# Patient Record
Sex: Male | Born: 1945 | Race: White | Hispanic: No | Smoking: Former smoker
Health system: Southern US, Community
[De-identification: ages and names within clinical notes are randomized; demographics above are authoritative.]

## PROBLEM LIST (undated history)

## (undated) DIAGNOSIS — I350 Nonrheumatic aortic (valve) stenosis: Secondary | ICD-10-CM

## (undated) DIAGNOSIS — J189 Pneumonia, unspecified organism: Secondary | ICD-10-CM

## (undated) DIAGNOSIS — M109 Gout, unspecified: Secondary | ICD-10-CM

## (undated) DIAGNOSIS — I2510 Atherosclerotic heart disease of native coronary artery without angina pectoris: Secondary | ICD-10-CM

## (undated) DIAGNOSIS — E785 Hyperlipidemia, unspecified: Secondary | ICD-10-CM

## (undated) DIAGNOSIS — N189 Chronic kidney disease, unspecified: Secondary | ICD-10-CM

## (undated) DIAGNOSIS — E119 Type 2 diabetes mellitus without complications: Secondary | ICD-10-CM

## (undated) DIAGNOSIS — R0602 Shortness of breath: Secondary | ICD-10-CM

## (undated) DIAGNOSIS — D759 Disease of blood and blood-forming organs, unspecified: Secondary | ICD-10-CM

## (undated) DIAGNOSIS — M1990 Unspecified osteoarthritis, unspecified site: Secondary | ICD-10-CM

## (undated) DIAGNOSIS — I714 Abdominal aortic aneurysm, without rupture, unspecified: Secondary | ICD-10-CM

## (undated) DIAGNOSIS — I8290 Acute embolism and thrombosis of unspecified vein: Secondary | ICD-10-CM

## (undated) DIAGNOSIS — R0600 Dyspnea, unspecified: Secondary | ICD-10-CM

## (undated) DIAGNOSIS — I10 Essential (primary) hypertension: Secondary | ICD-10-CM

## (undated) DIAGNOSIS — I739 Peripheral vascular disease, unspecified: Secondary | ICD-10-CM

## (undated) DIAGNOSIS — J449 Chronic obstructive pulmonary disease, unspecified: Secondary | ICD-10-CM

## (undated) HISTORY — DX: Chronic obstructive pulmonary disease, unspecified: J44.9

## (undated) HISTORY — DX: Nonrheumatic aortic (valve) stenosis: I35.0

## (undated) HISTORY — DX: Gout, unspecified: M10.9

## (undated) HISTORY — DX: Hyperlipidemia, unspecified: E78.5

## (undated) HISTORY — DX: Peripheral vascular disease, unspecified: I73.9

## (undated) HISTORY — PX: SHOULDER SURGERY: SHX246

## (undated) HISTORY — DX: Abdominal aortic aneurysm, without rupture: I71.4

## (undated) HISTORY — PX: BACK SURGERY: SHX140

## (undated) HISTORY — PX: APPENDECTOMY: SHX54

## (undated) HISTORY — DX: Essential (primary) hypertension: I10

## (undated) HISTORY — DX: Unspecified osteoarthritis, unspecified site: M19.90

## (undated) HISTORY — PX: EYE SURGERY: SHX253

## (undated) HISTORY — DX: Acute embolism and thrombosis of unspecified vein: I82.90

## (undated) HISTORY — DX: Abdominal aortic aneurysm, without rupture, unspecified: I71.40

## (undated) HISTORY — PX: HERNIA REPAIR: SHX51

## (undated) HISTORY — DX: Atherosclerotic heart disease of native coronary artery without angina pectoris: I25.10

---

## 1998-08-20 ENCOUNTER — Encounter: Payer: Self-pay | Admitting: Internal Medicine

## 1998-08-20 ENCOUNTER — Ambulatory Visit (HOSPITAL_COMMUNITY): Admission: RE | Admit: 1998-08-20 | Discharge: 1998-08-20 | Payer: Self-pay | Admitting: Internal Medicine

## 1999-10-05 ENCOUNTER — Ambulatory Visit (HOSPITAL_COMMUNITY): Admission: RE | Admit: 1999-10-05 | Discharge: 1999-10-05 | Payer: Self-pay | Admitting: Internal Medicine

## 1999-10-05 ENCOUNTER — Encounter: Payer: Self-pay | Admitting: Internal Medicine

## 1999-10-15 ENCOUNTER — Encounter: Payer: Self-pay | Admitting: Internal Medicine

## 1999-10-15 ENCOUNTER — Ambulatory Visit (HOSPITAL_COMMUNITY): Admission: RE | Admit: 1999-10-15 | Discharge: 1999-10-15 | Payer: Self-pay | Admitting: Internal Medicine

## 2000-04-12 ENCOUNTER — Encounter: Payer: Self-pay | Admitting: General Surgery

## 2000-04-13 ENCOUNTER — Ambulatory Visit (HOSPITAL_COMMUNITY): Admission: RE | Admit: 2000-04-13 | Discharge: 2000-04-13 | Payer: Self-pay | Admitting: General Surgery

## 2001-01-25 ENCOUNTER — Ambulatory Visit (HOSPITAL_COMMUNITY): Admission: RE | Admit: 2001-01-25 | Discharge: 2001-01-25 | Payer: Self-pay | Admitting: Oncology

## 2002-09-26 ENCOUNTER — Encounter: Payer: Self-pay | Admitting: Oncology

## 2002-09-26 ENCOUNTER — Inpatient Hospital Stay (HOSPITAL_COMMUNITY): Admission: EM | Admit: 2002-09-26 | Discharge: 2002-09-27 | Payer: Self-pay | Admitting: Oncology

## 2003-05-15 ENCOUNTER — Encounter: Payer: Self-pay | Admitting: Oncology

## 2003-05-15 ENCOUNTER — Ambulatory Visit (HOSPITAL_COMMUNITY): Admission: RE | Admit: 2003-05-15 | Discharge: 2003-05-15 | Payer: Self-pay | Admitting: Oncology

## 2004-06-08 ENCOUNTER — Ambulatory Visit (HOSPITAL_COMMUNITY): Admission: RE | Admit: 2004-06-08 | Discharge: 2004-06-08 | Payer: Self-pay | Admitting: Internal Medicine

## 2004-07-08 ENCOUNTER — Ambulatory Visit: Payer: Self-pay | Admitting: *Deleted

## 2004-08-11 ENCOUNTER — Encounter: Admission: RE | Admit: 2004-08-11 | Discharge: 2004-08-11 | Payer: Self-pay | Admitting: *Deleted

## 2004-08-12 ENCOUNTER — Ambulatory Visit (HOSPITAL_COMMUNITY): Admission: RE | Admit: 2004-08-12 | Discharge: 2004-08-12 | Payer: Self-pay | Admitting: *Deleted

## 2004-08-12 ENCOUNTER — Ambulatory Visit (HOSPITAL_BASED_OUTPATIENT_CLINIC_OR_DEPARTMENT_OTHER): Admission: RE | Admit: 2004-08-12 | Discharge: 2004-08-12 | Payer: Self-pay | Admitting: *Deleted

## 2004-08-19 ENCOUNTER — Ambulatory Visit: Payer: Self-pay | Admitting: Oncology

## 2004-10-29 ENCOUNTER — Ambulatory Visit: Payer: Self-pay | Admitting: Oncology

## 2004-12-23 ENCOUNTER — Ambulatory Visit: Payer: Self-pay | Admitting: Oncology

## 2004-12-30 ENCOUNTER — Ambulatory Visit: Payer: Self-pay | Admitting: Internal Medicine

## 2005-03-03 ENCOUNTER — Ambulatory Visit: Payer: Self-pay | Admitting: Oncology

## 2005-04-23 ENCOUNTER — Ambulatory Visit: Payer: Self-pay | Admitting: Oncology

## 2005-06-30 ENCOUNTER — Ambulatory Visit: Payer: Self-pay | Admitting: Oncology

## 2005-08-13 ENCOUNTER — Ambulatory Visit: Payer: Self-pay | Admitting: Internal Medicine

## 2005-08-18 ENCOUNTER — Ambulatory Visit: Payer: Self-pay | Admitting: Oncology

## 2005-10-27 ENCOUNTER — Ambulatory Visit: Payer: Self-pay | Admitting: Oncology

## 2005-12-22 ENCOUNTER — Ambulatory Visit: Payer: Self-pay | Admitting: Oncology

## 2006-01-20 LAB — PROTIME-INR

## 2006-02-15 ENCOUNTER — Ambulatory Visit: Payer: Self-pay | Admitting: Oncology

## 2006-02-17 LAB — PROTIME-INR
INR: 1.8 — ABNORMAL LOW (ref 2.00–3.50)
Protime: 16.5 Seconds — ABNORMAL HIGH (ref 10.6–13.4)

## 2006-03-02 LAB — PROTHROMBIN TIME
INR: 1.2 (ref 0.0–1.5)
Prothrombin Time: 15.8 seconds — ABNORMAL HIGH (ref 11.6–15.2)

## 2006-03-07 LAB — PROTIME-INR
INR: 2.2 (ref 2.00–3.50)
Protime: 18.2 Seconds — ABNORMAL HIGH (ref 10.6–13.4)

## 2006-03-11 LAB — PROTIME-INR
INR: 1.9 — ABNORMAL LOW (ref 2.00–3.50)
Protime: 17.1 Seconds — ABNORMAL HIGH (ref 10.6–13.4)

## 2006-04-21 ENCOUNTER — Ambulatory Visit: Payer: Self-pay | Admitting: Oncology

## 2006-04-21 LAB — PROTIME-INR: Protime: 22.8 Seconds — ABNORMAL HIGH (ref 10.6–13.4)

## 2006-04-28 LAB — PROTHROMBIN TIME
INR: 3.7 — ABNORMAL HIGH (ref 0.0–1.5)
Prothrombin Time: 36.3 seconds — ABNORMAL HIGH (ref 11.6–15.2)

## 2006-04-28 LAB — PROTIME-INR

## 2006-05-11 LAB — PROTIME-INR
INR: 3 (ref 2.00–3.50)
Protime: 20.9 Seconds — ABNORMAL HIGH (ref 10.6–13.4)

## 2006-05-26 LAB — PROTIME-INR
INR: 2.9 (ref 2.00–3.50)
Protime: 20.5 Seconds — ABNORMAL HIGH (ref 10.6–13.4)

## 2006-06-08 ENCOUNTER — Ambulatory Visit: Payer: Self-pay | Admitting: Oncology

## 2006-06-09 LAB — PROTIME-INR

## 2006-07-14 LAB — PROTIME-INR

## 2006-08-16 ENCOUNTER — Ambulatory Visit: Payer: Self-pay | Admitting: Oncology

## 2006-08-18 LAB — PROTIME-INR: INR: 2.2 (ref 2.00–3.50)

## 2006-09-13 ENCOUNTER — Ambulatory Visit: Payer: Self-pay | Admitting: Oncology

## 2006-10-13 LAB — PROTIME-INR: INR: 2.6 (ref 2.00–3.50)

## 2006-11-07 ENCOUNTER — Ambulatory Visit: Payer: Self-pay | Admitting: Oncology

## 2006-11-10 LAB — PROTIME-INR: INR: 2.6 (ref 2.00–3.50)

## 2007-01-03 ENCOUNTER — Ambulatory Visit: Payer: Self-pay | Admitting: Oncology

## 2007-01-05 LAB — PROTIME-INR

## 2007-02-02 LAB — PROTIME-INR
INR: 2.3 (ref 2.00–3.50)
Protime: 27.6 Seconds — ABNORMAL HIGH (ref 10.6–13.4)

## 2007-02-28 ENCOUNTER — Ambulatory Visit: Payer: Self-pay | Admitting: Oncology

## 2007-03-02 LAB — PROTIME-INR: Protime: 21.6 Seconds — ABNORMAL HIGH (ref 10.6–13.4)

## 2007-03-30 LAB — PROTIME-INR: INR: 1.5 — ABNORMAL LOW (ref 2.00–3.50)

## 2007-04-25 ENCOUNTER — Ambulatory Visit: Payer: Self-pay | Admitting: Oncology

## 2007-04-26 LAB — PROTIME-INR: INR: 2.1 (ref 2.00–3.50)

## 2007-05-22 LAB — PROTIME-INR: Protime: 16.8 Seconds — ABNORMAL HIGH (ref 10.6–13.4)

## 2007-06-16 ENCOUNTER — Ambulatory Visit: Payer: Self-pay | Admitting: Internal Medicine

## 2007-07-19 LAB — PROTIME-INR
INR: 2.1 (ref 2.00–3.50)
Protime: 25.2 Seconds — ABNORMAL HIGH (ref 10.6–13.4)

## 2007-08-14 ENCOUNTER — Ambulatory Visit: Payer: Self-pay | Admitting: Internal Medicine

## 2007-08-16 LAB — PROTIME-INR
INR: 1.8 — ABNORMAL LOW (ref 2.00–3.50)
Protime: 21.6 Seconds — ABNORMAL HIGH (ref 10.6–13.4)

## 2007-09-13 LAB — PROTIME-INR

## 2007-10-09 ENCOUNTER — Ambulatory Visit: Payer: Self-pay | Admitting: Internal Medicine

## 2007-10-11 LAB — PROTIME-INR

## 2007-11-08 LAB — PROTIME-INR

## 2007-11-15 ENCOUNTER — Ambulatory Visit (HOSPITAL_COMMUNITY): Admission: RE | Admit: 2007-11-15 | Discharge: 2007-11-15 | Payer: Self-pay | Admitting: Cardiology

## 2007-12-04 ENCOUNTER — Ambulatory Visit: Payer: Self-pay | Admitting: Internal Medicine

## 2007-12-06 LAB — PROTIME-INR: Protime: 42 Seconds — ABNORMAL HIGH (ref 10.6–13.4)

## 2008-01-03 LAB — PROTIME-INR
INR: 3.3 (ref 2.00–3.50)
Protime: 39.6 Seconds — ABNORMAL HIGH (ref 10.6–13.4)

## 2008-01-18 ENCOUNTER — Ambulatory Visit: Payer: Self-pay | Admitting: Internal Medicine

## 2008-01-22 LAB — PROTIME-INR

## 2008-02-21 LAB — PROTIME-INR
INR: 2.1 (ref 2.00–3.50)
Protime: 25.2 Seconds — ABNORMAL HIGH (ref 10.6–13.4)

## 2008-03-18 ENCOUNTER — Ambulatory Visit: Payer: Self-pay | Admitting: Internal Medicine

## 2008-03-20 LAB — PROTIME-INR
INR: 2.3 (ref 2.00–3.50)
Protime: 27.6 Seconds — ABNORMAL HIGH (ref 10.6–13.4)

## 2008-05-10 ENCOUNTER — Ambulatory Visit: Payer: Self-pay | Admitting: Internal Medicine

## 2008-06-12 LAB — PROTIME-INR: Protime: 42 Seconds — ABNORMAL HIGH (ref 10.6–13.4)

## 2008-06-19 LAB — PROTIME-INR
INR: 2.7 (ref 2.00–3.50)
Protime: 32.4 Seconds — ABNORMAL HIGH (ref 10.6–13.4)

## 2008-07-08 ENCOUNTER — Ambulatory Visit: Payer: Self-pay | Admitting: Internal Medicine

## 2008-07-10 LAB — PROTIME-INR: INR: 2.5 (ref 2.00–3.50)

## 2008-08-19 LAB — PROTIME-INR
INR: 2 (ref 2.00–3.50)
Protime: 24 s — ABNORMAL HIGH (ref 10.6–13.4)

## 2008-09-10 ENCOUNTER — Ambulatory Visit: Payer: Self-pay | Admitting: Internal Medicine

## 2008-09-16 LAB — PROTIME-INR
INR: 2.8 (ref 2.00–3.50)
Protime: 33.6 Seconds — ABNORMAL HIGH (ref 10.6–13.4)

## 2008-10-28 ENCOUNTER — Ambulatory Visit: Payer: Self-pay | Admitting: Internal Medicine

## 2008-10-28 LAB — PROTIME-INR: INR: 3.3 (ref 2.00–3.50)

## 2008-11-13 LAB — PROTIME-INR: INR: 1.9 — ABNORMAL LOW (ref 2.00–3.50)

## 2008-12-11 LAB — PROTIME-INR: INR: 2.4 (ref 2.00–3.50)

## 2009-01-06 ENCOUNTER — Ambulatory Visit: Payer: Self-pay | Admitting: Internal Medicine

## 2009-01-08 LAB — PROTIME-INR: INR: 2.6 (ref 2.00–3.50)

## 2009-02-05 LAB — PROTIME-INR: Protime: 31.2 Seconds — ABNORMAL HIGH (ref 10.6–13.4)

## 2009-03-03 ENCOUNTER — Ambulatory Visit: Payer: Self-pay | Admitting: Internal Medicine

## 2009-03-05 LAB — PROTIME-INR
INR: 1.9 — ABNORMAL LOW (ref 2.00–3.50)
Protime: 22.8 Seconds — ABNORMAL HIGH (ref 10.6–13.4)

## 2009-04-02 LAB — PROTIME-INR

## 2009-04-28 ENCOUNTER — Ambulatory Visit: Payer: Self-pay | Admitting: Internal Medicine

## 2009-04-30 LAB — PROTIME-INR

## 2009-05-26 ENCOUNTER — Ambulatory Visit: Payer: Self-pay | Admitting: Internal Medicine

## 2009-05-26 LAB — PROTIME-INR: INR: 2.8 (ref 2.00–3.50)

## 2009-06-25 ENCOUNTER — Ambulatory Visit: Payer: Self-pay | Admitting: Internal Medicine

## 2009-06-25 LAB — CBC WITH DIFFERENTIAL/PLATELET
Basophils Absolute: 0 10*3/uL (ref 0.0–0.1)
EOS%: 3.2 % (ref 0.0–7.0)
Eosinophils Absolute: 0.3 10*3/uL (ref 0.0–0.5)
HCT: 38.9 % (ref 38.4–49.9)
HGB: 13.6 g/dL (ref 13.0–17.1)
LYMPH%: 16.9 % (ref 14.0–49.0)
MCH: 32.4 pg (ref 27.2–33.4)
MCV: 92.3 fL (ref 79.3–98.0)
MONO%: 9 % (ref 0.0–14.0)
NEUT#: 5.7 10*3/uL (ref 1.5–6.5)
NEUT%: 70.6 % (ref 39.0–75.0)
Platelets: 279 10*3/uL (ref 140–400)
RDW: 17.7 % — ABNORMAL HIGH (ref 11.0–14.6)

## 2009-06-25 LAB — COMPREHENSIVE METABOLIC PANEL
AST: 10 U/L (ref 0–37)
Albumin: 4.2 g/dL (ref 3.5–5.2)
BUN: 12 mg/dL (ref 6–23)
CO2: 15 mEq/L — ABNORMAL LOW (ref 19–32)
Calcium: 8.6 mg/dL (ref 8.4–10.5)
Chloride: 109 mEq/L (ref 96–112)
Creatinine, Ser: 1.28 mg/dL (ref 0.40–1.50)
Glucose, Bld: 78 mg/dL (ref 70–99)
Potassium: 4.7 mEq/L (ref 3.5–5.3)

## 2009-06-25 LAB — TSH: TSH: 2.226 u[IU]/mL (ref 0.350–4.500)

## 2009-06-25 LAB — PROTIME-INR: INR: 3.1 (ref 2.00–3.50)

## 2009-07-23 LAB — BASIC METABOLIC PANEL
CO2: 20 mEq/L (ref 19–32)
Chloride: 108 mEq/L (ref 96–112)
Creatinine, Ser: 1.06 mg/dL (ref 0.40–1.50)
Potassium: 4.2 mEq/L (ref 3.5–5.3)

## 2009-07-23 LAB — PROTIME-INR: INR: 1.8 — ABNORMAL LOW (ref 2.00–3.50)

## 2009-08-18 ENCOUNTER — Ambulatory Visit: Payer: Self-pay | Admitting: Internal Medicine

## 2009-08-20 LAB — PROTIME-INR: INR: 2.8 (ref 2.00–3.50)

## 2009-09-17 ENCOUNTER — Ambulatory Visit: Payer: Self-pay | Admitting: Internal Medicine

## 2009-09-17 LAB — PROTIME-INR
INR: 2.9 (ref 2.00–3.50)
Protime: 34.8 Seconds — ABNORMAL HIGH (ref 10.6–13.4)

## 2009-10-20 ENCOUNTER — Ambulatory Visit: Payer: Self-pay | Admitting: Internal Medicine

## 2009-10-22 LAB — PROTIME-INR: Protime: 19.2 Seconds — ABNORMAL HIGH (ref 10.6–13.4)

## 2009-11-18 LAB — CBC WITH DIFFERENTIAL/PLATELET
Basophils Absolute: 0 10*3/uL (ref 0.0–0.1)
EOS%: 3.3 % (ref 0.0–7.0)
LYMPH%: 8.8 % — ABNORMAL LOW (ref 14.0–49.0)
MCH: 31.3 pg (ref 27.2–33.4)
MCV: 90.9 fL (ref 79.3–98.0)
MONO%: 10.3 % (ref 0.0–14.0)
Platelets: 222 10*3/uL (ref 140–400)
RBC: 4.64 10*6/uL (ref 4.20–5.82)
RDW: 15.7 % — ABNORMAL HIGH (ref 11.0–14.6)
nRBC: 0 % (ref 0–0)

## 2009-11-18 LAB — PROTIME-INR: Protime: 20.4 Seconds — ABNORMAL HIGH (ref 10.6–13.4)

## 2009-11-23 ENCOUNTER — Inpatient Hospital Stay (HOSPITAL_COMMUNITY): Admission: EM | Admit: 2009-11-23 | Discharge: 2009-11-26 | Payer: Self-pay | Admitting: Emergency Medicine

## 2009-12-01 ENCOUNTER — Ambulatory Visit: Payer: Self-pay | Admitting: Internal Medicine

## 2009-12-01 LAB — PROTIME-INR
INR: 2.9 (ref 2.00–3.50)
Protime: 34.8 Seconds — ABNORMAL HIGH (ref 10.6–13.4)

## 2009-12-08 LAB — PROTIME-INR
INR: 1.3 — ABNORMAL LOW (ref 2.00–3.50)
Protime: 15.6 Seconds — ABNORMAL HIGH (ref 10.6–13.4)

## 2009-12-17 LAB — PROTIME-INR
INR: 2.3 (ref 2.00–3.50)
Protime: 27.6 Seconds — ABNORMAL HIGH (ref 10.6–13.4)

## 2010-01-19 ENCOUNTER — Ambulatory Visit: Payer: Self-pay | Admitting: Internal Medicine

## 2010-01-21 LAB — PROTIME-INR
INR: 2.6 (ref 2.00–3.50)
Protime: 31.2 Seconds — ABNORMAL HIGH (ref 10.6–13.4)

## 2010-02-20 ENCOUNTER — Ambulatory Visit: Payer: Self-pay | Admitting: Internal Medicine

## 2010-02-20 ENCOUNTER — Inpatient Hospital Stay (HOSPITAL_COMMUNITY): Admission: EM | Admit: 2010-02-20 | Discharge: 2010-02-21 | Payer: Self-pay | Admitting: Emergency Medicine

## 2010-02-20 ENCOUNTER — Encounter (INDEPENDENT_AMBULATORY_CARE_PROVIDER_SITE_OTHER): Payer: Self-pay | Admitting: Internal Medicine

## 2010-02-25 ENCOUNTER — Ambulatory Visit: Payer: Self-pay | Admitting: Internal Medicine

## 2010-03-10 LAB — PROTIME-INR

## 2010-03-18 LAB — PROTIME-INR
INR: 2.5 (ref 2.00–3.50)
Protime: 30 Seconds — ABNORMAL HIGH (ref 10.6–13.4)

## 2010-04-17 ENCOUNTER — Ambulatory Visit: Payer: Self-pay | Admitting: Internal Medicine

## 2010-05-06 LAB — PROTIME-INR: INR: 1.3 — ABNORMAL LOW (ref 2.00–3.50)

## 2010-05-19 ENCOUNTER — Ambulatory Visit: Payer: Self-pay | Admitting: Internal Medicine

## 2010-06-10 LAB — PROTIME-INR: Protime: 38.4 Seconds — ABNORMAL HIGH (ref 10.6–13.4)

## 2010-06-24 ENCOUNTER — Ambulatory Visit: Payer: Self-pay | Admitting: Internal Medicine

## 2010-06-24 LAB — PROTIME-INR

## 2010-07-15 LAB — PROTIME-INR
INR: 3.3 (ref 2.00–3.50)
Protime: 39.6 Seconds — ABNORMAL HIGH (ref 10.6–13.4)

## 2010-08-10 ENCOUNTER — Ambulatory Visit: Payer: Self-pay | Admitting: Internal Medicine

## 2010-08-12 LAB — PROTIME-INR
INR: 3.2 (ref 2.00–3.50)
Protime: 38.4 Seconds — ABNORMAL HIGH (ref 10.6–13.4)

## 2010-09-09 ENCOUNTER — Ambulatory Visit: Payer: Self-pay | Admitting: Internal Medicine

## 2010-09-09 LAB — PROTHROMBIN TIME
INR: 3.68 — ABNORMAL HIGH (ref <1.50)
Prothrombin Time: 36.5 seconds — ABNORMAL HIGH (ref 11.6–15.2)

## 2010-09-09 LAB — PROTIME-INR

## 2010-09-16 LAB — PROTIME-INR

## 2010-10-07 LAB — PROTIME-INR

## 2010-10-08 ENCOUNTER — Ambulatory Visit: Payer: Self-pay | Admitting: Internal Medicine

## 2010-10-09 LAB — PROTIME-INR

## 2010-10-23 LAB — PROTIME-INR
INR: 2.7 (ref 2.00–3.50)
Protime: 32.4 Seconds — ABNORMAL HIGH (ref 10.6–13.4)

## 2010-11-08 ENCOUNTER — Encounter: Payer: Self-pay | Admitting: Cardiology

## 2010-11-13 ENCOUNTER — Ambulatory Visit: Payer: Self-pay | Admitting: Oncology

## 2010-11-17 LAB — PROTIME-INR: INR: 2.3 (ref 2.00–3.50)
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
## 2010-12-16 ENCOUNTER — Encounter (HOSPITAL_BASED_OUTPATIENT_CLINIC_OR_DEPARTMENT_OTHER): Payer: Self-pay | Admitting: Oncology

## 2010-12-16 ENCOUNTER — Other Ambulatory Visit: Payer: Self-pay | Admitting: Oncology

## 2010-12-16 DIAGNOSIS — Z7901 Long term (current) use of anticoagulants: Secondary | ICD-10-CM

## 2010-12-16 DIAGNOSIS — I10 Essential (primary) hypertension: Secondary | ICD-10-CM

## 2010-12-16 DIAGNOSIS — R5381 Other malaise: Secondary | ICD-10-CM

## 2010-12-16 DIAGNOSIS — Z86718 Personal history of other venous thrombosis and embolism: Secondary | ICD-10-CM

## 2010-12-16 LAB — PROTIME-INR

## 2011-01-05 LAB — COMPREHENSIVE METABOLIC PANEL
ALT: 12 U/L (ref 0–53)
ALT: 9 U/L (ref 0–53)
AST: 12 U/L (ref 0–37)
AST: 13 U/L (ref 0–37)
Albumin: 3.5 g/dL (ref 3.5–5.2)
Albumin: 4.3 g/dL (ref 3.5–5.2)
Alkaline Phosphatase: 84 U/L (ref 39–117)
CO2: 18 mEq/L — ABNORMAL LOW (ref 19–32)
Chloride: 103 mEq/L (ref 96–112)
Creatinine, Ser: 1.67 mg/dL — ABNORMAL HIGH (ref 0.4–1.5)
GFR calc Af Amer: 57 mL/min — ABNORMAL LOW (ref >60)
GFR calc non Af Amer: 42 mL/min — ABNORMAL LOW (ref >60)
Glucose, Bld: 110 mg/dL — ABNORMAL HIGH (ref 70–99)
Potassium: 4.4 mEq/L (ref 3.5–5.1)
Potassium: 4.6 mEq/L (ref 3.5–5.1)
Sodium: 132 mEq/L — ABNORMAL LOW (ref 135–145)
Sodium: 136 mEq/L (ref 135–145)
Total Bilirubin: 1.2 mg/dL (ref 0.3–1.2)
Total Protein: 6.7 g/dL (ref 6.0–8.3)

## 2011-01-05 LAB — URINALYSIS, ROUTINE W REFLEX MICROSCOPIC
Bilirubin Urine: NEGATIVE
Glucose, UA: NEGATIVE mg/dL
Nitrite: NEGATIVE
Specific Gravity, Urine: 1.013 (ref 1.005–1.030)
pH: 6 (ref 5.0–8.0)

## 2011-01-05 LAB — TROPONIN I

## 2011-01-05 LAB — CBC
HCT: 43.4 % (ref 39.0–52.0)
Hemoglobin: 15.3 g/dL (ref 13.0–17.0)
MCHC: 35 g/dL (ref 30.0–36.0)
MCV: 98 fL (ref 78.0–100.0)
RBC: 3.46 MIL/uL — ABNORMAL LOW (ref 4.22–5.81)
RBC: 4.46 MIL/uL (ref 4.22–5.81)
RDW: 15.5 % (ref 11.5–15.5)
WBC: 12.7 10*3/uL — ABNORMAL HIGH (ref 4.0–10.5)

## 2011-01-05 LAB — CARDIAC PANEL(CRET KIN+CKTOT+MB+TROPI)
Relative Index: INVALID (ref 0.0–2.5)
Total CK: 36 U/L (ref 7–232)
Troponin I: <0.01 ng/mL (ref 0.00–0.06)

## 2011-01-05 LAB — DIFFERENTIAL
Basophils Absolute: 0 10*3/uL (ref 0.0–0.1)
Basophils Relative: 0 % (ref 0–1)
Eosinophils Relative: 2 % (ref 0–5)
Monocytes Absolute: 0.9 10*3/uL (ref 0.1–1.0)
Monocytes Relative: 7 % (ref 3–12)
Neutro Abs: 10 10*3/uL — ABNORMAL HIGH (ref 1.7–7.7)

## 2011-01-05 LAB — ETHANOL

## 2011-01-05 LAB — RAPID URINE DRUG SCREEN, HOSP PERFORMED
Amphetamines: NOT DETECTED
Benzodiazepines: NOT DETECTED
Cocaine: NOT DETECTED

## 2011-01-05 LAB — CULTURE, BLOOD (ROUTINE X 2): Culture: NO GROWTH

## 2011-01-05 LAB — CK TOTAL AND CKMB (NOT AT ARMC): CK, MB: 1.7 ng/mL (ref 0.3–4.0)

## 2011-01-05 LAB — PROTIME-INR: Prothrombin Time: 20 seconds — ABNORMAL HIGH (ref 11.6–15.2)

## 2011-01-08 LAB — COMPREHENSIVE METABOLIC PANEL
Albumin: 3.7 g/dL (ref 3.5–5.2)
BUN: 29 mg/dL — ABNORMAL HIGH (ref 6–23)
Calcium: 8.9 mg/dL (ref 8.4–10.5)
Creatinine, Ser: 1.61 mg/dL — ABNORMAL HIGH (ref 0.4–1.5)
Glucose, Bld: 93 mg/dL (ref 70–99)
Total Protein: 8 g/dL (ref 6.0–8.3)

## 2011-01-08 LAB — CBC
HCT: 38 % — ABNORMAL LOW (ref 39.0–52.0)
HCT: 45 % (ref 39.0–52.0)
MCHC: 34.6 g/dL (ref 30.0–36.0)
MCHC: 35.1 g/dL (ref 30.0–36.0)
MCV: 91.9 fL (ref 78.0–100.0)
MCV: 92.2 fL (ref 78.0–100.0)
MCV: 93.1 fL (ref 78.0–100.0)
Platelets: 181 10*3/uL (ref 150–400)
Platelets: 197 10*3/uL (ref 150–400)
Platelets: 205 10*3/uL (ref 150–400)
RBC: 4.13 MIL/uL — ABNORMAL LOW (ref 4.22–5.81)
RDW: 16.9 % — ABNORMAL HIGH (ref 11.5–15.5)
RDW: 17 % — ABNORMAL HIGH (ref 11.5–15.5)
WBC: 4.3 10*3/uL (ref 4.0–10.5)

## 2011-01-08 LAB — BASIC METABOLIC PANEL
BUN: 18 mg/dL (ref 6–23)
BUN: 8 mg/dL (ref 6–23)
CO2: 23 mEq/L (ref 19–32)
Calcium: 8.3 mg/dL — ABNORMAL LOW (ref 8.4–10.5)
Calcium: 9 mg/dL (ref 8.4–10.5)
Chloride: 104 mEq/L (ref 96–112)
Chloride: 105 mEq/L (ref 96–112)
Creatinine, Ser: 0.8 mg/dL (ref 0.4–1.5)
Creatinine, Ser: 1.18 mg/dL (ref 0.4–1.5)
GFR calc Af Amer: >60 mL/min (ref >60)
GFR calc Af Amer: >60 mL/min (ref >60)
GFR calc non Af Amer: >60 mL/min (ref >60)
GFR calc non Af Amer: >60 mL/min (ref >60)
GFR calc non Af Amer: >60 mL/min (ref >60)
Glucose, Bld: 72 mg/dL (ref 70–99)
Potassium: 4.2 mEq/L (ref 3.5–5.1)
Sodium: 131 mEq/L — ABNORMAL LOW (ref 135–145)
Sodium: 135 mEq/L (ref 135–145)

## 2011-01-08 LAB — RAPID STREP SCREEN (MED CTR MEBANE ONLY): Streptococcus, Group A Screen (Direct): NEGATIVE

## 2011-01-08 LAB — GLUCOSE, CAPILLARY

## 2011-01-08 LAB — PROTIME-INR
INR: 1.89 — ABNORMAL HIGH (ref 0.00–1.49)
INR: 2.78 — ABNORMAL HIGH (ref 0.00–1.49)
INR: 3.42 — ABNORMAL HIGH (ref 0.00–1.49)
Prothrombin Time: 21.5 seconds — ABNORMAL HIGH (ref 11.6–15.2)
Prothrombin Time: 29.1 seconds — ABNORMAL HIGH (ref 11.6–15.2)
Prothrombin Time: 34.2 seconds — ABNORMAL HIGH (ref 11.6–15.2)

## 2011-01-08 LAB — URINALYSIS, ROUTINE W REFLEX MICROSCOPIC
Glucose, UA: NEGATIVE mg/dL
Hgb urine dipstick: NEGATIVE
Ketones, ur: NEGATIVE mg/dL
Leukocytes, UA: NEGATIVE
Protein, ur: 30 mg/dL — AB
Urobilinogen, UA: 1 mg/dL (ref 0.0–1.0)

## 2011-01-08 LAB — URINE MICROSCOPIC-ADD ON

## 2011-01-08 LAB — APTT: aPTT: 59 seconds — ABNORMAL HIGH (ref 24–37)

## 2011-01-08 LAB — OSMOLALITY, URINE: Osmolality, Ur: 285 mOsm/kg — ABNORMAL LOW (ref 390–1090)

## 2011-01-08 LAB — URINE CULTURE
Colony Count: NO GROWTH
Culture: NO GROWTH

## 2011-01-08 LAB — DIFFERENTIAL
Lymphocytes Relative: 10 % — ABNORMAL LOW (ref 12–46)
Monocytes Absolute: 1 10*3/uL (ref 0.1–1.0)
Monocytes Relative: 12 % (ref 3–12)
Neutro Abs: 6.7 10*3/uL (ref 1.7–7.7)
Neutrophils Relative %: 79 % — ABNORMAL HIGH (ref 43–77)

## 2011-01-08 LAB — POCT CARDIAC MARKERS
CKMB, poc: 2.2 ng/mL (ref 1.0–8.0)
Myoglobin, poc: 395 ng/mL (ref 12–200)

## 2011-01-13 ENCOUNTER — Other Ambulatory Visit: Payer: Self-pay | Admitting: Oncology

## 2011-01-13 ENCOUNTER — Encounter (HOSPITAL_BASED_OUTPATIENT_CLINIC_OR_DEPARTMENT_OTHER): Payer: Medicare Other | Admitting: Oncology

## 2011-01-13 DIAGNOSIS — Z7901 Long term (current) use of anticoagulants: Secondary | ICD-10-CM

## 2011-01-13 DIAGNOSIS — Z86718 Personal history of other venous thrombosis and embolism: Secondary | ICD-10-CM

## 2011-01-13 DIAGNOSIS — I10 Essential (primary) hypertension: Secondary | ICD-10-CM

## 2011-01-13 DIAGNOSIS — R5381 Other malaise: Secondary | ICD-10-CM

## 2011-01-13 LAB — PROTIME-INR: Protime: 24 Seconds — ABNORMAL HIGH (ref 10.6–13.4)

## 2011-02-10 ENCOUNTER — Other Ambulatory Visit: Payer: Self-pay | Admitting: Oncology

## 2011-02-10 ENCOUNTER — Encounter (HOSPITAL_BASED_OUTPATIENT_CLINIC_OR_DEPARTMENT_OTHER): Payer: Medicare Other | Admitting: Oncology

## 2011-02-10 DIAGNOSIS — I10 Essential (primary) hypertension: Secondary | ICD-10-CM

## 2011-02-10 DIAGNOSIS — Z86718 Personal history of other venous thrombosis and embolism: Secondary | ICD-10-CM

## 2011-02-10 DIAGNOSIS — Z7901 Long term (current) use of anticoagulants: Secondary | ICD-10-CM

## 2011-02-10 DIAGNOSIS — R5381 Other malaise: Secondary | ICD-10-CM

## 2011-02-10 LAB — PROTIME-INR: INR: 2.1 (ref 2.00–3.50)

## 2011-03-05 NOTE — Op Note (Signed)
Tony Zhang, Tony Zhang                 ACCOUNT NO.:  [id number]   MEDICAL RECORD NO.:  [id number]          PATIENT TYPE:  AMB   LOCATION:  DSC                          FACILITY:  [facility]   PHYSICIAN:  Vikki Ports, MDDATE OF BIRTH:  1946/02/14   DATE OF PROCEDURE:  08/12/2004  DATE OF DISCHARGE:                                 OPERATIVE REPORT   PREOPERATIVE DIAGNOSIS:  Fistula in ano.  Bilateral epidermoid buttock  subcutaneous masses.   POSTOPERATIVE DIAGNOSIS:  Epidermoid cyst of the left and right buttock and  no evidence of fistula in ano.   PROCEDURE:  Excision of right and left buttock epidermoid cyst and  examination of the rectum and anus under anesthesia.   SURGEON:  Vikki Ports, M.D.   ANESTHESIA:  General.   DESCRIPTION OF PROCEDURE:  The patient was taken to the operating room and  placed in the supine position.  After adequate general anesthesia was  induced using the endotracheal tube, the patient was placed in a prone  jackknife position.  Buttocks, rectal, and perianal prep were undertaken and  it was then draped in the usual sterile fashion.  Elliptical incision was  made around the previously inflamed epidermoid cyst of the right superior  buttock adjacent to the gluteal cleft.  The tissue was very fibrotic and  there was a lot of sebum, but I think that the cyst was removed in its  entirety.  Adequate hemostasis was ensured and the skin was closed with a  subcuticular 4-0 Monocryl.  I turned my attention to the left buttock where  I saw a larger 4 cm mass subcutaneously.  I made a longitudinally oriented  incision and dissected down onto what was obviously an epidermoid cyst.  This was excised in its entirety down to the capsule.  Adequate hemostasis  was assured and the skin was closed using a subcuticular 4-0 Monocryl.   I then turned my attention to the perianal region after probing particularly  in the posterior midline and the left lateral  portion, I saw no fistulous  openings.  I did anoscopy and did not see fistulous openings and did digital  rectal examination, but could not show any evidence of fistula, therefore I  stopped the procedure.      Gaylyn Rong   KRH/MEDQ  D:  08/12/2004  T:  08/13/2004  Job:  [id number]

## 2011-03-10 ENCOUNTER — Other Ambulatory Visit: Payer: Self-pay | Admitting: Oncology

## 2011-03-10 ENCOUNTER — Encounter (HOSPITAL_BASED_OUTPATIENT_CLINIC_OR_DEPARTMENT_OTHER): Payer: Medicare Other | Admitting: Oncology

## 2011-03-10 DIAGNOSIS — I10 Essential (primary) hypertension: Secondary | ICD-10-CM

## 2011-03-10 DIAGNOSIS — Z86718 Personal history of other venous thrombosis and embolism: Secondary | ICD-10-CM

## 2011-03-10 DIAGNOSIS — Z7901 Long term (current) use of anticoagulants: Secondary | ICD-10-CM

## 2011-03-10 DIAGNOSIS — R5381 Other malaise: Secondary | ICD-10-CM

## 2011-03-10 LAB — PROTIME-INR
INR: 1.9 — ABNORMAL LOW (ref 2.00–3.50)
Protime: 22.8 Seconds — ABNORMAL HIGH (ref 10.6–13.4)

## 2011-04-07 ENCOUNTER — Other Ambulatory Visit: Payer: Self-pay | Admitting: Oncology

## 2011-04-07 ENCOUNTER — Encounter (HOSPITAL_BASED_OUTPATIENT_CLINIC_OR_DEPARTMENT_OTHER): Payer: Medicare Other | Admitting: Oncology

## 2011-04-07 DIAGNOSIS — I10 Essential (primary) hypertension: Secondary | ICD-10-CM

## 2011-04-07 DIAGNOSIS — Z86718 Personal history of other venous thrombosis and embolism: Secondary | ICD-10-CM

## 2011-04-07 DIAGNOSIS — Z7901 Long term (current) use of anticoagulants: Secondary | ICD-10-CM

## 2011-04-07 DIAGNOSIS — R5383 Other fatigue: Secondary | ICD-10-CM

## 2011-04-07 LAB — PROTIME-INR: INR: 2.1 (ref 2.00–3.50)

## 2011-05-05 ENCOUNTER — Other Ambulatory Visit: Payer: Self-pay | Admitting: Oncology

## 2011-05-05 ENCOUNTER — Encounter (HOSPITAL_BASED_OUTPATIENT_CLINIC_OR_DEPARTMENT_OTHER): Payer: Medicare Other | Admitting: Oncology

## 2011-05-05 DIAGNOSIS — Z86718 Personal history of other venous thrombosis and embolism: Secondary | ICD-10-CM

## 2011-05-05 DIAGNOSIS — Z7901 Long term (current) use of anticoagulants: Secondary | ICD-10-CM

## 2011-05-05 DIAGNOSIS — R5381 Other malaise: Secondary | ICD-10-CM

## 2011-05-05 DIAGNOSIS — I10 Essential (primary) hypertension: Secondary | ICD-10-CM

## 2011-06-02 ENCOUNTER — Other Ambulatory Visit: Payer: Self-pay | Admitting: Oncology

## 2011-06-02 ENCOUNTER — Encounter (HOSPITAL_BASED_OUTPATIENT_CLINIC_OR_DEPARTMENT_OTHER): Payer: Medicare Other | Admitting: Oncology

## 2011-06-02 DIAGNOSIS — Z7901 Long term (current) use of anticoagulants: Secondary | ICD-10-CM

## 2011-06-02 DIAGNOSIS — I10 Essential (primary) hypertension: Secondary | ICD-10-CM

## 2011-06-02 DIAGNOSIS — R5381 Other malaise: Secondary | ICD-10-CM

## 2011-06-02 DIAGNOSIS — Z5181 Encounter for therapeutic drug level monitoring: Secondary | ICD-10-CM

## 2011-06-02 LAB — PROTIME-INR: Protime: 25.2 Seconds — ABNORMAL HIGH (ref 10.6–13.4)

## 2011-06-30 ENCOUNTER — Other Ambulatory Visit: Payer: Self-pay | Admitting: Oncology

## 2011-06-30 ENCOUNTER — Encounter (HOSPITAL_BASED_OUTPATIENT_CLINIC_OR_DEPARTMENT_OTHER): Payer: Medicare Other | Admitting: Oncology

## 2011-06-30 DIAGNOSIS — I10 Essential (primary) hypertension: Secondary | ICD-10-CM

## 2011-06-30 DIAGNOSIS — Z86718 Personal history of other venous thrombosis and embolism: Secondary | ICD-10-CM

## 2011-06-30 DIAGNOSIS — R5383 Other fatigue: Secondary | ICD-10-CM

## 2011-06-30 DIAGNOSIS — Z7901 Long term (current) use of anticoagulants: Secondary | ICD-10-CM

## 2011-06-30 LAB — PROTIME-INR
INR: 2 (ref 2.00–3.50)
Protime: 24 Seconds — ABNORMAL HIGH (ref 10.6–13.4)

## 2011-07-28 ENCOUNTER — Encounter (HOSPITAL_BASED_OUTPATIENT_CLINIC_OR_DEPARTMENT_OTHER): Payer: Medicare Other | Admitting: Oncology

## 2011-07-28 ENCOUNTER — Other Ambulatory Visit: Payer: Self-pay | Admitting: Oncology

## 2011-07-28 DIAGNOSIS — Z7901 Long term (current) use of anticoagulants: Secondary | ICD-10-CM

## 2011-07-28 DIAGNOSIS — Z86718 Personal history of other venous thrombosis and embolism: Secondary | ICD-10-CM

## 2011-07-28 DIAGNOSIS — I10 Essential (primary) hypertension: Secondary | ICD-10-CM

## 2011-07-28 DIAGNOSIS — R5381 Other malaise: Secondary | ICD-10-CM

## 2011-07-28 LAB — PROTIME-INR: Protime: 21.6 Seconds — ABNORMAL HIGH (ref 10.6–13.4)

## 2011-08-10 ENCOUNTER — Other Ambulatory Visit: Payer: Self-pay | Admitting: Oncology

## 2011-08-10 ENCOUNTER — Encounter (HOSPITAL_BASED_OUTPATIENT_CLINIC_OR_DEPARTMENT_OTHER): Payer: Medicare Other | Admitting: Oncology

## 2011-08-10 DIAGNOSIS — I10 Essential (primary) hypertension: Secondary | ICD-10-CM

## 2011-08-10 DIAGNOSIS — Z7901 Long term (current) use of anticoagulants: Secondary | ICD-10-CM

## 2011-08-10 DIAGNOSIS — R5383 Other fatigue: Secondary | ICD-10-CM

## 2011-08-10 DIAGNOSIS — Z86718 Personal history of other venous thrombosis and embolism: Secondary | ICD-10-CM

## 2011-08-10 LAB — PROTIME-INR: INR: 1.8 — ABNORMAL LOW (ref 2.00–3.50)

## 2011-09-01 ENCOUNTER — Telehealth: Payer: Self-pay | Admitting: *Deleted

## 2011-09-01 ENCOUNTER — Other Ambulatory Visit: Payer: Self-pay | Admitting: Oncology

## 2011-09-01 ENCOUNTER — Other Ambulatory Visit (HOSPITAL_BASED_OUTPATIENT_CLINIC_OR_DEPARTMENT_OTHER): Payer: Medicare Other

## 2011-09-01 DIAGNOSIS — Z7901 Long term (current) use of anticoagulants: Secondary | ICD-10-CM

## 2011-09-01 DIAGNOSIS — R5381 Other malaise: Secondary | ICD-10-CM

## 2011-09-01 DIAGNOSIS — I10 Essential (primary) hypertension: Secondary | ICD-10-CM

## 2011-09-01 DIAGNOSIS — Z86718 Personal history of other venous thrombosis and embolism: Secondary | ICD-10-CM

## 2011-09-01 LAB — PROTIME-INR: INR: 1.9 — ABNORMAL LOW (ref 2.00–3.50)

## 2011-09-01 NOTE — Telephone Encounter (Signed)
Called pt, continue same dose of Coumadin, per Dr. Truett Perna. Will check PT INR in one month. Pt verbalized understanding.

## 2011-09-08 ENCOUNTER — Telehealth: Payer: Self-pay | Admitting: *Deleted

## 2011-09-08 NOTE — Telephone Encounter (Signed)
Open in error

## 2011-09-28 ENCOUNTER — Other Ambulatory Visit: Payer: Self-pay

## 2011-09-28 DIAGNOSIS — I749 Embolism and thrombosis of unspecified artery: Secondary | ICD-10-CM

## 2011-09-28 MED ORDER — IBUPROFEN 800 MG PO TABS: ORAL_TABLET | ORAL | Status: DC

## 2011-09-29 ENCOUNTER — Encounter: Payer: Self-pay | Admitting: *Deleted

## 2011-09-29 ENCOUNTER — Telehealth: Payer: Self-pay | Admitting: *Deleted

## 2011-09-29 ENCOUNTER — Other Ambulatory Visit (HOSPITAL_BASED_OUTPATIENT_CLINIC_OR_DEPARTMENT_OTHER): Payer: Medicare Other | Admitting: Lab

## 2011-09-29 ENCOUNTER — Other Ambulatory Visit: Payer: Self-pay | Admitting: Oncology

## 2011-09-29 DIAGNOSIS — Z7901 Long term (current) use of anticoagulants: Secondary | ICD-10-CM

## 2011-09-29 DIAGNOSIS — R5381 Other malaise: Secondary | ICD-10-CM

## 2011-09-29 DIAGNOSIS — Z86718 Personal history of other venous thrombosis and embolism: Secondary | ICD-10-CM

## 2011-09-29 DIAGNOSIS — I10 Essential (primary) hypertension: Secondary | ICD-10-CM

## 2011-09-29 DIAGNOSIS — R5383 Other fatigue: Secondary | ICD-10-CM

## 2011-09-29 LAB — PROTIME-INR

## 2011-09-29 NOTE — Telephone Encounter (Signed)
Per Dr. Truett Perna: Coumadin 4 mg daily, except 3 mg on Mon &  Fri. Recheck 10/27/11 as scheduled. Patient understands and agrees.

## 2011-10-27 ENCOUNTER — Other Ambulatory Visit: Payer: Self-pay | Admitting: Oncology

## 2011-10-27 ENCOUNTER — Other Ambulatory Visit (HOSPITAL_BASED_OUTPATIENT_CLINIC_OR_DEPARTMENT_OTHER): Payer: Medicare Other | Admitting: Lab

## 2011-10-27 DIAGNOSIS — Z86718 Personal history of other venous thrombosis and embolism: Secondary | ICD-10-CM

## 2011-10-27 DIAGNOSIS — Z7901 Long term (current) use of anticoagulants: Secondary | ICD-10-CM

## 2011-10-27 DIAGNOSIS — I10 Essential (primary) hypertension: Secondary | ICD-10-CM

## 2011-10-27 DIAGNOSIS — R5383 Other fatigue: Secondary | ICD-10-CM

## 2011-10-27 LAB — PROTIME-INR

## 2011-11-04 ENCOUNTER — Telehealth: Payer: Self-pay | Admitting: *Deleted

## 2011-11-04 NOTE — Telephone Encounter (Signed)
Message copied by Wandalee Ferdinand on Thu Nov 04, 2011  7:09 PM ------      Message from: Ladene Artist      Created: Sun Oct 31, 2011  7:31 PM       Please call patient, same coumadin, check PT 1 month

## 2011-11-04 NOTE — Telephone Encounter (Signed)
Continue 4 mg daily, except 3 mg on M & F. Recheck in [place] as scheduled.

## 2011-11-15 ENCOUNTER — Other Ambulatory Visit: Payer: Self-pay | Admitting: *Deleted

## 2011-11-15 DIAGNOSIS — I749 Embolism and thrombosis of unspecified artery: Secondary | ICD-10-CM

## 2011-11-15 MED ORDER — WARFARIN SODIUM 4 MG PO TABS: 4.0000 mg | ORAL_TABLET | Freq: Every day | ORAL | Status: DC

## 2011-11-24 ENCOUNTER — Other Ambulatory Visit: Payer: Self-pay | Admitting: *Deleted

## 2011-11-24 ENCOUNTER — Other Ambulatory Visit (HOSPITAL_BASED_OUTPATIENT_CLINIC_OR_DEPARTMENT_OTHER): Payer: Medicare Other | Admitting: Lab

## 2011-11-24 DIAGNOSIS — I82509 Chronic embolism and thrombosis of unspecified deep veins of unspecified lower extremity: Secondary | ICD-10-CM

## 2011-11-24 DIAGNOSIS — Z7901 Long term (current) use of anticoagulants: Secondary | ICD-10-CM

## 2011-11-24 LAB — PROTIME-INR
INR: 2.1 (ref 2.00–3.50)
Protime: 25.2 Seconds — ABNORMAL HIGH (ref 10.6–13.4)

## 2011-11-26 ENCOUNTER — Telehealth: Payer: Self-pay | Admitting: *Deleted

## 2011-11-26 NOTE — Telephone Encounter (Signed)
Instructions given, dose confirmed. Pt verbalized understanding.

## 2011-11-26 NOTE — Telephone Encounter (Signed)
Message copied by Caleb Popp on Fri Nov 26, 2011  5:54 PM ------      Message from: [name] B      Created: Thu Nov 25, 2011  8:33 PM       Please call patient, same coumadin, check PT in 1 month

## 2011-12-22 ENCOUNTER — Telehealth: Payer: Self-pay | Admitting: *Deleted

## 2011-12-22 ENCOUNTER — Other Ambulatory Visit (HOSPITAL_BASED_OUTPATIENT_CLINIC_OR_DEPARTMENT_OTHER): Payer: Medicare Other | Admitting: Lab

## 2011-12-22 DIAGNOSIS — I82509 Chronic embolism and thrombosis of unspecified deep veins of unspecified lower extremity: Secondary | ICD-10-CM

## 2011-12-22 LAB — PROTIME-INR
INR: 2.8 (ref 2.00–3.50)
Protime: 33.6 Seconds — ABNORMAL HIGH (ref 10.6–13.4)

## 2011-12-22 NOTE — Telephone Encounter (Signed)
Per Lonna Cobb, NP: Same Coumadin and recheck in 1 month.

## 2012-01-12 ENCOUNTER — Other Ambulatory Visit: Payer: Self-pay | Admitting: Oncology

## 2012-01-12 DIAGNOSIS — Z7901 Long term (current) use of anticoagulants: Secondary | ICD-10-CM

## 2012-01-12 DIAGNOSIS — I749 Embolism and thrombosis of unspecified artery: Secondary | ICD-10-CM

## 2012-01-19 ENCOUNTER — Other Ambulatory Visit (HOSPITAL_BASED_OUTPATIENT_CLINIC_OR_DEPARTMENT_OTHER): Payer: Medicare Other | Admitting: Lab

## 2012-01-19 DIAGNOSIS — I82509 Chronic embolism and thrombosis of unspecified deep veins of unspecified lower extremity: Secondary | ICD-10-CM

## 2012-01-20 ENCOUNTER — Telehealth: Payer: Self-pay | Admitting: *Deleted

## 2012-01-20 NOTE — Telephone Encounter (Signed)
Per Dr. Truett Perna: Instructed patient to hold coumadin 4/3 & 4/4. Recheck PT on 4/5. Confirmed he has made no changes in meds or diet.  Was on 4 mg daily, except 3 mg M & F

## 2012-01-21 ENCOUNTER — Telehealth: Payer: Self-pay | Admitting: Oncology

## 2012-01-21 ENCOUNTER — Telehealth: Payer: Self-pay | Admitting: *Deleted

## 2012-01-21 ENCOUNTER — Other Ambulatory Visit (HOSPITAL_BASED_OUTPATIENT_CLINIC_OR_DEPARTMENT_OTHER): Payer: Medicare Other | Admitting: Lab

## 2012-01-21 DIAGNOSIS — I749 Embolism and thrombosis of unspecified artery: Secondary | ICD-10-CM

## 2012-01-21 DIAGNOSIS — I82509 Chronic embolism and thrombosis of unspecified deep veins of unspecified lower extremity: Secondary | ICD-10-CM

## 2012-01-21 LAB — PROTIME-INR

## 2012-01-21 NOTE — Telephone Encounter (Signed)
called pt to scheduled lab on 4-12 and pt wanted a weds appt fot 4-10

## 2012-01-21 NOTE — Telephone Encounter (Signed)
Notified patient that per NP resume previous coumadin and recheck in 1 week. POF to scheduler.

## 2012-01-26 ENCOUNTER — Other Ambulatory Visit (HOSPITAL_BASED_OUTPATIENT_CLINIC_OR_DEPARTMENT_OTHER): Payer: Medicare Other | Admitting: Lab

## 2012-01-26 DIAGNOSIS — I82509 Chronic embolism and thrombosis of unspecified deep veins of unspecified lower extremity: Secondary | ICD-10-CM

## 2012-01-26 LAB — PROTIME-INR
INR: 2.9 (ref 2.00–3.50)
Protime: 34.8 Seconds — ABNORMAL HIGH (ref 10.6–13.4)

## 2012-01-28 ENCOUNTER — Telehealth: Payer: Self-pay | Admitting: Oncology

## 2012-01-28 ENCOUNTER — Telehealth: Payer: Self-pay | Admitting: *Deleted

## 2012-01-28 NOTE — Telephone Encounter (Signed)
Message copied by Caleb Popp on Fri Jan 28, 2012  4:09 PM ------      Message from: [name] B      Created: Thu Jan 27, 2012 10:29 PM       Please call patient, same coumadin, check PT in 2 weeks

## 2012-01-28 NOTE — Telephone Encounter (Signed)
Continue Coumadin 4 mg daily except 3 mg every Mon+Fri. Recheck in 2 weeks, pt verbalized understanding. Schedulers will call with lab appt.

## 2012-01-28 NOTE — Telephone Encounter (Signed)
called pt scheduled appt for labs on 04/24

## 2012-02-09 ENCOUNTER — Other Ambulatory Visit (HOSPITAL_BASED_OUTPATIENT_CLINIC_OR_DEPARTMENT_OTHER): Payer: Medicare Other | Admitting: Lab

## 2012-02-09 DIAGNOSIS — I82509 Chronic embolism and thrombosis of unspecified deep veins of unspecified lower extremity: Secondary | ICD-10-CM

## 2012-02-09 LAB — PROTIME-INR: Protime: 43.2 Seconds — ABNORMAL HIGH (ref 10.6–13.4)

## 2012-02-16 ENCOUNTER — Other Ambulatory Visit (HOSPITAL_BASED_OUTPATIENT_CLINIC_OR_DEPARTMENT_OTHER): Payer: Medicare Other | Admitting: Lab

## 2012-02-16 ENCOUNTER — Telehealth: Payer: Self-pay | Admitting: *Deleted

## 2012-02-16 DIAGNOSIS — I749 Embolism and thrombosis of unspecified artery: Secondary | ICD-10-CM

## 2012-02-16 DIAGNOSIS — Z7901 Long term (current) use of anticoagulants: Secondary | ICD-10-CM

## 2012-02-16 DIAGNOSIS — I82509 Chronic embolism and thrombosis of unspecified deep veins of unspecified lower extremity: Secondary | ICD-10-CM

## 2012-02-16 DIAGNOSIS — Z5181 Encounter for therapeutic drug level monitoring: Secondary | ICD-10-CM

## 2012-02-16 LAB — PROTIME-INR
INR: 4.2 — ABNORMAL HIGH (ref 2.00–3.50)
Protime: 50.4 Seconds — ABNORMAL HIGH (ref 10.6–13.4)

## 2012-02-16 NOTE — Telephone Encounter (Signed)
INR reviewed by Misty Stanley, NP. HOLD Coumadin tonight. Recheck 02/17/12. Pt verbalized understanding.

## 2012-02-17 ENCOUNTER — Other Ambulatory Visit: Payer: Self-pay | Admitting: *Deleted

## 2012-02-17 ENCOUNTER — Telehealth: Payer: Self-pay | Admitting: *Deleted

## 2012-02-17 ENCOUNTER — Ambulatory Visit (HOSPITAL_BASED_OUTPATIENT_CLINIC_OR_DEPARTMENT_OTHER): Payer: Medicare Other | Admitting: Lab

## 2012-02-17 DIAGNOSIS — I82509 Chronic embolism and thrombosis of unspecified deep veins of unspecified lower extremity: Secondary | ICD-10-CM

## 2012-02-17 DIAGNOSIS — I749 Embolism and thrombosis of unspecified artery: Secondary | ICD-10-CM

## 2012-02-17 DIAGNOSIS — Z7901 Long term (current) use of anticoagulants: Secondary | ICD-10-CM

## 2012-02-17 DIAGNOSIS — Z5181 Encounter for therapeutic drug level monitoring: Secondary | ICD-10-CM

## 2012-02-17 LAB — PROTIME-INR
INR: 3.1 (ref 2.00–3.50)
Protime: 37.2 Seconds — ABNORMAL HIGH (ref 10.6–13.4)

## 2012-02-17 NOTE — Telephone Encounter (Signed)
Pt notified to change dose of coumadin to 3 mg daily & to recheck PT/INR in 1 wk.  He expressed understanding.  Order to schedulers.

## 2012-02-18 ENCOUNTER — Telehealth: Payer: Self-pay | Admitting: Oncology

## 2012-02-18 NOTE — Telephone Encounter (Signed)
Talked to pt, he wants to come on Wednesday 02/23/12, appt now changed to 5/8 and pt aware of appt

## 2012-02-23 ENCOUNTER — Other Ambulatory Visit (HOSPITAL_BASED_OUTPATIENT_CLINIC_OR_DEPARTMENT_OTHER): Payer: Medicare Other | Admitting: Lab

## 2012-02-23 ENCOUNTER — Telehealth: Payer: Self-pay | Admitting: *Deleted

## 2012-02-23 ENCOUNTER — Other Ambulatory Visit: Payer: Medicare Other | Admitting: Lab

## 2012-02-23 DIAGNOSIS — I749 Embolism and thrombosis of unspecified artery: Secondary | ICD-10-CM

## 2012-02-23 DIAGNOSIS — Z5181 Encounter for therapeutic drug level monitoring: Secondary | ICD-10-CM

## 2012-02-23 DIAGNOSIS — Z7901 Long term (current) use of anticoagulants: Secondary | ICD-10-CM

## 2012-02-23 LAB — PROTIME-INR

## 2012-02-23 NOTE — Telephone Encounter (Signed)
Called pt with instructions to continue Coumadin 3 mg daily, per Dr. Truett Perna. He verbalized understanding.

## 2012-02-24 ENCOUNTER — Other Ambulatory Visit: Payer: Self-pay | Admitting: *Deleted

## 2012-02-24 ENCOUNTER — Other Ambulatory Visit: Payer: Medicare Other | Admitting: Lab

## 2012-03-15 ENCOUNTER — Other Ambulatory Visit (HOSPITAL_BASED_OUTPATIENT_CLINIC_OR_DEPARTMENT_OTHER): Payer: Medicare Other | Admitting: Lab

## 2012-03-15 DIAGNOSIS — I82509 Chronic embolism and thrombosis of unspecified deep veins of unspecified lower extremity: Secondary | ICD-10-CM

## 2012-03-15 LAB — PROTIME-INR: INR: 2.3 (ref 2.00–3.50)

## 2012-03-16 ENCOUNTER — Encounter: Payer: Self-pay | Admitting: *Deleted

## 2012-03-20 ENCOUNTER — Telehealth: Payer: Self-pay | Admitting: *Deleted

## 2012-03-20 NOTE — Telephone Encounter (Signed)
Message copied by Caleb Popp on Mon Mar 20, 2012 10:20 AM ------      Message from: Ladene Artist      Created: Sat Mar 18, 2012  9:33 AM       Please call patient, same coumadin, check PT 1 month

## 2012-04-12 ENCOUNTER — Other Ambulatory Visit (HOSPITAL_BASED_OUTPATIENT_CLINIC_OR_DEPARTMENT_OTHER): Payer: Medicare Other | Admitting: Lab

## 2012-04-12 DIAGNOSIS — I82509 Chronic embolism and thrombosis of unspecified deep veins of unspecified lower extremity: Secondary | ICD-10-CM

## 2012-04-12 LAB — PROTIME-INR: Protime: 26.4 Seconds — ABNORMAL HIGH (ref 10.6–13.4)

## 2012-04-19 ENCOUNTER — Telehealth: Payer: Self-pay | Admitting: *Deleted

## 2012-04-19 NOTE — Telephone Encounter (Signed)
Message copied by Caleb Popp on Wed Apr 19, 2012  4:37 PM ------      Message from: Ladene Artist      Created: Fri Apr 14, 2012  7:10 AM       Please call patient, same coumadin, check PT 1 month

## 2012-04-19 NOTE — Telephone Encounter (Signed)
Called pt, he understands to continue same dose of Coumadin. Confirms he is taking 3 mg daily. Next appt confirmed.

## 2012-05-10 ENCOUNTER — Other Ambulatory Visit (HOSPITAL_BASED_OUTPATIENT_CLINIC_OR_DEPARTMENT_OTHER): Payer: Medicare Other | Admitting: Lab

## 2012-05-10 ENCOUNTER — Other Ambulatory Visit: Payer: Self-pay | Admitting: *Deleted

## 2012-05-10 ENCOUNTER — Telehealth: Payer: Self-pay | Admitting: *Deleted

## 2012-05-10 DIAGNOSIS — I82509 Chronic embolism and thrombosis of unspecified deep veins of unspecified lower extremity: Secondary | ICD-10-CM

## 2012-05-10 DIAGNOSIS — I82409 Acute embolism and thrombosis of unspecified deep veins of unspecified lower extremity: Secondary | ICD-10-CM

## 2012-05-10 LAB — PROTIME-INR: Protime: 14.4 Seconds — ABNORMAL HIGH (ref 10.6–13.4)

## 2012-05-10 NOTE — Telephone Encounter (Signed)
Called pt: He denies any missed Coumadin doses. Reports he had changed his diet to lose weight. Continues Coumadin 3 mg daily. Pt also reports he is seeing Dr. Clarene Duke for neck pain.

## 2012-05-12 ENCOUNTER — Telehealth: Payer: Self-pay | Admitting: Oncology

## 2012-05-12 ENCOUNTER — Other Ambulatory Visit: Payer: Self-pay | Admitting: *Deleted

## 2012-05-12 DIAGNOSIS — I8290 Acute embolism and thrombosis of unspecified vein: Secondary | ICD-10-CM

## 2012-05-12 NOTE — Telephone Encounter (Signed)
S/w pt re appts for 8/7 and 8/15. appts scheduled per 7/24 and 7/26.

## 2012-05-17 ENCOUNTER — Telehealth: Payer: Self-pay | Admitting: Oncology

## 2012-05-17 NOTE — Telephone Encounter (Signed)
Talked to pt, he is aware of all August 2013 lab and MD

## 2012-05-22 ENCOUNTER — Other Ambulatory Visit: Payer: Self-pay | Admitting: *Deleted

## 2012-05-24 ENCOUNTER — Other Ambulatory Visit (HOSPITAL_BASED_OUTPATIENT_CLINIC_OR_DEPARTMENT_OTHER): Payer: Medicare Other | Admitting: Lab

## 2012-05-24 ENCOUNTER — Other Ambulatory Visit: Payer: Self-pay | Admitting: Nurse Practitioner

## 2012-05-24 ENCOUNTER — Other Ambulatory Visit: Payer: Self-pay | Admitting: *Deleted

## 2012-05-24 ENCOUNTER — Telehealth: Payer: Self-pay | Admitting: *Deleted

## 2012-05-24 DIAGNOSIS — I8290 Acute embolism and thrombosis of unspecified vein: Secondary | ICD-10-CM

## 2012-05-24 DIAGNOSIS — I749 Embolism and thrombosis of unspecified artery: Secondary | ICD-10-CM

## 2012-05-24 LAB — PROTIME-INR: INR: 1.7 — ABNORMAL LOW (ref 2.00–3.50)

## 2012-05-24 MED ORDER — WARFARIN SODIUM 4 MG PO TABS: ORAL_TABLET | ORAL | Status: DC

## 2012-05-24 NOTE — Telephone Encounter (Signed)
Called pt, he denies any missed Coumadin doses. Reports he just finished up an antibiotic. PT INR reviewed by Misty Stanley, NP. Order received to change dose to 4 mg every MWF. 3 mg all other days. Pt voiced understanding. Will call in 4 mg tablet to his pharmacy.

## 2012-05-25 ENCOUNTER — Telehealth: Payer: Self-pay | Admitting: Oncology

## 2012-05-25 NOTE — Telephone Encounter (Signed)
Per note from nurse. Previous pof was disregarded and pt was scheduled for lb only 8/22 and lb/fu in one month. appts scheduled accordingly and pt contacted by [place].

## 2012-05-25 NOTE — Telephone Encounter (Signed)
S/w the pt and he is aware of the lab appt in aug and the r/s appts from aug to sept. Pt requested [place] to mail him the new schedule

## 2012-06-01 ENCOUNTER — Other Ambulatory Visit: Payer: Medicare Other | Admitting: Lab

## 2012-06-01 ENCOUNTER — Ambulatory Visit: Payer: Medicare Other | Admitting: Oncology

## 2012-06-07 ENCOUNTER — Other Ambulatory Visit: Payer: Medicare Other | Admitting: Lab

## 2012-06-08 ENCOUNTER — Telehealth: Payer: Self-pay | Admitting: *Deleted

## 2012-06-08 ENCOUNTER — Other Ambulatory Visit (HOSPITAL_BASED_OUTPATIENT_CLINIC_OR_DEPARTMENT_OTHER): Payer: Medicare Other | Admitting: Lab

## 2012-06-08 DIAGNOSIS — I749 Embolism and thrombosis of unspecified artery: Secondary | ICD-10-CM

## 2012-06-08 DIAGNOSIS — I8290 Acute embolism and thrombosis of unspecified vein: Secondary | ICD-10-CM

## 2012-06-08 LAB — PROTIME-INR: INR: 3.1 (ref 2.00–3.50)

## 2012-06-08 NOTE — Telephone Encounter (Signed)
Called patient and verified dose of coumadin to be 4mg  M-W-F and then 3mg  other days.  Per Dr. Truett Perna:  Stay on same dose and recheck in 2 weeks.  Pt. Prefers to come on 9/4 at 12:15pm

## 2012-06-13 ENCOUNTER — Telehealth: Payer: Self-pay | Admitting: Oncology

## 2012-06-13 NOTE — Telephone Encounter (Signed)
S/w pt re appt for 9/4 @ 12 pm.

## 2012-06-21 ENCOUNTER — Other Ambulatory Visit (HOSPITAL_BASED_OUTPATIENT_CLINIC_OR_DEPARTMENT_OTHER): Payer: Medicare Other | Admitting: Lab

## 2012-06-21 DIAGNOSIS — I82509 Chronic embolism and thrombosis of unspecified deep veins of unspecified lower extremity: Secondary | ICD-10-CM

## 2012-06-21 LAB — PROTIME-INR
INR: 3.9 — ABNORMAL HIGH (ref 2.00–3.50)
Protime: 46.8 Seconds — ABNORMAL HIGH (ref 10.6–13.4)

## 2012-06-22 ENCOUNTER — Telehealth: Payer: Self-pay | Admitting: *Deleted

## 2012-06-22 DIAGNOSIS — Z86718 Personal history of other venous thrombosis and embolism: Secondary | ICD-10-CM

## 2012-06-22 NOTE — Telephone Encounter (Signed)
LAB ONLY 06/23/12 at [facility] pt aware of time

## 2012-06-22 NOTE — Telephone Encounter (Signed)
Called pt, he reports he did not take Coumadin on 06/21/12. Instructed pt to HOLD Coumadin on 06/22/12 as well. Will need to recheck INR on 06/23/12. He voiced understanding. Pt requests 2PM lab appt.

## 2012-06-23 ENCOUNTER — Ambulatory Visit (HOSPITAL_BASED_OUTPATIENT_CLINIC_OR_DEPARTMENT_OTHER): Payer: Medicare Other | Admitting: Lab

## 2012-06-23 ENCOUNTER — Telehealth: Payer: Self-pay | Admitting: Oncology

## 2012-06-23 ENCOUNTER — Ambulatory Visit: Payer: Self-pay | Admitting: Certified Registered Nurse Anesthetist

## 2012-06-23 ENCOUNTER — Encounter: Payer: Self-pay | Admitting: Certified Registered Nurse Anesthetist

## 2012-06-23 ENCOUNTER — Telehealth: Payer: Self-pay | Admitting: *Deleted

## 2012-06-23 DIAGNOSIS — Z86718 Personal history of other venous thrombosis and embolism: Secondary | ICD-10-CM

## 2012-06-23 LAB — PROTIME-INR: Protime: 27.6 Seconds — ABNORMAL HIGH (ref 10.6–13.4)

## 2012-06-23 NOTE — Telephone Encounter (Signed)
Gave pt appt for lab and MD September 2013 , pt sent to lab today

## 2012-06-23 NOTE — Telephone Encounter (Signed)
Called and spoke with pt regarding PT/INR results.  Per Dr. Truett Perna, take Coumadin 3mg  daily and we will re-check labs in 2 weeks.  Pt verbalized understanding of instructions.

## 2012-06-28 ENCOUNTER — Other Ambulatory Visit: Payer: Self-pay | Admitting: Pharmacist

## 2012-06-28 DIAGNOSIS — I82409 Acute embolism and thrombosis of unspecified deep veins of unspecified lower extremity: Secondary | ICD-10-CM | POA: Insufficient documentation

## 2012-07-04 ENCOUNTER — Ambulatory Visit: Payer: Medicare Other | Admitting: Pharmacist

## 2012-07-04 ENCOUNTER — Other Ambulatory Visit (HOSPITAL_BASED_OUTPATIENT_CLINIC_OR_DEPARTMENT_OTHER): Payer: Medicare Other | Admitting: Lab

## 2012-07-04 ENCOUNTER — Ambulatory Visit (HOSPITAL_BASED_OUTPATIENT_CLINIC_OR_DEPARTMENT_OTHER): Payer: Medicare Other | Admitting: Oncology

## 2012-07-04 VITALS — BP 151/80 | HR 66 | Temp 97.1°F | Resp 20 | Ht 71.0 in | Wt 219.0 lb

## 2012-07-04 DIAGNOSIS — I82409 Acute embolism and thrombosis of unspecified deep veins of unspecified lower extremity: Secondary | ICD-10-CM

## 2012-07-04 DIAGNOSIS — I82509 Chronic embolism and thrombosis of unspecified deep veins of unspecified lower extremity: Secondary | ICD-10-CM

## 2012-07-04 LAB — PROTIME-INR
INR: 3.8 — ABNORMAL HIGH (ref 2.00–3.50)
Protime: 45.6 Seconds — ABNORMAL HIGH (ref 10.6–13.4)

## 2012-07-04 NOTE — Progress Notes (Signed)
INR supratherapeutic today (3.8) Pt is a long-term anticoagulation patient of Dr. Kalman Drape that has been referred to the [facility] since he has been having trouble maintaining stable therapeutic INRs in this patient.   He has been on Coumadin for several years.  His most recent dose (over the last 7+ days) has been 3 mg daily. No problems with bleeding or bruising recently.  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 No recent changes in medications or diet.   Will have patient hold dose x1 day.  Then decrease dose to 2.5mg  daily.  Pt prefers to only use whole tablets since he struggles to cut them in half.  Will call in a new Rx for 2.5mg  tablets to his [facility]. Recheck INR in 1 week.

## 2012-07-05 NOTE — Progress Notes (Signed)
   [facility]    OFFICE PROGRESS NOTE   INTERVAL HISTORY:   He returns as scheduled to he continues Coumadin anticoagulation. No symptom of recurrent venous thrombosis. He recently had a urinary tract infection that was treated with "antibiotics ".  The the Coumadin dosing is now managed by the [facility] Coumadin clinic.  Objective:  Vital signs in last 24 hours:  Blood pressure 151/80, pulse 66, temperature 97.1 F (36.2 C), temperature source Oral, resp. rate 20, height 5\' 11"  (1.803 m), weight 219 lb (99.338 kg).    Resp: Slight decrease in breath sounds in the right compared to the left chest. No respiratory distress Cardio: Regular rate and rhythm GI: No hepatosplenomegaly, mild tenderness in the left abdomen (chronic per patient), no mass Vascular: No leg edema   Lab Results:  PT-INR 3.8    Medications: I have reviewed the patient's current medications.  Assessment/Plan: 1. History of recurrent venous thrombosis.  He is maintained on indefinite Coumadin anticoagulation. 2. Hypertension.  Followed at [facility]. 3. Peripheral vascular disease.  Followed at [facility].    Disposition:  He will continue Coumadin anticoagulation. The Coumadin dosing is now being managed by the [facility] anticoagulation [facility]. Tony Zhang will be scheduled for a one-year office visit.   Tony Papas, MD  07/05/2012  8:16 PM

## 2012-07-07 ENCOUNTER — Telehealth: Payer: Self-pay | Admitting: Oncology

## 2012-07-07 NOTE — Telephone Encounter (Signed)
Phone # stated that services was no longer in service....mailed sept 2014 schedule to pt....sed

## 2012-07-12 ENCOUNTER — Ambulatory Visit (HOSPITAL_BASED_OUTPATIENT_CLINIC_OR_DEPARTMENT_OTHER): Payer: Medicare Other | Admitting: Pharmacist

## 2012-07-12 ENCOUNTER — Other Ambulatory Visit (HOSPITAL_BASED_OUTPATIENT_CLINIC_OR_DEPARTMENT_OTHER): Payer: Medicare Other | Admitting: Lab

## 2012-07-12 DIAGNOSIS — I82409 Acute embolism and thrombosis of unspecified deep veins of unspecified lower extremity: Secondary | ICD-10-CM

## 2012-07-12 DIAGNOSIS — I82509 Chronic embolism and thrombosis of unspecified deep veins of unspecified lower extremity: Secondary | ICD-10-CM

## 2012-07-12 NOTE — Patient Instructions (Addendum)
Continue 2.5mg  daily.   Recheck INR in 2 weeks on 07/26/12 at 11:00am for lab and 11:15am for coumadin clinic.

## 2012-07-12 NOTE — Progress Notes (Signed)
Recent INR (on 07/05/12) possibly elevated secondary to drug interaction between DHEA and Coumadin. Pt takes DHEA 25mg  daily as prescribed by his physician (Dr. Clarene Duke) to help with his energy levels. INR within goal today. Will continue 2.5mg  daily.   Recheck INR in 2 weeks on 07/26/12 at 11:00am for lab and 11:15am for coumadin clinic. Pt is eager to return to monthly INR checks.

## 2012-07-26 ENCOUNTER — Other Ambulatory Visit (HOSPITAL_BASED_OUTPATIENT_CLINIC_OR_DEPARTMENT_OTHER): Payer: Medicare Other | Admitting: Lab

## 2012-07-26 ENCOUNTER — Ambulatory Visit (HOSPITAL_BASED_OUTPATIENT_CLINIC_OR_DEPARTMENT_OTHER): Payer: Medicare Other | Admitting: Pharmacist

## 2012-07-26 DIAGNOSIS — I82509 Chronic embolism and thrombosis of unspecified deep veins of unspecified lower extremity: Secondary | ICD-10-CM

## 2012-07-26 DIAGNOSIS — Z7901 Long term (current) use of anticoagulants: Secondary | ICD-10-CM

## 2012-07-26 DIAGNOSIS — I82409 Acute embolism and thrombosis of unspecified deep veins of unspecified lower extremity: Secondary | ICD-10-CM

## 2012-07-26 LAB — PROTIME-INR

## 2012-07-26 MED ORDER — WARFARIN SODIUM 2.5 MG PO TABS: 2.5000 mg | ORAL_TABLET | Freq: Every day | ORAL | Status: DC

## 2012-07-26 NOTE — Progress Notes (Signed)
INR therapeutic today (2.6) on 2.5mg  daily No changes in meds or diet.  No problems with bleeding or bruising. Will continue current dose and recheck INR in 3-4 weeks.  Refills on Coumadin 2.5mg  sent to his [facility].

## 2012-08-23 ENCOUNTER — Ambulatory Visit (HOSPITAL_BASED_OUTPATIENT_CLINIC_OR_DEPARTMENT_OTHER): Payer: Medicare Other | Admitting: Lab

## 2012-08-23 ENCOUNTER — Ambulatory Visit (HOSPITAL_BASED_OUTPATIENT_CLINIC_OR_DEPARTMENT_OTHER): Payer: Medicare Other | Admitting: Pharmacist

## 2012-08-23 DIAGNOSIS — I82409 Acute embolism and thrombosis of unspecified deep veins of unspecified lower extremity: Secondary | ICD-10-CM

## 2012-08-23 LAB — PROTIME-INR: INR: 2 (ref 2.00–3.50)

## 2012-08-23 MED ORDER — WARFARIN SODIUM 2.5 MG PO TABS: 2.5000 mg | ORAL_TABLET | Freq: Every day | ORAL | Status: DC

## 2012-08-23 NOTE — Patient Instructions (Addendum)
Continue 2.5mg  daily.   Recheck INR on 09/27/12 at 11:45am for lab and 1200pm for coumadin clinic.

## 2012-08-23 NOTE — Progress Notes (Signed)
Continue 2.5mg daily.   Recheck INR on 09/27/12 at 11:45am for lab and 1200pm for coumadin clinic. 

## 2012-09-27 ENCOUNTER — Other Ambulatory Visit (HOSPITAL_BASED_OUTPATIENT_CLINIC_OR_DEPARTMENT_OTHER): Payer: Medicare Other | Admitting: Lab

## 2012-09-27 ENCOUNTER — Ambulatory Visit (HOSPITAL_BASED_OUTPATIENT_CLINIC_OR_DEPARTMENT_OTHER): Payer: Medicare Other | Admitting: Pharmacist

## 2012-09-27 DIAGNOSIS — I82409 Acute embolism and thrombosis of unspecified deep veins of unspecified lower extremity: Secondary | ICD-10-CM

## 2012-09-27 DIAGNOSIS — I82509 Chronic embolism and thrombosis of unspecified deep veins of unspecified lower extremity: Secondary | ICD-10-CM

## 2012-09-27 LAB — PROTIME-INR
INR: 1.9 — ABNORMAL LOW (ref 2.00–3.50)
Protime: 22.8 Seconds — ABNORMAL HIGH (ref 10.6–13.4)

## 2012-09-27 NOTE — Progress Notes (Signed)
INR slightly subtherapeutic today (1.9) on 2.5mg  daily. No changes.  No missed doses.  No complaints. Will have pt take 5mg  x 1 today, then resume 2.5mg  daily since pt has been stable on this dose for quite some time.  Recheck INR in 6 weeks.

## 2012-10-25 ENCOUNTER — Other Ambulatory Visit (HOSPITAL_BASED_OUTPATIENT_CLINIC_OR_DEPARTMENT_OTHER): Payer: Medicare Other | Admitting: Lab

## 2012-10-25 ENCOUNTER — Ambulatory Visit (HOSPITAL_BASED_OUTPATIENT_CLINIC_OR_DEPARTMENT_OTHER): Payer: Medicare Other | Admitting: Pharmacist

## 2012-10-25 DIAGNOSIS — Z5181 Encounter for therapeutic drug level monitoring: Secondary | ICD-10-CM

## 2012-10-25 DIAGNOSIS — I82409 Acute embolism and thrombosis of unspecified deep veins of unspecified lower extremity: Secondary | ICD-10-CM

## 2012-10-25 DIAGNOSIS — Z7901 Long term (current) use of anticoagulants: Secondary | ICD-10-CM

## 2012-10-25 NOTE — Patient Instructions (Signed)
Continue 2.5mg  daily.  Recheck INR in 4 weeks on 11/22/12 at 1115 for lab, 1130 for Coumadin clinic

## 2012-10-25 NOTE — Progress Notes (Signed)
No complaints or changes since last visit.  Will continue 2.5mg  daily.  Recheck INR in 4 weeks on 11/22/12 at 1115 for lab, 1130 for Coumadin clinic

## 2012-11-03 ENCOUNTER — Other Ambulatory Visit: Payer: Self-pay | Admitting: *Deleted

## 2012-11-03 DIAGNOSIS — I82409 Acute embolism and thrombosis of unspecified deep veins of unspecified lower extremity: Secondary | ICD-10-CM

## 2012-11-03 MED ORDER — WARFARIN SODIUM 2.5 MG PO TABS: 2.5000 mg | ORAL_TABLET | Freq: Every day | ORAL | Status: DC

## 2012-11-22 ENCOUNTER — Other Ambulatory Visit (HOSPITAL_BASED_OUTPATIENT_CLINIC_OR_DEPARTMENT_OTHER): Payer: Medicare Other | Admitting: Lab

## 2012-11-22 ENCOUNTER — Ambulatory Visit (HOSPITAL_BASED_OUTPATIENT_CLINIC_OR_DEPARTMENT_OTHER): Payer: Medicare Other | Admitting: Pharmacist

## 2012-11-22 DIAGNOSIS — I82409 Acute embolism and thrombosis of unspecified deep veins of unspecified lower extremity: Secondary | ICD-10-CM

## 2012-11-22 LAB — PROTIME-INR: INR: 2.4 (ref 2.00–3.50)

## 2012-11-22 NOTE — Progress Notes (Signed)
INR therapeutic today (2.4) on 2.5mg  daily. No changes.  No complaints.  Continue current dose.  Recheck INR in 6 weeks.

## 2013-01-03 ENCOUNTER — Other Ambulatory Visit (HOSPITAL_BASED_OUTPATIENT_CLINIC_OR_DEPARTMENT_OTHER): Payer: Medicare Other | Admitting: Lab

## 2013-01-03 ENCOUNTER — Ambulatory Visit (HOSPITAL_BASED_OUTPATIENT_CLINIC_OR_DEPARTMENT_OTHER): Payer: Medicare Other | Admitting: Pharmacist

## 2013-01-03 DIAGNOSIS — I82409 Acute embolism and thrombosis of unspecified deep veins of unspecified lower extremity: Secondary | ICD-10-CM

## 2013-01-03 LAB — PROTIME-INR: INR: 2.3 (ref 2.00–3.50)

## 2013-01-03 NOTE — Progress Notes (Signed)
INR = 2.3 on Coumadin 2.5 mg daily No new issues or concerns re: anticoag. INR therapeutic.  No change to Coumadin dose necessary. Return in 6 weeks. Ebony Hail, Pharm.D., CPP 01/03/2013@11 :55 AM

## 2013-02-14 ENCOUNTER — Ambulatory Visit (HOSPITAL_BASED_OUTPATIENT_CLINIC_OR_DEPARTMENT_OTHER): Payer: Medicare Other | Admitting: Pharmacist

## 2013-02-14 ENCOUNTER — Other Ambulatory Visit: Payer: Medicare Other

## 2013-02-14 DIAGNOSIS — I82402 Acute embolism and thrombosis of unspecified deep veins of left lower extremity: Secondary | ICD-10-CM

## 2013-02-14 DIAGNOSIS — I82409 Acute embolism and thrombosis of unspecified deep veins of unspecified lower extremity: Secondary | ICD-10-CM

## 2013-02-14 NOTE — Patient Instructions (Signed)
Continue 2.5mg  daily.   Recheck INR in 5 weeks on 03/21/13 at 11:30am for lab and 11:45am for Coumadin Clinic.

## 2013-02-14 NOTE — Progress Notes (Signed)
INR within goal today. No problems to report regarding anticoagulation. Only one slight medication change: his Ibuprofen was decreased from 800mg  BID to 800mg  QD about 1 month ago by Dr. Salena Saner at [facility]. Will continue 2.5mg  daily.   Recheck INR in 5 weeks on 03/21/13 at 11:30am for lab and 11:45am for Coumadin Clinic.

## 2013-03-19 ENCOUNTER — Other Ambulatory Visit: Payer: Self-pay | Admitting: Cardiovascular Disease

## 2013-03-21 ENCOUNTER — Other Ambulatory Visit (HOSPITAL_BASED_OUTPATIENT_CLINIC_OR_DEPARTMENT_OTHER): Payer: Medicare Other | Admitting: Lab

## 2013-03-21 ENCOUNTER — Ambulatory Visit (HOSPITAL_BASED_OUTPATIENT_CLINIC_OR_DEPARTMENT_OTHER): Payer: Medicare Other | Admitting: Pharmacist

## 2013-03-21 ENCOUNTER — Other Ambulatory Visit: Payer: Self-pay | Admitting: Oncology

## 2013-03-21 DIAGNOSIS — I82402 Acute embolism and thrombosis of unspecified deep veins of left lower extremity: Secondary | ICD-10-CM

## 2013-03-21 DIAGNOSIS — I82409 Acute embolism and thrombosis of unspecified deep veins of unspecified lower extremity: Secondary | ICD-10-CM

## 2013-03-21 LAB — PROTIME-INR: Protime: 25.2 Seconds — ABNORMAL HIGH (ref 10.6–13.4)

## 2013-03-21 LAB — POCT INR: INR: 2.1

## 2013-03-21 NOTE — Patient Instructions (Addendum)
Continue 2.5mg  daily.   Recheck INR in 6 weeks on 05/02/13 at 11:30am for lab and 11:45am for Coumadin Clinic. Monitor for extra bruising or bleeding if you begin the Salmon omega - 3 fatty acid supplement.

## 2013-03-21 NOTE — Progress Notes (Signed)
INR within goal today. No problems to report regarding anticoagulation. Ibuprofen increased to BID - TID PRN. Pt also had cortisone injections (2 injections) in his right hip on 03/20/13. Pt may start Salmon Omega 3 fatty acids. He is aware to monitor for increased bruising/bleeding. He will let us know when he starts this supplement. He will take one capsule daily. Will continue 2.5mg  daily.   Recheck INR in 6 weeks on 05/02/13 at 11:30am for lab and 11:45am for Coumadin Clinic.

## 2013-04-02 ENCOUNTER — Other Ambulatory Visit: Payer: Medicare Other | Admitting: Lab

## 2013-05-02 ENCOUNTER — Ambulatory Visit (HOSPITAL_BASED_OUTPATIENT_CLINIC_OR_DEPARTMENT_OTHER): Payer: Medicare Other | Admitting: Pharmacist

## 2013-05-02 ENCOUNTER — Other Ambulatory Visit (HOSPITAL_BASED_OUTPATIENT_CLINIC_OR_DEPARTMENT_OTHER): Payer: Medicare Other | Admitting: Lab

## 2013-05-02 DIAGNOSIS — I82409 Acute embolism and thrombosis of unspecified deep veins of unspecified lower extremity: Secondary | ICD-10-CM

## 2013-05-02 DIAGNOSIS — I82402 Acute embolism and thrombosis of unspecified deep veins of left lower extremity: Secondary | ICD-10-CM

## 2013-05-02 LAB — PROTIME-INR: Protime: 27.6 Seconds — ABNORMAL HIGH (ref 10.6–13.4)

## 2013-05-02 LAB — POCT INR: INR: 2.3

## 2013-05-02 NOTE — Progress Notes (Signed)
INR continues to be at goal on coumadin 2.5mg  daily.  Tony Zhang states only medication change is increasing ibuprofen from bid to tid per PCP.  He has been taking ibuprofen daily for about 15 yrs.  We discussed increased risk of bleeding on NSAIDS and coumadin.  Tony Zhang has been on current coumadin dose since 06/2012.  Will continue 2.5mg  daily and check PT/INR in 2 months with next MD appt.

## 2013-05-24 ENCOUNTER — Other Ambulatory Visit: Payer: Self-pay | Admitting: Oncology

## 2013-05-24 DIAGNOSIS — I82409 Acute embolism and thrombosis of unspecified deep veins of unspecified lower extremity: Secondary | ICD-10-CM

## 2013-06-08 ENCOUNTER — Other Ambulatory Visit: Payer: Self-pay | Admitting: *Deleted

## 2013-06-08 DIAGNOSIS — I10 Essential (primary) hypertension: Secondary | ICD-10-CM

## 2013-06-12 ENCOUNTER — Telehealth (HOSPITAL_COMMUNITY): Payer: Self-pay | Admitting: Cardiovascular Disease

## 2013-06-21 ENCOUNTER — Telehealth (HOSPITAL_COMMUNITY): Payer: Self-pay | Admitting: Cardiovascular Disease

## 2013-06-22 ENCOUNTER — Other Ambulatory Visit: Payer: Self-pay | Admitting: *Deleted

## 2013-06-22 MED ORDER — FENOFIBRATE 160 MG PO TABS: 160.0000 mg | ORAL_TABLET | Freq: Every day | ORAL | Status: DC

## 2013-06-22 NOTE — Telephone Encounter (Signed)
Rx was sent to pharmacy electronically. 

## 2013-06-26 ENCOUNTER — Telehealth (HOSPITAL_COMMUNITY): Payer: Self-pay | Admitting: Cardiovascular Disease

## 2013-07-06 ENCOUNTER — Other Ambulatory Visit: Payer: Self-pay | Admitting: *Deleted

## 2013-07-09 ENCOUNTER — Ambulatory Visit: Payer: Medicare Other | Admitting: Pharmacist

## 2013-07-09 ENCOUNTER — Ambulatory Visit (HOSPITAL_BASED_OUTPATIENT_CLINIC_OR_DEPARTMENT_OTHER): Payer: Medicare Other | Admitting: Oncology

## 2013-07-09 ENCOUNTER — Ambulatory Visit (HOSPITAL_BASED_OUTPATIENT_CLINIC_OR_DEPARTMENT_OTHER): Payer: Medicare Other | Admitting: Lab

## 2013-07-09 VITALS — BP 182/85 | HR 74 | Temp 97.3°F | Resp 20 | Ht 73.0 in | Wt 244.2 lb

## 2013-07-09 DIAGNOSIS — I82409 Acute embolism and thrombosis of unspecified deep veins of unspecified lower extremity: Secondary | ICD-10-CM

## 2013-07-09 DIAGNOSIS — I82402 Acute embolism and thrombosis of unspecified deep veins of left lower extremity: Secondary | ICD-10-CM

## 2013-07-09 LAB — PROTIME-INR: INR: 1.9 — ABNORMAL LOW (ref 2.00–3.50)

## 2013-07-09 NOTE — Patient Instructions (Addendum)
INR right at goal today at 1.9 No changes Continue 2.5mg  daily.   Recheck INR in 2 months on 09/05/13 at 11:30am for lab and 11:45am for Coumadin Clinic

## 2013-07-09 NOTE — Progress Notes (Signed)
[name] is doing well. He has no complaints. His INR is right at goal today at 1.9. He reports no changes in his diet or any missed or extra doses. He reports no bleeding and only some minor bruising that has healed. No new medications to report. [name] is stable on his current dose of 2.5 mg daily. He sees Dr. Truett Zhang today for follow up. Plan to continue same dose of coumadin and see [name] back in 2 months on Wednesday September 05, 2013 at 11:30am for lab and 11:45am for coumadin clinic

## 2013-07-09 NOTE — Progress Notes (Signed)
    [facility]    OFFICE PROGRESS NOTE   INTERVAL HISTORY:   He continues Coumadin anticoagulation. No symptom of recurrent thrombosis. No bleeding. No new complaint.  Objective:  Vital signs in last 24 hours:  Blood pressure 182/85, pulse 74, temperature 97.3 F (36.3 C), temperature source Oral, resp. rate 20, height 6\' 1"  (1.854 m), weight 244 lb 3.2 oz (110.768 kg).   Resp: Scattered end inspiratory coarse rhonchi, no respiratory distress, good air bilaterally Cardio: Regular rate and rhythm GI: Mild diffuse tenderness, no mass, no hepatosplenic Vascular: No edema, the left lower leg is larger than the right side   Lab Results:  PT/INR 1.9   Medications: I have reviewed the patient's current medications.  Assessment/Plan: 1. History of recurrent venous thrombosis. He is maintained on indefinite Coumadin anticoagulation. 2. Hypertension. Followed at [facility]. 3. Peripheral vascular disease. Followed at [facility].   Disposition:  He appears stable. The plan is to continue Coumadin anticoagulation. The Coumadin dosing is managed at the [facility] anticoagulation clinic. He will be scheduled for an office visit in one year.   [name], MD  07/09/2013  9:29 AM

## 2013-07-12 ENCOUNTER — Telehealth: Payer: Self-pay | Admitting: Oncology

## 2013-07-12 NOTE — Telephone Encounter (Signed)
S/w the pt regarding his 1 year f/u. Pt requested [place] to mail him an appt calendar.

## 2013-08-02 ENCOUNTER — Other Ambulatory Visit: Payer: Self-pay | Admitting: Oncology

## 2013-08-02 ENCOUNTER — Encounter: Payer: Self-pay | Admitting: *Deleted

## 2013-08-02 DIAGNOSIS — I82409 Acute embolism and thrombosis of unspecified deep veins of unspecified lower extremity: Secondary | ICD-10-CM

## 2013-08-07 ENCOUNTER — Encounter: Payer: Self-pay | Admitting: Cardiovascular Disease

## 2013-08-07 ENCOUNTER — Ambulatory Visit (INDEPENDENT_AMBULATORY_CARE_PROVIDER_SITE_OTHER): Payer: Medicare Other | Admitting: Cardiovascular Disease

## 2013-08-07 VITALS — BP 128/80 | HR 68 | Resp 16 | Ht 73.0 in | Wt 242.5 lb

## 2013-08-07 DIAGNOSIS — N183 Chronic kidney disease, stage 3 unspecified: Secondary | ICD-10-CM

## 2013-08-07 DIAGNOSIS — J449 Chronic obstructive pulmonary disease, unspecified: Secondary | ICD-10-CM

## 2013-08-07 DIAGNOSIS — M109 Gout, unspecified: Secondary | ICD-10-CM

## 2013-08-07 DIAGNOSIS — G4733 Obstructive sleep apnea (adult) (pediatric): Secondary | ICD-10-CM

## 2013-08-07 DIAGNOSIS — I701 Atherosclerosis of renal artery: Secondary | ICD-10-CM

## 2013-08-07 DIAGNOSIS — I1310 Hypertensive heart and chronic kidney disease without heart failure, with stage 1 through stage 4 chronic kidney disease, or unspecified chronic kidney disease: Secondary | ICD-10-CM

## 2013-08-07 DIAGNOSIS — N189 Chronic kidney disease, unspecified: Secondary | ICD-10-CM

## 2013-08-07 DIAGNOSIS — E785 Hyperlipidemia, unspecified: Secondary | ICD-10-CM

## 2013-08-07 DIAGNOSIS — I714 Abdominal aortic aneurysm, without rupture, unspecified: Secondary | ICD-10-CM

## 2013-08-07 DIAGNOSIS — E782 Mixed hyperlipidemia: Secondary | ICD-10-CM

## 2013-08-07 DIAGNOSIS — Z79899 Other long term (current) drug therapy: Secondary | ICD-10-CM

## 2013-08-07 DIAGNOSIS — I7409 Other arterial embolism and thrombosis of abdominal aorta: Secondary | ICD-10-CM

## 2013-08-07 DIAGNOSIS — R5381 Other malaise: Secondary | ICD-10-CM

## 2013-08-07 DIAGNOSIS — R9439 Abnormal result of other cardiovascular function study: Secondary | ICD-10-CM

## 2013-08-07 DIAGNOSIS — I82409 Acute embolism and thrombosis of unspecified deep veins of unspecified lower extremity: Secondary | ICD-10-CM

## 2013-08-07 DIAGNOSIS — I10 Essential (primary) hypertension: Secondary | ICD-10-CM

## 2013-08-07 MED ORDER — SIMVASTATIN 40 MG PO TABS: 40.0000 mg | ORAL_TABLET | Freq: Every day | ORAL | Status: DC

## 2013-08-07 MED ORDER — AMLODIPINE BESYLATE 2.5 MG PO TABS: 2.5000 mg | ORAL_TABLET | Freq: Every day | ORAL | Status: DC

## 2013-08-07 MED ORDER — LOSARTAN POTASSIUM-HCTZ 100-25 MG PO TABS: 1.0000 | ORAL_TABLET | Freq: Every day | ORAL | Status: DC

## 2013-08-07 MED ORDER — NEBIVOLOL HCL 20 MG PO TABS: 20.0000 mg | ORAL_TABLET | Freq: Every day | ORAL | Status: DC

## 2013-08-07 MED ORDER — FENOFIBRATE 160 MG PO TABS: 160.0000 mg | ORAL_TABLET | Freq: Every day | ORAL | Status: DC

## 2013-08-07 MED ORDER — BENAZEPRIL HCL 40 MG PO TABS: 40.0000 mg | ORAL_TABLET | Freq: Every day | ORAL | Status: DC

## 2013-08-07 MED ORDER — CILOSTAZOL 100 MG PO TABS: 100.0000 mg | ORAL_TABLET | Freq: Two times a day (BID) | ORAL | Status: DC

## 2013-08-07 NOTE — Patient Instructions (Signed)
Have FASTING lab work done one morning before you have anything to eat or drink.  Your physician has requested that you have an abdominal aorta duplex. During this test, an ultrasound is used to evaluate the aorta. Allow 30 minutes for this exam. Do not eat after midnight the day before and avoid carbonated beverages  Your physician recommends that you schedule a follow-up appointment in: ONE YEAR.

## 2013-08-18 DIAGNOSIS — J189 Pneumonia, unspecified organism: Secondary | ICD-10-CM

## 2013-08-18 HISTORY — DX: Pneumonia, unspecified organism: J18.9

## 2013-08-19 ENCOUNTER — Encounter: Payer: Self-pay | Admitting: Cardiovascular Disease

## 2013-08-19 DIAGNOSIS — J441 Chronic obstructive pulmonary disease with (acute) exacerbation: Secondary | ICD-10-CM | POA: Insufficient documentation

## 2013-08-19 DIAGNOSIS — I714 Abdominal aortic aneurysm, without rupture, unspecified: Secondary | ICD-10-CM | POA: Insufficient documentation

## 2013-08-19 DIAGNOSIS — J449 Chronic obstructive pulmonary disease, unspecified: Secondary | ICD-10-CM | POA: Insufficient documentation

## 2013-08-19 DIAGNOSIS — M109 Gout, unspecified: Secondary | ICD-10-CM | POA: Insufficient documentation

## 2013-08-19 DIAGNOSIS — I701 Atherosclerosis of renal artery: Secondary | ICD-10-CM | POA: Insufficient documentation

## 2013-08-19 DIAGNOSIS — E785 Hyperlipidemia, unspecified: Secondary | ICD-10-CM | POA: Insufficient documentation

## 2013-08-19 DIAGNOSIS — I1310 Hypertensive heart and chronic kidney disease without heart failure, with stage 1 through stage 4 chronic kidney disease, or unspecified chronic kidney disease: Secondary | ICD-10-CM | POA: Insufficient documentation

## 2013-08-19 DIAGNOSIS — R9439 Abnormal result of other cardiovascular function study: Secondary | ICD-10-CM | POA: Insufficient documentation

## 2013-08-19 DIAGNOSIS — I82409 Acute embolism and thrombosis of unspecified deep veins of unspecified lower extremity: Secondary | ICD-10-CM | POA: Insufficient documentation

## 2013-08-19 DIAGNOSIS — G4733 Obstructive sleep apnea (adult) (pediatric): Secondary | ICD-10-CM | POA: Insufficient documentation

## 2013-08-19 DIAGNOSIS — I7409 Other arterial embolism and thrombosis of abdominal aorta: Secondary | ICD-10-CM | POA: Insufficient documentation

## 2013-08-19 NOTE — Assessment & Plan Note (Addendum)
He has lifestyle limiting leg pain due to both intermittent claudication and degenerative arthritis. The latter improves with nonsteroidal anti-inflammatory drug therapy. ABI is 0.7 on the right and 0.8 on the left. He has been offered surgical revascularization of the iliac system but is not interested. Cilostazol has also been beneficial.

## 2013-08-19 NOTE — Assessment & Plan Note (Addendum)
Blood pressure control has been a serious challenge since he also developed significant orthostatic hypotension and has chronic kidney disease. It took multiple adjustments to arrive at the current regimen was used to provide satisfactory blood pressure control with fewer side effects. In the past we tried treatment with clonidine and other beta blockers unsuccessfully. He seems to need the alpha blocking component that diastolic offers. While carvedilol could provide similar mechanism of action. It may lead to worsening bronchospasm from his COPD.

## 2013-08-19 NOTE — Progress Notes (Signed)
Patient ID: Tony Zhang, male   DOB: [birth year], 67 y.o.   MRN: [id number]      Reason for office visit Followup peripheral artery disease, malignant hypertension, mixed hyperlipidemia , abdominal aortic aneurysm and terminal aortic occlusion  [name] returns today for routine followup. His usual complaints of moderate shortness of breath on exertion and intermittent claudication are unchanged.   Blood pressure control has been remarkably smooth. As always he has some symptoms of orthostatic dizziness but these are now expected and he takes the necessary precautions. He has not had any chest pain or palpitations or abdominal pain or bleeding problems.  He has severe PAD. He has a small abdominal aortic aneurysm in the infrarenal area and then the aorta is totally occluded above the iliac bifurcation. He has bilateral severely reduced ABIs of 0.7-0.8. There is collateral flow to the lower extremities only.  He is suspected to have coronary disease, possible occlusion of the right coronary artery as per findings of previous nuclear stress testing. Since he is free of angina and has preserved left ventricular systolic function this is being managed medically.  Recently his blood pressure control has been remarkably state. In the past we have had to make numerous adjustments to his antihypertensive regimen since he has occasionally extremely severe hypertension, but also symptomatic orthostatic hypotension. Repeated evaluation for renal artery stenosis has shown that he has non-critical lesions (1-59% stenoses bilaterally).   Allergies  Allergen Reactions  . Amlodipine Other (See Comments)    Muscle weakness  . Codeine     Current Outpatient Prescriptions  Medication Sig Dispense Refill  . Arginine 500 MG CAPS Take by mouth daily.       . benazepril (LOTENSIN) 40 MG tablet Take 1 tablet (40 mg total) by mouth daily.  90 tablet  3  . cilostazol (PLETAL) 100 MG tablet Take 1 tablet (100 mg  total) by mouth 2 (two) times daily.  180 tablet  3  . DHEA 25 MG CAPS Take 25 mg by mouth daily.      . fenofibrate 160 MG tablet Take 1 tablet (160 mg total) by mouth daily.  90 tablet  3  . ibuprofen (ADVIL,MOTRIN) 800 MG tablet 1 po bid prn  60 tablet  3  . LEVITRA 20 MG tablet Take 20 mg by mouth daily as needed.       [name] losartan-hydrochlorothiazide (HYZAAR) 100-25 MG per tablet Take 1 tablet by mouth daily.  90 tablet  3  . Nebivolol HCl (BYSTOLIC) 20 MG TABS Take 1 tablet (20 mg total) by mouth daily.  90 tablet  3  . Omega-3 Fatty Acids (OMEGA 3 PO) Take 1 capsule by mouth daily. Salmon containing fish oil      . warfarin (COUMADIN) 2.5 MG tablet TAKE 1 TABLET (2.5 MG TOTAL) BY MOUTH DAILY. OR AS DIRECTED  30 tablet  PRN  . amLODipine (NORVASC) 2.5 MG tablet Take 1 tablet (2.5 mg total) by mouth daily.  90 tablet  3  . simvastatin (ZOCOR) 40 MG tablet Take 1 tablet (40 mg total) by mouth at bedtime.  90 tablet  3   No current facility-administered medications for this visit.    Past Medical History  Diagnosis Date  . Venous thrombosis     Recurrent  . Peripheral vascular disease   . Dyslipidemia   . AAA (abdominal aortic aneurysm)   . COPD (chronic obstructive pulmonary disease)   . Gout   . DJD (degenerative joint  disease)   . Malignant hypertension     Past Surgical History  Procedure Laterality Date  . Back surgery    . Hernia repair      No family history on file.  History   Social History  . Marital Status: Single    Spouse Name: N/A    Number of Children: N/A  . Years of Education: N/A   Occupational History  . Not on file.   Social History Main Topics  . Smoking status: Former Games developer  . Smokeless tobacco: Not on file  . Alcohol Use: No  . Drug Use: No  . Sexual Activity: Not on file   Other Topics Concern  . Not on file   Social History Narrative  . No narrative on file    Review of systems: 1-2 block intermittent claudication. Leg pain  usually limits his activities more than his breathing. No angina syncope or palpitations. Severe arthralgias in his knees and hips. No new neurological complaints. No bleeding problems. No constipation, diarrhea or gastrointestinal bleeding. No abdominal pain. No hematuria or dysuria. Mild frequency and urgency. No fever, chills, weight changes, change in appetite. No mood swings. No skin rashes or other lesions. Chronic cough and shortness of breath with wheezing.  PHYSICAL EXAM BP 128/80  Pulse 68  Resp 16  Ht 6\' 1"  (1.854 m)  Wt 242 lb 8 oz (109.997 kg)  BMI 32.00 kg/m2  General: Alert, oriented x3, no distress Head: no evidence of trauma, PERRL, EOMI, no exophtalmos or lid lag, no myxedema, no xanthelasma; normal ears, nose and oropharynx Neck: normal jugular venous pulsations and no hepatojugular reflux; brisk carotid pulses without delay and no carotid bruits Chest:  Emphysematous changes,clear to auscultation, no signs of consolidation by percussion or palpation, normal fremitus, symmetrical and full respiratory excursions Cardiovascular: normal position and quality of the apical impulse, regular rhythm, normal first and second heart sounds, no murmurs, rubs or gallops Abdomen: no tenderness or distention, no masses by palpation, no abnormal pulsatility or arterial bruits, normal bowel sounds, no hepatosplenomegaly Extremities: no clubbing, cyanosis or edema; 2+ radial, ulnar and brachial pulses bilaterally; 1+ right femoral, nonpalpable posterior tibial and dorsalis pedis pulses; 1+ left femoral, nonpalpable posterior tibial and dorsalis pedis pulses; no subclavian or femoral bruits Neurological: grossly nonfocal   EKG: Sinus rhythm, pulmonary disease pattern, left axis deviation, left anterior fascicular block  Lipid Panel  #2013 total cholesterol 163, triglycerides 155, HDL 32, LDL 100 Creatinine 1.27, potassium 3.8, normal liver function tests, uric acid 6.3  BMET    Component  Value Date/Time   NA 136 02/21/2010 0430   K 4.6 02/21/2010 0430   CL 108 02/21/2010 0430   CO2 19 02/21/2010 0430   GLUCOSE 110* 02/21/2010 0430   BUN 26* 02/21/2010 0430   CREATININE 1.49 02/21/2010 0430   CALCIUM 8.5 02/21/2010 0430   GFRNONAA 47* 02/21/2010 0430   GFRAA  Value: 57        The eGFR has been calculated using the MDRD equation. This calculation has not been validated in all clinical situations. eGFR's persistently <60 mL/min signify possible Chronic Kidney Disease.* 02/21/2010 0430     ASSESSMENT AND PLAN Chronic distal aortic occlusion He has lifestyle limiting leg pain due to both intermittent claudication and degenerative arthritis. The latter improves with nonsteroidal anti-inflammatory drug therapy. ABI is 0.7 on the right and 0.8 on the left. He has been offered surgical revascularization of the iliac system but is not interested. Cilostazol has  also been beneficial.  AAA (abdominal aortic aneurysm) Above the level of his aortic occlusion he has a 3.5 cm  infrarenal abdominal aortic aneurysm that needs periodic reevaluation  Hyperlipidemia He has not tolerated other statins as well as the simvastatin. He does tolerate the combination of statin and fibrates reasonably well.  Gout No recent attacks. If possible need to avoid higher doses of diuretics  Malignant HTN with heart disease, w/o CHF, with chronic kidney disease  Blood pressure control has been a serious challenge since he also developed significant orthostatic hypotension and has chronic kidney disease. It took multiple adjustments to arrive at the current regimen was used to provide satisfactory blood pressure control with fewer side effects. In the past we tried treatment with clonidine and other beta blockers unsuccessfully. He seems to need the alpha blocking component that diastolic offers. While carvedilol could provide similar mechanism of action. It may lead to worsening bronchospasm from his COPD.  Abnormal nuclear  stress test A nuclear stress test performed in 2009 shows inferior wall perfusion abnormality that is mostly fixed with some peri-infarct ischemia. He does not have angina pectoris. Vascular access is limited by occlusion of his aorta. He has preserved left ventricular systolic function. He has never undergone coronary angiography.  Bilateral renal artery stenosis Bilateral less than 60% lesions, stable over many years.  CKD (chronic kidney disease) stage 3, GFR 30-59 ml/min Baseline creatinine around 1.3   Orders Placed This Encounter  Procedures  . CBC  . Comp Met (CMET)  . Lipid Profile  . EKG 12-Lead  . Abdominal Aortic Aneurysm duplex   Meds ordered this encounter  Medications  . DISCONTD: amLODipine (NORVASC) 2.5 MG tablet    Sig: Take 2.5 mg by mouth daily.  [name] amLODipine (NORVASC) 2.5 MG tablet    Sig: Take 1 tablet (2.5 mg total) by mouth daily.    Dispense:  90 tablet    Refill:  3  . benazepril (LOTENSIN) 40 MG tablet    Sig: Take 1 tablet (40 mg total) by mouth daily.    Dispense:  90 tablet    Refill:  3  . cilostazol (PLETAL) 100 MG tablet    Sig: Take 1 tablet (100 mg total) by mouth 2 (two) times daily.    Dispense:  180 tablet    Refill:  3  . fenofibrate 160 MG tablet    Sig: Take 1 tablet (160 mg total) by mouth daily.    Dispense:  90 tablet    Refill:  3    Patient needs to schedule appointment for future refills.  [name] losartan-hydrochlorothiazide (HYZAAR) 100-25 MG per tablet    Sig: Take 1 tablet by mouth daily.    Dispense:  90 tablet    Refill:  3  . Nebivolol HCl (BYSTOLIC) 20 MG TABS    Sig: Take 1 tablet (20 mg total) by mouth daily.    Dispense:  90 tablet    Refill:  3  . simvastatin (ZOCOR) 40 MG tablet    Sig: Take 1 tablet (40 mg total) by mouth at bedtime.    Dispense:  90 tablet    Refill:  3    Latrise Bowland  Thurmon Fair, MD, [facility] [phone] office [phone] pager

## 2013-08-19 NOTE — Assessment & Plan Note (Signed)
Above the level of his aortic occlusion he has a 3.5 cm  infrarenal abdominal aortic aneurysm that needs periodic reevaluation

## 2013-08-19 NOTE — Assessment & Plan Note (Signed)
Bilateral less than 60% lesions, stable over many years.

## 2013-08-19 NOTE — Assessment & Plan Note (Signed)
A nuclear stress test performed in 2009 shows inferior wall perfusion abnormality that is mostly fixed with some peri-infarct ischemia. He does not have angina pectoris. Vascular access is limited by occlusion of his aorta. He has preserved left ventricular systolic function. He has never undergone coronary angiography.

## 2013-08-19 NOTE — Assessment & Plan Note (Addendum)
He has not tolerated other statins as well as the simvastatin. He does tolerate the combination of statin and fibrates reasonably well.

## 2013-08-19 NOTE — Assessment & Plan Note (Signed)
Baseline creatinine around 1.3

## 2013-08-19 NOTE — Assessment & Plan Note (Signed)
No recent attacks. If possible need to avoid higher doses of diuretics

## 2013-08-21 ENCOUNTER — Telehealth: Payer: Self-pay | Admitting: *Deleted

## 2013-08-21 MED ORDER — CARVEDILOL 12.5 MG PO TABS: 12.5000 mg | ORAL_TABLET | Freq: Two times a day (BID) | ORAL | Status: DC

## 2013-08-21 NOTE — Telephone Encounter (Signed)
Patient notified his insurance will not cover Bystolic in 2015 and per Dr. Salena Saner he will switch it to Carvedilol 12.5mg  bid.  New Rx sent to pharmacy . Has enough Bystolic to last until 2015.  Patient voiced understanding.

## 2013-09-05 ENCOUNTER — Other Ambulatory Visit (HOSPITAL_BASED_OUTPATIENT_CLINIC_OR_DEPARTMENT_OTHER): Payer: Medicare Other | Admitting: Lab

## 2013-09-05 ENCOUNTER — Ambulatory Visit (HOSPITAL_BASED_OUTPATIENT_CLINIC_OR_DEPARTMENT_OTHER): Payer: Self-pay | Admitting: Pharmacist

## 2013-09-05 DIAGNOSIS — I82402 Acute embolism and thrombosis of unspecified deep veins of left lower extremity: Secondary | ICD-10-CM

## 2013-09-05 DIAGNOSIS — I82409 Acute embolism and thrombosis of unspecified deep veins of unspecified lower extremity: Secondary | ICD-10-CM

## 2013-09-05 LAB — PROTIME-INR
INR: 2.8 (ref 2.00–3.50)
Protime: 33.6 Seconds — ABNORMAL HIGH (ref 10.6–13.4)

## 2013-09-05 LAB — POCT INR: INR: 2.8

## 2013-09-05 NOTE — Progress Notes (Signed)
INR continues to be at goal of 2-3.  No changes in medications.  No bleeding or bruising.  No upcoming procedures.  Continue coumadin 2.5mg  daily and check PT/INR in 2 months.

## 2013-09-09 ENCOUNTER — Emergency Department (HOSPITAL_COMMUNITY): Payer: Medicare Other

## 2013-09-09 ENCOUNTER — Inpatient Hospital Stay (HOSPITAL_COMMUNITY)
Admission: EM | Admit: 2013-09-09 | Discharge: 2013-09-12 | DRG: 194 | Disposition: A | Discharge status: Home / Self Care | Payer: Medicare Other | Attending: Internal Medicine | Admitting: Internal Medicine

## 2013-09-09 ENCOUNTER — Encounter (HOSPITAL_COMMUNITY): Payer: Self-pay | Admitting: Emergency Medicine

## 2013-09-09 DIAGNOSIS — Z23 Encounter for immunization: Secondary | ICD-10-CM

## 2013-09-09 DIAGNOSIS — R112 Nausea with vomiting, unspecified: Secondary | ICD-10-CM | POA: Diagnosis present

## 2013-09-09 DIAGNOSIS — J4489 Other specified chronic obstructive pulmonary disease: Secondary | ICD-10-CM | POA: Diagnosis present

## 2013-09-09 DIAGNOSIS — J189 Pneumonia, unspecified organism: Principal | ICD-10-CM

## 2013-09-09 DIAGNOSIS — I739 Peripheral vascular disease, unspecified: Secondary | ICD-10-CM | POA: Diagnosis present

## 2013-09-09 DIAGNOSIS — Z87891 Personal history of nicotine dependence: Secondary | ICD-10-CM

## 2013-09-09 DIAGNOSIS — Z79899 Other long term (current) drug therapy: Secondary | ICD-10-CM

## 2013-09-09 DIAGNOSIS — Z86718 Personal history of other venous thrombosis and embolism: Secondary | ICD-10-CM

## 2013-09-09 DIAGNOSIS — I129 Hypertensive chronic kidney disease with stage 1 through stage 4 chronic kidney disease, or unspecified chronic kidney disease: Secondary | ICD-10-CM

## 2013-09-09 DIAGNOSIS — I1310 Hypertensive heart and chronic kidney disease without heart failure, with stage 1 through stage 4 chronic kidney disease, or unspecified chronic kidney disease: Secondary | ICD-10-CM

## 2013-09-09 DIAGNOSIS — J449 Chronic obstructive pulmonary disease, unspecified: Secondary | ICD-10-CM

## 2013-09-09 DIAGNOSIS — J441 Chronic obstructive pulmonary disease with (acute) exacerbation: Secondary | ICD-10-CM | POA: Diagnosis present

## 2013-09-09 DIAGNOSIS — M109 Gout, unspecified: Secondary | ICD-10-CM | POA: Diagnosis present

## 2013-09-09 DIAGNOSIS — K219 Gastro-esophageal reflux disease without esophagitis: Secondary | ICD-10-CM | POA: Diagnosis present

## 2013-09-09 DIAGNOSIS — Z7901 Long term (current) use of anticoagulants: Secondary | ICD-10-CM

## 2013-09-09 DIAGNOSIS — D649 Anemia, unspecified: Secondary | ICD-10-CM | POA: Diagnosis present

## 2013-09-09 DIAGNOSIS — N183 Chronic kidney disease, stage 3 unspecified: Secondary | ICD-10-CM | POA: Diagnosis present

## 2013-09-09 DIAGNOSIS — E785 Hyperlipidemia, unspecified: Secondary | ICD-10-CM | POA: Diagnosis present

## 2013-09-09 DIAGNOSIS — I714 Abdominal aortic aneurysm, without rupture, unspecified: Secondary | ICD-10-CM | POA: Diagnosis present

## 2013-09-09 DIAGNOSIS — J690 Pneumonitis due to inhalation of food and vomit: Secondary | ICD-10-CM

## 2013-09-09 DIAGNOSIS — N179 Acute kidney failure, unspecified: Secondary | ICD-10-CM | POA: Diagnosis present

## 2013-09-09 HISTORY — DX: Pneumonia, unspecified organism: J18.9

## 2013-09-09 HISTORY — DX: Shortness of breath: R06.02

## 2013-09-09 LAB — COMPREHENSIVE METABOLIC PANEL
ALT: 13 U/L (ref 0–53)
AST: 23 U/L (ref 0–37)
Albumin: 3.6 g/dL (ref 3.5–5.2)
BUN: 25 mg/dL — ABNORMAL HIGH (ref 6–23)
CO2: 20 mEq/L (ref 19–32)
Calcium: 9.1 mg/dL (ref 8.4–10.5)
Chloride: 103 mEq/L (ref 96–112)
Creatinine, Ser: 2.42 mg/dL — ABNORMAL HIGH (ref 0.50–1.35)
GFR calc Af Amer: 30 mL/min — ABNORMAL LOW (ref >90)
GFR calc non Af Amer: 26 mL/min — ABNORMAL LOW (ref >90)
Glucose, Bld: 163 mg/dL — ABNORMAL HIGH (ref 70–99)
Sodium: 138 mEq/L (ref 135–145)
Total Bilirubin: 0.8 mg/dL (ref 0.3–1.2)
Total Protein: 6.9 g/dL (ref 6.0–8.3)

## 2013-09-09 LAB — CBC WITH DIFFERENTIAL/PLATELET
Basophils Absolute: 0 K/uL (ref 0.0–0.1)
Basophils Relative: 0 % (ref 0–1)
Eosinophils Absolute: 0 K/uL (ref 0.0–0.7)
Eosinophils Relative: 0 % (ref 0–5)
HCT: 40.1 % (ref 39.0–52.0)
Hemoglobin: 14.7 g/dL (ref 13.0–17.0)
Lymphocytes Relative: 3 % — ABNORMAL LOW (ref 12–46)
Lymphs Abs: 0.7 K/uL (ref 0.7–4.0)
MCH: 31.7 pg (ref 26.0–34.0)
MCHC: 36.7 g/dL — ABNORMAL HIGH (ref 30.0–36.0)
MCV: 86.6 fL (ref 78.0–100.0)
Monocytes Absolute: 1.1 K/uL — ABNORMAL HIGH (ref 0.1–1.0)
Monocytes Relative: 5 % (ref 3–12)
Neutro Abs: 20.2 K/uL — ABNORMAL HIGH (ref 1.7–7.7)
Neutrophils Relative %: 92 % — ABNORMAL HIGH (ref 43–77)
Platelets: 295 K/uL (ref 150–400)
RBC: 4.63 MIL/uL (ref 4.22–5.81)
RDW: 15 % (ref 11.5–15.5)
WBC Morphology: INCREASED
WBC: 22 K/uL — ABNORMAL HIGH (ref 4.0–10.5)

## 2013-09-09 LAB — PROTIME-INR
INR: 2.39 — ABNORMAL HIGH (ref 0.00–1.49)
Prothrombin Time: 25.3 seconds — ABNORMAL HIGH (ref 11.6–15.2)

## 2013-09-09 LAB — LIPASE, BLOOD: Lipase: 37 U/L (ref 11–59)

## 2013-09-09 LAB — POCT I-STAT TROPONIN I: Troponin i, poc: 0.02 ng/mL (ref 0.00–0.08)

## 2013-09-09 MED ORDER — WARFARIN SODIUM 2.5 MG PO TABS
2.5000 mg | ORAL_TABLET | Freq: Every day | ORAL | Status: DC
  Administered 2013-09-09: 2.5 mg via ORAL
  Filled 2013-09-09 (×2): qty 1

## 2013-09-09 MED ORDER — SODIUM CHLORIDE 0.9 % IV BOLUS (SEPSIS)
1000.0000 mL | Freq: Once | INTRAVENOUS | Status: AC
  Administered 2013-09-09: 1000 mL via INTRAVENOUS

## 2013-09-09 MED ORDER — PIPERACILLIN-TAZOBACTAM 3.375 G IVPB 30 MIN
3.3750 g | Freq: Once | INTRAVENOUS | Status: AC
  Administered 2013-09-09: 3.375 g via INTRAVENOUS
  Filled 2013-09-09: qty 50

## 2013-09-09 MED ORDER — ALBUTEROL SULFATE HFA 108 (90 BASE) MCG/ACT IN AERS: 1.0000 | INHALATION_SPRAY | Freq: Four times a day (QID) | RESPIRATORY_TRACT | Status: DC | PRN

## 2013-09-09 MED ORDER — WARFARIN - PHYSICIAN DOSING INPATIENT: Freq: Every day | Status: DC

## 2013-09-09 MED ORDER — FENOFIBRATE 160 MG PO TABS
160.0000 mg | ORAL_TABLET | Freq: Every day | ORAL | Status: DC
  Administered 2013-09-10 – 2013-09-12 (×3): 160 mg via ORAL
  Filled 2013-09-09 (×3): qty 1

## 2013-09-09 MED ORDER — CILOSTAZOL 100 MG PO TABS
100.0000 mg | ORAL_TABLET | Freq: Two times a day (BID) | ORAL | Status: DC
  Administered 2013-09-09 – 2013-09-12 (×6): 100 mg via ORAL
  Filled 2013-09-09 (×7): qty 1

## 2013-09-09 MED ORDER — SIMVASTATIN 40 MG PO TABS
40.0000 mg | ORAL_TABLET | Freq: Every day | ORAL | Status: DC
  Administered 2013-09-09 – 2013-09-11 (×3): 40 mg via ORAL
  Filled 2013-09-09 (×4): qty 1

## 2013-09-09 MED ORDER — MORPHINE SULFATE 4 MG/ML IJ SOLN
6.0000 mg | Freq: Once | INTRAMUSCULAR | Status: AC
  Administered 2013-09-09: 6 mg via INTRAVENOUS
  Filled 2013-09-09: qty 2

## 2013-09-09 MED ORDER — DEXTROSE 5 % IV SOLN
1.0000 g | INTRAVENOUS | Status: DC
  Administered 2013-09-09 – 2013-09-10 (×2): 1 g via INTRAVENOUS
  Filled 2013-09-09 (×3): qty 10

## 2013-09-09 MED ORDER — ONDANSETRON HCL 4 MG/2ML IJ SOLN
4.0000 mg | Freq: Once | INTRAMUSCULAR | Status: AC
  Administered 2013-09-09: 4 mg via INTRAVENOUS
  Filled 2013-09-09: qty 2

## 2013-09-09 MED ORDER — MORPHINE SULFATE 2 MG/ML IJ SOLN
2.0000 mg | INTRAMUSCULAR | Status: DC | PRN
  Administered 2013-09-09 – 2013-09-12 (×13): 2 mg via INTRAVENOUS
  Filled 2013-09-09 (×13): qty 1

## 2013-09-09 MED ORDER — AZITHROMYCIN 500 MG PO TABS
500.0000 mg | ORAL_TABLET | Freq: Every day | ORAL | Status: DC
  Administered 2013-09-09 – 2013-09-12 (×4): 500 mg via ORAL
  Filled 2013-09-09 (×4): qty 1

## 2013-09-09 NOTE — ED Provider Notes (Signed)
CSN: [id number]     Arrival date & time 09/09/13  1124 History   First MD Initiated Contact with Patient 09/09/13 1124     Chief Complaint  Patient presents with  . Emesis   (Consider location/radiation/quality/duration/timing/severity/associated sxs/prior Treatment) The history is provided by the patient.  ARMON ORVIS is a 67 y.o. male history of AAA, hyperlipidemia, PE on Coumadin here presenting with vomiting and cough. He ate [employer] fish around 3 PM yesterday. Around 11 PM he started vomiting. He felt an acid feeling in his mouth vomited numerous times. He also was coughing up mucus with blood tinged as well. He thought he may have aspirated as well but denies any fevers. Has been on Coumadin for PE and last INR 3 days ago was 2.8.    Past Medical History  Diagnosis Date  . Venous thrombosis     Recurrent  . Peripheral vascular disease   . Dyslipidemia   . AAA (abdominal aortic aneurysm)   . COPD (chronic obstructive pulmonary disease)   . Gout   . DJD (degenerative joint disease)   . Malignant hypertension    Past Surgical History  Procedure Laterality Date  . Back surgery    . Hernia repair     History reviewed. No pertinent family history. History  Substance Use Topics  . Smoking status: Former Games developer  . Smokeless tobacco: Not on file  . Alcohol Use: No    Review of Systems  Gastrointestinal: Positive for vomiting.  All other systems reviewed and are negative.    Allergies  Amlodipine and Codeine  Home Medications   Current Outpatient Rx  Name  Route  Sig  Dispense  Refill  . albuterol (PROVENTIL HFA;VENTOLIN HFA) 108 (90 BASE) MCG/ACT inhaler   Inhalation   Inhale 1 puff into the lungs every 6 (six) hours as needed for wheezing or shortness of breath.         [name] amLODipine (NORVASC) 2.5 MG tablet   Oral   Take 1 tablet (2.5 mg total) by mouth daily.   90 tablet   3   . benazepril (LOTENSIN) 40 MG tablet   Oral   Take 1 tablet (40 mg  total) by mouth daily.   90 tablet   3   . carvedilol (COREG) 12.5 MG tablet   Oral   Take 1 tablet (12.5 mg total) by mouth 2 (two) times daily.   180 tablet   3     This replaces Bystolic which insurance will not co ...   . cilostazol (PLETAL) 100 MG tablet   Oral   Take 1 tablet (100 mg total) by mouth 2 (two) times daily.   180 tablet   3   . fenofibrate 160 MG tablet   Oral   Take 1 tablet (160 mg total) by mouth daily.   90 tablet   3     Patient needs to schedule appointment for future r ...   . ibuprofen (ADVIL,MOTRIN) 800 MG tablet      1 po bid prn   60 tablet   3   . LEVITRA 20 MG tablet   Oral   Take 20 mg by mouth daily as needed.          [name] losartan-hydrochlorothiazide (HYZAAR) 100-25 MG per tablet   Oral   Take 1 tablet by mouth daily.   90 tablet   3   . nebivolol (BYSTOLIC) 10 MG tablet   Oral   Take  20 mg by mouth daily.         . simvastatin (ZOCOR) 40 MG tablet   Oral   Take 1 tablet (40 mg total) by mouth at bedtime.   90 tablet   3   . warfarin (COUMADIN) 2.5 MG tablet   Oral   Take 2.5 mg by mouth daily.          BP 114/65  Pulse 96  Temp(Src) 97.5 F (36.4 C) (Oral)  Resp 18  Ht 6\' 1"  (1.854 m)  Wt 244 lb (110.678 kg)  BMI 32.20 kg/m2  SpO2 93% Physical Exam  Nursing note and vitals reviewed. Constitutional: He is oriented to person, place, and time.  Chronically ill, vomiting   HENT:  Head: Normocephalic.  Mouth/Throat: Oropharynx is clear and moist.  Eyes: Conjunctivae are normal. Pupils are equal, round, and reactive to light.  Neck: Normal range of motion. Neck supple.  Cardiovascular: Regular rhythm and normal heart sounds.   Tachycardic   Pulmonary/Chest:  Slightly tachypneic, + crackles on R lung base   Abdominal: Soft.  Slightly distended, good bowel sounds. Mild diffuse tenderness, worse in epigastrium, no rebound   Musculoskeletal: Normal range of motion.  Neurological: He is alert and oriented  to person, place, and time.  Skin: Skin is warm and dry.  Psychiatric: He has a normal mood and affect. His behavior is normal. Judgment and thought content normal.    ED Course  Procedures (including critical care time) Labs Review Labs Reviewed  CBC WITH DIFFERENTIAL - Abnormal; Notable for the following:    WBC 22.0 (*)    MCHC 36.7 (*)    Neutrophils Relative % 92 (*)    Lymphocytes Relative 3 (*)    Neutro Abs 20.2 (*)    Monocytes Absolute 1.1 (*)    All other components within normal limits  COMPREHENSIVE METABOLIC PANEL - Abnormal; Notable for the following:    Potassium 3.4 (*)    Glucose, Bld 163 (*)    BUN 25 (*)    Creatinine, Ser 2.42 (*)    Alkaline Phosphatase 35 (*)    GFR calc non Af Amer 26 (*)    GFR calc Af Amer 30 (*)    All other components within normal limits  PROTIME-INR - Abnormal; Notable for the following:    Prothrombin Time 25.3 (*)    INR 2.39 (*)    All other components within normal limits  CULTURE, BLOOD (ROUTINE X 2)  CULTURE, BLOOD (ROUTINE X 2)  LIPASE, BLOOD  POCT I-STAT TROPONIN I  CG4 I-STAT (LACTIC ACID)   Imaging Review Dg Chest 2 View  09/09/2013   CLINICAL DATA:  Chest pain, cough  EXAM: CHEST  2 VIEW  COMPARISON:  02/20/2010  FINDINGS: An ill-defined infiltrate projects within the right middle lobe and right lower lobe. This finding partially silhouettes the heart border. Cardiac silhouette is otherwise within normal limits. Discoid atelectasis projects in the periphery of the lingula. No further focal reason consolidation no focal infiltrate appreciated. Visualized osseous structures are unremarkable.  IMPRESSION: Right middle lobe and right lower lobe infiltrate surveillance evaluation recommended status post appropriate therapeutic management. If these findings persist further evaluation chest CT recommended.   Electronically Signed   By: Salome Holmes M.D.   On: 09/09/2013 12:45   Dg Cervical Spine Complete  09/09/2013    CLINICAL DATA:  History of and neck pain, no reported history of trauma  EXAM: CERVICAL SPINE  4+ VIEWS  COMPARISON:  None.  FINDINGS: There is no evidence of cervical spine fracture or prevertebral soft tissue swelling. Alignment is normal. No other significant bone abnormalities are identified.  IMPRESSION: Negative cervical spine radiographs.   Electronically Signed   By: Salome Holmes M.D.   On: 09/09/2013 12:48   Dg Abd 1 View  09/09/2013   CLINICAL DATA:  Abdominal pain  EXAM: ABDOMEN - 1 VIEW  COMPARISON:  Correlated with CT 11/23/2009  FINDINGS: There is a paucity of bowel gas. Air is seen within nondistended loops of large bowel. Moderate amount of stools appreciated. Visualized bony skeleton is unremarkable. Calcified densities project within the left upper quadrant appears to be above the level of the left renal fossa repeat consistent with splenic artery calcifications.  IMPRESSION: Nonobstructive bowel gas pattern with moderate amount of stool.   Electronically Signed   By: Salome Holmes M.D.   On: 09/09/2013 12:47    EKG Interpretation   None      Date: 09/09/2013  Rate: 102  Rhythm: sinus tachycardia  QRS Axis: normal  Intervals: normal  ST/T Wave abnormalities: nonspecific ST changes  Conduction Disutrbances:none  Narrative Interpretation:   Old EKG Reviewed: unchanged    MDM  No diagnosis found. EUSEVIO SCHRIVER is a 67 y.o. male here with vomiting and hemoptysis. Concerned for possible aspiration. Concerned about hemoptysis in the setting of coumadin use. Will get labs, INR, cxr.   1:50 PM cxr showed aspiration pneumonia. Given zosyn and cultures sent. WBC elevated at 22. Will admit for aspiration pneumonia under internal medicine teaching service.      Richardean Canal, MD 09/09/13 1351

## 2013-09-09 NOTE — H&P (Signed)
Date: 09/09/2013               Patient Name:  Tony Zhang MRN: [id number]  DOB: 03/19/1946 Age / Sex: 67 y.o., male   PCP: [name], MD         Medical Service: Internal Medicine Teaching Service         Attending Physician: Dr. Inez Catalina, MD    First Contact: Dr. Evelena Peat Pager: [phone]  Second Contact: Dr. Christen Bame Pager: [phone]       After Hours (After 5p/  First Contact Pager: [phone]  weekends / holidays): Second Contact Pager: [phone]   Chief Complaint: cough, R sided rib pain and SOB  History of Present Illness: Tony Zhang is a 67 year old male with a PMH of COPD, CKD 3, HTN, PVD and DVT (on coumadin and follows with Dr. Truett Perna).  He presents with complaint of cough and right sided pleuritic chest/rib pain that started last night.  He says he ate a fish sandwich yesterday and later that night he experienced acid reflux.  He then vomited twice and began to cough up blood.  He reports some SOB and says his cough continued all night.  He denies recent travel or sick contacts, lower extremity pain or swelling.  He reports compliance with his coumadin and reports his last INR was 2.8, measured four days ago.  In the ED:  97.71F, BP 113/60, HR 105, RR 20, SpO2 98%; he received 2L NSS bolus, 4mg  Zofran, Zosyn, 6mg  morphine.   Meds: Current Facility-Administered Medications  Medication Dose Route Frequency Provider Last Rate Last Dose  . albuterol (PROVENTIL HFA;VENTOLIN HFA) 108 (90 BASE) MCG/ACT inhaler 1 puff  1 puff Inhalation Q6H PRN Christen Bame, MD      . azithromycin (ZITHROMAX) tablet 500 mg  500 mg Oral Q24H Christen Bame, MD      . cefTRIAXone (ROCEPHIN) 1 g in dextrose 5 % 50 mL IVPB  1 g Intravenous Q24H Christen Bame, MD      . cilostazol (PLETAL) tablet 100 mg  100 mg Oral BID Christen Bame, MD      . Melene Muller ON 09/10/2013] fenofibrate tablet 160 mg  160 mg Oral Daily Christen Bame, MD      . simvastatin (ZOCOR) tablet 40 mg  40 mg Oral QHS Christen Bame, MD      .  warfarin (COUMADIN) tablet 2.5 mg  2.5 mg Oral q1800 Christen Bame, MD      . Warfarin - Physician Dosing Inpatient   Does not apply q1800 Severiano Gilbert, [facility]        Allergies: Allergies as of 09/09/2013 - Review Complete 09/09/2013  Allergen Reaction Noted  . Amlodipine Other (See Comments) 07/09/2013  . Codeine  09/29/2011   Past Medical History  Diagnosis Date  . Venous thrombosis     Recurrent  . Peripheral vascular disease   . Dyslipidemia   . AAA (abdominal aortic aneurysm)   . COPD (chronic obstructive pulmonary disease)   . Gout   . DJD (degenerative joint disease)   . Malignant hypertension    Past Surgical History  Procedure Laterality Date  . Back surgery    . Hernia repair     History reviewed. No pertinent family history. History   Social History  . Marital Status: Divorced    Spouse Name: N/A    Number of Children: N/A  . Years of Education: N/A   Occupational History  .  Not on file.   Social History Main Topics  . Smoking status: Former Games developer  . Smokeless tobacco: Not on file  . Alcohol Use: No  . Drug Use: No  . Sexual Activity: Not on file   Other Topics Concern  . Not on file   Social History Narrative  . No narrative on file    Review of Systems: Pertinent items are noted in HPI.  Physical Exam: Blood pressure 97/47, pulse 88, temperature 97.7 F (36.5 C), temperature source Oral, resp. rate 19, height 6\' 1"  (1.854 m), weight 110.678 kg (244 lb), SpO2 95.00%. General: resting in bed in NAD HEENT: PERRL, EOMI, no scleral icterus, +cough Cardiac: RRR, no rubs, murmurs or gallops, +right sided ribs TTP Pulm: clear to auscultation bilaterally, moving normal volumes of air Abd: soft, nontender, nondistended, BS present Ext: warm and well perfused, no pedal edema Neuro: alert and oriented X3, cranial nerves II-XII grossly intact  Lab results: Basic Metabolic Panel:  Recent Labs  16/10/96 1136  NA 138  K 3.4*  CL 103  CO2 20    GLUCOSE 163*  BUN 25*  CREATININE 2.42*  CALCIUM 9.1   Liver Function Tests:  Recent Labs  09/09/13 1136  AST 23  ALT 13  ALKPHOS 35*  BILITOT 0.8  PROT 6.9  ALBUMIN 3.6    Recent Labs  09/09/13 1136  LIPASE 37   CBC:  Recent Labs  09/09/13 1136  WBC 22.0*  NEUTROABS 20.2*  HGB 14.7  HCT 40.1  MCV 86.6  PLT 295   Coagulation:  Recent Labs  09/09/13 1136  LABPROT 25.3*  INR 2.39*   Imaging results:  Dg Chest 2 View  09/09/2013   CLINICAL DATA:  Chest pain, cough  EXAM: CHEST  2 VIEW  COMPARISON:  02/20/2010  FINDINGS: An ill-defined infiltrate projects within the right middle lobe and right lower lobe. This finding partially silhouettes the heart border. Cardiac silhouette is otherwise within normal limits. Discoid atelectasis projects in the periphery of the lingula. No further focal reason consolidation no focal infiltrate appreciated. Visualized osseous structures are unremarkable.  IMPRESSION: Right middle lobe and right lower lobe infiltrate surveillance evaluation recommended status post appropriate therapeutic management. If these findings persist further evaluation chest CT recommended.   Electronically Signed   By: Salome Holmes M.D.   On: 09/09/2013 12:45   Dg Cervical Spine Complete  09/09/2013   CLINICAL DATA:  History of and neck pain, no reported history of trauma  EXAM: CERVICAL SPINE  4+ VIEWS  COMPARISON:  None.  FINDINGS: There is no evidence of cervical spine fracture or prevertebral soft tissue swelling. Alignment is normal. No other significant bone abnormalities are identified.  IMPRESSION: Negative cervical spine radiographs.   Electronically Signed   By: Salome Holmes M.D.   On: 09/09/2013 12:48   Dg Abd 1 View  09/09/2013   CLINICAL DATA:  Abdominal pain  EXAM: ABDOMEN - 1 VIEW  COMPARISON:  Correlated with CT 11/23/2009  FINDINGS: There is a paucity of bowel gas. Air is seen within nondistended loops of large bowel. Moderate amount of  stools appreciated. Visualized bony skeleton is unremarkable. Calcified densities project within the left upper quadrant appears to be above the level of the left renal fossa repeat consistent with splenic artery calcifications.  IMPRESSION: Nonobstructive bowel gas pattern with moderate amount of stool.   Electronically Signed   By: Salome Holmes M.D.   On: 09/09/2013 12:47   Assessment & Plan  by Problem: 67 year old male with a PMH of COPD, CKD 3, HTN, PVD and DVT (on coumadin and follows with Dr. Truett Perna) presenting with cough and pleuritic CP concerning for CAP.  #CAP  Cough, SOB and pleuritic right-sided CP.  Differential includes pneumonia, PE, ACS.  Wells score 2.5 (moderate risk), however PE unlikely given patient on coumadin with an INR of 2.39 in the ED.  ACS less likely given right sided pleuritic chest pain, tenderness to palpation, negative troponin and no ischemic changes on EKG.  Leukocytosis,  CXR findings and clinical picture concerning for pneumonia.     - admit to IMTS on telemetry - azithromycin and ceftriaxone for CAP - urine strep and legionella - sputum culture and gram stain - supplemental oxygen, keep sats > 92%  #COPD - no evidence of acute exacerbation, continue albuterol  #AKI on CKD3 - baseline Cr 1.3-1.4, Cr at admission 2.42.  Likely secondary to vomiting. - hold benazepril  #Hypertension - normotensive at present; will hold antihypertensives   #Hx of DVT - continue coumadin  #PVD - continue cilostazol  #Diet - renal   #DVT ppx - coumadin  Dispo: Disposition is deferred at this time, awaiting improvement of current medical problems. Anticipated discharge in approximately 1-2 day(s).   The patient does have a current PCP [name], MD) and does need an [facility] hospital follow-up appointment after discharge.  The patient does not know have transportation limitations that hinder transportation to clinic appointments.  Signed: Evelena Peat,  DO 09/09/2013, 3:38 PM

## 2013-09-09 NOTE — ED Notes (Signed)
Patient returned from X-ray 

## 2013-09-09 NOTE — ED Notes (Addendum)
Ate fish from [employer] last night; then started vomiting about 2330. At 0530 felt acid reflux come up in his mouth. Then coughing up spots of blood. On coumadin. Reports pain in right rib cage and right side of neck.

## 2013-09-09 NOTE — ED Notes (Signed)
Patient transported to X-ray 

## 2013-09-10 ENCOUNTER — Inpatient Hospital Stay (HOSPITAL_COMMUNITY): Payer: Medicare Other

## 2013-09-10 LAB — URINALYSIS, ROUTINE W REFLEX MICROSCOPIC
Glucose, UA: NEGATIVE mg/dL
Hgb urine dipstick: NEGATIVE
Leukocytes, UA: NEGATIVE
Nitrite: NEGATIVE
Protein, ur: NEGATIVE mg/dL
Specific Gravity, Urine: 1.012 (ref 1.005–1.030)
pH: 5.5 (ref 5.0–8.0)

## 2013-09-10 LAB — BASIC METABOLIC PANEL
CO2: 21 mEq/L (ref 19–32)
Calcium: 7.6 mg/dL — ABNORMAL LOW (ref 8.4–10.5)
Chloride: 106 mEq/L (ref 96–112)
Creatinine, Ser: 2.83 mg/dL — ABNORMAL HIGH (ref 0.50–1.35)
GFR calc non Af Amer: 21 mL/min — ABNORMAL LOW (ref >90)
Glucose, Bld: 77 mg/dL (ref 70–99)
Sodium: 139 mEq/L (ref 135–145)

## 2013-09-10 LAB — PROTIME-INR: Prothrombin Time: 31.7 seconds — ABNORMAL HIGH (ref 11.6–15.2)

## 2013-09-10 LAB — CBC
HCT: 30.6 % — ABNORMAL LOW (ref 39.0–52.0)
MCH: 31.3 pg (ref 26.0–34.0)
MCV: 88.7 fL (ref 78.0–100.0)
Platelets: 250 10*3/uL (ref 150–400)
RBC: 3.45 MIL/uL — ABNORMAL LOW (ref 4.22–5.81)
WBC: 14.3 10*3/uL — ABNORMAL HIGH (ref 4.0–10.5)

## 2013-09-10 LAB — CREATININE, URINE, RANDOM: Creatinine, Urine: 98.86 mg/dL

## 2013-09-10 LAB — SODIUM, URINE, RANDOM: Sodium, Ur: 65 mEq/L

## 2013-09-10 LAB — STREP PNEUMONIAE URINARY ANTIGEN: Strep Pneumo Urinary Antigen: NEGATIVE

## 2013-09-10 MED ORDER — DM-GUAIFENESIN ER 30-600 MG PO TB12
1.0000 | ORAL_TABLET | Freq: Two times a day (BID) | ORAL | Status: DC
  Administered 2013-09-10 – 2013-09-12 (×5): 1 via ORAL
  Filled 2013-09-10 (×6): qty 1

## 2013-09-10 MED ORDER — WARFARIN - PHARMACIST DOSING INPATIENT: Freq: Every day | Status: DC

## 2013-09-10 MED ORDER — SODIUM CHLORIDE 0.9 % IV SOLN
INTRAVENOUS | Status: DC
  Administered 2013-09-10: 15:00:00 via INTRAVENOUS
  Administered 2013-09-11: 1 mL via INTRAVENOUS
  Administered 2013-09-12: 04:00:00 via INTRAVENOUS

## 2013-09-10 MED ORDER — SODIUM CHLORIDE 0.9 % IV BOLUS (SEPSIS)
1000.0000 mL | Freq: Once | INTRAVENOUS | Status: AC
  Administered 2013-09-10: 1000 mL via INTRAVENOUS

## 2013-09-10 MED ORDER — SODIUM CHLORIDE 0.9 % IV BOLUS (SEPSIS): 1000.0000 mL | Freq: Once | INTRAVENOUS | Status: DC

## 2013-09-10 NOTE — Progress Notes (Signed)
ANTICOAGULATION CONSULT NOTE - Initial Consult  Pharmacy Consult for Coumadin Indication: h/o pulmonary embolus and DVT  Allergies  Allergen Reactions  . Amlodipine Other (See Comments)    Muscle weakness  . Codeine     Patient Measurements: Height: 6\' 1"  (185.4 cm) Weight: 244 lb (110.678 kg) IBW/kg (Calculated) : 79.9 Heparin Dosing Weight: n/a  Vital Signs: Temp: 99.7 F (37.6 C) (11/24 1300) Temp src: Oral (11/24 0520) BP: 100/46 mmHg (11/24 1300) Pulse Rate: 93 (11/24 1300)  Labs:  Recent Labs  09/09/13 1136 09/10/13 0244  HGB 14.7 10.8*  HCT 40.1 30.6*  PLT 295 250  LABPROT 25.3* 31.7*  INR 2.39* 3.21*  CREATININE 2.42* 2.83*    Estimated Creatinine Clearance: 32.6 ml/min (by C-G formula based on Cr of 2.83).   Medical History: Past Medical History  Diagnosis Date  . Venous thrombosis     Recurrent  . Peripheral vascular disease   . Dyslipidemia   . AAA (abdominal aortic aneurysm)   . COPD (chronic obstructive pulmonary disease)   . Gout   . DJD (degenerative joint disease)   . Malignant hypertension     Medications:  Prescriptions prior to admission  Medication Sig Dispense Refill  . albuterol (PROVENTIL HFA;VENTOLIN HFA) 108 (90 BASE) MCG/ACT inhaler Inhale 1 puff into the lungs every 6 (six) hours as needed for wheezing or shortness of breath.      [name] amLODipine (NORVASC) 2.5 MG tablet Take 1 tablet (2.5 mg total) by mouth daily.  90 tablet  3  . benazepril (LOTENSIN) 40 MG tablet Take 1 tablet (40 mg total) by mouth daily.  90 tablet  3  . carvedilol (COREG) 12.5 MG tablet Take 1 tablet (12.5 mg total) by mouth 2 (two) times daily.  180 tablet  3  . cilostazol (PLETAL) 100 MG tablet Take 1 tablet (100 mg total) by mouth 2 (two) times daily.  180 tablet  3  . fenofibrate 160 MG tablet Take 1 tablet (160 mg total) by mouth daily.  90 tablet  3  . ibuprofen (ADVIL,MOTRIN) 800 MG tablet 1 po bid prn  60 tablet  3  . LEVITRA 20 MG tablet Take 20  mg by mouth daily as needed.       [name] losartan-hydrochlorothiazide (HYZAAR) 100-25 MG per tablet Take 1 tablet by mouth daily.  90 tablet  3  . nebivolol (BYSTOLIC) 10 MG tablet Take 20 mg by mouth daily.      . simvastatin (ZOCOR) 40 MG tablet Take 1 tablet (40 mg total) by mouth at bedtime.  90 tablet  3  . warfarin (COUMADIN) 2.5 MG tablet Take 2.5 mg by mouth daily.        Assessment: Tony Zhang is a 67 y.o. male history of AAA, hyperlipidemia, PE on Coumadin here presenting with vomiting and cough. Currently being treated with rocephin and azithromycin for CAP. INR at admission was 2.39 but has trended up significantly to 3.21 today. Likely due to interaction with antibiotics. Hgb also dropped from 14.7 to 10.8 today. Patient reports blood in the urine which he says is unusual. He continues to cough up blood tinged sputum as well.   Goal of Therapy:  INR 2-3 Monitor platelets by anticoagulation protocol: Yes   Plan:  1) Hold Coumadin today  2) F/u CBC and daily INR  3) Monitor for s/s of bleeding.   Vinnie Level, PharmD.  Clinical Pharmacist Pager [phone]

## 2013-09-10 NOTE — Care Management Utilization Note (Signed)
Utilization review completed. Paizleigh Wilds, RN BSN 

## 2013-09-10 NOTE — Progress Notes (Addendum)
Subjective: Tony Zhang was seen and examined this morning.  He says he feels better and there is half eaten breakfast tray at bedside.  He still has some right sided rib pain with cough.  Objective: Vital signs in last 24 hours: Filed Vitals:   09/09/13 1714 09/09/13 1950 09/10/13 0520 09/10/13 0622  BP: 102/46 92/43 85/60  90/48  Pulse: 84 77 86   Temp: 98 F (36.7 C) 98.5 F (36.9 C) 98.4 F (36.9 C)   TempSrc: Oral Oral Oral   Resp: 18 18 20    Height:      Weight:      SpO2: 97% 98% 94%    Weight change:   Intake/Output Summary (Last 24 hours) at 09/10/13 1303 Last data filed at 09/10/13 1100  Gross per 24 hour  Intake    240 ml  Output    300 ml  Net    -60 ml   General: resting in bed in NAD Cardiac: RRR, no rubs, murmurs or gallops Pulm: clear to auscultation bilaterally, moving normal volumes of air Abd: soft, nontender, nondistended, BS present Ext: warm and well perfused, no pedal edema Neuro: alert and oriented X3  Lab Results: Basic Metabolic Panel:  Recent Labs Lab 09/09/13 1136 09/10/13 0244  NA 138 139  K 3.4* 3.5  CL 103 106  CO2 20 21  GLUCOSE 163* 77  BUN 25* 35*  CREATININE 2.42* 2.83*  CALCIUM 9.1 7.6*   Liver Function Tests:  Recent Labs Lab 09/09/13 1136  AST 23  ALT 13  ALKPHOS 35*  BILITOT 0.8  PROT 6.9  ALBUMIN 3.6    Recent Labs Lab 09/09/13 1136  LIPASE 37   CBC:  Recent Labs Lab 09/09/13 1136 09/10/13 0244  WBC 22.0* 14.3*  NEUTROABS 20.2*  --   HGB 14.7 10.8*  HCT 40.1 30.6*  MCV 86.6 88.7  PLT 295 250   Coagulation:  Recent Labs Lab 09/05/13 09/05/13 1119 09/09/13 1136 09/10/13 0244  LABPROT  --   --  25.3* 31.7*  INR 2.8 2.80 2.39* 3.21*   Urine Drug Screen: Drugs of Abuse     Component Value Date/Time   LABOPIA POSITIVE* 02/20/2010 1123   COCAINSCRNUR NONE DETECTED 02/20/2010 1123   LABBENZ NONE DETECTED 02/20/2010 1123   AMPHETMU NONE DETECTED 02/20/2010 1123   THCU NONE DETECTED 02/20/2010  1123   LABBARB  Value: NONE DETECTED        DRUG SCREEN FOR MEDICAL PURPOSES ONLY.  IF CONFIRMATION IS NEEDED FOR ANY PURPOSE, NOTIFY LAB WITHIN 5 DAYS.        LOWEST DETECTABLE LIMITS FOR URINE DRUG SCREEN Drug Class       Cutoff (ng/mL) Amphetamine      1000 Barbiturate      200 Benzodiazepine   200 Tricyclics       300 Opiates          300 Cocaine          300 THC              50 02/20/2010 1123    Micro Results: Recent Results (from the past 240 hour(s))  CULTURE, BLOOD (ROUTINE X 2)     Status: None   Collection Time    09/09/13 12:45 PM      Result Value Range Status   Specimen Description BLOOD LEFT ARM   Final   Special Requests BOTTLES DRAWN AEROBIC AND ANAEROBIC 10CC   Final   Culture  Setup Time  Final   Value: 09/09/2013 17:02     Performed at [facility]   Culture     Final   Value:        BLOOD CULTURE RECEIVED NO GROWTH TO DATE CULTURE WILL BE HELD FOR 5 DAYS BEFORE ISSUING A FINAL NEGATIVE REPORT     Performed at [facility]   Report Status PENDING   Incomplete  CULTURE, BLOOD (ROUTINE X 2)     Status: None   Collection Time    09/09/13 12:45 PM      Result Value Range Status   Specimen Description BLOOD LEFT ARM   Final   Special Requests BOTTLES DRAWN AEROBIC AND ANAEROBIC 10CC   Final   Culture  Setup Time     Final   Value: 09/09/2013 17:01     Performed at [facility]   Culture     Final   Value:        BLOOD CULTURE RECEIVED NO GROWTH TO DATE CULTURE WILL BE HELD FOR 5 DAYS BEFORE ISSUING A FINAL NEGATIVE REPORT     Performed at [facility]   Report Status PENDING   Incomplete   Studies/Results: Dg Chest 2 View  09/09/2013   CLINICAL DATA:  Chest pain, cough  EXAM: CHEST  2 VIEW  COMPARISON:  02/20/2010  FINDINGS: An ill-defined infiltrate projects within the right middle lobe and right lower lobe. This finding partially silhouettes the heart border. Cardiac silhouette is otherwise within normal limits. Discoid atelectasis  projects in the periphery of the lingula. No further focal reason consolidation no focal infiltrate appreciated. Visualized osseous structures are unremarkable.  IMPRESSION: Right middle lobe and right lower lobe infiltrate surveillance evaluation recommended status post appropriate therapeutic management. If these findings persist further evaluation chest CT recommended.   Electronically Signed   By: Salome Holmes M.D.   On: 09/09/2013 12:45   Dg Cervical Spine Complete  09/09/2013   CLINICAL DATA:  History of and neck pain, no reported history of trauma  EXAM: CERVICAL SPINE  4+ VIEWS  COMPARISON:  None.  FINDINGS: There is no evidence of cervical spine fracture or prevertebral soft tissue swelling. Alignment is normal. No other significant bone abnormalities are identified.  IMPRESSION: Negative cervical spine radiographs.   Electronically Signed   By: Salome Holmes M.D.   On: 09/09/2013 12:48   Dg Abd 1 View  09/09/2013   CLINICAL DATA:  Abdominal pain  EXAM: ABDOMEN - 1 VIEW  COMPARISON:  Correlated with CT 11/23/2009  FINDINGS: There is a paucity of bowel gas. Air is seen within nondistended loops of large bowel. Moderate amount of stools appreciated. Visualized bony skeleton is unremarkable. Calcified densities project within the left upper quadrant appears to be above the level of the left renal fossa repeat consistent with splenic artery calcifications.  IMPRESSION: Nonobstructive bowel gas pattern with moderate amount of stool.   Electronically Signed   By: Salome Holmes M.D.   On: 09/09/2013 12:47   Medications: I have reviewed the patient's current medications. Scheduled Meds: . azithromycin  500 mg Oral Daily  . cefTRIAXone (ROCEPHIN)  IV  1 g Intravenous Q24H  . cilostazol  100 mg Oral BID  . dextromethorphan-guaiFENesin  1 tablet Oral BID  . fenofibrate  160 mg Oral Daily  . simvastatin  40 mg Oral QHS  . Warfarin - Physician Dosing Inpatient   Does not apply q1800   Continuous  Infusions:  PRN Meds:.albuterol, morphine  injection  Assessment/Plan: 67 year old male with a PMH of COPD, CKD 3, HTN, PVD and DVT (on coumadin and follows with Dr. Truett Perna) presenting with cough and pleuritic CP concerning for CAP.   #CAP  Cough, SOB and pleuritic right-sided CP. Differential includes pneumonia, PE, ACS. Wells score 2.5 (moderate risk), however PE unlikely given patient on coumadin with an INR of 2.39 in the ED. ACS less likely given right sided pleuritic chest pain, tenderness to palpation, negative troponin and no ischemic changes on EKG. Leukocytosis, CXR findings and clinical picture concerning for pneumonia.  Patient symptoms improving on antibiotics. - continue azithromycin and ceftriaxone for CAP  - Mucinex, morphine - urine strep and legionella  - sputum culture and gram stain  - blood cx pending - supplemental oxygen, keep sats > 92%   #COPD - no evidence of acute exacerbation, continue albuterol   #AKI on CKD3 - baseline Cr 1.3-1.4, Cr at admission 2.42 and trending up, 2.83 today.  May be due to vomiting but has not improved with fluid boluses.   - check urine microscopy, FeNA - renal US - hold benazepril  - encourage po - 50mL/hr NSS   #anemia - 14.7 --> 10.8 since admission.  Possibly dilutional.  Besides some blood in sputum, he denies significant blood loss, bloody stools or melena.  Coumadin on hold due to supratherapeutic INR today. - will check UA and FOBT - monitor CBC - evaluate need for colonoscopy  #Hypertension - normotensive at present; will hold antihypertensives   #Hx of DVT - continue coumadin per pharmacy; holding tonight's dose as INR is 3.21 today.  #PVD - continue cilostazol   #Diet - renal   #DVT ppx - coumadin  Dispo: Disposition is deferred at this time, awaiting improvement of current medical problems.  Anticipated discharge in approximately 1-2 day(s).   The patient does have a current PCP [name], MD) and does need  an [facility] hospital follow-up appointment after discharge.  The patient does not know have transportation limitations that hinder transportation to clinic appointments.  .Services Needed at time of discharge: Y = Yes, Blank = No PT:   OT:   RN:   Equipment:   Other:     LOS: 1 day   Evelena Peat, DO 09/10/2013, 1:03 PM

## 2013-09-10 NOTE — H&P (Signed)
  Date: 09/10/2013  Patient name: Tony Zhang  Medical record number: [id number]  Date of birth: 11-20-1945   I have seen and evaluated [name] and discussed their care with the Residency Team. Mr. Huezo is a 67yo man who presented with cough and right sided pleuritic chest pain which started the night prior to admission.  His cough was persistent and he vomited twice and developed some hemoptysis.  Associated symptoms included SOB.  He has been compliant with coumadin.  He was noted to have an elevated WBC and worsening renal function in the setting of CKD on lab review.  CXR showed likely RML, RLL consolidation concerning for pna.  He has a history of COPD, but has not been acutely worse recently.   Assessment and Plan: I have seen and evaluated the patient as outlined above. I agree with the formulated Assessment and Plan as detailed in the residents' admission note, with the following changes:   1. CAP - Azithro and Rocephin are reasonable choices - Urine studies and cultures sent - Supplemental O2 as needed - Cough suppression if needed - Pain control for pleuritic chest pain if not already ordered  2. AKI on CKD - Possibly due to vomiting, decreased intake, however, he has only been symptomatic for 1 day - Would check urine studies - Has received IV boluses of fluids, start maintenance fluids - Encourage PO intake as nausea resolves  - Hold benazepril   3. Acute anemia - H/H have dropped with hydration, possibly dilutional - Will follow and assess for any signs of bleeding.  If stabilizes, may be dilutional - If continues to drop, will need to consider holding warfarin therapy if possible - Evaluate for routine cancer screening (has he had a colonoscopy?)  Other issues per resident team.   Inez Catalina, MD 11/24/201411:03 AM

## 2013-09-11 ENCOUNTER — Encounter (HOSPITAL_COMMUNITY): Payer: Self-pay | Admitting: General Practice

## 2013-09-11 LAB — PROTIME-INR
INR: 2.33 — ABNORMAL HIGH (ref 0.00–1.49)
Prothrombin Time: 24.8 seconds — ABNORMAL HIGH (ref 11.6–15.2)

## 2013-09-11 LAB — CBC WITH DIFFERENTIAL/PLATELET
Basophils Absolute: 0 10*3/uL (ref 0.0–0.1)
Basophils Relative: 0 % (ref 0–1)
Eosinophils Absolute: 0.2 10*3/uL (ref 0.0–0.7)
Eosinophils Relative: 2 % (ref 0–5)
Hemoglobin: 10.4 g/dL — ABNORMAL LOW (ref 13.0–17.0)
MCH: 31.7 pg (ref 26.0–34.0)
MCHC: 35.7 g/dL (ref 30.0–36.0)
Monocytes Relative: 7 % (ref 3–12)
Neutro Abs: 8.3 10*3/uL — ABNORMAL HIGH (ref 1.7–7.7)
Neutrophils Relative %: 84 % — ABNORMAL HIGH (ref 43–77)
Platelets: 227 10*3/uL (ref 150–400)

## 2013-09-11 LAB — LEGIONELLA ANTIGEN, URINE: Legionella Antigen, Urine: NEGATIVE

## 2013-09-11 LAB — BASIC METABOLIC PANEL
BUN: 38 mg/dL — ABNORMAL HIGH (ref 6–23)
Calcium: 7.7 mg/dL — ABNORMAL LOW (ref 8.4–10.5)
Chloride: 106 mEq/L (ref 96–112)
Creatinine, Ser: 2.35 mg/dL — ABNORMAL HIGH (ref 0.50–1.35)
GFR calc Af Amer: 31 mL/min — ABNORMAL LOW (ref >90)
GFR calc non Af Amer: 27 mL/min — ABNORMAL LOW (ref >90)
Potassium: 3.3 mEq/L — ABNORMAL LOW (ref 3.5–5.1)
Sodium: 138 mEq/L (ref 135–145)

## 2013-09-11 MED ORDER — WARFARIN SODIUM 1 MG PO TABS
1.0000 mg | ORAL_TABLET | Freq: Once | ORAL | Status: AC
  Administered 2013-09-11: 1 mg via ORAL
  Filled 2013-09-11: qty 1

## 2013-09-11 MED ORDER — PNEUMOCOCCAL VAC POLYVALENT 25 MCG/0.5ML IJ INJ
0.5000 mL | INJECTION | INTRAMUSCULAR | Status: AC
  Administered 2013-09-12: 0.5 mL via INTRAMUSCULAR
  Filled 2013-09-11: qty 0.5

## 2013-09-11 MED ORDER — AMOXICILLIN 500 MG PO CAPS
500.0000 mg | ORAL_CAPSULE | Freq: Three times a day (TID) | ORAL | Status: DC
  Administered 2013-09-11: 500 mg via ORAL
  Filled 2013-09-11 (×3): qty 1

## 2013-09-11 MED ORDER — INFLUENZA VAC SPLIT QUAD 0.5 ML IM SUSP
0.5000 mL | INTRAMUSCULAR | Status: AC
  Administered 2013-09-12: 0.5 mL via INTRAMUSCULAR
  Filled 2013-09-11: qty 0.5

## 2013-09-11 MED ORDER — POTASSIUM CHLORIDE CRYS ER 20 MEQ PO TBCR
40.0000 meq | EXTENDED_RELEASE_TABLET | Freq: Once | ORAL | Status: AC
  Administered 2013-09-11: 40 meq via ORAL
  Filled 2013-09-11: qty 2

## 2013-09-11 MED ORDER — AMOXICILLIN 500 MG PO CAPS
1000.0000 mg | ORAL_CAPSULE | Freq: Three times a day (TID) | ORAL | Status: DC
Start: 2013-09-11 — End: 2013-09-12
  Administered 2013-09-11 – 2013-09-12 (×3): 1000 mg via ORAL
  Filled 2013-09-11 (×5): qty 2

## 2013-09-11 NOTE — Progress Notes (Signed)
  Date: 09/11/2013  Patient name: Tony Zhang  Medical record number: [id number]  Date of birth: 1946-02-05   This patient has been seen and the plan of care was discussed with the house staff. Please see their note for complete details. I concur with their findings with the following additions/corrections:  Improving today.  Will transition to PO Abx.  H/H have stabilized, possibly dilutional or loss of blood from hemoptysis and supratherapeutic INR which has now resolved.  He has not had appropriate cancer screening and this can be set up outpatient if H/H remain stable.   Inez Catalina, MD 09/11/2013, 12:24 PM

## 2013-09-11 NOTE — Progress Notes (Signed)
Subjective: Tony Zhang was seen and examined this morning.  He feels better, is sitting up on the side of bed and there is an empty breakfast tray at bedside.  His cough has improved, though he still has some mucous production.  He denies SOB on 2L and right sided rib pain have improved.   Objective: Vital signs in last 24 hours: Filed Vitals:   09/10/13 0622 09/10/13 1300 09/10/13 2001 09/11/13 0533  BP: 90/48 100/46 99/44 114/46  Pulse:  93 92 85  Temp:  99.7 F (37.6 C) 99 F (37.2 C) 98.6 F (37 C)  TempSrc:   Oral Oral  Resp:  18 18 18   Height:      Weight:      SpO2:  97% 97% 98%   Weight change:   Intake/Output Summary (Last 24 hours) at 09/11/13 1118 Last data filed at 09/11/13 1000  Gross per 24 hour  Intake   2440 ml  Output   1550 ml  Net    890 ml   General: sitting up in bed in NAD Cardiac: RRR, no rubs, murmurs or gallops Pulm: decreased BS at bases, no w/r/r, decreased cough Abd: soft, nontender, nondistended, BS present Ext: warm and well perfused, no pedal edema Neuro: alert and oriented X3  Lab Results: Basic Metabolic Panel:  Recent Labs Lab 09/10/13 0244 09/11/13 0640  NA 139 138  K 3.5 3.3*  CL 106 106  CO2 21 19  GLUCOSE 77 83  BUN 35* 38*  CREATININE 2.83* 2.35*  CALCIUM 7.6* 7.7*   Liver Function Tests:  Recent Labs Lab 09/09/13 1136  AST 23  ALT 13  ALKPHOS 35*  BILITOT 0.8  PROT 6.9  ALBUMIN 3.6    Recent Labs Lab 09/09/13 1136  LIPASE 37   CBC:  Recent Labs Lab 09/09/13 1136 09/10/13 0244 09/11/13 0640  WBC 22.0* 14.3* 9.9  NEUTROABS 20.2*  --  8.3*  HGB 14.7 10.8* 10.4*  HCT 40.1 30.6* 29.1*  MCV 86.6 88.7 88.7  PLT 295 250 227   Coagulation:  Recent Labs Lab 09/05/13 1119 09/09/13 1136 09/10/13 0244 09/11/13 0640  LABPROT  --  25.3* 31.7* 24.8*  INR 2.80 2.39* 3.21* 2.33*   Urine Drug Screen: Drugs of Abuse     Component Value Date/Time   LABOPIA POSITIVE* 02/20/2010 1123   COCAINSCRNUR  NONE DETECTED 02/20/2010 1123   LABBENZ NONE DETECTED 02/20/2010 1123   AMPHETMU NONE DETECTED 02/20/2010 1123   THCU NONE DETECTED 02/20/2010 1123   LABBARB  Value: NONE DETECTED        DRUG SCREEN FOR MEDICAL PURPOSES ONLY.  IF CONFIRMATION IS NEEDED FOR ANY PURPOSE, NOTIFY LAB WITHIN 5 DAYS.        LOWEST DETECTABLE LIMITS FOR URINE DRUG SCREEN Drug Class       Cutoff (ng/mL) Amphetamine      1000 Barbiturate      200 Benzodiazepine   200 Tricyclics       300 Opiates          300 Cocaine          300 THC              50 02/20/2010 1123    Micro Results: Recent Results (from the past 240 hour(s))  CULTURE, BLOOD (ROUTINE X 2)     Status: None   Collection Time    09/09/13 12:45 PM      Result Value Range Status   Specimen  Description BLOOD LEFT ARM   Final   Special Requests BOTTLES DRAWN AEROBIC AND ANAEROBIC 10CC   Final   Culture  Setup Time     Final   Value: 09/09/2013 17:02     Performed at [facility]   Culture     Final   Value:        BLOOD CULTURE RECEIVED NO GROWTH TO DATE CULTURE WILL BE HELD FOR 5 DAYS BEFORE ISSUING A FINAL NEGATIVE REPORT     Performed at [facility]   Report Status PENDING   Incomplete  CULTURE, BLOOD (ROUTINE X 2)     Status: None   Collection Time    09/09/13 12:45 PM      Result Value Range Status   Specimen Description BLOOD LEFT ARM   Final   Special Requests BOTTLES DRAWN AEROBIC AND ANAEROBIC 10CC   Final   Culture  Setup Time     Final   Value: 09/09/2013 17:01     Performed at [facility]   Culture     Final   Value:        BLOOD CULTURE RECEIVED NO GROWTH TO DATE CULTURE WILL BE HELD FOR 5 DAYS BEFORE ISSUING A FINAL NEGATIVE REPORT     Performed at [facility]   Report Status PENDING   Incomplete   Studies/Results: Dg Chest 2 View  09/09/2013   CLINICAL DATA:  Chest pain, cough  EXAM: CHEST  2 VIEW  COMPARISON:  02/20/2010  FINDINGS: An ill-defined infiltrate projects within the right middle lobe and right  lower lobe. This finding partially silhouettes the heart border. Cardiac silhouette is otherwise within normal limits. Discoid atelectasis projects in the periphery of the lingula. No further focal reason consolidation no focal infiltrate appreciated. Visualized osseous structures are unremarkable.  IMPRESSION: Right middle lobe and right lower lobe infiltrate surveillance evaluation recommended status post appropriate therapeutic management. If these findings persist further evaluation chest CT recommended.   Electronically Signed   By: Salome Holmes M.D.   On: 09/09/2013 12:45   Dg Cervical Spine Complete  09/09/2013   CLINICAL DATA:  History of and neck pain, no reported history of trauma  EXAM: CERVICAL SPINE  4+ VIEWS  COMPARISON:  None.  FINDINGS: There is no evidence of cervical spine fracture or prevertebral soft tissue swelling. Alignment is normal. No other significant bone abnormalities are identified.  IMPRESSION: Negative cervical spine radiographs.   Electronically Signed   By: Salome Holmes M.D.   On: 09/09/2013 12:48   Dg Abd 1 View  09/09/2013   CLINICAL DATA:  Abdominal pain  EXAM: ABDOMEN - 1 VIEW  COMPARISON:  Correlated with CT 11/23/2009  FINDINGS: There is a paucity of bowel gas. Air is seen within nondistended loops of large bowel. Moderate amount of stools appreciated. Visualized bony skeleton is unremarkable. Calcified densities project within the left upper quadrant appears to be above the level of the left renal fossa repeat consistent with splenic artery calcifications.  IMPRESSION: Nonobstructive bowel gas pattern with moderate amount of stool.   Electronically Signed   By: Salome Holmes M.D.   On: 09/09/2013 12:47   US Renal  09/10/2013   CLINICAL DATA:  Acute kidney injury.  EXAM: RENAL/URINARY TRACT ULTRASOUND COMPLETE  COMPARISON:  CT 11/23/2009.  FINDINGS: Right Kidney  Length: 12.7 cm. Normal renal cortical thickness and echogenicity. No hydronephrosis. There is a  hypoechoic lesion within the inferior pole of the right  kidney measuring up to 2.2 cm, difficult to visualize on this examination.  Left Kidney  Length: Measures 11.7 cm. Normal renal cortical thickness and echogenicity. No hydronephrosis. There is a 1.0 cm partially exophytic hypoechoic lesion off lower pole of the left kidney.  Bladder  Mild bladder wall thickening.  Spleen is enlarged measuring up to 15 cm.  IMPRESSION: 1. No hydronephrosis. 2. Probable cyst within the inferior pole of the right kidney which is not well visualized on current examination. Additionally there is a 1.0 cm incompletely characterized hypoechoic lesion within left kidney. Consider further evaluation with cross-sectional imaging, either CT or MRI. 3. Mild bladder wall thickening. Recommend correlation with urinalysis to exclude underlying cystitis. 4. Splenomegaly. These results will be called to the ordering clinician or representative by the Radiologist Assistant, and communication documented in the PACS Dashboard.   Electronically Signed   By: Annia Belt M.D.   On: 09/10/2013 17:07   Medications: I have reviewed the patient's current medications. Scheduled Meds: . azithromycin  500 mg Oral Daily  . cefTRIAXone (ROCEPHIN)  IV  1 g Intravenous Q24H  . cilostazol  100 mg Oral BID  . dextromethorphan-guaiFENesin  1 tablet Oral BID  . fenofibrate  160 mg Oral Daily  . [START ON 09/12/2013] influenza vac split quadrivalent PF  0.5 mL Intramuscular Tomorrow-1000  . [START ON 09/12/2013] pneumococcal 23 valent vaccine  0.5 mL Intramuscular Tomorrow-1000  . simvastatin  40 mg Oral QHS  . Warfarin - Pharmacist Dosing Inpatient   Does not apply q1800   Continuous Infusions: . sodium chloride 1 mL (09/11/13 1011)   PRN Meds:.albuterol, morphine injection  Assessment/Plan: 67 year old male with a PMH of COPD, CKD 3, HTN, PVD and DVT (on coumadin and follows with Dr. Truett Perna) presenting with cough and pleuritic CP concerning  for CAP.   #CAP  Cough, SOB and pleuritic right-sided CP. Differential includes pneumonia, PE, ACS. Wells score 2.5 (moderate risk), however PE unlikely given patient on coumadin with an INR of 2.39 in the ED. ACS less likely given right sided pleuritic chest pain, tenderness to palpation, negative troponin and no ischemic changes on EKG. Leukocytosis, CXR findings and clinical picture concerning for pneumonia.  Patient symptoms improving on antibiotics.  Urine strep pneumonia negative.  Blood cultures pending, no growth to date.  Urine legionella sputum culture and gram stain pending. - transition IV --> po antibiotic (stop ceftriaxone and switch to amoxicillin)  - continue azithromycin - continue Mucinex, morphine - supplemental oxygen, keep sats > 92%  - PT consult  #COPD - no evidence of acute exacerbation, continue albuterol   #AKI on CKD3 - baseline Cr 1.3-1.4, Cr at admission 2.42 and trending up to 2.83 but improved to 2.35 today.  FeNa 1.1% and urine Na 65 which could indicate intrinsic renal pathology however, urinalysis normal and creatinine improving with fluid.  Likely pre-renal 2/2 to vomiting since he is improving with gentle hydration and increasing po.  Patient urinating without problem and no evidence of obstruction. - hold benazepril  - encourage po - 36mL/hr NSS  - urine microscopy if he   #anemia - 14.7 --> 10.8--> 10.4 since admission.  Possibly dilutional given fluid boluses but quite a big drop.  Coumadin was supratherapeutic yesterday, held last night and now therapeutic today.  Besides some blood in sputum, he denies significant blood loss, bloody stools or melena.  UA negative for Hgb.  Awaiting FOBT.  - monitor CBC; now stable and no signs of  significant active bleeding; if continues to drop will need further work-up - will need outpatient colonoscopy   #Hypertension - normotensive at present; will hold antihypertensives   #Hx of DVT - continue coumadin per  pharmacy; INR 2.33  #PVD - continue cilostazol   #Diet - renal   #DVT ppx - coumadin  Dispo: Disposition is deferred at this time, awaiting improvement of current medical problems.  Anticipated discharge in approximately 1-2 day(s).   The patient does have a current PCP [name], MD) and does need an [facility] hospital follow-up appointment after discharge.  The patient does not know have transportation limitations that hinder transportation to clinic appointments.  .Services Needed at time of discharge: Y = Yes, Blank = No PT:   OT:   RN:   Equipment:   Other:     LOS: 2 days   Evelena Peat, DO 09/11/2013, 11:18 AM

## 2013-09-11 NOTE — Progress Notes (Signed)
ANTICOAGULATION CONSULT NOTE - Initial Consult  Pharmacy Consult for Coumadin Indication: h/o pulmonary embolus and DVT  Allergies  Allergen Reactions  . Amlodipine Other (See Comments)    Muscle weakness  . Codeine     Patient Measurements: Height: 6\' 1"  (185.4 cm) Weight: 244 lb (110.678 kg) IBW/kg (Calculated) : 79.9 Heparin Dosing Weight: n/a  Vital Signs: Temp: 98.6 F (37 C) (11/25 0533) Temp src: Oral (11/25 0533) BP: 114/46 mmHg (11/25 0533) Pulse Rate: 85 (11/25 0533)  Labs:  Recent Labs  09/09/13 1136 09/10/13 0244 09/11/13 0640  HGB 14.7 10.8* 10.4*  HCT 40.1 30.6* 29.1*  PLT 295 250 227  LABPROT 25.3* 31.7* 24.8*  INR 2.39* 3.21* 2.33*  CREATININE 2.42* 2.83* 2.35*    Estimated Creatinine Clearance: 39.2 ml/min (by C-G formula based on Cr of 2.35).   Medical History: Past Medical History  Diagnosis Date  . Venous thrombosis     Recurrent  . Peripheral vascular disease   . Dyslipidemia   . AAA (abdominal aortic aneurysm)   . COPD (chronic obstructive pulmonary disease)   . Gout   . DJD (degenerative joint disease)   . Malignant hypertension   . Pneumonia 08/2013  . Shortness of breath     Medications:  Prescriptions prior to admission  Medication Sig Dispense Refill  . albuterol (PROVENTIL HFA;VENTOLIN HFA) 108 (90 BASE) MCG/ACT inhaler Inhale 1 puff into the lungs every 6 (six) hours as needed for wheezing or shortness of breath.      [name] amLODipine (NORVASC) 2.5 MG tablet Take 1 tablet (2.5 mg total) by mouth daily.  90 tablet  3  . benazepril (LOTENSIN) 40 MG tablet Take 1 tablet (40 mg total) by mouth daily.  90 tablet  3  . carvedilol (COREG) 12.5 MG tablet Take 1 tablet (12.5 mg total) by mouth 2 (two) times daily.  180 tablet  3  . cilostazol (PLETAL) 100 MG tablet Take 1 tablet (100 mg total) by mouth 2 (two) times daily.  180 tablet  3  . fenofibrate 160 MG tablet Take 1 tablet (160 mg total) by mouth daily.  90 tablet  3  .  ibuprofen (ADVIL,MOTRIN) 800 MG tablet 1 po bid prn  60 tablet  3  . LEVITRA 20 MG tablet Take 20 mg by mouth daily as needed.       [name] losartan-hydrochlorothiazide (HYZAAR) 100-25 MG per tablet Take 1 tablet by mouth daily.  90 tablet  3  . nebivolol (BYSTOLIC) 10 MG tablet Take 20 mg by mouth daily.      . simvastatin (ZOCOR) 40 MG tablet Take 1 tablet (40 mg total) by mouth at bedtime.  90 tablet  3  . warfarin (COUMADIN) 2.5 MG tablet Take 2.5 mg by mouth daily.        Assessment: Tony Zhang is a 67 y.o. male history of AAA, hyperlipidemia, PE on Coumadin here presenting with vomiting and cough. Currently being treated with amoxicillin and azithromycin for CAP. INR is currently therapeutic at 2.33. Likely due to interaction with antibiotics. H/H low but stable at 10.4/29.1. Blood in urine seems to have resolved. No other unusual bleeding noted.    Goal of Therapy:  INR 2-3 Monitor platelets by anticoagulation protocol: Yes   Plan:  1) Coumadin 1 mg today   2) F/u CBC and daily INR  3) Monitor for s/s of bleeding.   Vinnie Level, PharmD.  Clinical Pharmacist Pager [phone]

## 2013-09-11 NOTE — Evaluation (Signed)
Physical Therapy Evaluation Patient Details Name: CULLIN DISHMAN MRN: [id number] DOB: 1946/09/05 Today's Date: 09/11/2013 Time: 8469-6295 PT Time Calculation (min): 28 min  PT Assessment / Plan / Recommendation History of Present Illness  Mr. Minder is a 67 year old male with a PMH of COPD, CKD 3, HTN, PVD and DVT (on coumadin and follows with Dr. Truett Perna).  He presents with complaint of cough and right sided pleuritic chest/rib pain.  Admitted with CAP.  Clinical Impression  Patient presents with significant weakness and activity intolerance limiting independence and safety with mobility.  He will benefit from skilled PT in the acute setting and would ideally like him to have at least intermittent supervision at home for help with IADL's in addition to HHPT. However, currently pt not interested in [facility] or getting family to assist with grocery trips etc.      PT Assessment  Patient needs continued PT services    Follow Up Recommendations  Home health PT;Other (comment)    Does the patient have the potential to tolerate intense rehabilitation    N/A  Barriers to Discharge Decreased caregiver support      Equipment Recommendations  Other (comment) (TBA; pt not interested in using walker, but feel would help if he agrees)    Recommendations for Other Services   OT  Frequency Min 3X/week    Precautions / Restrictions Precautions Precautions: Fall   Pertinent Vitals/Pain C/o chest pain with coughing (RN aware)      Mobility  Bed Mobility Bed Mobility: Supine to Sit Supine to Sit: 6: Modified independent (Device/Increase time);HOB elevated Transfers Transfers: Sit to Stand;Stand to Sit Sit to Stand: 5: Supervision;From bed Stand to Sit: 5: Supervision;To bed Details for Transfer Assistance: for safety due to c/o feels "wobbly" Ambulation/Gait Ambulation/Gait Assistance: 4: Min guard;5: Supervision Ambulation Distance (Feet): 80 Feet (x 2 with seated rest in hallway) Assistive  device: Other (Comment) (pushing IV pole or holding wall rail) Ambulation/Gait Assistance Details: slow and fatigues quickly with min c/o nausea.  Cough prod of bloody sputum during ambulation (RN aware) Gait Pattern: Step-through pattern;Shuffle;Decreased stride length Stairs: No        PT Diagnosis: Generalized weakness;Difficulty walking  PT Problem List: Decreased strength;Decreased activity tolerance;Decreased balance;Decreased mobility;Decreased safety awareness;Decreased knowledge of use of DME PT Treatment Interventions: DME instruction;Balance training;Gait training;Stair training;Patient/family education;Functional mobility training;Therapeutic activities;Therapeutic exercise     PT Goals(Current goals can be found in the care plan section) Acute Rehab PT Goals Patient Stated Goal: To get back home PT Goal Formulation: With patient Time For Goal Achievement: 09/18/13 Potential to Achieve Goals: Good  Visit Information  Last PT Received On: 09/11/13 Assistance Needed: +1 History of Present Illness: Mr. Lada is a 67 year old male with a PMH of COPD, CKD 3, HTN, PVD and DVT (on coumadin and follows with Dr. Truett Perna).  He presents with complaint of cough and right sided pleuritic chest/rib pain.  Admitted with CAP.       Prior Functioning  Home Living Family/patient expects to be discharged to:: Private residence Living Arrangements: Alone Type of Home: House Home Access: Stairs to enter Secretary/administrator of Steps: 4 Entrance Stairs-Rails: Left;Right Home Layout: One level Home Equipment: None Prior Function Level of Independence: Independent Comments: Just had cataract surgery on Friday and drove home from brother in law's house that day Communication Communication: No difficulties    Cognition  Cognition Arousal/Alertness: Awake/alert Behavior During Therapy: WFL for tasks assessed/performed Overall Cognitive Status: Within Functional Limits  for tasks  assessed    Extremity/Trunk Assessment Lower Extremity Assessment Lower Extremity Assessment: Overall WFL for tasks assessed   Balance Balance Balance Assessed: Yes Static Standing Balance Static Standing - Balance Support: No upper extremity supported Static Standing - Level of Assistance: 5: Stand by assistance Static Standing - Comment/# of Minutes: 1 minute  End of Session PT - End of Session Equipment Utilized During Treatment: Oxygen Activity Tolerance: Patient limited by fatigue Patient left: in bed;with call bell/phone within reach (sitting on edge of the bed) Nurse Communication: Patient requests pain meds  GP     [facility] 09/11/2013, 4:41 PM Sheran Lawless, PT [phone] 09/11/2013

## 2013-09-12 ENCOUNTER — Telehealth: Payer: Self-pay | Admitting: Pharmacist

## 2013-09-12 DIAGNOSIS — D649 Anemia, unspecified: Secondary | ICD-10-CM

## 2013-09-12 DIAGNOSIS — I714 Abdominal aortic aneurysm, without rupture: Secondary | ICD-10-CM

## 2013-09-12 LAB — BASIC METABOLIC PANEL
BUN: 25 mg/dL — ABNORMAL HIGH (ref 6–23)
CO2: 20 mEq/L (ref 19–32)
Calcium: 8.4 mg/dL (ref 8.4–10.5)
Chloride: 109 mEq/L (ref 96–112)
Creatinine, Ser: 1.68 mg/dL — ABNORMAL HIGH (ref 0.50–1.35)
GFR calc Af Amer: 47 mL/min — ABNORMAL LOW (ref >90)
GFR calc non Af Amer: 40 mL/min — ABNORMAL LOW (ref >90)
Glucose, Bld: 85 mg/dL (ref 70–99)
Potassium: 3.6 mEq/L (ref 3.5–5.1)
Sodium: 141 mEq/L (ref 135–145)

## 2013-09-12 LAB — CBC WITH DIFFERENTIAL/PLATELET
Basophils Absolute: 0 10*3/uL (ref 0.0–0.1)
Basophils Relative: 0 % (ref 0–1)
Eosinophils Absolute: 0.3 10*3/uL (ref 0.0–0.7)
Eosinophils Relative: 3 % (ref 0–5)
HCT: 30.1 % — ABNORMAL LOW (ref 39.0–52.0)
Hemoglobin: 10.6 g/dL — ABNORMAL LOW (ref 13.0–17.0)
Lymphocytes Relative: 10 % — ABNORMAL LOW (ref 12–46)
Lymphs Abs: 0.8 10*3/uL (ref 0.7–4.0)
MCH: 31.6 pg (ref 26.0–34.0)
MCHC: 35.2 g/dL (ref 30.0–36.0)
MCV: 89.9 fL (ref 78.0–100.0)
Monocytes Absolute: 0.6 10*3/uL (ref 0.1–1.0)
Monocytes Relative: 7 % (ref 3–12)
Neutro Abs: 6.7 10*3/uL (ref 1.7–7.7)
Neutrophils Relative %: 80 % — ABNORMAL HIGH (ref 43–77)
Platelets: 221 10*3/uL (ref 150–400)
RBC: 3.35 MIL/uL — ABNORMAL LOW (ref 4.22–5.81)
RDW: 15 % (ref 11.5–15.5)
WBC: 8.4 10*3/uL (ref 4.0–10.5)

## 2013-09-12 LAB — PROTIME-INR
INR: 1.84 — ABNORMAL HIGH (ref 0.00–1.49)
Prothrombin Time: 20.7 seconds — ABNORMAL HIGH (ref 11.6–15.2)

## 2013-09-12 MED ORDER — AMOXICILLIN 500 MG PO CAPS: 1000.0000 mg | ORAL_CAPSULE | Freq: Three times a day (TID) | ORAL | Status: DC

## 2013-09-12 NOTE — Progress Notes (Signed)
Patient provided with discharge instructions and follow up information. He was educated on the importance of NOT taking Carvedilol and Bystolic at the same time. Patient is going home with support from friends and family.

## 2013-09-12 NOTE — Progress Notes (Signed)
Subjective: Mr. Wegman was seen and examined this morning.  He feels better and his cough is improved.  He has not had a BM but says this is not abnormal and refuses laxative.     Objective: Vital signs in last 24 hours: Filed Vitals:   09/11/13 1400 09/11/13 1628 09/11/13 1958 09/11/13 2230  BP: 145/68  127/69   Pulse: 88 106 89   Temp: 98.8 F (37.1 C)  99.6 F (37.6 C) 99.1 F (37.3 C)  TempSrc:   Oral Oral  Resp: 18  18   Height:      Weight:      SpO2: 95% 93% 95%    Weight change:   Intake/Output Summary (Last 24 hours) at 09/12/13 1117 Last data filed at 09/12/13 1114  Gross per 24 hour  Intake 1571.67 ml  Output   2650 ml  Net -1078.33 ml   General: sitting up in bed in NAD Cardiac: RRR, no rubs, murmurs or gallops Pulm: CTA B/L, no w/r/r, decreased cough Abd: soft, nontender, nondistended, BS present Ext: warm and well perfused, no pedal edema Neuro: alert and oriented X3  Lab Results: Basic Metabolic Panel:  Recent Labs Lab 09/11/13 0640 09/12/13 0525  NA 138 141  K 3.3* 3.6  CL 106 109  CO2 19 20  GLUCOSE 83 85  BUN 38* 25*  CREATININE 2.35* 1.68*  CALCIUM 7.7* 8.4   Liver Function Tests:  Recent Labs Lab 09/09/13 1136  AST 23  ALT 13  ALKPHOS 35*  BILITOT 0.8  PROT 6.9  ALBUMIN 3.6    Recent Labs Lab 09/09/13 1136  LIPASE 37   CBC:  Recent Labs Lab 09/11/13 0640 09/12/13 0525  WBC 9.9 8.4  NEUTROABS 8.3* 6.7  HGB 10.4* 10.6*  HCT 29.1* 30.1*  MCV 88.7 89.9  PLT 227 221   Coagulation:  Recent Labs Lab 09/09/13 1136 09/10/13 0244 09/11/13 0640 09/12/13 0525  LABPROT 25.3* 31.7* 24.8* 20.7*  INR 2.39* 3.21* 2.33* 1.84*   Urine Drug Screen: Drugs of Abuse     Component Value Date/Time   LABOPIA POSITIVE* 02/20/2010 1123   COCAINSCRNUR NONE DETECTED 02/20/2010 1123   LABBENZ NONE DETECTED 02/20/2010 1123   AMPHETMU NONE DETECTED 02/20/2010 1123   THCU NONE DETECTED 02/20/2010 1123   LABBARB  Value: NONE DETECTED         DRUG SCREEN FOR MEDICAL PURPOSES ONLY.  IF CONFIRMATION IS NEEDED FOR ANY PURPOSE, NOTIFY LAB WITHIN 5 DAYS.        LOWEST DETECTABLE LIMITS FOR URINE DRUG SCREEN Drug Class       Cutoff (ng/mL) Amphetamine      1000 Barbiturate      200 Benzodiazepine   200 Tricyclics       300 Opiates          300 Cocaine          300 THC              50 02/20/2010 1123    Micro Results: Recent Results (from the past 240 hour(s))  CULTURE, BLOOD (ROUTINE X 2)     Status: None   Collection Time    09/09/13 12:45 PM      Result Value Range Status   Specimen Description BLOOD LEFT ARM   Final   Special Requests BOTTLES DRAWN AEROBIC AND ANAEROBIC 10CC   Final   Culture  Setup Time     Final   Value: 09/09/2013 17:02  Performed at [facility]     Final   Value:        BLOOD CULTURE RECEIVED NO GROWTH TO DATE CULTURE WILL BE HELD FOR 5 DAYS BEFORE ISSUING A FINAL NEGATIVE REPORT     Performed at [facility]   Report Status PENDING   Incomplete  CULTURE, BLOOD (ROUTINE X 2)     Status: None   Collection Time    09/09/13 12:45 PM      Result Value Range Status   Specimen Description BLOOD LEFT ARM   Final   Special Requests BOTTLES DRAWN AEROBIC AND ANAEROBIC 10CC   Final   Culture  Setup Time     Final   Value: 09/09/2013 17:01     Performed at [facility]   Culture     Final   Value:        BLOOD CULTURE RECEIVED NO GROWTH TO DATE CULTURE WILL BE HELD FOR 5 DAYS BEFORE ISSUING A FINAL NEGATIVE REPORT     Performed at [facility]   Report Status PENDING   Incomplete   Studies/Results: US Renal  09/10/2013   CLINICAL DATA:  Acute kidney injury.  EXAM: RENAL/URINARY TRACT ULTRASOUND COMPLETE  COMPARISON:  CT 11/23/2009.  FINDINGS: Right Kidney  Length: 12.7 cm. Normal renal cortical thickness and echogenicity. No hydronephrosis. There is a hypoechoic lesion within the inferior pole of the right kidney measuring up to 2.2 cm, difficult to visualize on this  examination.  Left Kidney  Length: Measures 11.7 cm. Normal renal cortical thickness and echogenicity. No hydronephrosis. There is a 1.0 cm partially exophytic hypoechoic lesion off lower pole of the left kidney.  Bladder  Mild bladder wall thickening.  Spleen is enlarged measuring up to 15 cm.  IMPRESSION: 1. No hydronephrosis. 2. Probable cyst within the inferior pole of the right kidney which is not well visualized on current examination. Additionally there is a 1.0 cm incompletely characterized hypoechoic lesion within left kidney. Consider further evaluation with cross-sectional imaging, either CT or MRI. 3. Mild bladder wall thickening. Recommend correlation with urinalysis to exclude underlying cystitis. 4. Splenomegaly. These results will be called to the ordering clinician or representative by the Radiologist Assistant, and communication documented in the PACS Dashboard.   Electronically Signed   By: Annia Belt M.D.   On: 09/10/2013 17:07   Medications: I have reviewed the patient's current medications. Scheduled Meds: . amoxicillin  1,000 mg Oral Q8H  . azithromycin  500 mg Oral Daily  . cilostazol  100 mg Oral BID  . dextromethorphan-guaiFENesin  1 tablet Oral BID  . fenofibrate  160 mg Oral Daily  . simvastatin  40 mg Oral QHS  . Warfarin - Pharmacist Dosing Inpatient   Does not apply q1800   Continuous Infusions: . sodium chloride 50 mL/hr at 09/12/13 0700   PRN Meds:.albuterol, morphine injection  Assessment/Plan: 67 year old male with a PMH of COPD, CKD 3, HTN, PVD and DVT (on coumadin and follows with Dr. Truett Perna) presenting with cough and pleuritic CP concerning for CAP.   #CAP  Cough, SOB and pleuritic right-sided CP. Differential includes pneumonia, PE, ACS. Wells score 2.5 (moderate risk), however PE unlikely given patient on coumadin with an INR of 2.39 in the ED. ACS less likely given right sided pleuritic chest pain, tenderness to palpation, negative troponin and no  ischemic changes on EKG. Leukocytosis, CXR findings and clinical picture concerning for pneumonia.  Patient symptoms  improving on antibiotics.  Urine strep pneumonia and legionella negative.  Blood cultures pending, no growth to date.  Sputum culture and gram stain pending.  Patient has received > 1.5g of Azithromycin and legionella negative so can d/c this antibiotic today.  PT recommends home health PT and walker but patient declines these services. - continue 1g amoxicillin TID x 3 more days   - check SpO2 with and without oxygen while ambulating to determine if home oxygen need - follow-up in clinic Dec 2; will need CXR in 6 weeks to monitor resolution of CAP  #COPD - no evidence of acute exacerbation, continue albuterol   #AKI on CKD3, resolving - baseline Cr 1.3-1.4, Cr at admission 2.42 and trending up to 2.83 but improved to 1.68.  FeNa 1.1% and urine Na 65 which could indicate intrinsic renal pathology however, urinalysis normal and creatinine improving with fluid.  Likely pre-renal 2/2 to vomiting since he is improving with gentle hydration and increasing po.  Patient urinating without problem and no evidence of obstruction. - hold benazepril  - encourage po - 44mL/hr NSS   #anemia, stable - 14.7 --> 10.8--> 10.4 -->10.6 since admission.  Possibly dilutional given fluid boluses but quite a big drop.  Coumadin was supratherapeutic on hospital day 2, subsequently held and now subtherapeutic.  He denies significant blood loss, bloody stools or melena.  UA negative for Hgb.  Awaiting FOBT.  - now stable and no signs of significant active bleeding - will need outpatient colonoscopy   #Hypertension - normotensive at present; will hold antihypertensives   #Hx of DVT - continue coumadin per pharmacy; INR 1.84.  Will d/c on home dose and schedule patient for INR check on Dec 1.  #PVD - continue cilostazol   #Diet - renal   #DVT ppx - coumadin  Dispo:  Mr. Brassfield symptoms have improved and  he is hemodynamically stable so will d/c home today.  INR check on Dec 1.  Hospital follow-up on Dec 2.  The patient does have a current PCP [name], MD) and does need an [facility] hospital follow-up appointment after discharge.  The patient does not know have transportation limitations that hinder transportation to clinic appointments.  .Services Needed at time of discharge: Y = Yes, Blank = No PT:   OT:   RN:   Equipment:   Other:     LOS: 3 days   Evelena Peat, DO 09/12/2013, 11:17 AM

## 2013-09-12 NOTE — Telephone Encounter (Signed)
Spoke with patient over phone to schedule CC appmt per request of d/cing MD at [facility] with his recent hospitalization.   He was placed on Amoxicillin and she request an INR on Monday, 09/17/13. His next scheduled cc appmt was 11/07/13. Please r/s this in dose response after appmt on 09/17/13 if pt OK to return to clinic on this day. The  11/07/13 appmt was NOT cancelled in Epic

## 2013-09-12 NOTE — Discharge Summary (Signed)
Patient Name:  Tony Zhang  MRN: [id number]  PCP: [name], MD  DOB:  July 03, 1946       Date of Admission:  09/09/2013  Date of Discharge:  09/12/2013      Attending Physician: Dr. Inez Catalina, MD         DISCHARGE DIAGNOSES: 1.   CAP (community acquired pneumonia) 2.   AAA (abdominal aortic aneurysm) 3.   Gout 4.   COPD (chronic obstructive pulmonary disease) 5.   CKD (chronic kidney disease) stage 3, GFR 30-59 ml/min   DISPOSITION AND FOLLOW-UP: Tony Zhang is to follow-up with the listed providers as detailed below, at patient's visiting, please address following issues:  1) INR check - patient has been on antibiotic therapy.  [facility]  The pharmacist there said she would review the chart and call the patient with the date and time for his INR check.  I requested Dec 1 for the check.  2) BMP check - patient presented with AKI on CKD; please check his creatinine  3) Patient was anemic in hospital.  Will need repeat CBC and referral for colonoscopy.  4) Patient will need a referral for CXR in 6 weeks to monitor resolution of pneumonia.  5) Patient's PCP is Dr. [name].  I was unable to make a follow-up appointment with his office.  Please be sure patient arranges follow-up with him.  Follow-up Information   Follow up with Otis Brace, MD. (09/18/2013 at 10AM)    Specialty:  Internal Medicine   Contact information:   [address] [place] [address] [phone]       Call Thurmon Fair, MD. (09/19/2013 at 2:30PM)    Specialty:  Cardiology   Contact information:   [address] [place] [address] [phone]      Discharge Orders   Future Appointments Provider Department Dept Phone   09/18/2013 10:00 AM Otis Brace, MD [facility] [phone]   09/19/2013 2:30 PM Brittainy Delmer [facility] [phone]   11/07/2013 11:30 AM Sherrie Mustache [facility] [phone]   11/07/2013 11:45 AM [facility] [phone]   07/15/2014 11:00 AM Ladene Artist, MD [facility] [phone]   Future Orders Complete By Expires   Call MD for:  difficulty breathing, headache or visual disturbances  As directed    Call MD for:  extreme fatigue  As directed    Call MD for:  persistant dizziness or light-headedness  As directed    Call MD for:  persistant nausea and vomiting  As directed    Call MD for:  severe uncontrolled pain  As directed    Call MD for:  temperature >100.4  As directed    Diet - low sodium heart healthy  As directed    Increase activity slowly  As directed        DISCHARGE MEDICATIONS:   Medication List         albuterol 108 (90 BASE) MCG/ACT inhaler  Commonly known as:  PROVENTIL HFA;VENTOLIN HFA  Inhale 1 puff into the lungs every 6 (six) hours as needed for wheezing or shortness of breath.     amLODipine 2.5 MG tablet  Commonly known as:  NORVASC  Take 1 tablet (2.5 mg total) by mouth daily.     amoxicillin 500 MG  capsule  Commonly known as:  AMOXIL  Take 2 capsules (1,000 mg total) by mouth every 8 (eight) hours.     benazepril 40 MG tablet  Commonly known as:  LOTENSIN  Take 1 tablet (40 mg total) by mouth daily.     carvedilol 12.5 MG tablet  Commonly known as:  COREG  Take 1 tablet (12.5 mg total) by mouth 2 (two) times daily.     cilostazol 100 MG tablet  Commonly known as:  PLETAL  Take 1 tablet (100 mg total) by mouth 2 (two) times daily.     fenofibrate 160 MG tablet  Take 1 tablet (160 mg total) by mouth daily.     ibuprofen 800 MG tablet  Commonly known as:  ADVIL,MOTRIN  1 po bid prn     LEVITRA 20 MG tablet  Generic drug:  vardenafil  Take 20 mg by mouth daily as needed.     losartan-hydrochlorothiazide 100-25 MG per tablet  Commonly  known as:  HYZAAR  Take 1 tablet by mouth daily.     nebivolol 10 MG tablet  Commonly known as:  BYSTOLIC  Take 20 mg by mouth daily.     simvastatin 40 MG tablet  Commonly known as:  ZOCOR  Take 1 tablet (40 mg total) by mouth at bedtime.     warfarin 2.5 MG tablet  Commonly known as:  COUMADIN  Take 2.5 mg by mouth daily.         CONSULTS:    None   PROCEDURES PERFORMED:  Dg Chest 2 View  09/09/2013   CLINICAL DATA:  Chest pain, cough  EXAM: CHEST  2 VIEW  COMPARISON:  02/20/2010  FINDINGS: An ill-defined infiltrate projects within the right middle lobe and right lower lobe. This finding partially silhouettes the heart border. Cardiac silhouette is otherwise within normal limits. Discoid atelectasis projects in the periphery of the lingula. No further focal reason consolidation no focal infiltrate appreciated. Visualized osseous structures are unremarkable.  IMPRESSION: Right middle lobe and right lower lobe infiltrate surveillance evaluation recommended status post appropriate therapeutic management. If these findings persist further evaluation chest CT recommended.   Electronically Signed   By: Salome Holmes M.D.   On: 09/09/2013 12:45   Dg Cervical Spine Complete  09/09/2013   CLINICAL DATA:  History of and neck pain, no reported history of trauma  EXAM: CERVICAL SPINE  4+ VIEWS  COMPARISON:  None.  FINDINGS: There is no evidence of cervical spine fracture or prevertebral soft tissue swelling. Alignment is normal. No other significant bone abnormalities are identified.  IMPRESSION: Negative cervical spine radiographs.   Electronically Signed   By: Salome Holmes M.D.   On: 09/09/2013 12:48   Dg Abd 1 View  09/09/2013   CLINICAL DATA:  Abdominal pain  EXAM: ABDOMEN - 1 VIEW  COMPARISON:  Correlated with CT 11/23/2009  FINDINGS: There is a paucity of bowel gas. Air is seen within nondistended loops of large bowel. Moderate amount of stools appreciated. Visualized bony skeleton  is unremarkable. Calcified densities project within the left upper quadrant appears to be above the level of the left renal fossa repeat consistent with splenic artery calcifications.  IMPRESSION: Nonobstructive bowel gas pattern with moderate amount of stool.   Electronically Signed   By: Salome Holmes M.D.   On: 09/09/2013 12:47   US Renal  09/10/2013   CLINICAL DATA:  Acute kidney injury.  EXAM: RENAL/URINARY TRACT ULTRASOUND COMPLETE  COMPARISON:  CT 11/23/2009.  FINDINGS: Right Kidney  Length: 12.7 cm. Normal renal cortical thickness and echogenicity. No hydronephrosis. There is a hypoechoic lesion within the inferior pole of the right kidney measuring up to 2.2 cm, difficult to visualize on this examination.  Left Kidney  Length: Measures 11.7 cm. Normal renal cortical thickness and echogenicity. No hydronephrosis. There is a 1.0 cm partially exophytic hypoechoic lesion off lower pole of the left kidney.  Bladder  Mild bladder wall thickening.  Spleen is enlarged measuring up to 15 cm.  IMPRESSION: 1. No hydronephrosis. 2. Probable cyst within the inferior pole of the right kidney which is not well visualized on current examination. Additionally there is a 1.0 cm incompletely characterized hypoechoic lesion within left kidney. Consider further evaluation with cross-sectional imaging, either CT or MRI. 3. Mild bladder wall thickening. Recommend correlation with urinalysis to exclude underlying cystitis. 4. Splenomegaly. These results will be called to the ordering clinician or representative by the Radiologist Assistant, and communication documented in the PACS Dashboard.   Electronically Signed   By: Annia Belt M.D.   On: 09/10/2013 17:07       ADMISSION DATA: H&P: [name] is a 67 year old male with a PMH of COPD, CKD 3, HTN, PVD and DVT (on coumadin and follows with Dr. Truett Perna). He presents with complaint of cough and right sided pleuritic chest/rib pain that started last night. He says he ate  a fish sandwich yesterday and later that night he experienced acid reflux. He then vomited twice and began to cough up blood. He reports some SOB and says his cough continued all night. He denies recent travel or sick contacts, lower extremity pain or swelling. He reports compliance with his coumadin and reports his last INR was 2.8, measured four days ago.  In the ED: 97.69F, BP 113/60, HR 105, RR 20, SpO2 98%; he received 2L NSS bolus, 4mg  Zofran, Zosyn, 6mg  morphine.  Physical Exam: Blood pressure 97/47, pulse 88, temperature 97.7 F (36.5 C), temperature source Oral, resp. rate 19, height 6\' 1"  (1.854 m), weight 110.678 kg (244 lb), SpO2 95.00%.  General: resting in bed in NAD  HEENT: PERRL, EOMI, no scleral icterus, +cough  Cardiac: RRR, no rubs, murmurs or gallops, +right sided ribs TTP  Pulm: clear to auscultation bilaterally, moving normal volumes of air  Abd: soft, nontender, nondistended, BS present  Ext: warm and well perfused, no pedal edema  Neuro: alert and oriented X3, cranial nerves II-XII grossly intact  Labs: Basic Metabolic Panel:   Recent Labs   09/09/13 1136   NA  138   K  3.4*   CL  103   CO2  20   GLUCOSE  163*   BUN  25*   CREATININE  2.42*   CALCIUM  9.1    Liver Function Tests:   Recent Labs   09/09/13 1136   AST  23   ALT  13   ALKPHOS  35*   BILITOT  0.8   PROT  6.9   ALBUMIN  3.6     Recent Labs   09/09/13 1136   LIPASE  37    CBC:   Recent Labs   09/09/13 1136   WBC  22.0*   NEUTROABS  20.2*   HGB  14.7   HCT  40.1   MCV  86.6   PLT  295    Coagulation:   Recent Labs   09/09/13 1136   LABPROT  25.3*   INR  2.39*  HOSPITAL COURSE: CAP  [name] presented with cough, SOB and pleuritic right-sided CP.  He had a Wells score of 2.5 (moderate risk), however PE was unlikely since he had a therapeutic INR on coumadin at admission.  ACS was less likely given right sided pleuritic chest pain, tenderness to palpation,  negative troponin and no ischemic changes on EKG.  Leukocytosis, CXR findings and clinical picture were suggestive of pneumonia and his symptoms improved with antibiotic treatment (azithromycin and ceftriaxone initially).  Urine strep pneumonia and legionella were negative.  Ceftriaxone was switched to amoxicillin once he improved clinically.  Blood cultures pending, no growth to date on day of discharge.  PT evaluated [name] in hospital and recommended home health PT and a walker but he declined these services on more than one occasion.  He was discharged to home in good condition with instruction to continue amoxicillin 1g TID for three more days.  He has hospital follow-up scheduled for September 18, 2013.  Cardiology follow-up is scheduled for September 19, 2013.  AKI on CKD3, resolving  His baseline creatinine is 1.3-1.4.  His creatinine on admission was 2.42 and trended up to 2.83.  His creatinine improved to 1.68 on admission. His FeNa was 1.1% and urine Na 65 which could indicate intrinsic renal pathology however, urinalysis was normal and creatinine improved with fluid.  He was urinating without problem and had no evidence of obstruction. His AKI was likely pre-renal secondary to vomiting since he improved with gentle hydration and increasing po intake.  His ACE inhibitor was held during admission.   Anemia, stable  His hgb at admission was 14.7.  By hospital day 2 it dropped to 10.8.  He noted hemoptysis at admission but denied any significant blood loss, bloody stools or melena.  His hgb remained stable and was 10.6 at discharge.  This was less likely dilutional given such a large drop.  His UA was negative.  He was did not have a bowel movement during admission and declined laxative so an FOBT was not collected.  His INR was supratherapeutic on hospital day 2, subsequently held and became subtherapeutic, 1.84 at discharge.  Given his anemia he will need referral for outpatient  colonoscopy.  Hypertension  His blood pressures were initially low so antihypertensives were held during admission.  His blood pressure regimen was confirmed with Dr. Royann Shivers.  He is on amlodipine (not allergic, per Dr. Royann Shivers), benazepril, nevibolol, losartan-HCTZ.  He has also been prescribed carvedilol to start in 2015 because as of October 18, 2013 his insurance will no longer cover nevibolol.  He still has nevibolol and is taking it until he runs out.  He has been instructed not to take carvedilol while he is still taking nevibolol.    Hx of DVT Coumadin was dosed by pharmacy during admission.  He is on 2.5mg  daily as an outpatient.  He received 2.5mg  on hospital day 1.  INR was 3.21 the following day and coumadin was held.  He got 1mg  on hospital day 3 and INR was subtherapeutic, 1.84 by hospital day 4. Per hospital pharmacist's recommendation he was discharged on his usual dose of 2.5mg  daily.  The pharmacist at his coumadin clinic has agreed to contact him with an appointment date to check his INR.   DISCHARGE DATA: Vital Signs: BP 173/89  Pulse 87  Temp(Src) 99.1 F (37.3 C) (Oral)  Resp 18  Ht 6\' 1"  (1.854 m)  Wt 110.678 kg (244 lb)  BMI 32.20 kg/m2  SpO2 97%  Labs: Results for orders placed during the hospital encounter of 09/09/13 (from the past 24 hour(s))  PROTIME-INR     Status: Abnormal   Collection Time    09/12/13  5:25 AM      Result Value Range   Prothrombin Time 20.7 (*) 11.6 - 15.2 seconds   INR 1.84 (*) 0.00 - 1.49  CBC WITH DIFFERENTIAL     Status: Abnormal   Collection Time    09/12/13  5:25 AM      Result Value Range   WBC 8.4  4.0 - 10.5 K/uL   RBC 3.35 (*) 4.22 - 5.81 MIL/uL   Hemoglobin 10.6 (*) 13.0 - 17.0 g/dL   HCT 16.1 (*) 09.6 - 04.5 %   MCV 89.9  78.0 - 100.0 fL   MCH 31.6  26.0 - 34.0 pg   MCHC 35.2  30.0 - 36.0 g/dL   RDW 40.9  81.1 - 91.4 %   Platelets 221  150 - 400 K/uL   Neutrophils Relative % 80 (*) 43 - 77 %   Neutro Abs 6.7  1.7  - 7.7 K/uL   Lymphocytes Relative 10 (*) 12 - 46 %   Lymphs Abs 0.8  0.7 - 4.0 K/uL   Monocytes Relative 7  3 - 12 %   Monocytes Absolute 0.6  0.1 - 1.0 K/uL   Eosinophils Relative 3  0 - 5 %   Eosinophils Absolute 0.3  0.0 - 0.7 K/uL   Basophils Relative 0  0 - 1 %   Basophils Absolute 0.0  0.0 - 0.1 K/uL  BASIC METABOLIC PANEL     Status: Abnormal   Collection Time    09/12/13  5:25 AM      Result Value Range   Sodium 141  135 - 145 mEq/L   Potassium 3.6  3.5 - 5.1 mEq/L   Chloride 109  96 - 112 mEq/L   CO2 20  19 - 32 mEq/L   Glucose, Bld 85  70 - 99 mg/dL   BUN 25 (*) 6 - 23 mg/dL   Creatinine, Ser 7.82 (*) 0.50 - 1.35 mg/dL   Calcium 8.4  8.4 - 95.6 mg/dL   GFR calc non Af Amer 40 (*) >90 mL/min   GFR calc Af Amer 47 (*) >90 mL/min     Services Ordered on Discharge: Y = Yes; Blank = No PT:   OT:   RN:   Equipment:   Other:      Time Spent on Discharge: 35 min   Signed: Evelena Peat PGY 1, Internal Medicine Resident 09/12/2013, 1:54 PM

## 2013-09-12 NOTE — Progress Notes (Signed)
Physical Therapy Treatment Patient Details Name: Tony Zhang MRN: [id number] DOB: 03/07/1946 Today's Date: 09/12/2013 Time: 4098-1191 PT Time Calculation (min): 19 min  PT Assessment / Plan / Recommendation  History of Present Illness     PT Comments   Pt agreeable to participate in therapy.  Very eager to d/c home.  Fatigues quickly & with SOB with minimal activity but was able to increase ambulation distance & complete stair training this session.  He required multiple standing rest breaks to complete distance.  Educated pt on pacing/energy conservation technique with activities.  Cont;'d to recommend to pt to have intermittent supervision & HHPT but pt refusing.     Follow Up Recommendations  Home health PT;Other (comment)     Does the patient have the potential to tolerate intense rehabilitation     Barriers to Discharge        Equipment Recommendations       Recommendations for Other Services    Frequency Min 3X/week   Progress towards PT Goals    Plan Current plan remains appropriate    Precautions / Restrictions Precautions Precautions: Fall   Pertinent Vitals/Pain 02 sats:  98% RA at rest at beginning of sesion                87-91% RA with ambulation.  Quickly recovered back to 90% with standing rest break & pursed lip breathing                96% RA at end of session    Mobility  Bed Mobility Bed Mobility: Supine to Sit;Sitting - Scoot to Edge of Bed Supine to Sit: 6: Modified independent (Device/Increase time);HOB flat Sitting - Scoot to Edge of Bed: 6: Modified independent (Device/Increase time) Transfers Transfers: Sit to Stand;Stand to Sit Sit to Stand: With upper extremity assist;From bed;6: Modified independent (Device/Increase time) Stand to Sit: 6: Modified independent (Device/Increase time);With upper extremity assist;With armrests;To chair/3-in-1 Ambulation/Gait Ambulation/Gait Assistance: 4: Min guard Ambulation Distance (Feet): 100  Feet Assistive device: None Ambulation/Gait Assistance Details: slow & fatigues quickly.  Required multiple standing rest breaks.  Cues for breathing technique.  02 sats monitored throughout distance with sats dropping to 87% but quickly recovered back 90-92% with standing rest break & pursed lip breathing.   Gait Pattern: Step-through pattern;Decreased stride length;Wide base of support Gait velocity: decreased General Gait Details: Spoke to pt about use of RW to help with energy conservation but pt refusing.   Stairs: Yes Stairs Assistance: 4: Min guard Stair Management Technique: Two rails;Alternating pattern;Forwards Number of Stairs: 5 Wheelchair Mobility Wheelchair Mobility: No      PT Goals (current goals can now be found in the care plan section) Acute Rehab PT Goals PT Goal Formulation: With patient Time For Goal Achievement: 09/18/13 Potential to Achieve Goals: Good  Visit Information  Last PT Received On: 09/12/13 Assistance Needed: +1    Subjective Data      Cognition  Cognition Arousal/Alertness: Awake/alert Behavior During Therapy: WFL for tasks assessed/performed Overall Cognitive Status: Within Functional Limits for tasks assessed    Balance     End of Session PT - End of Session Activity Tolerance: Patient tolerated treatment well Patient left: in chair;with call bell/phone within reach Nurse Communication: Mobility status   GP     Lara Mulch 09/12/2013, 12:38 PM  Verdell Face, PTA [phone] 09/12/2013

## 2013-09-15 LAB — CULTURE, BLOOD (ROUTINE X 2): Culture: NO GROWTH

## 2013-09-17 ENCOUNTER — Ambulatory Visit (HOSPITAL_BASED_OUTPATIENT_CLINIC_OR_DEPARTMENT_OTHER): Payer: Medicare Other | Admitting: Lab

## 2013-09-17 ENCOUNTER — Ambulatory Visit (HOSPITAL_BASED_OUTPATIENT_CLINIC_OR_DEPARTMENT_OTHER): Payer: Medicare Other | Admitting: Pharmacist

## 2013-09-17 DIAGNOSIS — I82409 Acute embolism and thrombosis of unspecified deep veins of unspecified lower extremity: Secondary | ICD-10-CM

## 2013-09-17 DIAGNOSIS — I82402 Acute embolism and thrombosis of unspecified deep veins of left lower extremity: Secondary | ICD-10-CM

## 2013-09-17 LAB — LIPID PANEL
Cholesterol: 153 mg/dL (ref 0–200)
HDL: 18 mg/dL — ABNORMAL LOW (ref >39)
LDL Cholesterol: 75 mg/dL (ref 0–99)

## 2013-09-17 LAB — CBC
HCT: 35.8 % — ABNORMAL LOW (ref 39.0–52.0)
Hemoglobin: 12.7 g/dL — ABNORMAL LOW (ref 13.0–17.0)
MCHC: 35.5 g/dL (ref 30.0–36.0)
MCV: 86.5 fL (ref 78.0–100.0)
RDW: 14.4 % (ref 11.5–15.5)
WBC: 10.5 10*3/uL (ref 4.0–10.5)

## 2013-09-17 LAB — PROTIME-INR
INR: 2.7 (ref 2.00–3.50)
Protime: 32.4 Seconds — ABNORMAL HIGH (ref 10.6–13.4)

## 2013-09-17 LAB — COMPREHENSIVE METABOLIC PANEL
AST: 16 U/L (ref 0–37)
Albumin: 3.8 g/dL (ref 3.5–5.2)
Alkaline Phosphatase: 46 U/L (ref 39–117)
BUN: 26 mg/dL — ABNORMAL HIGH (ref 6–23)
Calcium: 8.8 mg/dL (ref 8.4–10.5)
Chloride: 102 mEq/L (ref 96–112)
Potassium: 3.5 mEq/L (ref 3.5–5.3)
Sodium: 137 mEq/L (ref 135–145)
Total Protein: 6.5 g/dL (ref 6.0–8.3)

## 2013-09-17 LAB — POCT INR: INR: 2.7

## 2013-09-17 NOTE — Progress Notes (Signed)
INR = 2.7   Goal 2-3 Patient states he was hospitalized last week with pneumonia secondary to aspiration following food poisoning. While in hospital, he states he had blood in his sputum. Discharging MD requested INR check since patient has been receiving amoxicillin. Patient is feeling better, no bleeding noted.  He still complains of shortness of breath. INR stable and within goal range.   Will continue Coumadin 2.5 mg daily and will leave patient's next appt for 11/07/13 with lab at 11:30am and [facility] Clinic at 11:45am. Patient will call us if he has any problems in the meantime.    Cletis Athens, PharmD

## 2013-09-18 ENCOUNTER — Encounter: Payer: Self-pay | Admitting: Internal Medicine

## 2013-09-18 ENCOUNTER — Encounter: Payer: Self-pay | Admitting: *Deleted

## 2013-09-18 ENCOUNTER — Ambulatory Visit (INDEPENDENT_AMBULATORY_CARE_PROVIDER_SITE_OTHER): Payer: Medicare Other | Admitting: Internal Medicine

## 2013-09-18 ENCOUNTER — Encounter: Payer: Self-pay | Admitting: Cardiovascular Disease

## 2013-09-18 VITALS — BP 153/96 | HR 79 | Temp 97.2°F | Ht 73.0 in | Wt 237.6 lb

## 2013-09-18 DIAGNOSIS — J189 Pneumonia, unspecified organism: Secondary | ICD-10-CM

## 2013-09-18 DIAGNOSIS — D649 Anemia, unspecified: Secondary | ICD-10-CM

## 2013-09-18 DIAGNOSIS — D509 Iron deficiency anemia, unspecified: Secondary | ICD-10-CM | POA: Insufficient documentation

## 2013-09-18 DIAGNOSIS — N183 Chronic kidney disease, stage 3 unspecified: Secondary | ICD-10-CM

## 2013-09-18 NOTE — Progress Notes (Signed)
Patient ID: Tony Zhang, male   DOB: 02-Nov-1945, 67 y.o.   MRN: [id number]   Subjective:   Patient ID: Tony Zhang male   DOB: July 13, 1946 67 y.o.   MRN: [id number]  HPI: [name] is a 67 y.o. with past medical history of hypertension, AAA, DVT on coumadin, gout, HL, and OSA who presents for hospital follow-up visit of recent hospitalization for community acquired pneumonia.   Patient was hospitalized from 11/23 - 11/26 for cough with hemoptysis, dyspnea, and pleuritic right-sided CP with chest xray findings suggestive of right middle/lower lobe pneumonia who improved with antibiotic treatment (azithromycin, ceftriaxone, amoxicillin).  Final blood cultures were negative for growth. Patient was instructed to take amoxicillin 1g TID for three more days which he states he has been compliant with, however still with 2 more pills left to take which should have been completed on 11/29.   He reports he is feeling much better since being discharged. He states he still has cough but it is no longer productive (clear phlegm) in nature and no reports of hemoptysis. He is dyspneic with exertion but not at rest. No fever, chills, rhinorrhea, sore throat, tender lymph nodes, or chest pain.   He was also found to have AKI on CKD and acute normocytic anemia during last admission. He reports good appetite and PO intake with no changes in urination or bowel movement.  He denies hematemesis, hematuria,  melena , or hematochezia. Patient reports taking ibuprofen chronically for years for joint pain. No recent acute flare-ups of gout.     Past Medical History  Diagnosis Date  . Venous thrombosis     Recurrent  . Peripheral vascular disease   . Dyslipidemia   . AAA (abdominal aortic aneurysm)   . COPD (chronic obstructive pulmonary disease)   . Gout   . DJD (degenerative joint disease)   . Malignant hypertension   . Pneumonia 08/2013  . Shortness of breath    Current Outpatient Prescriptions    Medication Sig Dispense Refill  . albuterol (PROVENTIL HFA;VENTOLIN HFA) 108 (90 BASE) MCG/ACT inhaler Inhale 1 puff into the lungs every 6 (six) hours as needed for wheezing or shortness of breath.      [name] amLODipine (NORVASC) 2.5 MG tablet Take 1 tablet (2.5 mg total) by mouth daily.  90 tablet  3  . amoxicillin (AMOXIL) 500 MG capsule Take 2 capsules (1,000 mg total) by mouth every 8 (eight) hours.  18 capsule  0  . benazepril (LOTENSIN) 40 MG tablet Take 1 tablet (40 mg total) by mouth daily.  90 tablet  3  . carvedilol (COREG) 12.5 MG tablet Take 1 tablet (12.5 mg total) by mouth 2 (two) times daily.  180 tablet  3  . cilostazol (PLETAL) 100 MG tablet Take 1 tablet (100 mg total) by mouth 2 (two) times daily.  180 tablet  3  . fenofibrate 160 MG tablet Take 1 tablet (160 mg total) by mouth daily.  90 tablet  3  . ibuprofen (ADVIL,MOTRIN) 800 MG tablet 1 po bid prn  60 tablet  3  . LEVITRA 20 MG tablet Take 20 mg by mouth daily as needed.       [name] losartan-hydrochlorothiazide (HYZAAR) 100-25 MG per tablet Take 1 tablet by mouth daily.  90 tablet  3  . nebivolol (BYSTOLIC) 10 MG tablet Take 20 mg by mouth daily.      . simvastatin (ZOCOR) 40 MG tablet Take 1 tablet (40 mg total) by  mouth at bedtime.  90 tablet  3  . warfarin (COUMADIN) 2.5 MG tablet Take 2.5 mg by mouth daily.       No current facility-administered medications for this visit.   No family history on file. History   Social History  . Marital Status: Divorced    Spouse Name: N/A    Number of Children: N/A  . Years of Education: N/A   Social History Main Topics  . Smoking status: Former Smoker    Quit date: 10/18/1992  . Smokeless tobacco: Never Used  . Alcohol Use: No  . Drug Use: No  . Sexual Activity: None   Other Topics Concern  . None   Social History Narrative  . None   Review of Systems: Review of Systems  Constitutional: Positive for diaphoresis (at night). Negative for fever, chills, weight loss and  malaise/fatigue.  HENT: Negative for congestion and sore throat.   Respiratory: Positive for cough (non-productive, improved) and shortness of breath (with exertion). Negative for hemoptysis, sputum production and wheezing.   Gastrointestinal: Negative for nausea, vomiting, abdominal pain, diarrhea, constipation, blood in stool and melena.  Genitourinary: Negative for dysuria, urgency, frequency and hematuria.  Musculoskeletal: Positive for neck pain. Negative for joint pain.  Skin: Negative for rash.  Neurological: Negative for dizziness, weakness and headaches.  Endo/Heme/Allergies: Does not bruise/bleed easily.   Objective:  Physical Exam: Filed Vitals:   09/18/13 1006 09/18/13 1108  BP: 167/87 153/96  Pulse: 77 79  Temp: 97.2 F (36.2 C)   TempSrc: Oral   Height: 6\' 1"  (1.854 m)   Weight: 237 lb 9.6 oz (107.775 kg)   SpO2: 95%    Physical Exam  Constitutional: He is oriented to person, place, and time. He appears well-developed and well-nourished. No distress.  HENT:  Head: Normocephalic and atraumatic.  Right Ear: External ear normal.  Left Ear: External ear normal.  Nose: Nose normal.  Mouth/Throat: Oropharynx is clear and moist. No oropharyngeal exudate.  Eyes: Conjunctivae and EOM are normal. Right eye exhibits no discharge. Left eye exhibits no discharge. No scleral icterus.  Neck: Normal range of motion. Neck supple.  Cardiovascular: Normal rate, regular rhythm and normal heart sounds.   Pulmonary/Chest: Effort normal and breath sounds normal. No respiratory distress. He has no wheezes. He has no rales.  Abdominal: Soft. Bowel sounds are normal. He exhibits no distension. There is no tenderness. There is no guarding.  Musculoskeletal: Normal range of motion. He exhibits no edema and no tenderness.  Neurological: He is alert and oriented to person, place, and time. No cranial nerve deficit.  Skin: Skin is warm and dry. No rash noted. He is not diaphoretic. No erythema.  No pallor.  Psychiatric: He has a normal mood and affect. His behavior is normal. Judgment and thought content normal.    Assessment & Plan:   Community Acquired Pneumonia   Assessment: Patient was hospitalized from 11/23 - 11/26 for uncomplicated right middle/lower lobe community acquired pneumonia who improved with 7 day course of antibiotic treatment  (azithromycin, ceftriaxone, amoxicillin).   Plan: Pt given appointment for follow-up with his PCP Dr. Clarene Duke who will obtain chest xray in 5 weeks    AKI on CKD Stage 3   Assessment: Patient with baseline CR of 1.3 with AKI during hospitalization thought to be due to pre-renal azotemia with improved renal function that is slightly above baseline since discharge.  Plan: Repeat BMP with improved renal function from discharge 1.68 to 1.56. Pt advised to  take tylenol instead of ibuprofen for relief of joint pain as this can worsen his renal function. PCP to address pain control with alternative to NSAIDs and continue monitoring his renal function.    Acute Normocytic Anemia         Assessment: Patient with development of acute normocytic anemia with Hg low  of 10.4 not requiring blood transfusion during hospitalization from 11/23-11/26 that has improved with no recent reports of active bleeding. Pt reports receiving colonoscopy more than 10 years ago.  Plan: Repeat CBC with Hg 12.7 from 10.6 on discharge. Pt to follow-up with PCP for possible colonoscopy and monitoring of his Hg.

## 2013-09-18 NOTE — Patient Instructions (Addendum)
-  You're pneumonia seems to have resolved, please complete your antibiotic course today -Please continue your home medications, STOP taking ibuprofen and other NSAIDS, try tylenol instead -Will schedule an appointment with Dr. Clarene Duke in 2-5 weeks for repeat chest x-ray and referral for colonoscopy

## 2013-09-18 NOTE — Assessment & Plan Note (Signed)
Assessment: Patient with development of acute normocytic anemia with Hg low of 10.4 not requiring blood transfusion during hospitalization from 11/23-11/26 (presented with hemoptysis) that has improved with no recent reports of active bleeding. Pt reports receiving colonoscopy more than 10 years ago.   Plan:  -Repeat CBC with Hg improved from 10.6 to 12.7 -Pt to follow-up with PCP for possible colonoscopy and monitoring of his Hg for normalization

## 2013-09-18 NOTE — Assessment & Plan Note (Addendum)
Assessment: Patient was hospitalized from 11/23 - 11/26 for uncomplicated  right middle/lower lobe community acquired pneumonia who improved with 7 day course of antibiotic treatment  (azithromycin, ceftriaxone, amoxicillin).    Plan: -Pt given appointment for follow-up with his PCP Dr. Clarene Duke who will obtain chest xray in 5 weeks

## 2013-09-18 NOTE — Assessment & Plan Note (Addendum)
Assessment: Patient with baseline CR of 1.3 with AKI during hospitalization thought to be due to pre-renal azotemia with improved renal function that is slightly above baseline since discharge.   Plan: -Repeat BMP with improved renal function from 1.68 to 1.56 -Pt advised to take tylenol instead of ibuprofen for relief of joint pain as this can worsen his renal function -PCP to address pain control with alternative to NSAIDs  -PCP to continue monitoring renal function

## 2013-09-19 ENCOUNTER — Encounter: Payer: Self-pay | Admitting: Cardiology

## 2013-09-19 ENCOUNTER — Ambulatory Visit (INDEPENDENT_AMBULATORY_CARE_PROVIDER_SITE_OTHER): Payer: Medicare Other | Admitting: Cardiology

## 2013-09-19 VITALS — BP 140/90 | HR 80 | Ht 73.0 in | Wt 247.0 lb

## 2013-09-19 DIAGNOSIS — J189 Pneumonia, unspecified organism: Secondary | ICD-10-CM

## 2013-09-19 DIAGNOSIS — N189 Chronic kidney disease, unspecified: Secondary | ICD-10-CM

## 2013-09-19 DIAGNOSIS — I1310 Hypertensive heart and chronic kidney disease without heart failure, with stage 1 through stage 4 chronic kidney disease, or unspecified chronic kidney disease: Secondary | ICD-10-CM

## 2013-09-19 NOTE — Patient Instructions (Signed)
Continue taking medications as prescribed. Contact our office as needed. Continue with your yearly follow-ups with Dr. Royann Shivers.

## 2013-09-19 NOTE — Discharge Summary (Signed)
I saw Tony Zhang on day of discharge, he was much improved.  I assisted with the discharge planning.

## 2013-09-19 NOTE — Assessment & Plan Note (Signed)
Well controlled in office today at 140/90. Denies any chest pain. Reports daily compliance with medications.

## 2013-09-19 NOTE — Assessment & Plan Note (Signed)
Improved. Has completed course of antibiotics. Final blood cultures were negative. Productive cough and pleuritic chest pain resolved. PCP to perform repeat CXR in 2 months.

## 2013-09-19 NOTE — Progress Notes (Signed)
09/19/2013 Tony Zhang   09/16/1946  [id number]  Primary Physicia [name], MD Primary Cardiologist: Dr. Royann Shivers  HPI:  The patient is a 67 y/o male, followed by Dr. Royann Shivers. He has multiple cardiovascular issues.   He has severe PAD. He has a small abdominal aortic aneurysm in the infrarenal area and then the aorta is totally occluded above the iliac bifurcation. He has bilateral severely reduced ABIs of 0.7-0.8. There is collateral flow to the lower extremities only.   He is suspected to have coronary disease, possible occlusion of the right coronary artery as per findings of previous nuclear stress testing. Since he is free of angina and has preserved left ventricular systolic function this is being managed medically.   He also has a history of malignant hypertension. Recently his blood pressure control has been stable. In the past we have had to make numerous adjustments to his antihypertensive regimen since he has occasionally extremely severe hypertension, but also symptomatic orthostatic hypotension. Repeated evaluation for renal artery stenosis has shown that he has non-critical lesions (1-59% stenoses bilaterally).   He presents to clinic today for post-hospital follow-up, following an admission for CAP. He was admitted by Internal Medicine and treated with a course of antibiotics, which he has completed. His symptoms have improved. He was referred back to [place] by Internal Medicine for follow-up for his blood pressure.   Since discharge, he states that he has been doing well. He denies any chest pain. No SOB at rest, but occasionally has DOE. He denies any orthopnea, PND or LEE. He reports compliance with all of his prescribed medications.    Current Outpatient Prescriptions  Medication Sig Dispense Refill  . amLODipine (NORVASC) 2.5 MG tablet Take 1 tablet (2.5 mg total) by mouth daily.  90 tablet  3  . benazepril (LOTENSIN) 40 MG tablet Take 1 tablet (40 mg total) by mouth  daily.  90 tablet  3  . carvedilol (COREG) 12.5 MG tablet Take 1 tablet (12.5 mg total) by mouth 2 (two) times daily.  180 tablet  3  . cilostazol (PLETAL) 100 MG tablet Take 1 tablet (100 mg total) by mouth 2 (two) times daily.  180 tablet  3  . fenofibrate 160 MG tablet Take 1 tablet (160 mg total) by mouth daily.  90 tablet  3  . ibuprofen (ADVIL,MOTRIN) 800 MG tablet 1 po bid prn  60 tablet  3  . LEVITRA 20 MG tablet Take 20 mg by mouth daily as needed.       [name] losartan-hydrochlorothiazide (HYZAAR) 100-25 MG per tablet Take 1 tablet by mouth daily.  90 tablet  3  . nebivolol (BYSTOLIC) 10 MG tablet Take 20 mg by mouth daily.      . simvastatin (ZOCOR) 40 MG tablet Take 1 tablet (40 mg total) by mouth at bedtime.  90 tablet  3  . warfarin (COUMADIN) 2.5 MG tablet Take 2.5 mg by mouth daily.       No current facility-administered medications for this visit.    Allergies  Allergen Reactions  . Amlodipine Other (See Comments)    Muscle weakness  . Codeine     History   Social History  . Marital Status: Divorced    Spouse Name: N/A    Number of Children: N/A  . Years of Education: N/A   Occupational History  . Not on file.   Social History Main Topics  . Smoking status: Former Smoker    Quit date: 10/18/1992  .  Smokeless tobacco: Never Used  . Alcohol Use: No  . Drug Use: No  . Sexual Activity: Not on file   Other Topics Concern  . Not on file   Social History Narrative  . No narrative on file     Review of Systems: General: negative for chills, fever, night sweats or weight changes.  Cardiovascular: negative for chest pain, dyspnea on exertion, edema, orthopnea, palpitations, paroxysmal nocturnal dyspnea or shortness of breath Dermatological: negative for rash Respiratory: negative for cough or wheezing Urologic: negative for hematuria Abdominal: negative for nausea, vomiting, diarrhea, bright red blood per rectum, melena, or hematemesis Neurologic: negative for  visual changes, syncope, or dizziness All other systems reviewed and are otherwise negative except as noted above.    Blood pressure 140/90, pulse 80, height 6\' 1"  (1.854 m), weight 247 lb (112.038 kg).  General appearance: alert, cooperative and no distress Neck: no carotid bruit and no JVD Lungs: clear to auscultation bilaterally Heart: regular rate and rhythm, S1, S2 normal, no murmur, click, rub or gallop Abdomen: soft, non-tender; bowel sounds normal; no masses,  no organomegaly Extremities: no LEE Pulses: 2+ and symmetric Skin: warm and dry Neurologic: Grossly normal   ASSESSMENT AND PLAN:   CAP (community acquired pneumonia) Improved. Has completed course of antibiotics. Final blood cultures were negative. Productive cough and pleuritic chest pain resolved. PCP to perform repeat CXR in 2 months.   Malignant HTN with heart disease, w/o CHF, with chronic kidney disease Well controlled in office today at 140/90. Denies any chest pain. Reports daily compliance with medications.     PLAN  Tony Zhang presents to clinic for post-hospital follow up after a recent admission for CAP, which was managed by Internal Medicine. The primary reason for today's visit was for follow-up of his hypertension. His BP is well controlled in clinic today at 140/90. He reports compliance with all of his prescribed medications. He denies any recent history of chest pain or resting dyspnea. He presented to today's visit without any complaints. I have instructed him to continue taking his mediations as prescribed. He will continue to follow-up with Dr. Royann Shivers on a yearly basis. He was instructed to contact our office if any issues arise in the meantime. He verbalized understanding.   [name] PA-C 09/19/2013 3:07 PM

## 2013-09-20 NOTE — Progress Notes (Signed)
I saw and evaluated the patient.  I personally confirmed the key portions of the history and exam documented by Dr. Rabbani and I reviewed pertinent patient test results.  The assessment, diagnosis, and plan were formulated together and I agree with the documentation in the resident's note.  

## 2013-10-10 ENCOUNTER — Telehealth: Payer: Self-pay | Admitting: *Deleted

## 2013-10-10 NOTE — Telephone Encounter (Signed)
[facility]ed [facility] prior authorization for bystolic 20mg  PO QD

## 2013-11-07 ENCOUNTER — Other Ambulatory Visit (HOSPITAL_BASED_OUTPATIENT_CLINIC_OR_DEPARTMENT_OTHER): Payer: Medicare Other

## 2013-11-07 ENCOUNTER — Ambulatory Visit (HOSPITAL_BASED_OUTPATIENT_CLINIC_OR_DEPARTMENT_OTHER): Payer: Self-pay | Admitting: Pharmacist

## 2013-11-07 DIAGNOSIS — I82402 Acute embolism and thrombosis of unspecified deep veins of left lower extremity: Secondary | ICD-10-CM

## 2013-11-07 DIAGNOSIS — Z7901 Long term (current) use of anticoagulants: Secondary | ICD-10-CM

## 2013-11-07 DIAGNOSIS — I82409 Acute embolism and thrombosis of unspecified deep veins of unspecified lower extremity: Secondary | ICD-10-CM

## 2013-11-07 LAB — PROTIME-INR
INR: 1.6 — ABNORMAL LOW (ref 2.00–3.50)
PROTIME: 19.2 s — AB (ref 10.6–13.4)

## 2013-11-07 LAB — POCT INR: INR: 1.6

## 2013-11-07 NOTE — Progress Notes (Signed)
INR = 1.6   Goal 2-3 Patient came in and had labs drawn. He left before being seen. Appt was for 11:45am, patient paged at 11:50 and he was gone at that time. Called patient at home and spoke with patient. He states that he has told us repeatedly that if he is not seen at the exact time of his appointment he will leave. Patient has had no medication changes. He has no dietary changes. He states that his INR has fallen at his present dose in the past, then "comes right back up." He will take Coumadin 5mg  today, then resume Coumadin 2.5 mg daily. He usually comes to the clinic every 2 months. He will return to the Coumadin Clinic on 01/02/14 for lab at 11:00 and Coumadin Clinic at 11:15.  Cletis AthensLisa Demitria Hay, PharmD  No Charge - telephone encounter only

## 2013-12-10 ENCOUNTER — Telehealth: Payer: Self-pay | Admitting: *Deleted

## 2013-12-10 NOTE — Telephone Encounter (Signed)
[employer] contacted for approval of Bystolic. Patient has been on Bystolic since 11/2007 and stable.  Approved through 10/17/14.  [facility] and patient will be notified.

## 2013-12-17 ENCOUNTER — Telehealth: Payer: Self-pay | Admitting: Cardiovascular Disease

## 2013-12-17 NOTE — Telephone Encounter (Signed)
Please call-having a  problem getting his Benazepril, He needs you to call [facility] Part [phone].

## 2013-12-17 NOTE — Telephone Encounter (Signed)
Returned call and pt verified x 2.  Pt informed message received and RN asked what issue he is having w/ getting benazepril.  Pt stated there is a conflict with one of his other medicines and the insurance company needs to talk to the doctor or the pharmacy to get it cleared up so he doesn't have to do this every time he needs a refill.  Pt informed they will be contacted he will be notified of the outcome.  Pt verbalized understanding and agreed w/ plan.  Pt has a few days of medicine left.  Call to number left ([facility] Help Desk).  Multiple menu options.  Will defer to Britta MccreedyBarbara, CMA to [employer] insurance company as pt may need PA.  Forwarded to [place], [place].

## 2013-12-19 MED ORDER — NEBIVOLOL HCL 10 MG PO TABS: 10.0000 mg | ORAL_TABLET | Freq: Two times a day (BID) | ORAL | Status: DC

## 2013-12-19 NOTE — Telephone Encounter (Signed)
Benazepril 40mg  refilled 12/17/13 per [facility].  There was nothing we needed to do - the problems was with the pharmacy and insurance company and this has been resolved.  Patient requested refills on Bystolic 20mg  qd.  Patient states he gets the 10mg  and takes bid because his insurance will not pay for the 20mg  tablets and was told to take the 10mg  bid.  Refill sent in.

## 2014-01-02 ENCOUNTER — Other Ambulatory Visit (HOSPITAL_BASED_OUTPATIENT_CLINIC_OR_DEPARTMENT_OTHER): Payer: Medicare Other

## 2014-01-02 ENCOUNTER — Ambulatory Visit (HOSPITAL_BASED_OUTPATIENT_CLINIC_OR_DEPARTMENT_OTHER): Payer: Medicare Other | Admitting: Pharmacist

## 2014-01-02 DIAGNOSIS — I82402 Acute embolism and thrombosis of unspecified deep veins of left lower extremity: Secondary | ICD-10-CM

## 2014-01-02 DIAGNOSIS — I82409 Acute embolism and thrombosis of unspecified deep veins of unspecified lower extremity: Secondary | ICD-10-CM

## 2014-01-02 LAB — POCT INR: INR: 3.2

## 2014-01-02 LAB — PROTIME-INR
INR: 3.2 (ref 2.00–3.50)
PROTIME: 38.4 s — AB (ref 10.6–13.4)

## 2014-01-02 NOTE — Progress Notes (Signed)
INR = 3.2 INR slightly supratherapeutic. No bleeding noted. He has a few small (dime to quarter size) bruises on arms from working on cars. No new medications. Patient stable at current dose. He will return in 2 months on 03/06/14 for lab at 11:00 and Coumadin Clinic at 11:15.  He has changed pharmacies.  If he needs refills he will call and let us know where to send future refill authorizations.    Cletis AthensLisa Milagros Middendorf, PharmD

## 2014-01-14 ENCOUNTER — Telehealth: Payer: Self-pay | Admitting: Cardiovascular Disease

## 2014-01-14 NOTE — Telephone Encounter (Signed)
Returned call and informed pt has active script on file at [facility].  Asked if pt transferred there recently.  Informed pt brought in empty pill bottle [facility].  Informed [facility] for [facility] supply w/ 3 refills written in Oct 2014.  Pharmacy will call [facility] to check on transfer.

## 2014-01-14 NOTE — Telephone Encounter (Signed)
Need a new prescription for his Pletal 100 mg #60.

## 2014-02-04 ENCOUNTER — Other Ambulatory Visit: Payer: Self-pay | Admitting: *Deleted

## 2014-02-04 MED ORDER — CILOSTAZOL 100 MG PO TABS: 100.0000 mg | ORAL_TABLET | Freq: Two times a day (BID) | ORAL | Status: DC

## 2014-02-04 NOTE — Telephone Encounter (Signed)
cilostazol 100mg  refilled #180 w/2 refills [facility].

## 2014-02-05 ENCOUNTER — Other Ambulatory Visit: Payer: Self-pay | Admitting: *Deleted

## 2014-03-06 ENCOUNTER — Other Ambulatory Visit: Payer: Self-pay | Admitting: Pharmacist

## 2014-03-06 ENCOUNTER — Other Ambulatory Visit (HOSPITAL_BASED_OUTPATIENT_CLINIC_OR_DEPARTMENT_OTHER): Payer: Medicare Other

## 2014-03-06 ENCOUNTER — Ambulatory Visit (HOSPITAL_BASED_OUTPATIENT_CLINIC_OR_DEPARTMENT_OTHER): Payer: Medicare Other | Admitting: Pharmacist

## 2014-03-06 DIAGNOSIS — I82402 Acute embolism and thrombosis of unspecified deep veins of left lower extremity: Secondary | ICD-10-CM

## 2014-03-06 DIAGNOSIS — I82409 Acute embolism and thrombosis of unspecified deep veins of unspecified lower extremity: Secondary | ICD-10-CM

## 2014-03-06 LAB — PROTIME-INR
INR: 3.2 (ref 2.00–3.50)
PROTIME: 38.4 s — AB (ref 10.6–13.4)

## 2014-03-06 LAB — POCT INR: INR: 3.2

## 2014-03-06 NOTE — Progress Notes (Signed)
INR right above goal of 2-3.  [name] states that he has bruising on arm when he bumps them while working on car.  No med changes.  He is still taking ibuprofen 800mg  TID for arthritis, which he has done since starting coumadin.  We discussed increased bleeding and risk of bleeding.  Will check a CBC with next coumadin clinic visit.  Continue coumadin 2.5mg  daily and check PT/INR in 2 months.

## 2014-05-01 ENCOUNTER — Other Ambulatory Visit (HOSPITAL_BASED_OUTPATIENT_CLINIC_OR_DEPARTMENT_OTHER): Payer: Medicare Other

## 2014-05-01 ENCOUNTER — Ambulatory Visit (HOSPITAL_BASED_OUTPATIENT_CLINIC_OR_DEPARTMENT_OTHER): Payer: Medicare Other | Admitting: Pharmacist

## 2014-05-01 DIAGNOSIS — I82409 Acute embolism and thrombosis of unspecified deep veins of unspecified lower extremity: Secondary | ICD-10-CM

## 2014-05-01 DIAGNOSIS — I82402 Acute embolism and thrombosis of unspecified deep veins of left lower extremity: Secondary | ICD-10-CM

## 2014-05-01 LAB — CBC WITH DIFFERENTIAL/PLATELET
BASO%: 0.4 % (ref 0.0–2.0)
BASOS ABS: 0 10*3/uL (ref 0.0–0.1)
EOS%: 2.8 % (ref 0.0–7.0)
Eosinophils Absolute: 0.3 10*3/uL (ref 0.0–0.5)
HCT: 38.8 % (ref 38.4–49.9)
HGB: 13.7 g/dL (ref 13.0–17.1)
LYMPH%: 13.7 % — ABNORMAL LOW (ref 14.0–49.0)
MCH: 30.1 pg (ref 27.2–33.4)
MCHC: 35.3 g/dL (ref 32.0–36.0)
MCV: 85.3 fL (ref 79.3–98.0)
MONO#: 0.6 10*3/uL (ref 0.1–0.9)
MONO%: 6.5 % (ref 0.0–14.0)
NEUT#: 7 10*3/uL — ABNORMAL HIGH (ref 1.5–6.5)
NEUT%: 76.6 % — ABNORMAL HIGH (ref 39.0–75.0)
Platelets: 280 10*3/uL (ref 140–400)
RBC: 4.55 10*6/uL (ref 4.20–5.82)
RDW: 14.8 % — AB (ref 11.0–14.6)
WBC: 9.1 10*3/uL (ref 4.0–10.3)
lymph#: 1.2 10*3/uL (ref 0.9–3.3)
nRBC: 0 % (ref 0–0)

## 2014-05-01 LAB — POCT INR: INR: 3

## 2014-05-01 LAB — PROTIME-INR
INR: 3 (ref 2.00–3.50)
Protime: 36 Seconds — ABNORMAL HIGH (ref 10.6–13.4)

## 2014-05-01 NOTE — Progress Notes (Signed)
INR within goal today. Pt took coumadin as instructed. No missed or extra coumadin doses. No changes in diet or medications. 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 They heal and resolve. No s/s of clotting noted. Will continue 2.5mg  daily.   Recheck INR in ~ 2 months on 07/15/14 at 10:15am for lab, 10:30am for coumadin clinic and 11am for MD visit.

## 2014-05-01 NOTE — Patient Instructions (Signed)
Continue 2.5mg  daily.   Recheck INR in ~ 2 months on 07/15/14 at 10:15am for lab, 10:30am for coumadin clinic and 11am for MD visit.

## 2014-05-06 ENCOUNTER — Other Ambulatory Visit: Payer: Self-pay | Admitting: Cardiovascular Disease

## 2014-05-06 NOTE — Telephone Encounter (Signed)
Rx refill denied to patient pharmacy, note to pharmacy to please send to patient PCP

## 2014-07-08 ENCOUNTER — Other Ambulatory Visit: Payer: Self-pay | Admitting: Cardiovascular Disease

## 2014-07-08 NOTE — Telephone Encounter (Signed)
Rx was sent to pharmacy electronically. 

## 2014-07-12 ENCOUNTER — Other Ambulatory Visit: Payer: Self-pay | Admitting: *Deleted

## 2014-07-12 DIAGNOSIS — I82402 Acute embolism and thrombosis of unspecified deep veins of left lower extremity: Secondary | ICD-10-CM

## 2014-07-15 ENCOUNTER — Ambulatory Visit (HOSPITAL_BASED_OUTPATIENT_CLINIC_OR_DEPARTMENT_OTHER): Payer: Medicare Other | Admitting: Nurse Practitioner

## 2014-07-15 ENCOUNTER — Ambulatory Visit: Payer: Medicare Other | Admitting: Oncology

## 2014-07-15 ENCOUNTER — Other Ambulatory Visit (HOSPITAL_BASED_OUTPATIENT_CLINIC_OR_DEPARTMENT_OTHER): Payer: Medicare Other

## 2014-07-15 ENCOUNTER — Ambulatory Visit (HOSPITAL_BASED_OUTPATIENT_CLINIC_OR_DEPARTMENT_OTHER): Payer: Medicare Other | Admitting: Pharmacist

## 2014-07-15 VITALS — BP 149/78 | HR 67 | Temp 98.3°F | Resp 20 | Ht 73.0 in | Wt 228.2 lb

## 2014-07-15 DIAGNOSIS — I82402 Acute embolism and thrombosis of unspecified deep veins of left lower extremity: Secondary | ICD-10-CM

## 2014-07-15 DIAGNOSIS — I82409 Acute embolism and thrombosis of unspecified deep veins of unspecified lower extremity: Secondary | ICD-10-CM

## 2014-07-15 DIAGNOSIS — Z7901 Long term (current) use of anticoagulants: Secondary | ICD-10-CM

## 2014-07-15 LAB — PROTIME-INR
INR: 4.2 — ABNORMAL HIGH (ref 2.00–3.50)
PROTIME: 50.4 s — AB (ref 10.6–13.4)

## 2014-07-15 LAB — POCT INR: INR: 4.2

## 2014-07-15 NOTE — Progress Notes (Signed)
INR = 4.2 on Coumadin 2.5 mg daily Has a bruise on R wrist.  No bleeding. He has used more Ibuprofen recently due to "damp weather"; arthritis pain increased. No diet or med changes. No missed or extra doses of Coumadin. INR elevated-- potentially increased Ibuprofen use.  He has been on normal/high side of INR goal now x 3 visits.  He will hold today's Coumadin dose then go back to 2.5 mg/day this week. Next week, he will take Coumadin 2.5 mg daily except 1.25 mg on Wed/Fri if his INR is ok when he returns next Wednesday. Pt understands plan. Ebony Hail, Pharm.D., CPP 07/15/2014@11 :45 AM

## 2014-07-15 NOTE — Progress Notes (Signed)
  [facility] OFFICE PROGRESS NOTE   Diagnosis:  History of recurrent venous thrombosis  INTERVAL HISTORY:   Mr. Kos returns as scheduled. He continues Coumadin. He denies bleeding. No leg swelling or calf pain. He has stable chronic dyspnea on exertion. No chest pain. He reports changing his diet and beginning an exercise program in an attempt to lose weight.  Objective:  Vital signs in last 24 hours:  Blood pressure 149/78, pulse 67, temperature 98.3 F (36.8 C), temperature source Oral, resp. rate 20, height  (1.854 m), weight 228 lb 3.2 oz (103.511 kg), SpO2 100.00%.    HEENT: No thrush or ulcers. Lymphatics: No palpable cervical, supraclavicular or axillary lymph nodes. Resp: Lungs clear bilaterally. Cardio: Regular rate and rhythm. GI: Abdomen is soft. Tender at the right upper abdomen. No organomegaly. Vascular: Very minimal lower leg edema bilaterally.     Lab Results:  Lab Results  Component Value Date   WBC 9.1 05/01/2014   HGB 13.7 05/01/2014   HCT 38.8 05/01/2014   MCV 85.3 05/01/2014   PLT 280 05/01/2014   NEUTROABS 7.0* 05/01/2014    Imaging:  No results found.  Medications: I have reviewed the patient's current medications.  Assessment/Plan: 1. History of recurrent venous thrombosis. He is maintained on indefinite Coumadin anticoagulation. 2. Hypertension. Followed at [facility]. 3. Peripheral vascular disease. Followed at [facility].    Disposition: Mr. Pereira appears stable. He will continue Coumadin anticoagulation [facility]. He will return for a followup visit in one year.  We discussed his weight loss. He reports the weight loss is intentional and has occurred with an exercise program and changing his eating habits.    Lonna Cobb ANP/GNP-BC   07/15/2014  12:08 PM

## 2014-07-16 ENCOUNTER — Telehealth: Payer: Self-pay | Admitting: Oncology

## 2014-07-16 NOTE — Telephone Encounter (Signed)
s.w. pt and advised on Sept 2016 appt....pt ok adn aware.....mailed pt appt sched/avs and letter

## 2014-07-24 ENCOUNTER — Other Ambulatory Visit (HOSPITAL_BASED_OUTPATIENT_CLINIC_OR_DEPARTMENT_OTHER): Payer: Medicare Other

## 2014-07-24 ENCOUNTER — Ambulatory Visit (HOSPITAL_BASED_OUTPATIENT_CLINIC_OR_DEPARTMENT_OTHER): Payer: Medicare Other | Admitting: Pharmacist

## 2014-07-24 DIAGNOSIS — I82409 Acute embolism and thrombosis of unspecified deep veins of unspecified lower extremity: Secondary | ICD-10-CM

## 2014-07-24 LAB — CBC WITH DIFFERENTIAL/PLATELET
BASO%: 0.4 % (ref 0.0–2.0)
Basophils Absolute: 0 10*3/uL (ref 0.0–0.1)
EOS%: 3.1 % (ref 0.0–7.0)
Eosinophils Absolute: 0.3 10*3/uL (ref 0.0–0.5)
HEMATOCRIT: 34.2 % — AB (ref 38.4–49.9)
HGB: 12.3 g/dL — ABNORMAL LOW (ref 13.0–17.1)
LYMPH%: 11.3 % — AB (ref 14.0–49.0)
MCH: 31.1 pg (ref 27.2–33.4)
MCHC: 36 g/dL (ref 32.0–36.0)
MCV: 86.6 fL (ref 79.3–98.0)
MONO#: 0.6 10*3/uL (ref 0.1–0.9)
MONO%: 5.8 % (ref 0.0–14.0)
NEUT#: 8 10*3/uL — ABNORMAL HIGH (ref 1.5–6.5)
NEUT%: 79.4 % — AB (ref 39.0–75.0)
PLATELETS: 287 10*3/uL (ref 140–400)
RBC: 3.95 10*6/uL — ABNORMAL LOW (ref 4.20–5.82)
RDW: 15.1 % — ABNORMAL HIGH (ref 11.0–14.6)
WBC: 10.1 10*3/uL (ref 4.0–10.3)
lymph#: 1.1 10*3/uL (ref 0.9–3.3)
nRBC: 0 % (ref 0–0)

## 2014-07-24 LAB — PROTIME-INR
INR: 3.1 (ref 2.00–3.50)
Protime: 37.2 Seconds — ABNORMAL HIGH (ref 10.6–13.4)

## 2014-07-24 LAB — POCT INR: INR: 3.1

## 2014-07-24 NOTE — Progress Notes (Signed)
INR back close to goal with reduced coumadin dose.  No med changes.  No bleeding/bruising.  Will continue coumadin 1.25mg  W/F and 2.5mg  other days.  Will check PT/INR in 3 weeks to ensure still at goal, then can increase time between monitoring.  Note-Tony Zhang INR had been at goal range on Coumadin 2.5mg  daily for > 6037yrs prior to last week.

## 2014-08-02 ENCOUNTER — Other Ambulatory Visit: Payer: Self-pay | Admitting: Oncology

## 2014-08-02 DIAGNOSIS — I82409 Acute embolism and thrombosis of unspecified deep veins of unspecified lower extremity: Secondary | ICD-10-CM

## 2014-08-14 ENCOUNTER — Ambulatory Visit (HOSPITAL_BASED_OUTPATIENT_CLINIC_OR_DEPARTMENT_OTHER): Payer: Self-pay | Admitting: Pharmacist

## 2014-08-14 ENCOUNTER — Other Ambulatory Visit (HOSPITAL_BASED_OUTPATIENT_CLINIC_OR_DEPARTMENT_OTHER): Payer: Medicare Other

## 2014-08-14 DIAGNOSIS — I82409 Acute embolism and thrombosis of unspecified deep veins of unspecified lower extremity: Secondary | ICD-10-CM

## 2014-08-14 LAB — PROTIME-INR
INR: 2.7 (ref 2.00–3.50)
Protime: 32.4 Seconds — ABNORMAL HIGH (ref 10.6–13.4)

## 2014-08-14 LAB — POCT INR: INR: 2.7

## 2014-08-14 NOTE — Progress Notes (Signed)
INR = 2.7 on Coumadin 2.5 mg daily except 1.25 mg Wed/Fri. No missed doses. No med changes recently. INR back at goal.  Continue this dose above. Recheck in 5 weeks. Ebony HailGinna Alianis Trimmer, Pharm.D., CPP 08/14/2014@11 :11 AM

## 2014-09-02 ENCOUNTER — Other Ambulatory Visit: Payer: Self-pay | Admitting: Cardiovascular Disease

## 2014-09-02 NOTE — Telephone Encounter (Signed)
Rx has been sent to the pharmacy electronically. ° °

## 2014-09-11 ENCOUNTER — Other Ambulatory Visit: Payer: Self-pay | Admitting: Cardiovascular Disease

## 2014-09-18 ENCOUNTER — Other Ambulatory Visit (HOSPITAL_BASED_OUTPATIENT_CLINIC_OR_DEPARTMENT_OTHER): Payer: Medicare Other

## 2014-09-18 ENCOUNTER — Ambulatory Visit (HOSPITAL_BASED_OUTPATIENT_CLINIC_OR_DEPARTMENT_OTHER): Payer: Self-pay | Admitting: Pharmacist

## 2014-09-18 DIAGNOSIS — I82409 Acute embolism and thrombosis of unspecified deep veins of unspecified lower extremity: Secondary | ICD-10-CM

## 2014-09-18 LAB — POCT INR: INR: 2.7

## 2014-09-18 LAB — PROTIME-INR
INR: 2.7 (ref 2.00–3.50)
Protime: 32.4 Seconds — ABNORMAL HIGH (ref 10.6–13.4)

## 2014-09-18 NOTE — Progress Notes (Signed)
INR at goal today.  No med changes.  No bleeding or bruising.  [name] has been on current coumadin dose of 1.25mg  W/F and 2.5mg  other days for about 2.5 months.  Will continue this coumadin dose and check PT/INR in 2 months.  [name] knows to call for an appt sooner if any changes in medications or unusual bleeding/bruising.

## 2014-09-23 ENCOUNTER — Other Ambulatory Visit: Payer: Self-pay | Admitting: Cardiovascular Disease

## 2014-09-23 NOTE — Telephone Encounter (Signed)
Rx has been sent to the pharmacy electronically. ° °

## 2014-09-25 ENCOUNTER — Telehealth: Payer: Self-pay | Admitting: Cardiovascular Disease

## 2014-09-25 NOTE — Telephone Encounter (Signed)
Returned call to RungeElizabeth with [facility] Dr.Croitoru is aware of possible interaction he wants pt to continue both.Lab will be checked at his next office visit.

## 2014-09-25 NOTE — Telephone Encounter (Signed)
Alyssa was calling stating that the pt is taking both Benazepril and Losartan, and she would like to know should he be taking both of these meds. Please call  thanks

## 2014-09-25 NOTE — Telephone Encounter (Signed)
Aware about the possible interaction, would like him to receive it nonetheless. Has been on this combination for a long time. We'll check labs at his next appointment.

## 2014-09-25 NOTE — Telephone Encounter (Signed)
[facility]She stated she wanted to let Dr.Croitoru know when she refilled losartan/hctz 100/25 mg and benazepril 40 mg a interaction came up for patient not to take both could cause kidney problem and high potassium.Message sent to Dr.Croitoru for advice.Follow up appointment scheduled with Dr.Croitoru 11/14/14 at 9:30 am.

## 2014-09-30 ENCOUNTER — Other Ambulatory Visit: Payer: Self-pay | Admitting: Cardiovascular Disease

## 2014-09-30 NOTE — Telephone Encounter (Signed)
Rx was sent to pharmacy electronically. OV 11/14/14

## 2014-10-08 ENCOUNTER — Other Ambulatory Visit: Payer: Self-pay | Admitting: Cardiovascular Disease

## 2014-10-08 NOTE — Telephone Encounter (Signed)
Rx(s) sent to pharmacy electronically.  

## 2014-10-10 ENCOUNTER — Inpatient Hospital Stay (HOSPITAL_COMMUNITY)
Admission: EM | Admit: 2014-10-10 | Discharge: 2014-10-13 | DRG: 195 | Disposition: A | Discharge status: Home / Self Care | Payer: Medicare Other | Attending: Internal Medicine | Admitting: Internal Medicine

## 2014-10-10 ENCOUNTER — Encounter (HOSPITAL_COMMUNITY): Payer: Self-pay | Admitting: *Deleted

## 2014-10-10 ENCOUNTER — Emergency Department (HOSPITAL_COMMUNITY): Payer: Medicare Other

## 2014-10-10 DIAGNOSIS — M1990 Unspecified osteoarthritis, unspecified site: Secondary | ICD-10-CM | POA: Diagnosis present

## 2014-10-10 DIAGNOSIS — I82409 Acute embolism and thrombosis of unspecified deep veins of unspecified lower extremity: Secondary | ICD-10-CM | POA: Diagnosis present

## 2014-10-10 DIAGNOSIS — R791 Abnormal coagulation profile: Secondary | ICD-10-CM | POA: Diagnosis present

## 2014-10-10 DIAGNOSIS — Z86718 Personal history of other venous thrombosis and embolism: Secondary | ICD-10-CM | POA: Diagnosis not present

## 2014-10-10 DIAGNOSIS — M109 Gout, unspecified: Secondary | ICD-10-CM | POA: Diagnosis present

## 2014-10-10 DIAGNOSIS — E785 Hyperlipidemia, unspecified: Secondary | ICD-10-CM | POA: Diagnosis present

## 2014-10-10 DIAGNOSIS — J449 Chronic obstructive pulmonary disease, unspecified: Secondary | ICD-10-CM | POA: Diagnosis present

## 2014-10-10 DIAGNOSIS — Z885 Allergy status to narcotic agent status: Secondary | ICD-10-CM

## 2014-10-10 DIAGNOSIS — E876 Hypokalemia: Secondary | ICD-10-CM | POA: Diagnosis present

## 2014-10-10 DIAGNOSIS — J189 Pneumonia, unspecified organism: Principal | ICD-10-CM | POA: Diagnosis present

## 2014-10-10 DIAGNOSIS — Z87891 Personal history of nicotine dependence: Secondary | ICD-10-CM | POA: Diagnosis not present

## 2014-10-10 DIAGNOSIS — I1310 Hypertensive heart and chronic kidney disease without heart failure, with stage 1 through stage 4 chronic kidney disease, or unspecified chronic kidney disease: Secondary | ICD-10-CM | POA: Diagnosis present

## 2014-10-10 DIAGNOSIS — Z888 Allergy status to other drugs, medicaments and biological substances status: Secondary | ICD-10-CM

## 2014-10-10 DIAGNOSIS — N189 Chronic kidney disease, unspecified: Secondary | ICD-10-CM | POA: Diagnosis present

## 2014-10-10 DIAGNOSIS — Z7901 Long term (current) use of anticoagulants: Secondary | ICD-10-CM | POA: Diagnosis not present

## 2014-10-10 DIAGNOSIS — D649 Anemia, unspecified: Secondary | ICD-10-CM | POA: Diagnosis present

## 2014-10-10 DIAGNOSIS — D509 Iron deficiency anemia, unspecified: Secondary | ICD-10-CM | POA: Diagnosis present

## 2014-10-10 DIAGNOSIS — I739 Peripheral vascular disease, unspecified: Secondary | ICD-10-CM | POA: Diagnosis present

## 2014-10-10 DIAGNOSIS — R0602 Shortness of breath: Secondary | ICD-10-CM | POA: Diagnosis not present

## 2014-10-10 DIAGNOSIS — Z9049 Acquired absence of other specified parts of digestive tract: Secondary | ICD-10-CM | POA: Diagnosis present

## 2014-10-10 DIAGNOSIS — Z5181 Encounter for therapeutic drug level monitoring: Secondary | ICD-10-CM

## 2014-10-10 DIAGNOSIS — J441 Chronic obstructive pulmonary disease with (acute) exacerbation: Secondary | ICD-10-CM | POA: Diagnosis present

## 2014-10-10 LAB — BASIC METABOLIC PANEL
ANION GAP: 13 (ref 5–15)
Anion gap: 11 (ref 5–15)
BUN: 21 mg/dL (ref 6–23)
BUN: 20 mg/dL (ref 6–23)
CALCIUM: 8.5 mg/dL (ref 8.4–10.5)
CHLORIDE: 103 meq/L (ref 96–112)
CO2: 20 mmol/L (ref 19–32)
CO2: 18 mmol/L — ABNORMAL LOW (ref 19–32)
Calcium: 8.2 mg/dL — ABNORMAL LOW (ref 8.4–10.5)
Chloride: 106 mEq/L (ref 96–112)
Creatinine, Ser: 1.36 mg/dL — ABNORMAL HIGH (ref 0.50–1.35)
Creatinine, Ser: 1.37 mg/dL — ABNORMAL HIGH (ref 0.50–1.35)
GFR calc non Af Amer: 51 mL/min — ABNORMAL LOW (ref >90)
GFR, EST AFRICAN AMERICAN: 60 mL/min — AB (ref >90)
GFR, EST AFRICAN AMERICAN: 59 mL/min — AB (ref >90)
GFR, EST NON AFRICAN AMERICAN: 52 mL/min — AB (ref >90)
GLUCOSE: 178 mg/dL — AB (ref 70–99)
Glucose, Bld: 218 mg/dL — ABNORMAL HIGH (ref 70–99)
POTASSIUM: 3.6 mmol/L (ref 3.5–5.1)
POTASSIUM: 3.1 mmol/L — AB (ref 3.5–5.1)
Sodium: 137 mmol/L (ref 135–145)
Sodium: 134 mmol/L — ABNORMAL LOW (ref 135–145)

## 2014-10-10 LAB — CBC
HCT: 36.5 % — ABNORMAL LOW (ref 39.0–52.0)
HCT: 33.9 % — ABNORMAL LOW (ref 39.0–52.0)
HEMOGLOBIN: 11.4 g/dL — AB (ref 13.0–17.0)
Hemoglobin: 12.1 g/dL — ABNORMAL LOW (ref 13.0–17.0)
MCH: 28.2 pg (ref 26.0–34.0)
MCH: 28.5 pg (ref 26.0–34.0)
MCHC: 33.2 g/dL (ref 30.0–36.0)
MCHC: 33.6 g/dL (ref 30.0–36.0)
MCV: 85.1 fL (ref 78.0–100.0)
MCV: 84.8 fL (ref 78.0–100.0)
PLATELETS: 372 10*3/uL (ref 150–400)
PLATELETS: 380 10*3/uL (ref 150–400)
RBC: 4.29 MIL/uL (ref 4.22–5.81)
RBC: 4 MIL/uL — AB (ref 4.22–5.81)
RDW: 15 % (ref 11.5–15.5)
RDW: 15 % (ref 11.5–15.5)
WBC: 15.5 10*3/uL — ABNORMAL HIGH (ref 4.0–10.5)
WBC: 14.5 10*3/uL — AB (ref 4.0–10.5)

## 2014-10-10 LAB — PROTIME-INR
INR: 5.2 (ref 0.00–1.49)
INR: 4.91 — AB (ref 0.00–1.49)
Prothrombin Time: 48.2 seconds — ABNORMAL HIGH (ref 11.6–15.2)
Prothrombin Time: 46.1 seconds — ABNORMAL HIGH (ref 11.6–15.2)

## 2014-10-10 LAB — STREP PNEUMONIAE URINARY ANTIGEN: STREP PNEUMO URINARY ANTIGEN: NEGATIVE

## 2014-10-10 LAB — I-STAT TROPONIN, ED: TROPONIN I, POC: 0 ng/mL (ref 0.00–0.08)

## 2014-10-10 LAB — BRAIN NATRIURETIC PEPTIDE: B Natriuretic Peptide: 426.9 pg/mL — ABNORMAL HIGH (ref 0.0–100.0)

## 2014-10-10 MED ORDER — PNEUMOCOCCAL VAC POLYVALENT 25 MCG/0.5ML IJ INJ
0.5000 mL | INJECTION | INTRAMUSCULAR | Status: AC
  Administered 2014-10-11: 0.5 mL via INTRAMUSCULAR
  Filled 2014-10-10: qty 0.5

## 2014-10-10 MED ORDER — PREDNISONE 50 MG PO TABS
60.0000 mg | ORAL_TABLET | Freq: Every day | ORAL | Status: DC
  Administered 2014-10-11: 60 mg via ORAL
  Filled 2014-10-10 (×3): qty 1

## 2014-10-10 MED ORDER — WARFARIN - PHARMACIST DOSING INPATIENT: Freq: Every day | Status: DC

## 2014-10-10 MED ORDER — HYDROCHLOROTHIAZIDE 25 MG PO TABS
25.0000 mg | ORAL_TABLET | Freq: Every day | ORAL | Status: DC
  Administered 2014-10-10: 25 mg via ORAL
  Filled 2014-10-10 (×2): qty 1

## 2014-10-10 MED ORDER — CILOSTAZOL 100 MG PO TABS
100.0000 mg | ORAL_TABLET | Freq: Two times a day (BID) | ORAL | Status: DC
  Administered 2014-10-10 – 2014-10-13 (×7): 100 mg via ORAL
  Filled 2014-10-10 (×8): qty 1

## 2014-10-10 MED ORDER — FENOFIBRATE 160 MG PO TABS
160.0000 mg | ORAL_TABLET | Freq: Every day | ORAL | Status: DC
  Administered 2014-10-10 – 2014-10-13 (×4): 160 mg via ORAL
  Filled 2014-10-10 (×4): qty 1

## 2014-10-10 MED ORDER — SODIUM CHLORIDE 0.9 % IV SOLN: 250.0000 mL | INTRAVENOUS | Status: DC | PRN

## 2014-10-10 MED ORDER — ALBUTEROL (5 MG/ML) CONTINUOUS INHALATION SOLN
10.0000 mg/h | INHALATION_SOLUTION | RESPIRATORY_TRACT | Status: DC
  Administered 2014-10-10: 10 mg/h via RESPIRATORY_TRACT

## 2014-10-10 MED ORDER — NEBIVOLOL HCL 10 MG PO TABS
10.0000 mg | ORAL_TABLET | Freq: Two times a day (BID) | ORAL | Status: DC
  Administered 2014-10-10 – 2014-10-13 (×7): 10 mg via ORAL
  Filled 2014-10-10 (×8): qty 1

## 2014-10-10 MED ORDER — PIPERACILLIN-TAZOBACTAM 3.375 G IVPB
3.3750 g | Freq: Three times a day (TID) | INTRAVENOUS | Status: DC
Start: 2014-10-10 — End: 2014-10-13
  Administered 2014-10-10 – 2014-10-13 (×9): 3.375 g via INTRAVENOUS
  Filled 2014-10-10 (×11): qty 50

## 2014-10-10 MED ORDER — IPRATROPIUM BROMIDE 0.02 % IN SOLN
0.5000 mg | Freq: Once | RESPIRATORY_TRACT | Status: AC
  Administered 2014-10-10: 0.5 mg via RESPIRATORY_TRACT

## 2014-10-10 MED ORDER — IPRATROPIUM-ALBUTEROL 0.5-2.5 (3) MG/3ML IN SOLN
3.0000 mL | RESPIRATORY_TRACT | Status: DC
  Administered 2014-10-10 (×4): 3 mL via RESPIRATORY_TRACT
  Filled 2014-10-10 (×4): qty 3

## 2014-10-10 MED ORDER — PIPERACILLIN-TAZOBACTAM 3.375 G IVPB 30 MIN
3.3750 g | Freq: Once | INTRAVENOUS | Status: AC
  Administered 2014-10-10: 3.375 g via INTRAVENOUS
  Filled 2014-10-10: qty 50

## 2014-10-10 MED ORDER — MAGNESIUM SULFATE 2 GM/50ML IV SOLN
2.0000 g | Freq: Once | INTRAVENOUS | Status: AC
  Administered 2014-10-10: 2 g via INTRAVENOUS
  Filled 2014-10-10: qty 50

## 2014-10-10 MED ORDER — SODIUM CHLORIDE 0.9 % IV SOLN
1500.0000 mg | Freq: Once | INTRAVENOUS | Status: AC
  Administered 2014-10-10: 1500 mg via INTRAVENOUS
  Filled 2014-10-10: qty 1500

## 2014-10-10 MED ORDER — ACETAMINOPHEN 650 MG RE SUPP: 650.0000 mg | Freq: Four times a day (QID) | RECTAL | Status: DC | PRN

## 2014-10-10 MED ORDER — SODIUM CHLORIDE 0.9 % IV BOLUS (SEPSIS): 1000.0000 mL | Freq: Once | INTRAVENOUS | Status: DC

## 2014-10-10 MED ORDER — OXYCODONE HCL 5 MG PO TABS
5.0000 mg | ORAL_TABLET | ORAL | Status: DC | PRN
  Administered 2014-10-10 – 2014-10-13 (×17): 5 mg via ORAL
  Filled 2014-10-10 (×19): qty 1

## 2014-10-10 MED ORDER — LOSARTAN POTASSIUM 50 MG PO TABS
100.0000 mg | ORAL_TABLET | Freq: Every day | ORAL | Status: DC
  Administered 2014-10-10: 100 mg via ORAL
  Filled 2014-10-10 (×2): qty 2

## 2014-10-10 MED ORDER — LOSARTAN POTASSIUM-HCTZ 100-25 MG PO TABS: 1.0000 | ORAL_TABLET | Freq: Every day | ORAL | Status: DC

## 2014-10-10 MED ORDER — IPRATROPIUM-ALBUTEROL 0.5-2.5 (3) MG/3ML IN SOLN
3.0000 mL | Freq: Four times a day (QID) | RESPIRATORY_TRACT | Status: DC
  Administered 2014-10-11 – 2014-10-13 (×9): 3 mL via RESPIRATORY_TRACT
  Filled 2014-10-10 (×9): qty 3

## 2014-10-10 MED ORDER — ALUM & MAG HYDROXIDE-SIMETH 200-200-20 MG/5ML PO SUSP: 30.0000 mL | Freq: Four times a day (QID) | ORAL | Status: DC | PRN

## 2014-10-10 MED ORDER — ALBUTEROL (5 MG/ML) CONTINUOUS INHALATION SOLN
INHALATION_SOLUTION | RESPIRATORY_TRACT | Status: AC
Start: 2014-10-10 — End: 2014-10-10
  Filled 2014-10-10: qty 20

## 2014-10-10 MED ORDER — SODIUM CHLORIDE 0.9 % IJ SOLN
3.0000 mL | Freq: Two times a day (BID) | INTRAMUSCULAR | Status: DC
  Administered 2014-10-10 – 2014-10-11 (×2): 3 mL via INTRAVENOUS

## 2014-10-10 MED ORDER — PREDNISONE 20 MG PO TABS
60.0000 mg | ORAL_TABLET | Freq: Once | ORAL | Status: AC
  Administered 2014-10-10: 60 mg via ORAL
  Filled 2014-10-10: qty 3

## 2014-10-10 MED ORDER — AMLODIPINE BESYLATE 2.5 MG PO TABS
2.5000 mg | ORAL_TABLET | Freq: Every day | ORAL | Status: DC
  Administered 2014-10-10 – 2014-10-13 (×4): 2.5 mg via ORAL
  Filled 2014-10-10 (×4): qty 1

## 2014-10-10 MED ORDER — LEVOFLOXACIN IN D5W 750 MG/150ML IV SOLN: 750.0000 mg | Freq: Once | INTRAVENOUS | Status: DC

## 2014-10-10 MED ORDER — FUROSEMIDE 10 MG/ML IJ SOLN
40.0000 mg | Freq: Once | INTRAMUSCULAR | Status: AC
  Administered 2014-10-10: 40 mg via INTRAVENOUS
  Filled 2014-10-10: qty 4

## 2014-10-10 MED ORDER — ONDANSETRON HCL 4 MG PO TABS: 4.0000 mg | ORAL_TABLET | Freq: Four times a day (QID) | ORAL | Status: DC | PRN

## 2014-10-10 MED ORDER — SODIUM CHLORIDE 0.9 % IJ SOLN: 3.0000 mL | INTRAMUSCULAR | Status: DC | PRN

## 2014-10-10 MED ORDER — SIMVASTATIN 40 MG PO TABS
40.0000 mg | ORAL_TABLET | Freq: Every day | ORAL | Status: DC
  Administered 2014-10-10 – 2014-10-12 (×3): 40 mg via ORAL
  Filled 2014-10-10 (×4): qty 1

## 2014-10-10 MED ORDER — IPRATROPIUM BROMIDE 0.02 % IN SOLN
RESPIRATORY_TRACT | Status: AC
  Filled 2014-10-10: qty 2.5

## 2014-10-10 MED ORDER — HYDROMORPHONE HCL 1 MG/ML IJ SOLN: 0.5000 mg | INTRAMUSCULAR | Status: DC | PRN

## 2014-10-10 MED ORDER — ONDANSETRON HCL 4 MG/2ML IJ SOLN: 4.0000 mg | Freq: Four times a day (QID) | INTRAMUSCULAR | Status: DC | PRN

## 2014-10-10 MED ORDER — ACETAMINOPHEN 325 MG PO TABS: 650.0000 mg | ORAL_TABLET | Freq: Four times a day (QID) | ORAL | Status: DC | PRN

## 2014-10-10 MED ORDER — SODIUM CHLORIDE 0.9 % IJ SOLN
3.0000 mL | Freq: Two times a day (BID) | INTRAMUSCULAR | Status: DC
  Administered 2014-10-10 – 2014-10-12 (×4): 3 mL via INTRAVENOUS

## 2014-10-10 MED ORDER — VANCOMYCIN HCL IN DEXTROSE 750-5 MG/150ML-% IV SOLN
750.0000 mg | Freq: Two times a day (BID) | INTRAVENOUS | Status: DC
  Administered 2014-10-10 – 2014-10-12 (×4): 750 mg via INTRAVENOUS
  Filled 2014-10-10 (×4): qty 150

## 2014-10-10 NOTE — H&P (Signed)
[facility] Admission History and Physical       Tony MinorsJerry F Almon ZOX:[id number] DOB: Dec 11, 1945 DOA: 10/10/2014  Referring physician: EDP PCP: [name],[name], MD  Specialists:   Chief Complaint: SOB   HPI: Tony Zhang is a 68 y.o. male with a history of COPD, HTN, Hx of DVT on Coumadin Rx who presents to the ED with complaints of worsening SOB and  chest tightness and wheezing for the past 3 days.   He had been placed on antibiotics as an outpatient, but did not improve.  He was found to have decreased O2 sats of 88%.  He was found to have a RLL Pneumonia and was placed on placed on IV Vancomycin and Zosyn and referred for medical admission.      Review of Systems:  Constitutional: No Weight Loss, No Weight Gain, Night Sweats, Fevers, Chills, Dizziness, Fatigue, or Generalized Weakness HEENT: No Headaches, Difficulty Swallowing,Tooth/Dental Problems,Sore Throat,  No Sneezing, Rhinitis, Ear Ache, Nasal Congestion, or Post Nasal Drip,  Cardio-vascular:  No Chest pain, Orthopnea, PND, Edema in Lower Extremities, Anasarca, Dizziness, Palpitations  Resp: +Dyspnea, No DOE, +Productive Cough, No Non-Productive Cough, No Hemoptysis, +Wheezing.    GI: No Heartburn, Indigestion, Abdominal Pain, Nausea, Vomiting, Diarrhea, Hematemesis, Hematochezia, Melena, Change in Bowel Habits,  Loss of Appetite  GU: No Dysuria, Change in Color of Urine, No Urgency or Frequency, No Flank pain.  Musculoskeletal: No Joint Pain or Swelling, No Decreased Range of Motion, No Back Pain.  Neurologic: No Syncope, No Seizures, Muscle Weakness, Paresthesia, Vision Disturbance or Loss, No Diplopia, No Vertigo, No Difficulty Walking,  Skin: No Rash or Lesions. Psych: No Change in Mood or Affect, No Depression or Anxiety, No Memory loss, No Confusion, or Hallucinations   Past Medical History  Diagnosis Date  . Venous thrombosis     Recurrent  . Peripheral vascular disease   . Dyslipidemia   . AAA (abdominal  aortic aneurysm)   . COPD (chronic obstructive pulmonary disease)   . Gout   . DJD (degenerative joint disease)   . Malignant hypertension   . Pneumonia 08/2013  . Shortness of breath       Past Surgical History  Procedure Laterality Date  . Back surgery    . Hernia repair    . Appendectomy    . Shoulder surgery Right        Prior to Admission medications   Medication Sig Start Date End Date Taking? Authorizing Provider  amLODipine (NORVASC) 2.5 MG tablet Take 1 tablet (2.5 mg total) by mouth daily. Needs appointment before anymore refills 09/11/14  Yes Mihai Croitoru, MD  azithromycin (ZITHROMAX) 500 MG tablet Take 500 mg by mouth daily. 10/08/14  Yes Historical Provider, MD  benazepril (LOTENSIN) 40 MG tablet Take 1 tablet (40 mg total) by mouth daily. Needs appointment before anymore refills 09/23/14  Yes Mihai Croitoru, MD  BYSTOLIC 10 MG tablet TAKE ONE TABLET BY MOUTH TWICE DAILY 07/08/14  Yes Mihai Croitoru, MD  cilostazol (PLETAL) 100 MG tablet Take 1 tablet (100 mg total) by mouth 2 (two) times daily. 02/04/14  Yes Mihai Croitoru, MD  fenofibrate 160 MG tablet Take 1 tablet (160 mg total) by mouth daily. 09/30/14  Yes Mihai Croitoru, MD  HYDROcodone-acetaminophen (NORCO) 10-325 MG per tablet Take 1 tablet by mouth every 6 (six) hours as needed for moderate pain.  09/19/14  Yes Historical Provider, MD  ibuprofen (ADVIL,MOTRIN) 800 MG tablet 1 po bid prn Patient taking differently: Take 800 mg by  mouth 2 (two) times daily as needed.  09/28/11  Yes Ladene Artist, MD  losartan-hydrochlorothiazide (HYZAAR) 100-25 MG per tablet Take 1 tablet by mouth daily. 08/07/13  Yes Mihai Croitoru, MD  simvastatin (ZOCOR) 40 MG tablet Take 1 tablet (40 mg total) by mouth daily at 6 PM. MUST KEEP APPOINTMENT FOR FUTURE REFILLS. 10/08/14  Yes Mihai Croitoru, MD  warfarin (COUMADIN) 2.5 MG tablet Take 1.25-2.5 mg by mouth daily at 6 PM. Takes 1.25mg  on Wed and Fri Takes 2.5mg  all other days    Yes Historical Provider, MD  carvedilol (COREG) 12.5 MG tablet Take 1 tablet (12.5 mg total) by mouth 2 (two) times daily. 08/21/13   Thurmon Fair, MD      Allergies  Allergen Reactions  . Codeine Hives    Takes benadryl to stop reaction  . Amlodipine Other (See Comments)    Muscle weakness     Social History:  reports that he quit smoking about 21 years ago. He has never used smokeless tobacco. He reports that he does not drink alcohol or use illicit drugs.     History reviewed. No pertinent family history.     Physical Exam:  GEN:  Pleasant Disheveled  68 y.o. Caucasian male  examined  and in no acute distress; cooperative with exam Filed Vitals:   10/10/14 0156 10/10/14 0200 10/10/14 0221 10/10/14 0345  BP: 184/95 154/82 202/106 195/99  Pulse: 108 106 104 113  Temp: 98.3 F (36.8 C)     TempSrc: Oral     Resp: 22 26 28 29   Height: 6\' 1"  (1.854 m)     Weight: 103.42 kg (228 lb)     SpO2: 94% 94% 93% 89%   Blood pressure 195/99, pulse 113, temperature 98.3 F (36.8 C), temperature source Oral, resp. rate 29, height 6\' 1"  (1.854 m), weight 103.42 kg (228 lb), SpO2 89 %. PSYCH: He is alert and oriented x4; does not appear anxious does not appear depressed; affect is normal HEENT: Normocephalic and Atraumatic, Mucous membranes pink; PERRLA; EOM intact; Fundi:  Benign;  No scleral icterus, Nares: Patent, Oropharynx: Clear, Edentulous or Fair Dentition,    Neck:  FROM, No Cervical Lymphadenopathy nor Thyromegaly or Carotid Bruit; No JVD; Breasts:: Not examined CHEST WALL: No tenderness CHEST: Decreased Breath Sounds Bilaterally, NO Rales Rhonchi or Wheezes,  HEART: Regular rate and rhythm; no murmurs rubs or gallops BACK: No kyphosis or scoliosis; No CVA tenderness ABDOMEN: Positive Bowel Sounds,  Obese, Soft Non-Tender; No Masses, No Organomegaly. Rectal Exam: Not done EXTREMITIES: No Cyanosis, Clubbing, or Edema; No Ulcerations. Genitalia: not examined PULSES: 2+ and  symmetric SKIN: Normal hydration no rash or ulceration CNS:  Alert and Oriented x 4, No Focal Deficits Vascular: pulses palpable throughout    Labs on Admission:  Basic Metabolic Panel:  Recent Labs Lab 10/10/14 0159  NA 137  K 3.6  CL 106  CO2 20  GLUCOSE 178*  BUN 21  CREATININE 1.36*  CALCIUM 8.5   Liver Function Tests: No results for input(s): AST, ALT, ALKPHOS, BILITOT, PROT, ALBUMIN in the last 168 hours. No results for input(s): LIPASE, AMYLASE in the last 168 hours. No results for input(s): AMMONIA in the last 168 hours. CBC:  Recent Labs Lab 10/10/14 0159  WBC 15.5*  HGB 12.1*  HCT 36.5*  MCV 85.1  PLT 372   Cardiac Enzymes: No results for input(s): CKTOTAL, CKMB, CKMBINDEX, TROPONINI in the last 168 hours.  BNP (last 3 results) No results for  input(s): PROBNP in the last 8760 hours. CBG: No results for input(s): GLUCAP in the last 168 hours.  Radiological Exams on Admission: Dg Chest 2 View (if Patient Has Fever And/or Copd)  10/10/2014   CLINICAL DATA:  Acute onset of cough and shortness of breath. Initial encounter.  EXAM: CHEST  2 VIEW  COMPARISON:  Chest radiograph performed 09/09/2013  FINDINGS: The lungs are well-aerated. Prominent bibasilar airspace opacification is noted, right greater than left, with small to moderate right and small left pleural effusions. This may reflect multifocal pneumonia or possibly pulmonary edema. Underlying vascular congestion is noted. No pneumothorax is seen. There is no evidence of focal opacification, pleural effusion or pneumothorax.  The heart is borderline normal in size. No acute osseous abnormalities are seen.  IMPRESSION: Prominent bibasilar airspace opacification, right greater left, with small to moderate right and small left pleural effusions. This may reflect multifocal pneumonia or possibly pulmonary edema. Underlying vascular congestion noted.  Would treat for the acute process, then perform follow-up chest  radiograph to ensure resolution of right basilar airspace opacity.   Electronically Signed   By: Roanna RaiderJeffery  Chang M.D.   On: 10/10/2014 02:59     EKG: Independently reviewed. Sinus Tachycardia at 112   Assessment/Plan:   68 y.o. male with  Principal Problem:   1.    HCAP (healthcare-associated pneumonia)   IV Vanc  And Zosyn   DUONEBS   O2 PRn  ` Monitor O2 Sats   Active Problems:   2.    COPD (chronic obstructive pulmonary disease)   DUONebs   Steroid Taper   O2  PRN     3.    DVT (deep venous thrombosis)   On coumadin and ?Currently Supra-Therapeutic    Hold Coumdin    Monitor PT/INRs and Adjust PRN     4.    Malignant HTN with heart disease, w/o CHF, with chronic kidney disease   Monitor BPs   Continue Carvedilol, Losartan/HCTZ,          5.    Hyperlipidemia   Continue Simvastatin and Fenofibrate    6.    Normocytic anemia   Monitor H/Hs   Send Anemia Panel     7.    Peripheral vascular disease   No Changes     8.    Anticoagulated on Coumadin/Supratherapeutic INR   Hold Coumadin     Monitor PT/INRs daily    Adjust PRN     Code Status:    FULL CODE Family Communication:   No Family at Bedside  Disposition Plan:  Inpatient Stepdown       Time spent: 60 MInutes  Ron ParkerJENKINS,Latressa Harries C [facility] Pager [phone]  If 7AM -7PM Please Contact the Day Rounding Team MD for [facility]  If 7PM-7AM, Please Contact Night-Floor Coverage  [email or web address] Password [email or web address] 10/10/2014, 4:42 AM

## 2014-10-10 NOTE — Progress Notes (Addendum)
ANTICOAGULATION CONSULT NOTE - Initial Consult  Pharmacy Consult for warfarin Indication: DVT  Allergies  Allergen Reactions  . Codeine Hives    Takes benadryl to stop reaction  . Amlodipine Other (See Comments)    Muscle weakness    Patient Measurements: Height: 6\' 1"  (185.4 cm) Weight: 227 lb 1.6 oz (103.012 kg) IBW/kg (Calculated) : 79.9 Heparin Dosing Weight:   Vital Signs: Temp: 98.4 F (36.9 C) (12/24 0942) Temp Source: Oral (12/24 0942) BP: 135/66 mmHg (12/24 0942) Pulse Rate: 97 (12/24 0942)  Labs:  Recent Labs  10/10/14 0159 10/10/14 0530  HGB 12.1* 11.4*  HCT 36.5* 33.9*  PLT 372 380  LABPROT 48.2* 46.1*  INR 5.20* 4.91*  CREATININE 1.36* 1.37*    Estimated Creatinine Clearance: 64.1 mL/min (by C-G formula based on Cr of 1.37).   Medical History: Past Medical History  Diagnosis Date  . Venous thrombosis     Recurrent  . Peripheral vascular disease   . Dyslipidemia   . AAA (abdominal aortic aneurysm)   . COPD (chronic obstructive pulmonary disease)   . Gout   . DJD (degenerative joint disease)   . Malignant hypertension   . Pneumonia 08/2013  . Shortness of breath     Medications:  See EMR  Assessment: On warfarin prior to admission for hx of DVT. Admitted with supratherapeutic INR, currently at 4.9. No bleeding. CBC stable.  PTA dose: 1.25 mg po on Wed/Fri and 2.5 mg po on all other days  Goal of Therapy:  INR 2-3 Monitor platelets by anticoagulation protocol: Yes   Plan:  Hold warfarin Daily INR   Agapito GamesAlison Aarvi Stotts, PharmD, BCPS Clinical Pharmacist Pager: [phone] 10/10/2014 1:57 PM      ANTIBIOTIC CONSULT  -Concern for HCAP, was on Azithromycin for CAP as outpatient without improvement in symptoms -Legionella/strep pneumo ag pending -Rec'd vanc/zosyn in ED -Continue zosyn 3.375 g IV q8h -Continue vancomycin 750 mg IV q12h -Blood cx pending  Monitor renal fx, cultures, duration of therapy, will obtain VT as  indicated  Baldemar FridayMasters, Kingstin Heims M 10/10/2014 2:14 PM

## 2014-10-10 NOTE — ED Notes (Signed)
Per EMS: pt coming from home with c/o shortness of breath. Upon [facility] arrival pt was 88% on RA. EMS transport pt received 5 mg albuterol, and 0.5 Atrovent. Pt has wheezing in lower lobes. Pt's O2 sats 95% with neb tx.

## 2014-10-10 NOTE — Progress Notes (Signed)
Patient ID: Tony Zhang  male  ZOX:[id number]    DOB: July 26, 1946    DOA: 10/10/2014  PCP: [name],[name], MD  Brief history of present illness  Patient is a 68 year old male with COPD, hypertension, DVT on Coumadin, presented to ED with worsening shortness of breath, chest tightness and wheezing for the past 3 days. Patient was placed on antibiotics and an outpatient, did not improve. He was found to be hypoxic with decreased O2 sats of 88%. Chest x-ray showed right lower lung pneumonia and patient was placed on IV vancomycin and  Zosyn.  Assessment/Plan: Principal Problem:   HCAP (healthcare-associated pneumonia) - Continue IV vancomycin and Zosyn, duo nebs, O2, - Follow blood cultures, sputum culture, urine legionella antigen, urine strep dictation  Active Problems: DVT (deep venous thrombosis) on Coumadin, supratherapeutic INR - Continue Coumadin per pharmacy, INR is still supratherapeutic 4.9    Malignant HTN with heart disease, w/o CHF, with chronic kidney disease - BP stable, continue Coreg, losartan/HCTZ    Hyperlipidemia - Continue fenofibrate, simvastatin    COPD (chronic obstructive pulmonary disease) - Currently stable, duo nebs    Normocytic anemia - Monitor CBC   DVT Prophylaxis: INR supratherapeutic  Code Status:  Family Communication:  Disposition:  Consultants:  None  Procedures:  None  Antibiotics:  IV vancomycin  IV Zosyn  Subjective: Patient seen and examined, feeling significantly better from the admission  Objective: Weight change:   Intake/Output Summary (Last 24 hours) at 10/10/14 1303 Last data filed at 10/10/14 0900  Gross per 24 hour  Intake    820 ml  Output   2025 ml  Net  -1205 ml   Blood pressure 135/66, pulse 97, temperature 98.4 F (36.9 C), temperature source Oral, resp. rate 24, height 6\' 1"  (1.854 m), weight 103.012 kg (227 lb 1.6 oz), SpO2 91 %.  Physical Exam: General: Alert and awake, oriented x3, not in any  acute distress. CVS: S1-S2 clear, no murmur rubs or gallops Chest: Decreased breath sounds at the bases Abdomen: soft nontender, nondistended, normal bowel sounds  Extremities: no cyanosis, clubbing or edema noted bilaterally Neuro: Cranial nerves II-XII intact, no focal neurological deficits  Lab Results: Basic Metabolic Panel:  Recent Labs Lab 10/10/14 0159 10/10/14 0530  NA 137 134*  K 3.6 3.1*  CL 106 103  CO2 20 18*  GLUCOSE 178* 218*  BUN 21 20  CREATININE 1.36* 1.37*  CALCIUM 8.5 8.2*   Liver Function Tests: No results for input(s): AST, ALT, ALKPHOS, BILITOT, PROT, ALBUMIN in the last 168 hours. No results for input(s): LIPASE, AMYLASE in the last 168 hours. No results for input(s): AMMONIA in the last 168 hours. CBC:  Recent Labs Lab 10/10/14 0159 10/10/14 0530  WBC 15.5* 14.5*  HGB 12.1* 11.4*  HCT 36.5* 33.9*  MCV 85.1 84.8  PLT 372 380   Cardiac Enzymes: No results for input(s): CKTOTAL, CKMB, CKMBINDEX, TROPONINI in the last 168 hours. BNP: Invalid input(s): POCBNP CBG: No results for input(s): GLUCAP in the last 168 hours.   Micro Results: No results found for this or any previous visit (from the past 240 hour(s)).  Studies/Results: Dg Chest 2 View (if Patient Has Fever And/or Copd)  10/10/2014   CLINICAL DATA:  Acute onset of cough and shortness of breath. Initial encounter.  EXAM: CHEST  2 VIEW  COMPARISON:  Chest radiograph performed 09/09/2013  FINDINGS: The lungs are well-aerated. Prominent bibasilar airspace opacification is noted, right greater than left, with small to moderate  right and small left pleural effusions. This may reflect multifocal pneumonia or possibly pulmonary edema. Underlying vascular congestion is noted. No pneumothorax is seen. There is no evidence of focal opacification, pleural effusion or pneumothorax.  The heart is borderline normal in size. No acute osseous abnormalities are seen.  IMPRESSION: Prominent bibasilar  airspace opacification, right greater left, with small to moderate right and small left pleural effusions. This may reflect multifocal pneumonia or possibly pulmonary edema. Underlying vascular congestion noted.  Would treat for the acute process, then perform follow-up chest radiograph to ensure resolution of right basilar airspace opacity.   Electronically Signed   By: Roanna RaiderJeffery  Chang M.D.   On: 10/10/2014 02:59    Medications: Scheduled Meds: . amLODipine  2.5 mg Oral Daily  . cilostazol  100 mg Oral BID  . fenofibrate  160 mg Oral Daily  . losartan  100 mg Oral Daily   And  . hydrochlorothiazide  25 mg Oral Daily  . ipratropium-albuterol  3 mL Nebulization Q4H  . nebivolol  10 mg Oral BID  . [START ON 10/11/2014] pneumococcal 23 valent vaccine  0.5 mL Intramuscular Tomorrow-1000  . [START ON 10/11/2014] predniSONE  60 mg Oral Q breakfast  . simvastatin  40 mg Oral q1800  . sodium chloride  3 mL Intravenous Q12H  . sodium chloride  3 mL Intravenous Q12H      LOS: 0 days   Ephraim Reichel M.D. [facility] 10/10/2014, 1:03 PM Pager: [phone]  If 7PM-7AM, please contact night-coverage [email or web address] Password [email or web address]

## 2014-10-10 NOTE — ED Provider Notes (Signed)
CSN: [id number]     Arrival date & time 10/10/14  0146 History  This chart was scribed for Tomasita CrumbleAdeleke Jaydis Duchene, MD by Evon Slackerrance Branch, ED Scribe. This patient was seen in room [facility] and the patient's care was started at 2:04 AM.    Chief Complaint  Patient presents with  . Shortness of Breath    Patient is a 68 y.o. male presenting with shortness of breath. The history is provided by the patient and the EMS personnel. No language interpreter was used.  Shortness of Breath Associated symptoms: cough and fever   Associated symptoms: no chest pain    HPI Comments: Tony MinorsJerry F Zhang is a 68 y.o. malewith PMHx DVT's, COPD, pneumonia and shortness of breath brought in by ambulance, who presents to the Emergency Department complaining of SOB onset 4 days prior. Pt states that his SOB recently worsened today PTA. Pt states he has associated fever and non-productive cough. Pt states that he has rib pain with movement. Ems states his O2 sats was at 88% on arrival that has improved after receiving a breathing treatment. Pt states that he feels as if the breathing treatment didn't provide any relief. Pt states that he believes he may have pneumonia. Pt states that he took one antibiotic that was prescribed by his PCP. Denies chest pain.   Past Medical History  Diagnosis Date  . Venous thrombosis     Recurrent  . Peripheral vascular disease   . Dyslipidemia   . AAA (abdominal aortic aneurysm)   . COPD (chronic obstructive pulmonary disease)   . Gout   . DJD (degenerative joint disease)   . Malignant hypertension   . Pneumonia 08/2013  . Shortness of breath    Past Surgical History  Procedure Laterality Date  . Back surgery    . Hernia repair    . Appendectomy    . Shoulder surgery Right    History reviewed. No pertinent family history. History  Substance Use Topics  . Smoking status: Former Smoker    Quit date: 10/18/1992  . Smokeless tobacco: Never Used  . Alcohol Use: No    Review of  Systems  Constitutional: Positive for fever.  Respiratory: Positive for cough and shortness of breath.   Cardiovascular: Negative for chest pain.  All other systems reviewed and are negative.    Allergies  Amlodipine and Codeine  Home Medications   Prior to Admission medications   Medication Sig Start Date End Date Taking? Authorizing Provider  amLODipine (NORVASC) 2.5 MG tablet Take 1 tablet (2.5 mg total) by mouth daily. Needs appointment before anymore refills 09/11/14   Mihai Croitoru, MD  benazepril (LOTENSIN) 40 MG tablet Take 1 tablet (40 mg total) by mouth daily. Needs appointment before anymore refills 09/23/14   Mihai Croitoru, MD  BYSTOLIC 10 MG tablet TAKE ONE TABLET BY MOUTH TWICE DAILY 07/08/14   Mihai Croitoru, MD  carvedilol (COREG) 12.5 MG tablet Take 1 tablet (12.5 mg total) by mouth 2 (two) times daily. 08/21/13   Mihai Croitoru, MD  cilostazol (PLETAL) 100 MG tablet Take 1 tablet (100 mg total) by mouth 2 (two) times daily. 02/04/14   Mihai Croitoru, MD  fenofibrate 160 MG tablet Take 1 tablet (160 mg total) by mouth daily. 09/30/14   Mihai Croitoru, MD  ibuprofen (ADVIL,MOTRIN) 800 MG tablet 1 po bid prn 09/28/11   Ladene ArtistGary B Sherrill, MD  losartan-hydrochlorothiazide (HYZAAR) 100-25 MG per tablet Take 1 tablet by mouth daily. 08/07/13   Thurmon FairMihai Croitoru, MD  simvastatin (ZOCOR) 40 MG tablet Take 1 tablet (40 mg total) by mouth daily at 6 PM. MUST KEEP APPOINTMENT FOR FUTURE REFILLS. 10/08/14   Mihai Croitoru, MD  warfarin (COUMADIN) 2.5 MG tablet TAKE 1 TABLET BY MOUTH DAILY OR AS DIRECTED 08/02/14   Ladene Artist, MD   Triage Vitals: BP 184/95 mmHg  Pulse 108  Temp(Src) 98.3 F (36.8 C) (Oral)  Resp 22  Ht 6\' 1"  (1.854 m)  Wt 228 lb (103.42 kg)  BMI 30.09 kg/m2  SpO2 94%   Physical Exam  Constitutional: He is oriented to person, place, and time. He appears well-developed and well-nourished. No distress.  HENT:  Head: Normocephalic and atraumatic.  Eyes:  Conjunctivae and EOM are normal.  Neck: Neck supple. No tracheal deviation present.  Cardiovascular: Tachycardia present.   Pulmonary/Chest: Tachypnea noted. No respiratory distress. He has decreased breath sounds. He has wheezes.  Decreased breath sounds through out , expiatory wheezing in both fields.  Musculoskeletal: Normal range of motion.  Neurological: He is alert and oriented to person, place, and time.  Skin: Skin is warm and dry. Rash noted.  Bilateral upper extremities 2 cm circumferential rash in multiple areas.   Psychiatric: He has a normal mood and affect. His behavior is normal.  Nursing note and vitals reviewed.   ED Course  Procedures (including critical care time) DIAGNOSTIC STUDIES: Oxygen Saturation is 94% on [place], adequate by my interpretation.    COORDINATION OF CARE: 2:15 AM-Discussed treatment plan  with pt at bedside and pt agreed to plan.     Labs Review Labs Reviewed  BASIC METABOLIC PANEL - Abnormal; Notable for the following:    Glucose, Bld 178 (*)    Creatinine, Ser 1.36 (*)    GFR calc non Af Amer 52 (*)    GFR calc Af Amer 60 (*)    All other components within normal limits  CBC - Abnormal; Notable for the following:    WBC 15.5 (*)    Hemoglobin 12.1 (*)    HCT 36.5 (*)    All other components within normal limits  PROTIME-INR - Abnormal; Notable for the following:    Prothrombin Time 48.2 (*)    INR 5.20 (*)    All other components within normal limits  BRAIN NATRIURETIC PEPTIDE - Abnormal; Notable for the following:    B Natriuretic Peptide 426.9 (*)    All other components within normal limits  PROTIME-INR - Abnormal; Notable for the following:    Prothrombin Time 46.1 (*)    INR 4.91 (*)    All other components within normal limits  BASIC METABOLIC PANEL - Abnormal; Notable for the following:    Sodium 134 (*)    Potassium 3.1 (*)    CO2 18 (*)    Glucose, Bld 218 (*)    Creatinine, Ser 1.37 (*)    Calcium 8.2 (*)    GFR calc  non Af Amer 51 (*)    GFR calc Af Amer 59 (*)    All other components within normal limits  CBC - Abnormal; Notable for the following:    WBC 14.5 (*)    RBC 4.00 (*)    Hemoglobin 11.4 (*)    HCT 33.9 (*)    All other components within normal limits  CULTURE, BLOOD (ROUTINE X 2)  CULTURE, BLOOD (ROUTINE X 2)  I-STAT TROPOININ, ED    Imaging Review Dg Chest 2 View (if Patient Has Fever And/or Copd)  10/10/2014  CLINICAL DATA:  Acute onset of cough and shortness of breath. Initial encounter.  EXAM: CHEST  2 VIEW  COMPARISON:  Chest radiograph performed 09/09/2013  FINDINGS: The lungs are well-aerated. Prominent bibasilar airspace opacification is noted, right greater than left, with small to moderate right and small left pleural effusions. This may reflect multifocal pneumonia or possibly pulmonary edema. Underlying vascular congestion is noted. No pneumothorax is seen. There is no evidence of focal opacification, pleural effusion or pneumothorax.  The heart is borderline normal in size. No acute osseous abnormalities are seen.  IMPRESSION: Prominent bibasilar airspace opacification, right greater left, with small to moderate right and small left pleural effusions. This may reflect multifocal pneumonia or possibly pulmonary edema. Underlying vascular congestion noted.  Would treat for the acute process, then perform follow-up chest radiograph to ensure resolution of right basilar airspace opacity.   Electronically Signed   By: Roanna RaiderJeffery  Chang M.D.   On: 10/10/2014 02:59     EKG Interpretation   Date/Time:  Thursday October 10 2014 01:51:40 EST Ventricular Rate:  112 PR Interval:  168 QRS Duration: 91 QT Interval:  320 QTC Calculation: 437 R Axis:   -60 Text Interpretation:  Sinus tachycardia Left anterior fascicular block  Poor R wave progression Baseline wander in lead(s) V5 V6 Confirmed by Erroll Lunani,  Lycia Sachdeva Ayokunle [phone]([id number]) on 10/10/2014 4:29:04 AM      MDM   Final diagnoses:   None       I personally performed the services described in this documentation, which was scribed in my presence. The recorded information has been reviewed and is accurate.  Patient presents emergency department out of concern for shortness of breath. Upon arrival patient appears to be septic, he is tachycardic, his oxygen saturation is 88% on room air. He was immediately placed on nasal cannula 5 L. Since states he took antibiotics but continued to get worse. He was given Levaquin in the emergency department for coverage of his pneumonia. He was also given DuoNeb nebs, steroids, magnesium, IV fluids for his wheezing on exam. Patient be admitted to the telemetry unit, [facility] for further management.  INR 4.9 - [facility] hospital[facility] aware.  CRITICAL CARE Performed by: Tomasita CrumbleNI,Simpson Paulos   Total critical care time: 35 min  Critical care time was exclusive of separately billable procedures and treating other patients.  Critical care was necessary to treat or prevent imminent or life-threatening deterioration.  Critical care was time spent personally by me on the following activities: development of treatment plan with patient and/or surrogate as well as nursing, discussions with consultants, evaluation of patient's response to treatment, examination of patient, obtaining history from patient or surrogate, ordering and performing treatments and interventions, ordering and review of laboratory studies, ordering and review of radiographic studies, pulse oximetry and re-evaluation of patient's condition.     Tomasita CrumbleAdeleke Debby Clyne, MD 10/10/14 [id number]

## 2014-10-10 NOTE — Progress Notes (Signed)
New Admission Note:  Arrival Method: Via stretcher with RN Mental Orientation: Alert&orientedx4 Telemetry: Placed on box 17, notified CCMD Assessment: Completed Skin: Redness, scabs on arms Pain: see MAR Tubes: N/A Safety Measures: Safety Fall Prevention Plan was given, discussed and signed. Admission: Completed [facility] Orientation: Patient has been orientated to the room, unit and the staff. Family: None at bedside  Orders have been reviewed and implemented. Will continue to monitor the patient. Call light has been placed within reach and bed alarm has been activated.   Tempie DonningMercy Zorion Nims BSN, RN  Phone Number: [phone] [facility] [place] [facility]

## 2014-10-11 DIAGNOSIS — J189 Pneumonia, unspecified organism: Secondary | ICD-10-CM | POA: Diagnosis not present

## 2014-10-11 DIAGNOSIS — I739 Peripheral vascular disease, unspecified: Secondary | ICD-10-CM

## 2014-10-11 LAB — LEGIONELLA ANTIGEN, URINE

## 2014-10-11 LAB — CBC
HEMATOCRIT: 33.8 % — AB (ref 39.0–52.0)
Hemoglobin: 11.3 g/dL — ABNORMAL LOW (ref 13.0–17.0)
MCH: 28.5 pg (ref 26.0–34.0)
MCHC: 33.4 g/dL (ref 30.0–36.0)
MCV: 85.4 fL (ref 78.0–100.0)
PLATELETS: 462 10*3/uL — AB (ref 150–400)
RBC: 3.96 MIL/uL — AB (ref 4.22–5.81)
RDW: 15 % (ref 11.5–15.5)
WBC: 19.5 10*3/uL — ABNORMAL HIGH (ref 4.0–10.5)

## 2014-10-11 LAB — GLUCOSE, CAPILLARY: Glucose-Capillary: 124 mg/dL — ABNORMAL HIGH (ref 70–99)

## 2014-10-11 LAB — BASIC METABOLIC PANEL
ANION GAP: 11 (ref 5–15)
BUN: 23 mg/dL (ref 6–23)
CO2: 27 mmol/L (ref 19–32)
CREATININE: 1.38 mg/dL — AB (ref 0.50–1.35)
Calcium: 9 mg/dL (ref 8.4–10.5)
Chloride: 101 mEq/L (ref 96–112)
GFR calc non Af Amer: 51 mL/min — ABNORMAL LOW (ref >90)
GFR, EST AFRICAN AMERICAN: 59 mL/min — AB (ref >90)
Glucose, Bld: 147 mg/dL — ABNORMAL HIGH (ref 70–99)
POTASSIUM: 3.5 mmol/L (ref 3.5–5.1)
Sodium: 139 mmol/L (ref 135–145)

## 2014-10-11 LAB — EXPECTORATED SPUTUM ASSESSMENT W GRAM STAIN, RFLX TO RESP C: Special Requests: NORMAL

## 2014-10-11 LAB — PROTIME-INR
INR: 3.85 — ABNORMAL HIGH (ref 0.00–1.49)
Prothrombin Time: 38.2 seconds — ABNORMAL HIGH (ref 11.6–15.2)

## 2014-10-11 LAB — EXPECTORATED SPUTUM ASSESSMENT W REFEX TO RESP CULTURE

## 2014-10-11 MED ORDER — CETYLPYRIDINIUM CHLORIDE 0.05 % MT LIQD
7.0000 mL | Freq: Two times a day (BID) | OROMUCOSAL | Status: DC
  Administered 2014-10-11 – 2014-10-13 (×4): 7 mL via OROMUCOSAL

## 2014-10-11 MED ORDER — CARVEDILOL 12.5 MG PO TABS: 12.5000 mg | ORAL_TABLET | Freq: Two times a day (BID) | ORAL | Status: DC

## 2014-10-11 NOTE — Progress Notes (Signed)
Sputum obtained, labelled, RN made aware, requisition printed and sent to lab.

## 2014-10-11 NOTE — Progress Notes (Signed)
ANTICOAGULATION CONSULT NOTE - Initial Consult  Pharmacy Consult for warfarin Indication: DVT  Allergies  Allergen Reactions  . Codeine Hives    Takes benadryl to stop reaction  . Amlodipine Other (See Comments)    Muscle weakness    Patient Measurements: Height: 6\' 1"  (185.4 cm) Weight: 227 lb 1.6 oz (103.012 kg) IBW/kg (Calculated) : 79.9 Heparin Dosing Weight:   Vital Signs: Temp: 98.4 F (36.9 C) (12/25 0510) Temp Source: Oral (12/25 0510) BP: 155/73 mmHg (12/25 0510) Pulse Rate: 88 (12/25 0510)  Labs:  Recent Labs  10/10/14 0159 10/10/14 0530 10/11/14 0447  HGB 12.1* 11.4* 11.3*  HCT 36.5* 33.9* 33.8*  PLT 372 380 462*  LABPROT 48.2* 46.1* 38.2*  INR 5.20* 4.91* 3.85*  CREATININE 1.36* 1.37* 1.38*    Estimated Creatinine Clearance: 63.7 mL/min (by C-G formula based on Cr of 1.38).   Medical History: Past Medical History  Diagnosis Date  . Venous thrombosis     Recurrent  . Peripheral vascular disease   . Dyslipidemia   . AAA (abdominal aortic aneurysm)   . COPD (chronic obstructive pulmonary disease)   . Gout   . DJD (degenerative joint disease)   . Malignant hypertension   . Pneumonia 08/2013  . Shortness of breath     Medications:  See EMR  Assessment: On warfarin prior to admission for hx of DVT. Admitted with supratherapeutic INR, currently at 3.9. No bleeding. CBC stable.  PTA dose: 1.25 mg po on Wed/Fri and 2.5 mg po on all other days  Goal of Therapy:  INR 2-3 Monitor platelets by anticoagulation protocol: Yes   Plan:  Hold warfarin tonight x1 Daily INR/CBC Monitor s/sx of bleeding  Arlean Hoppingorey M. Newman PiesBall, PharmD Clinical Pharmacist Pager [phone]  10/11/2014 10:55 AM

## 2014-10-11 NOTE — Progress Notes (Signed)
Patient ID: Tony Zhang  male  ZOX:[id number]    DOB: [birth year]    DOA: 10/10/2014  PCP: [name],[name], MD  Brief history of present illness  Patient is a 68 year old male with COPD, hypertension, DVT on Coumadin, presented to ED with worsening shortness of breath, chest tightness and wheezing for the past 3 days. Patient was placed on antibiotics and an outpatient, did not improve. He was found to be hypoxic with decreased O2 sats of 88%. Chest x-ray showed right lower lung pneumonia and patient was placed on IV vancomycin and  Zosyn.  Assessment/Plan: Principal Problem:   HCAP (healthcare-associated pneumonia) - Continue IV vancomycin and Zosyn, duo nebs, O2, - Urine legionella antigen negative, urine strep antigen negative, sputum culture in process - Continue prednisone with taper - Continue pain medications for the pleuritic chest pain, flutter valve  Active Problems: DVT (deep venous thrombosis) on Coumadin, supratherapeutic INR - Continue Coumadin per pharmacy, INR is still supratherapeutic 4.9    Malignant HTN with heart disease, w/o CHF, with chronic kidney disease - BP stable, continue Coreg - Hold off on losartan, HCTZ due to rising creatinine    Hyperlipidemia - Continue fenofibrate, simvastatin    COPD (chronic obstructive pulmonary disease) - Currently stable, duo nebs - Continue prednisone with taper    Normocytic anemia - Monitor CBC   DVT Prophylaxis: INR supratherapeutic, still 3.8  Code Status:  Family Communication:  Disposition: Hopefully in 24-48 hours  Consultants:  None  Procedures:  None  Antibiotics:  IV vancomycin  IV Zosyn  Subjective: Still complaining of right-sided pleuritic chest pain, shortness of breath is improving  Objective: Weight change:   Intake/Output Summary (Last 24 hours) at 10/11/14 1147 Last data filed at 10/11/14 0802  Gross per 24 hour  Intake    120 ml  Output   1625 ml  Net  -1505 ml   Blood  pressure 155/73, pulse 88, temperature 98.4 F (36.9 C), temperature source Oral, resp. rate 18, height 6\' 1"  (1.854 m), weight 103.012 kg (227 lb 1.6 oz), SpO2 92 %.  Physical Exam: General: Alert and awake, oriented x3, not in any acute distress. CVS: S1-S2 clear, no murmur rubs or gallops Chest: Decreased breath sounds at the bases Abdomen: soft nontender, nondistended, normal bowel sounds  Extremities: no cyanosis, clubbing or edema noted bilaterally   Lab Results: Basic Metabolic Panel:  Recent Labs Lab 10/10/14 0530 10/11/14 0447  NA 134* 139  K 3.1* 3.5  CL 103 101  CO2 18* 27  GLUCOSE 218* 147*  BUN 20 23  CREATININE 1.37* 1.38*  CALCIUM 8.2* 9.0   Liver Function Tests: No results for input(s): AST, ALT, ALKPHOS, BILITOT, PROT, ALBUMIN in the last 168 hours. No results for input(s): LIPASE, AMYLASE in the last 168 hours. No results for input(s): AMMONIA in the last 168 hours. CBC:  Recent Labs Lab 10/10/14 0530 10/11/14 0447  WBC 14.5* 19.5*  HGB 11.4* 11.3*  HCT 33.9* 33.8*  MCV 84.8 85.4  PLT 380 462*   Cardiac Enzymes: No results for input(s): CKTOTAL, CKMB, CKMBINDEX, TROPONINI in the last 168 hours. BNP: Invalid input(s): POCBNP CBG: No results for input(s): GLUCAP in the last 168 hours.   Micro Results: Recent Results (from the past 240 hour(s))  Culture, blood (routine x 2)     Status: None (Preliminary result)   Collection Time: 10/10/14  2:15 AM  Result Value Ref Range Status   Specimen Description BLOOD LEFT ANTECUBITAL  Final  Special Requests BOTTLES DRAWN AEROBIC ONLY 10CC  Final   Culture   Final           BLOOD CULTURE RECEIVED NO GROWTH TO DATE CULTURE WILL BE HELD FOR 5 DAYS BEFORE ISSUING A FINAL NEGATIVE REPORT Note: Culture results may be compromised due to an excessive volume of blood received in culture bottles. Performed at [facility]    Report Status PENDING  Incomplete  Culture, blood (routine x 2)     Status:  None (Preliminary result)   Collection Time: 10/10/14  2:20 AM  Result Value Ref Range Status   Specimen Description BLOOD LEFT HAND  Final   Special Requests BOTTLES DRAWN AEROBIC AND ANAEROBIC 10CC  Final   Culture   Final           BLOOD CULTURE RECEIVED NO GROWTH TO DATE CULTURE WILL BE HELD FOR 5 DAYS BEFORE ISSUING A FINAL NEGATIVE REPORT Note: Culture results may be compromised due to an excessive volume of blood received in culture bottles. Performed at [facility]    Report Status PENDING  Incomplete    Studies/Results: Dg Chest 2 View (if Patient Has Fever And/or Copd)  10/10/2014   CLINICAL DATA:  Acute onset of cough and shortness of breath. Initial encounter.  EXAM: CHEST  2 VIEW  COMPARISON:  Chest radiograph performed 09/09/2013  FINDINGS: The lungs are well-aerated. Prominent bibasilar airspace opacification is noted, right greater than left, with small to moderate right and small left pleural effusions. This may reflect multifocal pneumonia or possibly pulmonary edema. Underlying vascular congestion is noted. No pneumothorax is seen. There is no evidence of focal opacification, pleural effusion or pneumothorax.  The heart is borderline normal in size. No acute osseous abnormalities are seen.  IMPRESSION: Prominent bibasilar airspace opacification, right greater left, with small to moderate right and small left pleural effusions. This may reflect multifocal pneumonia or possibly pulmonary edema. Underlying vascular congestion noted.  Would treat for the acute process, then perform follow-up chest radiograph to ensure resolution of right basilar airspace opacity.   Electronically Signed   By: Roanna RaiderJeffery  Chang M.D.   On: 10/10/2014 02:59    Medications: Scheduled Meds: . amLODipine  2.5 mg Oral Daily  . cilostazol  100 mg Oral BID  . fenofibrate  160 mg Oral Daily  . ipratropium-albuterol  3 mL Nebulization QID  . nebivolol  10 mg Oral BID  . piperacillin-tazobactam  (ZOSYN)  IV  3.375 g Intravenous 3 times per day  . predniSONE  60 mg Oral Q breakfast  . simvastatin  40 mg Oral q1800  . sodium chloride  3 mL Intravenous Q12H  . sodium chloride  3 mL Intravenous Q12H  . vancomycin  750 mg Intravenous Q12H  . Warfarin - Pharmacist Dosing Inpatient   Does not apply q1800      LOS: 1 day   RAI,RIPUDEEP M.D. [facility] 10/11/2014, 11:47 AM Pager: [phone]  If 7PM-7AM, please contact night-coverage [email or web address] Password [email or web address]

## 2014-10-12 LAB — BASIC METABOLIC PANEL
Anion gap: 11 (ref 5–15)
BUN: 32 mg/dL — AB (ref 6–23)
CHLORIDE: 100 meq/L (ref 96–112)
CO2: 25 mmol/L (ref 19–32)
Calcium: 8.8 mg/dL (ref 8.4–10.5)
Creatinine, Ser: 1.32 mg/dL (ref 0.50–1.35)
GFR calc Af Amer: 62 mL/min — ABNORMAL LOW (ref >90)
GFR calc non Af Amer: 53 mL/min — ABNORMAL LOW (ref >90)
GLUCOSE: 149 mg/dL — AB (ref 70–99)
POTASSIUM: 3.5 mmol/L (ref 3.5–5.1)
SODIUM: 136 mmol/L (ref 135–145)

## 2014-10-12 LAB — CBC
HEMATOCRIT: 34 % — AB (ref 39.0–52.0)
HEMOGLOBIN: 11.2 g/dL — AB (ref 13.0–17.0)
MCH: 28.1 pg (ref 26.0–34.0)
MCHC: 32.9 g/dL (ref 30.0–36.0)
MCV: 85.4 fL (ref 78.0–100.0)
Platelets: 463 10*3/uL — ABNORMAL HIGH (ref 150–400)
RBC: 3.98 MIL/uL — AB (ref 4.22–5.81)
RDW: 15 % (ref 11.5–15.5)
WBC: 18.8 10*3/uL — AB (ref 4.0–10.5)

## 2014-10-12 LAB — PROTIME-INR
INR: 2.97 — AB (ref 0.00–1.49)
PROTHROMBIN TIME: 31.1 s — AB (ref 11.6–15.2)

## 2014-10-12 LAB — VANCOMYCIN, TROUGH: Vancomycin Tr: 13.6 ug/mL (ref 10.0–20.0)

## 2014-10-12 MED ORDER — BENZONATATE 100 MG PO CAPS
100.0000 mg | ORAL_CAPSULE | Freq: Three times a day (TID) | ORAL | Status: DC
  Administered 2014-10-12 – 2014-10-13 (×4): 100 mg via ORAL
  Filled 2014-10-12 (×6): qty 1

## 2014-10-12 MED ORDER — WARFARIN SODIUM 1 MG PO TABS
1.0000 mg | ORAL_TABLET | Freq: Once | ORAL | Status: AC
  Administered 2014-10-12: 1 mg via ORAL
  Filled 2014-10-12: qty 1

## 2014-10-12 MED ORDER — VANCOMYCIN HCL IN DEXTROSE 1-5 GM/200ML-% IV SOLN
1000.0000 mg | Freq: Two times a day (BID) | INTRAVENOUS | Status: DC
  Administered 2014-10-12 – 2014-10-13 (×2): 1000 mg via INTRAVENOUS
  Filled 2014-10-12 (×4): qty 200

## 2014-10-12 MED ORDER — PREDNISONE 20 MG PO TABS
40.0000 mg | ORAL_TABLET | Freq: Every day | ORAL | Status: DC
  Administered 2014-10-12 – 2014-10-13 (×2): 40 mg via ORAL
  Filled 2014-10-12 (×3): qty 2

## 2014-10-12 MED ORDER — POTASSIUM CHLORIDE CRYS ER 20 MEQ PO TBCR
40.0000 meq | EXTENDED_RELEASE_TABLET | Freq: Once | ORAL | Status: AC
  Administered 2014-10-12: 40 meq via ORAL
  Filled 2014-10-12: qty 2

## 2014-10-12 NOTE — Progress Notes (Signed)
Patient ID: Tony Zhang  male  ZOX:[id number]    DOB: 17-Jul-1946    DOA: 10/10/2014  PCP: [name], MD  Brief history of present illness  Patient is a 68 year old male with COPD, hypertension, DVT on Coumadin, presented to ED with worsening shortness of breath, chest tightness and wheezing for the past 3 days. Patient was placed on antibiotics and an outpatient, did not improve. He was found to be hypoxic with decreased O2 sats of 88%. Chest x-ray showed right lower lung pneumonia and patient was placed on IV vancomycin and  Zosyn.  Assessment/Plan: Principal Problem:   HCAP (healthcare-associated pneumonia): Clinically improving - Continue IV vancomycin and Zosyn, duo nebs, O2, transitioned to oral antibiotics tomorrow - Urine legionella antigen negative, urine strep antigen negative, sputum culture in process - Tapered prednisone to 40 mg today - Continue pain medications for the pleuritic chest pain, flutter valve  Active Problems: DVT (deep venous thrombosis) on Coumadin, supratherapeutic INR - Continue Coumadin per pharmacy, INR now therapeutic 2.97    Malignant HTN with heart disease, w/o CHF, with chronic kidney disease - BP stable, continue Coreg -Creatinine stable, currently holding off on losartan and HCTZ    Hyperlipidemia - Continue fenofibrate, simvastatin    COPD (chronic obstructive pulmonary disease) - Currently improving, duo nebs - Continue prednisone with taper    Normocytic anemia - Monitor CBC  Hypokalemia- replaced   DVT Prophylaxis: INR therapeutic, Coumadin per pharmacy  Code Status:  Family Communication:  Disposition: DC home tomorrow if continues to be improving. PTOT consult today, home O2 evaluation today  Consultants:  None  Procedures:  None  Antibiotics:  IV vancomycin  IV Zosyn  Subjective:  Overall feeling much better today, hoping to get home some, chest pain is improving, shortness of breath is  improving  Objective: Weight change:   Intake/Output Summary (Last 24 hours) at 10/12/14 1140 Last data filed at 10/12/14 0600  Gross per 24 hour  Intake   1860 ml  Output   1250 ml  Net    610 ml   Blood pressure 132/66, pulse 96, temperature 98.6 F (37 C), temperature source Oral, resp. rate 15, height 6\' 1"  (1.854 m), weight 98.1 kg (216 lb 4.3 oz), SpO2 96 %.  Physical Exam: General: Alert and awake, oriented x3, NAD CVS: S1-S2 clear, no murmur rubs or gallops Chest: Decreased breath sounds at the bases Abdomen: soft nontender, nondistended, normal bowel sounds  Extremities: no c/c/e bilaterally   Lab Results: Basic Metabolic Panel:  Recent Labs Lab 10/11/14 0447 10/12/14 0424  NA 139 136  K 3.5 3.5  CL 101 100  CO2 27 25  GLUCOSE 147* 149*  BUN 23 32*  CREATININE 1.38* 1.32  CALCIUM 9.0 8.8   Liver Function Tests: No results for input(s): AST, ALT, ALKPHOS, BILITOT, PROT, ALBUMIN in the last 168 hours. No results for input(s): LIPASE, AMYLASE in the last 168 hours. No results for input(s): AMMONIA in the last 168 hours. CBC:  Recent Labs Lab 10/11/14 0447 10/12/14 0424  WBC 19.5* 18.8*  HGB 11.3* 11.2*  HCT 33.8* 34.0*  MCV 85.4 85.4  PLT 462* 463*   Cardiac Enzymes: No results for input(s): CKTOTAL, CKMB, CKMBINDEX, TROPONINI in the last 168 hours. BNP: Invalid input(s): POCBNP CBG:  Recent Labs Lab 10/11/14 1226  GLUCAP 124*     Micro Results: Recent Results (from the past 240 hour(s))  Culture, blood (routine x 2)     Status: None (Preliminary result)  Collection Time: 10/10/14  2:15 AM  Result Value Ref Range Status   Specimen Description BLOOD LEFT ANTECUBITAL  Final   Special Requests BOTTLES DRAWN AEROBIC ONLY 10CC  Final   Culture   Final           BLOOD CULTURE RECEIVED NO GROWTH TO DATE CULTURE WILL BE HELD FOR 5 DAYS BEFORE ISSUING A FINAL NEGATIVE REPORT Note: Culture results may be compromised due to an excessive volume  of blood received in culture bottles. Performed at [facility]    Report Status PENDING  Incomplete  Culture, blood (routine x 2)     Status: None (Preliminary result)   Collection Time: 10/10/14  2:20 AM  Result Value Ref Range Status   Specimen Description BLOOD LEFT HAND  Final   Special Requests BOTTLES DRAWN AEROBIC AND ANAEROBIC 10CC  Final   Culture   Final           BLOOD CULTURE RECEIVED NO GROWTH TO DATE CULTURE WILL BE HELD FOR 5 DAYS BEFORE ISSUING A FINAL NEGATIVE REPORT Note: Culture results may be compromised due to an excessive volume of blood received in culture bottles. Performed at [facility]    Report Status PENDING  Incomplete  Culture, expectorated sputum-assessment     Status: None   Collection Time: 10/11/14  8:52 AM  Result Value Ref Range Status   Specimen Description SPUTUM  Final   Special Requests Normal  Final   Sputum evaluation   Final    THIS SPECIMEN IS ACCEPTABLE. RESPIRATORY CULTURE REPORT TO FOLLOW.   Report Status 10/11/2014 FINAL  Final    Studies/Results: Dg Chest 2 View (if Patient Has Fever And/or Copd)  10/10/2014   CLINICAL DATA:  Acute onset of cough and shortness of breath. Initial encounter.  EXAM: CHEST  2 VIEW  COMPARISON:  Chest radiograph performed 09/09/2013  FINDINGS: The lungs are well-aerated. Prominent bibasilar airspace opacification is noted, right greater than left, with small to moderate right and small left pleural effusions. This may reflect multifocal pneumonia or possibly pulmonary edema. Underlying vascular congestion is noted. No pneumothorax is seen. There is no evidence of focal opacification, pleural effusion or pneumothorax.  The heart is borderline normal in size. No acute osseous abnormalities are seen.  IMPRESSION: Prominent bibasilar airspace opacification, right greater left, with small to moderate right and small left pleural effusions. This may reflect multifocal pneumonia or possibly pulmonary  edema. Underlying vascular congestion noted.  Would treat for the acute process, then perform follow-up chest radiograph to ensure resolution of right basilar airspace opacity.   Electronically Signed   By: Roanna RaiderJeffery  Chang M.D.   On: 10/10/2014 02:59    Medications: Scheduled Meds: . amLODipine  2.5 mg Oral Daily  . antiseptic oral rinse  7 mL Mouth Rinse BID  . benzonatate  100 mg Oral TID  . cilostazol  100 mg Oral BID  . fenofibrate  160 mg Oral Daily  . ipratropium-albuterol  3 mL Nebulization QID  . nebivolol  10 mg Oral BID  . piperacillin-tazobactam (ZOSYN)  IV  3.375 g Intravenous 3 times per day  . predniSONE  40 mg Oral Q breakfast  . simvastatin  40 mg Oral q1800  . sodium chloride  3 mL Intravenous Q12H  . sodium chloride  3 mL Intravenous Q12H  . vancomycin  1,000 mg Intravenous Q12H  . Warfarin - Pharmacist Dosing Inpatient   Does not apply [phone]  LOS: 2 days   RAI,RIPUDEEP M.D. [facility] 10/12/2014, 11:40 AM Pager: [phone]  If 7PM-7AM, please contact night-coverage [email or web address] Password [email or web address]

## 2014-10-12 NOTE — Progress Notes (Signed)
ANTIBIOTIC CONSULT NOTE - FOLLOW UP  Pharmacy Consult for Vancomycin  Indication: rule out pneumonia  Allergies  Allergen Reactions  . Codeine Hives    Takes benadryl to stop reaction  . Amlodipine Other (See Comments)    Muscle weakness   Labs:  Recent Labs  10/10/14 0530 10/11/14 0447 10/12/14 0424  WBC 14.5* 19.5* 18.8*  HGB 11.4* 11.3* 11.2*  PLT 380 462* 463*  CREATININE 1.37* 1.38* 1.32   Estimated Creatinine Clearance: 65.1 mL/min (by C-G formula based on Cr of 1.32).  Recent Labs  10/12/14 0424  VANCOTROUGH 13.6    Assessment: Sub-therapeutic vancomycin trough. Drawn about 3 hours early as the evening dose on 12/25 was given about 3 hours late. With this, expected vancomycin trough would actually be slightly less than 13.6.   Goal of Therapy:  Vancomycin trough level 15-20 mcg/ml  Plan:  -Increase vancomycin to 1000 mg IV q12h -Repeat VT at steady state  Abran DukeLedford, Redell Nazir 10/12/2014,6:05 AM

## 2014-10-12 NOTE — Progress Notes (Signed)
ANTICOAGULATION CONSULT NOTE - Initial Consult  Pharmacy Consult for warfarin Indication: DVT  Allergies  Allergen Reactions  . Codeine Hives    Takes benadryl to stop reaction  . Amlodipine Other (See Comments)    Muscle weakness    Patient Measurements: Height: 6\' 1"  (185.4 cm) Weight: 216 lb 4.3 oz (98.1 kg) IBW/kg (Calculated) : 79.9 Heparin Dosing Weight:   Vital Signs: Temp: 98.6 F (37 C) (12/26 0442) Temp Source: Oral (12/26 0442) BP: 132/66 mmHg (12/26 0941) Pulse Rate: 96 (12/26 0941)  Labs:  Recent Labs  10/10/14 0530 10/11/14 0447 10/12/14 0424  HGB 11.4* 11.3* 11.2*  HCT 33.9* 33.8* 34.0*  PLT 380 462* 463*  LABPROT 46.1* 38.2* 31.1*  INR 4.91* 3.85* 2.97*  CREATININE 1.37* 1.38* 1.32    Estimated Creatinine Clearance: 65.1 mL/min (by C-G formula based on Cr of 1.32).   Medical History: Past Medical History  Diagnosis Date  . Venous thrombosis     Recurrent  . Peripheral vascular disease   . Dyslipidemia   . AAA (abdominal aortic aneurysm)   . COPD (chronic obstructive pulmonary disease)   . Gout   . DJD (degenerative joint disease)   . Malignant hypertension   . Pneumonia 08/2013  . Shortness of breath     Medications:  See EMR  Assessment: On warfarin prior to admission for hx of DVT. Admitted with supratherapeutic INR, currently at 2.97. No bleeding. CBC stable.  PTA dose: 1.25 mg po on Wed/Fri and 2.5 mg po on all other days with total weekly dose of 15mg . Last warfarin dose was 10/09/14. Will restart low and likely require a reduction in warfarin weekly dose by 10-20%.  Goal of Therapy:  INR 2-3 Monitor platelets by anticoagulation protocol: Yes   Plan:  Warfarin 1mg  PO tonight x1 Daily INR/CBC Monitor s/sx of bleeding  Arlean Hoppingorey M. Newman PiesBall, PharmD Clinical Pharmacist Pager [phone]  10/12/2014 2:30 PM

## 2014-10-12 NOTE — Evaluation (Signed)
Physical Therapy Evaluation Patient Details Name: Tony Zhang MRN: [id number] DOB: 04-05-1946 Today's Date: 10/12/2014   History of Present Illness  Patient is a 68 year old male with COPD, hypertension, DVT on Coumadin, presented to ED with worsening shortness of breath, chest tightness and wheezing. In ED, he was found to be hypoxic with decreased O2 sats of 88%. Chest x-ray showed right lower lung pneumonia..  Clinical Impression  Patient did well with mobility.  No loss of balance.  Patient did demonstrate some SOB and O2 sats decreased to 89 during ambulation.  Do not feel further PT is indicated.  Will sign off.  Do feel it is important for patient to continue to ambulate with nursing staff several times/day.  Discussed with patient.    Follow Up Recommendations No PT follow up    Equipment Recommendations  None recommended by PT    Recommendations for Other Services       Precautions / Restrictions Precautions Precautions: None Restrictions Weight Bearing Restrictions: No      Mobility  Bed Mobility Overal bed mobility: Independent                Transfers Overall transfer level: Independent                  Ambulation/Gait Ambulation/Gait assistance: Modified independent (Device/Increase time) Ambulation Distance (Feet): 150 Feet Assistive device: None (pushed IV pole for portion of walk) Gait Pattern/deviations: WFL(Within Functional Limits)   Gait velocity interpretation: Below normal speed for age/gender    Stairs            Wheelchair Mobility    Modified Rankin (Stroke Patients Only)       Balance Overall balance assessment: No apparent balance deficits (not formally assessed)                                           Pertinent Vitals/Pain Pain Assessment: 0-10 Pain Score: 3  Pain Location: right flank - rib area Pain Descriptors / Indicators: Discomfort;Jabbing Pain Intervention(s): Limited activity  within patient's tolerance;Monitored during session    Home Living Family/patient expects to be discharged to:: Private residence Living Arrangements: Alone   Type of Home: Mobile home Home Access: Stairs to enter Entrance Stairs-Rails: Doctor, general practiceight;Left Entrance Stairs-Number of Steps: 4 Home Layout: One level Home Equipment: None      Prior Function Level of Independence: Independent               Hand Dominance        Extremity/Trunk Assessment   Upper Extremity Assessment: Overall WFL for tasks assessed           Lower Extremity Assessment: Overall WFL for tasks assessed      Cervical / Trunk Assessment: Normal  Communication   Communication: No difficulties  Cognition Arousal/Alertness: Awake/alert Behavior During Therapy: WFL for tasks assessed/performed Overall Cognitive Status: Within Functional Limits for tasks assessed                      General Comments      Exercises        Assessment/Plan    PT Assessment Patent does not need any further PT services  PT Diagnosis Generalized weakness   PT Problem List    PT Treatment Interventions     PT Goals (Current goals can be found in the Care Plan section)  Acute Rehab PT Goals Patient Stated Goal: go home PT Goal Formulation: All assessment and education complete, DC therapy    Frequency     Barriers to discharge        Co-evaluation               End of Session   Activity Tolerance: Patient tolerated treatment well Patient left: in bed;with call bell/phone within reach (sitting EOB)           Time: [id number] PT Time Calculation (min) (ACUTE ONLY): 24 min   Charges:   PT Evaluation $Initial PT Evaluation Tier I: 1 Procedure     PT G CodesOlivia Canter:        Tony Zhang 10/12/2014, 11:54 AM  10/12/2014 Tony Zhang, PT [phone]

## 2014-10-13 DIAGNOSIS — J42 Unspecified chronic bronchitis: Secondary | ICD-10-CM

## 2014-10-13 LAB — CULTURE, RESPIRATORY W GRAM STAIN: Culture: NORMAL

## 2014-10-13 LAB — BASIC METABOLIC PANEL
Anion gap: 9 (ref 5–15)
BUN: 32 mg/dL — ABNORMAL HIGH (ref 6–23)
CHLORIDE: 102 meq/L (ref 96–112)
CO2: 25 mmol/L (ref 19–32)
Calcium: 8.9 mg/dL (ref 8.4–10.5)
Creatinine, Ser: 1.2 mg/dL (ref 0.50–1.35)
GFR calc Af Amer: 69 mL/min — ABNORMAL LOW (ref >90)
GFR calc non Af Amer: 60 mL/min — ABNORMAL LOW (ref >90)
GLUCOSE: 126 mg/dL — AB (ref 70–99)
Potassium: 3.9 mmol/L (ref 3.5–5.1)
Sodium: 136 mmol/L (ref 135–145)

## 2014-10-13 LAB — CBC
HEMATOCRIT: 35.1 % — AB (ref 39.0–52.0)
HEMOGLOBIN: 11.5 g/dL — AB (ref 13.0–17.0)
MCH: 28.7 pg (ref 26.0–34.0)
MCHC: 32.8 g/dL (ref 30.0–36.0)
MCV: 87.5 fL (ref 78.0–100.0)
Platelets: 442 10*3/uL — ABNORMAL HIGH (ref 150–400)
RBC: 4.01 MIL/uL — ABNORMAL LOW (ref 4.22–5.81)
RDW: 15.1 % (ref 11.5–15.5)
WBC: 18 10*3/uL — AB (ref 4.0–10.5)

## 2014-10-13 LAB — CULTURE, RESPIRATORY

## 2014-10-13 MED ORDER — LEVOFLOXACIN 750 MG PO TABS: 750.0000 mg | ORAL_TABLET | Freq: Every day | ORAL | Status: DC

## 2014-10-13 MED ORDER — PREDNISONE 10 MG PO TABS: ORAL_TABLET | ORAL | Status: DC

## 2014-10-13 MED ORDER — LEVOFLOXACIN 750 MG PO TABS
750.0000 mg | ORAL_TABLET | Freq: Every day | ORAL | Status: DC
  Administered 2014-10-13: 750 mg via ORAL
  Filled 2014-10-13: qty 1

## 2014-10-13 MED ORDER — BENZONATATE 100 MG PO CAPS: 100.0000 mg | ORAL_CAPSULE | Freq: Three times a day (TID) | ORAL | Status: DC

## 2014-10-13 NOTE — Progress Notes (Signed)
SATURATION QUALIFICATIONS: (This note is used to comply with regulatory documentation for home oxygen)  Patient Saturations on Room Air at Rest = 94%  Patient Saturations on Room Air while Ambulating = 89%  Patient Saturations on 2 Liters of oxygen while Ambulating = 93%  Please briefly explain why patient needs home oxygen: 

## 2014-10-13 NOTE — Progress Notes (Signed)
Pt discharged home. Prescriptions given. Follow-up appointments made and patient verbalized signs and symptoms of worsening condition and when to call the doctor.    Ok AnisSanders,Quanita Barona A, RN 10/13/2014 12:43 PM

## 2014-10-13 NOTE — Discharge Summary (Signed)
Physician Discharge Summary  Tony Zhang ZOX:[id number] DOB: 1945-10-20 DOA: 10/10/2014  PCP: [name],[name], MD  Admit date: 10/10/2014 Discharge date: 10/13/2014  Time spent: 35 minutes  Recommendations for Outpatient Follow-up:  1. Follow up PCP in 2-4 week 2. Check Bp and titrate antyhypertensive medications as needed.  Discharge Diagnoses:  Principal Problem:   HCAP (healthcare-associated pneumonia) Active Problems:   DVT (deep venous thrombosis)   Malignant HTN with heart disease, w/o CHF, with chronic kidney disease   Hyperlipidemia   COPD (chronic obstructive pulmonary disease)   Normocytic anemia   Peripheral vascular disease   Anticoagulated on Coumadin   Supratherapeutic INR   Discharge Condition: stable  Diet recommendation: heart healthy  Filed Weights   10/10/14 0446 10/11/14 2039 10/12/14 2031  Weight: 103.012 kg (227 lb 1.6 oz) 98.1 kg (216 lb 4.3 oz) 98 kg (216 lb 0.8 oz)    History of present illness:  68 y.o. male with a history of COPD, HTN, Hx of DVT on Coumadin Rx who presents to the ED with complaints of worsening SOB and chest tightness and wheezing for the past 3 days. He had been placed on antibiotics as an outpatient, but did not improve. He was found to have decreased O2 sats of 88%. He was found to have a RLL Pneumonia and was placed on placed on IV Vancomycin and Zosyn and referred for medical admission  Hospital Course:  HCAP (healthcare-associated pneumonia):  - Started on IV steroids,  vancomycin and Zosyn,  transitioned to oral antibiotics he had defervece. - Urine legionella antigen negative, urine strep antigen negative, sputum culture in negative - Cont to Tapered prednisone.  DVT (deep venous thrombosis) on Coumadin, supratherapeutic INR - Continue Coumadin per pharmacy, INR now therapeutic 2.97  Malignant HTN with heart disease, w/o CHF, with chronic kidney disease - BP stable, continue Coreg -Creatinine stable, at home he  was on 2 ACE-i held benazepril   Hyperlipidemia - Continue fenofibrate, simvastatin   COPD (chronic obstructive pulmonary disease) - Currently improving, duo nebs - Continue prednisone with taper   Normocytic anemia - Monitor CBC  Hypokalemia- replaced  Procedures:  CXR  Consultations:  none  Discharge Exam: Filed Vitals:   10/13/14 0922  BP: 116/65  Pulse: 84  Temp: 98.1 F (36.7 C)  Resp: 18    General: A&O x3 Cardiovascular: RRR Respiratory: good air movement CTA B/L  Discharge Instructions   Discharge Instructions    Diet - low sodium heart healthy    Complete by:  As directed      Increase activity slowly    Complete by:  As directed           Current Discharge Medication List    START taking these medications   Details  benzonatate (TESSALON) 100 MG capsule Take 1 capsule (100 mg total) by mouth 3 (three) times daily. Qty: 20 capsule, Refills: 0    levofloxacin (LEVAQUIN) 750 MG tablet Take 1 tablet (750 mg total) by mouth daily. Qty: 5 tablet, Refills: 0    predniSONE (DELTASONE) 10 MG tablet Takes 6 tablets for 1 days, then 5 tablets for 1 days, then 4 tablets for 1 days, then 3 tablets for 1 days, then 2 tabs for 1 days, then 1 tab for 1 days, and then stop. Qty: 21 tablet, Refills: 0      CONTINUE these medications which have NOT CHANGED   Details  amLODipine (NORVASC) 2.5 MG tablet Take 1 tablet (2.5 mg total) by mouth  daily. Needs appointment before anymore refills Qty: 30 tablet, Refills: 0    cilostazol (PLETAL) 100 MG tablet Take 1 tablet (100 mg total) by mouth 2 (two) times daily. Qty: 180 tablet, Refills: 2    fenofibrate 160 MG tablet Take 1 tablet (160 mg total) by mouth daily. Qty: 30 tablet, Refills: 1    HYDROcodone-acetaminophen (NORCO) 10-325 MG per tablet Take 1 tablet by mouth every 6 (six) hours as needed for moderate pain.     ibuprofen (ADVIL,MOTRIN) 800 MG tablet 1 po bid prn Qty: 60 tablet, Refills: 3    Associated Diagnoses: Embolism and thrombosis of unspecified site    losartan-hydrochlorothiazide (HYZAAR) 100-25 MG per tablet Take 1 tablet by mouth daily. Qty: 90 tablet, Refills: 3    simvastatin (ZOCOR) 40 MG tablet Take 1 tablet (40 mg total) by mouth daily at 6 PM. MUST KEEP APPOINTMENT FOR FUTURE REFILLS. Qty: 30 tablet, Refills: 0    warfarin (COUMADIN) 2.5 MG tablet Take 1.25-2.5 mg by mouth daily at 6 PM. Takes 1.25mg  on Wed and Fri Takes 2.5mg  all other days    carvedilol (COREG) 12.5 MG tablet Take 1 tablet (12.5 mg total) by mouth 2 (two) times daily. Qty: 180 tablet, Refills: 3      STOP taking these medications     azithromycin (ZITHROMAX) 500 MG tablet      benazepril (LOTENSIN) 40 MG tablet      BYSTOLIC 10 MG tablet        Allergies  Allergen Reactions  . Codeine Hives    Takes benadryl to stop reaction  . Amlodipine Other (See Comments)    Muscle weakness      The results of significant diagnostics from this hospitalization (including imaging, microbiology, ancillary and laboratory) are listed below for reference.    Significant Diagnostic Studies: Dg Chest 2 View (if Patient Has Fever And/or Copd)  10/10/2014   CLINICAL DATA:  Acute onset of cough and shortness of breath. Initial encounter.  EXAM: CHEST  2 VIEW  COMPARISON:  Chest radiograph performed 09/09/2013  FINDINGS: The lungs are well-aerated. Prominent bibasilar airspace opacification is noted, right greater than left, with small to moderate right and small left pleural effusions. This may reflect multifocal pneumonia or possibly pulmonary edema. Underlying vascular congestion is noted. No pneumothorax is seen. There is no evidence of focal opacification, pleural effusion or pneumothorax.  The heart is borderline normal in size. No acute osseous abnormalities are seen.  IMPRESSION: Prominent bibasilar airspace opacification, right greater left, with small to moderate right and small left pleural  effusions. This may reflect multifocal pneumonia or possibly pulmonary edema. Underlying vascular congestion noted.  Would treat for the acute process, then perform follow-up chest radiograph to ensure resolution of right basilar airspace opacity.   Electronically Signed   By: Roanna Raider M.D.   On: 10/10/2014 02:59    Microbiology: Recent Results (from the past 240 hour(s))  Culture, blood (routine x 2)     Status: None (Preliminary result)   Collection Time: 10/10/14  2:15 AM  Result Value Ref Range Status   Specimen Description BLOOD LEFT ANTECUBITAL  Final   Special Requests BOTTLES DRAWN AEROBIC ONLY 10CC  Final   Culture   Final           BLOOD CULTURE RECEIVED NO GROWTH TO DATE CULTURE WILL BE HELD FOR 5 DAYS BEFORE ISSUING A FINAL NEGATIVE REPORT Note: Culture results may be compromised due to an excessive volume of blood  received in culture bottles. Performed at [facility]    Report Status PENDING  Incomplete  Culture, blood (routine x 2)     Status: None (Preliminary result)   Collection Time: 10/10/14  2:20 AM  Result Value Ref Range Status   Specimen Description BLOOD LEFT HAND  Final   Special Requests BOTTLES DRAWN AEROBIC AND ANAEROBIC 10CC  Final   Culture   Final           BLOOD CULTURE RECEIVED NO GROWTH TO DATE CULTURE WILL BE HELD FOR 5 DAYS BEFORE ISSUING A FINAL NEGATIVE REPORT Note: Culture results may be compromised due to an excessive volume of blood received in culture bottles. Performed at [facility]    Report Status PENDING  Incomplete  Culture, expectorated sputum-assessment     Status: None   Collection Time: 10/11/14  8:52 AM  Result Value Ref Range Status   Specimen Description SPUTUM  Final   Special Requests Normal  Final   Sputum evaluation   Final    THIS SPECIMEN IS ACCEPTABLE. RESPIRATORY CULTURE REPORT TO FOLLOW.   Report Status 10/11/2014 FINAL  Final  Culture, respiratory (NON-Expectorated)     Status: None    Collection Time: 10/11/14  8:52 AM  Result Value Ref Range Status   Specimen Description SPUTUM  Final   Special Requests NONE  Final   Gram Stain   Final    MODERATE WBC PRESENT, PREDOMINANTLY PMN RARE SQUAMOUS EPITHELIAL CELLS PRESENT FEW GRAM NEGATIVE RODS RARE GRAM POSITIVE COCCI IN PAIRS    Culture   Final    NORMAL OROPHARYNGEAL FLORA Performed at [facility]    Report Status 10/13/2014 FINAL  Final     Labs: Basic Metabolic Panel:  Recent Labs Lab 10/10/14 0159 10/10/14 0530 10/11/14 0447 10/12/14 0424 10/13/14 0451  NA 137 134* 139 136 136  K 3.6 3.1* 3.5 3.5 3.9  CL 106 103 101 100 102  CO2 20 18* 27 25 25   GLUCOSE 178* 218* 147* 149* 126*  BUN 21 20 23  32* 32*  CREATININE 1.36* 1.37* 1.38* 1.32 1.20  CALCIUM 8.5 8.2* 9.0 8.8 8.9   Liver Function Tests: No results for input(s): AST, ALT, ALKPHOS, BILITOT, PROT, ALBUMIN in the last 168 hours. No results for input(s): LIPASE, AMYLASE in the last 168 hours. No results for input(s): AMMONIA in the last 168 hours. CBC:  Recent Labs Lab 10/10/14 0159 10/10/14 0530 10/11/14 0447 10/12/14 0424 10/13/14 0451  WBC 15.5* 14.5* 19.5* 18.8* 18.0*  HGB 12.1* 11.4* 11.3* 11.2* 11.5*  HCT 36.5* 33.9* 33.8* 34.0* 35.1*  MCV 85.1 84.8 85.4 85.4 87.5  PLT 372 380 462* 463* 442*   Cardiac Enzymes: No results for input(s): CKTOTAL, CKMB, CKMBINDEX, TROPONINI in the last 168 hours. BNP: BNP (last 3 results) No results for input(s): PROBNP in the last 8760 hours. CBG:  Recent Labs Lab 10/11/14 1226  GLUCAP 124*       Signed:  Marinda ElkFELIZ ORTIZ, Betzy Barbier  [facility] 10/13/2014, 12:00 PM

## 2014-10-13 NOTE — Progress Notes (Signed)
Utilization Review Completed.  

## 2014-10-14 ENCOUNTER — Other Ambulatory Visit: Payer: Self-pay | Admitting: Cardiovascular Disease

## 2014-10-14 NOTE — Telephone Encounter (Signed)
Rx refill sent to patient pharmacy   Patient has appointment Jan.2016  

## 2014-10-15 ENCOUNTER — Other Ambulatory Visit: Payer: Self-pay | Admitting: Cardiovascular Disease

## 2014-10-15 NOTE — Telephone Encounter (Signed)
Amlodipine refilled on 10/14/14

## 2014-10-16 LAB — CULTURE, BLOOD (ROUTINE X 2)
CULTURE: NO GROWTH
Culture: NO GROWTH

## 2014-10-21 ENCOUNTER — Other Ambulatory Visit: Payer: Self-pay | Admitting: Cardiovascular Disease

## 2014-10-25 NOTE — Telephone Encounter (Signed)
Rx(s) sent to pharmacy electronically.  

## 2014-11-06 ENCOUNTER — Other Ambulatory Visit: Payer: Self-pay | Admitting: Cardiovascular Disease

## 2014-11-11 ENCOUNTER — Other Ambulatory Visit: Payer: Self-pay | Admitting: Cardiovascular Disease

## 2014-11-11 NOTE — Telephone Encounter (Signed)
Rx(s) sent to pharmacy electronically. OV 11/14/14

## 2014-11-13 ENCOUNTER — Other Ambulatory Visit: Payer: Self-pay | Admitting: Cardiovascular Disease

## 2014-11-13 NOTE — Telephone Encounter (Signed)
Amlodipine refilled 11/11/14 - OV 1/28 with Dr. Salena Saner

## 2014-11-14 ENCOUNTER — Ambulatory Visit (INDEPENDENT_AMBULATORY_CARE_PROVIDER_SITE_OTHER): Payer: Medicare Other | Admitting: Cardiovascular Disease

## 2014-11-14 ENCOUNTER — Ambulatory Visit (INDEPENDENT_AMBULATORY_CARE_PROVIDER_SITE_OTHER): Payer: Medicare Other | Admitting: Pharmacist Clinician (PhC)/ Clinical Pharmacy Specialist

## 2014-11-14 ENCOUNTER — Other Ambulatory Visit: Payer: Self-pay | Admitting: Cardiovascular Disease

## 2014-11-14 ENCOUNTER — Ambulatory Visit
Admission: RE | Admit: 2014-11-14 | Discharge: 2014-11-14 | Disposition: A | Discharge status: Home / Self Care | Payer: Medicare Other | Source: Ambulatory Visit | Attending: Cardiovascular Disease | Admitting: Cardiovascular Disease

## 2014-11-14 ENCOUNTER — Encounter: Payer: Self-pay | Admitting: Cardiovascular Disease

## 2014-11-14 VITALS — BP 150/91 | HR 80 | Resp 16 | Ht 73.0 in | Wt 220.3 lb

## 2014-11-14 DIAGNOSIS — N183 Chronic kidney disease, stage 3 unspecified: Secondary | ICD-10-CM

## 2014-11-14 DIAGNOSIS — J189 Pneumonia, unspecified organism: Secondary | ICD-10-CM

## 2014-11-14 DIAGNOSIS — I82409 Acute embolism and thrombosis of unspecified deep veins of unspecified lower extremity: Secondary | ICD-10-CM

## 2014-11-14 DIAGNOSIS — J181 Lobar pneumonia, unspecified organism: Principal | ICD-10-CM

## 2014-11-14 DIAGNOSIS — I739 Peripheral vascular disease, unspecified: Secondary | ICD-10-CM

## 2014-11-14 DIAGNOSIS — J948 Other specified pleural conditions: Secondary | ICD-10-CM

## 2014-11-14 DIAGNOSIS — J90 Pleural effusion, not elsewhere classified: Secondary | ICD-10-CM | POA: Insufficient documentation

## 2014-11-14 DIAGNOSIS — I1310 Hypertensive heart and chronic kidney disease without heart failure, with stage 1 through stage 4 chronic kidney disease, or unspecified chronic kidney disease: Secondary | ICD-10-CM

## 2014-11-14 DIAGNOSIS — I701 Atherosclerosis of renal artery: Secondary | ICD-10-CM

## 2014-11-14 DIAGNOSIS — I7409 Other arterial embolism and thrombosis of abdominal aorta: Secondary | ICD-10-CM

## 2014-11-14 LAB — POCT INR: INR: 2.3

## 2014-11-14 MED ORDER — AMLODIPINE BESYLATE 2.5 MG PO TABS: 2.5000 mg | ORAL_TABLET | Freq: Every day | ORAL | Status: DC

## 2014-11-14 MED ORDER — NEBIVOLOL HCL 10 MG PO TABS: 10.0000 mg | ORAL_TABLET | Freq: Two times a day (BID) | ORAL | Status: DC

## 2014-11-14 MED ORDER — LOSARTAN POTASSIUM-HCTZ 100-25 MG PO TABS: 1.0000 | ORAL_TABLET | Freq: Every day | ORAL | Status: DC

## 2014-11-14 NOTE — Telephone Encounter (Signed)
Rx has been sent to the pharmacy electronically. ° °

## 2014-11-14 NOTE — Progress Notes (Signed)
Patient ID: Tony Zhang, male   DOB: August 18, 1946, 69 y.o.   MRN: [id number]      Reason for office visit Persistent pleuritic right chest pain and shortness of breath following episode of pneumonia Followup peripheral artery disease, malignant hypertension, mixed hyperlipidemia , abdominal aortic aneurysm and terminal aortic occlusion  [name] returns today for routine followup, but he was recently hospitalized for community-acquired right lung pneumonia that did not respond to outpatient therapy. Has been almost a month since his discharge from the hospital but he continues to have right-sided pleuritic chest pain, fatigue, low energy and worse than usual exertional dyspnea.  His usual complaints of intermittent claudication are unchanged. Blood pressure control has been fair. As always he has some symptoms of orthostatic dizziness but these are now expected and he takes the necessary precautions. He has not had any anginal chest pain or palpitations or abdominal pain or bleeding problems. He has severe PAD. He has a small abdominal aortic aneurysm in the infrarenal area and then the aorta is totally occluded above the iliac bifurcation. He has bilateral severely reduced ABIs of 0.7-0.8. There is collateral flow to the lower extremities only. He is suspected to have coronary disease, possible occlusion of the right coronary artery as per findings of previous nuclear stress testing. Since he is free of angina and has preserved left ventricular systolic function this is being managed medically.  Allergies  Allergen Reactions  . Codeine Hives    Takes benadryl to stop reaction  . Amlodipine Other (See Comments)    Muscle weakness    Current Outpatient Prescriptions  Medication Sig Dispense Refill  . allopurinol (ZYLOPRIM) 300 MG tablet Take 300 mg by mouth as needed.     [name] amLODipine (NORVASC) 2.5 MG tablet Take 1 tablet (2.5 mg total) by mouth daily. 30 tablet 6  . benazepril (LOTENSIN) 40  MG tablet Take 1 tablet (40 mg total) by mouth daily. MUST KEEP APPOINTMENT 11/14/2014 WITH DR Tony Zhang FOR FUTURE REFILLS. 15 tablet 0  . cilostazol (PLETAL) 100 MG tablet Take 1 tablet (100 mg total) by mouth 2 (two) times daily. 180 tablet 2  . fenofibrate 160 MG tablet Take 1 tablet (160 mg total) by mouth daily. 30 tablet 1  . HYDROcodone-acetaminophen (NORCO) 10-325 MG per tablet Take 1 tablet by mouth every 6 (six) hours as needed for moderate pain.     [name] ibuprofen (ADVIL,MOTRIN) 800 MG tablet 1 po bid prn (Patient taking differently: Take 800 mg by mouth 2 (two) times daily as needed. ) 60 tablet 3  . levofloxacin (LEVAQUIN) 750 MG tablet Take 1 tablet (750 mg total) by mouth daily. 5 tablet 0  . losartan-hydrochlorothiazide (HYZAAR) 100-25 MG per tablet Take 1 tablet by mouth daily. 90 tablet 0  . nebivolol (BYSTOLIC) 10 MG tablet Take 1 tablet (10 mg total) by mouth 2 (two) times daily. 60 tablet 6  . PROAIR HFA 108 (90 BASE) MCG/ACT inhaler Inhale 1 puff into the lungs as needed.     . simvastatin (ZOCOR) 40 MG tablet Take 1 tablet (40 mg total) by mouth daily at 6 PM. MUST KEEP APPOINTMENT FOR FUTURE REFILLS. 30 tablet 0  . warfarin (COUMADIN) 2.5 MG tablet Take 1.25-2.5 mg by mouth daily at 6 PM. Takes 1.25mg  on Wed and Fri Takes 2.5mg  all other days     No current facility-administered medications for this visit.    Past Medical History  Diagnosis Date  . Venous thrombosis  Recurrent  . Peripheral vascular disease   . Dyslipidemia   . AAA (abdominal aortic aneurysm)   . COPD (chronic obstructive pulmonary disease)   . Gout   . DJD (degenerative joint disease)   . Malignant hypertension   . Pneumonia 08/2013  . Shortness of breath     Past Surgical History  Procedure Laterality Date  . Back surgery    . Hernia repair    . Appendectomy    . Shoulder surgery Right     No family history on file.  History   Social History  . Marital Status: Divorced    Spouse  Name: N/A    Number of Children: N/A  . Years of Education: N/A   Occupational History  . Not on file.   Social History Main Topics  . Smoking status: Former Smoker    Quit date: 10/18/1992  . Smokeless tobacco: Never Used  . Alcohol Use: No  . Drug Use: No  . Sexual Activity: Not on file   Other Topics Concern  . Not on file   Social History Narrative    Review of systems: Mild cough, persistent right pleuritic chest pain, no fever or chills, greater than chronic level of shortness of breath with exertion, fatigue and weakness, no syncope, palpitations, lower extremity edema or abdominal complaints  PHYSICAL EXAM BP 150/91 mmHg  Pulse 80  Resp 16  Ht  (1.854 m)  Wt 220 lb 4.8 oz (99.927 kg)  BMI 29.07 kg/m2 General: Alert, oriented x3, no distress Head: no evidence of trauma, PERRL, EOMI, no exophtalmos or lid lag, no myxedema, no xanthelasma; normal ears, nose and oropharynx Neck: normal jugular venous pulsations and no hepatojugular reflux; brisk carotid pulses without delay and no carotid bruits Chest: Emphysematous changes, reduce breath sounds in the right lung base, dullness to percussion and egophony in the same area, splinting during deep breathing  Cardiovascular: normal position and quality of the apical impulse, regular rhythm, normal first and second heart sounds, no murmurs, rubs or gallops Abdomen: no tenderness or distention, no masses by palpation, no abnormal pulsatility or arterial bruits, normal bowel sounds, no hepatosplenomegaly Extremities: no clubbing, cyanosis or edema; 2+ radial, ulnar and brachial pulses bilaterally; 1+ right femoral, nonpalpable posterior tibial and dorsalis pedis pulses; 1+ left femoral, nonpalpable posterior tibial and dorsalis pedis pulses; no subclavian or femoral bruits Neurological: grossly nonfocal  EKG: 10/10/2014 sinus tachycardia with left anterior fascicular block  Lipid Panel     Component Value Date/Time    CHOL 153 09/17/2013 1135   TRIG 298* 09/17/2013 1135   HDL 18* 09/17/2013 1135   CHOLHDL 8.5 09/17/2013 1135   VLDL 60* 09/17/2013 1135   LDLCALC 75 09/17/2013 1135    BMET    Component Value Date/Time   NA 136 10/13/2014 0451   K 3.9 10/13/2014 0451   CL 102 10/13/2014 0451   CO2 25 10/13/2014 0451   GLUCOSE 126* 10/13/2014 0451   BUN 32* 10/13/2014 0451   CREATININE 1.20 10/13/2014 0451   CREATININE 1.56* 09/17/2013 1135   CALCIUM 8.9 10/13/2014 0451   GFRNONAA 60* 10/13/2014 0451   GFRAA 69* 10/13/2014 0451     ASSESSMENT AND PLAN  Despite one month following completion of treatment for pneumonia, Arville continues to have pleuritic chest pain and signs of consultation (probably combination of infiltrate and pleural effusion) in his right lung base. I recommended that he have a chest x-ray. This does show improvement compared with December but  there is still a fairly dense infiltrate and probably a small to moderate right pleural effusion. I recommended that he have a CT of the chest to make sure that this is not a postobstructive process or empyema.  His blood pressure control is not as good as in the past, but he is not feeling well. No changes are made to his antihypertensive medications. As far as I can tell there has been no change in the status of his peripheral arterial disease and does not have signs or symptoms of acute coronary insufficiency. His lipid profile performed last August and Dr. [name]'s office showed a total cholesterol 158, LDL 77 HDL of 24 and triglycerides of 284. I would not repeat this now since he is still acutely ill. Note that he has a creatinine that is harbored between 1.2 and 1.5 over the last 12 months and he should be carefully hydrated before CT angiography.  Since he has been on antibiotics and is not feeling well we checked his prothrombin time in the clinic today. His INR was in the therapeutic range. This is usually followed in Dr. Kalman DrapeSherrill's  hematology practice.  Orders Placed This Encounter  Procedures  . DG Chest 2 View   Meds ordered this encounter  Medications  . PROAIR HFA 108 (90 BASE) MCG/ACT inhaler    Sig: Inhale 1 puff into the lungs as needed.   [name]. allopurinol (ZYLOPRIM) 300 MG tablet    Sig: Take 300 mg by mouth as needed.   [name]. losartan-hydrochlorothiazide (HYZAAR) 100-25 MG per tablet    Sig: Take 1 tablet by mouth daily.    Dispense:  90 tablet    Refill:  0  . nebivolol (BYSTOLIC) 10 MG tablet    Sig: Take 1 tablet (10 mg total) by mouth 2 (two) times daily.    Dispense:  60 tablet    Refill:  6  . amLODipine (NORVASC) 2.5 MG tablet    Sig: Take 1 tablet (2.5 mg total) by mouth daily.    Dispense:  30 tablet    Refill:  6    Bexleigh Theriault  Thurmon FairMihai Rupert Azzara, MD, [facility] [phone] office [phone] pager

## 2014-11-14 NOTE — Telephone Encounter (Signed)
Pt still need his Benazepril 40 mg #30. Please call to [facility] [phone].

## 2014-11-14 NOTE — Patient Instructions (Signed)
A chest x-ray takes a picture of the organs and structures inside the chest, including the heart, lungs, and blood vessels. This test can show several things, including, whether the heart is enlarges; whether fluid is building up in the lungs; and whether pacemaker / defibrillator leads are still in place.   [facility]  [address]  - [facility]  1st floor.  Dr. Royann Shiversroitoru recommends that you schedule a follow-up appointment in:  6 months

## 2014-11-15 ENCOUNTER — Telehealth: Payer: Self-pay | Admitting: *Deleted

## 2014-11-15 DIAGNOSIS — R9389 Abnormal findings on diagnostic imaging of other specified body structures: Secondary | ICD-10-CM

## 2014-11-15 MED ORDER — BENAZEPRIL HCL 40 MG PO TABS: ORAL_TABLET | ORAL | Status: DC

## 2014-11-15 NOTE — Telephone Encounter (Signed)
Patient notified of CXR report and need for a chest CT with contrast.  Patient voiced understanding.  Order placed and appt scheduled for Monday 11/18/14 5pm.  Patient notified.

## 2014-11-15 NOTE — Telephone Encounter (Signed)
-----   Message from Thurmon FairMihai Croitoru, MD sent at 11/14/2014 12:59 PM EST ----- The chest X ray has improved since December, but should have improved more by now. Radiologist recommends a chest CT and I agree. Britta MccreedyBarbara, please order CT chest with contrast for persistent RLL infiltrate/ pleural effusion.

## 2014-11-18 ENCOUNTER — Ambulatory Visit
Admission: RE | Admit: 2014-11-18 | Discharge: 2014-11-18 | Disposition: A | Discharge status: Home / Self Care | Payer: Medicare Other | Source: Ambulatory Visit | Attending: Cardiovascular Disease | Admitting: Cardiovascular Disease

## 2014-11-18 MED ORDER — IOHEXOL 300 MG/ML  SOLN
75.0000 mL | Freq: Once | INTRAMUSCULAR | Status: AC | PRN
  Administered 2014-11-18: 75 mL via INTRAVENOUS

## 2014-11-19 ENCOUNTER — Telehealth: Payer: Self-pay | Admitting: *Deleted

## 2014-11-19 ENCOUNTER — Telehealth: Payer: Self-pay | Admitting: Internal Medicine

## 2014-11-19 DIAGNOSIS — J90 Pleural effusion, not elsewhere classified: Secondary | ICD-10-CM

## 2014-11-19 NOTE — Telephone Encounter (Signed)
Results of Chest CT called to patient.  Informed a referral has been placed to a pulmonary specialist and he will be receiving a call with an appt.  Patient voiced understanding.

## 2014-11-19 NOTE — Telephone Encounter (Signed)
Patient notified Dr. Salena Saner doesn't need him to see Dr. Sherene SiresWert.  Patient needs to be evaluated by a CV surgeon for removal of fluid.  Patient voiced understanding and will await a call from Dr. Dennie MaizesGerhardt's office.

## 2014-11-19 NOTE — Telephone Encounter (Signed)
-----   Message from Thurmon FairMihai Croitoru, MD sent at 11/18/2014  7:22 PM EST ----- He has a loculated pleural effusion and will probably need this drained, maybe even a small surgery to clear it up. Please give them a referral to pulmonary ASAP. Please forward this report to Dr. Aida PufferJames Little as well

## 2014-11-19 NOTE — Telephone Encounter (Signed)
Referral placed for an appt with Dr. Dennie MaizesGerhardt's office ASAP.

## 2014-11-20 ENCOUNTER — Other Ambulatory Visit: Payer: Medicare Other

## 2014-11-20 ENCOUNTER — Other Ambulatory Visit: Payer: Self-pay | Admitting: *Deleted

## 2014-11-20 ENCOUNTER — Encounter: Payer: Self-pay | Admitting: Cardiothoracic Surgery

## 2014-11-20 ENCOUNTER — Encounter: Payer: Self-pay | Admitting: Cardiovascular Disease

## 2014-11-20 ENCOUNTER — Ambulatory Visit (HOSPITAL_BASED_OUTPATIENT_CLINIC_OR_DEPARTMENT_OTHER): Payer: Self-pay | Admitting: Pharmacist

## 2014-11-20 ENCOUNTER — Telehealth: Payer: Self-pay | Admitting: *Deleted

## 2014-11-20 ENCOUNTER — Institutional Professional Consult (permissible substitution) (INDEPENDENT_AMBULATORY_CARE_PROVIDER_SITE_OTHER): Payer: Medicare Other | Admitting: Cardiothoracic Surgery

## 2014-11-20 VITALS — BP 157/90 | HR 80 | Resp 20 | Ht 73.0 in | Wt 220.0 lb

## 2014-11-20 DIAGNOSIS — I82409 Acute embolism and thrombosis of unspecified deep veins of unspecified lower extremity: Secondary | ICD-10-CM

## 2014-11-20 DIAGNOSIS — I82402 Acute embolism and thrombosis of unspecified deep veins of left lower extremity: Secondary | ICD-10-CM

## 2014-11-20 DIAGNOSIS — J90 Pleural effusion, not elsewhere classified: Secondary | ICD-10-CM

## 2014-11-20 DIAGNOSIS — I701 Atherosclerosis of renal artery: Secondary | ICD-10-CM

## 2014-11-20 DIAGNOSIS — R911 Solitary pulmonary nodule: Secondary | ICD-10-CM

## 2014-11-20 LAB — PROTIME-INR
INR: 2.8 (ref 2.00–3.50)
PROTIME: 33.6 s — AB (ref 10.6–13.4)

## 2014-11-20 LAB — POCT INR: INR: 2.8

## 2014-11-20 NOTE — Telephone Encounter (Signed)
Alycia RossettiRyan, RN  [phone]) from Dr. Dennie MaizesGerhardt's office asking "Okay with Dr. [name] for pt to hold Coumadin for bronchoscopy on Monday 11/25/14?"  Note to Dr. [name]

## 2014-11-20 NOTE — Progress Notes (Signed)
INR within goal today. Pt took coumadin as instructed. No missed or extra coumadin doses. No problems or concerns regarding anticoagulation. No unusual bruising. No bleeding noted. No s/s of clotting noted. No changes in medications or diet. Pt to be seen by Dr. Tyrone SageGerhardt today to evaluate removing fluid on his right lung (from PNA). He knows to let all providers know that he is on coumadin. He will let us know of any procedures. Continue 2.5 mg daily except 1.25 mg (1/2 tablet) on Wednesdays & Fridays. Recheck INR on 12/18/14 at  10:30am for lab and 10:45am for coumadin clinic.

## 2014-11-20 NOTE — Patient Instructions (Signed)
Continue 2.5 mg daily except 1.25 mg (1/2 tablet) on Wednesdays & Fridays. Recheck INR on 12/18/14 at  10:30am for lab and 10:45am for coumadin clinic.

## 2014-11-21 ENCOUNTER — Encounter (HOSPITAL_COMMUNITY): Payer: Self-pay | Admitting: Pharmacy Technician

## 2014-11-21 ENCOUNTER — Telehealth: Payer: Self-pay | Admitting: *Deleted

## 2014-11-21 NOTE — Telephone Encounter (Signed)
Returned call to [place] with Dr. Tyrone SageGerhardt: OK to hold Coumadin for bronchoscopy, per Dr. [name].

## 2014-11-22 ENCOUNTER — Encounter (HOSPITAL_COMMUNITY)
Admission: RE | Admit: 2014-11-22 | Discharge: 2014-11-22 | Disposition: A | Discharge status: Home / Self Care | Payer: Medicare Other | Source: Ambulatory Visit | Attending: Cardiothoracic Surgery | Admitting: Cardiothoracic Surgery

## 2014-11-22 ENCOUNTER — Other Ambulatory Visit: Payer: Self-pay

## 2014-11-22 ENCOUNTER — Encounter (HOSPITAL_COMMUNITY): Payer: Self-pay

## 2014-11-22 VITALS — BP 141/74 | HR 90 | Temp 99.1°F | Resp 20 | Ht 73.0 in | Wt 216.4 lb

## 2014-11-22 DIAGNOSIS — Z01818 Encounter for other preprocedural examination: Secondary | ICD-10-CM

## 2014-11-22 DIAGNOSIS — Z0183 Encounter for blood typing: Secondary | ICD-10-CM | POA: Insufficient documentation

## 2014-11-22 DIAGNOSIS — Z01812 Encounter for preprocedural laboratory examination: Secondary | ICD-10-CM | POA: Insufficient documentation

## 2014-11-22 DIAGNOSIS — Z7901 Long term (current) use of anticoagulants: Secondary | ICD-10-CM

## 2014-11-22 DIAGNOSIS — Z87891 Personal history of nicotine dependence: Secondary | ICD-10-CM

## 2014-11-22 DIAGNOSIS — J449 Chronic obstructive pulmonary disease, unspecified: Secondary | ICD-10-CM | POA: Insufficient documentation

## 2014-11-22 DIAGNOSIS — Z86718 Personal history of other venous thrombosis and embolism: Secondary | ICD-10-CM | POA: Insufficient documentation

## 2014-11-22 DIAGNOSIS — J90 Pleural effusion, not elsewhere classified: Secondary | ICD-10-CM

## 2014-11-22 DIAGNOSIS — I10 Essential (primary) hypertension: Secondary | ICD-10-CM

## 2014-11-22 DIAGNOSIS — R918 Other nonspecific abnormal finding of lung field: Secondary | ICD-10-CM

## 2014-11-22 HISTORY — DX: Disease of blood and blood-forming organs, unspecified: D75.9

## 2014-11-22 LAB — BLOOD GAS, ARTERIAL
Acid-base deficit: 2 mmol/L (ref 0.0–2.0)
Bicarbonate: 21.3 mEq/L (ref 20.0–24.0)
Drawn by: 42180
FIO2: 0.21 %
O2 Saturation: 95.7 %
Patient temperature: 98.6
TCO2: 22.2 mmol/L (ref 0–100)
pCO2 arterial: 30.2 mmHg — ABNORMAL LOW (ref 35.0–45.0)
pH, Arterial: 7.462 — ABNORMAL HIGH (ref 7.350–7.450)
pO2, Arterial: 75.8 mmHg — ABNORMAL LOW (ref 80.0–100.0)

## 2014-11-22 LAB — TYPE AND SCREEN
ABO/RH(D): B POS
Antibody Screen: NEGATIVE

## 2014-11-22 LAB — COMPREHENSIVE METABOLIC PANEL
ALT: 11 U/L (ref 0–53)
AST: 19 U/L (ref 0–37)
Albumin: 3.4 g/dL — ABNORMAL LOW (ref 3.5–5.2)
Alkaline Phosphatase: 71 U/L (ref 39–117)
Anion gap: 7 (ref 5–15)
BUN: 14 mg/dL (ref 6–23)
CO2: 23 mmol/L (ref 19–32)
Calcium: 9.4 mg/dL (ref 8.4–10.5)
Chloride: 105 mmol/L (ref 96–112)
Creatinine, Ser: 1.08 mg/dL (ref 0.50–1.35)
GFR calc Af Amer: 79 mL/min — ABNORMAL LOW (ref >90)
GFR calc non Af Amer: 68 mL/min — ABNORMAL LOW (ref >90)
Glucose, Bld: 118 mg/dL — ABNORMAL HIGH (ref 70–99)
Potassium: 3.4 mmol/L — ABNORMAL LOW (ref 3.5–5.1)
Sodium: 135 mmol/L (ref 135–145)
Total Bilirubin: 0.8 mg/dL (ref 0.3–1.2)
Total Protein: 7.6 g/dL (ref 6.0–8.3)

## 2014-11-22 LAB — CBC
HCT: 35.8 % — ABNORMAL LOW (ref 39.0–52.0)
Hemoglobin: 11.9 g/dL — ABNORMAL LOW (ref 13.0–17.0)
MCH: 27.3 pg (ref 26.0–34.0)
MCHC: 33.2 g/dL (ref 30.0–36.0)
MCV: 82.1 fL (ref 78.0–100.0)
Platelets: 367 10*3/uL (ref 150–400)
RBC: 4.36 MIL/uL (ref 4.22–5.81)
RDW: 15.9 % — ABNORMAL HIGH (ref 11.5–15.5)
WBC: 15.3 10*3/uL — ABNORMAL HIGH (ref 4.0–10.5)

## 2014-11-22 LAB — URINALYSIS, ROUTINE W REFLEX MICROSCOPIC
Glucose, UA: NEGATIVE mg/dL
Hgb urine dipstick: NEGATIVE
Ketones, ur: NEGATIVE mg/dL
Leukocytes, UA: NEGATIVE
Nitrite: NEGATIVE
Protein, ur: NEGATIVE mg/dL
Specific Gravity, Urine: 1.02 (ref 1.005–1.030)
Urobilinogen, UA: 2 mg/dL — ABNORMAL HIGH (ref 0.0–1.0)
pH: 6 (ref 5.0–8.0)

## 2014-11-22 LAB — PROTIME-INR
INR: 2.09 — ABNORMAL HIGH (ref 0.00–1.49)
Prothrombin Time: 23.7 seconds — ABNORMAL HIGH (ref 11.6–15.2)

## 2014-11-22 LAB — ABO/RH: ABO/RH(D): B POS

## 2014-11-22 LAB — SURGICAL PCR SCREEN
MRSA, PCR: NEGATIVE
Staphylococcus aureus: NEGATIVE

## 2014-11-22 LAB — APTT: aPTT: 48 seconds — ABNORMAL HIGH (ref 24–37)

## 2014-11-22 NOTE — Progress Notes (Signed)
Pt. Reports that he had a sleep study > 10 yrs. Ago, told that it was normal.

## 2014-11-22 NOTE — Progress Notes (Signed)
Call to A. Zelenak,PA-C, brief review of med. History.  She will further his record. Pt. Denies changes/ concerns in his chest, breathing is stable.

## 2014-11-22 NOTE — Pre-Procedure Instructions (Signed)
Tony Zhang  11/22/2014   Your procedure is scheduled on:  11/25/2014  [facility]    EN[facility] A    at 11:30 AM.  Call this number if you have problems the morning of surgery: [phone]   Remember:   Do not eat food or drink liquids after midnight. On Sunday   Take these medicines the morning of surgery with A SIP OF WATER:      TAKE BYSTOLIC------------ you can take pain medicine & use inhaler if needed    Do not wear jewelry   Do not wear lotions, powders, or perfumes. You may wear deodorant.    Men may shave face and neck.   Do not bring valuables to the hospital.  [facility] is not responsible                  for any belongings or valuables.               Contacts, dentures or bridgework may not be worn into surgery.   Leave suitcase in the car. After surgery it may be brought to your room.   For patients admitted to the hospital, discharge time is determined by your                treatment team.               Patients discharged the day of surgery will not be allowed to drive  home.  Name and phone number of your driver: on chart- Tony FriendlyDavid Zhang  Special Instructions: Special Instructions: [facility] - Pre[facility]  Before surgery, you can play an important role.  Because skin is not sterile, your skin needs to be as free of germs as possible.  You can reduce the number of germs on you skin by washing with CHG (chlorahexidine gluconate) soap before surgery.  CHG is an antiseptic cleaner which kills germs and bonds with the skin to continue killing germs even after washing.  Please DO NOT use if you have an allergy to CHG or antibacterial soaps.  If your skin becomes reddened/irritated stop using the CHG and inform your nurse when you arrive at Short Stay.  Do not shave (including legs and underarms) for at least 48 hours prior to the first CHG shower.  You may shave your face.  Please follow these instructions carefully:   1.   Shower with CHG Soap the night before surgery and the  morning of Surgery.  2.  If you choose to wash your hair, wash your hair first as usual with your  normal shampoo.  3.  After you shampoo, rinse your hair and body thoroughly to remove the  Shampoo.  4.  Use CHG as you would any other liquid soap.  You can apply chg directly to the skin and wash gently with scrungie or a clean washcloth.  5.  Apply the CHG Soap to your body ONLY FROM THE NECK DOWN.    Do not use on open wounds or open sores.  Avoid contact with your eyes, ears, mouth and genitals (private parts).  Wash genitals (private parts)   with your normal soap.  6.  Wash thoroughly, paying special attention to the area where your surgery will be performed.  7.  Thoroughly rinse your body with warm water from the neck down.  8.  DO NOT shower/wash with your normal soap after using and rinsing off   the  CHG Soap.  9.  Pat yourself dry with a clean towel.            10.  Wear clean pajamas.            11.  Place clean sheets on your bed the night of your first shower and do not sleep with pets.  Day of Surgery  Do not apply any lotions/deodorants the morning of surgery.  Please wear clean clothes to the hospital/surgery center.   Please read over the following fact sheets that you were given: Pain Booklet, Coughing and Deep Breathing, Blood Transfusion Information, MRSA Information and Surgical Site Infection Prevention

## 2014-11-22 NOTE — Progress Notes (Signed)
Anesthesia Chart Review: Patient is a 69 year old male scheduled for video bronchoscopy, right VATS, empyema drainage on 11/25/14 by Dr. Tyrone SageGerhardt.  Dr. Tyrone SageGerhardt saw patient on 11/20/14, but his note is not yet available in Epic.  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 [name]), dyslipidemia, gout, back surgery.  He is also followed by cardiologist Dr. Vickii Chaferotioru for malignant HTN, PAD, and suspected CAD (according to Dr. Erin Hearingroitoru's 11/14/14 note, "He is suspected to have coronary disease, possible occlusion of the right coronary artery as per findings of previous nuclear stress testing. Since he is free of angina and has preserved left ventricular systolic function this is being managed."  Dr. Vickii Chaferotioru is the one who referred patient to Dr. Tyrone SageGerhardt for surgical drainage empyema. PCP is listed as Dr. Aida PufferJames Little.   EKG 11/22/14: NSR, LAD, possible anterior infarct (age undetermined). He has had multiple EKGs.  Overall tracings appear [place]   No recent stress or echo noted in Epic.    11/22/14 CXR:  IMPRESSION: 1. Stable loculated right pleural effusion. 2. New right upper lobe airspace disease concerning for pneumonia. 3. 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  Preoperative labs noted.  WBC 15.3. PT/INR 23.7/2.09, PTT 48. Cr 1.08.  He is on Coumadin, on hold since 11/20/14. Dr. [name] gave permission to hold for surgery. He will need repeat PT/PTT on the day of surgery.   I routed CXR results with WBC results to Dr. Tyrone SageGerhardt and called them to Alycia Rossettiyan at his office.  May be expected findings with his underlying diagnosis.  I did not see  patient during her PAT visit, but the PAT RN did not appear acutely ill at his visit.  I also asked Alycia RossettiRyan to let patient know if Dr. Tyrone SageGerhardt would like him to hold Pletal prior to surgery.    Patient with stable EKG and recent cardiology visit.  Cardiologist is the one who referred patient for procedure.  He will get repeat labs on arrival and further evaluation by his surgeon and anesthesiologist. If no acute changes and labs are acceptable then I would anticipate that he could proceed as planned.  Velna Ochsllison Zelenak, PA-C [facility]/Anesthesiology Phone [phone] 11/22/2014 2:29 PM

## 2014-11-22 NOTE — Progress Notes (Signed)
PT. DENIES H/O HIM OR KNOWN FAMILY MEMBER EVER HAVING MALIGNANT HYPERTHERMIA.   PT. REPORTS THAT HE TOOK THE LAST DOSE OF COUMADIN ON 11/20/2014

## 2014-11-22 NOTE — Progress Notes (Signed)
   11/22/14 0940  OBSTRUCTIVE SLEEP APNEA  Have you ever been diagnosed with sleep apnea through a sleep study? No  Do you snore loudly (loud enough to be heard through closed doors)?  0  Do you often feel tired, fatigued, or sleepy during the daytime? 0  Has anyone observed you stop breathing during your sleep? 0  Do you have, or are you being treated for high blood pressure? 1  BMI more than 35 kg/m2? 0  Age over 70 years old? 1  Neck circumference greater than 40 cm/16 inches? 1  Gender: 1  Obstructive Sleep Apnea Score 4  Score 4 or greater  Results sent to PCP

## 2014-11-24 MED ORDER — DEXTROSE 5 % IV SOLN
1.5000 g | INTRAVENOUS | Status: AC
  Administered 2014-11-25: 1.5 g via INTRAVENOUS
  Filled 2014-11-24 (×2): qty 1.5

## 2014-11-25 ENCOUNTER — Inpatient Hospital Stay (HOSPITAL_COMMUNITY): Payer: Medicare Other

## 2014-11-25 ENCOUNTER — Inpatient Hospital Stay (HOSPITAL_COMMUNITY): Payer: Medicare Other | Admitting: Anesthesiology

## 2014-11-25 ENCOUNTER — Inpatient Hospital Stay (HOSPITAL_COMMUNITY)
Admission: RE | Admit: 2014-11-25 | Discharge: 2014-11-30 | DRG: 163 | Disposition: A | Discharge status: Home / Self Care | Payer: Medicare Other | Source: Ambulatory Visit | Attending: Cardiothoracic Surgery | Admitting: Cardiothoracic Surgery

## 2014-11-25 ENCOUNTER — Encounter (HOSPITAL_COMMUNITY)
Admission: RE | Disposition: A | Discharge status: Home / Self Care | Payer: Self-pay | Source: Ambulatory Visit | Attending: Cardiothoracic Surgery

## 2014-11-25 ENCOUNTER — Inpatient Hospital Stay (HOSPITAL_COMMUNITY): Payer: Medicare Other | Admitting: Vascular Surgery

## 2014-11-25 ENCOUNTER — Encounter (HOSPITAL_COMMUNITY): Payer: Self-pay | Admitting: *Deleted

## 2014-11-25 DIAGNOSIS — Z87891 Personal history of nicotine dependence: Secondary | ICD-10-CM | POA: Diagnosis not present

## 2014-11-25 DIAGNOSIS — Z9689 Presence of other specified functional implants: Secondary | ICD-10-CM

## 2014-11-25 DIAGNOSIS — J869 Pyothorax without fistula: Principal | ICD-10-CM | POA: Diagnosis present

## 2014-11-25 DIAGNOSIS — N182 Chronic kidney disease, stage 2 (mild): Secondary | ICD-10-CM | POA: Diagnosis present

## 2014-11-25 DIAGNOSIS — I129 Hypertensive chronic kidney disease with stage 1 through stage 4 chronic kidney disease, or unspecified chronic kidney disease: Secondary | ICD-10-CM | POA: Diagnosis present

## 2014-11-25 DIAGNOSIS — I739 Peripheral vascular disease, unspecified: Secondary | ICD-10-CM | POA: Diagnosis present

## 2014-11-25 DIAGNOSIS — Z86718 Personal history of other venous thrombosis and embolism: Secondary | ICD-10-CM

## 2014-11-25 DIAGNOSIS — I714 Abdominal aortic aneurysm, without rupture: Secondary | ICD-10-CM | POA: Diagnosis present

## 2014-11-25 DIAGNOSIS — M109 Gout, unspecified: Secondary | ICD-10-CM | POA: Diagnosis present

## 2014-11-25 DIAGNOSIS — E782 Mixed hyperlipidemia: Secondary | ICD-10-CM | POA: Diagnosis present

## 2014-11-25 DIAGNOSIS — Z8701 Personal history of pneumonia (recurrent): Secondary | ICD-10-CM

## 2014-11-25 DIAGNOSIS — J90 Pleural effusion, not elsewhere classified: Secondary | ICD-10-CM

## 2014-11-25 DIAGNOSIS — J939 Pneumothorax, unspecified: Secondary | ICD-10-CM | POA: Insufficient documentation

## 2014-11-25 DIAGNOSIS — Z79899 Other long term (current) drug therapy: Secondary | ICD-10-CM | POA: Diagnosis not present

## 2014-11-25 DIAGNOSIS — J449 Chronic obstructive pulmonary disease, unspecified: Secondary | ICD-10-CM | POA: Diagnosis present

## 2014-11-25 DIAGNOSIS — Z7901 Long term (current) use of anticoagulants: Secondary | ICD-10-CM | POA: Diagnosis not present

## 2014-11-25 DIAGNOSIS — J189 Pneumonia, unspecified organism: Secondary | ICD-10-CM | POA: Diagnosis present

## 2014-11-25 HISTORY — PX: VIDEO BRONCHOSCOPY: SHX5072

## 2014-11-25 HISTORY — PX: EMPYEMA DRAINAGE: SHX5097

## 2014-11-25 HISTORY — PX: VIDEO ASSISTED THORACOSCOPY: SHX5073

## 2014-11-25 LAB — BASIC METABOLIC PANEL
Anion gap: 7 (ref 5–15)
BUN: 12 mg/dL (ref 6–23)
CO2: 25 mmol/L (ref 19–32)
Calcium: 8 mg/dL — ABNORMAL LOW (ref 8.4–10.5)
Chloride: 102 mmol/L (ref 96–112)
Creatinine, Ser: 0.96 mg/dL (ref 0.50–1.35)
GFR calc Af Amer: >90 mL/min (ref >90)
GFR calc non Af Amer: 83 mL/min — ABNORMAL LOW (ref >90)
Glucose, Bld: 122 mg/dL — ABNORMAL HIGH (ref 70–99)
Potassium: 3.2 mmol/L — ABNORMAL LOW (ref 3.5–5.1)
Sodium: 134 mmol/L — ABNORMAL LOW (ref 135–145)

## 2014-11-25 LAB — CBC
HCT: 32.3 % — ABNORMAL LOW (ref 39.0–52.0)
Hemoglobin: 10.6 g/dL — ABNORMAL LOW (ref 13.0–17.0)
MCH: 27.2 pg (ref 26.0–34.0)
MCHC: 32.8 g/dL (ref 30.0–36.0)
MCV: 83 fL (ref 78.0–100.0)
Platelets: 317 10*3/uL (ref 150–400)
RBC: 3.89 MIL/uL — ABNORMAL LOW (ref 4.22–5.81)
RDW: 15.9 % — ABNORMAL HIGH (ref 11.5–15.5)
WBC: 11.4 10*3/uL — ABNORMAL HIGH (ref 4.0–10.5)

## 2014-11-25 LAB — PROTIME-INR
INR: 1.36 (ref 0.00–1.49)
Prothrombin Time: 16.9 seconds — ABNORMAL HIGH (ref 11.6–15.2)

## 2014-11-25 LAB — APTT: APTT: 37 s (ref 24–37)

## 2014-11-25 SURGERY — BRONCHOSCOPY, VIDEO-ASSISTED
Anesthesia: General | Site: Chest | Laterality: Right

## 2014-11-25 MED ORDER — FENTANYL CITRATE 0.05 MG/ML IJ SOLN
INTRAMUSCULAR | Status: AC
  Filled 2014-11-25: qty 5

## 2014-11-25 MED ORDER — ONDANSETRON HCL 4 MG/2ML IJ SOLN
4.0000 mg | Freq: Four times a day (QID) | INTRAMUSCULAR | Status: DC | PRN
  Administered 2014-11-25 – 2014-11-26 (×3): 4 mg via INTRAVENOUS
  Filled 2014-11-25 (×3): qty 2

## 2014-11-25 MED ORDER — PROPOFOL 10 MG/ML IV BOLUS
INTRAVENOUS | Status: AC
  Filled 2014-11-25: qty 20

## 2014-11-25 MED ORDER — FENOFIBRATE 160 MG PO TABS
160.0000 mg | ORAL_TABLET | Freq: Every day | ORAL | Status: DC
  Administered 2014-11-26 – 2014-11-30 (×5): 160 mg via ORAL
  Filled 2014-11-25 (×5): qty 1

## 2014-11-25 MED ORDER — DIPHENHYDRAMINE HCL 12.5 MG/5ML PO ELIX
12.5000 mg | ORAL_SOLUTION | Freq: Four times a day (QID) | ORAL | Status: DC | PRN
  Filled 2014-11-25: qty 5

## 2014-11-25 MED ORDER — NEOSTIGMINE METHYLSULFATE 10 MG/10ML IV SOLN
INTRAVENOUS | Status: DC | PRN
  Administered 2014-11-25: 4 mg via INTRAVENOUS

## 2014-11-25 MED ORDER — SODIUM CHLORIDE 0.9 % IJ SOLN: 9.0000 mL | INTRAMUSCULAR | Status: DC | PRN

## 2014-11-25 MED ORDER — GLYCOPYRROLATE 0.2 MG/ML IJ SOLN
INTRAMUSCULAR | Status: DC | PRN
  Administered 2014-11-25: 0.6 mg via INTRAVENOUS

## 2014-11-25 MED ORDER — FENTANYL 10 MCG/ML IV SOLN
INTRAVENOUS | Status: DC
  Administered 2014-11-25: 70 ug via INTRAVENOUS
  Administered 2014-11-25: 10 ug via INTRAVENOUS
  Administered 2014-11-25 – 2014-11-26 (×2): via INTRAVENOUS
  Administered 2014-11-26: 120 ug via INTRAVENOUS
  Administered 2014-11-26: 180 ug via INTRAVENOUS
  Administered 2014-11-26: 240 ug via INTRAVENOUS
  Administered 2014-11-26: 210 ug via INTRAVENOUS
  Administered 2014-11-27: 110 ug via INTRAVENOUS
  Administered 2014-11-27: 130 ug via INTRAVENOUS
  Administered 2014-11-27: 20 ug via INTRAVENOUS
  Administered 2014-11-27: 120 ug via INTRAVENOUS
  Administered 2014-11-27: 280 ug via INTRAVENOUS
  Administered 2014-11-27 – 2014-11-28 (×2): 80 ug via INTRAVENOUS
  Administered 2014-11-28: 07:00:00 via INTRAVENOUS
  Administered 2014-11-28: 123.5 ug via INTRAVENOUS
  Filled 2014-11-25 (×5): qty 50

## 2014-11-25 MED ORDER — HYDROMORPHONE HCL 1 MG/ML IJ SOLN
INTRAMUSCULAR | Status: AC
  Filled 2014-11-25: qty 1

## 2014-11-25 MED ORDER — NEOSTIGMINE METHYLSULFATE 10 MG/10ML IV SOLN
INTRAVENOUS | Status: AC
  Filled 2014-11-25: qty 2

## 2014-11-25 MED ORDER — SENNOSIDES-DOCUSATE SODIUM 8.6-50 MG PO TABS
1.0000 | ORAL_TABLET | Freq: Every day | ORAL | Status: DC
  Filled 2014-11-25 (×6): qty 1

## 2014-11-25 MED ORDER — MIDAZOLAM HCL 2 MG/2ML IJ SOLN: 0.5000 mg | Freq: Once | INTRAMUSCULAR | Status: DC | PRN

## 2014-11-25 MED ORDER — MIDAZOLAM HCL 2 MG/2ML IJ SOLN
INTRAMUSCULAR | Status: AC
  Filled 2014-11-25: qty 2

## 2014-11-25 MED ORDER — BISACODYL 5 MG PO TBEC
10.0000 mg | DELAYED_RELEASE_TABLET | Freq: Every day | ORAL | Status: DC
Start: 2014-11-25 — End: 2014-11-30
  Administered 2014-11-26 – 2014-11-28 (×2): 10 mg via ORAL
  Filled 2014-11-25 (×2): qty 2

## 2014-11-25 MED ORDER — ACETAMINOPHEN 160 MG/5ML PO SOLN
1000.0000 mg | Freq: Four times a day (QID) | ORAL | Status: DC
  Filled 2014-11-25: qty 40

## 2014-11-25 MED ORDER — VANCOMYCIN HCL 10 G IV SOLR
1250.0000 mg | Freq: Two times a day (BID) | INTRAVENOUS | Status: DC
  Administered 2014-11-25 – 2014-11-27 (×4): 1250 mg via INTRAVENOUS
  Filled 2014-11-25 (×5): qty 1250

## 2014-11-25 MED ORDER — PHENYLEPHRINE 40 MCG/ML (10ML) SYRINGE FOR IV PUSH (FOR BLOOD PRESSURE SUPPORT)
PREFILLED_SYRINGE | INTRAVENOUS | Status: AC
  Filled 2014-11-25: qty 10

## 2014-11-25 MED ORDER — PANTOPRAZOLE SODIUM 40 MG PO TBEC
40.0000 mg | DELAYED_RELEASE_TABLET | Freq: Every day | ORAL | Status: DC
  Administered 2014-11-26 – 2014-11-28 (×3): 40 mg via ORAL
  Filled 2014-11-25 (×3): qty 1

## 2014-11-25 MED ORDER — MIDAZOLAM HCL 5 MG/5ML IJ SOLN
INTRAMUSCULAR | Status: DC | PRN
  Administered 2014-11-25: 2 mg via INTRAVENOUS

## 2014-11-25 MED ORDER — NEBIVOLOL HCL 5 MG PO TABS
5.0000 mg | ORAL_TABLET | Freq: Two times a day (BID) | ORAL | Status: DC
  Filled 2014-11-25 (×2): qty 1

## 2014-11-25 MED ORDER — ROCURONIUM BROMIDE 100 MG/10ML IV SOLN
INTRAVENOUS | Status: DC | PRN
  Administered 2014-11-25: 10 mg via INTRAVENOUS
  Administered 2014-11-25: 50 mg via INTRAVENOUS

## 2014-11-25 MED ORDER — LABETALOL HCL 5 MG/ML IV SOLN: 10.0000 mg | INTRAVENOUS | Status: DC | PRN

## 2014-11-25 MED ORDER — 0.9 % SODIUM CHLORIDE (POUR BTL) OPTIME
TOPICAL | Status: DC | PRN
  Administered 2014-11-25: 2000 mL

## 2014-11-25 MED ORDER — HYDRALAZINE HCL 20 MG/ML IJ SOLN: 10.0000 mg | INTRAMUSCULAR | Status: DC | PRN

## 2014-11-25 MED ORDER — MEPERIDINE HCL 25 MG/ML IJ SOLN: 6.2500 mg | INTRAMUSCULAR | Status: DC | PRN

## 2014-11-25 MED ORDER — LIDOCAINE HCL (CARDIAC) 20 MG/ML IV SOLN
INTRAVENOUS | Status: DC | PRN
  Administered 2014-11-25: 20 mg via INTRAVENOUS

## 2014-11-25 MED ORDER — HYDROMORPHONE HCL 1 MG/ML IJ SOLN
0.2500 mg | INTRAMUSCULAR | Status: DC | PRN
  Administered 2014-11-25 (×3): 0.5 mg via INTRAVENOUS

## 2014-11-25 MED ORDER — ONDANSETRON HCL 4 MG/2ML IJ SOLN
INTRAMUSCULAR | Status: DC | PRN
  Administered 2014-11-25: 4 mg via INTRAVENOUS

## 2014-11-25 MED ORDER — PROMETHAZINE HCL 25 MG/ML IJ SOLN: 6.2500 mg | INTRAMUSCULAR | Status: DC | PRN

## 2014-11-25 MED ORDER — POTASSIUM CHLORIDE 10 MEQ/50ML IV SOLN
10.0000 meq | Freq: Every day | INTRAVENOUS | Status: DC | PRN
  Administered 2014-11-27: 10 meq via INTRAVENOUS
  Filled 2014-11-25 (×6): qty 50

## 2014-11-25 MED ORDER — KCL IN DEXTROSE-NACL 20-5-0.9 MEQ/L-%-% IV SOLN
INTRAVENOUS | Status: DC
  Administered 2014-11-25 – 2014-11-26 (×2): via INTRAVENOUS
  Filled 2014-11-25 (×4): qty 1000

## 2014-11-25 MED ORDER — ACETAMINOPHEN 500 MG PO TABS
1000.0000 mg | ORAL_TABLET | Freq: Four times a day (QID) | ORAL | Status: DC
  Administered 2014-11-25 – 2014-11-29 (×11): 1000 mg via ORAL
  Administered 2014-11-29: 500 mg via ORAL
  Filled 2014-11-25 (×20): qty 2

## 2014-11-25 MED ORDER — HYDROMORPHONE HCL 1 MG/ML IJ SOLN
INTRAMUSCULAR | Status: AC
  Administered 2014-11-25: 0.5 mg
  Filled 2014-11-25: qty 1

## 2014-11-25 MED ORDER — TRAMADOL HCL 50 MG PO TABS
50.0000 mg | ORAL_TABLET | Freq: Four times a day (QID) | ORAL | Status: DC | PRN
  Administered 2014-11-29: 100 mg via ORAL
  Filled 2014-11-25 (×2): qty 2

## 2014-11-25 MED ORDER — LACTATED RINGERS IV SOLN
INTRAVENOUS | Status: DC | PRN
  Administered 2014-11-25: 13:00:00 via INTRAVENOUS

## 2014-11-25 MED ORDER — CETYLPYRIDINIUM CHLORIDE 0.05 % MT LIQD
7.0000 mL | Freq: Two times a day (BID) | OROMUCOSAL | Status: DC
  Administered 2014-11-25 – 2014-11-28 (×7): 7 mL via OROMUCOSAL

## 2014-11-25 MED ORDER — BENAZEPRIL HCL 20 MG PO TABS: 20.0000 mg | ORAL_TABLET | Freq: Every day | ORAL | Status: DC

## 2014-11-25 MED ORDER — INSULIN ASPART 100 UNIT/ML ~~LOC~~ SOLN
0.0000 [IU] | Freq: Four times a day (QID) | SUBCUTANEOUS | Status: DC
  Administered 2014-11-25: 2 [IU] via SUBCUTANEOUS

## 2014-11-25 MED ORDER — IPRATROPIUM-ALBUTEROL 0.5-2.5 (3) MG/3ML IN SOLN: 3.0000 mL | RESPIRATORY_TRACT | Status: DC | PRN

## 2014-11-25 MED ORDER — SODIUM CHLORIDE 0.9 % IJ SOLN
10.0000 mL | Freq: Two times a day (BID) | INTRAMUSCULAR | Status: DC
  Administered 2014-11-25 – 2014-11-30 (×8): 10 mL

## 2014-11-25 MED ORDER — NALOXONE HCL 0.4 MG/ML IJ SOLN
0.4000 mg | INTRAMUSCULAR | Status: DC | PRN
  Filled 2014-11-25: qty 1

## 2014-11-25 MED ORDER — SIMVASTATIN 40 MG PO TABS
40.0000 mg | ORAL_TABLET | Freq: Every day | ORAL | Status: DC
  Administered 2014-11-26 – 2014-11-29 (×4): 40 mg via ORAL
  Filled 2014-11-25 (×5): qty 1

## 2014-11-25 MED ORDER — MIDAZOLAM HCL 2 MG/2ML IJ SOLN
INTRAMUSCULAR | Status: AC
  Administered 2014-11-25: 2 mg
  Filled 2014-11-25: qty 2

## 2014-11-25 MED ORDER — SODIUM CHLORIDE 0.9 % IJ SOLN: 10.0000 mL | INTRAMUSCULAR | Status: DC | PRN

## 2014-11-25 MED ORDER — PIPERACILLIN-TAZOBACTAM 3.375 G IVPB
3.3750 g | Freq: Three times a day (TID) | INTRAVENOUS | Status: DC
  Administered 2014-11-25 – 2014-11-28 (×9): 3.375 g via INTRAVENOUS
  Filled 2014-11-25 (×11): qty 50

## 2014-11-25 MED ORDER — DIPHENHYDRAMINE HCL 50 MG/ML IJ SOLN
12.5000 mg | Freq: Four times a day (QID) | INTRAMUSCULAR | Status: DC | PRN
  Filled 2014-11-25: qty 0.25

## 2014-11-25 MED ORDER — FENTANYL CITRATE 0.05 MG/ML IJ SOLN
INTRAMUSCULAR | Status: DC | PRN
  Administered 2014-11-25: 250 ug via INTRAVENOUS
  Administered 2014-11-25: 100 ug via INTRAVENOUS

## 2014-11-25 MED ORDER — PHENYLEPHRINE HCL 10 MG/ML IJ SOLN
INTRAMUSCULAR | Status: DC | PRN
  Administered 2014-11-25 (×4): 80 ug via INTRAVENOUS

## 2014-11-25 MED ORDER — POTASSIUM CHLORIDE 10 MEQ/50ML IV SOLN
10.0000 meq | INTRAVENOUS | Status: AC | PRN
  Administered 2014-11-25 – 2014-11-26 (×3): 10 meq via INTRAVENOUS

## 2014-11-25 MED ORDER — DEXTROSE 5 % IV SOLN
1.5000 g | Freq: Two times a day (BID) | INTRAVENOUS | Status: AC
  Administered 2014-11-25 – 2014-11-26 (×2): 1.5 g via INTRAVENOUS
  Filled 2014-11-25 (×2): qty 1.5

## 2014-11-25 MED ORDER — LACTATED RINGERS IV SOLN
INTRAVENOUS | Status: DC | PRN
  Administered 2014-11-25: 12:00:00 via INTRAVENOUS

## 2014-11-25 MED ORDER — PROPOFOL 10 MG/ML IV BOLUS
INTRAVENOUS | Status: DC | PRN
  Administered 2014-11-25: 130 mg via INTRAVENOUS

## 2014-11-25 MED ORDER — DEXAMETHASONE SODIUM PHOSPHATE 4 MG/ML IJ SOLN
INTRAMUSCULAR | Status: DC | PRN
  Administered 2014-11-25: 4 mg via INTRAVENOUS

## 2014-11-25 MED ORDER — FENTANYL CITRATE 0.05 MG/ML IJ SOLN
INTRAMUSCULAR | Status: AC
  Administered 2014-11-25: 100 ug
  Filled 2014-11-25: qty 2

## 2014-11-25 MED ORDER — ONDANSETRON HCL 4 MG/2ML IJ SOLN
4.0000 mg | Freq: Four times a day (QID) | INTRAMUSCULAR | Status: DC | PRN
  Filled 2014-11-25: qty 2

## 2014-11-25 SURGICAL SUPPLY — 80 items
ADH SKN CLS APL DERMABOND .7 (GAUZE/BANDAGES/DRESSINGS)
APL SRG 22X2 LUM MLBL SLNT (VASCULAR PRODUCTS)
APL SRG 7X2 LUM MLBL SLNT (VASCULAR PRODUCTS)
APPLICATOR TIP COSEAL (VASCULAR PRODUCTS) IMPLANT
APPLICATOR TIP EXT COSEAL (VASCULAR PRODUCTS) IMPLANT
BLADE SURG 11 STRL SS (BLADE) IMPLANT
BRUSH CYTOL CELLEBRITY 1.5X140 (MISCELLANEOUS) IMPLANT
CANISTER SUCTION 2500CC (MISCELLANEOUS) ×4 IMPLANT
CATH KIT ON Q 5IN SLV (PAIN MANAGEMENT) IMPLANT
CATH THORACIC 28FR (CATHETERS) IMPLANT
CATH THORACIC 36FR (CATHETERS) IMPLANT
CATH THORACIC 36FR RT ANG (CATHETERS) IMPLANT
CLEANER TIP ELECTROSURG 2X2 (MISCELLANEOUS) ×3 IMPLANT
CLIP TI MEDIUM 6 (CLIP) IMPLANT
CONT SPEC 4OZ CLIKSEAL STRL BL (MISCELLANEOUS) ×6 IMPLANT
COVER TABLE BACK 60X90 (DRAPES) ×2 IMPLANT
DERMABOND ADVANCED (GAUZE/BANDAGES/DRESSINGS)
DERMABOND ADVANCED .7 DNX12 (GAUZE/BANDAGES/DRESSINGS) IMPLANT
DRAIN CHANNEL 28F RND 3/8 FF (WOUND CARE) ×2 IMPLANT
DRAPE LAPAROSCOPIC ABDOMINAL (DRAPES) ×3 IMPLANT
DRAPE WARM FLUID 44X44 (DRAPE) ×2 IMPLANT
DRILL BIT 7/64X5 (BIT) IMPLANT
ELECT BLADE 4.0 EZ CLEAN MEGAD (MISCELLANEOUS) ×3
ELECT REM PT RETURN 9FT ADLT (ELECTROSURGICAL) ×3
ELECTRODE BLDE 4.0 EZ CLN MEGD (MISCELLANEOUS) ×2 IMPLANT
ELECTRODE REM PT RTRN 9FT ADLT (ELECTROSURGICAL) ×2 IMPLANT
FORCEPS BIOP RJ4 1.8 (CUTTING FORCEPS) IMPLANT
GAUZE SPONGE 4X4 12PLY STRL (GAUZE/BANDAGES/DRESSINGS) ×3 IMPLANT
GLOVE BIO SURGEON STRL SZ 6.5 (GLOVE) ×6 IMPLANT
GOWN STRL REUS W/ TWL LRG LVL3 (GOWN DISPOSABLE) ×6 IMPLANT
GOWN STRL REUS W/TWL LRG LVL3 (GOWN DISPOSABLE) ×9
HANDPIECE INTERPULSE COAX TIP (DISPOSABLE) ×3
KIT BASIN OR (CUSTOM PROCEDURE TRAY) ×3 IMPLANT
KIT CLEAN ENDO COMPLIANCE (KITS) ×3 IMPLANT
KIT ROOM TURNOVER OR (KITS) ×3 IMPLANT
KIT SUCTION CATH 14FR (SUCTIONS) ×3 IMPLANT
LIQUID BAND (GAUZE/BANDAGES/DRESSINGS) ×2 IMPLANT
MARKER SKIN DUAL TIP RULER LAB (MISCELLANEOUS) ×3 IMPLANT
NDL BIOPSY TRANSBRONCH 21G (NEEDLE) IMPLANT
NEEDLE BIOPSY TRANSBRONCH 21G (NEEDLE) IMPLANT
NS IRRIG 1000ML POUR BTL (IV SOLUTION) ×6 IMPLANT
OIL SILICONE PENTAX (PARTS (SERVICE/REPAIRS)) ×3 IMPLANT
PACK CHEST (CUSTOM PROCEDURE TRAY) ×3 IMPLANT
PAD ARMBOARD 7.5X6 YLW CONV (MISCELLANEOUS) ×6 IMPLANT
PASSER SUT SWANSON 36MM LOOP (INSTRUMENTS) IMPLANT
SEALANT PROGEL (MISCELLANEOUS) IMPLANT
SEALANT SURG COSEAL 4ML (VASCULAR PRODUCTS) IMPLANT
SEALANT SURG COSEAL 8ML (VASCULAR PRODUCTS) IMPLANT
SET HNDPC FAN SPRY TIP SCT (DISPOSABLE) IMPLANT
SOLUTION ANTI FOG 6CC (MISCELLANEOUS) ×3 IMPLANT
SPONGE GAUZE 4X4 12PLY STER LF (GAUZE/BANDAGES/DRESSINGS) ×1 IMPLANT
SUT PROLENE 3 0 SH DA (SUTURE) IMPLANT
SUT PROLENE 4 0 RB 1 (SUTURE)
SUT PROLENE 4-0 RB1 .5 CRCL 36 (SUTURE) IMPLANT
SUT SILK  1 MH (SUTURE) ×2
SUT SILK 0 TIES 10X30 (SUTURE) ×1 IMPLANT
SUT SILK 1 MH (SUTURE) ×4 IMPLANT
SUT SILK 2 0 TIES 10X30 (SUTURE) ×1 IMPLANT
SUT SILK 2 0SH CR/8 30 (SUTURE) IMPLANT
SUT SILK 3 0SH CR/8 30 (SUTURE) IMPLANT
SUT VIC AB 1 CTX 18 (SUTURE) ×1 IMPLANT
SUT VIC AB 1 CTX 36 (SUTURE) ×3
SUT VIC AB 1 CTX36XBRD ANBCTR (SUTURE) IMPLANT
SUT VIC AB 2-0 CTX 36 (SUTURE) ×1 IMPLANT
SUT VIC AB 3-0 X1 27 (SUTURE) ×1 IMPLANT
SUT VICRYL 2 TP 1 (SUTURE) IMPLANT
SWAB COLLECTION DEVICE MRSA (MISCELLANEOUS) IMPLANT
SYR 20ML ECCENTRIC (SYRINGE) ×3 IMPLANT
SYSTEM SAHARA CHEST DRAIN ATS (WOUND CARE) ×3 IMPLANT
TAPE CLOTH 4X10 WHT NS (GAUZE/BANDAGES/DRESSINGS) ×3 IMPLANT
TIP APPLICATOR SPRAY EXTEND 16 (VASCULAR PRODUCTS) IMPLANT
TOWEL OR 17X24 6PK STRL BLUE (TOWEL DISPOSABLE) ×3 IMPLANT
TOWEL OR 17X26 10 PK STRL BLUE (TOWEL DISPOSABLE) ×2 IMPLANT
TRAP SPECIMEN MUCOUS 40CC (MISCELLANEOUS) ×3 IMPLANT
TRAY FOLEY BAG SILVER LF 16FR (CATHETERS) ×2 IMPLANT
TRAY FOLEY CATH 14FRSI W/METER (CATHETERS) ×1 IMPLANT
TROCAR XCEL BLUNT TIP 100MML (ENDOMECHANICALS) IMPLANT
TUBE ANAEROBIC SPECIMEN COL (MISCELLANEOUS) IMPLANT
TUBE CONNECTING 20X1/4 (TUBING) ×5 IMPLANT
WATER STERILE IRR 1000ML POUR (IV SOLUTION) ×4 IMPLANT

## 2014-11-25 NOTE — Transfer of Care (Signed)
Immediate Anesthesia Transfer of Care Note  Patient: Guerry MinorsJerry F Brock  Procedure(s) Performed: Procedure(s): VIDEO BRONCHOSCOPY (N/A) VIDEO ASSISTED THORACOSCOPY (Right) EMPYEMA DRAINAGE (N/A)  Patient Location: PACU  Anesthesia Type:General  Level of Consciousness: awake, alert , oriented and patient cooperative  Airway & Oxygen Therapy: Patient Spontanous Breathing and Patient connected to nasal cannula oxygen  Post-op Assessment: Report given to RN and Post -op Vital signs reviewed and stable  Post vital signs: Reviewed and stable  Last Vitals:  Filed Vitals:   11/25/14 1513  BP:   Pulse:   Temp: 36.9 C  Resp:     Complications: No apparent anesthesia complications

## 2014-11-25 NOTE — Consult Note (Signed)
PULMONARY / CRITICAL CARE MEDICINE   Name: Tony Zhang MRN: [id number] DOB: 1946-03-19    ADMISSION DATE:  11/25/2014 CONSULTATION DATE:  11/25/2014  REFERRING MD :  Ofilia Neas  CHIEF COMPLAINT:  Empyema  INITIAL PRESENTATION:  69 yo male former smoker tx for PNA in December 2015 developed loculated Rt pleural effusion with pleurisy >> admitted for Rt VATS.  STUDIES:  2/01 CT chest >> moderate centrilobular/paraseptal emphysema, loculated Rt pleural effusion, 7 mm nodule RML  SIGNIFICANT EVENTS: 2/08 Rt VATS  HISTORY OF PRESENT ILLNESS:   69 yo male was treated for pneumonia in December 2015.  He went to see his cardiologist in January.  He continue to have Rt pleuritic chest pain.  He had CT chest which showed Rt loculated pleural effusion.  He was referred to thoracic surgery, and had VATS decortication on Rt earlier today.  He was seen in PACU after surgery.  He was successfully extubated.  He has soreness in Rt chest and feels thirsty.  He denies abdominal pain.  PAST MEDICAL HISTORY :   has a past medical history of Venous thrombosis; Peripheral vascular disease; Dyslipidemia; AAA (abdominal aortic aneurysm); COPD (chronic obstructive pulmonary disease); Gout; DJD (degenerative joint disease); Malignant hypertension; Shortness of breath; Pneumonia (08/2013); and Blood dyscrasia.  has past surgical history that includes Back surgery; Hernia repair; Appendectomy; Shoulder surgery (Right); and Eye surgery. Prior to Admission medications   Medication Sig Start Date End Date Taking? Authorizing Provider  benazepril (LOTENSIN) 40 MG tablet Take one tablet daily Patient taking differently: Take 40 mg by mouth daily.  11/15/14  Yes Mihai Croitoru, MD  cilostazol (PLETAL) 100 MG tablet Take 1 tablet (100 mg total) by mouth 2 (two) times daily. 02/04/14  Yes Mihai Croitoru, MD  fenofibrate 160 MG tablet Take 1 tablet (160 mg total) by mouth daily. Patient taking differently: Take 160 mg by  mouth at bedtime.  09/30/14  Yes Mihai Croitoru, MD  HYDROcodone-acetaminophen (NORCO) 10-325 MG per tablet Take 1 tablet by mouth every 4 (four) hours as needed for moderate pain.  09/19/14  Yes Historical Provider, MD  ibuprofen (ADVIL,MOTRIN) 800 MG tablet Take 800 mg by mouth every 8 (eight) hours as needed for moderate pain.   Yes Historical Provider, MD  losartan-hydrochlorothiazide (HYZAAR) 100-25 MG per tablet Take 1 tablet by mouth daily. 11/14/14  Yes Mihai Croitoru, MD  nebivolol (BYSTOLIC) 10 MG tablet Take 1 tablet (10 mg total) by mouth 2 (two) times daily. 11/14/14  Yes Mihai Croitoru, MD  nebivolol (BYSTOLIC) 2.5 MG tablet Take 2.5 mg by mouth daily. EVERY AFTERNOON, 3 PM   Yes Historical Provider, MD  PROAIR HFA 108 (90 BASE) MCG/ACT inhaler Inhale 3-4 puffs into the lungs every 4 (four) hours as needed for wheezing or shortness of breath.  10/31/14  Yes Historical Provider, MD  simvastatin (ZOCOR) 40 MG tablet Take 1 tablet (40 mg total) by mouth daily at 6 PM. MUST KEEP APPOINTMENT FOR FUTURE REFILLS. 10/08/14  Yes Mihai Croitoru, MD  warfarin (COUMADIN) 2.5 MG tablet Take 1.25-2.5 mg by mouth daily at 6 PM. Takes 1.25mg  on Wed and Fri Takes 2.5mg  all other days   Yes Historical Provider, MD   Allergies  Allergen Reactions  . Codeine Hives and Other (See Comments)    Takes benadryl to stop reaction  . Amlodipine Other (See Comments)    Muscle weakness    FAMILY HISTORY:  has no family status information on file.  SOCIAL HISTORY:  reports that he quit smoking about 22 years ago. He has never used smokeless tobacco. He reports that he does not drink alcohol or use illicit drugs.  REVIEW OF SYSTEMS:   Negative except above.  SUBJECTIVE:   VITAL SIGNS: Temp:  [98.4 F (36.9 C)-98.5 F (36.9 C)] 98.4 F (36.9 C) (02/08 1513) Pulse Rate:  [75-90] 75 (02/08 1615) Resp:  [15-22] 18 (02/08 1615) BP: (163-193)/(75-91) 163/75 mmHg (02/08 1515) SpO2:  [91 %-99 %] 93 % (02/08  1615) Arterial Line BP: (112-180)/(45-65) 112/45 mmHg (02/08 1615) Weight:  [216 lb 6.4 oz (98.158 kg)] 216 lb 6.4 oz (98.158 kg) (02/08 1201) INTAKE / OUTPUT:  Intake/Output Summary (Last 24 hours) at 11/25/14 1622 Last data filed at 11/25/14 1519  Gross per 24 hour  Intake   1900 ml  Output    400 ml  Net   1500 ml    PHYSICAL EXAMINATION: General: no distress Neuro: sleepy, wakes up easily, follows commands, normal strength HEENT: no sinus tenderness, Rt IJ site clean Cardiovascular:  Regular, no murmur Lungs: Rt chest tube in place, scattered rhonchi with decreased BS Rt > Lt base Abdomen:  Soft, non tender Musculoskeletal:  No edema Skin:  No rashes  LABS:  CBC  Recent Labs Lab 11/22/14 1026  WBC 15.3*  HGB 11.9*  HCT 35.8*  PLT 367   Coag's  Recent Labs Lab 11/20/14 1036 11/22/14 1026 11/25/14 1134  APTT  --  48* 37  INR 2.80 2.09* 1.36   BMET  Recent Labs Lab 11/22/14 1026  NA 135  K 3.4*  CL 105  CO2 23  BUN 14  CREATININE 1.08  GLUCOSE 118*   Electrolytes  Recent Labs Lab 11/22/14 1026  CALCIUM 9.4   ABG  Recent Labs Lab 11/22/14 1026  PHART 7.462*  PCO2ART 30.2*  PO2ART 75.8*   Liver Enzymes  Recent Labs Lab 11/22/14 1026  AST 19  ALT 11  ALKPHOS 71  BILITOT 0.8  ALBUMIN 3.4*   Glucose No results for input(s): GLUCAP in the last 168 hours.  Imaging Dg Chest Port 1 View  11/25/2014   CLINICAL DATA:  Followup pneumothorax.  EXAM: PORTABLE CHEST - 1 VIEW  COMPARISON:  11/22/2014 Chest x-ray and 11/18/2014 chest CT  FINDINGS: The right IJ catheter is in good position in the mid SVC. The heart is mildly enlarged. There is tortuosity and calcification of the thoracic aorta. Stable underlying emphysematous changes and pulmonary scarring. Persistent right-sided pleural effusion with PleurX drainage catheter in place.  IMPRESSION: Persistent small right pleural effusion.  Chronic emphysematous changes and pulmonary scarring.    Electronically Signed   By: Loralie ChampagneMark  Gallerani M.D.   On: 11/25/2014 16:11      ASSESSMENT / PLAN:  PULMONARY Rt chest tube 2/08 >> A: COPD/Emphysema with hx of tobacco abuse. S/p VATS for empyema 2/08. 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 Hx of refractory HTN, PAD, HLD, AAA. P:  Monitor hemodynamics Resume lotensin, pletal, fenofibrinate, losartan, HCTZ, bystolic, zocor when able to take oral medications PRN hydralazine, labetalol for SBP > 170 Defer when okay to resume anti-coagulation to TCTS  RENAL A:   Stage 2 CKD. P:   Monitor renal fx, urine outpt, electrolytes  GASTROINTESTINAL A:   Nutrition.  P:   Advance diet per primary team  HEMATOLOGIC A:   No acute issues. P:  F/u CBC SCD's  INFECTIOUS A:   Rt empyema after PNA in December 2015. P:   Day 1 vancomycin, zosyn pending cx results  Rt pleural fluid 2/08 >> Rt pleural fluid AFB 2/08 >> Rt pleural fluid fungal cx 2/08 >>  ENDOCRINE A:   Hx of gout. P:   Monitor clinically  NEUROLOGIC A:  Post-op pain control. P:   Dilaudid PCA  Coralyn Helling, MD [facility] Pulmonary/Critical Care 11/25/2014, 4:22 PM Pager:  [phone] After 3pm call: [phone]

## 2014-11-25 NOTE — Anesthesia Procedure Notes (Signed)
Anesthesia Procedure Note CVP: Timeout, sterile prep, drape, FBP R neck.  Trendelenburg position.  1% lido local, finder and trocar RIJ 1st pass with US guidance.  2 lumen placed over J wire. Biopatch and sterile dressing on.  Patient tolerated well.  VSS.  Jenita Seashore, MD  [phone]

## 2014-11-25 NOTE — Anesthesia Postprocedure Evaluation (Signed)
  Anesthesia Post-op Note  Patient: Guerry MinorsJerry F Dearman  Procedure(s) Performed: Procedure(s): VIDEO BRONCHOSCOPY (N/A) VIDEO ASSISTED THORACOSCOPY (Right) EMPYEMA DRAINAGE (N/A)  Patient Location: PACU  Anesthesia Type:General  Level of Consciousness: awake, alert , oriented and patient cooperative  Airway and Oxygen Therapy: Patient Spontanous Breathing and Patient connected to nasal cannula oxygen  Post-op Pain: none  Post-op Assessment: Post-op Vital signs reviewed, Patient's Cardiovascular Status Stable, Respiratory Function Stable, Patent Airway, No signs of Nausea or vomiting and Pain level controlled  Post-op Vital Signs: Reviewed and stable  Last Vitals:  Filed Vitals:   11/25/14 1645  BP:   Pulse: 72  Temp:   Resp: 15    Complications: No apparent anesthesia complications

## 2014-11-25 NOTE — Progress Notes (Signed)
Patient ID: Tony Zhang, male   DOB: 1946/07/19, 69 y.o.   MRN: [id number] EVENING ROUNDS NOTE :     [address]       [place] [id number]             [phone]                 Day of Surgery Procedure(s) (LRB): VIDEO BRONCHOSCOPY (N/A) VIDEO ASSISTED THORACOSCOPY (Right) EMPYEMA DRAINAGE (N/A)  Total Length of Stay:  LOS: 0 days  BP 163/75 mmHg  Pulse 72  Temp(Src) 98.1 F (36.7 C) (Oral)  Resp 13  Wt 216 lb 6.4 oz (98.158 kg)  SpO2 96%  .Intake/Output      02/07 0701 - 02/08 0700 02/08 0701 - 02/09 0700   I.V. (mL/kg)  1900 (19.4)   Total Intake(mL/kg)  1900 (19.4)   Urine (mL/kg/hr)  695   Blood  80   Chest Tube  65   Total Output   840   Net   +1060          . dextrose 5 % and 0.9 % NaCl with KCl 20 mEq/L 100 mL/hr at 11/25/14 1813     Lab Results  Component Value Date   WBC 15.3* 11/22/2014   HGB 11.9* 11/22/2014   HCT 35.8* 11/22/2014   PLT 367 11/22/2014   GLUCOSE 118* 11/22/2014   CHOL 153 09/17/2013   TRIG 298* 09/17/2013   HDL 18* 09/17/2013   LDLCALC 75 09/17/2013   ALT 11 11/22/2014   AST 19 11/22/2014   NA 135 11/22/2014   K 3.4* 11/22/2014   CL 105 11/22/2014   CREATININE 1.08 11/22/2014   BUN 14 11/22/2014   CO2 23 11/22/2014   TSH 2.226 06/25/2009   INR 1.36 11/25/2014   Stable in ICU, good resp effort Dg Chest Port 1 View  11/25/2014   CLINICAL DATA:  Followup pneumothorax.  EXAM: PORTABLE CHEST - 1 VIEW  COMPARISON:  11/22/2014 Chest x-ray and 11/18/2014 chest CT  FINDINGS: The right IJ catheter is in good position in the mid SVC. The heart is mildly enlarged. There is tortuosity and calcification of the thoracic aorta. Stable underlying emphysematous changes and pulmonary scarring. Persistent right-sided pleural effusion with PleurX drainage catheter in place.  IMPRESSION: Persistent small right pleural effusion.  Chronic emphysematous changes and pulmonary scarring.   Electronically Signed   By: Loralie ChampagneMark  Gallerani M.D.    On: 11/25/2014 16:11   Delight OvensEdward B Ilyaas Musto MD  Beeper [phone] Office [phone] 11/25/2014 6:29 PM

## 2014-11-25 NOTE — Progress Notes (Signed)
     Rt VATS

## 2014-11-25 NOTE — Anesthesia Preprocedure Evaluation (Addendum)
Anesthesia Evaluation  Patient identified by MRN, date of birth, ID band Patient awake    Reviewed: Allergy & Precautions, NPO status , Patient's Chart, lab work & pertinent test results, reviewed documented beta blocker date and time   History of Anesthesia Complications Negative for: history of anesthetic complications  Airway Mallampati: II  TM Distance: >3 FB Neck ROM: Full    Dental  (+) Edentulous Upper, Edentulous Lower   Pulmonary COPD COPD inhaler, former smoker (quit 1994),  Loculated pleural effusion/empyema breath sounds clear to auscultation        Cardiovascular hypertension, Pt. on medications and Pt. on home beta blockers - angina+ Peripheral Vascular Disease (AAA, renal artery stenosis) and DVT Rhythm:Regular Rate:Normal     Neuro/Psych negative neurological ROS     GI/Hepatic Neg liver ROS, GERD-  Poorly Controlled,  Endo/Other  negative endocrine ROS  Renal/GU Renal InsufficiencyRenal disease     Musculoskeletal   Abdominal   Peds  Hematology  (+) Blood dyscrasia (Hb 11.9, coumadin 1.36), ,   Anesthesia Other Findings   Reproductive/Obstetrics                          Anesthesia Physical Anesthesia Plan  ASA: III  Anesthesia Plan: General   Post-op Pain Management:    Induction: Intravenous  Airway Management Planned: Oral ETT and Double Lumen EBT  Additional Equipment: Arterial line, CVP and Ultrasound Guidance Line Placement  Intra-op Plan:   Post-operative Plan: Extubation in OR  Informed Consent: I have reviewed the patients History and Physical, chart, labs and discussed the procedure including the risks, benefits and alternatives for the proposed anesthesia with the patient or authorized representative who has indicated his/her understanding and acceptance.     Plan Discussed with: CRNA and Surgeon  Anesthesia Plan Comments: (Plan routine monitors, A  line, CVP, GETA with double lumen ETT)        Anesthesia Quick Evaluation

## 2014-11-25 NOTE — Progress Notes (Signed)
ANTIBIOTIC CONSULT NOTE - INITIAL  Pharmacy Consult for vancomycin and zosyn Indication: empyema  Allergies  Allergen Reactions  . Codeine Hives and Other (See Comments)    Takes benadryl to stop reaction  . Amlodipine Other (See Comments)    Muscle weakness    Patient Measurements: Weight: 216 lb 6.4 oz (98.158 kg) Adjusted Body Weight:   Vital Signs: Temp: 98.4 F (36.9 C) (02/08 1513) Temp Source: Oral (02/08 1201) BP: 163/75 mmHg (02/08 1515) Pulse Rate: 72 (02/08 1645) Intake/Output from previous day:   Intake/Output from this shift: Total I/O In: 1900 [I.V.:1900] Out: 600 [Urine:470; Blood:80; Chest Tube:50]  Labs: No results for input(s): WBC, HGB, PLT, LABCREA, CREATININE in the last 72 hours. Estimated Creatinine Clearance: 79.6 mL/min (by C-G formula based on Cr of 1.08). No results for input(s): VANCOTROUGH, VANCOPEAK, VANCORANDOM, GENTTROUGH, GENTPEAK, GENTRANDOM, TOBRATROUGH, TOBRAPEAK, TOBRARND, AMIKACINPEAK, AMIKACINTROU, AMIKACIN in the last 72 hours.   Microbiology: Recent Results (from the past 720 hour(s))  Surgical pcr screen     Status: None   Collection Time: 11/22/14 10:26 AM  Result Value Ref Range Status   MRSA, PCR NEGATIVE NEGATIVE Final   Staphylococcus aureus NEGATIVE NEGATIVE Final    Comment:        The Xpert SA Assay (FDA approved for NASAL specimens in patients over 70 years of age), is one component of a comprehensive surveillance program.  Test performance has been validated by [facility] for patients greater than or equal to 35103 year old. It is not intended to diagnose infection nor to guide or monitor treatment.     Medical History: Past Medical History  Diagnosis Date  . Venous thrombosis     Recurrent  . Peripheral vascular disease   . Dyslipidemia   . AAA (abdominal aortic aneurysm)   . COPD (chronic obstructive pulmonary disease)   . Gout   . DJD (degenerative joint disease)   . Malignant hypertension   .  Shortness of breath   . Pneumonia 08/2013    pt. reports that he is having this surgery 11/25/2014- for the lung problem that began with pneumonia in 09/2014  . Blood dyscrasia     followed by Dr. [name], for clotting issue, has been on Coumadin for about 20 yrs.      Medications:  Scheduled:  . fentaNYL   Intravenous 6 times per day  . HYDROmorphone       Infusions:   Assessment: 69 yo male with empyema will be started on vancomycin and zosyn.  CrCl is ~79.  Goal of Therapy:  Vancomycin trough level 15-20 mcg/ml  Plan:  - vancomycin 1250 mg iv q12h - zosyn 3.375g iv q8h (4hr infusion) - monitor renal function and check vancomycin trough when it's appropriate  Trecia Maring, Tsz-Yin 11/25/2014,5:17 PM

## 2014-11-25 NOTE — H&P (Signed)
[address]       [place] [address]             [phone]                    Tony Zhang [facility] Medical Record #[id number] Date of Birth: May 24, 1946  Referring: Dr Royann Shivers  Primary Care: [name], MD  Chief Complaint:    Fluid on right lung   History of Present Illness:    Tony Zhang 69 y.o. male is seen in the office   for Persistent pleuritic right chest pain and shortness of breath following episode of pneumonia. Follow up ct scan done by cardiology He has history of  peripheral artery disease, malignant hypertension, mixed hyperlipidemia , abdominal aortic aneurysm and terminal aortic occlusion.   He  was recently hospitalized for community-acquired right lung pneumonia that did not respond to outpatient therapy. Has been almost a month since his discharge from the hospital but he continues to have right-sided pleuritic chest pain, fatigue, low energy and worse than usual exertional dyspnea.  He has chronic  complaints of intermittent claudication. Blood pressure control has been fair. As always he has some symptoms of orthostatic dizziness but these are now expected and he takes the necessary precautions. He has not had any anginal chest pain or palpitations or abdominal pain or bleeding problems. He has severe PAD. He has a small abdominal aortic aneurysm in the infrarenal area and then the aorta is totally occluded above the iliac bifurcation. He has bilateral severely reduced ABIs of 0.7-0.8. There is collateral flow to the lower extremities only. He is suspected to have coronary disease, possible occlusion of the right coronary artery as per findings of previous nuclear stress testing. Since he is free of angina and has preserved left ventricular systolic function this is being managed medically.       Current Activity/ Functional Status:  Patient is independent with mobility/ambulation, transfers, ADL's, IADL's.   Zubrod  Score: At the time of surgery this patient's most appropriate activity status/level should be described as:     0    Normal activity, no symptoms     1    Restricted in physical strenuous activity but ambulatory, able to do out light work     2    Ambulatory and capable of self care, unable to do work activities, up and about               >50 % of waking hours                                  3    Only limited self care, in bed greater than 50% of waking hours     4    Completely disabled, no self care, confined to bed or chair     5    Moribund   Past Medical History  Diagnosis Date  . Venous thrombosis     Recurrent  . Peripheral vascular disease   . Dyslipidemia   . AAA (abdominal aortic aneurysm)   . COPD (chronic obstructive pulmonary disease)   . Gout   . DJD (degenerative joint disease)   . Malignant hypertension   . Shortness of breath   . Pneumonia 08/2013    pt. reports that he is having this surgery 11/25/2014- for the lung problem that began  with pneumonia in 09/2014  . Blood dyscrasia     followed by Dr. [name], for clotting issue, has been on Coumadin for about 20 yrs.      Past Surgical History  Procedure Laterality Date  . Back surgery    . Hernia repair    . Appendectomy    . Shoulder surgery Right   . Eye surgery      both eyes, cataracts removed, denies lens implants    History reviewed. No pertinent family history.  History   Social History  . Marital Status: Divorced    Spouse Name: N/A    Number of Children: N/A  . Years of Education: N/A   Occupational History  . 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 Smoking status: Former Smoker    Quit date: 10/18/1992  . Smokeless tobacco: Never Used  . Alcohol Use: No  . Drug Use: No  . Sexual Activity: Not on file   Other Topics Concern  . Not on file   Social History Narrative    History  Smoking status  . Former Smoker  .  Quit date: 10/18/1992  Smokeless tobacco  . Never Used    History  Alcohol Use No     Allergies  Allergen Reactions  . Codeine Hives and Other (See Comments)    Takes benadryl to stop reaction  . Amlodipine Other (See Comments)    Muscle weakness    Current Facility-Administered Medications  Medication Dose Route Frequency Provider Last Rate Last Dose  . cefUROXime (ZINACEF) 1.5 g in dextrose 5 % 50 mL IVPB  1.5 g Intravenous 60 min Pre-Op Delight OvensEdward B Opaline Reyburn, MD      . fentaNYL (SUBLIMAZE) 0.05 MG/ML injection           . midazolam (VERSED) 2 MG/2ML injection               Review of Systems:     Cardiac Review of Systems: Y or N  Chest Pain [ y   ]  Resting SOB [   ] Exertional SOB  [  y]  Orthopnea [ n ]   Pedal Edema [  n ]    Palpitations [ n ] Syncope  [n  ]   Presyncope [ y  ]  General Review of Systems: [Y] = yes [  ]=no Constitional: recent weight change [  ];  Wt loss over the last 3 months [   ] anorexia [  ]; fatigue [  ]; nausea [  ]; night sweats [  ]; fever [  ]; or chills [  ];          Dental: poor dentition[  ]; Last Dentist visit:   Eye : blurred vision [  ]; diplopia [   ]; vision changes [  ];  Amaurosis fugax[  ]; Resp: cough [  ];  wheezing[  ];  hemoptysis[  ]; shortness of breath[  ]; paroxysmal nocturnal dyspnea[  ]; dyspnea on exertion[  ]; or orthopnea[  ];  GI:  gallstones[  ], vomiting[  ];  dysphagia[  ]; melena[  ];  hematochezia [  ]; heartburn[  ];   Hx of  Colonoscopy[  ]; GU: kidney stones [  ]; hematuria[  ];   dysuria [  ];  nocturia[  ];  history of     obstruction [  ]; urinary frequency [  ]  Skin: rash, swelling[  ];, hair loss[  ];  peripheral edema[  ];  or itching[  ]; Musculosketetal: myalgias[  ];  joint swelling[  ];  joint erythema[  ];  joint pain[  ];  back pain[  ];  Heme/Lymph: bruising[  ];  bleeding[  ];  anemia[  ];  Neuro: TIA[  ];  headaches[  ];  stroke[  ];  vertigo[  ];  seizures[  ];   paresthesias[  ];   difficulty walking[n  ];  Psych:depression[  ]; anxiety[  ];  Endocrine: diabetes[  ];  thyroid dysfunction[  ];  Immunizations: Flu up to date Cove.Etienne  ]; Pneumococcal up to date [  y];  Other:  Physical Exam: BP 193/91 mmHg  Pulse 82  Temp(Src) 98.5 F (36.9 C) (Oral)  Resp 18  Wt 216 lb 6.4 oz (98.158 kg)  SpO2 94%  PHYSICAL EXAMINATION: General appearance: alert and cooperative Head: Normocephalic, without obvious abnormality, atraumatic Neck: no adenopathy, no carotid bruit, no JVD, supple, symmetrical, trachea midline and thyroid not enlarged, symmetric, no tenderness/mass/nodules Lymph nodes: Cervical, supraclavicular, and axillary nodes normal. Resp: diminished breath sounds RLL and RML Back: symmetric, no curvature. ROM normal. No CVA tenderness. Cardio: regular rate and rhythm, S1, S2 normal, no murmur, click, rub or gallop GI: soft, non-tender; bowel sounds normal; no masses,  no organomegaly Extremities: extremities normal, atraumatic, no cyanosis or edema, Homans sign is negative, no sign of DVT and decreased pedal pulses  Neurologic: Alert and oriented X 3, normal strength and tone. Normal symmetric reflexes. Normal coordination and gait  Diagnostic Studies & Laboratory data:     Recent Radiology Findings:   Dg Chest 2 View  11/22/2014   CLINICAL DATA:  Loculated pleural effusion.  EXAM: CHEST  2 VIEW  COMPARISON:  11/18/2014 CT chest, 11/14/2014  FINDINGS: There is a stable loculated right pleural effusion. There is a new right upper lobe airspace disease. There is stable left lower lobe airspace disease. There is no pneumothorax. The heart and mediastinum are stable. There is no acute osseous abnormality.  IMPRESSION: 1. Stable loculated right pleural effusion. 2. New right upper lobe airspace disease concerning for pneumonia. 3. 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   Electronically Signed   By: Elige Ko   On: 11/22/2014 11:06   Dg Chest 2  View  11/14/2014   CLINICAL DATA:  Diagnosis of right lower lobe pneumonia in December 2015. , persistent shortness of breath and right-sided chest pain; history of COPD.  EXAM: CHEST  2 VIEW  COMPARISON:  PA and lateral chest x-rays of October 10, 2014 and September 09, 2013.  FINDINGS: There has been some improvement in the appearance of the right lung base. However, there is a persistent small right pleural effusion and there is patchy interstitial density within the lower lobe. On the left there is a small amount of pleural fluid versus pleural thickening blunting the lateral costophrenic angle. This is stable. The heart and pulmonary vascularity appear normal. The mediastinum is normal in width. There is no pleural effusion.  IMPRESSION: There has been mild interval improvement in the appearance of the right lung base but significant abnormalities remain. Further evaluation with chest CT scanning is recommended.   Electronically Signed   By: David  [place]   On: 11/14/2014 11:29   Ct Chest W Contrast  11/18/2014   CLINICAL DATA:  Shortness of breath. Recent hospitalization for pneumonia.  EXAM: CT CHEST WITH CONTRAST  TECHNIQUE: Multidetector CT imaging of the chest was performed during intravenous contrast administration.  CONTRAST:  75mL OMNIPAQUE IOHEXOL 300 MG/ML  SOLN  COMPARISON:  11/15/2007  FINDINGS: Mediastinum: The heart size appears normal. There is no pericardial effusion. Calcified atherosclerotic disease involves the thoracic aorta. The transverse aortic arch measures 4.0 cm in maximum AP dimension, image 99 of series 401. The ascending aorta measures 3.7 cm. The trachea appears patent and is midline. Normal appearance of the esophagus. Right paratracheal lymph node is borderline enlarged measuring 1 cm, image 22/series 2. There is a right hilar lymph node which measures 1.5 cm, image 37/series 2.  Lungs/Pleura: Moderate changes of centrilobular and paraseptal emphysema. There is a loculated  right pleural effusion at the right base. This measures 12.2 by 5.8 cm, image 45/series 2. Overlying platelike atelectasis is identified. Scar noted in the left lower lobe. In the right middle lobe there is a nodule measuring 7 mm, image 34/series 3. New from previous exam. Also in the right middle lobe is a subpleural nodule measuring 7 mm, image 35/series 3. This is unchanged from 2009.  Upper Abdomen: Visualized portions of the adrenal glands and spleen are normal. Low-attenuation structure within segment 5 of the liver measures 1.3 cm, image 54/series 2. This is unchanged from 2009 and is compatible with a benign abnormality.  Musculoskeletal: There are no aggressive lytic or sclerotic bone lesions identified.  IMPRESSION: 1. Loculated right pleural effusion may represent and empyema. Consider further evaluation with diagnostic thoracentesis. 2. Enlarged right hilar and right paratracheal lymph nodes are nonspecific and may be reactive in the setting of infection. 3. Pulmonary nodule in the right middle lobe measures 7 mm and is new from previous exam. If the patient is at high risk for bronchogenic carcinoma, follow-up chest CT at 3-5months is recommended. If the patient is at low risk for bronchogenic carcinoma, follow-up chest CT at 6-12 months is recommended. This recommendation follows the consensus statement: Guidelines for Management of Small Pulmonary Nodules Detected on CT Scans: A Statement from the Fleischner Society as published in Radiology 2005; 237:395-400. 4. Diffuse bronchial wall thickening with emphysema, as above; imaging findings suggestive of underlying COPD. 5. Atherosclerotic disease. 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 Recommend semi-annual imaging followup by CTA or MRA and referral to cardiothoracic surgery if not already obtained. This recommendation follows 2010 ACCF/AHA/AATS/ACR/ASA/SCA/SCAI/SIR/STS/SVM Guidelines for the Diagnosis and Management of Patients With  Thoracic Aortic Disease. Circulation. 2010; 121: [phone]   Electronically Signed   By: Signa Kell M.D.   On: 11/18/2014 17:56     I have independently reviewed the above radiologic studies.  Recent Lab Findings: Lab Results  Component Value Date   WBC 15.3* 11/22/2014   HGB 11.9* 11/22/2014   HCT 35.8* 11/22/2014   PLT 367 11/22/2014   GLUCOSE 118* 11/22/2014   CHOL 153 09/17/2013   TRIG 298* 09/17/2013   HDL 18* 09/17/2013   LDLCALC 75 09/17/2013   ALT 11 11/22/2014   AST 19 11/22/2014   NA 135 11/22/2014   K 3.4* 11/22/2014   CL 105 11/22/2014   CREATININE 1.08 11/22/2014   BUN 14 11/22/2014   CO2 23 11/22/2014   TSH 2.226 06/25/2009   INR 1.36 11/25/2014      Assessment / Plan:   I have recommended to patient proceeding with right VATS for drainage of complex loculated effusion following pneumonia Risks and options discussed coumadin & Plental on hold  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  Pulmonary nodule in the right middle lobe measures 7 mm   The goals risks and alternatives of the planned surgical procedure  Bronchoscopy and right VATS drainage of empyema   have been discussed with the patient in detail. The risks of the procedure including death, infection, stroke, myocardial infarction, bleeding, blood transfusion have all been discussed specifically.  I have quoted Tony Zhang a 4 % of perioperative mortality and a complication rate as high as 25 %. The patient's questions have been answered.Tony Zhang is willing  to proceed with the planned procedure.  Delight Ovens MD      [address] [place].[address] [place] [address] Office [phone]   Beeper [phone]  11/25/2014 12:39 PM

## 2014-11-25 NOTE — Progress Notes (Signed)
[address]       [place] [address]             [phone]                    KARIM AIELLO [facility] Medical Record #[id number] Date of Birth: 05/23/1946  Referring: [name], MD Primary Care: [name], MD  Chief Complaint:    Chief Complaint  Patient presents with  . Pleural Effusion    Surgical evel on Right loculated pleural effusion, Chest CT 11/18/2014    History of Present Illness:    Tony Zhang 69 y.o. male is seen in the office   for Persistent pleuritic right chest pain and shortness of breath following episode of pneumonia. Follow up ct scan done by cardiology He has history of  peripheral artery disease, malignant hypertension, mixed hyperlipidemia , abdominal aortic aneurysm and terminal aortic occlusion.   He  was recently hospitalized for community-acquired right lung pneumonia that did not respond to outpatient therapy. Has been almost a month since his discharge from the hospital but he continues to have right-sided pleuritic chest pain, fatigue, low energy and worse than usual exertional dyspnea.  He has chronic  complaints of intermittent claudication. Blood pressure control has been fair. As always he has some symptoms of orthostatic dizziness but these are now expected and he takes the necessary precautions. He has not had any anginal chest pain or palpitations or abdominal pain or bleeding problems. He has severe PAD. He has a small abdominal aortic aneurysm in the infrarenal area and then the aorta is totally occluded above the iliac bifurcation. He has bilateral severely reduced ABIs of 0.7-0.8. There is collateral flow to the lower extremities only. He is suspected to have coronary disease, possible occlusion of the right coronary artery as per findings of previous nuclear stress testing. Since he is free of angina and has preserved left ventricular systolic function this is being managed medically.       Current  Activity/ Functional Status:  Patient is independent with mobility/ambulation, transfers, ADL's, IADL's.   Zubrod Score: At the time of surgery this patient's most appropriate activity status/level should be described as: []     0    Normal activity, no symptoms [x]     1    Restricted in physical strenuous activity but ambulatory, able to do out light work []     2    Ambulatory and capable of self care, unable to do work activities, up and about               >50 % of waking hours                              []     3    Only limited self care, in bed greater than 50% of waking hours []     4    Completely disabled, no self care, confined to bed or chair []     5    Moribund   Past Medical History  Diagnosis Date  . Venous thrombosis     Recurrent  . Peripheral vascular disease   . Dyslipidemia   . AAA (abdominal aortic aneurysm)   . COPD (chronic obstructive pulmonary disease)   . Gout   . DJD (degenerative joint disease)   . Malignant hypertension   . Shortness of breath   .  Pneumonia 08/2013    pt. reports that he is having this surgery 11/25/2014- for the lung problem that began with pneumonia in 09/2014  . Blood dyscrasia     followed by Dr. [name], for clotting issue, has been on Coumadin for about 20 yrs.      Past Surgical History  Procedure Laterality Date  . Back surgery    . Hernia repair    . Appendectomy    . Shoulder surgery Right   . Eye surgery      both eyes, cataracts removed, denies lens implants    No family history on file.  History   Social History  . Marital Status: Divorced    Spouse Name: N/A    Number of Children: N/A  . Years of Education: N/A   Occupational History  . Not on file.   Social History Main Topics  . Smoking status: Former Smoker    Quit date: 10/18/1992  . Smokeless tobacco: Never Used  . Alcohol Use: No  . Drug Use: No  . Sexual Activity: Not on file   Other Topics Concern  . Not on file   Social History  Narrative    History  Smoking status  . Former Smoker  . Quit date: 10/18/1992  Smokeless tobacco  . Never Used    History  Alcohol Use No     Allergies  Allergen Reactions  . Codeine Hives and Other (See Comments)    Takes benadryl to stop reaction  . Amlodipine Other (See Comments)    Muscle weakness    Current Outpatient Prescriptions  Medication Sig Dispense Refill  . benazepril (LOTENSIN) 40 MG tablet Take one tablet daily (Patient taking differently: Take 40 mg by mouth daily. ) 90 tablet 3  . cilostazol (PLETAL) 100 MG tablet Take 1 tablet (100 mg total) by mouth 2 (two) times daily. 180 tablet 2  . fenofibrate 160 MG tablet Take 1 tablet (160 mg total) by mouth daily. (Patient taking differently: Take 160 mg by mouth at bedtime. ) 30 tablet 1  . HYDROcodone-acetaminophen (NORCO) 10-325 MG per tablet Take 1 tablet by mouth every 4 (four) hours as needed for moderate pain.     [name]. losartan-hydrochlorothiazide (HYZAAR) 100-25 MG per tablet Take 1 tablet by mouth daily. 90 tablet 0  . nebivolol (BYSTOLIC) 10 MG tablet Take 1 tablet (10 mg total) by mouth 2 (two) times daily. 60 tablet 6  . PROAIR HFA 108 (90 BASE) MCG/ACT inhaler Inhale 3-4 puffs into the lungs every 4 (four) hours as needed for wheezing or shortness of breath.     . simvastatin (ZOCOR) 40 MG tablet Take 1 tablet (40 mg total) by mouth daily at 6 PM. MUST KEEP APPOINTMENT FOR FUTURE REFILLS. 30 tablet 0  . warfarin (COUMADIN) 2.5 MG tablet Take 1.25-2.5 mg by mouth daily at 6 PM. Takes 1.25mg  on Wed and Fri Takes 2.5mg  all other days    . ibuprofen (ADVIL,MOTRIN) 800 MG tablet Take 800 mg by mouth every 8 (eight) hours as needed for moderate pain.    . nebivolol (BYSTOLIC) 2.5 MG tablet Take 2.5 mg by mouth daily. EVERY AFTERNOON, 3 PM     No current facility-administered medications for this visit.   Facility-Administered Medications Ordered in Other Visits  Medication Dose Route Frequency Provider Last  Rate Last Dose  . cefUROXime (ZINACEF) 1.5 g in dextrose 5 % 50 mL IVPB  1.5 g Intravenous 60 min Pre-Op Delight OvensEdward B Deanne Bedgood, MD  Review of Systems:     Cardiac Review of Systems: Y or N  Chest Pain [ y   ]  Resting SOB [   ] Exertional SOB  [  y]  Orthopnea [ n ]   Pedal Edema [  n ]    Palpitations [ n ] Syncope  [n  ]   Presyncope [ y  ]  General Review of Systems: [Y] = yes [  ]=no Constitional: recent weight change [  ];  Wt loss over the last 3 months [   ] anorexia [  ]; fatigue [  ]; nausea [  ]; night sweats [  ]; fever [  ]; or chills [  ];          Dental: poor dentition[  ]; Last Dentist visit:   Eye : blurred vision [  ]; diplopia [   ]; vision changes [  ];  Amaurosis fugax[  ]; Resp: cough [  ];  wheezing[  ];  hemoptysis[  ]; shortness of breath[  ]; paroxysmal nocturnal dyspnea[  ]; dyspnea on exertion[  ]; or orthopnea[  ];  GI:  gallstones[  ], vomiting[  ];  dysphagia[  ]; melena[  ];  hematochezia [  ]; heartburn[  ];   Hx of  Colonoscopy[  ]; GU: kidney stones [  ]; hematuria[  ];   dysuria [  ];  nocturia[  ];  history of     obstruction [  ]; urinary frequency [  ]             Skin: rash, swelling[  ];, hair loss[  ];  peripheral edema[  ];  or itching[  ]; Musculosketetal: myalgias[  ];  joint swelling[  ];  joint erythema[  ];  joint pain[  ];  back pain[  ];  Heme/Lymph: bruising[  ];  bleeding[  ];  anemia[  ];  Neuro: TIA[  ];  headaches[  ];  stroke[  ];  vertigo[  ];  seizures[  ];   paresthesias[  ];  difficulty walking[  ];  Psych:depression[  ]; anxiety[  ];  Endocrine: diabetes[  ];  thyroid dysfunction[  ];  Immunizations: Flu up to date [  ]; Pneumococcal up to date [  ];  Other:  Physical Exam: BP 157/90 mmHg  Pulse 80  Resp 20  Ht  (1.854 m)  Wt 220 lb (99.791 kg)  BMI 29.03 kg/m2  SpO2 96%  PHYSICAL EXAMINATION: General appearance: alert and cooperative Head: Normocephalic, without obvious abnormality, atraumatic Neck: no  adenopathy, no carotid bruit, no JVD, supple, symmetrical, trachea midline and thyroid not enlarged, symmetric, no tenderness/mass/nodules Lymph nodes: Cervical, supraclavicular, and axillary nodes normal. Resp: diminished breath sounds RLL and RML Back: symmetric, no curvature. ROM normal. No CVA tenderness. Cardio: regular rate and rhythm, S1, S2 normal, no murmur, click, rub or gallop GI: soft, non-tender; bowel sounds normal; no masses,  no organomegaly Extremities: extremities normal, atraumatic, no cyanosis or edema, Homans sign is negative, no sign of DVT and decreased pedal pulses  Neurologic: Alert and oriented X 3, normal strength and tone. Normal symmetric reflexes. Normal coordination and gait  Diagnostic Studies & Laboratory data:     Recent Radiology Findings:   Dg Chest 2 View  11/22/2014   CLINICAL DATA:  Loculated pleural effusion.  EXAM: CHEST  2 VIEW  COMPARISON:  11/18/2014 CT chest, 11/14/2014  FINDINGS: There is a stable loculated right pleural  effusion. There is a new right upper lobe airspace disease. There is stable left lower lobe airspace disease. There is no pneumothorax. The heart and mediastinum are stable. There is no acute osseous abnormality.  IMPRESSION: 1. Stable loculated right pleural effusion. 2. New right upper lobe airspace disease concerning for pneumonia. 3. 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   Electronically Signed   By: Elige Ko   On: 11/22/2014 11:06   Dg Chest 2 View  11/14/2014   CLINICAL DATA:  Diagnosis of right lower lobe pneumonia in December 2015. , persistent shortness of breath and right-sided chest pain; history of COPD.  EXAM: CHEST  2 VIEW  COMPARISON:  PA and lateral chest x-rays of October 10, 2014 and September 09, 2013.  FINDINGS: There has been some improvement in the appearance of the right lung base. However, there is a persistent small right pleural effusion and there is patchy interstitial density within the lower  lobe. On the left there is a small amount of pleural fluid versus pleural thickening blunting the lateral costophrenic angle. This is stable. The heart and pulmonary vascularity appear normal. The mediastinum is normal in width. There is no pleural effusion.  IMPRESSION: There has been mild interval improvement in the appearance of the right lung base but significant abnormalities remain. Further evaluation with chest CT scanning is recommended.   Electronically Signed   By: David  [place]   On: 11/14/2014 11:29   Ct Chest W Contrast  11/18/2014   CLINICAL DATA:  Shortness of breath. Recent hospitalization for pneumonia.  EXAM: CT CHEST WITH CONTRAST  TECHNIQUE: Multidetector CT imaging of the chest was performed during intravenous contrast administration.  CONTRAST:  75mL OMNIPAQUE IOHEXOL 300 MG/ML  SOLN  COMPARISON:  11/15/2007  FINDINGS: Mediastinum: The heart size appears normal. There is no pericardial effusion. Calcified atherosclerotic disease involves the thoracic aorta. The transverse aortic arch measures 4.0 cm in maximum AP dimension, image 99 of series 401. The ascending aorta measures 3.7 cm. The trachea appears patent and is midline. Normal appearance of the esophagus. Right paratracheal lymph node is borderline enlarged measuring 1 cm, image 22/series 2. There is a right hilar lymph node which measures 1.5 cm, image 37/series 2.  Lungs/Pleura: Moderate changes of centrilobular and paraseptal emphysema. There is a loculated right pleural effusion at the right base. This measures 12.2 by 5.8 cm, image 45/series 2. Overlying platelike atelectasis is identified. Scar noted in the left lower lobe. In the right middle lobe there is a nodule measuring 7 mm, image 34/series 3. New from previous exam. Also in the right middle lobe is a subpleural nodule measuring 7 mm, image 35/series 3. This is unchanged from 2009.  Upper Abdomen: Visualized portions of the adrenal glands and spleen are normal.  Low-attenuation structure within segment 5 of the liver measures 1.3 cm, image 54/series 2. This is unchanged from 2009 and is compatible with a benign abnormality.  Musculoskeletal: There are no aggressive lytic or sclerotic bone lesions identified.  IMPRESSION: 1. Loculated right pleural effusion may represent and empyema. Consider further evaluation with diagnostic thoracentesis. 2. Enlarged right hilar and right paratracheal lymph nodes are nonspecific and may be reactive in the setting of infection. 3. Pulmonary nodule in the right middle lobe measures 7 mm and is new from previous exam. If the patient is at high risk for bronchogenic carcinoma, follow-up chest CT at 3-26months is recommended. If the patient is at low risk for bronchogenic carcinoma, follow-up chest  CT at 6-12 months is recommended. This recommendation follows the consensus statement: Guidelines for Management of Small Pulmonary Nodules Detected on CT Scans: A Statement from the Fleischner Society as published in Radiology 2005; 237:395-400. 4. Diffuse bronchial wall thickening with emphysema, as above; imaging findings suggestive of underlying COPD. 5. Atherosclerotic disease. 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 Recommend semi-annual imaging followup by CTA or MRA and referral to cardiothoracic surgery if not already obtained. This recommendation follows 2010 ACCF/AHA/AATS/ACR/ASA/SCA/SCAI/SIR/STS/SVM Guidelines for the Diagnosis and Management of Patients With Thoracic Aortic Disease. Circulation. 2010; 121: [phone]   Electronically Signed   By: Signa Kell M.D.   On: 11/18/2014 17:56     I have independently reviewed the above radiologic studies.  Recent Lab Findings: Lab Results  Component Value Date   WBC 15.3* 11/22/2014   HGB 11.9* 11/22/2014   HCT 35.8* 11/22/2014   PLT 367 11/22/2014   GLUCOSE 118* 11/22/2014   CHOL 153 09/17/2013   TRIG 298* 09/17/2013   HDL 18* 09/17/2013   LDLCALC 75 09/17/2013    ALT 11 11/22/2014   AST 19 11/22/2014   NA 135 11/22/2014   K 3.4* 11/22/2014   CL 105 11/22/2014   CREATININE 1.08 11/22/2014   BUN 14 11/22/2014   CO2 23 11/22/2014   TSH 2.226 06/25/2009   INR 2.09* 11/22/2014      Assessment / Plan:   I have recommended to patient proceeding with right VATS for drainage of complex loculated effusion following pneumonia Risks and options discussed Will coordinate with cardiology holding coumadin    I  spent 40 minutes counseling the patient face to face and 50% or more the  time was spent in counseling and coordination of care. The total time spent in the appointment was 60 minutes.  Delight Ovens MD      [address] [place] [place] [address] Office [phone]   Beeper [phone]  11/25/2014 7:48 AM

## 2014-11-25 NOTE — Brief Op Note (Addendum)
      [address]       [place] [id number]             [phone]      11/25/2014  2:51 PM  PATIENT:  Tony Zhang  69 y.o. male  PRE-OPERATIVE DIAGNOSIS:   1. History of CAP 2. Loculated right pleural effusion 3. Right Empyema  POST-OPERATIVE DIAGNOSIS:  1. History of CAP 2. Loculated right pleural effusion 3. Right Empyema  PROCEDURE: VIDEO BRONCHOSCOPY, Right VIDEO ASSISTED THORACOSCOPY, Right mini thoracotomy for drainage of right empyema and loculated effusion, and partial decortication   SURGEON:  Surgeon(s) and Role:    * Delight OvensEdward B Careem Yasui, MD - Primary  PHYSICIAN ASSISTANT: Doree Fudgeonielle Zimmerman PA-C  ANESTHESIA:   general  EBL:  Total I/O In: -  Out: 400 [Urine:320; Blood:80]  BLOOD ADMINISTERED:none  DRAINS: Two 28 Blake drains  SPECIMEN:  Source of Specimen:  Right pleural fluid and empyema  DISPOSITION OF SPECIMEN:  Pathology and culture  COUNTS CORRECT:  YES  DICTATION: .Dragon Dictation  PLAN OF CARE: Admit to inpatient   PATIENT DISPOSITION:  PACU - hemodynamically stable.   Delay start of Pharmacological VTE agent (>24hrs) due to surgical blood loss or risk of bleeding: yes      Rt VATS

## 2014-11-26 ENCOUNTER — Encounter (HOSPITAL_COMMUNITY): Payer: Self-pay | Admitting: Cardiothoracic Surgery

## 2014-11-26 ENCOUNTER — Inpatient Hospital Stay (HOSPITAL_COMMUNITY): Payer: Medicare Other

## 2014-11-26 DIAGNOSIS — T8182XD Emphysema (subcutaneous) resulting from a procedure, subsequent encounter: Secondary | ICD-10-CM

## 2014-11-26 LAB — PROTIME-INR
INR: 1.37 (ref 0.00–1.49)
PROTHROMBIN TIME: 17 s — AB (ref 11.6–15.2)

## 2014-11-26 LAB — GLUCOSE, CAPILLARY
Glucose-Capillary: 121 mg/dL — ABNORMAL HIGH (ref 70–99)
Glucose-Capillary: 107 mg/dL — ABNORMAL HIGH (ref 70–99)
Glucose-Capillary: 109 mg/dL — ABNORMAL HIGH (ref 70–99)
Glucose-Capillary: 117 mg/dL — ABNORMAL HIGH (ref 70–99)
Glucose-Capillary: 83 mg/dL (ref 70–99)
Glucose-Capillary: 92 mg/dL (ref 70–99)

## 2014-11-26 LAB — BASIC METABOLIC PANEL
Anion gap: 6 (ref 5–15)
BUN: 14 mg/dL (ref 6–23)
CO2: 23 mmol/L (ref 19–32)
Calcium: 7.8 mg/dL — ABNORMAL LOW (ref 8.4–10.5)
Chloride: 104 mmol/L (ref 96–112)
Creatinine, Ser: 0.95 mg/dL (ref 0.50–1.35)
GFR calc Af Amer: >90 mL/min (ref >90)
GFR calc non Af Amer: 83 mL/min — ABNORMAL LOW (ref >90)
Glucose, Bld: 127 mg/dL — ABNORMAL HIGH (ref 70–99)
Potassium: 3.8 mmol/L (ref 3.5–5.1)
Sodium: 133 mmol/L — ABNORMAL LOW (ref 135–145)

## 2014-11-26 LAB — CBC
HEMATOCRIT: 30.9 % — AB (ref 39.0–52.0)
Hemoglobin: 10.2 g/dL — ABNORMAL LOW (ref 13.0–17.0)
MCH: 27.3 pg (ref 26.0–34.0)
MCHC: 33 g/dL (ref 30.0–36.0)
MCV: 82.6 fL (ref 78.0–100.0)
PLATELETS: 356 10*3/uL (ref 150–400)
RBC: 3.74 MIL/uL — ABNORMAL LOW (ref 4.22–5.81)
RDW: 15.7 % — ABNORMAL HIGH (ref 11.5–15.5)
WBC: 13 10*3/uL — ABNORMAL HIGH (ref 4.0–10.5)

## 2014-11-26 MED ORDER — IPRATROPIUM-ALBUTEROL 0.5-2.5 (3) MG/3ML IN SOLN
3.0000 mL | Freq: Four times a day (QID) | RESPIRATORY_TRACT | Status: DC
  Administered 2014-11-26 – 2014-11-29 (×12): 3 mL via RESPIRATORY_TRACT
  Filled 2014-11-26 (×12): qty 3

## 2014-11-26 MED ORDER — ENOXAPARIN SODIUM 30 MG/0.3ML ~~LOC~~ SOLN
30.0000 mg | SUBCUTANEOUS | Status: DC
  Administered 2014-11-26 – 2014-11-29 (×4): 30 mg via SUBCUTANEOUS
  Filled 2014-11-26 (×5): qty 0.3

## 2014-11-26 MED ORDER — POTASSIUM CHLORIDE ER 10 MEQ PO TBCR
20.0000 meq | EXTENDED_RELEASE_TABLET | Freq: Once | ORAL | Status: AC
  Administered 2014-11-26: 20 meq via ORAL
  Filled 2014-11-26: qty 2

## 2014-11-26 MED ORDER — POTASSIUM CHLORIDE 10 MEQ/50ML IV SOLN
10.0000 meq | INTRAVENOUS | Status: DC
  Filled 2014-11-26: qty 50

## 2014-11-26 MED ORDER — INSULIN ASPART 100 UNIT/ML ~~LOC~~ SOLN
0.0000 [IU] | Freq: Every day | SUBCUTANEOUS | Status: DC
Start: 2014-11-26 — End: 2014-11-27

## 2014-11-26 MED ORDER — INSULIN ASPART 100 UNIT/ML ~~LOC~~ SOLN
0.0000 [IU] | Freq: Three times a day (TID) | SUBCUTANEOUS | Status: DC
Start: 2014-11-26 — End: 2014-11-27

## 2014-11-26 MED ORDER — ALBUTEROL SULFATE (2.5 MG/3ML) 0.083% IN NEBU: 2.5000 mg | INHALATION_SOLUTION | RESPIRATORY_TRACT | Status: DC | PRN

## 2014-11-26 NOTE — Progress Notes (Signed)
PULMONARY / CRITICAL CARE MEDICINE   Name: Tony Zhang MRN: [id number] DOB: Oct 15, 1946    ADMISSION DATE:  11/25/2014 CONSULTATION DATE:  11/25/2014  REFERRING MD :  Ofilia NeasEd Gerhardt  CHIEF COMPLAINT:  Empyema  INITIAL PRESENTATION:  69 yo male former smoker tx for PNA in December 2015 developed loculated Rt pleural effusion with pleurisy >> admitted for Rt VATS.  STUDIES:  2/01 CT chest >> moderate centrilobular/paraseptal emphysema, loculated Rt pleural effusion, 7 mm nodule RML  SIGNIFICANT EVENTS: 2/08 Rt VATS   SUBJECTIVE:  No new complaints  VITAL SIGNS: Temp:  [97.7 F (36.5 C)-98.3 F (36.8 C)] 97.8 F (36.6 C) (02/09 1525) Pulse Rate:  [62-89] 76 (02/09 1500) Resp:  [12-22] 21 (02/09 1500) BP: (101-154)/(57-83) 126/72 mmHg (02/09 1400) SpO2:  [92 %-100 %] 93 % (02/09 1500) Arterial Line BP: (44-183)/(24-75) 130/52 mmHg (02/09 0800) FiO2 (%):  [98 %] 98 % (02/09 0800) INTAKE / OUTPUT:  Intake/Output Summary (Last 24 hours) at 11/26/14 1552 Last data filed at 11/26/14 1500  Gross per 24 hour  Intake 2954.66 ml  Output   2099 ml  Net 855.66 ml    PHYSICAL EXAMINATION: General: no distress Neuro: no focal deficits HEENT: WNL Cardiovascular:  Regular, no murmur Lungs: Clear anteriorly, no wheezes Abdomen:  Soft, non tender Musculoskeletal:  No edema  LABS: I have reviewed all of today's lab results. Relevant abnormalities are discussed in the A/P section  CXR: Post op changes  ASSESSMENT / PLAN:  PULMONARY Rt chest tube 2/08 >> A: COPD/Emphysema  S/p VATS for empyema 2/08. 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 Hx of refractory HTN, PAD, HLD, AAA. P:  Monitor hemodynamics Cont current Rx Resume full anticoagulation when OK with TCTS  RENAL A:   Stage 2 CKD. P:   Monitor BMET  intermittently Monitor I/Os Correct electrolytes as indicated  GASTROINTESTINAL A:   Nutrition. P:   Advance diet as tolerated  HEMATOLOGIC A:   No acute issues. P:  F/u CBC SCD's  INFECTIOUS A:   Rt empyema after PNA in December 2015. P:  Cont Vanc/Zosyn F/U micro  ENDOCRINE A:   Hx of gout. P:   Monitor clinically  NEUROLOGIC A:  Post-op pain control. P:   Dilaudid PCA  Billy Fischeravid Linnet Bottari, MD ; [facility] service Mobile [phone].  After 5:30 PM or weekends, call [phone]

## 2014-11-26 NOTE — Progress Notes (Addendum)
TCTS DAILY ICU PROGRESS NOTE                   [address]            [place] [id number]          [phone]   1 Day Post-Op Procedure(s) (LRB): VIDEO BRONCHOSCOPY (N/A) VIDEO ASSISTED THORACOSCOPY (Right) EMPYEMA DRAINAGE (N/A)  Total Length of Stay:  LOS: 1 day   Subjective: Patient with a "little nausea" this am.  Objective: Vital signs in last 24 hours: Temp:  [97.7 F (36.5 C)-98.5 F (36.9 C)] 97.7 F (36.5 C) (02/09 0730) Pulse Rate:  [62-90] 64 (02/09 0700) Cardiac Rhythm:  [-] Normal sinus rhythm (02/09 0600) Resp:  [12-22] 17 (02/09 0700) BP: (101-193)/(57-91) 110/68 mmHg (02/09 0700) SpO2:  [91 %-99 %] 97 % (02/09 0700) Arterial Line BP: (44-183)/(24-75) 118/52 mmHg (02/09 0700) Weight:  [216 lb 6.4 oz (98.158 kg)] 216 lb 6.4 oz (98.158 kg) (02/08 1201)  Filed Weights   11/25/14 1201  Weight: 216 lb 6.4 oz (98.158 kg)    Intake/Output from previous day: 02/08 0701 - 02/09 0700 In: 4000.5 [P.O.:60; I.V.:3178; IV Piggyback:762.5] Out: 1859 [Urine:1560; Blood:80; Chest Tube:219]  Intake/Output this shift:    Current Meds: Scheduled Meds: . acetaminophen  1,000 mg Oral 4 times per day   Or  . acetaminophen (TYLENOL) oral liquid 160 mg/5 mL  1,000 mg Oral 4 times per day  . antiseptic oral rinse  7 mL Mouth Rinse BID  . [START ON 11/27/2014] benazepril  20 mg Oral Daily  . bisacodyl  10 mg Oral Daily  . cefUROXime (ZINACEF)  IV  1.5 g Intravenous Q12H  . fenofibrate  160 mg Oral Daily  . fentaNYL   Intravenous 6 times per day  . insulin aspart  0-24 Units Subcutaneous 4 times per day  . nebivolol  5 mg Oral BID  . pantoprazole  40 mg Oral Daily  . piperacillin-tazobactam (ZOSYN)  IV  3.375 g Intravenous Q8H  . senna-docusate  1 tablet Oral QHS  . simvastatin  40 mg Oral q1800  . sodium chloride  10-40 mL Intracatheter Q12H  . vancomycin  1,250 mg Intravenous Q12H   Continuous Infusions: . dextrose 5 % and 0.9 % NaCl with KCl 20  mEq/L 100 mL/hr at 11/25/14 2000   PRN Meds:.diphenhydrAMINE **OR** diphenhydrAMINE, hydrALAZINE, ipratropium-albuterol, naloxone **AND** sodium chloride, ondansetron (ZOFRAN) IV, potassium chloride, sodium chloride, traMADol  General appearance: alert, cooperative and no distress Neurologic: intact Heart: RRR Lungs: Diminished at bases R>L Abdomen: soft, non-tender; bowel sounds normal; no masses,  no organomegaly Extremities: Trace LE edema Wound: Dressing is clean and dry  Lab Results: CBC: Recent Labs  11/25/14 1750 11/26/14 0400  WBC 11.4* 13.0*  HGB 10.6* 10.2*  HCT 32.3* 30.9*  PLT 317 356   BMET:  Recent Labs  11/25/14 1750 11/26/14 0400  NA 134* 133*  K 3.2* 3.8  CL 102 104  CO2 25 23  GLUCOSE 122* 127*  BUN 12 14  CREATININE 0.96 0.95  CALCIUM 8.0* 7.8*    PT/INR:  Recent Labs  11/26/14 0400  LABPROT 17.0*  INR 1.37   Radiology: Dg Chest Port 1 View  11/25/2014   CLINICAL DATA:  Followup pneumothorax.  EXAM: PORTABLE CHEST - 1 VIEW  COMPARISON:  11/22/2014 Chest x-ray and 11/18/2014 chest CT  FINDINGS: The right IJ catheter is in good position in the mid SVC. The heart is mildly  enlarged. There is tortuosity and calcification of the thoracic aorta. Stable underlying emphysematous changes and pulmonary scarring. Persistent right-sided pleural effusion with PleurX drainage catheter in place.  IMPRESSION: Persistent small right pleural effusion.  Chronic emphysematous changes and pulmonary scarring.   Electronically Signed   By: Loralie ChampagneMark  Gallerani M.D.   On: 11/25/2014 16:11     Assessment/Plan: S/P Procedure(s) (LRB): VIDEO BRONCHOSCOPY (N/A) VIDEO ASSISTED THORACOSCOPY (Right) EMPYEMA DRAINAGE (N/A)  1.CV-SR in the 60's. Was on Bystolic and Benazepril prior to surgery. Will not restart yet. On Coumadin prir to surgery for DVT/blood dyscrasia. Will discuss if start today or in am. INR today 1.37. 2. Pulmonary-On 3 liters of oxygen via [place]. History of COPD  but not oxygen dependent prior to surgery. Will wean as tolerates. Chest tubes with 219 cc since surgery. There is tidling with cough. Possibly place to water seal. CXR appears to show no pneumothorax, emphysematous changes, right pleural effusion. Encourage incentive spirometer and flutter valve. 3. Remove a line 4. Anemia-H and H stable at 10.2 and 30.9 5. Supplement potassium 6.ID- On Vanco and Zosyn for PNA. Right pleural fluid cultures show moderate WBC,no organisms seen. 7. Will stop accu checks (not diabetic) once tolerating po better. 8. Appreciate pulmonary assistance  ZIMMERMAN,DONIELLE M PA-C 11/26/2014 7:38 AM  Expected Acute  Blood - loss Anemia Start Lovenox, transition back to coumadin over next several days Cultures pending I have seen and examined Guerry MinorsJerry F Poster and agree with the above assessment  and plan.  Delight OvensEdward B Aranza Geddes MD Beeper [phone] Office [phone] 11/26/2014 9:34 AM

## 2014-11-26 NOTE — Progress Notes (Signed)
Patient ID: Tony Zhang, male   DOB: 06/15/1946, 69 y.o.   MRN: [id number]  SICU Evening Rounds:  Hemodynamically stable  Urine output good  CT ouput low.  sats 100%

## 2014-11-26 NOTE — Progress Notes (Signed)
Utilization Review Completed.Tony Zhang T2/06/2015  

## 2014-11-26 NOTE — Op Note (Signed)
NAMLaurie Zhang:  Cerrato, Leroy                 ACCOUNT NO.:  [id number]  MEDICAL RECORD NO.:  [id number]  LOCATION:  2S01C                        FACILITY:  [facility]  PHYSICIAN:  Sheliah PlaneEdward Kahliya Fraleigh, MD    DATE OF BIRTH:  [birth year]  DATE OF PROCEDURE:  11/25/2014 DATE OF DISCHARGE:                              OPERATIVE REPORT   PREOPERATIVE DIAGNOSIS:  Recent hospitalization for pneumonia now with complex right chest effusion.  POSTOPERATIVE DIAGNOSIS:  Recent hospitalization for pneumonia now with complex right chest effusion, empyema.  PROCEDURE PERFORMED:  Bronchoscopy, right video-assisted thoracoscopy, mini thoracotomy, drainage of empyema, and decortication of right lower chest.  SURGEON:  Sheliah PlaneEdward Bobak Oguinn, MD  FIRST ASSISTANT:  Doree Fudgeonielle Zimmerman, PA  BRIEF HISTORY:  The patient is a 69 year old male who a month prior had been admitted [facility] for pneumonia and was treated on followup evaluation on routine visit with his cardiologist.  He was still having right pleuritic pain.  Chest x-ray suggested effusion.  CT scan was performed which demonstrated a complex right posterolateral chest fluid collection suggestive of empyema.  The patient was referred to Thoracic Surgery.  He had previously been on Coumadin for DVT and pulmonary embolus many years ago.  This was held and we recommended proceeding with drainage of his effusion and decortication.  The patient agreed and signed informed consent.  DESCRIPTION OF PROCEDURE:  The patient was brought to the operating room and underwent general endotracheal anesthesia without incident. Appropriate time-out was performed.  A bronchoscopy was performed through the single-lumen endotracheal tube to the subsegmental level. There were no endobronchial lesions appreciated and very little in the way of secretions. Bronchoalveolar lavage was performed on the right upper lobe which on his preop chest x-ray suggested a persistent effusion.   Appropriate cultures were sent.  The patient was then turned in lateral decubitus position with the right side up.  Again, a second time-out was performed.  Chest was prepped with Betadine and draped in sterile manner.  A small port incision was made over the seventh intercostal space along the posterior axillary line which approximated the area of the loculation,  on entering the space was significant amount of creamy purulent material.  This was drained and cultured.  Anaerobic cultures were also obtained.  We then extended the incision slightly and proceeded with complete drainage and decortication of the early stages of fibrotic peel.  The entire cavity was copiously irrigated. Two Blake drains were left in place through separate sites.  The incision was closed with interrupted 0 Vicryl, running 2-0 Vicryl, and 3-0 subcuticular stitch.  Dermabond was applied.  The patient was awakened in the operating room prior to extubation, and as we were closing the lung, it did appear to inflate nicely and fill the space.  Estimated blood loss approximately 200 mL.  No blood products were required to be administered.  Sponge and needle count was reported as correct.  The patient tolerated the procedure without obvious complication and was extubated in the operating room and transferred to the recovery room for further postoperative care.     Sheliah PlaneEdward Faithlyn Recktenwald, MD     EG/MEDQ  D:  11/26/2014  T:  11/26/2014  Job:  [id number]

## 2014-11-26 NOTE — Progress Notes (Signed)
Nurses noted difficulty pulling out foley, balloon full deflated and foley was removed. Some blood from meatus. Will monitor for uop and check bladder scan, if unable to void or increased blood per meatus will  Contact Urology  Delight OvensEdward B Reece Fehnel MD      [address] [place] [id number] Office [phone]   Beeper [phone]  11/26/2014 10:38 AM

## 2014-11-27 ENCOUNTER — Inpatient Hospital Stay (HOSPITAL_COMMUNITY): Payer: Medicare Other

## 2014-11-27 DIAGNOSIS — J90 Pleural effusion, not elsewhere classified: Secondary | ICD-10-CM | POA: Insufficient documentation

## 2014-11-27 DIAGNOSIS — J869 Pyothorax without fistula: Secondary | ICD-10-CM | POA: Insufficient documentation

## 2014-11-27 DIAGNOSIS — J939 Pneumothorax, unspecified: Secondary | ICD-10-CM | POA: Insufficient documentation

## 2014-11-27 LAB — CBC
HCT: 31.2 % — ABNORMAL LOW (ref 39.0–52.0)
Hemoglobin: 10 g/dL — ABNORMAL LOW (ref 13.0–17.0)
MCH: 27.4 pg (ref 26.0–34.0)
MCHC: 32.1 g/dL (ref 30.0–36.0)
MCV: 85.5 fL (ref 78.0–100.0)
PLATELETS: 278 10*3/uL (ref 150–400)
RBC: 3.65 MIL/uL — AB (ref 4.22–5.81)
RDW: 16.2 % — ABNORMAL HIGH (ref 11.5–15.5)
WBC: 9.4 10*3/uL (ref 4.0–10.5)

## 2014-11-27 LAB — COMPREHENSIVE METABOLIC PANEL
ALT: 9 U/L (ref 0–53)
ANION GAP: 6 (ref 5–15)
AST: 17 U/L (ref 0–37)
Albumin: 2.5 g/dL — ABNORMAL LOW (ref 3.5–5.2)
Alkaline Phosphatase: 51 U/L (ref 39–117)
BUN: 12 mg/dL (ref 6–23)
CALCIUM: 8.1 mg/dL — AB (ref 8.4–10.5)
CO2: 24 mmol/L (ref 19–32)
Chloride: 104 mmol/L (ref 96–112)
Creatinine, Ser: 1.01 mg/dL (ref 0.50–1.35)
GFR calc non Af Amer: 74 mL/min — ABNORMAL LOW (ref >90)
GFR, EST AFRICAN AMERICAN: 86 mL/min — AB (ref >90)
Glucose, Bld: 80 mg/dL (ref 70–99)
Potassium: 3.5 mmol/L (ref 3.5–5.1)
Sodium: 134 mmol/L — ABNORMAL LOW (ref 135–145)
Total Bilirubin: 0.9 mg/dL (ref 0.3–1.2)
Total Protein: 5.9 g/dL — ABNORMAL LOW (ref 6.0–8.3)

## 2014-11-27 LAB — PROTIME-INR
INR: 1.44 (ref 0.00–1.49)
PROTHROMBIN TIME: 17.7 s — AB (ref 11.6–15.2)

## 2014-11-27 LAB — GLUCOSE, CAPILLARY
Glucose-Capillary: 137 mg/dL — ABNORMAL HIGH (ref 70–99)
Glucose-Capillary: 78 mg/dL (ref 70–99)

## 2014-11-27 MED ORDER — HYDROCODONE-ACETAMINOPHEN 5-325 MG PO TABS
1.0000 | ORAL_TABLET | ORAL | Status: DC | PRN
  Administered 2014-11-27 – 2014-11-30 (×12): 1 via ORAL
  Filled 2014-11-27 (×12): qty 1

## 2014-11-27 MED ORDER — POTASSIUM CHLORIDE CRYS ER 10 MEQ PO TBCR
30.0000 meq | EXTENDED_RELEASE_TABLET | Freq: Once | ORAL | Status: AC
  Administered 2014-11-27: 30 meq via ORAL
  Filled 2014-11-27 (×2): qty 1

## 2014-11-27 NOTE — Progress Notes (Addendum)
TCTS DAILY ICU PROGRESS NOTE                   [address]            [place] [id number]          [phone]   2 Days Post-Op Procedure(s) (LRB): VIDEO BRONCHOSCOPY (N/A) VIDEO ASSISTED THORACOSCOPY (Right) EMPYEMA DRAINAGE (N/A)  Total Length of Stay:  LOS: 2 days   Subjective: Patient had bowel movement last night. Has already walked this am.  Objective: Vital signs in last 24 hours: Temp:  [97.7 F (36.5 C)-98.7 F (37.1 C)] 98.1 F (36.7 C) (02/10 0716) Pulse Rate:  [64-87] 87 (02/10 0700) Cardiac Rhythm:  [-] Normal sinus rhythm (02/10 0600) Resp:  [13-26] 23 (02/10 0700) BP: (98-154)/(59-83) 98/61 mmHg (02/10 0700) SpO2:  [93 %-100 %] 98 % (02/10 0802) Weight:  [216 lb 14.9 oz (98.4 kg)] 216 lb 14.9 oz (98.4 kg) (02/10 0600)  Filed Weights   11/25/14 1201 11/27/14 0600  Weight: 216 lb 6.4 oz (98.158 kg) 216 lb 14.9 oz (98.4 kg)    Intake/Output from previous day: 02/09 0701 - 02/10 0700 In: 2254.2 [P.O.:360; I.V.:1244.2; IV Piggyback:650] Out: 3060 [Urine:2870; Chest Tube:190]  Intake/Output this shift:    Current Meds: Scheduled Meds: . acetaminophen  1,000 mg Oral 4 times per day   Or  . acetaminophen (TYLENOL) oral liquid 160 mg/5 mL  1,000 mg Oral 4 times per day  . antiseptic oral rinse  7 mL Mouth Rinse BID  . bisacodyl  10 mg Oral Daily  . enoxaparin (LOVENOX) injection  30 mg Subcutaneous Q24H  . fenofibrate  160 mg Oral Daily  . fentaNYL   Intravenous 6 times per day  . insulin aspart  0-5 Units Subcutaneous QHS  . insulin aspart  0-9 Units Subcutaneous TID WC  . ipratropium-albuterol  3 mL Nebulization QID  . pantoprazole  40 mg Oral Daily  . piperacillin-tazobactam (ZOSYN)  IV  3.375 g Intravenous Q8H  . senna-docusate  1 tablet Oral QHS  . simvastatin  40 mg Oral q1800  . sodium chloride  10-40 mL Intracatheter Q12H  . vancomycin  1,250 mg Intravenous Q12H   Continuous Infusions: . dextrose 5 % and 0.9 % NaCl with  KCl 20 mEq/L 50 mL/hr at 11/27/14 0400   PRN Meds:.albuterol, diphenhydrAMINE **OR** diphenhydrAMINE, hydrALAZINE, naloxone **AND** sodium chloride, ondansetron (ZOFRAN) IV, potassium chloride, sodium chloride, traMADol  General appearance: alert, cooperative and no distress Heart: RRR Lungs: Diminished at bases R>L, coarse on right Abdomen: soft, non-tender; bowel sounds normal; no masses,  no organomegaly Extremities: Trace LE edema Wound: Dressing is clean and dry  Lab Results: CBC:  Recent Labs  11/26/14 0400 11/27/14 0445  WBC 13.0* 9.4  HGB 10.2* 10.0*  HCT 30.9* 31.2*  PLT 356 278   BMET:   Recent Labs  11/26/14 0400 11/27/14 0445  NA 133* 134*  K 3.8 3.5  CL 104 104  CO2 23 24  GLUCOSE 127* 80  BUN 14 12  CREATININE 0.95 1.01  CALCIUM 7.8* 8.1*    PT/INR:   Recent Labs  11/27/14 0445  LABPROT 17.7*  INR 1.44   Radiology: Dg Chest Port 1 View  11/26/2014   CLINICAL DATA:  S/p thoracotomy for right empyema drainage.  EXAM: PORTABLE CHEST - 1 VIEW  COMPARISON:  Portable chest x-ray of November 25, 2014  FINDINGS: The lungs are adequately inflated. There remains pleural fluid on  the right. Two chest tubes are visible at right lung base and unchanged in position. There is no pneumothorax. The interstitial markings of the right mid and lower lung and at the left lung base are mildly increased but not greatly changed from the earlier study.  The cardiac silhouette is top-normal in size. The pulmonary vascularity is not engorged. The right internal jugular venous catheter tip projects over the junction of the middle and distal thirds of the SVC.  IMPRESSION: Slight interval improvement in aeration of both lungs. There is subsegmental atelectasis at both lung bases, and a sign rib small right-sided pleural effusion persists. The right-sided chest tubes are unchanged in position.   Electronically Signed   By: David  [place]   On: 11/26/2014 07:42      Assessment/Plan: S/P Procedure(s) (LRB): VIDEO BRONCHOSCOPY (N/A) VIDEO ASSISTED THORACOSCOPY (Right) EMPYEMA DRAINAGE (N/A)  1.CV-SR in the 60's. Was on Bystolic and Benazepril prior to surgery. Will not restart yet as BP labile. On Coumadin prir to surgery for DVT/blood dyscrasia. Will likely start in a few days. INR today 1.44. On Lovenox.  2. Pulmonary-On 2 liters of oxygen via [place]. History of COPD but not oxygen dependent prior to surgery. Will wean as tolerates. Chest tubes with 190 cc last 24 hours  There is no air leak. Remove one chest tube (anterior) and then place remaining to water seal. CXR shows no pneumothorax, cardiomegaly, bibasilar atelectasis, right pleural effusion. Encourage incentive spirometer and flutter valve. Pathology: Pleura, peel - BENIGN INFLAMMATORY EXUDATE WITH ABUNDANT FIBRIN AND HISTIOCYTES. - NEGATIVE FOR ATYPIA OR MALIGNANCY. 4. Anemia-H and H stable at 10.0 and 31.2 6.ID- On Vanco and Zosyn for PNA. Right pleural fluid cultures show moderate WBC,no growth to date.Negative for yeast or fungus. 7. Will stop accu checks (not diabetic)  8. Supplement potassium 9. May be able to transfer to 3300 if bed available  ZIMMERMAN,DONIELLE M PA-C 11/27/2014 8:16 AM    I have seen and examined the patient and agree with the assessment and plan as outlined.  Waiting for bed on step down unit.  Purcell Nails 11/27/2014 6:46 PM

## 2014-11-27 NOTE — Progress Notes (Signed)
PULMONARY / CRITICAL CARE MEDICINE   Name: Tony Zhang MRN: [id number] DOB: 1946-01-13    ADMISSION DATE:  11/25/2014 CONSULTATION DATE:  11/25/2014  REFERRING MD :  Tony Zhang  CHIEF COMPLAINT:  Empyema  INITIAL PRESENTATION:  69 yo male former smoker tx for PNA in December 2015 developed loculated Rt pleural effusion with pleurisy >> admitted for Rt VATS.  STUDIES:  2/01 CT chest >> moderate centrilobular/paraseptal emphysema, loculated Rt pleural effusion, 7 mm nodule RML  SIGNIFICANT EVENTS: 2/08 Rt VATS 2/9 extubated  SUBJECTIVE:  No distress  VITAL SIGNS: Temp:  [97.7 F (36.5 C)-98.7 F (37.1 C)] 98.1 F (36.7 C) (02/10 0716) Pulse Rate:  [65-87] 73 (02/10 0800) Resp:  [16-26] 16 (02/10 0826) BP: (98-154)/(59-79) 101/59 mmHg (02/10 0800) SpO2:  [93 %-100 %] 96 % (02/10 0826) Weight:  [98.4 kg (216 lb 14.9 oz)] 98.4 kg (216 lb 14.9 oz) (02/10 0600) INTAKE / OUTPUT:  Intake/Output Summary (Last 24 hours) at 11/27/14 1149 Last data filed at 11/27/14 0700  Gross per 24 hour  Intake   1600 ml  Output   2990 ml  Net  -1390 ml    PHYSICAL EXAMINATION: General: no distress, flat in bed Neuro: no focal deficits, awakens HEENT: WNL Cardiovascular:  Regular, no murmur s1 s2 RR Lungs: Clear anteriorly, no wheezes, reduced bases Abdomen:  Soft, non tender Musculoskeletal:  No edema  LABS: I have reviewed all of today's lab results. Relevant abnormalities are discussed in the A/P section  CXR: improved rt base infiltrate  ASSESSMENT / PLAN:  PULMONARY Rt chest tube 2/08 >> A: COPD/Emphysema  S/p VATS for empyema 2/08. 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 Hx of refractory HTN, PAD, HLD, AAA. P:  Monitor hemodynamics- tele Cont current Rx Resume full  anticoagulation when OK with TCTS  RENAL A:   Stage 2 CKD. P:   Monitor BMET intermittently Monitor I/Os Correct electrolytes as indicated Would not diurese at this stage  GASTROINTESTINAL A:   Nutrition. P:   Advance diet as tolerated  HEMATOLOGIC A:   No acute issues. P:  F/u CBC SCD's  INFECTIOUS A:   Rt empyema after PNA in December 2015. P:  Cont Vanc/Zosyn, remains culture neg thus far, dc vanc Continue zosyn for now, will narrow further if finals neg F/U micro to final  ENDOCRINE A:   Hx of gout. P:   Monitor clinically  NEUROLOGIC A:  Post-op pain control. P:   Dilaudid PCA Mobilize   Consider transfer to floor  Tony Rossettianiel J. Tyson AliasFeinstein, MD, FACP Pgr: [phone] [facility]

## 2014-11-28 ENCOUNTER — Inpatient Hospital Stay (HOSPITAL_COMMUNITY): Payer: Medicare Other

## 2014-11-28 LAB — PROTIME-INR
INR: 1.45 (ref 0.00–1.49)
Prothrombin Time: 17.8 seconds — ABNORMAL HIGH (ref 11.6–15.2)

## 2014-11-28 LAB — CULTURE, RESPIRATORY W GRAM STAIN: Culture: NO GROWTH

## 2014-11-28 MED ORDER — WARFARIN - PHYSICIAN DOSING INPATIENT: Freq: Every day | Status: DC

## 2014-11-28 MED ORDER — WARFARIN SODIUM 2.5 MG PO TABS
2.5000 mg | ORAL_TABLET | Freq: Once | ORAL | Status: AC
  Administered 2014-11-28: 2.5 mg via ORAL
  Filled 2014-11-28: qty 1

## 2014-11-28 MED ORDER — AMOXICILLIN-POT CLAVULANATE 875-125 MG PO TABS
1.0000 | ORAL_TABLET | Freq: Two times a day (BID) | ORAL | Status: DC
  Administered 2014-11-28 – 2014-11-30 (×5): 1 via ORAL
  Filled 2014-11-28 (×6): qty 1

## 2014-11-28 MED ORDER — GUAIFENESIN ER 600 MG PO TB12
600.0000 mg | ORAL_TABLET | Freq: Two times a day (BID) | ORAL | Status: DC
  Administered 2014-11-28 – 2014-11-30 (×5): 600 mg via ORAL
  Filled 2014-11-28 (×6): qty 1

## 2014-11-28 NOTE — Progress Notes (Signed)
D/c'd fentannyl pca, wasted 28cc in sink, witnessed by Cottrell Gentles rn and tracie davis rn.

## 2014-11-28 NOTE — Progress Notes (Signed)
Utilization review completed.  

## 2014-11-28 NOTE — Care Management Note (Unsigned)
    Page 1 of 1   11/28/2014     11:08:37 AM CARE MANAGEMENT NOTE 11/28/2014  Patient:  Guerry MinorsLEWIS,Kasper F   Account Number:  [id number]  Date Initiated:  11/28/2014  Documentation initiated by:  Gae GallopOLE,ANGELA  Subjective/Objective Assessment:   Pt admitted s/p VATS     Action/Plan:   Prior to admission from home alone.   Anticipated DC Date:  11/29/2014   Anticipated DC Plan:  HOME/SELF CARE         Choice offered to / List presented to:             Status of service:  In process, will continue to follow Medicare Important Message given?  YES (If response is "NO", the following Medicare IM given date fields will be blank) Date Medicare IM given:  11/28/2014 Medicare IM given by:  Gae GallopOLE,ANGELA Date Additional Medicare IM given:   Additional Medicare IM given by:    Discharge Disposition:    Per UR Regulation:  Reviewed for med. necessity/level of care/duration of stay  If discussed at Long Length of Stay Meetings, dates discussed:    Comments:

## 2014-11-28 NOTE — Progress Notes (Addendum)
[address]       [place] [id number]             [phone]          3 Days Post-Op Procedure(s) (LRB): VIDEO BRONCHOSCOPY (N/A) VIDEO ASSISTED THORACOSCOPY (Right) EMPYEMA DRAINAGE (N/A)  Subjective: Getting a breathing treatment.  Still having some trouble coughing up any phlegm. Pain controlled.  Ambulating in halls, appetite ok, bowels working.   Objective: Vital signs in last 24 hours: Patient Vitals for the past 24 hrs:  BP Temp Temp src Pulse Resp SpO2 Height Weight  11/28/14 0811 - - - - - 100 % - -  11/28/14 0758 106/64 mmHg 97.5 F (36.4 C) Oral 88 (!) 24 94 % 6\' 1"  (1.854 m) 219 lb 5.7 oz (99.5 kg)  11/28/14 0306 116/71 mmHg 98.7 F (37.1 C) Oral 88 19 95 % - -  11/27/14 2311 (!) 154/82 mmHg 98.7 F (37.1 C) Oral 95 18 94 % - -  11/27/14 2016 - - - 83 (!) 26 95 % - -  11/27/14 2000 115/67 mmHg - - 80 17 91 % - -  11/27/14 1931 - 99.2 F (37.3 C) Oral - - - - -  11/27/14 1900 - - - 86 17 94 % - -  11/27/14 1800 - - - 88 20 96 % - -  11/27/14 1700 - - - 76 17 95 % - -  11/27/14 1639 121/67 mmHg 98.2 F (36.8 C) Oral - - 100 % - -  11/27/14 1600 - - - 72 16 97 % - -  11/27/14 1500 - - - - 19 - - -  11/27/14 1400 127/70 mmHg - - 93 17 96 % - -  11/27/14 1300 99/61 mmHg - - 76 17 95 % - -  11/27/14 1208 - 98.1 F (36.7 C) Oral - 18 97 % - -  11/27/14 1200 116/81 mmHg - - 80 20 96 % - -  11/27/14 1100 99/60 mmHg - - 73 16 97 % - -  11/27/14 1000 112/73 mmHg - - 80 19 97 % - -  11/27/14 0900 (!) 121/104 mmHg - - 91 (!) 21 97 % - -  11/27/14 0826 - - - - 16 96 % - -   Current Weight  11/28/14 219 lb 5.7 oz (99.5 kg)     Intake/Output from previous day: 02/10 0701 - 02/11 0700 In: 660 [P.O.:170; I.V.:290; IV Piggyback:200] Out: 2125 [Urine:2075; Chest Tube:50]    PHYSICAL EXAM:  Heart: RRR Lungs: Rhonchi R base Wound: Clean and dry Chest tube: No air leak    Lab Results: CBC: Recent Labs  11/26/14 0400  11/27/14 0445  WBC 13.0* 9.4  HGB 10.2* 10.0*  HCT 30.9* 31.2*  PLT 356 278   BMET:  Recent Labs  11/26/14 0400 11/27/14 0445  NA 133* 134*  K 3.8 3.5  CL 104 104  CO2 23 24  GLUCOSE 127* 80  BUN 14 12  CREATININE 0.95 1.01  CALCIUM 7.8* 8.1*    PT/INR:  Recent Labs  11/28/14 0310  LABPROT 17.8*  INR 1.45   CXR: FINDINGS: Right IJ line and right chest tube in stable position. Mediastinal structures are normal. Bibasilar atelectasis and/or infiltrates remain. No interim change. Stable small right pleural effusion. No pneumothorax.  IMPRESSION: 1. Right IJ line right chest tube in stable position. No pneumothorax. Stable small residual right pleural effusion. 2. Stable bibasilar  atelectasis and/or infiltrates. 3. Stable cardiomegaly.   Assessment/Plan: S/P Procedure(s) (LRB): VIDEO BRONCHOSCOPY (N/A) VIDEO ASSISTED THORACOSCOPY (Right) EMPYEMA DRAINAGE (N/A) CT with no air leak and minimal output.  CXR stable. Discussed with MD, will  d/c remaining CT today. ID- Cx 's negative so far.  Continue Vanc/Zosyn. WBC trending down. Pulm- continue pulm toilet, nebs, will add Mucinex for sputum. H/o recurrent DVT- restart Coumadin, continue Lovenox. CKD- Cr stable. HTN- BPs stable, resume home meds as able. Ambulate in halls.   LOS: 3 days    Oktober Glazer H 11/28/2014

## 2014-11-29 ENCOUNTER — Other Ambulatory Visit: Payer: Self-pay | Admitting: *Deleted

## 2014-11-29 ENCOUNTER — Inpatient Hospital Stay (HOSPITAL_COMMUNITY): Payer: Medicare Other

## 2014-11-29 LAB — PROTIME-INR
INR: 1.24 (ref 0.00–1.49)
Prothrombin Time: 15.8 seconds — ABNORMAL HIGH (ref 11.6–15.2)

## 2014-11-29 LAB — TISSUE CULTURE: Culture: NO GROWTH

## 2014-11-29 LAB — BODY FLUID CULTURE: Culture: NO GROWTH

## 2014-11-29 MED ORDER — NEBIVOLOL HCL 5 MG PO TABS
5.0000 mg | ORAL_TABLET | Freq: Every day | ORAL | Status: DC
  Administered 2014-11-29 – 2014-11-30 (×2): 5 mg via ORAL
  Filled 2014-11-29 (×2): qty 1

## 2014-11-29 MED ORDER — IPRATROPIUM-ALBUTEROL 0.5-2.5 (3) MG/3ML IN SOLN: 3.0000 mL | RESPIRATORY_TRACT | Status: DC | PRN

## 2014-11-29 MED ORDER — LOSARTAN POTASSIUM 25 MG PO TABS: 25.0000 mg | ORAL_TABLET | Freq: Every day | ORAL | Status: DC

## 2014-11-29 NOTE — Progress Notes (Signed)
       [address]       [place] [id number]             [phone]          4 Days Post-Op Procedure(s) (LRB): VIDEO BRONCHOSCOPY (N/A) VIDEO ASSISTED THORACOSCOPY (Right) EMPYEMA DRAINAGE (N/A)  Subjective: Feels well, no complaints.  Less cough today.    Objective: Vital signs in last 24 hours: Patient Vitals for the past 24 hrs:  BP Temp Temp src Pulse Resp SpO2  11/29/14 0330 136/79 mmHg 97.7 F (36.5 C) Oral 82 14 99 %  11/28/14 2316 110/65 mmHg 98.6 F (37 C) Oral 87 17 93 %  11/28/14 1939 125/66 mmHg 98.6 F (37 C) Oral 90 - 93 %  11/28/14 1620 - - - - - 97 %  11/28/14 1550 - 98.5 F (36.9 C) Oral - - -  11/28/14 1531 110/62 mmHg - - 81 18 95 %  11/28/14 1214 102/66 mmHg 98.2 F (36.8 C) Oral 79 (!) 22 96 %  11/28/14 0811 - - - - - 100 %   Current Weight  11/28/14 219 lb 5.7 oz (99.5 kg)     Intake/Output from previous day: 02/11 0701 - 02/12 0700 In: 1380 [P.O.:1320; I.V.:60] Out: 870 [Urine:850; Chest Tube:20]    PHYSICAL EXAM:  Heart: RRR Lungs: Clear, slightly decreased in bases Wound: Clean and dry   Lab Results: CBC: Recent Labs  11/27/14 0445  WBC 9.4  HGB 10.0*  HCT 31.2*  PLT 278   BMET:  Recent Labs  11/27/14 0445  NA 134*  K 3.5  CL 104  CO2 24  GLUCOSE 80  BUN 12  CREATININE 1.01  CALCIUM 8.1*    PT/INR:  Recent Labs  11/29/14 0330  LABPROT 15.8*  INR 1.24   CXR: stable   Assessment/Plan: S/P Procedure(s) (LRB): VIDEO BRONCHOSCOPY (N/A) VIDEO ASSISTED THORACOSCOPY (Right) EMPYEMA DRAINAGE (N/A) ID- Cx 's negative. D/c  Vanc/Zosyn and switch to po Augmentin.  Pulm- Off oxygen this am, sats 98-99%. H/o recurrent DVT- Continue Coumadin load, continue Lovenox. CKD- Cr stable. HTN- BPs trending up, resume low dose beta blocker. Home possibly in am if he remains stable.    LOS: 4 days    COLLINS,GINA H 11/29/2014

## 2014-11-29 NOTE — Progress Notes (Signed)
Utilization review completed.  

## 2014-11-29 NOTE — Discharge Summary (Signed)
[address]       [place] [address]             [phone]              Discharge Summary  Name: Tony Zhang DOB: May 27, 1946 69 y.o. MRN: [id number]   Admission Date: 11/25/2014 Discharge Date: 11/30/2014    Admitting Diagnosis: Complex right pleural effusion   Discharge Diagnosis:  Complex right pleural effusion Empyema Expected postop blood loss anemia  Past Medical History  Diagnosis Date  . Venous thrombosis     Recurrent  . Peripheral vascular disease   . Dyslipidemia   . AAA (abdominal aortic aneurysm)   . COPD (chronic obstructive pulmonary disease)   . Gout   . DJD (degenerative joint disease)   . Malignant hypertension   . Shortness of breath   . Pneumonia 08/2013    pt. reports that he is having this surgery 11/25/2014- for the lung problem that began with pneumonia in 09/2014  . 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       Procedures: VIDEO BRONCHOSCOPY  -  11/25/2014 RIGHT VIDEO ASSISTED THORACOSCOPY/MINI-THORACOTOMY  Drainage of empyema  Decortication  Pathology:Pleura, peel - BENIGN INFLAMMATORY EXUDATE WITH ABUNDANT FIBRIN AND HISTIOCYTES. - NEGATIVE FOR ATYPIA OR MALIGNANCY.   HPI:  The patient is a 69 y.o. male who in December 2015 was admitted to [facility] for right lower lobe pneumonia. He was treated with antibiotics and steroid with improvement. On follow up evaluation on routine visit with his cardiologist, he was noted to have persistent right sided pleuritic chest pain, fatigue and dyspnea. Chest x-ray showed a persistent small to moderate effusion. CT scan of the chest was performed which demonstrated a complex right posterolateral chest fluid collection suggestive of empyema. The patient was referred to Thoracic Surgery and was seen by Dr. Tyrone Sage.He recommended proceeding with drainage of his effusion and decortication. All risks,  benefits and alternatives of surgery were explained in detail, and the patient agreed to proceed. He was asked to hold his Coumadin, which he takes chronically for history of DVT and PE, prior to surgery.   Hospital Course:  The patient was admitted to [facility] on 11/25/2014. The patient was taken to the operating room and underwent the above procedure.    The postoperative course has generally been uneventful.  Chest tubes have been managed conservatively.  There has been minimal drainage and no air leak.  Chest x-rays have been stable and both chest tubes have been removed. The patient was started on IV Vancomycin and Zosyn.  Intraoperative cultures have remained negative, and the patient has been converted to po Augmentin.  Incisions are healing well.  Coumadin has been restarted and the patient has been bridged with Lovenox in the interim.  He is ambulating in the halls without difficulty and tolerating a regular diet.  He has been treated with aggressive pulmonary toilet measures and has been weaned from supplemental oxygen.  The patient has been evaluated on today's date and is medically stable for discharge home.    Recent vital signs:  Filed Vitals:   11/30/14 0700  BP: 108/67  Pulse: 84  Temp: 97.9 F (36.6 C)  Resp: 19    Recent laboratory studies:  CBC:No results for input(s): WBC, HGB, HCT, PLT in the last 72 hours. BMET: No results for input(s): NA,  K, CL, CO2, GLUCOSE, BUN, CREATININE, CALCIUM in the last 72 hours.  PT/INR:   Recent Labs  11/30/14 0500  LABPROT 15.2  INR 1.19     Discharge Medications:     Medication List    STOP taking these medications        benazepril 40 MG tablet  Commonly known as:  LOTENSIN     losartan-hydrochlorothiazide 100-25 MG per tablet  Commonly known as:  HYZAAR      TAKE these medications        amoxicillin-clavulanate 875-125 MG per tablet  Commonly known as:  AUGMENTIN  Take 1 tablet by mouth 2 (two) times daily.      cilostazol 100 MG tablet  Commonly known as:  PLETAL  Take 1 tablet (100 mg total) by mouth 2 (two) times daily.     fenofibrate 160 MG tablet  Take 1 tablet (160 mg total) by mouth daily.     HYDROcodone-acetaminophen 10-325 MG per tablet  Commonly known as:  NORCO  Take 1 tablet by mouth every 4 (four) hours as needed for severe pain.     ibuprofen 800 MG tablet  Commonly known as:  ADVIL,MOTRIN  Take 800 mg by mouth every 8 (eight) hours as needed for moderate pain.     nebivolol 5 MG tablet  Commonly known as:  BYSTOLIC  Take 1 tablet (5 mg total) by mouth daily.     PROAIR HFA 108 (90 BASE) MCG/ACT inhaler  Generic drug:  albuterol  Inhale 3-4 puffs into the lungs every 4 (four) hours as needed for wheezing or shortness of breath.     simvastatin 40 MG tablet  Commonly known as:  ZOCOR  Take 1 tablet (40 mg total) by mouth daily at 6 PM. MUST KEEP APPOINTMENT FOR FUTURE REFILLS.     warfarin 2.5 MG tablet  Commonly known as:  COUMADIN  - Take 1.25-2.5 mg by mouth daily at 6 PM. Takes 1.25mg  on Wed and Fri  - Takes 2.5mg  all other days         Discharge Instructions:  The patient is to refrain from driving, heavy lifting or strenuous activity.  May shower daily and clean incisions with soap and water.  May resume regular diet.   Follow Up: Follow-up Information    Follow up with Delight OvensGERHARDT,EDWARD B, MD.   Specialty:  Cardiothoracic Surgery   Why:  PA[facility]LAT CX[facility] taken ([facility] which is in the same building as Dr. Dennie MaizesGerhardt's office) on 12/23/2014 at 1:30pm;Appointment with Dr. Tyrone SageGerhardt is at 2:30 pm   Contact information:   [address] [place] [address] [phone]       Follow up with Nurrse On 12/05/2014.   Why:  Appointment is with nurse only for suture removal. Appointment time is at 11:30 am   Contact information:   [address] [place] [address] [phone]      Follow up with Thornton PapasSHERRILL, GARY, MD.    Specialty:  Oncology   Why:  Call to have PT and INR (as is on Coumadin) drawn on Monday 12/02/2014   Contact information:   [address] [place] [address] [phone]       Elenore RotaZIMMERMAN,DONIELLE M PA-C 11/30/2014, 10:11 AM

## 2014-11-30 LAB — PROTIME-INR
INR: 1.19 (ref 0.00–1.49)
PROTHROMBIN TIME: 15.2 s (ref 11.6–15.2)

## 2014-11-30 MED ORDER — AMOXICILLIN-POT CLAVULANATE 875-125 MG PO TABS: 1.0000 | ORAL_TABLET | Freq: Two times a day (BID) | ORAL | Status: AC

## 2014-11-30 MED ORDER — NEBIVOLOL HCL 5 MG PO TABS: 5.0000 mg | ORAL_TABLET | Freq: Every day | ORAL | Status: DC

## 2014-11-30 MED ORDER — HYDROCODONE-ACETAMINOPHEN 10-325 MG PO TABS: 1.0000 | ORAL_TABLET | ORAL | Status: DC | PRN

## 2014-11-30 MED ORDER — NEBIVOLOL HCL 5 MG PO TABS: 10.0000 mg | ORAL_TABLET | Freq: Every day | ORAL | Status: DC

## 2014-11-30 NOTE — Progress Notes (Signed)
R. IJ removed per MD order. Patient tolerated procedure well. Will continue to monitor.

## 2014-11-30 NOTE — Progress Notes (Addendum)
      [address]       [place] [id number]             [phone]       5 Days Post-Op Procedure(s) (LRB): VIDEO BRONCHOSCOPY (N/A) VIDEO ASSISTED THORACOSCOPY (Right) EMPYEMA DRAINAGE (N/A)  Subjective: Patient without complaints  Objective: Vital signs in last 24 hours: Temp:  [97.9 F (36.6 C)-100 F (37.8 C)] 97.9 F (36.6 C) (02/13 0700) Pulse Rate:  [84-101] 84 (02/13 0341) Cardiac Rhythm:  [-] Normal sinus rhythm (02/13 0800) Resp:  [17-30] 19 (02/13 0341) BP: (96-138)/(64-82) 108/67 mmHg (02/13 0341) SpO2:  [90 %-94 %] 90 % (02/13 0341)     Intake/Output from previous day: 02/12 0701 - 02/13 0700 In: 1000 [P.O.:1000] Out: 1450 [Urine:1450]   Physical Exam:  Cardiovascular: RRR Pulmonary: Clear to auscultation on left and slightly diminished on right; no rales, wheezes, or rhonchi. Abdomen: Soft, non tender, bowel sounds present. Extremities: Trace  bilateral lower extremity edema. Wounds: Clean and dry.  No erythema or signs of infection.   Lab Results: CBC:No results for input(s): WBC, HGB, HCT, PLT in the last 72 hours. BMET: No results for input(s): NA, K, CL, CO2, GLUCOSE, BUN, CREATININE, CALCIUM in the last 72 hours.  PT/INR:  Recent Labs  11/30/14 0500  LABPROT 15.2  INR 1.19   ABG:  INR: Will add last result for INR, ABG once components are confirmed Will add last 4 CBG results once components are confirmed  Assessment/Plan:  1. CV - SR in the 80's. On Bystolic 5 daily and Coumadin (history of blood dyscrasia, DVT). INR decreased from 1.24 to 1.19. Will resume home dose Coumadin. He needs to call Monday to have PT and INR checked at Dr. Kalman DrapeSherrill's office. 2.  Pulmonary - On room air. CXR shows no pneumothorax, small pleural effusions (R>L),a nd emphysematous changes. Encourage incentive spriometer 3. ID-On Augmentin. Will continue for total of 10 days 4. Remove central line 5. As discussed with Dr. Laneta SimmersBartle, ok to  discharge  ZIMMERMAN,DONIELLE MPA-C 11/30/2014,9:24 AM

## 2014-11-30 NOTE — Progress Notes (Signed)
Discharge paperwork reviewed with patient. Education given. Pt verbally repeated and understood prescribed medication schedule as well as future appointments. Pt states he has had no further questions.

## 2014-11-30 NOTE — Discharge Instructions (Signed)
Thoracotomy, Care After °Refer to this sheet in the next few weeks. These instructions provide you with information on caring for yourself after your procedure. Your health care provider may also give you more specific instructions. Your treatment has been planned according to current medical practices, but problems sometimes occur. Call your health care provider if you have any problems or questions after your procedure. °WHAT TO EXPECT AFTER YOUR PROCEDURE °After your procedure, it is typical to have the following sensations: °· You may feel pain at the incision site. °· You may be constipated from the pain medicine given and the change in your level of activity. °· You may feel extremely tired. °HOME CARE INSTRUCTIONS °· Take over-the-counter or prescription medicines for pain, discomfort, or fever only as directed by your health care provider. It is very important to take pain relieving medicine before your pain becomes severe. You will be able to breathe and cough more comfortably if your pain is well controlled. °· Take deep breaths. Deep breathing helps to keep your lungs inflated and protects against a lung infection (pneumonia). °· Cough frequently. Even though coughing may cause discomfort, coughing is important to clear mucus (phlegm) and expand your lungs. Coughing helps prevent pneumonia. If it hurts to cough, hold a pillow against your chest when you cough. This may help with the discomfort. °· Continue to use an incentive spirometer as directed. The use of an incentive spirometer helps to keep your lungs inflated and protects against pneumonia. °· Change the bandages over your incision as needed or as directed by your health care provider. °· Remove the bandages over your chest tube site as directed by your health care provider. °· Resume your normal diet as directed. It is important to have adequate protein, calories, vitamins, and minerals to promote healing. °· Prevent constipation. °¨ Eat  high-fiber foods such as whole grain cereals and breads, brown rice, beans, and fresh fruits and vegetables. °¨ Drink enough water and fluids to keep your urine clear or pale yellow. Avoid drinking beverages containing caffeine. Beverages containing caffeine can cause dehydration and harden your stool. °¨ Talk to your health care provider about taking a stool softener or laxative. °· Avoid lifting until you are instructed otherwise. °· Do not drive until directed by your health care provider.  Do not drive while taking pain medicines (narcotics). °· Do not bathe, swim, or use a hot tub until directed by your health care provider. You may shower instead. Gently wash the area of your incision with water and soap as directed. Do not use anything else to clean your incision except as directed by your health care provider. °· Do not use any tobacco products including cigarettes, chewing tobacco, or electronic cigarettes. °· Avoid secondhand smoke. °· Schedule an appointment for stitch (suture) or staple removal as directed. °· Schedule and attend all follow-up visits as directed by your health care provider. It is important to keep all your appointments. °· Participate in pulmonary rehabilitation as directed by your health care provider. °· Do not travel by airplane for 2 weeks after your chest tube is removed. °SEEK MEDICAL CARE IF: °· You are bleeding from your wounds. °· Your heartbeat seems irregular. °· You have redness, swelling, or increasing pain in the wounds. °· There is pus coming from your wounds. °· There is a bad smell coming from the wound or dressing. °· You have a fever or chills. °· You have nausea or are vomiting. °· You have muscle aches. °SEEK   IMMEDIATE MEDICAL CARE IF: °· You have a rash. °· You have difficulty breathing. °· You have a reaction or side effect to medicines given. °· You have persistent nausea. °· You have lightheadedness or feel faint. °· You have shortness of breath or chest  pain. °· You have persistent pain. °Document Released: 03/19/2011 Document Revised: 10/09/2013 Document Reviewed: 05/23/2013 °ExitCare® Patient Information ©2015 [employer]. This information is not intended to replace advice given to you by your health care provider. Make sure you discuss any questions you have with your health care provider. ° °

## 2014-12-01 LAB — ANAEROBIC CULTURE

## 2014-12-02 ENCOUNTER — Telehealth: Payer: Self-pay | Admitting: Pharmacist

## 2014-12-02 ENCOUNTER — Institutional Professional Consult (permissible substitution): Payer: Medicare Other | Admitting: Internal Medicine

## 2014-12-02 NOTE — Telephone Encounter (Signed)
[name] called to schedule coumadin/INR check. He was recently discharged from hospital after procedure. He was off of coumadin and recently resumed at his home dose ([facility] He was also started on 10 day course of antibiotics on 2/11 (Augmentin). [name] would like to come in on Tuesday 12/03/14 at 11:30am. Message sent to scheduling  Thank you.  Christell Faithhris Rubi Tooley, PharmD, BCOP

## 2014-12-03 ENCOUNTER — Other Ambulatory Visit (HOSPITAL_BASED_OUTPATIENT_CLINIC_OR_DEPARTMENT_OTHER): Payer: Medicare Other

## 2014-12-03 ENCOUNTER — Ambulatory Visit (HOSPITAL_BASED_OUTPATIENT_CLINIC_OR_DEPARTMENT_OTHER): Payer: Medicare Other | Admitting: Pharmacist

## 2014-12-03 DIAGNOSIS — Z7901 Long term (current) use of anticoagulants: Secondary | ICD-10-CM

## 2014-12-03 DIAGNOSIS — Z86718 Personal history of other venous thrombosis and embolism: Secondary | ICD-10-CM

## 2014-12-03 DIAGNOSIS — I82409 Acute embolism and thrombosis of unspecified deep veins of unspecified lower extremity: Secondary | ICD-10-CM

## 2014-12-03 LAB — PROTIME-INR
INR: 1.5 — ABNORMAL LOW (ref 2.00–3.50)
PROTIME: 18 s — AB (ref 10.6–13.4)

## 2014-12-03 LAB — POCT INR: INR: 1.5

## 2014-12-03 NOTE — Progress Notes (Signed)
Pt seen in clinic today INR=1.5 Pt is a week into restarting his coumadin after an inpt surgery stay He had 2 doses of lovenox to bridge him inpt when they restarted coumadin. He has not had lovenox for almost a week Dr. [name] aware of todays INR and wishes to keep him on is usual dose and not give lovenox He has 4 days left of his Augmentin course He has been taking motrin 800 mg tid for quite sometime No other changes to report  Continue 2.5 mg daily except 1.25 mg (1/2 tablet) on Wednesdays & Fridays Recheck INR on 12/18/14 at  10:30am for lab and 10:45am for coumadin clinic.

## 2014-12-03 NOTE — Patient Instructions (Signed)
Continue 2.5 mg daily except 1.25 mg (1/2 tablet) on Wednesdays & Fridays Recheck INR on 12/18/14 at  10:30am for lab and 10:45am for coumadin clinic.

## 2014-12-05 ENCOUNTER — Ambulatory Visit: Payer: Self-pay | Admitting: *Deleted

## 2014-12-05 DIAGNOSIS — J869 Pyothorax without fistula: Secondary | ICD-10-CM

## 2014-12-05 DIAGNOSIS — Z4802 Encounter for removal of sutures: Secondary | ICD-10-CM

## 2014-12-05 NOTE — Progress Notes (Signed)
[name] returns for suture removal of two previous chest tube sites.  These as well as the right thoracotomy incision are very well healed.  He continues to take the narcotic med that is chronically presrcibed by another provider alternating with ibuprofen. He is doing well.  He will return as scheduled with a chest xray.

## 2014-12-06 ENCOUNTER — Other Ambulatory Visit: Payer: Self-pay | Admitting: Cardiovascular Disease

## 2014-12-06 NOTE — Telephone Encounter (Signed)
Rx(s) sent to pharmacy electronically.  

## 2014-12-11 ENCOUNTER — Other Ambulatory Visit: Payer: Self-pay | Admitting: Cardiovascular Disease

## 2014-12-12 ENCOUNTER — Telehealth: Payer: Self-pay | Admitting: Cardiovascular Disease

## 2014-12-12 NOTE — Telephone Encounter (Signed)
Jonny RuizJohn is calling in to speak with the nurse about the pt's Fenofibrate script. He noticed that the pt just picked up a script for simvastatin and wanted to know if she should be taking both meds or discontinuing one of them.  Thanks

## 2014-12-12 NOTE — Telephone Encounter (Signed)
Rx(s) sent to pharmacy electronically.  

## 2014-12-12 NOTE — Telephone Encounter (Signed)
Advised that as of last visit, pt currently prescribed both meds. Encounter closed.

## 2014-12-18 ENCOUNTER — Ambulatory Visit (HOSPITAL_BASED_OUTPATIENT_CLINIC_OR_DEPARTMENT_OTHER): Payer: Medicare Other | Admitting: Pharmacist

## 2014-12-18 ENCOUNTER — Other Ambulatory Visit: Payer: Self-pay | Admitting: Cardiothoracic Surgery

## 2014-12-18 ENCOUNTER — Other Ambulatory Visit (HOSPITAL_BASED_OUTPATIENT_CLINIC_OR_DEPARTMENT_OTHER): Payer: Medicare Other

## 2014-12-18 DIAGNOSIS — Z86718 Personal history of other venous thrombosis and embolism: Secondary | ICD-10-CM

## 2014-12-18 DIAGNOSIS — Z7901 Long term (current) use of anticoagulants: Secondary | ICD-10-CM

## 2014-12-18 DIAGNOSIS — J90 Pleural effusion, not elsewhere classified: Secondary | ICD-10-CM

## 2014-12-18 DIAGNOSIS — I82409 Acute embolism and thrombosis of unspecified deep veins of unspecified lower extremity: Secondary | ICD-10-CM

## 2014-12-18 LAB — PROTIME-INR
INR: 1.8 — ABNORMAL LOW (ref 2.00–3.50)
PROTIME: 21.6 s — AB (ref 10.6–13.4)

## 2014-12-18 LAB — POCT INR: INR: 1.8

## 2014-12-18 NOTE — Progress Notes (Signed)
INR = 1.8 on Coumadin 2.5 mg daily except 1.25 mg on Wed/Fri. Pt c/o gout flare (started over w/e), bilat axillary LN swelling, sweats, chills, weakness, palor, ribs hurt when he takes deep breath. Pt seeing PCP tomorrow & he plans to ask for abx at that visit. He is seeing surgeon on Monday. No recent med changes (possible abx starting tomorrow or Monday). INR low again today.  We've discussed increasing Coumadin to 2.5 mg daily & pt agrees to call us if he starts abx. Return in 3 weeks (pt request). Ebony HailGinna Amaree Leeper, Pharm.D., CPP 12/18/2014@10 :41 AM

## 2014-12-23 ENCOUNTER — Encounter: Payer: Self-pay | Admitting: Cardiothoracic Surgery

## 2014-12-23 ENCOUNTER — Ambulatory Visit
Admission: RE | Admit: 2014-12-23 | Discharge: 2014-12-23 | Disposition: A | Discharge status: Home / Self Care | Payer: Medicare Other | Source: Ambulatory Visit | Attending: Cardiothoracic Surgery | Admitting: Cardiothoracic Surgery

## 2014-12-23 ENCOUNTER — Ambulatory Visit (INDEPENDENT_AMBULATORY_CARE_PROVIDER_SITE_OTHER): Payer: Self-pay | Admitting: Cardiothoracic Surgery

## 2014-12-23 VITALS — BP 130/64 | HR 96 | Resp 20 | Ht 73.0 in | Wt 219.0 lb

## 2014-12-23 DIAGNOSIS — J90 Pleural effusion, not elsewhere classified: Secondary | ICD-10-CM

## 2014-12-23 DIAGNOSIS — J869 Pyothorax without fistula: Secondary | ICD-10-CM

## 2014-12-23 LAB — FUNGUS CULTURE W SMEAR
Fungal Smear: NONE SEEN
Fungal Smear: NONE SEEN
Fungal Smear: NONE SEEN

## 2014-12-23 NOTE — Progress Notes (Signed)
[address]       [place] [address]             [phone]      KUPONO MARLING [facility] Medical Record #[id number] Date of Birth: [birth year]  Referring: Thurmon Fair, MD Primary Care: [name], MD  Chief Complaint:   POST OP FOLLOW UP 11/25/2014 OPERATIVE REPORT PREOPERATIVE DIAGNOSIS: Recent hospitalization for pneumonia now with complex right chest effusion. POSTOPERATIVE DIAGNOSIS: Recent hospitalization for pneumonia now with complex right chest effusion, empyema. PROCEDURE PERFORMED: Bronchoscopy, right video-assisted thoracoscopy, mini thoracotomy, drainage of empyema, and decortication of right lower chest. SURGEON: Sheliah Plane, MD  Diagnosis Pleura, peel - BENIGN INFLAMMATORY EXUDATE WITH ABUNDANT FIBRIN AND HISTIOCYTES. - NEGATIVE FOR ATYPIA OR MALIGNANCY. [place] RUND DO Pathologist, Electronic Signature (Case signed 11/26/2014)  History of Present Illness:     Patient making good progress after recent VATS drainage of a complex right pleural effusion. A few days after discharge home the patient return to work. He recently saw his primary care doctor because of some increased cough and was started on 7 days of by mouth antibiotics, he has 2 additional days left. He denies any fever chills or cough.     Past Medical History  Diagnosis Date  . Venous thrombosis     Recurrent  . Peripheral vascular disease   . Dyslipidemia   . AAA (abdominal aortic aneurysm)   . COPD (chronic obstructive pulmonary disease)   . Gout   . DJD (degenerative joint disease)   . Malignant hypertension   . Shortness of breath   . Pneumonia 08/2013    pt. reports that he is having this surgery 11/25/2014- for the lung problem that began with pneumonia in 09/2014  . 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       History  Smoking status  . Former Smoker  . Quit date: 10/18/1992   Smokeless tobacco  . Never Used    History  Alcohol Use No     Allergies  Allergen Reactions  . Codeine Hives and Other (See Comments)    Takes benadryl to stop reaction  . Amlodipine Other (See Comments)    Muscle weakness    Current Outpatient Prescriptions  Medication Sig Dispense Refill  . cilostazol (PLETAL) 100 MG tablet Take 1 tablet (100 mg total) by mouth 2 (two) times daily. 180 tablet 2  . fenofibrate 160 MG tablet TAKE 1 TABLET BY MOUTH DAILY 30 tablet 10  . HYDROcodone-acetaminophen (NORCO) 10-325 MG per tablet Take 1 tablet by mouth every 4 (four) hours as needed for severe pain. 30 tablet 0  . ibuprofen (ADVIL,MOTRIN) 800 MG tablet Take 800 mg by mouth every 8 (eight) hours as needed for moderate pain.    . nebivolol (BYSTOLIC) 5 MG tablet Take 1 tablet (5 mg total) by mouth daily. 30 tablet 1  . PROAIR HFA 108 (90 BASE) MCG/ACT inhaler Inhale 3-4 puffs into the lungs every 4 (four) hours as needed for wheezing or shortness of breath.     . simvastatin (ZOCOR) 40 MG tablet Take 1 tablet (40 mg total) by mouth daily at 6 PM. 30 tablet 11  . warfarin (COUMADIN) 2.5 MG tablet Take 1.25-2.5 mg by mouth daily at 6 PM. Takes 1.25mg  on Wed and Fri Takes 2.5mg  all other days     No current facility-administered medications for this  visit.       Physical Exam: BP 130/64 mmHg  Pulse 96  Resp 20  Ht 6\' 1"  (1.854 m)  Wt 219 lb (99.338 kg)  BMI 28.90 kg/m2  SpO2 95%  General appearance: alert and cooperative Neurologic: intact Heart: regular rate and rhythm, S1, S2 normal, no murmur, click, rub or gallop Lungs: diminished breath sounds RLL Abdomen: soft, non-tender; bowel sounds normal; no masses,  no organomegaly Extremities: extremities normal, atraumatic, no cyanosis or edema and Homans sign is negative, no sign of DVT Wound: Right VATS incisions are well-healed   Diagnostic Studies & Laboratory data:     Recent Radiology Findings:   Dg Chest 2  View  12/23/2014   CLINICAL DATA:  69 year old male status post VATS in February. Shortness of breath, right chest soreness. Plural effusion. Subsequent encounter.  EXAM: CHEST  2 VIEW  COMPARISON:  11/29/2014 and earlier.  FINDINGS: Right IJ approach central line removed. Stable lung volumes. Patchy right lung base opacity in part related to a small residual pleural effusion has not significantly changed. No areas of worsening ventilation. Stable cardiac size and mediastinal contours. No pneumothorax. No acute osseous abnormality identified.  IMPRESSION: Stable with persistent right lung base and pleural-based densities.   Electronically Signed   By: Odessa FlemingH  Hall M.D.   On: 12/23/2014 16:04      Recent Lab Findings: Lab Results  Component Value Date   WBC 9.4 11/27/2014   HGB 10.0* 11/27/2014   HCT 31.2* 11/27/2014   PLT 278 11/27/2014   GLUCOSE 80 11/27/2014   CHOL 153 09/17/2013   TRIG 298* 09/17/2013   HDL 18* 09/17/2013   LDLCALC 75 09/17/2013   ALT 9 11/27/2014   AST 17 11/27/2014   NA 134* 11/27/2014   K 3.5 11/27/2014   CL 104 11/27/2014   CREATININE 1.01 11/27/2014   BUN 12 11/27/2014   CO2 24 11/27/2014   TSH 2.226 06/25/2009   INR 1.80* 12/18/2014      Assessment / Plan:    Patient stable after recent drainage of complex right pleural effusion with video-assisted thoracoscopy. We'll plan see the patient back in 3 months with a follow-up CT T scan, he has multiple less than 7 mm pulmonary nodules that will need to be followed.    Delight OvensEdward B Darvell Monteforte MD      [address] [place] [id number] Office [phone]   Beeper [phone]  12/23/2014 5:05 PM

## 2015-01-07 LAB — AFB CULTURE WITH SMEAR (NOT AT ARMC)
Acid Fast Smear: NONE SEEN
Acid Fast Smear: NONE SEEN
Acid Fast Smear: NONE SEEN

## 2015-01-08 ENCOUNTER — Other Ambulatory Visit (HOSPITAL_BASED_OUTPATIENT_CLINIC_OR_DEPARTMENT_OTHER): Payer: Medicare Other

## 2015-01-08 ENCOUNTER — Ambulatory Visit (HOSPITAL_BASED_OUTPATIENT_CLINIC_OR_DEPARTMENT_OTHER): Payer: Self-pay | Admitting: Pharmacist

## 2015-01-08 DIAGNOSIS — I82409 Acute embolism and thrombosis of unspecified deep veins of unspecified lower extremity: Secondary | ICD-10-CM

## 2015-01-08 DIAGNOSIS — Z86718 Personal history of other venous thrombosis and embolism: Secondary | ICD-10-CM

## 2015-01-08 LAB — PROTIME-INR
INR: 2.7 (ref 2.00–3.50)
Protime: 32.4 Seconds — ABNORMAL HIGH (ref 10.6–13.4)

## 2015-01-08 LAB — POCT INR: INR: 2.7

## 2015-01-08 NOTE — Patient Instructions (Signed)
Continue Coumadin 2.5 mg daily  Recheck INR on 02/05/15 at 11 am for lab and 11:15 am for coumadin clinic.

## 2015-01-08 NOTE — Progress Notes (Signed)
Pt seen in clinic today INR=2.7 Pt has no changes to report Diet consistent No refills needed No s/s bleeding Continue Coumadin 2.5 mg daily  Recheck INR on 02/05/15 at 11 am for lab and 11:15 am for coumadin clinic.

## 2015-01-30 ENCOUNTER — Other Ambulatory Visit: Payer: Self-pay | Admitting: Oncology

## 2015-01-31 ENCOUNTER — Other Ambulatory Visit: Payer: Self-pay | Admitting: Oncology

## 2015-02-04 ENCOUNTER — Other Ambulatory Visit: Payer: Self-pay | Admitting: Oncology

## 2015-02-05 ENCOUNTER — Ambulatory Visit (HOSPITAL_BASED_OUTPATIENT_CLINIC_OR_DEPARTMENT_OTHER): Payer: Medicare Other | Admitting: Pharmacist

## 2015-02-05 ENCOUNTER — Other Ambulatory Visit (HOSPITAL_BASED_OUTPATIENT_CLINIC_OR_DEPARTMENT_OTHER): Payer: Medicare Other

## 2015-02-05 DIAGNOSIS — I82402 Acute embolism and thrombosis of unspecified deep veins of left lower extremity: Secondary | ICD-10-CM | POA: Diagnosis present

## 2015-02-05 DIAGNOSIS — Z7901 Long term (current) use of anticoagulants: Secondary | ICD-10-CM | POA: Diagnosis not present

## 2015-02-05 DIAGNOSIS — I82409 Acute embolism and thrombosis of unspecified deep veins of unspecified lower extremity: Secondary | ICD-10-CM

## 2015-02-05 LAB — PROTIME-INR
INR: 3.2 (ref 2.00–3.50)
PROTIME: 38.4 s — AB (ref 10.6–13.4)

## 2015-02-05 LAB — POCT INR: INR: 3.2

## 2015-02-05 NOTE — Progress Notes (Signed)
INR = 3.2 on Coumadin 2.5 mg daily He only c/o bilateral rib pain.  He takes 1/2 tab of Norco nightly for this to help him sleep. No recent med changes.  Not currently on abx. No bleeding/bruising. INR just slightly above goal today but was therapeutic last visit.  I have instructed him to take 1/2 tablet today only then resume 2.5 mg daily. Return in 1 month.  If INR high again then, consider adding back in 1 or 2 days of 1.25 mg. Ebony HailGinna Arnisha Laffoon, Pharm.D., CPP 02/05/2015@11 :12 AM

## 2015-02-18 ENCOUNTER — Other Ambulatory Visit: Payer: Self-pay | Admitting: Cardiovascular Disease

## 2015-03-05 ENCOUNTER — Other Ambulatory Visit: Payer: Medicare Other

## 2015-03-05 ENCOUNTER — Ambulatory Visit (HOSPITAL_BASED_OUTPATIENT_CLINIC_OR_DEPARTMENT_OTHER): Payer: Self-pay | Admitting: Pharmacist

## 2015-03-05 DIAGNOSIS — Z7901 Long term (current) use of anticoagulants: Secondary | ICD-10-CM

## 2015-03-05 DIAGNOSIS — I82409 Acute embolism and thrombosis of unspecified deep veins of unspecified lower extremity: Secondary | ICD-10-CM

## 2015-03-05 DIAGNOSIS — Z86718 Personal history of other venous thrombosis and embolism: Secondary | ICD-10-CM

## 2015-03-05 DIAGNOSIS — I82402 Acute embolism and thrombosis of unspecified deep veins of left lower extremity: Secondary | ICD-10-CM

## 2015-03-05 LAB — PROTIME-INR
INR: 2.7 (ref 2.00–3.50)
PROTIME: 32.4 s — AB (ref 10.6–13.4)

## 2015-03-05 LAB — POCT INR: INR: 2.7

## 2015-03-05 MED ORDER — WARFARIN SODIUM 2.5 MG PO TABS: ORAL_TABLET | ORAL | Status: DC

## 2015-03-05 NOTE — Progress Notes (Signed)
INR within goal today. Pt took coumadin as instructed at last visit. No missed or extra coumadin doses. No significant changes in his diet. He is eating more meat. No unusual bruising. Some scratches from cutting down a tree. Usual, nothing concerning. No s/s of clotting noted. Continue Coumadin 2.5 mg daily. Recheck INR on 04/09/15 at 11 am for lab and 11:15 am for coumadin clinic.

## 2015-03-05 NOTE — Patient Instructions (Signed)
Continue Coumadin 2.5 mg daily. Recheck INR on 04/09/15 at 11 am for lab and 11:15 am for coumadin clinic.

## 2015-03-21 ENCOUNTER — Other Ambulatory Visit: Payer: Self-pay | Admitting: Family Medicine

## 2015-03-21 ENCOUNTER — Other Ambulatory Visit: Payer: Self-pay | Admitting: Cardiothoracic Surgery

## 2015-03-21 DIAGNOSIS — R911 Solitary pulmonary nodule: Secondary | ICD-10-CM

## 2015-03-25 ENCOUNTER — Ambulatory Visit
Admission: RE | Admit: 2015-03-25 | Discharge: 2015-03-25 | Disposition: A | Discharge status: Home / Self Care | Payer: Medicare Other | Source: Ambulatory Visit | Attending: Family Medicine | Admitting: Family Medicine

## 2015-03-25 DIAGNOSIS — R911 Solitary pulmonary nodule: Secondary | ICD-10-CM

## 2015-03-25 MED ORDER — IOHEXOL 300 MG/ML  SOLN
50.0000 mL | Freq: Once | INTRAMUSCULAR | Status: AC | PRN
  Administered 2015-03-25: 50 mL via INTRAVENOUS

## 2015-04-08 ENCOUNTER — Emergency Department (HOSPITAL_COMMUNITY): Payer: Medicare Other

## 2015-04-08 ENCOUNTER — Encounter (HOSPITAL_COMMUNITY): Payer: Self-pay

## 2015-04-08 ENCOUNTER — Inpatient Hospital Stay (HOSPITAL_COMMUNITY)
Admission: EM | Admit: 2015-04-08 | Discharge: 2015-04-14 | DRG: 813 | Disposition: A | Discharge status: Home / Self Care | Payer: Medicare Other | Attending: Internal Medicine | Admitting: Internal Medicine

## 2015-04-08 DIAGNOSIS — I739 Peripheral vascular disease, unspecified: Secondary | ICD-10-CM | POA: Diagnosis present

## 2015-04-08 DIAGNOSIS — I714 Abdominal aortic aneurysm, without rupture, unspecified: Secondary | ICD-10-CM | POA: Diagnosis present

## 2015-04-08 DIAGNOSIS — Z791 Long term (current) use of non-steroidal anti-inflammatories (NSAID): Secondary | ICD-10-CM | POA: Diagnosis not present

## 2015-04-08 DIAGNOSIS — E780 Pure hypercholesterolemia: Secondary | ICD-10-CM | POA: Diagnosis present

## 2015-04-08 DIAGNOSIS — M109 Gout, unspecified: Secondary | ICD-10-CM | POA: Diagnosis present

## 2015-04-08 DIAGNOSIS — M1000 Idiopathic gout, unspecified site: Secondary | ICD-10-CM | POA: Diagnosis present

## 2015-04-08 DIAGNOSIS — K922 Gastrointestinal hemorrhage, unspecified: Secondary | ICD-10-CM | POA: Diagnosis not present

## 2015-04-08 DIAGNOSIS — G4733 Obstructive sleep apnea (adult) (pediatric): Secondary | ICD-10-CM | POA: Diagnosis present

## 2015-04-08 DIAGNOSIS — R791 Abnormal coagulation profile: Secondary | ICD-10-CM | POA: Diagnosis not present

## 2015-04-08 DIAGNOSIS — T45515A Adverse effect of anticoagulants, initial encounter: Secondary | ICD-10-CM | POA: Diagnosis present

## 2015-04-08 DIAGNOSIS — Z7902 Long term (current) use of antithrombotics/antiplatelets: Secondary | ICD-10-CM | POA: Diagnosis not present

## 2015-04-08 DIAGNOSIS — E876 Hypokalemia: Secondary | ICD-10-CM | POA: Diagnosis not present

## 2015-04-08 DIAGNOSIS — D509 Iron deficiency anemia, unspecified: Secondary | ICD-10-CM | POA: Diagnosis present

## 2015-04-08 DIAGNOSIS — I959 Hypotension, unspecified: Secondary | ICD-10-CM | POA: Diagnosis present

## 2015-04-08 DIAGNOSIS — I2510 Atherosclerotic heart disease of native coronary artery without angina pectoris: Secondary | ICD-10-CM | POA: Diagnosis present

## 2015-04-08 DIAGNOSIS — E781 Pure hyperglyceridemia: Secondary | ICD-10-CM | POA: Diagnosis present

## 2015-04-08 DIAGNOSIS — I701 Atherosclerosis of renal artery: Secondary | ICD-10-CM | POA: Diagnosis present

## 2015-04-08 DIAGNOSIS — K219 Gastro-esophageal reflux disease without esophagitis: Secondary | ICD-10-CM | POA: Diagnosis present

## 2015-04-08 DIAGNOSIS — I129 Hypertensive chronic kidney disease with stage 1 through stage 4 chronic kidney disease, or unspecified chronic kidney disease: Secondary | ICD-10-CM | POA: Diagnosis present

## 2015-04-08 DIAGNOSIS — M1990 Unspecified osteoarthritis, unspecified site: Secondary | ICD-10-CM | POA: Diagnosis present

## 2015-04-08 DIAGNOSIS — T39395A Adverse effect of other nonsteroidal anti-inflammatory drugs [NSAID], initial encounter: Secondary | ICD-10-CM | POA: Diagnosis present

## 2015-04-08 DIAGNOSIS — E538 Deficiency of other specified B group vitamins: Secondary | ICD-10-CM | POA: Diagnosis present

## 2015-04-08 DIAGNOSIS — Z86718 Personal history of other venous thrombosis and embolism: Secondary | ICD-10-CM | POA: Diagnosis not present

## 2015-04-08 DIAGNOSIS — Z87891 Personal history of nicotine dependence: Secondary | ICD-10-CM

## 2015-04-08 DIAGNOSIS — J42 Unspecified chronic bronchitis: Secondary | ICD-10-CM

## 2015-04-08 DIAGNOSIS — I82409 Acute embolism and thrombosis of unspecified deep veins of unspecified lower extremity: Secondary | ICD-10-CM | POA: Diagnosis present

## 2015-04-08 DIAGNOSIS — R0602 Shortness of breath: Secondary | ICD-10-CM | POA: Diagnosis present

## 2015-04-08 DIAGNOSIS — K640 First degree hemorrhoids: Secondary | ICD-10-CM | POA: Diagnosis present

## 2015-04-08 DIAGNOSIS — J9811 Atelectasis: Secondary | ICD-10-CM | POA: Diagnosis present

## 2015-04-08 DIAGNOSIS — K921 Melena: Secondary | ICD-10-CM | POA: Diagnosis present

## 2015-04-08 DIAGNOSIS — D6832 Hemorrhagic disorder due to extrinsic circulating anticoagulants: Principal | ICD-10-CM | POA: Diagnosis present

## 2015-04-08 DIAGNOSIS — N182 Chronic kidney disease, stage 2 (mild): Secondary | ICD-10-CM | POA: Diagnosis present

## 2015-04-08 DIAGNOSIS — Z885 Allergy status to narcotic agent status: Secondary | ICD-10-CM

## 2015-04-08 DIAGNOSIS — K259 Gastric ulcer, unspecified as acute or chronic, without hemorrhage or perforation: Secondary | ICD-10-CM | POA: Diagnosis present

## 2015-04-08 DIAGNOSIS — J449 Chronic obstructive pulmonary disease, unspecified: Secondary | ICD-10-CM | POA: Diagnosis present

## 2015-04-08 DIAGNOSIS — D62 Acute posthemorrhagic anemia: Secondary | ICD-10-CM | POA: Insufficient documentation

## 2015-04-08 DIAGNOSIS — I82402 Acute embolism and thrombosis of unspecified deep veins of left lower extremity: Secondary | ICD-10-CM | POA: Diagnosis not present

## 2015-04-08 DIAGNOSIS — Z7901 Long term (current) use of anticoagulants: Secondary | ICD-10-CM

## 2015-04-08 DIAGNOSIS — N189 Chronic kidney disease, unspecified: Secondary | ICD-10-CM | POA: Diagnosis present

## 2015-04-08 DIAGNOSIS — E785 Hyperlipidemia, unspecified: Secondary | ICD-10-CM | POA: Diagnosis present

## 2015-04-08 DIAGNOSIS — Z86711 Personal history of pulmonary embolism: Secondary | ICD-10-CM

## 2015-04-08 DIAGNOSIS — N179 Acute kidney failure, unspecified: Secondary | ICD-10-CM | POA: Diagnosis present

## 2015-04-08 DIAGNOSIS — J441 Chronic obstructive pulmonary disease with (acute) exacerbation: Secondary | ICD-10-CM | POA: Diagnosis present

## 2015-04-08 LAB — BASIC METABOLIC PANEL
Anion gap: 16 — ABNORMAL HIGH (ref 5–15)
BUN: 114 mg/dL — ABNORMAL HIGH (ref 6–20)
CO2: 13 mmol/L — AB (ref 22–32)
CREATININE: 3.42 mg/dL — AB (ref 0.61–1.24)
Calcium: 8.7 mg/dL — ABNORMAL LOW (ref 8.9–10.3)
Chloride: 107 mmol/L (ref 101–111)
GFR calc Af Amer: 20 mL/min — ABNORMAL LOW (ref >60)
GFR, EST NON AFRICAN AMERICAN: 17 mL/min — AB (ref >60)
Glucose, Bld: 148 mg/dL — ABNORMAL HIGH (ref 65–99)
Potassium: 3.8 mmol/L (ref 3.5–5.1)
Sodium: 136 mmol/L (ref 135–145)

## 2015-04-08 LAB — CBC
HCT: 26 % — ABNORMAL LOW (ref 39.0–52.0)
Hemoglobin: 8.5 g/dL — ABNORMAL LOW (ref 13.0–17.0)
MCH: 25.4 pg — AB (ref 26.0–34.0)
MCHC: 32.7 g/dL (ref 30.0–36.0)
MCV: 77.6 fL — ABNORMAL LOW (ref 78.0–100.0)
Platelets: 353 10*3/uL (ref 150–400)
RBC: 3.35 MIL/uL — ABNORMAL LOW (ref 4.22–5.81)
RDW: 19 % — ABNORMAL HIGH (ref 11.5–15.5)
WBC: 14.7 10*3/uL — ABNORMAL HIGH (ref 4.0–10.5)

## 2015-04-08 LAB — PREPARE RBC (CROSSMATCH)

## 2015-04-08 LAB — PROTIME-INR
INR: 4.29 — AB (ref 0.00–1.49)
Prothrombin Time: 40.1 seconds — ABNORMAL HIGH (ref 11.6–15.2)

## 2015-04-08 LAB — URINALYSIS, ROUTINE W REFLEX MICROSCOPIC
BILIRUBIN URINE: NEGATIVE
Glucose, UA: NEGATIVE mg/dL
Hgb urine dipstick: NEGATIVE
Ketones, ur: NEGATIVE mg/dL
Leukocytes, UA: NEGATIVE
NITRITE: NEGATIVE
PROTEIN: NEGATIVE mg/dL
Specific Gravity, Urine: 1.018 (ref 1.005–1.030)
UROBILINOGEN UA: 0.2 mg/dL (ref 0.0–1.0)
pH: 5.5 (ref 5.0–8.0)

## 2015-04-08 LAB — BRAIN NATRIURETIC PEPTIDE: B NATRIURETIC PEPTIDE 5: 187.8 pg/mL — AB (ref 0.0–100.0)

## 2015-04-08 LAB — POC OCCULT BLOOD, ED: Fecal Occult Bld: POSITIVE — AB

## 2015-04-08 LAB — I-STAT TROPONIN, ED: Troponin i, poc: 0 ng/mL (ref 0.00–0.08)

## 2015-04-08 LAB — I-STAT CG4 LACTIC ACID, ED: LACTIC ACID, VENOUS: 0.58 mmol/L (ref 0.5–2.0)

## 2015-04-08 MED ORDER — SODIUM CHLORIDE 0.9 % IV BOLUS (SEPSIS)
1000.0000 mL | Freq: Once | INTRAVENOUS | Status: AC
  Administered 2015-04-08: 1000 mL via INTRAVENOUS

## 2015-04-08 MED ORDER — PANTOPRAZOLE SODIUM 40 MG IV SOLR
40.0000 mg | Freq: Two times a day (BID) | INTRAVENOUS | Status: DC
  Administered 2015-04-12 – 2015-04-13 (×3): 40 mg via INTRAVENOUS
  Filled 2015-04-08 (×4): qty 40

## 2015-04-08 MED ORDER — SODIUM CHLORIDE 0.9 % IV SOLN
8.0000 mg/h | INTRAVENOUS | Status: AC
  Administered 2015-04-09 – 2015-04-11 (×6): 8 mg/h via INTRAVENOUS
  Filled 2015-04-08 (×14): qty 80

## 2015-04-08 MED ORDER — VITAMIN K1 10 MG/ML IJ SOLN
10.0000 mg | Freq: Once | INTRAVENOUS | Status: AC
  Administered 2015-04-08: 10 mg via INTRAVENOUS
  Filled 2015-04-08: qty 1

## 2015-04-08 MED ORDER — SODIUM CHLORIDE 0.9 % IV SOLN
10.0000 mL/h | Freq: Once | INTRAVENOUS | Status: AC
  Administered 2015-04-08: 10 mL/h via INTRAVENOUS

## 2015-04-08 MED ORDER — SODIUM CHLORIDE 0.9 % IV SOLN
80.0000 mg | Freq: Once | INTRAVENOUS | Status: AC
  Administered 2015-04-08: 80 mg via INTRAVENOUS
  Filled 2015-04-08: qty 80

## 2015-04-08 NOTE — ED Provider Notes (Signed)
CSN: [id number]     Arrival date & time 04/08/15  1832 History   First MD Initiated Contact with Patient 04/08/15 1936     Chief Complaint  Patient presents with  . Shortness of Breath    Patient is a 69 y.o. male presenting with shortness of breath. The history is provided by the patient.  Shortness of Breath Severity:  Moderate Onset quality:  Gradual Duration: "awhle ago" Timing:  Intermittent Progression:  Worsening Chronicity:  Recurrent Relieved by:  Nothing Worsened by:  Nothing tried Associated symptoms: cough   Associated symptoms: no chest pain   Patient reports h/o recurrent pneumonia and reports cough and SOB "ever since my lung surgery" He reports feeling increased SOB over past several days He also reports dark stool and he is on coumadin He stopped coumadin today He also reports dysuria   Past Medical History  Diagnosis Date  . Venous thrombosis     Recurrent  . Peripheral vascular disease   . Dyslipidemia   . AAA (abdominal aortic aneurysm)   . COPD (chronic obstructive pulmonary disease)   . Gout   . DJD (degenerative joint disease)   . Malignant hypertension   . Shortness of breath   . Pneumonia 08/2013    pt. reports that he is having this surgery 11/25/2014- for the lung problem that began with pneumonia in 09/2014  . 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     Past Surgical History  Procedure Laterality Date  . Back surgery    . Hernia repair    . Appendectomy    . Shoulder surgery Right   . Eye surgery      both eyes, cataracts removed, denies lens implants  . Video bronchoscopy N/A 11/25/2014    Procedure: VIDEO BRONCHOSCOPY;  Surgeon: Delight Ovens, MD;  Location: [facility] OR;  Service: Thoracic;  Laterality: N/A;  . Video assisted thoracoscopy Right 11/25/2014    Procedure: VIDEO ASSISTED THORACOSCOPY;  Surgeon: Delight Ovens, MD;  Location: [facility] OR;  Service: Thoracic;   Laterality: Right;  . Empyema drainage N/A 11/25/2014    Procedure: EMPYEMA DRAINAGE;  Surgeon: Delight Ovens, MD;  Location: [facility] OR;  Service: Thoracic;  Laterality: N/A;   No family history on file. History  Substance Use Topics  . Smoking status: Former Smoker    Quit date: 10/18/1992  . Smokeless tobacco: Never Used  . Alcohol Use: No    Review of Systems  Constitutional: Positive for fatigue.  Respiratory: Positive for cough and shortness of breath.   Cardiovascular: Negative for chest pain.  Gastrointestinal: Positive for blood in stool.  Neurological: Positive for weakness.  All other systems reviewed and are negative.     Allergies  Codeine and Amlodipine  Home Medications   Prior to Admission medications   Medication Sig Start Date End Date Taking? Authorizing Provider  cilostazol (PLETAL) 100 MG tablet TAKE ONE TABLET BY MOUTH TWICE DAILY 02/18/15   Mihai Croitoru, MD  fenofibrate 160 MG tablet TAKE 1 TABLET BY MOUTH DAILY 12/12/14   Mihai Croitoru, MD  HYDROcodone-acetaminophen (NORCO) 10-325 MG per tablet Take 1 tablet by mouth every 4 (four) hours as needed for severe pain. 11/30/14   Donielle Margaretann Loveless, PA-C  ibuprofen (ADVIL,MOTRIN) 800 MG tablet Take 800 mg by mouth every 8 (eight) hours as needed for moderate pain.    Historical Provider, MD  nebivolol (BYSTOLIC)  5 MG tablet Take 1 tablet (5 mg total) by mouth daily. 11/30/14   Donielle Margaretann Loveless, PA-C  PROAIR HFA 108 (90 BASE) MCG/ACT inhaler Inhale 3-4 puffs into the lungs every 4 (four) hours as needed for wheezing or shortness of breath.  10/31/14   Historical Provider, MD  simvastatin (ZOCOR) 40 MG tablet Take 1 tablet (40 mg total) by mouth daily at 6 PM. 12/06/14   Mihai Croitoru, MD  warfarin (COUMADIN) 2.5 MG tablet TAKE 1 TABLET BY MOUTH DAILY OR AS DIRECTED 03/05/15   Ladene Artist, MD   BP 106/44 mmHg  Pulse 93  Temp(Src) 98.2 F (36.8 C) (Oral)  Resp 21  SpO2 100% Physical  Exam CONSTITUTIONAL: ill appearing HEAD: Normocephalic/atraumatic EYES: EOMI ENMT: Mucous membranes moist NECK: supple no meningeal signs SPINE/BACK:entire spine nontender CV: S1/S2 noted, no murmurs/rubs/gallops noted LUNGS:coarse BS noted bilaterally ABDOMEN: soft, nontender, no rebound or guarding, bowel sounds noted throughout abdomen Rectal - stool color black, hemoccult positive chaperone present for exam NEURO: Pt is awake/alert/appropriate, moves all extremitiesx4.  No facial droop.   EXTREMITIES: pulses normal/equal, full ROM, no significant LE edema SKIN: warm, color normal PSYCH: no abnormalities of mood noted, alert and oriented to situation  ED Course  Procedures   CRITICAL CARE Performed by: Joya Gaskins Total critical care time: 40 Critical care time was exclusive of separately billable procedures and treating other patients. Critical care was necessary to treat or prevent imminent or life-threatening deterioration. Critical care was time spent personally by me on the following activities: development of treatment plan with patient and/or surrogate as well as nursing, discussions with consultants, evaluation of patient's response to treatment, examination of patient, obtaining history from patient or surrogate, ordering and performing treatments and interventions, ordering and review of laboratory studies, ordering and review of radiographic studies, pulse oximetry and re-evaluation of patient's condition. PATIENT WITH GI BLEED, HE IS ANEMIC, HE IS RECEIVING BLOOD PRODUCTS Labs Review Labs Reviewed  CBC - Abnormal; Notable for the following:    WBC 14.7 (*)    RBC 3.35 (*)    Hemoglobin 8.5 (*)    HCT 26.0 (*)    MCV 77.6 (*)    MCH 25.4 (*)    RDW 19.0 (*)    All other components within normal limits  BASIC METABOLIC PANEL - Abnormal; Notable for the following:    CO2 13 (*)    Glucose, Bld 148 (*)    BUN 114 (*)    Creatinine, Ser 3.42 (*)    Calcium 8.7  (*)    GFR calc non Af Amer 17 (*)    GFR calc Af Amer 20 (*)    Anion gap 16 (*)    All other components within normal limits  PROTIME-INR - Abnormal; Notable for the following:    Prothrombin Time 40.1 (*)    INR 4.29 (*)    All other components within normal limits  BRAIN NATRIURETIC PEPTIDE - Abnormal; Notable for the following:    B Natriuretic Peptide 187.8 (*)    All other components within normal limits  POC OCCULT BLOOD, ED - Abnormal; Notable for the following:    Fecal Occult Bld POSITIVE (*)    All other components within normal limits  URINALYSIS, ROUTINE W REFLEX MICROSCOPIC (NOT AT [facility])  I-STAT TROPOININ, ED  I-STAT CG4 LACTIC ACID, ED  TYPE AND SCREEN  PREPARE RBC (CROSSMATCH)    Imaging Review Dg Chest Port 1 View  04/08/2015  CLINICAL DATA:  Cough, weakness, shortness of breath, diagnosed with pneumonia 2 weeks ago  EXAM: PORTABLE CHEST - 1 VIEW  COMPARISON:  CT chest dated 03/25/2015  FINDINGS: Mild patchy opacity at the left lung base is favored to reflect subpleural scarring/ atelectasis, less likely pneumonia. Suspected chronic pleural thickening without definite pleural effusion (when correlating with CT). No pneumothorax.  The heart is normal in size.  IMPRESSION: Mild patchy opacity at the left lung base, favored to reflect subpleural scarring/atelectasis, less likely pneumonia.   Electronically Signed   By: Charline Bills M.D.   On: 04/08/2015 19:56     EKG Interpretation   Date/Time:  Tuesday April 08 2015 18:49:05 EDT Ventricular Rate:  93 PR Interval:  171 QRS Duration: 101 QT Interval:  356 QTC Calculation: 443 R Axis:   -73 Text Interpretation:  Sinus rhythm Left anterior fascicular block Abnormal  R-wave progression, late transition No significant change since last  tracing Confirmed by Bebe Shaggy  MD, Abuk Selleck ([id number]) on 04/08/2015 6:51:34 PM       9:29 PM Pt with right sided VATS procedure previously and he reports he has coughed ever  since He assumed he had pneumonia, but no acute change in CXR His main issue is that he anemic with GI bleed and on coumadin Vitamin K ordered PRBC ordered Will need admission He had mild hypotension, responded to IV fluids GI consulted 9:50 PM D/w dr Madilyn Fireman He is aware of patient Will admit to [facility] 10:49 PM Pt is now receiving blood products SBP has ranged from 90-100 Will admit to stepdown D/w dr Clyde Lundborg will admit to stepdown HE WILL START PROTONIX BP 102/51 mmHg  Pulse 84  Temp(Src) 98.1 F (36.7 C) (Oral)  Resp 16  SpO2 100%   MDM   Final diagnoses:  AKI (acute kidney injury)  Acute GI bleeding  Anemia due to blood loss, acute    Nursing notes including past medical history and social history reviewed and considered in documentation Labs/vital reviewed myself and considered during evaluation xrays/imaging reviewed by myself and considered during evaluation     Zadie Rhine, MD 04/08/15 2250

## 2015-04-08 NOTE — H&P (Signed)
[facility] History and Physical  Tony Zhang:[id number] DOB: 01-24-1946 DOA: 04/08/2015  Referring physician: ED physician PCP: [name], MD  Specialists:   Chief Complaint: Dark stool, shortness breath and weakness  HPI: Tony Zhang is a 69 y.o. male with PMH of hypertension, hyperlipidemia, gout, recurrent DVT, remote history of PE 1980, PVD, AAA, COPD, DJD, who presents with a dark stool, shortness of breath and weakness.  Patient reports that he is taking Coumadin because of remote history of PE 1980, and recurrent left leg DVT. Since this morning, he has had 3-4 times of bowel movement with dark stool. He feels lightheaded and dizzy. He felt that he has almost passed out, but did not. He has mild abdominal soreness. He said he stopped taking Coumadin after noticed dark stool today. Of note, Patient reports that he was hospitalized from 2/8-2/13/16 because of empyema secondary to previous pneumonia on 09/2014. He had video bronchoscopy, right video-assisted thoracoscopy/mini-thoracotomy, drainage of empyema and decortication in that admission. He states that his health condition has never come back to his baseline. He still has generalized weakness, mild SOB and chronic cough with white-yellow colored sputum production, but no chest pain. He also reports having burning on urination and increased urinary frequency, but no dysuria. Patient does not have fever, chills, rashes, unilateral weakness. He reports that he had DVT twice in left leg. Last one was 15 years ago. Currently no tenderness over calf areas. He reports that he had a negative colonoscopy 20 years ago, never had EGD before.  In ED, patient was found to have soft blood pressure, INR=4.29, WBC 14.7, hemoglobin dropped from 10.0 on 11/27/14 to 8.5, negative troponin, BNP 187.8, negative urinalysis,  temperature normal, AKI and lactate 0.58. Chest x-ray showed mild patchy opacity at the left lung base. Patient is admitted  to inpatient for further evaluation and treatment. GI was consulted by ED.  Where does patient live?   At home    Can patient participate in ADLs?   Some   Review of Systems:   General: no fevers, chills, has poor appetite, has fatigue HEENT: no blurry vision, hearing changes or sore throat Pulm: has dyspnea, coughing, no wheezing CV: no chest pain, palpitations Abd: no nausea, vomiting, has abdominal pain, no diarrhea, constipation.  GU: no dysuria, has burning on urination and increased urinary frequency, no hematuria  Ext: no leg edema Neuro: no unilateral weakness, numbness, or tingling, no vision change or hearing loss Skin: no rash MSK: No muscle spasm, no deformity, no limitation of range of movement in spin Heme: No easy bruising.  Travel history: No recent long distant travel.  Allergy:  Allergies  Allergen Reactions  . Codeine Hives and Other (See Comments)    Takes benadryl to stop reaction    Past Medical History  Diagnosis Date  . Venous thrombosis     Recurrent  . Peripheral vascular disease   . Dyslipidemia   . AAA (abdominal aortic aneurysm)   . COPD (chronic obstructive pulmonary disease)   . Gout   . DJD (degenerative joint disease)   . Malignant hypertension   . Shortness of breath   . Pneumonia 08/2013    pt. reports that he is having this surgery 11/25/2014- for the lung problem that began with pneumonia in 09/2014  . 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      Past Surgical History  Procedure  Laterality Date  . Back surgery    . Hernia repair    . Appendectomy    . Shoulder surgery Right   . Eye surgery      both eyes, cataracts removed, denies lens implants  . Video bronchoscopy N/A 11/25/2014    Procedure: VIDEO BRONCHOSCOPY;  Surgeon: Delight Ovens, MD;  Location: [facility] OR;  Service: Thoracic;  Laterality: N/A;  . Video assisted thoracoscopy Right 11/25/2014    Procedure:  VIDEO ASSISTED THORACOSCOPY;  Surgeon: Delight Ovens, MD;  Location: [facility] OR;  Service: Thoracic;  Laterality: Right;  . Empyema drainage N/A 11/25/2014    Procedure: EMPYEMA DRAINAGE;  Surgeon: Delight Ovens, MD;  Location: [facility] OR;  Service: Thoracic;  Laterality: N/A;    Social History:  reports that he quit smoking about 22 years ago. He has never used smokeless tobacco. He reports that he does not drink alcohol or use illicit drugs.  Family History: No family history on file.   Prior to Admission medications   Medication Sig Start Date End Date Taking? Authorizing Provider  cilostazol (PLETAL) 100 MG tablet TAKE ONE TABLET BY MOUTH TWICE DAILY 02/18/15  Yes Mihai Croitoru, MD  fenofibrate 160 MG tablet TAKE 1 TABLET BY MOUTH DAILY 12/12/14  Yes Mihai Croitoru, MD  HYDROcodone-acetaminophen (NORCO) 10-325 MG per tablet Take 1 tablet by mouth every 4 (four) hours as needed for severe pain. 11/30/14  Yes Donielle Margaretann Loveless, PA-C  ibuprofen (ADVIL,MOTRIN) 800 MG tablet Take 800 mg by mouth every 8 (eight) hours as needed for moderate pain.   Yes Historical Provider, MD  nebivolol (BYSTOLIC) 5 MG tablet Take 1 tablet (5 mg total) by mouth daily. 11/30/14  Yes Donielle Margaretann Loveless, PA-C  PROAIR HFA 108 (90 BASE) MCG/ACT inhaler Inhale 3-4 puffs into the lungs every 4 (four) hours as needed for wheezing or shortness of breath.  10/31/14  Yes Historical Provider, MD  simvastatin (ZOCOR) 40 MG tablet Take 1 tablet (40 mg total) by mouth daily at 6 PM. 12/06/14  Yes Mihai Croitoru, MD  warfarin (COUMADIN) 2.5 MG tablet TAKE 1 TABLET BY MOUTH DAILY OR AS DIRECTED Patient taking differently: Take 2.5 mg by mouth daily.  03/05/15  Yes Ladene Artist, MD  allopurinol (ZYLOPRIM) 300 MG tablet Take 300 mg by mouth daily. 04/03/15   Historical Provider, MD  amLODipine (NORVASC) 2.5 MG tablet Take 2.5 mg by mouth daily. 04/03/15   Historical Provider, MD  benazepril (LOTENSIN) 40 MG tablet Take 40 mg by mouth  daily. 04/03/15   Historical Provider, MD    Physical Exam: Filed Vitals:   04/09/15 0018 04/09/15 0109 04/09/15 0110 04/09/15 0125  BP: 109/45   96/35  Pulse: 90     Temp:  98.3 F (36.8 C) 98.7 F (37.1 C) 98.1 F (36.7 C)  TempSrc:  Oral  Oral  Resp: 22     Height:      Weight:      SpO2: 100%      General: Not in acute distress. Looks tired and pale. HEENT:       Eyes: PERRL, EOMI, no scleral icterus.       ENT: No discharge from the ears and nose, no pharynx injection, no tonsillar enlargement.        Neck: No JVD, no bruit, no mass felt. Heme: No neck lymph node enlargement. Cardiac: S1/S2, RRR, No murmurs, No gallops or rubs. Pulm:  No rales, wheezing, rhonchi or rubs. Abd: Soft,  nondistended, mild diffuse tenderness, no rebound pain, no organomegaly, BS present. Ext: No pitting leg edema bilaterally. 2+DP/PT pulse bilaterally. Musculoskeletal: No joint deformities, No joint redness or warmth, no limitation of ROM in spin. Skin: No rashes.  Neuro: Alert, oriented X3, cranial nerves II-XII grossly intact, muscle strength 5/5 in all extremities, sensation to light touch intact. Psych: Patient is not psychotic, no suicidal or hemocidal ideation.  Labs on Admission:  Basic Metabolic Panel:  Recent Labs Lab 04/08/15 1858  NA 136  K 3.8  CL 107  CO2 13*  GLUCOSE 148*  BUN 114*  CREATININE 3.42*  CALCIUM 8.7*   Liver Function Tests: No results for input(s): AST, ALT, ALKPHOS, BILITOT, PROT, ALBUMIN in the last 168 hours. No results for input(s): LIPASE, AMYLASE in the last 168 hours. No results for input(s): AMMONIA in the last 168 hours. CBC:  Recent Labs Lab 04/08/15 1858  WBC 14.7*  HGB 8.5*  HCT 26.0*  MCV 77.6*  PLT 353   Cardiac Enzymes: No results for input(s): CKTOTAL, CKMB, CKMBINDEX, TROPONINI in the last 168 hours.  BNP (last 3 results)  Recent Labs  10/10/14 0330 04/08/15 2042  BNP 426.9* 187.8*    ProBNP (last 3 results) No  results for input(s): PROBNP in the last 8760 hours.  CBG: No results for input(s): GLUCAP in the last 168 hours.  Radiological Exams on Admission: Dg Chest Port 1 View  04/08/2015   CLINICAL DATA:  Cough, weakness, shortness of breath, diagnosed with pneumonia 2 weeks ago  EXAM: PORTABLE CHEST - 1 VIEW  COMPARISON:  CT chest dated 03/25/2015  FINDINGS: Mild patchy opacity at the left lung base is favored to reflect subpleural scarring/ atelectasis, less likely pneumonia. Suspected chronic pleural thickening without definite pleural effusion (when correlating with CT). No pneumothorax.  The heart is normal in size.  IMPRESSION: Mild patchy opacity at the left lung base, favored to reflect subpleural scarring/atelectasis, less likely pneumonia.   Electronically Signed   By: Charline Bills M.D.   On: 04/08/2015 19:56    EKG: Independently reviewed.  Abnormal findings: Low voltage, poor R-wave progression   Assessment/Plan Principal Problem:   GI bleed Active Problems:   AAA (abdominal aortic aneurysm)   DVT, lower extremity and pulmonary embolism, recurrent   Hyperlipidemia   Gout   COPD (chronic obstructive pulmonary disease)   OSA (obstructive sleep apnea)   Bilateral renal artery stenosis   Microcytic anemia   Peripheral vascular disease   Supratherapeutic INR   GIB (gastrointestinal bleeding)   AKI (acute kidney injury)  GI bleed: This is most likely secondary to supratherapeutic INR secondary to Coumadin use. Patient is taking ibuprofen, which may have also contributed. Not sure whether patient has peptic ulcer disease. Hgb 10.0-->8.5. Bp is soft. Mental status is normal. Lactic acid 0.58. - will admit to SDU - hod coumadine - 2 U of blood were ordered by ED - IV 10 mg of Vk was given by ED - GI consulted by Ed, will follow up recommendations - NPO  - NS at 100 mL/hr - Start IV pantoprazole gtt - hydroxyzine when necessary for nausea - Avoid NSAIDs and SQ heparin -  Maintain IV access (2 large bore IVs if possible). - Monitor closely and follow q6h cbc, transfuse as necessary. - LaB: INR, PTT  DVT, lower extremity and pulmonary embolism, recurrent: Last episode of left leg DVT was 15 years ago. Currently no signs of DVT. Patient is taking Coumadin, with INR 4.29,  presents with GI bleeding. -hold coumadin now  Gout: stable. -hold oral med including allopurinol  COPD (chronic obstructive pulmonary disease): Patient reports that he still has mild breath and productive cough ever since he was treated for empyema on 11/2014. His lungs are clear on auscultation, no signs of COPD exacerbation. Productive cough is unclear in etiology. CXR showed mild pachy opacity over LL base, but clinically patient does not seem to have pneumonia (no fever, chest pain or worsening shortness of breath).  -will get sputum culture -may need Chest CT, but since he has AKI, will hydrate patient first before get CT-chest with contrast. -Albuterol nebulizer when necessary -Mucinex for cough  Microcytic anemia: Complicated by GI bleeding. -check anemia panel -transfuse as above.  AKI and bilateral renal artery stenosis: Creatinine was 1.01 on 11/27/14, up to 3.42, BUN 114 day. Patient has bilateral renal artery stenosis, is fragile for low renal perfusion. His AKI is most likely secondary to acute blood loss and low blood pressure -transfuse as above -IVF: NS 1L and 100 cc/h of NS -check FeNA and us-renal  Peripheral vascular disease: -hold oral cilostazole due to GIB  HTN: now bp is soft. -hold home pressure medications: Amlodipine, Lotensin, bystolic  Hypercholesterolemia and Hypertriglyceridemia: TG was 298 and LDL 75 on 09/17/13. -Hold oral Zocor and fenofibrate due to GI bleeding.   DVT ppx: INR= 4.29, No heparin due to GIB. Since patient had hx of DVT, will not start SCD now. Will need to restart DVT PPx as soon as OK per GI  Code Status:  Partial code (No CPR, but OK  with intubation) Family Communication: None at bed side.       Disposition Plan: Admit to inpatient   Date of Service 04/09/2015    Lorretta Harp [facility] Pager [phone]  If 7PM-7AM, please contact night-coverage [email or web address] Password [email or web address] 04/09/2015, 2:09 AM

## 2015-04-08 NOTE — ED Notes (Signed)
Pt. Presents with complaint of SOB. Pt. Had R sided lung surgery 3 months ago for pna. Seen at PCP last week and found to have L sided PNA. Pt. Endorses dark stool and burning/frequency of urination x 2 weeks.

## 2015-04-08 NOTE — ED Notes (Signed)
RN called blood bank for follow up on type and screen; lab tech reports est 15-20 more mins for results

## 2015-04-08 NOTE — ED Notes (Signed)
Tech to blood bank for blood products

## 2015-04-09 ENCOUNTER — Other Ambulatory Visit: Payer: Medicare Other

## 2015-04-09 ENCOUNTER — Inpatient Hospital Stay (HOSPITAL_COMMUNITY): Payer: Medicare Other

## 2015-04-09 ENCOUNTER — Ambulatory Visit: Payer: Medicare Other

## 2015-04-09 DIAGNOSIS — I701 Atherosclerosis of renal artery: Secondary | ICD-10-CM

## 2015-04-09 DIAGNOSIS — N179 Acute kidney failure, unspecified: Secondary | ICD-10-CM

## 2015-04-09 DIAGNOSIS — R791 Abnormal coagulation profile: Secondary | ICD-10-CM

## 2015-04-09 DIAGNOSIS — D62 Acute posthemorrhagic anemia: Secondary | ICD-10-CM | POA: Insufficient documentation

## 2015-04-09 LAB — CBC
HCT: 27.1 % — ABNORMAL LOW (ref 39.0–52.0)
HEMATOCRIT: 27.5 % — AB (ref 39.0–52.0)
HEMATOCRIT: 26.7 % — AB (ref 39.0–52.0)
Hemoglobin: 9.2 g/dL — ABNORMAL LOW (ref 13.0–17.0)
Hemoglobin: 9 g/dL — ABNORMAL LOW (ref 13.0–17.0)
Hemoglobin: 9.1 g/dL — ABNORMAL LOW (ref 13.0–17.0)
MCH: 26.9 pg (ref 26.0–34.0)
MCH: 26.9 pg (ref 26.0–34.0)
MCH: 26.9 pg (ref 26.0–34.0)
MCHC: 33.5 g/dL (ref 30.0–36.0)
MCHC: 33.7 g/dL (ref 30.0–36.0)
MCHC: 33.6 g/dL (ref 30.0–36.0)
MCV: 80.4 fL (ref 78.0–100.0)
MCV: 79.7 fL (ref 78.0–100.0)
MCV: 80.2 fL (ref 78.0–100.0)
PLATELETS: 294 10*3/uL (ref 150–400)
PLATELETS: 284 10*3/uL (ref 150–400)
Platelets: 318 10*3/uL (ref 150–400)
RBC: 3.42 MIL/uL — ABNORMAL LOW (ref 4.22–5.81)
RBC: 3.35 MIL/uL — ABNORMAL LOW (ref 4.22–5.81)
RBC: 3.38 MIL/uL — ABNORMAL LOW (ref 4.22–5.81)
RDW: 18.8 % — AB (ref 11.5–15.5)
RDW: 18.8 % — ABNORMAL HIGH (ref 11.5–15.5)
RDW: 18.7 % — AB (ref 11.5–15.5)
WBC: 10.5 10*3/uL (ref 4.0–10.5)
WBC: 9.8 10*3/uL (ref 4.0–10.5)
WBC: 9.6 10*3/uL (ref 4.0–10.5)

## 2015-04-09 LAB — COMPREHENSIVE METABOLIC PANEL
ALBUMIN: 2.9 g/dL — AB (ref 3.5–5.0)
ALT: 9 U/L — ABNORMAL LOW (ref 17–63)
AST: 12 U/L — ABNORMAL LOW (ref 15–41)
Alkaline Phosphatase: 44 U/L (ref 38–126)
Anion gap: 10 (ref 5–15)
BUN: 95 mg/dL — ABNORMAL HIGH (ref 6–20)
CO2: 14 mmol/L — ABNORMAL LOW (ref 22–32)
CREATININE: 2.92 mg/dL — AB (ref 0.61–1.24)
Calcium: 8.6 mg/dL — ABNORMAL LOW (ref 8.9–10.3)
Chloride: 114 mmol/L — ABNORMAL HIGH (ref 101–111)
GFR calc Af Amer: 24 mL/min — ABNORMAL LOW (ref >60)
GFR calc non Af Amer: 20 mL/min — ABNORMAL LOW (ref >60)
Glucose, Bld: 97 mg/dL (ref 65–99)
Potassium: 3.9 mmol/L (ref 3.5–5.1)
SODIUM: 138 mmol/L (ref 135–145)
Total Bilirubin: 1.1 mg/dL (ref 0.3–1.2)
Total Protein: 6.4 g/dL — ABNORMAL LOW (ref 6.5–8.1)

## 2015-04-09 LAB — SODIUM, URINE, RANDOM: SODIUM UR: 92 mmol/L

## 2015-04-09 LAB — PROTIME-INR
INR: 1.9 — ABNORMAL HIGH (ref 0.00–1.49)
PROTHROMBIN TIME: 21.7 s — AB (ref 11.6–15.2)

## 2015-04-09 LAB — MRSA PCR SCREENING: MRSA by PCR: NEGATIVE

## 2015-04-09 LAB — APTT: aPTT: 50 seconds — ABNORMAL HIGH (ref 24–37)

## 2015-04-09 LAB — CREATININE, URINE, RANDOM: Creatinine, Urine: 46.79 mg/dL

## 2015-04-09 MED ORDER — ALBUTEROL SULFATE (2.5 MG/3ML) 0.083% IN NEBU: 2.5000 mg | INHALATION_SOLUTION | RESPIRATORY_TRACT | Status: DC | PRN

## 2015-04-09 MED ORDER — SODIUM CHLORIDE 0.9 % IV SOLN: INTRAVENOUS | Status: DC

## 2015-04-09 MED ORDER — DM-GUAIFENESIN ER 30-600 MG PO TB12
1.0000 | ORAL_TABLET | Freq: Two times a day (BID) | ORAL | Status: DC
  Administered 2015-04-09 – 2015-04-14 (×12): 1 via ORAL
  Filled 2015-04-09 (×14): qty 1

## 2015-04-09 MED ORDER — MORPHINE SULFATE 2 MG/ML IJ SOLN
2.0000 mg | INTRAMUSCULAR | Status: DC | PRN
  Administered 2015-04-09 – 2015-04-14 (×25): 2 mg via INTRAVENOUS
  Filled 2015-04-09 (×25): qty 1

## 2015-04-09 MED ORDER — SODIUM CHLORIDE 0.9 % IV SOLN
INTRAVENOUS | Status: DC
  Administered 2015-04-09 – 2015-04-12 (×6): via INTRAVENOUS

## 2015-04-09 MED ORDER — SODIUM CHLORIDE 0.9 % IJ SOLN
3.0000 mL | Freq: Two times a day (BID) | INTRAMUSCULAR | Status: DC
  Administered 2015-04-09 – 2015-04-14 (×8): 3 mL via INTRAVENOUS

## 2015-04-09 MED ORDER — HYDROXYZINE HCL 50 MG/ML IM SOLN
25.0000 mg | Freq: Four times a day (QID) | INTRAMUSCULAR | Status: DC | PRN
  Administered 2015-04-12: 25 mg via INTRAMUSCULAR
  Filled 2015-04-09 (×3): qty 0.5

## 2015-04-09 MED ORDER — HYDRALAZINE HCL 20 MG/ML IJ SOLN
5.0000 mg | INTRAMUSCULAR | Status: DC | PRN
  Administered 2015-04-12: 5 mg via INTRAVENOUS
  Filled 2015-04-09 (×2): qty 1

## 2015-04-09 NOTE — Progress Notes (Signed)
UR COMPLETED  

## 2015-04-09 NOTE — Progress Notes (Addendum)
PROGRESS NOTE  Tony Zhang UEA:[id number] DOB: January 16, 1946 DOA: 04/08/2015 PCP: [name], MD  Assessment/Plan: Acute blood loss anemia -04/08/2015-transfuse 2 units PRBC -Drop in hemoglobin partly secondary to dilution -monitor serial H/H Melena -pt had supratherapeutic INR in setting of chronic ibuprofen use -Patient was taking ibuprofen 800 mg 3 times a day for the past 20 years  -Continue Protonix drip  -Appreciate GI consult  -Plans to defer EGD on 04/10/2015  -Hold Coumadin until cleared by GI to restart -Patient received vitamin K in the emergency department -hold Pletal -continue protonix gtt 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) -multifactorial in the setting of NSAID and ACEi use and renal artery stenosis and hemodynamic changes due to ABLA -Renal ultrasound negative for hydronephrosis or bladder distention  -Discontinue ACE inhibitor and NSAIDs  -Continue IV fluids  -am BMP Recurrent DVT  -Patient's history is a little sketchy, but states that his last DVT was 15 years ago  -Patient states that he has had  3 DVTs, but not completely clear if he was on or off anticoagulation when he had his third DVT -hold coumadin until cleared by GI to restart Coagulopathy  -Patient had supratherapeutic INR at the time of admission--4.29 -Received vitamin K 10 g IV 1  COPD  -Stable on room air  -Patient had empyema with decortication February 2016 -Chest x-ray shows left basilar atelectasis/scarring HTN -BPs soft -hold bystolic and amlodipine Gouty arthritis  -Stable  -Continue allopurinol  Hyperlipidemia  -Hold the Zocor and fenofibrate until the patient is stable and able to take po    Family Communication:   Pt at beside Disposition Plan:   Home when medically stable       Procedures/Studies: Ct Chest W Contrast  03/25/2015   CLINICAL DATA:  History of pneumonia with loculated right pleural effusion, followup, also history of enlarged right hilar  and paratracheal nodes, former smoking history, productive cough  EXAM: CT CHEST WITH CONTRAST  TECHNIQUE: Multidetector CT imaging of the chest was performed during intravenous contrast administration.  CONTRAST:  1 OMNIPAQUE IOHEXOL 300 MG/ML  SOLN  BUN and creatinine were obtained on site at [facility] at [address]Results: BUN 23 mg/dL, Creatinine 1.7 mg/dL.  COMPARISON:  CT chest of 11/18/2014  FINDINGS: As noted previously there is diffuse primarily centrilobular emphysema. The 7 mm nodule described previously in the right middle lobe has diminished in size and is located adjacent to the fissure, most likely a small perifissural noted which was inflammatory in nature. The 7 mm nodule just lateral to that prior nodule is no longer seen. No new or enlarging lung nodule is noted.  The previously noted loculated right pleural effusion has almost resolved with only a small opacity remaining most consistent with atelectasis. There is slightly more opacity however within the lateral aspect of the left lower lobe which may represent left pleural effusion and mild parenchymal opacity possibly representing pneumonia. Followup by clinical exam and chest x-ray is recommended. The central airway is patent.  On soft tissue window images, the thyroid gland is unremarkable. Moderate atheromatous change throughout the aortic arch and descending thoracic aorta is again noted. The pretracheal node has diminished in size now measuring 7 mm in diameter. No enlarging adenopathy is noted. Coronary artery calcifications again are present. The upper abdomen is unremarkable.  IMPRESSION: 1. Decrease in size of previously loculated right pleural effusion with residual small amount of fluid and atelectasis. 2.  Slightly more opacity within the left lower may represent a patchy area of pneumonia with possibly a small adjacent effusion. 3. The previously noted lung nodules in the right middle lobe have either resolved or  diminished in size. 4. Diffuse centrilobular emphysema.   Electronically Signed   By: Dwyane Dee M.D.   On: 03/25/2015 12:26   US Renal  04/09/2015   CLINICAL DATA:  Acute renal insufficiency.  EXAM: RENAL / URINARY TRACT ULTRASOUND COMPLETE  COMPARISON:  09/10/2013.  FINDINGS: Right Kidney:  Length: 13.2 cm. Echogenicity within normal limits. No assault mass or hydronephrosis visualized. 1.5 cm lower pole simple cyst.  Left Kidney:  Length: 11.9 cm. Echogenicity within normal limits. No solid mass or hydronephrosis visualized. 2.4 cm parapelvic cyst.  Bladder:  Appears normal for degree of bladder distention.  IMPRESSION: 1.  No acute abnormality.  No hydronephrosis or bladder distention.  2.  Simple cyst right lower pole.  Parapelvic cyst left kidney.   Electronically Signed   By: Maisie Fus  Register   On: 04/09/2015 07:41   Dg Chest Port 1 View  04/08/2015   CLINICAL DATA:  Cough, weakness, shortness of breath, diagnosed with pneumonia 2 weeks ago  EXAM: PORTABLE CHEST - 1 VIEW  COMPARISON:  CT chest dated 03/25/2015  FINDINGS: Mild patchy opacity at the left lung base is favored to reflect subpleural scarring/ atelectasis, less likely pneumonia. Suspected chronic pleural thickening without definite pleural effusion (when correlating with CT). No pneumothorax.  The heart is normal in size.  IMPRESSION: Mild patchy opacity at the left lung base, favored to reflect subpleural scarring/atelectasis, less likely pneumonia.   Electronically Signed   By: Charline Bills M.D.   On: 04/08/2015 19:56         Subjective:  patient complains of epigastric pain. Denies any fevers, chills, chest pain, shortness breath, nausea, vomiting, diarrhea, dysuria, hematuria, rashes, headache. Patient denies any hematemesis. He states that his abdominal pain is mostly isolated in the epigastric area.  Objective: Filed Vitals:   04/09/15 0341 04/09/15 0700 04/09/15 0747 04/09/15 1023  BP: 91/37  118/47   Pulse: 83  83  80  Temp: 97.8 F (36.6 C) 98 F (36.7 C)    TempSrc: Oral Oral    Resp: Height:      Weight:      SpO2: 98%  98% 99%    Intake/Output Summary (Last 24 hours) at 04/09/15 1157 Last data filed at 04/09/15 0900  Gross per 24 hour  Intake   1620 ml  Output    875 ml  Net    745 ml   Weight change:  Exam:   General:  Pt is alert, follows commands appropriately, not in acute distress  HEENT: No icterus, No thrush, No neck mass, [place]/AT  Cardiovascular: RRR, S1/S2, no rubs, no gallops  Respiratory: left basilar crackles ; R-CTA  Abdomen: Soft/+BS, non tender, non distended, no guarding  Extremities: No edema, No lymphangitis, No petechiae, No rashes, no synovitis; no cyanosis or clubbing   Data Reviewed: Basic Metabolic Panel:  Recent Labs Lab 04/08/15 1858 04/09/15 0738  NA 136 138  K 3.8 3.9  CL 107 114*  CO2 13* 14*  GLUCOSE 148* 97  BUN 114* 95*  CREATININE 3.42* 2.92*  CALCIUM 8.7* 8.6*   Liver Function Tests:  Recent Labs Lab 04/09/15 0738  AST 12*  ALT 9*  ALKPHOS 44  BILITOT 1.1  PROT 6.4*  ALBUMIN 2.9*  No results for input(s): LIPASE, AMYLASE in the last 168 hours. No results for input(s): AMMONIA in the last 168 hours. CBC:  Recent Labs Lab 04/08/15 1858 04/09/15 0738  WBC 14.7* 10.5  HGB 8.5* 9.2*  HCT 26.0* 27.5*  MCV 77.6* 80.4  PLT 353 294   Cardiac Enzymes: No results for input(s): CKTOTAL, CKMB, CKMBINDEX, TROPONINI in the last 168 hours. BNP: Invalid input(s): POCBNP CBG: No results for input(s): GLUCAP in the last 168 hours.  Recent Results (from the past 240 hour(s))  MRSA PCR Screening     Status: None   Collection Time: 04/09/15 12:15 AM  Result Value Ref Range Status   MRSA by PCR NEGATIVE NEGATIVE Final    Comment:        The GeneXpert MRSA Assay (FDA approved for NASAL specimens only), is one component of a comprehensive MRSA colonization surveillance program. It is not intended to diagnose  MRSA infection nor to guide or monitor treatment for MRSA infections.      Scheduled Meds: . dextromethorphan-guaiFENesin  1 tablet Oral BID  . [START ON 04/12/2015] pantoprazole (PROTONIX) IV  40 mg Intravenous Q12H  . sodium chloride  3 mL Intravenous Q12H   Continuous Infusions: . sodium chloride 100 mL/hr at 04/09/15 0343  . pantoprozole (PROTONIX) infusion 8 mg/hr (04/09/15 1019)     Brycen Bean, DO  [facility] Pager [phone]  If 7PM-7AM, please contact night-coverage [email or web address] Password [facility] 04/09/2015, 11:57 AM   LOS: 1 day

## 2015-04-09 NOTE — Care Management Note (Signed)
Case Management Note  Patient Details  Name: Tony Zhang MRN: [id number] Date of Birth: January 27, 1946  Subjective/Objective:      Pt from home lives alone admitted with c/o  dark stool, shortness breath and weakness.         Action/Plan:    Return to home when medically stable. CM to f/u with d/c needs. Return to home when medically stable. CM to f/u with d/c needs.  Expected Discharge Date:                  Expected Discharge Plan:  Home/Self Care  In-House Referral:     Discharge planning Services  CM Consult  Post Acute Care Choice:    Choice offered to:     DME Arranged:    DME Agency:     HH Arranged:    HH Agency:     Status of Service:  In process, will continue to follow  Medicare Important Message Given:    Date Medicare IM Given:    Medicare IM give by:    Date Additional Medicare IM Given:    Additional Medicare Important Message give by:     If discussed at Long Length of Stay Meetings, dates discussed:    Additional Comments: [name] (brother n law, [phone]  Epifanio Lesches, RN 04/09/2015, 3:29 PM

## 2015-04-09 NOTE — Evaluation (Signed)
Physical Therapy Evaluation Patient Details Name: Tony Zhang MRN: [id number] DOB: 19-Feb-1946 Today's Date: 04/09/2015   History of Present Illness  Tony Zhang is a 69 y.o. male with PMH of hypertension, hyperlipidemia, gout, recurrent DVT, remote history of PE 1980, PVD, AAA, COPD, DJD, who presents with a dark stool, shortness of breath and weakness.  Clinical Impression  Pt admitted with above diagnosis. Pt currently with functional limitations due to the deficits listed below (see PT Problem List). Pt ambulated 150' with min-guard A to begin, and min A as he began to fatigue. Pt needed 4 standing rest breaks during 150' ambulation due to fatigue.  Pt will benefit from skilled PT to increase their independence and safety with mobility to allow discharge to the venue listed below.       Follow Up Recommendations Home health PT    Equipment Recommendations  Other (comment) (TBD)    Recommendations for Other Services OT consult     Precautions / Restrictions Precautions Precautions: Fall Precaution Comments: pt reports that he has had several "near falls" at home since last sx but has gotten somewhere to lie down and rest each time Restrictions Weight Bearing Restrictions: No      Mobility  Bed Mobility Overal bed mobility: Modified Independent                Transfers Overall transfer level: Needs assistance Equipment used: None Transfers: Sit to/from Stand Sit to Stand: Supervision         General transfer comment: supervision as pt took a few seconds to gain balance before ambulating, has not been up much in past 5 days, c/o stiffness with initial activity  Ambulation/Gait Ambulation/Gait assistance: Min guard;Min assist Ambulation Distance (Feet): 150 Feet Assistive device: None Gait Pattern/deviations: Step-through pattern Gait velocity: decreased with distance Gait velocity interpretation:  (between 1.8 and 2.62 ft/sec) General Gait Details: began  with min-guard A, gave min A after first 1/2 of ambulation as pt fatigued and began to be more unsteady. Pt reaches for stable surfaces during ambulation, encouraged him in use of AD though he is adamant that he wants to be able to ambulate without AD. Discussed option of SPC and how to use safely.   Stairs            Wheelchair Mobility    Modified Rankin (Stroke Patients Only)       Balance Overall balance assessment: Needs assistance Sitting-balance support: No upper extremity supported Sitting balance-Leahy Scale: Normal     Standing balance support: No upper extremity supported Standing balance-Leahy Scale: Good Standing balance comment: can accept challenges in standing when not fatigued, however, balance fair when pt fatigued                             Pertinent Vitals/Pain Pain Assessment: Faces Faces Pain Scale: Hurts little more Pain Location: back Pain Intervention(s): Monitored during session    Home Living Family/patient expects to be discharged to:: Private residence Living Arrangements: Alone   Type of Home: Mobile home Home Access: Stairs to enter Entrance Stairs-Rails: Doctor, general practice of Steps: 4 Home Layout: One level Home Equipment: None Additional Comments: pt goes up stairs sideways due to LLE weakness since back sx. Reports that he drives himself and does everything else for himself    Prior Function Level of Independence: Independent         Comments: pt has returned to independence but  has not reached functional level that he was before surgery 3 mos ago for empyema. Continues to have fatigue, pain, and weakness     Hand Dominance   Dominant Hand: Right    Extremity/Trunk Assessment   Upper Extremity Assessment: Defer to OT evaluation           Lower Extremity Assessment: RLE deficits/detail;LLE deficits/detail;Generalized weakness RLE Deficits / Details: generalized weakness noted, knee ext  4-/5, knee flex 4/5 LLE Deficits / Details: knee ext 3+/5, knee flex 3+/5, resisted activity of left LE causes back pain  Cervical / Trunk Assessment: Kyphotic  Communication   Communication: No difficulties  Cognition Arousal/Alertness: Awake/alert Behavior During Therapy: WFL for tasks assessed/performed Overall Cognitive Status: Within Functional Limits for tasks assessed                      General Comments General comments (skin integrity, edema, etc.): VSS thoughout ambulation. Pt frustrated because he feels he has not recovered his strength from surgery 3 mos ago and now has to be back    Exercises        Assessment/Plan    PT Assessment Patient needs continued PT services  PT Diagnosis Generalized weakness;Difficulty walking   PT Problem List Decreased strength;Decreased activity tolerance;Decreased balance;Decreased mobility;Decreased knowledge of use of DME;Decreased knowledge of precautions;Pain;Cardiopulmonary status limiting activity  PT Treatment Interventions DME instruction;Gait training;Stair training;Functional mobility training;Therapeutic activities;Therapeutic exercise;Balance training;Patient/family education   PT Goals (Current goals can be found in the Care Plan section) Acute Rehab PT Goals Patient Stated Goal: return home, be independent PT Goal Formulation: With patient Time For Goal Achievement: 04/23/15 Potential to Achieve Goals: Good    Frequency Min 3X/week   Barriers to discharge Decreased caregiver support he reports that no one helps him with anything    Co-evaluation               End of Session Equipment Utilized During Treatment: Gait belt Activity Tolerance: Patient tolerated treatment well Patient left: in bed;with call bell/phone within reach;with nursing/sitter in room Nurse Communication: Mobility status         Time: 1533-1600 PT Time Calculation (min) (ACUTE ONLY): 27 min   Charges:   PT  Evaluation $Initial PT Evaluation Tier I: 1 Procedure PT Treatments $Gait Training: 8-22 mins   PT G Codes:      Lyanne Co, PT  Acute Rehab Services  [phone]   [place], [place] 04/09/2015, 4:14 PM

## 2015-04-09 NOTE — Consult Note (Signed)
Referring Provider: Dr. Tat Primary Care Physician:  LITTLE,[name], MD Primary Gastroenterologist:  Unassigned  Reason for Consultation:  Melena  HPI: Tony Zhang is a 69 y.o. male who has had 3 episodes of black stools in the past 4 days without associated abdominal pain. +N without vomiting. Felt lightheaded last night. Denies hematemesis, chest pain, or shortness of breath. On Coumadin for hx of PE/DVT and INR 4.29 on admit. Hgb 8.5. Has been using Ibuprofen TID prn and used it yesterday and earlier this week. No history of peptic ulcer disease and has never had an endoscopy. Reports having a colonoscopy 20 years ago but results unknown.  Past Medical History  Diagnosis Date  . Venous thrombosis     Recurrent  . Peripheral vascular disease   . Dyslipidemia   . AAA (abdominal aortic aneurysm)   . COPD (chronic obstructive pulmonary disease)   . Gout   . DJD (degenerative joint disease)   . Malignant hypertension   . Shortness of breath   . Pneumonia 08/2013    pt. reports that he is having this surgery 11/25/2014- for the lung problem that began with pneumonia in 09/2014  . Blood dyscrasia     followed by Dr. Sherrill, for clotting issue, has been on Coumadin for about 20 yrs.      Past Surgical History  Procedure Laterality Date  . Back surgery    . Hernia repair    . Appendectomy    . Shoulder surgery Right   . Eye surgery      both eyes, cataracts removed, denies lens implants  . Video bronchoscopy N/A 11/25/2014    Procedure: VIDEO BRONCHOSCOPY;  Surgeon: Edward B Gerhardt, MD;  Location: [facility] OR;  Service: Thoracic;  Laterality: N/A;  . Video assisted thoracoscopy Right 11/25/2014    Procedure: VIDEO ASSISTED THORACOSCOPY;  Surgeon: Edward B Gerhardt, MD;  Location: [facility] OR;  Service: Thoracic;  Laterality: Right;  . Empyema drainage N/A 11/25/2014    Procedure: EMPYEMA DRAINAGE;  Surgeon: Edward B Gerhardt, MD;  Location: [facility] OR;  Service: Thoracic;  Laterality: N/A;    Prior  to Admission medications   Medication Sig Start Date End Date Taking? Authorizing Provider  allopurinol (ZYLOPRIM) 300 MG tablet Take 300 mg by mouth daily. 04/03/15  Yes Historical Provider, MD  amLODipine (NORVASC) 2.5 MG tablet Take 2.5 mg by mouth daily. 04/03/15  Yes Historical Provider, MD  benazepril (LOTENSIN) 40 MG tablet Take 40 mg by mouth daily. 04/03/15  Yes Historical Provider, MD  cilostazol (PLETAL) 100 MG tablet TAKE ONE TABLET BY MOUTH TWICE DAILY 02/18/15  Yes Mihai Croitoru, MD  fenofibrate 160 MG tablet TAKE 1 TABLET BY MOUTH DAILY 12/12/14  Yes Mihai Croitoru, MD  HYDROcodone-acetaminophen (NORCO) 10-325 MG per tablet Take 1 tablet by mouth every 4 (four) hours as needed for severe pain. 11/30/14  Yes Donielle M Zimmerman, PA-C  ibuprofen (ADVIL,MOTRIN) 800 MG tablet Take 800 mg by mouth every 8 (eight) hours as needed for moderate pain.   Yes Historical Provider, MD  nebivolol (BYSTOLIC) 5 MG tablet Take 1 tablet (5 mg total) by mouth daily. 11/30/14  Yes Donielle M Zimmerman, PA-C  PROAIR HFA 108 (90 BASE) MCG/ACT inhaler Inhale 3-4 puffs into the lungs every 4 (four) hours as needed for wheezing or shortness of breath.  10/31/14  Yes Historical Provider, MD  simvastatin (ZOCOR) 40 MG tablet Take 1 tablet (40 mg total) by mouth daily at 6 PM. 12/06/14  Yes Mihai   Croitoru, MD  warfarin (COUMADIN) 2.5 MG tablet TAKE 1 TABLET BY MOUTH DAILY OR AS DIRECTED Patient taking differently: Take 2.5 mg by mouth daily.  03/05/15  Yes Gary B Sherrill, MD    Scheduled Meds: . dextromethorphan-guaiFENesin  1 tablet Oral BID  . [START ON 04/12/2015] pantoprazole (PROTONIX) IV  40 mg Intravenous Q12H  . sodium chloride  3 mL Intravenous Q12H   Continuous Infusions: . sodium chloride 100 mL/hr at 04/09/15 0343  . pantoprozole (PROTONIX) infusion 8 mg/hr (04/09/15 0600)   PRN Meds:.albuterol, hydrALAZINE, hydrOXYzine, morphine injection  Allergies as of 04/08/2015 - Review Complete 04/08/2015   Allergen Reaction Noted  . Codeine Hives and Other (See Comments) 09/29/2011    No family history on file.  History   Social History  . Marital Status: Divorced    Spouse Name: N/A  . Number of Children: N/A  . Years of Education: N/A   Occupational History  . Not on file.   Social History Main Topics  . Smoking status: Former Smoker    Quit date: 10/18/1992  . Smokeless tobacco: Never Used  . Alcohol Use: No  . Drug Use: No  . Sexual Activity: Not on file   Other Topics Concern  . Not on file   Social History Narrative    Review of Systems: All negative except as stated above in HPI.  Physical Exam: Vital signs: Filed Vitals:   04/09/15 0341  BP: 91/37  Pulse: 83  Temp: 97.8 F (36.6 C)  Resp: 17   Last BM Date: 04/08/15 General:   Disheveled, lethargic, no acute distress Head: atraumatic Eyes: pupils equal and reactive, anicteric sclera ENT: oropharynx clear Neck: supple, nontender Lungs:  Clear throughout to auscultation.   No wheezes, crackles, or rhonchi. No acute distress. Heart:  Regular rate and rhythm; no murmurs, clicks, rubs,  or gallops. Abdomen: diffusely tender with guarding, soft, nondistended, +BS  Rectal:  Deferred Ext: pulses intact, no edema Skin: no rash  GI:  Lab Results:  Recent Labs  04/08/15 1858 04/09/15 0738  WBC 14.7* 10.5  HGB 8.5* 9.2*  HCT 26.0* 27.5*  PLT 353 294   BMET  Recent Labs  04/08/15 1858  NA 136  K 3.8  CL 107  CO2 13*  GLUCOSE 148*  BUN 114*  CREATININE 3.42*  CALCIUM 8.7*   LFT No results for input(s): PROT, ALBUMIN, AST, ALT, ALKPHOS, BILITOT, BILIDIR, IBILI in the last 72 hours. PT/INR  Recent Labs  04/08/15 1858 04/09/15 0738  LABPROT 40.1* 21.7*  INR 4.29* 1.90*     Studies/Results: Us Renal  04/09/2015   CLINICAL DATA:  Acute renal insufficiency.  EXAM: RENAL / URINARY TRACT ULTRASOUND COMPLETE  COMPARISON:  09/10/2013.  FINDINGS: Right Kidney:  Length: 13.2 cm.  Echogenicity within normal limits. No assault mass or hydronephrosis visualized. 1.5 cm lower pole simple cyst.  Left Kidney:  Length: 11.9 cm. Echogenicity within normal limits. No solid mass or hydronephrosis visualized. 2.4 cm parapelvic cyst.  Bladder:  Appears normal for degree of bladder distention.  IMPRESSION: 1.  No acute abnormality.  No hydronephrosis or bladder distention.  2.  Simple cyst right lower pole.  Parapelvic cyst left kidney.   Electronically Signed   By: Thomas  Register   On: 04/09/2015 07:41   Dg Chest Port 1 View  04/08/2015   CLINICAL DATA:  Cough, weakness, shortness of breath, diagnosed with pneumonia 2 weeks ago  EXAM: PORTABLE CHEST - 1 VIEW  COMPARISON:    CT chest dated 03/25/2015  FINDINGS: Mild patchy opacity at the left lung base is favored to reflect subpleural scarring/ atelectasis, less likely pneumonia. Suspected chronic pleural thickening without definite pleural effusion (when correlating with CT). No pneumothorax.  The heart is normal in size.  IMPRESSION: Mild patchy opacity at the left lung base, favored to reflect subpleural scarring/atelectasis, less likely pneumonia.   Electronically Signed   By: Sriyesh  Krishnan M.D.   On: 04/08/2015 19:56    Impression/Plan: 68 yo with recent onset of melena in the setting of anticoagulation with Coumadin in need of an upper endoscopy to look for an ulcer. INR corrected. Agree with Protonix drip. NPO p MN. EGD tomorrow morning to further evaluate GI bleed.    LOS: 1 day   Hughie Melroy C.  04/09/2015, 8:38 AM  Pager [phone]  If no answer or after 5 PM call [phone] 

## 2015-04-10 ENCOUNTER — Inpatient Hospital Stay (HOSPITAL_COMMUNITY): Payer: Medicare Other | Admitting: Certified Registered Nurse Anesthetist

## 2015-04-10 ENCOUNTER — Encounter (HOSPITAL_COMMUNITY)
Admission: EM | Disposition: A | Discharge status: Home / Self Care | Payer: Self-pay | Source: Home / Self Care | Attending: Internal Medicine

## 2015-04-10 ENCOUNTER — Encounter (HOSPITAL_COMMUNITY): Payer: Self-pay | Admitting: Certified Registered Nurse Anesthetist

## 2015-04-10 DIAGNOSIS — K922 Gastrointestinal hemorrhage, unspecified: Secondary | ICD-10-CM | POA: Insufficient documentation

## 2015-04-10 HISTORY — PX: ESOPHAGOGASTRODUODENOSCOPY: SHX5428

## 2015-04-10 LAB — CBC
HCT: 25.6 % — ABNORMAL LOW (ref 39.0–52.0)
HCT: 26.1 % — ABNORMAL LOW (ref 39.0–52.0)
HCT: 27 % — ABNORMAL LOW (ref 39.0–52.0)
HCT: 27 % — ABNORMAL LOW (ref 39.0–52.0)
HEMOGLOBIN: 8.9 g/dL — AB (ref 13.0–17.0)
Hemoglobin: 8.6 g/dL — ABNORMAL LOW (ref 13.0–17.0)
Hemoglobin: 8.9 g/dL — ABNORMAL LOW (ref 13.0–17.0)
Hemoglobin: 8.7 g/dL — ABNORMAL LOW (ref 13.0–17.0)
MCH: 26.6 pg (ref 26.0–34.0)
MCH: 27.1 pg (ref 26.0–34.0)
MCH: 26.6 pg (ref 26.0–34.0)
MCH: 27.6 pg (ref 26.0–34.0)
MCHC: 33 g/dL (ref 30.0–36.0)
MCHC: 33 g/dL (ref 30.0–36.0)
MCHC: 33 g/dL (ref 30.0–36.0)
MCHC: 34 g/dL (ref 30.0–36.0)
MCV: 80.6 fL (ref 78.0–100.0)
MCV: 80.8 fL (ref 78.0–100.0)
MCV: 81.3 fL (ref 78.0–100.0)
MCV: 82.1 fL (ref 78.0–100.0)
PLATELETS: 270 10*3/uL (ref 150–400)
Platelets: 279 10*3/uL (ref 150–400)
Platelets: 285 10*3/uL (ref 150–400)
Platelets: 312 10*3/uL (ref 150–400)
RBC: 3.15 MIL/uL — AB (ref 4.22–5.81)
RBC: 3.23 MIL/uL — AB (ref 4.22–5.81)
RBC: 3.29 MIL/uL — ABNORMAL LOW (ref 4.22–5.81)
RBC: 3.35 MIL/uL — AB (ref 4.22–5.81)
RDW: 18.8 % — ABNORMAL HIGH (ref 11.5–15.5)
RDW: 18.9 % — ABNORMAL HIGH (ref 11.5–15.5)
RDW: 19 % — AB (ref 11.5–15.5)
RDW: 19.1 % — AB (ref 11.5–15.5)
WBC: 6.7 10*3/uL (ref 4.0–10.5)
WBC: 7.3 10*3/uL (ref 4.0–10.5)
WBC: 7.6 10*3/uL (ref 4.0–10.5)
WBC: 8.1 10*3/uL (ref 4.0–10.5)

## 2015-04-10 LAB — TYPE AND SCREEN
ABO/RH(D): B POS
Antibody Screen: NEGATIVE
UNIT DIVISION: 0
Unit division: 0

## 2015-04-10 LAB — FOLATE: Folate: 20.1 ng/mL (ref >5.9)

## 2015-04-10 LAB — BASIC METABOLIC PANEL
ANION GAP: 8 (ref 5–15)
BUN: 64 mg/dL — ABNORMAL HIGH (ref 6–20)
CHLORIDE: 117 mmol/L — AB (ref 101–111)
CO2: 15 mmol/L — ABNORMAL LOW (ref 22–32)
CREATININE: 1.95 mg/dL — AB (ref 0.61–1.24)
Calcium: 8.6 mg/dL — ABNORMAL LOW (ref 8.9–10.3)
GFR calc Af Amer: 39 mL/min — ABNORMAL LOW (ref >60)
GFR calc non Af Amer: 33 mL/min — ABNORMAL LOW (ref >60)
Glucose, Bld: 63 mg/dL — ABNORMAL LOW (ref 65–99)
POTASSIUM: 3.8 mmol/L (ref 3.5–5.1)
Sodium: 140 mmol/L (ref 135–145)

## 2015-04-10 LAB — URINE CULTURE: Culture: NO GROWTH

## 2015-04-10 LAB — IRON AND TIBC
Iron: 30 ug/dL — ABNORMAL LOW (ref 45–182)
Saturation Ratios: 11 % — ABNORMAL LOW (ref 17.9–39.5)
TIBC: 274 ug/dL (ref 250–450)
UIBC: 244 ug/dL

## 2015-04-10 LAB — RETICULOCYTES
RBC.: 3.29 MIL/uL — ABNORMAL LOW (ref 4.22–5.81)
Retic Count, Absolute: 65.8 10*3/uL (ref 19.0–186.0)
Retic Ct Pct: 2 % (ref 0.4–3.1)

## 2015-04-10 LAB — GLUCOSE, CAPILLARY
GLUCOSE-CAPILLARY: 81 mg/dL (ref 65–99)
Glucose-Capillary: 59 mg/dL — ABNORMAL LOW (ref 65–99)

## 2015-04-10 LAB — VITAMIN B12: Vitamin B-12: 142 pg/mL — ABNORMAL LOW (ref 180–914)

## 2015-04-10 LAB — PROTIME-INR
INR: 1.48 (ref 0.00–1.49)
Prothrombin Time: 18 seconds — ABNORMAL HIGH (ref 11.6–15.2)

## 2015-04-10 LAB — FERRITIN: Ferritin: 181 ng/mL (ref 24–336)

## 2015-04-10 SURGERY — EGD (ESOPHAGOGASTRODUODENOSCOPY)
Anesthesia: Monitor Anesthesia Care

## 2015-04-10 MED ORDER — PROPOFOL 10 MG/ML IV BOLUS
INTRAVENOUS | Status: DC | PRN
  Administered 2015-04-10: 30 mg via INTRAVENOUS

## 2015-04-10 MED ORDER — PROPOFOL INFUSION 10 MG/ML OPTIME
INTRAVENOUS | Status: DC | PRN
  Administered 2015-04-10: 100 ug/kg/min via INTRAVENOUS

## 2015-04-10 MED ORDER — BUTAMBEN-TETRACAINE-BENZOCAINE 2-2-14 % EX AERO
INHALATION_SPRAY | CUTANEOUS | Status: DC | PRN
  Administered 2015-04-10: 2 via TOPICAL

## 2015-04-10 MED ORDER — DEXTROSE 50 % IV SOLN
INTRAVENOUS | Status: DC | PRN
  Administered 2015-04-10: 12.5 g via INTRAVENOUS

## 2015-04-10 MED ORDER — LACTATED RINGERS IV SOLN
INTRAVENOUS | Status: DC | PRN
  Administered 2015-04-10: 09:00:00 via INTRAVENOUS

## 2015-04-10 MED ORDER — DEXTROSE 50 % IV SOLN
INTRAVENOUS | Status: AC
  Filled 2015-04-10: qty 50

## 2015-04-10 MED ORDER — LACTATED RINGERS IV SOLN
Freq: Once | INTRAVENOUS | Status: AC
  Administered 2015-04-10: 1000 mL via INTRAVENOUS

## 2015-04-10 MED ORDER — CYANOCOBALAMIN 500 MCG PO TABS
500.0000 ug | ORAL_TABLET | Freq: Every day | ORAL | Status: DC
  Administered 2015-04-10 – 2015-04-13 (×4): 500 ug via ORAL
  Filled 2015-04-10 (×6): qty 1

## 2015-04-10 MED ORDER — EPINEPHRINE HCL 0.1 MG/ML IJ SOSY
PREFILLED_SYRINGE | INTRAMUSCULAR | Status: AC
  Filled 2015-04-10: qty 10

## 2015-04-10 MED ORDER — SODIUM CHLORIDE 0.9 % IJ SOLN
PREFILLED_SYRINGE | INTRAMUSCULAR | Status: DC | PRN
  Administered 2015-04-10: 2 mL

## 2015-04-10 NOTE — Transfer of Care (Signed)
Immediate Anesthesia Transfer of Care Note  Patient: Tony Zhang  Procedure(s) Performed: Procedure(s): ESOPHAGOGASTRODUODENOSCOPY (EGD) (N/A)  Patient Location: PACU Endoscopy  Anesthesia Type:MAC  Level of Consciousness: awake, alert , oriented and patient cooperative  Airway & Oxygen Therapy: Patient Spontanous Breathing and Patient connected to nasal cannula oxygen  Post-op Assessment: Report given to RN and Post -op Vital signs reviewed and stable  Post vital signs: Reviewed and stable  Last Vitals:  Filed Vitals:   04/10/15 0816  BP: 138/68  Pulse:   Temp: 36.9 C  Resp: 18    Complications: No apparent anesthesia complications

## 2015-04-10 NOTE — Anesthesia Procedure Notes (Signed)
Procedure Name: MAC Date/Time: 04/10/2015 9:16 AM Performed by: Demetrio Lapping Pre-anesthesia Checklist: Patient identified, Emergency Drugs available, Suction available, Patient being monitored and Timeout performed Patient Re-evaluated:Patient Re-evaluated prior to inductionOxygen Delivery Method: Nasal cannula Intubation Type: IV induction Dental Injury: Teeth and Oropharynx as per pre-operative assessment

## 2015-04-10 NOTE — Brief Op Note (Signed)
EGD with small clean-based ulcer without active bleeding. S/P diluted epi mixture due to bleeding at biopsy site and having abdominal pain from that now. Use pain meds prn that he already has ordered. Will need colonoscopy prior to resuming anticoagulation and will prep tomorrow for procedure to be done on 04/12/15.

## 2015-04-10 NOTE — Anesthesia Preprocedure Evaluation (Addendum)
Anesthesia Evaluation  Patient identified by MRN, date of birth, ID band Patient awake    Reviewed: Allergy & Precautions, NPO status , Patient's Chart, lab work & pertinent test results, reviewed documented beta blocker date and time   History of Anesthesia Complications Negative for: history of anesthetic complications  Airway Mallampati: II  TM Distance: >3 FB Neck ROM: Full    Dental  (+) Edentulous Upper, Edentulous Lower   Pulmonary shortness of breath and with exertion, sleep apnea , COPD COPD inhaler, former smoker,  Loculated pleural effusion/empyema breath sounds clear to auscultation        Cardiovascular hypertension, Pt. on medications and Pt. on home beta blockers - angina+ Peripheral Vascular Disease (AAA, renal artery stenosis) and DVT Rhythm:Regular Rate:Normal     Neuro/Psych negative neurological ROS     GI/Hepatic Neg liver ROS, GERD-  Poorly Controlled,  Endo/Other  negative endocrine ROS  Renal/GU Renal InsufficiencyRenal disease     Musculoskeletal   Abdominal   Peds  Hematology  (+) Blood dyscrasia (Hb 11.9, coumadin 1.36), anemia ,   Anesthesia Other Findings   Reproductive/Obstetrics                             Anesthesia Physical  Anesthesia Plan  ASA: III  Anesthesia Plan: MAC   Post-op Pain Management:    Induction: Intravenous  Airway Management Planned:   Additional Equipment:   Intra-op Plan:   Post-operative Plan:   Informed Consent: I have reviewed the patients History and Physical, chart, labs and discussed the procedure including the risks, benefits and alternatives for the proposed anesthesia with the patient or authorized representative who has indicated his/her understanding and acceptance.   Dental advisory given  Plan Discussed with: CRNA  Anesthesia Plan Comments:         Anesthesia Quick Evaluation

## 2015-04-10 NOTE — Anesthesia Postprocedure Evaluation (Signed)
Anesthesia Post Note  Patient: Tony Zhang  Procedure(s) Performed: Procedure(s) (LRB): ESOPHAGOGASTRODUODENOSCOPY (EGD) (N/A)  Anesthesia type: MAC  Patient location: PACU  Post pain: Pain level controlled  Post assessment: Post-op Vital signs reviewed  Last Vitals: BP 103/73 mmHg  Pulse 79  Temp(Src) 36.5 C (Oral)  Resp 18  Ht 6\' 1"  (1.854 m)  Wt 219 lb 5.7 oz (99.5 kg)  BMI 28.95 kg/m2  SpO2 99%  Post vital signs: Reviewed  Level of consciousness: awake  Complications: No apparent anesthesia complications

## 2015-04-10 NOTE — Interval H&P Note (Signed)
History and Physical Interval Note:  04/10/2015 9:10 AM  Tony Zhang  has presented today for surgery, with the diagnosis of melana  The various methods of treatment have been discussed with the patient and family. After consideration of risks, benefits and other options for treatment, the patient has consented to  Procedure(s): ESOPHAGOGASTRODUODENOSCOPY (EGD) (N/A) as a surgical intervention .  The patient's history has been reviewed, patient examined, no change in status, stable for surgery.  I have reviewed the patient's chart and labs.  Questions were answered to the patient's satisfaction.     Tammee Thielke C.

## 2015-04-10 NOTE — Op Note (Signed)
[facility] [address] [place], [address]   ENDOSCOPY PROCEDURE REPORT  PATIENT: Tony Zhang, Tony Zhang  MR#: [id number] BIRTHDATE: 25-Jul-1946 , 68  yrs. old GENDER: male ENDOSCOPIST: Charlott Rakes, MD REFERRED BY: PROCEDURE DATE:  05/04/15 PROCEDURE:  EGD w/ biopsy ASA CLASS:     Class III INDICATIONS:  melena. MEDICATIONS: Monitored anesthesia care and Per Anesthesia TOPICAL ANESTHETIC:  DESCRIPTION OF PROCEDURE: After the risks benefits and alternatives of the procedure were thoroughly explained, informed consent was obtained.  The Pentax Gastroscope [id number] endoscope was introduced through the mouth and advanced to the second portion of the duodenum , Without limitations.  The instrument was slowly withdrawn as the mucosa was fully examined. Estimated blood loss is zero unless otherwise noted in this procedure report.    Mild desquamation noted in distal esophagus. Nodule in pyloric channel with small ulcer at tip without active bleeding. Stomach otherwise normal. Duodenal bulb and 2nd portion of the duodenum normal. Biopsy of pyloric channel nodule taken and due to continued oozing of blood 2 cc of diluted epinephrine mixture injected. Retroflexed views revealed no abnormalities.     The scope was then withdrawn from the patient and the procedure completed.  COMPLICATIONS: There were no immediate complications.  ENDOSCOPIC IMPRESSION:     Pyloric channel ulcer (no bleeding stigmata or active bleeding) and nodule - s/p biopsy Suspect mucosal bleeding from anticoagulation source of GI bleed   RECOMMENDATIONS:     F/U on path; Colonoscopy prior to resuming anticoagulation; Supportive care   eSigned:  Charlott Rakes, MD 05/04/15 9:53 AM    CC:  CPT CODES: ICD CODES:  The ICD and CPT codes recommended by this software are interpretations from the data that the clinical staff has captured with the software.  The verification of  the translation of this report to the ICD and CPT codes and modifiers is the sole responsibility of the health care institution and practicing physician where this report was generated.  [employer] will not be held responsible for the validity of the ICD and CPT codes included on this report.  AMA assumes no liability for data contained or not contained herein. CPT is a Publishing rights manager of the [employer].  PATIENT NAME:  Tony Zhang, Tony Zhang MR#: [id number]

## 2015-04-10 NOTE — Progress Notes (Signed)
PROGRESS NOTE  Tony HEGLUND CEY:[id number] DOB: Mar 20, 1946 DOA: 04/08/2015 PCP: [name], MD  Assessment/Plan: Acute blood loss anemia -04/08/2015-transfuse 2 units PRBC -Drop in hemoglobin partly secondary to dilution -monitor serial H/H-->remains largely stable Melena -pt had supratherapeutic INR in setting of chronic ibuprofen use -Patient was taking ibuprofen 800 mg 3 times a day for the past 20 years  -Continue Protonix drip--->defer to GI when to stop  -Appreciate GI consult  -EGD on 04/10/2015--clean based ulcer without active bleed -colonoscopy planned 6/24 -Hold Coumadin until cleared by GI to restart -Patient received vitamin K in the emergency department -hold Pletal 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) -multifactorial in the setting of NSAID and ACEi use and renal artery stenosis and hemodynamic changes due to ABLA -Renal ultrasound negative for hydronephrosis or bladder distention  -Discontinue ACE inhibitor and NSAIDs  -Continue IV fluids-->improving  -am BMP Recurrent DVT  -Patient's history is a little sketchy, but states that his last DVT was 15 years ago  -Patient states that he has had 3 DVTs, but not completely clear if he was on or off anticoagulation when he had his third DVT -hold coumadin until cleared by GI to restart Coagulopathy  -Patient had supratherapeutic INR at the time of admission--4.29 -Received vitamin K 10 g IV 1-->INR 1.48 COPD  -Stable on room air  -Patient had empyema with decortication February 2016 -Chest x-ray shows left basilar atelectasis/scarring HTN -BPs soft but improving -continue to hold bystolic and amlodipine Gouty arthritis  -Stable  -Continue allopurinol  Hyperlipidemia  -Hold the Zocor and fenofibrate until the patient is stable and able to take po Microcytic anemia -The patient's anemia is multifactorial including low serum B12 and probably iron deficiency with iron saturation 11% and  ferritin 181 B12 deficiency -Given the patient's coagulopathy and risk of bleeding, we'll supplement with po   Family Communication: Pt at beside Disposition Plan:transfer to telemetry          Procedures/Studies: Ct Chest W Contrast  03/25/2015   CLINICAL DATA:  History of pneumonia with loculated right pleural effusion, followup, also history of enlarged right hilar and paratracheal nodes, former smoking history, productive cough  EXAM: CT CHEST WITH CONTRAST  TECHNIQUE: Multidetector CT imaging of the chest was performed during intravenous contrast administration.  CONTRAST:  1 OMNIPAQUE IOHEXOL 300 MG/ML  SOLN  BUN and creatinine were obtained on site at [facility] at [address]Results: BUN 23 mg/dL, Creatinine 1.7 mg/dL.  COMPARISON:  CT chest of 11/18/2014  FINDINGS: As noted previously there is diffuse primarily centrilobular emphysema. The 7 mm nodule described previously in the right middle lobe has diminished in size and is located adjacent to the fissure, most likely a small perifissural noted which was inflammatory in nature. The 7 mm nodule just lateral to that prior nodule is no longer seen. No new or enlarging lung nodule is noted.  The previously noted loculated right pleural effusion has almost resolved with only a small opacity remaining most consistent with atelectasis. There is slightly more opacity however within the lateral aspect of the left lower lobe which may represent left pleural effusion and mild parenchymal opacity possibly representing pneumonia. Followup by clinical exam and chest x-ray is recommended. The central airway is patent.  On soft tissue window images, the thyroid gland is unremarkable. Moderate atheromatous change throughout the aortic arch and descending thoracic aorta is again noted. The pretracheal node has diminished in size  now measuring 7 mm in diameter. No enlarging adenopathy is noted. Coronary artery calcifications again are  present. The upper abdomen is unremarkable.  IMPRESSION: 1. Decrease in size of previously loculated right pleural effusion with residual small amount of fluid and atelectasis. 2. Slightly more opacity within the left lower may represent a patchy area of pneumonia with possibly a small adjacent effusion. 3. The previously noted lung nodules in the right middle lobe have either resolved or diminished in size. 4. Diffuse centrilobular emphysema.   Electronically Signed   By: Dwyane Dee M.D.   On: 03/25/2015 12:26   US Renal  04/09/2015   CLINICAL DATA:  Acute renal insufficiency.  EXAM: RENAL / URINARY TRACT ULTRASOUND COMPLETE  COMPARISON:  09/10/2013.  FINDINGS: Right Kidney:  Length: 13.2 cm. Echogenicity within normal limits. No assault mass or hydronephrosis visualized. 1.5 cm lower pole simple cyst.  Left Kidney:  Length: 11.9 cm. Echogenicity within normal limits. No solid mass or hydronephrosis visualized. 2.4 cm parapelvic cyst.  Bladder:  Appears normal for degree of bladder distention.  IMPRESSION: 1.  No acute abnormality.  No hydronephrosis or bladder distention.  2.  Simple cyst right lower pole.  Parapelvic cyst left kidney.   Electronically Signed   By: Maisie Fus  Register   On: 04/09/2015 07:41   Dg Chest Port 1 View  04/08/2015   CLINICAL DATA:  Cough, weakness, shortness of breath, diagnosed with pneumonia 2 weeks ago  EXAM: PORTABLE CHEST - 1 VIEW  COMPARISON:  CT chest dated 03/25/2015  FINDINGS: Mild patchy opacity at the left lung base is favored to reflect subpleural scarring/ atelectasis, less likely pneumonia. Suspected chronic pleural thickening without definite pleural effusion (when correlating with CT). No pneumothorax.  The heart is normal in size.  IMPRESSION: Mild patchy opacity at the left lung base, favored to reflect subpleural scarring/atelectasis, less likely pneumonia.   Electronically Signed   By: Charline Bills M.D.   On: 04/08/2015 19:56          Subjective: Patient complains of intermittent abdominal discomfort without any nausea, vomiting, diarrhea. Denies any fevers, chills, chest pain, dysuria, hematuria.  Objective: Filed Vitals:   04/10/15 0816 04/10/15 0954 04/10/15 1008 04/10/15 1033  BP: 138/68 125/50 130/54 135/66  Pulse:   74 72  Temp: 98.4 F (36.9 C) 97.6 F (36.4 C)  97.7 F (36.5 C)  TempSrc: Oral Oral  Oral  Resp: Height:      Weight:      SpO2: 99% 96% 98% 99%    Intake/Output Summary (Last 24 hours) at 04/10/15 1209 Last data filed at 04/10/15 1200  Gross per 24 hour  Intake   2675 ml  Output   1905 ml  Net    770 ml   Weight change:  Exam:   General:  Pt is alert, follows commands appropriately, not in acute distress  HEENT: No icterus, No thrush, No neck mass, [place]/AT  Cardiovascular: RRR, S1/S2, no rubs, no gallops  Respiratory: Left basilar crackles, R-CTA  Abdomen: Soft/+BS,spigastric pain without rebound; non distended, no guarding; no hepatosplenomegaly  Extremities: No edema, No lymphangitis, No petechiae, No rashes, no synovitis; no cyanosis or clubbing  Data Reviewed: Basic Metabolic Panel:  Recent Labs Lab 04/08/15 1858 04/09/15 0738 04/10/15 0634  NA 136 138 140  K 3.8 3.9 3.8  CL 107 114* 117*  CO2 13* 14* 15*  GLUCOSE 148* 97 63*  BUN 114* 95* 64*  CREATININE 3.42*  2.92* 1.95*  CALCIUM 8.7* 8.6* 8.6*   Liver Function Tests:  Recent Labs Lab 04/09/15 0738  AST 12*  ALT 9*  ALKPHOS 44  BILITOT 1.1  PROT 6.4*  ALBUMIN 2.9*   No results for input(s): LIPASE, AMYLASE in the last 168 hours. No results for input(s): AMMONIA in the last 168 hours. CBC:  Recent Labs Lab 04/09/15 0738 04/09/15 1220 04/09/15 1800 04/10/15 0007 04/10/15 0634  WBC 10.5 9.8 9.6 8.1 7.3  HGB 9.2* 9.0* 9.1* 8.9* 8.6*  HCT 27.5* 26.7* 27.1* 27.0* 26.1*  MCV 80.4 79.7 80.2 82.1 80.8  PLT 294 318 284 279 285   Cardiac Enzymes: No results for  input(s): CKTOTAL, CKMB, CKMBINDEX, TROPONINI in the last 168 hours. BNP: Invalid input(s): POCBNP CBG:  Recent Labs Lab 04/10/15 0909 04/10/15 1006  GLUCAP 59* 81    Recent Results (from the past 240 hour(s))  MRSA PCR Screening     Status: Zhang   Collection Time: 04/09/15 12:15 AM  Result Value Ref Range Status   MRSA by PCR NEGATIVE NEGATIVE Final    Comment:        The GeneXpert MRSA Assay (FDA approved for NASAL specimens only), is one component of a comprehensive MRSA colonization surveillance program. It is not intended to diagnose MRSA infection nor to guide or monitor treatment for MRSA infections.   Urine culture     Status: Zhang   Collection Time: 04/09/15 12:48 AM  Result Value Ref Range Status   Specimen Description URINE, RANDOM  Final   Special Requests Zhang  Final   Culture NO GROWTH 1 DAY  Final   Report Status 04/10/2015 FINAL  Final     Scheduled Meds: . dextromethorphan-guaiFENesin  1 tablet Oral BID  . [START ON 04/12/2015] pantoprazole (PROTONIX) IV  40 mg Intravenous Q12H  . sodium chloride  3 mL Intravenous Q12H   Continuous Infusions: . sodium chloride 100 mL/hr at 04/09/15 2313  . pantoprozole (PROTONIX) infusion 8 mg/hr (04/10/15 1200)     Emmeline Winebarger, DO  [facility] Pager [phone]  If 7PM-7AM, please contact night-coverage [email or web address] Password [email or web address] 04/10/2015, 12:09 PM   LOS: 2 days

## 2015-04-10 NOTE — Progress Notes (Signed)
Called report to [place], [place] on [address]. Pt is alert and oriented, VSS, personal belongings with pt. CCMD and Elink notified, pt transported on monitor.

## 2015-04-10 NOTE — Progress Notes (Signed)
Transfer report received from [facility] at 1325 and pt arrived to the unit at 1400 via wheelchair with belongings to the side. Pt A&O x4; VSS, telemetry applied and verified; no pressure ulcer noted on skin; pt stable during shift; pt in bed comfortably with call light within reach. Will closely monitor and report off to incoming RN. Arabella Merles Kylinn Shropshire RN.

## 2015-04-10 NOTE — Evaluation (Signed)
Occupational Therapy Evaluation Patient Details Name: Tony Zhang MRN: [id number] DOB: 18-Feb-1946 Today's Date: 04/10/2015    History of Present Illness Tony Zhang is a 69 y.o. male with PMH of hypertension, hyperlipidemia, gout, recurrent DVT, remote history of PE 1980, PVD, AAA, COPD, DJD, who presents with a dark stool, shortness of breath and weakness.   Clinical Impression   Pt currently with slight decreased dynamic balance as well as endurance for selfcare tasks.  Min guard assist for balance with pt currently refusing use of assistive device as he thinks he has enough items at home to hold onto as he walks around.  In addition, feel he needs tub shower seat but he denies wanting this as well.  Will benefit from acute care OT to help increase endurance, balance, and safety for selfcare tasks.  No follow-up post acute OT recommended at discharge.     Follow Up Recommendations  No OT follow up    Equipment Recommendations  None recommended by OT (Pt would benefit from tub seat but does not want)       Precautions / Restrictions Precautions Precautions: Fall Precaution Comments: pt reports that he has had several "near falls" at home since last sx but has gotten somewhere to lie down and rest each time Restrictions Weight Bearing Restrictions: No      Mobility Bed Mobility Overal bed mobility: Modified Independent                Transfers Overall transfer level: Needs assistance   Transfers: Sit to/from Stand;Stand Pivot Transfers Sit to Stand: Min guard Stand pivot transfers: Min guard       General transfer comment: Pt using wall and counter for balance with UEs when attempting to ambulate to the toilet.      Balance Overall balance assessment: Needs assistance   Sitting balance-Leahy Scale: Normal       Standing balance-Leahy Scale: Good                              ADL Overall ADL's : Needs assistance/impaired Eating/Feeding:  Independent   Grooming: Wash/dry hands;Wash/dry face;Supervision/safety   Upper Body Bathing: Supervision/ safety;Sitting   Lower Body Bathing: Min guard;Sit to/from stand   Upper Body Dressing : Supervision/safety;Sitting   Lower Body Dressing: Supervision/safety;Sit to/from stand   Toilet Transfer: Min guard   Toileting- Architect and Hygiene: Min guard;Sit to/from stand       Functional mobility during ADLs: Min guard General ADL Comments: Pt with slight decreased dynamic balance with mobility and transfers.  Pt reports he has walls all around his mobile home so he can't fall.  Therapist attempted to convince him that using a cane or assistive device would increase his safety, however he was not convinced.  Also discussed the need for a shower seat as well.  Pt not open to use of DME at this time.  Pt with increased fatigue with ambulation in the hallway.  Oxygen sats however maintained greater than 94%.       Vision Vision Assessment?: No apparent visual deficits   Perception Perception Perception Tested?: No   Praxis Praxis Praxis tested?: Not tested    Pertinent Vitals/Pain Pain Assessment: 0-10 Faces Pain Scale: Hurts even more Pain Location: right side Pain Intervention(s): Limited activity within patient's tolerance     Hand Dominance Right   Extremity/Trunk Assessment Upper Extremity Assessment Upper Extremity Assessment: Generalized weakness   Lower  Extremity Assessment Lower Extremity Assessment: Defer to PT evaluation   Cervical / Trunk Assessment Cervical / Trunk Assessment: Kyphotic   Communication Communication Communication: No difficulties   Cognition Arousal/Alertness: Awake/alert Behavior During Therapy: WFL for tasks assessed/performed Overall Cognitive Status: Within Functional Limits for tasks assessed                                Home Living Family/patient expects to be discharged to:: Private  residence Living Arrangements: Alone   Type of Home: Mobile home Home Access: Stairs to enter [employer] of Steps: 4 Entrance Stairs-Rails: Right;Left Home Layout: One level     Bathroom Shower/Tub: Tub/shower unit Shower/tub characteristics: Engineer, building services: Standard     Home Equipment: None               OT Diagnosis: Generalized weakness   OT Problem List: Decreased strength;Decreased activity tolerance;Impaired balance (sitting and/or standing);Decreased knowledge of use of DME or AE;Cardiopulmonary status limiting activity   OT Treatment/Interventions: Self-care/ADL training;Patient/family education;Therapeutic exercise;Energy conservation;DME and/or AE instruction;Balance training    OT Goals(Current goals can be found in the care plan section) Acute Rehab OT Goals Patient Stated Goal: Pt did not state but wants to stop getting PNA. OT Goal Formulation: With patient Time For Goal Achievement: 04/17/15 Potential to Achieve Goals: Good  OT Frequency: Min 2X/week   Barriers to D/C: Decreased caregiver support  pt lives alone          End of Session Nurse Communication: Mobility status  Activity Tolerance: Patient limited by fatigue Patient left: in bed;with call bell/phone within reach;with bed alarm set   Time: 1540-1607 OT Time Calculation (min): 27 min Charges:  OT General Charges $OT Visit: 1 Procedure OT Evaluation $Initial OT Evaluation Tier I: 1 Procedure OT Treatments $Self Care/Home Management : 8-22 mins  Labrandon Knoch OTR/L 04/10/2015, 4:36 PM

## 2015-04-10 NOTE — H&P (View-Only) (Signed)
Referring Provider: Dr. Arbutus Leas Primary Care Physician:  [name], MD Primary Gastroenterologist:  Gentry Fitz  Reason for Consultation:  Melena  HPI: Tony Zhang is a 69 y.o. male who has had 3 episodes of black stools in the past 4 days without associated abdominal pain. +N without vomiting. Felt lightheaded last night. Denies hematemesis, chest pain, or shortness of breath. On Coumadin for hx of PE/DVT and INR 4.29 on admit. Hgb 8.5. Has been using Ibuprofen TID prn and used it yesterday and earlier this week. No history of peptic ulcer disease and has never had an endoscopy. Reports having a colonoscopy 20 years ago but results unknown.  Past Medical History  Diagnosis Date  . Venous thrombosis     Recurrent  . Peripheral vascular disease   . Dyslipidemia   . AAA (abdominal aortic aneurysm)   . COPD (chronic obstructive pulmonary disease)   . Gout   . DJD (degenerative joint disease)   . Malignant hypertension   . Shortness of breath   . Pneumonia 08/2013    pt. reports that he is having this surgery 11/25/2014- for the lung problem that began with pneumonia in 09/2014  . 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      Past Surgical History  Procedure Laterality Date  . Back surgery    . Hernia repair    . Appendectomy    . Shoulder surgery Right   . Eye surgery      both eyes, cataracts removed, denies lens implants  . Video bronchoscopy N/A 11/25/2014    Procedure: VIDEO BRONCHOSCOPY;  Surgeon: Delight Ovens, MD;  Location: [facility] OR;  Service: Thoracic;  Laterality: N/A;  . Video assisted thoracoscopy Right 11/25/2014    Procedure: VIDEO ASSISTED THORACOSCOPY;  Surgeon: Delight Ovens, MD;  Location: [facility] OR;  Service: Thoracic;  Laterality: Right;  . Empyema drainage N/A 11/25/2014    Procedure: EMPYEMA DRAINAGE;  Surgeon: Delight Ovens, MD;  Location: [facility] OR;  Service: Thoracic;  Laterality: N/A;    Prior  to Admission medications   Medication Sig Start Date End Date Taking? Authorizing Provider  allopurinol (ZYLOPRIM) 300 MG tablet Take 300 mg by mouth daily. 04/03/15  Yes Historical Provider, MD  amLODipine (NORVASC) 2.5 MG tablet Take 2.5 mg by mouth daily. 04/03/15  Yes Historical Provider, MD  benazepril (LOTENSIN) 40 MG tablet Take 40 mg by mouth daily. 04/03/15  Yes Historical Provider, MD  cilostazol (PLETAL) 100 MG tablet TAKE ONE TABLET BY MOUTH TWICE DAILY 02/18/15  Yes Mihai Croitoru, MD  fenofibrate 160 MG tablet TAKE 1 TABLET BY MOUTH DAILY 12/12/14  Yes Mihai Croitoru, MD  HYDROcodone-acetaminophen (NORCO) 10-325 MG per tablet Take 1 tablet by mouth every 4 (four) hours as needed for severe pain. 11/30/14  Yes Donielle Margaretann Loveless, PA-C  ibuprofen (ADVIL,MOTRIN) 800 MG tablet Take 800 mg by mouth every 8 (eight) hours as needed for moderate pain.   Yes Historical Provider, MD  nebivolol (BYSTOLIC) 5 MG tablet Take 1 tablet (5 mg total) by mouth daily. 11/30/14  Yes Donielle Margaretann Loveless, PA-C  PROAIR HFA 108 (90 BASE) MCG/ACT inhaler Inhale 3-4 puffs into the lungs every 4 (four) hours as needed for wheezing or shortness of breath.  10/31/14  Yes Historical Provider, MD  simvastatin (ZOCOR) 40 MG tablet Take 1 tablet (40 mg total) by mouth daily at 6 PM. 12/06/14  Yes Mihai  Croitoru, MD  warfarin (COUMADIN) 2.5 MG tablet TAKE 1 TABLET BY MOUTH DAILY OR AS DIRECTED Patient taking differently: Take 2.5 mg by mouth daily.  03/05/15  Yes Ladene Artist, MD    Scheduled Meds: . dextromethorphan-guaiFENesin  1 tablet Oral BID  . [START ON 04/12/2015] pantoprazole (PROTONIX) IV  40 mg Intravenous Q12H  . sodium chloride  3 mL Intravenous Q12H   Continuous Infusions: . sodium chloride 100 mL/hr at 04/09/15 0343  . pantoprozole (PROTONIX) infusion 8 mg/hr (04/09/15 0600)   PRN Meds:.albuterol, hydrALAZINE, hydrOXYzine, morphine injection  Allergies as of 04/08/2015 - Review Complete 04/08/2015   Allergen Reaction Noted  . Codeine Hives and Other (See Comments) 09/29/2011    No family history on file.  History   Social History  . Marital Status: Divorced    Spouse Name: N/A  . Number of Children: N/A  . Years of Education: N/A   Occupational History  . Not on file.   Social History Main Topics  . Smoking status: Former Smoker    Quit date: 10/18/1992  . Smokeless tobacco: Never Used  . Alcohol Use: No  . Drug Use: No  . Sexual Activity: Not on file   Other Topics Concern  . Not on file   Social History Narrative    Review of Systems: All negative except as stated above in HPI.  Physical Exam: Vital signs: Filed Vitals:   04/09/15 0341  BP: 91/37  Pulse: 83  Temp: 97.8 F (36.6 C)  Resp: 17   Last BM Date: 04/08/15 General:   Disheveled, lethargic, no acute distress Head: atraumatic Eyes: pupils equal and reactive, anicteric sclera ENT: oropharynx clear Neck: supple, nontender Lungs:  Clear throughout to auscultation.   No wheezes, crackles, or rhonchi. No acute distress. Heart:  Regular rate and rhythm; no murmurs, clicks, rubs,  or gallops. Abdomen: diffusely tender with guarding, soft, nondistended, +BS  Rectal:  Deferred Ext: pulses intact, no edema Skin: no rash  GI:  Lab Results:  Recent Labs  04/08/15 1858 04/09/15 0738  WBC 14.7* 10.5  HGB 8.5* 9.2*  HCT 26.0* 27.5*  PLT 353 294   BMET  Recent Labs  04/08/15 1858  NA 136  K 3.8  CL 107  CO2 13*  GLUCOSE 148*  BUN 114*  CREATININE 3.42*  CALCIUM 8.7*   LFT No results for input(s): PROT, ALBUMIN, AST, ALT, ALKPHOS, BILITOT, BILIDIR, IBILI in the last 72 hours. PT/INR  Recent Labs  04/08/15 1858 04/09/15 0738  LABPROT 40.1* 21.7*  INR 4.29* 1.90*     Studies/Results: US Renal  04/09/2015   CLINICAL DATA:  Acute renal insufficiency.  EXAM: RENAL / URINARY TRACT ULTRASOUND COMPLETE  COMPARISON:  09/10/2013.  FINDINGS: Right Kidney:  Length: 13.2 cm.  Echogenicity within normal limits. No assault mass or hydronephrosis visualized. 1.5 cm lower pole simple cyst.  Left Kidney:  Length: 11.9 cm. Echogenicity within normal limits. No solid mass or hydronephrosis visualized. 2.4 cm parapelvic cyst.  Bladder:  Appears normal for degree of bladder distention.  IMPRESSION: 1.  No acute abnormality.  No hydronephrosis or bladder distention.  2.  Simple cyst right lower pole.  Parapelvic cyst left kidney.   Electronically Signed   By: Maisie Fus  Register   On: 04/09/2015 07:41   Dg Chest Port 1 View  04/08/2015   CLINICAL DATA:  Cough, weakness, shortness of breath, diagnosed with pneumonia 2 weeks ago  EXAM: PORTABLE CHEST - 1 VIEW  COMPARISON:  CT chest dated 03/25/2015  FINDINGS: Mild patchy opacity at the left lung base is favored to reflect subpleural scarring/ atelectasis, less likely pneumonia. Suspected chronic pleural thickening without definite pleural effusion (when correlating with CT). No pneumothorax.  The heart is normal in size.  IMPRESSION: Mild patchy opacity at the left lung base, favored to reflect subpleural scarring/atelectasis, less likely pneumonia.   Electronically Signed   By: Charline Bills M.D.   On: 04/08/2015 19:56    Impression/Plan: 69 yo with recent onset of melena in the setting of anticoagulation with Coumadin in need of an upper endoscopy to look for an ulcer. INR corrected. Agree with Protonix drip. NPO p MN. EGD tomorrow morning to further evaluate GI bleed.    LOS: 1 day   Develle Sievers C.  04/09/2015, 8:38 AM  Pager [phone]  If no answer or after 5 PM call [phone]

## 2015-04-11 ENCOUNTER — Encounter (HOSPITAL_COMMUNITY): Payer: Self-pay | Admitting: Gastroenterology

## 2015-04-11 DIAGNOSIS — I82402 Acute embolism and thrombosis of unspecified deep veins of left lower extremity: Secondary | ICD-10-CM

## 2015-04-11 LAB — CBC
HEMATOCRIT: 27.9 % — AB (ref 39.0–52.0)
Hemoglobin: 9 g/dL — ABNORMAL LOW (ref 13.0–17.0)
MCH: 26.3 pg (ref 26.0–34.0)
MCHC: 32.3 g/dL (ref 30.0–36.0)
MCV: 81.6 fL (ref 78.0–100.0)
Platelets: 297 10*3/uL (ref 150–400)
RBC: 3.42 MIL/uL — ABNORMAL LOW (ref 4.22–5.81)
RDW: 19.1 % — ABNORMAL HIGH (ref 11.5–15.5)
WBC: 6.9 10*3/uL (ref 4.0–10.5)

## 2015-04-11 LAB — BASIC METABOLIC PANEL
Anion gap: 8 (ref 5–15)
BUN: 33 mg/dL — AB (ref 6–20)
CHLORIDE: 115 mmol/L — AB (ref 101–111)
CO2: 16 mmol/L — ABNORMAL LOW (ref 22–32)
Calcium: 8.8 mg/dL — ABNORMAL LOW (ref 8.9–10.3)
Creatinine, Ser: 1.32 mg/dL — ABNORMAL HIGH (ref 0.61–1.24)
GFR calc non Af Amer: 53 mL/min — ABNORMAL LOW (ref >60)
Glucose, Bld: 83 mg/dL (ref 65–99)
Potassium: 4 mmol/L (ref 3.5–5.1)
Sodium: 139 mmol/L (ref 135–145)

## 2015-04-11 LAB — PROTIME-INR
INR: 1.43 (ref 0.00–1.49)
PROTHROMBIN TIME: 17.5 s — AB (ref 11.6–15.2)

## 2015-04-11 MED ORDER — SODIUM CHLORIDE 0.9 % IV SOLN
INTRAVENOUS | Status: DC
Start: 2015-04-11 — End: 2015-04-14
  Administered 2015-04-11: 10 mL/h via INTRAVENOUS
  Administered 2015-04-13: 500 mL via INTRAVENOUS

## 2015-04-11 MED ORDER — PEG 3350-KCL-NA BICARB-NACL 420 G PO SOLR
4000.0000 mL | Freq: Once | ORAL | Status: AC
  Administered 2015-04-11: 4000 mL via ORAL
  Filled 2015-04-11: qty 4000

## 2015-04-11 NOTE — Progress Notes (Signed)
Patient ID: Tony Zhang, male   DOB: 01/21/1946, 69 y.o.   MRN: [id number] [facility] Progress Note  Tony Zhang 69 y.o. 08-03-1946   Subjective: Feels ok. No melena.  Objective: Vital signs in last 24 hours: Filed Vitals:   04/11/15 0430  BP: 126/60  Pulse: 71  Temp: 98.6 F (37 C)  Resp: 18    Physical Exam: Gen: alert, no acute distress CV: RRR Chest: CTA anteriorly Abd: epigastric and periumbilical tenderness with guarding, soft, nondistended, +BS Ext: no edema  Lab Results:  Recent Labs  04/10/15 0634 04/11/15 0354  NA 140 139  K 3.8 4.0  CL 117* 115*  CO2 15* 16*  GLUCOSE 63* 83  BUN 64* 33*  CREATININE 1.95* 1.32*  CALCIUM 8.6* 8.8*    Recent Labs  04/09/15 0738  AST 12*  ALT 9*  ALKPHOS 44  BILITOT 1.1  PROT 6.4*  ALBUMIN 2.9*    Recent Labs  04/10/15 1825 04/11/15 0354  WBC 6.7 6.9  HGB 8.7* 9.0*  HCT 25.6* 27.9*  MCV 81.3 81.6  PLT 270 297    Recent Labs  04/10/15 0634 04/11/15 0354  LABPROT 18.0* 17.5*  INR 1.48 1.43      Assessment/Plan: 69 yo s/p GI bleed manifested as melena in the setting of anticoagulation with an INR 4.29 on admit. EGD with small clean-based ulcer without active bleeding. Needs colonoscopy as next step. Colon prep today. Colonoscopy tomorrow by Dr. Randa Evens. Clear liquids today. NPO p MN.   [name] C. 04/11/2015, 9:17 AM  Pager [phone]  If no answer or after 5 PM call [phone]

## 2015-04-11 NOTE — Progress Notes (Signed)
PROGRESS NOTE  Tony Zhang ZOX:[id number] DOB: 1946/06/28 DOA: 04/08/2015 PCP: [name], MD  Assessment/Plan: Acute blood loss anemia -04/08/2015-transfused 2 units PRBC -Drop in hemoglobin partly secondary to dilution -monitor serial H/H-->remains largely stable -Baseline Hemoglobin10-11 Melena -pt had supratherapeutic INR in setting of chronic ibuprofen use -Patient was taking ibuprofen 800 mg 3 times a day for the past 20 years  -Continue Protonix drip--->defer to GI when to stop  -Appreciate GI consult  -EGD on 04/10/2015--clean based ulcer without active bleed -colonoscopy planned 6/24 -Hold Coumadin until cleared by GI to restart -Patient received vitamin K in the emergency department -hold Pletal 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) -multifactorial in the setting of NSAID and ACEi use and renal artery stenosis and hemodynamic changes due to ABLA -Renal ultrasound negative for hydronephrosis or bladder distention  -Discontinue ACE inhibitor and NSAIDs  -Continue IV fluids-->improving  -am BMP Recurrent DVT  -Patient's history is a little sketchy, but states that his last DVT was 15 years ago  -Patient states that he has had 3 DVTs, but not completely clear if he was on or off anticoagulation when he had his third DVT -hold coumadin until cleared by GI to restart Coagulopathy  -Patient had supratherapeutic INR at the time of admission--4.29 -Received vitamin K 10 g IV 1-->INR 1.48 COPD  -Stable on room air  -Patient had empyema with decortication February 2016 -Chest x-ray shows left basilar atelectasis/scarring HTN -BPs soft but improving -continue to hold bystolic and amlodipine Gouty arthritis  -Stable  -Continue allopurinol  Hyperlipidemia  -Hold the Zocor and fenofibrate until the patient is stable and able to take po Microcytic anemia -The patient's anemia is multifactorial including low serum B12 and probably iron deficiency  with iron saturation 11% and ferritin 181 B12 deficiency -Given the patient's coagulopathy and risk of bleeding, we'll supplement with po   Family Communication: Pt at beside Disposition Plan:home in 24-48 hours if stable         Procedures/Studies: Ct Chest W Contrast  03/25/2015   CLINICAL DATA:  History of pneumonia with loculated right pleural effusion, followup, also history of enlarged right hilar and paratracheal nodes, former smoking history, productive cough  EXAM: CT CHEST WITH CONTRAST  TECHNIQUE: Multidetector CT imaging of the chest was performed during intravenous contrast administration.  CONTRAST:  1 OMNIPAQUE IOHEXOL 300 MG/ML  SOLN  BUN and creatinine were obtained on site at [facility] at [address]Results: BUN 23 mg/dL, Creatinine 1.7 mg/dL.  COMPARISON:  CT chest of 11/18/2014  FINDINGS: As noted previously there is diffuse primarily centrilobular emphysema. The 7 mm nodule described previously in the right middle lobe has diminished in size and is located adjacent to the fissure, most likely a small perifissural noted which was inflammatory in nature. The 7 mm nodule just lateral to that prior nodule is no longer seen. No new or enlarging lung nodule is noted.  The previously noted loculated right pleural effusion has almost resolved with only a small opacity remaining most consistent with atelectasis. There is slightly more opacity however within the lateral aspect of the left lower lobe which may represent left pleural effusion and mild parenchymal opacity possibly representing pneumonia. Followup by clinical exam and chest x-ray is recommended. The central airway is patent.  On soft tissue window images, the thyroid gland is unremarkable. Moderate atheromatous change throughout the aortic arch and descending thoracic aorta is again noted. The pretracheal node  has diminished in size now measuring 7 mm in diameter. No enlarging adenopathy is noted.  Coronary artery calcifications again are present. The upper abdomen is unremarkable.  IMPRESSION: 1. Decrease in size of previously loculated right pleural effusion with residual small amount of fluid and atelectasis. 2. Slightly more opacity within the left lower may represent a patchy area of pneumonia with possibly a small adjacent effusion. 3. The previously noted lung nodules in the right middle lobe have either resolved or diminished in size. 4. Diffuse centrilobular emphysema.   Electronically Signed   By: Dwyane Dee M.D.   On: 03/25/2015 12:26   US Renal  04/09/2015   CLINICAL DATA:  Acute renal insufficiency.  EXAM: RENAL / URINARY TRACT ULTRASOUND COMPLETE  COMPARISON:  09/10/2013.  FINDINGS: Right Kidney:  Length: 13.2 cm. Echogenicity within normal limits. No assault mass or hydronephrosis visualized. 1.5 cm lower pole simple cyst.  Left Kidney:  Length: 11.9 cm. Echogenicity within normal limits. No solid mass or hydronephrosis visualized. 2.4 cm parapelvic cyst.  Bladder:  Appears normal for degree of bladder distention.  IMPRESSION: 1.  No acute abnormality.  No hydronephrosis or bladder distention.  2.  Simple cyst right lower pole.  Parapelvic cyst left kidney.   Electronically Signed   By: Maisie Fus  Register   On: 04/09/2015 07:41   Dg Chest Port 1 View  04/08/2015   CLINICAL DATA:  Cough, weakness, shortness of breath, diagnosed with pneumonia 2 weeks ago  EXAM: PORTABLE CHEST - 1 VIEW  COMPARISON:  CT chest dated 03/25/2015  FINDINGS: Mild patchy opacity at the left lung base is favored to reflect subpleural scarring/ atelectasis, less likely pneumonia. Suspected chronic pleural thickening without definite pleural effusion (when correlating with CT). No pneumothorax.  The heart is normal in size.  IMPRESSION: Mild patchy opacity at the left lung base, favored to reflect subpleural scarring/atelectasis, less likely pneumonia.   Electronically Signed   By: Charline Bills M.D.   On:  04/08/2015 19:56         Subjective: Patient denies fevers, chills, headache, chest pain, dyspnea, nausea, vomiting, diarrhea, abdominal pain, dysuria, hematuria   Objective: Filed Vitals:   04/10/15 1618 04/10/15 2027 04/11/15 0430 04/11/15 1728  BP:  123/60 126/60 171/83  Pulse:  75 71 77  Temp:  98.7 F (37.1 C) 98.6 F (37 C)   TempSrc:  Oral Oral   Resp:  18 18 18   Height:      Weight:      SpO2: 94% 97% 95% 100%    Intake/Output Summary (Last 24 hours) at 04/11/15 1824 Last data filed at 04/11/15 8280  Gross per 24 hour  Intake 548.33 ml  Output   2050 ml  Net -1501.67 ml   Weight change:  Exam:   General:  Pt is alert, follows commands appropriately, not in acute distress  HEENT: No icterus, No thrush, No neck mass, [place]/AT  Cardiovascular: RRR, S1/S2, no rubs, no gallops  Respiratory: CTA bilaterally, no wheezing, no crackles, no rhonchi  Abdomen: Soft/+BS, non tender, non distended, no guarding  Extremities: 1+LE edema, No lymphangitis, No petechiae, No rashes, no synovitis  Data Reviewed: Basic Metabolic Panel:  Recent Labs Lab 04/08/15 1858 04/09/15 0738 04/10/15 0634 04/11/15 0354  NA 136 138 140 139  K 3.8 3.9 3.8 4.0  CL 107 114* 117* 115*  CO2 13* 14* 15* 16*  GLUCOSE 148* 97 63* 83  BUN 114* 95* 64* 33*  CREATININE 3.42* 2.92* 1.95*  1.32*  CALCIUM 8.7* 8.6* 8.6* 8.8*   Liver Function Tests:  Recent Labs Lab 04/09/15 0738  AST 12*  ALT 9*  ALKPHOS 44  BILITOT 1.1  PROT 6.4*  ALBUMIN 2.9*   No results for input(s): LIPASE, AMYLASE in the last 168 hours. No results for input(s): AMMONIA in the last 168 hours. CBC:  Recent Labs Lab 04/10/15 0007 04/10/15 0634 04/10/15 1205 04/10/15 1825 04/11/15 0354  WBC 8.1 7.3 7.6 6.7 6.9  HGB 8.9* 8.6* 8.9* 8.7* 9.0*  HCT 27.0* 26.1* 27.0* 25.6* 27.9*  MCV 82.1 80.8 80.6 81.3 81.6  PLT 279 285 312 270 297   Cardiac Enzymes: No results for input(s): CKTOTAL, CKMB,  CKMBINDEX, TROPONINI in the last 168 hours. BNP: Invalid input(s): POCBNP CBG:  Recent Labs Lab 04/10/15 0909 04/10/15 1006  GLUCAP 59* 81    Recent Results (from the past 240 hour(s))  MRSA PCR Screening     Status: None   Collection Time: 04/09/15 12:15 AM  Result Value Ref Range Status   MRSA by PCR NEGATIVE NEGATIVE Final    Comment:        The GeneXpert MRSA Assay (FDA approved for NASAL specimens only), is one component of a comprehensive MRSA colonization surveillance program. It is not intended to diagnose MRSA infection nor to guide or monitor treatment for MRSA infections.   Urine culture     Status: None   Collection Time: 04/09/15 12:48 AM  Result Value Ref Range Status   Specimen Description URINE, RANDOM  Final   Special Requests NONE  Final   Culture NO GROWTH 1 DAY  Final   Report Status 04/10/2015 FINAL  Final     Scheduled Meds: . vitamin B-12  500 mcg Oral Daily  . dextromethorphan-guaiFENesin  1 tablet Oral BID  . [START ON 04/12/2015] pantoprazole (PROTONIX) IV  40 mg Intravenous Q12H  . polyethylene glycol-electrolytes  4,000 mL Oral Once  . sodium chloride  3 mL Intravenous Q12H   Continuous Infusions: . sodium chloride 100 mL/hr at 04/11/15 0505  . sodium chloride 10 mL/hr (04/11/15 0938)  . pantoprozole (PROTONIX) infusion 8 mg/hr (04/11/15 0607)     Tony Chisenhall, DO  [facility] Pager [phone]  If 7PM-7AM, please contact night-coverage [email or web address] Password [facility] 04/11/2015, 6:24 PM   LOS: 3 days

## 2015-04-11 NOTE — Progress Notes (Signed)
Physical Therapy Treatment Patient Details Name: Tony Zhang MRN: [id number] DOB: [birth year] Today's Date: 04/11/2015    History of Present Illness Tony Zhang is a 69 y.o. male with PMH of hypertension, hyperlipidemia, gout, recurrent DVT, remote history of PE 1980, PVD, AAA, COPD, DJD, who presents with a dark stool, shortness of breath and weakness.    PT Comments    Pt progressing towards physical therapy goals. Was able to perform transfers and ambulation with no physical assistance. During dynamic balance activity, pt appeared unsteady and close guard was provided, however pt was able to recover independently without assistance. Pt continues to refuse [facility] however it would be beneficial for balance and energy conservation. Will continue to follow.     Follow Up Recommendations  Home health PT     Equipment Recommendations  None recommended by PT (Pt refusing AD)    Recommendations for Other Services       Precautions / Restrictions Precautions Precautions: Fall Precaution Comments: pt reports that he has had several "near falls" at home since last sx but has gotten somewhere to lie down and rest each time Restrictions Weight Bearing Restrictions: No    Mobility  Bed Mobility Overal bed mobility: Modified Independent                Transfers Overall transfer level: Needs assistance Equipment used: None Transfers: Sit to/from Stand Sit to Stand: Supervision         General transfer comment: Pt appears slightly unsteady initially however recovered independently.   Ambulation/Gait Ambulation/Gait assistance: Min guard;Supervision Ambulation Distance (Feet): 200 Feet Assistive device: None Gait Pattern/deviations: Step-through pattern;Decreased stride length;Trunk flexed Gait velocity: Decreased Gait velocity interpretation: Below normal speed for age/gender General Gait Details: Min guard intially as pt was reaching for UE support when first began  walking. As pt became more confident, did not require UE support and was at more of a supervision level. Pt declined use of SPC.    Stairs            Wheelchair Mobility    Modified Rankin (Stroke Patients Only)       Balance Overall balance assessment: Needs assistance Sitting-balance support: Feet supported;No upper extremity supported Sitting balance-Leahy Scale: Normal     Standing balance support: No upper extremity supported Standing balance-Leahy Scale: Fair                      Cognition Arousal/Alertness: Awake/alert Behavior During Therapy: WFL for tasks assessed/performed Overall Cognitive Status: Within Functional Limits for tasks assessed                      Exercises General Exercises - Lower Extremity Long Arc Quad: 10 reps    General Comments        Pertinent Vitals/Pain Pain Assessment: No/denies pain    Home Living                      Prior Function            PT Goals (current goals can now be found in the care plan section) Acute Rehab PT Goals Patient Stated Goal: Return to PLOF PT Goal Formulation: With patient Time For Goal Achievement: 04/23/15 Potential to Achieve Goals: Good Progress towards PT goals: Progressing toward goals    Frequency  Min 3X/week    PT Plan Current plan remains appropriate    Co-evaluation  End of Session Equipment Utilized During Treatment: Gait belt Activity Tolerance: Patient tolerated treatment well Patient left: with call bell/phone within reach (Sitting EOB)     Time: 4782-9562 PT Time Calculation (min) (ACUTE ONLY): 20 min  Charges:  $Gait Training: 8-22 mins                    G Codes:      Conni Slipper 21-Apr-2015, 1:36 PM   Conni Slipper, PT, DPT Acute Rehabilitation Services Pager: [phone]

## 2015-04-12 ENCOUNTER — Encounter (HOSPITAL_COMMUNITY): Payer: Self-pay

## 2015-04-12 ENCOUNTER — Encounter (HOSPITAL_COMMUNITY)
Admission: EM | Disposition: A | Discharge status: Home / Self Care | Payer: Self-pay | Source: Home / Self Care | Attending: Internal Medicine

## 2015-04-12 HISTORY — PX: FLEXIBLE SIGMOIDOSCOPY: SHX5431

## 2015-04-12 LAB — CBC
HEMATOCRIT: 28.1 % — AB (ref 39.0–52.0)
Hemoglobin: 9.3 g/dL — ABNORMAL LOW (ref 13.0–17.0)
MCH: 26.5 pg (ref 26.0–34.0)
MCHC: 33.1 g/dL (ref 30.0–36.0)
MCV: 80.1 fL (ref 78.0–100.0)
Platelets: 323 10*3/uL (ref 150–400)
RBC: 3.51 MIL/uL — AB (ref 4.22–5.81)
RDW: 18.8 % — AB (ref 11.5–15.5)
WBC: 8.1 10*3/uL (ref 4.0–10.5)

## 2015-04-12 LAB — GLUCOSE, CAPILLARY
GLUCOSE-CAPILLARY: 90 mg/dL (ref 65–99)
Glucose-Capillary: 91 mg/dL (ref 65–99)

## 2015-04-12 LAB — BASIC METABOLIC PANEL
ANION GAP: 10 (ref 5–15)
BUN: 16 mg/dL (ref 6–20)
CO2: 19 mmol/L — ABNORMAL LOW (ref 22–32)
CREATININE: 0.92 mg/dL (ref 0.61–1.24)
Calcium: 8.9 mg/dL (ref 8.9–10.3)
Chloride: 109 mmol/L (ref 101–111)
GFR calc Af Amer: >60 mL/min (ref >60)
GFR calc non Af Amer: >60 mL/min (ref >60)
Glucose, Bld: 93 mg/dL (ref 65–99)
Potassium: 3.4 mmol/L — ABNORMAL LOW (ref 3.5–5.1)
Sodium: 138 mmol/L (ref 135–145)

## 2015-04-12 LAB — PROTIME-INR
INR: 1.47 (ref 0.00–1.49)
Prothrombin Time: 17.9 seconds — ABNORMAL HIGH (ref 11.6–15.2)

## 2015-04-12 SURGERY — SIGMOIDOSCOPY, FLEXIBLE
Anesthesia: Moderate Sedation

## 2015-04-12 MED ORDER — SODIUM CHLORIDE 0.9 % IV SOLN
INTRAVENOUS | Status: DC
  Administered 2015-04-12: 11:00:00 via INTRAVENOUS
  Filled 2015-04-12 (×3): qty 1000

## 2015-04-12 MED ORDER — FENTANYL CITRATE (PF) 100 MCG/2ML IJ SOLN
INTRAMUSCULAR | Status: AC
  Filled 2015-04-12: qty 4

## 2015-04-12 MED ORDER — NEBIVOLOL HCL 5 MG PO TABS
5.0000 mg | ORAL_TABLET | Freq: Every day | ORAL | Status: DC
  Administered 2015-04-12 – 2015-04-14 (×3): 5 mg via ORAL
  Filled 2015-04-12 (×3): qty 1

## 2015-04-12 MED ORDER — POTASSIUM CHLORIDE 10 MEQ/100ML IV SOLN
10.0000 meq | INTRAVENOUS | Status: AC
  Filled 2015-04-12 (×2): qty 100

## 2015-04-12 MED ORDER — MIDAZOLAM HCL 5 MG/5ML IJ SOLN
INTRAMUSCULAR | Status: DC | PRN
  Administered 2015-04-12 (×2): 2 mg via INTRAVENOUS

## 2015-04-12 MED ORDER — DIPHENHYDRAMINE HCL 50 MG/ML IJ SOLN
INTRAMUSCULAR | Status: AC
  Filled 2015-04-12: qty 1

## 2015-04-12 MED ORDER — MIDAZOLAM HCL 5 MG/ML IJ SOLN
INTRAMUSCULAR | Status: AC
  Filled 2015-04-12: qty 2

## 2015-04-12 MED ORDER — FENTANYL CITRATE (PF) 100 MCG/2ML IJ SOLN
INTRAMUSCULAR | Status: DC | PRN
  Administered 2015-04-12 (×2): 25 ug via INTRAVENOUS

## 2015-04-12 MED ORDER — PEG 3350-KCL-NA BICARB-NACL 420 G PO SOLR
4000.0000 mL | Freq: Once | ORAL | Status: AC
  Administered 2015-04-12: 4000 mL via ORAL
  Filled 2015-04-12: qty 4000

## 2015-04-12 MED ORDER — AMLODIPINE BESYLATE 2.5 MG PO TABS
2.5000 mg | ORAL_TABLET | Freq: Every day | ORAL | Status: DC
  Administered 2015-04-12 – 2015-04-13 (×2): 2.5 mg via ORAL
  Filled 2015-04-12 (×3): qty 1

## 2015-04-12 NOTE — Progress Notes (Signed)
PROGRESS NOTE  CHAMBERS Tony Zhang:[id number] DOB: 1945/12/14 DOA: 04/08/2015 PCP: [name], MD  Assessment/Plan: Acute blood loss anemia -04/08/2015-transfused 2 units PRBC -Drop in hemoglobin partly secondary to dilution -monitor serial H/H-->remains stable last 72 hours -Baseline Hemoglobin10-11 Melena -pt had supratherapeutic INR in setting of chronic ibuprofen use -Patient was taking ibuprofen 800 mg 3 times a day for the past 20 years  -Continue Protonix drip--->defer to GI when to stop  -Appreciate GI consult  -EGD on 04/10/2015--clean based ulcer without active bleed -colonoscopy planned 6/25 but pt had to be re-prepped as he still had significant stool-->plan colonoscopy 6/26 -Hold Coumadin until cleared by GI to restart -Patient received vitamin K in the emergency department -hold Pletal 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) -multifactorial in the setting of NSAID and ACEi use and renal artery stenosis and hemodynamic changes due to ABLA -Renal ultrasound negative for hydronephrosis or bladder distention  -Discontinue ACE inhibitor and NSAIDs  -Continue IV fluids-->resolved -saline lock IVF -am BMP Recurrent DVT  -Patient's history is a little sketchy, but states that his last DVT was 15 years ago  -Patient states that he has had 3 DVTs, but not completely clear if he was on or off anticoagulation when he had his third DVT -hold coumadin until cleared by GI to restart Coagulopathy  -Patient had supratherapeutic INR at the time of admission--4.29 -Received vitamin K 10 g IV 1-->INR 1.48 COPD  -Stable on room air  -Patient had empyema with decortication February 2016 -Chest x-ray shows left basilar atelectasis/scarring HTN -BPs soft but improving -continue to hold bystolic and amlodipine Gouty arthritis  -Stable  -Continue allopurinol  Hyperlipidemia  -Hold the Zocor and fenofibrate until the patient is stable and able to take  po Microcytic anemia -The patient's anemia is multifactorial including low serum B12 and probably iron deficiency with iron saturation 11% and ferritin 181 B12 deficiency -Given the patient's coagulopathy and risk of bleeding, we'll supplement with po   Family Communication: Pt at beside Disposition Plan:home in 24-48 hours if stable       Procedures/Studies: Ct Chest W Contrast  03/25/2015   CLINICAL DATA:  History of pneumonia with loculated right pleural effusion, followup, also history of enlarged right hilar and paratracheal nodes, former smoking history, productive cough  EXAM: CT CHEST WITH CONTRAST  TECHNIQUE: Multidetector CT imaging of the chest was performed during intravenous contrast administration.  CONTRAST:  1 OMNIPAQUE IOHEXOL 300 MG/ML  SOLN  BUN and creatinine were obtained on site at [facility] at [address]Results: BUN 23 mg/dL, Creatinine 1.7 mg/dL.  COMPARISON:  CT chest of 11/18/2014  FINDINGS: As noted previously there is diffuse primarily centrilobular emphysema. The 7 mm nodule described previously in the right middle lobe has diminished in size and is located adjacent to the fissure, most likely a small perifissural noted which was inflammatory in nature. The 7 mm nodule just lateral to that prior nodule is no longer seen. No new or enlarging lung nodule is noted.  The previously noted loculated right pleural effusion has almost resolved with only a small opacity remaining most consistent with atelectasis. There is slightly more opacity however within the lateral aspect of the left lower lobe which may represent left pleural effusion and mild parenchymal opacity possibly representing pneumonia. Followup by clinical exam and chest x-ray is recommended. The central airway is patent.  On soft tissue window images, the thyroid gland is unremarkable. Moderate  atheromatous change throughout the aortic arch and descending thoracic aorta is again noted. The  pretracheal node has diminished in size now measuring 7 mm in diameter. No enlarging adenopathy is noted. Coronary artery calcifications again are present. The upper abdomen is unremarkable.  IMPRESSION: 1. Decrease in size of previously loculated right pleural effusion with residual small amount of fluid and atelectasis. 2. Slightly more opacity within the left lower may represent a patchy area of pneumonia with possibly a small adjacent effusion. 3. The previously noted lung nodules in the right middle lobe have either resolved or diminished in size. 4. Diffuse centrilobular emphysema.   Electronically Signed   By: Dwyane Dee M.D.   On: 03/25/2015 12:26   US Renal  04/09/2015   CLINICAL DATA:  Acute renal insufficiency.  EXAM: RENAL / URINARY TRACT ULTRASOUND COMPLETE  COMPARISON:  09/10/2013.  FINDINGS: Right Kidney:  Length: 13.2 cm. Echogenicity within normal limits. No assault mass or hydronephrosis visualized. 1.5 cm lower pole simple cyst.  Left Kidney:  Length: 11.9 cm. Echogenicity within normal limits. No solid mass or hydronephrosis visualized. 2.4 cm parapelvic cyst.  Bladder:  Appears normal for degree of bladder distention.  IMPRESSION: 1.  No acute abnormality.  No hydronephrosis or bladder distention.  2.  Simple cyst right lower pole.  Parapelvic cyst left kidney.   Electronically Signed   By: Maisie Fus  Register   On: 04/09/2015 07:41   Dg Chest Port 1 View  04/08/2015   CLINICAL DATA:  Cough, weakness, shortness of breath, diagnosed with pneumonia 2 weeks ago  EXAM: PORTABLE CHEST - 1 VIEW  COMPARISON:  CT chest dated 03/25/2015  FINDINGS: Mild patchy opacity at the left lung base is favored to reflect subpleural scarring/ atelectasis, less likely pneumonia. Suspected chronic pleural thickening without definite pleural effusion (when correlating with CT). No pneumothorax.  The heart is normal in size.  IMPRESSION: Mild patchy opacity at the left lung base, favored to reflect subpleural  scarring/atelectasis, less likely pneumonia.   Electronically Signed   By: Charline Bills M.D.   On: 04/08/2015 19:56         Subjective:  patient having some difficulty with prepping for colonoscopy. He had an episode of nausea and vomiting with the prep. Has  Intermittent abdominal cramping with the prep. Denies any hematochezia, hematemesis, chest pain, shortness breath, fevers, chills, headache.  Objective: Filed Vitals:   04/12/15 0955 04/12/15 1000 04/12/15 1005 04/12/15 1025  BP: 139/62   187/81  Pulse: 78 79 82 78  Temp:      TempSrc:      Resp: 22 24 21 20   Height:      Weight:      SpO2: 96% 92% 89%    No intake or output data in the 24 hours ending 04/12/15 1956 Weight change:  Exam:   General:  Pt is alert, follows commands appropriately, not in acute distress  HEENT: No icterus, No thrush, No neck mass, [place]/AT  Cardiovascular: RRR, S1/S2, no rubs, no gallops  Respiratory:  At this crackles without wheezing.  Abdomen: Soft/+BS,  Epigastric tenderness without guarding, non distended, no guarding  Extremities: 1+LE edema, No lymphangitis, No petechiae, No rashes, no synovitis  Data Reviewed: Basic Metabolic Panel:  Recent Labs Lab 04/08/15 1858 04/09/15 0738 04/10/15 0634 04/11/15 0354 04/12/15 0438  NA 136 138 140 139 138  K 3.8 3.9 3.8 4.0 3.4*  CL 107 114* 117* 115* 109  CO2 13* 14* 15* 16* 19*  GLUCOSE 148* 97 63* 83 93  BUN 114* 95* 64* 33* 16  CREATININE 3.42* 2.92* 1.95* 1.32* 0.92  CALCIUM 8.7* 8.6* 8.6* 8.8* 8.9   Liver Function Tests:  Recent Labs Lab 04/09/15 0738  AST 12*  ALT 9*  ALKPHOS 44  BILITOT 1.1  PROT 6.4*  ALBUMIN 2.9*   No results for input(s): LIPASE, AMYLASE in the last 168 hours. No results for input(s): AMMONIA in the last 168 hours. CBC:  Recent Labs Lab 04/10/15 0634 04/10/15 1205 04/10/15 1825 04/11/15 0354 04/12/15 0438  WBC 7.3 7.6 6.7 6.9 8.1  HGB 8.6* 8.9* 8.7* 9.0* 9.3*  HCT 26.1* 27.0*  25.6* 27.9* 28.1*  MCV 80.8 80.6 81.3 81.6 80.1  PLT 285 312 270 297 323   Cardiac Enzymes: No results for input(s): CKTOTAL, CKMB, CKMBINDEX, TROPONINI in the last 168 hours. BNP: Invalid input(s): POCBNP CBG:  Recent Labs Lab 04/10/15 0909 04/10/15 1006 04/12/15 0610  GLUCAP 59* 81 90    Recent Results (from the past 240 hour(s))  MRSA PCR Screening     Status: None   Collection Time: 04/09/15 12:15 AM  Result Value Ref Range Status   MRSA by PCR NEGATIVE NEGATIVE Final    Comment:        The GeneXpert MRSA Assay (FDA approved for NASAL specimens only), is one component of a comprehensive MRSA colonization surveillance program. It is not intended to diagnose MRSA infection nor to guide or monitor treatment for MRSA infections.   Urine culture     Status: None   Collection Time: 04/09/15 12:48 AM  Result Value Ref Range Status   Specimen Description URINE, RANDOM  Final   Special Requests NONE  Final   Culture NO GROWTH 1 DAY  Final   Report Status 04/10/2015 FINAL  Final     Scheduled Meds: . amLODipine  2.5 mg Oral Daily  . vitamin B-12  500 mcg Oral Daily  . dextromethorphan-guaiFENesin  1 tablet Oral BID  . nebivolol  5 mg Oral Daily  . pantoprazole (PROTONIX) IV  40 mg Intravenous Q12H  . sodium chloride  3 mL Intravenous Q12H   Continuous Infusions: . sodium chloride 10 mL/hr (04/11/15 0938)     Rhylee Pucillo, DO  [facility] Pager [phone]  If 7PM-7AM, please contact night-coverage [email or web address] Password [facility] 04/12/2015, 7:56 PM   LOS: 4 days

## 2015-04-12 NOTE — Interval H&P Note (Signed)
History and Physical Interval Note:  04/12/2015 9:37 AM  Tony Zhang  has presented today for surgery, with the diagnosis of GI bleed  The various methods of treatment have been discussed with the patient and family. After consideration of risks, benefits and other options for treatment, the patient has consented to  Procedure(s): COLONOSCOPY (N/A) as a surgical intervention .  The patient's history has been reviewed, patient examined, no change in status, stable for surgery.  I have reviewed the patient's chart and labs.  Questions were answered to the patient's satisfaction.     Jahmel Flannagan JR,Kallyn Demarcus L

## 2015-04-12 NOTE — Op Note (Signed)
[facility] [address] [place], [address]   COLONOSCOPY PROCEDURE REPORT  PATIENT: Sarah, Purk  MR#: [id number] BIRTHDATE: Jan 06, 1946 , 68  yrs. old GENDER: male ENDOSCOPIST: Carman Ching, MD REFERRED BY:  [name], M.D.  Mardelle Matte, M.D. PROCEDURE DATE:  2015/04/15 PROCEDURE:   Colonoscopy, diagnostic planned but incomplete. Was only able to advance to 40 cm due to very poor prep ASA CLASS:   Class III INDICATIONS:G.I.  bleeding. MEDICATIONS: Fentanyl 50 mcg IV and Versed 4 mg IV  DESCRIPTION OF PROCEDURE:   After the risks and benefits and of the procedure were explained, informed consent was obtained.  revealed no abnormalities of the rectum.    The Pentax Ped Colon [id number] endoscope was introduced through the anus and advanced to the sigmoid colon .  The quality of the prep was inadequate . Therewas so much stool at about 40 cm in the sigmoid colon that we were unable to pass. There was no active bleeding up until this point. I felt that it would be in the patient's best interest to repeat the procedure with a better prep. The scope was withdrawn no gross bleeding was seen no lesions were seen in the rectum and sigmoid colon. The instrument was then slowly withdrawn as the colon was fully examined. Estimated blood loss is zero unless otherwise noted in this procedure report.   The scope was then withdrawn from the patient and the procedure completed.  WITHDRAWAL TIME:  COMPLICATIONS: There were no immediate complications. ENDOSCOPIprepC IMPRESSION: 1. Procedure Aborted due to inadequate prep. We were able to see to the sigmoid colon no gross lesions are gross bleeding.  RECOMMENDATIONS:   REPEAT EXAM:  cc:  _______________________________ eSignedCarman Ching, MD 04-15-2015 9:56 AM   CPT CODES: ICD CODES:  The ICD and CPT codes recommended by this software are interpretations from the data that the clinical  staff has captured with the software.  The verification of the translation of this report to the ICD and CPT codes and modifiers is the sole responsibility of the health care institution and practicing physician where this report was generated.  [employer] will not be held responsible for the validity of the ICD and CPT codes included on this report.  AMA assumes no liability for data contained or not contained herein. CPT is a Publishing rights manager of the [employer].

## 2015-04-13 ENCOUNTER — Encounter (HOSPITAL_COMMUNITY): Payer: Self-pay

## 2015-04-13 ENCOUNTER — Encounter (HOSPITAL_COMMUNITY)
Admission: EM | Disposition: A | Discharge status: Home / Self Care | Payer: Self-pay | Source: Home / Self Care | Attending: Internal Medicine

## 2015-04-13 HISTORY — PX: COLONOSCOPY: SHX5424

## 2015-04-13 LAB — CBC
HCT: 31.8 % — ABNORMAL LOW (ref 39.0–52.0)
Hemoglobin: 10.6 g/dL — ABNORMAL LOW (ref 13.0–17.0)
MCH: 26.6 pg (ref 26.0–34.0)
MCHC: 33.3 g/dL (ref 30.0–36.0)
MCV: 79.7 fL (ref 78.0–100.0)
PLATELETS: 351 10*3/uL (ref 150–400)
RBC: 3.99 MIL/uL — ABNORMAL LOW (ref 4.22–5.81)
RDW: 18.5 % — ABNORMAL HIGH (ref 11.5–15.5)
WBC: 8.2 10*3/uL (ref 4.0–10.5)

## 2015-04-13 LAB — BASIC METABOLIC PANEL
Anion gap: 9 (ref 5–15)
BUN: 11 mg/dL (ref 6–20)
CALCIUM: 9.1 mg/dL (ref 8.9–10.3)
CHLORIDE: 105 mmol/L (ref 101–111)
CO2: 21 mmol/L — ABNORMAL LOW (ref 22–32)
Creatinine, Ser: 0.93 mg/dL (ref 0.61–1.24)
GFR calc Af Amer: >60 mL/min (ref >60)
GFR calc non Af Amer: >60 mL/min (ref >60)
Glucose, Bld: 87 mg/dL (ref 65–99)
Potassium: 3 mmol/L — ABNORMAL LOW (ref 3.5–5.1)
SODIUM: 135 mmol/L (ref 135–145)

## 2015-04-13 LAB — PROTIME-INR
INR: 1.55 — ABNORMAL HIGH (ref 0.00–1.49)
PROTHROMBIN TIME: 18.6 s — AB (ref 11.6–15.2)

## 2015-04-13 LAB — GLUCOSE, CAPILLARY: Glucose-Capillary: 78 mg/dL (ref 65–99)

## 2015-04-13 SURGERY — COLONOSCOPY
Anesthesia: Moderate Sedation

## 2015-04-13 MED ORDER — CYANOCOBALAMIN 500 MCG PO TABS: 500.0000 ug | ORAL_TABLET | Freq: Every day | ORAL | Status: DC

## 2015-04-13 MED ORDER — DIPHENHYDRAMINE HCL 50 MG/ML IJ SOLN
INTRAMUSCULAR | Status: AC
  Filled 2015-04-13: qty 1

## 2015-04-13 MED ORDER — WARFARIN SODIUM 2.5 MG PO TABS
2.5000 mg | ORAL_TABLET | Freq: Once | ORAL | Status: AC
  Administered 2015-04-13: 2.5 mg via ORAL
  Filled 2015-04-13: qty 1

## 2015-04-13 MED ORDER — MIDAZOLAM HCL 5 MG/5ML IJ SOLN
INTRAMUSCULAR | Status: DC | PRN
  Administered 2015-04-13: 1 mg via INTRAVENOUS
  Administered 2015-04-13 (×2): 2 mg via INTRAVENOUS
  Administered 2015-04-13: 1 mg via INTRAVENOUS
  Administered 2015-04-13 (×2): 2 mg via INTRAVENOUS

## 2015-04-13 MED ORDER — ALLOPURINOL 300 MG PO TABS: 300.0000 mg | ORAL_TABLET | Freq: Every day | ORAL | Status: DC

## 2015-04-13 MED ORDER — ENOXAPARIN SODIUM 100 MG/ML ~~LOC~~ SOLN
1.0000 mg/kg | Freq: Two times a day (BID) | SUBCUTANEOUS | Status: DC
Start: 2015-04-13 — End: 2015-04-14
  Administered 2015-04-13 – 2015-04-14 (×2): 100 mg via SUBCUTANEOUS
  Filled 2015-04-13 (×4): qty 1

## 2015-04-13 MED ORDER — PANTOPRAZOLE SODIUM 40 MG PO TBEC
40.0000 mg | DELAYED_RELEASE_TABLET | Freq: Two times a day (BID) | ORAL | Status: DC
  Administered 2015-04-13 – 2015-04-14 (×2): 40 mg via ORAL
  Filled 2015-04-13: qty 1

## 2015-04-13 MED ORDER — FENTANYL CITRATE (PF) 100 MCG/2ML IJ SOLN
INTRAMUSCULAR | Status: DC | PRN
  Administered 2015-04-13 (×4): 25 ug via INTRAVENOUS

## 2015-04-13 MED ORDER — WARFARIN - PHARMACIST DOSING INPATIENT
Freq: Every day | Status: DC
  Administered 2015-04-13: 2.5

## 2015-04-13 MED ORDER — PANTOPRAZOLE SODIUM 40 MG PO TBEC: 40.0000 mg | DELAYED_RELEASE_TABLET | Freq: Two times a day (BID) | ORAL | Status: DC

## 2015-04-13 MED ORDER — PREDNISONE 50 MG PO TABS
50.0000 mg | ORAL_TABLET | Freq: Every day | ORAL | Status: DC
  Administered 2015-04-13 – 2015-04-14 (×2): 50 mg via ORAL
  Filled 2015-04-13 (×3): qty 1

## 2015-04-13 MED ORDER — FENTANYL CITRATE (PF) 100 MCG/2ML IJ SOLN
INTRAMUSCULAR | Status: AC
  Filled 2015-04-13: qty 2

## 2015-04-13 MED ORDER — MIDAZOLAM HCL 5 MG/ML IJ SOLN
INTRAMUSCULAR | Status: AC
Start: 2015-04-13 — End: 2015-04-13
  Filled 2015-04-13: qty 2

## 2015-04-13 MED ORDER — POTASSIUM CHLORIDE CRYS ER 20 MEQ PO TBCR
40.0000 meq | EXTENDED_RELEASE_TABLET | Freq: Four times a day (QID) | ORAL | Status: AC
  Administered 2015-04-13 (×2): 40 meq via ORAL
  Filled 2015-04-13 (×3): qty 2

## 2015-04-13 NOTE — Interval H&P Note (Signed)
History and Physical Interval Note:  04/13/2015 9:09 AM  Tony Zhang  has presented today for surgery, with the diagnosis of gi bleed  The various methods of treatment have been discussed with the patient and family. After consideration of risks, benefits and other options for treatment, the patient has consented to  Procedure(s): COLONOSCOPY (N/A) as a surgical intervention .  The patient's history has been reviewed, patient examined, no change in status, stable for surgery.  I have reviewed the patient's chart and labs.  Questions were answered to the patient's satisfaction.     Yashira Offenberger JR,Dawaun Brancato L

## 2015-04-13 NOTE — Progress Notes (Signed)
colon Negative to a sending: without bleeding. EGD had shown small duodenal ulcer. Should be okay to resume Coumadin at the regular dose and discharge with follow-up and adjustment by Dr Truett Perna,

## 2015-04-13 NOTE — Progress Notes (Signed)
PROGRESS NOTE  Tony Zhang:[id number] DOB: 23-Apr-1946 DOA: 04/08/2015 PCP: [name], MD  Assessment/Plan: Acute blood loss anemia -04/08/2015-transfused 2 units PRBC -Drop in hemoglobin partly secondary to dilution -monitor serial H/H-->remains stable last 72 hours -Baseline Hemoglobin10-11 Melena -pt had supratherapeutic INR in setting of chronic ibuprofen use -Patient was taking ibuprofen 800 mg 3 times a day for the past 20 years  -Appreciate GI consult  -EGD on 04/10/2015--clean based ulcer without active bleed  -colonoscopy 6/26--internal hemorrhoids -04/13/2015--cleared by GI to restart warfarin -Patient received vitamin K in the emergency department -hold Pletal 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) -multifactorial in the setting of NSAID and ACEi use and renal artery stenosis and hemodynamic changes due to ABLA -Renal ultrasound negative for hydronephrosis or bladder distention  -Discontinue ACE inhibitor and NSAIDs  -Continue IV fluids-->resolved -saline lock IVF -am BMP Recurrent DVT  -Patient's history is a little sketchy, but states that his last DVT was 15 years ago  -Patient states that he has had 3 DVTs, but not completely clear if he was on or off anticoagulation when he had his third DVT -04/13/2015 Restart warfarin with Lovenox bridge Coagulopathy  -Patient had supratherapeutic INR at the time of admission--4.29 -Received vitamin K 10 g IV 1-->INR 1.48 Hypokalemia  -Replete  -Check magnesium  COPD  -Stable on room air  -Patient had empyema with decortication February 2016 -Chest x-ray shows left basilar atelectasis/scarring HTN -BPs soft but improving -continue to hold bystolic and amlodipine Gouty arthritis  -Stable  -Continue allopurinol  Hyperlipidemia  -Hold the Zocor and fenofibrate until the patient is stable and able to take po Microcytic anemia -The patient's anemia is multifactorial including low serum B12  and probably iron deficiency with iron saturation 11% and ferritin 181 B12 deficiency -Given the patient's coagulopathy and risk of bleeding, we'll supplement with po Gouty arthritis flare -Hold allopurinol -Start prednisone 50 mg daily  Family Communication: Pt at beside Disposition Plan:home 04/14/15 if stable         Procedures/Studies: Ct Chest W Contrast  03/25/2015   CLINICAL DATA:  History of pneumonia with loculated right pleural effusion, followup, also history of enlarged right hilar and paratracheal nodes, former smoking history, productive cough  EXAM: CT CHEST WITH CONTRAST  TECHNIQUE: Multidetector CT imaging of the chest was performed during intravenous contrast administration.  CONTRAST:  1 OMNIPAQUE IOHEXOL 300 MG/ML  SOLN  BUN and creatinine were obtained on site at [facility] at [address]Results: BUN 23 mg/dL, Creatinine 1.7 mg/dL.  COMPARISON:  CT chest of 11/18/2014  FINDINGS: As noted previously there is diffuse primarily centrilobular emphysema. The 7 mm nodule described previously in the right middle lobe has diminished in size and is located adjacent to the fissure, most likely a small perifissural noted which was inflammatory in nature. The 7 mm nodule just lateral to that prior nodule is no longer seen. No new or enlarging lung nodule is noted.  The previously noted loculated right pleural effusion has almost resolved with only a small opacity remaining most consistent with atelectasis. There is slightly more opacity however within the lateral aspect of the left lower lobe which may represent left pleural effusion and mild parenchymal opacity possibly representing pneumonia. Followup by clinical exam and chest x-ray is recommended. The central airway is patent.  On soft tissue window images, the thyroid gland is unremarkable. Moderate atheromatous change throughout the aortic arch and descending thoracic aorta  is again noted. The pretracheal node has  diminished in size now measuring 7 mm in diameter. No enlarging adenopathy is noted. Coronary artery calcifications again are present. The upper abdomen is unremarkable.  IMPRESSION: 1. Decrease in size of previously loculated right pleural effusion with residual small amount of fluid and atelectasis. 2. Slightly more opacity within the left lower may represent a patchy area of pneumonia with possibly a small adjacent effusion. 3. The previously noted lung nodules in the right middle lobe have either resolved or diminished in size. 4. Diffuse centrilobular emphysema.   Electronically Signed   By: Dwyane Dee M.D.   On: 03/25/2015 12:26   US Renal  04/09/2015   CLINICAL DATA:  Acute renal insufficiency.  EXAM: RENAL / URINARY TRACT ULTRASOUND COMPLETE  COMPARISON:  09/10/2013.  FINDINGS: Right Kidney:  Length: 13.2 cm. Echogenicity within normal limits. No assault mass or hydronephrosis visualized. 1.5 cm lower pole simple cyst.  Left Kidney:  Length: 11.9 cm. Echogenicity within normal limits. No solid mass or hydronephrosis visualized. 2.4 cm parapelvic cyst.  Bladder:  Appears normal for degree of bladder distention.  IMPRESSION: 1.  No acute abnormality.  No hydronephrosis or bladder distention.  2.  Simple cyst right lower pole.  Parapelvic cyst left kidney.   Electronically Signed   By: Maisie Fus  Register   On: 04/09/2015 07:41   Dg Chest Port 1 View  04/08/2015   CLINICAL DATA:  Cough, weakness, shortness of breath, diagnosed with pneumonia 2 weeks ago  EXAM: PORTABLE CHEST - 1 VIEW  COMPARISON:  CT chest dated 03/25/2015  FINDINGS: Mild patchy opacity at the left lung base is favored to reflect subpleural scarring/ atelectasis, less likely pneumonia. Suspected chronic pleural thickening without definite pleural effusion (when correlating with CT). No pneumothorax.  The heart is normal in size.  IMPRESSION: Mild patchy opacity at the left lung base, favored to reflect subpleural scarring/atelectasis, less  likely pneumonia.   Electronically Signed   By: Charline Bills M.D.   On: 04/08/2015 19:56         Subjective:   Objective: Filed Vitals:   04/13/15 1145 04/13/15 1200 04/13/15 1232 04/13/15 1431  BP: 140/72 141/71 138/66 141/82  Pulse: 90 82 82 90  Temp:    98 F (36.7 C)  TempSrc:    Oral  Resp:    20  Height:      Weight:      SpO2:    98%    Intake/Output Summary (Last 24 hours) at 04/13/15 1750 Last data filed at 04/13/15 1600  Gross per 24 hour  Intake      3 ml  Output    800 ml  Net   -797 ml   Weight change:  Exam:   General:  Pt is alert, follows commands appropriately, not in acute distress  HEENT: No icterus, No thrush, No neck mass, [employer]/AT  Cardiovascular: RRR, S1/S2, no rubs, no gallops  Respiratory: CTA bilaterally, no wheezing, no crackles, no rhonchi  Abdomen: Soft/+BS, non tender, non distended, no guarding  Extremities: No edema, No lymphangitis, No petechiae, No rashes, no synovitis  Data Reviewed: Basic Metabolic Panel:  Recent Labs Lab 04/09/15 0738 04/10/15 0634 04/11/15 0354 04/12/15 0438 04/13/15 0255  NA 138 140 139 138 135  K 3.9 3.8 4.0 3.4* 3.0*  CL 114* 117* 115* 109 105  CO2 14* 15* 16* 19* 21*  GLUCOSE 97 63* 83 93 87  BUN 95* 64* 33* 16 11  CREATININE 2.92* 1.95* 1.32* 0.92 0.93  CALCIUM 8.6* 8.6* 8.8* 8.9 9.1   Liver Function Tests:  Recent Labs Lab 04/09/15 0738  AST 12*  ALT 9*  ALKPHOS 44  BILITOT 1.1  PROT 6.4*  ALBUMIN 2.9*   No results for input(s): LIPASE, AMYLASE in the last 168 hours. No results for input(s): AMMONIA in the last 168 hours. CBC:  Recent Labs Lab 04/10/15 1205 04/10/15 1825 04/11/15 0354 04/12/15 0438 04/13/15 0255  WBC 7.6 6.7 6.9 8.1 8.2  HGB 8.9* 8.7* 9.0* 9.3* 10.6*  HCT 27.0* 25.6* 27.9* 28.1* 31.8*  MCV 80.6 81.3 81.6 80.1 79.7  PLT 312 270 297 323 351   Cardiac Enzymes: No results for input(s): CKTOTAL, CKMB, CKMBINDEX, TROPONINI in the last 168  hours. BNP: Invalid input(s): POCBNP CBG:  Recent Labs Lab 04/10/15 0909 04/10/15 1006 04/12/15 0610 04/12/15 2104 04/13/15 0622  GLUCAP 59* 81 90 91 78    Recent Results (from the past 240 hour(s))  MRSA PCR Screening     Status: None   Collection Time: 04/09/15 12:15 AM  Result Value Ref Range Status   MRSA by PCR NEGATIVE NEGATIVE Final    Comment:        The GeneXpert MRSA Assay (FDA approved for NASAL specimens only), is one component of a comprehensive MRSA colonization surveillance program. It is not intended to diagnose MRSA infection nor to guide or monitor treatment for MRSA infections.   Urine culture     Status: None   Collection Time: 04/09/15 12:48 AM  Result Value Ref Range Status   Specimen Description URINE, RANDOM  Final   Special Requests NONE  Final   Culture NO GROWTH 1 DAY  Final   Report Status 04/10/2015 FINAL  Final     Scheduled Meds: . amLODipine  2.5 mg Oral Daily  . vitamin B-12  500 mcg Oral Daily  . dextromethorphan-guaiFENesin  1 tablet Oral BID  . enoxaparin (LOVENOX) injection  1 mg/kg Subcutaneous Q12H  . nebivolol  5 mg Oral Daily  . pantoprazole (PROTONIX) IV  40 mg Intravenous Q12H  . potassium chloride  40 mEq Oral Q6H  . predniSONE  50 mg Oral Q breakfast  . sodium chloride  3 mL Intravenous Q12H  . Warfarin - Pharmacist Dosing Inpatient   Does not apply q1800   Continuous Infusions: . sodium chloride 500 mL (04/13/15 0912)     Eian Vandervelden, DO  [facility] Pager [phone]  If 7PM-7AM, please contact night-coverage [email or web address] Password [email or web address] 04/13/2015, 5:50 PM   LOS: 5 days

## 2015-04-13 NOTE — Progress Notes (Signed)
ANTICOAGULATION CONSULT NOTE - Initial Consult  Pharmacy Consult for warfarin Indication: hx PE, recurrent DVT  Allergies  Allergen Reactions  . Codeine Hives and Other (See Comments)    Takes benadryl to stop reaction    Patient Measurements: Height: 6\' 1"  (185.4 cm) Weight: 219 lb 5.7 oz (99.5 kg) IBW/kg (Calculated) : 79.9 Heparin Dosing Weight:   Vital Signs: Temp: 98.1 F (36.7 C) (06/26 0630) Temp Source: Oral (06/26 0630) BP: 154/70 mmHg (06/26 1057) Pulse Rate: 80 (06/26 1020)  Labs:  Recent Labs  04/11/15 0354 04/12/15 0438 04/13/15 0255  HGB 9.0* 9.3* 10.6*  HCT 27.9* 28.1* 31.8*  PLT 297 323 351  LABPROT 17.5* 17.9* 18.6*  INR 1.43 1.47 1.55*  CREATININE 1.32* 0.92 0.93    Estimated Creatinine Clearance: 93 mL/min (by C-G formula based on Cr of 0.93).   Medical History: Past Medical History  Diagnosis Date  . Venous thrombosis     Recurrent  . Peripheral vascular disease   . Dyslipidemia   . AAA (abdominal aortic aneurysm)   . COPD (chronic obstructive pulmonary disease)   . Gout   . DJD (degenerative joint disease)   . Malignant hypertension   . Shortness of breath   . Pneumonia 08/2013    pt. reports that he is having this surgery 11/25/2014- for the lung problem that began with pneumonia in 09/2014  . 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     Assessment: 68 yom on chronic coumadin for hx PE and recurrent DVT, is to resume coumadin + therapeutic lovenox after anticoagulation had been reversed and held for possible GIB. H/H 10.6/31.8 and plts WNL and stable. No new bleeding noted. GI ok with restarting anticoagulation.   Goal of Therapy:  INR 2-3 Monitor platelets by anticoagulation protocol: Yes   Plan:  - Warfarin 2.5mg  PO x 1 tonight - Daily INR - Continue lovenox 1mg /kg Q12H per MD - F/u S&S of bleeding  Ilyas Lipsitz, Drake Leach 04/13/2015,1:46 PM

## 2015-04-13 NOTE — H&P (View-Only) (Signed)
Patient ID: Tony Zhang, male   DOB: 05/01/1946, 68 y.o.   MRN: [id number] [facility] Progress Note  Tony Zhang 68 y.o. 10/15/1946   Subjective: Feels ok. No melena.  Objective: Vital signs in last 24 hours: Filed Vitals:   04/11/15 0430  BP: 126/60  Pulse: 71  Temp: 98.6 F (37 C)  Resp: 18    Physical Exam: Gen: alert, no acute distress CV: RRR Chest: CTA anteriorly Abd: epigastric and periumbilical tenderness with guarding, soft, nondistended, +BS Ext: no edema  Lab Results:  Recent Labs  04/10/15 0634 04/11/15 0354  NA 140 139  K 3.8 4.0  CL 117* 115*  CO2 15* 16*  GLUCOSE 63* 83  BUN 64* 33*  CREATININE 1.95* 1.32*  CALCIUM 8.6* 8.8*    Recent Labs  04/09/15 0738  AST 12*  ALT 9*  ALKPHOS 44  BILITOT 1.1  PROT 6.4*  ALBUMIN 2.9*    Recent Labs  04/10/15 1825 04/11/15 0354  WBC 6.7 6.9  HGB 8.7* 9.0*  HCT 25.6* 27.9*  MCV 81.3 81.6  PLT 270 297    Recent Labs  04/10/15 0634 04/11/15 0354  LABPROT 18.0* 17.5*  INR 1.48 1.43      Assessment/Plan: 68 yo s/p GI bleed manifested as melena in the setting of anticoagulation with an INR 4.29 on admit. EGD with small clean-based ulcer without active bleeding. Needs colonoscopy as next step. Colon prep today. Colonoscopy tomorrow by Dr. Edwards. Clear liquids today. NPO p MN.   Shianne Zeiser C. 04/11/2015, 9:17 AM  Pager [phone]  If no answer or after 5 PM call [phone]  

## 2015-04-13 NOTE — Discharge Summary (Addendum)
Physician Discharge Summary  Tony Zhang:[id number] DOB: [birth year] DOA: 04/08/2015  PCP: [name], MD  Admit date: 04/08/2015 Discharge date: 04/14/15 Recommendations for Outpatient Follow-up:  1. Pt will need to follow up with PCP in 1-2 weeks post discharge 2. Please obtain BMP and CBC in one week 3. Please check INR on 04/15/2015 and adjust Coumadin dosing accordingly.  Patient will continue Lovenox bridge (100mg  bid) until INR is 2-3 4. Please check serum B12 level in 1 month Discharge Diagnoses:  Acute blood loss anemia -04/08/2015-transfused 2 units PRBC -Drop in hemoglobin partly secondary to dilution -monitor serial H/H-->remains stable last 72 hours -Baseline Hemoglobin10-11 Melena -pt had supratherapeutic INR in setting of chronic ibuprofen use -Patient was taking ibuprofen 800 mg 3 times a day for the past 20 years  -Appreciate GI consult  -EGD on 04/10/2015--clean based ulcer without active bleed  -colonoscopy 6/26--internal hemorrhoids -04/13/2015--cleared by GI to restart warfarin -Patient received vitamin K in the emergency department -hold Pletal 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) -multifactorial in the setting of NSAID and ACEi use and renal artery stenosis and hemodynamic changes due to ABLA -Renal ultrasound negative for hydronephrosis or bladder distention  -Discontinue ACE inhibitor and NSAIDs  -Continue IV fluids-->resolved -saline lock IVF -will not restart Lotensin at time of d/c Recurrent DVT  -Patient's history is a little sketchy, but states that his last DVT was 15 years ago  -Patient states that he has had 3 DVTs, but not completely clear if he was on or off anticoagulation when he had his third DVT -04/13/2015 Restart warfarin with Lovenox bridge -home with Lovenox bridge until INR 2-3 -pt will follow up at coumadin clinic at [facility] for INR check on 6/28 Coagulopathy  -Patient had supratherapeutic INR at the time of  admission--4.29 -Received vitamin K 10 g IV 1-->INR 1.48 Hypokalemia  -Repleted  -Check magnesium --1.7 COPD  -Stable on room air  -Patient had empyema with decortication February 2016 -Chest x-ray shows left basilar atelectasis/scarring HTN -BPs soft but improving -continue to hold bystolic and amlodipine -increased amlodipine to 5 mg daily -will not restart benazepril Hyperlipidemia  -Hold the Zocor and fenofibrate until the patient is stable and able to take po Microcytic anemia -The patient's anemia is multifactorial including low serum B12 and probably iron deficiency with iron saturation 11% and ferritin 181 B12 deficiency -Given the patient's coagulopathy and risk of bleeding, we'll supplement with po Gouty arthritis flare -Hold allopurinol till 04/21/15 -Start prednisone 50 mg daily x 3 more days to complete 5 days of tx  Discharge Condition: stable  Disposition:  Follow-up Information    Follow up with Zhang,Tony C., MD In 1 month.   Specialty:  Gastroenterology   Contact information:   [address] [place] [address] [phone]       Follow up with Tony Papas, MD In 1 month.   Specialty:  Oncology   Contact information:   [address] [place] [address] [phone]       Follow up with [name], MD In 1 week.   Specialty:  Family Medicine   Contact information:   [address] [name] [address] [phone]     home  Diet:heart healthy Wt Readings from Last 3 Encounters:  04/09/15 99.5 kg (219 lb 5.7 oz)  12/23/14 99.338 kg (219 lb)  11/28/14 99.5 kg (219 lb 5.7 oz)    History of present illness:  69 y.o. male with PMH  of hypertension, hyperlipidemia, gout, recurrent DVT, remote history of PE 1980, PVD, AAA, COPD, DJD, who presents with a dark stool, shortness of breath and weakness.  Patient reports that he is taking Coumadin because of remote history of PE 1980, and recurrent left leg DVT.  Since the morning of admission,, he has had 3-4 times of bowel movement with dark stool. He feels lightheaded and dizzy. He felt presyncopal. He has mild abdominal soreness. He said he stopped taking Coumadin after noticed dark stool today. Of note, Patient reports that he was hospitalized from 2/8-2/13/16 because of empyema secondary to previous pneumonia on 09/2014. He had video bronchoscopy, right video-assisted thoracoscopy/mini-thoracotomy, drainage of empyema and decortication in that admission. The patient also endorsed taking 2-3 ibuprofen daily for 20 years. The patient was transfuse 2 units PRBC. His hemoglobin remained stable after transfusion. There are no other active signs of bleeding. The patient remained hemodynamically stable. The patient's INR was 4.29. He was given vitamin K IV. GI was consulted. EGD and colonoscopy were performed. Results are noted as above. GI cleared the patient to restart Coumadin. The patient will be sent home with Coumadin with a Lovenox bridge.   Consultants: [facility]   Discharge Exam: Filed Vitals:   04/14/15 0403  BP: 154/84  Pulse: 65  Temp: 98.2 F (36.8 C)  Resp: 20   Filed Vitals:   04/13/15 1232 04/13/15 1431 04/13/15 1950 04/14/15 0403  BP: 138/66 141/82 152/75 154/84  Pulse: 82 90 93 65  Temp:  98 F (36.7 C) 98.4 F (36.9 C) 98.2 F (36.8 C)  TempSrc:  Oral Axillary Oral  Resp:  20 22 20   Height:      Weight:      SpO2:  98% 94% 94%   General: A&O x 3, NAD, pleasant, cooperative Cardiovascular: RRR, no rub, no gallop, no S3 Respiratory: CTAB, no wheeze, no rhonchi Abdomen:soft, nontender, nondistended, positive bowel sounds Extremities: No edema, No lymphangitis, no petechiae  Discharge Instructions      Discharge Instructions    Diet - low sodium heart healthy    Complete by:  As directed      Discharge instructions    Complete by:  As directed   Stop benazepril 40 mg daily Stop amlodipine 2.5mg  Start amlodipine 5 mg  daily Prednisone 50 mg once daily start 04/15/15 Lovenox 100 mg--inject two times a day until your coumadin clinic tells you to stop No more ibuprofen Restart allopurinol on 04/21/15     Increase activity slowly    Complete by:  As directed             Medication List    STOP taking these medications        benazepril 40 MG tablet  Commonly known as:  LOTENSIN     ibuprofen 800 MG tablet  Commonly known as:  ADVIL,MOTRIN      TAKE these medications        allopurinol 300 MG tablet  Commonly known as:  ZYLOPRIM  Take 1 tablet (300 mg total) by mouth daily. Start 04/21/15  Start taking on:  04/21/2015     amLODipine 5 MG tablet  Commonly known as:  NORVASC  Take 1 tablet (5 mg total) by mouth daily.     cilostazol 100 MG tablet  Commonly known as:  PLETAL  TAKE ONE TABLET BY MOUTH TWICE DAILY     cyanocobalamin 500 MCG tablet  Take 1 tablet (500 mcg total) by mouth daily.  enoxaparin 100 MG/ML injection  Commonly known as:  LOVENOX  Inject 1 mL (100 mg total) into the skin every 12 (twelve) hours.     fenofibrate 160 MG tablet  TAKE 1 TABLET BY MOUTH DAILY     HYDROcodone-acetaminophen 10-325 MG per tablet  Commonly known as:  NORCO  Take 1 tablet by mouth every 4 (four) hours as needed for severe pain.     nebivolol 5 MG tablet  Commonly known as:  BYSTOLIC  Take 1 tablet (5 mg total) by mouth daily.     pantoprazole 40 MG tablet  Commonly known as:  PROTONIX  Take 1 tablet (40 mg total) by mouth every 12 (twelve) hours.     predniSONE 50 MG tablet  Commonly known as:  DELTASONE  Take 1 tablet (50 mg total) by mouth daily with breakfast. Start 04/15/15  Start taking on:  04/15/2015     PROAIR HFA 108 (90 BASE) MCG/ACT inhaler  Generic drug:  albuterol  Inhale 3-4 puffs into the lungs every 4 (four) hours as needed for wheezing or shortness of breath.     simvastatin 40 MG tablet  Commonly known as:  ZOCOR  Take 1 tablet (40 mg total) by mouth daily at 6  PM.     warfarin 2.5 MG tablet  Commonly known as:  COUMADIN  TAKE 1 TABLET BY MOUTH DAILY OR AS DIRECTED         The results of significant diagnostics from this hospitalization (including imaging, microbiology, ancillary and laboratory) are listed below for reference.    Significant Diagnostic Studies: Ct Chest W Contrast  03/25/2015   CLINICAL DATA:  History of pneumonia with loculated right pleural effusion, followup, also history of enlarged right hilar and paratracheal nodes, former smoking history, productive cough  EXAM: CT CHEST WITH CONTRAST  TECHNIQUE: Multidetector CT imaging of the chest was performed during intravenous contrast administration.  CONTRAST:  1 OMNIPAQUE IOHEXOL 300 MG/ML  SOLN  BUN and creatinine were obtained on site at [facility] at [address]Results: BUN 23 mg/dL, Creatinine 1.7 mg/dL.  COMPARISON:  CT chest of 11/18/2014  FINDINGS: As noted previously there is diffuse primarily centrilobular emphysema. The 7 mm nodule described previously in the right middle lobe has diminished in size and is located adjacent to the fissure, most likely a small perifissural noted which was inflammatory in nature. The 7 mm nodule just lateral to that prior nodule is no longer seen. No new or enlarging lung nodule is noted.  The previously noted loculated right pleural effusion has almost resolved with only a small opacity remaining most consistent with atelectasis. There is slightly more opacity however within the lateral aspect of the left lower lobe which may represent left pleural effusion and mild parenchymal opacity possibly representing pneumonia. Followup by clinical exam and chest x-ray is recommended. The central airway is patent.  On soft tissue window images, the thyroid gland is unremarkable. Moderate atheromatous change throughout the aortic arch and descending thoracic aorta is again noted. The pretracheal node has diminished in size now measuring 7 mm in  diameter. No enlarging adenopathy is noted. Coronary artery calcifications again are present. The upper abdomen is unremarkable.  IMPRESSION: 1. Decrease in size of previously loculated right pleural effusion with residual small amount of fluid and atelectasis. 2. Slightly more opacity within the left lower may represent a patchy area of pneumonia with possibly a small adjacent effusion. 3. The previously noted lung nodules in the  right middle lobe have either resolved or diminished in size. 4. Diffuse centrilobular emphysema.   Electronically Signed   By: Dwyane Dee M.D.   On: 03/25/2015 12:26   US Renal  04/09/2015   CLINICAL DATA:  Acute renal insufficiency.  EXAM: RENAL / URINARY TRACT ULTRASOUND COMPLETE  COMPARISON:  09/10/2013.  FINDINGS: Right Kidney:  Length: 13.2 cm. Echogenicity within normal limits. No assault mass or hydronephrosis visualized. 1.5 cm lower pole simple cyst.  Left Kidney:  Length: 11.9 cm. Echogenicity within normal limits. No solid mass or hydronephrosis visualized. 2.4 cm parapelvic cyst.  Bladder:  Appears normal for degree of bladder distention.  IMPRESSION: 1.  No acute abnormality.  No hydronephrosis or bladder distention.  2.  Simple cyst right lower pole.  Parapelvic cyst left kidney.   Electronically Signed   By: Maisie Fus  Register   On: 04/09/2015 07:41   Dg Chest Port 1 View  04/08/2015   CLINICAL DATA:  Cough, weakness, shortness of breath, diagnosed with pneumonia 2 weeks ago  EXAM: PORTABLE CHEST - 1 VIEW  COMPARISON:  CT chest dated 03/25/2015  FINDINGS: Mild patchy opacity at the left lung base is favored to reflect subpleural scarring/ atelectasis, less likely pneumonia. Suspected chronic pleural thickening without definite pleural effusion (when correlating with CT). No pneumothorax.  The heart is normal in size.  IMPRESSION: Mild patchy opacity at the left lung base, favored to reflect subpleural scarring/atelectasis, less likely pneumonia.   Electronically Signed    By: Charline Bills M.D.   On: 04/08/2015 19:56     Microbiology: Recent Results (from the past 240 hour(s))  MRSA PCR Screening     Status: None   Collection Time: 04/09/15 12:15 AM  Result Value Ref Range Status   MRSA by PCR NEGATIVE NEGATIVE Final    Comment:        The GeneXpert MRSA Assay (FDA approved for NASAL specimens only), is one component of a comprehensive MRSA colonization surveillance program. It is not intended to diagnose MRSA infection nor to guide or monitor treatment for MRSA infections.   Urine culture     Status: None   Collection Time: 04/09/15 12:48 AM  Result Value Ref Range Status   Specimen Description URINE, RANDOM  Final   Special Requests NONE  Final   Culture NO GROWTH 1 DAY  Final   Report Status 04/10/2015 FINAL  Final     Labs: Basic Metabolic Panel:  Recent Labs Lab 04/10/15 0634 04/11/15 0354 04/12/15 0438 04/13/15 0255 04/14/15 0350  NA 140 139 138 135 136  K 3.8 4.0 3.4* 3.0* 4.2  CL 117* 115* 109 105 108  CO2 15* 16* 19* 21* 22  GLUCOSE 63* 83 93 87 188*  BUN 64* 33* 16 11 15   CREATININE 1.95* 1.32* 0.92 0.93 0.99  CALCIUM 8.6* 8.8* 8.9 9.1 9.1  MG  --   --   --   --  1.7   Liver Function Tests:  Recent Labs Lab 04/09/15 0738  AST 12*  ALT 9*  ALKPHOS 44  BILITOT 1.1  PROT 6.4*  ALBUMIN 2.9*   No results for input(s): LIPASE, AMYLASE in the last 168 hours. No results for input(s): AMMONIA in the last 168 hours. CBC:  Recent Labs Lab 04/10/15 1205 04/10/15 1825 04/11/15 0354 04/12/15 0438 04/13/15 0255  WBC 7.6 6.7 6.9 8.1 8.2  HGB 8.9* 8.7* 9.0* 9.3* 10.6*  HCT 27.0* 25.6* 27.9* 28.1* 31.8*  MCV 80.6 81.3 81.6 80.1  79.7  PLT 312 270 297 323 351   Cardiac Enzymes: No results for input(s): CKTOTAL, CKMB, CKMBINDEX, TROPONINI in the last 168 hours. BNP: Invalid input(s): POCBNP CBG:  Recent Labs Lab 04/10/15 1006 04/12/15 0610 04/12/15 2104 04/13/15 0622 04/14/15 0559  GLUCAP 81 90 91  78 151*    Time coordinating discharge:  Greater than 30 minutes  Signed:  Jadesola Poynter, DO [facility] Pager: [phone] 04/14/2015, 8:26 AM

## 2015-04-13 NOTE — Op Note (Signed)
[facility] [address] [place], [address]   COLONOSCOPY PROCEDURE REPORT  PATIENT: Tony Zhang, Tony Zhang  MR#: [id number] BIRTHDATE: 02-13-1946 , 68  yrs. old GENDER: male ENDOSCOPIST: Carman Ching, MD REFERRED BY:  [name], M.D.  Mardelle Matte, M.D. PROCEDURE DATE:  2015/04/21 PROCEDURE:   Colonoscopy, diagnostic ASA CLASS:   Class III INDICATIONS:G.I.  bleeding. MEDICATIONS: Fentanyl 100 mcg IV and Versed 10 mg IV  DESCRIPTION OF PROCEDURE:   After the risks and benefits and of the procedure were explained, informed consent was obtained.  revealed no abnormalities of the rectum.    The Pentax Adult Colon [id number] endoscope was introduced through the anus and advanced to the ascending colon .  The quality of the prep was fair. .  The instrument was then slowly withdrawn as the colon was fully examined. Estimated blood loss is zero unless otherwise noted in this procedure report.     COLON FINDINGS: Grossly normal colon.  No active bleeding.  No polyps masses or diverticulosis.     Retroflexed views revealed internal Grade I hemorrhoids.     The scope was then withdrawn from the patient and the procedure completed.  WITHDRAWAL TIME:  COMPLICATIONS: There were no immediate complications. ENDOSCOPIC IMPRESSION: 1.   Grossly normal colon.  No active bleeding.  No polyps masses or diverticulosis 2.   Internal hemorrhoids RECOMMENDATIONS: Routine follow-up with screening colonoscopy in 10 years. Should be okay to dischargewould go ahead and resume anticoagulation.  REPEAT EXAM:  cc:    Dr. [name], Dr. Rolm Baptise  _______________________________ eSigned:  Carman Ching, MD April 21, 2015 10:03 AM   CPT CODES: ICD CODES:  The ICD and CPT codes recommended by this software are interpretations from the data that the clinical staff has captured with the software.  The verification of the translation of this report to the ICD and  CPT codes and modifiers is the sole responsibility of the health care institution and practicing physician where this report was generated.  [employer] will not be held responsible for the validity of the ICD and CPT codes included on this report.  AMA assumes no liability for data contained or not contained herein. CPT is a Publishing rights manager of the [employer].

## 2015-04-14 ENCOUNTER — Encounter (HOSPITAL_COMMUNITY): Payer: Self-pay | Admitting: Gastroenterology

## 2015-04-14 LAB — GLUCOSE, CAPILLARY: Glucose-Capillary: 151 mg/dL — ABNORMAL HIGH (ref 65–99)

## 2015-04-14 LAB — BASIC METABOLIC PANEL
ANION GAP: 6 (ref 5–15)
BUN: 15 mg/dL (ref 6–20)
CO2: 22 mmol/L (ref 22–32)
Calcium: 9.1 mg/dL (ref 8.9–10.3)
Chloride: 108 mmol/L (ref 101–111)
Creatinine, Ser: 0.99 mg/dL (ref 0.61–1.24)
GFR calc Af Amer: >60 mL/min (ref >60)
Glucose, Bld: 188 mg/dL — ABNORMAL HIGH (ref 65–99)
Potassium: 4.2 mmol/L (ref 3.5–5.1)
SODIUM: 136 mmol/L (ref 135–145)

## 2015-04-14 LAB — MAGNESIUM: Magnesium: 1.7 mg/dL (ref 1.7–2.4)

## 2015-04-14 LAB — PROTIME-INR
INR: 1.51 — AB (ref 0.00–1.49)
PROTHROMBIN TIME: 18.3 s — AB (ref 11.6–15.2)

## 2015-04-14 MED ORDER — AMLODIPINE BESYLATE 5 MG PO TABS: 5.0000 mg | ORAL_TABLET | Freq: Every day | ORAL | Status: DC

## 2015-04-14 MED ORDER — ENOXAPARIN SODIUM 100 MG/ML ~~LOC~~ SOLN: 1.0000 mg/kg | Freq: Two times a day (BID) | SUBCUTANEOUS | Status: DC

## 2015-04-14 MED ORDER — PREDNISONE 50 MG PO TABS: 50.0000 mg | ORAL_TABLET | Freq: Every day | ORAL | Status: DC

## 2015-04-14 MED ORDER — AMLODIPINE BESYLATE 5 MG PO TABS
5.0000 mg | ORAL_TABLET | Freq: Every day | ORAL | Status: DC
  Administered 2015-04-14: 5 mg via ORAL
  Filled 2015-04-14: qty 1

## 2015-04-14 NOTE — Care Management Note (Signed)
Case Management Note Initial note started by Gae GallopAngela Cole RNCM  Patient Details  Name: Tony MinorsJerry F Camargo MRN: [id number] Date of Birth: August 27, 1946  Subjective/Objective:      Pt from home lives alone admitted with c/o  dark stool, shortness breath and weakness.      Action/Plan:    Return to home when medically stable. CM to f/u with d/c needs. Return to home when medically stable. CM to f/u with d/c needs.  Expected Discharge Date:      04/14/15            Expected Discharge Plan:  Home/Self Care  In-House Referral:     Discharge planning Services  CM Consult  Post Acute Care Choice:  Home Health Choice offered to:  Patient  DME Arranged:  Bedside commode DME Agency:  [facility]  HH Arranged:  PT, [facility]:     Status of Service:  Completed, signed off  Medicare Important Message Given:  Yes Date Medicare IM Given:  04/11/15 Medicare IM give by:  Donn PieriniKristi Ivar Domangue RN, BSN  Date Additional Medicare IM Given:    Additional Medicare Important Message give by:     If discussed at Long Length of Stay Meetings, dates discussed:    Additional Comments: Alma FriendlyDavid Taylor (brother n law, [phone]   04/14/15- pt for d/c home today- order for HH-PT placed per MD- based on PT recommendations- spoke with pt at bedside per conversation pt states that he plans to drive home and will not be homebound- politely declines home health - does request a BSC for home- order has been placed- [facility] to discharge today- no HH referral made - pt has refused at this time.   Donn PieriniWebster, Kariem Wolfson WiconsicoHall, RN- [phone] 04/14/2015, 10:18 AM

## 2015-04-14 NOTE — Care Management (Signed)
Important Message  Patient Details  Name: Tony Zhang MRN: [id number] Date of Birth: 07/03/1946   Medicare Important Message Given:  Yes-third notification given    Darrold SpanWebster, Marcos Ruelas Hall, RN 04/14/2015, 11:32 AM

## 2015-04-14 NOTE — Progress Notes (Addendum)
Occupational Therapy Treatment Patient Details Name: Tony Zhang MRN: [id number] DOB: 1946-03-19 Today's Date: 04/14/2015    History of present illness 69 y.o. male with PMH of hypertension, hyperlipidemia, gout, recurrent DVT, remote history of PE 1980, PVD, AAA, COPD, DJD, who presents with a dark stool, shortness of breath and weakness.   OT comments  Education provided in session. Spoke with pt about getting a 3 in 1 to use as a shower and notified nurse of DME recommendation.  Follow Up Recommendations  No OT follow up    Equipment Recommendations  3 in 1 bedside comode    Recommendations for Other Services      Precautions / Restrictions Precautions Precautions: Fall Precaution Comments: pt reports that he has had several "near falls" at home since last sx but has gotten somewhere to lie down and rest each time Restrictions Weight Bearing Restrictions: No       Mobility Bed Mobility               General bed mobility comments: not assessed-pt sitting EOB.  Transfers Overall transfer level: Modified independent (Independent/Modified independent)                    Balance  Supervision for ambulation-a little unsteady but no physical assist needed for balance.                                 ADL Overall ADL's : Needs assistance/impaired     Grooming: Oral care;Applying deodorant;Set up;Supervision/safety;Standing;Wash/dry face   Upper Body Bathing: Set up;Supervision/ safety;Standing Upper Body Bathing Details (indicate cue type and reason): washed armpits         Lower Body Dressing: Set up;Sit to/from stand;Supervision/safety Lower Body Dressing Details (indicate cue type and reason): donned socks and pants Toilet Transfer: Supervision/safety;Ambulation (bed; Independent/Modified independent with sit to stand from bed)       Tub/ Shower Transfer: Supervision/safety;Ambulation;Tub transfer   Functional mobility during ADLs:  Supervision/safety General ADL Comments: Educated on energy conservation techniques. Educated on safety such as sitting for most of LB ADLs and rugs/items on floor. Discussed options for shower chair. Pt agreeable towards end of session to get 3 in 1 to use. Cues for deep breathing technique.  Suggested possibly using a cane.      Vision                     Perception     Praxis      Cognition  Awake/Alert  Behavior During Therapy: WFL for tasks assessed/performed Overall Cognitive Status: No family/caregiver present to determine baseline cognitive functioning (decreased safety awareness)                       Extremity/Trunk Assessment               Exercises     Shoulder Instructions       General Comments      Pertinent Vitals/ Pain       Pain Assessment: 0-10 Pain Score: 5  Pain Location: bilateral feet  Pain Descriptors / Indicators: Sore Pain Intervention(s): Monitored during session  HR stable.  Home Living  Prior Functioning/Environment              Frequency Min 2X/week     Progress Toward Goals  OT Goals(current goals can now be found in the care plan section)  Progress towards OT goals: Progressing toward goals  Acute Rehab OT Goals Patient Stated Goal: go home OT Goal Formulation: With patient Time For Goal Achievement: 04/17/15 Potential to Achieve Goals: Good ADL Goals Pt Will Perform Grooming: with modified independence;standing Pt Will Perform Lower Body Bathing: with modified independence;sit to/from stand Pt Will Perform Lower Body Dressing: with modified independence;sit to/from stand Pt Will Transfer to Toilet: with modified independence;regular height toilet;ambulating Pt Will Perform Toileting - Clothing Manipulation and hygiene: with modified independence;sit to/from stand Pt Will Perform Tub/Shower Transfer: Tub transfer;with modified  independence;ambulating;shower seat Additional ADL Goal #1: Pt will verbalize 3 energy conservation strategies for selfcare tasks.   Plan Discharge plan remains appropriate    Co-evaluation                 End of Session Equipment Utilized During Treatment: Gait belt   Activity Tolerance Patient tolerated treatment well   Patient Left in bed;with call bell/phone within reach;with bed alarm set   Nurse Communication Other (comment) (recommending 3 in 1)        Time: 1610-9604 OT Time Calculation (min): 18 min  Charges: OT General Charges $OT Visit: 1 Procedure OT Treatments $Self Care/Home Management : 8-22 mins  Earlie Raveling OTR/L [phone] 04/14/2015, 9:25 AM

## 2015-04-14 NOTE — Care Management (Signed)
Important Message  Patient Details  Name: Tony Zhang MRN: [id number] Date of Birth: 23-Sep-1946   Medicare Important Message Given:  Yes-second notification given    Kyla Balzarine 04/14/2015, 1:01 PM

## 2015-04-14 NOTE — Progress Notes (Signed)
Physical Therapy Treatment Patient Details Name: Tony Zhang MRN: [id number] DOB: [birth year] Today's Date: 04/14/2015    History of Present Illness Tony Zhang is a 69 y.o. male with PMH of hypertension, hyperlipidemia, gout, recurrent DVT, remote history of PE 1980, PVD, AAA, COPD, DJD, who presents with a dark stool, shortness of breath and weakness.    PT Comments    Pt progressing towards physical therapy goals. Pt was able to perform transfers and ambulation with no physical assistance. Continues to decline trying [facility] for ambulation, and provides numerous reasons why he could not practice HEP during session as well as at home but agrees to try. Feel HHPT will be beneficial to maximize strength and independence at home.   Follow Up Recommendations  Home health PT     Equipment Recommendations  None recommended by PT (Pt refusing AD)    Recommendations for Other Services       Precautions / Restrictions Precautions Precautions: Fall Precaution Comments: pt reports that he has had several "near falls" at home since last sx but has gotten somewhere to lie down and rest each time Restrictions Weight Bearing Restrictions: No    Mobility  Bed Mobility Overal bed mobility: Modified Independent             General bed mobility comments: not assessed-pt sitting EOB.  Transfers Overall transfer level: Modified independent Equipment used: None Transfers: Sit to/from Stand           General transfer comment: No physical assist required.  Ambulation/Gait Ambulation/Gait assistance: Supervision Ambulation Distance (Feet): 225 Feet Assistive device: None Gait Pattern/deviations: Step-through pattern;Decreased stride length;Trunk flexed Gait velocity: Decreased Gait velocity interpretation: Below normal speed for age/gender General Gait Details: Pt appears unsteady at times however can recover independently. Does not want to try [facility], states he has walking sticks at  home if he needs to use them.    Stairs            Wheelchair Mobility    Modified Rankin (Stroke Patients Only)       Balance Overall balance assessment: Needs assistance Sitting-balance support: Feet supported;No upper extremity supported Sitting balance-Leahy Scale: Normal     Standing balance support: No upper extremity supported Standing balance-Leahy Scale: Fair                      Cognition Arousal/Alertness: Awake/alert Behavior During Therapy: WFL for tasks assessed/performed Overall Cognitive Status: Within Functional Limits for tasks assessed                      Exercises General Exercises - Lower Extremity Long Arc Quad: 10 reps    General Comments General comments (skin integrity, edema, etc.): Pt was educated on HEP progression. Pt declined practicing exercises past the first one instructed.       Pertinent Vitals/Pain Pain Assessment: Faces Pain Score: 5  Faces Pain Scale: Hurts little more Pain Location: Bilateral feet. Reports gout pain in R big toe as well. Pain Descriptors / Indicators: Grimacing;Guarding Pain Intervention(s): Limited activity within patient's tolerance;Monitored during session;Repositioned    Home Living                      Prior Function            PT Goals (current goals can now be found in the care plan section) Acute Rehab PT Goals Patient Stated Goal: Return to PLOF PT Goal Formulation: With  patient Time For Goal Achievement: 04/23/15 Potential to Achieve Goals: Good Progress towards PT goals: Progressing toward goals    Frequency  Min 3X/week    PT Plan Current plan remains appropriate    Co-evaluation             End of Session Equipment Utilized During Treatment: Gait belt Activity Tolerance: Patient tolerated treatment well Patient left: with call bell/phone within reach (Sitting EOB)     Time: [id number] PT Time Calculation (min) (ACUTE ONLY): 15  min  Charges:  $Gait Training: 8-22 mins                    G Codes:      Conni SlipperKirkman, Tregan Read 04/14/2015, 1:13 PM   Conni SlipperLaura Moo Gravley, PT, DPT Acute Rehabilitation Services Pager: [phone]

## 2015-04-14 NOTE — Progress Notes (Signed)
Utilization review completed.  

## 2015-04-14 NOTE — Discharge Instructions (Signed)
Information on my medicine - Coumadin   (Warfarin)  This medication education was reviewed with me or my healthcare representative as part of my discharge preparation.  The pharmacist that spoke with me during my hospital stay was:  Talynn Lebon C, RPH  Why was Coumadin prescribed for you? Coumadin was prescribed for you because you have a blood clot or a medical condition that can cause an increased risk of forming blood clots. Blood clots can cause serious health problems by blocking the flow of blood to the heart, lung, or brain. Coumadin can prevent harmful blood clots from forming. As a reminder your indication for Coumadin is:   Pulmonary Embolism Treatment  What test will check on my response to Coumadin? While on Coumadin (warfarin) you will need to have an INR test regularly to ensure that your dose is keeping you in the desired range. The INR (international normalized ratio) number is calculated from the result of the laboratory test called prothrombin time (PT).  If an INR APPOINTMENT HAS NOT ALREADY BEEN MADE FOR YOU please schedule an appointment to have this lab work done by your health care provider within 7 days. Your INR goal is usually a number between:  2 to 3 or your provider may give you a more narrow range like 2-2.5.  Ask your health care provider during an office visit what your goal INR is.  What  do you need to  know  About  COUMADIN? Take Coumadin (warfarin) exactly as prescribed by your healthcare provider about the same time each day.  DO NOT stop taking without talking to the doctor who prescribed the medication.  Stopping without other blood clot prevention medication to take the place of Coumadin may increase your risk of developing a new clot or stroke.  Get refills before you run out.  What do you do if you miss a dose? If you miss a dose, take it as soon as you remember on the same day then continue your regularly scheduled regimen the next day.  Do not take  two doses of Coumadin at the same time.  Important Safety Information A possible side effect of Coumadin (Warfarin) is an increased risk of bleeding. You should call your healthcare provider right away if you experience any of the following: ? Bleeding from an injury or your nose that does not stop. ? Unusual colored urine (red or dark brown) or unusual colored stools (red or black). ? Unusual bruising for unknown reasons. ? A serious fall or if you hit your head (even if there is no bleeding).  Some foods or medicines interact with Coumadin (warfarin) and might alter your response to warfarin. To help avoid this: ? Eat a balanced diet, maintaining a consistent amount of Vitamin K. ? Notify your provider about major diet changes you plan to make. ? Avoid alcohol or limit your intake to 1 drink for women and 2 drinks for men per day. (1 drink is 5 oz. wine, 12 oz. beer, or 1.5 oz. liquor.)  Make sure that ANY health care provider who prescribes medication for you knows that you are taking Coumadin (warfarin).  Also make sure the healthcare provider who is monitoring your Coumadin knows when you have started a new medication including herbals and non-prescription products.  Coumadin (Warfarin)  Major Drug Interactions  Increased Warfarin Effect Decreased Warfarin Effect  Alcohol (large quantities) Antibiotics (esp. Septra/Bactrim, Flagyl, Cipro) Amiodarone (Cordarone) Aspirin (ASA) Cimetidine (Tagamet) Megestrol (Megace) NSAIDs (ibuprofen, naproxen, etc.) Piroxicam (  Feldene) °Propafenone (Rythmol SR) °Propranolol (Inderal) °Isoniazid (INH) °Posaconazole (Noxafil) Barbiturates (Phenobarbital) °Carbamazepine (Tegretol) °Chlordiazepoxide (Librium) °Cholestyramine (Questran) °Griseofulvin °Oral Contraceptives °Rifampin °Sucralfate (Carafate) °Vitamin K  ° °Coumadin® (Warfarin) Major Herbal Interactions  °Increased Warfarin Effect Decreased Warfarin Effect  °Garlic °Ginseng °Ginkgo biloba  Coenzyme Q10 °Green tea °St. John’s wort   ° °Coumadin® (Warfarin) FOOD Interactions  °Eat a consistent number of servings per week of foods HIGH in Vitamin K °(1 serving = ½ cup)  °Collards (cooked, or boiled & drained) °Kale (cooked, or boiled & drained) °Mustard greens (cooked, or boiled & drained) °Parsley *serving size only = ¼ cup °Spinach (cooked, or boiled & drained) °Swiss chard (cooked, or boiled & drained) °Turnip greens (cooked, or boiled & drained)  °Eat a consistent number of servings per week of foods MEDIUM-HIGH in Vitamin K °(1 serving = 1 cup)  °Asparagus (cooked, or boiled & drained) °Broccoli (cooked, boiled & drained, or raw & chopped) °Brussel sprouts (cooked, or boiled & drained) *serving size only = ½ cup °Lettuce, raw (green leaf, endive, romaine) °Spinach, raw °Turnip greens, raw & chopped  ° °These websites have more information on Coumadin (warfarin):  www.coumadin.com; °www.ahrq.gov/consumer/coumadin.htm; ° ° ° °

## 2015-04-15 ENCOUNTER — Ambulatory Visit (HOSPITAL_BASED_OUTPATIENT_CLINIC_OR_DEPARTMENT_OTHER): Payer: Medicare Other | Admitting: Pharmacist

## 2015-04-15 ENCOUNTER — Other Ambulatory Visit (HOSPITAL_BASED_OUTPATIENT_CLINIC_OR_DEPARTMENT_OTHER): Payer: Medicare Other

## 2015-04-15 DIAGNOSIS — I82402 Acute embolism and thrombosis of unspecified deep veins of left lower extremity: Secondary | ICD-10-CM | POA: Diagnosis not present

## 2015-04-15 DIAGNOSIS — I82409 Acute embolism and thrombosis of unspecified deep veins of unspecified lower extremity: Secondary | ICD-10-CM

## 2015-04-15 LAB — PROTIME-INR
INR: 1.2 — ABNORMAL LOW (ref 2.00–3.50)
Protime: 14.4 Seconds — ABNORMAL HIGH (ref 10.6–13.4)

## 2015-04-15 LAB — POCT INR: INR: 1.2

## 2015-04-15 NOTE — Patient Instructions (Signed)
Continue Coumadin 2.5 mg daily and Lovenox 100 mg BID. Recheck INR on 04/18/15 at 11 am for lab and 11:15 am for coumadin clinic

## 2015-04-15 NOTE — Progress Notes (Signed)
Pt seen in clinic today DC from hospital yesterday after 6 day stay with GI bleed Ulcer confirmed and thought to be NSAID induced with increased motrin intake for pain INR was increased on admission as well 2.5 mg Coumadin given on Sunday but pt did not take on Monday He had his first dose of lovenox on Monday at DC Instructed patient to take 2.5 mg daily AND lovenox shots twice daily He states he had 14 shots (he actually called back to let us know he had 2, but we confirmed he needed to get the rest from the pharmacy as they were short on first fill)  We will see him back on Fri to confirm compliance and check INR prior to holiday weekend  Continue Coumadin 2.5 mg daily and Lovenox 100 mg BID. Recheck INR on 04/18/15 at 11 am for lab and 11:15 am for coumadin clinic

## 2015-04-18 ENCOUNTER — Ambulatory Visit (HOSPITAL_BASED_OUTPATIENT_CLINIC_OR_DEPARTMENT_OTHER): Payer: Medicare Other

## 2015-04-18 ENCOUNTER — Other Ambulatory Visit (HOSPITAL_BASED_OUTPATIENT_CLINIC_OR_DEPARTMENT_OTHER): Payer: Medicare Other

## 2015-04-18 DIAGNOSIS — I82402 Acute embolism and thrombosis of unspecified deep veins of left lower extremity: Secondary | ICD-10-CM | POA: Diagnosis not present

## 2015-04-18 DIAGNOSIS — I82409 Acute embolism and thrombosis of unspecified deep veins of unspecified lower extremity: Secondary | ICD-10-CM

## 2015-04-18 LAB — PROTIME-INR
INR: 1.2 — ABNORMAL LOW (ref 2.00–3.50)
Protime: 14.4 Seconds — ABNORMAL HIGH (ref 10.6–13.4)

## 2015-04-18 LAB — POCT INR: INR: 1.2

## 2015-04-18 NOTE — Progress Notes (Signed)
[name]' INR is 1.2 today, which is below his goal range of 2-3. He just got out of the hospital this past Tuesday (admitted with GI bleed), and he had missed last Monday's dose. He was instructed to take 2.5 mg daily, and he reports he did not miss a dose. He has also been doing his Lovenox shots twice daily per his report. He reports no new medications and/or dietary changes. No bleeding and/or unusual bruising. He states he's feeling better since getting home from the hospital.   He does report his warfarin tablets are 2.5 mg in strength, though states they are "long yellowish pills".  Most 2.5 mg tablets are round green pills, so I instructed him to bring in his warfarin tablets to his next appointment so we can confirm they are the correct tablets/strength.  Will give him a slightly increased dose today in hopes of being able to get him off of Lovenox shots. He states he has 7 shots left, one for tonight, and then enough to get him through Monday. If he still needs Lovenox bridging at his next appointment (Tuesday), he will need more Lovenox shots at that time.  Plan: Take 2 tablets (5 mg) of Coumadin tonight, then return to 2.5 mg daily. Continue Lovenox 100 mg BID injections. Return to Coumadin Clinic 04/22/15: 10:45 am for lab, and 11:00 am for Coumadin Clinic

## 2015-04-22 ENCOUNTER — Other Ambulatory Visit (HOSPITAL_BASED_OUTPATIENT_CLINIC_OR_DEPARTMENT_OTHER): Payer: Medicare Other

## 2015-04-22 ENCOUNTER — Ambulatory Visit (HOSPITAL_BASED_OUTPATIENT_CLINIC_OR_DEPARTMENT_OTHER): Payer: Medicare Other | Admitting: Pharmacist

## 2015-04-22 DIAGNOSIS — I82402 Acute embolism and thrombosis of unspecified deep veins of left lower extremity: Secondary | ICD-10-CM

## 2015-04-22 DIAGNOSIS — I82409 Acute embolism and thrombosis of unspecified deep veins of unspecified lower extremity: Secondary | ICD-10-CM

## 2015-04-22 LAB — PROTIME-INR
INR: 1.7 — AB (ref 2.00–3.50)
PROTIME: 20.4 s — AB (ref 10.6–13.4)

## 2015-04-22 LAB — POCT INR: INR: 1.7

## 2015-04-22 NOTE — Progress Notes (Signed)
Pt seen in clinic today INR=1.7 on lovenox 100mg  BID and coumadin 2.5 mg daily No changes to report since Friday Instructed patient to take 5mg  today then resume 2.5 mg daily. He will continue lovenox 100mg  BID for two more doses. RTC on 04/25/15 at 11:15am for lab and 11:30am for coumadin clinic Gave patient 4 lovenox shots.

## 2015-04-22 NOTE — Patient Instructions (Signed)
Take Coumadin 5 mg today, then return to 2.5 mg daily. Continue Lovenox 100 mg BID for two more days. Recheck INR on 04/25/15 at 11:15 am for lab and 11:30 am for coumadin clinic.

## 2015-04-25 ENCOUNTER — Other Ambulatory Visit (HOSPITAL_BASED_OUTPATIENT_CLINIC_OR_DEPARTMENT_OTHER): Payer: Medicare Other

## 2015-04-25 ENCOUNTER — Ambulatory Visit (HOSPITAL_BASED_OUTPATIENT_CLINIC_OR_DEPARTMENT_OTHER): Payer: Medicare Other | Admitting: Pharmacist

## 2015-04-25 DIAGNOSIS — I82402 Acute embolism and thrombosis of unspecified deep veins of left lower extremity: Secondary | ICD-10-CM | POA: Diagnosis present

## 2015-04-25 DIAGNOSIS — I82409 Acute embolism and thrombosis of unspecified deep veins of unspecified lower extremity: Secondary | ICD-10-CM

## 2015-04-25 LAB — PROTIME-INR
INR: 2.1 (ref 2.00–3.50)
Protime: 25.2 Seconds — ABNORMAL HIGH (ref 10.6–13.4)

## 2015-04-25 LAB — POCT INR: INR: 2.1

## 2015-04-25 NOTE — Progress Notes (Signed)
INR = 2.1   Goal 2-3 INR within goal range. Patient was recently hospitalized for GIB. MD diagnosed bleeding ulcer secondary to long-term ibuprofen use. Patient is now taking hydrocodone for chronic pain and will no longer be taking any NSAIDS. We will stop Lovenox. He will resume Coumadin 2.5 mg daily; he had been stable at that dose prior to his recent admission. We will recheck INR on 05/08/15 at 10:45 am for lab and 11:30 am for Coumadin clinic.  Cletis AthensLisa Nancy Arvin, PharmD

## 2015-05-01 ENCOUNTER — Ambulatory Visit: Payer: Medicare Other | Admitting: Cardiothoracic Surgery

## 2015-05-08 ENCOUNTER — Other Ambulatory Visit (HOSPITAL_BASED_OUTPATIENT_CLINIC_OR_DEPARTMENT_OTHER): Payer: Medicare Other

## 2015-05-08 ENCOUNTER — Ambulatory Visit (HOSPITAL_BASED_OUTPATIENT_CLINIC_OR_DEPARTMENT_OTHER): Payer: Medicare Other | Admitting: Pharmacist

## 2015-05-08 DIAGNOSIS — I82409 Acute embolism and thrombosis of unspecified deep veins of unspecified lower extremity: Secondary | ICD-10-CM

## 2015-05-08 LAB — PROTIME-INR
INR: 2.2 (ref 2.00–3.50)
Protime: 26.4 Seconds — ABNORMAL HIGH (ref 10.6–13.4)

## 2015-05-08 LAB — POCT INR: INR: 2.2

## 2015-05-08 NOTE — Patient Instructions (Signed)
Continue Coumadin 2.5 mg daily. Recheck INR on 05/28/15 at 11:15 am for lab and 11:30 am for Coumadin clinic.

## 2015-05-08 NOTE — Progress Notes (Signed)
Pt seen in clinic today. INR=2.2 on 2.5 mg daily No changes to report  Continue Coumadin 2.5 mg daily. Recheck INR on 05/28/15 at 11:15 am for lab and 11:30 am for Coumadin clinic.

## 2015-05-12 ENCOUNTER — Other Ambulatory Visit: Payer: Self-pay | Admitting: Cardiovascular Disease

## 2015-05-13 ENCOUNTER — Telehealth: Payer: Self-pay | Admitting: Cardiovascular Disease

## 2015-05-13 NOTE — Telephone Encounter (Signed)
Closed encounter °

## 2015-05-28 ENCOUNTER — Other Ambulatory Visit: Payer: Medicare Other

## 2015-05-28 ENCOUNTER — Ambulatory Visit (HOSPITAL_BASED_OUTPATIENT_CLINIC_OR_DEPARTMENT_OTHER): Payer: Medicare Other | Admitting: Pharmacist

## 2015-05-28 DIAGNOSIS — I82402 Acute embolism and thrombosis of unspecified deep veins of left lower extremity: Secondary | ICD-10-CM

## 2015-05-28 DIAGNOSIS — I82409 Acute embolism and thrombosis of unspecified deep veins of unspecified lower extremity: Secondary | ICD-10-CM

## 2015-05-28 LAB — PROTIME-INR
INR: 2.4 (ref 2.00–3.50)
Protime: 28.8 Seconds — ABNORMAL HIGH (ref 10.6–13.4)

## 2015-05-28 LAB — POCT INR: INR: 2.4

## 2015-05-28 NOTE — Patient Instructions (Signed)
Continue Coumadin 2.5mg  daily.  Recheck INR on 06/24/15;  lab at 11:00am and 11:15am.

## 2015-05-28 NOTE — Progress Notes (Addendum)
INR within goal today. No missed or extra coumadin doses. No changes in diet. Pt started taking Mucinex about 1&1/2 weeks ago. He will take for another 2 days. No interaction noted with coumadin. He did receive a "shot" for arthritis pain in his middle finger (left hand) from Dr. Fredirick Maudlin office. Pain much better. Pt is doing well without concerns or problems regarding anticoagulation. No unusual bruising. No bleeding noted. No s/s of clotting noted. Continue Coumadin 2.5mg  daily.  Recheck INR on 06/24/15;  Lab at 11:00am and 11:15am.

## 2015-06-20 ENCOUNTER — Telehealth: Payer: Self-pay | Admitting: Oncology

## 2015-06-20 NOTE — Telephone Encounter (Signed)
Per BS moved 9/19 f/u out to one month later but keep 9/19 lab. Called patient re change for new f/u 10/17 and lab only 9/19 but was not able to reach him - phone not taking incoming calls. Other appointments remain the same. Schedule mailed. Also added note to 9/6 appointments to send patient for new schedule.

## 2015-06-24 ENCOUNTER — Other Ambulatory Visit (HOSPITAL_BASED_OUTPATIENT_CLINIC_OR_DEPARTMENT_OTHER): Payer: Medicare Other

## 2015-06-24 ENCOUNTER — Ambulatory Visit (HOSPITAL_BASED_OUTPATIENT_CLINIC_OR_DEPARTMENT_OTHER): Payer: Medicare Other | Admitting: Pharmacist

## 2015-06-24 DIAGNOSIS — I82402 Acute embolism and thrombosis of unspecified deep veins of left lower extremity: Secondary | ICD-10-CM

## 2015-06-24 DIAGNOSIS — I82409 Acute embolism and thrombosis of unspecified deep veins of unspecified lower extremity: Secondary | ICD-10-CM

## 2015-06-24 LAB — PROTIME-INR
INR: 1.7 — AB (ref 2.00–3.50)
PROTIME: 20.4 s — AB (ref 10.6–13.4)

## 2015-06-24 LAB — POCT INR: INR: 1.7

## 2015-06-24 NOTE — Patient Instructions (Signed)
INR just below goal Take 5 mg tonight only (2 tabs) Continue Coumadin 2.5mg  daily.  Recheck INR with Dr. Truett Perna appointment on 10/17 at 11:15am for lab and 11:30am for Dr. Truett Perna  Dr. Truett Perna will be managing your coumadin unless you decide to go with your primary care physician for coumadin management.

## 2015-06-24 NOTE — Progress Notes (Signed)
INR just below goal today at 1.7 (goal 2-3) Pt is doing well with no complaints No missed or extra doses No diet or medication changes No unusual bleeding or bruising Will have pt take extra dose tonight Pt is aware of coumadin clinic closure. He will see Dr. Truett Perna on 10/17 and discuss plans for anticoagulation management. Pt voiced that he may have PCP manage his coumadin Plan for now:  Take 5 mg tonight only (2 tabs) Continue Coumadin 2.5mg  daily.  Recheck INR with Dr. Truett Perna appointment on 10/17 at 11:15am for lab and 11:30am for Dr. Truett Perna  Dr. Truett Perna will be managing your coumadin unless you decide to go with your primary care physician for coumadin management.  *This is last visit with coumadin clinic - pt discharged from coumadin clinic*

## 2015-06-26 ENCOUNTER — Other Ambulatory Visit: Payer: Self-pay | Admitting: Cardiovascular Disease

## 2015-06-26 NOTE — Telephone Encounter (Signed)
Rx(s) sent to pharmacy electronically. OV 10/20 

## 2015-07-01 ENCOUNTER — Telehealth: Payer: Self-pay | Admitting: Oncology

## 2015-07-01 NOTE — Telephone Encounter (Signed)
Per staff msg added lab to MD visit for 08/04/15, mailed out schedule... KJ

## 2015-07-07 ENCOUNTER — Ambulatory Visit: Payer: Medicare Other | Admitting: Oncology

## 2015-07-07 ENCOUNTER — Telehealth: Payer: Self-pay | Admitting: *Deleted

## 2015-07-07 ENCOUNTER — Other Ambulatory Visit (HOSPITAL_BASED_OUTPATIENT_CLINIC_OR_DEPARTMENT_OTHER): Payer: Medicare Other

## 2015-07-07 DIAGNOSIS — I82402 Acute embolism and thrombosis of unspecified deep veins of left lower extremity: Secondary | ICD-10-CM

## 2015-07-07 DIAGNOSIS — I82409 Acute embolism and thrombosis of unspecified deep veins of unspecified lower extremity: Secondary | ICD-10-CM

## 2015-07-07 LAB — PROTIME-INR
INR: 3.2 (ref 2.00–3.50)
Protime: 38.4 Seconds — ABNORMAL HIGH (ref 10.6–13.4)

## 2015-07-07 NOTE — Telephone Encounter (Signed)
Spoke with pt, he reports he is taking Coumadin 2.5 mg daily. Plans to switch anticoagulation management to PCP at next visit. INR reviewed by Dr. Truett Perna: Continue same dose, Check PT INR with PCP on 9/26.

## 2015-07-10 ENCOUNTER — Encounter: Payer: Self-pay | Admitting: *Deleted

## 2015-08-04 ENCOUNTER — Ambulatory Visit (HOSPITAL_BASED_OUTPATIENT_CLINIC_OR_DEPARTMENT_OTHER): Payer: Medicare Other | Admitting: Oncology

## 2015-08-04 ENCOUNTER — Other Ambulatory Visit (HOSPITAL_BASED_OUTPATIENT_CLINIC_OR_DEPARTMENT_OTHER): Payer: Medicare Other

## 2015-08-04 VITALS — BP 125/64 | HR 87 | Temp 97.5°F | Resp 19 | Ht 73.0 in | Wt 226.8 lb

## 2015-08-04 DIAGNOSIS — I701 Atherosclerosis of renal artery: Secondary | ICD-10-CM

## 2015-08-04 DIAGNOSIS — I82402 Acute embolism and thrombosis of unspecified deep veins of left lower extremity: Secondary | ICD-10-CM | POA: Diagnosis present

## 2015-08-04 DIAGNOSIS — I824Z9 Acute embolism and thrombosis of unspecified deep veins of unspecified distal lower extremity: Secondary | ICD-10-CM

## 2015-08-04 DIAGNOSIS — I82409 Acute embolism and thrombosis of unspecified deep veins of unspecified lower extremity: Secondary | ICD-10-CM

## 2015-08-04 LAB — PROTIME-INR
INR: 3.5 (ref 2.00–3.50)
Protime: 42 s — ABNORMAL HIGH (ref 10.6–13.4)

## 2015-08-04 NOTE — Progress Notes (Signed)
  [facility] OFFICE PROGRESS NOTE   Diagnosis: Hypercoagulation syndrome  INTERVAL HISTORY:   Mr. Melvyn NethLewis returns as scheduled. He continues Coumadin anticoagulation. He was admitted with GI bleeding in June. He underwent a GI evaluation and was found to have an ulcer without active bleeding. He was admitted in February with an empyema and underwent drainage and partial decortication 11/25/2014. He reports continued discomfort at the right chest wall.  No symptom of recurrent venous thrombosis.  Objective:  Vital signs in last 24 hours:  Blood pressure 125/64, pulse 87, temperature 97.5 F (36.4 C), temperature source Oral, resp. rate 19, height 6\' 1"  (1.854 m), weight 226 lb 12.8 oz (102.876 kg), SpO2 97 %.    Resp: Lungs clear bilaterally Cardio: Regular rate and rhythm GI: No hepatosplenomegaly Vascular: The left lower leg is slightly larger than the right side   Lab Results:  PT-INR 3.5  Medications: I have reviewed the patient's current medications.  Assessment/Plan: 1. History of recurrent venous thrombosis. He is maintained on indefinite Coumadin anticoagulation. 2. Hypertension.  3. Peripheral vascular disease.  4. Admission with GI bleeding June 2016-supratherapeutic INR, small duodenal ulcer without bleeding 5. Surgical drainage of empyema February 2016   Disposition:  Mr. Melvyn NethLewis appears stable. He will continue Coumadin anticoagulation. [facility]. He plans to follow-up with Dr. Clarene DukeLittle for management of Coumadin anticoagulation beginning next month. We did not change the Coumadin dose today. He will return for a PT/INR in 2 weeks. He is not scheduled for a follow-up appointment in the Hematology clinic. I am available to see him in the future as needed.  Thornton PapasSHERRILL, Marguriete Wootan, MD  08/04/2015  2:00 PM

## 2015-08-05 ENCOUNTER — Telehealth: Payer: Self-pay | Admitting: Oncology

## 2015-08-05 NOTE — Telephone Encounter (Signed)
s.w. pt and advised on OCT appt....pt ok and aware °

## 2015-08-07 ENCOUNTER — Encounter: Payer: Self-pay | Admitting: Cardiovascular Disease

## 2015-08-07 ENCOUNTER — Ambulatory Visit (INDEPENDENT_AMBULATORY_CARE_PROVIDER_SITE_OTHER): Payer: Medicare Other | Admitting: Cardiovascular Disease

## 2015-08-07 VITALS — BP 110/70 | HR 88 | Ht 73.0 in | Wt 229.0 lb

## 2015-08-07 DIAGNOSIS — Z5181 Encounter for therapeutic drug level monitoring: Secondary | ICD-10-CM

## 2015-08-07 DIAGNOSIS — I82402 Acute embolism and thrombosis of unspecified deep veins of left lower extremity: Secondary | ICD-10-CM | POA: Diagnosis not present

## 2015-08-07 DIAGNOSIS — I739 Peripheral vascular disease, unspecified: Secondary | ICD-10-CM | POA: Diagnosis not present

## 2015-08-07 DIAGNOSIS — Z7901 Long term (current) use of anticoagulants: Secondary | ICD-10-CM

## 2015-08-07 DIAGNOSIS — E785 Hyperlipidemia, unspecified: Secondary | ICD-10-CM

## 2015-08-07 DIAGNOSIS — G4733 Obstructive sleep apnea (adult) (pediatric): Secondary | ICD-10-CM

## 2015-08-07 DIAGNOSIS — I714 Abdominal aortic aneurysm, without rupture, unspecified: Secondary | ICD-10-CM

## 2015-08-07 DIAGNOSIS — I701 Atherosclerosis of renal artery: Secondary | ICD-10-CM | POA: Diagnosis not present

## 2015-08-07 MED ORDER — NEBIVOLOL HCL 10 MG PO TABS: 10.0000 mg | ORAL_TABLET | Freq: Every day | ORAL | Status: DC

## 2015-08-07 NOTE — Patient Instructions (Signed)
Your physician has recommended you make the following change in your medication: DECREASE BYSTOLIC TO 10MG  DAILY  Dr. Royann Shiversroitoru recommends that you schedule a follow-up appointment in: 6 MONTHS

## 2015-08-07 NOTE — Progress Notes (Signed)
Patient ID: Tony Zhang, male   DOB: Nov 15, 1945, 69 y.o.   MRN: [id number]     Cardiology Office Note   Date:  08/09/2015   ID:  Gerson, Fauth [birth year], MRN [id number]  PCP:  [name], MD  Cardiologist:   Thurmon Fair, MD   Chief Complaint  Patient presents with  . 9 MONTHS      History of Present Illness: Tony Zhang is a 69 y.o. male who presents for PAD, HTN, AAA and terminal aortic occlusion, hyperlipidemia  He is feeling better after surgical evacuation of a empyema of the right chest by Dr. Tyrone Sage in the spring. In June he was hospitalized with  GI bleeding in the setting of excessive anticoagulation and NSAID daily use, and required 2 units of PRBC transfusion. He had transient acute kidney injury.  Tony Zhang has recently had some problems with low blood pressure and dizziness.  He is inactive, but denies intermittent claudication. He has not had problems with abdominal pain or intestinal angina. He denies exertional chest pain or dyspnea.  He has severe PAD. He has a small abdominal aortic aneurysm in the infrarenal area and then the aorta is totally occluded above the iliac bifurcation. He has bilateral severely reduced ABIs of 0.7-0.8. There is collateral flow to the lower extremities only. He is suspected to have coronary disease, possible occlusion of the right coronary artery as per findings of previous nuclear stress testing. Since he is free of angina and has preserved left ventricular systolic function this is being managed medically.  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 Venous thrombosis     Recurrent  . Peripheral vascular disease (HCC)   . Dyslipidemia   . AAA (abdominal aortic aneurysm) (HCC)   . COPD (chronic obstructive pulmonary disease) (HCC)   . Gout   . DJD (degenerative joint disease)   . Malignant hypertension   . Shortness of breath    . Pneumonia 08/2013    pt. reports that he is having this surgery 11/25/2014- for the lung problem that began with pneumonia in 09/2014  . 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      Past Surgical History  Procedure Laterality Date  . Back surgery    . Hernia repair    . Appendectomy    . Shoulder surgery Right   . Eye surgery      both eyes, cataracts removed, denies lens implants  . Video bronchoscopy N/A 11/25/2014    Procedure: VIDEO BRONCHOSCOPY;  Surgeon: Delight Ovens, MD;  Location: [facility] OR;  Service: Thoracic;  Laterality: N/A;  . Video assisted thoracoscopy Right 11/25/2014    Procedure: VIDEO ASSISTED THORACOSCOPY;  Surgeon: Delight Ovens, MD;  Location: [facility] OR;  Service: Thoracic;  Laterality: Right;  . Empyema drainage N/A 11/25/2014    Procedure: EMPYEMA DRAINAGE;  Surgeon: Delight Ovens, MD;  Location: [facility] OR;  Service: Thoracic;  Laterality: N/A;  . Esophagogastroduodenoscopy N/A 04/10/2015    Procedure: ESOPHAGOGASTRODUODENOSCOPY (EGD);  Surgeon: Charlott Rakes, MD;  Location: [facility] ENDOSCOPY;  Service: Endoscopy;  Laterality: N/A;  . Flexible sigmoidoscopy N/A 04/12/2015    Procedure: FLEXIBLE SIGMOIDOSCOPY;  Surgeon: Carman Ching, MD;  Location: [facility] ENDOSCOPY;  Service: Endoscopy;  Laterality: N/A;  . Colonoscopy N/A 04/13/2015    Procedure:  COLONOSCOPY;  Surgeon: Carman Ching, MD;  Location: [facility] ENDOSCOPY;  Service: Endoscopy;  Laterality: N/A;     Current Outpatient Prescriptions  Medication Sig Dispense Refill  . allopurinol (ZYLOPRIM) 300 MG tablet Take 1 tablet (300 mg total) by mouth daily. Start 04/21/15 30 tablet 0  . cilostazol (PLETAL) 100 MG tablet TAKE ONE TABLET BY MOUTH TWICE DAILY 180 tablet 2  . fenofibrate 160 MG tablet TAKE 1 TABLET BY MOUTH DAILY 30 tablet 10  . HYDROcodone-acetaminophen (NORCO) 10-325 MG per tablet Take 1 tablet by mouth every 4 (four) hours as needed for severe  pain. 30 tablet 0  . losartan-hydrochlorothiazide (HYZAAR) 100-25 MG per tablet TAKE 1 TABLET BY MOUTH DAILY 90 tablet 0  . nebivolol (BYSTOLIC) 10 MG tablet Take 1 tablet (10 mg total) by mouth daily. 30 tablet 11  . pantoprazole (PROTONIX) 40 MG tablet Take 1 tablet (40 mg total) by mouth every 12 (twelve) hours. 60 tablet 0  . PROAIR HFA 108 (90 BASE) MCG/ACT inhaler Inhale 3-4 puffs into the lungs every 4 (four) hours as needed for wheezing or shortness of breath.     . simvastatin (ZOCOR) 40 MG tablet Take 1 tablet (40 mg total) by mouth daily at 6 PM. 30 tablet 11  . warfarin (COUMADIN) 2.5 MG tablet TAKE 1 TABLET BY MOUTH DAILY OR AS DIRECTED (Patient taking differently: Take 2.5 mg by mouth daily. ) 30 tablet 4   No current facility-administered medications for this visit.    Allergies:   Codeine    Social History:  The patient  reports that he quit smoking about 22 years ago. He has never used smokeless tobacco. He reports that he does not drink alcohol or use illicit drugs.    ROS:  Please see the history of present illness.    Otherwise, review of systems positive for none.   All other systems are reviewed and negative.    PHYSICAL EXAM: VS:  BP 110/70 mmHg  Pulse 88  Ht  (1.854 m)  Wt 229 lb (103.874 kg)  BMI 30.22 kg/m2 , BMI Body mass index is 30.22 kg/(m^2).  General: Alert, oriented x3, no distress Head: no evidence of trauma, PERRL, EOMI, no exophtalmos or lid lag, no myxedema, no xanthelasma; normal ears, nose and oropharynx Neck: normal jugular venous pulsations and no hepatojugular reflux; brisk carotid pulses without delay and no carotid bruits Chest: Emphysematous changes, reduce breath sounds in the right lung base, dullness to percussion and egophony in the same area, splinting during deep breathing  Cardiovascular: normal position and quality of the apical impulse, regular rhythm, normal first and second heart sounds, no murmurs, rubs or  gallops Abdomen: no tenderness or distention, no masses by palpation, no abnormal pulsatility or arterial bruits, normal bowel sounds, no hepatosplenomegaly Extremities: no clubbing, cyanosis or edema; 2+ radial, ulnar and brachial pulses bilaterally; 1+ right femoral, nonpalpable posterior tibial and dorsalis pedis pulses; 1+ left femoral, nonpalpable posterior tibial and dorsalis pedis pulses; no subclavian or femoral bruits Neurological: grossly nonfocal Psych: euthymic mood, full affect   EKG:  EKG is ordered today. The ekg ordered today demonstrates  Normal sinus rhythm with left axis deviation due to left anterior fascicular block, QTC 447   Recent Labs: 04/08/2015: B Natriuretic Peptide 187.8* 04/09/2015: ALT 9* 04/13/2015: Hemoglobin 10.6*; Platelets 351 04/14/2015: BUN 15; Creatinine, Ser 0.99; Magnesium 1.7; Potassium 4.2; Sodium 136    Lipid Panel    Component Value Date/Time   CHOL 153 09/17/2013  1135   TRIG 298* 09/17/2013 1135   HDL 18* 09/17/2013 1135   CHOLHDL 8.5 09/17/2013 1135   VLDL 60* 09/17/2013 1135   LDLCALC 75 09/17/2013 1135      Wt Readings from Last 3 Encounters:  08/07/15 229 lb (103.874 kg)  08/04/15 226 lb 12.8 oz (102.876 kg)  04/09/15 219 lb 5.7 oz (99.5 kg)      Other studies Reviewed:  most recent labs from Dr. Aida PufferJames Little from August 4  Hemoglobin A1c 5.5%  Total cholesterol 158, triglycerides 284, HDL 24, LDL 77  hemoglobin 13.7, creatinine 1.4   ASSESSMENT AND PLAN:  1.  Malignant hypertension with improved control, reduce bystolic to 10 mg once daily only 2.  PAD with occlusion of the terminal aorta but without lifestyle limiting claudication 3.  Hyperlipidemia with a good LDL cholesterol but with persistently low HDL and elevated triglycerides, despite the absence of hyperglycemia and treatment with fenofibrate combination with the statin 4.  Chronic warfarin anticoagulation , followed in the hematology clinic  5.  Status post  surgical evacuation of empyema of the right hemithorax 6.  Presumed CAD with evidence of inferior wall scar by nuclear perfusion testing , managed medically 7.  Chronic kidney disease stage III, creatinine at baseline 8.  Chronic bilateral renal artery stenosis, moderate     Current medicines are reviewed at length with the patient today.  The patient does not have concerns regarding medicines.  The following changes have been made:  Reduce bystolic to 10 mg once daily  Labs/ tests ordered today include:  Orders Placed This Encounter  Procedures  . EKG 12-Lead    Patient Instructions  Your physician has recommended you make the following change in your medication: DECREASE BYSTOLIC TO 10MG  DAILY  Dr. Royann Shiversroitoru recommends that you schedule a follow-up appointment in: 6 MONTHS       Signed, Cyera Balboni, MD  08/09/2015 9:11 PM    Thurmon FairMihai Shatavia Santor, MD, [facility] [phone] office [phone] pager

## 2015-08-14 ENCOUNTER — Other Ambulatory Visit: Payer: Medicare Other

## 2015-08-14 ENCOUNTER — Telehealth: Payer: Self-pay | Admitting: *Deleted

## 2015-08-14 ENCOUNTER — Other Ambulatory Visit: Payer: Self-pay | Admitting: *Deleted

## 2015-08-14 ENCOUNTER — Telehealth: Payer: Self-pay | Admitting: Oncology

## 2015-08-14 ENCOUNTER — Other Ambulatory Visit (HOSPITAL_BASED_OUTPATIENT_CLINIC_OR_DEPARTMENT_OTHER): Payer: Medicare Other

## 2015-08-14 DIAGNOSIS — I82402 Acute embolism and thrombosis of unspecified deep veins of left lower extremity: Secondary | ICD-10-CM

## 2015-08-14 DIAGNOSIS — Z7901 Long term (current) use of anticoagulants: Secondary | ICD-10-CM

## 2015-08-14 DIAGNOSIS — I824Z9 Acute embolism and thrombosis of unspecified deep veins of unspecified distal lower extremity: Secondary | ICD-10-CM

## 2015-08-14 LAB — PROTIME-INR
INR: 3.8 — AB (ref 2.00–3.50)
PROTIME: 45.6 s — AB (ref 10.6–13.4)

## 2015-08-14 MED ORDER — WARFARIN SODIUM 2 MG PO TABS: 2.0000 mg | ORAL_TABLET | Freq: Every day | ORAL | Status: DC

## 2015-08-14 NOTE — Telephone Encounter (Signed)
Per Dr. [name]; confirmed with pt that he take 2.5mg  Coumadin daily and has NOT transferred his Coumadin monitoring to his PCP yet.  Pt has appt 09/09/15 with PCP to discuss.  Per Dr. [name]; instructed pt to hold Coumadin today and start 2 mg dose daily; [facility] will re-check in 2 weeks. Pt verbalized understanding of instructions and expressed appreciation for call.

## 2015-08-14 NOTE — Telephone Encounter (Signed)
Gave and printed appt sched and avs fo rpt; for NOV  °

## 2015-08-18 ENCOUNTER — Other Ambulatory Visit: Payer: Self-pay | Admitting: Cardiovascular Disease

## 2015-08-26 ENCOUNTER — Other Ambulatory Visit (HOSPITAL_BASED_OUTPATIENT_CLINIC_OR_DEPARTMENT_OTHER): Payer: Medicare Other

## 2015-08-26 DIAGNOSIS — I82402 Acute embolism and thrombosis of unspecified deep veins of left lower extremity: Secondary | ICD-10-CM

## 2015-08-26 DIAGNOSIS — Z7901 Long term (current) use of anticoagulants: Secondary | ICD-10-CM

## 2015-08-26 LAB — PROTIME-INR
INR: 1.7 — AB (ref 2.00–3.50)
Protime: 20.4 Seconds — ABNORMAL HIGH (ref 10.6–13.4)

## 2015-08-28 ENCOUNTER — Telehealth: Payer: Self-pay | Admitting: *Deleted

## 2015-08-28 NOTE — Telephone Encounter (Signed)
-----   Message from Ladene ArtistGary B Sherrill, MD sent at 08/27/2015  5:54 PM EST ----- Please call patient, continue coumadin 2mg  daily , 2.5 mg m,w,f  Repeat here 2 weeks or with primary Md Copy plan to primary md

## 2015-08-28 NOTE — Telephone Encounter (Signed)
Per Dr. [name]; notified pt to continue coumadin 2mg  daily, 2.5mg  m,w,f.  Asked pt if he wanted [facility] or did he want to f/u with PCP.  Pt states he did NOT want to set up lab appt here "I will call my PCP for them to follow from now on"  Notified him MD recommends repeating lab in 2 weeks.  Pt verbalized understanding of information and expressed appreciation for call.

## 2015-10-13 ENCOUNTER — Other Ambulatory Visit: Payer: Self-pay | Admitting: Cardiovascular Disease

## 2015-10-14 NOTE — Telephone Encounter (Signed)
Rx request sent to pharmacy.  

## 2015-11-05 IMAGING — US US RENAL
1 series · 14 of 25 positions shown · non-contrast
Comparison: 09/10/2013.

CLINICAL DATA: Acute renal insufficiency.

EXAM:
RENAL / URINARY TRACT ULTRASOUND COMPLETE

[Series 1: us renal · 0.22mm/px · 14 of 32 slices shown]
[im 1/32]
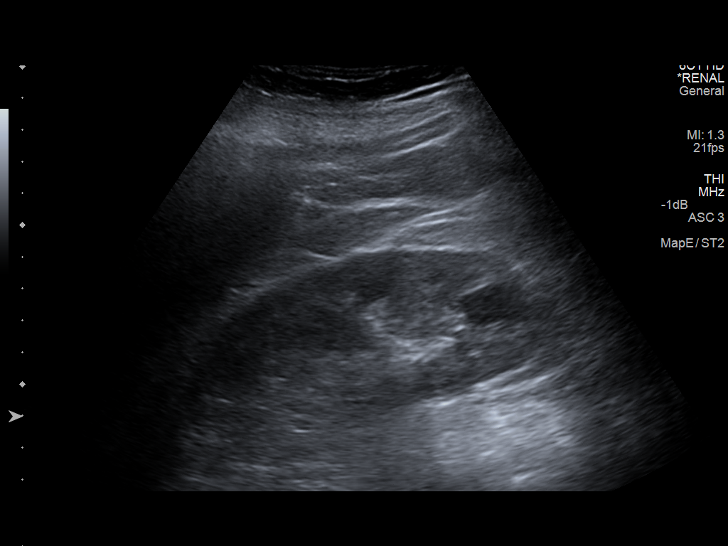
[im 3/32]
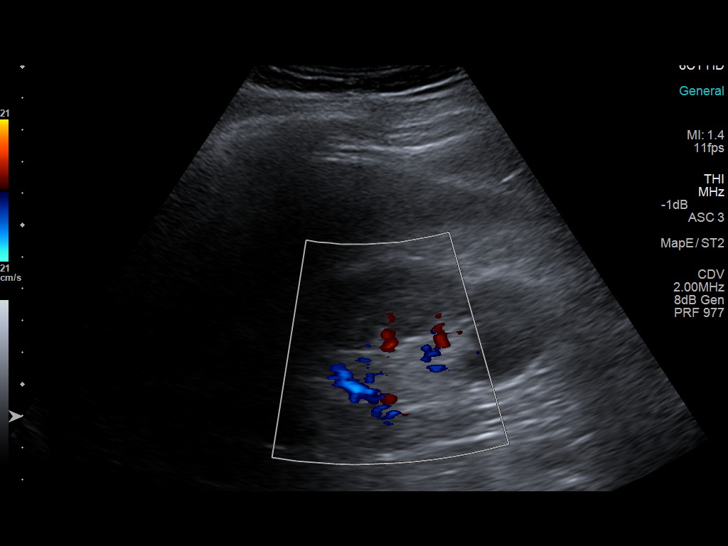
[im 6/32]
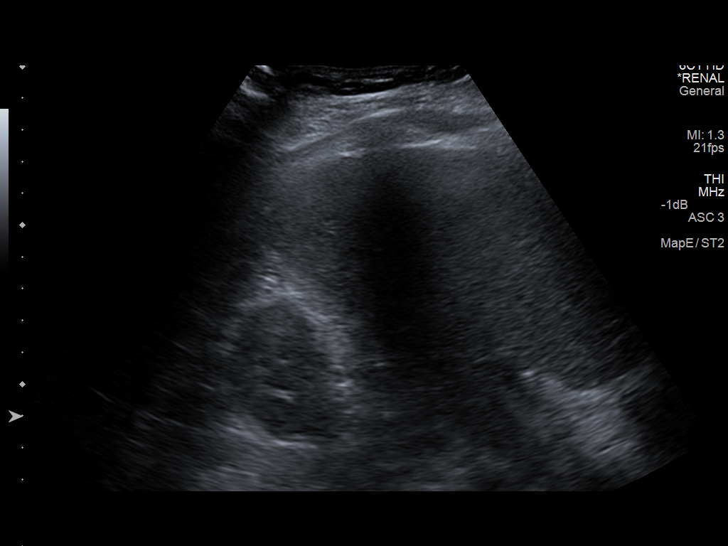
[im 8/32]
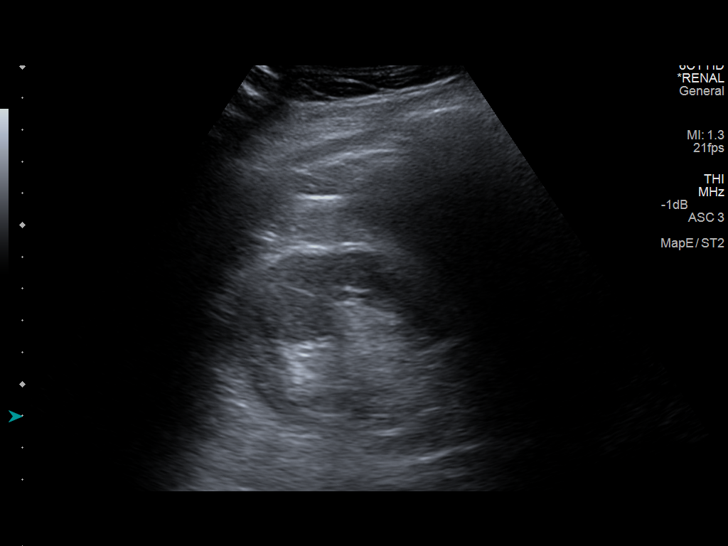
[im 11/32]
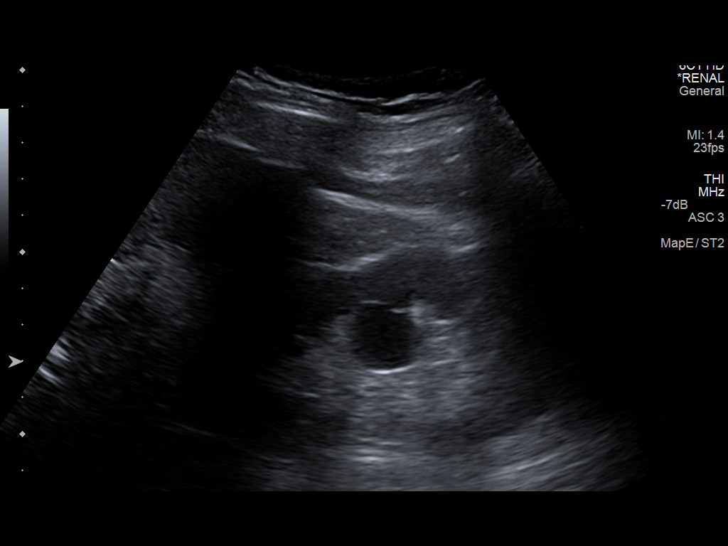
[im 12/32]
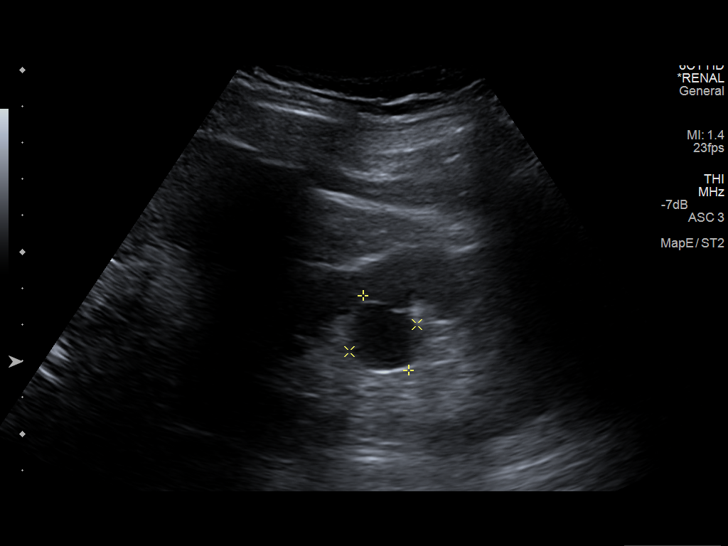
[im 15/32]
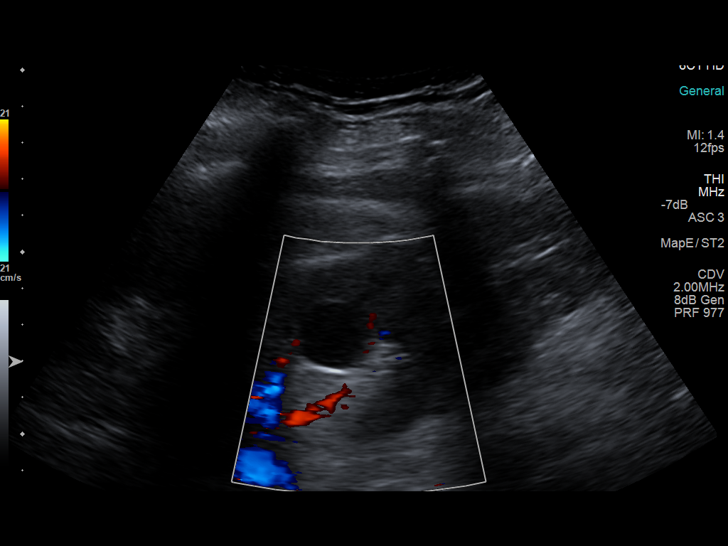
[im 17/32]
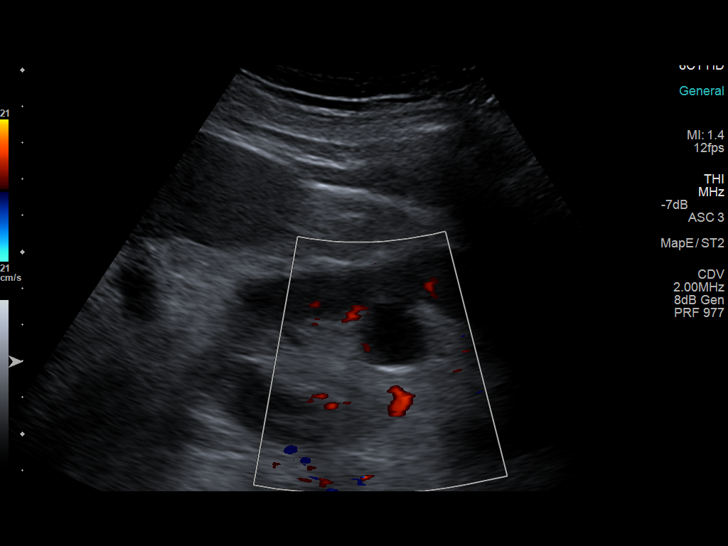
[im 20/32]
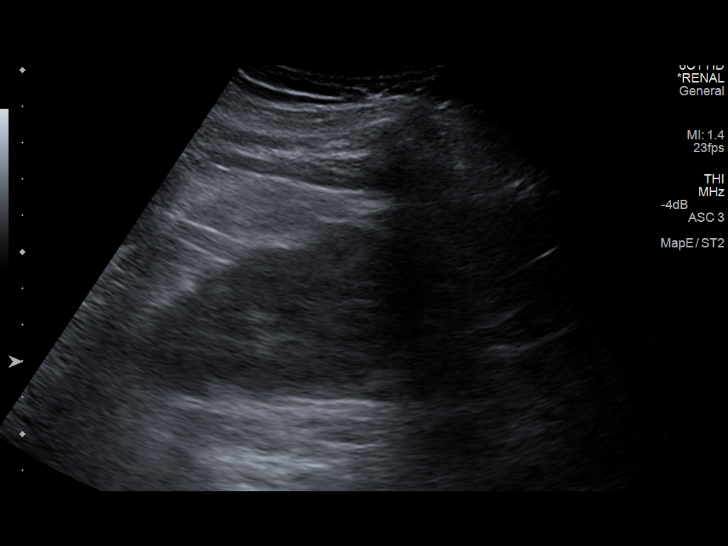
[im 21/32]
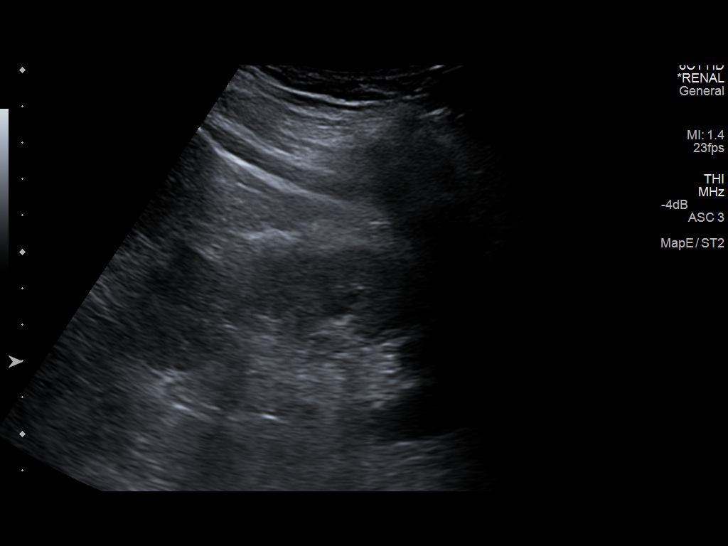
[im 24/32]
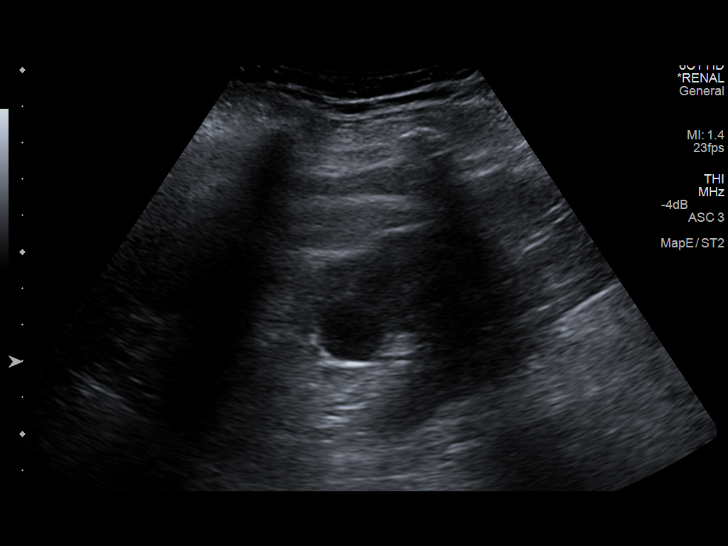
[im 26/32]
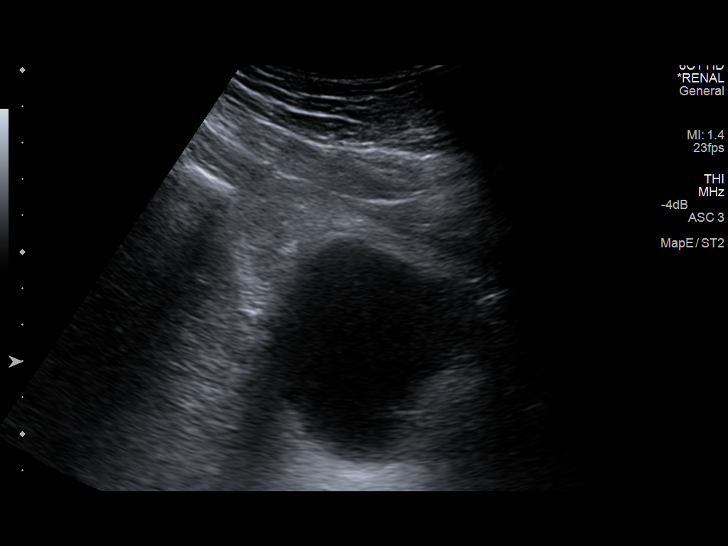
[im 29/32]
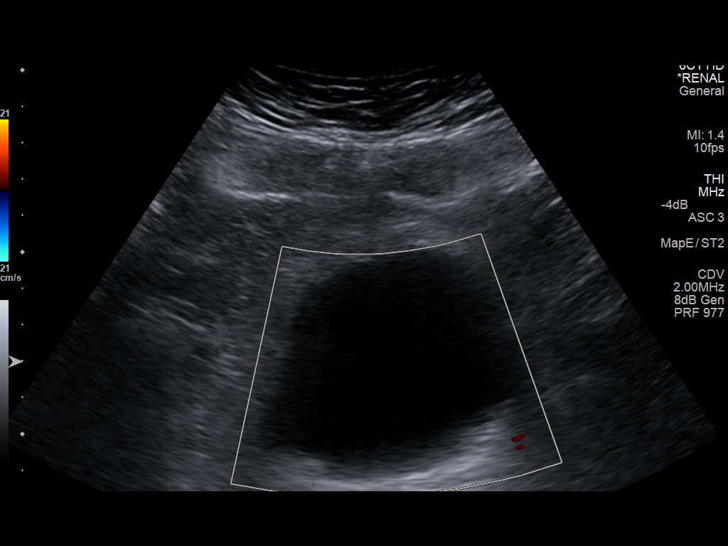
[im 32/32]
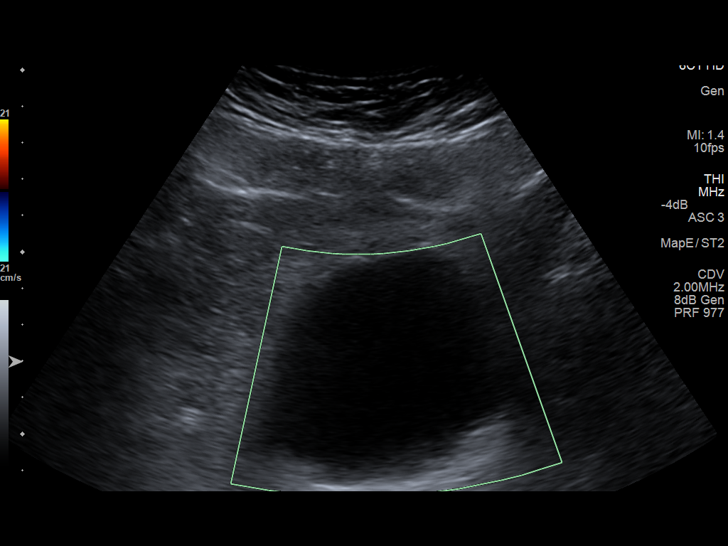

[14 of 25 positions shown; findings below may reference images not displayed]

FINDINGS: Right Kidney:

Length: 13.2 cm. Echogenicity within normal limits. No assault mass
or hydronephrosis visualized. 1.5 cm lower pole simple cyst.

Left Kidney:

Length: 11.9 cm. Echogenicity within normal limits. No solid mass or
hydronephrosis visualized. 2.4 cm parapelvic cyst.

Bladder:

Appears normal for degree of bladder distention.
IMPRESSION: 1.  No acute abnormality.  No hydronephrosis or bladder distention.

2.  Simple cyst right lower pole.  Parapelvic cyst left kidney.

## 2015-11-11 ENCOUNTER — Other Ambulatory Visit: Payer: Self-pay | Admitting: Cardiovascular Disease

## 2015-11-11 NOTE — Telephone Encounter (Signed)
Rx(s) sent to pharmacy electronically.  

## 2016-02-09 ENCOUNTER — Ambulatory Visit (INDEPENDENT_AMBULATORY_CARE_PROVIDER_SITE_OTHER): Payer: Medicare Other | Admitting: Cardiovascular Disease

## 2016-02-09 ENCOUNTER — Encounter: Payer: Self-pay | Admitting: Cardiovascular Disease

## 2016-02-09 VITALS — BP 130/70 | HR 79 | Ht 73.0 in | Wt 234.0 lb

## 2016-02-09 DIAGNOSIS — D649 Anemia, unspecified: Secondary | ICD-10-CM

## 2016-02-09 DIAGNOSIS — I1310 Hypertensive heart and chronic kidney disease without heart failure, with stage 1 through stage 4 chronic kidney disease, or unspecified chronic kidney disease: Secondary | ICD-10-CM | POA: Diagnosis not present

## 2016-02-09 DIAGNOSIS — Z7901 Long term (current) use of anticoagulants: Secondary | ICD-10-CM

## 2016-02-09 DIAGNOSIS — Z79899 Other long term (current) drug therapy: Secondary | ICD-10-CM | POA: Diagnosis not present

## 2016-02-09 DIAGNOSIS — I739 Peripheral vascular disease, unspecified: Secondary | ICD-10-CM

## 2016-02-09 DIAGNOSIS — Z5181 Encounter for therapeutic drug level monitoring: Secondary | ICD-10-CM

## 2016-02-09 DIAGNOSIS — I7409 Other arterial embolism and thrombosis of abdominal aorta: Secondary | ICD-10-CM

## 2016-02-09 DIAGNOSIS — Z86711 Personal history of pulmonary embolism: Secondary | ICD-10-CM | POA: Insufficient documentation

## 2016-02-09 DIAGNOSIS — E785 Hyperlipidemia, unspecified: Secondary | ICD-10-CM | POA: Diagnosis not present

## 2016-02-09 DIAGNOSIS — I701 Atherosclerosis of renal artery: Secondary | ICD-10-CM

## 2016-02-09 DIAGNOSIS — I714 Abdominal aortic aneurysm, without rupture, unspecified: Secondary | ICD-10-CM

## 2016-02-09 NOTE — Progress Notes (Addendum)
Patient ID: Tony Zhang, male   DOB: 1946-02-01, 70 y.o.   MRN: [id number]    Cardiology Office Note    Date:  02/09/2016   ID:  Tony Zhang, DOB 02/01/1946, MRN [id number]  PCP:  [name], MD  Cardiologist:   Thurmon Fair, MD   Chief Complaint  Patient presents with  . 6 MONTHS  . Edema    LEFT LEG    History of Present Illness:  Tony Zhang is a 70 y.o. male with PAD, HTN, AAA and terminal aortic occlusion, hyperlipidemia. Feels much better than he did last year, but still has occasional twinges in his right chest where he had empyema that required surgical evacuation in 2016. Has not had any new episodes of gastrointestinal bleeding.  He denies angina or dyspnea on exertion. He is limited primarily by back pain. Despite severe lower extremity arterial insufficiency, he does not have intermittent claudication. He has not had syncope, palpitations or leg edema. He denies abdominal pain, hematochezia or hematemesis.[name]  He has severe PAD. He has a small abdominal aortic aneurysm in the infrarenal area and then the aorta is totally occluded above the iliac bifurcation. He has bilateral severely reduced ABIs of 0.7-0.8. There is collateral flow to the lower extremities only. He is suspected to have coronary disease, possible occlusion of the right coronary artery as per findings of previous nuclear stress testing. Since he is free of angina and has preserved left ventricular systolic function this is being managed medically. 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 Fredirick Maudlin office   Past Medical History  Diagnosis Date  . Venous thrombosis     Recurrent  . Peripheral vascular disease (HCC)   . Dyslipidemia   . AAA (abdominal aortic aneurysm) (HCC)   . COPD (chronic obstructive pulmonary disease) (HCC)   . Gout   . DJD (degenerative joint disease)   . Malignant hypertension    . Shortness of breath   . Pneumonia 08/2013    pt. reports that he is having this surgery 11/25/2014- for the lung problem that began with pneumonia in 09/2014  . 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      Past Surgical History  Procedure Laterality Date  . Back surgery    . Hernia repair    . Appendectomy    . Shoulder surgery Right   . Eye surgery      both eyes, cataracts removed, denies lens implants  . Video bronchoscopy N/A 11/25/2014    Procedure: VIDEO BRONCHOSCOPY;  Surgeon: Delight Ovens, MD;  [facility] OR;  Service: Thoracic;  Laterality: N/A;  . Video assisted thoracoscopy Right 11/25/2014    Procedure: VIDEO ASSISTED THORACOSCOPY;  Surgeon: Delight Ovens, MD;  Location: [facility] OR;  Service: Thoracic;  Laterality: Right;  . Empyema drainage N/A 11/25/2014    Procedure: EMPYEMA DRAINAGE;  Surgeon: Delight Ovens, MD;  Location: [facility] OR;  Service: Thoracic;  Laterality: N/A;  . Esophagogastroduodenoscopy N/A 04/10/2015    Procedure: ESOPHAGOGASTRODUODENOSCOPY (EGD);  Surgeon: Charlott Rakes, MD;  Location: [facility] ENDOSCOPY;  Service: Endoscopy;  Laterality: N/A;  . Flexible sigmoidoscopy N/A 04/12/2015    Procedure: FLEXIBLE SIGMOIDOSCOPY;  Surgeon: Carman Ching, MD;  Location: [facility] ENDOSCOPY;  Service: Endoscopy;  Laterality: N/A;  . Colonoscopy N/A 04/13/2015    Procedure: COLONOSCOPY;  Surgeon: Carman Ching, MD;  Location: [facility] ENDOSCOPY;  Service: Endoscopy;  Laterality: N/A;    Current Medications: Outpatient Prescriptions Prior to Visit  Medication Sig Dispense Refill  . allopurinol (ZYLOPRIM) 300 MG tablet Take 1 tablet (300 mg total) by mouth daily. Start 04/21/15 30 tablet 0  . cilostazol (PLETAL) 100 MG tablet TAKE ONE TABLET BY MOUTH TWICE DAILY 180 tablet 2  . fenofibrate 160 MG tablet TAKE 1 TABLET BY MOUTH ONCE DAILY 30 tablet 5  . HYDROcodone-acetaminophen (NORCO) 10-325 MG per tablet Take 1 tablet  by mouth every 4 (four) hours as needed for severe pain. 30 tablet 0  . losartan-hydrochlorothiazide (HYZAAR) 100-25 MG tablet TAKE 1 TABLET BY MOUTH DAILY 90 tablet 3  . nebivolol (BYSTOLIC) 10 MG tablet Take 1 tablet (10 mg total) by mouth daily. 30 tablet 11  . pantoprazole (PROTONIX) 40 MG tablet Take 1 tablet (40 mg total) by mouth every 12 (twelve) hours. 60 tablet 0  . PROAIR HFA 108 (90 BASE) MCG/ACT inhaler Inhale 3-4 puffs into the lungs every 4 (four) hours as needed for wheezing or shortness of breath.     . simvastatin (ZOCOR) 40 MG tablet TAKE 1 TABLET BY MOUTH ONCE DAILY AT 6PM 30 tablet 5  . warfarin (COUMADIN) 2 MG tablet Take 1 tablet (2 mg total) by mouth daily. 30 tablet 0  . warfarin (COUMADIN) 2.5 MG tablet TAKE 1 TABLET BY MOUTH DAILY OR AS DIRECTED (Patient taking differently: Take 2.5 mg by mouth daily. ) 30 tablet 4   No facility-administered medications prior to visit.     Allergies:   Codeine   Social History   Social History  . Marital Status: Divorced    Spouse Name: N/A  . Number of Children: N/A  . Years of Education: N/A   Social History Main Topics  . Smoking status: Former Smoker    Quit date: 10/18/1992  . Smokeless tobacco: Never Used  . Alcohol Use: No  . Drug Use: No  . Sexual Activity: Not Asked   Other Topics Concern  . None   Social History Narrative    ROS:   Please see the history of present illness.    ROS All other systems reviewed and are negative.   PHYSICAL EXAM:   VS:  BP 130/70 mmHg  Pulse 79  Ht  (1.854 m)  Wt 106.142 kg (234 lb)  BMI 30.88 kg/m2   GEN: Well nourished, well developed, in no acute distress HEENT: normal Neck: no JVD, carotid bruits, or masses Cardiac: RRR; no murmurs, rubs, or gallops,no edema , 1+ right femoral, nonpalpable posterior tibial and dorsalis pedis pulses; 1+ left femoral, nonpalpable posterior tibial and dorsalis pedis pulses; no subclavian or femoral bruits Respiratory:   Emphysematous changes, clear to auscultation bilaterally, normal work of breathing GI: soft, nontender, nondistended, + BS MS: no deformity or atrophy Skin: warm and dry, no rash Neuro:  Alert and Oriented x 3, Strength and sensation are intact Psych: euthymic mood, full affect  Wt Readings from Last 3 Encounters:  02/09/16 106.142 kg (234 lb)  08/07/15 103.874 kg (229 lb)  08/04/15 102.876 kg (226 lb 12.8 oz)      Studies/Labs Reviewed:   EKG:  EKG is ordered today.  The ekg ordered today demonstrates Sinus rhythm, left axis deviation and incomplete right bundle branch block/left anterior fascicular block. No significant change from before  Recent Labs: 04/08/2015: B Natriuretic Peptide 187.8* 04/09/2015: ALT 9* 04/13/2015: Hemoglobin 10.6*; Platelets  351 04/14/2015: BUN 15; Creatinine, Ser 0.99; Magnesium 1.7; Potassium 4.2; Sodium 136   Lipid Panel    Component Value Date/Time   CHOL 153 09/17/2013 1135   TRIG 298* 09/17/2013 1135   HDL 18* 09/17/2013 1135   CHOLHDL 8.5 09/17/2013 1135   VLDL 60* 09/17/2013 1135   LDLCALC 75 09/17/2013 1135     ASSESSMENT:    1. Malignant HTN with heart disease, w/o CHF, with chronic kidney disease   2. Chronic distal aortic occlusion (HCC)   3. Peripheral vascular disease (HCC)   4. Bilateral renal artery stenosis (HCC)   5. Abdominal aortic aneurysm (AAA) without rupture (HCC)   6. Dyslipidemia   7. History of pulmonary embolism   8. Anticoagulated on Coumadin   9. Anemia, unspecified anemia type   10. Medication management      PLAN:  In order of problems listed above:  1. HTN is well-controlled and renal function is within normal limits.  History of acute kidney injury related to GI bleeding in 2016, fully recovered. Interestingly, over the years his blood pressure has become much easier to manage. 2. Abd aortic occlusion is asymptomatic 3. PAD without significant intermittent claudication 4. Bilateral renal artery stenosis  -tolerating angiotensin receptor blocker well. 5. AAA stable in size for a long time 6. HLP time to repeat a lipid profile 7. DVT/PE with recurrence of left lower extremity DVT when anticoagulation was stopped in the past. Currently no signs of postphlebitic syndrome or symptoms to suggest active venous thrombosis. 8. Warfarin with prothrombin time followed in Dr. [name]'s office 9. Anemia we'll recheck today since we are drawing other labs.   Medication Adjustments/Labs and Tests Ordered: Current medicines are reviewed at length with the patient today.  Concerns regarding medicines are outlined above.  Medication changes, Labs and Tests ordered today are listed in the Patient Instructions below. Patient Instructions  Dr Royann Shiversroitoru recommends that you continue on your current medications as directed. Please refer to the Current Medication list given to you today.  Your physician recommends that you return for lab work at your earliest convenience - FASTING.  Dr Royann Shiversroitoru recommends that you schedule a follow-up appointment in 6 months. You will receive a reminder letter in the mail two months in advance. If you don't receive a letter, please call our office to schedule the follow-up appointment.  If you need a refill on your cardiac medications before your next appointment, please call your pharmacy.    Joie BimlerSigned, Tee Richeson, MD  02/09/2016 5:32 PM    [facility] [address], [place], [place]  [address] Phone: [phone]; Fax: [phone]

## 2016-02-09 NOTE — Patient Instructions (Signed)
Dr Croitoru recommends that you continue on your current medications as directed. Please refer to the Current Medication list given to you today.  Your physician recommends that you return for lab work at your earliest convenience - FASTING.  Dr Croitoru recommends that you schedule a follow-up appointment in 6 months. You will receive a reminder letter in the mail two months in advance. If you don't receive a letter, please call our office to schedule the follow-up appointment.  If you need a refill on your cardiac medications before your next appointment, please call your pharmacy. 

## 2016-03-16 ENCOUNTER — Other Ambulatory Visit: Payer: Self-pay | Admitting: Cardiovascular Disease

## 2016-03-16 NOTE — Telephone Encounter (Signed)
Rx(s) sent to pharmacy electronically.  

## 2016-03-26 ENCOUNTER — Other Ambulatory Visit: Payer: Self-pay | Admitting: Cardiovascular Disease

## 2016-03-26 NOTE — Telephone Encounter (Signed)
Rx request sent to pharmacy.  

## 2016-04-29 ENCOUNTER — Other Ambulatory Visit: Payer: Self-pay | Admitting: Cardiovascular Disease

## 2016-06-06 IMAGING — CT CT CHEST W/ CM
2 of 3 series · 15 of 36 positions shown, 18 images · IV contrast (75CC OMNI 300)
Comparison: 11/15/2007

CLINICAL DATA: Shortness of breath. Recent hospitalization for
pneumonia.

EXAM:
CT CHEST WITH CONTRAST
TECHNIQUE: Multidetector CT imaging of the chest was performed during
intravenous contrast administration.
CONTRAST:  75mL OMNIPAQUE IOHEXOL 300 MG/ML  SOLN

[Series 2: chest with · axial · 0.75mm/px · z∈[-341,-71]mm · 12 of 64 slices shown, 15 images]
[im 5/64  mediastinal]
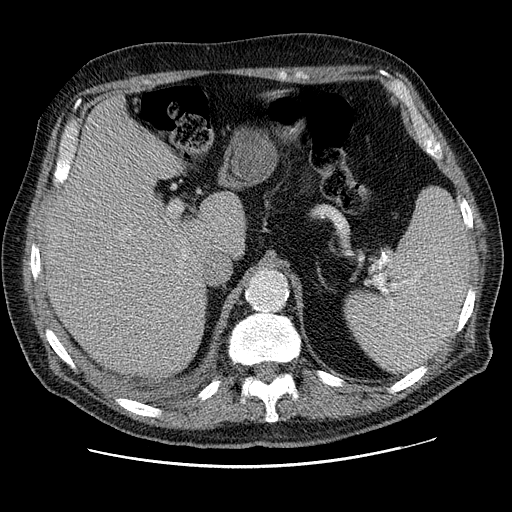
[im 5/64  lung]
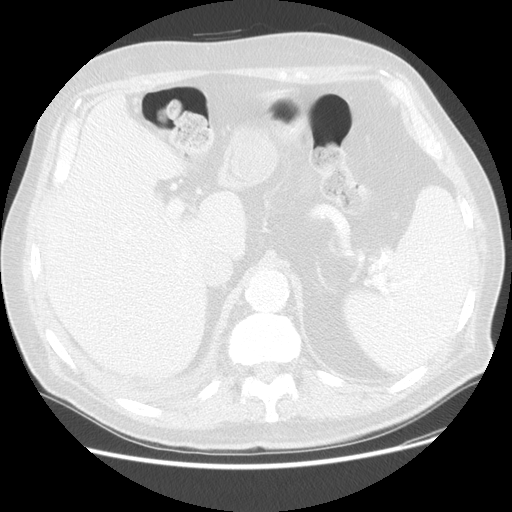
[im 10/64  lung]
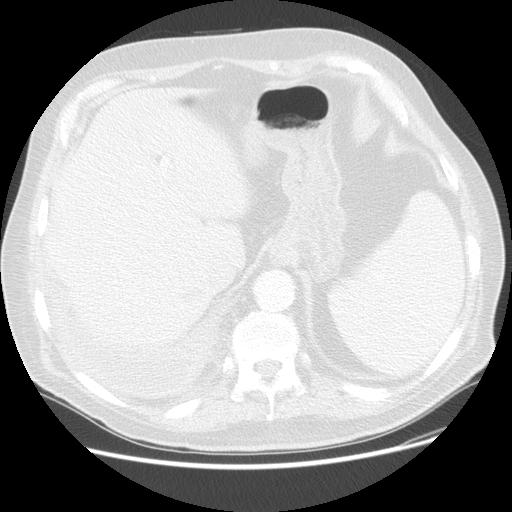
[im 15/64  lung]
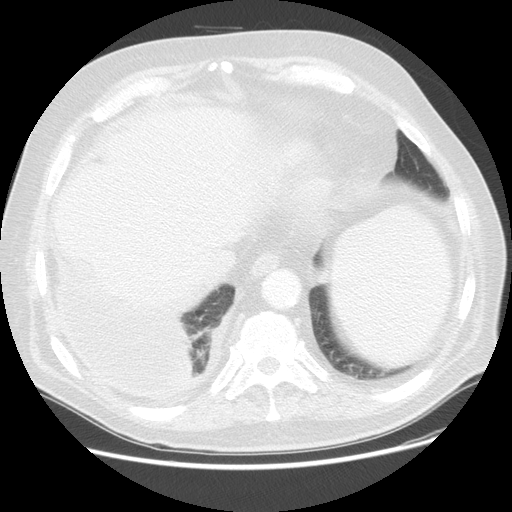
[im 19/64  lung]
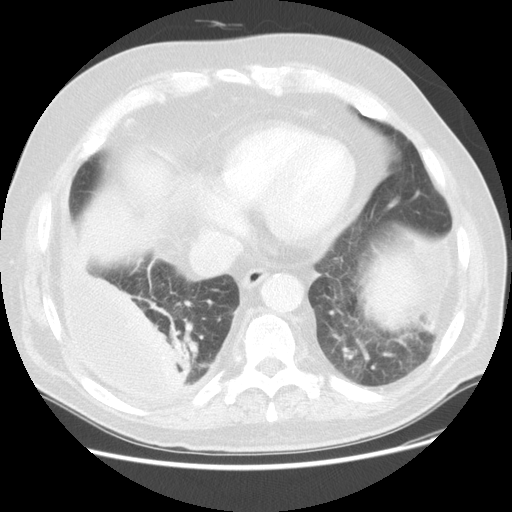
[im 24/64  mediastinal]
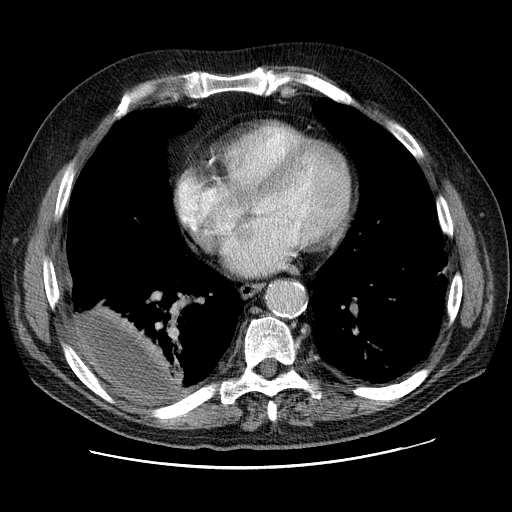
[im 24/64  lung]
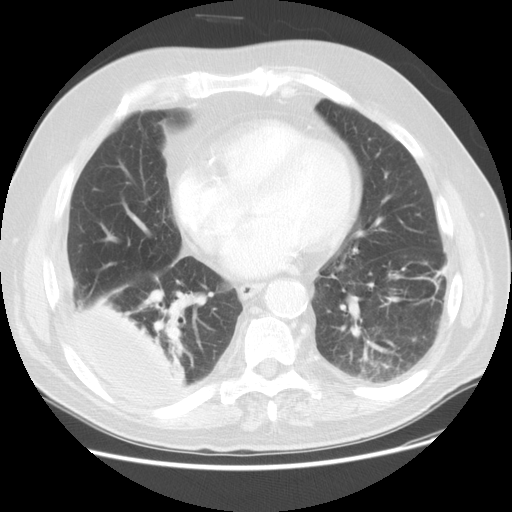
[im 29/64  lung]
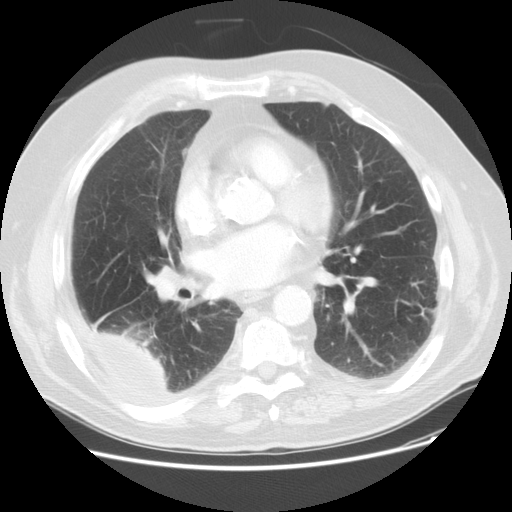
[im 36/64  lung]
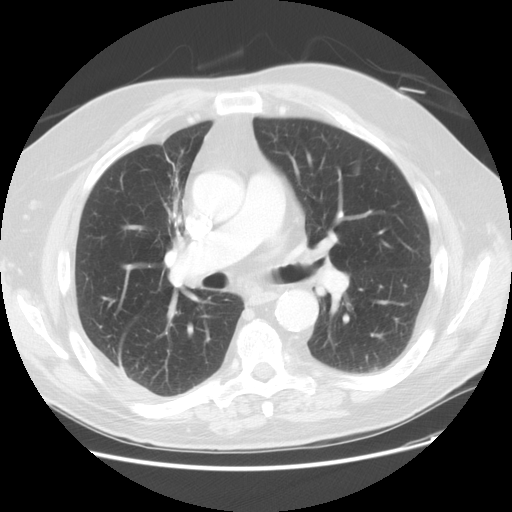
[im 40/64  lung]
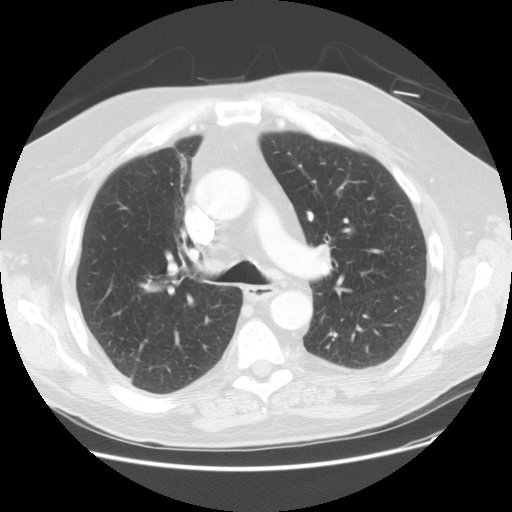
[im 45/64  mediastinal]
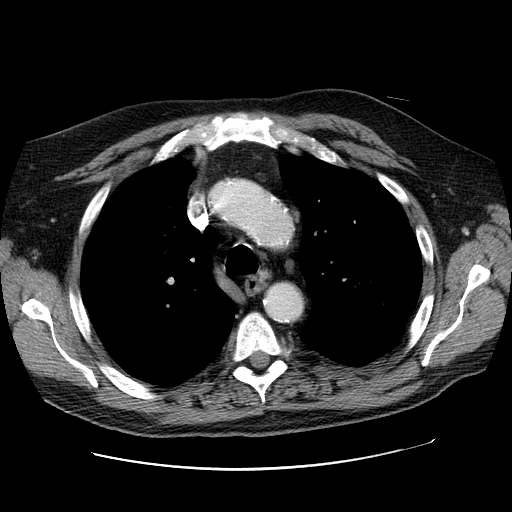
[im 45/64  lung]
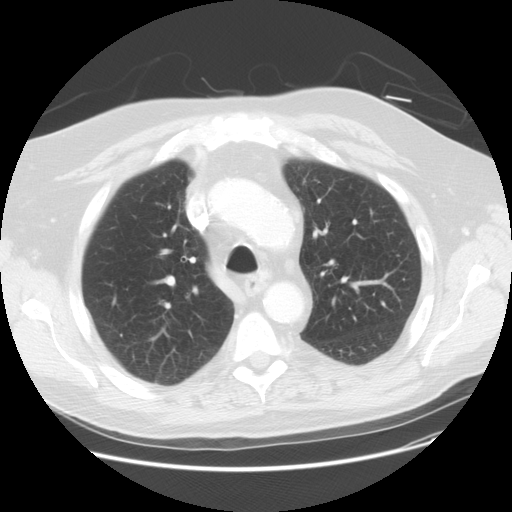
[im 50/64  lung]
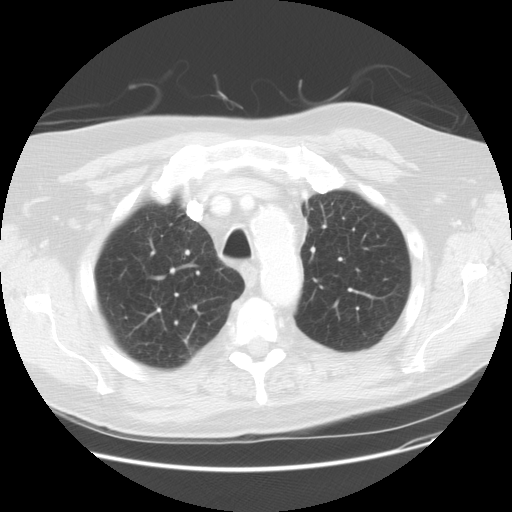
[im 54/64  lung]
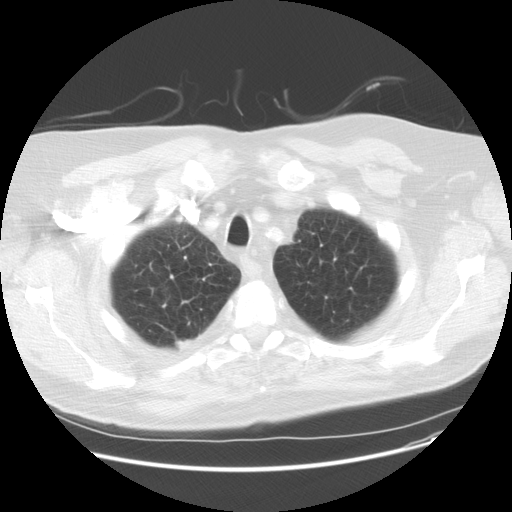
[im 59/64  lung]
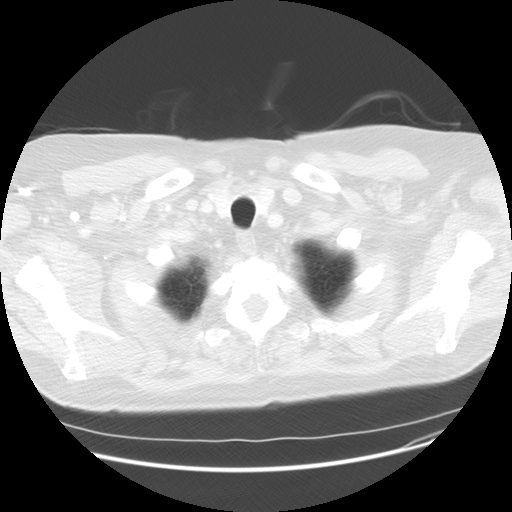

[Series 400: cor · coronal · 0.75mm/px · 3 of 159 slices shown]
[im 32/159  lung]
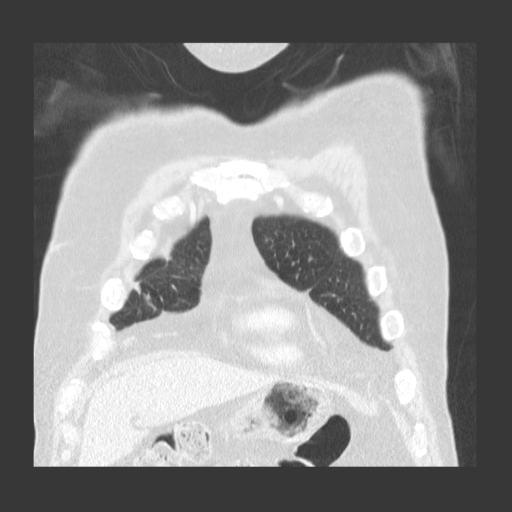
[im 64/159  lung]
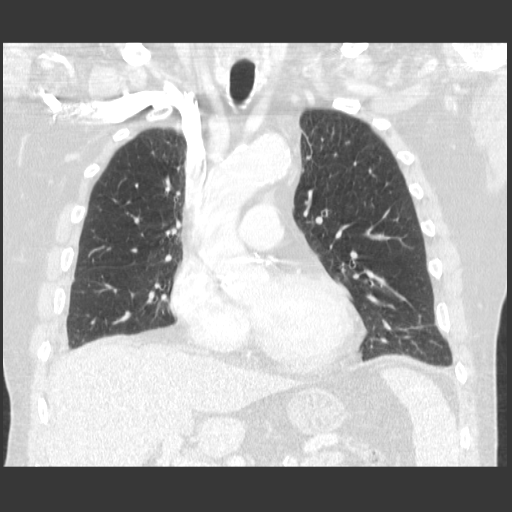
[im 95/159  lung]
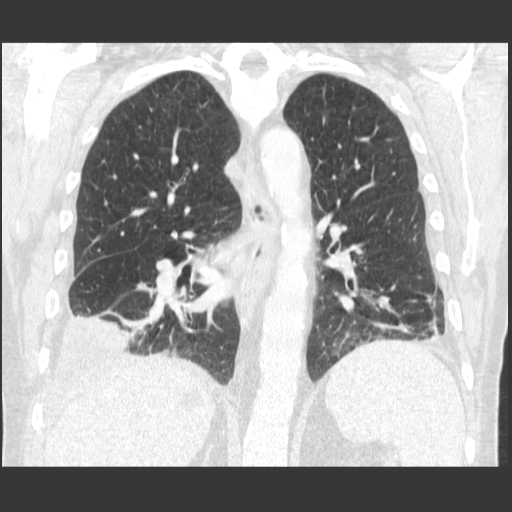

[15 of 36 positions shown; findings below may reference images not displayed]

FINDINGS: Mediastinum: The heart size appears normal. There is no pericardial
effusion. Calcified atherosclerotic disease involves the thoracic
aorta. The transverse aortic arch measures 4.0 cm in maximum AP
dimension, image 99 of series 401. The ascending aorta measures
cm. The trachea appears patent and is midline. Normal appearance of
the esophagus. Right paratracheal lymph node is borderline enlarged
measuring 1 cm, image 22/series 2. There is a right hilar lymph node
which measures 1.5 cm, image 37/series 2.

Lungs/Pleura: Moderate changes of centrilobular and paraseptal
emphysema. There is a loculated right pleural effusion at the right
base. This measures 12.2 by 5.8 cm, image 45/series 2. Overlying
platelike atelectasis is identified. Scar noted in the left lower
lobe. In the right middle lobe there is a nodule measuring 7 mm,
image 34/series 3. New from previous exam. Also in the right middle
lobe is a subpleural nodule measuring 7 mm, image 35/series 3. This
is unchanged from 8114.

Upper Abdomen: Visualized portions of the adrenal glands and spleen
are normal. Low-attenuation structure within segment 5 of the liver
measures 1.3 cm, image 54/series 2. This is unchanged from 8114 and
is compatible with a benign abnormality.

Musculoskeletal: There are no aggressive lytic or sclerotic bone
lesions identified.
IMPRESSION: 1. Loculated right pleural effusion may represent and empyema.
Consider further evaluation with diagnostic thoracentesis.
2. Enlarged right hilar and right paratracheal lymph nodes are
nonspecific and may be reactive in the setting of infection.
3. Pulmonary nodule in the right middle lobe measures 7 mm and is
new from previous exam. If the patient is at high risk for
bronchogenic carcinoma, follow-up chest CT at 3-9months is
recommended. If the patient is at low risk for bronchogenic
carcinoma, follow-up chest CT at 6-12 months is recommended. This
recommendation follows the consensus statement: Guidelines for
Management of Small Pulmonary Nodules Detected on CT Scans: A
Statement from the [HOSPITAL] as published in Radiology
3554; [DATE].
4. Diffuse bronchial wall thickening with emphysema, as above;
imaging findings suggestive of underlying COPD.
5. Atherosclerotic disease. The aortic arch is aneurysmally dilated
measuring 4 cm. Recommend semi-annual imaging followup by CTA or MRA
and referral to cardiothoracic surgery if not already obtained. This
recommendation follows 8565
ACCF/AHA/AATS/ACR/ASA/SCA/VANDERLI/HTX/ANNE-KJERSTI/SUHARTONO RFP Guidelines for the
Diagnosis and Management of Patients With Thoracic Aortic Disease.
Circulation. 8565; 121: [id number]

## 2016-06-16 IMAGING — CR DG CHEST 1V PORT
1 series · 1 of 1 positions shown · non-contrast
Comparison: 11/27/2014.

CLINICAL DATA: Chest tube evaluation.

EXAM:
PORTABLE CHEST - 1 VIEW

[AP]
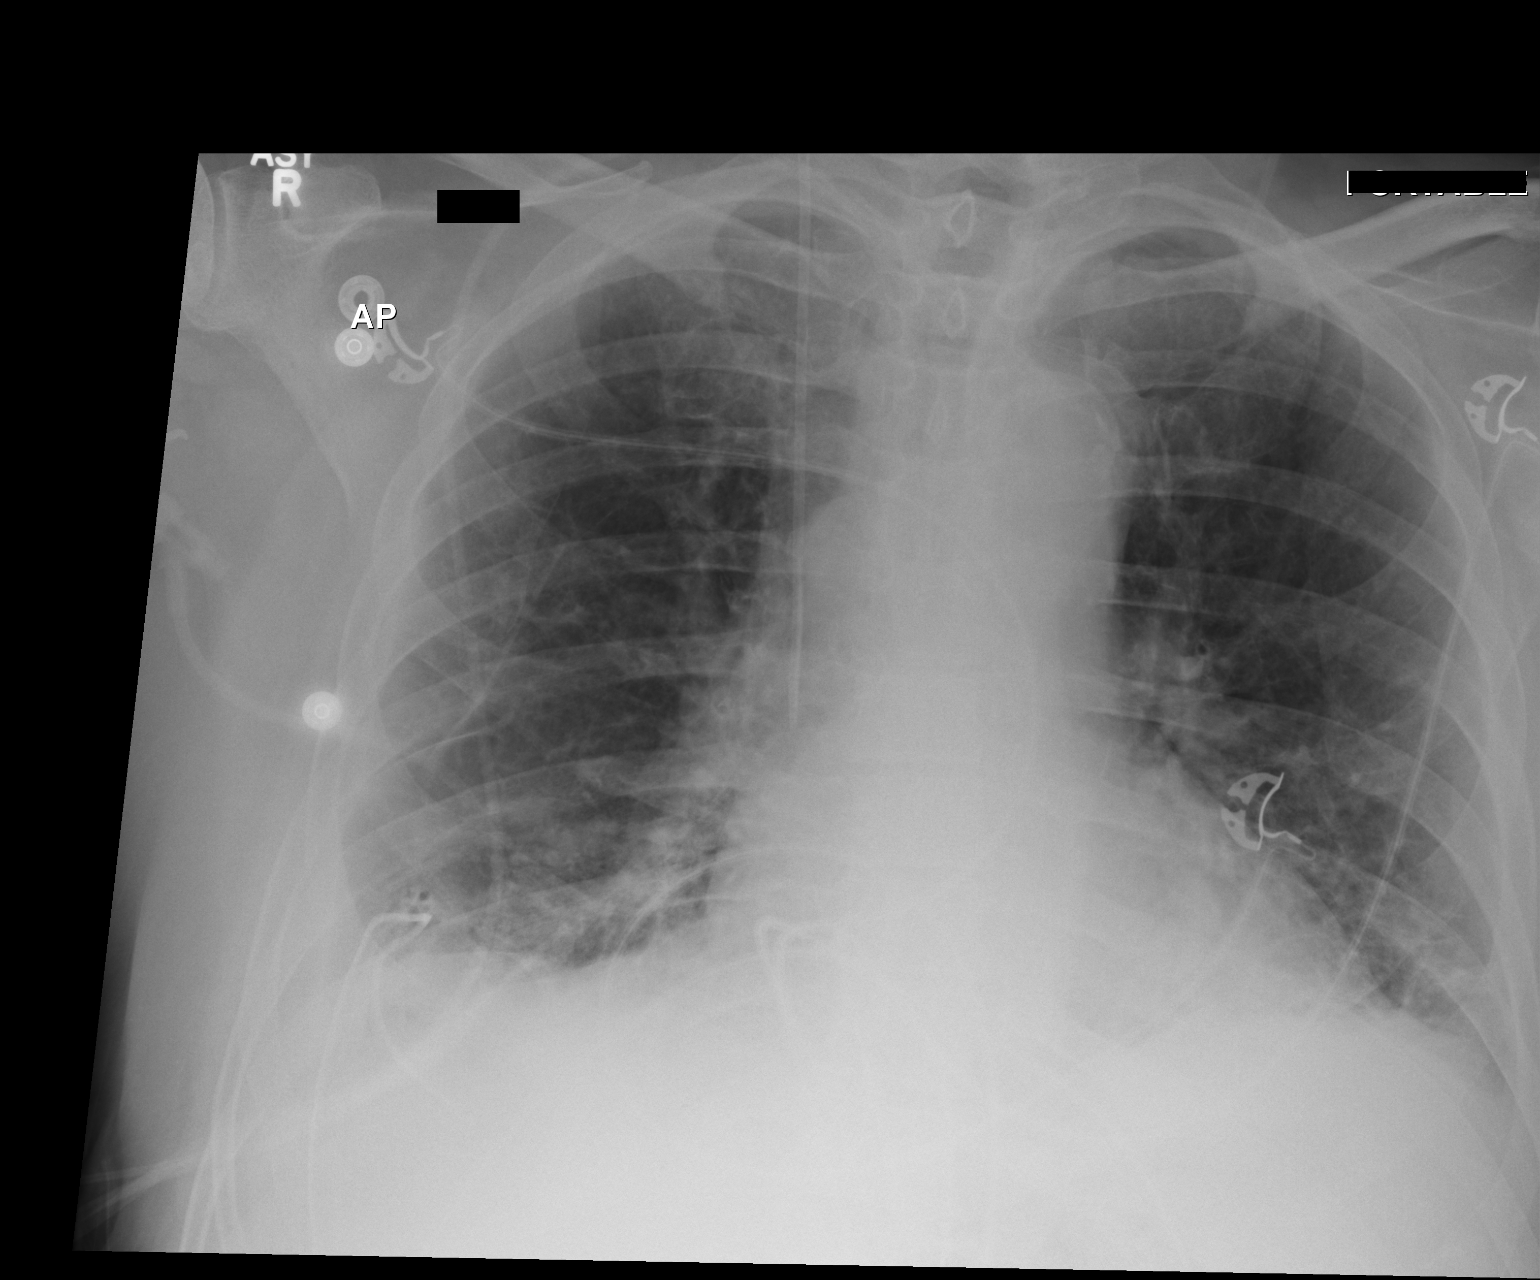

[1 of 1 positions shown; findings below may reference images not displayed]

FINDINGS: Right IJ line and right chest tube in stable position. Mediastinal
structures are normal. Bibasilar atelectasis and/or infiltrates
remain. No interim change. Stable small right pleural effusion. No
pneumothorax.
IMPRESSION: 1. Right IJ line right chest tube in stable position. No
pneumothorax. Stable small residual right pleural effusion.
2. Stable bibasilar atelectasis and/or infiltrates.
3. Stable cardiomegaly.

## 2016-07-12 ENCOUNTER — Other Ambulatory Visit: Payer: Self-pay | Admitting: Cardiovascular Disease

## 2016-07-27 ENCOUNTER — Other Ambulatory Visit: Payer: Self-pay | Admitting: Cardiovascular Disease

## 2016-08-16 ENCOUNTER — Other Ambulatory Visit: Payer: Self-pay | Admitting: Cardiovascular Disease

## 2016-08-16 NOTE — Telephone Encounter (Signed)
Rx(s) sent to pharmacy electronically.  

## 2016-08-24 ENCOUNTER — Other Ambulatory Visit: Payer: Self-pay | Admitting: Cardiovascular Disease

## 2016-10-25 IMAGING — CR DG CHEST 1V PORT
1 series · 1 of 1 positions shown · non-contrast
Comparison: CT chest dated 03/25/2015

CLINICAL DATA: Cough, weakness, shortness of breath, diagnosed with
pneumonia 2 weeks ago

EXAM:
PORTABLE CHEST - 1 VIEW

[AP]
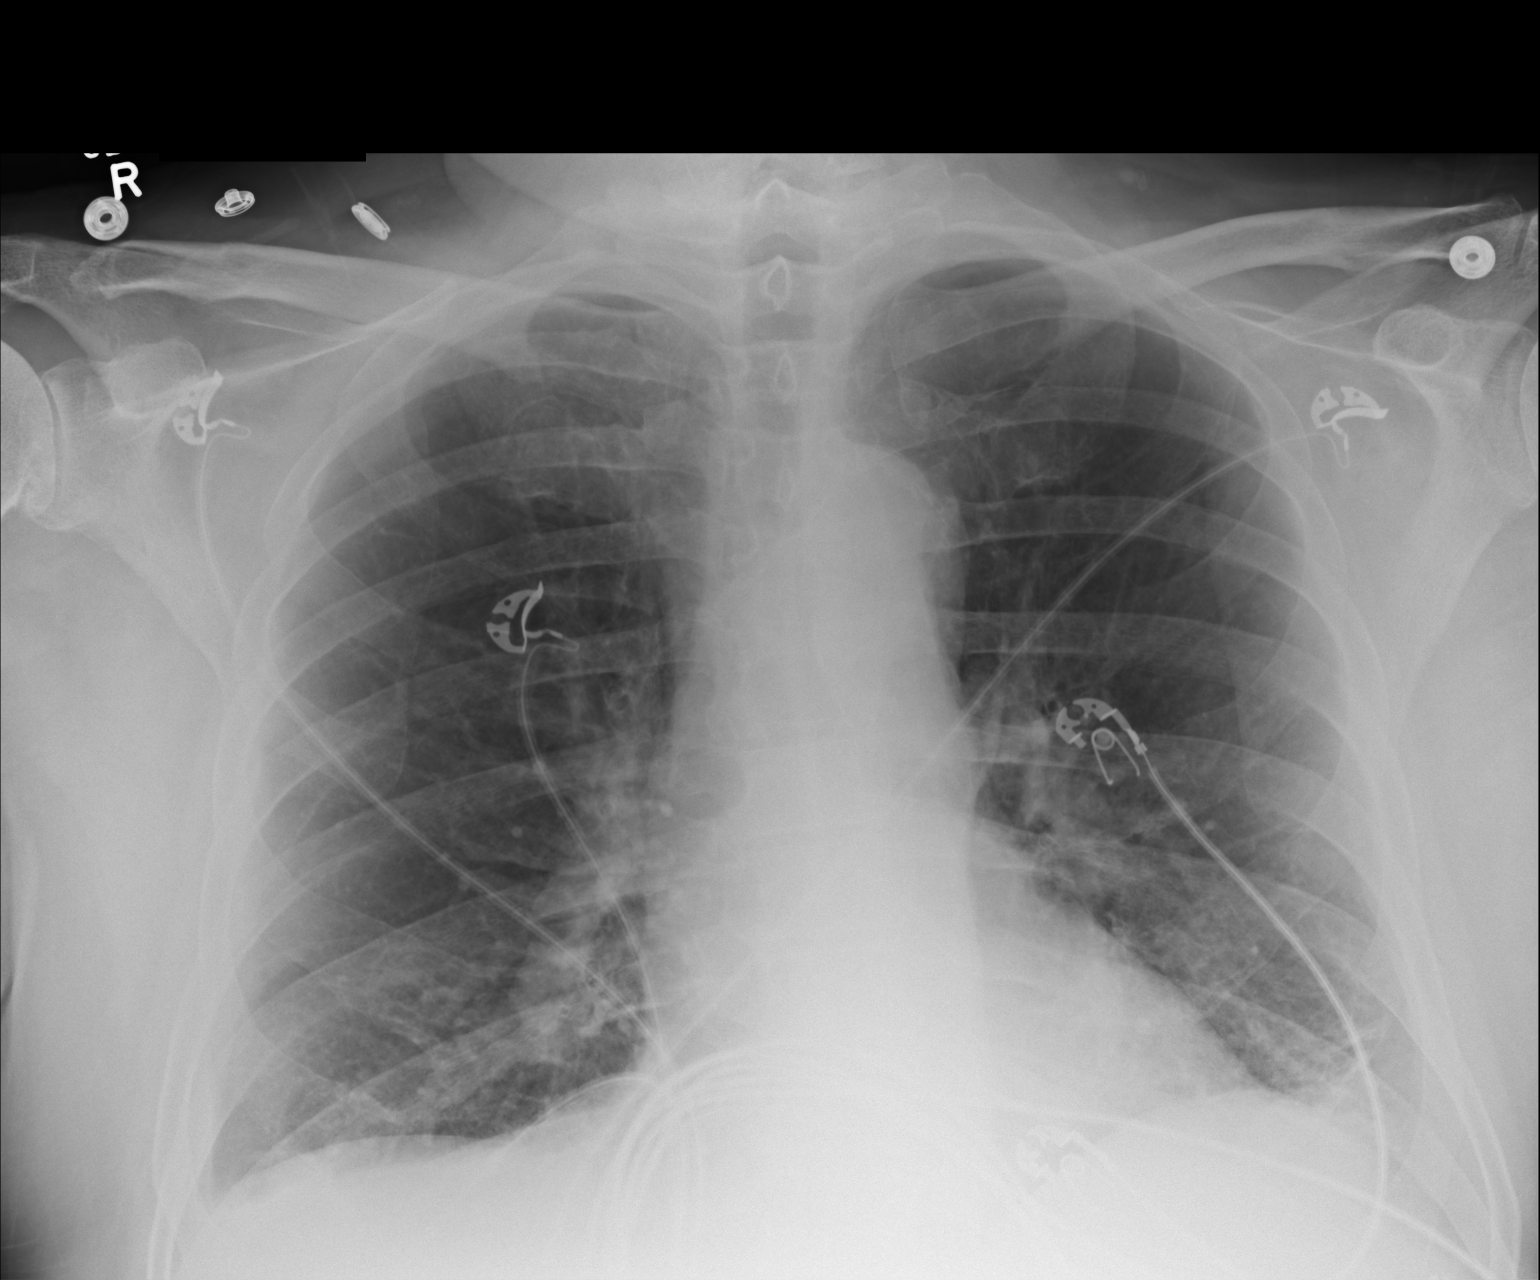

[1 of 1 positions shown; findings below may reference images not displayed]

FINDINGS: Mild patchy opacity at the left lung base is favored to reflect
subpleural scarring/ atelectasis, less likely pneumonia. Suspected
chronic pleural thickening without definite pleural effusion (when
correlating with CT). No pneumothorax.

The heart is normal in size.
IMPRESSION: Mild patchy opacity at the left lung base, favored to reflect
subpleural scarring/atelectasis, less likely pneumonia.

## 2017-02-17 ENCOUNTER — Other Ambulatory Visit: Payer: Self-pay | Admitting: Cardiovascular Disease

## 2017-03-07 ENCOUNTER — Other Ambulatory Visit: Payer: Self-pay | Admitting: Cardiovascular Disease

## 2017-03-07 NOTE — Telephone Encounter (Signed)
Rx request sent to pharmacy.  

## 2017-03-14 ENCOUNTER — Other Ambulatory Visit: Payer: Self-pay | Admitting: Cardiovascular Disease

## 2017-03-25 ENCOUNTER — Other Ambulatory Visit: Payer: Self-pay

## 2017-03-25 MED ORDER — FENOFIBRATE 160 MG PO TABS: 160.0000 mg | ORAL_TABLET | Freq: Every day | ORAL | 3 refills | Status: DC

## 2017-04-04 ENCOUNTER — Other Ambulatory Visit: Payer: Self-pay | Admitting: Cardiovascular Disease

## 2017-05-02 ENCOUNTER — Other Ambulatory Visit: Payer: Self-pay | Admitting: Cardiovascular Disease

## 2017-05-03 NOTE — Telephone Encounter (Signed)
REFILL 

## 2017-05-04 ENCOUNTER — Other Ambulatory Visit: Payer: Self-pay | Admitting: Cardiovascular Disease

## 2017-05-10 ENCOUNTER — Other Ambulatory Visit: Payer: Self-pay | Admitting: Cardiovascular Disease

## 2017-05-14 ENCOUNTER — Other Ambulatory Visit: Payer: Self-pay | Admitting: Cardiovascular Disease

## 2017-05-28 ENCOUNTER — Other Ambulatory Visit: Payer: Self-pay | Admitting: Cardiovascular Disease

## 2017-05-30 ENCOUNTER — Other Ambulatory Visit: Payer: Self-pay | Admitting: Cardiovascular Disease

## 2017-10-31 ENCOUNTER — Other Ambulatory Visit: Payer: Self-pay | Admitting: Cardiovascular Disease

## 2017-10-31 NOTE — Telephone Encounter (Signed)
Rx(s) sent to pharmacy electronically.  

## 2017-11-12 ENCOUNTER — Other Ambulatory Visit: Payer: Self-pay | Admitting: Cardiovascular Disease

## 2017-11-14 NOTE — Telephone Encounter (Signed)
Rx(s) sent to pharmacy electronically.  

## 2018-03-05 ENCOUNTER — Other Ambulatory Visit: Payer: Self-pay

## 2018-03-05 ENCOUNTER — Emergency Department (HOSPITAL_COMMUNITY)
Admission: EM | Admit: 2018-03-05 | Discharge: 2018-03-05 | Disposition: A | Discharge status: Home / Self Care | Payer: No Typology Code available for payment source | Attending: Emergency Medicine | Admitting: Emergency Medicine

## 2018-03-05 ENCOUNTER — Emergency Department (HOSPITAL_COMMUNITY): Payer: No Typology Code available for payment source

## 2018-03-05 ENCOUNTER — Encounter (HOSPITAL_COMMUNITY): Payer: Self-pay

## 2018-03-05 DIAGNOSIS — E785 Hyperlipidemia, unspecified: Secondary | ICD-10-CM | POA: Diagnosis not present

## 2018-03-05 DIAGNOSIS — Z86718 Personal history of other venous thrombosis and embolism: Secondary | ICD-10-CM | POA: Diagnosis not present

## 2018-03-05 DIAGNOSIS — R51 Headache: Secondary | ICD-10-CM | POA: Diagnosis not present

## 2018-03-05 DIAGNOSIS — Y998 Other external cause status: Secondary | ICD-10-CM | POA: Insufficient documentation

## 2018-03-05 DIAGNOSIS — Y92410 Unspecified street and highway as the place of occurrence of the external cause: Secondary | ICD-10-CM | POA: Diagnosis not present

## 2018-03-05 DIAGNOSIS — S199XXA Unspecified injury of neck, initial encounter: Secondary | ICD-10-CM | POA: Diagnosis present

## 2018-03-05 DIAGNOSIS — Z87891 Personal history of nicotine dependence: Secondary | ICD-10-CM | POA: Insufficient documentation

## 2018-03-05 DIAGNOSIS — S134XXA Sprain of ligaments of cervical spine, initial encounter: Secondary | ICD-10-CM | POA: Insufficient documentation

## 2018-03-05 DIAGNOSIS — Y939 Activity, unspecified: Secondary | ICD-10-CM | POA: Insufficient documentation

## 2018-03-05 DIAGNOSIS — Z79899 Other long term (current) drug therapy: Secondary | ICD-10-CM | POA: Insufficient documentation

## 2018-03-05 DIAGNOSIS — R109 Unspecified abdominal pain: Secondary | ICD-10-CM | POA: Insufficient documentation

## 2018-03-05 DIAGNOSIS — J449 Chronic obstructive pulmonary disease, unspecified: Secondary | ICD-10-CM | POA: Diagnosis not present

## 2018-03-05 DIAGNOSIS — Z7901 Long term (current) use of anticoagulants: Secondary | ICD-10-CM | POA: Insufficient documentation

## 2018-03-05 DIAGNOSIS — I10 Essential (primary) hypertension: Secondary | ICD-10-CM | POA: Insufficient documentation

## 2018-03-05 DIAGNOSIS — S139XXA Sprain of joints and ligaments of unspecified parts of neck, initial encounter: Secondary | ICD-10-CM

## 2018-03-05 LAB — CBC WITH DIFFERENTIAL/PLATELET
ABS IMMATURE GRANULOCYTES: 0.2 10*3/uL — AB (ref 0.0–0.1)
BASOS PCT: 1 %
Basophils Absolute: 0.1 10*3/uL (ref 0.0–0.1)
EOS ABS: 0.1 10*3/uL (ref 0.0–0.7)
Eosinophils Relative: 1 %
HEMATOCRIT: 47.5 % (ref 39.0–52.0)
Hemoglobin: 16.5 g/dL (ref 13.0–17.0)
IMMATURE GRANULOCYTES: 2 %
LYMPHS ABS: 0.6 10*3/uL — AB (ref 0.7–4.0)
Lymphocytes Relative: 6 %
MCH: 31.3 pg (ref 26.0–34.0)
MCHC: 34.7 g/dL (ref 30.0–36.0)
MCV: 90 fL (ref 78.0–100.0)
MONO ABS: 0.8 10*3/uL (ref 0.1–1.0)
MONOS PCT: 8 %
NEUTROS ABS: 8.3 10*3/uL — AB (ref 1.7–7.7)
NEUTROS PCT: 82 %
Platelets: 237 10*3/uL (ref 150–400)
RBC: 5.28 MIL/uL (ref 4.22–5.81)
RDW: 14.5 % (ref 11.5–15.5)
WBC: 10.1 10*3/uL (ref 4.0–10.5)

## 2018-03-05 LAB — TYPE AND SCREEN
ABO/RH(D): B POS
Antibody Screen: NEGATIVE

## 2018-03-05 LAB — COMPREHENSIVE METABOLIC PANEL
ALK PHOS: 46 U/L (ref 38–126)
ALT: 16 U/L — AB (ref 17–63)
AST: 25 U/L (ref 15–41)
Albumin: 4.1 g/dL (ref 3.5–5.0)
Anion gap: 11 (ref 5–15)
BILIRUBIN TOTAL: 1.1 mg/dL (ref 0.3–1.2)
BUN: 21 mg/dL — AB (ref 6–20)
CALCIUM: 9.4 mg/dL (ref 8.9–10.3)
CO2: 20 mmol/L — ABNORMAL LOW (ref 22–32)
CREATININE: 1.57 mg/dL — AB (ref 0.61–1.24)
Chloride: 102 mmol/L (ref 101–111)
GFR, EST AFRICAN AMERICAN: 49 mL/min — AB (ref >60)
GFR, EST NON AFRICAN AMERICAN: 42 mL/min — AB (ref >60)
Glucose, Bld: 111 mg/dL — ABNORMAL HIGH (ref 65–99)
Potassium: 4.4 mmol/L (ref 3.5–5.1)
Sodium: 133 mmol/L — ABNORMAL LOW (ref 135–145)
Total Protein: 7.1 g/dL (ref 6.5–8.1)

## 2018-03-05 LAB — PROTIME-INR
INR: 1.74
PROTHROMBIN TIME: 20.2 s — AB (ref 11.4–15.2)

## 2018-03-05 MED ORDER — IOHEXOL 300 MG/ML  SOLN
100.0000 mL | Freq: Once | INTRAMUSCULAR | Status: AC | PRN
  Administered 2018-03-05: 100 mL via INTRAVENOUS

## 2018-03-05 MED ORDER — FENTANYL CITRATE (PF) 100 MCG/2ML IJ SOLN
50.0000 ug | Freq: Once | INTRAMUSCULAR | Status: AC
  Administered 2018-03-05: 50 ug via INTRAVENOUS
  Filled 2018-03-05: qty 2

## 2018-03-05 MED ORDER — OXYCODONE-ACETAMINOPHEN 5-325 MG PO TABS: 1.0000 | ORAL_TABLET | Freq: Four times a day (QID) | ORAL | 0 refills | Status: DC | PRN

## 2018-03-05 MED ORDER — FENTANYL CITRATE (PF) 100 MCG/2ML IJ SOLN
50.0000 ug | Freq: Once | INTRAMUSCULAR | Status: AC
  Administered 2018-03-05: 50 ug via INTRAVENOUS

## 2018-03-05 MED ORDER — ONDANSETRON HCL 4 MG/2ML IJ SOLN
4.0000 mg | Freq: Once | INTRAMUSCULAR | Status: AC
  Administered 2018-03-05: 4 mg via INTRAVENOUS
  Filled 2018-03-05: qty 2

## 2018-03-05 MED ORDER — METHOCARBAMOL 500 MG PO TABS: 500.0000 mg | ORAL_TABLET | Freq: Three times a day (TID) | ORAL | 0 refills | Status: DC | PRN

## 2018-03-05 MED ORDER — SODIUM CHLORIDE 0.9 % IV SOLN: Freq: Once | INTRAVENOUS | Status: DC

## 2018-03-05 MED ORDER — OXYCODONE-ACETAMINOPHEN 5-325 MG PO TABS
1.0000 | ORAL_TABLET | Freq: Once | ORAL | Status: AC
Start: 2018-03-05 — End: 2018-03-05
  Administered 2018-03-05: 1 via ORAL
  Filled 2018-03-05: qty 1

## 2018-03-05 NOTE — ED Notes (Signed)
[place] collar applied while maintaining cervical spine control; good movement and sensation to all 4 extremities prior to and after application - pt tolerating collar well

## 2018-03-05 NOTE — ED Notes (Signed)
Laying on stretcher in rgt lateral position - states pain is "better" - tolerating c-collar well

## 2018-03-05 NOTE — ED Notes (Signed)
Fentanyl of wasted with Ranee Gosselin, ED RN after patient's discharge

## 2018-03-05 NOTE — ED Notes (Signed)
Transported to radiology for CT scans
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
## 2018-03-05 NOTE — ED Notes (Signed)
PCXR done - pt medicated prior as his pain remains 10/10

## 2018-03-05 NOTE — ED Notes (Signed)
Transported from radiology per rad tech  

## 2018-03-05 NOTE — ED Notes (Signed)
Writer ambulated pt, pt took small steps, and wasn't able to stand up straight.  Throughout the ambulation pt complains of pain.

## 2018-03-05 NOTE — ED Notes (Signed)
Blood for labs drawn from existing IV to LFA - pt tolerated well

## 2018-04-25 ENCOUNTER — Ambulatory Visit
Admission: RE | Admit: 2018-04-25 | Discharge: 2018-04-25 | Disposition: A | Discharge status: Home / Self Care | Payer: Medicare Other | Source: Ambulatory Visit | Attending: Family Medicine | Admitting: Family Medicine

## 2018-04-25 ENCOUNTER — Other Ambulatory Visit: Payer: Self-pay | Admitting: Family Medicine

## 2018-04-25 DIAGNOSIS — M542 Cervicalgia: Secondary | ICD-10-CM

## 2018-07-03 ENCOUNTER — Other Ambulatory Visit: Payer: Self-pay | Admitting: Cardiovascular Disease

## 2018-07-26 ENCOUNTER — Other Ambulatory Visit: Payer: Self-pay | Admitting: Cardiovascular Disease

## 2018-07-27 ENCOUNTER — Other Ambulatory Visit: Payer: Self-pay | Admitting: Cardiovascular Disease

## 2018-08-21 ENCOUNTER — Other Ambulatory Visit: Payer: Self-pay | Admitting: Cardiovascular Disease

## 2018-08-24 ENCOUNTER — Other Ambulatory Visit: Payer: Self-pay | Admitting: Cardiovascular Disease

## 2018-08-25 ENCOUNTER — Other Ambulatory Visit: Payer: Self-pay | Admitting: Orthopaedic Surgery

## 2018-08-25 DIAGNOSIS — M542 Cervicalgia: Secondary | ICD-10-CM

## 2018-08-28 ENCOUNTER — Other Ambulatory Visit: Payer: Self-pay | Admitting: Cardiovascular Disease

## 2018-09-06 ENCOUNTER — Ambulatory Visit
Admission: RE | Admit: 2018-09-06 | Discharge: 2018-09-06 | Disposition: A | Discharge status: Home / Self Care | Payer: Medicare Other | Source: Ambulatory Visit | Attending: Orthopaedic Surgery | Admitting: Orthopaedic Surgery

## 2018-09-06 DIAGNOSIS — M542 Cervicalgia: Secondary | ICD-10-CM

## 2018-11-03 ENCOUNTER — Other Ambulatory Visit: Payer: Self-pay

## 2018-11-03 ENCOUNTER — Emergency Department (HOSPITAL_COMMUNITY): Payer: Medicare Other

## 2018-11-03 ENCOUNTER — Inpatient Hospital Stay (HOSPITAL_COMMUNITY)
Admission: EM | Admit: 2018-11-03 | Discharge: 2018-11-14 | DRG: 872 | Disposition: A | Discharge status: Home / Self Care | Payer: Medicare Other | Attending: Internal Medicine | Admitting: Internal Medicine

## 2018-11-03 ENCOUNTER — Inpatient Hospital Stay (HOSPITAL_COMMUNITY): Payer: Medicare Other

## 2018-11-03 ENCOUNTER — Encounter (HOSPITAL_COMMUNITY): Payer: Self-pay | Admitting: Internal Medicine

## 2018-11-03 DIAGNOSIS — R140 Abdominal distension (gaseous): Secondary | ICD-10-CM

## 2018-11-03 DIAGNOSIS — N390 Urinary tract infection, site not specified: Secondary | ICD-10-CM | POA: Diagnosis present

## 2018-11-03 DIAGNOSIS — G894 Chronic pain syndrome: Secondary | ICD-10-CM | POA: Diagnosis present

## 2018-11-03 DIAGNOSIS — E785 Hyperlipidemia, unspecified: Secondary | ICD-10-CM | POA: Diagnosis present

## 2018-11-03 DIAGNOSIS — N179 Acute kidney failure, unspecified: Secondary | ICD-10-CM | POA: Diagnosis present

## 2018-11-03 DIAGNOSIS — N182 Chronic kidney disease, stage 2 (mild): Secondary | ICD-10-CM | POA: Diagnosis present

## 2018-11-03 DIAGNOSIS — Z8701 Personal history of pneumonia (recurrent): Secondary | ICD-10-CM

## 2018-11-03 DIAGNOSIS — I7409 Other arterial embolism and thrombosis of abdominal aorta: Secondary | ICD-10-CM | POA: Diagnosis present

## 2018-11-03 DIAGNOSIS — A411 Sepsis due to other specified staphylococcus: Principal | ICD-10-CM | POA: Diagnosis present

## 2018-11-03 DIAGNOSIS — Z6831 Body mass index (BMI) 31.0-31.9, adult: Secondary | ICD-10-CM

## 2018-11-03 DIAGNOSIS — M109 Gout, unspecified: Secondary | ICD-10-CM | POA: Diagnosis present

## 2018-11-03 DIAGNOSIS — I714 Abdominal aortic aneurysm, without rupture, unspecified: Secondary | ICD-10-CM | POA: Diagnosis present

## 2018-11-03 DIAGNOSIS — I701 Atherosclerosis of renal artery: Secondary | ICD-10-CM | POA: Diagnosis present

## 2018-11-03 DIAGNOSIS — K56600 Partial intestinal obstruction, unspecified as to cause: Secondary | ICD-10-CM | POA: Diagnosis present

## 2018-11-03 DIAGNOSIS — I129 Hypertensive chronic kidney disease with stage 1 through stage 4 chronic kidney disease, or unspecified chronic kidney disease: Secondary | ICD-10-CM | POA: Diagnosis present

## 2018-11-03 DIAGNOSIS — Z8719 Personal history of other diseases of the digestive system: Secondary | ICD-10-CM

## 2018-11-03 DIAGNOSIS — R1084 Generalized abdominal pain: Secondary | ICD-10-CM

## 2018-11-03 DIAGNOSIS — I472 Ventricular tachycardia: Secondary | ICD-10-CM | POA: Diagnosis not present

## 2018-11-03 DIAGNOSIS — Z8249 Family history of ischemic heart disease and other diseases of the circulatory system: Secondary | ICD-10-CM

## 2018-11-03 DIAGNOSIS — K56609 Unspecified intestinal obstruction, unspecified as to partial versus complete obstruction: Secondary | ICD-10-CM | POA: Diagnosis not present

## 2018-11-03 DIAGNOSIS — E876 Hypokalemia: Secondary | ICD-10-CM | POA: Diagnosis not present

## 2018-11-03 DIAGNOSIS — F419 Anxiety disorder, unspecified: Secondary | ICD-10-CM | POA: Diagnosis present

## 2018-11-03 DIAGNOSIS — G4733 Obstructive sleep apnea (adult) (pediatric): Secondary | ICD-10-CM | POA: Diagnosis present

## 2018-11-03 DIAGNOSIS — Z79891 Long term (current) use of opiate analgesic: Secondary | ICD-10-CM

## 2018-11-03 DIAGNOSIS — Z9049 Acquired absence of other specified parts of digestive tract: Secondary | ICD-10-CM

## 2018-11-03 DIAGNOSIS — Z86718 Personal history of other venous thrombosis and embolism: Secondary | ICD-10-CM

## 2018-11-03 DIAGNOSIS — R109 Unspecified abdominal pain: Secondary | ICD-10-CM

## 2018-11-03 DIAGNOSIS — E669 Obesity, unspecified: Secondary | ICD-10-CM | POA: Diagnosis present

## 2018-11-03 DIAGNOSIS — Z79899 Other long term (current) drug therapy: Secondary | ICD-10-CM

## 2018-11-03 DIAGNOSIS — J449 Chronic obstructive pulmonary disease, unspecified: Secondary | ICD-10-CM | POA: Diagnosis present

## 2018-11-03 DIAGNOSIS — J441 Chronic obstructive pulmonary disease with (acute) exacerbation: Secondary | ICD-10-CM | POA: Diagnosis present

## 2018-11-03 DIAGNOSIS — Z0189 Encounter for other specified special examinations: Secondary | ICD-10-CM

## 2018-11-03 DIAGNOSIS — Z7901 Long term (current) use of anticoagulants: Secondary | ICD-10-CM

## 2018-11-03 DIAGNOSIS — E872 Acidosis: Secondary | ICD-10-CM | POA: Diagnosis present

## 2018-11-03 DIAGNOSIS — Z86711 Personal history of pulmonary embolism: Secondary | ICD-10-CM

## 2018-11-03 DIAGNOSIS — Z87891 Personal history of nicotine dependence: Secondary | ICD-10-CM

## 2018-11-03 DIAGNOSIS — I82409 Acute embolism and thrombosis of unspecified deep veins of unspecified lower extremity: Secondary | ICD-10-CM | POA: Diagnosis present

## 2018-11-03 DIAGNOSIS — I739 Peripheral vascular disease, unspecified: Secondary | ICD-10-CM | POA: Diagnosis present

## 2018-11-03 DIAGNOSIS — Z800 Family history of malignant neoplasm of digestive organs: Secondary | ICD-10-CM

## 2018-11-03 DIAGNOSIS — Z885 Allergy status to narcotic agent status: Secondary | ICD-10-CM

## 2018-11-03 LAB — I-STAT CHEM 8, ED
BUN: 31 mg/dL — AB (ref 8–23)
Calcium, Ion: 1.22 mmol/L (ref 1.15–1.40)
Chloride: 104 mmol/L (ref 98–111)
Creatinine, Ser: 1.9 mg/dL — ABNORMAL HIGH (ref 0.61–1.24)
Glucose, Bld: 153 mg/dL — ABNORMAL HIGH (ref 70–99)
HCT: 56 % — ABNORMAL HIGH (ref 39.0–52.0)
Hemoglobin: 19 g/dL — ABNORMAL HIGH (ref 13.0–17.0)
Potassium: 4.8 mmol/L (ref 3.5–5.1)
Sodium: 136 mmol/L (ref 135–145)
TCO2: 21 mmol/L — ABNORMAL LOW (ref 22–32)

## 2018-11-03 LAB — I-STAT CG4 LACTIC ACID, ED: Lactic Acid, Venous: 1.48 mmol/L (ref 0.5–1.9)

## 2018-11-03 LAB — CBC WITH DIFFERENTIAL/PLATELET
Abs Immature Granulocytes: 0.53 10*3/uL — ABNORMAL HIGH (ref 0.00–0.07)
Basophils Absolute: 0.1 10*3/uL (ref 0.0–0.1)
Basophils Relative: 1 %
Eosinophils Absolute: 0.1 10*3/uL (ref 0.0–0.5)
Eosinophils Relative: 0 %
HCT: 53.7 % — ABNORMAL HIGH (ref 39.0–52.0)
Hemoglobin: 18.4 g/dL — ABNORMAL HIGH (ref 13.0–17.0)
IMMATURE GRANULOCYTES: 3 %
Lymphocytes Relative: 4 %
Lymphs Abs: 0.9 10*3/uL (ref 0.7–4.0)
MCH: 32.9 pg (ref 26.0–34.0)
MCHC: 34.3 g/dL (ref 30.0–36.0)
MCV: 95.9 fL (ref 80.0–100.0)
Monocytes Absolute: 1.3 10*3/uL — ABNORMAL HIGH (ref 0.1–1.0)
Monocytes Relative: 6 %
NEUTROS PCT: 86 %
Neutro Abs: 18.4 10*3/uL — ABNORMAL HIGH (ref 1.7–7.7)
Platelets: 434 10*3/uL — ABNORMAL HIGH (ref 150–400)
RBC: 5.6 MIL/uL (ref 4.22–5.81)
RDW: 14.7 % (ref 11.5–15.5)
WBC: 21.2 10*3/uL — ABNORMAL HIGH (ref 4.0–10.5)
nRBC: 0 % (ref 0.0–0.2)

## 2018-11-03 LAB — LIPASE, BLOOD: Lipase: 36 U/L (ref 11–51)

## 2018-11-03 LAB — COMPREHENSIVE METABOLIC PANEL
ALBUMIN: 4.5 g/dL (ref 3.5–5.0)
ALT: 14 U/L (ref 0–44)
AST: 25 U/L (ref 15–41)
Alkaline Phosphatase: 51 U/L (ref 38–126)
Anion gap: 14 (ref 5–15)
BUN: 28 mg/dL — AB (ref 8–23)
CO2: 19 mmol/L — ABNORMAL LOW (ref 22–32)
Calcium: 10 mg/dL (ref 8.9–10.3)
Chloride: 102 mmol/L (ref 98–111)
Creatinine, Ser: 2.07 mg/dL — ABNORMAL HIGH (ref 0.61–1.24)
GFR calc Af Amer: 36 mL/min — ABNORMAL LOW (ref >60)
GFR calc non Af Amer: 31 mL/min — ABNORMAL LOW (ref >60)
Glucose, Bld: 157 mg/dL — ABNORMAL HIGH (ref 70–99)
Potassium: 4.7 mmol/L (ref 3.5–5.1)
Sodium: 135 mmol/L (ref 135–145)
Total Bilirubin: 1.1 mg/dL (ref 0.3–1.2)
Total Protein: 8.1 g/dL (ref 6.5–8.1)

## 2018-11-03 LAB — PROTIME-INR
INR: 3.23
Prothrombin Time: 32.5 seconds — ABNORMAL HIGH (ref 11.4–15.2)

## 2018-11-03 MED ORDER — SODIUM CHLORIDE 0.9% FLUSH
3.0000 mL | Freq: Two times a day (BID) | INTRAVENOUS | Status: DC
  Administered 2018-11-03 – 2018-11-11 (×9): 3 mL via INTRAVENOUS

## 2018-11-03 MED ORDER — SODIUM CHLORIDE 0.9 % IV BOLUS
1000.0000 mL | Freq: Once | INTRAVENOUS | Status: AC
  Administered 2018-11-03: 1000 mL via INTRAVENOUS

## 2018-11-03 MED ORDER — LACTATED RINGERS IV SOLN
INTRAVENOUS | Status: DC
  Administered 2018-11-03 – 2018-11-04 (×3): via INTRAVENOUS

## 2018-11-03 MED ORDER — ONDANSETRON HCL 4 MG/2ML IJ SOLN
4.0000 mg | Freq: Four times a day (QID) | INTRAMUSCULAR | Status: DC | PRN
  Administered 2018-11-03 – 2018-11-12 (×24): 4 mg via INTRAVENOUS
  Filled 2018-11-03 (×25): qty 2

## 2018-11-03 MED ORDER — DIATRIZOATE MEGLUMINE & SODIUM 66-10 % PO SOLN
90.0000 mL | Freq: Once | ORAL | Status: AC
  Administered 2018-11-04: 90 mL via NASOGASTRIC
  Filled 2018-11-03 (×2): qty 90

## 2018-11-03 MED ORDER — ONDANSETRON HCL 4 MG/2ML IJ SOLN
4.0000 mg | Freq: Once | INTRAMUSCULAR | Status: AC
  Administered 2018-11-03: 4 mg via INTRAVENOUS
  Filled 2018-11-03: qty 2

## 2018-11-03 MED ORDER — LORAZEPAM 2 MG/ML IJ SOLN
0.5000 mg | Freq: Once | INTRAMUSCULAR | Status: AC | PRN
  Administered 2018-11-03: 0.5 mg via INTRAVENOUS
  Filled 2018-11-03: qty 1

## 2018-11-03 MED ORDER — MORPHINE SULFATE (PF) 4 MG/ML IV SOLN
4.0000 mg | Freq: Once | INTRAVENOUS | Status: AC
  Administered 2018-11-03: 4 mg via INTRAVENOUS
  Filled 2018-11-03: qty 1

## 2018-11-03 MED ORDER — PHENOL 1.4 % MT LIQD
1.0000 | OROMUCOSAL | Status: DC | PRN
  Filled 2018-11-03: qty 177

## 2018-11-03 MED ORDER — SODIUM CHLORIDE 0.9 % IV BOLUS: 1000.0000 mL | Freq: Once | INTRAVENOUS | Status: DC

## 2018-11-03 MED ORDER — SODIUM CHLORIDE 0.9 % IV SOLN: INTRAVENOUS | Status: DC

## 2018-11-03 MED ORDER — ONDANSETRON HCL 4 MG PO TABS: 4.0000 mg | ORAL_TABLET | Freq: Four times a day (QID) | ORAL | Status: DC | PRN

## 2018-11-03 MED ORDER — DIPHENHYDRAMINE HCL 50 MG/ML IJ SOLN
25.0000 mg | Freq: Three times a day (TID) | INTRAMUSCULAR | Status: DC | PRN
  Administered 2018-11-04: 25 mg via INTRAVENOUS
  Filled 2018-11-03: qty 1

## 2018-11-03 MED ORDER — IOHEXOL 300 MG/ML  SOLN
100.0000 mL | Freq: Once | INTRAMUSCULAR | Status: AC | PRN
  Administered 2018-11-03: 80 mL via INTRAVENOUS

## 2018-11-03 MED ORDER — FENTANYL CITRATE (PF) 100 MCG/2ML IJ SOLN
12.5000 ug | INTRAMUSCULAR | Status: DC | PRN
  Administered 2018-11-03: 12.5 ug via INTRAVENOUS
  Administered 2018-11-04 – 2018-11-12 (×51): 25 ug via INTRAVENOUS
  Filled 2018-11-03 (×53): qty 2

## 2018-11-03 MED ORDER — ACETAMINOPHEN 325 MG PO TABS
650.0000 mg | ORAL_TABLET | Freq: Four times a day (QID) | ORAL | Status: DC | PRN
  Administered 2018-11-12 – 2018-11-14 (×7): 650 mg via ORAL
  Filled 2018-11-03 (×7): qty 2

## 2018-11-03 MED ORDER — METOPROLOL TARTRATE 5 MG/5ML IV SOLN
5.0000 mg | Freq: Four times a day (QID) | INTRAVENOUS | Status: DC | PRN
  Administered 2018-11-07 – 2018-11-11 (×3): 5 mg via INTRAVENOUS
  Filled 2018-11-03 (×3): qty 5

## 2018-11-03 MED ORDER — ACETAMINOPHEN 650 MG RE SUPP: 650.0000 mg | Freq: Four times a day (QID) | RECTAL | Status: DC | PRN

## 2018-11-03 NOTE — H&P (Signed)
Reason for Consult: vomiting  Tony MinorsJerry F Whidden is an 73 y.o. male.  HPI: 73 yo male with 2 days of vomiting. This started after eating hot dogs. He has been nauseated with abdominal pain since. Pain is diffuse and constant. His last BM was 2 days ago. This last flatus was today. He has not had a bowel obstruction before. Previous surgery includes appendectomy via paramedian incision and hernia repair via transverse middle incision.  Past Medical History:  Diagnosis Date  . AAA (abdominal aortic aneurysm) (HCC)   . Blood dyscrasia    followed by Dr. [name], for clotting issue, has been on Coumadin for about 20 yrs.    [name]. COPD (chronic obstructive pulmonary disease) (HCC)   . DJD (degenerative joint disease)   . Dyslipidemia   . Gout   . Malignant hypertension   . Peripheral vascular disease (HCC)   . Pneumonia 08/2013   pt. reports that he is having this surgery 11/25/2014- for the lung problem that began with pneumonia in 09/2014  . Shortness of breath   . Venous thrombosis    Recurrent    Past Surgical History:  Procedure Laterality Date  . APPENDECTOMY    . BACK SURGERY    . COLONOSCOPY N/A 04/13/2015   Procedure: COLONOSCOPY;  Surgeon: Carman ChingJames Edwards, MD;  [facility];  Service: Endoscopy;  Laterality: N/A;  . EMPYEMA DRAINAGE N/A 11/25/2014   Procedure: EMPYEMA DRAINAGE;  Surgeon: Delight OvensEdward B Gerhardt, MD;  [facility];  Service: Thoracic;  Laterality: N/A;  . ESOPHAGOGASTRODUODENOSCOPY N/A 04/10/2015   Procedure: ESOPHAGOGASTRODUODENOSCOPY (EGD);  Surgeon: Charlott RakesVincent Schooler, MD;  [facility];  Service: Endoscopy;  Laterality: N/A;  . EYE SURGERY     both eyes, cataracts removed, denies lens implants  . FLEXIBLE SIGMOIDOSCOPY N/A 04/12/2015   Procedure: FLEXIBLE SIGMOIDOSCOPY;  Surgeon: Carman ChingJames Edwards, MD;  [facility];  Service: Endoscopy;  Laterality: N/A;  . HERNIA REPAIR    . SHOULDER SURGERY Right   . VIDEO ASSISTED THORACOSCOPY Right 11/25/2014    Procedure: VIDEO ASSISTED THORACOSCOPY;  Surgeon: Delight OvensEdward B Gerhardt, MD;  [facility];  Service: Thoracic;  Laterality: Right;  [name]. VIDEO BRONCHOSCOPY N/A 11/25/2014   Procedure: VIDEO BRONCHOSCOPY;  Surgeon: Delight OvensEdward B Gerhardt, MD;  [facility];  Service: Thoracic;  Laterality: N/A;    Family History  Problem Relation Age of Onset  . CAD Father 2350  . Liver cancer Brother   . CAD Sister     Social History:  reports that he quit smoking about 26 years ago. He has never used smokeless tobacco. He reports that he does not drink alcohol or use drugs.  Allergies:  Allergies  Allergen Reactions  . Codeine Hives and Other (See Comments)    Takes benadryl to stop reaction    Medications: I have reviewed the patient's current medications.  Results for orders placed or performed during the hospital encounter of 11/03/18 (from the past 48 hour(s))  CBC with Differential     Status: Abnormal   Collection Time: 11/03/18  2:20 PM  Result Value Ref Range   WBC 21.2 (H) 4.0 - 10.5 K/uL   RBC 5.60 4.22 - 5.81 MIL/uL   Hemoglobin 18.4 (H) 13.0 - 17.0 g/dL   HCT 16.153.7 (H) 09.639.0 - 04.552.0 %   MCV 95.9 80.0 - 100.0 fL   MCH 32.9 26.0 - 34.0 pg   MCHC 34.3 30.0 - 36.0 g/dL   RDW 40.914.7 81.111.5 - 91.415.5 %  Platelets 434 (H) 150 - 400 K/uL   nRBC 0.0 0.0 - 0.2 %   Neutrophils Relative % 86 %   Neutro Abs 18.4 (H) 1.7 - 7.7 K/uL   Lymphocytes Relative 4 %   Lymphs Abs 0.9 0.7 - 4.0 K/uL   Monocytes Relative 6 %   Monocytes Absolute 1.3 (H) 0.1 - 1.0 K/uL   Eosinophils Relative 0 %   Eosinophils Absolute 0.1 0.0 - 0.5 K/uL   Basophils Relative 1 %   Basophils Absolute 0.1 0.0 - 0.1 K/uL   Immature Granulocytes 3 %   Abs Immature Granulocytes 0.53 (H) 0.00 - 0.07 K/uL    Comment: Performed at [facility], [address], [place], [place] [address]  Comprehensive metabolic panel     Status: Abnormal   Collection Time: 11/03/18  2:20 PM  Result Value Ref Range   Sodium 135 135 - 145 mmol/L     Potassium 4.7 3.5 - 5.1 mmol/L   Chloride 102 98 - 111 mmol/L   CO2 19 (L) 22 - 32 mmol/L   Glucose, Bld 157 (H) 70 - 99 mg/dL   BUN 28 (H) 8 - 23 mg/dL   Creatinine, Ser 5.88 (H) 0.61 - 1.24 mg/dL   Calcium 50.2 8.9 - 77.4 mg/dL   Total Protein 8.1 6.5 - 8.1 g/dL   Albumin 4.5 3.5 - 5.0 g/dL   AST 25 15 - 41 U/L   ALT 14 0 - 44 U/L   Alkaline Phosphatase 51 38 - 126 U/L   Total Bilirubin 1.1 0.3 - 1.2 mg/dL   GFR calc non Af Amer 31 (L) >60 mL/min   GFR calc Af Amer 36 (L) >60 mL/min   Anion gap 14 5 - 15    Comment: Performed at [facility], [address], [place], [place] [address]  Lipase, blood     Status: None   Collection Time: 11/03/18  2:20 PM  Result Value Ref Range   Lipase 36 11 - 51 U/L    Comment: Performed at [facility], [address], [place], [place] [address]  I-Stat Chem 8, ED     Status: Abnormal   Collection Time: 11/03/18  2:41 PM  Result Value Ref Range   Sodium 136 135 - 145 mmol/L   Potassium 4.8 3.5 - 5.1 mmol/L   Chloride 104 98 - 111 mmol/L   BUN 31 (H) 8 - 23 mg/dL   Creatinine, Ser 0.94 (H) 0.61 - 1.24 mg/dL   Glucose, Bld 709 (H) 70 - 99 mg/dL   Calcium, Ion 6.28 3.66 - 1.40 mmol/L   TCO2 21 (L) 22 - 32 mmol/L   Hemoglobin 19.0 (H) 13.0 - 17.0 g/dL   HCT 29.4 (H) 76.5 - 46.5 %  Protime-INR     Status: Abnormal   Collection Time: 11/03/18  4:59 PM  Result Value Ref Range   Prothrombin Time 32.5 (H) 11.4 - 15.2 seconds   INR 3.23     Comment: Performed at [facility], [address] [place], [place] [address]  I-Stat CG4 Lactic Acid, ED     Status: None   Collection Time: 11/03/18  5:41 PM  Result Value Ref Range   Lactic Acid, Venous 1.48 0.5 - 1.9 mmol/L    Ct Abdomen Pelvis W Contrast  Result Date: 11/03/2018 CLINICAL DATA:  Abdominal pain and vomiting.  Abdominal distention. EXAM: CT ABDOMEN AND PELVIS WITH CONTRAST TECHNIQUE: Multidetector CT imaging  of the abdomen and pelvis was performed using the  standard protocol following bolus administration of intravenous contrast. CONTRAST:  80mL OMNIPAQUE IOHEXOL 300 MG/ML  SOLN COMPARISON:  03/05/2018 FINDINGS: Lower chest: No acute abnormalities. Stable scarring at both lung bases. Aortic atherosclerosis. Heart size is normal. Hepatobiliary: Stable 19 mm cyst in the posterior aspect of the dome of the right lobe of the liver. Liver and biliary tree are otherwise normal. Pancreas: Unremarkable. No pancreatic ductal dilatation or surrounding inflammatory changes. Spleen: Normal in size without focal abnormality. Adrenals/Urinary Tract: Adrenal glands are normal. Stable bilateral renal cysts. No hydronephrosis. Bladder is normal. Stomach/Bowel: The stomach and proximal small bowel are markedly distended. Distal small bowel is not distended. Colon is redundant but otherwise normal. Appendix has been removed. 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 Extensive aortic atherosclerosis. 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 Reproductive: Slight enlargement of the prostate gland. Other: No ascites.  No abdominal wall hernias. Musculoskeletal: No acute or significant osseous findings. IMPRESSION: Findings consistent with early complete or partial small bowel obstruction with marked distention of the stomach with dilatation of the proximal jejunum. No discrete transition point. 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 Electronically Signed   By: Francene BoyersJames  Maxwell M.D.   On: 11/03/2018 16:14    Review of Systems  Constitutional: Negative for chills and fever.  HENT: Negative for hearing loss.   Eyes: Negative for blurred vision and double vision.  Respiratory: Negative for cough and hemoptysis.   Cardiovascular: Negative for chest pain and palpitations.  Gastrointestinal: Positive for abdominal pain, nausea and vomiting.  Genitourinary: Negative for dysuria and urgency.   Musculoskeletal: Negative for myalgias and neck pain.  Skin: Negative for itching and rash.  Neurological: Negative for dizziness, tingling and headaches.  Endo/Heme/Allergies: Bruises/bleeds easily.  Psychiatric/Behavioral: Negative for depression and suicidal ideas.   Blood pressure 117/71, pulse (!) 110, temperature 98.4 F (36.9 C), temperature source Oral, resp. rate 20, height 6\' 1"  (1.854 m), weight 106.6 kg, SpO2 99 %. Physical Exam  Vitals reviewed. Constitutional: He is oriented to person, place, and time. He appears well-developed and well-nourished.  HENT:  Head: Normocephalic and atraumatic.  Eyes: Pupils are equal, round, and reactive to light. Conjunctivae and EOM are normal.  Neck: Normal range of motion. Neck supple.  Cardiovascular: Normal rate and regular rhythm.  Respiratory: Effort normal and breath sounds normal.  GI: Soft. Bowel sounds are normal. He exhibits distension. There is abdominal tenderness in the right upper quadrant, right lower quadrant, left upper quadrant and left lower quadrant. No hernia.  Musculoskeletal: Normal range of motion.  Neurological: He is alert and oriented to person, place, and time.  Skin: Skin is warm and dry.  Psychiatric: He has a normal mood and affect. His behavior is normal.      Assessment/Plan: 73 yo male with multiple medical problems on blood thinners presents with distension, abdominal pain and nausea consistent with bowel obstruction. CT shows no mesenteric twisting or hernia and no definitive transition point. WBC 21, Cr elevated from baseline and Hgb 19 consistent with dehydration -NG tube decompression -IV fluid resuscitation -pain control -SBO protocol  De BlanchLuke Aaron Lauree Yurick 11/03/2018, 6:33 PM

## 2018-11-03 NOTE — ED Provider Notes (Signed)
Care was taken over from Dr. Adela Lank.  Patient presents with generalized abdominal pain associated with nausea and vomiting.  CT scan shows evidence of small bowel obstruction.  I spoke with surgery who will see the patient.  They request hospitalist admission.  I spoke with Dr. Allena Katz who will admit the patient for further treatment.  NG tube was ordered.   Rolan Bucco, MD 11/03/18 1730

## 2018-11-03 NOTE — ED Triage Notes (Signed)
Patient  C/O abdominal pain and vomiting that began at 6 pm yesterday. Last BM was 3 days ago. States that the abdominal distention went down after he vomited but it came back again.

## 2018-11-03 NOTE — ED Provider Notes (Signed)
[facility] EMERGENCY DEPARTMENT Provider Note   CSN: [id number] Arrival date & time: 11/03/18  1340     History   Chief Complaint Chief Complaint  Patient presents with  . Abdominal Pain    HPI MALEAK Zhang is a 73 y.o. male.  73 yo M with a chief complaint of diffuse abdominal pain.  Going on for the past couple days.  Associated with some nausea vomiting and constipation.  This started after he ate a hotdog a couple days ago.  He has tried laxatives without improvement.  Denies fevers or chills.  Has had a prior appendectomy previously.  The history is provided by the patient.  Abdominal Pain  Pain location:  Generalized Pain quality: aching and cramping   Pain radiates to:  Does not radiate Pain severity:  Moderate Onset quality:  Gradual Duration:  2 days Timing:  Constant Progression:  Worsening Chronicity:  New Relieved by:  Nothing Worsened by:  Nothing Ineffective treatments:  Antacids and belching Associated symptoms: no chest pain, no chills, no diarrhea, no fever, no shortness of breath and no vomiting     Past Medical History:  Diagnosis Date  . AAA (abdominal aortic aneurysm) (HCC)   . 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    [name] COPD (chronic obstructive pulmonary disease) (HCC)   . DJD (degenerative joint disease)   . Dyslipidemia   . Gout   . Malignant hypertension   . Peripheral vascular disease (HCC)   . Pneumonia 08/2013   pt. reports that he is having this surgery 11/25/2014- for the lung problem that began with pneumonia in 09/2014  . Shortness of breath   . Venous thrombosis    Recurrent    Patient Active Problem List   Diagnosis Date Noted  . History of pulmonary embolism 02/09/2016  . Acute GI bleeding   . Anemia due to blood loss, acute   . GI bleed 04/08/2015  . GIB (gastrointestinal bleeding) 04/08/2015  . AKI (acute kidney injury) (HCC)  04/08/2015  . Empyema lung (HCC)   . Loculated pleural effusion   . Pneumothorax   . Empyema, right (HCC) 11/25/2014  . Persistent right pleural effusion and right lower lobe infiltrate 11/14/2014  . HCAP (healthcare-associated pneumonia) 10/10/2014  . Peripheral vascular disease (HCC) 10/10/2014  . Anticoagulated on Coumadin 10/10/2014  . Supratherapeutic INR 10/10/2014  . Microcytic anemia 09/18/2013  . CAP (community acquired pneumonia) 09/09/2013  . AAA (abdominal aortic aneurysm) (HCC) 08/19/2013  . 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 Malignant HTN with heart disease, w/o CHF, with chronic kidney disease 08/19/2013  . DVT, lower extremity and pulmonary embolism, recurrent 08/19/2013  . Hyperlipidemia 08/19/2013  . Gout 08/19/2013  . COPD (chronic obstructive pulmonary disease) (HCC) 08/19/2013  . OSA (obstructive sleep apnea) 08/19/2013  . Abnormal nuclear stress test 08/19/2013  . Bilateral renal artery stenosis (HCC) 08/19/2013  . DVT (deep venous thrombosis) (HCC) 06/28/2012    Past Surgical History:  Procedure Laterality Date  . APPENDECTOMY    . BACK SURGERY    . COLONOSCOPY N/A 04/13/2015   Procedure: COLONOSCOPY;  Surgeon: Carman Ching, MD;  Location: [facility] ENDOSCOPY;  Service: Endoscopy;  Laterality: N/A;  . EMPYEMA DRAINAGE N/A 11/25/2014   Procedure: EMPYEMA DRAINAGE;  Surgeon: Delight Ovens, MD;  Location: [facility] OR;  Service: Thoracic;  Laterality: N/A;  . ESOPHAGOGASTRODUODENOSCOPY N/A 04/10/2015   Procedure:  ESOPHAGOGASTRODUODENOSCOPY (EGD);  Surgeon: Charlott RakesVincent Schooler, MD;  [facility];  Service: Endoscopy;  Laterality: N/A;  . EYE SURGERY     both eyes, cataracts removed, denies lens implants  . FLEXIBLE SIGMOIDOSCOPY N/A 04/12/2015   Procedure: FLEXIBLE SIGMOIDOSCOPY;  Surgeon: Carman ChingJames Edwards, MD;  [facility];  Service: Endoscopy;  Laterality: N/A;  . HERNIA REPAIR    . SHOULDER SURGERY Right   . VIDEO ASSISTED THORACOSCOPY  Right 11/25/2014   Procedure: VIDEO ASSISTED THORACOSCOPY;  Surgeon: Delight OvensEdward B Gerhardt, MD;  [facility];  Service: Thoracic;  Laterality: Right;  [name]. VIDEO BRONCHOSCOPY N/A 11/25/2014   Procedure: VIDEO BRONCHOSCOPY;  Surgeon: Delight OvensEdward B Gerhardt, MD;  [facility];  Service: Thoracic;  Laterality: N/A;        Home Medications    Prior to Admission medications   Medication Sig Start Date End Date Taking? Authorizing Provider  acetaminophen (TYLENOL 8 HOUR ARTHRITIS PAIN) 650 MG CR tablet Take 650 mg by mouth See admin instructions. Take 350 mg  at 12 pm and 5 pm 03/05/18    [provider]  allopurinol (ZYLOPRIM) 300 MG tablet Take 1 tablet (300 mg total) by mouth daily. Start 04/21/15 04/21/15   Catarina Hartshornat, David, MD  BYSTOLIC 10 MG tablet TAKE 1 TABLET BY MOUTH DAILY 05/30/17   Croitoru, Rachelle HoraMihai, MD  cilostazol (PLETAL) 100 MG tablet Take 1 tablet (100 mg total) by mouth 2 (two) times daily. Please schedule appointment for refills. 03/07/17   Croitoru, Mihai, MD  fenofibrate 160 MG tablet Take 1 tablet (160 mg total) by mouth daily. Please schedule appointment for further refills 07/28/18   Croitoru, Mihai, MD  HYDROcodone-acetaminophen (NORCO) 10-325 MG per tablet Take 1 tablet by mouth every 4 (four) hours as needed for severe pain. Patient taking differently: Take 1 tablet by mouth every 4 (four) hours.  11/30/14   Ardelle BallsZimmerman, Donielle M, PA-C  losartan-hydrochlorothiazide (HYZAAR) 100-25 MG tablet Take 1 tablet by mouth daily. NEEDS APPOINTMENT FOR FUTURE REFILLS 10/31/17   Croitoru, Rachelle HoraMihai, MD  methocarbamol (ROBAXIN) 500 MG tablet Take 1 tablet (500 mg total) by mouth every 8 (eight) hours as needed for muscle spasms. 03/05/18   Loren RacerYelverton, David, MD  oxyCODONE-acetaminophen (PERCOCET) 5-325 MG tablet Take 1 tablet by mouth every 6 (six) hours as needed for severe pain. 03/05/18   Loren RacerYelverton, David, MD  pantoprazole (PROTONIX) 40 MG tablet Take 1 tablet (40 mg total) by mouth every 12 (twelve)  hours. 04/13/15   Catarina Hartshornat, David, MD  PROAIR HFA 108 (90 BASE) MCG/ACT inhaler Inhale 3-4 puffs into the lungs every 4 (four) hours as needed for wheezing or shortness of breath.  10/31/14   [provider]  simvastatin (ZOCOR) 40 MG tablet Take 1 tablet (40 mg total) by mouth daily at 6 PM. Need appointment for refills 05/10/17   Croitoru, Mihai, MD  warfarin (COUMADIN) 2 MG tablet Take 1 tablet (2 mg total) by mouth daily. Patient taking differently: Take 2 mg by mouth daily at 6 PM. Take 7 days a week 08/14/15   Ladene ArtistSherrill, Gary B, MD  warfarin (COUMADIN) 2.5 MG tablet TAKE 1 TABLET BY MOUTH DAILY OR AS DIRECTED Patient not taking: Reported on 03/05/2018 03/05/15   Ladene ArtistSherrill, Gary B, MD    Family History No family history on file.  Social History Social History   Tobacco Use  . Smoking status: Former Smoker    Last attempt to quit: 10/18/1992    Years since quitting: 26.0  . Smokeless  tobacco: Never Used  Substance Use Topics  . Alcohol use: No  . Drug use: No     Allergies   Codeine   Review of Systems Review of Systems  Constitutional: Negative for chills and fever.  HENT: Negative for congestion and facial swelling.   Eyes: Negative for discharge and visual disturbance.  Respiratory: Negative for shortness of breath.   Cardiovascular: Negative for chest pain and palpitations.  Gastrointestinal: Positive for abdominal pain. Negative for diarrhea and vomiting.  Musculoskeletal: Negative for arthralgias and myalgias.  Skin: Negative for color change and rash.  Neurological: Negative for tremors, syncope and headaches.  Psychiatric/Behavioral: Negative for confusion and dysphoric mood.     Physical Exam Updated Vital Signs BP 117/71 (BP Location: Right Arm)   Pulse (!) 110   Temp 98.4 F (36.9 C) (Oral)   Resp 20   Ht 6\' 1"  (1.854 m)   Wt 106.6 kg   SpO2 99%   BMI 31.00 kg/m   Physical Exam Vitals signs and nursing note reviewed.  Constitutional:       Appearance: He is well-developed.  HENT:     Head: Normocephalic and atraumatic.  Eyes:     Pupils: Pupils are equal, round, and reactive to light.  Neck:     Musculoskeletal: Normal range of motion and neck supple.     Vascular: No JVD.  Cardiovascular:     Rate and Rhythm: Normal rate and regular rhythm.     Heart sounds: No murmur. No friction rub. No gallop.   Pulmonary:     Effort: No respiratory distress.     Breath sounds: No wheezing.  Abdominal:     General: There is distension.     Tenderness: There is no guarding or rebound.     Comments: Diffusely distended abdomen, tympanitic to percussion  Musculoskeletal: Normal range of motion.  Skin:    Coloration: Skin is not pale.     Findings: No rash.  Neurological:     Mental Status: He is alert and oriented to person, place, and time.  Psychiatric:        Behavior: Behavior normal.      ED Treatments / Results  Labs (all labs ordered are listed, but only abnormal results are displayed) Labs Reviewed  CBC WITH DIFFERENTIAL/PLATELET - Abnormal; Notable for the following components:      Result Value   WBC 21.2 (*)    Hemoglobin 18.4 (*)    HCT 53.7 (*)    Platelets 434 (*)    Neutro Abs 18.4 (*)    Monocytes Absolute 1.3 (*)    Abs Immature Granulocytes 0.53 (*)    All other components within normal limits  COMPREHENSIVE METABOLIC PANEL - Abnormal; Notable for the following components:   CO2 19 (*)    Glucose, Bld 157 (*)    BUN 28 (*)    Creatinine, Ser 2.07 (*)    GFR calc non Af Amer 31 (*)    GFR calc Af Amer 36 (*)    All other components within normal limits  I-STAT CHEM 8, ED - Abnormal; Notable for the following components:   BUN 31 (*)    Creatinine, Ser 1.90 (*)    Glucose, Bld 153 (*)    TCO2 21 (*)    Hemoglobin 19.0 (*)    HCT 56.0 (*)    All other components within normal limits  LIPASE, BLOOD    EKG EKG Interpretation  Date/Time:  Friday November 03 2018 13:55:25 EST Ventricular  Rate:  111 PR Interval:    QRS Duration: 97 QT Interval:  332 QTC Calculation: 452 R Axis:   -84 Text Interpretation:  Sinus tachycardia RVH with secondary repolarization abnrm Inferior infarct, old Lateral leads are also involved Since last tracing rate faster st changes likely rate related Confirmed by Melene PlanFloyd, Patrick Salemi [phone]([id number]) on 11/03/2018 2:09:21 PM   Radiology No results found.  Procedures Procedures (including critical care time)  Medications Ordered in ED Medications  morphine 4 MG/ML injection 4 mg (has no administration in time range)  ondansetron (ZOFRAN) injection 4 mg (has no administration in time range)  sodium chloride 0.9 % bolus 1,000 mL (1,000 mLs Intravenous New Bag/Given 11/03/18 1434)  iohexol (OMNIPAQUE) 300 MG/ML solution 100 mL (80 mLs Intravenous Contrast Given 11/03/18 1600)     Initial Impression / Assessment and Plan / ED Course  I have reviewed the triage vital signs and the nursing notes.  Pertinent labs & imaging results that were available during my care of the patient were reviewed by me and considered in my medical decision making (see chart for details).     73 yo M with a chief complaint of diffuse abdominal pain nausea vomiting and constipation.  Concern for possible small bowel obstruction obtain lab work and CT scan.  Lab work with significant leukocytosis of 21,000.  Patient has a mild rise in his creatinine up to 2.  Given a bolus of fluids nausea and pain medicine.  I discussed the case and turned the patient over to Dr. Fredderick PhenixBelfi, please see her note for further details of care in the ED.  The patients results and plan were reviewed and discussed.   Any x-rays performed were independently reviewed by myself.   Differential diagnosis were considered with the presenting HPI.  Medications  morphine 4 MG/ML injection 4 mg (has no administration in time range)  ondansetron (ZOFRAN) injection 4 mg (has no administration in time range)  sodium  chloride 0.9 % bolus 1,000 mL (1,000 mLs Intravenous New Bag/Given 11/03/18 1434)  iohexol (OMNIPAQUE) 300 MG/ML solution 100 mL (80 mLs Intravenous Contrast Given 11/03/18 1600)    Vitals:   11/03/18 1350 11/03/18 1406  BP:  117/71  Pulse:  (!) 110  Resp:  20  Temp:  98.4 F (36.9 C)  TempSrc:  Oral  SpO2:  99%  Weight: 106.6 kg   Height: 6\' 1"  (1.854 m)     Final diagnoses:  Generalized abdominal pain    Admission/ observation were discussed with the admitting physician, patient and/or family and they are comfortable with the plan.    Final Clinical Impressions(s) / ED Diagnoses   Final diagnoses:  Generalized abdominal pain    ED Discharge Orders    None       Melene PlanFloyd, Joyanne Eddinger, DO 11/03/18 1601

## 2018-11-03 NOTE — H&P (Signed)
[facility] History and Physical   Patient: Tony Zhang ZOX:[id number]   PCP: [name], MD DOB: 1946-05-19   DOA: 11/03/2018   DOS: 11/03/2018   DOS: the patient was seen and examined on 11/03/2018  Patient coming from: The patient is coming from home.  Chief Complaint: abdominal pain  HPI: Tony Zhang is a 73 y.o. 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 Patient presents with complaints of 3-day history of nausea and vomiting.Initially started having nausea and vomiting followed by abdominal distention as well as abdominal pain.  He tried laxatives, over-the-counter acid suppressant without any benefit.  He was unable to eat anything by mouth. He continues to have dry heaving throughout the day today and the abdominal pain was worsening and therefore he decided to come to the hospital. Reports he does not have any appetite.  Still has nausea but no vomiting.  Abdominal pain is diffuse constant and sharp.  Passing gas this morning.  Breathing okay.  No dizziness lightheadedness no focal deficit. Denies any alcohol abuse, drug abuse or active smoking. Remains compliant with his home medication.  ED Course: Presents with abdominal pain, CT scan of the abdomen is positive for small bowel obstruction.  General surgery was consulted and the patient was referred for admission to hospital service.  At his baseline ambulates without support And is independent for most of his ADL; manages his medication on his own.  Review of Systems: as mentioned in the history of present illness.  All other systems reviewed and are negative.  Past Medical History:  Diagnosis Date  . AAA (abdominal aortic aneurysm) (HCC)   . 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    [name] COPD (chronic obstructive pulmonary disease) (HCC)   . DJD (degenerative  joint disease)   . Dyslipidemia   . Gout   . Malignant hypertension   . Peripheral vascular disease (HCC)   . Pneumonia 08/2013   pt. reports that he is having this surgery 11/25/2014- for the lung problem that began with pneumonia in 09/2014  . Shortness of breath   . Venous thrombosis    Recurrent   Past Surgical History:  Procedure Laterality Date  . APPENDECTOMY    . BACK SURGERY    . COLONOSCOPY N/A 04/13/2015   Procedure: COLONOSCOPY;  Surgeon: Carman Ching, MD;  Location: [facility] ENDOSCOPY;  Service: Endoscopy;  Laterality: N/A;  . EMPYEMA DRAINAGE N/A 11/25/2014   Procedure: EMPYEMA DRAINAGE;  Surgeon: Delight Ovens, MD;  Location: [facility] OR;  Service: Thoracic;  Laterality: N/A;  . ESOPHAGOGASTRODUODENOSCOPY N/A 04/10/2015   Procedure: ESOPHAGOGASTRODUODENOSCOPY (EGD);  Surgeon: Charlott Rakes, MD;  Location: [facility] ENDOSCOPY;  Service: Endoscopy;  Laterality: N/A;  . EYE SURGERY     both eyes, cataracts removed, denies lens implants  . FLEXIBLE SIGMOIDOSCOPY N/A 04/12/2015   Procedure: FLEXIBLE SIGMOIDOSCOPY;  Surgeon: Carman Ching, MD;  Location: [facility] ENDOSCOPY;  Service: Endoscopy;  Laterality: N/A;  . HERNIA REPAIR    . SHOULDER SURGERY Right   . VIDEO ASSISTED THORACOSCOPY Right 11/25/2014   Procedure: VIDEO ASSISTED THORACOSCOPY;  Surgeon: Delight Ovens, MD;  Location: [facility] OR;  Service: Thoracic;  Laterality: Right;  [name] VIDEO BRONCHOSCOPY N/A 11/25/2014   Procedure: VIDEO BRONCHOSCOPY;  Surgeon: Delight Ovens, MD;  Location: [facility] OR;  Service: Thoracic;  Laterality: N/A;   Social History:  reports that he quit smoking about 26 years ago. He has never used smokeless tobacco. He reports that he does not drink alcohol or use drugs.  Allergies  Allergen Reactions  . Codeine Hives and Other (See Comments)    Takes benadryl to stop reaction    Family History  Problem Relation Age of Onset  . CAD Father 34  . Liver cancer Brother   . CAD Sister      Prior to Admission  medications   Medication Sig Start Date End Date Taking? Authorizing Provider  acetaminophen (TYLENOL 8 HOUR ARTHRITIS PAIN) 650 MG CR tablet Take 650 mg by mouth See admin instructions. Take 350 mg  at 12 pm and 5 pm 03/05/18   Yes [provider]  allopurinol (ZYLOPRIM) 300 MG tablet Take 1 tablet (300 mg total) by mouth daily. Start 04/21/15 04/21/15  Yes Tat, Onalee Hua, MD  BYSTOLIC 10 MG tablet TAKE 1 TABLET BY MOUTH DAILY Patient taking differently: Take 10 mg by mouth daily.  05/30/17  Yes Croitoru, Mihai, MD  cilostazol (PLETAL) 100 MG tablet Take 1 tablet (100 mg total) by mouth 2 (two) times daily. Please schedule appointment for refills. 03/07/17  Yes Croitoru, Mihai, MD  fenofibrate 160 MG tablet Take 1 tablet (160 mg total) by mouth daily. Please schedule appointment for further refills 07/28/18  Yes Croitoru, Mihai, MD  HYDROcodone-acetaminophen (NORCO) 10-325 MG per tablet Take 1 tablet by mouth every 4 (four) hours as needed for severe pain. Patient taking differently: Take 1 tablet by mouth every 4 (four) hours as needed.  11/30/14  Yes Doree Fudge M, PA-C  losartan-hydrochlorothiazide (HYZAAR) 100-25 MG tablet Take 1 tablet by mouth daily. NEEDS APPOINTMENT FOR FUTURE REFILLS 10/31/17  Yes Croitoru, Mihai, MD  methocarbamol (ROBAXIN) 500 MG tablet Take 1 tablet (500 mg total) by mouth every 8 (eight) hours as needed for muscle spasms. 03/05/18  Yes Loren Racer, MD  pantoprazole (PROTONIX) 40 MG tablet Take 1 tablet (40 mg total) by mouth every 12 (twelve) hours. Patient taking differently: Take 40 mg by mouth 2 (two) times daily.  04/13/15  Yes Tat, Onalee Hua, MD  PROAIR HFA 108 (90 BASE) MCG/ACT inhaler Inhale 3-4 puffs into the lungs every 4 (four) hours as needed for wheezing or shortness of breath.  10/31/14  Yes [provider]  simvastatin (ZOCOR) 40 MG tablet Take 1 tablet (40 mg total) by mouth daily at 6 PM. Need appointment for refills 05/10/17  Yes Croitoru,  Mihai, MD  warfarin (COUMADIN) 2 MG tablet Take 1 tablet (2 mg total) by mouth daily. Patient taking differently: Take 2 mg by mouth daily. Take 7 days a week 08/14/15  Yes Ladene Artist, MD    Physical Exam: Vitals:   11/03/18 1350 11/03/18 1406  BP:  117/71  Pulse:  (!) 110  Resp:  20  Temp:  98.4 F (36.9 C)  TempSrc:  Oral  SpO2:  99%  Weight: 106.6 kg   Height: 6\' 1"  (1.854 m)     General: Alert, Awake and Oriented to Time, Place and Person. Appear in marked distress, affect appropriate Eyes: PERRL, Conjunctiva normal ENT: Oral Mucosa clear moist. Neck: difficult to assess  JVD, no Abnormal Mass Or lumps Cardiovascular: S1 and S2 Present, no Murmur, Peripheral Pulses Present Respiratory: normal respiratory effort, Bilateral Air entry equal and Decreased, no use of accessory muscle, Clear to Auscultation, no Crackles, no wheezes Abdomen: Bowel Sound present, Soft and distended, diffuse tenderness even to mild  palpation, no hernia Skin: no redness, no Rash, no induration Extremities: trace Pedal edema, no calf tenderness Neurologic: Grossly no focal neuro deficit. Bilaterally Equal motor strength  Labs on Admission:  CBC: Recent Labs  Lab 11/03/18 1420 11/03/18 1441  WBC 21.2*  --   NEUTROABS 18.4*  --   HGB 18.4* 19.0*  HCT 53.7* 56.0*  MCV 95.9  --   PLT 434*  --    Basic Metabolic Panel: Recent Labs  Lab 11/03/18 1420 11/03/18 1441  NA 135 136  K 4.7 4.8  CL 102 104  CO2 19*  --   GLUCOSE 157* 153*  BUN 28* 31*  CREATININE 2.07* 1.90*  CALCIUM 10.0  --    GFR: Estimated Creatinine Clearance: 44.4 mL/min (A) (by C-G formula based on SCr of 1.9 mg/dL (H)). Liver Function Tests: Recent Labs  Lab 11/03/18 1420  AST 25  ALT 14  ALKPHOS 51  BILITOT 1.1  PROT 8.1  ALBUMIN 4.5   Recent Labs  Lab 11/03/18 1420  LIPASE 36   No results for input(s): AMMONIA in the last 168 hours. Coagulation Profile: No results for input(s): INR, PROTIME in  the last 168 hours. Cardiac Enzymes: No results for input(s): CKTOTAL, CKMB, CKMBINDEX, TROPONINI in the last 168 hours. BNP (last 3 results) No results for input(s): PROBNP in the last 8760 hours. HbA1C: No results for input(s): HGBA1C in the last 72 hours. CBG: No results for input(s): GLUCAP in the last 168 hours. Lipid Profile: No results for input(s): CHOL, HDL, LDLCALC, TRIG, CHOLHDL, LDLDIRECT in the last 72 hours. Thyroid Function Tests: No results for input(s): TSH, T4TOTAL, FREET4, T3FREE, THYROIDAB in the last 72 hours. Anemia Panel: No results for input(s): VITAMINB12, FOLATE, FERRITIN, TIBC, IRON, RETICCTPCT in the last 72 hours. Urine analysis:    Component Value Date/Time   COLORURINE YELLOW 04/08/2015 2033   APPEARANCEUR CLEAR 04/08/2015 2033   LABSPEC 1.018 04/08/2015 2033   PHURINE 5.5 04/08/2015 2033   GLUCOSEU NEGATIVE 04/08/2015 2033   HGBUR NEGATIVE 04/08/2015 2033   BILIRUBINUR NEGATIVE 04/08/2015 2033   KETONESUR NEGATIVE 04/08/2015 2033   PROTEINUR NEGATIVE 04/08/2015 2033   UROBILINOGEN 0.2 04/08/2015 2033   NITRITE NEGATIVE 04/08/2015 2033   LEUKOCYTESUR NEGATIVE 04/08/2015 2033    Radiological Exams on Admission: Ct Abdomen Pelvis W Contrast  Result Date: 11/03/2018 CLINICAL DATA:  Abdominal pain and vomiting.  Abdominal distention. EXAM: CT ABDOMEN AND PELVIS WITH CONTRAST TECHNIQUE: Multidetector CT imaging of the abdomen and pelvis was performed using the standard protocol following bolus administration of intravenous contrast. CONTRAST:  80mL OMNIPAQUE IOHEXOL 300 MG/ML  SOLN COMPARISON:  03/05/2018 FINDINGS: Lower chest: No acute abnormalities. Stable scarring at both lung bases. Aortic atherosclerosis. Heart size is normal. Hepatobiliary: Stable 19 mm cyst in the posterior aspect of the dome of the right lobe of the liver. Liver and biliary tree are otherwise normal. Pancreas: Unremarkable. No pancreatic ductal dilatation or surrounding  inflammatory changes. Spleen: Normal in size without focal abnormality. Adrenals/Urinary Tract: Adrenal glands are normal. Stable bilateral renal cysts. No hydronephrosis. Bladder is normal. Stomach/Bowel: The stomach and proximal small bowel are markedly distended. Distal small bowel is not distended. Colon is redundant but otherwise normal. Appendix has been removed. 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 Extensive aortic atherosclerosis. 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 Reproductive: Slight enlargement of the prostate gland. Other: No ascites.  No abdominal wall hernias.  Musculoskeletal: No acute or significant osseous findings. IMPRESSION: Findings consistent with early complete or partial small bowel obstruction with marked distention of the stomach with dilatation of the proximal jejunum. No discrete transition point. 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 Electronically Signed   By: Francene Boyers M.D.   On: 11/03/2018 16:14   EKG: Independently reviewed. nonspecific ST and T waves changes, sinus tachycardia.  Assessment/Plan 1. Small bowel obstruction (HCC) Presents with abdominal pain and distention. CT scan of the abdomen shows small bowel obstruction with market distention of stomach and jejunum. General surgery is consulted. Maintain n.p.o. Supportive measures with IV fluid, IV Zofran, IV fentanyl. Significant leukocytosis as well as acute kidney injury.  Management per surgery. No transition point on CT scan. NG tube ordered.  Patient reports significant anxiety with consent of NG insertion and therefore will provide 1 dose of IV Ativan.  2.  Acute kidney injury. Non-anion gap metabolic acidosis. Presents with nausea vomiting as well as small bowel obstruction. Has acute kidney injury with creatinine of 2. Low bicarb suggesting metabolic acidosis. Lactic acid normal. Continue  to monitor. Prefer LR as a choice of fluid here secondary to above.  3.  Recurrent DVT and PE. Patient is on chronic anticoagulation with warfarin. INR currently pending. We will currently hold off on warfarin. Further management with transition to heparin depend on surgical plan.  4.  Peripheral vascular disease. Patient is on Pletal at home. Currently on hold as patient is n.p.o. Monitor.  5.  History of COPD. Continue inhalers. Does not appear to be having any exacerbation right now.  6.Body mass index is 31 kg/m.  Obesity. OSA. Will discussed the patient regarding use of CPAP outpatient I do not see any documentation.  7. Pain control  - [facility] Controlled Substance Reporting System database was reviewed. - Last prescription for controlled substance was on December 2019 for Norco. -Continue here in the hospital once patient is able to take p.o.  Nutrition: NPO DVT Prophylaxis: on therapeutic anticoagulation.  Advance goals of care discussion: Full code Patient want his sister and brother-in-law [name] to be healthcare proxy. In the past the patient had partial code with no CPR or shocks, currently he wants to try resuscitation once before becoming a full DNR.   Consults: General surgery   Family Communication: no family was present at bedside, at the time of interview.  Disposition: Admitted as inpatient, telemetry unit. Likely to be discharged home, in 4 days.  Severity of Illness: The appropriate patient status for this patient is INPATIENT. Inpatient status is judged to be reasonable and necessary in order to provide the required intensity of service to ensure the patient's safety. The patient's presenting symptoms, physical exam findings, and initial radiographic and laboratory data in the context of their chronic comorbidities is felt to place them at high risk for further clinical deterioration. Furthermore, it is not anticipated that the patient will  be medically stable for discharge from the hospital within 2 midnights of admission. The following factors support the patient status of inpatient.   " The patient's presenting symptoms include abdominal pain nausea and vomiting. " The worrisome physical exam findings include abdominal distention, hypotension. " The initial radiographic and laboratory data are worrisome because of leukocytosis, acute kidney injury, CT scan showing small bowel obstruction. " The chronic co-morbidities include recurrent PE on anticoagulation, recurrent pneumonia, morbid obesity, OSA Body mass index is 31 kg/m. .   * I certify that at the  point of admission it is my clinical judgment that the patient will require inpatient hospital care spanning beyond 2 midnights from the point of admission due to high intensity of service, high risk for further deterioration and high frequency of surveillance required.*    Author: Lynden OxfordPranav Patel, MD [facility] 11/03/2018  If 7PM-7AM, please contact night-coverage [email or web address]

## 2018-11-04 ENCOUNTER — Inpatient Hospital Stay (HOSPITAL_COMMUNITY): Payer: Medicare Other

## 2018-11-04 LAB — COMPREHENSIVE METABOLIC PANEL
ALT: 13 U/L (ref 0–44)
AST: 16 U/L (ref 15–41)
Albumin: 3.4 g/dL — ABNORMAL LOW (ref 3.5–5.0)
Alkaline Phosphatase: 49 U/L (ref 38–126)
Anion gap: 12 (ref 5–15)
BUN: 32 mg/dL — ABNORMAL HIGH (ref 8–23)
CO2: 17 mmol/L — ABNORMAL LOW (ref 22–32)
Calcium: 8.3 mg/dL — ABNORMAL LOW (ref 8.9–10.3)
Chloride: 108 mmol/L (ref 98–111)
Creatinine, Ser: 1.75 mg/dL — ABNORMAL HIGH (ref 0.61–1.24)
GFR calc Af Amer: 44 mL/min — ABNORMAL LOW (ref >60)
GFR calc non Af Amer: 38 mL/min — ABNORMAL LOW (ref >60)
Glucose, Bld: 135 mg/dL — ABNORMAL HIGH (ref 70–99)
Potassium: 3.5 mmol/L (ref 3.5–5.1)
Sodium: 137 mmol/L (ref 135–145)
Total Bilirubin: 1 mg/dL (ref 0.3–1.2)
Total Protein: 6.5 g/dL (ref 6.5–8.1)

## 2018-11-04 LAB — CBC WITH DIFFERENTIAL/PLATELET
Abs Immature Granulocytes: 0.2 10*3/uL — ABNORMAL HIGH (ref 0.00–0.07)
Basophils Absolute: 0.1 10*3/uL (ref 0.0–0.1)
Basophils Relative: 0 %
EOS ABS: 0.1 10*3/uL (ref 0.0–0.5)
Eosinophils Relative: 1 %
HCT: 42.7 % (ref 39.0–52.0)
Hemoglobin: 14.2 g/dL (ref 13.0–17.0)
Immature Granulocytes: 1 %
Lymphocytes Relative: 6 %
Lymphs Abs: 0.8 10*3/uL (ref 0.7–4.0)
MCH: 31.3 pg (ref 26.0–34.0)
MCHC: 33.3 g/dL (ref 30.0–36.0)
MCV: 94.3 fL (ref 80.0–100.0)
Monocytes Absolute: 1 10*3/uL (ref 0.1–1.0)
Monocytes Relative: 7 %
Neutro Abs: 11.7 10*3/uL — ABNORMAL HIGH (ref 1.7–7.7)
Neutrophils Relative %: 85 %
PLATELETS: 246 10*3/uL (ref 150–400)
RBC: 4.53 MIL/uL (ref 4.22–5.81)
RDW: 14.6 % (ref 11.5–15.5)
WBC: 13.9 10*3/uL — AB (ref 4.0–10.5)
nRBC: 0 % (ref 0.0–0.2)

## 2018-11-04 LAB — CBC
HCT: 41.3 % (ref 39.0–52.0)
Hemoglobin: 13.9 g/dL (ref 13.0–17.0)
MCH: 32.3 pg (ref 26.0–34.0)
MCHC: 33.7 g/dL (ref 30.0–36.0)
MCV: 95.8 fL (ref 80.0–100.0)
Platelets: 256 10*3/uL (ref 150–400)
RBC: 4.31 MIL/uL (ref 4.22–5.81)
RDW: 14.7 % (ref 11.5–15.5)
WBC: 12.4 10*3/uL — ABNORMAL HIGH (ref 4.0–10.5)
nRBC: 0 % (ref 0.0–0.2)

## 2018-11-04 LAB — MAGNESIUM
MAGNESIUM: 1.3 mg/dL — AB (ref 1.7–2.4)
MAGNESIUM: 2.4 mg/dL (ref 1.7–2.4)

## 2018-11-04 LAB — BASIC METABOLIC PANEL
Anion gap: 10 (ref 5–15)
BUN: 28 mg/dL — ABNORMAL HIGH (ref 8–23)
CO2: 19 mmol/L — ABNORMAL LOW (ref 22–32)
Calcium: 8 mg/dL — ABNORMAL LOW (ref 8.9–10.3)
Chloride: 105 mmol/L (ref 98–111)
Creatinine, Ser: 1.48 mg/dL — ABNORMAL HIGH (ref 0.61–1.24)
GFR calc non Af Amer: 46 mL/min — ABNORMAL LOW (ref >60)
GFR, EST AFRICAN AMERICAN: 54 mL/min — AB (ref >60)
Glucose, Bld: 112 mg/dL — ABNORMAL HIGH (ref 70–99)
Potassium: 3.4 mmol/L — ABNORMAL LOW (ref 3.5–5.1)
Sodium: 134 mmol/L — ABNORMAL LOW (ref 135–145)

## 2018-11-04 LAB — PROTIME-INR
INR: 2.95
Prothrombin Time: 30.3 seconds — ABNORMAL HIGH (ref 11.4–15.2)

## 2018-11-04 LAB — LACTIC ACID, PLASMA: Lactic Acid, Venous: 0.9 mmol/L (ref 0.5–1.9)

## 2018-11-04 MED ORDER — POTASSIUM CHLORIDE 10 MEQ/100ML IV SOLN: 10.0000 meq | INTRAVENOUS | Status: DC

## 2018-11-04 MED ORDER — STERILE WATER FOR INJECTION IV SOLN
INTRAVENOUS | Status: DC
  Administered 2018-11-04: 14:00:00 via INTRAVENOUS
  Filled 2018-11-04 (×2): qty 850

## 2018-11-04 MED ORDER — POTASSIUM CHLORIDE 10 MEQ/100ML IV SOLN
10.0000 meq | INTRAVENOUS | Status: AC
  Administered 2018-11-04 (×2): 10 meq via INTRAVENOUS
  Filled 2018-11-04 (×2): qty 100

## 2018-11-04 MED ORDER — MAGNESIUM SULFATE 4 GM/100ML IV SOLN
4.0000 g | Freq: Once | INTRAVENOUS | Status: AC
  Administered 2018-11-04: 4 g via INTRAVENOUS
  Filled 2018-11-04: qty 100

## 2018-11-04 NOTE — Progress Notes (Signed)
ANTICOAGULATION CONSULT NOTE - Initial Consult  Pharmacy Consult for Heparin Indication: recurrent PE/DVTs, when INR <2  Allergies  Allergen Reactions  . Codeine Hives and Other (See Comments)    Takes benadryl to stop reaction    Patient Measurements: Height: 6\' 1"  (185.4 cm) Weight: 234 lb 2.1 oz (106.2 kg) IBW/kg (Calculated) : 79.9 Heparin Dosing Weight: 102 kg  Vital Signs: BP: 102/71 (01/18 0555) Pulse Rate: 99 (01/18 0555)  Labs: Recent Labs    11/03/18 1420 11/03/18 1441 11/03/18 1659 11/04/18 0315  HGB 18.4* 19.0*  --  14.2  HCT 53.7* 56.0*  --  42.7  PLT 434*  --   --  246  LABPROT  --   --  32.5* 30.3*  INR  --   --  3.23 2.95  CREATININE 2.07* 1.90*  --  1.75*    Estimated Creatinine Clearance: 48.1 mL/min (A) (by C-G formula based on SCr of 1.75 mg/dL (H)).   Medical History: Past Medical History:  Diagnosis Date  . AAA (abdominal aortic aneurysm) (HCC)   . 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    [name] COPD (chronic obstructive pulmonary disease) (HCC)   . DJD (degenerative joint disease)   . Dyslipidemia   . Gout   . Malignant hypertension   . Peripheral vascular disease (HCC)   . Pneumonia 08/2013   pt. reports that he is having this surgery 11/25/2014- for the lung problem that began with pneumonia in 09/2014  . Shortness of breath   . Venous thrombosis    Recurrent     Assessment: 73 year old male on chronic Coumadin for recurrent DVT and PE now NPO due to SBO. Pharmacy consulted to start IV Heparin as bridge while NPO.   Currently INR elevated in therapeutic range at 2.95. Will hold Heparin start until INR <2. Last dose Coumadin was 11/02/18. Home regimen was 7mg  daily.   Goal of Therapy:  INR 2-3 Heparin level 0.3-0.7 units/ml Monitor platelets by anticoagulation protocol: Yes   Plan:  Hold IV Heparin for now - start when INR <2.  Follow-up po status and ability to  resume Coumadin. Daily PT/INR, CBC  Fayne Norrie 11/04/2018,1:34 PM

## 2018-11-04 NOTE — Progress Notes (Signed)
Pt had multiple liquidy, mushy and brownish bowel movement overnight. Pain and nausea decreased per patient. Will encourage pt to ambulate today.

## 2018-11-04 NOTE — Progress Notes (Signed)
Patient ID: Tony Zhang, male   DOB: [birth year], 73 y.o.   MRN: [id number] [facility] Surgery Progress Note:   * No surgery found *  Subjective: Mental status is alert;  Passing some stool but still distended and uncomfortable Objective: Vital signs in last 24 hours: Temp:  [97.5 F (36.4 C)-98.4 F (36.9 C)] 97.5 F (36.4 C) (01/17 2043) Pulse Rate:  [96-110] 99 (01/18 0555) Resp:  [17-20] 18 (01/18 0555) BP: (102-161)/(55-74) 102/71 (01/18 0555) SpO2:  [94 %-100 %] 95 % (01/18 0555) Weight:  [106.2 kg-106.6 kg] 106.2 kg (01/17 2043)  Intake/Output from previous day: 01/17 0701 - 01/18 0700 In: 1120 [I.V.:1000; NG/GT:120] Out: 301 [Emesis/NG output:300; Stool:1] Intake/Output this shift: No intake/output data recorded.  Physical Exam: Work of breathing is slightly increased;  Abdomen appears distended but according to him is decreased from admission.    Lab Results:  Results for orders placed or performed during the hospital encounter of 11/03/18 (from the past 48 hour(s))  CBC with Differential     Status: Abnormal   Collection Time: 11/03/18  2:20 PM  Result Value Ref Range   WBC 21.2 (H) 4.0 - 10.5 K/uL   RBC 5.60 4.22 - 5.81 MIL/uL   Hemoglobin 18.4 (H) 13.0 - 17.0 g/dL   HCT 95.6 (H) 38.7 - 56.4 %   MCV 95.9 80.0 - 100.0 fL   MCH 32.9 26.0 - 34.0 pg   MCHC 34.3 30.0 - 36.0 g/dL   RDW 33.2 95.1 - 88.4 %   Platelets 434 (H) 150 - 400 K/uL   nRBC 0.0 0.0 - 0.2 %   Neutrophils Relative % 86 %   Neutro Abs 18.4 (H) 1.7 - 7.7 K/uL   Lymphocytes Relative 4 %   Lymphs Abs 0.9 0.7 - 4.0 K/uL   Monocytes Relative 6 %   Monocytes Absolute 1.3 (H) 0.1 - 1.0 K/uL   Eosinophils Relative 0 %   Eosinophils Absolute 0.1 0.0 - 0.5 K/uL   Basophils Relative 1 %   Basophils Absolute 0.1 0.0 - 0.1 K/uL   Immature Granulocytes 3 %   Abs Immature Granulocytes 0.53 (H) 0.00 - 0.07 K/uL    Comment: Performed at [facility], [address], [place], [place] [address]   Comprehensive metabolic panel     Status: Abnormal   Collection Time: 11/03/18  2:20 PM  Result Value Ref Range   Sodium 135 135 - 145 mmol/L   Potassium 4.7 3.5 - 5.1 mmol/L   Chloride 102 98 - 111 mmol/L   CO2 19 (L) 22 - 32 mmol/L   Glucose, Bld 157 (H) 70 - 99 mg/dL   BUN 28 (H) 8 - 23 mg/dL   Creatinine, Ser 3.01 (H) 0.61 - 1.24 mg/dL   Calcium 60.1 8.9 - 09.3 mg/dL   Total Protein 8.1 6.5 - 8.1 g/dL   Albumin 4.5 3.5 - 5.0 g/dL   AST 25 15 - 41 U/L   ALT 14 0 - 44 U/L   Alkaline Phosphatase 51 38 - 126 U/L   Total Bilirubin 1.1 0.3 - 1.2 mg/dL   GFR calc non Af Amer 31 (L) >60 mL/min   GFR calc Af Amer 36 (L) >60 mL/min   Anion gap 14 5 - 15    Comment: Performed at [facility], [address], [place], [place] [address]  Lipase, blood     Status: None   Collection Time: 11/03/18  2:20 PM  Result  Value Ref Range   Lipase 36 11 - 51 U/L    Comment: Performed at [facility], [address] [place], [place] [address]  I-Stat Chem 8, ED     Status: Abnormal   Collection Time: 11/03/18  2:41 PM  Result Value Ref Range   Sodium 136 135 - 145 mmol/L   Potassium 4.8 3.5 - 5.1 mmol/L   Chloride 104 98 - 111 mmol/L   BUN 31 (H) 8 - 23 mg/dL   Creatinine, Ser 6.96 (H) 0.61 - 1.24 mg/dL   Glucose, Bld 295 (H) 70 - 99 mg/dL   Calcium, Ion 2.84 1.32 - 1.40 mmol/L   TCO2 21 (L) 22 - 32 mmol/L   Hemoglobin 19.0 (H) 13.0 - 17.0 g/dL   HCT 44.0 (H) 10.2 - 72.5 %  Protime-INR     Status: Abnormal   Collection Time: 11/03/18  4:59 PM  Result Value Ref Range   Prothrombin Time 32.5 (H) 11.4 - 15.2 seconds   INR 3.23     Comment: Performed at [facility], [address] [place], [place] [address]  I-Stat CG4 Lactic Acid, ED     Status: None   Collection Time: 11/03/18  5:41 PM  Result Value Ref Range   Lactic Acid, Venous 1.48 0.5 - 1.9 mmol/L  Comprehensive metabolic panel     Status: Abnormal   Collection Time: 11/04/18  3:15 AM  Result Value Ref Range    Sodium 137 135 - 145 mmol/L   Potassium 3.5 3.5 - 5.1 mmol/L   Chloride 108 98 - 111 mmol/L   CO2 17 (L) 22 - 32 mmol/L   Glucose, Bld 135 (H) 70 - 99 mg/dL   BUN 32 (H) 8 - 23 mg/dL   Creatinine, Ser 0.34 (H) 0.61 - 1.24 mg/dL   Calcium 8.3 (L) 8.9 - 10.3 mg/dL   Total Protein 6.5 6.5 - 8.1 g/dL   Albumin 3.4 (L) 3.5 - 5.0 g/dL   AST 16 15 - 41 U/L   ALT 13 0 - 44 U/L   Alkaline Phosphatase 49 38 - 126 U/L   Total Bilirubin 1.0 0.3 - 1.2 mg/dL   GFR calc non Af Amer 38 (L) >60 mL/min   GFR calc Af Amer 44 (L) >60 mL/min   Anion gap 12 5 - 15    Comment: Performed at [facility], [address] [place], [place] [address]  Protime-INR     Status: Abnormal   Collection Time: 11/04/18  3:15 AM  Result Value Ref Range   Prothrombin Time 30.3 (H) 11.4 - 15.2 seconds   INR 2.95     Comment: Performed at [facility], [address] [place], [place] [address]  CBC WITH DIFFERENTIAL     Status: Abnormal   Collection Time: 11/04/18  3:15 AM  Result Value Ref Range   WBC 13.9 (H) 4.0 - 10.5 K/uL   RBC 4.53 4.22 - 5.81 MIL/uL   Hemoglobin 14.2 13.0 - 17.0 g/dL    Comment: DELTA CHECK NOTED REPEATED TO VERIFY    HCT 42.7 39.0 - 52.0 %   MCV 94.3 80.0 - 100.0 fL   MCH 31.3 26.0 - 34.0 pg   MCHC 33.3 30.0 - 36.0 g/dL   RDW 56.4 33.2 - 95.1 %   Platelets 246 150 - 400 K/uL   nRBC 0.0 0.0 - 0.2 %   Neutrophils Relative % 85 %   Neutro  Abs 11.7 (H) 1.7 - 7.7 K/uL   Lymphocytes Relative 6 %   Lymphs Abs 0.8 0.7 - 4.0 K/uL   Monocytes Relative 7 %   Monocytes Absolute 1.0 0.1 - 1.0 K/uL   Eosinophils Relative 1 %   Eosinophils Absolute 0.1 0.0 - 0.5 K/uL   Basophils Relative 0 %   Basophils Absolute 0.1 0.0 - 0.1 K/uL   Immature Granulocytes 1 %   Abs Immature Granulocytes 0.20 (H) 0.00 - 0.07 K/uL    Comment: Performed at [facility], [address] [place], [place] [address]  Magnesium     Status: Abnormal   Collection Time: 11/04/18  3:15 AM  Result Value  Ref Range   Magnesium 1.3 (L) 1.7 - 2.4 mg/dL    Comment: Performed at [facility], [address] [place], [place] [address]    Radiology/Results: Ct Abdomen Pelvis W Contrast  Result Date: 11/03/2018 CLINICAL DATA:  Abdominal pain and vomiting.  Abdominal distention. EXAM: CT ABDOMEN AND PELVIS WITH CONTRAST TECHNIQUE: Multidetector CT imaging of the abdomen and pelvis was performed using the standard protocol following bolus administration of intravenous contrast. CONTRAST:  80mL OMNIPAQUE IOHEXOL 300 MG/ML  SOLN COMPARISON:  03/05/2018 FINDINGS: Lower chest: No acute abnormalities. Stable scarring at both lung bases. Aortic atherosclerosis. Heart size is normal. Hepatobiliary: Stable 19 mm cyst in the posterior aspect of the dome of the right lobe of the liver. Liver and biliary tree are otherwise normal. Pancreas: Unremarkable. No pancreatic ductal dilatation or surrounding inflammatory changes. Spleen: Normal in size without focal abnormality. Adrenals/Urinary Tract: Adrenal glands are normal. Stable bilateral renal cysts. No hydronephrosis. Bladder is normal. Stomach/Bowel: The stomach and proximal small bowel are markedly distended. Distal small bowel is not distended. Colon is redundant but otherwise normal. Appendix has been removed. 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 Extensive aortic atherosclerosis. 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 Reproductive: Slight enlargement of the prostate gland. Other: No ascites.  No abdominal wall hernias. Musculoskeletal: No acute or significant osseous findings. IMPRESSION: Findings consistent with early complete or partial small bowel obstruction with marked distention of the stomach with dilatation of the proximal jejunum. No discrete transition point. 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 Electronically Signed   By: Francene BoyersJames  Maxwell M.D.   On:  11/03/2018 16:14   Dg Abd Portable 1v-small Bowel Protocol-position Verification  Result Date: 11/03/2018 CLINICAL DATA:  73 year old male with suspected small bowel obstruction on CT Abdomen and Pelvis earlier today. Initial image 4 enteric contrast radiographic series. EXAM: PORTABLE ABDOMEN - 1 VIEW COMPARISON:  CT Abdomen and Pelvis 1547 hours today and earlier. FINDINGS: Portable AP supine view at 2306 hours. The tip of an enteric tube projects over the proximal stomach (arrow). Stable bowel gas pattern. Excreted IV contrast in the urinary bladder. No acute osseous abnormality identified. IMPRESSION: Enteric tube projects over the proximal stomach. Stable bowel gas pattern. Electronically Signed   By: Odessa FlemingH  Hall M.D.   On: 11/03/2018 23:40    Anti-infectives: Anti-infectives (From admission, onward)   None      Assessment/Plan: Problem List: Patient Active Problem List   Diagnosis Date Noted  . Small bowel obstruction (HCC) 11/03/2018  . History of pulmonary embolism 02/09/2016  . Acute GI bleeding   . Anemia due to blood loss, acute   . GI bleed 04/08/2015  . GIB (gastrointestinal bleeding) 04/08/2015  .  AKI (acute kidney injury) (HCC) 04/08/2015  . Empyema lung (HCC)   . Loculated pleural effusion   . Pneumothorax   . Empyema, right (HCC) 11/25/2014  . Persistent right pleural effusion and right lower lobe infiltrate 11/14/2014  . HCAP (healthcare-associated pneumonia) 10/10/2014  . Peripheral vascular disease (HCC) 10/10/2014  . Anticoagulated on Coumadin 10/10/2014  . Supratherapeutic INR 10/10/2014  . Microcytic anemia 09/18/2013  . CAP (community acquired pneumonia) 09/09/2013  . AAA (abdominal aortic aneurysm) (HCC) 08/19/2013  . 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 Malignant HTN with heart disease, w/o CHF, with chronic kidney disease 08/19/2013  . DVT, lower extremity and pulmonary embolism, recurrent 08/19/2013  . Hyperlipidemia 08/19/2013  . Gout  08/19/2013  . COPD (chronic obstructive pulmonary disease) (HCC) 08/19/2013  . OSA (obstructive sleep apnea) 08/19/2013  . Abnormal nuclear stress test 08/19/2013  . Bilateral renal artery stenosis (HCC) 08/19/2013  . DVT (deep venous thrombosis) (HCC) 06/28/2012    Presumed SBO after eating 4 eggs with 4 weiners.  Less distended and with bowel movements but still with some pain on abdominal exam.  Continue to observe with NG suction. * No surgery found *    LOS: 1 day   Matt B. Daphine DeutscherMartin, MD, [facility]  [facility] [phone] beeper [phone]  11/04/2018 8:52 AM

## 2018-11-04 NOTE — Progress Notes (Addendum)
PROGRESS NOTE  Tony MinorsJerry F Zhang WGN:[id number] DOB: 1945/10/28 DOA: 11/03/2018 PCP: Tony Zhang  HPI/Recap of past 24 hours: Tony MinorsJerry F Zhang is a 73 y.o. 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 Patient presents with complaints of 3-day history of nausea and vomiting.  Associated with significant abdominal pain and distention.    ED Course: Presents with abdominal pain, CT scan of the abdomen is positive for small bowel obstruction.  General surgery was consulted and the patient was referred for admission to hospital service.  11/04/2018: Patient seen and examined at his bedside.  He continues to have significant abdominal pain refractory to IV pain medications.  NG suction in place with persistent output.  States he had 2 watery bowel movements today.   Assessment/Plan: Principal Problem:   Small bowel obstruction (HCC) Active Problems:   AAA (abdominal aortic aneurysm) (HCC)   Chronic distal aortic occlusion (HCC)   DVT, lower extremity and pulmonary embolism, recurrent   Hyperlipidemia   COPD (chronic obstructive pulmonary disease) (HCC)   OSA (obstructive sleep apnea)   Peripheral vascular disease (HCC)   Anticoagulated on Coumadin  Small bowel obstruction (HCC) Presented with abdominal pain, distention, leukocytosis with WBC 21,000, heart rate of 111 and tachypnea with respiratory rate of 29. Self reports prior abdominal surgeries from cholecystectomy and hernia repair CT abdomen and pelvis with contrast done on 11/03/2018 revealed findings consistent with early complete or partial small bowel obstruction with marked distention of the stomach and jejunum. General surgery consulted and following Continue n.p.o. Continues to have significant NG suction output  Continue IV fluids, IV Zofran and pain management with IV fentanyl    Leukocytosis is trending down from 21K to 13 K  AKI on CKD 3 complicated by non-anion gap metabolic acidosis Suspect prerenal Baseline creatinine appears to be 1.5 with GFR 46 Presented with creatinine of 2.07 Continue to avoid nephrotoxic agents/dehydration/hypotension Creatinine is improving from 1.90-1.75 Continue IV fluid Continue to monitor urine output Repeat BMP in the morning   Non anion gap metabolic acidosis Possibly secondary to AKI Lactic acid 1.48 Chemistry bicarb 17 from 19 Start isotonic bicarb  Repeat BMP in the morning  Severe hypomagnesemia Magnesium 1.3 Replete with IV magnesium 4 g x 2 Repeat labs in the afternoon  Recurrent DVT and PE. Patient is on chronic anticoagulation with warfarin. INR is therapeutic at 2.9  Coumadin on hold due to n.p.o. Will transition to heparin drip if okay with surgery  Chronic distal aortic occlusion Diagnosed 5 years ago Seen again on CT abdomen and pelvis with contrast on admission  History of upper GI bleed Self-reported had an endoscopy in the past and treated for upper GI bleed Hemoglobin drop from 19 to 14.2 No sign of overt bleeding  Peripheral vascular disease. Patient is on Pletal at home. Currently on hold as patient is n.p.o. Monitor.  Bilateral renal artery stenosis Diagnosed 5 years ago  History of COPD. Continue inhalers. Does not appear to be having any exacerbation right now.  Body mass index is 31 kg/m.  Obesity. OSA. Will discussed the patient regarding use of CPAP outpatient I do not see any documentation.  Chronic pain syndrome - [facility] Cont[facility] Substance [facility] database was reviewed. - Last prescription for controlled substance was on December 2019 for Norco. -Continue here in the hospital once patient is able to take p.o.  Risk: High risk  for decompensation due to ongoing small bowel obstruction, severe dyselectrolytemia, multiple comorbidities, and advanced  age.  Nutrition: NPO DVT Prophylaxis: on therapeutic Coumadin.  Transition to heparin drip  Advance goals of care discussion: Full code Patient want his sister and brother-in-law Tony Zhang to be healthcare proxy. In the past the patient had partial code with no CPR or shocks, currently he wants to try resuscitation once before becoming a full DNR.   Consults: General surgery   Family Communication: no family was present at bedside, at the time of interview.  Disposition: DC home in 2 to 3 days when general surgery signs of.   Objective: Vitals:   11/03/18 1406 11/03/18 2017 11/03/18 2043 11/04/18 0555  BP: 117/71 (!) 128/55 (!) 161/74 102/71  Pulse: (!) 110 96 (!) 104 99  Resp: 20 17 20 18   Temp: 98.4 F (36.9 C)  (!) 97.5 F (36.4 C)   TempSrc: Oral  Oral   SpO2: 99% 94% 100% 95%  Weight:   106.2 kg   Height:   6\' 1"  (1.854 m)     Intake/Output Summary (Last 24 hours) at 11/04/2018 1245 Last data filed at 11/04/2018 1004 Gross per 24 hour  Intake 1123 ml  Output 301 ml  Net 822 ml   Filed Weights   11/03/18 1350 11/03/18 2043  Weight: 106.6 kg 106.2 kg    Exam:  . General: 73 y.o. year-old male well developed well nourished in no acute distress.  Alert and oriented x3.  NG tube in place. . Cardiovascular: Regular rate and rhythm with no rubs or gallops.  No thyromegaly or JVD noted.   [name] Respiratory: Clear to auscultation with no wheezes or rales. Good inspiratory effort. . Abdomen: Significantly distended with no notable bowel sounds.  . Musculoskeletal: Trace lower extremity edema. 2/4 pulses in all 4 extremities. [name] Psychiatry: Mood is appropriate for condition and setting   Data Reviewed: CBC: Recent Labs  Lab 11/03/18 1420 11/03/18 1441 11/04/18 0315  WBC 21.2*  --  13.9*  NEUTROABS 18.4*  --  11.7*  HGB 18.4* 19.0* 14.2  HCT 53.7* 56.0* 42.7  MCV 95.9  --  94.3  PLT 434*  --  246   Basic Metabolic Panel: Recent Labs  Lab 11/03/18 1420  11/03/18 1441 11/04/18 0315  NA 135 136 137  K 4.7 4.8 3.5  CL 102 104 108  CO2 19*  --  17*  GLUCOSE 157* 153* 135*  BUN 28* 31* 32*  CREATININE 2.07* 1.90* 1.75*  CALCIUM 10.0  --  8.3*  MG  --   --  1.3*   GFR: Estimated Creatinine Clearance: 48.1 mL/min (A) (by C-G formula based on SCr of 1.75 mg/dL (H)). Liver Function Tests: Recent Labs  Lab 11/03/18 1420 11/04/18 0315  AST 25 16  ALT 14 13  ALKPHOS 51 49  BILITOT 1.1 1.0  PROT 8.1 6.5  ALBUMIN 4.5 3.4*   Recent Labs  Lab 11/03/18 1420  LIPASE 36   No results for input(s): AMMONIA in the last 168 hours. Coagulation Profile: Recent Labs  Lab 11/03/18 1659 11/04/18 0315  INR 3.23 2.95   Cardiac Enzymes: No results for input(s): CKTOTAL, CKMB, CKMBINDEX, TROPONINI in the last 168 hours. BNP (last 3 results) No results for input(s): PROBNP in the last 8760 hours. HbA1C: No results for input(s): HGBA1C in the last 72 hours. CBG: No results for input(s): GLUCAP in the last 168 hours. Lipid Profile: No results for input(s): CHOL, HDL,  LDLCALC, TRIG, CHOLHDL, LDLDIRECT in the last 72 hours. Thyroid Function Tests: No results for input(s): TSH, T4TOTAL, FREET4, T3FREE, THYROIDAB in the last 72 hours. Anemia Panel: No results for input(s): VITAMINB12, FOLATE, FERRITIN, TIBC, IRON, RETICCTPCT in the last 72 hours. Urine analysis:    Component Value Date/Time   COLORURINE YELLOW 04/08/2015 2033   APPEARANCEUR CLEAR 04/08/2015 2033   LABSPEC 1.018 04/08/2015 2033   PHURINE 5.5 04/08/2015 2033   GLUCOSEU NEGATIVE 04/08/2015 2033   HGBUR NEGATIVE 04/08/2015 2033   BILIRUBINUR NEGATIVE 04/08/2015 2033   KETONESUR NEGATIVE 04/08/2015 2033   PROTEINUR NEGATIVE 04/08/2015 2033   UROBILINOGEN 0.2 04/08/2015 2033   NITRITE NEGATIVE 04/08/2015 2033   LEUKOCYTESUR NEGATIVE 04/08/2015 2033   Sepsis Labs: @LABRCNTIP (procalcitonin:4,lacticidven:4)  )No results found for this or any previous visit (from the past  240 hour(s)).    Studies: Ct Abdomen Pelvis W Contrast  Result Date: 11/03/2018 CLINICAL DATA:  Abdominal pain and vomiting.  Abdominal distention. EXAM: CT ABDOMEN AND PELVIS WITH CONTRAST TECHNIQUE: Multidetector CT imaging of the abdomen and pelvis was performed using the standard protocol following bolus administration of intravenous contrast. CONTRAST:  80mL OMNIPAQUE IOHEXOL 300 MG/ML  SOLN COMPARISON:  03/05/2018 FINDINGS: Lower chest: No acute abnormalities. Stable scarring at both lung bases. Aortic atherosclerosis. Heart size is normal. Hepatobiliary: Stable 19 mm cyst in the posterior aspect of the dome of the right lobe of the liver. Liver and biliary tree are otherwise normal. Pancreas: Unremarkable. No pancreatic ductal dilatation or surrounding inflammatory changes. Spleen: Normal in size without focal abnormality. Adrenals/Urinary Tract: Adrenal glands are normal. Stable bilateral renal cysts. No hydronephrosis. Bladder is normal. Stomach/Bowel: The stomach and proximal small bowel are markedly distended. Distal small bowel is not distended. Colon is redundant but otherwise normal. Appendix has been removed. 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 Extensive aortic atherosclerosis. 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 Reproductive: Slight enlargement of the prostate gland. Other: No ascites.  No abdominal wall hernias. Musculoskeletal: No acute or significant osseous findings. IMPRESSION: Findings consistent with early complete or partial small bowel obstruction with marked distention of the stomach with dilatation of the proximal jejunum. No discrete transition point. 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 Electronically Signed   By: Francene Boyers M.D.   On: 11/03/2018 16:14   Dg Abd Portable 1v-small Bowel Obstruction Protocol-initial, 8 Hr Delay  Result Date: 11/04/2018 CLINICAL DATA:   Small-bowel obstruction.  Follow-up exam. EXAM: PORTABLE ABDOMEN - 1 VIEW COMPARISON:  11/03/2018 FINDINGS: There is contrast within the stomach. There may be some diluted contrast within proximal small bowel, but this is not definitive. No small bowel dilation is visualized. Is air mixed with stool in the colon. Nasogastric tube tip lies within the proximal stomach. No acute findings noted in the lungs. IMPRESSION: 1. Contrast is seen within the stomach, with little of any noted in small bowel. 2. No radiographic bowel dilation to indicate significant bowel obstruction. Electronically Signed   By: Amie Portland M.D.   On: 11/04/2018 09:41   Dg Abd Portable 1v-small Bowel Protocol-position Verification  Result Date: 11/03/2018 CLINICAL DATA:  73 year old male with suspected small bowel obstruction on CT Abdomen and Pelvis earlier today. Initial image 4 enteric contrast radiographic series. EXAM: PORTABLE ABDOMEN - 1 VIEW COMPARISON:  CT Abdomen and Pelvis 1547 hours today and earlier. FINDINGS: Portable AP supine view at 2306 hours. The tip of an enteric  tube projects over the proximal stomach (arrow). Stable bowel gas pattern. Excreted IV contrast in the urinary bladder. No acute osseous abnormality identified. IMPRESSION: Enteric tube projects over the proximal stomach. Stable bowel gas pattern. Electronically Signed   By: Odessa Fleming M.D.   On: 11/03/2018 23:40    Scheduled Meds: . sodium chloride flush  3 mL Intravenous Q12H    Continuous Infusions: . lactated ringers 100 mL/hr at 11/04/18 0558  . magnesium sulfate 1 - 4 g bolus IVPB    . potassium chloride    . sodium chloride       LOS: 1 day     Darlin Drop, [facility] Pager [phone]  If 7PM-7AM, please contact night-coverage [email or web address] Password [facility] 11/04/2018, 12:45 PM

## 2018-11-05 ENCOUNTER — Inpatient Hospital Stay (HOSPITAL_COMMUNITY): Payer: Medicare Other

## 2018-11-05 LAB — BASIC METABOLIC PANEL
Anion gap: 11 (ref 5–15)
BUN: 23 mg/dL (ref 8–23)
CHLORIDE: 106 mmol/L (ref 98–111)
CO2: 22 mmol/L (ref 22–32)
Calcium: 8.2 mg/dL — ABNORMAL LOW (ref 8.9–10.3)
Creatinine, Ser: 1.28 mg/dL — ABNORMAL HIGH (ref 0.61–1.24)
GFR calc Af Amer: >60 mL/min (ref >60)
GFR calc non Af Amer: 55 mL/min — ABNORMAL LOW (ref >60)
Glucose, Bld: 89 mg/dL (ref 70–99)
POTASSIUM: 3.4 mmol/L — AB (ref 3.5–5.1)
Sodium: 139 mmol/L (ref 135–145)

## 2018-11-05 LAB — CBC
HCT: 42 % (ref 39.0–52.0)
HEMOGLOBIN: 14.3 g/dL (ref 13.0–17.0)
MCH: 33 pg (ref 26.0–34.0)
MCHC: 34 g/dL (ref 30.0–36.0)
MCV: 97 fL (ref 80.0–100.0)
Platelets: 230 10*3/uL (ref 150–400)
RBC: 4.33 MIL/uL (ref 4.22–5.81)
RDW: 14.7 % (ref 11.5–15.5)
WBC: 9.7 10*3/uL (ref 4.0–10.5)
nRBC: 0 % (ref 0.0–0.2)

## 2018-11-05 LAB — URINALYSIS, ROUTINE W REFLEX MICROSCOPIC
BILIRUBIN URINE: NEGATIVE
Glucose, UA: NEGATIVE mg/dL
Ketones, ur: NEGATIVE mg/dL
NITRITE: POSITIVE — AB
Protein, ur: NEGATIVE mg/dL
Specific Gravity, Urine: 1.017 (ref 1.005–1.030)
WBC, UA: >50 WBC/hpf — ABNORMAL HIGH (ref 0–5)
pH: 6 (ref 5.0–8.0)

## 2018-11-05 LAB — PROTIME-INR
INR: 2.3
INR: 1.94
PROTHROMBIN TIME: 21.9 s — AB (ref 11.4–15.2)
Prothrombin Time: 25 seconds — ABNORMAL HIGH (ref 11.4–15.2)

## 2018-11-05 MED ORDER — HEPARIN (PORCINE) 25000 UT/250ML-% IV SOLN
2350.0000 [IU]/h | INTRAVENOUS | Status: DC
  Administered 2018-11-05: 1400 [IU]/h via INTRAVENOUS
  Administered 2018-11-06: 1700 [IU]/h via INTRAVENOUS
  Administered 2018-11-07 (×2): 2100 [IU]/h via INTRAVENOUS
  Administered 2018-11-08: 2400 [IU]/h via INTRAVENOUS
  Administered 2018-11-08: 2150 [IU]/h via INTRAVENOUS
  Administered 2018-11-09 – 2018-11-11 (×7): 2400 [IU]/h via INTRAVENOUS
  Administered 2018-11-12: 2350 [IU]/h via INTRAVENOUS
  Administered 2018-11-12: 2400 [IU]/h via INTRAVENOUS
  Administered 2018-11-13 – 2018-11-14 (×5): 2350 [IU]/h via INTRAVENOUS
  Filled 2018-11-05 (×23): qty 250

## 2018-11-05 MED ORDER — POTASSIUM CHLORIDE 10 MEQ/100ML IV SOLN
10.0000 meq | INTRAVENOUS | Status: DC
  Administered 2018-11-05 (×3): 10 meq via INTRAVENOUS
  Filled 2018-11-05 (×3): qty 100

## 2018-11-05 MED ORDER — SODIUM CHLORIDE 0.9 % IV SOLN
1.0000 g | INTRAVENOUS | Status: DC
  Administered 2018-11-05 – 2018-11-07 (×3): 1 g via INTRAVENOUS
  Filled 2018-11-05 (×3): qty 10

## 2018-11-05 MED ORDER — KCL-LACTATED RINGERS-D5W 20 MEQ/L IV SOLN
INTRAVENOUS | Status: DC
  Administered 2018-11-05 – 2018-11-13 (×11): via INTRAVENOUS
  Filled 2018-11-05 (×14): qty 1000

## 2018-11-05 NOTE — Plan of Care (Signed)

## 2018-11-05 NOTE — Progress Notes (Signed)
ANTICOAGULATION CONSULT NOTE - Initial Consult  Pharmacy Consult for Heparin Indication: recurrent PE/DVTs, when INR <2  Allergies  Allergen Reactions  . Codeine Hives and Other (See Comments)    Takes benadryl to stop reaction    Patient Measurements: Height: 6\' 1"  (185.4 cm) Weight: 234 lb 2.1 oz (106.2 kg) IBW/kg (Calculated) : 79.9 Heparin Dosing Weight: 102 kg  Vital Signs: Temp: 98.4 F (36.9 C) (01/19 0524) Temp Source: Oral (01/19 0524) BP: 122/69 (01/19 0524) Pulse Rate: 108 (01/19 0524)  Labs: Recent Labs    11/03/18 1659 11/04/18 0315 11/04/18 1754 11/05/18 0342  HGB  --  14.2 13.9 14.3  HCT  --  42.7 41.3 42.0  PLT  --  246 256 230  LABPROT 32.5* 30.3*  --  25.0*  INR 3.23 2.95  --  2.30  CREATININE  --  1.75* 1.48* 1.28*    Estimated Creatinine Clearance: 65.7 mL/min (A) (by C-G formula based on SCr of 1.28 mg/dL (H)).   Medical History: Past Medical History:  Diagnosis Date  . AAA (abdominal aortic aneurysm) (HCC)   . 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    [name] COPD (chronic obstructive pulmonary disease) (HCC)   . DJD (degenerative joint disease)   . Dyslipidemia   . Gout   . Malignant hypertension   . Peripheral vascular disease (HCC)   . Pneumonia 08/2013   pt. reports that he is having this surgery 11/25/2014- for the lung problem that began with pneumonia in 09/2014  . Shortness of breath   . Venous thrombosis    Recurrent     Assessment: 73 year old male on chronic Coumadin for recurrent DVT and PE now NPO due to SBO. Pharmacy consulted to start IV Heparin as bridge while NPO.   Currently INR in therapeutic range at 2.30 but trending down. Continuing hold Heparin start until INR <2 - will recheck INR this PM to re-evaluate ability to start IV Heparin. Last dose Coumadin was 11/02/18. Home regimen was 7mg  daily.   Goal of Therapy:  INR 2-3 Heparin level 0.3-0.7  units/ml Monitor platelets by anticoagulation protocol: Yes   Plan:  Recheck 1700 PT/INR Hold IV Heparin for now - start when INR <2.  Follow-up po status and ability to resume Coumadin. Daily PT/INR, CBC  Fayne Norrie 11/05/2018,7:19 AM

## 2018-11-05 NOTE — Progress Notes (Signed)
ANTICOAGULATION CONSULT NOTE - Follow-Up Consult  Pharmacy Consult for Heparin Indication: recurrent PE/DVTs, when INR <2  Allergies  Allergen Reactions  . Codeine Hives and Other (See Comments)    Takes benadryl to stop reaction    Patient Measurements: Height: 6\' 1"  (185.4 cm) Weight: 234 lb 2.1 oz (106.2 kg) IBW/kg (Calculated) : 79.9 Heparin Dosing Weight: 102 kg  Vital Signs: Temp: 98.7 F (37.1 C) (01/19 1422) Temp Source: Oral (01/19 1422) BP: 122/58 (01/19 1422) Pulse Rate: 94 (01/19 1422)  Labs: Recent Labs    11/04/18 0315 11/04/18 1754 11/05/18 0342 11/05/18 1616  HGB 14.2 13.9 14.3  --   HCT 42.7 41.3 42.0  --   PLT 246 256 230  --   LABPROT 30.3*  --  25.0* 21.9*  INR 2.95  --  2.30 1.94  CREATININE 1.75* 1.48* 1.28*  --     Estimated Creatinine Clearance: 65.7 mL/min (A) (by C-G formula based on SCr of 1.28 mg/dL (H)).   Medical History: Past Medical History:  Diagnosis Date  . AAA (abdominal aortic aneurysm) (HCC)   . 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    [name] COPD (chronic obstructive pulmonary disease) (HCC)   . DJD (degenerative joint disease)   . Dyslipidemia   . Gout   . Malignant hypertension   . Peripheral vascular disease (HCC)   . Pneumonia 08/2013   pt. reports that he is having this surgery 11/25/2014- for the lung problem that began with pneumonia in 09/2014  . Shortness of breath   . Venous thrombosis    Recurrent     Assessment: 73 year old male on chronic Coumadin for recurrent DVT and PE now NPO due to SBO. Pharmacy consulted to start IV Heparin as bridge while NPO.   Currently INR in therapeutic range at 2.30 but trending down. Continuing hold Heparin start until INR <2 - will recheck INR this PM to re-evaluate ability to start IV Heparin. Last dose Coumadin was 11/02/18. Home regimen was 7mg  daily.   PM Update: INR now trended below therapeutic range to  1.94 so will begin heparin infusion.  Goal of Therapy:  INR 2-3 Heparin level 0.3-0.7 units/ml Monitor platelets by anticoagulation protocol: Yes   Plan:  -Heparin 1400 units/hr -Check heparin level in 8hr  Fredonia Highland, PharmD, BCPS Clinical Pharmacist Please check AMION for all [facility] numbers 11/05/2018

## 2018-11-05 NOTE — Progress Notes (Signed)
PROGRESS NOTE  Tony Zhang [id number] DOB: 07-19-1946 DOA: 11/03/2018 PCP: [name], [name], MD  HPI/Recap of past 24 hours: Tony Zhang is a 73 y.o. 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 Patient presents with complaints of 3-day history of nausea and vomiting.  Associated with significant abdominal pain and distention.    ED Course: Presents with abdominal pain, CT scan of the abdomen is positive for small bowel obstruction.  General surgery was consulted and the patient was referred for admission to hospital service.  11/04/2018: Patient seen and examined at his bedside.  He continues to have significant abdominal pain refractory to IV pain medications.  NG suction in place with persistent output.  States he had 2 watery bowel movements today.  11/05/2018: Patient seen and examined with Dr. Molli HazardMatthew at bedside.  Reports dysuria and increasing urinary frequency which patient states was present prior to admission.  Urine analysis positive for bacteriuria.  Will treat for UTI, present on admission with Rocephin empirically until urine culture results.   Assessment/Plan: Principal Problem:   Small bowel obstruction (HCC) Active Problems:   AAA (abdominal aortic aneurysm) (HCC)   Chronic distal aortic occlusion (HCC)   DVT, lower extremity and pulmonary embolism, recurrent   Hyperlipidemia   COPD (chronic obstructive pulmonary disease) (HCC)   OSA (obstructive sleep apnea)   Peripheral vascular disease (HCC)   Anticoagulated on Coumadin  Sepsis secondary to small bowel obstruction versus UTI, both present on admission Presented with abdominal pain, distention, leukocytosis with WBC 21,000, heart rate of 111 and tachypnea with respiratory rate of 29. Self reports prior abdominal surgeries from cholecystectomy and hernia repair  Now  positive UA with dysuria and polyuria Start Rocephin daily, day #1 Urine culture is pending CT abdomen and pelvis with contrast done on 11/03/2018 revealed findings consistent with early complete or partial small bowel obstruction with marked distention of the stomach and jejunum. General surgery following.  Highly appreciated. Continue n.p.o. Continue IV fluids, IV Zofran, pain management with IV fentanyl as needed Leukocytosis continues to improve Continue to monitor output from NG tube suction  UTI, present on admission Urinalysis positive for pyuria Urine culture pending Rocephin started today 11/05/2018  AKI on CKD 3, improving Anion gap metabolic acidosis has resolved, normal bicarb today Suspect AKI is prerenal Appears to be back to his baseline with creatinine of 1.2 and GFR 55 Continue to avoid nephrotoxic agents/hypotension Continue IV fluid Repeat BMP in the morning Continue to monitor urine output  Resolved non anion gap metabolic acidosis  Resolved severe hypomagnesemia post repletion Presented with magnesium of 1.3 which was repleted  Recurrent DVT and PE. Patient is on chronic anticoagulation with warfarin. INR is therapeutic at 2.3 Continue to hold Coumadin Transition to heparin drip once INR is less than 2 Pharmacy is managing heparin and Coumadin  Chronic distal aortic occlusion Diagnosed 5 years ago Seen again on CT abdomen and pelvis with contrast on admission  History of upper GI bleed Self-reported had an endoscopy in the past and treated for upper GI bleed Hemoglobin is stable No sign of overt bleeding  Peripheral vascular disease. Patient is on Pletal at home. Currently on hold as patient is n.p.o. Monitor.  Bilateral renal artery stenosis Diagnosed 5 years ago  History of COPD. Continue inhalers. Does not appear to be having any exacerbation right now.  Body mass index is 31 kg/m.  Obesity. OSA. Will discussed the patient  regarding use of CPAP outpatient  Chronic pain syndrome - [place] Controlled Substance Reporting System database was reviewed. - Last prescription for controlled substance was on December 2019 for Norco. -Continue here in the hospital once patient is able to take p.o.  Risk: High risk for decompensation due to ongoing small bowel obstruction, severe dyselectrolytemia, multiple comorbidities, and advanced age.  Nutrition: NPO DVT Prophylaxis: on therapeutic Coumadin.  Transition to heparin drip when INR is less than 2.0  Advance goals of care discussion: Full code Patient want his sister and brother-in-law [name] to be healthcare proxy. In the past the patient had partial code with no CPR or shocks, currently he wants to try resuscitation once before becoming a full DNR.   Consults: General surgery   Family Communication: no family was present at bedside, at the time of interview.  Disposition: DC home when general surgery signs off.  Objective: Vitals:   11/04/18 0555 11/04/18 1427 11/04/18 2151 11/05/18 0524  BP: 102/71 126/67 (!) 111/58 122/69  Pulse: 99 (!) 104 (!) 108 (!) 108  Resp: 18  18 20   Temp:  98.2 F (36.8 C) 98.7 F (37.1 C) 98.4 F (36.9 C)  TempSrc:  Oral Oral Oral  SpO2: 95% 94% 93% 93%  Weight:      Height:        Intake/Output Summary (Last 24 hours) at 11/05/2018 1202 Last data filed at 11/05/2018 0900 Gross per 24 hour  Intake 2776.78 ml  Output 1650 ml  Net 1126.78 ml   Filed Weights   11/03/18 1350 11/03/18 2043  Weight: 106.6 kg 106.2 kg    Exam:  . General: 73 y.o. year-old male well-developed well-nourished in no acute distress.  Alert and oriented x3.  NG tube to suction in place. . Cardiovascular: Regular rate and rhythm with no rubs or gallops.  No JVD or thyromegaly noted. [name] Respiratory: Clear to auscultation with no wheezes or rales.  Good inspiratory effort. . Abdomen: Abdomen is distended with no notable bowel  sounds.  Less tenderness on palpation. . Musculoskeletal: Trace lower extremity edema. 2/4 pulses in all 4 extremities. [name] Psychiatry: Mood is appropriate for condition and setting   Data Reviewed: CBC: Recent Labs  Lab 11/03/18 1420 11/03/18 1441 11/04/18 0315 11/04/18 1754 11/05/18 0342  WBC 21.2*  --  13.9* 12.4* 9.7  NEUTROABS 18.4*  --  11.7*  --   --   HGB 18.4* 19.0* 14.2 13.9 14.3  HCT 53.7* 56.0* 42.7 41.3 42.0  MCV 95.9  --  94.3 95.8 97.0  PLT 434*  --  246 256 230   Basic Metabolic Panel: Recent Labs  Lab 11/03/18 1420 11/03/18 1441 11/04/18 0315 11/04/18 1754 11/05/18 0342  NA 135 136 137 134* 139  K 4.7 4.8 3.5 3.4* 3.4*  CL 102 104 108 105 106  CO2 19*  --  17* 19* 22  GLUCOSE 157* 153* 135* 112* 89  BUN 28* 31* 32* 28* 23  CREATININE 2.07* 1.90* 1.75* 1.48* 1.28*  CALCIUM 10.0  --  8.3* 8.0* 8.2*  MG  --   --  1.3* 2.4  --    GFR: Estimated Creatinine Clearance: 65.7 mL/min (A) (by C-G formula based on SCr of 1.28 mg/dL (H)). Liver Function Tests: Recent Labs  Lab 11/03/18 1420 11/04/18 0315  AST 25 16  ALT 14 13  ALKPHOS 51 49  BILITOT 1.1 1.0  PROT 8.1 6.5  ALBUMIN 4.5  3.4*   Recent Labs  Lab 11/03/18 1420  LIPASE 36   No results for input(s): AMMONIA in the last 168 hours. Coagulation Profile: Recent Labs  Lab 11/03/18 1659 11/04/18 0315 11/05/18 0342  INR 3.23 2.95 2.30   Cardiac Enzymes: No results for input(s): CKTOTAL, CKMB, CKMBINDEX, TROPONINI in the last 168 hours. BNP (last 3 results) No results for input(s): PROBNP in the last 8760 hours. HbA1C: No results for input(s): HGBA1C in the last 72 hours. CBG: No results for input(s): GLUCAP in the last 168 hours. Lipid Profile: No results for input(s): CHOL, HDL, LDLCALC, TRIG, CHOLHDL, LDLDIRECT in the last 72 hours. Thyroid Function Tests: No results for input(s): TSH, T4TOTAL, FREET4, T3FREE, THYROIDAB in the last 72 hours. Anemia Panel: No results for input(s):  VITAMINB12, FOLATE, FERRITIN, TIBC, IRON, RETICCTPCT in the last 72 hours. Urine analysis:    Component Value Date/Time   COLORURINE YELLOW 11/05/2018 0927   APPEARANCEUR HAZY (A) 11/05/2018 0927   LABSPEC 1.017 11/05/2018 0927   PHURINE 6.0 11/05/2018 0927   GLUCOSEU NEGATIVE 11/05/2018 0927   HGBUR SMALL (A) 11/05/2018 0927   BILIRUBINUR NEGATIVE 11/05/2018 0927   KETONESUR NEGATIVE 11/05/2018 0927   PROTEINUR NEGATIVE 11/05/2018 0927   UROBILINOGEN 0.2 04/08/2015 2033   NITRITE POSITIVE (A) 11/05/2018 0927   LEUKOCYTESUR LARGE (A) 11/05/2018 0927   Sepsis Labs: @LABRCNTIP (procalcitonin:4,lacticidven:4)  )No results found for this or any previous visit (from the past 240 hour(s)).    Studies: Dg Abd Portable 1v  Result Date: 11/05/2018 CLINICAL DATA:  Small bowel obstruction. EXAM: PORTABLE ABDOMEN - 1 VIEW COMPARISON:  November 04, 2018 FINDINGS: The distal tip of the NG tube terminates just below the GE junction. No change in the lung bases. No free air, portal venous gas, or pneumatosis identified. Contrast is now seen within the colon. A mildly prominent air-filled loop of bowel in the lower abdomen is probably colonic. No definitive small bowel dilatation noted. IMPRESSION: 1. The distal tip of the NG tube appears to be just below the GE junction. Consider advancement. 2. Contrast is now seen in the colon. An air-filled loop of bowel in the lower mid abdomen is likely colonic. No definitive small bowel dilatation seen on this study. Electronically Signed   By: Gerome Sam III M.D   On: 11/05/2018 10:24    Scheduled Meds: . sodium chloride flush  3 mL Intravenous Q12H    Continuous Infusions: . cefTRIAXone (ROCEPHIN)  IV    . dextrose 5% lactated ringers with KCl 20 mEq/L 50 mL/hr at 11/05/18 1150  . sodium chloride       LOS: 2 days     Darlin Drop, MD [facility] Pager [phone]  If 7PM-7AM, please contact night-coverage [email or web address] Password  [facility] 11/05/2018, 12:02 PM

## 2018-11-05 NOTE — Progress Notes (Signed)
Patient ID: Tony Zhang, male   DOB: 06/27/1946, 73 y.o.   MRN: [id number] [facility] Surgery Progress Note:   * No surgery found *  Subjective: Mental status is clear.  Complaining of burning with urination.  Seen with Dr. Margo Aye Objective: Vital signs in last 24 hours: Temp:  [98.2 F (36.8 C)-98.7 F (37.1 C)] 98.4 F (36.9 C) (01/19 0524) Pulse Rate:  [104-108] 108 (01/19 0524) Resp:  [18-20] 20 (01/19 0524) BP: (111-126)/(58-69) 122/69 (01/19 0524) SpO2:  [93 %-94 %] 93 % (01/19 0524)  Intake/Output from previous day: 01/18 0701 - 01/19 0700 In: 2779.8 [I.V.:2457; NG/GT:30; IV Piggyback:292.8] Out: 1400 [Urine:700; Emesis/NG output:700] Intake/Output this shift: Total I/O In: -  Out: 250 [Urine:250]  Physical Exam: Work of breathing is unchanged from yesterday.  Normal at rest.  NG in place with tan drainage.  Abdomen is about as distended as yesterday with some tenderness in the umbilical region.  He says that his abdomen is feeling better.    Lab Results:  Results for orders placed or performed during the hospital encounter of 11/03/18 (from the past 48 hour(s))  CBC with Differential     Status: Abnormal   Collection Time: 11/03/18  2:20 PM  Result Value Ref Range   WBC 21.2 (H) 4.0 - 10.5 K/uL   RBC 5.60 4.22 - 5.81 MIL/uL   Hemoglobin 18.4 (H) 13.0 - 17.0 g/dL   HCT 40.9 (H) 81.1 - 91.4 %   MCV 95.9 80.0 - 100.0 fL   MCH 32.9 26.0 - 34.0 pg   MCHC 34.3 30.0 - 36.0 g/dL   RDW 78.2 95.6 - 21.3 %   Platelets 434 (H) 150 - 400 K/uL   nRBC 0.0 0.0 - 0.2 %   Neutrophils Relative % 86 %   Neutro Abs 18.4 (H) 1.7 - 7.7 K/uL   Lymphocytes Relative 4 %   Lymphs Abs 0.9 0.7 - 4.0 K/uL   Monocytes Relative 6 %   Monocytes Absolute 1.3 (H) 0.1 - 1.0 K/uL   Eosinophils Relative 0 %   Eosinophils Absolute 0.1 0.0 - 0.5 K/uL   Basophils Relative 1 %   Basophils Absolute 0.1 0.0 - 0.1 K/uL   Immature Granulocytes 3 %   Abs Immature Granulocytes 0.53 (H) 0.00 - 0.07  K/uL    Comment: Performed at [facility], [address], [place], [place] [address]  Comprehensive metabolic panel     Status: Abnormal   Collection Time: 11/03/18  2:20 PM  Result Value Ref Range   Sodium 135 135 - 145 mmol/L   Potassium 4.7 3.5 - 5.1 mmol/L   Chloride 102 98 - 111 mmol/L   CO2 19 (L) 22 - 32 mmol/L   Glucose, Bld 157 (H) 70 - 99 mg/dL   BUN 28 (H) 8 - 23 mg/dL   Creatinine, Ser 8.46 (H) 0.61 - 1.24 mg/dL   Calcium 96.2 8.9 - 95.2 mg/dL   Total Protein 8.1 6.5 - 8.1 g/dL   Albumin 4.5 3.5 - 5.0 g/dL   AST 25 15 - 41 U/L   ALT 14 0 - 44 U/L   Alkaline Phosphatase 51 38 - 126 U/L   Total Bilirubin 1.1 0.3 - 1.2 mg/dL   GFR calc non Af Amer 31 (L) >60 mL/min   GFR calc Af Amer 36 (L) >60 mL/min   Anion gap 14 5 - 15    Comment: Performed at [facility], [address], [place], [place] [address]  Lipase, blood     Status: None   Collection Time: 11/03/18  2:20 PM  Result Value Ref Range   Lipase 36 11 - 51 U/L    Comment: Performed at [facility], [address], [place], [place] [address]  I-Stat Chem 8, ED     Status: Abnormal   Collection Time: 11/03/18  2:41 PM  Result Value Ref Range   Sodium 136 135 - 145 mmol/L   Potassium 4.8 3.5 - 5.1 mmol/L   Chloride 104 98 - 111 mmol/L   BUN 31 (H) 8 - 23 mg/dL   Creatinine, Ser 0.98 (H) 0.61 - 1.24 mg/dL   Glucose, Bld 119 (H) 70 - 99 mg/dL   Calcium, Ion 1.47 8.29 - 1.40 mmol/L   TCO2 21 (L) 22 - 32 mmol/L   Hemoglobin 19.0 (H) 13.0 - 17.0 g/dL   HCT 56.2 (H) 13.0 - 86.5 %  Protime-INR     Status: Abnormal   Collection Time: 11/03/18  4:59 PM  Result Value Ref Range   Prothrombin Time 32.5 (H) 11.4 - 15.2 seconds   INR 3.23     Comment: Performed at [facility], [address] [place], [place] [address]  I-Stat CG4 Lactic Acid, ED     Status: None   Collection Time: 11/03/18  5:41 PM  Result Value Ref Range   Lactic Acid, Venous 1.48 0.5 - 1.9 mmol/L  Comprehensive  metabolic panel     Status: Abnormal   Collection Time: 11/04/18  3:15 AM  Result Value Ref Range   Sodium 137 135 - 145 mmol/L   Potassium 3.5 3.5 - 5.1 mmol/L   Chloride 108 98 - 111 mmol/L   CO2 17 (L) 22 - 32 mmol/L   Glucose, Bld 135 (H) 70 - 99 mg/dL   BUN 32 (H) 8 - 23 mg/dL   Creatinine, Ser 6.29 (H) 0.61 - 1.24 mg/dL   Calcium 8.3 (L) 8.9 - 10.3 mg/dL   Total Protein 6.5 6.5 - 8.1 g/dL   Albumin 3.4 (L) 3.5 - 5.0 g/dL   AST 16 15 - 41 U/L   ALT 13 0 - 44 U/L   Alkaline Phosphatase 49 38 - 126 U/L   Total Bilirubin 1.0 0.3 - 1.2 mg/dL   GFR calc non Af Amer 38 (L) >60 mL/min   GFR calc Af Amer 44 (L) >60 mL/min   Anion gap 12 5 - 15    Comment: Performed at [facility], [address], [place], [place] [address]  Protime-INR     Status: Abnormal   Collection Time: 11/04/18  3:15 AM  Result Value Ref Range   Prothrombin Time 30.3 (H) 11.4 - 15.2 seconds   INR 2.95     Comment: Performed at [facility], [address] [place], [place] [address]  CBC WITH DIFFERENTIAL     Status: Abnormal   Collection Time: 11/04/18  3:15 AM  Result Value Ref Range   WBC 13.9 (H) 4.0 - 10.5 K/uL   RBC 4.53 4.22 - 5.81 MIL/uL   Hemoglobin 14.2 13.0 - 17.0 g/dL    Comment: DELTA CHECK NOTED REPEATED TO VERIFY    HCT 42.7 39.0 - 52.0 %   MCV 94.3 80.0 - 100.0 fL   MCH 31.3 26.0 - 34.0 pg   MCHC 33.3 30.0 - 36.0 g/dL   RDW 10.2 72.5 - 36.6 %   Platelets  246 150 - 400 K/uL   nRBC 0.0 0.0 - 0.2 %   Neutrophils Relative % 85 %   Neutro Abs 11.7 (H) 1.7 - 7.7 K/uL   Lymphocytes Relative 6 %   Lymphs Abs 0.8 0.7 - 4.0 K/uL   Monocytes Relative 7 %   Monocytes Absolute 1.0 0.1 - 1.0 K/uL   Eosinophils Relative 1 %   Eosinophils Absolute 0.1 0.0 - 0.5 K/uL   Basophils Relative 0 %   Basophils Absolute 0.1 0.0 - 0.1 K/uL   Immature Granulocytes 1 %   Abs Immature Granulocytes 0.20 (H) 0.00 - 0.07 K/uL    Comment: Performed at [facility], [address],  [place], [place] [address]  Magnesium     Status: Abnormal   Collection Time: 11/04/18  3:15 AM  Result Value Ref Range   Magnesium 1.3 (L) 1.7 - 2.4 mg/dL    Comment: Performed at [facility], [address], [place], [place] [address]  Basic metabolic panel     Status: Abnormal   Collection Time: 11/04/18  5:54 PM  Result Value Ref Range   Sodium 134 (L) 135 - 145 mmol/L   Potassium 3.4 (L) 3.5 - 5.1 mmol/L   Chloride 105 98 - 111 mmol/L   CO2 19 (L) 22 - 32 mmol/L   Glucose, Bld 112 (H) 70 - 99 mg/dL   BUN 28 (H) 8 - 23 mg/dL   Creatinine, Ser 8.87 (H) 0.61 - 1.24 mg/dL   Calcium 8.0 (L) 8.9 - 10.3 mg/dL   GFR calc non Af Amer 46 (L) >60 mL/min   GFR calc Af Amer 54 (L) >60 mL/min   Anion gap 10 5 - 15    Comment: Performed at [facility], [address], [place], [place] [address]  Magnesium     Status: None   Collection Time: 11/04/18  5:54 PM  Result Value Ref Range   Magnesium 2.4 1.7 - 2.4 mg/dL    Comment: Performed at [facility], [address], [place], [place] [address]  Lactic acid, plasma     Status: None   Collection Time: 11/04/18  5:54 PM  Result Value Ref Range   Lactic Acid, Venous 0.9 0.5 - 1.9 mmol/L    Comment: Performed at [facility], [address] [place], [place] [address]  CBC     Status: Abnormal   Collection Time: 11/04/18  5:54 PM  Result Value Ref Range   WBC 12.4 (H) 4.0 - 10.5 K/uL   RBC 4.31 4.22 - 5.81 MIL/uL   Hemoglobin 13.9 13.0 - 17.0 g/dL   HCT 68.2 57.4 - 93.5 %   MCV 95.8 80.0 - 100.0 fL   MCH 32.3 26.0 - 34.0 pg   MCHC 33.7 30.0 - 36.0 g/dL   RDW 52.1 74.7 - 15.9 %   Platelets 256 150 - 400 K/uL   nRBC 0.0 0.0 - 0.2 %    Comment: Performed at [facility], [address], [place], [place] [address]  Protime-INR     Status: Abnormal   Collection Time: 11/05/18  3:42 AM  Result Value Ref Range   Prothrombin Time 25.0 (H) 11.4 - 15.2 seconds   INR 2.30     Comment: Performed at [facility]  Lab, [address] [place], [place] [address]  CBC     Status: None   Collection Time: 11/05/18  3:42 AM  Result Value Ref Range   WBC 9.7 4.0 - 10.5 K/uL   RBC 4.33 4.22 - 5.81 MIL/uL   Hemoglobin 14.3 13.0 - 17.0 g/dL   HCT 82.942.0 56.239.0 - 13.052.0 %   MCV 97.0 80.0 - 100.0 fL   MCH 33.0 26.0 - 34.0 pg   MCHC 34.0 30.0 - 36.0 g/dL   RDW 86.514.7 78.411.5 - 69.615.5 %   Platelets 230 150 - 400 K/uL   nRBC 0.0 0.0 - 0.2 %    Comment: Performed at [facility], [address] [place], [place] [address]  Basic metabolic panel     Status: Abnormal   Collection Time: 11/05/18  3:42 AM  Result Value Ref Range   Sodium 139 135 - 145 mmol/L   Potassium 3.4 (L) 3.5 - 5.1 mmol/L   Chloride 106 98 - 111 mmol/L   CO2 22 22 - 32 mmol/L   Glucose, Bld 89 70 - 99 mg/dL   BUN 23 8 - 23 mg/dL   Creatinine, Ser 4.131.28 (H) 0.61 - 1.24 mg/dL   Calcium 8.2 (L) 8.9 - 10.3 mg/dL   GFR calc non Af Amer 55 (L) >60 mL/min   GFR calc Af Amer >60 >60 mL/min   Anion gap 11 5 - 15    Comment: Performed at [facility], [address] [place], [place] [address]    Radiology/Results: Ct Abdomen Pelvis W Contrast  Result Date: 11/03/2018 CLINICAL DATA:  Abdominal pain and vomiting.  Abdominal distention. EXAM: CT ABDOMEN AND PELVIS WITH CONTRAST TECHNIQUE: Multidetector CT imaging of the abdomen and pelvis was performed using the standard protocol following bolus administration of intravenous contrast. CONTRAST:  80mL OMNIPAQUE IOHEXOL 300 MG/ML  SOLN COMPARISON:  03/05/2018 FINDINGS: Lower chest: No acute abnormalities. Stable scarring at both lung bases. Aortic atherosclerosis. Heart size is normal. Hepatobiliary: Stable 19 mm cyst in the posterior aspect of the dome of the right lobe of the liver. Liver and biliary tree are otherwise normal. Pancreas: Unremarkable. No pancreatic ductal dilatation or surrounding inflammatory changes. Spleen: Normal in size without focal abnormality. Adrenals/Urinary Tract: Adrenal glands  are normal. Stable bilateral renal cysts. No hydronephrosis. Bladder is normal. Stomach/Bowel: The stomach and proximal small bowel are markedly distended. Distal small bowel is not distended. Colon is redundant but otherwise normal. Appendix has been removed. 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 Extensive aortic atherosclerosis. 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 Reproductive: Slight enlargement of the prostate gland. Other: No ascites.  No abdominal wall hernias. Musculoskeletal: No acute or significant osseous findings. IMPRESSION: Findings consistent with early complete or partial small bowel obstruction with marked distention of the stomach with dilatation of the proximal jejunum. No discrete transition point. 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 Electronically Signed   By: Francene BoyersJames  Maxwell M.D.   On: 11/03/2018 16:14   Dg Abd Portable 1v-small Bowel Obstruction Protocol-initial, 8 Hr Delay  Result Date: 11/04/2018 CLINICAL DATA:  Small-bowel obstruction.  Follow-up exam. EXAM: PORTABLE ABDOMEN - 1 VIEW COMPARISON:  11/03/2018 FINDINGS: There is contrast within the stomach. There may be some diluted contrast within proximal small bowel, but this is not definitive. No small bowel dilation is visualized. Is air mixed with stool in the colon. Nasogastric tube tip lies within the proximal stomach. No acute findings noted in the lungs. IMPRESSION: 1. Contrast is seen within the stomach, with little of any noted  in small bowel. 2. No radiographic bowel dilation to indicate significant bowel obstruction. Electronically Signed   By: Amie Portland M.D.   On: 11/04/2018 09:41   Dg Abd Portable 1v-small Bowel Protocol-position Verification  Result Date: 11/03/2018 CLINICAL DATA:  73 year old male with suspected small bowel obstruction on CT Abdomen and Pelvis earlier today. Initial image 4 enteric contrast  radiographic series. EXAM: PORTABLE ABDOMEN - 1 VIEW COMPARISON:  CT Abdomen and Pelvis 1547 hours today and earlier. FINDINGS: Portable AP supine view at 2306 hours. The tip of an enteric tube projects over the proximal stomach (arrow). Stable bowel gas pattern. Excreted IV contrast in the urinary bladder. No acute osseous abnormality identified. IMPRESSION: Enteric tube projects over the proximal stomach. Stable bowel gas pattern. Electronically Signed   By: Odessa Fleming M.D.   On: 11/03/2018 23:40    Anti-infectives: Anti-infectives (From admission, onward)   None      Assessment/Plan: Problem List: Patient Active Problem List   Diagnosis Date Noted  . Small bowel obstruction (HCC) 11/03/2018  . History of pulmonary embolism 02/09/2016  . Acute GI bleeding   . Anemia due to blood loss, acute   . GI bleed 04/08/2015  . GIB (gastrointestinal bleeding) 04/08/2015  . AKI (acute kidney injury) (HCC) 04/08/2015  . Empyema lung (HCC)   . Loculated pleural effusion   . Pneumothorax   . Empyema, right (HCC) 11/25/2014  . Persistent right pleural effusion and right lower lobe infiltrate 11/14/2014  . HCAP (healthcare-associated pneumonia) 10/10/2014  . Peripheral vascular disease (HCC) 10/10/2014  . Anticoagulated on Coumadin 10/10/2014  . Supratherapeutic INR 10/10/2014  . Microcytic anemia 09/18/2013  . CAP (community acquired pneumonia) 09/09/2013  . AAA (abdominal aortic aneurysm) (HCC) 08/19/2013  . 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 Malignant HTN with heart disease, w/o CHF, with chronic kidney disease 08/19/2013  . DVT, lower extremity and pulmonary embolism, recurrent 08/19/2013  . Hyperlipidemia 08/19/2013  . Gout 08/19/2013  . COPD (chronic obstructive pulmonary disease) (HCC) 08/19/2013  . OSA (obstructive sleep apnea) 08/19/2013  . Abnormal nuclear stress test 08/19/2013  . Bilateral renal artery stenosis (HCC) 08/19/2013  . DVT (deep venous thrombosis) (HCC)  06/28/2012    Labs look better today.  Exam still suggesting pSBO.  Continue observation and Dr. Margo Aye to evaluate the urinary symptoms.   * No surgery found *    LOS: 2 days   Matt B. Daphine Deutscher, MD, [facility] [phone] beeper [phone]  11/05/2018 8:57 AM

## 2018-11-06 ENCOUNTER — Inpatient Hospital Stay (HOSPITAL_COMMUNITY): Payer: Medicare Other

## 2018-11-06 LAB — BASIC METABOLIC PANEL
Anion gap: 13 (ref 5–15)
BUN: 16 mg/dL (ref 8–23)
CO2: 22 mmol/L (ref 22–32)
Calcium: 8.3 mg/dL — ABNORMAL LOW (ref 8.9–10.3)
Chloride: 104 mmol/L (ref 98–111)
Creatinine, Ser: 1.26 mg/dL — ABNORMAL HIGH (ref 0.61–1.24)
GFR calc Af Amer: >60 mL/min (ref >60)
GFR calc non Af Amer: 56 mL/min — ABNORMAL LOW (ref >60)
Glucose, Bld: 94 mg/dL (ref 70–99)
Potassium: 3.3 mmol/L — ABNORMAL LOW (ref 3.5–5.1)
Sodium: 139 mmol/L (ref 135–145)

## 2018-11-06 LAB — CBC
HEMATOCRIT: 39.5 % (ref 39.0–52.0)
Hemoglobin: 13.2 g/dL (ref 13.0–17.0)
MCH: 31.7 pg (ref 26.0–34.0)
MCHC: 33.4 g/dL (ref 30.0–36.0)
MCV: 95 fL (ref 80.0–100.0)
Platelets: 204 10*3/uL (ref 150–400)
RBC: 4.16 MIL/uL — ABNORMAL LOW (ref 4.22–5.81)
RDW: 14.4 % (ref 11.5–15.5)
WBC: 8 10*3/uL (ref 4.0–10.5)
nRBC: 0 % (ref 0.0–0.2)

## 2018-11-06 LAB — HEPARIN LEVEL (UNFRACTIONATED)
Heparin Unfractionated: <0.1 IU/mL — ABNORMAL LOW (ref 0.30–0.70)
Heparin Unfractionated: 0.25 IU/mL — ABNORMAL LOW (ref 0.30–0.70)
Heparin Unfractionated: 0.29 IU/mL — ABNORMAL LOW (ref 0.30–0.70)

## 2018-11-06 LAB — PROTIME-INR
INR: 1.86
Prothrombin Time: 21.2 seconds — ABNORMAL HIGH (ref 11.4–15.2)

## 2018-11-06 MED ORDER — POTASSIUM CHLORIDE 10 MEQ/100ML IV SOLN
10.0000 meq | INTRAVENOUS | Status: AC
  Administered 2018-11-06: 10 meq via INTRAVENOUS
  Filled 2018-11-06: qty 100

## 2018-11-06 MED ORDER — SORBITOL 70 % SOLN
960.0000 mL | TOPICAL_OIL | Freq: Once | ORAL | Status: AC
  Administered 2018-11-06: 960 mL via RECTAL
  Filled 2018-11-06 (×3): qty 473

## 2018-11-06 NOTE — Progress Notes (Signed)
ANTICOAGULATION CONSULT NOTE   Pharmacy Consult for Heparin Indication: recurrent PE/DVTs, when INR <2  Allergies  Allergen Reactions  . Codeine Hives and Other (See Comments)    Takes benadryl to stop reaction    Patient Measurements: Height: 6\' 1"  (185.4 cm) Weight: 234 lb 2.1 oz (106.2 kg) IBW/kg (Calculated) : 79.9 Heparin Dosing Weight: 102 kg  Vital Signs:    Labs: Recent Labs    11/04/18 1754 11/05/18 0342 11/05/18 1616 11/06/18 0147  HGB 13.9 14.3  --  13.2  HCT 41.3 42.0  --  39.5  PLT 256 230  --  204  LABPROT  --  25.0* 21.9* 21.2*  INR  --  2.30 1.94 1.86  HEPARINUNFRC  --   --   --  <0.10*  CREATININE 1.48* 1.28*  --  1.26*    Estimated Creatinine Clearance: 66.8 mL/min (A) (by C-G formula based on SCr of 1.26 mg/dL (H)).   Medical History: Past Medical History:  Diagnosis Date  . AAA (abdominal aortic aneurysm) (HCC)   . 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    [name] COPD (chronic obstructive pulmonary disease) (HCC)   . DJD (degenerative joint disease)   . Dyslipidemia   . Gout   . Malignant hypertension   . Peripheral vascular disease (HCC)   . Pneumonia 08/2013   pt. reports that he is having this surgery 11/25/2014- for the lung problem that began with pneumonia in 09/2014  . Shortness of breath   . Venous thrombosis    Recurrent     Assessment: 73 year old male on chronic Coumadin for recurrent DVT and PE now NPO due to SBO. Pharmacy consulted to start IV Heparin as bridge while NPO.   Currently INR in therapeutic range at 2.30 but trending down. Continuing hold Heparin start until INR <2 - will recheck INR this PM to re-evaluate ability to start IV Heparin. Last dose Coumadin was 11/02/18. Home regimen was 7mg  daily.   1/20 AM update: initial heparin level low, no issues per RN, CBC ok, INR 1.86  Goal of Therapy:  INR 2-3 Heparin level 0.3-0.7 units/ml Monitor platelets  by anticoagulation protocol: Yes   Plan:  -Inc heparin to 1700 units/hr -Re-check heparin level in 8 hours  Abran Duke, PharmD, BCPS Clinical Pharmacist Phone: [phone]

## 2018-11-06 NOTE — Progress Notes (Signed)
ANTICOAGULATION CONSULT NOTE   Pharmacy Consult:  Heparin Indication:  Recurrent VTEs  Allergies  Allergen Reactions  . Codeine Hives and Other (See Comments)    Takes benadryl to stop reaction    Patient Measurements: Height: 6\' 1"  (185.4 cm) Weight: 234 lb 2.1 oz (106.2 kg) IBW/kg (Calculated) : 79.9 Heparin Dosing Weight: 102 kg  Vital Signs: Temp: 98.4 F (36.9 C) (01/20 0458) Temp Source: Oral (01/20 0458) BP: 131/76 (01/20 0458) Pulse Rate: 97 (01/20 0458)  Labs: Recent Labs    11/04/18 1754 11/05/18 0342 11/05/18 1616 11/06/18 0147 11/06/18 1232  HGB 13.9 14.3  --  13.2  --   HCT 41.3 42.0  --  39.5  --   PLT 256 230  --  204  --   LABPROT  --  25.0* 21.9* 21.2*  --   INR  --  2.30 1.94 1.86  --   HEPARINUNFRC  --   --   --  <0.10* 0.25*  CREATININE 1.48* 1.28*  --  1.26*  --     Estimated Creatinine Clearance: 66.8 mL/min (A) (by C-G formula based on SCr of 1.26 mg/dL (H)).   Assessment: 72 YOM on chronic Coumadin for recurrent DVT and PE.  Pharmacy consulted to dose IV heparin as bridge while NPO.  Heparin level is sub-therapeutic but trending up from previous rate increase.  No issue with infusion per RN.  No bleeding reported.   Goal of Therapy:  INR 2-3 Heparin level 0.3-0.7 units/ml Monitor platelets by anticoagulation protocol: Yes   Plan:  Increase heparin gtt to 1900 units/hr   Check 8 hr heparin level Daily heparin level and CBC   Lilie Vezina D. Laney Potash, PharmD, BCPS, BCCCP 11/06/2018, 1:28 PM

## 2018-11-06 NOTE — Progress Notes (Signed)
PROGRESS NOTE  Tony Zhang JWJ:[id number] DOB: 09-29-1946 DOA: 11/03/2018 PCP: [name], [name], MD  HPI/Recap of past 24 hours: Tony Zhang is a 73 y.o. 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 Presents with complaints of 3-day history of nausea and vomiting.  Associated with significant abdominal pain and distention.  CT scan of the abdomen pelvis w contrast positive for small bowel obstruction.  General surgery consulted and following.   Hospital course complicated by dysuria which was present prior to admission.  Started on Rocephin empirically.  Urine culture is pending.   11/06/2018: Patient seen and examined at bedside.  No acute events overnight.  Reported abdominal pain is improving.  Has intermittent nausea with no vomiting.  NG tube in place with persistent output.  General surgery following.  Assessment/Plan: Principal Problem:   Small bowel obstruction (HCC) Active Problems:   AAA (abdominal aortic aneurysm) (HCC)   Chronic distal aortic occlusion (HCC)   DVT, lower extremity and pulmonary embolism, recurrent   Hyperlipidemia   COPD (chronic obstructive pulmonary disease) (HCC)   OSA (obstructive sleep apnea)   Peripheral vascular disease (HCC)   Anticoagulated on Coumadin  Sepsis secondary to small bowel obstruction versus UTI, both present on admission Presented with abdominal pain, distention, leukocytosis with WBC 21,000, heart rate of 111 and tachypnea with respiratory rate of 29. CT abdomen and pelvis with contrast done on 11/03/2018 revealed findings consistent with early complete or partial small bowel obstruction with marked distention of the stomach and jejunum. Self reports prior abdominal surgeries from cholecystectomy and hernia repair  Now positive UA with dysuria and polyuria Continue Rocephin daily, day  #2 Sepsis criteria is resolving  Urine culture positive greater than 100,000 colonies of staph epidermidis Continue n.p.o. Continue to monitor output from NG tube suction Continue IV fluids, IV Zofran, pain management with IV fentanyl as needed  UTI, present on admission Management as stated above  AKI on CKD 3, improving Anion gap metabolic acidosis has resolved, normal chemistry bicarb today Suspect prerenal Appears to be back to his baseline with creatinine of 1.2 and GFR 55 Continue to avoid nephrotoxic agents/hypotension Continue IV fluid Repeat BMP in the morning Continue to monitor urine output   Resolved non anion gap metabolic acidosis  Resolved severe hypomagnesemia post repletion Presented with magnesium of 1.3 which was repleted  Hypokalemia Potassium 3.3 Repleted with IV potassium Repeat BMP in the morning  Recurrent DVT and PE. Patient is on chronic anticoagulation with warfarin. Coumadin is on hold INR 1.86 on 11/06/2018 Heparin drip until cleared from general surgery Pharmacy is managing heparin and Coumadin  Chronic distal aortic occlusion Diagnosed 5 years ago Seen again on CT abdomen and pelvis with contrast on admission  History of upper GI bleed Self-reported had an endoscopy in the past and treated for upper GI bleed Hemoglobin is stable No sign of overt bleeding  Peripheral vascular disease. Patient is on Pletal at home. Currently on hold as patient is n.p.o. Monitor.  Bilateral renal artery stenosis Diagnosed 5 years ago  History of COPD. Continue inhalers. Does not appear to be having any exacerbation right now.  Body mass index is 31 kg/m.  Obesity. OSA. Will discussed the patient regarding use of CPAP outpatient  Chronic pain syndrome - [place] Controlled Substance Reporting System database was reviewed. - Last prescription for controlled substance was on December 2019 for Norco. -  Continue here in the hospital  once patient is able to take p.o.  Risk: High risk for decompensation due to ongoing small bowel obstruction, severe dyselectrolytemia, multiple comorbidities, and advanced age.  Nutrition: NPO DVT Prophylaxis: Heparin drip  Advance goals of care discussion: Full code   Consults: General surgery   Family Communication: no family was present at bedside, at the time of interview.  Disposition: DC home when general surgery signs off.  Objective: Vitals:   11/05/18 1422 11/05/18 2130 11/06/18 0458 11/06/18 1441  BP: (!) 122/58 (!) 141/69 131/76 (!) 164/94  Pulse: 94 99 97 (!) 106  Resp:  20 20 16   Temp: 98.7 F (37.1 C) 98.7 F (37.1 C) 98.4 F (36.9 C) 98.6 F (37 C)  TempSrc: Oral Oral Oral Oral  SpO2: 94% 94% 94%   Weight:      Height:        Intake/Output Summary (Last 24 hours) at 11/06/2018 1450 Last data filed at 11/06/2018 1201 Gross per 24 hour  Intake 1073.84 ml  Output 1000 ml  Net 73.84 ml   Filed Weights   11/03/18 1350 11/03/18 2043  Weight: 106.6 kg 106.2 kg    Exam:  . General: 73 y.o. year-old male well-developed well-nourished no acute distress.  Alert and oriented x3.  NG tube to suction in place.   . Cardiovascular: Regular rate and rhythm with no rubs or gallops.  No JVD or thyromegaly noted. [name] Respiratory: Clear to Auscultation with No Wheezes or Rales.  Good Inspiratory Effort. . Abdomen: Abdomen is distended with no notable bowel sounds.  Less tenderness on palpation.. . Musculoskeletal: Trace lower extremity edema. 2/4 pulses in all 4 extremities. [name] Psychiatry: Mood is appropriate for condition and setting   Data Reviewed: CBC: Recent Labs  Lab 11/03/18 1420 11/03/18 1441 11/04/18 0315 11/04/18 1754 11/05/18 0342 11/06/18 0147  WBC 21.2*  --  13.9* 12.4* 9.7 8.0  NEUTROABS 18.4*  --  11.7*  --   --   --   HGB 18.4* 19.0* 14.2 13.9 14.3 13.2  HCT 53.7* 56.0* 42.7 41.3 42.0 39.5  MCV 95.9  --  94.3 95.8 97.0 95.0  PLT 434*  --   246 256 230 204   Basic Metabolic Panel: Recent Labs  Lab 11/03/18 1420 11/03/18 1441 11/04/18 0315 11/04/18 1754 11/05/18 0342 11/06/18 0147  NA 135 136 137 134* 139 139  K 4.7 4.8 3.5 3.4* 3.4* 3.3*  CL 102 104 108 105 106 104  CO2 19*  --  17* 19* 22 22  GLUCOSE 157* 153* 135* 112* 89 94  BUN 28* 31* 32* 28* 23 16  CREATININE 2.07* 1.90* 1.75* 1.48* 1.28* 1.26*  CALCIUM 10.0  --  8.3* 8.0* 8.2* 8.3*  MG  --   --  1.3* 2.4  --   --    GFR: Estimated Creatinine Clearance: 66.8 mL/min (A) (by C-G formula based on SCr of 1.26 mg/dL (H)). Liver Function Tests: Recent Labs  Lab 11/03/18 1420 11/04/18 0315  AST 25 16  ALT 14 13  ALKPHOS 51 49  BILITOT 1.1 1.0  PROT 8.1 6.5  ALBUMIN 4.5 3.4*   Recent Labs  Lab 11/03/18 1420  LIPASE 36   No results for input(s): AMMONIA in the last 168 hours. Coagulation Profile: Recent Labs  Lab 11/03/18 1659 11/04/18 0315 11/05/18 0342 11/05/18 1616 11/06/18 0147  INR 3.23 2.95 2.30 1.94 1.86   Cardiac Enzymes: No results for input(s): CKTOTAL, CKMB, CKMBINDEX, TROPONINI  in the last 168 hours. BNP (last 3 results) No results for input(s): PROBNP in the last 8760 hours. HbA1C: No results for input(s): HGBA1C in the last 72 hours. CBG: No results for input(s): GLUCAP in the last 168 hours. Lipid Profile: No results for input(s): CHOL, HDL, LDLCALC, TRIG, CHOLHDL, LDLDIRECT in the last 72 hours. Thyroid Function Tests: No results for input(s): TSH, T4TOTAL, FREET4, T3FREE, THYROIDAB in the last 72 hours. Anemia Panel: No results for input(s): VITAMINB12, FOLATE, FERRITIN, TIBC, IRON, RETICCTPCT in the last 72 hours. Urine analysis:    Component Value Date/Time   COLORURINE YELLOW 11/05/2018 0927   APPEARANCEUR HAZY (A) 11/05/2018 0927   LABSPEC 1.017 11/05/2018 0927   PHURINE 6.0 11/05/2018 0927   GLUCOSEU NEGATIVE 11/05/2018 0927   HGBUR SMALL (A) 11/05/2018 0927   BILIRUBINUR NEGATIVE 11/05/2018 0927   KETONESUR  NEGATIVE 11/05/2018 0927   PROTEINUR NEGATIVE 11/05/2018 0927   UROBILINOGEN 0.2 04/08/2015 2033   NITRITE POSITIVE (A) 11/05/2018 0927   LEUKOCYTESUR LARGE (A) 11/05/2018 0927   Sepsis Labs: @LABRCNTIP (procalcitonin:4,lacticidven:4)  ) Recent Results (from the past 240 hour(s))  Culture, Urine     Status: Abnormal (Preliminary result)   Collection Time: 11/05/18  9:22 AM  Result Value Ref Range Status   Specimen Description URINE, CLEAN CATCH  Final   Special Requests   Final    Normal Performed at [facility], [address] [place], [place] [address]    Culture >=100,000 COLONIES/mL STAPHYLOCOCCUS EPIDERMIDIS (A)  Final   Report Status PENDING  Incomplete      Studies: Dg Abd 2 Views  Result Date: 11/06/2018 CLINICAL DATA:  Small-bowel obstruction.  Abdominal distension. EXAM: ABDOMEN - 2 VIEW COMPARISON:  11/05/2018 FINDINGS: Persistent moderate stool containing barium/contrast throughout the colon and down into the rectum. There are few air-filled loops of small bowel but no distension or air-fluid levels. No free air on the decubitus film. The soft tissue shadows of the abdomen are grossly maintained. Bony structures are intact. IMPRESSION: Contrast throughout the colon persists. No findings for persistent small bowel obstruction or free air. Electronically Signed   By: Rudie MeyerP.  Gallerani M.D.   On: 11/06/2018 12:25    Scheduled Meds: . sodium chloride flush  3 mL Intravenous Q12H  . sorbitol, milk of mag, mineral oil, glycerin (SMOG) enema  960 mL Rectal Once    Continuous Infusions: . cefTRIAXone (ROCEPHIN)  IV 1 g (11/06/18 1128)  . dextrose 5% lactated ringers with KCl 20 mEq/L 50 mL/hr at 11/06/18 0418  . heparin 1,900 Units/hr (11/06/18 1358)  . sodium chloride       LOS: 3 days     Darlin Droparole N Verdis Koval, MD [facility] Pager [phone]  If 7PM-7AM, please contact night-coverage [email or web address] Password [email or web address] 11/06/2018, 2:50 PM

## 2018-11-06 NOTE — Progress Notes (Signed)
ANTICOAGULATION CONSULT NOTE   Pharmacy Consult for Heparin Indication: recurrent PE/DVTs, when INR <2  Allergies  Allergen Reactions  . Codeine Hives and Other (See Comments)    Takes benadryl to stop reaction    Patient Measurements: Height: 6\' 1"  (185.4 cm) Weight: 234 lb 2.1 oz (106.2 kg) IBW/kg (Calculated) : 79.9 Heparin Dosing Weight: 102 kg  Vital Signs: Temp: 98.5 F (36.9 C) (01/20 2131) Temp Source: Oral (01/20 2131) BP: 151/75 (01/20 2131) Pulse Rate: 94 (01/20 2131)  Labs: Recent Labs    11/04/18 1754 11/05/18 0342 11/05/18 1616 11/06/18 0147 11/06/18 1232 11/06/18 2210  HGB 13.9 14.3  --  13.2  --   --   HCT 41.3 42.0  --  39.5  --   --   PLT 256 230  --  204  --   --   LABPROT  --  25.0* 21.9* 21.2*  --   --   INR  --  2.30 1.94 1.86  --   --   HEPARINUNFRC  --   --   --  <0.10* 0.25* 0.29*  CREATININE 1.48* 1.28*  --  1.26*  --   --     Estimated Creatinine Clearance: 66.8 mL/min (A) (by C-G formula based on SCr of 1.26 mg/dL (H)).   Medical History: Past Medical History:  Diagnosis Date  . AAA (abdominal aortic aneurysm) (HCC)   . Blood dyscrasia    followed by Dr. [name], for clotting issue, has been on Coumadin for about 20 yrs.    [name]. COPD (chronic obstructive pulmonary disease) (HCC)   . DJD (degenerative joint disease)   . Dyslipidemia   . Gout   . Malignant hypertension   . Peripheral vascular disease (HCC)   . Pneumonia 08/2013   pt. reports that he is having this surgery 11/25/2014- for the lung problem that began with pneumonia in 09/2014  . Shortness of breath   . Venous thrombosis    Recurrent     Assessment: 73 year old male on chronic Coumadin for recurrent DVT and PE now NPO due to SBO. Pharmacy consulted to start IV Heparin as bridge while NPO.   Currently INR in therapeutic range at 2.30 but trending down. Continuing hold Heparin start until INR <2 - will recheck INR this PM to re-evaluate ability to start IV Heparin.  Last dose Coumadin was 11/02/18. Home regimen was 7mg  daily.   1/20 PM update: heparin level just below goal  Goal of Therapy:  INR 2-3 Heparin level 0.3-0.7 units/ml Monitor platelets by anticoagulation protocol: Yes   Plan:  -Inc heparin to 2100 units/hr -Re-check heparin level with AM labs  Abran DukeJames Refugio Vandevoorde, PharmD, BCPS Clinical Pharmacist Phone: [phone]

## 2018-11-06 NOTE — Progress Notes (Signed)
Patient ID: Tony Zhang, male   DOB: June 25, 1946, 73 y.o.   MRN: [id number]       Subjective: Patient states he feels better, but he denies flatus or a BM.  still with over 500cc of NGT output since yesterday.  Objective: Vital signs in last 24 hours: Temp:  [98.4 F (36.9 C)-98.7 F (37.1 C)] 98.4 F (36.9 C) (01/20 0458) Pulse Rate:  [94-99] 97 (01/20 0458) Resp:  [20] 20 (01/20 0458) BP: (122-141)/(58-76) 131/76 (01/20 0458) SpO2:  [94 %] 94 % (01/20 0458) Last BM Date: 11/04/18  Intake/Output from previous day: 01/19 0701 - 01/20 0700 In: 1173.8 [I.V.:943.8; NG/GT:30; IV Piggyback:200] Out: 1150 [Urine:650; Emesis/NG output:500] Intake/Output this shift: No intake/output data recorded.  PE: Abd: soft, but still fairly tender diffusely to palpation, hypoactive BS, NGT with some feculent chunky appearing output in NGT cannister.  I changed the cannister to a new on to really see if it is still as feculent or if something of this is just old.  Lab Results:  Recent Labs    11/05/18 0342 11/06/18 0147  WBC 9.7 8.0  HGB 14.3 13.2  HCT 42.0 39.5  PLT 230 204   BMET Recent Labs    11/05/18 0342 11/06/18 0147  NA 139 139  K 3.4* 3.3*  CL 106 104  CO2 22 22  GLUCOSE 89 94  BUN 23 16  CREATININE 1.28* 1.26*  CALCIUM 8.2* 8.3*   PT/INR Recent Labs    11/05/18 1616 11/06/18 0147  LABPROT 21.9* 21.2*  INR 1.94 1.86   CMP     Component Value Date/Time   NA 139 11/06/2018 0147   K 3.3 (L) 11/06/2018 0147   CL 104 11/06/2018 0147   CO2 22 11/06/2018 0147   GLUCOSE 94 11/06/2018 0147   BUN 16 11/06/2018 0147   CREATININE 1.26 (H) 11/06/2018 0147   CREATININE 1.56 (H) 09/17/2013 1135   CALCIUM 8.3 (L) 11/06/2018 0147   PROT 6.5 11/04/2018 0315   ALBUMIN 3.4 (L) 11/04/2018 0315   AST 16 11/04/2018 0315   ALT 13 11/04/2018 0315   ALKPHOS 49 11/04/2018 0315   BILITOT 1.0 11/04/2018 0315   GFRNONAA 56 (L) 11/06/2018 0147   GFRAA >60 11/06/2018 0147    Lipase     Component Value Date/Time   LIPASE 36 11/03/2018 1420       Studies/Results: Dg Abd Portable 1v  Result Date: 11/05/2018 CLINICAL DATA:  Small bowel obstruction. EXAM: PORTABLE ABDOMEN - 1 VIEW COMPARISON:  November 04, 2018 FINDINGS: The distal tip of the NG tube terminates just below the GE junction. No change in the lung bases. No free air, portal venous gas, or pneumatosis identified. Contrast is now seen within the colon. A mildly prominent air-filled loop of bowel in the lower abdomen is probably colonic. No definitive small bowel dilatation noted. IMPRESSION: 1. The distal tip of the NG tube appears to be just below the GE junction. Consider advancement. 2. Contrast is now seen in the colon. An air-filled loop of bowel in the lower mid abdomen is likely colonic. No definitive small bowel dilatation seen on this study. Electronically Signed   By: Gerome Samavid  Williams III M.D   On: 11/05/2018 10:24    Anti-infectives: Anti-infectives (From admission, onward)   Start     Dose/Rate Route Frequency Ordered Stop   11/05/18 1230  cefTRIAXone (ROCEPHIN) 1 g in sodium chloride 0.9 % 100 mL IVPB     1 g 200 mL/hr  over 30 Minutes Intravenous Every 24 hours 11/05/18 1202         Assessment/Plan MMP  SBO -SBO protocol completed and contrast noted in his colon on delayed films; however, he is still tender on abdominal exam and his NGT output appears still feculent and foul smelling -check 2 view films today as he has not passed flatus or had a BM yet either  FEN - NPO/NGT VTE - heparin gtt ID - rocephin   LOS: 3 days    Letha CapeKelly E Karol Skarzynski , [facility] 11/06/2018, 10:54 AM Pager: [phone]

## 2018-11-07 ENCOUNTER — Encounter (HOSPITAL_COMMUNITY): Payer: Self-pay

## 2018-11-07 ENCOUNTER — Inpatient Hospital Stay (HOSPITAL_COMMUNITY): Payer: Medicare Other

## 2018-11-07 LAB — BASIC METABOLIC PANEL
Anion gap: 13 (ref 5–15)
BUN: 12 mg/dL (ref 8–23)
CO2: 19 mmol/L — AB (ref 22–32)
Calcium: 9 mg/dL (ref 8.9–10.3)
Chloride: 106 mmol/L (ref 98–111)
Creatinine, Ser: 1.14 mg/dL (ref 0.61–1.24)
GFR calc Af Amer: >60 mL/min (ref >60)
GLUCOSE: 103 mg/dL — AB (ref 70–99)
Potassium: 3.9 mmol/L (ref 3.5–5.1)
Sodium: 138 mmol/L (ref 135–145)

## 2018-11-07 LAB — HEPARIN LEVEL (UNFRACTIONATED)
Heparin Unfractionated: 0.33 IU/mL (ref 0.30–0.70)
Heparin Unfractionated: 0.32 IU/mL (ref 0.30–0.70)

## 2018-11-07 LAB — CBC
HEMATOCRIT: 44.6 % (ref 39.0–52.0)
Hemoglobin: 15 g/dL (ref 13.0–17.0)
MCH: 32.3 pg (ref 26.0–34.0)
MCHC: 33.6 g/dL (ref 30.0–36.0)
MCV: 96.1 fL (ref 80.0–100.0)
Platelets: 190 10*3/uL (ref 150–400)
RBC: 4.64 MIL/uL (ref 4.22–5.81)
RDW: 14.5 % (ref 11.5–15.5)
WBC: 7.8 10*3/uL (ref 4.0–10.5)
nRBC: 0 % (ref 0.0–0.2)

## 2018-11-07 LAB — URINE CULTURE
Culture: >=100000 — AB
Special Requests: NORMAL

## 2018-11-07 LAB — PROTIME-INR
INR: 1.56
PROTHROMBIN TIME: 18.5 s — AB (ref 11.4–15.2)

## 2018-11-07 MED ORDER — SORBITOL 70 % SOLN
960.0000 mL | TOPICAL_OIL | Freq: Once | ORAL | Status: AC
  Administered 2018-11-07: 960 mL via RECTAL
  Filled 2018-11-07: qty 473

## 2018-11-07 MED ORDER — PROMETHAZINE HCL 25 MG/ML IJ SOLN
12.5000 mg | Freq: Four times a day (QID) | INTRAMUSCULAR | Status: DC | PRN
  Administered 2018-11-07 – 2018-11-11 (×5): 12.5 mg via INTRAVENOUS
  Filled 2018-11-07 (×5): qty 1

## 2018-11-07 NOTE — Plan of Care (Signed)
  Problem: Education: Goal: Knowledge of General Education information will improve Description Including pain rating scale, medication(s)/side effects and non-pharmacologic comfort measures Outcome: Progressing   Problem: Clinical Measurements: Goal: Ability to maintain clinical measurements within normal limits will improve Outcome: Progressing Goal: Will remain free from infection 11/07/2018 0350 by Hurshel Party, RN Outcome: Progressing 11/07/2018 0022 by Hurshel Party, RN Outcome: Progressing Goal: Cardiovascular complication will be avoided Outcome: Progressing   Problem: Activity: Goal: Risk for activity intolerance will decrease Outcome: Progressing   Problem: Nutrition: Goal: Adequate nutrition will be maintained 11/07/2018 0350 by Hurshel Party, RN Outcome: Progressing 11/07/2018 0022 by Hurshel Party, RN Outcome: Progressing   Problem: Coping: Goal: Level of anxiety will decrease Outcome: Progressing   Problem: Elimination: Goal: Will not experience complications related to bowel motility Outcome: Progressing Goal: Will not experience complications related to urinary retention Outcome: Progressing   Problem: Pain Managment: Goal: General experience of comfort will improve 11/07/2018 0350 by Hurshel Party, RN Outcome: Progressing 11/07/2018 0022 by Hurshel Party, RN Outcome: Progressing   Problem: Safety: Goal: Ability to remain free from injury will improve 11/07/2018 0350 by Hurshel Party, RN Outcome: Progressing 11/07/2018 0022 by Hurshel Party, RN Outcome: Progressing   Problem: Skin Integrity: Goal: Risk for impaired skin integrity will decrease Outcome: Progressing

## 2018-11-07 NOTE — Progress Notes (Signed)
PROGRESS NOTE  Tony Zhang ZOX:[id number] DOB: Dec 09, 1945 DOA: 11/03/2018 PCP: [name], [name], MD  HPI/Recap of past 24 hours: Tony MinorsJerry F Jun is a 73 y.o. 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 Presents with complaints of 3-day history of nausea and vomiting.  Associated with significant abdominal pain and distention.  CT scan of the abdomen pelvis w contrast positive for small bowel obstruction.  General surgery consulted and following.   Hospital course complicated by dysuria which was present prior to admission.  Started on Rocephin empirically.  Urine culture is pending.   11/06/2018: Patient seen and examined at bedside.  No acute events overnight.  Reported abdominal pain is improving.  Has intermittent nausea with no vomiting.  NG tube in place with persistent output.  General surgery following.  11/07/2018: Patient seen.  NG to low suction in situ.  Surgical input is highly appreciated.  Assessment/Plan: Principal Problem:   Small bowel obstruction (HCC) Active Problems:   AAA (abdominal aortic aneurysm) (HCC)   Chronic distal aortic occlusion (HCC)   DVT, lower extremity and pulmonary embolism, recurrent   Hyperlipidemia   COPD (chronic obstructive pulmonary disease) (HCC)   OSA (obstructive sleep apnea)   Peripheral vascular disease (HCC)   Anticoagulated on Coumadin  Sepsis secondary to small bowel obstruction versus UTI, both present on admission Presented with abdominal pain, distention, leukocytosis with WBC 21,000, heart rate of 111 and tachypnea with respiratory rate of 29. CT abdomen and pelvis with contrast done on 11/03/2018 revealed findings consistent with early complete or partial small bowel obstruction with marked distention of the stomach and jejunum. Self reports prior abdominal surgeries from cholecystectomy and  hernia repair  Now positive UA with dysuria and polyuria Continue Rocephin daily, day #2 Sepsis criteria is resolving  Urine culture positive greater than 100,000 colonies of staph epidermidis Continue n.p.o. Continue to monitor output from NG tube suction Continue IV fluids, IV Zofran, pain management with IV fentanyl as needed 11/07/2018: No sepsis syndrome/physiology elicited.  Continue management for small bowel obstruction.  UTI, present on admission Management as stated above 11/07/2018: Urine culture grew staph epidermidis.  Will repeat urinalysis and urine culture.  Will obtain blood culture.  Will discontinue Rocephin.  Possible CKD 2:  11/07/2018: Noted prior documentation of AKI on CKD 3  Information visualized so far will suggest antibiotics possible chronic kidney disease stage II.   Continue to monitor.    Hypomagnesemia: Magnesium was 1.3 on presentation. Repleted. Continue to monitor.  Hypokalemia Potassium today is 3.9.  Recurrent DVT and PE. Patient is on chronic anticoagulation with warfarin. Coumadin is on hold INR 1.86 on 11/06/2018 Heparin drip until cleared from general surgery Pharmacy is managing heparin and Coumadin   Chronic distal aortic occlusion Diagnosed 5 years ago Seen again on CT abdomen and pelvis with contrast on admission  History of upper GI bleed Self-reported had an endoscopy in the past and treated for upper GI bleed Hemoglobin is stable No sign of overt bleeding  Peripheral vascular disease. Patient is on Pletal at home. Currently on hold as patient is n.p.o. Monitor.  Bilateral renal artery stenosis Diagnosed 5 years ago  History of COPD. Continue inhalers. Does not appear to be having any exacerbation right now.  Body mass index is 31 kg/m.  Obesity.  Chronic pain syndrome [facility] [place] [facility] [place] was reviewed. - Last prescription  for controlled substance was on December  2019 for Norco. -Continue here in the hospital once patient is able to take p.o.  Risk: High risk for decompensation due to ongoing small bowel obstruction, severe dyselectrolytemia, multiple comorbidities, and advanced age.  Nutrition: NPO DVT Prophylaxis: Heparin drip  Advance goals of care discussion: Full code   Consults: General surgery   Family Communication: no family was present at bedside, at the time of interview.  Disposition: To depend on hospital course.  Objective: Vitals:   11/06/18 0458 11/06/18 1441 11/06/18 2131 11/07/18 0500  BP: 131/76 (!) 164/94 (!) 151/75 (!) 162/73  Pulse: 97 (!) 106 94 99  Resp: 20 16 20    Temp: 98.4 F (36.9 C) 98.6 F (37 C) 98.5 F (36.9 C) 98.1 F (36.7 C)  TempSrc: Oral Oral Oral Oral  SpO2: 94%  94% 95%  Weight:      Height:        Intake/Output Summary (Last 24 hours) at 11/07/2018 1401 Last data filed at 11/07/2018 1350 Gross per 24 hour  Intake 911.35 ml  Output 1077 ml  Net -165.65 ml   Filed Weights   11/03/18 1350 11/03/18 2043  Weight: 106.6 kg 106.2 kg    Exam:  . General: Obese.  Not in any distress.  NG tube to low suction in situ.   . Cardiovascular: Regular rate and rhythm. [name]. Respiratory: Clear to Auscultation.   . Abdomen: Abdomen is obese, soft and nontender.  Hypoactive bowel sounds noted.    Data Reviewed: CBC: Recent Labs  Lab 11/03/18 1420  11/04/18 0315 11/04/18 1754 11/05/18 0342 11/06/18 0147 11/07/18 0503  WBC 21.2*  --  13.9* 12.4* 9.7 8.0 7.8  NEUTROABS 18.4*  --  11.7*  --   --   --   --   HGB 18.4*   < > 14.2 13.9 14.3 13.2 15.0  HCT 53.7*   < > 42.7 41.3 42.0 39.5 44.6  MCV 95.9  --  94.3 95.8 97.0 95.0 96.1  PLT 434*  --  246 256 230 204 190   < > = values in this interval not displayed.   Basic Metabolic Panel: Recent Labs  Lab 11/04/18 0315 11/04/18 1754 11/05/18 0342 11/06/18 0147 11/07/18 0503  NA 137 134* 139 139 138  K 3.5 3.4* 3.4* 3.3* 3.9  CL 108 105  106 104 106  CO2 17* 19* 22 22 19*  GLUCOSE 135* 112* 89 94 103*  BUN 32* 28* 23 16 12   CREATININE 1.75* 1.48* 1.28* 1.26* 1.14  CALCIUM 8.3* 8.0* 8.2* 8.3* 9.0  MG 1.3* 2.4  --   --   --    GFR: Estimated Creatinine Clearance: 73.8 mL/min (by C-G formula based on SCr of 1.14 mg/dL). Liver Function Tests: Recent Labs  Lab 11/03/18 1420 11/04/18 0315  AST 25 16  ALT 14 13  ALKPHOS 51 49  BILITOT 1.1 1.0  PROT 8.1 6.5  ALBUMIN 4.5 3.4*   Recent Labs  Lab 11/03/18 1420  LIPASE 36   No results for input(s): AMMONIA in the last 168 hours. Coagulation Profile: Recent Labs  Lab 11/04/18 0315 11/05/18 0342 11/05/18 1616 11/06/18 0147 11/07/18 0503  INR 2.95 2.30 1.94 1.86 1.56   Cardiac Enzymes: No results for input(s): CKTOTAL, CKMB, CKMBINDEX, TROPONINI in the last 168 hours. BNP (last 3 results) No results for input(s): PROBNP in the last 8760 hours. HbA1C: No results for input(s): HGBA1C in the last 72 hours. CBG: No results  for input(s): GLUCAP in the last 168 hours. Lipid Profile: No results for input(s): CHOL, HDL, LDLCALC, TRIG, CHOLHDL, LDLDIRECT in the last 72 hours. Thyroid Function Tests: No results for input(s): TSH, T4TOTAL, FREET4, T3FREE, THYROIDAB in the last 72 hours. Anemia Panel: No results for input(s): VITAMINB12, FOLATE, FERRITIN, TIBC, IRON, RETICCTPCT in the last 72 hours. Urine analysis:    Component Value Date/Time   COLORURINE YELLOW 11/05/2018 0927   APPEARANCEUR HAZY (A) 11/05/2018 0927   LABSPEC 1.017 11/05/2018 0927   PHURINE 6.0 11/05/2018 0927   GLUCOSEU NEGATIVE 11/05/2018 0927   HGBUR SMALL (A) 11/05/2018 0927   BILIRUBINUR NEGATIVE 11/05/2018 0927   KETONESUR NEGATIVE 11/05/2018 0927   PROTEINUR NEGATIVE 11/05/2018 0927   UROBILINOGEN 0.2 04/08/2015 2033   NITRITE POSITIVE (A) 11/05/2018 0927   LEUKOCYTESUR LARGE (A) 11/05/2018 0927   Sepsis Labs: @LABRCNTIP (procalcitonin:4,lacticidven:4)  ) Recent Results (from the  past 240 hour(s))  Culture, Urine     Status: Abnormal   Collection Time: 11/05/18  9:22 AM  Result Value Ref Range Status   Specimen Description URINE, CLEAN CATCH  Final   Special Requests   Final    Normal Performed at [facility], [address], [place], [place] [address]    Culture >=100,000 COLONIES/mL STAPHYLOCOCCUS EPIDERMIDIS (A)  Final   Report Status 11/07/2018 FINAL  Final   Organism ID, Bacteria STAPHYLOCOCCUS EPIDERMIDIS (A)  Final      Susceptibility   Staphylococcus epidermidis - MIC*    CIPROFLOXACIN >=8 RESISTANT Resistant     GENTAMICIN <=0.5 SENSITIVE Sensitive     NITROFURANTOIN <=16 SENSITIVE Sensitive     OXACILLIN <=0.25 SENSITIVE Sensitive     TETRACYCLINE 4 SENSITIVE Sensitive     VANCOMYCIN 1 SENSITIVE Sensitive     TRIMETH/SULFA <=10 SENSITIVE Sensitive     CLINDAMYCIN <=0.25 SENSITIVE Sensitive     RIFAMPIN <=0.5 SENSITIVE Sensitive     Inducible Clindamycin NEGATIVE Sensitive     * >=100,000 COLONIES/mL STAPHYLOCOCCUS EPIDERMIDIS      Studies: Dg Abd 2 Views  Result Date: 11/07/2018 CLINICAL DATA:  Abdominal pain and vomiting EXAM: ABDOMEN - 2 VIEW COMPARISON:  11/06/2018 FINDINGS: Scattered large and small bowel gas is noted. Contrast material is again noted within the colon. No free air is seen. No obstructive changes are noted. Degenerative changes of lumbar spine are seen. Nasogastric catheter is noted with the tip in the stomach although the proximal side port lies within the distal esophagus. This should be advanced several cm. IMPRESSION: Gastric catheter as described which should be advanced several cm into the stomach. No definitive obstructive changes are noted. Electronically Signed   By: Alcide Clever M.D.   On: 11/07/2018 12:35    Scheduled Meds: . sodium chloride flush  3 mL Intravenous Q12H  . sorbitol, milk of mag, mineral oil, glycerin (SMOG) enema  960 mL Rectal Once    Continuous Infusions: . cefTRIAXone (ROCEPHIN)  IV 1  g (11/07/18 1133)  . dextrose 5% lactated ringers with KCl 20 mEq/L 50 mL/hr at 11/07/18 0406  . heparin 2,100 Units/hr (11/07/18 1349)  . sodium chloride       LOS: 4 days     Barnetta Chapel, MD [facility] Pager [phone] If 7PM-7AM, please contact night-coverage [email or web address] Password [email or web address] 11/07/2018, 2:01 PM

## 2018-11-07 NOTE — Progress Notes (Signed)
Pt had a smog enema this afternoon with a small amount of mushy stools noted . Abdomen remains round and distended,noflatus reported

## 2018-11-07 NOTE — Progress Notes (Signed)
ANTICOAGULATION CONSULT NOTE   Pharmacy Consult for Heparin Indication: recurrent PE/DVTs, when INR <2  Allergies  Allergen Reactions  . Codeine Hives and Other (See Comments)    Takes benadryl to stop reaction    Patient Measurements: Height: 6\' 1"  (185.4 cm) Weight: 234 lb 2.1 oz (106.2 kg) IBW/kg (Calculated) : 79.9 Heparin Dosing Weight: 102 kg  Vital Signs: Temp: 97.5 F (36.4 C) (01/21 1533) Temp Source: Oral (01/21 1533) BP: 153/80 (01/21 1533) Pulse Rate: 103 (01/21 1533)  Labs: Recent Labs    11/05/18 0342 11/05/18 1616 11/06/18 0147  11/06/18 2210 11/07/18 0503 11/07/18 1702  HGB 14.3  --  13.2  --   --  15.0  --   HCT 42.0  --  39.5  --   --  44.6  --   PLT 230  --  204  --   --  190  --   LABPROT 25.0* 21.9* 21.2*  --   --  18.5*  --   INR 2.30 1.94 1.86  --   --  1.56  --   HEPARINUNFRC  --   --  <0.10*   < > 0.29* 0.33 0.32  CREATININE 1.28*  --  1.26*  --   --  1.14  --    < > = values in this interval not displayed.    Estimated Creatinine Clearance: 73.8 mL/min (by C-G formula based on SCr of 1.14 mg/dL).   Medical History: Past Medical History:  Diagnosis Date  . AAA (abdominal aortic aneurysm) (HCC)   . 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    [name] COPD (chronic obstructive pulmonary disease) (HCC)   . DJD (degenerative joint disease)   . Dyslipidemia   . Gout   . Malignant hypertension   . Peripheral vascular disease (HCC)   . Pneumonia 08/2013   pt. reports that he is having this surgery 11/25/2014- for the lung problem that began with pneumonia in 09/2014  . Shortness of breath   . Venous thrombosis    Recurrent     Assessment: 73 year old male on chronic Coumadin for recurrent DVT and PE now NPO due to SBO. Pharmacy consulted to start IV Heparin as bridge while NPO and for possible surgery.   1/21 PM update: heparin level low-end therapeutic 0.32 on 2100  units/hr  Goal of Therapy:  INR 2-3 Heparin level 0.3-0.7 units/ml Monitor platelets by anticoagulation protocol: Yes   Plan:  -Increase heparin slightly to 2150 units/hr -f/u AM labs  Bayard Hugger, PharmD, BCPS, BCPPS Clinical Pharmacist  Pager: [phone]

## 2018-11-07 NOTE — Care Management Important Message (Signed)
Important Message  Patient Details  Name: Tony Zhang MRN: [id number] Date of Birth: 05-07-1946   Medicare Important Message Given:  Yes    Dorena Bodo 11/07/2018, 3:38 PM

## 2018-11-07 NOTE — Progress Notes (Signed)
ANTICOAGULATION CONSULT NOTE   Pharmacy Consult for Heparin Indication: recurrent PE/DVTs, when INR <2  Allergies  Allergen Reactions  . Codeine Hives and Other (See Comments)    Takes benadryl to stop reaction    Patient Measurements: Height: 6\' 1"  (185.4 cm) Weight: 234 lb 2.1 oz (106.2 kg) IBW/kg (Calculated) : 79.9 Heparin Dosing Weight: 102 kg  Vital Signs: Temp: 98.1 F (36.7 C) (01/21 0500) Temp Source: Oral (01/21 0500) BP: 162/73 (01/21 0500) Pulse Rate: 99 (01/21 0500)  Labs: Recent Labs    11/04/18 1754  11/05/18 0342 11/05/18 1616  11/06/18 0147 11/06/18 1232 11/06/18 2210 11/07/18 0503  HGB 13.9  --  14.3  --   --  13.2  --   --   --   HCT 41.3  --  42.0  --   --  39.5  --   --   --   PLT 256  --  230  --   --  204  --   --   --   LABPROT  --    < > 25.0* 21.9*  --  21.2*  --   --  18.5*  INR  --    < > 2.30 1.94  --  1.86  --   --  1.56  HEPARINUNFRC  --   --   --   --    < > <0.10* 0.25* 0.29* 0.33  CREATININE 1.48*  --  1.28*  --   --  1.26*  --   --   --    < > = values in this interval not displayed.    Estimated Creatinine Clearance: 66.8 mL/min (A) (by C-G formula based on SCr of 1.26 mg/dL (H)).   Medical History: Past Medical History:  Diagnosis Date  . AAA (abdominal aortic aneurysm) (HCC)   . Blood dyscrasia    followed by Dr. [name], for clotting issue, has been on Coumadin for about 20 yrs.    [name]. COPD (chronic obstructive pulmonary disease) (HCC)   . DJD (degenerative joint disease)   . Dyslipidemia   . Gout   . Malignant hypertension   . Peripheral vascular disease (HCC)   . Pneumonia 08/2013   pt. reports that he is having this surgery 11/25/2014- for the lung problem that began with pneumonia in 09/2014  . Shortness of breath   . Venous thrombosis    Recurrent     Assessment: 73 year old male on chronic Coumadin for recurrent DVT and PE now NPO due to SBO. Pharmacy consulted to start IV Heparin as bridge while NPO.    Currently INR in therapeutic range at 2.30 but trending down. Continuing hold Heparin start until INR <2 - will recheck INR this PM to re-evaluate ability to start IV Heparin. Last dose Coumadin was 11/02/18. Home regimen was 7mg  daily.   1/21 AM update: heparin level therapeutic this AM  Goal of Therapy:  INR 2-3 Heparin level 0.3-0.7 units/ml Monitor platelets by anticoagulation protocol: Yes   Plan:  -Cont heparin at 2100 units/hr -Re-check heparin level at 1200   Abran DukeJames Keyaria Lawson, PharmD, BCPS Clinical Pharmacist Phone: [phone]

## 2018-11-07 NOTE — Progress Notes (Addendum)
[facility] Progress Note     Subjective: CC: abdominal pain Patient reports more pain and distention. Had some BMs yesterday, reports they were diarrhea and almost white. Not passing flatus. No appetite.   Objective: Vital signs in last 24 hours: Temp:  [98.1 F (36.7 C)-98.6 F (37 C)] 98.1 F (36.7 C) (01/21 0500) Pulse Rate:  [94-106] 99 (01/21 0500) Resp:  [16-20] 20 (01/20 2131) BP: (151-164)/(73-94) 162/73 (01/21 0500) SpO2:  [94 %-95 %] 95 % (01/21 0500) Last BM Date: 11/06/18  Intake/Output from previous day: 01/20 0701 - 01/21 0700 In: 377.4 [I.V.:277.4; IV Piggyback:100] Out: 902 [Urine:902] Intake/Output this shift: No intake/output data recorded.  PE: Gen:  Alert, NAD, pleasant Card:  Regular rate and rhythm Pulm:  Normal effort, diminished bilaterally Abd: soft, distended, TTP more so in lower abdomen, BS hypoactive  Lab Results:  Recent Labs    11/06/18 0147 11/07/18 0503  WBC 8.0 7.8  HGB 13.2 15.0  HCT 39.5 44.6  PLT 204 190   BMET Recent Labs    11/06/18 0147 11/07/18 0503  NA 139 138  K 3.3* 3.9  CL 104 106  CO2 22 19*  GLUCOSE 94 103*  BUN 16 12  CREATININE 1.26* 1.14  CALCIUM 8.3* 9.0   PT/INR Recent Labs    11/06/18 0147 11/07/18 0503  LABPROT 21.2* 18.5*  INR 1.86 1.56   CMP     Component Value Date/Time   NA 138 11/07/2018 0503   K 3.9 11/07/2018 0503   CL 106 11/07/2018 0503   CO2 19 (L) 11/07/2018 0503   GLUCOSE 103 (H) 11/07/2018 0503   BUN 12 11/07/2018 0503   CREATININE 1.14 11/07/2018 0503   CREATININE 1.56 (H) 09/17/2013 1135   CALCIUM 9.0 11/07/2018 0503   PROT 6.5 11/04/2018 0315   ALBUMIN 3.4 (L) 11/04/2018 0315   AST 16 11/04/2018 0315   ALT 13 11/04/2018 0315   ALKPHOS 49 11/04/2018 0315   BILITOT 1.0 11/04/2018 0315   GFRNONAA >60 11/07/2018 0503   GFRAA >60 11/07/2018 0503   Lipase     Component Value Date/Time   LIPASE 36 11/03/2018 1420       Studies/Results: Dg Abd 2  Views  Result Date: 11/06/2018 CLINICAL DATA:  Small-bowel obstruction.  Abdominal distension. EXAM: ABDOMEN - 2 VIEW COMPARISON:  11/05/2018 FINDINGS: Persistent moderate stool containing barium/contrast throughout the colon and down into the rectum. There are few air-filled loops of small bowel but no distension or air-fluid levels. No free air on the decubitus film. The soft tissue shadows of the abdomen are grossly maintained. Bony structures are intact. IMPRESSION: Contrast throughout the colon persists. No findings for persistent small bowel obstruction or free air. Electronically Signed   By: Rudie Meyer M.D.   On: 11/06/2018 12:25    Anti-infectives: Anti-infectives (From admission, onward)   Start     Dose/Rate Route Frequency Ordered Stop   11/05/18 1230  cefTRIAXone (ROCEPHIN) 1 g in sodium chloride 0.9 % 100 mL IVPB     1 g 200 mL/hr over 30 Minutes Intravenous Every 24 hours 11/05/18 1202         Assessment/Plan UTI - per medicine AKI on CKD stage III Recurrent DVT and PE - on heparin gtt, INR 1.5 Chronic distal aortic occlusion  Hx of UGI bleed PVD Bilateral renal artery stenosis COPD Chronic pain syndrome Obesity   SBO -SBO protocol completed and contrast noted in his colon on delayed films; however, he is  still tender on abdominal exam, NGT output appears more bilious  -check 2 view films again, Had some BMs but still very distended and nauseated - I am concerned that patient may end up requiring some sort of operative intervention but we will seen what films show today  FEN - NPO/NGT VTE - heparin gtt ID - rocephin  LOS: 4 days    Wells Guiles , [facility] 11/07/2018, 10:21 AM Pager: [phone] Consults: [phone] Mon-Fri 7:00 am-4:30 pm Sat-Sun 7:00 am-11:30 am

## 2018-11-08 ENCOUNTER — Inpatient Hospital Stay (HOSPITAL_COMMUNITY): Payer: Medicare Other

## 2018-11-08 LAB — URINALYSIS, ROUTINE W REFLEX MICROSCOPIC
Bacteria, UA: NONE SEEN
Bilirubin Urine: NEGATIVE
Glucose, UA: NEGATIVE mg/dL
Ketones, ur: NEGATIVE mg/dL
Leukocytes, UA: NEGATIVE
Nitrite: NEGATIVE
Protein, ur: 30 mg/dL — AB
Specific Gravity, Urine: 1.015 (ref 1.005–1.030)
pH: 7 (ref 5.0–8.0)

## 2018-11-08 LAB — HEPARIN LEVEL (UNFRACTIONATED)
HEPARIN UNFRACTIONATED: 0.18 [IU]/mL — AB (ref 0.30–0.70)
Heparin Unfractionated: 0.43 IU/mL (ref 0.30–0.70)

## 2018-11-08 LAB — PROTIME-INR
INR: 1.47
Prothrombin Time: 17.7 seconds — ABNORMAL HIGH (ref 11.4–15.2)

## 2018-11-08 MED ORDER — BISACODYL 10 MG RE SUPP
10.0000 mg | Freq: Once | RECTAL | Status: AC
  Administered 2018-11-08: 10 mg via RECTAL
  Filled 2018-11-08: qty 1

## 2018-11-08 NOTE — Progress Notes (Signed)
[facility] Progress Note     Subjective: CC-  Patient reports no change from yesterday. Continues to complain of mild diffuse abdominal pain and distension. He had a large BM with enemas yesterday, and 2 smaller subsequent BMs. Not passing flatus. States that he feels a little nauseated at rest, but less nausea during ambulation.  Objective: Vital signs in last 24 hours: Temp:  [97.5 F (36.4 C)-98.7 F (37.1 C)] 98.7 F (37.1 C) (01/22 0410) Pulse Rate:  [93-103] 93 (01/22 0410) Resp:  [16-18] 16 (01/22 0410) BP: (153-189)/(80-93) 153/92 (01/22 0410) SpO2:  [94 %-95 %] 95 % (01/22 0410) Last BM Date: 11/07/18  Intake/Output from previous day: 01/21 0701 - 01/22 0700 In: 534 [I.V.:434; IV Piggyback:100] Out: 950 [Urine:725; Emesis/NG output:225] Intake/Output this shift: No intake/output data recorded.  PE: Gen:  Alert, NAD, pleasant HEENT: EOM's intact, pupils equal and round Pulm:  effort normal Abd: Soft, protuberant, mild diffuse TTP without rebound or guarding, few BS heard Skin: warm and dry  Lab Results:  Recent Labs    11/06/18 0147 11/07/18 0503  WBC 8.0 7.8  HGB 13.2 15.0  HCT 39.5 44.6  PLT 204 190   BMET Recent Labs    11/06/18 0147 11/07/18 0503  NA 139 138  K 3.3* 3.9  CL 104 106  CO2 22 19*  GLUCOSE 94 103*  BUN 16 12  CREATININE 1.26* 1.14  CALCIUM 8.3* 9.0   PT/INR Recent Labs    11/07/18 0503 11/08/18 0259  LABPROT 18.5* 17.7*  INR 1.56 1.47   CMP     Component Value Date/Time   NA 138 11/07/2018 0503   K 3.9 11/07/2018 0503   CL 106 11/07/2018 0503   CO2 19 (L) 11/07/2018 0503   GLUCOSE 103 (H) 11/07/2018 0503   BUN 12 11/07/2018 0503   CREATININE 1.14 11/07/2018 0503   CREATININE 1.56 (H) 09/17/2013 1135   CALCIUM 9.0 11/07/2018 0503   PROT 6.5 11/04/2018 0315   ALBUMIN 3.4 (L) 11/04/2018 0315   AST 16 11/04/2018 0315   ALT 13 11/04/2018 0315   ALKPHOS 49 11/04/2018 0315   BILITOT 1.0 11/04/2018  0315   GFRNONAA >60 11/07/2018 0503   GFRAA >60 11/07/2018 0503   Lipase     Component Value Date/Time   LIPASE 36 11/03/2018 1420       Studies/Results: Dg Abd 2 Views  Result Date: 11/08/2018 CLINICAL DATA:  Abdominal pain EXAM: ABDOMEN - 2 VIEW COMPARISON:  11/07/2018 FINDINGS: Scattered contrast is again noted throughout the colon. No definitive obstructive changes are seen. No free air is noted. Gastric catheter again shows the proximal side port within the distal esophagus. This should be advanced several cm. No acute bony abnormality is noted. IMPRESSION: Gastric catheter which should be advanced several cm. No significant obstructive changes are noted. Electronically Signed   By: Alcide Clever M.D.   On: 11/08/2018 08:13   Dg Abd 2 Views  Result Date: 11/07/2018 CLINICAL DATA:  Abdominal pain and vomiting EXAM: ABDOMEN - 2 VIEW COMPARISON:  11/06/2018 FINDINGS: Scattered large and small bowel gas is noted. Contrast material is again noted within the colon. No free air is seen. No obstructive changes are noted. Degenerative changes of lumbar spine are seen. Nasogastric catheter is noted with the tip in the stomach although the proximal side port lies within the distal esophagus. This should be advanced several cm. IMPRESSION: Gastric catheter as described which should be advanced several cm into the stomach.  No definitive obstructive changes are noted. Electronically Signed   By: Alcide CleverMark  Lukens M.D.   On: 11/07/2018 12:35   Dg Abd 2 Views  Result Date: 11/06/2018 CLINICAL DATA:  Small-bowel obstruction.  Abdominal distension. EXAM: ABDOMEN - 2 VIEW COMPARISON:  11/05/2018 FINDINGS: Persistent moderate stool containing barium/contrast throughout the colon and down into the rectum. There are few air-filled loops of small bowel but no distension or air-fluid levels. No free air on the decubitus film. The soft tissue shadows of the abdomen are grossly maintained. Bony structures are intact.  IMPRESSION: Contrast throughout the colon persists. No findings for persistent small bowel obstruction or free air. Electronically Signed   By: Rudie MeyerP.  Gallerani M.D.   On: 11/06/2018 12:25    Anti-infectives: Anti-infectives (From admission, onward)   Start     Dose/Rate Route Frequency Ordered Stop   11/05/18 1230  cefTRIAXone (ROCEPHIN) 1 g in sodium chloride 0.9 % 100 mL IVPB  Status:  Discontinued     1 g 200 mL/hr over 30 Minutes Intravenous Every 24 hours 11/05/18 1202 11/07/18 1805       Assessment/Plan UTI - per medicine AKI on CKD stage III Recurrent DVT and PE - on heparin gtt, INR 1.47 Chronic distal aortic occlusion  Hx of UGI bleed PVD Bilateral renal artery stenosis COPD Chronic pain syndrome Obesity   SBO -SBO protocol completed and contrast noted in his colon on delayed films - Patient continues to complain of abdominal pain. He is having bowel movements, 1 with enema and 2 without, but he is not passing any flatus. Xray today does not show an obstructive pattern, less stool burden. Will discuss plan with MD; due to persistent pain and minimal xray findings could consider repeat CT scan?  FEN -NPO/NGT VTE -heparin gtt ID -rocephin 1/19>>1/21   LOS: 5 days    Franne FortsBrooke A Aahan Marques , [facility] 11/08/2018, 9:40 AM Pager: [phone] Mon-Thurs 7:00 am-4:30 pm Fri 7:00 am -11:30 AM Sat-Sun 7:00 am-11:30 am

## 2018-11-08 NOTE — Progress Notes (Signed)
ANTICOAGULATION CONSULT NOTE   Pharmacy Consult:  Heparin Indication:  Recurrent VTEs  Allergies  Allergen Reactions  . Codeine Hives and Other (See Comments)    Takes benadryl to stop reaction    Patient Measurements: Height: 6\' 1"  (185.4 cm) Weight: 234 lb 2.1 oz (106.2 kg) IBW/kg (Calculated) : 79.9 Heparin Dosing Weight: 102 kg  Vital Signs: Temp: 98.7 F (37.1 C) (01/22 0410) Temp Source: Oral (01/22 0410) BP: 153/92 (01/22 0410) Pulse Rate: 93 (01/22 0410)  Labs: Recent Labs    11/06/18 0147  11/07/18 0503 11/07/18 1702 11/08/18 0259 11/08/18 1136  HGB 13.2  --  15.0  --   --   --   HCT 39.5  --  44.6  --   --   --   PLT 204  --  190  --   --   --   LABPROT 21.2*  --  18.5*  --  17.7*  --   INR 1.86  --  1.56  --  1.47  --   HEPARINUNFRC <0.10*   < > 0.33 0.32 0.18* 0.43  CREATININE 1.26*  --  1.14  --   --   --    < > = values in this interval not displayed.    Estimated Creatinine Clearance: 73.8 mL/min (by C-G formula based on SCr of 1.14 mg/dL).   Assessment: 72 YOM on chronic Coumadin for recurrent DVT and PE.  Pharmacy consulted to dose IV heparin as bridge while NPO.  Heparin level is therapeutic; no bleeding reported.  Goal of Therapy:  INR 2-3 Heparin level 0.3-0.7 units/ml Monitor platelets by anticoagulation protocol: Yes   Plan:  Continue heparin gtt at 2400 units/hr  Daily heparin level and CBC  Joeanna Howdyshell D. Laney Potash, PharmD, BCPS, BCCCP 11/08/2018, 1:22 PM

## 2018-11-08 NOTE — Plan of Care (Signed)
  Problem: Clinical Measurements: Goal: Will remain free from infection Outcome: Progressing   Problem: Activity: Goal: Risk for activity intolerance will decrease Outcome: Progressing   Problem: Nutrition: Goal: Adequate nutrition will be maintained Outcome: Progressing   Problem: Coping: Goal: Level of anxiety will decrease Outcome: Progressing   Problem: Elimination: Goal: Will not experience complications related to bowel motility Outcome: Progressing Goal: Will not experience complications related to urinary retention Outcome: Progressing   Problem: Pain Managment: Goal: General experience of comfort will improve Outcome: Progressing   Problem: Safety: Goal: Ability to remain free from injury will improve Outcome: Progressing   Problem: Skin Integrity: Goal: Risk for impaired skin integrity will decrease Outcome: Progressing   

## 2018-11-08 NOTE — Progress Notes (Signed)
ANTICOAGULATION CONSULT NOTE   Pharmacy Consult for Heparin Indication: recurrent PE/DVTs, when INR <2  Allergies  Allergen Reactions  . Codeine Hives and Other (See Comments)    Takes benadryl to stop reaction    Patient Measurements: Height: 6\' 1"  (185.4 cm) Weight: 234 lb 2.1 oz (106.2 kg) IBW/kg (Calculated) : 79.9 Heparin Dosing Weight: 102 kg  Vital Signs: Temp: 98.4 F (36.9 C) (01/21 2054) Temp Source: Oral (01/21 2054) BP: 189/93 (01/21 2054) Pulse Rate: 101 (01/21 2054)  Labs: Recent Labs    11/06/18 0147  11/07/18 0503 11/07/18 1702 11/08/18 0259  HGB 13.2  --  15.0  --   --   HCT 39.5  --  44.6  --   --   PLT 204  --  190  --   --   LABPROT 21.2*  --  18.5*  --  17.7*  INR 1.86  --  1.56  --  1.47  HEPARINUNFRC <0.10*   < > 0.33 0.32 0.18*  CREATININE 1.26*  --  1.14  --   --    < > = values in this interval not displayed.    Estimated Creatinine Clearance: 73.8 mL/min (by C-G formula based on SCr of 1.14 mg/dL).   Assessment: 73 year old male on chronic Coumadin for recurrent DVT and PE now NPO due to SBO. Pharmacy consulted to start IV Heparin as bridge while NPO and for possible surgery. INR down to 1.47.  Heparin level subtherapeutic (0.18) on gtt at 2150 units/hr. No issues with line or bleeding reported per RN.  Goal of Therapy:  INR 2-3 Heparin level 0.3-0.7 units/ml Monitor platelets by anticoagulation protocol: Yes   Plan:  -Increase heparin to 2400 units/hr -F/u 8 hr heparin level   Christoper Fabian, PharmD, BCPS Clinical pharmacist  **Pharmacist phone directory can now be found on [email or web address] ([email or web address]).  Listed under [facility]. 11/08/2018 4:07 AM

## 2018-11-08 NOTE — Progress Notes (Signed)
PROGRESS NOTE  Tony Zhang RJJ:[id number] DOB: 1945-12-02 DOA: 11/03/2018 PCP: [name], [name], MD  HPI/Recap of past 24 hours: Tony Zhang is a 73 y.o. 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 Presents with complaints of 3-day history of nausea and vomiting.  Associated with significant abdominal pain and distention.  CT scan of the abdomen pelvis w contrast positive for small bowel obstruction.  General surgery consulted and following.   Hospital course complicated by dysuria which was present prior to admission.  Started on Rocephin empirically.  Urine culture is pending.   11/06/2018: Patient seen and examined at bedside.  No acute events overnight.  Reported abdominal pain is improving.  Has intermittent nausea with no vomiting.  NG tube in place with persistent output.  General surgery following.  11/07/2018: Patient seen.  NG to low suction in situ.  Surgical input is highly appreciated.  11/08/2018: Patient seen.  No new changes.  Patient tells me that he has some bowel movements after enema yesterday.  Still having significant drainage at the NG tube.  Surgical input is appreciated.  Assessment/Plan: Principal Problem:   Small bowel obstruction (HCC) Active Problems:   AAA (abdominal aortic aneurysm) (HCC)   Chronic distal aortic occlusion (HCC)   DVT, lower extremity and pulmonary embolism, recurrent   Hyperlipidemia   COPD (chronic obstructive pulmonary disease) (HCC)   OSA (obstructive sleep apnea)   Peripheral vascular disease (HCC)   Anticoagulated on Coumadin  Sepsis secondary to small bowel obstruction versus UTI, both present on admission Presented with abdominal pain, distention, leukocytosis with WBC 21,000, heart rate of 111 and tachypnea with respiratory rate of 29. CT abdomen and pelvis with contrast done on 11/03/2018  revealed findings consistent with early complete or partial small bowel obstruction with marked distention of the stomach and jejunum. Self reports prior abdominal surgeries from cholecystectomy and hernia repair  Now positive UA with dysuria and polyuria Continue Rocephin daily, day #2 Sepsis criteria is resolving  Urine culture positive greater than 100,000 colonies of staph epidermidis Continue n.p.o. Continue to monitor output from NG tube suction Continue IV fluids, IV Zofran, pain management with IV fentanyl as needed 11/07/2018: No sepsis syndrome/physiology elicited.  Continue management for small bowel obstruction.  UTI, present on admission Management as stated above 11/07/2018: Urine culture grew staph epidermidis.  Will repeat urinalysis and urine culture.  Will obtain blood culture.  Will discontinue Rocephin.  Possible CKD 2:  11/07/2018: Noted prior documentation of AKI on CKD 3  Information visualized so far will suggest antibiotics possible chronic kidney disease stage II.   Continue to monitor.    Hypomagnesemia: Magnesium was 1.3 on presentation. Repleted. Continue to monitor.  Hypokalemia Continue to monitor potassium. Renal panel in a.m.  Recurrent DVT and PE. Patient is on chronic anticoagulation with warfarin. Coumadin is on hold INR 1.86 on 11/06/2018 Heparin drip until cleared from general surgery Pharmacy is managing heparin and Coumadin   Chronic distal aortic occlusion Diagnosed 5 years ago Seen again on CT abdomen and pelvis with contrast on admission  History of upper GI bleed Self-reported had an endoscopy in the past and treated for upper GI bleed Hemoglobin is stable No sign of overt bleeding  Peripheral vascular disease. Patient is on Pletal at home. Currently on hold as patient is n.p.o. Monitor.  Bilateral renal artery stenosis Diagnosed 5 years ago  History of  COPD. Continue inhalers. Does not appear to be having any  exacerbation right now.  Body mass index is 31 kg/m.  Obesity.  Chronic pain syndrome - [facility] Controlled Substance Reporting System database was reviewed. - Last prescription for controlled substance was on December 2019 for Norco. -Continue here in the hospital once patient is able to take p.o.  Risk: High risk for decompensation due to ongoing small bowel obstruction, severe dyselectrolytemia, multiple comorbidities, and advanced age.  Nutrition: NPO DVT Prophylaxis: Heparin drip  Advance goals of care discussion: Full code   Consults: General surgery   Family Communication: no family was present at bedside, at the time of interview.  Disposition: To depend on hospital course.  Objective: Vitals:   11/08/18 0409 11/08/18 0410 11/08/18 1418 11/08/18 1420  BP:  (!) 153/92  (!) 167/80  Pulse: 98 93 (!) 102 99  Resp: 17 16  20   Temp:  98.7 F (37.1 C)  98.5 F (36.9 C)  TempSrc:  Oral  Oral  SpO2: 94% 95% 96% 95%  Weight:      Height:        Intake/Output Summary (Last 24 hours) at 11/08/2018 1540 Last data filed at 11/08/2018 1500 Gross per 24 hour  Intake 0 ml  Output 600 ml  Net -600 ml   Filed Weights   11/03/18 1350 11/03/18 2043  Weight: 106.6 kg 106.2 kg    Exam:  . General: Obese.  Not in any distress.  NG tube to low suction in situ.   . Cardiovascular: Regular rate and rhythm. [name] Respiratory: Clear to Auscultation.   . Abdomen: Abdomen is obese, soft and nontender.  Episodic bowel sounds right abdominal area.    Data Reviewed: CBC: Recent Labs  Lab 11/03/18 1420  11/04/18 0315 11/04/18 1754 11/05/18 0342 11/06/18 0147 11/07/18 0503  WBC 21.2*  --  13.9* 12.4* 9.7 8.0 7.8  NEUTROABS 18.4*  --  11.7*  --   --   --   --   HGB 18.4*   < > 14.2 13.9 14.3 13.2 15.0  HCT 53.7*   < > 42.7 41.3 42.0 39.5 44.6  MCV 95.9  --  94.3 95.8 97.0 95.0 96.1  PLT 434*  --  246 256 230 204 190   < > = values in this interval not displayed.    Basic Metabolic Panel: Recent Labs  Lab 11/04/18 0315 11/04/18 1754 11/05/18 0342 11/06/18 0147 11/07/18 0503  NA 137 134* 139 139 138  K 3.5 3.4* 3.4* 3.3* 3.9  CL 108 105 106 104 106  CO2 17* 19* 22 22 19*  GLUCOSE 135* 112* 89 94 103*  BUN 32* 28* 23 16 12   CREATININE 1.75* 1.48* 1.28* 1.26* 1.14  CALCIUM 8.3* 8.0* 8.2* 8.3* 9.0  MG 1.3* 2.4  --   --   --    GFR: Estimated Creatinine Clearance: 73.8 mL/min (by C-G formula based on SCr of 1.14 mg/dL). Liver Function Tests: Recent Labs  Lab 11/03/18 1420 11/04/18 0315  AST 25 16  ALT 14 13  ALKPHOS 51 49  BILITOT 1.1 1.0  PROT 8.1 6.5  ALBUMIN 4.5 3.4*   Recent Labs  Lab 11/03/18 1420  LIPASE 36   No results for input(s): AMMONIA in the last 168 hours. Coagulation Profile: Recent Labs  Lab 11/05/18 0342 11/05/18 1616 11/06/18 0147 11/07/18 0503 11/08/18 0259  INR 2.30 1.94 1.86 1.56 1.47   Cardiac Enzymes: No results for input(s): CKTOTAL, CKMB, CKMBINDEX,  TROPONINI in the last 168 hours. BNP (last 3 results) No results for input(s): PROBNP in the last 8760 hours. HbA1C: No results for input(s): HGBA1C in the last 72 hours. CBG: No results for input(s): GLUCAP in the last 168 hours. Lipid Profile: No results for input(s): CHOL, HDL, LDLCALC, TRIG, CHOLHDL, LDLDIRECT in the last 72 hours. Thyroid Function Tests: No results for input(s): TSH, T4TOTAL, FREET4, T3FREE, THYROIDAB in the last 72 hours. Anemia Panel: No results for input(s): VITAMINB12, FOLATE, FERRITIN, TIBC, IRON, RETICCTPCT in the last 72 hours. Urine analysis:    Component Value Date/Time   COLORURINE YELLOW 11/08/2018 0752   APPEARANCEUR CLEAR 11/08/2018 0752   LABSPEC 1.015 11/08/2018 0752   PHURINE 7.0 11/08/2018 0752   GLUCOSEU NEGATIVE 11/08/2018 0752   HGBUR SMALL (A) 11/08/2018 0752   BILIRUBINUR NEGATIVE 11/08/2018 0752   KETONESUR NEGATIVE 11/08/2018 0752   PROTEINUR 30 (A) 11/08/2018 0752   UROBILINOGEN 0.2  04/08/2015 2033   NITRITE NEGATIVE 11/08/2018 0752   LEUKOCYTESUR NEGATIVE 11/08/2018 0752   Sepsis Labs: @LABRCNTIP (procalcitonin:4,lacticidven:4)  ) Recent Results (from the past 240 hour(s))  Culture, Urine     Status: Abnormal   Collection Time: 11/05/18  9:22 AM  Result Value Ref Range Status   Specimen Description URINE, CLEAN CATCH  Final   Special Requests   Final    Normal Performed at [facility], [address] [place], [place] [address]    Culture >=100,000 COLONIES/mL STAPHYLOCOCCUS EPIDERMIDIS (A)  Final   Report Status 11/07/2018 FINAL  Final   Organism ID, Bacteria STAPHYLOCOCCUS EPIDERMIDIS (A)  Final      Susceptibility   Staphylococcus epidermidis - MIC*    CIPROFLOXACIN >=8 RESISTANT Resistant     GENTAMICIN <=0.5 SENSITIVE Sensitive     NITROFURANTOIN <=16 SENSITIVE Sensitive     OXACILLIN <=0.25 SENSITIVE Sensitive     TETRACYCLINE 4 SENSITIVE Sensitive     VANCOMYCIN 1 SENSITIVE Sensitive     TRIMETH/SULFA <=10 SENSITIVE Sensitive     CLINDAMYCIN <=0.25 SENSITIVE Sensitive     RIFAMPIN <=0.5 SENSITIVE Sensitive     Inducible Clindamycin NEGATIVE Sensitive     * >=100,000 COLONIES/mL STAPHYLOCOCCUS EPIDERMIDIS  Culture, blood (routine x 2)     Status: None (Preliminary result)   Collection Time: 11/07/18  6:36 PM  Result Value Ref Range Status   Specimen Description BLOOD RIGHT ANTECUBITAL  Final   Special Requests   Final    BOTTLES DRAWN AEROBIC AND ANAEROBIC Blood Culture results may not be optimal due to an excessive volume of blood received in culture bottles   Culture   Final    NO GROWTH < 24 HOURS Performed at [facility], [address] [place], [place] [address]    Report Status PENDING  Incomplete  Culture, blood (routine x 2)     Status: None (Preliminary result)   Collection Time: 11/07/18  6:36 PM  Result Value Ref Range Status   Specimen Description BLOOD LEFT HAND  Final   Special Requests   Final    BOTTLES DRAWN  AEROBIC AND ANAEROBIC Blood Culture results may not be optimal due to an excessive volume of blood received in culture bottles   Culture   Final    NO GROWTH < 24 HOURS Performed at [facility], [address] [place], [place] [address]    Report Status PENDING  Incomplete      Studies: Dg Abd 2 Views  Result Date: 11/08/2018  CLINICAL DATA:  Abdominal pain EXAM: ABDOMEN - 2 VIEW COMPARISON:  11/07/2018 FINDINGS: Scattered contrast is again noted throughout the colon. No definitive obstructive changes are seen. No free air is noted. Gastric catheter again shows the proximal side port within the distal esophagus. This should be advanced several cm. No acute bony abnormality is noted. IMPRESSION: Gastric catheter which should be advanced several cm. No significant obstructive changes are noted. Electronically Signed   By: Alcide Clever M.D.   On: 11/08/2018 08:13    Scheduled Meds: . sodium chloride flush  3 mL Intravenous Q12H    Continuous Infusions: . dextrose 5% lactated ringers with KCl 20 mEq/L 50 mL/hr at 11/08/18 0022  . heparin 2,400 Units/hr (11/08/18 1315)  . sodium chloride       LOS: 5 days     Barnetta Chapel, MD [facility] Pager [phone] If 7PM-7AM, please contact night-coverage [email or web address] Password [email or web address] 11/08/2018, 3:40 PM

## 2018-11-09 ENCOUNTER — Inpatient Hospital Stay (HOSPITAL_COMMUNITY): Payer: Medicare Other

## 2018-11-09 LAB — BASIC METABOLIC PANEL
Anion gap: 9 (ref 5–15)
BUN: 12 mg/dL (ref 8–23)
CO2: 22 mmol/L (ref 22–32)
Calcium: 9 mg/dL (ref 8.9–10.3)
Chloride: 109 mmol/L (ref 98–111)
Creatinine, Ser: 1.21 mg/dL (ref 0.61–1.24)
GFR calc Af Amer: >60 mL/min (ref >60)
GFR calc non Af Amer: 59 mL/min — ABNORMAL LOW (ref >60)
Glucose, Bld: 105 mg/dL — ABNORMAL HIGH (ref 70–99)
Potassium: 3.4 mmol/L — ABNORMAL LOW (ref 3.5–5.1)
Sodium: 140 mmol/L (ref 135–145)

## 2018-11-09 LAB — CBC
HCT: 42.1 % (ref 39.0–52.0)
Hemoglobin: 14 g/dL (ref 13.0–17.0)
MCH: 31.7 pg (ref 26.0–34.0)
MCHC: 33.3 g/dL (ref 30.0–36.0)
MCV: 95.5 fL (ref 80.0–100.0)
Platelets: 211 10*3/uL (ref 150–400)
RBC: 4.41 MIL/uL (ref 4.22–5.81)
RDW: 14.2 % (ref 11.5–15.5)
WBC: 6.4 10*3/uL (ref 4.0–10.5)
nRBC: 0 % (ref 0.0–0.2)

## 2018-11-09 LAB — URINALYSIS, ROUTINE W REFLEX MICROSCOPIC
Bacteria, UA: NONE SEEN
Bilirubin Urine: NEGATIVE
GLUCOSE, UA: NEGATIVE mg/dL
Ketones, ur: NEGATIVE mg/dL
Leukocytes, UA: NEGATIVE
Nitrite: NEGATIVE
Protein, ur: NEGATIVE mg/dL
Specific Gravity, Urine: 1.015 (ref 1.005–1.030)
pH: 6 (ref 5.0–8.0)

## 2018-11-09 LAB — PROTIME-INR
INR: 1.4
Prothrombin Time: 17 seconds — ABNORMAL HIGH (ref 11.4–15.2)

## 2018-11-09 LAB — URINE CULTURE: Culture: <10000 — AB

## 2018-11-09 LAB — HEPARIN LEVEL (UNFRACTIONATED): HEPARIN UNFRACTIONATED: 0.42 [IU]/mL (ref 0.30–0.70)

## 2018-11-09 MED ORDER — POTASSIUM CHLORIDE 10 MEQ/100ML IV SOLN
10.0000 meq | INTRAVENOUS | Status: AC
  Administered 2018-11-09 (×4): 10 meq via INTRAVENOUS
  Filled 2018-11-09 (×4): qty 100

## 2018-11-09 MED ORDER — ORAL CARE MOUTH RINSE
15.0000 mL | Freq: Two times a day (BID) | OROMUCOSAL | Status: DC
  Administered 2018-11-09 – 2018-11-14 (×7): 15 mL via OROMUCOSAL

## 2018-11-09 MED ORDER — BOOST / RESOURCE BREEZE PO LIQD CUSTOM
1.0000 | Freq: Three times a day (TID) | ORAL | Status: DC
  Administered 2018-11-10 (×2): 1 via ORAL

## 2018-11-09 MED ORDER — IOHEXOL 300 MG/ML  SOLN
100.0000 mL | Freq: Once | INTRAMUSCULAR | Status: AC | PRN
  Administered 2018-11-09: 100 mL via INTRAVENOUS

## 2018-11-09 NOTE — Progress Notes (Signed)
ANTICOAGULATION CONSULT NOTE   Pharmacy Consult:  Heparin Indication:  Recurrent VTEs  Allergies  Allergen Reactions  . Codeine Hives and Other (See Comments)    Takes benadryl to stop reaction    Patient Measurements: Height: 6\' 1"  (185.4 cm) Weight: 234 lb 2.1 oz (106.2 kg) IBW/kg (Calculated) : 79.9 Heparin Dosing Weight: 102 kg  Vital Signs: Temp: 97.5 F (36.4 C) (01/23 0527) Temp Source: Oral (01/23 0527) BP: 157/80 (01/23 0527) Pulse Rate: 94 (01/23 0527)  Labs: Recent Labs    11/07/18 0503  11/08/18 0259 11/08/18 1136 11/09/18 0229  HGB 15.0  --   --   --  14.0  HCT 44.6  --   --   --  42.1  PLT 190  --   --   --  211  LABPROT 18.5*  --  17.7*  --  17.0*  INR 1.56  --  1.47  --  1.40  HEPARINUNFRC 0.33   < > 0.18* 0.43 0.42  CREATININE 1.14  --   --   --  1.21   < > = values in this interval not displayed.    Estimated Creatinine Clearance: 69.5 mL/min (by C-G formula based on SCr of 1.21 mg/dL).   Assessment: Tony Zhang on chronic Coumadin for recurrent DVT and PE.  Pharmacy consulted to dose IV heparin as bridge while NPO.  Heparin level is therapeutic; no bleeding reported.  Goal of Therapy:  INR 2-3 Heparin level 0.3-0.7 units/ml Monitor platelets by anticoagulation protocol: Yes   Plan:  Continue heparin gtt at 2400 units/hr  Daily heparin level and CBC  Raphael Fitzpatrick D. Laney Potash, PharmD, BCPS, BCCCP 11/09/2018, 11:13 AM

## 2018-11-09 NOTE — Progress Notes (Signed)
Initial Nutrition Assessment  DOCUMENTATION CODES:   Obesity unspecified  INTERVENTION:   -Once diet is advanced, add Boost Breeze po TID, each supplement provides 250 kcal and 9 grams of protein  NUTRITION DIAGNOSIS:   Inadequate oral intake related to altered GI function as evidenced by NPO status.  GOAL:   Patient will meet greater than or equal to 90% of their needs  MONITOR:   PO intake, Supplement acceptance, Diet advancement, Labs, Weight trends, Skin, I & O's  REASON FOR ASSESSMENT:   NPO/Clear Liquid Diet    ASSESSMENT:   73 yo male with 2 days of vomiting. This started after eating hot dogs. He has been nauseated with abdominal pain since. Pain is diffuse and constant. His last BM was 2 days ago. This last flatus was today. He has not had a bowel obstruction before. Previous surgery includes appendectomy via paramedian incision and hernia repair via transverse middle incision.  Pt admitted with SBO.   1/17- NGT inserted  Spoke with pt at bedside, who reports good appetite PTA. Pt usually consumed 3 meals per day (Breakfast: eggs and sausage or pancakes; Lunch and dinner: hot dogs, hamburgers, Snacks: ice cream). Pt reports he consumed water, soda, and V8 juice as beverages. He was following his typical diet up until 1 day PTA, when he consumed a hot dog and scrambled eggs, which caused him to vomit (pt believes eating hot dogs caused this hospitalization and swore he will never eat them again).   Pt denies any weight loss PTA, however, suspects he has lost some weight during hospitalization secondary to NPO status. Pt understanding of NPO status. RD discussed potential for diet advancement or initiation of nutrition support pending medical course. Pt reports he has no desire to eat at this time.   Case discussed with RN, who reports plan for CT scan later on today.   ADDNEDUM: NGT has been removed and pt was placed on a clear liquid diet.   Labs reviewed: K: 3.4.    NUTRITION - FOCUSED PHYSICAL EXAM:    Most Recent Value  Orbital Region  No depletion  Upper Arm Region  No depletion  Thoracic and Lumbar Region  No depletion  Buccal Region  No depletion  Temple Region  No depletion  Clavicle Bone Region  No depletion  Clavicle and Acromion Bone Region  No depletion  Scapular Bone Region  No depletion  Dorsal Hand  No depletion  Patellar Region  No depletion  Anterior Thigh Region  No depletion  Posterior Calf Region  No depletion  Edema (RD Assessment)  None  Hair  Reviewed  Eyes  Reviewed  Mouth  Reviewed  Skin  Reviewed  Nails  Reviewed       Diet Order:   Diet Order            Diet clear liquid Room service appropriate? Yes; Fluid consistency: Thin  Diet effective now              EDUCATION NEEDS:   Education needs have been addressed  Skin:  Skin Assessment: Reviewed RN Assessment  Last BM:  11/08/18  Height:   Ht Readings from Last 1 Encounters:  11/03/18 6\' 1"  (1.854 m)    Weight:   Wt Readings from Last 1 Encounters:  11/03/18 106.2 kg    Ideal Body Weight:  83.6 kg  BMI:  Body mass index is 30.89 kg/m.  Estimated Nutritional Needs:   Kcal:  2000-2200  Protein:  110-125  grams  Fluid:  > 2.0 L    Coila Wardell A. Mayford KnifeWilliams, RD, LDN, CDE Pager: [phone] After hours Pager: [phone]

## 2018-11-09 NOTE — Progress Notes (Signed)
PROGRESS NOTE  Tony MinorsJerry F Hamre ZOX:[id number] DOB: 26-Nov-1945 DOA: 11/03/2018 PCP: [name], [name], MD  HPI/Recap of past 24 hours: Tony Zhang is a 73 y.o. 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 Presents with complaints of 3-day history of nausea and vomiting.  Associated with significant abdominal pain and distention.  CT scan of the abdomen pelvis w contrast positive for small bowel obstruction.  General surgery consulted and following.   Hospital course complicated by dysuria which was present prior to admission.  Started on Rocephin empirically.  Urine culture is pending.   11/06/2018: Patient seen and examined at bedside.  No acute events overnight.  Reported abdominal pain is improving.  Has intermittent nausea with no vomiting.  NG tube in place with persistent output.  General surgery following.  11/07/2018: Patient seen.  NG to low suction in situ.  Surgical input is highly appreciated.  11/08/2018: Patient seen.  No new changes.  Patient tells me that he has some bowel movements after enema yesterday.  Still having significant drainage at the NG tube.  Surgical input is appreciated.  11/09/2018: Still having significant output from the NG tube.  Significant for what sounds is appreciated.  Surgical input is highly appreciated.  CT scan result is also noted.  Assessment/Plan: Principal Problem:   Small bowel obstruction (HCC) Active Problems:   AAA (abdominal aortic aneurysm) (HCC)   Chronic distal aortic occlusion (HCC)   DVT, lower extremity and pulmonary embolism, recurrent   Hyperlipidemia   COPD (chronic obstructive pulmonary disease) (HCC)   OSA (obstructive sleep apnea)   Peripheral vascular disease (HCC)   Anticoagulated on Coumadin  Sepsis secondary to small bowel obstruction versus UTI, both present on admission Presented  with abdominal pain, distention, leukocytosis with WBC 21,000, heart rate of 111 and tachypnea with respiratory rate of 29. CT abdomen and pelvis with contrast done on 11/03/2018 revealed findings consistent with early complete or partial small bowel obstruction with marked distention of the stomach and jejunum. Self reports prior abdominal surgeries from cholecystectomy and hernia repair  Now positive UA with dysuria and polyuria Continue Rocephin daily, day #2 Sepsis criteria is resolving  Urine culture positive greater than 100,000 colonies of staph epidermidis Continue n.p.o. Continue to monitor output from NG tube suction Continue IV fluids, IV Zofran, pain management with IV fentanyl as needed 11/07/2018: No sepsis syndrome/physiology elicited.  Continue management for small bowel obstruction.  UTI, present on admission Management as stated above 11/07/2018: Urine culture grew staph epidermidis.  Will repeat urinalysis and urine culture.  Will obtain blood culture.  Will discontinue Rocephin.  Possible CKD 2:  11/07/2018: Noted prior documentation of AKI on CKD 3  Information visualized so far will suggest antibiotics possible chronic kidney disease stage II.   Continue to monitor.    Hypomagnesemia: Magnesium was 1.3 on presentation. Repleted. Continue to monitor. 11/09/2018: Magnesium as of 11/04/2018 was 2.4.  Continue to monitor.  Hypokalemia Continue to monitor potassium. Renal panel in a.m. 11/09/2018: Potassium today is 3.4.  Continue to monitor and replete.  Recurrent DVT and PE. Patient is on chronic anticoagulation with warfarin. Coumadin is on hold INR 1.86 on 11/06/2018 Heparin drip until cleared from general surgery Pharmacy is managing heparin and Coumadin   Chronic distal aortic occlusion Diagnosed 5 years ago Seen again on CT abdomen and pelvis with contrast on admission  History of upper GI  bleed Self-reported had an endoscopy in the past and treated for  upper GI bleed Hemoglobin is stable No sign of overt bleeding  Peripheral vascular disease. Patient is on Pletal at home. Currently on hold as patient is n.p.o. Monitor.  Bilateral renal artery stenosis Diagnosed 5 years ago  History of COPD. Continue inhalers. Does not appear to be having any exacerbation right now.  Body mass index is 31 kg/m.  Obesity.  Chronic pain syndrome - [facility] Controlled Substance Reporting System database was reviewed. - Last prescription for controlled substance was on December 2019 for Norco. -Continue here in the hospital once patient is able to take p.o.  Risk: High risk for decompensation due to ongoing small bowel obstruction, severe dyselectrolytemia, multiple comorbidities, and advanced age.  Nutrition: NPO DVT Prophylaxis: Heparin drip  Advance goals of care discussion: Full code   Consults: General surgery   Family Communication: no family was present at bedside, at the time of interview.  Disposition: To depend on hospital course.  Objective: Vitals:   11/09/18 0127 11/09/18 0527 11/09/18 1300 11/09/18 1535  BP: (!) 148/85 (!) 157/80 (!) 218/96 (!) 159/80  Pulse: 98 94 (!) 101   Resp:  18 18   Temp:  (!) 97.5 F (36.4 C) 98.4 F (36.9 C)   TempSrc:  Oral Oral   SpO2:  94% 97%   Weight:      Height:        Intake/Output Summary (Last 24 hours) at 11/09/2018 1641 Last data filed at 11/09/2018 1359 Gross per 24 hour  Intake 2675.21 ml  Output 1350 ml  Net 1325.21 ml   Filed Weights   11/03/18 1350 11/03/18 2043  Weight: 106.6 kg 106.2 kg    Exam:  . General: Obese.  Not in any distress.  NG tube to low suction in situ.   . Cardiovascular: Regular rate and rhythm. [name] Respiratory: Clear to Auscultation.   . Abdomen: Abdomen is obese, soft and nontender.  Episodic bowel sounds right abdominal area.    Data Reviewed: CBC: Recent Labs  Lab 11/03/18 1420  11/04/18 0315 11/04/18 1754 11/05/18 0342  11/06/18 0147 11/07/18 0503 11/09/18 0229  WBC 21.2*  --  13.9* 12.4* 9.7 8.0 7.8 6.4  NEUTROABS 18.4*  --  11.7*  --   --   --   --   --   HGB 18.4*   < > 14.2 13.9 14.3 13.2 15.0 14.0  HCT 53.7*   < > 42.7 41.3 42.0 39.5 44.6 42.1  MCV 95.9  --  94.3 95.8 97.0 95.0 96.1 95.5  PLT 434*  --  246 256 230 204 190 211   < > = values in this interval not displayed.   Basic Metabolic Panel: Recent Labs  Lab 11/04/18 0315 11/04/18 1754 11/05/18 0342 11/06/18 0147 11/07/18 0503 11/09/18 0229  NA 137 134* 139 139 138 140  K 3.5 3.4* 3.4* 3.3* 3.9 3.4*  CL 108 105 106 104 106 109  CO2 17* 19* 22 22 19* 22  GLUCOSE 135* 112* 89 94 103* 105*  BUN 32* 28* 23 16 12 12   CREATININE 1.75* 1.48* 1.28* 1.26* 1.14 1.21  CALCIUM 8.3* 8.0* 8.2* 8.3* 9.0 9.0  MG 1.3* 2.4  --   --   --   --    GFR: Estimated Creatinine Clearance: 69.5 mL/min (by C-G formula based on SCr of 1.21 mg/dL). Liver Function Tests: Recent Labs  Lab 11/03/18 1420 11/04/18 0315  AST 25  16  ALT 14 13  ALKPHOS 51 49  BILITOT 1.1 1.0  PROT 8.1 6.5  ALBUMIN 4.5 3.4*   Recent Labs  Lab 11/03/18 1420  LIPASE 36   No results for input(s): AMMONIA in the last 168 hours. Coagulation Profile: Recent Labs  Lab 11/05/18 1616 11/06/18 0147 11/07/18 0503 11/08/18 0259 11/09/18 0229  INR 1.94 1.86 1.56 1.47 1.40   Cardiac Enzymes: No results for input(s): CKTOTAL, CKMB, CKMBINDEX, TROPONINI in the last 168 hours. BNP (last 3 results) No results for input(s): PROBNP in the last 8760 hours. HbA1C: No results for input(s): HGBA1C in the last 72 hours. CBG: No results for input(s): GLUCAP in the last 168 hours. Lipid Profile: No results for input(s): CHOL, HDL, LDLCALC, TRIG, CHOLHDL, LDLDIRECT in the last 72 hours. Thyroid Function Tests: No results for input(s): TSH, T4TOTAL, FREET4, T3FREE, THYROIDAB in the last 72 hours. Anemia Panel: No results for input(s): VITAMINB12, FOLATE, FERRITIN, TIBC, IRON,  RETICCTPCT in the last 72 hours. Urine analysis:    Component Value Date/Time   COLORURINE AMBER (A) 11/09/2018 1250   APPEARANCEUR CLEAR 11/09/2018 1250   LABSPEC 1.015 11/09/2018 1250   PHURINE 6.0 11/09/2018 1250   GLUCOSEU NEGATIVE 11/09/2018 1250   HGBUR SMALL (A) 11/09/2018 1250   BILIRUBINUR NEGATIVE 11/09/2018 1250   KETONESUR NEGATIVE 11/09/2018 1250   PROTEINUR NEGATIVE 11/09/2018 1250   UROBILINOGEN 0.2 04/08/2015 2033   NITRITE NEGATIVE 11/09/2018 1250   LEUKOCYTESUR NEGATIVE 11/09/2018 1250   Sepsis Labs: @LABRCNTIP (procalcitonin:4,lacticidven:4)  ) Recent Results (from the past 240 hour(s))  Culture, Urine     Status: Abnormal   Collection Time: 11/05/18  9:22 AM  Result Value Ref Range Status   Specimen Description URINE, CLEAN CATCH  Final   Special Requests   Final    Normal Performed at [facility], [address] [place], [place] [address]    Culture >=100,000 COLONIES/mL STAPHYLOCOCCUS EPIDERMIDIS (A)  Final   Report Status 11/07/2018 FINAL  Final   Organism ID, Bacteria STAPHYLOCOCCUS EPIDERMIDIS (A)  Final      Susceptibility   Staphylococcus epidermidis - MIC*    CIPROFLOXACIN >=8 RESISTANT Resistant     GENTAMICIN <=0.5 SENSITIVE Sensitive     NITROFURANTOIN <=16 SENSITIVE Sensitive     OXACILLIN <=0.25 SENSITIVE Sensitive     TETRACYCLINE 4 SENSITIVE Sensitive     VANCOMYCIN 1 SENSITIVE Sensitive     TRIMETH/SULFA <=10 SENSITIVE Sensitive     CLINDAMYCIN <=0.25 SENSITIVE Sensitive     RIFAMPIN <=0.5 SENSITIVE Sensitive     Inducible Clindamycin NEGATIVE Sensitive     * >=100,000 COLONIES/mL STAPHYLOCOCCUS EPIDERMIDIS  Culture, Urine     Status: Abnormal   Collection Time: 11/07/18  6:05 PM  Result Value Ref Range Status   Specimen Description URINE, CLEAN CATCH  Final   Special Requests NONE  Final   Culture (A)  Final    <10,000 COLONIES/mL INSIGNIFICANT GROWTH Performed at [facility], [address], [place], [place]  [address]    Report Status 11/09/2018 FINAL  Final  Culture, blood (routine x 2)     Status: None (Preliminary result)   Collection Time: 11/07/18  6:36 PM  Result Value Ref Range Status   Specimen Description BLOOD RIGHT ANTECUBITAL  Final   Special Requests   Final    BOTTLES DRAWN AEROBIC AND ANAEROBIC Blood Culture results may not be optimal due to an excessive volume of blood received in culture bottles  Culture   Final    NO GROWTH 2 DAYS Performed at [facility], [address], [place], [place] [address]    Report Status PENDING  Incomplete  Culture, blood (routine x 2)     Status: None (Preliminary result)   Collection Time: 11/07/18  6:36 PM  Result Value Ref Range Status   Specimen Description BLOOD LEFT HAND  Final   Special Requests   Final    BOTTLES DRAWN AEROBIC AND ANAEROBIC Blood Culture results may not be optimal due to an excessive volume of blood received in culture bottles   Culture   Final    NO GROWTH 2 DAYS Performed at [facility], [address] [place], [place] [address]    Report Status PENDING  Incomplete      Studies: Ct Abdomen Pelvis W Contrast  Result Date: 11/09/2018 CLINICAL DATA:  Abdominal pain and distention. EXAM: CT ABDOMEN AND PELVIS WITH CONTRAST TECHNIQUE: Multidetector CT imaging of the abdomen and pelvis was performed using the standard protocol following bolus administration of intravenous contrast. CONTRAST:  OMNIPAQUE IOHEXOL 300 MG/ML  SOLN COMPARISON:  11/03/2018. 03/05/2018. FINDINGS: Lower chest: The heart size is normal. No substantial pericardial effusion. Coronary artery calcification is evident. 14 mm lymph node identified inferior right hilum but incompletely visualized. This is similar in appearance to the chest CT of 03/05/2018. Hepatobiliary: 1.8 cm low-density lesion medial right liver stable since prior study and also comparing back to 03/05/2018 when I remeasure it on that exam. There is no evidence for  gallstones, gallbladder wall thickening, or pericholecystic fluid. No intrahepatic or extrahepatic biliary dilation. Pancreas: No focal mass lesion. No dilatation of the main duct. No intraparenchymal cyst. No peripancreatic edema. Spleen: No splenomegaly. No focal mass lesion. Adrenals/Urinary Tract: No adrenal nodule or mass. Multiple cortical and central sinus cysts are identified in the kidneys bilaterally. Several low-density lesions in each kidney are too small to characterize. 2.2 cm lesion lower pole right kidney (21/8) has attenuation higher than would be expected for a simple cyst and is new in the interval since 03/05/2018. No evidence for hydroureter. The urinary bladder appears normal for the degree of distention. Stomach/Bowel: Stomach is nondistended. No gastric wall thickening. No evidence of outlet obstruction. Duodenum is normally positioned as is the ligament of Treitz. As on prior studies, multiple small fat density polypoid lesions in the transverse duodenum are noted compatible with lipomas. No small bowel wall thickening. No small bowel dilatation. The terminal ileum is normal. The appendix is not visualized, but there is no edema or inflammation in the region of the cecum. No gross colonic mass. No colonic wall thickening. 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 There is abdominal aortic atherosclerosis without aneurysm. Persistent left-sided IVC noted. There is no gastrohepatic or hepatoduodenal ligament lymphadenopathy. No intraperitoneal or retroperitoneal lymphadenopathy. No pelvic sidewall lymphadenopathy. Reproductive: The prostate gland and seminal vesicles are unremarkable. Other: No intraperitoneal free fluid. Musculoskeletal: Tiny right and small left groin hernias contain only fat. No worrisome lytic or sclerotic osseous abnormality. IMPRESSION: 1. No acute findings in the abdomen or pelvis. Specifically, no findings to explain the patient's history of abdominal  pain and distension. Small bowel loops are nondilated. Oral contrast material has migrated through to the level of the transverse colon. 2. 2.2 cm lesion lower pole right kidney is new since 03/05/2018 and has attenuation higher than would be expected for a simple cyst. This may be a cyst complicated by proteinaceous debris  or hemorrhage, but neoplasm could have this appearance. Nonemergent outpatient follow-up MRI of the abdomen without and with contrast recommended to further evaluate. 3. 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 Several low-density lesions in each kidney are too small to characterize. These could also be further assessed at the time of MRI. 4. 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 5. Persistent left-sided IVC. Electronically Signed   By: Kennith CenterEric  Mansell M.D.   On: 11/09/2018 15:53    Scheduled Meds: . mouth rinse  15 mL Mouth Rinse BID  . sodium chloride flush  3 mL Intravenous Q12H    Continuous Infusions: . dextrose 5% lactated ringers with KCl 20 mEq/L 50 mL/hr at 11/08/18 1827  . heparin 2,400 Units/hr (11/09/18 1129)  . sodium chloride       LOS: 6 days     Barnetta ChapelSylvester I Ellayna Hilligoss, MD [facility] Pager [phone] If 7PM-7AM, please contact night-coverage [email or web address] Password [email or web address] 11/09/2018, 4:41 PM

## 2018-11-09 NOTE — Plan of Care (Signed)
  Problem: Clinical Measurements: Goal: Will remain free from infection Outcome: Progressing Goal: Cardiovascular complication will be avoided Outcome: Progressing   Problem: Activity: Goal: Risk for activity intolerance will decrease Outcome: Progressing   Problem: Nutrition: Goal: Adequate nutrition will be maintained Outcome: Progressing   Problem: Coping: Goal: Level of anxiety will decrease Outcome: Progressing   Problem: Elimination: Goal: Will not experience complications related to bowel motility Outcome: Progressing Goal: Will not experience complications related to urinary retention Outcome: Progressing   Problem: Pain Managment: Goal: General experience of comfort will improve Outcome: Progressing   Problem: Safety: Goal: Ability to remain free from injury will improve Outcome: Progressing   Problem: Skin Integrity: Goal: Risk for impaired skin integrity will decrease Outcome: Progressing   

## 2018-11-09 NOTE — Progress Notes (Signed)
[facility] Progress Note     Subjective: CC: dysuria, abdominal distention Patient reports when he walks he feels bloated and tight. Did have another small BM last night, loose. Abdomen feels a little improved but not much. Still reporting dysuria and dark urine.   Objective: Vital signs in last 24 hours: Temp:  [97.5 F (36.4 C)-98.6 F (37 C)] 97.5 F (36.4 C) (01/23 0527) Pulse Rate:  [94-102] 94 (01/23 0527) Resp:  [18-20] 18 (01/23 0527) BP: (148-172)/(80-88) 157/80 (01/23 0527) SpO2:  [94 %-96 %] 94 % (01/23 0527) Last BM Date: 11/08/18  Intake/Output from previous day: 01/22 0701 - 01/23 0700 In: 2781.4 [P.O.:720; I.V.:2061.4] Out: 1425 [Urine:775; Emesis/NG output:650] Intake/Output this shift: Total I/O In: 173.8 [I.V.:173.8] Out: 200 [Urine:200]  PE: Gen:  Alert, NAD, pleasant Card:  Regular rate and rhythm Pulm:  Normal effort, clear to auscultation bilaterally Abd: Soft, mildly TTP in lower abdomen, mildly distended, bowel sounds hypoactive Skin: warm and dry, no rashes  Psych: A&Ox3   Lab Results:  Recent Labs    11/07/18 0503 11/09/18 0229  WBC 7.8 6.4  HGB 15.0 14.0  HCT 44.6 42.1  PLT 190 211   BMET Recent Labs    11/07/18 0503 11/09/18 0229  NA 138 140  K 3.9 3.4*  CL 106 109  CO2 19* 22  GLUCOSE 103* 105*  BUN 12 12  CREATININE 1.14 1.21  CALCIUM 9.0 9.0   PT/INR Recent Labs    11/08/18 0259 11/09/18 0229  LABPROT 17.7* 17.0*  INR 1.47 1.40   CMP     Component Value Date/Time   NA 140 11/09/2018 0229   K 3.4 (L) 11/09/2018 0229   CL 109 11/09/2018 0229   CO2 22 11/09/2018 0229   GLUCOSE 105 (H) 11/09/2018 0229   BUN 12 11/09/2018 0229   CREATININE 1.21 11/09/2018 0229   CREATININE 1.56 (H) 09/17/2013 1135   CALCIUM 9.0 11/09/2018 0229   PROT 6.5 11/04/2018 0315   ALBUMIN 3.4 (L) 11/04/2018 0315   AST 16 11/04/2018 0315   ALT 13 11/04/2018 0315   ALKPHOS 49 11/04/2018 0315   BILITOT 1.0 11/04/2018  0315   GFRNONAA 59 (L) 11/09/2018 0229   GFRAA >60 11/09/2018 0229   Lipase     Component Value Date/Time   LIPASE 36 11/03/2018 1420       Studies/Results: Dg Abd 2 Views  Result Date: 11/08/2018 CLINICAL DATA:  Abdominal pain EXAM: ABDOMEN - 2 VIEW COMPARISON:  11/07/2018 FINDINGS: Scattered contrast is again noted throughout the colon. No definitive obstructive changes are seen. No free air is noted. Gastric catheter again shows the proximal side port within the distal esophagus. This should be advanced several cm. No acute bony abnormality is noted. IMPRESSION: Gastric catheter which should be advanced several cm. No significant obstructive changes are noted. Electronically Signed   By: Alcide Clever M.D.   On: 11/08/2018 08:13   Dg Abd 2 Views  Result Date: 11/07/2018 CLINICAL DATA:  Abdominal pain and vomiting EXAM: ABDOMEN - 2 VIEW COMPARISON:  11/06/2018 FINDINGS: Scattered large and small bowel gas is noted. Contrast material is again noted within the colon. No free air is seen. No obstructive changes are noted. Degenerative changes of lumbar spine are seen. Nasogastric catheter is noted with the tip in the stomach although the proximal side port lies within the distal esophagus. This should be advanced several cm. IMPRESSION: Gastric catheter as described which should be advanced several cm into the  stomach. No definitive obstructive changes are noted. Electronically Signed   By: Alcide Clever M.D.   On: 11/07/2018 12:35    Anti-infectives: Anti-infectives (From admission, onward)   Start     Dose/Rate Route Frequency Ordered Stop   11/05/18 1230  cefTRIAXone (ROCEPHIN) 1 g in sodium chloride 0.9 % 100 mL IVPB  Status:  Discontinued     1 g 200 mL/hr over 30 Minutes Intravenous Every 24 hours 11/05/18 1202 11/07/18 1805       Assessment/Plan UTI - per medicine, still complaining of dysuria, resend urine culture AKI on CKD stage III - Cr 1.21 Recurrent DVT and PE - on  heparin gtt, INR1.40 Chronic distal aortic occlusion  Hx of UGI bleed PVD Bilateral renal artery stenosis COPD Chronic pain syndrome Obesity  SBO -SBO protocol completed and contrast noted in his colon on delayed films - Patient continues to complain of abdominal pain. X-ray continues to show non-obstructive pattern - NGT with 650 bilious output in 24h - repeat CT abd/pelvis today   FEN -NPO/NGT VTE -heparin gtt ID -rocephin 1/19>>1/21  LOS: 6 days    Wells Guiles , [facility] 11/09/2018, 9:12 AM Pager: [phone] Consults: [phone] Mon-Fri 7:00 am-4:30 pm Sat-Sun 7:00 am-11:30 am

## 2018-11-09 NOTE — Plan of Care (Signed)
  Problem: Clinical Measurements: Goal: Will remain free from infection Outcome: Progressing Note:  Pt has remained free from infection during my care.    Problem: Activity: Goal: Risk for activity intolerance will decrease Outcome: Progressing Note:  Pt has been able to ambulate in the hall during my care.    Problem: Pain Managment: Goal: General experience of comfort will improve Outcome: Progressing Note:  Pt has received two doses of PRN pain medication during my care.

## 2018-11-10 ENCOUNTER — Inpatient Hospital Stay (HOSPITAL_COMMUNITY): Payer: Medicare Other

## 2018-11-10 LAB — CBC
HCT: 39.7 % (ref 39.0–52.0)
Hemoglobin: 13 g/dL (ref 13.0–17.0)
MCH: 31.3 pg (ref 26.0–34.0)
MCHC: 32.7 g/dL (ref 30.0–36.0)
MCV: 95.7 fL (ref 80.0–100.0)
Platelets: 189 10*3/uL (ref 150–400)
RBC: 4.15 MIL/uL — ABNORMAL LOW (ref 4.22–5.81)
RDW: 14.2 % (ref 11.5–15.5)
WBC: 6.6 10*3/uL (ref 4.0–10.5)
nRBC: 0 % (ref 0.0–0.2)

## 2018-11-10 LAB — MAGNESIUM: Magnesium: 1.3 mg/dL — ABNORMAL LOW (ref 1.7–2.4)

## 2018-11-10 LAB — PROTIME-INR
INR: 1.28
Prothrombin Time: 15.9 seconds — ABNORMAL HIGH (ref 11.4–15.2)

## 2018-11-10 LAB — RENAL FUNCTION PANEL
Albumin: 3 g/dL — ABNORMAL LOW (ref 3.5–5.0)
Anion gap: 8 (ref 5–15)
BUN: 10 mg/dL (ref 8–23)
CO2: 22 mmol/L (ref 22–32)
Calcium: 8.8 mg/dL — ABNORMAL LOW (ref 8.9–10.3)
Chloride: 107 mmol/L (ref 98–111)
Creatinine, Ser: 1.09 mg/dL (ref 0.61–1.24)
GFR calc Af Amer: >60 mL/min (ref >60)
GFR calc non Af Amer: >60 mL/min (ref >60)
Glucose, Bld: 97 mg/dL (ref 70–99)
Phosphorus: 3.5 mg/dL (ref 2.5–4.6)
Potassium: 3.6 mmol/L (ref 3.5–5.1)
Sodium: 137 mmol/L (ref 135–145)

## 2018-11-10 LAB — HEPARIN LEVEL (UNFRACTIONATED): HEPARIN UNFRACTIONATED: 0.38 [IU]/mL (ref 0.30–0.70)

## 2018-11-10 MED ORDER — ADULT MULTIVITAMIN W/MINERALS CH
1.0000 | ORAL_TABLET | Freq: Every day | ORAL | Status: DC
  Administered 2018-11-10 – 2018-11-14 (×5): 1 via ORAL
  Filled 2018-11-10 (×5): qty 1

## 2018-11-10 MED ORDER — MAGNESIUM SULFATE 50 % IJ SOLN
3.0000 g | Freq: Once | INTRAVENOUS | Status: AC
  Administered 2018-11-10: 3 g via INTRAVENOUS
  Filled 2018-11-10: qty 6

## 2018-11-10 MED ORDER — POTASSIUM CHLORIDE 10 MEQ/100ML IV SOLN: 10.0000 meq | INTRAVENOUS | Status: DC

## 2018-11-10 MED ORDER — POTASSIUM CHLORIDE CRYS ER 20 MEQ PO TBCR
40.0000 meq | EXTENDED_RELEASE_TABLET | Freq: Once | ORAL | Status: AC
  Administered 2018-11-10: 40 meq via ORAL
  Filled 2018-11-10: qty 2

## 2018-11-10 MED ORDER — POLYETHYLENE GLYCOL 3350 17 G PO PACK
17.0000 g | PACK | Freq: Every day | ORAL | Status: DC
  Administered 2018-11-10 – 2018-11-14 (×5): 17 g via ORAL
  Filled 2018-11-10 (×5): qty 1

## 2018-11-10 MED ORDER — WARFARIN - PHARMACIST DOSING INPATIENT
Freq: Every day | Status: DC
  Administered 2018-11-10: 19:00:00

## 2018-11-10 MED ORDER — DOCUSATE SODIUM 100 MG PO CAPS
100.0000 mg | ORAL_CAPSULE | Freq: Two times a day (BID) | ORAL | Status: DC
  Administered 2018-11-10 – 2018-11-14 (×9): 100 mg via ORAL
  Filled 2018-11-10 (×9): qty 1

## 2018-11-10 MED ORDER — WARFARIN SODIUM 3 MG PO TABS
3.0000 mg | ORAL_TABLET | Freq: Once | ORAL | Status: AC
  Administered 2018-11-10: 3 mg via ORAL
  Filled 2018-11-10: qty 1

## 2018-11-10 NOTE — Progress Notes (Addendum)
ANTICOAGULATION CONSULT NOTE   Pharmacy Consult:  Heparin Indication:  Recurrent VTEs  Patient Measurements: Height: 6\' 1"  (185.4 cm) Weight: 234 lb 2.1 oz (106.2 kg) IBW/kg (Calculated) : 79.9 Heparin Dosing Weight: 102 kg  Vital Signs: Temp: 98.3 F (36.8 C) (01/24 0538) Temp Source: Oral (01/24 0538) BP: 105/51 (01/24 0538) Pulse Rate: 93 (01/24 0538)  Labs: Recent Labs    11/08/18 0259 11/08/18 1136 11/09/18 0229 11/10/18 0407  HGB  --   --  14.0 13.0  HCT  --   --  42.1 39.7  PLT  --   --  211 189  LABPROT 17.7*  --  17.0* 15.9*  INR 1.47  --  1.40 1.28  HEPARINUNFRC 0.18* 0.43 0.42 0.38  CREATININE  --   --  1.21 1.09     Assessment: 73 year old male on warfarin prior to admission for recurrent DVT and PE.  Pharmacy consulted to dose IV heparin as bridge while patient is NPO.  Heparin level is therapeutic; no bleeding reported.  Goal of Therapy:  INR 2-3 Heparin level 0.3-0.7 units/ml Monitor platelets by anticoagulation protocol: Yes   Plan:  Continue heparin gtt at 2400 units/hr  Daily heparin level and CBC F/u ability to resume warfarin   Baldemar Friday 11/10/2018 8:37 AM   Addendum -Consulted to transition back to warfarin -Warfarin 3 mg po x1 -Will continue heparin until INR > 2 -PTA warfarin dose: 2 mg po daily  Baldemar Friday 11/10/2018 12:19 PM

## 2018-11-10 NOTE — Progress Notes (Signed)
Nutrition Follow-up  DOCUMENTATION CODES:   Obesity unspecified  INTERVENTION:   -Continue Boost Breeze po TID, each supplement provides 250 kcal and 9 grams of protein -MVI with minerals daily -RD will continue to follow for diet advancement and adjust supplement regimen as appropriate  NUTRITION DIAGNOSIS:   Inadequate oral intake related to altered GI function as evidenced by per patient/family report.  Ongoing  GOAL:   Patient will meet greater than or equal to 90% of their needs  Progressing  MONITOR:   PO intake, Supplement acceptance, Diet advancement, Labs, Weight trends, Skin, I & O's  REASON FOR ASSESSMENT:   NPO/Clear Liquid Diet    ASSESSMENT:   73 yo male with 2 days of vomiting. This started after eating hot dogs. He has been nauseated with abdominal pain since. Pain is diffuse and constant. His last BM was 2 days ago. This last flatus was today. He has not had a bowel obstruction before. Previous surgery includes appendectomy via paramedian incision and hernia repair via transverse middle incision.  1/17- NGT inserted 1/23- NGT d/c, advanced to clear liquid diet 1/24- advanced to full liquids  Spoke with pt at bedside, who was sitting in recliner chair at time of visit. Pt in good spirits, laughing and joking with this RD. He reports good tolerance of liquids- he consumed 100% of orange juice, milk, and ice cream this morning (he did not consume the grits because he did not like them). Pt shares that he still does not really have the desire to eat "unless it is a steak". Pt reports he continues to feel distended and gassy, but has no BM. Case discussed with RN, who confirmed that he was given miralax earlier this shift. He continues to walk the hallways.   Discussed with pt importance of good meal and supplement intake to promote healing. Boost Breeze given to pt and RD poured it into a cup with ice for pt to try. Offered other supplements on formulary, but  pt would like to continue with Boost Breeze for now. Discussed items available on full liquids and potential for diet progression.   Labs reviewed: Mg: 1.3 (on IV supplementation). K and Phos WDL.   Diet Order:   Diet Order            Diet full liquid Room service appropriate? Yes; Fluid consistency: Thin  Diet effective now              EDUCATION NEEDS:   Education needs have been addressed  Skin:  Skin Assessment: Reviewed RN Assessment  Last BM:  11/08/18  Height:   Ht Readings from Last 1 Encounters:  11/03/18 6\' 1"  (1.854 m)    Weight:   Wt Readings from Last 1 Encounters:  11/03/18 106.2 kg    Ideal Body Weight:  83.6 kg  BMI:  Body mass index is 30.89 kg/m.  Estimated Nutritional Needs:   Kcal:  2000-2200  Protein:  110-125 grams  Fluid:  > 2.0 L    Tony Zhang A. Tony Zhang, RD, LDN, CDE Pager: [phone] After hours Pager: [phone]

## 2018-11-10 NOTE — Care Management Important Message (Signed)
Important Message  Patient Details  Name: Tony Zhang MRN: [id number] Date of Birth: [birth year]   Medicare Important Message Given:  Yes    [place] Brick P Maui Ahart 11/10/2018, 11:36 AM

## 2018-11-10 NOTE — Progress Notes (Signed)
[facility] Progress Note     Subjective: CC-  CT scan with no signs of SBO, contrast in colon. NG tube removed yesterday. No change in symptoms. Continues to complain of abdominal pain and bloating. No flatus or BM. Some nausea, no emesis. Ate ice cream and jello for dinner last night. Also continues to complain of dysuria, urine culture pending.  Objective: Vital signs in last 24 hours: Temp:  [98.3 F (36.8 C)-98.7 F (37.1 C)] 98.3 F (36.8 C) (01/24 0538) Pulse Rate:  [93-101] 93 (01/24 0538) Resp:  [18-22] 22 (01/24 0538) BP: (105-218)/(51-96) 105/51 (01/24 0538) SpO2:  [96 %-97 %] 96 % (01/24 0538) Last BM Date: 11/08/18  Intake/Output from previous day: 01/23 0701 - 01/24 0700 In: 961 [P.O.:100; I.V.:661; IV Piggyback:200] Out: 1240 [Urine:1240] Intake/Output this shift: No intake/output data recorded.  PE: Gen:  Alert, NAD, pleasant HEENT: EOM's intact, pupils equal and round Pulm:  effort normal Abd: Soft, protuberant, mild diffuse TTP without rebound or guarding, few BS heard Skin: warm and dry   Lab Results:  Recent Labs    11/09/18 0229 11/10/18 0407  WBC 6.4 6.6  HGB 14.0 13.0  HCT 42.1 39.7  PLT 211 189   BMET Recent Labs    11/09/18 0229 11/10/18 0407  NA 140 137  K 3.4* 3.6  CL 109 107  CO2 22 22  GLUCOSE 105* 97  BUN 12 10  CREATININE 1.21 1.09  CALCIUM 9.0 8.8*   PT/INR Recent Labs    11/09/18 0229 11/10/18 0407  LABPROT 17.0* 15.9*  INR 1.40 1.28   CMP     Component Value Date/Time   NA 137 11/10/2018 0407   K 3.6 11/10/2018 0407   CL 107 11/10/2018 0407   CO2 22 11/10/2018 0407   GLUCOSE 97 11/10/2018 0407   BUN 10 11/10/2018 0407   CREATININE 1.09 11/10/2018 0407   CREATININE 1.56 (H) 09/17/2013 1135   CALCIUM 8.8 (L) 11/10/2018 0407   PROT 6.5 11/04/2018 0315   ALBUMIN 3.0 (L) 11/10/2018 0407   AST 16 11/04/2018 0315   ALT 13 11/04/2018 0315   ALKPHOS 49 11/04/2018 0315   BILITOT 1.0 11/04/2018  0315   GFRNONAA >60 11/10/2018 0407   GFRAA >60 11/10/2018 0407   Lipase     Component Value Date/Time   LIPASE 36 11/03/2018 1420       Studies/Results: Ct Abdomen Pelvis W Contrast  Result Date: 11/09/2018 CLINICAL DATA:  Abdominal pain and distention. EXAM: CT ABDOMEN AND PELVIS WITH CONTRAST TECHNIQUE: Multidetector CT imaging of the abdomen and pelvis was performed using the standard protocol following bolus administration of intravenous contrast. CONTRAST:  100mL OMNIPAQUE IOHEXOL 300 MG/ML  SOLN COMPARISON:  11/03/2018. 03/05/2018. FINDINGS: Lower chest: The heart size is normal. No substantial pericardial effusion. Coronary artery calcification is evident. 14 mm lymph node identified inferior right hilum but incompletely visualized. This is similar in appearance to the chest CT of 03/05/2018. Hepatobiliary: 1.8 cm low-density lesion medial right liver stable since prior study and also comparing back to 03/05/2018 when I remeasure it on that exam. There is no evidence for gallstones, gallbladder wall thickening, or pericholecystic fluid. No intrahepatic or extrahepatic biliary dilation. Pancreas: No focal mass lesion. No dilatation of the main duct. No intraparenchymal cyst. No peripancreatic edema. Spleen: No splenomegaly. No focal mass lesion. Adrenals/Urinary Tract: No adrenal nodule or mass. Multiple cortical and central sinus cysts are identified in the kidneys bilaterally. Several low-density lesions in each kidney  are too small to characterize. 2.2 cm lesion lower pole right kidney (21/8) has attenuation higher than would be expected for a simple cyst and is new in the interval since 03/05/2018. No evidence for hydroureter. The urinary bladder appears normal for the degree of distention. Stomach/Bowel: Stomach is nondistended. No gastric wall thickening. No evidence of outlet obstruction. Duodenum is normally positioned as is the ligament of Treitz. 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 No small bowel wall thickening. No small bowel dilatation. The terminal ileum is normal. The appendix is not visualized, but there is no edema or inflammation in the region of the cecum. No gross colonic mass. No colonic wall thickening. 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 There is abdominal aortic atherosclerosis without aneurysm. Persistent left-sided IVC noted. There is no gastrohepatic or hepatoduodenal ligament lymphadenopathy. No intraperitoneal or retroperitoneal lymphadenopathy. No pelvic sidewall lymphadenopathy. Reproductive: The prostate gland and seminal vesicles are unremarkable. Other: No intraperitoneal free fluid. Musculoskeletal: Tiny right and small left groin hernias contain only fat. No worrisome lytic or sclerotic osseous abnormality. IMPRESSION: 1. No acute findings in the abdomen or pelvis. Specifically, no findings to explain the patient's history of abdominal pain and distension. Small bowel loops are nondilated. Oral contrast material has migrated through to the level of the transverse colon. 2. 2.2 cm lesion lower pole right kidney is new since 03/05/2018 and has attenuation higher than would be expected for a simple cyst. This may be a cyst complicated by proteinaceous debris or hemorrhage, but neoplasm could have this appearance. Nonemergent outpatient follow-up MRI of the abdomen without and with contrast recommended to further evaluate. 3. 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 Several low-density lesions in each kidney are too small to characterize. These could also be further assessed at the time of MRI. 4. 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 5. Persistent left-sided IVC. Electronically Signed   By: Kennith CenterEric  Mansell M.D.   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 Will add oral bowel regimen with Miralax/colace. Monitor electrolytes and recommend keeping K>4 and Mag>2. Continue ambulating. No role for surgical intervention. Ok to restart coumadin.  FEN - FLD, Boost VTE -heparin gtt ID -[id number]/19>>1/21   LOS: 7 days    Franne FortsBrooke A Jaret Coppedge , [facility] 11/10/2018, 8:54 AM Pager: [phone] Mon-Thurs 7:00 am-4:30 pm Fri 7:00 am -11:30 AM Sat-Sun 7:00 am-11:30 am

## 2018-11-10 NOTE — Progress Notes (Signed)
PROGRESS NOTE  Tony MinorsJerry F Klipfel JXB:[id number] DOB: [birth year] DOA: 11/03/2018 PCP: [name], [name], MD  HPI/Recap of past 24 hours: Tony Zhang is a 73 y.o. 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 Presents with complaints of 3-day history of nausea and vomiting.  Associated with significant abdominal pain and distention.  CT scan of the abdomen pelvis w contrast positive for small bowel obstruction.  General surgery consulted and following.   Hospital course complicated by dysuria which was present prior to admission.  Started on Rocephin empirically.  Urine culture is pending.   11/06/2018: Patient seen and examined at bedside.  No acute events overnight.  Reported abdominal pain is improving.  Has intermittent nausea with no vomiting.  NG tube in place with persistent output.  General surgery following.  11/07/2018: Patient seen.  NG to low suction in situ.  Surgical input is highly appreciated.  11/08/2018: Patient seen.  No new changes.  Patient tells me that he has some bowel movements after enema yesterday.  Still having significant drainage at the NG tube.  Surgical input is appreciated.  11/09/2018: Still having significant output from the NG tube.  Significant for what sounds is appreciated.  Surgical input is highly appreciated.  CT scan result is also noted.  11/10/2018: Patient seen.  NG tube was discontinued yesterday.  Low potassium and magnesium noted (potassium is 3.6 and magnesium is 1.3 today).  Will replete.  Patient continues to report abdominal bloating.  Has not broken wind in the last 24 hours or so.  We will get an abdominal x-ray.  Assessment/Plan: Principal Problem:   Small bowel obstruction (HCC) Active Problems:   AAA (abdominal aortic aneurysm) (HCC)   Chronic distal aortic occlusion (HCC)   DVT, lower extremity and  pulmonary embolism, recurrent   Hyperlipidemia   COPD (chronic obstructive pulmonary disease) (HCC)   OSA (obstructive sleep apnea)   Peripheral vascular disease (HCC)   Anticoagulated on Coumadin  Sepsis secondary to small bowel obstruction versus UTI, both present on admission Presented with abdominal pain, distention, leukocytosis with WBC 21,000, heart rate of 111 and tachypnea with respiratory rate of 29. CT abdomen and pelvis with contrast done on 11/03/2018 revealed findings consistent with early complete or partial small bowel obstruction with marked distention of the stomach and jejunum. Self reports prior abdominal surgeries from cholecystectomy and hernia repair  Now positive UA with dysuria and polyuria Continue Rocephin daily, day #2 Sepsis criteria is resolving  Urine culture positive greater than 100,000 colonies of staph epidermidis Continue n.p.o. Continue to monitor output from NG tube suction Continue IV fluids, IV Zofran, pain management with IV fentanyl as needed 11/07/2018: No sepsis syndrome/physiology elicited.  Continue management for small bowel obstruction. 11/10/2018: We will repeat abdominal x-ray.  Further management will depend on above.  Patient is also encouraged to ambulate.  UTI, present on admission Management as stated above 11/07/2018: Urine culture grew staph epidermidis.  Will repeat urinalysis and urine culture.  Will obtain blood culture.  Will discontinue Rocephin.  Possible CKD 2 versus AKI:  11/07/2018: Noted prior documentation of AKI on CKD 3  Information visualized so far will suggest antibiotics possible chronic kidney disease stage II.   Continue to monitor. 11/10/2018: Care has resolved.  Hypomagnesemia: Magnesium was 1.3 on presentation. Repleted. Continue to monitor. 11/09/2018: Magnesium as of 11/04/2018 was 2.4.  Continue to monitor. 11/10/2018: Magnesium is  1.3 today.  We will give supplemental magnesium.  Hypokalemia Continue to  monitor potassium. Renal panel in a.m. 11/09/2018: Potassium today is 3.4.  Continue to monitor and replete. 11/10/2018: Potassium is 3.6 today.  Will replete.  Recurrent DVT and PE. Patient is on chronic anticoagulation with warfarin. Coumadin is on hold INR 1.86 on 11/06/2018 Heparin drip until cleared from general surgery Pharmacy is managing heparin and Coumadin  11/10/2018: Pharmacy consulted to transition from heparin drip to Coumadin as no surgery is planned.  Chronic distal aortic occlusion Diagnosed 5 years ago Seen again on CT abdomen and pelvis with contrast on admission  History of upper GI bleed Self-reported had an endoscopy in the past and treated for upper GI bleed Hemoglobin is stable No sign of overt bleeding  Peripheral vascular disease. Patient is on Pletal at home. Currently on hold as patient is n.p.o. Monitor.  Bilateral renal artery stenosis Diagnosed 5 years ago  History of COPD. Continue inhalers. Does not appear to be having any exacerbation right now.  Body mass index is 31 kg/m.  Obesity.  Chronic pain syndrome - [facility] Controlled Substance Reporting System database was reviewed. - Last prescription for controlled substance was on December 2019 for Norco. -Continue here in the hospital once patient is able to take p.o.  Risk: High risk for decompensation due to ongoing small bowel obstruction, severe dyselectrolytemia, multiple comorbidities, and advanced age.  Nutrition: NPO DVT Prophylaxis: Heparin drip  Advance goals of care discussion: Full code   Consults: General surgery   Family Communication: no family was present at bedside, at the time of interview.  Disposition: To depend on hospital course.  Objective: Vitals:   11/09/18 1300 11/09/18 1535 11/09/18 2144 11/10/18 0538  BP: (!) 218/96 (!) 159/80 (!) 152/91 (!) 105/51  Pulse: (!) 101  99 93  Resp: 18  20 (!) 22  Temp: 98.4 F (36.9 C)  98.7 F (37.1 C)  98.3 F (36.8 C)  TempSrc: Oral  Oral Oral  SpO2: 97%  97% 96%  Weight:      Height:        Intake/Output Summary (Last 24 hours) at 11/10/2018 1155 Last data filed at 11/10/2018 0600 Gross per 24 hour  Intake 687.15 ml  Output 1040 ml  Net -352.85 ml   Filed Weights   11/03/18 1350 11/03/18 2043  Weight: 106.6 kg 106.2 kg    Exam:  . General: Obese.  Not in any distress.  NG tube to low suction in situ.   . Cardiovascular: Regular rate and rhythm. [name] Respiratory: Clear to Auscultation.   . Abdomen: Abdomen is obese, soft and nontender.  Episodic bowel sounds right abdominal area.    Data Reviewed: CBC: Recent Labs  Lab 11/03/18 1420  11/04/18 0315  11/05/18 0342 11/06/18 0147 11/07/18 0503 11/09/18 0229 11/10/18 0407  WBC 21.2*  --  13.9*   < > 9.7 8.0 7.8 6.4 6.6  NEUTROABS 18.4*  --  11.7*  --   --   --   --   --   --   HGB 18.4*   < > 14.2   < > 14.3 13.2 15.0 14.0 13.0  HCT 53.7*   < > 42.7   < > 42.0 39.5 44.6 42.1 39.7  MCV 95.9  --  94.3   < > 97.0 95.0 96.1 95.5 95.7  PLT 434*  --  246   < > 230 204 190 211 189   < > =  values in this interval not displayed.   Basic Metabolic Panel: Recent Labs  Lab 11/04/18 0315 11/04/18 1754 11/05/18 0342 11/06/18 0147 11/07/18 0503 11/09/18 0229 11/10/18 0407  NA 137 134* 139 139 138 140 137  K 3.5 3.4* 3.4* 3.3* 3.9 3.4* 3.6  CL 108 105 106 104 106 109 107  CO2 17* 19* 22 22 19* 22 22  GLUCOSE 135* 112* 89 94 103* 105* 97  BUN 32* 28* 23 16 12 12 10   CREATININE 1.75* 1.48* 1.28* 1.26* 1.14 1.21 1.09  CALCIUM 8.3* 8.0* 8.2* 8.3* 9.0 9.0 8.8*  MG 1.3* 2.4  --   --   --   --  1.3*  PHOS  --   --   --   --   --   --  3.5   GFR: Estimated Creatinine Clearance: 77.2 mL/min (by C-G formula based on SCr of 1.09 mg/dL). Liver Function Tests: Recent Labs  Lab 11/03/18 1420 11/04/18 0315 11/10/18 0407  AST 25 16  --   ALT 14 13  --   ALKPHOS 51 49  --   BILITOT 1.1 1.0  --   PROT 8.1 6.5  --   ALBUMIN 4.5  3.4* 3.0*   Recent Labs  Lab 11/03/18 1420  LIPASE 36   No results for input(s): AMMONIA in the last 168 hours. Coagulation Profile: Recent Labs  Lab 11/06/18 0147 11/07/18 0503 11/08/18 0259 11/09/18 0229 11/10/18 0407  INR 1.86 1.56 1.47 1.40 1.28   Cardiac Enzymes: No results for input(s): CKTOTAL, CKMB, CKMBINDEX, TROPONINI in the last 168 hours. BNP (last 3 results) No results for input(s): PROBNP in the last 8760 hours. HbA1C: No results for input(s): HGBA1C in the last 72 hours. CBG: No results for input(s): GLUCAP in the last 168 hours. Lipid Profile: No results for input(s): CHOL, HDL, LDLCALC, TRIG, CHOLHDL, LDLDIRECT in the last 72 hours. Thyroid Function Tests: No results for input(s): TSH, T4TOTAL, FREET4, T3FREE, THYROIDAB in the last 72 hours. Anemia Panel: No results for input(s): VITAMINB12, FOLATE, FERRITIN, TIBC, IRON, RETICCTPCT in the last 72 hours. Urine analysis:    Component Value Date/Time   COLORURINE AMBER (A) 11/09/2018 1250   APPEARANCEUR CLEAR 11/09/2018 1250   LABSPEC 1.015 11/09/2018 1250   PHURINE 6.0 11/09/2018 1250   GLUCOSEU NEGATIVE 11/09/2018 1250   HGBUR SMALL (A) 11/09/2018 1250   BILIRUBINUR NEGATIVE 11/09/2018 1250   KETONESUR NEGATIVE 11/09/2018 1250   PROTEINUR NEGATIVE 11/09/2018 1250   UROBILINOGEN 0.2 04/08/2015 2033   NITRITE NEGATIVE 11/09/2018 1250   LEUKOCYTESUR NEGATIVE 11/09/2018 1250   Sepsis Labs: @LABRCNTIP (procalcitonin:4,lacticidven:4)  ) Recent Results (from the past 240 hour(s))  Culture, Urine     Status: Abnormal   Collection Time: 11/05/18  9:22 AM  Result Value Ref Range Status   Specimen Description URINE, CLEAN CATCH  Final   Special Requests   Final    Normal Performed at [facility], [address], [place], [place] [address]    Culture >=100,000 COLONIES/mL STAPHYLOCOCCUS EPIDERMIDIS (A)  Final   Report Status 11/07/2018 FINAL  Final   Organism ID, Bacteria STAPHYLOCOCCUS  EPIDERMIDIS (A)  Final      Susceptibility   Staphylococcus epidermidis - MIC*    CIPROFLOXACIN >=8 RESISTANT Resistant     GENTAMICIN <=0.5 SENSITIVE Sensitive     NITROFURANTOIN <=16 SENSITIVE Sensitive     OXACILLIN <=0.25 SENSITIVE Sensitive     TETRACYCLINE 4 SENSITIVE Sensitive     VANCOMYCIN 1 SENSITIVE  Sensitive     TRIMETH/SULFA <=10 SENSITIVE Sensitive     CLINDAMYCIN <=0.25 SENSITIVE Sensitive     RIFAMPIN <=0.5 SENSITIVE Sensitive     Inducible Clindamycin NEGATIVE Sensitive     * >=100,000 COLONIES/mL STAPHYLOCOCCUS EPIDERMIDIS  Culture, Urine     Status: Abnormal   Collection Time: 11/07/18  6:05 PM  Result Value Ref Range Status   Specimen Description URINE, CLEAN CATCH  Final   Special Requests NONE  Final   Culture (A)  Final    <10,000 COLONIES/mL INSIGNIFICANT GROWTH Performed at [facility], [address] [place], [place] [address]    Report Status 11/09/2018 FINAL  Final  Culture, blood (routine x 2)     Status: None (Preliminary result)   Collection Time: 11/07/18  6:36 PM  Result Value Ref Range Status   Specimen Description BLOOD RIGHT ANTECUBITAL  Final   Special Requests   Final    BOTTLES DRAWN AEROBIC AND ANAEROBIC Blood Culture results may not be optimal due to an excessive volume of blood received in culture bottles   Culture   Final    NO GROWTH 2 DAYS Performed at [facility], [address] [place], [place] [address]    Report Status PENDING  Incomplete  Culture, blood (routine x 2)     Status: None (Preliminary result)   Collection Time: 11/07/18  6:36 PM  Result Value Ref Range Status   Specimen Description BLOOD LEFT HAND  Final   Special Requests   Final    BOTTLES DRAWN AEROBIC AND ANAEROBIC Blood Culture results may not be optimal due to an excessive volume of blood received in culture bottles   Culture   Final    NO GROWTH 2 DAYS Performed at [facility], [address] [place], [place] [address]    Report  Status PENDING  Incomplete      Studies: Ct Abdomen Pelvis W Contrast  Result Date: 11/09/2018 CLINICAL DATA:  Abdominal pain and distention. EXAM: CT ABDOMEN AND PELVIS WITH CONTRAST TECHNIQUE: Multidetector CT imaging of the abdomen and pelvis was performed using the standard protocol following bolus administration of intravenous contrast. CONTRAST:  100mL OMNIPAQUE IOHEXOL 300 MG/ML  SOLN COMPARISON:  11/03/2018. 03/05/2018. FINDINGS: Lower chest: The heart size is normal. No substantial pericardial effusion. Coronary artery calcification is evident. 14 mm lymph node identified inferior right hilum but incompletely visualized. This is similar in appearance to the chest CT of 03/05/2018. Hepatobiliary: 1.8 cm low-density lesion medial right liver stable since prior study and also comparing back to 03/05/2018 when I remeasure it on that exam. There is no evidence for gallstones, gallbladder wall thickening, or pericholecystic fluid. No intrahepatic or extrahepatic biliary dilation. Pancreas: No focal mass lesion. No dilatation of the main duct. No intraparenchymal cyst. No peripancreatic edema. Spleen: No splenomegaly. No focal mass lesion. Adrenals/Urinary Tract: No adrenal nodule or mass. Multiple cortical and central sinus cysts are identified in the kidneys bilaterally. Several low-density lesions in each kidney are too small to characterize. 2.2 cm lesion lower pole right kidney (21/8) has attenuation higher than would be expected for a simple cyst and is new in the interval since 03/05/2018. No evidence for hydroureter. The urinary bladder appears normal for the degree of distention. Stomach/Bowel: Stomach is nondistended. No gastric wall thickening. No evidence of outlet obstruction. Duodenum is normally positioned as is the ligament of Treitz. As on prior studies, multiple small fat density polypoid lesions in the  transverse duodenum are noted compatible with lipomas. No small bowel wall thickening.  No small bowel dilatation. The terminal ileum is normal. The appendix is not visualized, but there is no edema or inflammation in the region of the cecum. No gross colonic mass. No colonic wall thickening. 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 There is abdominal aortic atherosclerosis without aneurysm. Persistent left-sided IVC noted. There is no gastrohepatic or hepatoduodenal ligament lymphadenopathy. No intraperitoneal or retroperitoneal lymphadenopathy. No pelvic sidewall lymphadenopathy. Reproductive: The prostate gland and seminal vesicles are unremarkable. Other: No intraperitoneal free fluid. Musculoskeletal: Tiny right and small left groin hernias contain only fat. No worrisome lytic or sclerotic osseous abnormality. IMPRESSION: 1. No acute findings in the abdomen or pelvis. Specifically, no findings to explain the patient's history of abdominal pain and distension. Small bowel loops are nondilated. Oral contrast material has migrated through to the level of the transverse colon. 2. 2.2 cm lesion lower pole right kidney is new since 03/05/2018 and has attenuation higher than would be expected for a simple cyst. This may be a cyst complicated by proteinaceous debris or hemorrhage, but neoplasm could have this appearance. Nonemergent outpatient follow-up MRI of the abdomen without and with contrast recommended to further evaluate. 3. 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 Several low-density lesions in each kidney are too small to characterize. These could also be further assessed at the time of MRI. 4. 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 5. Persistent left-sided IVC. Electronically Signed   By: Kennith Center M.D.   On: 11/09/2018 15:53    Scheduled Meds: . docusate sodium  100 mg Oral BID  . feeding supplement  1 Container Oral TID BM  . mouth rinse  15 mL Mouth Rinse BID  .  polyethylene glycol  17 g Oral Daily  . sodium chloride flush  3 mL Intravenous Q12H    Continuous Infusions: . dextrose 5% lactated ringers with KCl 20 mEq/L 50 mL/hr at 11/09/18 1757  . heparin 2,400 Units/hr (11/10/18 0809)  . magnesium sulfate 1 - 4 g bolus IVPB    . sodium chloride       LOS: 7 days     Barnetta Chapel, MD [facility] Pager [phone] If 7PM-7AM, please contact night-coverage [email or web address] Password [facility] 11/10/2018, 11:55 AM

## 2018-11-11 LAB — HEPARIN LEVEL (UNFRACTIONATED): Heparin Unfractionated: 0.42 IU/mL (ref 0.30–0.70)

## 2018-11-11 LAB — CBC WITH DIFFERENTIAL/PLATELET
Abs Immature Granulocytes: 0.22 10*3/uL — ABNORMAL HIGH (ref 0.00–0.07)
Basophils Absolute: 0 10*3/uL (ref 0.0–0.1)
Basophils Relative: 1 %
Eosinophils Absolute: 0.2 10*3/uL (ref 0.0–0.5)
Eosinophils Relative: 3 %
HCT: 37.7 % — ABNORMAL LOW (ref 39.0–52.0)
Hemoglobin: 12.8 g/dL — ABNORMAL LOW (ref 13.0–17.0)
Immature Granulocytes: 4 %
Lymphocytes Relative: 9 %
Lymphs Abs: 0.5 10*3/uL — ABNORMAL LOW (ref 0.7–4.0)
MCH: 32.3 pg (ref 26.0–34.0)
MCHC: 34 g/dL (ref 30.0–36.0)
MCV: 95.2 fL (ref 80.0–100.0)
Monocytes Absolute: 0.5 10*3/uL (ref 0.1–1.0)
Monocytes Relative: 9 %
Neutro Abs: 4.5 10*3/uL (ref 1.7–7.7)
Neutrophils Relative %: 74 %
Platelets: 180 10*3/uL (ref 150–400)
RBC: 3.96 MIL/uL — ABNORMAL LOW (ref 4.22–5.81)
RDW: 14.2 % (ref 11.5–15.5)
WBC: 6.1 10*3/uL (ref 4.0–10.5)
nRBC: 0 % (ref 0.0–0.2)

## 2018-11-11 LAB — RENAL FUNCTION PANEL
Albumin: 2.9 g/dL — ABNORMAL LOW (ref 3.5–5.0)
Albumin: 3.4 g/dL — ABNORMAL LOW (ref 3.5–5.0)
Anion gap: 10 (ref 5–15)
Anion gap: 13 (ref 5–15)
BUN: 9 mg/dL (ref 8–23)
BUN: 9 mg/dL (ref 8–23)
CO2: 21 mmol/L — ABNORMAL LOW (ref 22–32)
CO2: 20 mmol/L — ABNORMAL LOW (ref 22–32)
Calcium: 8.7 mg/dL — ABNORMAL LOW (ref 8.9–10.3)
Calcium: 9.1 mg/dL (ref 8.9–10.3)
Chloride: 106 mmol/L (ref 98–111)
Chloride: 103 mmol/L (ref 98–111)
Creatinine, Ser: 1.03 mg/dL (ref 0.61–1.24)
Creatinine, Ser: 1.19 mg/dL (ref 0.61–1.24)
GFR calc Af Amer: >60 mL/min (ref >60)
GFR calc Af Amer: >60 mL/min (ref >60)
GFR calc non Af Amer: >60 mL/min (ref >60)
GFR calc non Af Amer: >60 mL/min (ref >60)
Glucose, Bld: 127 mg/dL — ABNORMAL HIGH (ref 70–99)
Glucose, Bld: 113 mg/dL — ABNORMAL HIGH (ref 70–99)
Phosphorus: 3.3 mg/dL (ref 2.5–4.6)
Phosphorus: 3.4 mg/dL (ref 2.5–4.6)
Potassium: 3.4 mmol/L — ABNORMAL LOW (ref 3.5–5.1)
Potassium: 3.9 mmol/L (ref 3.5–5.1)
Sodium: 137 mmol/L (ref 135–145)
Sodium: 136 mmol/L (ref 135–145)

## 2018-11-11 LAB — URINE CULTURE: Culture: NO GROWTH

## 2018-11-11 LAB — MAGNESIUM
Magnesium: 1.6 mg/dL — ABNORMAL LOW (ref 1.7–2.4)
Magnesium: 1.5 mg/dL — ABNORMAL LOW (ref 1.7–2.4)

## 2018-11-11 LAB — PROTIME-INR
INR: 1.25
Prothrombin Time: 15.6 seconds — ABNORMAL HIGH (ref 11.4–15.2)

## 2018-11-11 MED ORDER — HYDRALAZINE HCL 25 MG PO TABS: 25.0000 mg | ORAL_TABLET | ORAL | Status: DC | PRN

## 2018-11-11 MED ORDER — WARFARIN SODIUM 2 MG PO TABS
2.0000 mg | ORAL_TABLET | Freq: Once | ORAL | Status: AC
  Administered 2018-11-11: 2 mg via ORAL
  Filled 2018-11-11: qty 1

## 2018-11-11 MED ORDER — MAGNESIUM SULFATE 2 GM/50ML IV SOLN
2.0000 g | Freq: Once | INTRAVENOUS | Status: AC
  Administered 2018-11-11: 2 g via INTRAVENOUS
  Filled 2018-11-11: qty 50

## 2018-11-11 MED ORDER — POTASSIUM CHLORIDE CRYS ER 20 MEQ PO TBCR
40.0000 meq | EXTENDED_RELEASE_TABLET | ORAL | Status: AC
  Administered 2018-11-11 (×2): 40 meq via ORAL
  Filled 2018-11-11 (×2): qty 2

## 2018-11-11 NOTE — Discharge Instructions (Signed)

## 2018-11-11 NOTE — Progress Notes (Signed)
PROGRESS NOTE  Tony MinorsJerry F Stallings ZOX:[id number] DOB: [birth year] DOA: 11/03/2018 PCP: [name], [name], MD  HPI/Recap of past 24 hours: Tony Zhang is a 73 y.o. 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 Presents with complaints of 3-day history of nausea and vomiting.  Associated with significant abdominal pain and distention.  CT scan of the abdomen pelvis w contrast positive for small bowel obstruction.  General surgery consulted and following.   Hospital course complicated by dysuria which was present prior to admission.  Started on Rocephin empirically.  Urine culture is pending.   11/06/2018: Patient seen and examined at bedside.  No acute events overnight.  Reported abdominal pain is improving.  Has intermittent nausea with no vomiting.  NG tube in place with persistent output.  General surgery following.  11/07/2018: Patient seen.  NG to low suction in situ.  Surgical input is highly appreciated.  11/08/2018: Patient seen.  No new changes.  Patient tells me that he has some bowel movements after enema yesterday.  Still having significant drainage at the NG tube.  Surgical input is appreciated.  11/09/2018: Still having significant output from the NG tube.  Significant for what sounds is appreciated.  Surgical input is highly appreciated.  CT scan result is also noted.  11/10/2018: Patient seen.  NG tube was discontinued yesterday.  Low potassium and magnesium noted (potassium is 3.6 and magnesium is 1.3 today).  Will replete.  Patient continues to report abdominal bloating.  Has not broken wind in the last 24 hours or so.  We will get an abdominal x-ray.    11/11/2018: Patient seen.  Patient actually looks better today.  Patient has been breaking wind.  Surgical input is appreciated.  Potassium is 3.4 and magnesium is 1.6.  Nursing staff reports  nonsustained V. tach.  Will optimize a electrolytes.  Potassium and magnesium are reviewed replete.  We will repeat renal panel as well as magnesium.  We will pursue an echocardiogram (no prior echocardiogram noted).  Patient may have undiagnosed OSA.  Will encourage formal sleep study on discharge, if not already done.    Assessment/Plan: Principal Problem:   Small bowel obstruction (HCC) Active Problems:   AAA (abdominal aortic aneurysm) (HCC)   Chronic distal aortic occlusion (HCC)   DVT, lower extremity and pulmonary embolism, recurrent   Hyperlipidemia   COPD (chronic obstructive pulmonary disease) (HCC)   OSA (obstructive sleep apnea)   Peripheral vascular disease (HCC)   Anticoagulated on Coumadin  Sepsis secondary to small bowel obstruction versus UTI, both present on admission Presented with abdominal pain, distention, leukocytosis with WBC 21,000, heart rate of 111 and tachypnea with respiratory rate of 29. CT abdomen and pelvis with contrast done on 11/03/2018 revealed findings consistent with early complete or partial small bowel obstruction with marked distention of the stomach and jejunum. Self reports prior abdominal surgeries from cholecystectomy and hernia repair  Now positive UA with dysuria and polyuria Continue Rocephin daily, day #2 Sepsis criteria is resolving  Urine culture positive greater than 100,000 colonies of staph epidermidis Continue n.p.o. Continue to monitor output from NG tube suction Continue IV fluids, IV Zofran, pain management with IV fentanyl as needed 11/07/2018: No sepsis syndrome/physiology elicited.  Continue management for small bowel obstruction. 11/10/2018: We will repeat abdominal x-ray.  Further management will depend on above.  Patient is also encouraged to ambulate. 11/11/2018: GI symptoms seem to  be improving.  Surgical input appreciated.  Likely follow with GI also during discharge, as well as a primary care provider.  UTI, present on  admission Management as stated above 11/07/2018: Urine culture grew staph epidermidis.  Will repeat urinalysis and urine culture.  Will obtain blood culture.  Will discontinue Rocephin.  Possible CKD 2 versus AKI:  11/07/2018: Noted prior documentation of AKI on CKD 3  Information visualized so far will suggest antibiotics possible chronic kidney disease stage II.   Continue to monitor. 11/10/2018: Care has resolved.  Hypomagnesemia: Magnesium was 1.3 on presentation. Repleted. Continue to monitor. 11/09/2018: Magnesium as of 11/04/2018 was 2.4.  Continue to monitor. 11/10/2018: Magnesium is 1.3 today.  We will give supplemental magnesium.  Hypokalemia Continue to monitor potassium. Renal panel in a.m. 11/09/2018: Potassium today is 3.4.  Continue to monitor and replete. 11/10/2018: Potassium is 3.6 today.  Will replete.  Recurrent DVT and PE. Patient is on chronic anticoagulation with warfarin. Coumadin is on hold INR 1.86 on 11/06/2018 Heparin drip until cleared from general surgery Pharmacy is managing heparin and Coumadin  11/10/2018: Pharmacy consulted to transition from heparin drip to Coumadin as no surgery is planned.  Chronic distal aortic occlusion Diagnosed 5 years ago Seen again on CT abdomen and pelvis with contrast on admission  History of upper GI bleed Self-reported had an endoscopy in the past and treated for upper GI bleed Hemoglobin is stable No sign of overt bleeding  Peripheral vascular disease. Patient is on Pletal at home. Currently on hold as patient is n.p.o. Monitor.  Bilateral renal artery stenosis Diagnosed 5 years ago  History of COPD. Continue inhalers. Does not appear to be having any exacerbation right now.  Body mass index is 31 kg/m.  Obesity.  Chronic pain syndrome - [facility] Controlled Substance Reporting System database was reviewed. - Last prescription for controlled substance was on December 2019 for Norco. -Continue  here in the hospital once patient is able to take p.o.  Risk: High risk for decompensation due to ongoing small bowel obstruction, severe dyselectrolytemia, multiple comorbidities, and advanced age.  Nutrition: NPO DVT Prophylaxis: Heparin drip  Advance goals of care discussion: Full code   Consults: General surgery   Family Communication: no family was present at bedside, at the time of interview.  Disposition: To depend on hospital course.  Objective: Vitals:   11/10/18 0538 11/10/18 1530 11/10/18 2115 11/11/18 0358  BP: (!) 105/51 (!) 154/84 (!) 169/94 (!) 154/83  Pulse: 93 88 89 89  Resp: (!) 22 17 20 18   Temp: 98.3 F (36.8 C) 97.6 F (36.4 C) 98 F (36.7 C) 98.2 F (36.8 C)  TempSrc: Oral Oral Oral Oral  SpO2: 96% 97% 96% 96%  Weight:      Height:        Intake/Output Summary (Last 24 hours) at 11/11/2018 1051 Last data filed at 11/11/2018 0730 Gross per 24 hour  Intake 1026 ml  Output 825 ml  Net 201 ml   Filed Weights   11/03/18 1350 11/03/18 2043  Weight: 106.6 kg 106.2 kg    Exam:  . General: Obese.  Not in any distress.  NG tube to low suction in situ.   . Cardiovascular: Regular rate and rhythm. [name] Respiratory: Clear to Auscultation.   Abdomen: Abdomen is obese, soft and nontender.  Bowel sounds are normoactive.    Data Reviewed: CBC: Recent Labs  Lab 11/06/18 0147 11/07/18 0503 11/09/18 0229 11/10/18 0407 11/11/18 0347  WBC 8.0  7.8 6.4 6.6 6.1  NEUTROABS  --   --   --   --  4.5  HGB 13.2 15.0 14.0 13.0 12.8*  HCT 39.5 44.6 42.1 39.7 37.7*  MCV 95.0 96.1 95.5 95.7 95.2  PLT 204 190 211 189 180   Basic Metabolic Panel: Recent Labs  Lab 11/04/18 1754  11/06/18 0147 11/07/18 0503 11/09/18 0229 11/10/18 0407 11/11/18 0347  NA 134*   < > 139 138 140 137 137  K 3.4*   < > 3.3* 3.9 3.4* 3.6 3.4*  CL 105   < > 104 106 109 107 106  CO2 19*   < > 22 19* 22 22 21*  GLUCOSE 112*   < > 94 103* 105* 97 127*  BUN 28*   < > 16 12 12 10 9     CREATININE 1.48*   < > 1.26* 1.14 1.21 1.09 1.03  CALCIUM 8.0*   < > 8.3* 9.0 9.0 8.8* 8.7*  MG 2.4  --   --   --   --  1.3* 1.6*  PHOS  --   --   --   --   --  3.5 3.3   < > = values in this interval not displayed.   GFR: Estimated Creatinine Clearance: 81.7 mL/min (by C-G formula based on SCr of 1.03 mg/dL). Liver Function Tests: Recent Labs  Lab 11/10/18 0407 11/11/18 0347  ALBUMIN 3.0* 2.9*   No results for input(s): LIPASE, AMYLASE in the last 168 hours. No results for input(s): AMMONIA in the last 168 hours. Coagulation Profile: Recent Labs  Lab 11/07/18 0503 11/08/18 0259 11/09/18 0229 11/10/18 0407 11/11/18 0347  INR 1.56 1.47 1.40 1.28 1.25   Cardiac Enzymes: No results for input(s): CKTOTAL, CKMB, CKMBINDEX, TROPONINI in the last 168 hours. BNP (last 3 results) No results for input(s): PROBNP in the last 8760 hours. HbA1C: No results for input(s): HGBA1C in the last 72 hours. CBG: No results for input(s): GLUCAP in the last 168 hours. Lipid Profile: No results for input(s): CHOL, HDL, LDLCALC, TRIG, CHOLHDL, LDLDIRECT in the last 72 hours. Thyroid Function Tests: No results for input(s): TSH, T4TOTAL, FREET4, T3FREE, THYROIDAB in the last 72 hours. Anemia Panel: No results for input(s): VITAMINB12, FOLATE, FERRITIN, TIBC, IRON, RETICCTPCT in the last 72 hours. Urine analysis:    Component Value Date/Time   COLORURINE AMBER (A) 11/09/2018 1250   APPEARANCEUR CLEAR 11/09/2018 1250   LABSPEC 1.015 11/09/2018 1250   PHURINE 6.0 11/09/2018 1250   GLUCOSEU NEGATIVE 11/09/2018 1250   HGBUR SMALL (A) 11/09/2018 1250   BILIRUBINUR NEGATIVE 11/09/2018 1250   KETONESUR NEGATIVE 11/09/2018 1250   PROTEINUR NEGATIVE 11/09/2018 1250   UROBILINOGEN 0.2 04/08/2015 2033   NITRITE NEGATIVE 11/09/2018 1250   LEUKOCYTESUR NEGATIVE 11/09/2018 1250   Sepsis Labs: @LABRCNTIP (procalcitonin:4,lacticidven:4)  ) Recent Results (from the past 240 hour(s))  Culture, Urine      Status: Abnormal   Collection Time: 11/05/18  9:22 AM  Result Value Ref Range Status   Specimen Description URINE, CLEAN CATCH  Final   Special Requests   Final    Normal Performed at [facility], [address] [place], [place] [address]    Culture >=100,000 COLONIES/mL STAPHYLOCOCCUS EPIDERMIDIS (A)  Final   Report Status 11/07/2018 FINAL  Final   Organism ID, Bacteria STAPHYLOCOCCUS EPIDERMIDIS (A)  Final      Susceptibility   Staphylococcus epidermidis - MIC*    CIPROFLOXACIN >=8 RESISTANT Resistant  GENTAMICIN <=0.5 SENSITIVE Sensitive     NITROFURANTOIN <=16 SENSITIVE Sensitive     OXACILLIN <=0.25 SENSITIVE Sensitive     TETRACYCLINE 4 SENSITIVE Sensitive     VANCOMYCIN 1 SENSITIVE Sensitive     TRIMETH/SULFA <=10 SENSITIVE Sensitive     CLINDAMYCIN <=0.25 SENSITIVE Sensitive     RIFAMPIN <=0.5 SENSITIVE Sensitive     Inducible Clindamycin NEGATIVE Sensitive     * >=100,000 COLONIES/mL STAPHYLOCOCCUS EPIDERMIDIS  Culture, Urine     Status: Abnormal   Collection Time: 11/07/18  6:05 PM  Result Value Ref Range Status   Specimen Description URINE, CLEAN CATCH  Final   Special Requests NONE  Final   Culture (A)  Final    <10,000 COLONIES/mL INSIGNIFICANT GROWTH Performed at [facility], [address], [place], [place] [address]    Report Status 11/09/2018 FINAL  Final  Culture, blood (routine x 2)     Status: None (Preliminary result)   Collection Time: 11/07/18  6:36 PM  Result Value Ref Range Status   Specimen Description BLOOD RIGHT ANTECUBITAL  Final   Special Requests   Final    BOTTLES DRAWN AEROBIC AND ANAEROBIC Blood Culture results may not be optimal due to an excessive volume of blood received in culture bottles   Culture   Final    NO GROWTH 4 DAYS Performed at [facility], [address] [place], [place] [address]    Report Status PENDING  Incomplete  Culture, blood (routine x 2)     Status: None (Preliminary result)    Collection Time: 11/07/18  6:36 PM  Result Value Ref Range Status   Specimen Description BLOOD LEFT HAND  Final   Special Requests   Final    BOTTLES DRAWN AEROBIC AND ANAEROBIC Blood Culture results may not be optimal due to an excessive volume of blood received in culture bottles   Culture   Final    NO GROWTH 4 DAYS Performed at [facility], [address], [place], [place] [address]    Report Status PENDING  Incomplete  Culture, Urine     Status: None   Collection Time: 11/09/18  6:00 PM  Result Value Ref Range Status   Specimen Description URINE, CLEAN CATCH  Final   Special Requests   Final    NONE Performed at [facility], [address] [place], [place] [address]    Culture NO GROWTH  Final   Report Status 11/11/2018 FINAL  Final      Studies: Dg Abd 1 View  Result Date: 11/10/2018 CLINICAL DATA:  73 year old male with midline abdominal pain, distention and nausea for 1 week. EXAM: ABDOMEN - 1 VIEW COMPARISON:  CT Abdomen and Pelvis 11/09/2018 and earlier. FINDINGS: CT related oral contrast in the large bowel to the sigmoid and excreted IV contrast in the urinary bladder. Non obstructed bowel gas pattern. Stable lung bases. No pneumoperitoneum identified although each of these views might be supine. Iliofemoral calcified atherosclerosis. No acute osseous abnormality identified. IMPRESSION: Negative bowel gas pattern with oral contrast now throughout the colon. Electronically Signed   By: Odessa Fleming M.D.   On: 11/10/2018 12:59    Scheduled Meds: . docusate sodium  100 mg Oral BID  . feeding supplement  1 Container Oral TID BM  . mouth rinse  15 mL Mouth Rinse BID  . multivitamin with minerals  1 tablet Oral Daily  . polyethylene glycol  17 g Oral Daily  .  potassium chloride  40 mEq Oral Q4H  . sodium chloride flush  3 mL Intravenous Q12H  . Warfarin - Pharmacist Dosing Inpatient   Does not apply q1800    Continuous Infusions: . dextrose 5% lactated ringers  with KCl 20 mEq/L 50 mL/hr at 11/11/18 0858  . heparin 2,400 Units/hr (11/11/18 0545)  . magnesium sulfate 1 - 4 g bolus IVPB    . sodium chloride       LOS: 8 days     Barnetta ChapelSylvester I Sagar Tengan, MD [facility] Pager [phone] If 7PM-7AM, please contact night-coverage [email or web address] Password [email or web address] 11/11/2018, 10:51 AM

## 2018-11-11 NOTE — Progress Notes (Signed)
Patient ID: Tony Zhang, male   DOB: 1946/03/07, 73 y.o.   MRN: [id number]   Acute Care Surgery Service Progress Note:    Chief Complaint/Subjective: Still c/o abd bloating/discomfort Reports flatus but no BM No real hungry  Objective: Vital signs in last 24 hours: Temp:  [97.6 F (36.4 C)-98.2 F (36.8 C)] 98.2 F (36.8 C) (01/25 0358) Pulse Rate:  [88-89] 89 (01/25 0358) Resp:  [17-20] 18 (01/25 0358) BP: (154-169)/(83-94) 154/83 (01/25 0358) SpO2:  [96 %-97 %] 96 % (01/25 0358) Last BM Date: 11/08/18  Intake/Output from previous day: 01/24 0701 - 01/25 0700 In: 1326 [P.O.:720; I.V.:500; IV Piggyback:106] Out: 550 [Urine:550] Intake/Output this shift: Total I/O In: -  Out: 275 [Urine:275]  Lungs: cta, nonlabored  Cardiovascular: reg  Abd: soft, obese, mild TTP  Extremities: no edema, +SCDs  Neuro: alert, nonfocal  Lab Results: CBC  Recent Labs    11/10/18 0407 11/11/18 0347  WBC 6.6 6.1  HGB 13.0 12.8*  HCT 39.7 37.7*  PLT 189 180   BMET Recent Labs    11/10/18 0407 11/11/18 0347  NA 137 137  K 3.6 3.4*  CL 107 106  CO2 22 21*  GLUCOSE 97 127*  BUN 10 9  CREATININE 1.09 1.03  CALCIUM 8.8* 8.7*   LFT Hepatic Function Latest Ref Rng & Units 11/11/2018 11/10/2018 11/04/2018  Total Protein 6.5 - 8.1 g/dL - - 6.5  Albumin 3.5 - 5.0 g/dL 2.9(L) 3.0(L) 3.4(L)  AST 15 - 41 U/L - - 16  ALT 0 - 44 U/L - - 13  Alk Phosphatase 38 - 126 U/L - - 49  Total Bilirubin 0.3 - 1.2 mg/dL - - 1.0   PT/INR Recent Labs    11/10/18 0407 11/11/18 0347  LABPROT 15.9* 15.6*  INR 1.28 1.25   ABG No results for input(s): PHART, HCO3 in the last 72 hours.  Invalid input(s): PCO2, PO2  Studies/Results:  Anti-infectives: Anti-infectives (From admission, onward)   Start     Dose/Rate Route Frequency Ordered Stop   11/05/18 1230  cefTRIAXone (ROCEPHIN) 1 g in sodium chloride 0.9 % 100 mL IVPB  Status:  Discontinued     1 g 200 mL/hr over 30 Minutes  Intravenous Every 24 hours 11/05/18 1202 11/07/18 1805      Medications: Scheduled Meds: . docusate sodium  100 mg Oral BID  . feeding supplement  1 Container Oral TID BM  . mouth rinse  15 mL Mouth Rinse BID  . multivitamin with minerals  1 tablet Oral Daily  . polyethylene glycol  17 g Oral Daily  . sodium chloride flush  3 mL Intravenous Q12H  . Warfarin - Pharmacist Dosing Inpatient   Does not apply q1800   Continuous Infusions: . dextrose 5% lactated ringers with KCl 20 mEq/L 50 mL/hr at 11/11/18 0858  . heparin 2,400 Units/hr (11/11/18 0545)  . sodium chloride     PRN Meds:.acetaminophen **OR** acetaminophen, diphenhydrAMINE, fentaNYL (SUBLIMAZE) injection, metoprolol tartrate, ondansetron **OR** ondansetron (ZOFRAN) IV, phenol, promethazine  Assessment/Plan: Patient Active Problem List   Diagnosis Date Noted  . Small bowel obstruction ([place]) 11/03/2018  . History of pulmonary embolism 02/09/2016  . Acute GI bleeding   . Anemia due to blood loss, acute   . GI bleed 04/08/2015  . GIB (gastrointestinal bleeding) 04/08/2015  . AKI (acute kidney injury) ([place]) 04/08/2015  . Empyema lung ([place])   . Loculated pleural effusion   . Pneumothorax   . Empyema, right ([place]) 11/25/2014  .  Persistent right pleural effusion and right lower lobe infiltrate 11/14/2014  . HCAP (healthcare-associated pneumonia) 10/10/2014  . Peripheral vascular disease ([place]) 10/10/2014  . Anticoagulated on Coumadin 10/10/2014  . Supratherapeutic INR 10/10/2014  . Microcytic anemia 09/18/2013  . CAP (community acquired pneumonia) 09/09/2013  . AAA (abdominal aortic aneurysm) ([place]) 08/19/2013  . 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 Malignant HTN with heart disease, w/o CHF, with chronic kidney disease 08/19/2013  . DVT, lower extremity and pulmonary embolism, recurrent 08/19/2013  . Hyperlipidemia 08/19/2013  . Gout 08/19/2013  . COPD (chronic obstructive pulmonary disease) ([place])  08/19/2013  . OSA (obstructive sleep apnea) 08/19/2013  . Abnormal nuclear stress test 08/19/2013  . Bilateral renal artery stenosis ([place]) 08/19/2013  . DVT (deep venous thrombosis) ([place]) 06/28/2012   UTI - per medicine, still complaining of dysuria, resend urine culture AKI on CKD stage III- Cr 1.09 Recurrent DVT and PE - on heparin gtt, INR1.40 Chronic distal aortic occlusion  Hx of UGI bleed PVD Bilateral renal artery stenosis COPD Chronic pain syndrome Obesity Dysuria - Ucx pending  SBO - SBO protocol completed and contrast noted in his colon on delayed films - repeat CT scan 1/23 with no signs of SBO, contrast in colon - NG tube removed 1/23 - Continue full liquid diet, advance as tolerated to soft diet. cont bowel regimen with Miralax/colace. Monitor electrolytes and recommend keeping K>4 and Mag>2. Continue ambulating. No role for surgical intervention. Ok to restart coumadin.  FEN - soft diet as tolerated, Boost VTE -heparin gtt ID -[id number]>>1/21  Disposition: his initial films were not terribly impressive for SBO. Most recent films show no bowel dilation. Discussed with pt it may take time for his intestines to 'normalize' and his appetite will be off. Encourage protein shakes if not ready for solids.   Pt would probably benefit from outpt GI evaluation to consider other pathology causing his bloating/discomfort - IBS?   LOS: 8 days    Leighton Ruff. Redmond Pulling, MD, FACS General, Bariatric, & Minimally Invasive Surgery [phone] [facility]

## 2018-11-11 NOTE — Progress Notes (Signed)
   11/11/18 1834  Vitals  Temp 97.7 F (36.5 C)  Temp Source Oral  BP (!) 182/88  BP Location Left Arm  BP Method Manual  Patient Position (if appropriate) Lying  Pulse Rate (!) 102  Resp 16  MEWS Score  MEWS RR 0  MEWS Pulse 1  MEWS Systolic 0  MEWS LOC 0  MEWS Temp 0  MEWS Score 1  MEWS Score Color Green  5mg  iv lopressor administered fro elevated blood pressure

## 2018-11-11 NOTE — Progress Notes (Signed)
   11/11/18 1747  Vitals  Temp 97.9 F (36.6 C)  Temp Source Oral  BP (!) 195/82  BP Location Right Arm  BP Method Automatic  Patient Position (if appropriate) Sitting  Pulse Rate 96  Resp 18  Oxygen Therapy  SpO2 97 %  O2 Device Room Air  MD text paged as got report that pt had 13 beat run of svt. Waiting for return call

## 2018-11-11 NOTE — Progress Notes (Signed)
ANTICOAGULATION CONSULT NOTE   Pharmacy Consult:  Heparin / Coumadin Indication:  Recurrent VTEs  Allergies  Allergen Reactions  . Codeine Hives and Other (See Comments)    Takes benadryl to stop reaction    Patient Measurements: Height: 6\' 1"  (185.4 cm) Weight: 234 lb 2.1 oz (106.2 kg) IBW/kg (Calculated) : 79.9 Heparin Dosing Weight: 102 kg  Vital Signs: Temp: 98.2 F (36.8 C) (01/25 0358) Temp Source: Oral (01/25 0358) BP: 154/83 (01/25 0358) Pulse Rate: 89 (01/25 0358)  Labs: Recent Labs    11/09/18 0229 11/10/18 0407 11/11/18 0347  HGB 14.0 13.0 12.8*  HCT 42.1 39.7 37.7*  PLT 211 189 180  LABPROT 17.0* 15.9* 15.6*  INR 1.40 1.28 1.25  HEPARINUNFRC 0.42 0.38 0.42  CREATININE 1.21 1.09 1.03    Estimated Creatinine Clearance: 81.7 mL/min (by C-G formula based on SCr of 1.03 mg/dL).   Assessment: 72 YOM on chronic Coumadin for recurrent DVT and PE.  Pharmacy consulted to dose IV heparin as bridge while NPO.  Diet resumed and Coumadin restarted on 11/10/18.  Heparin level is therapeutic and INR is below goal.  No bleeding reported.  Home Coumadin dose: 2mg  PO daily, INR 3.23 on admit  Goal of Therapy:  INR 2-3 Heparin level 0.3-0.7 units/ml Monitor platelets by anticoagulation protocol: Yes   Plan:  Continue heparin gtt at 2400 units/hr  Coumadin 2mg  PO today Daily PT / INR, heparin level and CBC  Guadalupe Nickless D. Laney Potash, PharmD, BCPS, BCCCP 11/11/2018, 1:22 PM

## 2018-11-12 ENCOUNTER — Inpatient Hospital Stay (HOSPITAL_COMMUNITY): Payer: Medicare Other

## 2018-11-12 LAB — RENAL FUNCTION PANEL
Albumin: 3.1 g/dL — ABNORMAL LOW (ref 3.5–5.0)
Anion gap: 10 (ref 5–15)
BUN: 6 mg/dL — ABNORMAL LOW (ref 8–23)
CO2: 22 mmol/L (ref 22–32)
Calcium: 9.1 mg/dL (ref 8.9–10.3)
Chloride: 106 mmol/L (ref 98–111)
Creatinine, Ser: 1.16 mg/dL (ref 0.61–1.24)
GFR calc Af Amer: >60 mL/min (ref >60)
GFR calc non Af Amer: >60 mL/min (ref >60)
Glucose, Bld: 113 mg/dL — ABNORMAL HIGH (ref 70–99)
Phosphorus: 3.3 mg/dL (ref 2.5–4.6)
Potassium: 4 mmol/L (ref 3.5–5.1)
Sodium: 138 mmol/L (ref 135–145)

## 2018-11-12 LAB — CULTURE, BLOOD (ROUTINE X 2)
Culture: NO GROWTH
Culture: NO GROWTH

## 2018-11-12 LAB — CBC
HCT: 39.1 % (ref 39.0–52.0)
Hemoglobin: 12.7 g/dL — ABNORMAL LOW (ref 13.0–17.0)
MCH: 31.4 pg (ref 26.0–34.0)
MCHC: 32.5 g/dL (ref 30.0–36.0)
MCV: 96.8 fL (ref 80.0–100.0)
Platelets: 192 10*3/uL (ref 150–400)
RBC: 4.04 MIL/uL — ABNORMAL LOW (ref 4.22–5.81)
RDW: 14.2 % (ref 11.5–15.5)
WBC: 5.4 10*3/uL (ref 4.0–10.5)
nRBC: 0 % (ref 0.0–0.2)

## 2018-11-12 LAB — PROTIME-INR
INR: 1.27
Prothrombin Time: 15.8 seconds — ABNORMAL HIGH (ref 11.4–15.2)

## 2018-11-12 LAB — HEPARIN LEVEL (UNFRACTIONATED): Heparin Unfractionated: 0.61 IU/mL (ref 0.30–0.70)

## 2018-11-12 LAB — MAGNESIUM: Magnesium: 1.8 mg/dL (ref 1.7–2.4)

## 2018-11-12 MED ORDER — WARFARIN SODIUM 2 MG PO TABS
2.0000 mg | ORAL_TABLET | Freq: Once | ORAL | Status: AC
  Administered 2018-11-12: 2 mg via ORAL
  Filled 2018-11-12: qty 1

## 2018-11-12 MED ORDER — METOCLOPRAMIDE HCL 5 MG/ML IJ SOLN
5.0000 mg | Freq: Four times a day (QID) | INTRAMUSCULAR | Status: DC
  Administered 2018-11-12 – 2018-11-14 (×6): 5 mg via INTRAVENOUS
  Filled 2018-11-12 (×7): qty 2

## 2018-11-12 MED ORDER — FLEET ENEMA 7-19 GM/118ML RE ENEM
1.0000 | ENEMA | Freq: Once | RECTAL | Status: AC
  Administered 2018-11-12: 1 via RECTAL
  Filled 2018-11-12: qty 1

## 2018-11-12 MED ORDER — MAGNESIUM SULFATE IN D5W 1-5 GM/100ML-% IV SOLN
1.0000 g | Freq: Once | INTRAVENOUS | Status: AC
  Administered 2018-11-12: 1 g via INTRAVENOUS
  Filled 2018-11-12: qty 100

## 2018-11-12 NOTE — Progress Notes (Signed)
ANTICOAGULATION CONSULT NOTE   Pharmacy Consult:  Heparin / Coumadin Indication:  Recurrent VTEs  Allergies  Allergen Reactions  . Codeine Hives and Other (See Comments)    Takes benadryl to stop reaction    Patient Measurements: Height: 6\' 1"  (185.4 cm) Weight: 234 lb 2.1 oz (106.2 kg) IBW/kg (Calculated) : 79.9 Heparin Dosing Weight: 102 kg  Vital Signs: Temp: 98.1 F (36.7 C) (01/26 0452) Temp Source: Oral (01/26 0452) BP: 152/83 (01/26 0452) Pulse Rate: 91 (01/26 0452)  Labs: Recent Labs    11/10/18 0407 11/11/18 0347 11/11/18 1818 11/12/18 0446  HGB 13.0 12.8*  --  12.7*  HCT 39.7 37.7*  --  39.1  PLT 189 180  --  192  LABPROT 15.9* 15.6*  --  15.8*  INR 1.28 1.25  --  1.27  HEPARINUNFRC 0.38 0.42  --  0.61  CREATININE 1.09 1.03 1.19 1.16    Estimated Creatinine Clearance: 72.5 mL/min (by C-G formula based on SCr of 1.16 mg/dL).   Assessment: 72 YOM on chronic Coumadin for recurrent DVT and PE.  Pharmacy consulted to dose IV heparin as bridge while NPO.  Diet resumed and Coumadin restarted on 11/10/18.  Heparin level is therapeutic and trending up, INR is below goal; no bleeding reported.  Home Coumadin dose: 2mg  PO daily, INR 3.23 on admit  Goal of Therapy:  INR 2-3 Heparin level 0.3-0.7 units/ml Monitor platelets by anticoagulation protocol: Yes   Plan:  Reduce heparin gtt to 2350 units/hr  Repeat Coumadin 2mg  PO today Daily PT / INR, heparin level and CBC  Rohn Fritsch D. Laney Potash, PharmD, BCPS, BCCCP 11/12/2018, 12:27 PM

## 2018-11-12 NOTE — Evaluation (Signed)
Physical Therapy Evaluation Patient Details Name: Tony Zhang MRN: [id number] DOB: 07-Feb-1946 Today's Date: 11/12/2018   History of Present Illness  Pt is a 73 y.o. male admitted for SBO. PMH consists on HTN, gout, recurrent DVT, remote h/o PE 1980, PVD, AAA, COPD and DJD.    Clinical Impression  PT eval complete. Pt is independent to modified independent with all functional mobility. See below for further details. Pt encouraged to continue hallway ambulation. Pt acknowledged and motivated to comply. Pt reports he is at his baseline for mobility and declines further PT intervention. PT signing off.    Follow Up Recommendations No PT follow up    Equipment Recommendations  None recommended by PT    Recommendations for Other Services       Precautions / Restrictions Precautions Precautions: None      Mobility  Bed Mobility Overal bed mobility: Independent                Transfers Overall transfer level: Independent Equipment used: None                Ambulation/Gait Ambulation/Gait assistance: Modified independent (Device/Increase time) Gait Distance (Feet): 250 Feet Assistive device: IV Pole Gait Pattern/deviations: WFL(Within Functional Limits) Gait velocity: mildly decreased Gait velocity interpretation: 1.31 - 2.62 ft/sec, indicative of limited community ambulator General Gait Details: occassional use of handrail  Stairs            Wheelchair Mobility    Modified Rankin (Stroke Patients Only)       Balance Overall balance assessment: Mild deficits observed, not formally tested                                           Pertinent Vitals/Pain Pain Assessment: No/denies pain    Home Living Family/patient expects to be discharged to:: Private residence Living Arrangements: Alone   Type of Home: Mobile home Home Access: Stairs to enter Entrance Stairs-Rails: Doctor, general practice of Steps: 4 Home Layout:  One level Home Equipment: None Additional Comments: Pt does up stairs sideways due to LLE weakness since back sx.    Prior Function Level of Independence: Independent         Comments: Drives. Grocery shops.     Hand Dominance   Dominant Hand: Right    Extremity/Trunk Assessment   Upper Extremity Assessment Upper Extremity Assessment: Overall WFL for tasks assessed    Lower Extremity Assessment Lower Extremity Assessment: Overall WFL for tasks assessed    Cervical / Trunk Assessment Cervical / Trunk Assessment: Normal  Communication   Communication: No difficulties  Cognition   Behavior During Therapy: WFL for tasks assessed/performed Overall Cognitive Status: Within Functional Limits for tasks assessed                                        General Comments      Exercises     Assessment/Plan    PT Assessment Patent does not need any further PT services  PT Problem List         PT Treatment Interventions      PT Goals (Current goals can be found in the Care Plan section)  Acute Rehab PT Goals Patient Stated Goal: home PT Goal Formulation: All assessment and education complete, DC therapy  Frequency     Barriers to discharge        Co-evaluation               AM-PAC PT "6 Clicks" Mobility  Outcome Measure Help needed turning from your back to your side while in a flat bed without using bedrails?: None Help needed moving from lying on your back to sitting on the side of a flat bed without using bedrails?: None Help needed moving to and from a bed to a chair (including a wheelchair)?: None Help needed standing up from a chair using your arms (e.g., wheelchair or bedside chair)?: None Help needed to walk in hospital room?: None Help needed climbing 3-5 steps with a railing? : A Little 6 Click Score: 23    End of Session   Activity Tolerance: Patient tolerated treatment well Patient left: in chair;with call bell/phone  within reach Nurse Communication: Mobility status PT Visit Diagnosis: Difficulty in walking, not elsewhere classified (R26.2)    Time: [id number] PT Time Calculation (min) (ACUTE ONLY): 16 min   Charges:   PT Evaluation $PT Eval Low Complexity: 1 Low          Aida RaiderWendy Ladesha Pacini, PT  Office # [phone] Pager [phone]   Ilda FoilGarrow, Ravenne Wayment Rene 11/12/2018, 11:56 AM

## 2018-11-12 NOTE — Progress Notes (Signed)
PROGRESS NOTE  Tony MinorsJerry F Blackham YQM:[id number] DOB: 03/05/1946 DOA: 11/03/2018 PCP: [name], [name], MD  HPI/Recap of past 24 hours: Tony Zhang is a 73 y.o. 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 Presents with complaints of 3-day history of nausea and vomiting.  Associated with significant abdominal pain and distention.  CT scan of the abdomen pelvis w contrast positive for small bowel obstruction.  General surgery consulted and following.   Hospital course complicated by dysuria which was present prior to admission.  Started on Rocephin empirically.  Urine culture is pending.   11/06/2018: Patient seen and examined at bedside.  No acute events overnight.  Reported abdominal pain is improving.  Has intermittent nausea with no vomiting.  NG tube in place with persistent output.  General surgery following.  11/07/2018: Patient seen.  NG to low suction in situ.  Surgical input is highly appreciated.  11/08/2018: Patient seen.  No new changes.  Patient tells me that he has some bowel movements after enema yesterday.  Still having significant drainage at the NG tube.  Surgical input is appreciated.  11/09/2018: Still having significant output from the NG tube.  Significant for what sounds is appreciated.  Surgical input is highly appreciated.  CT scan result is also noted.  11/10/2018: Patient seen.  NG tube was discontinued yesterday.  Low potassium and magnesium noted (potassium is 3.6 and magnesium is 1.3 today).  Will replete.  Patient continues to report abdominal bloating.  Has not broken wind in the last 24 hours or so.  We will get an abdominal x-ray.    11/11/2018: Patient seen.  Patient actually looks better today.  Patient has been breaking wind.  Surgical input is appreciated.  Potassium is 3.4 and magnesium is 1.6.  Nursing staff reports  nonsustained V. tach.  Will optimize a electrolytes.  Potassium and magnesium are reviewed replete.  We will repeat renal panel as well as magnesium.  We will pursue an echocardiogram (no prior echocardiogram noted).  Patient may have undiagnosed OSA.  Will encourage formal sleep study on discharge, if not already done.  11/12/2018: Patient continues to report abdominal distention.:  Surgical input is appreciated.  Assessment/Plan: Principal Problem:   Small bowel obstruction (HCC) Active Problems:   AAA (abdominal aortic aneurysm) (HCC)   Chronic distal aortic occlusion (HCC)   DVT, lower extremity and pulmonary embolism, recurrent   Hyperlipidemia   COPD (chronic obstructive pulmonary disease) (HCC)   OSA (obstructive sleep apnea)   Peripheral vascular disease (HCC)   Anticoagulated on Coumadin  Sepsis secondary to small bowel obstruction versus UTI, both present on admission Presented with abdominal pain, distention, leukocytosis with WBC 21,000, heart rate of 111 and tachypnea with respiratory rate of 29. CT abdomen and pelvis with contrast done on 11/03/2018 revealed findings consistent with early complete or partial small bowel obstruction with marked distention of the stomach and jejunum. Self reports prior abdominal surgeries from cholecystectomy and hernia repair  Now positive UA with dysuria and polyuria Continue Rocephin daily, day #2 Sepsis criteria is resolving  Urine culture positive greater than 100,000 colonies of staph epidermidis Continue n.p.o. Continue to monitor output from NG tube suction Continue IV fluids, IV Zofran, pain management with IV fentanyl as needed 11/07/2018: No sepsis syndrome/physiology elicited.  Continue management for small bowel obstruction. 11/10/2018: We will repeat abdominal x-ray.  Further management will depend on above.  Patient is also encouraged to ambulate. 11/11/2018: GI symptoms seem to be improving.  Surgical input appreciated.  Likely  follow with GI also during discharge, as well as a primary care provider. 26 2020: Repeat abdominal KUB.  UTI, present on admission Management as stated above 11/07/2018: Urine culture grew staph epidermidis.  Will repeat urinalysis and urine culture.  Will obtain blood culture.  Will discontinue Rocephin.  Possible CKD 2 versus AKI:  11/07/2018: Noted prior documentation of AKI on CKD 3  Information visualized so far will suggest antibiotics possible chronic kidney disease stage II.   Continue to monitor. 11/10/2018: Care has resolved.  Hypomagnesemia: Magnesium was 1.3 on presentation. Repleted. Continue to monitor. 11/09/2018: Magnesium as of 11/04/2018 was 2.4.  Continue to monitor. 11/10/2018: Magnesium is 1.3 today.  We will give supplemental magnesium. 11/12/2018: Continue to monitor and replete.  Hypokalemia Continue to monitor potassium. Renal panel in a.m. 11/09/2018: Potassium today is 3.4.  Continue to monitor and replete. 11/10/2018: Potassium is 3.6 today.  Will replete. 11/12/2018: Continue to monitor and replete.  Recurrent DVT and PE. Patient is on chronic anticoagulation with warfarin. Coumadin is on hold INR 1.86 on 11/06/2018 Heparin drip until cleared from general surgery Pharmacy is managing heparin and Coumadin  11/10/2018: Pharmacy consulted to transition from heparin drip to Coumadin as no surgery is planned.  Chronic distal aortic occlusion Diagnosed 5 years ago Seen again on CT abdomen and pelvis with contrast on admission  History of upper GI bleed Self-reported had an endoscopy in the past and treated for upper GI bleed Hemoglobin is stable No sign of overt bleeding  Peripheral vascular disease. Patient is on Pletal at home. Currently on hold as patient is n.p.o. Monitor.  Bilateral renal artery stenosis Diagnosed 5 years ago  History of COPD. Continue inhalers. Does not appear to be having any exacerbation right now.  Body mass index is  31 kg/m.  Obesity.  Chronic pain syndrome - [facility] database was reviewed. - Last prescription for controlled substance was on December 2019 for Norco. -Continue here in the hospital once patient is able to take p.o.  Risk: High risk for decompensation due to ongoing small bowel obstruction, severe dyselectrolytemia, multiple comorbidities, and advanced age.  Nutrition: NPO DVT Prophylaxis: Heparin drip  Advance goals of care discussion: Full code   Consults: General surgery   Family Communication: no family was present at bedside, at the time of interview.  Disposition: To depend on hospital course.  Objective: Vitals:   11/11/18 1834 11/11/18 1937 11/11/18 2135 11/12/18 0452  BP: (!) 182/88 (!) 149/81 (!) 148/81 (!) 152/83  Pulse: (!) 102 83 82 91  Resp: 16 18 18 19   Temp: 97.7 F (36.5 C) 97.9 F (36.6 C) 98.5 F (36.9 C) 98.1 F (36.7 C)  TempSrc: Oral Oral Oral Oral  SpO2:  96% 96% 98%  Weight:      Height:        Intake/Output Summary (Last 24 hours) at 11/12/2018 1107 Last data filed at 11/12/2018 0926 Gross per 24 hour  Intake 1633 ml  Output 1650 ml  Net -17 ml   Filed Weights   11/03/18 1350 11/03/18 2043  Weight: 106.6 kg 106.2 kg    Exam:  . General: Obese.  Not in any distress.  NG tube to low suction in situ.   . Cardiovascular: Regular rate and rhythm. [name]. Respiratory: Clear to Auscultation.   Abdomen: Abdomen is obese, soft and nontender.  Bowel sounds are normoactive.    Data Reviewed: CBC: Recent Labs  Lab 11/07/18 0503 11/09/18 0229 11/10/18 0407 11/11/18 0347 11/12/18 0446  WBC 7.8 6.4 6.6 6.1 5.4  NEUTROABS  --   --   --  4.5  --   HGB 15.0 14.0 13.0 12.8* 12.7*  HCT 44.6 42.1 39.7 37.7* 39.1  MCV 96.1 95.5 95.7 95.2 96.8  PLT 190 211 189 180 192   Basic Metabolic Panel: Recent Labs  Lab 11/09/18 0229 11/10/18 0407 11/11/18 0347 11/11/18 1818 11/12/18 0446  NA 140 137  137 136 138  K 3.4* 3.6 3.4* 3.9 4.0  CL 109 107 106 103 106  CO2 22 22 21* 20* 22  GLUCOSE 105* 97 127* 113* 113*  BUN 12 10 9 9  6*  CREATININE 1.21 1.09 1.03 1.19 1.16  CALCIUM 9.0 8.8* 8.7* 9.1 9.1  MG  --  1.3* 1.6* 1.5* 1.8  PHOS  --  3.5 3.3 3.4 3.3   GFR: Estimated Creatinine Clearance: 72.5 mL/min (by C-G formula based on SCr of 1.16 mg/dL). Liver Function Tests: Recent Labs  Lab 11/10/18 0407 11/11/18 0347 11/11/18 1818 11/12/18 0446  ALBUMIN 3.0* 2.9* 3.4* 3.1*   No results for input(s): LIPASE, AMYLASE in the last 168 hours. No results for input(s): AMMONIA in the last 168 hours. Coagulation Profile: Recent Labs  Lab 11/08/18 0259 11/09/18 0229 11/10/18 0407 11/11/18 0347 11/12/18 0446  INR 1.47 1.40 1.28 1.25 1.27   Cardiac Enzymes: No results for input(s): CKTOTAL, CKMB, CKMBINDEX, TROPONINI in the last 168 hours. BNP (last 3 results) No results for input(s): PROBNP in the last 8760 hours. HbA1C: No results for input(s): HGBA1C in the last 72 hours. CBG: No results for input(s): GLUCAP in the last 168 hours. Lipid Profile: No results for input(s): CHOL, HDL, LDLCALC, TRIG, CHOLHDL, LDLDIRECT in the last 72 hours. Thyroid Function Tests: No results for input(s): TSH, T4TOTAL, FREET4, T3FREE, THYROIDAB in the last 72 hours. Anemia Panel: No results for input(s): VITAMINB12, FOLATE, FERRITIN, TIBC, IRON, RETICCTPCT in the last 72 hours. Urine analysis:    Component Value Date/Time   COLORURINE AMBER (A) 11/09/2018 1250   APPEARANCEUR CLEAR 11/09/2018 1250   LABSPEC 1.015 11/09/2018 1250   PHURINE 6.0 11/09/2018 1250   GLUCOSEU NEGATIVE 11/09/2018 1250   HGBUR SMALL (A) 11/09/2018 1250   BILIRUBINUR NEGATIVE 11/09/2018 1250   KETONESUR NEGATIVE 11/09/2018 1250   PROTEINUR NEGATIVE 11/09/2018 1250   UROBILINOGEN 0.2 04/08/2015 2033   NITRITE NEGATIVE 11/09/2018 1250   LEUKOCYTESUR NEGATIVE 11/09/2018 1250   Sepsis  Labs: @LABRCNTIP (procalcitonin:4,lacticidven:4)  ) Recent Results (from the past 240 hour(s))  Culture, Urine     Status: Abnormal   Collection Time: 11/05/18  9:22 AM  Result Value Ref Range Status   Specimen Description URINE, CLEAN CATCH  Final   Special Requests   Final    Normal Performed at [facility], [address] [place], [place] [address]    Culture >=100,000 COLONIES/mL STAPHYLOCOCCUS EPIDERMIDIS (A)  Final   Report Status 11/07/2018 FINAL  Final   Organism ID, Bacteria STAPHYLOCOCCUS EPIDERMIDIS (A)  Final      Susceptibility   Staphylococcus epidermidis - MIC*    CIPROFLOXACIN >=8 RESISTANT Resistant     GENTAMICIN <=0.5 SENSITIVE Sensitive     NITROFURANTOIN <=16 SENSITIVE Sensitive     OXACILLIN <=0.25 SENSITIVE Sensitive     TETRACYCLINE 4 SENSITIVE Sensitive     VANCOMYCIN 1 SENSITIVE Sensitive  TRIMETH/SULFA <=10 SENSITIVE Sensitive     CLINDAMYCIN <=0.25 SENSITIVE Sensitive     RIFAMPIN <=0.5 SENSITIVE Sensitive     Inducible Clindamycin NEGATIVE Sensitive     * >=100,000 COLONIES/mL STAPHYLOCOCCUS EPIDERMIDIS  Culture, Urine     Status: Abnormal   Collection Time: 11/07/18  6:05 PM  Result Value Ref Range Status   Specimen Description URINE, CLEAN CATCH  Final   Special Requests NONE  Final   Culture (A)  Final    <10,000 COLONIES/mL INSIGNIFICANT GROWTH Performed at [facility], [address] [place], [place] [address]    Report Status 11/09/2018 FINAL  Final  Culture, blood (routine x 2)     Status: None   Collection Time: 11/07/18  6:36 PM  Result Value Ref Range Status   Specimen Description BLOOD RIGHT ANTECUBITAL  Final   Special Requests   Final    BOTTLES DRAWN AEROBIC AND ANAEROBIC Blood Culture results may not be optimal due to an excessive volume of blood received in culture bottles   Culture   Final    NO GROWTH 5 DAYS Performed at [facility], [address], [place], [place] [address]    Report Status  11/12/2018 FINAL  Final  Culture, blood (routine x 2)     Status: None   Collection Time: 11/07/18  6:36 PM  Result Value Ref Range Status   Specimen Description BLOOD LEFT HAND  Final   Special Requests   Final    BOTTLES DRAWN AEROBIC AND ANAEROBIC Blood Culture results may not be optimal due to an excessive volume of blood received in culture bottles   Culture   Final    NO GROWTH 5 DAYS Performed at [facility], [address], [place], [place] [address]    Report Status 11/12/2018 FINAL  Final  Culture, Urine     Status: None   Collection Time: 11/09/18  6:00 PM  Result Value Ref Range Status   Specimen Description URINE, CLEAN CATCH  Final   Special Requests   Final    NONE Performed at [facility], [address] [place], [place] [address]    Culture NO GROWTH  Final   Report Status 11/11/2018 FINAL  Final      Studies: No results found.  Scheduled Meds: . docusate sodium  100 mg Oral BID  . feeding supplement  1 Container Oral TID BM  . mouth rinse  15 mL Mouth Rinse BID  . multivitamin with minerals  1 tablet Oral Daily  . polyethylene glycol  17 g Oral Daily  . sodium chloride flush  3 mL Intravenous Q12H  . Warfarin - Pharmacist Dosing Inpatient   Does not apply q1800    Continuous Infusions: . dextrose 5% lactated ringers with KCl 20 mEq/L 50 mL/hr at 11/12/18 0254  . heparin 2,400 Units/hr (11/12/18 0442)  . magnesium sulfate 1 - 4 g bolus IVPB    . sodium chloride       LOS: 9 days     Barnetta Chapel, MD [facility] Pager [phone] If 7PM-7AM, please contact night-coverage [email or web address] Password [facility] 11/12/2018, 11:07 AM

## 2018-11-12 NOTE — Progress Notes (Signed)
Patient ID: Tony Zhang, male   DOB: 21-Sep-1946, 73 y.o.   MRN: [id number]   Acute Care Surgery Service Progress Note:    Chief Complaint/Subjective: No n/v +Flatus No bm C/o burning with urination Ate most of dinner on tray last night Still with some abd discomfort at times  Objective: Vital signs in last 24 hours: Temp:  [97.5 F (36.4 C)-98.5 F (36.9 C)] 98.1 F (36.7 C) (01/26 0452) Pulse Rate:  [82-102] 91 (01/26 0452) Resp:  [16-19] 19 (01/26 0452) BP: (148-195)/(78-88) 152/83 (01/26 0452) SpO2:  [96 %-98 %] 98 % (01/26 0452) Last BM Date: 11/08/18  Intake/Output from previous day: 01/25 0701 - 01/26 0700 In: 1117 [P.O.:300; I.V.:817] Out: 1925 [Urine:1925] Intake/Output this shift: No intake/output data recorded.  Lungs: cta, nonlabored  Cardiovascular: reg  Abd: obese, soft, min TTP  Extremities: no edema, +SCDs  Neuro: alert, nonfocal  Lab Results: CBC  Recent Labs    11/11/18 0347 11/12/18 0446  WBC 6.1 5.4  HGB 12.8* 12.7*  HCT 37.7* 39.1  PLT 180 192   BMET Recent Labs    11/11/18 1818 11/12/18 0446  NA 136 138  K 3.9 4.0  CL 103 106  CO2 20* 22  GLUCOSE 113* 113*  BUN 9 6*  CREATININE 1.19 1.16  CALCIUM 9.1 9.1   LFT Hepatic Function Latest Ref Rng & Units 11/12/2018 11/11/2018 11/11/2018  Total Protein 6.5 - 8.1 g/dL - - -  Albumin 3.5 - 5.0 g/dL 3.1(L) 3.4(L) 2.9(L)  AST 15 - 41 U/L - - -  ALT 0 - 44 U/L - - -  Alk Phosphatase 38 - 126 U/L - - -  Total Bilirubin 0.3 - 1.2 mg/dL - - -   PT/INR Recent Labs    11/11/18 0347 11/12/18 0446  LABPROT 15.6* 15.8*  INR 1.25 1.27   ABG No results for input(s): PHART, HCO3 in the last 72 hours.  Invalid input(s): PCO2, PO2  Studies/Results:  Anti-infectives: Anti-infectives (From admission, onward)   Start     Dose/Rate Route Frequency Ordered Stop   11/05/18 1230  cefTRIAXone (ROCEPHIN) 1 g in sodium chloride 0.9 % 100 mL IVPB  Status:  Discontinued     1 g 200 mL/hr  over 30 Minutes Intravenous Every 24 hours 11/05/18 1202 11/07/18 1805      Medications: Scheduled Meds: . docusate sodium  100 mg Oral BID  . feeding supplement  1 Container Oral TID BM  . mouth rinse  15 mL Mouth Rinse BID  . multivitamin with minerals  1 tablet Oral Daily  . polyethylene glycol  17 g Oral Daily  . sodium chloride flush  3 mL Intravenous Q12H  . sodium phosphate  1 enema Rectal Once  . Warfarin - Pharmacist Dosing Inpatient   Does not apply q1800   Continuous Infusions: . dextrose 5% lactated ringers with KCl 20 mEq/L 50 mL/hr at 11/12/18 0254  . heparin 2,400 Units/hr (11/12/18 0442)  . sodium chloride     PRN Meds:.acetaminophen **OR** acetaminophen, diphenhydrAMINE, fentaNYL (SUBLIMAZE) injection, hydrALAZINE, metoprolol tartrate, ondansetron **OR** ondansetron (ZOFRAN) IV, phenol, promethazine  Assessment/Plan: Patient Active Problem List   Diagnosis Date Noted  . Small bowel obstruction ([place]) 11/03/2018  . History of pulmonary embolism 02/09/2016  . Acute GI bleeding   . Anemia due to blood loss, acute   . GI bleed 04/08/2015  . GIB (gastrointestinal bleeding) 04/08/2015  . AKI (acute kidney injury) ([place]) 04/08/2015  . Empyema lung ([place])   .  Loculated pleural effusion   . Pneumothorax   . Empyema, right ([place]) 11/25/2014  . Persistent right pleural effusion and right lower lobe infiltrate 11/14/2014  . HCAP (healthcare-associated pneumonia) 10/10/2014  . Peripheral vascular disease ([place]) 10/10/2014  . Anticoagulated on Coumadin 10/10/2014  . Supratherapeutic INR 10/10/2014  . Microcytic anemia 09/18/2013  . CAP (community acquired pneumonia) 09/09/2013  . AAA (abdominal aortic aneurysm) ([place]) 08/19/2013  . 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 Malignant HTN with heart disease, w/o CHF, with chronic kidney disease 08/19/2013  . DVT, lower extremity and pulmonary embolism, recurrent 08/19/2013  . Hyperlipidemia 08/19/2013  . Gout  08/19/2013  . COPD (chronic obstructive pulmonary disease) ([place]) 08/19/2013  . OSA (obstructive sleep apnea) 08/19/2013  . Abnormal nuclear stress test 08/19/2013  . Bilateral renal artery stenosis ([place]) 08/19/2013  . DVT (deep venous thrombosis) ([place]) 06/28/2012   UTI - per medicine, still complaining of dysuria, resend urine culture AKI on CKD stage III- Cr 1.09 Recurrent DVT and PE - on heparin gtt, INR1.40 Chronic distal aortic occlusion  Hx of UGI bleed PVD Bilateral renal artery stenosis COPD Chronic pain syndrome Obesity Dysuria - Ucx pending  SBO - SBO protocol completed and contrast noted in his colon on delayed films -repeat CT scan 1/23 with no signs of SBO, contrast in colon - NG tube removed 1/23 - Continue  soft diet. cont bowel regimen with Miralax/colace. Monitor electrolytes and recommend keeping K>4 and Mag>2. Continue ambulating. No role for surgical intervention. Ok to restart coumadin.  FEN -soft diet as tolerated, Boost VTE -heparin gtt ID -rocephin1/19>>1/21  Disposition: his initial films were not terribly impressive for SBO. Most recent films show no bowel dilation. Discussed with pt it may take time for his intestines to 'normalize' and his appetite will be off. Encourage protein shakes if not ready for solids.   Pt would probably benefit from outpt GI evaluation to consider other pathology causing his bloating/discomfort - IBS?  Will give enema today  LOS: 9 days    Leighton Ruff. Redmond Pulling, MD, FACS General, Bariatric, & Minimally Invasive Surgery [phone] [facility]

## 2018-11-12 NOTE — Plan of Care (Signed)
  Problem: Education: Goal: Knowledge of General Education information will improve Description Including pain rating scale, medication(s)/side effects and non-pharmacologic comfort measures Outcome: Progressing   Problem: Clinical Measurements: Goal: Ability to maintain clinical measurements within normal limits will improve Outcome: Progressing   Problem: Clinical Measurements: Goal: Diagnostic test results will improve Outcome: Progressing   Problem: Nutrition: Goal: Adequate nutrition will be maintained Outcome: Progressing   Problem: Pain Managment: Goal: General experience of comfort will improve Outcome: Progressing

## 2018-11-13 ENCOUNTER — Inpatient Hospital Stay (HOSPITAL_COMMUNITY): Payer: Medicare Other

## 2018-11-13 DIAGNOSIS — I472 Ventricular tachycardia: Secondary | ICD-10-CM

## 2018-11-13 LAB — RENAL FUNCTION PANEL
Albumin: 3.2 g/dL — ABNORMAL LOW (ref 3.5–5.0)
Anion gap: 9 (ref 5–15)
BUN: 9 mg/dL (ref 8–23)
CO2: 19 mmol/L — ABNORMAL LOW (ref 22–32)
Calcium: 9.3 mg/dL (ref 8.9–10.3)
Chloride: 108 mmol/L (ref 98–111)
Creatinine, Ser: 1.22 mg/dL (ref 0.61–1.24)
GFR calc Af Amer: >60 mL/min (ref >60)
GFR calc non Af Amer: 58 mL/min — ABNORMAL LOW (ref >60)
Glucose, Bld: 111 mg/dL — ABNORMAL HIGH (ref 70–99)
Phosphorus: 3.7 mg/dL (ref 2.5–4.6)
Potassium: 4 mmol/L (ref 3.5–5.1)
Sodium: 136 mmol/L (ref 135–145)

## 2018-11-13 LAB — PROTIME-INR
INR: 1.13
Prothrombin Time: 14.4 seconds (ref 11.4–15.2)

## 2018-11-13 LAB — URINALYSIS, ROUTINE W REFLEX MICROSCOPIC
Bilirubin Urine: NEGATIVE
Glucose, UA: NEGATIVE mg/dL
Hgb urine dipstick: NEGATIVE
Ketones, ur: NEGATIVE mg/dL
Leukocytes, UA: NEGATIVE
Nitrite: NEGATIVE
Protein, ur: NEGATIVE mg/dL
Specific Gravity, Urine: 1.01 (ref 1.005–1.030)
pH: 7 (ref 5.0–8.0)

## 2018-11-13 LAB — CBC
HCT: 40 % (ref 39.0–52.0)
Hemoglobin: 13.2 g/dL (ref 13.0–17.0)
MCH: 32 pg (ref 26.0–34.0)
MCHC: 33 g/dL (ref 30.0–36.0)
MCV: 97.1 fL (ref 80.0–100.0)
Platelets: 198 10*3/uL (ref 150–400)
RBC: 4.12 MIL/uL — ABNORMAL LOW (ref 4.22–5.81)
RDW: 14.3 % (ref 11.5–15.5)
WBC: 5.8 10*3/uL (ref 4.0–10.5)
nRBC: 0 % (ref 0.0–0.2)

## 2018-11-13 LAB — HEPARIN LEVEL (UNFRACTIONATED): Heparin Unfractionated: 0.33 IU/mL (ref 0.30–0.70)

## 2018-11-13 LAB — ECHOCARDIOGRAM COMPLETE
Height: 73 in
Weight: 3746.06 oz

## 2018-11-13 LAB — MAGNESIUM: Magnesium: 1.6 mg/dL — ABNORMAL LOW (ref 1.7–2.4)

## 2018-11-13 MED ORDER — WARFARIN SODIUM 4 MG PO TABS
4.0000 mg | ORAL_TABLET | Freq: Once | ORAL | Status: AC
  Administered 2018-11-13: 4 mg via ORAL
  Filled 2018-11-13: qty 1

## 2018-11-13 MED ORDER — TAMSULOSIN HCL 0.4 MG PO CAPS
0.4000 mg | ORAL_CAPSULE | Freq: Every day | ORAL | Status: DC
  Administered 2018-11-13 – 2018-11-14 (×2): 0.4 mg via ORAL
  Filled 2018-11-13 (×2): qty 1

## 2018-11-13 NOTE — Progress Notes (Signed)
  Echocardiogram 2D Echocardiogram has been performed.  Tony Zhang G Tatsuo Musial 11/13/2018, 11:57 AM

## 2018-11-13 NOTE — Progress Notes (Signed)
ANTICOAGULATION CONSULT NOTE   Pharmacy Consult:  Heparin / Coumadin Indication:  Recurrent VTEs  Allergies  Allergen Reactions  . Codeine Hives and Other (See Comments)    Takes benadryl to stop reaction    Patient Measurements: Height: 6\' 1"  (185.4 cm) Weight: 234 lb 2.1 oz (106.2 kg) IBW/kg (Calculated) : 79.9 Heparin Dosing Weight: 102 kg  Vital Signs: Temp: 98.1 F (36.7 C) (01/27 0450) Temp Source: Oral (01/27 0450) BP: 158/86 (01/27 0450) Pulse Rate: 89 (01/27 0450)  Labs: Recent Labs    11/11/18 0347 11/11/18 1818 11/12/18 0446 11/13/18 0208  HGB 12.8*  --  12.7* 13.2  HCT 37.7*  --  39.1 40.0  PLT 180  --  192 198  LABPROT 15.6*  --  15.8* 14.4  INR 1.25  --  1.27 1.13  HEPARINUNFRC 0.42  --  0.61 0.33  CREATININE 1.03 1.19 1.16 1.22    Estimated Creatinine Clearance: 69 mL/min (by C-G formula based on SCr of 1.22 mg/dL).   Assessment: 72 YOM on chronic Coumadin for recurrent DVT and PE.  Pharmacy consulted to dose IV heparin as bridge while NPO.  Diet resumed and Coumadin restarted on 11/10/18.  Heparin level is therapeutic, INR remains below goal at 1.13; CBC stable, no bleeding documented.  Home Coumadin dose: 2mg  PO daily, INR 3.23 on admit  Goal of Therapy:  INR 2-3 Heparin level 0.3-0.7 units/ml Monitor platelets by anticoagulation protocol: Yes   Plan:  Continue heparin gtt at 2350 units/hr while subtherapeutic Coumadin 4mg  PO x 1 dose tonight Monitor daily heparin level/INR/CBC, s/sx bleeding  Babs Bertin, PharmD, BCPS Please check AMION for all [facility] contact numbers Clinical Pharmacist 11/13/2018 1:34 PM

## 2018-11-13 NOTE — Progress Notes (Signed)
PROGRESS NOTE  Tony Zhang ZOX:[id number] DOB: [birth year] DOA: 11/03/2018 PCP: [name], MD  HPI/Recap of past 24 hours: Tony Zhang is a 73 y.o. 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 Presents with complaints of 3-day history of nausea and vomiting.  Associated with significant abdominal pain and distention.  CT scan of the abdomen pelvis w contrast positive for small bowel obstruction.  General surgery consulted and following.   Hospital course complicated by dysuria which was present prior to admission.  Started on Rocephin empirically.  Urine culture is pending.   11/06/2018: Patient seen and examined at bedside.  No acute events overnight.  Reported abdominal pain is improving.  Has intermittent nausea with no vomiting.  NG tube in place with persistent output.  General surgery following.  11/07/2018: Patient seen.  NG to low suction in situ.  Surgical input is highly appreciated.  11/08/2018: Patient seen.  No new changes.  Patient tells me that he has some bowel movements after enema yesterday.  Still having significant drainage at the NG tube.  Surgical input is appreciated.  11/09/2018: Still having significant output from the NG tube.  Significant for what sounds is appreciated.  Surgical input is highly appreciated.  CT scan result is also noted.  11/10/2018: Patient seen.  NG tube was discontinued yesterday.  Low potassium and magnesium noted (potassium is 3.6 and magnesium is 1.3 today).  Will replete.  Patient continues to report abdominal bloating.  Has not broken wind in the last 24 hours or so.  We will get an abdominal x-ray.    11/11/2018: Patient seen.  Patient actually looks better today.  Patient has been breaking wind.  Surgical input is appreciated.  Potassium is 3.4 and magnesium is 1.6.  Nursing staff reports  nonsustained V. tach.  Will optimize a electrolytes.  Potassium and magnesium are reviewed replete.  We will repeat renal panel as well as magnesium.  We will pursue an echocardiogram (no prior echocardiogram noted).  Patient may have undiagnosed OSA.  Will encourage formal sleep study on discharge, if not already done.  11/12/2018: Patient continues to report abdominal distention.:  Surgical input is appreciated. 11/13/2018: Gaseous distention of the abdomen improved significantly with IV Reglan.  INR is 1.13 today.  Patient will be discharged back home once INR is optimized.  Assessment/Plan: Principal Problem:   Small bowel obstruction (HCC) Active Problems:   AAA (abdominal aortic aneurysm) (HCC)   Chronic distal aortic occlusion (HCC)   DVT, lower extremity and pulmonary embolism, recurrent   Hyperlipidemia   COPD (chronic obstructive pulmonary disease) (HCC)   OSA (obstructive sleep apnea)   Peripheral vascular disease (HCC)   Anticoagulated on Coumadin  Sepsis secondary to small bowel obstruction versus UTI, both present on admission Presented with abdominal pain, distention, leukocytosis with WBC 21,000, heart rate of 111 and tachypnea with respiratory rate of 29. CT abdomen and pelvis with contrast done on 11/03/2018 revealed findings consistent with early complete or partial small bowel obstruction with marked distention of the stomach and jejunum. Self reports prior abdominal surgeries from cholecystectomy and hernia repair  Now positive UA with dysuria and polyuria Continue Rocephin daily, day #2 Sepsis criteria is resolving  Urine culture positive greater than 100,000 colonies of staph epidermidis Continue n.p.o. Continue to monitor output from NG tube suction Continue IV fluids, IV Zofran, pain management with IV fentanyl as  needed 11/07/2018: No sepsis syndrome/physiology elicited.  Continue management for small bowel obstruction. 11/10/2018: We will repeat abdominal x-ray.   Further management will depend on above.  Patient is also encouraged to ambulate. 11/11/2018: GI symptoms seem to be improving.  Surgical input appreciated.  Likely follow with GI also during discharge, as well as a primary care provider. 11/12/2018: Repeat abdominal KUB. 11/13/2018: 11/13/2018: Gaseous distention of the abdomen improved significantly with IV Reglan.  INR is 1.13 today.  Patient will be discharged back home once INR is optimized.   UTI, present on admission Management as stated above 11/07/2018: Urine culture grew staph epidermidis.  Will repeat urinalysis and urine culture.  Will obtain blood culture.  Will discontinue Rocephin.  Possible CKD 2 versus AKI:  11/07/2018: Noted prior documentation of AKI on CKD 3  Information visualized so far will suggest antibiotics possible chronic kidney disease stage II.   Continue to monitor. 11/10/2018: Care has resolved.  Hypomagnesemia: Magnesium was 1.3 on presentation. Repleted. Continue to monitor. 11/09/2018: Magnesium as of 11/04/2018 was 2.4.  Continue to monitor. 11/10/2018: Magnesium is 1.3 today.  We will give supplemental magnesium. 11/12/2018: Continue to monitor and replete.  Hypokalemia Continue to monitor potassium. Renal panel in a.m. 11/09/2018: Potassium today is 3.4.  Continue to monitor and replete. 11/10/2018: Potassium is 3.6 today.  Will replete. 11/12/2018: Continue to monitor and replete.  Recurrent DVT and PE. Patient is on chronic anticoagulation with warfarin. Coumadin is on hold INR 1.86 on 11/06/2018 Heparin drip until cleared from general surgery Pharmacy is managing heparin and Coumadin  11/10/2018: Pharmacy consulted to transition from heparin drip to Coumadin as no surgery is planned.  Chronic distal aortic occlusion Diagnosed 5 years ago Seen again on CT abdomen and pelvis with contrast on admission  History of upper GI bleed Self-reported had an endoscopy in the past and treated for upper GI  bleed Hemoglobin is stable No sign of overt bleeding  Peripheral vascular disease. Patient is on Pletal at home. Currently on hold as patient is n.p.o. Monitor.  Bilateral renal artery stenosis Diagnosed 5 years ago  History of COPD. Continue inhalers. Does not appear to be having any exacerbation right now.  Body mass index is 31 kg/m.  Obesity.  Chronic pain syndrome - [facility] Controlled Substance Reporting System database was reviewed. - Last prescription for controlled substance was on December 2019 for Norco. -Continue here in the hospital once patient is able to take p.o.  Risk: High risk for decompensation due to ongoing small bowel obstruction, severe dyselectrolytemia, multiple comorbidities, and advanced age.  Nutrition: NPO DVT Prophylaxis: Heparin drip  Advance goals of care discussion: Full code   Consults: General surgery   Family Communication: no family was present at bedside, at the time of interview.  Disposition: To depend on hospital course.  Objective: Vitals:   11/12/18 2238 11/13/18 0450 11/13/18 1808 11/13/18 2026  BP: 137/83 (!) 158/86 (!) 150/77 (!) 160/83  Pulse: (!) 102 89 (!) 103 (!) 104  Resp: 19 18 18 18   Temp: 98.4 F (36.9 C) 98.1 F (36.7 C) 98.7 F (37.1 C) 99.1 F (37.3 C)  TempSrc: Oral Oral Oral Oral  SpO2: 96% 97% 98% 96%  Weight:      Height:        Intake/Output Summary (Last 24 hours) at 11/13/2018 2131 Last data filed at 11/13/2018 1942 Gross per 24 hour  Intake 2676 ml  Output 675 ml  Net 2001 ml   Filed  Weights   11/03/18 1350 11/03/18 2043  Weight: 106.6 kg 106.2 kg    Exam:  . General: Obese.  Not in any distress.  NG tube to low suction in situ.   . Cardiovascular: Regular rate and rhythm. [name]. Respiratory: Clear to Auscultation.   Abdomen: Abdomen is obese, soft and nontender.  Bowel sounds are normoactive.    Data Reviewed: CBC: Recent Labs  Lab 11/09/18 0229 11/10/18 0407  11/11/18 0347 11/12/18 0446 11/13/18 0208  WBC 6.4 6.6 6.1 5.4 5.8  NEUTROABS  --   --  4.5  --   --   HGB 14.0 13.0 12.8* 12.7* 13.2  HCT 42.1 39.7 37.7* 39.1 40.0  MCV 95.5 95.7 95.2 96.8 97.1  PLT 211 189 180 192 198   Basic Metabolic Panel: Recent Labs  Lab 11/10/18 0407 11/11/18 0347 11/11/18 1818 11/12/18 0446 11/13/18 0208  NA 137 137 136 138 136  K 3.6 3.4* 3.9 4.0 4.0  CL 107 106 103 106 108  CO2 22 21* 20* 22 19*  GLUCOSE 97 127* 113* 113* 111*  BUN 10 9 9  6* 9  CREATININE 1.09 1.03 1.19 1.16 1.22  CALCIUM 8.8* 8.7* 9.1 9.1 9.3  MG 1.3* 1.6* 1.5* 1.8 1.6*  PHOS 3.5 3.3 3.4 3.3 3.7   GFR: Estimated Creatinine Clearance: 69 mL/min (by C-G formula based on SCr of 1.22 mg/dL). Liver Function Tests: Recent Labs  Lab 11/10/18 0407 11/11/18 0347 11/11/18 1818 11/12/18 0446 11/13/18 0208  ALBUMIN 3.0* 2.9* 3.4* 3.1* 3.2*   No results for input(s): LIPASE, AMYLASE in the last 168 hours. No results for input(s): AMMONIA in the last 168 hours. Coagulation Profile: Recent Labs  Lab 11/09/18 0229 11/10/18 0407 11/11/18 0347 11/12/18 0446 11/13/18 0208  INR 1.40 1.28 1.25 1.27 1.13   Cardiac Enzymes: No results for input(s): CKTOTAL, CKMB, CKMBINDEX, TROPONINI in the last 168 hours. BNP (last 3 results) No results for input(s): PROBNP in the last 8760 hours. HbA1C: No results for input(s): HGBA1C in the last 72 hours. CBG: No results for input(s): GLUCAP in the last 168 hours. Lipid Profile: No results for input(s): CHOL, HDL, LDLCALC, TRIG, CHOLHDL, LDLDIRECT in the last 72 hours. Thyroid Function Tests: No results for input(s): TSH, T4TOTAL, FREET4, T3FREE, THYROIDAB in the last 72 hours. Anemia Panel: No results for input(s): VITAMINB12, FOLATE, FERRITIN, TIBC, IRON, RETICCTPCT in the last 72 hours. Urine analysis:    Component Value Date/Time   COLORURINE YELLOW 11/13/2018 1824   APPEARANCEUR CLEAR 11/13/2018 1824   LABSPEC 1.010 11/13/2018  1824   PHURINE 7.0 11/13/2018 1824   GLUCOSEU NEGATIVE 11/13/2018 1824   HGBUR NEGATIVE 11/13/2018 1824   BILIRUBINUR NEGATIVE 11/13/2018 1824   KETONESUR NEGATIVE 11/13/2018 1824   PROTEINUR NEGATIVE 11/13/2018 1824   UROBILINOGEN 0.2 04/08/2015 2033   NITRITE NEGATIVE 11/13/2018 1824   LEUKOCYTESUR NEGATIVE 11/13/2018 1824   Sepsis Labs: @LABRCNTIP (procalcitonin:4,lacticidven:4)  ) Recent Results (from the past 240 hour(s))  Culture, Urine     Status: Abnormal   Collection Time: 11/05/18  9:22 AM  Result Value Ref Range Status   Specimen Description URINE, CLEAN CATCH  Final   Special Requests   Final    Normal Performed at [facility], [address] [place], [place] [address]    Culture >=100,000 COLONIES/mL STAPHYLOCOCCUS EPIDERMIDIS (A)  Final   Report Status 11/07/2018 FINAL  Final   Organism ID, Bacteria STAPHYLOCOCCUS EPIDERMIDIS (A)  Final      Susceptibility  Staphylococcus epidermidis - MIC*    CIPROFLOXACIN >=8 RESISTANT Resistant     GENTAMICIN <=0.5 SENSITIVE Sensitive     NITROFURANTOIN <=16 SENSITIVE Sensitive     OXACILLIN <=0.25 SENSITIVE Sensitive     TETRACYCLINE 4 SENSITIVE Sensitive     VANCOMYCIN 1 SENSITIVE Sensitive     TRIMETH/SULFA <=10 SENSITIVE Sensitive     CLINDAMYCIN <=0.25 SENSITIVE Sensitive     RIFAMPIN <=0.5 SENSITIVE Sensitive     Inducible Clindamycin NEGATIVE Sensitive     * >=100,000 COLONIES/mL STAPHYLOCOCCUS EPIDERMIDIS  Culture, Urine     Status: Abnormal   Collection Time: 11/07/18  6:05 PM  Result Value Ref Range Status   Specimen Description URINE, CLEAN CATCH  Final   Special Requests NONE  Final   Culture (A)  Final    <10,000 COLONIES/mL INSIGNIFICANT GROWTH Performed at [facility], [address], [place], [place] [address]    Report Status 11/09/2018 FINAL  Final  Culture, blood (routine x 2)     Status: None   Collection Time: 11/07/18  6:36 PM  Result Value Ref Range Status   Specimen  Description BLOOD RIGHT ANTECUBITAL  Final   Special Requests   Final    BOTTLES DRAWN AEROBIC AND ANAEROBIC Blood Culture results may not be optimal due to an excessive volume of blood received in culture bottles   Culture   Final    NO GROWTH 5 DAYS Performed at [facility], [address] [place], [place] [address]    Report Status 11/12/2018 FINAL  Final  Culture, blood (routine x 2)     Status: None   Collection Time: 11/07/18  6:36 PM  Result Value Ref Range Status   Specimen Description BLOOD LEFT HAND  Final   Special Requests   Final    BOTTLES DRAWN AEROBIC AND ANAEROBIC Blood Culture results may not be optimal due to an excessive volume of blood received in culture bottles   Culture   Final    NO GROWTH 5 DAYS Performed at [facility], [address], [place], [place] [address]    Report Status 11/12/2018 FINAL  Final  Culture, Urine     Status: None   Collection Time: 11/09/18  6:00 PM  Result Value Ref Range Status   Specimen Description URINE, CLEAN CATCH  Final   Special Requests   Final    NONE Performed at [facility], [address] [place], [place] [address]    Culture NO GROWTH  Final   Report Status 11/11/2018 FINAL  Final      Studies: No results found.  Scheduled Meds: . docusate sodium  100 mg Oral BID  . feeding supplement  1 Container Oral TID BM  . mouth rinse  15 mL Mouth Rinse BID  . metoCLOPramide (REGLAN) injection  5 mg Intravenous Q6H  . multivitamin with minerals  1 tablet Oral Daily  . polyethylene glycol  17 g Oral Daily  . sodium chloride flush  3 mL Intravenous Q12H  . tamsulosin  0.4 mg Oral Daily  . Warfarin - Pharmacist Dosing Inpatient   Does not apply q1800    Continuous Infusions: . dextrose 5% lactated ringers with KCl 20 mEq/L 50 mL/hr at 11/13/18 1948  . heparin 2,350 Units/hr (11/13/18 1945)  . sodium chloride       LOS: 10 days     Barnetta Chapel, MD [facility] Pager [phone] If 7PM-7AM,  please contact night-coverage [email or web address] Password [facility] 11/13/2018, 9:31 PM

## 2018-11-13 NOTE — Final Consult Note (Signed)
Consultant Final Sign-Off Note    Assessment/Final recommendations  Tony Zhang is a 73 y.o. male followed by me for small bowel obstruction.  SBO - SBO protocol completed and contrast noted in his colon on delayed films -repeat CT scan 1/23 with no signs of SBO, contrast in colon - NG tube removed 1/23 - Patient tolerating diet. BM this morning. Passing flatus. No N/V  - Surgery will sign off  FEN -soft diet, Boost VTE -heparin gtt, okay to restart Coumadin from surgical standpoint ID -rocephin1/19>>1/21   Wound care (if applicable): N/A   Diet at discharge: Soft diet for 1 week, then regular diet   Activity at discharge: per primary team   Follow-up appointment: None needed   Pending results:  Unresulted Labs (From admission, onward)    Start     Ordered   11/09/18 0500  CBC  Daily,   R    Question:  Specimen collection method  Answer:  Lab=Lab collect   11/08/18 1323   11/06/18 0500  Heparin level (unfractionated)  Daily,   R    Question:  Specimen collection method  Answer:  Lab=Lab collect   11/05/18 2315   11/04/18 0500  Protime-INR  Daily,   R     11/03/18 1749           Medication recommendations: Bowel regimen per primary team to keep stools soft.    Other recommendations:     Thank you for allowing [place] to participate in the care of your patient!  Please consult [place] again if you have further needs for your patient.  Elmer Sow [facility] 11/13/2018 9:19 AM    Subjective  Pleasant this morning. Reports he tolerated diet overnight. No N/V. Some lower abdominal discomfort at times. None currently. +BM after enema last night. Loose BM this AM. Continues to pass flatus. Mobilizing halls.    Objective  Vital signs in last 24 hours: Temp:  [98.1 F (36.7 C)-98.4 F (36.9 C)] 98.1 F (36.7 C) (01/27 0450) Pulse Rate:  [89-102] 89 (01/27 0450) Resp:  [18-19] 18 (01/27 0450) BP: (137-158)/(83-86) 158/86 (01/27 0450) SpO2:  [96 %-97 %] 97 % (01/27  0450)  PE: Gen: Alert, NAD, pleasant Pulm: CTA b/l, effort normal Abd: Soft,protuberant, minimally TTP without rebound or guarding, +BS.  Skin: warm and dry    Pertinent labs and Studies: Recent Labs    11/11/18 0347 11/12/18 0446 11/13/18 0208  WBC 6.1 5.4 5.8  HGB 12.8* 12.7* 13.2  HCT 37.7* 39.1 40.0   BMET Recent Labs    11/12/18 0446 11/13/18 0208  NA 138 136  K 4.0 4.0  CL 106 108  CO2 22 19*  GLUCOSE 113* 111*  BUN 6* 9  CREATININE 1.16 1.22  CALCIUM 9.1 9.3   No results for input(s): LABURIN in the last 72 hours. Results for orders placed or performed during the hospital encounter of 11/03/18  Culture, Urine     Status: Abnormal   Collection Time: 11/05/18  9:22 AM  Result Value Ref Range Status   Specimen Description URINE, CLEAN CATCH  Final   Special Requests   Final    Normal Performed at [facility], [address] [place], [place] [address]    Culture >=100,000 COLONIES/mL STAPHYLOCOCCUS EPIDERMIDIS (A)  Final   Report Status 11/07/2018 FINAL  Final   Organism ID, Bacteria STAPHYLOCOCCUS EPIDERMIDIS (A)  Final      Susceptibility   Staphylococcus epidermidis - MIC*  CIPROFLOXACIN >=8 RESISTANT Resistant     GENTAMICIN <=0.5 SENSITIVE Sensitive     NITROFURANTOIN <=16 SENSITIVE Sensitive     OXACILLIN <=0.25 SENSITIVE Sensitive     TETRACYCLINE 4 SENSITIVE Sensitive     VANCOMYCIN 1 SENSITIVE Sensitive     TRIMETH/SULFA <=10 SENSITIVE Sensitive     CLINDAMYCIN <=0.25 SENSITIVE Sensitive     RIFAMPIN <=0.5 SENSITIVE Sensitive     Inducible Clindamycin NEGATIVE Sensitive     * >=100,000 COLONIES/mL STAPHYLOCOCCUS EPIDERMIDIS  Culture, Urine     Status: Abnormal   Collection Time: 11/07/18  6:05 PM  Result Value Ref Range Status   Specimen Description URINE, CLEAN CATCH  Final   Special Requests NONE  Final   Culture (A)  Final    <10,000 COLONIES/mL INSIGNIFICANT GROWTH Performed at [facility], [address]  [place], [place] [address]    Report Status 11/09/2018 FINAL  Final  Culture, blood (routine x 2)     Status: None   Collection Time: 11/07/18  6:36 PM  Result Value Ref Range Status   Specimen Description BLOOD RIGHT ANTECUBITAL  Final   Special Requests   Final    BOTTLES DRAWN AEROBIC AND ANAEROBIC Blood Culture results may not be optimal due to an excessive volume of blood received in culture bottles   Culture   Final    NO GROWTH 5 DAYS Performed at [facility], [address] [place], [place] [address]    Report Status 11/12/2018 FINAL  Final  Culture, blood (routine x 2)     Status: None   Collection Time: 11/07/18  6:36 PM  Result Value Ref Range Status   Specimen Description BLOOD LEFT HAND  Final   Special Requests   Final    BOTTLES DRAWN AEROBIC AND ANAEROBIC Blood Culture results may not be optimal due to an excessive volume of blood received in culture bottles   Culture   Final    NO GROWTH 5 DAYS Performed at [facility], [address], [place], [place] [address]    Report Status 11/12/2018 FINAL  Final  Culture, Urine     Status: None   Collection Time: 11/09/18  6:00 PM  Result Value Ref Range Status   Specimen Description URINE, CLEAN CATCH  Final   Special Requests   Final    NONE Performed at [facility], [address] [place], [place] [address]    Culture NO GROWTH  Final   Report Status 11/11/2018 FINAL  Final    Imaging: Dg Abd 1 View  Result Date: 11/12/2018 CLINICAL DATA:  Abdominal distension for several days EXAM: ABDOMEN - 1 VIEW COMPARISON:  11/10/2018 FINDINGS: Contrast is again noted throughout the colon similar to that seen on the prior exam likely related to a degree of constipation. No obstructive changes are seen. Vascular calcifications are noted. No bony abnormality is noted. IMPRESSION: No acute abnormality noted. Electronically Signed   By: Alcide Clever M.D.   On: 11/12/2018 18:21

## 2018-11-13 NOTE — Care Management Note (Signed)
Case Management Note  Patient Details  Name: Tony Zhang MRN: [id number] Date of Birth: 1946-01-03  Subjective/Objective:                    Action/Plan:  Continuing to follow for discharge needs Expected Discharge Date:  11/07/18               Expected Discharge Plan:  Home/Self Care  In-House Referral:  NA  Discharge planning Services  CM Consult  Post Acute Care Choice:  NA Choice offered to:     DME Arranged:  N/A DME Agency:  NA  HH Arranged:  NA HH Agency:  NA  Status of Service:  In process, will continue to follow  If discussed at Long Length of Stay Meetings, dates discussed:    Additional Comments:  Kingsley Plan, RN 11/13/2018, 4:03 PM

## 2018-11-14 LAB — PROTIME-INR
INR: 1.15
Prothrombin Time: 14.6 seconds (ref 11.4–15.2)

## 2018-11-14 LAB — CBC
HCT: 38.5 % — ABNORMAL LOW (ref 39.0–52.0)
Hemoglobin: 12.8 g/dL — ABNORMAL LOW (ref 13.0–17.0)
MCH: 32 pg (ref 26.0–34.0)
MCHC: 33.2 g/dL (ref 30.0–36.0)
MCV: 96.3 fL (ref 80.0–100.0)
Platelets: 178 10*3/uL (ref 150–400)
RBC: 4 MIL/uL — ABNORMAL LOW (ref 4.22–5.81)
RDW: 14.2 % (ref 11.5–15.5)
WBC: 5.9 10*3/uL (ref 4.0–10.5)
nRBC: 0.3 % — ABNORMAL HIGH (ref 0.0–0.2)

## 2018-11-14 LAB — HEPARIN LEVEL (UNFRACTIONATED): Heparin Unfractionated: 0.5 IU/mL (ref 0.30–0.70)

## 2018-11-14 MED ORDER — NEBIVOLOL HCL 10 MG PO TABS: 10.0000 mg | ORAL_TABLET | Freq: Every day | ORAL | 0 refills | Status: DC

## 2018-11-14 MED ORDER — LISINOPRIL 10 MG PO TABS: 10.0000 mg | ORAL_TABLET | Freq: Every day | ORAL | 11 refills | Status: DC

## 2018-11-14 MED ORDER — DOCUSATE SODIUM 100 MG PO CAPS: 100.0000 mg | ORAL_CAPSULE | Freq: Two times a day (BID) | ORAL | 0 refills | Status: DC

## 2018-11-14 MED ORDER — ENOXAPARIN SODIUM 40 MG/0.4ML ~~LOC~~ SOLN: 40.0000 mg | SUBCUTANEOUS | Status: DC

## 2018-11-14 MED ORDER — WARFARIN SODIUM 4 MG PO TABS
4.0000 mg | ORAL_TABLET | Freq: Once | ORAL | Status: DC
  Filled 2018-11-14: qty 1

## 2018-11-14 MED ORDER — ADULT MULTIVITAMIN W/MINERALS CH: 1.0000 | ORAL_TABLET | Freq: Every day | ORAL | 0 refills | Status: DC

## 2018-11-14 MED ORDER — TAMSULOSIN HCL 0.4 MG PO CAPS: 0.4000 mg | ORAL_CAPSULE | Freq: Every day | ORAL | 0 refills | Status: DC

## 2018-11-14 MED ORDER — ACETAMINOPHEN 325 MG PO TABS: 650.0000 mg | ORAL_TABLET | Freq: Four times a day (QID) | ORAL | 0 refills | Status: DC | PRN

## 2018-11-14 MED ORDER — POLYETHYLENE GLYCOL 3350 17 G PO PACK: 17.0000 g | PACK | Freq: Every day | ORAL | 0 refills | Status: DC

## 2018-11-14 MED ORDER — METOCLOPRAMIDE HCL 5 MG PO TABS: 5.0000 mg | ORAL_TABLET | Freq: Three times a day (TID) | ORAL | 1 refills | Status: DC

## 2018-11-14 MED ORDER — ENOXAPARIN SODIUM 100 MG/ML ~~LOC~~ SOLN: 100.0000 mg | Freq: Two times a day (BID) | SUBCUTANEOUS | 0 refills | Status: DC

## 2018-11-14 NOTE — Care Management Important Message (Signed)
Important Message  Patient Details  Name: Tony MinorsJerry F Delany MRN: [id number] Date of Birth: 10/15/1946   Medicare Important Message Given:  Yes    Dorena BodoIris Yacoub Diltz 11/14/2018, 7:37 AM

## 2018-11-14 NOTE — Discharge Summary (Signed)
Physician Discharge Summary  Patient ID: Tony MinorsJerry F Zhang MRN: [id number] DOB/AGE: 74-28-1947 74 y.o.  Admit date: 11/03/2018 Discharge date: 11/14/2018  Admission Diagnoses:  Discharge Diagnoses:  Principal Problem:   Small bowel obstruction (HCC) Active Problems:   AAA (abdominal aortic aneurysm) (HCC)   Chronic distal aortic occlusion (HCC)   DVT, lower extremity and pulmonary embolism, recurrent   Hyperlipidemia   COPD (chronic obstructive pulmonary disease) (HCC)   OSA (obstructive sleep apnea)   Peripheral vascular disease (HCC)   Anticoagulated on Coumadin   Discharged Condition: stable  Hospital Course:  Tony EndJerry F Zhang a 73 year old male, with past medical history significant for COPD, hypertension, peripheral vascular disease, morbid obesity, recurrent pneumonia, dyslipidemia, recurrent DVT on anticoagulation, self-reported prior cholecystectomy and hernia repair.  Patient presented with 3-day history of significant abdominal pain and distention, nausea and vomiting.  CT scan of the abdomen pelvis with contrast revealed small bowel obstruction.  Patient was admitted for further assessment and management.  Patient was managed conservatively.  NG tube was inserted to low suction.  Abnormal electrolytes (potassium and magnesium) were monitored and repleted during the hospital stay.  Small bowel obstruction has slowly resolved.  Patient's anticoagulation was changed to heparin drip during the hospital stay.  Coumadin was later restarted.  INR has been monitored.  Patient prefers to be discharged back home with Coumadin and subcutaneous Lovenox bridge.  Apparently, patient has administered Lovenox to self in the past.  INR will be monitored by the primary care provider on discharge, and subcutaneous Lovenox will be discontinued when INR is at goal.  Patient was also noted to have dysuria on presentation.  Urine was sent for culture, and IV Rocephin was started empirically.    Sepsis  secondary to small bowel obstruction versus UTI, both present on admission: -Presented with abdominal pain, distention, leukocytosis with WBC 21,000, heart rate of 111 and tachypnea with respiratory rate of 29. -CT abdomen and pelvis with contrast done on 11/03/2018 revealed findings consistent with early complete or partial small bowel obstruction with marked distention of the stomach and jejunum. -Patient has had prior abdominal surgeries, including cholecystectomy and hernia repair. -Patient reported dysuria.  UA suggestive of likely UTI.   -Urine culture was sent.   -IV Rocephin started empirically.   -Sepsis physiology resolved during the hospital stay.  UTI, present on admission Management as stated above 11/07/2018: Urine culture grew staph epidermidis.   -Repeated urinalysis and urine culture.   -Blood sample taken for culture as well.   -Discontinued IV Rocephin.    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   Continue to monitor.  Hypomagnesemia: Magnesium was 1.3 on presentation. Repleted.  Hypokalemia Continue to monitor potassium.  Recurrent DVT and PE. Patient was on chronic anticoagulation with warfarin prior to presentation. Coumadin was on hold Heparin drip until cleared from general surgery Coumadin restarted (Kindly see above)  Chronic distal aortic occlusion Diagnosed 5 years ago Seen again on CT abdomen and pelvis with contrast on admission  History of upper GI bleed Self-reported had an endoscopy in the past and treated for upper GI bleed Hemoglobin is stable No sign of overt bleeding  Peripheral vascular disease. Patient was on Pletal at home. Stable.  Bilateral renal artery stenosis Diagnosed 5 years ago  History of COPD. Continued inhalers. Stable.  Obesity: Body mass index was 31 kg/m.  Chronic  pain syndrome -  [facility] [place] [facility] [place]. - Last prescription for controlled substance was onDecember [id number] Norco.  Consults: general surgery  Significant Diagnostic Studies:   Discharge Exam: Blood pressure (!) 151/79, pulse (!) 102, temperature 98.2 F (36.8 C), temperature source Oral, resp. rate 18, height 6\' 1"  (1.854 m), weight 106.2 kg, SpO2 97 %.  Disposition: Discharge disposition: 01-Home or Self Care  Discharge Instructions    Diet - low sodium heart healthy   Complete by:  As directed    Discharge instructions   Complete by:  As directed    Check PT/INR at PCP's office in 2 days (11/16/2018).  Discontinue subcutaneous Lovenox when INR gets to 2 (patient's PCP to direct management)   Increase activity slowly   Complete by:  As directed      Allergies as of 11/14/2018      Reactions   Codeine Hives, Other (See Comments)   Takes benadryl to stop reaction      Medication List    STOP taking these medications   allopurinol 300 MG tablet Commonly known as:  ZYLOPRIM   HYDROcodone-acetaminophen 10-325 MG tablet Commonly known as:  NORCO   losartan-hydrochlorothiazide 100-25 MG tablet Commonly known as:  HYZAAR   methocarbamol 500 MG tablet Commonly known as:  ROBAXIN   TYLENOL 8 HOUR ARTHRITIS PAIN 650 MG CR tablet Generic drug:  acetaminophen Replaced by:  acetaminophen 325 MG tablet     TAKE these medications   acetaminophen 325 MG tablet Commonly known as:  TYLENOL Take 2 tablets (650 mg total) by mouth every 6 (six) hours as needed for mild pain or moderate pain (or Fever >/= 101). Replaces:  TYLENOL 8 HOUR ARTHRITIS PAIN 650 MG CR tablet   cilostazol 100 MG tablet Commonly known as:  PLETAL Take 1 tablet (100 mg total) by mouth 2 (two) times daily. Please schedule appointment for refills.   docusate sodium 100 MG capsule Commonly known as:  COLACE Take 1 capsule (100 mg total) by mouth 2 (two) times daily.    enoxaparin 100 MG/ML injection Commonly known as:  LOVENOX Inject 1 mL (100 mg total) into the skin 2 (two) times daily for 7 days. Stop when INR gets to 2   fenofibrate 160 MG tablet Take 1 tablet (160 mg total) by mouth daily. Please schedule appointment for further refills   lisinopril 10 MG tablet Commonly known as:  PRINIVIL,ZESTRIL Take 1 tablet (10 mg total) by mouth daily.   metoCLOPramide 5 MG tablet Commonly known as:  REGLAN Take 1 tablet (5 mg total) by mouth 3 (three) times daily.   multivitamin with minerals Tabs tablet Take 1 tablet by mouth daily. Start taking on:  November 15, 2018   nebivolol 10 MG tablet Commonly known as:  BYSTOLIC Take 1 tablet (10 mg total) by mouth daily.   pantoprazole 40 MG tablet Commonly known as:  PROTONIX Take 1 tablet (40 mg total) by mouth every 12 (twelve) hours. What changed:  when to take this   polyethylene glycol packet Commonly known as:  MIRALAX / GLYCOLAX Take 17 g by mouth daily. Start taking on:  November 15, 2018   [facility] HFA 108 (90 Base) MCG/ACT inhaler Generic drug:  albuterol Inhale 3-4 puffs into the lungs every 4 (four) hours as needed for wheezing or shortness of breath.   simvastatin 40 MG tablet Commonly known as:  ZOCOR Take 1 tablet (40 mg total) by mouth daily at  6 PM. Need appointment for refills   tamsulosin 0.4 MG Caps capsule Commonly known as:  FLOMAX Take 1 capsule (0.4 mg total) by mouth daily. Start taking on:  November 15, 2018   warfarin 2 MG tablet Commonly known as:  COUMADIN Take as directed. If you are unsure how to take this medication, talk to your nurse or doctor. Original instructions:  Take 1 tablet (2 mg total) by mouth daily. What changed:  additional instructions        Signed: Barnetta Chapel 11/14/2018, 2:30 PM

## 2018-11-14 NOTE — Progress Notes (Signed)
Patient discharged to home. Verbalizes understanding of all discharge instructions including discharge medications and follow up MD visits. Patient will be picked up by brother in law.

## 2018-11-14 NOTE — Care Management Note (Signed)
Case Management Note  Patient Details  Name: D'ARCY LEWIN MRN: [id number] Date of Birth: 15-Nov-1945  Subjective/Objective:                    Action/Plan:  Patient has been on Lovenox twice before. States he knows how to administer Lovenox injections. Dr Clarene Duke manages his INR. Patient states he has transportation to Dr [employer] office for INR checks and appointments.   Patient does not want home health.    Patient has prescription coverage, however does not have insurance card with him. Patient wants prescription sent to [facility] " they have all my prescription coverage information. "  Expected Discharge Date:  11/07/18               Expected Discharge Plan:  Home/Self Care  In-House Referral:  NA  Discharge planning Services  CM Consult  Post Acute Care Choice:  NA Choice offered to:  Patient  DME Arranged:  N/A DME Agency:  NA  HH Arranged:  NA HH Agency:  NA  Status of Service:  Completed, signed off  If discussed at Long Length of Stay Meetings, dates discussed:    Additional Comments:  Kingsley Plan, RN 11/14/2018, 12:05 PM

## 2018-11-14 NOTE — Progress Notes (Signed)
ANTICOAGULATION CONSULT NOTE - Follow Up Consult  Pharmacy Consult for Heparin and warfarin Indication: recurrent VTE's  Allergies  Allergen Reactions  . Codeine Hives and Other (See Comments)    Takes benadryl to stop reaction    Patient Measurements: Height: 6\' 1"  (185.4 cm) Weight: 234 lb 2.1 oz (106.2 kg) IBW/kg (Calculated) : 79.9 kg Heparin Dosing Weight: 102 kg  Vital Signs: Temp: 98.3 F (36.8 C) (01/28 0626) Temp Source: Oral (01/28 0626) BP: 154/81 (01/28 0626) Pulse Rate: 98 (01/28 0626)  Labs: Recent Labs    11/11/18 1818  11/12/18 0446 11/13/18 0208 11/14/18 0424  HGB  --    < > 12.7* 13.2 12.8*  HCT  --   --  39.1 40.0 38.5*  PLT  --   --  192 198 178  LABPROT  --   --  15.8* 14.4 14.6  INR  --   --  1.27 1.13 1.15  HEPARINUNFRC  --   --  0.61 0.33 0.50  CREATININE 1.19  --  1.16 1.22  --    < > = values in this interval not displayed.    Estimated Creatinine Clearance: 69 mL/min (by C-G formula based on SCr of 1.22 mg/dL).   Assessment: Pt on chronic coumadin for recurrent VTE's.  Heparin is currently infusing at 2350 units/hr with a therapeutic heparin level. Pt was on coumadin 2 mg daily prior to admission with INR 3.23 on admission. Pt has received coumadin 3mg  - 2mg  - 2mg  - 4mg  with INR 1.15 today CBC stable and no bleeding noted.  Goal of Therapy:  INR 2-3 Heparin level 0.3-0.7 units/ml Monitor platelets by anticoagulation protocol: Yes   Plan:  Continue heparin at 2350 units/hr Coumadin 4 mg today Daily heparin level, CBC, and PT/INR  Natasha Bence 11/14/2018,12:18 PM

## 2019-06-22 ENCOUNTER — Encounter: Payer: Self-pay | Admitting: Cardiovascular Disease

## 2019-06-26 ENCOUNTER — Other Ambulatory Visit: Payer: Self-pay

## 2019-06-26 ENCOUNTER — Encounter: Payer: Self-pay | Admitting: Cardiovascular Disease

## 2019-06-26 ENCOUNTER — Ambulatory Visit (INDEPENDENT_AMBULATORY_CARE_PROVIDER_SITE_OTHER): Payer: Medicare Other | Admitting: Cardiovascular Disease

## 2019-06-26 VITALS — BP 127/80 | HR 98 | Ht 73.0 in | Wt 206.2 lb

## 2019-06-26 DIAGNOSIS — I739 Peripheral vascular disease, unspecified: Secondary | ICD-10-CM | POA: Diagnosis not present

## 2019-06-26 DIAGNOSIS — I2510 Atherosclerotic heart disease of native coronary artery without angina pectoris: Secondary | ICD-10-CM

## 2019-06-26 DIAGNOSIS — I824Z9 Acute embolism and thrombosis of unspecified deep veins of unspecified distal lower extremity: Secondary | ICD-10-CM

## 2019-06-26 DIAGNOSIS — E782 Mixed hyperlipidemia: Secondary | ICD-10-CM

## 2019-06-26 DIAGNOSIS — I519 Heart disease, unspecified: Secondary | ICD-10-CM | POA: Diagnosis not present

## 2019-06-26 DIAGNOSIS — I350 Nonrheumatic aortic (valve) stenosis: Secondary | ICD-10-CM

## 2019-06-26 DIAGNOSIS — Z7901 Long term (current) use of anticoagulants: Secondary | ICD-10-CM

## 2019-06-26 DIAGNOSIS — I10 Essential (primary) hypertension: Secondary | ICD-10-CM

## 2019-06-26 NOTE — Patient Instructions (Signed)
Medication Instructions:  STOP the Fenofibrate If you need a refill on your cardiac medications before your next appointment, please call your pharmacy.   Lab work: Your provider would like for you to return on the same day as your echo to have the following labs drawn: FASTING Lipid. You do not need an appointment for the lab. Once in our office lobby there is a podium where you can sign in and ring the doorbell to alert [place] that you are here. The lab is open from 8:00 am to 4:30 pm; closed for lunch from 12:45pm-1:45pm.  If you have labs (blood work) drawn today and your tests are completely normal, you will receive your results only by: [name] MyChart Message (if you have MyChart) OR . A paper copy in the mail If you have any lab test that is abnormal or we need to change your treatment, we will call you to review the results.  Testing/Procedures: Your physician has requested that you have an echocardiogram. Echocardiography is a painless test that uses sound waves to create images of your heart. It provides your doctor with information about the size and shape of your heart and how well your heart's chambers and valves are working. You may receive an ultrasound enhancing agent through an IV if needed to better visualize your heart during the echo.This procedure takes approximately one hour. There are no restrictions for this procedure. This will take place at the [address].    Follow-Up: At [facility], you and your health needs are our priority.  As part of our continuing mission to provide you with exceptional heart care, we have created designated Provider Care Teams.  These Care Teams include your primary Cardiologist (physician) and Advanced Practice Providers (APPs -  Physician Assistants and Nurse Practitioners) who all work together to provide you with the care you need, when you need it. You will need a follow up appointment in 12 months.  Please call our office 2  months in advance to schedule this appointment.  You may see Sanda Klein, MD or one of the following Advanced Practice Providers on your designated Care Team: [place], [place] . Fabian Sharp, PA-C
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
## 2019-06-27 ENCOUNTER — Other Ambulatory Visit: Payer: Self-pay

## 2019-06-27 ENCOUNTER — Other Ambulatory Visit (INDEPENDENT_AMBULATORY_CARE_PROVIDER_SITE_OTHER): Payer: Medicare Other

## 2019-06-27 DIAGNOSIS — I739 Peripheral vascular disease, unspecified: Secondary | ICD-10-CM

## 2019-06-27 DIAGNOSIS — I519 Heart disease, unspecified: Secondary | ICD-10-CM

## 2019-06-27 DIAGNOSIS — I824Z9 Acute embolism and thrombosis of unspecified deep veins of unspecified distal lower extremity: Secondary | ICD-10-CM

## 2019-06-27 DIAGNOSIS — I350 Nonrheumatic aortic (valve) stenosis: Secondary | ICD-10-CM

## 2019-06-27 DIAGNOSIS — I2510 Atherosclerotic heart disease of native coronary artery without angina pectoris: Secondary | ICD-10-CM

## 2019-06-27 DIAGNOSIS — I10 Essential (primary) hypertension: Secondary | ICD-10-CM

## 2019-06-27 DIAGNOSIS — E782 Mixed hyperlipidemia: Secondary | ICD-10-CM

## 2019-07-09 ENCOUNTER — Ambulatory Visit (HOSPITAL_COMMUNITY): Payer: Medicare Other | Attending: Cardiology

## 2019-07-09 ENCOUNTER — Other Ambulatory Visit: Payer: Medicare Other | Admitting: *Deleted

## 2019-07-09 ENCOUNTER — Other Ambulatory Visit: Payer: Self-pay | Admitting: Cardiovascular Disease

## 2019-07-09 ENCOUNTER — Other Ambulatory Visit: Payer: Self-pay

## 2019-07-09 DIAGNOSIS — I350 Nonrheumatic aortic (valve) stenosis: Secondary | ICD-10-CM

## 2019-07-09 LAB — LIPID PANEL
Chol/HDL Ratio: 4.3 ratio (ref 0.0–5.0)
Cholesterol, Total: 129 mg/dL (ref 100–199)
HDL: 30 mg/dL — ABNORMAL LOW (ref >39)
LDL Chol Calc (NIH): 64 mg/dL (ref 0–99)
Triglycerides: 210 mg/dL — ABNORMAL HIGH (ref 0–149)
VLDL Cholesterol Cal: 35 mg/dL (ref 5–40)

## 2019-08-06 ENCOUNTER — Encounter (HOSPITAL_COMMUNITY): Payer: Self-pay | Admitting: Emergency Medicine

## 2019-08-06 ENCOUNTER — Inpatient Hospital Stay (HOSPITAL_COMMUNITY)
Admission: EM | Admit: 2019-08-06 | Discharge: 2019-08-14 | DRG: 871 | Disposition: A | Discharge status: Home / Self Care | Payer: Medicare Other | Attending: Internal Medicine | Admitting: Internal Medicine

## 2019-08-06 ENCOUNTER — Inpatient Hospital Stay (HOSPITAL_COMMUNITY): Payer: Medicare Other

## 2019-08-06 ENCOUNTER — Other Ambulatory Visit: Payer: Self-pay

## 2019-08-06 ENCOUNTER — Emergency Department (HOSPITAL_COMMUNITY): Payer: Medicare Other

## 2019-08-06 DIAGNOSIS — I110 Hypertensive heart disease with heart failure: Secondary | ICD-10-CM | POA: Diagnosis present

## 2019-08-06 DIAGNOSIS — I5042 Chronic combined systolic (congestive) and diastolic (congestive) heart failure: Secondary | ICD-10-CM | POA: Diagnosis present

## 2019-08-06 DIAGNOSIS — A419 Sepsis, unspecified organism: Principal | ICD-10-CM | POA: Diagnosis present

## 2019-08-06 DIAGNOSIS — L89151 Pressure ulcer of sacral region, stage 1: Secondary | ICD-10-CM | POA: Clinically undetermined

## 2019-08-06 DIAGNOSIS — E782 Mixed hyperlipidemia: Secondary | ICD-10-CM

## 2019-08-06 DIAGNOSIS — D696 Thrombocytopenia, unspecified: Secondary | ICD-10-CM | POA: Diagnosis not present

## 2019-08-06 DIAGNOSIS — E871 Hypo-osmolality and hyponatremia: Secondary | ICD-10-CM | POA: Diagnosis not present

## 2019-08-06 DIAGNOSIS — I82402 Acute embolism and thrombosis of unspecified deep veins of left lower extremity: Secondary | ICD-10-CM

## 2019-08-06 DIAGNOSIS — K219 Gastro-esophageal reflux disease without esophagitis: Secondary | ICD-10-CM | POA: Diagnosis present

## 2019-08-06 DIAGNOSIS — I739 Peripheral vascular disease, unspecified: Secondary | ICD-10-CM | POA: Diagnosis present

## 2019-08-06 DIAGNOSIS — I350 Nonrheumatic aortic (valve) stenosis: Secondary | ICD-10-CM | POA: Diagnosis present

## 2019-08-06 DIAGNOSIS — K30 Functional dyspepsia: Secondary | ICD-10-CM | POA: Diagnosis present

## 2019-08-06 DIAGNOSIS — R042 Hemoptysis: Secondary | ICD-10-CM | POA: Diagnosis not present

## 2019-08-06 DIAGNOSIS — R7989 Other specified abnormal findings of blood chemistry: Secondary | ICD-10-CM | POA: Diagnosis not present

## 2019-08-06 DIAGNOSIS — J9601 Acute respiratory failure with hypoxia: Secondary | ICD-10-CM

## 2019-08-06 DIAGNOSIS — Z8249 Family history of ischemic heart disease and other diseases of the circulatory system: Secondary | ICD-10-CM

## 2019-08-06 DIAGNOSIS — N179 Acute kidney failure, unspecified: Secondary | ICD-10-CM | POA: Diagnosis present

## 2019-08-06 DIAGNOSIS — D649 Anemia, unspecified: Secondary | ICD-10-CM | POA: Diagnosis not present

## 2019-08-06 DIAGNOSIS — J440 Chronic obstructive pulmonary disease with acute lower respiratory infection: Secondary | ICD-10-CM | POA: Diagnosis present

## 2019-08-06 DIAGNOSIS — M109 Gout, unspecified: Secondary | ICD-10-CM | POA: Diagnosis present

## 2019-08-06 DIAGNOSIS — D72829 Elevated white blood cell count, unspecified: Secondary | ICD-10-CM | POA: Diagnosis not present

## 2019-08-06 DIAGNOSIS — E785 Hyperlipidemia, unspecified: Secondary | ICD-10-CM | POA: Diagnosis present

## 2019-08-06 DIAGNOSIS — J42 Unspecified chronic bronchitis: Secondary | ICD-10-CM | POA: Diagnosis not present

## 2019-08-06 DIAGNOSIS — J189 Pneumonia, unspecified organism: Secondary | ICD-10-CM

## 2019-08-06 DIAGNOSIS — I2489 Other forms of acute ischemic heart disease: Secondary | ICD-10-CM

## 2019-08-06 DIAGNOSIS — Z86711 Personal history of pulmonary embolism: Secondary | ICD-10-CM

## 2019-08-06 DIAGNOSIS — Z7901 Long term (current) use of anticoagulants: Secondary | ICD-10-CM

## 2019-08-06 DIAGNOSIS — J441 Chronic obstructive pulmonary disease with (acute) exacerbation: Secondary | ICD-10-CM | POA: Diagnosis present

## 2019-08-06 DIAGNOSIS — I248 Other forms of acute ischemic heart disease: Secondary | ICD-10-CM | POA: Diagnosis present

## 2019-08-06 DIAGNOSIS — E872 Acidosis: Secondary | ICD-10-CM | POA: Diagnosis present

## 2019-08-06 DIAGNOSIS — I82409 Acute embolism and thrombosis of unspecified deep veins of unspecified lower extremity: Secondary | ICD-10-CM | POA: Diagnosis present

## 2019-08-06 DIAGNOSIS — I2510 Atherosclerotic heart disease of native coronary artery without angina pectoris: Secondary | ICD-10-CM | POA: Diagnosis present

## 2019-08-06 DIAGNOSIS — R6521 Severe sepsis with septic shock: Secondary | ICD-10-CM | POA: Diagnosis present

## 2019-08-06 DIAGNOSIS — L8990 Pressure ulcer of unspecified site, unspecified stage: Secondary | ICD-10-CM | POA: Insufficient documentation

## 2019-08-06 DIAGNOSIS — I701 Atherosclerosis of renal artery: Secondary | ICD-10-CM | POA: Diagnosis present

## 2019-08-06 DIAGNOSIS — E538 Deficiency of other specified B group vitamins: Secondary | ICD-10-CM | POA: Diagnosis present

## 2019-08-06 DIAGNOSIS — I714 Abdominal aortic aneurysm, without rupture: Secondary | ICD-10-CM | POA: Diagnosis present

## 2019-08-06 DIAGNOSIS — J449 Chronic obstructive pulmonary disease, unspecified: Secondary | ICD-10-CM | POA: Diagnosis present

## 2019-08-06 DIAGNOSIS — Z885 Allergy status to narcotic agent status: Secondary | ICD-10-CM

## 2019-08-06 DIAGNOSIS — R17 Unspecified jaundice: Secondary | ICD-10-CM | POA: Diagnosis not present

## 2019-08-06 DIAGNOSIS — I2699 Other pulmonary embolism without acute cor pulmonale: Secondary | ICD-10-CM | POA: Diagnosis present

## 2019-08-06 DIAGNOSIS — Z8679 Personal history of other diseases of the circulatory system: Secondary | ICD-10-CM

## 2019-08-06 DIAGNOSIS — R791 Abnormal coagulation profile: Secondary | ICD-10-CM | POA: Diagnosis present

## 2019-08-06 DIAGNOSIS — R0602 Shortness of breath: Secondary | ICD-10-CM

## 2019-08-06 DIAGNOSIS — Z20828 Contact with and (suspected) exposure to other viral communicable diseases: Secondary | ICD-10-CM | POA: Diagnosis present

## 2019-08-06 DIAGNOSIS — J869 Pyothorax without fistula: Secondary | ICD-10-CM

## 2019-08-06 DIAGNOSIS — N170 Acute kidney failure with tubular necrosis: Secondary | ICD-10-CM | POA: Diagnosis present

## 2019-08-06 DIAGNOSIS — Z87891 Personal history of nicotine dependence: Secondary | ICD-10-CM

## 2019-08-06 DIAGNOSIS — Z79899 Other long term (current) drug therapy: Secondary | ICD-10-CM

## 2019-08-06 LAB — CBC WITH DIFFERENTIAL/PLATELET
Abs Immature Granulocytes: 0.2 10*3/uL — ABNORMAL HIGH (ref 0.00–0.07)
Basophils Absolute: 0.1 10*3/uL (ref 0.0–0.1)
Basophils Relative: 0 %
Eosinophils Absolute: 0 10*3/uL (ref 0.0–0.5)
Eosinophils Relative: 0 %
HCT: 50.6 % (ref 39.0–52.0)
Hemoglobin: 16.9 g/dL (ref 13.0–17.0)
Immature Granulocytes: 1 %
Lymphocytes Relative: 3 %
Lymphs Abs: 0.4 10*3/uL — ABNORMAL LOW (ref 0.7–4.0)
MCH: 31.1 pg (ref 26.0–34.0)
MCHC: 33.4 g/dL (ref 30.0–36.0)
MCV: 93.2 fL (ref 80.0–100.0)
Monocytes Absolute: 0.9 10*3/uL (ref 0.1–1.0)
Monocytes Relative: 6 %
Neutro Abs: 14.3 10*3/uL — ABNORMAL HIGH (ref 1.7–7.7)
Neutrophils Relative %: 90 %
Platelets: 307 10*3/uL (ref 150–400)
RBC: 5.43 MIL/uL (ref 4.22–5.81)
RDW: 15.9 % — ABNORMAL HIGH (ref 11.5–15.5)
WBC: 15.9 10*3/uL — ABNORMAL HIGH (ref 4.0–10.5)
nRBC: 0 % (ref 0.0–0.2)

## 2019-08-06 LAB — URINALYSIS, ROUTINE W REFLEX MICROSCOPIC
Bilirubin Urine: NEGATIVE
Glucose, UA: NEGATIVE mg/dL
Hgb urine dipstick: NEGATIVE
Ketones, ur: NEGATIVE mg/dL
Nitrite: NEGATIVE
Protein, ur: NEGATIVE mg/dL
Specific Gravity, Urine: 1.014 (ref 1.005–1.030)
pH: 6 (ref 5.0–8.0)

## 2019-08-06 LAB — BLOOD GAS, ARTERIAL
Acid-base deficit: 4.1 mmol/L — ABNORMAL HIGH (ref 0.0–2.0)
Bicarbonate: 19.8 mmol/L — ABNORMAL LOW (ref 20.0–28.0)
Drawn by: 308601
FIO2: 100
O2 Saturation: 95.3 %
Patient temperature: 99.8
pCO2 arterial: 36.2 mmHg (ref 32.0–48.0)
pH, Arterial: 7.362 (ref 7.350–7.450)
pO2, Arterial: 85.2 mmHg (ref 83.0–108.0)

## 2019-08-06 LAB — COMPREHENSIVE METABOLIC PANEL
ALT: 11 U/L (ref 0–44)
AST: 21 U/L (ref 15–41)
Albumin: 4 g/dL (ref 3.5–5.0)
Alkaline Phosphatase: 71 U/L (ref 38–126)
Anion gap: 12 (ref 5–15)
BUN: 20 mg/dL (ref 8–23)
CO2: 21 mmol/L — ABNORMAL LOW (ref 22–32)
Calcium: 9.4 mg/dL (ref 8.9–10.3)
Chloride: 103 mmol/L (ref 98–111)
Creatinine, Ser: 1.45 mg/dL — ABNORMAL HIGH (ref 0.61–1.24)
GFR calc Af Amer: 55 mL/min — ABNORMAL LOW (ref >60)
GFR calc non Af Amer: 47 mL/min — ABNORMAL LOW (ref >60)
Glucose, Bld: 105 mg/dL — ABNORMAL HIGH (ref 70–99)
Potassium: 4 mmol/L (ref 3.5–5.1)
Sodium: 136 mmol/L (ref 135–145)
Total Bilirubin: 1.1 mg/dL (ref 0.3–1.2)
Total Protein: 7.4 g/dL (ref 6.5–8.1)

## 2019-08-06 LAB — TROPONIN I (HIGH SENSITIVITY)
Troponin I (High Sensitivity): 148 ng/L (ref <18)
Troponin I (High Sensitivity): 269 ng/L (ref <18)
Troponin I (High Sensitivity): 254 ng/L (ref <18)

## 2019-08-06 LAB — PROTIME-INR
INR: 1.7 — ABNORMAL HIGH (ref 0.8–1.2)
Prothrombin Time: 19.3 seconds — ABNORMAL HIGH (ref 11.4–15.2)

## 2019-08-06 LAB — LACTIC ACID, PLASMA
Lactic Acid, Venous: 2.8 mmol/L (ref 0.5–1.9)
Lactic Acid, Venous: 1.8 mmol/L (ref 0.5–1.9)
Lactic Acid, Venous: 2.1 mmol/L (ref 0.5–1.9)

## 2019-08-06 LAB — BRAIN NATRIURETIC PEPTIDE: B Natriuretic Peptide: 99.5 pg/mL (ref 0.0–100.0)

## 2019-08-06 LAB — SARS CORONAVIRUS 2 BY RT PCR (HOSPITAL ORDER, PERFORMED IN ~~LOC~~ HOSPITAL LAB): SARS Coronavirus 2: NEGATIVE

## 2019-08-06 LAB — STREP PNEUMONIAE URINARY ANTIGEN: Strep Pneumo Urinary Antigen: NEGATIVE

## 2019-08-06 LAB — D-DIMER, QUANTITATIVE: D-Dimer, Quant: 11.28 ug/mL-FEU — ABNORMAL HIGH (ref 0.00–0.50)

## 2019-08-06 LAB — PROCALCITONIN: Procalcitonin: 15.48 ng/mL

## 2019-08-06 LAB — HEPARIN LEVEL (UNFRACTIONATED): Heparin Unfractionated: <0.1 IU/mL — ABNORMAL LOW (ref 0.30–0.70)

## 2019-08-06 MED ORDER — VANCOMYCIN HCL 10 G IV SOLR
1500.0000 mg | INTRAVENOUS | Status: DC
  Administered 2019-08-07: 1500 mg via INTRAVENOUS
  Filled 2019-08-06: qty 1500

## 2019-08-06 MED ORDER — SODIUM CHLORIDE 0.9 % IV BOLUS: 1000.0000 mL | Freq: Once | INTRAVENOUS | Status: AC

## 2019-08-06 MED ORDER — DIAZEPAM 2 MG PO TABS
2.0000 mg | ORAL_TABLET | Freq: Every evening | ORAL | Status: DC
  Administered 2019-08-06 – 2019-08-13 (×7): 2 mg via ORAL
  Filled 2019-08-06 (×7): qty 1

## 2019-08-06 MED ORDER — ALLOPURINOL 300 MG PO TABS
300.0000 mg | ORAL_TABLET | Freq: Every day | ORAL | Status: DC
  Administered 2019-08-06 – 2019-08-14 (×9): 300 mg via ORAL
  Filled 2019-08-06: qty 1
  Filled 2019-08-06: qty 3
  Filled 2019-08-06: qty 1
  Filled 2019-08-06: qty 3
  Filled 2019-08-06 (×2): qty 1
  Filled 2019-08-06 (×2): qty 3
  Filled 2019-08-06: qty 1

## 2019-08-06 MED ORDER — HEPARIN BOLUS VIA INFUSION
4000.0000 [IU] | Freq: Once | INTRAVENOUS | Status: AC
  Administered 2019-08-06: 4000 [IU] via INTRAVENOUS
  Filled 2019-08-06: qty 4000

## 2019-08-06 MED ORDER — SODIUM CHLORIDE 0.9 % IV SOLN: 2.0000 g | Freq: Two times a day (BID) | INTRAVENOUS | Status: DC

## 2019-08-06 MED ORDER — SIMVASTATIN 40 MG PO TABS
40.0000 mg | ORAL_TABLET | Freq: Every day | ORAL | Status: DC
  Administered 2019-08-06 – 2019-08-13 (×8): 40 mg via ORAL
  Filled 2019-08-06 (×8): qty 1

## 2019-08-06 MED ORDER — SODIUM CHLORIDE 0.9 % IV BOLUS
1000.0000 mL | Freq: Once | INTRAVENOUS | Status: AC
  Administered 2019-08-06: 1000 mL via INTRAVENOUS

## 2019-08-06 MED ORDER — ACETAMINOPHEN 325 MG PO TABS
650.0000 mg | ORAL_TABLET | Freq: Four times a day (QID) | ORAL | Status: DC | PRN
  Administered 2019-08-06 – 2019-08-09 (×3): 650 mg via ORAL
  Filled 2019-08-06 (×4): qty 2

## 2019-08-06 MED ORDER — SODIUM CHLORIDE 0.9 % IV SOLN
2.0000 g | INTRAVENOUS | Status: DC
  Filled 2019-08-06: qty 20

## 2019-08-06 MED ORDER — ACETAMINOPHEN 500 MG PO TABS
1000.0000 mg | ORAL_TABLET | Freq: Once | ORAL | Status: AC
  Administered 2019-08-06: 1000 mg via ORAL
  Filled 2019-08-06: qty 2

## 2019-08-06 MED ORDER — SODIUM CHLORIDE 0.9 % IV BOLUS
500.0000 mL | Freq: Once | INTRAVENOUS | Status: AC
  Administered 2019-08-06: 500 mL via INTRAVENOUS

## 2019-08-06 MED ORDER — BUDESONIDE 0.25 MG/2ML IN SUSP
0.2500 mg | Freq: Two times a day (BID) | RESPIRATORY_TRACT | Status: DC
  Administered 2019-08-06 – 2019-08-07 (×3): 0.25 mg via RESPIRATORY_TRACT
  Filled 2019-08-06 (×3): qty 2

## 2019-08-06 MED ORDER — SODIUM CHLORIDE 0.9 % IV SOLN
2.0000 g | Freq: Two times a day (BID) | INTRAVENOUS | Status: DC
  Administered 2019-08-06 – 2019-08-08 (×4): 2 g via INTRAVENOUS
  Filled 2019-08-06 (×4): qty 2

## 2019-08-06 MED ORDER — SODIUM CHLORIDE 0.9 % IV SOLN
2.0000 g | INTRAVENOUS | Status: AC
  Administered 2019-08-06: 2 g via INTRAVENOUS
  Filled 2019-08-06: qty 2

## 2019-08-06 MED ORDER — ONDANSETRON HCL 4 MG PO TABS
4.0000 mg | ORAL_TABLET | Freq: Four times a day (QID) | ORAL | Status: DC | PRN
  Administered 2019-08-07 – 2019-08-11 (×3): 4 mg via ORAL
  Filled 2019-08-06 (×3): qty 1

## 2019-08-06 MED ORDER — GUAIFENESIN ER 600 MG PO TB12
600.0000 mg | ORAL_TABLET | Freq: Two times a day (BID) | ORAL | Status: DC
  Administered 2019-08-06 – 2019-08-14 (×17): 600 mg via ORAL
  Filled 2019-08-06 (×17): qty 1

## 2019-08-06 MED ORDER — HYDROCODONE-ACETAMINOPHEN 10-325 MG PO TABS
1.0000 | ORAL_TABLET | ORAL | Status: DC
  Administered 2019-08-06 – 2019-08-14 (×44): 1 via ORAL
  Filled 2019-08-06 (×45): qty 1

## 2019-08-06 MED ORDER — VANCOMYCIN HCL 10 G IV SOLR
1500.0000 mg | Freq: Once | INTRAVENOUS | Status: AC
  Administered 2019-08-06: 1500 mg via INTRAVENOUS
  Filled 2019-08-06: qty 1500

## 2019-08-06 MED ORDER — HEPARIN (PORCINE) 25000 UT/250ML-% IV SOLN
1900.0000 [IU]/h | INTRAVENOUS | Status: DC
  Administered 2019-08-06: 1500 [IU]/h via INTRAVENOUS
  Administered 2019-08-07: 1900 [IU]/h via INTRAVENOUS
  Administered 2019-08-07 (×2): 1700 [IU]/h via INTRAVENOUS
  Filled 2019-08-06 (×3): qty 250

## 2019-08-06 MED ORDER — ACETAMINOPHEN 650 MG RE SUPP: 650.0000 mg | Freq: Four times a day (QID) | RECTAL | Status: DC | PRN

## 2019-08-06 MED ORDER — LEVALBUTEROL HCL 0.63 MG/3ML IN NEBU
0.6300 mg | INHALATION_SOLUTION | Freq: Four times a day (QID) | RESPIRATORY_TRACT | Status: DC
  Administered 2019-08-06: 0.63 mg via RESPIRATORY_TRACT
  Filled 2019-08-06: qty 3

## 2019-08-06 MED ORDER — VANCOMYCIN HCL 10 G IV SOLR
2000.0000 mg | Freq: Once | INTRAVENOUS | Status: AC
  Administered 2019-08-06: 2000 mg via INTRAVENOUS
  Filled 2019-08-06: qty 2000

## 2019-08-06 MED ORDER — LEVALBUTEROL HCL 0.63 MG/3ML IN NEBU
0.6300 mg | INHALATION_SOLUTION | RESPIRATORY_TRACT | Status: DC | PRN
  Administered 2019-08-07 – 2019-08-10 (×3): 0.63 mg via RESPIRATORY_TRACT
  Filled 2019-08-06 (×3): qty 3

## 2019-08-06 MED ORDER — LEVALBUTEROL HCL 0.63 MG/3ML IN NEBU
0.6300 mg | INHALATION_SOLUTION | Freq: Three times a day (TID) | RESPIRATORY_TRACT | Status: DC
  Administered 2019-08-06 – 2019-08-07 (×3): 0.63 mg via RESPIRATORY_TRACT
  Filled 2019-08-06 (×3): qty 3

## 2019-08-06 MED ORDER — PANTOPRAZOLE SODIUM 40 MG PO TBEC
40.0000 mg | DELAYED_RELEASE_TABLET | Freq: Two times a day (BID) | ORAL | Status: DC
  Administered 2019-08-06 – 2019-08-14 (×17): 40 mg via ORAL
  Filled 2019-08-06 (×18): qty 1

## 2019-08-06 MED ORDER — CILOSTAZOL 100 MG PO TABS
100.0000 mg | ORAL_TABLET | Freq: Two times a day (BID) | ORAL | Status: DC
  Administered 2019-08-06 – 2019-08-14 (×17): 100 mg via ORAL
  Filled 2019-08-06 (×18): qty 1

## 2019-08-06 MED ORDER — NOREPINEPHRINE 4 MG/250ML-% IV SOLN
0.0000 ug/min | INTRAVENOUS | Status: DC
  Administered 2019-08-06: 10 ug/min via INTRAVENOUS
  Administered 2019-08-07: 2 ug/min via INTRAVENOUS
  Filled 2019-08-06: qty 250

## 2019-08-06 MED ORDER — SODIUM CHLORIDE 0.9 % IV SOLN
500.0000 mg | Freq: Every day | INTRAVENOUS | Status: DC
  Administered 2019-08-06: 500 mg via INTRAVENOUS
  Filled 2019-08-06: qty 500

## 2019-08-06 MED ORDER — SENNOSIDES-DOCUSATE SODIUM 8.6-50 MG PO TABS: 1.0000 | ORAL_TABLET | Freq: Every evening | ORAL | Status: DC | PRN

## 2019-08-06 MED ORDER — CYCLOBENZAPRINE HCL 10 MG PO TABS: 10.0000 mg | ORAL_TABLET | Freq: Three times a day (TID) | ORAL | Status: DC | PRN

## 2019-08-06 MED ORDER — ONDANSETRON HCL 4 MG/2ML IJ SOLN
4.0000 mg | Freq: Four times a day (QID) | INTRAMUSCULAR | Status: DC | PRN
  Administered 2019-08-09: 18:00:00 4 mg via INTRAVENOUS
  Filled 2019-08-06: qty 2

## 2019-08-06 MED ORDER — CHLORHEXIDINE GLUCONATE CLOTH 2 % EX PADS
6.0000 | MEDICATED_PAD | Freq: Every day | CUTANEOUS | Status: DC
  Administered 2019-08-07 – 2019-08-09 (×3): 6 via TOPICAL

## 2019-08-06 NOTE — Progress Notes (Signed)
ANTICOAGULATION CONSULT NOTE - Initial Consult  Pharmacy Consult for heparin Indication: VTE treatment  Allergies  Allergen Reactions  . Codeine Hives and Other (See Comments)    Takes benadryl to stop reaction    Patient Measurements: Height: 6\' 1"  (185.4 cm) Weight: 206 lb (93.4 kg) IBW/kg (Calculated) : 79.9 Heparin Dosing Weight:   Vital Signs: Temp: 98.9 F (37.2 C) (10/19 0756) Temp Source: Oral (10/19 0756) BP: 177/77 (10/19 1030) Pulse Rate: 90 (10/19 1030)  Labs: Recent Labs    08/06/19 0430 08/06/19 0839  HGB 16.9  --   HCT 50.6  --   PLT 307  --   LABPROT 19.3*  --   INR 1.7*  --   CREATININE 1.45*  --   TROPONINIHS 148* 269*    Estimated Creatinine Clearance: 50.5 mL/min (A) (by C-G formula based on SCr of 1.45 mg/dL (H)).   Medical History: Past Medical History:  Diagnosis Date  . AAA (abdominal aortic aneurysm) ([place])   . Blood dyscrasia    followed by Dr. Benay Spice, for clotting issue, has been on Coumadin for about 20 yrs.    [name] COPD (chronic obstructive pulmonary disease) ([place])   . DJD (degenerative joint disease)   . Dyslipidemia   . Gout   . Malignant hypertension   . Peripheral vascular disease ([place])   . Pneumonia 08/2013   pt. reports that he is having this surgery 11/25/2014- for the lung problem that began with pneumonia in 09/2014  . Shortness of breath   . 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  Pharmacy consulted to dose heparin. Pt takes coumadin 2mg  po daily, last dose unknown.  08/06/2019 INR 1.7 APTT 19.3, CBC WNL Scr 1.45  Goal of Therapy:  Heparin level 0.3-0.7 units/ml Monitor platelets by anticoagulation protocol   Plan:  Heparin bolus 4000 units x 1 then start heparin drip at 1500  units/hr Heparin level in 8 hours Daily CBC   Dolly Rias RPh 08/06/2019, 10:37 AM Pager [phone]

## 2019-08-06 NOTE — Progress Notes (Signed)
Results for NHIA, HEAPHY (MRN [id number]) as of 08/06/2019 04:34  Ref. Range 08/06/2019 04:05  Sample type Unknown ARTERIAL DRAW  Delivery systems Unknown NON-REBREATHER OXYGEN MASK  FIO2 Unknown 100.00  pH, Arterial Latest Ref Range: 7.350 - 7.450  7.362  pCO2 arterial Latest Ref Range: 32.0 - 48.0 mmHg 36.2  pO2, Arterial Latest Ref Range: 83.0 - 108.0 mmHg 85.2  Acid-base deficit Latest Ref Range: 0.0 - 2.0 mmol/L 4.1 (H)  Bicarbonate Latest Ref Range: 20.0 - 28.0 mmol/L 19.8 (L)  O2 Saturation Latest Units: % 95.3  Patient temperature Unknown 99.8  Collection site Unknown RIGHT RADIAL  Allens test (pass/fail) Latest Ref Range: PASS  PASS

## 2019-08-06 NOTE — ED Notes (Signed)
CRITICAL VALUE STICKER  CRITICAL VALUE: Trop 148  RECEIVER (on-site recipient of call): Ethelene Browns RN  [place] NOTIFIED: 08/06/19 0524  MESSENGER (representative from lab): Blanch Media  PROVIDER NOTIFIED: Izell [place] PA-C  TIME OF NOTIFICATION: 0525  RESPONSE: see orders

## 2019-08-06 NOTE — H&P (Addendum)
History and Physical  Tony Zhang YWV:[id number] DOB: [birth year] DOA: 08/06/2019   Patient coming from: Home & is able to ambulate  Chief Complaint: SOB and worsening cough  HPI: Tony Zhang is a 73 y.o. 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  Patient reported being short of breath, with wet cough/congestion, but unable to cough up anything.  Patient now complains of chest pain, worse on inspiration, substernal, nothing makes it better or worse.  Patient reports subjective fever/chills.  Patient denies any sick contacts or known Covid exposure, mostly at home by himself.  Patient denies any abdominal pain, nausea/vomiting, dizziness.  Of note, patient has history of empyema in 2016 requiring evacuation.  Patient does not use oxygen at home  ED Course: Initially O2 sats was 77% on room air, he was placed on a nonrebreather with improvement into the 90s, fever of 102.6, tachycardic, hypotensive, labs showed WBC of 15.9, lactic acid of 2.8, creatinine of 1.45, worse than his baseline, troponin of 148, EKG with no acute ST changes, chest x-ray with patchy groundglass opacities in the left mid and lower lung zone as well as right lung suspicious for multifocal pneumonia, COVID-19 was negative.  Patient admitted in the stepdown unit for further management.  Cardiology was consulted.  Review of Systems: Review of systems are otherwise negative   Past Medical History:  Diagnosis Date  . AAA (abdominal aortic aneurysm) (HCC)   . 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    [name] COPD (chronic obstructive pulmonary disease) (HCC)   . DJD (degenerative joint  disease)   . Dyslipidemia   . Gout   . Malignant hypertension   . Peripheral vascular disease (HCC)   . Pneumonia 08/2013   pt. reports that he is having this surgery 11/25/2014- for the lung problem that began with pneumonia in 09/2014  . Shortness of breath   . Venous thrombosis    Recurrent   Past Surgical History:  Procedure Laterality Date  . APPENDECTOMY    . BACK SURGERY    . COLONOSCOPY N/A 04/13/2015   Procedure: COLONOSCOPY;  Surgeon: Carman Ching, MD;  Location: [facility] ENDOSCOPY;  Service: Endoscopy;  Laterality: N/A;  . EMPYEMA DRAINAGE N/A 11/25/2014   Procedure: EMPYEMA DRAINAGE;  Surgeon: Delight Ovens, MD;  Location: [facility] OR;  Service: Thoracic;  Laterality: N/A;  . ESOPHAGOGASTRODUODENOSCOPY N/A 04/10/2015   Procedure: ESOPHAGOGASTRODUODENOSCOPY (EGD);  Surgeon: Charlott Rakes, MD;  Location: [facility] ENDOSCOPY;  Service: Endoscopy;  Laterality: N/A;  . EYE SURGERY     both eyes, cataracts removed, denies lens implants  . FLEXIBLE SIGMOIDOSCOPY N/A 04/12/2015   Procedure: FLEXIBLE SIGMOIDOSCOPY;  Surgeon: Carman Ching, MD;  Location: [facility] ENDOSCOPY;  Service: Endoscopy;  Laterality: N/A;  . HERNIA REPAIR    . SHOULDER SURGERY Right   . VIDEO ASSISTED THORACOSCOPY Right 11/25/2014   Procedure: VIDEO ASSISTED THORACOSCOPY;  Surgeon: Delight Ovens, MD;  Location: [facility] OR;  Service: Thoracic;  Laterality: Right;  [name] VIDEO BRONCHOSCOPY N/A 11/25/2014   Procedure: VIDEO BRONCHOSCOPY;  Surgeon: Delight Ovens, MD;  Location: [facility] OR;  Service: Thoracic;  Laterality: N/A;    Social History:  reports that he quit smoking  about 26 years ago. He has never used smokeless tobacco. He reports that he does not drink alcohol or use drugs.   Allergies  Allergen Reactions  . Codeine Hives and Other (See Comments)    Takes benadryl to stop reaction    Family History  Problem Relation Age of Onset  . CAD Father 3750  . Liver cancer Brother   . CAD Sister       Prior to Admission  medications   Medication Sig Start Date End Date Taking? Authorizing Provider  allopurinol (ZYLOPRIM) 300 MG tablet Take 300 mg by mouth daily.    [provider]  cilostazol (PLETAL) 100 MG tablet Take 1 tablet (100 mg total) by mouth 2 (two) times daily. Please schedule appointment for refills. 03/07/17   Croitoru, Mihai, MD  losartan-hydrochlorothiazide (HYZAAR) 100-25 MG tablet Take 1 tablet by mouth daily.    [provider]  nebivolol (BYSTOLIC) 10 MG tablet Take 1 tablet (10 mg total) by mouth daily. 11/14/18   Barnetta Chapelgbata, Sylvester I, MD  pantoprazole (PROTONIX) 40 MG tablet Take 1 tablet (40 mg total) by mouth every 12 (twelve) hours. Patient taking differently: Take 40 mg by mouth 2 (two) times daily.  04/13/15   Catarina Hartshornat, David, MD  simvastatin (ZOCOR) 40 MG tablet Take 1 tablet (40 mg total) by mouth daily at 6 PM. Need appointment for refills 05/10/17   Croitoru, Mihai, MD  warfarin (COUMADIN) 2 MG tablet Take 1 tablet (2 mg total) by mouth daily. Patient taking differently: Take 2 mg by mouth daily. Take 7 days a week 08/14/15   Ladene ArtistSherrill, Gary B, MD    Physical Exam: BP (!) 98/48   Pulse 91   Temp (!) 102.6 F (39.2 C) (Rectal)   Resp (!) 30   Ht 6\' 1"  (1.854 m)   Wt 93.4 kg   SpO2 99%   BMI 27.18 kg/m   General: In moderate distress, unable to complete full sentences, chronically ill-appearing, toxic appearance Eyes: Normal ENT: Normal Neck: Supple Cardiovascular: S1, S2 present Respiratory: Rhonchi breath sounds bilaterally, diminished air entry noted Abdomen: Soft, nontender, nondistended, bowel sounds present Skin: Normal Musculoskeletal: No pedal edema bilaterally noted Psychiatric: Normal mood Neurologic: No obvious focal neurologic deficit noted          Labs on Admission:  Basic Metabolic Panel: Recent Labs  Lab 08/06/19 0430  NA 136  K 4.0  CL 103  CO2 21*  GLUCOSE 105*  BUN 20  CREATININE 1.45*  CALCIUM 9.4   Liver Function Tests:  Recent Labs  Lab 08/06/19 0430  AST 21  ALT 11  ALKPHOS 71  BILITOT 1.1  PROT 7.4  ALBUMIN 4.0   No results for input(s): LIPASE, AMYLASE in the last 168 hours. No results for input(s): AMMONIA in the last 168 hours. CBC: Recent Labs  Lab 08/06/19 0430  WBC 15.9*  NEUTROABS 14.3*  HGB 16.9  HCT 50.6  MCV 93.2  PLT 307   Cardiac Enzymes: No results for input(s): CKTOTAL, CKMB, CKMBINDEX, TROPONINI in the last 168 hours.  BNP (last 3 results) No results for input(s): BNP in the last 8760 hours.  ProBNP (last 3 results) No results for input(s): PROBNP in the last 8760 hours.  CBG: No results for input(s): GLUCAP in the last 168 hours.  Radiological Exams on Admission: Dg Chest Port 1 View  Result Date: 08/06/2019 CLINICAL DATA:  Cough, fever, shortness of breath. EXAM: PORTABLE CHEST 1 VIEW COMPARISON:  Radiograph 03/05/2018 FINDINGS:  Patchy and ground-glass consolidation in the left mid and lower lung zone. Additional patchy opacity in the periphery of the right mid lung. Heart is normal in size with unchanged mediastinal contours. Aortic atherosclerosis. No pulmonary edema. No large pleural effusion. No pneumothorax. No acute osseous abnormalities are seen. IMPRESSION: 1. Patchy and ground-glass opacities in the left mid and lower lung zone and periphery of the right mid lung, suspicious for multifocal pneumonia. Followup PA and lateral chest X-ray is recommended in 3-4 weeks following trial of antibiotic therapy to ensure resolution and exclude underlying malignancy. 2.  Aortic Atherosclerosis (ICD10-I70.0). Electronically Signed   By: Keith Rake M.D.   On: 08/06/2019 04:15    EKG: Independently reviewed, no acute ST changes noted  Assessment/Plan Present on Admission: . Sepsis due to pneumonia ([place]) . DVT, lower extremity and pulmonary embolism, recurrent . Hyperlipidemia . Gout . COPD (chronic obstructive pulmonary disease) ([place]) . AKI (acute kidney injury)  ([place])  Principal Problem:   Sepsis due to pneumonia [name]) Active Problems:   DVT, lower extremity and pulmonary embolism, recurrent   Hyperlipidemia   Gout   COPD (chronic obstructive pulmonary disease) (HCC)   AKI (acute kidney injury) ([place])  Sepsis 2/2 multifocal pneumonia Acute respiratory failure with hypoxia Currently saturating >90% on nonrebreather, moderate distress Febrile, tachycardic, hypotensive, leukocytosis on admission COVID-19 negative Lactic acid 2.8, will trend BC x2 pending Procalcitonin pending Urine strep pneumo, Legionella pending UA/UC pending Chest x-ray with patchy groundglass opacities in the left mid and lower lung zone as well as right lung suspicious for multifocal pneumonia Broad-spectrum antibiotics vancomycin, cefepime started, continue for now Levalbuterol scheduled/as needed, Pulmicort nebs, Mucinex Monitor closely in stepdown unit PCCM consulted  AKI Creatinine 1.45 Likely due to above, in addition to poor oral intake Status post IV fluids If persistent on repeat, may order renal ultrasound Daily BMP  Hypotension (history of hypertension) Currently hypotensive, status post IV fluids PCCM consulted: Started on low-dose pressors, will follow Hold home antihypertensives, losartan/HCTZ, BB  Elevated troponin/history of severe PAD/CAD Reports chest pain Could be demand ischemia from sepsis vs ACS Initial troponin I48--> 269 EKG with no acute ST changes Last echo done on 07/09/2019 showed EF of 60 to 16%, LV diastolic parameters consistent with impaired relaxation, moderate aortic valve stenosis Continue cilostazol for severe PAD Start IV Heparin Follows with Dr. Sallyanne Kuster, last seen in office on 06/26/2019 Cardiology consulted  History of thromboembolic events D-dimer elevated Patient has had at least 2 episodes of DVT/PE in the past On chronic Coumadin, INR today subtherapeutic Bilateral LE Doppler pending Consult to pharmacy for  Heparin dosing for now  History of chronic systolic/diastolic HF Appears compensated BNP 99.5 Echo as above Continue to hold losartan/HCTZ, BB due to hypotension Strict I's and O's, daily weights  Hyperlipidemia Continue statins  GERD Continue PPI  Gout Continue allopurinol         DVT prophylaxis: Coumadin  Code Status: Full  Family Communication: None at bedside  Disposition Plan: To be determined  Consults called: Cardiology, PCCM  Admission status: Inpatient    [name] MD [facility]   If 7PM-7AM, please contact night-coverage [email or web address]  08/06/2019, 8:14 AM
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
## 2019-08-06 NOTE — Consult Note (Signed)
NAME:  Tony Zhang, MRN:  [id number], DOB:  12-May-1946, LOS: 0 ADMISSION DATE:  08/06/2019, CONSULTATION DATE: 08/06/2019 REFERRING MD: Dr. Sharolyn Douglas, CHIEF COMPLAINT: Sepsis  Brief History   73 year old gentleman admitted for left lower lobe pneumonia sepsis.  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 Tyrone Sage.  Presents to the emergency department with complaints of shortness of breath cough and congestion.  Patient was found to be hypoxic febrile.  Hypotensive with a systolic blood pressure in the 90s.  Patient was found to have an elevated white blood cell count of 15.9 lactic acidosis of 2.8 elevated serum creatinine 1.45.  Chest x-ray with left lower lobe infiltrate consistent with pneumonia.  Past Medical History   Past Medical History:  Diagnosis Date  . AAA (abdominal aortic aneurysm) (HCC)   . 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    [name] COPD (chronic obstructive pulmonary disease) (HCC)   . DJD (degenerative joint disease)   . Dyslipidemia   . Gout   . Malignant hypertension   . Peripheral vascular disease (HCC)   . Pneumonia 08/2013   pt. reports that he is having this surgery 11/25/2014- for the lung problem that began with pneumonia in 09/2014  . Shortness of breath   . Venous thrombosis    Recurrent     Significant Hospital Events   ICU admission   Consults:  PCCM   Procedures:  08/06/2019: Bedside ultrasound the left chest evidence of consolidated lung, very small rim of pleural fluid no indication for thoracentesis likely reactive from left-sided lower lobe pneumonia.  Significant Diagnostic Tests:  Procalcitonin: 15  Micro Data:  COVID-19: Negative  Antimicrobials:  Cefepime Vancomycin   Interim history/subjective:  Patient states for the past several days has had increasing fatigue cough shortness of breath.  He also complains of left-sided chest pain with deep inspiration.  Objective   Blood pressure (!) 103/40, pulse 91, temperature 98.9 F (37.2 C), temperature source Oral, resp. rate (!) 28, height  (1.854 m), weight 93.4 kg, SpO2 95 %.        Intake/Output Summary (Last 24 hours) at 08/06/2019 0949 Last data filed at 08/06/2019 0800 Gross per 24 hour  Intake 1850.98 ml  Output -  Net 1850.98 ml   Filed Weights   08/06/19 0352  Weight: 93.4 kg    Examination: General: Elderly male resting in bed, tachypneic HENT: NCAT, sclera clear following commands Lungs: Rhonchi in the left chest, absent breath sounds in the left base Cardiovascular: Regular rate and rhythm, S1-S2 Abdomen: Soft nontender nondistended Extremities: No edema Neuro: Awake alert following commands no focal deficit GU: Deferred  Resolved Hospital Problem list     Assessment & Plan:   Septic shock Community-acquired pneumonia Left lower lobe pneumonia Acute hypoxemic respiratory failure requiring nasal cannula O2 supplementation Left-sided pleuritic chest pain, bedside ultrasound with very small amount of pleural fluid, no obvious loculated effusion or need for thoracentesis -Continue antimicrobials, cefepime and vancomycin already started by primary -Sputum cultures, blood cultures pending -Patient has already received 30 cc/kg of fluid resuscitation -Start low-dose peripheral norepinephrine -Maintain mean arterial pressure greater than 65, current mentation is stable even with map in the high 50s/60s. -Need to avoid any additional endorgan damage from low  diastolic blood pressures. -Patient needs aggressive pulmonary toileting -Flutter valve I-S, left lower lobe CPT -Up in chair as much as tolerated  Acute renal failure -Secondary to above, probable mixed sepsis plus  prerenal state poor p.o. intake for several days -Avoid nephrotoxic drugs -Renally dose adjust as appropriate.  Elevated serum troponin History of CAD, PAD History of chronic systolic and diastolic heart failure -Suspect related to sepsis syndrome above. -Holding home blood pressure medications and beta-blockade.  Positive D-dimer History of VTE Subtherapeutic INR -Recommend checking bilateral lower extremity duplex -Start heparin -Can restart Coumadin if patient ends up not needing any procedures   Best practice:  Diet: Per primary Pain/Anxiety/Delirium protocol (if indicated): Not applicable VAP protocol (if indicated): Not applicable DVT prophylaxis: Heparin GI prophylaxis: PPI Glucose control: CBG Mobility: Up in chair Code Status: Full code Family Communication: Discussed with patient Disposition: SDU   Labs   CBC: Recent Labs  Lab 08/06/19 0430  WBC 15.9*  NEUTROABS 14.3*  HGB 16.9  HCT 50.6  MCV 93.2  PLT 307    Basic Metabolic Panel: Recent Labs  Lab 08/06/19 0430  NA 136  K 4.0  CL 103  CO2 21*  GLUCOSE 105*  BUN 20  CREATININE 1.45*  CALCIUM 9.4   GFR: Estimated Creatinine Clearance: 50.5 mL/min (A) (by C-G formula based on SCr of 1.45 mg/dL (H)). Recent Labs  Lab 08/06/19 0430 08/06/19 0839  PROCALCITON  --  15.48  WBC 15.9*  --   LATICACIDVEN 2.8* 1.8    Liver Function Tests: Recent Labs  Lab 08/06/19 0430  AST 21  ALT 11  ALKPHOS 71  BILITOT 1.1  PROT 7.4  ALBUMIN 4.0   No results for input(s): LIPASE, AMYLASE in the last 168 hours. No results for input(s): AMMONIA in the last 168 hours.  ABG    Component Value Date/Time   PHART 7.362 08/06/2019 0405   PCO2ART 36.2 08/06/2019 0405   PO2ART 85.2 08/06/2019 0405   HCO3 19.8 (L) 08/06/2019 0405   TCO2 21 (L) 11/03/2018 1441   ACIDBASEDEF 4.1 (H) 08/06/2019 0405   O2SAT 95.3 08/06/2019 0405     Coagulation Profile: Recent Labs  Lab 08/06/19 0430  INR 1.7*     Cardiac Enzymes: No results for input(s): CKTOTAL, CKMB, CKMBINDEX, TROPONINI in the last 168 hours.  HbA1C: No results found for: HGBA1C  CBG: No results for input(s): GLUCAP in the last 168 hours.  Review of Systems:   Review of Systems  Constitutional: Positive for chills, fever, malaise/fatigue and weight loss.  HENT: Negative for hearing loss, sore throat and tinnitus.   Eyes: Negative for blurred vision and double vision.  Respiratory: Positive for cough, sputum production and shortness of breath. Negative for hemoptysis, wheezing and stridor.   Cardiovascular: Positive for chest pain. Negative for palpitations, orthopnea, leg swelling and PND.       Inspiratory left-sided pleuritic chest pain  Gastrointestinal: Negative for abdominal pain, constipation, diarrhea, heartburn, nausea and vomiting.  Genitourinary: Negative for dysuria, hematuria and urgency.  Musculoskeletal: Negative for joint pain and myalgias.  Skin: Negative for itching and rash.  Neurological: Negative for dizziness, tingling, weakness and headaches.  Endo/Heme/Allergies: Negative for environmental allergies. Does not bruise/bleed easily.  Psychiatric/Behavioral: Negative for depression. The patient is not nervous/anxious and does not have insomnia.   All other systems reviewed and are negative.     Past Medical History  He,  has a past medical history of AAA (abdominal aortic aneurysm) (HCC), Blood  dyscrasia, COPD (chronic obstructive pulmonary disease) (HCC), DJD (degenerative joint disease), Dyslipidemia, Gout, Malignant hypertension, Peripheral vascular disease (HCC), Pneumonia (08/2013), Shortness of breath, and Venous thrombosis.   Surgical History    Past Surgical History:  Procedure Laterality Date  . APPENDECTOMY    . BACK SURGERY    . COLONOSCOPY N/A 04/13/2015   Procedure: COLONOSCOPY;  Surgeon: Carman ChingJames Edwards, MD;  [facility];  Service: Endoscopy;  Laterality: N/A;  . EMPYEMA  DRAINAGE N/A 11/25/2014   Procedure: EMPYEMA DRAINAGE;  Surgeon: Delight OvensEdward B Gerhardt, MD;  [facility];  Service: Thoracic;  Laterality: N/A;  . ESOPHAGOGASTRODUODENOSCOPY N/A 04/10/2015   Procedure: ESOPHAGOGASTRODUODENOSCOPY (EGD);  Surgeon: Charlott RakesVincent Schooler, MD;  [facility];  Service: Endoscopy;  Laterality: N/A;  . EYE SURGERY     both eyes, cataracts removed, denies lens implants  . FLEXIBLE SIGMOIDOSCOPY N/A 04/12/2015   Procedure: FLEXIBLE SIGMOIDOSCOPY;  Surgeon: Carman ChingJames Edwards, MD;  [facility];  Service: Endoscopy;  Laterality: N/A;  . HERNIA REPAIR    . SHOULDER SURGERY Right   . VIDEO ASSISTED THORACOSCOPY Right 11/25/2014   Procedure: VIDEO ASSISTED THORACOSCOPY;  Surgeon: Delight OvensEdward B Gerhardt, MD;  [facility];  Service: Thoracic;  Laterality: Right;  [name]. VIDEO BRONCHOSCOPY N/A 11/25/2014   Procedure: VIDEO BRONCHOSCOPY;  Surgeon: Delight OvensEdward B Gerhardt, MD;  [facility];  Service: Thoracic;  Laterality: N/A;     Social History   reports that he quit smoking about 26 years ago. He has never used smokeless tobacco. He reports that he does not drink alcohol or use drugs.   Family History   His family history includes CAD in his sister; CAD (age of onset: 4650) in his father; Liver cancer in his brother.   Allergies Allergies  Allergen Reactions  . Codeine Hives and Other (See Comments)    Takes benadryl to stop reaction     Home Medications  Prior to Admission medications   Medication Sig Start Date End Date Taking? Authorizing Provider  allopurinol (ZYLOPRIM) 300 MG tablet Take 300 mg by mouth daily.    [provider]  cilostazol (PLETAL) 100 MG tablet Take 1 tablet (100 mg total) by mouth 2 (two) times daily. Please schedule appointment for refills. 03/07/17   Croitoru, Mihai, MD  losartan-hydrochlorothiazide (HYZAAR) 100-25 MG tablet Take 1 tablet by mouth daily.    [provider]  nebivolol (BYSTOLIC) 10 MG tablet Take 1 tablet (10 mg  total) by mouth daily. 11/14/18   Barnetta Chapelgbata, Sylvester I, MD  pantoprazole (PROTONIX) 40 MG tablet Take 1 tablet (40 mg total) by mouth every 12 (twelve) hours. Patient taking differently: Take 40 mg by mouth 2 (two) times daily.  04/13/15   Catarina Hartshornat, David, MD  simvastatin (ZOCOR) 40 MG tablet Take 1 tablet (40 mg total) by mouth daily at 6 PM. Need appointment for refills 05/10/17   Croitoru, Mihai, MD  warfarin (COUMADIN) 2 MG tablet Take 1 tablet (2 mg total) by mouth daily. Patient taking differently: Take 2 mg by mouth daily. Take 7 days a week 08/14/15   Ladene ArtistSherrill, Gary B, MD     This patient is critically ill with multiple organ system failure; which, requires frequent high complexity decision making, assessment, support, evaluation, and titration of therapies. This was completed through the application of advanced monitoring technologies and extensive interpretation of multiple databases. During this encounter critical care time was devoted to patient care services described in this note for 34 minutes.   Rachel BoBradley L  Valeta Harms DO [facility] 08/06/2019 9:53 AM  Personal pager: [phone] If unanswered, please page CCM On-call: [phone]

## 2019-08-06 NOTE — ED Notes (Signed)
Date and time results received: 08/06/19 5:15 AM (use smartphrase ".now" to insert current time)  Test: Lactic Acid Critical Value: 2.8  Name of Provider Notified: Lattie Haw PA  Orders Received? Or Actions Taken?:

## 2019-08-06 NOTE — ED Triage Notes (Signed)
Pt arrived via EMS. Pt had SOB, and had SpO2 of 77 on RA. Pt on nonrebreather at 12 L and that brought SpO2 to 92. Pt has hx of aortic aneurism 30 years ago. EKG shows sinus tach.

## 2019-08-06 NOTE — Progress Notes (Signed)
Pharmacy Antibiotic Note  Tony Zhang is a 73 y.o. male admitted on 08/06/2019 with sepsis/pna  Pharmacy has been consulted for vancomycin and cefepime dosing.  Plan: Vancomycin 2gm IV x 1 then 1500mg  q24h (AUC 495.4, Scr 1.45)   vanc load and maintenance dose given on same day.  Will push out next dose Cefepime 2gm IV q12h Follow renal function, cultures and clinical course vanc levels as indicated  Height: 6\' 1"  (185.4 cm) Weight: 206 lb (93.4 kg) IBW/kg (Calculated) : 79.9  Temp (24hrs), Avg:100.4 F (38 C), Min:98.9 F (37.2 C), Max:102.6 F (39.2 C)  Recent Labs  Lab 08/06/19 0430 08/06/19 0839  WBC 15.9*  --   CREATININE 1.45*  --   LATICACIDVEN 2.8* 1.8    Estimated Creatinine Clearance: 50.5 mL/min (A) (by C-G formula based on SCr of 1.45 mg/dL (H)).    Allergies  Allergen Reactions  . Codeine Hives and Other (See Comments)    Takes benadryl to stop reaction    Antimicrobials this admission: 10/19 azith x 1 10/19 vanc >> 10/19 cefepime >>   Dose adjustments this admission:   Microbiology results: 10/19 BCx:   Thank you for allowing pharmacy to be a part of this patient's care.  Dolly Rias RPh 08/06/2019, 9:47 AM Pager [phone]

## 2019-08-06 NOTE — Progress Notes (Signed)
PT demonstrated hands on understanding of Flutter device- NPC at this time. 

## 2019-08-06 NOTE — Progress Notes (Signed)
A consult was received from an ED physician for cefepime and vancomycin per pharmacy dosing.  The patient's profile has been reviewed for ht/wt/allergies/indication/available labs.   A one time order has been placed for Cefepime 2 gm and Vancomycin 2 Gm .  Further antibiotics/pharmacy consults should be ordered by admitting physician if indicated.                       Thank you, Dorrene German 08/06/2019  4:52 AM

## 2019-08-06 NOTE — Progress Notes (Signed)
Bilateral lower extremity venous duplex completed. Refer to "CV Proc" under chart review to view preliminary results.  08/06/2019 11:38 AM Maudry Mayhew, MHA, RVT, RDCS, RDMS

## 2019-08-06 NOTE — ED Provider Notes (Signed)
[facility]-EMERGENCY DEPT Provider Note   CSN: [id number] Arrival date & time: 08/06/19  7628     History   Chief Complaint Chief Complaint  Patient presents with   Shortness of Breath    HPI ISRRAEL FLUCKIGER is a 73 y.o. male.     The history is provided by the patient and medical records.  Shortness of Breath Associated symptoms: cough      73 year old male with history of blood dyscrasia, COPD, dyslipidemia, hypertension, peripheral vascular disease, history of AAA, presenting to the ED with shortness of breath.  States he has been feeling short of breath for a few days now, but got acutely worse around 11 PM last evening.  He has had wet cough and feels congestion in his chest but is not able to bring anything up.  He denies any vomiting.  He has felt warm and sweaty but unsure about fever.  Temp was 100.81F with EMS.  He was not given any medications.  Initially O2 sats were 77% on room air, he was placed on nonrebreather with improvement into the 90s.  He generally does not require home O2.  He is on Coumadin for history of DVT and PE.  He denies any current chest pain.  He denies any sick contacts or known Covid exposures.  He lives at home and has mostly been by himself.  States symptoms today feel like when he had pneumonia previously.  He does report history of recurrent pneumonia.  Past Medical History:  Diagnosis Date   AAA (abdominal aortic aneurysm) (HCC)    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     COPD (chronic obstructive pulmonary disease) (HCC)    DJD (degenerative joint disease)    Dyslipidemia    Gout    Malignant hypertension    Peripheral vascular disease (HCC)    Pneumonia 08/2013   pt. reports that he is having this surgery 11/25/2014- for the lung problem that began with pneumonia in 09/2014   Shortness of breath    Venous thrombosis    Recurrent     Patient Active Problem List   Diagnosis Date Noted   Small bowel obstruction (HCC) 11/03/2018   History of pulmonary embolism 02/09/2016   Acute GI bleeding    Anemia due to blood loss, acute    GI bleed 04/08/2015   GIB (gastrointestinal bleeding) 04/08/2015   AKI (acute kidney injury) (HCC) 04/08/2015   Empyema lung (HCC)    Loculated pleural effusion    Pneumothorax    Empyema, right (HCC) 11/25/2014   Persistent right pleural effusion and right lower lobe infiltrate 11/14/2014   HCAP (healthcare-associated pneumonia) 10/10/2014   Peripheral vascular disease (HCC) 10/10/2014   Anticoagulated on Coumadin 10/10/2014   Supratherapeutic INR 10/10/2014   Microcytic anemia 09/18/2013   CAP (community acquired pneumonia) 09/09/2013   AAA (abdominal aortic aneurysm) (HCC) 08/19/2013   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   Malignant HTN with heart disease, w/o CHF, with chronic kidney disease 08/19/2013   DVT, lower extremity and pulmonary embolism, recurrent 08/19/2013   Hyperlipidemia 08/19/2013   Gout 08/19/2013   COPD (chronic obstructive pulmonary disease) (HCC) 08/19/2013   OSA (obstructive sleep apnea) 08/19/2013   Abnormal nuclear stress test 08/19/2013   Bilateral renal artery stenosis (HCC) 08/19/2013   DVT (deep venous thrombosis) (HCC) 06/28/2012    Past Surgical History:  Procedure Laterality Date  APPENDECTOMY     BACK SURGERY     COLONOSCOPY N/A 04/13/2015   Procedure: COLONOSCOPY;  Surgeon: Laurence Spates, MD;  Location: [place];  Service: Endoscopy;  Laterality: N/A;   EMPYEMA DRAINAGE N/A 11/25/2014   Procedure: EMPYEMA DRAINAGE;  Surgeon: Grace Isaac, MD;  Location: [place];  Service: Thoracic;  Laterality: N/A;   ESOPHAGOGASTRODUODENOSCOPY N/A 04/10/2015   Procedure: ESOPHAGOGASTRODUODENOSCOPY (EGD);  Surgeon: Wilford Corner, MD;  Location: [facility] ENDOSCOPY;  Service: Endoscopy;  Laterality: N/A;   EYE  SURGERY     both eyes, cataracts removed, denies lens implants   FLEXIBLE SIGMOIDOSCOPY N/A 04/12/2015   Procedure: FLEXIBLE SIGMOIDOSCOPY;  Surgeon: Laurence Spates, MD;  Location: [place];  Service: Endoscopy;  Laterality: N/A;   HERNIA REPAIR     SHOULDER SURGERY Right    VIDEO ASSISTED THORACOSCOPY Right 11/25/2014   Procedure: VIDEO ASSISTED THORACOSCOPY;  Surgeon: Grace Isaac, MD;  Location: [place];  Service: Thoracic;  Laterality: Right;   VIDEO BRONCHOSCOPY N/A 11/25/2014   Procedure: VIDEO BRONCHOSCOPY;  Surgeon: Grace Isaac, MD;  Location: [facility] OR;  Service: Thoracic;  Laterality: N/A;        Home Medications    Prior to Admission medications   Medication Sig Start Date End Date Taking? Authorizing Provider  allopurinol (ZYLOPRIM) 300 MG tablet Take 300 mg by mouth daily.    [provider]  cilostazol (PLETAL) 100 MG tablet Take 1 tablet (100 mg total) by mouth 2 (two) times daily. Please schedule appointment for refills. 03/07/17   Croitoru, Mihai, MD  losartan-hydrochlorothiazide (HYZAAR) 100-25 MG tablet Take 1 tablet by mouth daily.    [provider]  nebivolol (BYSTOLIC) 10 MG tablet Take 1 tablet (10 mg total) by mouth daily. 11/14/18   Bonnell Public, MD  pantoprazole (PROTONIX) 40 MG tablet Take 1 tablet (40 mg total) by mouth every 12 (twelve) hours. Patient taking differently: Take 40 mg by mouth 2 (two) times daily.  04/13/15   Orson Eva, MD  simvastatin (ZOCOR) 40 MG tablet Take 1 tablet (40 mg total) by mouth daily at 6 PM. Need appointment for refills 05/10/17   Croitoru, Mihai, MD  warfarin (COUMADIN) 2 MG tablet Take 1 tablet (2 mg total) by mouth daily. Patient taking differently: Take 2 mg by mouth daily. Take 7 days a week 08/14/15   Ladell Pier, MD    Family History Family History  Problem Relation Age of Onset   CAD Father 30   Liver cancer Brother    CAD Sister     Social History Social History    Tobacco Use   Smoking status: Former Smoker    Quit date: 10/18/1992    Years since quitting: 26.8   Smokeless tobacco: Never Used  Substance Use Topics   Alcohol use: No   Drug use: No     Allergies   Codeine   Review of Systems Review of Systems  Respiratory: Positive for cough and shortness of breath.   All other systems reviewed and are negative.    Physical Exam Updated Vital Signs BP (!) 146/99    Pulse (!) 121    Temp 99.8 F (37.7 C) (Oral)    Ht 6\' 1"  (1.854 m)    Wt 93.4 kg    SpO2 93%    BMI 27.18 kg/m   Physical Exam Vitals signs and nursing note reviewed.  Constitutional:      Appearance: He is well-developed.  Comments: Warm to touch, clammy  HENT:     Head: Normocephalic and atraumatic.  Eyes:     Conjunctiva/sclera: Conjunctivae normal.     Pupils: Pupils are equal, round, and reactive to light.  Neck:     Musculoskeletal: Normal range of motion.  Cardiovascular:     Rate and Rhythm: Normal rate and regular rhythm.     Heart sounds: Normal heart sounds.  Pulmonary:     Effort: Pulmonary effort is normal.     Breath sounds: Rhonchi present. No wheezing.     Comments: NRB mask in place, O2 sats around 92-93%, patient talking in short sentences, wet cough noted, rhonchorous breath sounds throughout Abdominal:     General: Bowel sounds are normal.     Palpations: Abdomen is soft.  Musculoskeletal: Normal range of motion.  Skin:    General: Skin is warm and dry.  Neurological:     Mental Status: He is alert and oriented to person, place, and time.      ED Treatments / Results  Labs (all labs ordered are listed, but only abnormal results are displayed) Labs Reviewed  CBC WITH DIFFERENTIAL/PLATELET - Abnormal; Notable for the following components:      Result Value   WBC 15.9 (*)    RDW 15.9 (*)    Neutro Abs 14.3 (*)    Lymphs Abs 0.4 (*)    Abs Immature Granulocytes 0.20 (*)    All other components within normal limits   COMPREHENSIVE METABOLIC PANEL - Abnormal; Notable for the following components:   CO2 21 (*)    Glucose, Bld 105 (*)    Creatinine, Ser 1.45 (*)    GFR calc non Af Amer 47 (*)    GFR calc Af Amer 55 (*)    All other components within normal limits  PROTIME-INR - Abnormal; Notable for the following components:   Prothrombin Time 19.3 (*)    INR 1.7 (*)    All other components within normal limits  LACTIC ACID, PLASMA - Abnormal; Notable for the following components:   Lactic Acid, Venous 2.8 (*)    All other components within normal limits  BLOOD GAS, ARTERIAL - Abnormal; Notable for the following components:   Bicarbonate 19.8 (*)    Acid-base deficit 4.1 (*)    All other components within normal limits  TROPONIN I (HIGH SENSITIVITY) - Abnormal; Notable for the following components:   Troponin I (High Sensitivity) 148 (*)    All other components within normal limits  SARS CORONAVIRUS 2 BY RT PCR (Zhang ORDER, PERFORMED IN [facility])  CULTURE, BLOOD (ROUTINE X 2)  CULTURE, BLOOD (ROUTINE X 2)    EKG EKG Interpretation  Date/Time:  Monday August 06 2019 04:23:21 EDT Ventricular Rate:  115 PR Interval:    QRS Duration: 87 QT Interval:  327 QTC Calculation: 453 R Axis:   -78 Text Interpretation:  Sinus tachycardia Consider right atrial enlargement Anterolateral infarct, age indeterminate Artifact in lead(s) I III aVR aVL No significant change since last tracing Confirmed by Marily Memos [phone]) on 08/06/2019 5:50:01 AM   Radiology Dg Chest Port 1 View  Result Date: 08/06/2019 CLINICAL DATA:  Cough, fever, shortness of breath. EXAM: PORTABLE CHEST 1 VIEW COMPARISON:  Radiograph 03/05/2018 FINDINGS: Patchy and ground-glass consolidation in the left mid and lower lung zone. Additional patchy opacity in the periphery of the right mid lung. Heart is normal in size with unchanged mediastinal contours. Aortic atherosclerosis. No pulmonary edema. No  large pleural  effusion. No pneumothorax. No acute osseous abnormalities are seen. IMPRESSION: 1. Patchy and ground-glass opacities in the left mid and lower lung zone and periphery of the right mid lung, suspicious for multifocal pneumonia. Followup PA and lateral chest X-ray is recommended in 3-4 weeks following trial of antibiotic therapy to ensure resolution and exclude underlying malignancy. 2.  Aortic Atherosclerosis (ICD10-I70.0). Electronically Signed   By: Narda RutherfordMelanie  Sanford M.D.   On: 08/06/2019 04:15    Procedures Procedures (including critical care time)  CRITICAL CARE Performed by: Garlon HatchetLisa M Tyleah Loh   Total critical care time: 45 minutes  Critical care time was exclusive of separately billable procedures and treating other patients.  Critical care was necessary to treat or prevent imminent or life-threatening deterioration.  Critical care was time spent personally by me on the following activities: development of treatment plan with patient and/or surrogate as well as nursing, discussions with consultants, evaluation of patient's response to treatment, examination of patient, obtaining history from patient or surrogate, ordering and performing treatments and interventions, ordering and review of laboratory studies, ordering and review of radiographic studies, pulse oximetry and re-evaluation of patient's condition.   Medications Ordered in ED Medications - No data to display   Initial Impression / Assessment and Plan / ED Course  I have reviewed the triage vital signs and the nursing notes.  Pertinent labs & imaging results that were available during my care of the patient were reviewed by me and considered in my medical decision making (see chart for details).  73 year old male here with shortness of breath.  Has been feeling this way for a few days but got acutely worse last night around 11 PM.  Has had a wet cough but not able to produce any phlegm.  He denies any sick contacts or known Covid  exposures.  He is febrile here to 102.86F rectally, he does feel warm and clammy.  Lungs with rhonchorous breath sounds bilaterally.  Currently on nonrebreather and saturating well.  He is able to talk in short sentences.  Reports this feels similar to prior bouts of pneumonia.  He has not been on any recent antibiotics.  Work-up pending including labs, ABG, portable chest x-ray.  Given patient's hefty oxygen requirement currently, may ultimately need BiPAP therefore rapid Covid test was sent.  4:37 AM ABG reassuring.  Chest x-ray with findings of multifocal pneumonia.  Patient started on broad-spectrum vancomycin and cefepime.  Slight dip in BP, IVFB started.  White blood cell count 15,000.  Lactate 2.8.  Troponin is mildly elevated, however no acute ischemic changes noted on EKG. Covid test is pending.  Patient remains stable after starting IVF and abx.  5:48 AM Covid is negative.  Patient reassessed and appears much more comfortable.  I was able to wean him down to 6 L and maintaining sats of 100%.  May be able to wean further.  We have discussed results of his labs and chest x-ray.  He will require admission given his ongoing oxygen requirement.  Patient is agreeable and comfortable with care plan.  Discussed with Dr. Antionette Charpyd-- he will admit for ongoing care.  Final Clinical Impressions(s) / ED Diagnoses   Final diagnoses:  Multifocal pneumonia  Acute respiratory failure with hypoxia [facility](HCC)    ED Discharge Orders    None       Garlon HatchetSanders, Jaysa Kise M, PA-C 08/06/19 2225    Mesner, Barbara CowerJason, MD 08/07/19 [phone]

## 2019-08-07 LAB — BASIC METABOLIC PANEL
Anion gap: 10 (ref 5–15)
BUN: 26 mg/dL — ABNORMAL HIGH (ref 8–23)
CO2: 18 mmol/L — ABNORMAL LOW (ref 22–32)
Calcium: 7.6 mg/dL — ABNORMAL LOW (ref 8.9–10.3)
Chloride: 108 mmol/L (ref 98–111)
Creatinine, Ser: 1.24 mg/dL (ref 0.61–1.24)
GFR calc Af Amer: >60 mL/min (ref >60)
GFR calc non Af Amer: 57 mL/min — ABNORMAL LOW (ref >60)
Glucose, Bld: 94 mg/dL (ref 70–99)
Potassium: 3.8 mmol/L (ref 3.5–5.1)
Sodium: 136 mmol/L (ref 135–145)

## 2019-08-07 LAB — EXPECTORATED SPUTUM ASSESSMENT W GRAM STAIN, RFLX TO RESP C

## 2019-08-07 LAB — PROCALCITONIN: Procalcitonin: 9.91 ng/mL

## 2019-08-07 LAB — LIPID PANEL
Cholesterol: 71 mg/dL (ref 0–200)
HDL: 27 mg/dL — ABNORMAL LOW (ref >40)
LDL Cholesterol: 20 mg/dL (ref 0–99)
Total CHOL/HDL Ratio: 2.6 RATIO
Triglycerides: 119 mg/dL (ref <150)
VLDL: 24 mg/dL (ref 0–40)

## 2019-08-07 LAB — CBC
HCT: 37.5 % — ABNORMAL LOW (ref 39.0–52.0)
Hemoglobin: 12.1 g/dL — ABNORMAL LOW (ref 13.0–17.0)
MCH: 31.3 pg (ref 26.0–34.0)
MCHC: 32.3 g/dL (ref 30.0–36.0)
MCV: 97.2 fL (ref 80.0–100.0)
Platelets: 180 10*3/uL (ref 150–400)
RBC: 3.86 MIL/uL — ABNORMAL LOW (ref 4.22–5.81)
RDW: 15.9 % — ABNORMAL HIGH (ref 11.5–15.5)
WBC: 13.5 10*3/uL — ABNORMAL HIGH (ref 4.0–10.5)
nRBC: 0 % (ref 0.0–0.2)

## 2019-08-07 LAB — URINE CULTURE: Culture: <10000 — AB

## 2019-08-07 LAB — HEMOGLOBIN A1C
Hgb A1c MFr Bld: 5 % (ref 4.8–5.6)
Mean Plasma Glucose: 96.8 mg/dL

## 2019-08-07 LAB — MRSA PCR SCREENING: MRSA by PCR: NEGATIVE

## 2019-08-07 LAB — HEPARIN LEVEL (UNFRACTIONATED)
Heparin Unfractionated: 0.21 IU/mL — ABNORMAL LOW (ref 0.30–0.70)
Heparin Unfractionated: 0.17 IU/mL — ABNORMAL LOW (ref 0.30–0.70)

## 2019-08-07 MED ORDER — ORAL CARE MOUTH RINSE
15.0000 mL | Freq: Two times a day (BID) | OROMUCOSAL | Status: DC
  Administered 2019-08-07 – 2019-08-14 (×13): 15 mL via OROMUCOSAL

## 2019-08-07 MED ORDER — WARFARIN SODIUM 6 MG PO TABS
6.0000 mg | ORAL_TABLET | Freq: Once | ORAL | Status: AC
  Administered 2019-08-07: 6 mg via ORAL
  Filled 2019-08-07: qty 1

## 2019-08-07 MED ORDER — HEPARIN BOLUS VIA INFUSION
3000.0000 [IU] | Freq: Once | INTRAVENOUS | Status: AC
  Administered 2019-08-07: 3000 [IU] via INTRAVENOUS
  Filled 2019-08-07: qty 3000

## 2019-08-07 MED ORDER — IPRATROPIUM-ALBUTEROL 0.5-2.5 (3) MG/3ML IN SOLN
3.0000 mL | Freq: Four times a day (QID) | RESPIRATORY_TRACT | Status: DC
  Administered 2019-08-07 – 2019-08-13 (×23): 3 mL via RESPIRATORY_TRACT
  Filled 2019-08-07 (×22): qty 3

## 2019-08-07 MED ORDER — HEPARIN (PORCINE) 25000 UT/250ML-% IV SOLN
2800.0000 [IU]/h | INTRAVENOUS | Status: DC
  Administered 2019-08-07: 2150 [IU]/h via INTRAVENOUS
  Administered 2019-08-08 – 2019-08-09 (×2): 2500 [IU]/h via INTRAVENOUS
  Administered 2019-08-09 – 2019-08-10 (×3): 2800 [IU]/h via INTRAVENOUS
  Filled 2019-08-07 (×5): qty 250

## 2019-08-07 MED ORDER — WARFARIN - PHARMACIST DOSING INPATIENT
Freq: Every day | Status: DC
  Administered 2019-08-08 – 2019-08-09 (×2)

## 2019-08-07 NOTE — Progress Notes (Signed)
ANTICOAGULATION CONSULT NOTE -   Pharmacy Consult for heparin/coumadin Indication: VTE treatment  Allergies  Allergen Reactions  . Codeine Hives and Other (See Comments)    Takes benadryl to stop reaction    Patient Measurements: Height: 6\' 1"  (185.4 cm) Weight: 208 lb 12.4 oz (94.7 kg) IBW/kg (Calculated) : 79.9 Heparin Dosing Weight:   Vital Signs: Temp: 98.5 F (36.9 C) (10/20 1159) Temp Source: Oral (10/20 1159) BP: 120/45 (10/20 1507) Pulse Rate: 101 (10/20 1507)  Labs: Recent Labs    08/06/19 0430 08/06/19 0839 08/06/19 1106 08/06/19 2006 08/07/19 0522 08/07/19 1627  HGB 16.9  --   --   --  12.1*  --   HCT 50.6  --   --   --  37.5*  --   PLT 307  --   --   --  180  --   LABPROT 19.3*  --   --   --   --   --   INR 1.7*  --   --   --   --   --   HEPARINUNFRC  --   --   --  <0.10* 0.21* 0.17*  CREATININE 1.45*  --   --   --  1.24  --   TROPONINIHS 148* 269* 254*  --   --   --     Estimated Creatinine Clearance: 59.1 mL/min (by C-G formula based on SCr of 1.24 mg/dL).   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  Pharmacy consulted to dose heparin, coumadin resumed 10/20.   PTA coumadin 4mg  po daily, last dose 10/18 at 1600.  08/07/2019 Heparin level remains low at 0.17 after rate increased from 1700 units/hr to 1900 units/hr Per discussion with RN no interruptions with infusion, pt coughed up very small amount of BRB x 1 (likely 2nd PNA) Hg 16.9> 12.1, PLTC 307> 180. Large drop in Hg.    Goal of Therapy:  Heparin level 0.3-0.7 units/ml Monitor platelets by anticoagulation protocol   Plan:  Heparin bolus 3000 units IV x 1  Increase heparin to 2150 units/hr Heparin level in 8 hours Coumadin  6 mg po x 1 dose Daily CBC, HL, INR  Eudelia Bunch,  Pharm.D [phone] 08/07/2019 4:50 PM

## 2019-08-07 NOTE — Progress Notes (Signed)
PHARMACY - PHYSICIAN COMMUNICATION CRITICAL VALUE ALERT - BLOOD CULTURE IDENTIFICATION (BCID)  Tony Zhang is an 73 y.o. male who presented to [facility] on 08/06/2019 with a chief complaint of SOB  Assessment:  Patient with blood culture result of 1 out of 4 bottles GPC on gram stain.  BCID not run due to lack of reagents.  (include suspected source if known)  Name of physician (or Provider) Contacted: K. Schorr  Current antibiotics: Vancomycin, cefepime   Changes to prescribed antibiotics recommended:  No changes needed at this time due to lack of data.  Night coverage agrees.  No results found for this or any previous visit.  [name] 08/07/2019  5:21 AM
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
## 2019-08-07 NOTE — Progress Notes (Signed)
ANTICOAGULATION CONSULT NOTE - Follow Up Consult  Pharmacy Consult for Heparin Indication: VTE Treatment  Allergies  Allergen Reactions  . Codeine Hives and Other (See Comments)    Takes benadryl to stop reaction    Patient Measurements: Height: 6\' 1"  (185.4 cm) Weight: 206 lb (93.4 kg) IBW/kg (Calculated) : 79.9 Heparin Dosing Weight:   Vital Signs: Temp: 98.4 F (36.9 C) (10/19 2356) Temp Source: Oral (10/19 2356) BP: 143/33 (10/20 0300) Pulse Rate: 81 (10/20 0300)  Labs: Recent Labs    08/06/19 0430 08/06/19 0839 08/06/19 1106 08/06/19 2006  HGB 16.9  --   --   --   HCT 50.6  --   --   --   PLT 307  --   --   --   LABPROT 19.3*  --   --   --   INR 1.7*  --   --   --   HEPARINUNFRC  --   --   --  <0.10*  CREATININE 1.45*  --   --   --   TROPONINIHS 148* 269* 254*  --     Estimated Creatinine Clearance: 50.5 mL/min (A) (by C-G formula based on SCr of 1.45 mg/dL (H)).   Medications:  Infusions:  . ceFEPime (MAXIPIME) IV Stopped (08/06/19 1812)  . heparin 1,700 Units/hr (08/07/19 0227)  . norepinephrine (LEVOPHED) Adult infusion 2 mcg/min (08/07/19 0219)  . vancomycin      Assessment: Patient with low heparin level.  No heparin issues per RN.  Goal of Therapy:  Heparin level 0.3-0.7 units/ml Monitor platelets by anticoagulation protocol: Yes   Plan:  Increase heparin to 1700 units/hr Recheck level with AM labs  Tyler Deis, Shea Stakes Crowford 08/07/2019,3:49 AM
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
## 2019-08-07 NOTE — Progress Notes (Signed)
NAME:  Tony Zhang, MRN:  [id number], DOB:  [birth year], LOS: 1 ADMISSION DATE:  08/06/2019, CONSULTATION DATE: 08/06/2019 REFERRING MD: Dr. Horris Latino, CHIEF COMPLAINT: Sepsis  Brief History   73 year old gentleman admitted for left lower lobe pneumonia sepsis.  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 Servando Snare.  Presents to the emergency department with complaints of shortness of breath cough and congestion.  Patient was found to be hypoxic febrile.  Hypotensive with a systolic blood pressure in the 90s.  Patient was found to have an elevated white blood cell count of 15.9 lactic acidosis of 2.8 elevated serum creatinine 1.45.  Chest x-ray with left lower lobe infiltrate consistent with pneumonia.  Past Medical History   Past Medical History:  Diagnosis Date  . AAA (abdominal aortic aneurysm) ([place])   . Blood dyscrasia    followed by Dr. Benay Spice, for clotting issue, has been on Coumadin for about 20 yrs.    [name] COPD (chronic obstructive pulmonary disease) ([place])   . DJD (degenerative joint disease)   . Dyslipidemia   . Gout   . Malignant hypertension   . Peripheral vascular disease ([place])   . Pneumonia 08/2013   pt. reports that he is having this surgery 11/25/2014- for the lung problem that began with pneumonia in 09/2014  . Shortness of breath   . Venous thrombosis    Recurrent     Significant Hospital Events   ICU admission   Consults:  PCCM   Procedures:  08/06/2019: Bedside ultrasound the left chest evidence of consolidated lung, very small rim of pleural fluid no indication for thoracentesis likely reactive from left-sided lower lobe pneumonia.  Significant Diagnostic Tests:  Procalcitonin: 15  Micro Data:  COVID-19: Negative  Antimicrobials:  Cefepime Vancomycin   Interim history/subjective:  Off pressors this am.  Did not require CVL placement.   Objective   Blood pressure (!) 130/100, pulse (!) 107, temperature 98.5 F (36.9 C), temperature source Oral, resp. rate (!) 21, height 6\' 1"  (1.854 m), weight 94.7 kg, SpO2 99 %.        Intake/Output Summary (Last 24 hours) at 08/07/2019 0902 Last data filed at 08/07/2019 6010 Gross per 24 hour  Intake 2369.93 ml  Output 1250 ml  Net 1119.93 ml   Filed Weights   08/06/19 0352 08/07/19 0500  Weight: 93.4 kg 94.7 kg    Examination: General: Elderly male resting in bed, NAD  HENT: NCAT, sclera clear following commands Lungs: resps even non labored on [place],  Cardiovascular: Regular rate and rhythm Abdomen: Soft nontender nondistended Extremities: No edema Neuro: Awake alert following commands no focal deficit   Resolved Hospital Problem list     Assessment & Plan:   Septic shock - resolved  Community-acquired pneumonia Left lower lobe pneumonia Acute hypoxemic respiratory failure requiring nasal cannula O2 supplementation Left-sided pleuritic chest pain, bedside ultrasound with very small amount of pleural fluid, no obvious loculated effusion or need for thoracentesis PLAN -  Continue abx cefepime/vanc  Follow culture data  Continue gentle fluids  Monitor off pressors - goal MAP >65 Mobilize  Aggressive pulmonary hygiene  Trend pct   Acute renal failure - improved  -Secondary to above, probable mixed sepsis plus prerenal state poor p.o. intake for several days   Elevated serum troponin History of CAD, PAD History of chronic systolic  and diastolic heart failure -Suspect related to sepsis syndrome above. -Holding home blood pressure medications and beta-blockade.  Positive D-dimer History of VTE Subtherapeutic INR PLAN -  Dopplers pending  CTA pending  Heparin per pharmacy, can likely resume coumadin if remains off pressors x 24 hrs    PCCM signing off, please call back  if needed.     Labs   CBC: Recent Labs  Lab 08/06/19 0430 08/07/19 0522  WBC 15.9* 13.5*  NEUTROABS 14.3*  --   HGB 16.9 12.1*  HCT 50.6 37.5*  MCV 93.2 97.2  PLT 307 180    Basic Metabolic Panel: Recent Labs  Lab 08/06/19 0430 08/07/19 0522  NA 136 136  K 4.0 3.8  CL 103 108  CO2 21* 18*  GLUCOSE 105* 94  BUN 20 26*  CREATININE 1.45* 1.24  CALCIUM 9.4 7.6*   GFR: Estimated Creatinine Clearance: 59.1 mL/min (by C-G formula based on SCr of 1.24 mg/dL). Recent Labs  Lab 08/06/19 0430 08/06/19 0839 08/06/19 1106 08/07/19 0522  PROCALCITON  --  15.48  --  9.91  WBC 15.9*  --   --  13.5*  LATICACIDVEN 2.8* 1.8 2.1*  --     Liver Function Tests: Recent Labs  Lab 08/06/19 0430  AST 21  ALT 11  ALKPHOS 71  BILITOT 1.1  PROT 7.4  ALBUMIN 4.0   No results for input(s): LIPASE, AMYLASE in the last 168 hours. No results for input(s): AMMONIA in the last 168 hours.  ABG    Component Value Date/Time   PHART 7.362 08/06/2019 0405   PCO2ART 36.2 08/06/2019 0405   PO2ART 85.2 08/06/2019 0405   HCO3 19.8 (L) 08/06/2019 0405   TCO2 21 (L) 11/03/2018 1441   ACIDBASEDEF 4.1 (H) 08/06/2019 0405   O2SAT 95.3 08/06/2019 0405     Coagulation Profile: Recent Labs  Lab 08/06/19 0430  INR 1.7*    Cardiac Enzymes: No results for input(s): CKTOTAL, CKMB, CKMBINDEX, TROPONINI in the last 168 hours.  HbA1C: Hgb A1c MFr Bld  Date/Time Value Ref Range Status  08/07/2019 05:22 AM 5.0 4.8 - 5.6 % Final    Comment:    (NOTE) Pre diabetes:          5.7%-6.4% Diabetes:              >6.4% Glycemic control for   <7.0% adults with diabetes     CBG: No results for input(s): GLUCAP in the last 168 hours.   Dirk Dress, NP 08/07/2019  9:02 AM Pager: [phone] or [phone]

## 2019-08-07 NOTE — Progress Notes (Signed)
Progress Note  Patient Name: Tony Zhang Date of Encounter: 08/07/2019  Primary Cardiologist: Thurmon Fair, MD   Subjective   Patient was hypotensive overnight briefly requiring Levophed but is not off pressors. Patient still having pleuritic chest pain, shortness of breath, and fatigue today. Productive cough seems to have improved some today but is still present. Still on 5 L of O2 via nasal cannula.  Inpatient Medications    Scheduled Meds:  allopurinol  300 mg Oral Daily   Chlorhexidine Gluconate Cloth  6 each Topical Daily   cilostazol  100 mg Oral BID   diazepam  2 mg Oral QPM   guaiFENesin  600 mg Oral BID   HYDROcodone-acetaminophen  1 tablet Oral Q4H   ipratropium-albuterol  3 mL Nebulization QID   mouth rinse  15 mL Mouth Rinse BID   pantoprazole  40 mg Oral BID   simvastatin  40 mg Oral q1800   Continuous Infusions:  ceFEPime (MAXIPIME) IV Stopped (08/07/19 0609)   heparin 1,900 Units/hr (08/07/19 0821)   vancomycin     PRN Meds: acetaminophen **OR** acetaminophen, cyclobenzaprine, levalbuterol, ondansetron **OR** ondansetron (ZOFRAN) IV, senna-docusate   Vital Signs    Vitals:   08/07/19 0815 08/07/19 0830 08/07/19 0900 08/07/19 1159  BP: (!) 130/100     Pulse: 94 (!) 107 (!) 111 (!) 107  Resp: (!) 26 (!) 21 (!) 26 (!) 24  Temp:  98.5 F (36.9 C)    TempSrc:  Oral    SpO2: 98% 99% 98% 97%  Weight:      Height:        Intake/Output Summary (Last 24 hours) at 08/07/2019 1213 Last data filed at 08/07/2019 0821 Gross per 24 hour  Intake 1074.48 ml  Output 1550 ml  Net -475.52 ml   Last 3 Weights 08/07/2019 08/06/2019 06/26/2019  Weight (lbs) 208 lb 12.4 oz 206 lb 206 lb 4 oz  Weight (kg) 94.7 kg 93.441 kg 93.554 kg      Telemetry    Sinus rhythm with rates in the 80's to 110's. - Personally Reviewed  ECG    No new ECG tracing today. - Personally Reviewed  Physical Exam   GEN: 73 year old Caucasian male in no acute  distress. Neck: Supple. Cardiac: Mildly tachycardic with regular rhythm. Distinct S1 and S2. II/VI systolic murmur best heard at upper sternal border. No rubs or gallops. Left-sided chest wall tenderness to palpation. Radial pulses 2+ and equal bilaterally. Respiratory: Mildly tachypneic. Decreased breath sounds on left with crackles in left base. GI: Soft, non-tender, non-distended  MS: Trace lower extremity edema; No deformity. Neuro:  No focal deficits.  Psych: Normal affect. Responds appropriately.  Labs    High Sensitivity Troponin:   Recent Labs  Lab 08/06/19 0430 08/06/19 0839 08/06/19 1106  TROPONINIHS 148* 269* 254*      Chemistry Recent Labs  Lab 08/06/19 0430 08/07/19 0522  NA 136 136  K 4.0 3.8  CL 103 108  CO2 21* 18*  GLUCOSE 105* 94  BUN 20 26*  CREATININE 1.45* 1.24  CALCIUM 9.4 7.6*  PROT 7.4  --   ALBUMIN 4.0  --   AST 21  --   ALT 11  --   ALKPHOS 71  --   BILITOT 1.1  --   GFRNONAA 47* 57*  GFRAA 55* >60  ANIONGAP 12 10     Hematology Recent Labs  Lab 08/06/19 0430 08/07/19 0522  WBC 15.9* 13.5*  RBC 5.43  3.86*  HGB 16.9 12.1*  HCT 50.6 37.5*  MCV 93.2 97.2  MCH 31.1 31.3  MCHC 33.4 32.3  RDW 15.9* 15.9*  PLT 307 180    BNP Recent Labs  Lab 08/06/19 0430  BNP 99.5     DDimer  Recent Labs  Lab 08/06/19 0430  DDIMER 11.28*     Radiology    Dg Chest Port 1 View  Result Date: 08/06/2019 CLINICAL DATA:  Cough, fever, shortness of breath. EXAM: PORTABLE CHEST 1 VIEW COMPARISON:  Radiograph 03/05/2018 FINDINGS: Patchy and ground-glass consolidation in the left mid and lower lung zone. Additional patchy opacity in the periphery of the right mid lung. Heart is normal in size with unchanged mediastinal contours. Aortic atherosclerosis. No pulmonary edema. No large pleural effusion. No pneumothorax. No acute osseous abnormalities are seen. IMPRESSION: 1. Patchy and ground-glass opacities in the left mid and lower lung zone and  periphery of the right mid lung, suspicious for multifocal pneumonia. Followup PA and lateral chest X-ray is recommended in 3-4 weeks following trial of antibiotic therapy to ensure resolution and exclude underlying malignancy. 2.  Aortic Atherosclerosis (ICD10-I70.0). Electronically Signed   By: Narda Rutherford M.D.   On: 08/06/2019 04:15   Vas [place] Lower Extremity Venous (dvt)  Result Date: 08/07/2019  Lower Venous Study Indications: Swelling.  Limitations: Posterior acoustic shadowing secondary to arterial calcification. Comparison Study: No prior study. Performing Technologist: Gertie Fey MHA, RDMS, RVT, RDCS  Examination Guidelines: A complete evaluation includes B-mode imaging, spectral Doppler, color Doppler, and power Doppler as needed of all accessible portions of each vessel. Bilateral testing is considered an integral part of a complete examination. Limited examinations for reoccurring indications may be performed as noted.  +---------+---------------+---------+-----------+----------+--------------+  RIGHT     Compressibility Phasicity Spontaneity Properties Thrombus Aging  +---------+---------------+---------+-----------+----------+--------------+  CFV       Full            Yes       Yes                                    +---------+---------------+---------+-----------+----------+--------------+  SFJ       Full                                                             +---------+---------------+---------+-----------+----------+--------------+  FV Prox   Full                                                             +---------+---------------+---------+-----------+----------+--------------+  FV Mid    Full                                                             +---------+---------------+---------+-----------+----------+--------------+  FV Distal Full                                                             +---------+---------------+---------+-----------+----------+--------------+  PFV       Full                                                             +---------+---------------+---------+-----------+----------+--------------+  POP       Full            Yes       Yes                                    +---------+---------------+---------+-----------+----------+--------------+  PTV       Full                                                             +---------+---------------+---------+-----------+----------+--------------+  PERO      Full                                                             +---------+---------------+---------+-----------+----------+--------------+   +---------+---------------+---------+-----------+----------+--------------+  LEFT      Compressibility Phasicity Spontaneity Properties Thrombus Aging  +---------+---------------+---------+-----------+----------+--------------+  CFV       Full            Yes       Yes                                    +---------+---------------+---------+-----------+----------+--------------+  SFJ       Full                                                             +---------+---------------+---------+-----------+----------+--------------+  FV Prox   Full                                                             +---------+---------------+---------+-----------+----------+--------------+  FV Mid    Full                                                             +---------+---------------+---------+-----------+----------+--------------+  FV Distal Full                                                             +---------+---------------+---------+-----------+----------+--------------+  PFV       Full                                                             +---------+---------------+---------+-----------+----------+--------------+  POP       Full            Yes       Yes                                    +---------+---------------+---------+-----------+----------+--------------+  PTV       Full                                                              +---------+---------------+---------+-----------+----------+--------------+  PERO      Full                                                             +---------+---------------+---------+-----------+----------+--------------+  Summary: Right: There is no evidence of deep vein thrombosis in the lower extremity. No cystic structure found in the popliteal fossa. Left: There is no evidence of deep vein thrombosis in the lower extremity. No cystic structure found in the popliteal fossa.  *See table(s) above for measurements and observations. Electronically signed by Ruta Hinds MD on 08/07/2019 at 11:22:06 AM.    Final     Cardiac Studies   Echocardiogram 07/09/2019: Impressions: 1. Left ventricular ejection fraction, by visual estimation, is 60 to 65%. The left ventricle has normal function. Normal left ventricular size. There is no left ventricular hypertrophy. 2. The tricuspid valve is normal in structure. Tricuspid valve regurgitation is trivial. 3. The aortic valve is tricuspid Aortic valve regurgitation was not visualized by color flow Doppler. Moderate aortic valve stenosis. Mean gradient 19 mmHg wtih AVA 1.15 cm^2. 4. Left ventricular diastolic Doppler parameters are consistent with impaired relaxation pattern of LV diastolic filling. 5. Global right ventricle has normal systolic function.The right ventricular size is normal. No increase in right ventricular wall thickness. 6. Right atrial size was normal. 7. Left atrial size was normal. 8. The inferior vena cava is normal in size with greater than 50% respiratory variability, suggesting right atrial pressure of 3 mmHg. 9. The mitral valve is normal in structure. No evidence of mitral valve regurgitation. No evidence of mitral stenosis. 10. The tricuspid regurgitant velocity is 2.90 m/s, and with an assumed right atrial pressure of 3 mmHg, the estimated right ventricular systolic pressure is mildly elevated  at 36.6 mmHg.  Patient Profile     Tony Zhang is a 73 y.o. 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 Sharolyn DouglasEzenduka.  Assessment & Plan    Elevated Troponin Likely Due to Demand Ischemia - Patient admitted with sepsis secondary to pneumonia after presenting with shortness of breath and was found to have elevated troponin. - High-sensitivity troponin elevated and flat at 148 >> 269 >> 254. - BNP normal. - EKG shows no acute ST/T changes. - Most recent Echo from 07/09/2019 showed LVEF of  60-65% (improved from 40-45% in 10/2018 during acute illness). - Still complaining of left sided pleuritic chest pain with deep breaths. - Most likely demand ischemia in the setting of sepsis secondary to left sided pneumonia.  - No ischemic evaluation planned at this time. However, patient would likely benefit from ischemic evaluation as an outpatient when he recovers from acute illness.   Suspected CAD - Patient suspected to have CAD with possible occlusion RCA based on nuclear stress test in 2014 (unable to personally see these results). However, decision was made to treat medically given no angina. - LDL 20. - Hemoglobin A1c 5.0. - Continue medical treatment at this time. No Aspirin due to Coumadin. Continue statin. Can restart beta-blocker when BP improves.  Severe PAD - Patient has severe PAD with occlusion of terminal abdominal aorta and bilateral renal artery stenosis. - Patient denies any recent claudication. - Continue 100mg  twice daily.  Aortic Stenosis - Most recent Echo from 07/09/2019 showed moderate aortic stenosis with  mean gradient of 19mmHg and AVA of 1.15cm2.  - Can follow as outpatient.    Sinus Tachycardia - Physiologic response to sepsis/pneumonia. - Continue to monitor.  Sepsis Secondary to Multifocal Pneumonia - COVID-19 negative. - Cultures pending. Came back positive for gram positive cocci but full report pending. - Currently on Vancomycin and Cefepime.  - Management per primary team.  Hypotensive - BP has improved and patient has been weaned off Levophed. Most recent BP during my evaluation with him was 118/89. - Patient has been more hypertensive today. If BP remains elevated, could start to slowly add back home medications.  Recurrent VTE with Elevated D-Dimer - INR subtherapeutic at 1.7 on admission. Per patient, blood has been "thick" lately. - D-Dimer significantly elevated at 11.28. Could consider chest CTA to rule out recurrent PE but will defer to primary team as patient is already on Coumadin. - Continue Coumadin with Heparin bridge per Pharmacy.  AKI - Creatinine 1.45 on admission. Baseline 1.1 to 1.2 range. - Improved today at 1.24.  - Continue to monitor.  For questions or updates, please contact [facility] Please consult [email or web address] for contact info under        Signed, Corrin ParkerCallie E Christol Thetford, PA-C  08/07/2019, 12:13 PM

## 2019-08-07 NOTE — Consult Note (Signed)
NAME:  Tony Zhang, MRN:  [id number], DOB:  1946-09-29, LOS: 1 ADMISSION DATE:  08/06/2019, CONSULTATION DATE: 08/06/2019 REFERRING MD: Dr. Sharolyn Douglas, CHIEF COMPLAINT: Sepsis  Brief History   73 year old gentleman admitted for left lower lobe pneumonia sepsis.  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 Tyrone Sage.  Presents to the emergency department with complaints of shortness of breath cough and congestion.  Patient was found to be hypoxic febrile.  Hypotensive with a systolic blood pressure in the 90s.  Patient was found to have an elevated white blood cell count of 15.9 lactic acidosis of 2.8 elevated serum creatinine 1.45.  Chest x-ray with left lower lobe infiltrate consistent with pneumonia.  Past Medical History   Past Medical History:  Diagnosis Date  . AAA (abdominal aortic aneurysm) (HCC)   . 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    [name] COPD (chronic obstructive pulmonary disease) (HCC)   . DJD (degenerative joint disease)   . Dyslipidemia   . Gout   . Malignant hypertension   . Peripheral vascular disease (HCC)   . Pneumonia 08/2013   pt. reports that he is having this surgery 11/25/2014- for the lung problem that began with pneumonia in 09/2014  . Shortness of breath   . Venous thrombosis    Recurrent     Significant Hospital Events   ICU admission   Consults:  PCCM   Procedures:  08/06/2019: Bedside ultrasound the left chest evidence of consolidated lung, very small rim of pleural fluid no indication for thoracentesis likely reactive from left-sided lower lobe pneumonia.  Significant Diagnostic Tests:  Procalcitonin: 15  Micro Data:  COVID-19 10/19 >>  Negative Blood 10/19 >> 1 of 2 GPC >>  Resp  10/19 >>  Urine 10/19 >>   Antimicrobials:  Cefepime 10/19 >>  Vancomycin 10/19 >>   Interim history/subjective:  Patient states for the past several days has had increasing fatigue cough shortness of breath.  He also complains of left-sided chest pain with deep inspiration.  Objective   Blood pressure (!) 130/100, pulse (!) 107, temperature 98.5 F (36.9 C), temperature source Oral, resp. rate (!) 21, height 6\' 1"  (1.854 m), weight 94.7 kg, SpO2 99 %.        Intake/Output Summary (Last 24 hours) at 08/07/2019 0849 Last data filed at 08/07/2019 0821 Gross per 24 hour  Intake 2369.93 ml  Output 1250 ml  Net 1119.93 ml   Filed Weights   08/06/19 0352 08/07/19 0500  Weight: 93.4 kg 94.7 kg    Examination: General: up to side of bed, comfortable.  HENT: Op clear, no lesions Lungs: L insp crackles. No wheezes Cardiovascular: regular, tachy 110, no M Abdomen: soft, non distended, + BS Extremities: no edema Neuro: awake, a bit tangential but oriented and appropriate. Moves all ext w good strength  Resolved Hospital Problem list   Shock  Assessment & Plan:   Septic shock, improved 10/20 LLL Community-acquired pneumonia GPC bacteremia Acute hypoxemic respiratory failure requiring nasal cannula O2 supplementation Left-sided pleuritic chest pain, bedside ultrasound with very small amount of pleural fluid 10/19, no obvious loculated effusion or need for thoracentesis -Continue current supportive care.  Norepinephrine has been weaned to off overnight. -Continue empiric antibiotics, vancomycin, cefepime pending culture data,  particularly blood cultures.  Note GPC -Continue to push aggressive pulmonary hygiene, I-S, flutter valve-up to chair -Follow intermittent chest x-ray -add duoneb scheduled, stop pulmicort  Acute renal failure -follow UOP, BMP -insure adequate renal perfusion  -renal dose vanco   Elevated serum troponin History of CAD, PAD History of chronic systolic  and diastolic heart failure -add back home BP meds as BP will tolerate  Positive D-dimer History of VTE Subtherapeutic INR -heparin gtt, back to coumadin when able.    Best practice:  Diet: Per primary Pain/Anxiety/Delirium protocol (if indicated): Not applicable VAP protocol (if indicated): Not applicable DVT prophylaxis: Heparin GI prophylaxis: PPI Glucose control: CBG Mobility: Up in chair Code Status: Full code Family Communication: Discussed with patient Disposition: SDU   Labs   CBC: Recent Labs  Lab 08/06/19 0430 08/07/19 0522  WBC 15.9* 13.5*  NEUTROABS 14.3*  --   HGB 16.9 12.1*  HCT 50.6 37.5*  MCV 93.2 97.2  PLT 307 267    Basic Metabolic Panel: Recent Labs  Lab 08/06/19 0430 08/07/19 0522  NA 136 136  K 4.0 3.8  CL 103 108  CO2 21* 18*  GLUCOSE 105* 94  BUN 20 26*  CREATININE 1.45* 1.24  CALCIUM 9.4 7.6*   GFR: Estimated Creatinine Clearance: 59.1 mL/min (by C-G formula based on SCr of 1.24 mg/dL). Recent Labs  Lab 08/06/19 0430 08/06/19 0839 08/06/19 1106 08/07/19 0522  PROCALCITON  --  15.48  --  9.91  WBC 15.9*  --   --  13.5*  LATICACIDVEN 2.8* 1.8 2.1*  --     Liver Function Tests: Recent Labs  Lab 08/06/19 0430  AST 21  ALT 11  ALKPHOS 71  BILITOT 1.1  PROT 7.4  ALBUMIN 4.0   No results for input(s): LIPASE, AMYLASE in the last 168 hours. No results for input(s): AMMONIA in the last 168 hours.  ABG    Component Value Date/Time   PHART 7.362 08/06/2019 0405   PCO2ART 36.2 08/06/2019 0405   PO2ART 85.2 08/06/2019 0405   HCO3 19.8 (L) 08/06/2019 0405   TCO2 21 (L) 11/03/2018 1441   ACIDBASEDEF 4.1 (H) 08/06/2019 0405   O2SAT 95.3 08/06/2019 0405     Coagulation Profile: Recent Labs  Lab 08/06/19 0430  INR 1.7*    Cardiac Enzymes: No results for input(s): CKTOTAL, CKMB, CKMBINDEX, TROPONINI in the last 168 hours.  HbA1C: Hgb A1c MFr Bld  Date/Time Value Ref Range Status  08/07/2019 05:22 AM 5.0 4.8 -  5.6 % Final    Comment:    (NOTE) Pre diabetes:          5.7%-6.4% Diabetes:              >6.4% Glycemic control for   <7.0% adults with diabetes    Please call if we can assist further.   Baltazar Apo, MD, PhD 08/07/2019, 9:14 AM [facility] [phone] or if no answer [phone]

## 2019-08-07 NOTE — Progress Notes (Signed)
0000 CPT via bed held at this time, RN attempting to place IV.

## 2019-08-07 NOTE — Progress Notes (Signed)
ANTICOAGULATION CONSULT NOTE -   Pharmacy Consult for heparin Indication: VTE treatment  Allergies  Allergen Reactions  . Codeine Hives and Other (See Comments)    Takes benadryl to stop reaction    Patient Measurements: Height: 6\' 1"  (185.4 cm) Weight: 208 lb 12.4 oz (94.7 kg) IBW/kg (Calculated) : 79.9 Heparin Dosing Weight:   Vital Signs: Temp: 98.5 F (36.9 C) (10/20 0400) Temp Source: Oral (10/20 0400) BP: 146/36 (10/20 0700) Pulse Rate: 95 (10/20 0700)  Labs: Recent Labs    08/06/19 0430 08/06/19 0839 08/06/19 1106 08/06/19 2006 08/07/19 0522  HGB 16.9  --   --   --  12.1*  HCT 50.6  --   --   --  37.5*  PLT 307  --   --   --  180  LABPROT 19.3*  --   --   --   --   INR 1.7*  --   --   --   --   HEPARINUNFRC  --   --   --  <0.10* 0.21*  CREATININE 1.45*  --   --   --  1.24  TROPONINIHS 148* 269* 254*  --   --     Estimated Creatinine Clearance: 59.1 mL/min (by C-G formula based on SCr of 1.24 mg/dL).   Medical History: Past Medical History:  Diagnosis Date  . AAA (abdominal aortic aneurysm) ([place])   . Blood dyscrasia    followed by Dr. Benay Spice, for clotting issue, has been on Coumadin for about 20 yrs.    [name] COPD (chronic obstructive pulmonary disease) ([place])   . DJD (degenerative joint disease)   . Dyslipidemia   . Gout   . Malignant hypertension   . Peripheral vascular disease ([place])   . Pneumonia 08/2013   pt. reports that he is having this surgery 11/25/2014- for the lung problem that began with pneumonia in 09/2014  . Shortness of breath   . 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  Pharmacy consulted to dose heparin. Pt takes coumadin 2mg  po daily, last dose unknown.  08/07/2019 INR 0.21, subtherapeutic on 1700 units/hr Hgb  12.1 Plts 180 Per discussion with RN, no bleeding or interruptions with infustion  Goal of Therapy:  Heparin level 0.3-0.7 units/ml Monitor platelets by anticoagulation protocol   Plan:  Increase heparin to 1900 units/hr Heparin level in 8 hours Daily CBC   Dolly Rias RPh 08/07/2019, 7:46 AM Pager [phone]

## 2019-08-07 NOTE — Progress Notes (Signed)
Pt on Heparin drip, coughed up very small amount of BRB x1

## 2019-08-07 NOTE — Progress Notes (Signed)
ANTICOAGULATION CONSULT NOTE - Initial Consult  Pharmacy Consult for coumadin Indication: VTE treatment  Allergies  Allergen Reactions  . Codeine Hives and Other (See Comments)    Takes benadryl to stop reaction    Patient Measurements: Height: 6\' 1"  (185.4 cm) Weight: 208 lb 12.4 oz (94.7 kg) IBW/kg (Calculated) : 79.9 Heparin Dosing Weight:   Vital Signs: Temp: 98.5 F (36.9 C) (10/20 1159) Temp Source: Oral (10/20 1159) BP: 120/45 (10/20 1507) Pulse Rate: 101 (10/20 1507)  Labs: Recent Labs    08/06/19 0430 08/06/19 0839 08/06/19 1106 08/06/19 2006 08/07/19 0522  HGB 16.9  --   --   --  12.1*  HCT 50.6  --   --   --  37.5*  PLT 307  --   --   --  180  LABPROT 19.3*  --   --   --   --   INR 1.7*  --   --   --   --   HEPARINUNFRC  --   --   --  <0.10* 0.21*  CREATININE 1.45*  --   --   --  1.24  TROPONINIHS 148* 269* 254*  --   --     Estimated Creatinine Clearance: 59.1 mL/min (by C-G formula based on SCr of 1.24 mg/dL).   Medical History: Past Medical History:  Diagnosis Date  . AAA (abdominal aortic aneurysm) (HCC)   . Blood dyscrasia    followed by Dr. 08/09/19, for clotting issue, has been on Coumadin for about 20 yrs.    Truett Perna COPD (chronic obstructive pulmonary disease) (HCC)   . DJD (degenerative joint disease)   . Dyslipidemia   . Gout   . Malignant hypertension   . Peripheral vascular disease (HCC)   . Pneumonia 08/2013   pt. reports that he is having this surgery 11/25/2014- for the lung problem that began with pneumonia in 09/2014  . Shortness of breath   . Venous thrombosis    Recurrent    Medications:  Medications Prior to Admission  Medication Sig Dispense Refill Last Dose  . acetaminophen (TYLENOL) 650 MG CR tablet Take 650 mg by mouth every 8 (eight) hours as needed for pain.   Past Week at Unknown time  . albuterol (VENTOLIN HFA) 108 (90 Base) MCG/ACT inhaler Inhale 2 puffs into the lungs every 6 (six) hours as needed for wheezing or  shortness of breath.   08/05/2019 at Unknown time  . allopurinol (ZYLOPRIM) 300 MG tablet Take 300 mg by mouth daily.   08/05/2019 at Unknown time  . cilostazol (PLETAL) 100 MG tablet Take 1 tablet (100 mg total) by mouth 2 (two) times daily. Please schedule appointment for refills. 180 tablet 0 08/05/2019 at Unknown time  . cyclobenzaprine (FLEXERIL) 10 MG tablet Take 10 mg by mouth 3 (three) times daily as needed for muscle spasms.   08/05/2019 at Unknown time  . diazepam (VALIUM) 2 MG tablet Take 2 mg by mouth every evening.   08/05/2019 at Unknown time  . HYDROcodone-acetaminophen (NORCO) 10-325 MG tablet Take 1 tablet by mouth every 4 (four) hours.   08/05/2019 at Unknown time  . lisinopril (ZESTRIL) 10 MG tablet Take 10 mg by mouth daily.   08/05/2019 at Unknown time  . Multiple Vitamin (MULTIVITAMIN WITH MINERALS) TABS tablet Take 1 tablet by mouth every 7 (seven) days. Mondays   07/23/2019  . nebivolol (BYSTOLIC) 10 MG tablet Take 1 tablet (10 mg total) by mouth daily. 30 tablet 0 08/05/2019  at 1500  . omeprazole (PRILOSEC) 40 MG capsule Take 40 mg by mouth daily.   08/05/2019 at Unknown time  . simvastatin (ZOCOR) 40 MG tablet Take 1 tablet (40 mg total) by mouth daily at 6 PM. Need appointment for refills 30 tablet 0 08/05/2019 at Unknown time  . tamsulosin (FLOMAX) 0.4 MG CAPS capsule Take 0.4 mg by mouth every evening.   08/05/2019 at Unknown time  . warfarin (COUMADIN) 2 MG tablet Take 1 tablet (2 mg total) by mouth daily. (Patient taking differently: Take 4 mg by mouth daily in the afternoon. Take 7 days a week) 30 tablet 0 08/05/2019 at 1600    Assessment: 73 yo M on coumadin PTA.  Pharmacy dosing heparin and now consulted to dose coumadin. PTA coumadin dose: 4 mg daily, last dose 10/18 at 1600.  INR was 1.7 on 10/19 admission- below goal.    Goal of Therapy:  INR 2-3 Monitor platelets by anticoagulation protocol: Yes   Plan:  Coumadin 6 mg po x 1 dose today Daily  INR  Eudelia Bunch, Pharm.D [phone] 08/07/2019 4:33 PM

## 2019-08-07 NOTE — Evaluation (Signed)
Physical Therapy Evaluation Patient Details Name: Tony Zhang MRN: [id number] DOB: 1945-10-29 Today's Date: 08/07/2019   History of Present Illness  73 yo male admitted to ED on 10/19 for PNA sespis. Pt with elevated troponin, now downtrending with cardiology documenting potential demand ischemia due to sepsis. PMH includes COPD, dyslipidemia, HTN, PVD, AAA, history of DVT and PE, bilateral renal artery stenosis, lumbar surgery.  Clinical Impression   Pt presents with LE weakness L>R, dyspnea on exertion, difficulty performing transfers/gait, decreased activity tolerance. Pt to benefit from acute PT to address deficits. Pt ambulated 5 ft in room with min assist for steadying, requiring rest after 5 ft due to fatigue and dizziness, BP 172/70. Pt with sats maintained >=96% on 4LO2, HR 107-117 bpm. PT recommending SNF level of care post-acutely given pt's mobility deficits,and lack of caregiver support. PT to progress mobility as tolerated, and will continue to follow acutely.      Follow Up Recommendations SNF;Supervision for mobility/OOB    Equipment Recommendations  Other (comment)(defer)    Recommendations for Other Services       Precautions / Restrictions Precautions Precautions: Fall Restrictions Weight Bearing Restrictions: No      Mobility  Bed Mobility Overal bed mobility: Needs Assistance Bed Mobility: Supine to Sit     Supine to sit: Supervision;HOB elevated     General bed mobility comments: supervision for safety, use of bed rails to come to sitting.  Transfers Overall transfer level: Needs assistance Equipment used: 1 person hand held assist Transfers: Sit to/from Stand Sit to Stand: Min assist;From elevated surface         General transfer comment: Min assist for steadying, initial power up. Verbal cuing for pushing up from bed with UEs.  Ambulation/Gait Ambulation/Gait assistance: Min assist;+2 safety/equipment Gait Distance (Feet): 5  Feet Assistive device: 1 person hand held assist Gait Pattern/deviations: Step-through pattern;Decreased stride length;Wide base of support Gait velocity: decr   General Gait Details: min assist for steadying, guiding pt safely to recliner. +2 for lines/leads. Pt with sats maintained >=96% on 4LO2, HR 107-117 bpm.  Stairs            Wheelchair Mobility    Modified Rankin (Stroke Patients Only)       Balance Overall balance assessment: Needs assistance;History of Falls Sitting-balance support: No upper extremity supported;Feet supported Sitting balance-Leahy Scale: Good     Standing balance support: Single extremity supported Standing balance-Leahy Scale: Poor Standing balance comment: quite unsteady, reliant on external support                             Pertinent Vitals/Pain Pain Assessment: 0-10 Pain Score: 7  Pain Location: L chest, due to PNA and coughing Pain Descriptors / Indicators: Sore;Discomfort Pain Intervention(s): Limited activity within patient's tolerance;Monitored during session;Premedicated before session;Repositioned    Home Living Family/patient expects to be discharged to:: Private residence Living Arrangements: Alone Available Help at Discharge: Other (Comment)(pt states none) Type of Home: Mobile home Home Access: Stairs to enter Entrance Stairs-Rails: Right;Left Entrance Stairs-Number of Steps: 4 Home Layout: One level Home Equipment: None Additional Comments: Pt does up stairs sideways due to LLE weakness since back sx.    Prior Function Level of Independence: Independent               Hand Dominance   Dominant Hand: Right    Extremity/Trunk Assessment   Upper Extremity Assessment Upper Extremity Assessment: Defer to OT evaluation  Lower Extremity Assessment Lower Extremity Assessment: Generalized weakness;LLE deficits/detail LLE Deficits / Details: 2+/5 knee flexion/extension, hip flexion - pt states due  to DVT in 1980    Cervical / Trunk Assessment Cervical / Trunk Assessment: Normal  Communication   Communication: No difficulties  Cognition Arousal/Alertness: Awake/alert Behavior During Therapy: WFL for tasks assessed/performed Overall Cognitive Status: Within Functional Limits for tasks assessed                                        General Comments      Exercises     Assessment/Plan    PT Assessment Patient needs continued PT services  PT Problem List Decreased strength;Decreased mobility;Decreased safety awareness;Decreased activity tolerance;Decreased balance;Decreased knowledge of use of DME;Pain;Cardiopulmonary status limiting activity       PT Treatment Interventions DME instruction;Therapeutic activities;Gait training;Therapeutic exercise;Patient/family education;Balance training;Stair training;Functional mobility training    PT Goals (Current goals can be found in the Care Plan section)  Acute Rehab PT Goals Patient Stated Goal: get stronger PT Goal Formulation: With patient Time For Goal Achievement: 08/21/19 Potential to Achieve Goals: Good    Frequency Min 3X/week   Barriers to discharge Decreased caregiver support      Co-evaluation               AM-PAC PT "6 Clicks" Mobility  Outcome Measure Help needed turning from your back to your side while in a flat bed without using bedrails?: None Help needed moving from lying on your back to sitting on the side of a flat bed without using bedrails?: None Help needed moving to and from a bed to a chair (including a wheelchair)?: A Little Help needed standing up from a chair using your arms (e.g., wheelchair or bedside chair)?: A Little Help needed to walk in hospital room?: A Little Help needed climbing 3-5 steps with a railing? : A Lot 6 Click Score: 19    End of Session Equipment Utilized During Treatment: Gait belt;Oxygen Activity Tolerance: Patient tolerated treatment  well;Patient limited by fatigue Patient left: in chair;with chair alarm set;with call bell/phone within reach Nurse Communication: Mobility status PT Visit Diagnosis: Muscle weakness (generalized) (M62.81);Difficulty in walking, not elsewhere classified (R26.2)    Time: 1203-1227 PT Time Calculation (min) (ACUTE ONLY): 24 min   Charges:   PT Evaluation $PT Eval Low Complexity: 1 Low PT Treatments $Therapeutic Activity: 8-22 mins        Mathayus Stanbery Conception Chancy, PT Acute Rehabilitation Services Pager [phone]  Office [phone]   Taneya Conkel D Tallon Gertz 08/07/2019, 1:12 PM

## 2019-08-07 NOTE — Progress Notes (Signed)
OT Cancellation Note  Patient Details Name: Tony Zhang MRN: [id number] DOB: 1946-04-17   Cancelled Treatment:    Reason Eval/Treat Not Completed: Other (comment)  Pt sitting EOB eating dinner.  OT will check on pt next day.  Spoke with RN  Kari Baars, [place] [phone] Office- [phone], Edwena Felty D 08/07/2019, 5:30 PM

## 2019-08-07 NOTE — Progress Notes (Signed)
PROGRESS NOTE                                                                                                                                                                                                             Patient Demographics:    Tony Zhang, is a 73 y.o. 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  Patient reports increasing cough with shortness of breath congestion.  Complaint of left-sided chest pain worsened on deep breathing, subjective fevers and chills. In the ED he was in septic shock with hypoxic respiratory failure (O2 sats 77% on room air), fever 102.6 F, tachycardic, hypotensive, lactic acid of 2.8, WC of 15 .9K and AKI with creatinine 1.45.  Also had elevated troponin with no ischemic changes on EKG.  Chest x-ray showed patchy groundglass opacities in the left mid and lower lung lobe concerning for multifocal pneumonia.  Was placed on nonrebreather and started empiric antibiotic.  COVID-19 was tested negative. Admitted to stepdown unit for septic shock.  PCCM involved in care.     Subjective:   Overnight patient was hypotensive requiring pressors (Levophed) briefly.  Off pressors this morning and blood pressure stable.  Complains of cough and left-sided chest pain on inspiration.  Still on 6 L via nasal cannula.   Assessment  & Plan :    Principal  Problem: Severe sepsis with septic shock (HCC) Acute respiratory failure with hypoxemia) Secondary to multifocal pneumonia.  Initially on NRB, transitioned to 6 L via nasal cannula.  Also required pressors briefly during the night for hypotension.  Now stable. Continue IV fluids, empiric vancomycin and cefepime.  Follow blood cultures. Supportive care with antitussives and Vicodin for pain.  Continue as needed nebs Wean oxygen as tolerated, incentive spirometry. Continue stepdown monitoring.  WBC and subsequent lactic acid improving  Left-sided pleuritic chest pain Likely pleurisy from multifocal pneumonia.  Bedside ultrasound was done showing small amount of pleural fluid without effusion or loculation. Pain control with Vicodin.  Empiric antibiotics, DuoNeb and Vicodin as needed for pain.  will obtain CT of the chest if respiratory symptoms unimproved and has persistent pain   Active Problems:   DVT, lower extremity and pulmonary embolism, recurrent INR was subtherapeutic.  Markedly elevated D-dimer.  Given presentation of septic shock with multifocal pneumonia respiratory symptoms are likely due to infection rather than PE. Coumadin continued with IV heparin bridge. Doppler lower extremity negative for DVT.  Elevated troponin/history of CAD No EKG changes.  Troponin trending down.  Cardiology consult appreciated.  Suspect demand ischemia. Recent 2D echo 1 month back showing normal EF and impaired LV relaxation  Hyperlipidemia/PAD Continue statin and cilostazol    COPD (chronic obstructive pulmonary disease) (HCC) Continue scheduled nebs.  Not in acute exacerbation    AKI (acute kidney injury) (HCC) ATN secondary to septic shock.  Improving a.m. labs with fluids and pressors     Code Status : Full code  Family Communication  : None  Disposition Plan  : Continue stepdown monitoring  Barriers For Discharge : Active symptoms  Consults  : PCCM/cardiology  Procedures  :  Doppler lower extremity  DVT Prophylaxis  : IV heparin and Coumadin  Lab Results  Component Value Date   PLT 180 08/07/2019    Antibiotics  :    Anti-infectives (From admission, onward)   Start     Dose/Rate Route Frequency Ordered Stop   08/07/19 1600  vancomycin (VANCOCIN) 1,500 mg in sodium chloride 0.9 % 500 mL IVPB     1,500 mg 250 mL/hr over 120 Minutes Intravenous Every 24 hours 08/06/19 1054     08/06/19 1800  ceFEPIme (MAXIPIME) 2 g in sodium chloride 0.9 % 100 mL IVPB     2 g 200 mL/hr over 30 Minutes Intravenous Every 12 hours 08/06/19 0938     08/06/19 1000  ceFEPIme (MAXIPIME) 2 g in sodium chloride 0.9 % 100 mL IVPB  Status:  Discontinued     2 g 200 mL/hr over 30 Minutes Intravenous Every 12 hours 08/06/19 0837 08/06/19 0938   08/06/19 0900  vancomycin (VANCOCIN) 1,500 mg in sodium chloride 0.9 % 500 mL IVPB     1,500 mg 250 mL/hr over 120 Minutes Intravenous  Once 08/06/19 0832 08/06/19 1137   08/06/19 0800  cefTRIAXone (ROCEPHIN) 2 g in sodium chloride 0.9 % 100 mL IVPB  Status:  Discontinued     2 g 200 mL/hr over 30 Minutes Intravenous Every 24 hours 08/06/19 0623 08/06/19 0816   08/06/19 0630  azithromycin (ZITHROMAX) 500 mg in sodium chloride 0.9 % 250 mL IVPB  Status:  Discontinued     500 mg 250 mL/hr over 60 Minutes Intravenous Daily 08/06/19 0623 08/06/19 0816   08/06/19 0530  vancomycin (VANCOCIN) 2,000 mg in sodium chloride 0.9 % 500 mL IVPB     2,000 mg 250 mL/hr over 120 Minutes Intravenous  Once 08/06/19 0448 08/06/19 0735   08/06/19 0500  ceFEPIme (MAXIPIME) 2 g in sodium chloride 0.9 % 100 mL IVPB     2 g 200 mL/hr over 30 Minutes Intravenous NOW 08/06/19 0447 08/06/19 0536        Objective:   Vitals:   08/07/19 0800 08/07/19 0815 08/07/19 0830 08/07/19 0900  BP: (!) 137/52 (!) 130/100    Pulse: 98 94 (!) 107 (!)  111  Resp: (!) 22 (!) 26 (!) 21 (!) 26  Temp:   98.5 F (36.9 C)   TempSrc:   Oral   SpO2: 97% 98% 99% 98%  Weight:       Height:        Wt Readings from Last 3 Encounters:  08/07/19 94.7 kg  06/26/19 93.6 kg  11/14/18 106.2 kg     Intake/Output Summary (Last 24 hours) at 08/07/2019 1151 Last data filed at 08/07/2019 0821 Gross per 24 hour  Intake 1074.48 ml  Output 1550 ml  Net -475.52 ml     Physical Exam  Gen: not in distress, fatigued HEENT: no pallor, moist mucosa, supple neck Chest: Coarse crackles over left lung base, tender to pressure over the left lower chest CVS: S1-S2 tachycardic, no murmurs rub or gallop GI: Soft, nondistended, nontender Musculoskeletal: warm, no edema     Data Review:    CBC Recent Labs  Lab 08/06/19 0430 08/07/19 0522  WBC 15.9* 13.5*  HGB 16.9 12.1*  HCT 50.6 37.5*  PLT 307 180  MCV 93.2 97.2  MCH 31.1 31.3  MCHC 33.4 32.3  RDW 15.9* 15.9*  LYMPHSABS 0.4*  --   MONOABS 0.9  --   EOSABS 0.0  --   BASOSABS 0.1  --     Chemistries  Recent Labs  Lab 08/06/19 0430 08/07/19 0522  NA 136 136  K 4.0 3.8  CL 103 108  CO2 21* 18*  GLUCOSE 105* 94  BUN 20 26*  CREATININE 1.45* 1.24  CALCIUM 9.4 7.6*  AST 21  --   ALT 11  --   ALKPHOS 71  --   BILITOT 1.1  --    ------------------------------------------------------------------------------------------------------------------ Recent Labs    08/07/19 0522  CHOL 71  HDL 27*  LDLCALC 20  TRIG 045  CHOLHDL 2.6    Lab Results  Component Value Date   HGBA1C 5.0 08/07/2019   ------------------------------------------------------------------------------------------------------------------ No results for input(s): TSH, T4TOTAL, T3FREE, THYROIDAB in the last 72 hours.  Invalid input(s): FREET3 ------------------------------------------------------------------------------------------------------------------ No results for input(s): VITAMINB12, FOLATE, FERRITIN, TIBC, IRON, RETICCTPCT in the last 72 hours.  Coagulation profile Recent Labs  Lab 08/06/19 0430  INR 1.7*    Recent  Labs    08/06/19 0430  DDIMER 11.28*    Cardiac Enzymes No results for input(s): CKMB, TROPONINI, MYOGLOBIN in the last 168 hours.  Invalid input(s): CK ------------------------------------------------------------------------------------------------------------------    Component Value Date/Time   BNP 99.5 08/06/2019 0430    Inpatient Medications  Scheduled Meds:  allopurinol  300 mg Oral Daily   Chlorhexidine Gluconate Cloth  6 each Topical Daily   cilostazol  100 mg Oral BID   diazepam  2 mg Oral QPM   guaiFENesin  600 mg Oral BID   HYDROcodone-acetaminophen  1 tablet Oral Q4H   ipratropium-albuterol  3 mL Nebulization QID   mouth rinse  15 mL Mouth Rinse BID   pantoprazole  40 mg Oral BID   simvastatin  40 mg Oral q1800   Continuous Infusions:  ceFEPime (MAXIPIME) IV Stopped (08/07/19 0609)   heparin 1,900 Units/hr (08/07/19 0821)   vancomycin     PRN Meds:.acetaminophen **OR** acetaminophen, cyclobenzaprine, levalbuterol, ondansetron **OR** ondansetron (ZOFRAN) IV, senna-docusate  Micro Results Recent Results (from the past 240 hour(s))  SARS Coronavirus 2 by RT PCR (hospital order, performed in [facility] hospital lab) Nasopharyngeal Nasopharyngeal Swab     Status: None   Collection Time: 08/06/19  4:09 AM  Specimen: Nasopharyngeal Swab  Result Value Ref Range Status   SARS Coronavirus 2 NEGATIVE NEGATIVE Final    Comment: (NOTE) If result is NEGATIVE SARS-CoV-2 target nucleic acids are NOT DETECTED. The SARS-CoV-2 RNA is generally detectable in upper and lower  respiratory specimens during the acute phase of infection. The lowest  concentration of SARS-CoV-2 viral copies this assay can detect is 250  copies / mL. A negative result does not preclude SARS-CoV-2 infection  and should not be used as the sole basis for treatment or other  patient management decisions.  A negative result may occur with  improper specimen collection / handling,  submission of specimen other  than nasopharyngeal swab, presence of viral mutation(s) within the  areas targeted by this assay, and inadequate number of viral copies  (<250 copies / mL). A negative result must be combined with clinical  observations, patient history, and epidemiological information. If result is POSITIVE SARS-CoV-2 target nucleic acids are DETECTED. The SARS-CoV-2 RNA is generally detectable in upper and lower  respiratory specimens dur ing the acute phase of infection.  Positive  results are indicative of active infection with SARS-CoV-2.  Clinical  correlation with patient history and other diagnostic information is  necessary to determine patient infection status.  Positive results do  not rule out bacterial infection or co-infection with other viruses. If result is PRESUMPTIVE POSTIVE SARS-CoV-2 nucleic acids MAY BE PRESENT.   A presumptive positive result was obtained on the submitted specimen  and confirmed on repeat testing.  While 2019 novel coronavirus  (SARS-CoV-2) nucleic acids may be present in the submitted sample  additional confirmatory testing may be necessary for epidemiological  and / or clinical management purposes  to differentiate between  SARS-CoV-2 and other Sarbecovirus currently known to infect humans.  If clinically indicated additional testing with an alternate test  methodology [phone]) is advised. The SARS-CoV-2 RNA is generally  detectable in upper and lower respiratory sp ecimens during the acute  phase of infection. The expected result is Negative. Fact Sheet for Patients:  BoilerBrush.com.cy Fact Sheet for Healthcare Providers: https://pope.com/ This test is not yet approved or cleared by the [place] FDA and has been authorized for detection and/or diagnosis of SARS-CoV-2 by FDA under an Emergency Use Authorization (EUA).  This EUA will remain in effect (meaning this test can be  used) for the duration of the COVID-19 declaration under Section 564(b)(1) of the Act, 21 U.S.C. section 360bbb-3(b)(1), unless the authorization is terminated or revoked sooner. Performed at [facility], [address], [place], [place] [address]   Blood culture (routine x 2)     Status: None (Preliminary result)   Collection Time: 08/06/19  4:30 AM   Specimen: BLOOD RIGHT HAND  Result Value Ref Range Status   Specimen Description BLOOD RIGHT HAND  Final   Special Requests   Final    BOTTLES DRAWN AEROBIC AND ANAEROBIC Blood Culture adequate volume   Culture   Final    NO GROWTH 1 DAY Performed at [facility], [address] [place], [place] [address]    Report Status PENDING  Incomplete  Blood culture (routine x 2)     Status: None (Preliminary result)   Collection Time: 08/06/19  4:30 AM   Specimen: BLOOD  Result Value Ref Range Status   Specimen Description BLOOD RIGHT ANTECUBITAL  Final   Special Requests   Final    BOTTLES DRAWN AEROBIC AND ANAEROBIC Blood Culture adequate volume  Culture  Setup Time   Final    GRAM POSITIVE COCCI CRITICAL RESULT CALLED TO, READ BACK BY AND VERIFIED WITH: J GRIMSLEY PHARMD 08/07/19 0141 JDW IN BOTH AEROBIC AND ANAEROBIC BOTTLES    Culture   Final    CULTURE REINCUBATED FOR BETTER GROWTH Performed at [facility], [place] [address] [place], [place] [address]    Report Status PENDING  Incomplete  Culture, sputum-assessment     Status: None   Collection Time: 08/06/19  6:23 AM   Specimen: Sputum  Result Value Ref Range Status   Specimen Description SPUTUM  Final   Special Requests NONE  Final   Sputum evaluation   Final    THIS SPECIMEN IS ACCEPTABLE FOR SPUTUM CULTURE Performed at [facility], [place] [address], [place], [place] [address]    Report Status 08/07/2019 FINAL  Final    Radiology Reports Dg Chest Port 1 View  Result Date: 08/06/2019 CLINICAL DATA:  Cough, fever,  shortness of breath. EXAM: PORTABLE CHEST 1 VIEW COMPARISON:  Radiograph 03/05/2018 FINDINGS: Patchy and ground-glass consolidation in the left mid and lower lung zone. Additional patchy opacity in the periphery of the right mid lung. Heart is normal in size with unchanged mediastinal contours. Aortic atherosclerosis. No pulmonary edema. No large pleural effusion. No pneumothorax. No acute osseous abnormalities are seen. IMPRESSION: 1. Patchy and ground-glass opacities in the left mid and lower lung zone and periphery of the right mid lung, suspicious for multifocal pneumonia. Followup PA and lateral chest X-ray is recommended in 3-4 weeks following trial of antibiotic therapy to ensure resolution and exclude underlying malignancy. 2.  Aortic Atherosclerosis (ICD10-I70.0). Electronically Signed   By: Keith Rake M.D.   On: 08/06/2019 04:15   Vas [place] Lower Extremity Venous (dvt)  Result Date: 08/07/2019  Lower Venous Study Indications: Swelling.  Limitations: Posterior acoustic shadowing secondary to arterial calcification. Comparison Study: No prior study. Performing Technologist: Maudry Mayhew MHA, RDMS, RVT, RDCS  Examination Guidelines: A complete evaluation includes B-mode imaging, spectral Doppler, color Doppler, and power Doppler as needed of all accessible portions of each vessel. Bilateral testing is considered an integral part of a complete examination. Limited examinations for reoccurring indications may be performed as noted.  +---------+---------------+---------+-----------+----------+--------------+  RIGHT     Compressibility Phasicity Spontaneity Properties Thrombus Aging  +---------+---------------+---------+-----------+----------+--------------+  CFV       Full            Yes       Yes                                    +---------+---------------+---------+-----------+----------+--------------+  SFJ       Full                                                              +---------+---------------+---------+-----------+----------+--------------+  FV Prox   Full                                                             +---------+---------------+---------+-----------+----------+--------------+  FV Mid    Full                                                             +---------+---------------+---------+-----------+----------+--------------+  FV Distal Full                                                             +---------+---------------+---------+-----------+----------+--------------+  PFV       Full                                                             +---------+---------------+---------+-----------+----------+--------------+  POP       Full            Yes       Yes                                    +---------+---------------+---------+-----------+----------+--------------+  PTV       Full                                                             +---------+---------------+---------+-----------+----------+--------------+  PERO      Full                                                             +---------+---------------+---------+-----------+----------+--------------+   +---------+---------------+---------+-----------+----------+--------------+  LEFT      Compressibility Phasicity Spontaneity Properties Thrombus Aging  +---------+---------------+---------+-----------+----------+--------------+  CFV       Full            Yes       Yes                                    +---------+---------------+---------+-----------+----------+--------------+  SFJ       Full                                                             +---------+---------------+---------+-----------+----------+--------------+  FV Prox   Full                                                             +---------+---------------+---------+-----------+----------+--------------+  FV Mid    Full                                                              +---------+---------------+---------+-----------+----------+--------------+  FV Distal Full                                                             +---------+---------------+---------+-----------+----------+--------------+  PFV       Full                                                             +---------+---------------+---------+-----------+----------+--------------+  POP       Full            Yes       Yes                                    +---------+---------------+---------+-----------+----------+--------------+  PTV       Full                                                             +---------+---------------+---------+-----------+----------+--------------+  PERO      Full                                                             +---------+---------------+---------+-----------+----------+--------------+  Summary: Right: There is no evidence of deep vein thrombosis in the lower extremity. No cystic structure found in the popliteal fossa. Left: There is no evidence of deep vein thrombosis in the lower extremity. No cystic structure found in the popliteal fossa.  *See table(s) above for measurements and observations. Electronically signed by Fabienne Bruns MD on 08/07/2019 at 11:22:06 AM.    Final     Time Spent in minutes 35   Pierre Dellarocco M.D on 08/07/2019 at 11:51 AM  Between 7am to 7pm - Pager - [phone]  After 7pm go to [email or web address] - password [facility]  [facility] -  Office  [phone]

## 2019-08-08 DIAGNOSIS — D72829 Elevated white blood cell count, unspecified: Secondary | ICD-10-CM

## 2019-08-08 DIAGNOSIS — D649 Anemia, unspecified: Secondary | ICD-10-CM

## 2019-08-08 DIAGNOSIS — D696 Thrombocytopenia, unspecified: Secondary | ICD-10-CM

## 2019-08-08 LAB — COMPREHENSIVE METABOLIC PANEL
ALT: 11 U/L (ref 0–44)
AST: 13 U/L — ABNORMAL LOW (ref 15–41)
Albumin: 2.6 g/dL — ABNORMAL LOW (ref 3.5–5.0)
Alkaline Phosphatase: 57 U/L (ref 38–126)
Anion gap: 11 (ref 5–15)
BUN: 25 mg/dL — ABNORMAL HIGH (ref 8–23)
CO2: 17 mmol/L — ABNORMAL LOW (ref 22–32)
Calcium: 7.7 mg/dL — ABNORMAL LOW (ref 8.9–10.3)
Chloride: 106 mmol/L (ref 98–111)
Creatinine, Ser: 1 mg/dL (ref 0.61–1.24)
GFR calc Af Amer: >60 mL/min (ref >60)
GFR calc non Af Amer: >60 mL/min (ref >60)
Glucose, Bld: 112 mg/dL — ABNORMAL HIGH (ref 70–99)
Potassium: 3.5 mmol/L (ref 3.5–5.1)
Sodium: 134 mmol/L — ABNORMAL LOW (ref 135–145)
Total Bilirubin: 1 mg/dL (ref 0.3–1.2)
Total Protein: 5.4 g/dL — ABNORMAL LOW (ref 6.5–8.1)

## 2019-08-08 LAB — CBC
HCT: 31.5 % — ABNORMAL LOW (ref 39.0–52.0)
Hemoglobin: 10.4 g/dL — ABNORMAL LOW (ref 13.0–17.0)
MCH: 31.1 pg (ref 26.0–34.0)
MCHC: 33 g/dL (ref 30.0–36.0)
MCV: 94.3 fL (ref 80.0–100.0)
Platelets: 149 10*3/uL — ABNORMAL LOW (ref 150–400)
RBC: 3.34 MIL/uL — ABNORMAL LOW (ref 4.22–5.81)
RDW: 15.7 % — ABNORMAL HIGH (ref 11.5–15.5)
WBC: 9.3 10*3/uL (ref 4.0–10.5)
nRBC: 0 % (ref 0.0–0.2)

## 2019-08-08 LAB — HEPARIN LEVEL (UNFRACTIONATED)
Heparin Unfractionated: 0.2 IU/mL — ABNORMAL LOW (ref 0.30–0.70)
Heparin Unfractionated: 0.21 IU/mL — ABNORMAL LOW (ref 0.30–0.70)
Heparin Unfractionated: 0.21 IU/mL — ABNORMAL LOW (ref 0.30–0.70)

## 2019-08-08 LAB — PHOSPHORUS: Phosphorus: 2.7 mg/dL (ref 2.5–4.6)

## 2019-08-08 LAB — PROTIME-INR
INR: 1.9 — ABNORMAL HIGH (ref 0.8–1.2)
Prothrombin Time: 21.4 seconds — ABNORMAL HIGH (ref 11.4–15.2)

## 2019-08-08 LAB — LEGIONELLA PNEUMOPHILA SEROGP 1 UR AG: L. pneumophila Serogp 1 Ur Ag: NEGATIVE

## 2019-08-08 LAB — TROPONIN I (HIGH SENSITIVITY)
Troponin I (High Sensitivity): 59 ng/L — ABNORMAL HIGH (ref <18)
Troponin I (High Sensitivity): 69 ng/L — ABNORMAL HIGH (ref <18)

## 2019-08-08 LAB — PROCALCITONIN: Procalcitonin: 5.2 ng/mL

## 2019-08-08 LAB — MAGNESIUM: Magnesium: 1.2 mg/dL — ABNORMAL LOW (ref 1.7–2.4)

## 2019-08-08 MED ORDER — SODIUM CHLORIDE 0.9 % IV SOLN
2.0000 g | Freq: Three times a day (TID) | INTRAVENOUS | Status: DC
  Administered 2019-08-08 – 2019-08-11 (×10): 2 g via INTRAVENOUS
  Filled 2019-08-08 (×13): qty 2

## 2019-08-08 MED ORDER — CALCIUM GLUCONATE-NACL 1-0.675 GM/50ML-% IV SOLN
1.0000 g | Freq: Once | INTRAVENOUS | Status: AC
  Administered 2019-08-08: 09:00:00 1000 mg via INTRAVENOUS
  Filled 2019-08-08: qty 50

## 2019-08-08 MED ORDER — MORPHINE SULFATE (PF) 2 MG/ML IV SOLN
2.0000 mg | Freq: Once | INTRAVENOUS | Status: AC
  Administered 2019-08-08: 2 mg via INTRAVENOUS
  Filled 2019-08-08: qty 1

## 2019-08-08 MED ORDER — NEBIVOLOL HCL 10 MG PO TABS
10.0000 mg | ORAL_TABLET | Freq: Every day | ORAL | Status: DC
  Administered 2019-08-08 – 2019-08-14 (×7): 10 mg via ORAL
  Filled 2019-08-08 (×7): qty 1

## 2019-08-08 MED ORDER — VANCOMYCIN HCL IN DEXTROSE 1-5 GM/200ML-% IV SOLN
1000.0000 mg | Freq: Two times a day (BID) | INTRAVENOUS | Status: DC
  Administered 2019-08-08 – 2019-08-09 (×2): 1000 mg via INTRAVENOUS
  Filled 2019-08-08 (×2): qty 200

## 2019-08-08 MED ORDER — MAGNESIUM SULFATE 4 GM/100ML IV SOLN
4.0000 g | Freq: Once | INTRAVENOUS | Status: AC
  Administered 2019-08-08: 4 g via INTRAVENOUS
  Filled 2019-08-08: qty 100

## 2019-08-08 MED ORDER — DIPHENHYDRAMINE HCL 25 MG PO CAPS: 25.0000 mg | ORAL_CAPSULE | Freq: Four times a day (QID) | ORAL | Status: DC | PRN

## 2019-08-08 MED ORDER — WARFARIN SODIUM 3 MG PO TABS
3.0000 mg | ORAL_TABLET | Freq: Once | ORAL | Status: AC
  Administered 2019-08-08: 18:00:00 3 mg via ORAL
  Filled 2019-08-08: qty 1

## 2019-08-08 NOTE — Progress Notes (Signed)
Pt. declined CPT via bed this round.

## 2019-08-08 NOTE — Progress Notes (Signed)
Pharmacy Antibiotic Note  Tony Zhang is a 73 y.o. male admitted on 08/06/2019 with sepsis/pna  Pharmacy has been consulted for vancomycin and cefepime dosing.  08/08/2019 Scr 1.0, CrCl ~ 79.20mls/min WBC 9.3 afebrile  Plan: For improved renal function: Increase cefepime to 2gm IV q8h Increase vancomycin to 1gm IV q12h (AUC 439.3, Cmin 12.9, Scr 1.0) Follow renal function, cultures and clinical course vanc levels as indicated  Height: 6\' 1"  (185.4 cm) Weight: 213 lb 13.5 oz (97 kg) IBW/kg (Calculated) : 79.9  Temp (24hrs), Avg:98.4 F (36.9 C), Min:97.9 F (36.6 C), Max:98.6 F (37 C)  Recent Labs  Lab 08/06/19 0430 08/06/19 0839 08/06/19 1106 08/07/19 0522 08/08/19 0156  WBC 15.9*  --   --  13.5* 9.3  CREATININE 1.45*  --   --  1.24 1.00  LATICACIDVEN 2.8* 1.8 2.1*  --   --     Estimated Creatinine Clearance: 79.5 mL/min (by C-G formula based on SCr of 1 mg/dL).    Allergies  Allergen Reactions  . Codeine Hives and Other (See Comments)    Takes benadryl to stop reaction    Antimicrobials this admission: 10/19 azith x 1 10/19 vanc >> 10/19 cefepime >>   Dose adjustments this admission:   Microbiology results: 10/19 BCx: GPC 10/20 MRSA PCR: neg  Thank you for allowing pharmacy to be a part of this patient's care.  Dolly Rias RPh 08/08/2019, 10:53 AM Pager [phone]

## 2019-08-08 NOTE — Progress Notes (Signed)
ANTICOAGULATION CONSULT NOTE - Follow Up Consult  Pharmacy Consult for Heparin Indication: VTE Treatment  Allergies  Allergen Reactions  . Codeine Hives and Other (See Comments)    Takes benadryl to stop reaction    Patient Measurements: Height: 6\' 1"  (185.4 cm) Weight: 208 lb 12.4 oz (94.7 kg) IBW/kg (Calculated) : 79.9 Heparin Dosing Weight:   Vital Signs: Temp: 97.9 F (36.6 C) (10/20 2345) Temp Source: Oral (10/20 2345) BP: 126/34 (10/21 0000) Pulse Rate: 103 (10/21 0000)  Labs: Recent Labs    08/06/19 0430 08/06/19 0839 08/06/19 1106  08/07/19 0522 08/07/19 1627 08/08/19 0156  HGB 16.9  --   --   --  12.1*  --  10.4*  HCT 50.6  --   --   --  37.5*  --  31.5*  PLT 307  --   --   --  180  --  149*  LABPROT 19.3*  --   --   --   --   --  21.4*  INR 1.7*  --   --   --   --   --  1.9*  HEPARINUNFRC  --   --   --    < > 0.21* 0.17* 0.20*  CREATININE 1.45*  --   --   --  1.24  --   --   TROPONINIHS 148* 269* 254*  --   --   --   --    < > = values in this interval not displayed.    Estimated Creatinine Clearance: 59.1 mL/min (by C-G formula based on SCr of 1.24 mg/dL).   Medications:  Infusions:  . ceFEPime (MAXIPIME) IV Stopped (08/07/19 1921)  . heparin 2,150 Units/hr (08/07/19 2031)  . vancomycin Stopped (08/07/19 1830)    Assessment: Patient with low heparin level again.  No heparin issues per RN.  Goal of Therapy:  Heparin level 0.3-0.7 units/ml Monitor platelets by anticoagulation protocol: Yes   Plan:  Increase heparin to 2300  Units/hr Recheck level at 1200  Tyler Deis, Shea Stakes Crowford 08/08/2019,2:51 AM

## 2019-08-08 NOTE — Progress Notes (Signed)
CPT via bed held at this time, pt. has been awoken X times this shift, has strong productive cough.

## 2019-08-08 NOTE — Progress Notes (Signed)
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  Patient reports increasing cough with shortness of breath congestion.  Complaint of left-sided chest pain worsened on deep breathing, subjective fevers and chills. In the ED he was in septic shock with hypoxic respiratory failure (O2 sats 77% on room air), fever 102.6 F, tachycardic, hypotensive, lactic acid of 2.8, WC of 15 .9K and AKI with creatinine 1.45.  Also had elevated troponin with no ischemic changes on EKG.  Chest x-ray showed patchy groundglass opacities in the left mid and lower lung lobe concerning for multifocal pneumonia.  Was placed on nonrebreather and started empiric antibiotic.  COVID-19 was tested negative. Admitted to stepdown unit for septic shock.  PCCM involved in care.    **Interim History The night before last the patient was hypotensive briefly requiring Levophed and was weaned off of it yesterday morning.  Blood pressures remained stable but he still complains of cough and left-sided chest pain on inspiration and felt it is a deep stabbing pain.  Still on supplemental oxygen via nasal cannula and is improving slightly.   Assessment & Plan:   Principal Problem:   Sepsis due to pneumonia [facility]) Active Problems:   DVT, lower extremity and pulmonary embolism, recurrent   Hyperlipidemia   Gout   COPD (chronic obstructive pulmonary disease) (HCC)   Multifocal pneumonia   AKI (acute kidney injury) (HCC)   Pressure injury of skin   Acute respiratory failure with hypoxia (HCC)   Demand ischemia (HCC)  Severe sepsis with septic shock  (HCC) Acute respiratory failure with hypoxemia) -Secondary to multifocal pneumonia.  Initially on NRB, transitioned to 6 L via nasal cannula.  Also required pressors briefly during the night for hypotension.  Now stable. -Continued IV fluids and now off,  -C/w empiric vancomycin and cefepime.  Follow blood cultures. Supportive care with antitussives and Vicodin for pain.  Continue as needed nebs -Wean oxygen as tolerated, incentive spirometry. -Continue stepdown monitoring.  WBC and subsequent lactic acid improving -Continue to Monitor for Infection   Left-sided pleuritic chest pain -Likely pleurisy from multifocal pneumonia.   -Bedside ultrasound was done showing small amount of pleural fluid without effusion or loculation. -Pain control with Vicodin and given a 1x dose of Morphine.  Empiric antibiotics, DuoNeb and Vicodin as needed for pain.  will obtain CT of the chest if respiratory symptoms unimproved and has persistent pain  DVT, lower extremity and pulmonary embolism, recurrent -INR was subtherapeutic.  Markedly elevated D-dimer.  Given presentation of septic shock with multifocal pneumonia respiratory symptoms are likely due to infection rather than PE. -Coumadin continued with IV heparin bridge. -Doppler lower extremity negative for DVT.  Elevated troponin/history of CAD -No EKG changes.  Troponin trending down and last Two Troponin was 59 and 69.  Cardiology consult appreciated and feel it is pleuritic.  Suspect demand ischemia. -Recent 2D echo 1 month back showing normal EF and impaired LV relaxation -Cardiology did not feel there is any acute cardiac concerns at this time and have signed off the case  Hyperlipidemia/PAD -Lipid panel was checked and showed a total cholesterol/HDL ratio of 2.6, cholesterol level of 71, HDL 27,  LDL 20, triglycerides 119, VLDL 24 -Continue statin and cilostazol    COPD (chronic obstructive pulmonary disease) (HCC) -Continue scheduled nebs.   Not in acute exacerbation    AKI (acute kidney injury) (HCC) -ATN secondary to septic shock.  Improving a.m. labs with fluids and pressors  Leukocytosis -Improving. WBC went from 13.5 -> 9.3 -C/w Abx as delineated as above -Continue to Monitor and trend -Procalcitonin is trending down  Normocytic Anemia -Patient's hemoglobin/hematocrit went from 12.1/37.5 is now 10.4/31.5 -Check anemia panel in the a.m. -Continue monitor for signs and symptoms of bleeding; currently no overt bleeding noted-repeat CBC in a.m.  Thrombocytopenia The patient's platelet count went from 180,000 and is now 149,000 -Continue monitor as patient is on a heparin drip and anticoagulated warfarin Continue monitor for signs and symptoms of bleeding -If continues to drop further will need to consider HIT panel if clinically do not feel he has HIT -To monitor and trend and repeat CBC  Hypomagnesemia -Patient magnesium level was extremely low at 1.2 -Continue monitor and replete with with IV mag sulfate 4 g -Repeat magnesium level in the a.m.  Hypocalcemia -Patient's calcium level 7.7 and adjusted for albumin is a little bit higher -Given a dose of IV calcium gluconate -Continue to monitor and trend and repeat CMP in the a.m.  DVT prophylaxis: Heparin gtt bridge with Coumadin  Code Status: FULL CODE Family Communication: No family present at bedside  Disposition Plan: Remain in SDU for now and Transfer to Medical floor if stable   Consultants:   Cardiology  PCCM/Pulmonary    Procedures:    Antimicrobials:  Anti-infectives (From admission, onward)   Start     Dose/Rate Route Frequency Ordered Stop   08/07/19 1600  vancomycin (VANCOCIN) 1,500 mg in sodium chloride 0.9 % 500 mL IVPB     1,500 mg 250 mL/hr over 120 Minutes Intravenous Every 24 hours 08/06/19 1054     08/06/19 1800  ceFEPIme (MAXIPIME) 2 g in sodium chloride 0.9 % 100 mL IVPB     2 g 200 mL/hr over 30 Minutes Intravenous Every  12 hours 08/06/19 0938     08/06/19 1000  ceFEPIme (MAXIPIME) 2 g in sodium chloride 0.9 % 100 mL IVPB  Status:  Discontinued     2 g 200 mL/hr over 30 Minutes Intravenous Every 12 hours 08/06/19 0837 08/06/19 0938   08/06/19 0900  vancomycin (VANCOCIN) 1,500 mg in sodium chloride 0.9 % 500 mL IVPB     1,500 mg 250 mL/hr over 120 Minutes Intravenous  Once 08/06/19 0832 08/06/19 1137   08/06/19 0800  cefTRIAXone (ROCEPHIN) 2 g in sodium chloride 0.9 % 100 mL IVPB  Status:  Discontinued     2 g 200 mL/hr over 30 Minutes Intravenous Every 24 hours 08/06/19 0623 08/06/19 0816   08/06/19 0630  azithromycin (ZITHROMAX) 500 mg in sodium chloride 0.9 % 250 mL IVPB  Status:  Discontinued     500 mg 250 mL/hr over 60 Minutes Intravenous Daily 08/06/19 0623 08/06/19 0816   08/06/19 0530  vancomycin (VANCOCIN) 2,000 mg in sodium chloride 0.9 % 500 mL IVPB     2,000 mg 250 mL/hr over 120 Minutes Intravenous  Once 08/06/19 0448 08/06/19 0735   08/06/19 0500  ceFEPIme (MAXIPIME) 2 g in sodium chloride 0.9 % 100 mL IVPB     2 g 200 mL/hr over 30 Minutes Intravenous NOW 08/06/19 0447 08/06/19 0536     Subjective: Seen and examined at bedside and was  complaining of significant chest pain.  Did not feel warm to follow short of breath.  No nausea or vomiting.  No other concerns reported at this time except his chest pain was quite severe but reproducible on palpation and worse with inspiration.  Objective: Vitals:   08/08/19 0338 08/08/19 0400 08/08/19 0600 08/08/19 0700  BP:  (!) 102/40 (!) 107/45 (!) 128/49  Pulse:  100 (!) 103 (!) 101  Resp:  Temp:      TempSrc:      SpO2:  92% 95% 95%  Weight: 97 kg     Height:        Intake/Output Summary (Last 24 hours) at 08/08/2019 0732 Last data filed at 08/08/2019 1610 Gross per 24 hour  Intake 1970.51 ml  Output 1425 ml  Net 545.51 ml   Filed Weights   08/06/19 0352 08/07/19 0500 08/08/19 0338  Weight: 93.4 kg 94.7 kg 97 kg    Examination: Physical Exam:  Constitutional: WN/WD overweight Caucasian male currently in some distress appears frustrated and uncomfortable Eyes: Lids and conjunctivae normal, sclerae anicteric  ENMT: External Ears, Nose appear normal. Grossly normal hearing.  Neck: Appears normal, supple, no cervical masses, normal ROM, no appreciable thyromegaly; no JVD Respiratory: Diminished to auscultation bilaterally, no wheezing, rales, rhonchi or crackles. Normal respiratory effort and patient is not tachypenic. No accessory muscle use. Wearing Supplemental O2 via [place] Cardiovascular: Tachycardic rate but regular rhythm, no murmurs / rubs / gallops. S1 and S2 auscultated. Trace extremity edema.  Abdomen: Soft, non-tender, Distended. Bowel sounds positive x4.  GU: Deferred. Musculoskeletal: No clubbing / cyanosis of digits/nails. No joint deformity upper and lower extremities.  Skin: No rashes, lesions, ulcers on a limited skin evaluation. No induration; Warm and dry.  Neurologic: CN 2-12 grossly intact with no focal deficits. Romberg sign and cerebellar reflexes not assessed.  Psychiatric: Normal judgment and insight. Alert and oriented x 3. Extremely anxious mood and appropriate affect.   Data Reviewed: I have personally reviewed following labs and imaging studies  CBC: Recent Labs  Lab 08/06/19 0430 08/07/19 0522 08/08/19 0156  WBC 15.9* 13.5* 9.3  NEUTROABS 14.3*  --   --   HGB 16.9 12.1* 10.4*  HCT 50.6 37.5* 31.5*  MCV 93.2 97.2 94.3  PLT 307 180 149*   Basic Metabolic Panel: Recent Labs  Lab 08/06/19 0430 08/07/19 0522  NA 136 136  K 4.0 3.8  CL 103 108  CO2 21* 18*  GLUCOSE 105* 94  BUN 20 26*  CREATININE 1.45* 1.24  CALCIUM 9.4 7.6*   GFR: Estimated Creatinine Clearance: 64.1 mL/min (by C-G formula based on SCr of 1.24 mg/dL). Liver Function Tests: Recent Labs  Lab 08/06/19 0430  AST 21  ALT 11  ALKPHOS 71  BILITOT 1.1  PROT 7.4  ALBUMIN 4.0   No results  for input(s): LIPASE, AMYLASE in the last 168 hours. No results for input(s): AMMONIA in the last 168 hours. Coagulation Profile: Recent Labs  Lab 08/06/19 0430 08/08/19 0156  INR 1.7* 1.9*   Cardiac Enzymes: No results for input(s): CKTOTAL, CKMB, CKMBINDEX, TROPONINI in the last 168 hours. BNP (last 3 results) No results for input(s): PROBNP in the last 8760 hours. HbA1C: Recent Labs    08/07/19 0522  HGBA1C 5.0   CBG: No results for input(s): GLUCAP in the last 168 hours. Lipid Profile: Recent Labs    08/07/19 0522  CHOL 71  HDL 27*  LDLCALC 20  TRIG 960  CHOLHDL 2.6   Thyroid Function Tests: No results for input(s): TSH, T4TOTAL, FREET4, T3FREE, THYROIDAB in the last 72 hours. Anemia Panel: No results for input(s): VITAMINB12, FOLATE, FERRITIN, TIBC, IRON, RETICCTPCT in the last 72 hours. Sepsis Labs: Recent Labs  Lab 08/06/19 0430 08/06/19 0839 08/06/19 1106 08/07/19 0522 08/08/19 0156  PROCALCITON  --  15.48  --  9.91 5.20  LATICACIDVEN 2.8* 1.8 2.1*  --   --     Recent Results (from the past 240 hour(s))  SARS Coronavirus 2 by RT PCR (hospital order, performed in [facility] hospital lab) Nasopharyngeal Nasopharyngeal Swab     Status: None   Collection Time: 08/06/19  4:09 AM   Specimen: Nasopharyngeal Swab  Result Value Ref Range Status   SARS Coronavirus 2 NEGATIVE NEGATIVE Final    Comment: (NOTE) If result is NEGATIVE SARS-CoV-2 target nucleic acids are NOT DETECTED. The SARS-CoV-2 RNA is generally detectable in upper and lower  respiratory specimens during the acute phase of infection. The lowest  concentration of SARS-CoV-2 viral copies this assay can detect is 250  copies / mL. A negative result does not preclude SARS-CoV-2 infection  and should not be used as the sole basis for treatment or other  patient management decisions.  A negative result may occur with  improper specimen collection / handling, submission of specimen other  than  nasopharyngeal swab, presence of viral mutation(s) within the  areas targeted by this assay, and inadequate number of viral copies  (<250 copies / mL). A negative result must be combined with clinical  observations, patient history, and epidemiological information. If result is POSITIVE SARS-CoV-2 target nucleic acids are DETECTED. The SARS-CoV-2 RNA is generally detectable in upper and lower  respiratory specimens dur ing the acute phase of infection.  Positive  results are indicative of active infection with SARS-CoV-2.  Clinical  correlation with patient history and other diagnostic information is  necessary to determine patient infection status.  Positive results do  not rule out bacterial infection or co-infection with other viruses. If result is PRESUMPTIVE POSTIVE SARS-CoV-2 nucleic acids MAY BE PRESENT.   A presumptive positive result was obtained on the submitted specimen  and confirmed on repeat testing.  While 2019 novel coronavirus  (SARS-CoV-2) nucleic acids may be present in the submitted sample  additional confirmatory testing may be necessary for epidemiological  and / or clinical management purposes  to differentiate between  SARS-CoV-2 and other Sarbecovirus currently known to infect humans.  If clinically indicated additional testing with an alternate test  methodology [id number]) is advised. The SARS-CoV-2 RNA is generally  detectable in upper and lower respiratory sp ecimens during the acute  phase of infection. The expected result is Negative. Fact Sheet for Patients:  StrictlyIdeas.no Fact Sheet for Healthcare Providers: BankingDealers.co.za This test is not yet approved or cleared by the [place] FDA and has been authorized for detection and/or diagnosis of SARS-CoV-2 by FDA under an Emergency Use Authorization (EUA).  This EUA will remain in effect (meaning this test can be used) for the duration of  the COVID-19 declaration under Section 564(b)(1) of the Act, 21 U.S.C. section 360bbb-3(b)(1), unless the authorization is terminated or revoked sooner. Performed at [facility], [place] [address], [place], [place] [address]   Blood culture (routine x 2)     Status: None (Preliminary result)   Collection Time: 08/06/19  4:30 AM   Specimen: BLOOD RIGHT HAND  Result Value Ref Range Status   Specimen  Description BLOOD RIGHT HAND  Final   Special Requests   Final    BOTTLES DRAWN AEROBIC AND ANAEROBIC Blood Culture adequate volume   Culture   Final    NO GROWTH 2 DAYS Performed at [facility], [address], [place], [place] [address]    Report Status PENDING  Incomplete  Blood culture (routine x 2)     Status: None (Preliminary result)   Collection Time: 08/06/19  4:30 AM   Specimen: BLOOD  Result Value Ref Range Status   Specimen Description BLOOD RIGHT ANTECUBITAL  Final   Special Requests   Final    BOTTLES DRAWN AEROBIC AND ANAEROBIC Blood Culture adequate volume   Culture  Setup Time   Final    GRAM POSITIVE COCCI CRITICAL RESULT CALLED TO, READ BACK BY AND VERIFIED WITH: J GRIMSLEY PHARMD 08/07/19 0141 JDW IN BOTH AEROBIC AND ANAEROBIC BOTTLES    Culture   Final    CULTURE REINCUBATED FOR BETTER GROWTH Performed at [facility], [address], [place], [place] [address]    Report Status PENDING  Incomplete  Culture, sputum-assessment     Status: None   Collection Time: 08/06/19  6:23 AM   Specimen: Sputum  Result Value Ref Range Status   Specimen Description SPUTUM  Final   Special Requests NONE  Final   Sputum evaluation   Final    THIS SPECIMEN IS ACCEPTABLE FOR SPUTUM CULTURE Performed at [facility], [address], [place], [place] [address]    Report Status 08/07/2019 FINAL  Final  Culture, respiratory     Status: None (Preliminary result)   Collection Time: 08/06/19  6:23 AM   Specimen: SPU  Result Value  Ref Range Status   Specimen Description   Final    SPUTUM Performed at [facility], [address] [place], [place] [address]    Special Requests   Final    NONE Reflexed from [id number] Performed at [facility], [address] [place], [place] [address]    Gram Stain   Final    ABUNDANT WBC PRESENT,BOTH PMN AND MONONUCLEAR FEW GRAM POSITIVE COCCI FEW GRAM POSITIVE RODS Performed at [facility], [address] [place], [place] [address]    Culture PENDING  Incomplete   Report Status PENDING  Incomplete  Culture, Urine     Status: Abnormal   Collection Time: 08/06/19  8:36 AM   Specimen: Urine, Clean Catch  Result Value Ref Range Status   Specimen Description   Final    URINE, CLEAN CATCH Performed at [facility], [address] [place], [place] [address]    Special Requests   Final    NONE Performed at [facility], [address] [place], [place] [address]    Culture (A)  Final    <10,000 COLONIES/mL INSIGNIFICANT GROWTH Performed at [facility], [address] [place], [place] [address]    Report Status 08/07/2019 FINAL  Final  MRSA PCR Screening     Status: None   Collection Time: 08/07/19  9:30 PM   Specimen: Nasal Mucosa; Nasopharyngeal  Result Value Ref Range Status   MRSA by PCR NEGATIVE NEGATIVE Final    Comment:        The GeneXpert MRSA Assay (FDA approved for NASAL specimens only), is one component of a comprehensive MRSA colonization surveillance program. It is not intended to diagnose MRSA infection nor  to guide or monitor treatment for MRSA infections. Performed at [facility], [address], [place], [place] [address]      RN Pressure Injury Documentation: Pressure Injury 08/06/19 Sacrum Mid Stage I -  Intact skin with non-blanchable redness of a localized area usually over a bony prominence. redness on sacrum (Active)  08/06/19 0844   Location: Sacrum  Location Orientation: Mid  Staging: Stage I -  Intact skin with non-blanchable redness of a localized area usually over a bony prominence.  Wound Description (Comments): redness on sacrum  Present on Admission: Yes    Radiology Studies: Vas [place] Lower Extremity Venous (dvt)  Result Date: 08/07/2019  Lower Venous Study Indications: Swelling.  Limitations: Posterior acoustic shadowing secondary to arterial calcification. Comparison Study: No prior study. Performing Technologist: Gertie Fey MHA, RDMS, RVT, RDCS  Examination Guidelines: A complete evaluation includes B-mode imaging, spectral Doppler, color Doppler, and power Doppler as needed of all accessible portions of each vessel. Bilateral testing is considered an integral part of a complete examination. Limited examinations for reoccurring indications may be performed as noted.  +---------+---------------+---------+-----------+----------+--------------+  RIGHT     Compressibility Phasicity Spontaneity Properties Thrombus Aging  +---------+---------------+---------+-----------+----------+--------------+  CFV       Full            Yes       Yes                                    +---------+---------------+---------+-----------+----------+--------------+  SFJ       Full                                                             +---------+---------------+---------+-----------+----------+--------------+  FV Prox   Full                                                             +---------+---------------+---------+-----------+----------+--------------+  FV Mid    Full                                                             +---------+---------------+---------+-----------+----------+--------------+  FV Distal Full                                                             +---------+---------------+---------+-----------+----------+--------------+  PFV       Full                                                              +---------+---------------+---------+-----------+----------+--------------+  POP       Full            Yes       Yes                                    +---------+---------------+---------+-----------+----------+--------------+  PTV       Full                                                             +---------+---------------+---------+-----------+----------+--------------+  PERO      Full                                                             +---------+---------------+---------+-----------+----------+--------------+   +---------+---------------+---------+-----------+----------+--------------+  LEFT      Compressibility Phasicity Spontaneity Properties Thrombus Aging  +---------+---------------+---------+-----------+----------+--------------+  CFV       Full            Yes       Yes                                    +---------+---------------+---------+-----------+----------+--------------+  SFJ       Full                                                             +---------+---------------+---------+-----------+----------+--------------+  FV Prox   Full                                                             +---------+---------------+---------+-----------+----------+--------------+  FV Mid    Full                                                             +---------+---------------+---------+-----------+----------+--------------+  FV Distal Full                                                             +---------+---------------+---------+-----------+----------+--------------+  PFV       Full                                                             +---------+---------------+---------+-----------+----------+--------------+  POP       Full            Yes       Yes                                    +---------+---------------+---------+-----------+----------+--------------+  PTV       Full                                                              +---------+---------------+---------+-----------+----------+--------------+  PERO      Full                                                             +---------+---------------+---------+-----------+----------+--------------+  Summary: Right: There is no evidence of deep vein thrombosis in the lower extremity. No cystic structure found in the popliteal fossa. Left: There is no evidence of deep vein thrombosis in the lower extremity. No cystic structure found in the popliteal fossa.  *See table(s) above for measurements and observations. Electronically signed by Fabienne Bruns MD on 08/07/2019 at 11:22:06 AM.    Final     Scheduled Meds:  allopurinol  300 mg Oral Daily   Chlorhexidine Gluconate Cloth  6 each Topical Daily   cilostazol  100 mg Oral BID   diazepam  2 mg Oral QPM   guaiFENesin  600 mg Oral BID   HYDROcodone-acetaminophen  1 tablet Oral Q4H   ipratropium-albuterol  3 mL Nebulization QID   mouth rinse  15 mL Mouth Rinse BID   pantoprazole  40 mg Oral BID   simvastatin  40 mg Oral q1800   Warfarin - Pharmacist Dosing Inpatient   Does not apply q1800   Continuous Infusions:  ceFEPime (MAXIPIME) IV Stopped (08/08/19 0604)   heparin 2,300 Units/hr (08/08/19 0336)   vancomycin Stopped (08/07/19 1830)    LOS: 2 days   Merlene Laughter, DO [facility] PAGER is on AMION  If 7PM-7AM, please contact night-coverage [email or web address] Password [facility] 08/08/2019, 7:32 AM

## 2019-08-08 NOTE — Progress Notes (Signed)
OT Cancellation Note  Patient Details Name: Tony Zhang MRN: [id number] DOB: 1946-07-03   Cancelled Treatment:    Reason Eval/Treat Not Completed: Other (comment)  Pt reports he is having chest pain- RN aware.  Pt declined OOB or working with OT at this time due to not feeling well  Kari Baars, [place] [phone] Office- [phone], Thereasa Parkin 08/08/2019, 3:22 PM

## 2019-08-08 NOTE — Progress Notes (Signed)
ANTICOAGULATION CONSULT NOTE -   Pharmacy Consult for heparin/coumadin Indication: VTE treatment  Allergies  Allergen Reactions  . Codeine Hives and Other (See Comments)    Takes benadryl to stop reaction    Patient Measurements: Height: 6\' 1"  (185.4 cm) Weight: 213 lb 13.5 oz (97 kg) IBW/kg (Calculated) : 79.9 Heparin Dosing Weight:   Vital Signs: Temp: 98.7 F (37.1 C) (10/21 1200) Temp Source: Oral (10/21 1200) BP: 134/65 (10/21 1000) Pulse Rate: 108 (10/21 1000)  Labs: Recent Labs    08/06/19 0430 08/06/19 0839 08/06/19 1106  08/07/19 0522 08/07/19 1627 08/08/19 0156 08/08/19 0858 08/08/19 1206  HGB 16.9  --   --   --  12.1*  --  10.4*  --   --   HCT 50.6  --   --   --  37.5*  --  31.5*  --   --   PLT 307  --   --   --  180  --  149*  --   --   LABPROT 19.3*  --   --   --   --   --  21.4*  --   --   INR 1.7*  --   --   --   --   --  1.9*  --   --   HEPARINUNFRC  --   --   --    < > 0.21* 0.17* 0.20*  --  0.21*  CREATININE 1.45*  --   --   --  1.24  --  1.00  --   --   TROPONINIHS 148* 269* 254*  --   --   --   --  59*  --    < > = values in this interval not displayed.    Estimated Creatinine Clearance: 79.5 mL/min (by C-G formula based on SCr of 1 mg/dL).   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  Pharmacy consulted to dose heparin, coumadin resumed 10/20.   PTA coumadin 4mg  po daily, last dose 10/18 at 1600.  08/08/2019 -Heparin level remains low at 0.21 on heparin 2300 units/hr -Per discussion with RN no bleeding and no interruptions with infusion -Hgb 10.4, plts down 149 - INR 1.9   Goal of Therapy:  Heparin level 0.3-0.7 units/ml Monitor platelets by anticoagulation protocol   Plan:  Increase heparin to 2500 units/hr Heparin level in 8 hours Coumadin  3 mg po  x 1 dose Daily CBC, HL, INR  Dolly Rias RPh 08/08/2019, 1:03 PM Pager [phone]

## 2019-08-09 ENCOUNTER — Inpatient Hospital Stay (HOSPITAL_COMMUNITY): Payer: Medicare Other

## 2019-08-09 LAB — COMPREHENSIVE METABOLIC PANEL
ALT: 10 U/L (ref 0–44)
AST: 11 U/L — ABNORMAL LOW (ref 15–41)
Albumin: 2.6 g/dL — ABNORMAL LOW (ref 3.5–5.0)
Alkaline Phosphatase: 57 U/L (ref 38–126)
Anion gap: 11 (ref 5–15)
BUN: 17 mg/dL (ref 8–23)
CO2: 19 mmol/L — ABNORMAL LOW (ref 22–32)
Calcium: 8.2 mg/dL — ABNORMAL LOW (ref 8.9–10.3)
Chloride: 105 mmol/L (ref 98–111)
Creatinine, Ser: 0.84 mg/dL (ref 0.61–1.24)
GFR calc Af Amer: >60 mL/min (ref >60)
GFR calc non Af Amer: >60 mL/min (ref >60)
Glucose, Bld: 108 mg/dL — ABNORMAL HIGH (ref 70–99)
Potassium: 3.9 mmol/L (ref 3.5–5.1)
Sodium: 135 mmol/L (ref 135–145)
Total Bilirubin: 1.4 mg/dL — ABNORMAL HIGH (ref 0.3–1.2)
Total Protein: 5.8 g/dL — ABNORMAL LOW (ref 6.5–8.1)

## 2019-08-09 LAB — CULTURE, BLOOD (ROUTINE X 2): Special Requests: ADEQUATE

## 2019-08-09 LAB — CBC WITH DIFFERENTIAL/PLATELET
Abs Immature Granulocytes: 0.07 10*3/uL (ref 0.00–0.07)
Basophils Absolute: 0 10*3/uL (ref 0.0–0.1)
Basophils Relative: 1 %
Eosinophils Absolute: 0.2 10*3/uL (ref 0.0–0.5)
Eosinophils Relative: 2 %
HCT: 34 % — ABNORMAL LOW (ref 39.0–52.0)
Hemoglobin: 11.1 g/dL — ABNORMAL LOW (ref 13.0–17.0)
Immature Granulocytes: 1 %
Lymphocytes Relative: 6 %
Lymphs Abs: 0.4 10*3/uL — ABNORMAL LOW (ref 0.7–4.0)
MCH: 31.1 pg (ref 26.0–34.0)
MCHC: 32.6 g/dL (ref 30.0–36.0)
MCV: 95.2 fL (ref 80.0–100.0)
Monocytes Absolute: 0.5 10*3/uL (ref 0.1–1.0)
Monocytes Relative: 7 %
Neutro Abs: 5.5 10*3/uL (ref 1.7–7.7)
Neutrophils Relative %: 83 %
Platelets: 154 10*3/uL (ref 150–400)
RBC: 3.57 MIL/uL — ABNORMAL LOW (ref 4.22–5.81)
RDW: 15.5 % (ref 11.5–15.5)
WBC: 6.6 10*3/uL (ref 4.0–10.5)
nRBC: 0 % (ref 0.0–0.2)

## 2019-08-09 LAB — MAGNESIUM: Magnesium: 1.5 mg/dL — ABNORMAL LOW (ref 1.7–2.4)

## 2019-08-09 LAB — HEPARIN LEVEL (UNFRACTIONATED): Heparin Unfractionated: 0.31 IU/mL (ref 0.30–0.70)

## 2019-08-09 LAB — PROTIME-INR
INR: 1.9 — ABNORMAL HIGH (ref 0.8–1.2)
Prothrombin Time: 21.7 seconds — ABNORMAL HIGH (ref 11.4–15.2)

## 2019-08-09 LAB — PHOSPHORUS: Phosphorus: 3 mg/dL (ref 2.5–4.6)

## 2019-08-09 MED ORDER — WARFARIN SODIUM 5 MG PO TABS
5.0000 mg | ORAL_TABLET | Freq: Once | ORAL | Status: AC
  Administered 2019-08-09: 17:00:00 5 mg via ORAL
  Filled 2019-08-09: qty 1

## 2019-08-09 MED ORDER — MAGNESIUM SULFATE 2 GM/50ML IV SOLN
2.0000 g | Freq: Once | INTRAVENOUS | Status: AC
  Administered 2019-08-09: 2 g via INTRAVENOUS
  Filled 2019-08-09: qty 50

## 2019-08-09 NOTE — Progress Notes (Signed)
ANTICOAGULATION CONSULT NOTE - Follow Up Consult  Pharmacy Consult for Heparin Indication: VTE Treatment  Allergies  Allergen Reactions  . Codeine Hives and Other (See Comments)    Takes benadryl to stop reaction    Patient Measurements: Height: 6\' 1"  (185.4 cm) Weight: 213 lb 13.5 oz (97 kg) IBW/kg (Calculated) : 79.9 Heparin Dosing Weight:   Vital Signs: Temp: 98.5 F (36.9 C) (10/22 0000) Temp Source: Oral (10/22 0000) BP: 151/71 (10/21 1800) Pulse Rate: 119 (10/21 1800)  Labs: Recent Labs    08/06/19 0430  08/06/19 1106  08/07/19 0522  08/08/19 0156 08/08/19 0858 08/08/19 1206 08/08/19 2033  HGB 16.9  --   --   --  12.1*  --  10.4*  --   --   --   HCT 50.6  --   --   --  37.5*  --  31.5*  --   --   --   PLT 307  --   --   --  180  --  149*  --   --   --   LABPROT 19.3*  --   --   --   --   --  21.4*  --   --   --   INR 1.7*  --   --   --   --   --  1.9*  --   --   --   HEPARINUNFRC  --   --   --    < > 0.21*   < > 0.20*  --  0.21* 0.21*  CREATININE 1.45*  --   --   --  1.24  --  1.00  --   --   --   TROPONINIHS 148*   < > 254*  --   --   --   --  59* 69*  --    < > = values in this interval not displayed.    Estimated Creatinine Clearance: 79.5 mL/min (by C-G formula based on SCr of 1 mg/dL).   Medications:  Infusions:  . ceFEPime (MAXIPIME) IV Stopped (08/08/19 2144)  . heparin 2,500 Units/hr (08/09/19 0001)  . vancomycin 1,000 mg (08/09/19 0003)    Assessment: Patient with low heparin level again.  No heparin issues per RN.  Goal of Therapy:  Heparin level 0.3-0.7 units/ml Monitor platelets by anticoagulation protocol: Yes   Plan:  Increase heparin to 2700 units/hr Recheck level at 0900  Tyler Deis, Shea Stakes [place] 08/09/2019,12:26 AM

## 2019-08-09 NOTE — Evaluation (Signed)
Occupational Therapy Evaluation Patient Details Name: Tony Zhang MRN: [id number] DOB: 08-Jun-1946 Today's Date: 08/09/2019    History of Present Illness 73 yo male admitted to ED on 10/19 for PNA sespis. Pt with elevated troponin, now downtrending with cardiology documenting potential demand ischemia due to sepsis. PMH includes COPD, dyslipidemia, HTN, PVD, AAA, history of DVT and PE, bilateral renal artery stenosis, lumbar surgery.   Clinical Impression   Pt was admitted for the above.  At baseline, he lives alone and is mod I for adls. Eval was limited today by pain and pt was only agreeable to bed level evaluation. Will follow in acute setting with the goals listed below.  Anticipate he will need assistance at home vs SNF, if he is agreeable to this.    Follow Up Recommendations  SNF;Supervision/Assistance - 24 hour    Equipment Recommendations  None recommended by OT none; has 3:1 commode   Recommendations for Other Services       Precautions / Restrictions Precautions Precautions: Fall Restrictions Weight Bearing Restrictions: No      Mobility Bed Mobility               General bed mobility comments: min A with bedrail, rolling to R  Transfers                      Balance                                           ADL either performed or assessed with clinical judgement   ADL Overall ADL's : Needs assistance/impaired Eating/Feeding: Set up   Grooming: Minimal assistance   Upper Body Bathing: Moderate assistance   Lower Body Bathing: Total assistance   Upper Body Dressing : Moderate assistance   Lower Body Dressing: Total assistance                 General ADL Comments: pt only agreeable to bed level evaluation today due to pain.  He was able to roll to R with min A and rails; he was too close to L side to roll that way.  Pt reports that neck and back hurt and he has braces that he should wear but usually doesn't.  While he reports he was able to do adls prior to admission, he both stated he couldn't bend forward and reported that was how he completed ADLs.  He doesn't seem very receptive to AE when I reviewed this with him. He does have a Sports administrator at home     Vision         Perception     Praxis      Pertinent Vitals/Pain Pain Assessment: 0-10 Pain Score: 8  Pain Location: back, chest due to pna Pain Descriptors / Indicators: Sore;Discomfort Pain Intervention(s): Limited activity within patient's tolerance;Monitored during session;Repositioned     Hand Dominance     Extremity/Trunk Assessment Upper Extremity Assessment Upper Extremity Assessment: Generalized weakness;RUE deficits/detail;LUE deficits/detail RUE Deficits / Details: Pt only able to lift RUE 30 and LUE 40 FF; AAROM to 80. distal strength 3 to 3+/5           Communication     Cognition  General Comments       Exercises     Shoulder Instructions      Home Living                                          Prior Functioning/Environment                   OT Problem List: Decreased strength;Decreased activity tolerance;Decreased knowledge of use of DME or AE;Decreased knowledge of precautions;Pain;Decreased range of motion(balance NT)      OT Treatment/Interventions: Self-care/ADL training;Therapeutic exercise;DME and/or AE instruction;Therapeutic activities;Patient/family education    OT Goals(Current goals can be found in the care plan section) Acute Rehab OT Goals Patient Stated Goal: get stronger OT Goal Formulation: With patient Time For Goal Achievement: 08/23/19 Potential to Achieve Goals: Good ADL Goals Pt Will Transfer to Toilet: with min assist;ambulating;bedside commode Additional ADL Goal #1: pt will gather clothes with reacher with min guard and perform adl without assistance or supervision and using AE as  needed Additional ADL Goal #2: pt will be independent with written A/AAROM HEP  OT Frequency: Min 2X/week   Barriers to D/C:            Co-evaluation              AM-PAC OT "6 Clicks" Daily Activity     Outcome Measure Help from another person eating meals?: A Little Help from another person taking care of personal grooming?: A Little Help from another person toileting, which includes using toliet, bedpan, or urinal?: A Lot Help from another person bathing (including washing, rinsing, drying)?: A Lot Help from another person to put on and taking off regular upper body clothing?: A Lot Help from another person to put on and taking off regular lower body clothing?: Total 6 Click Score: 13   End of Session    Activity Tolerance: Patient limited by pain Patient left: in bed;with call bell/phone within reach;with bed alarm set  OT Visit Diagnosis: Muscle weakness (generalized) (M62.81)                Time: 8588-5027 OT Time Calculation (min): 20 min Charges:  OT General Charges $OT Visit: 1 Visit OT Evaluation $OT Eval Low Complexity: [place], OTR/L Acute Rehabilitation Services [phone] [facility] pager [phone] office 08/09/2019  [place] 08/09/2019, 2:05 PM

## 2019-08-09 NOTE — Progress Notes (Signed)
PT Cancellation Note  Patient Details Name: Tony Zhang MRN: [id number] DOB: Jan 29, 1946   Cancelled Treatment:     pt working with OT on arrival to unit.  I was unable to return in pm due to schedule.  Will attempt to see another day.   Nathanial Rancher 08/09/2019, 1:15 PM

## 2019-08-09 NOTE — Progress Notes (Signed)
Pt refusing cpt

## 2019-08-09 NOTE — Evaluation (Signed)
Clinical/Bedside Swallow Evaluation Patient Details  Name: Tony Zhang MRN: [id number] Date of Birth: 12/23/1945  Today's Date: 08/09/2019 Time: SLP Start Time (ACUTE ONLY): 1500 SLP Stop Time (ACUTE ONLY): 1520 SLP Time Calculation (min) (ACUTE ONLY): 20 min  Past Medical History:  Past Medical History:  Diagnosis Date  . AAA (abdominal aortic aneurysm) ([place])   . Blood dyscrasia    followed by Dr. Benay Spice, for clotting issue, has been on Coumadin for about 20 yrs.    [name] COPD (chronic obstructive pulmonary disease) ([place])   . DJD (degenerative joint disease)   . Dyslipidemia   . Gout   . Malignant hypertension   . Peripheral vascular disease ([place])   . Pneumonia 08/2013   pt. reports that he is having this surgery 11/25/2014- for the lung problem that began with pneumonia in 09/2014  . Shortness of breath   . Venous thrombosis    Recurrent   Past Surgical History:  Past Surgical History:  Procedure Laterality Date  . APPENDECTOMY    . BACK SURGERY    . COLONOSCOPY N/A 04/13/2015   Procedure: COLONOSCOPY;  Surgeon: Laurence Spates, MD;  Location: [facility] ENDOSCOPY;  Service: Endoscopy;  Laterality: N/A;  . EMPYEMA DRAINAGE N/A 11/25/2014   Procedure: EMPYEMA DRAINAGE;  Surgeon: Grace Isaac, MD;  Location: [place];  Service: Thoracic;  Laterality: N/A;  . ESOPHAGOGASTRODUODENOSCOPY N/A 04/10/2015   Procedure: ESOPHAGOGASTRODUODENOSCOPY (EGD);  Surgeon: Wilford Corner, MD;  Location[facility] ENDOSCOPY;  Service: Endoscopy;  Laterality: N/A;  . EYE SURGERY     both eyes, cataracts removed, denies lens implants  . FLEXIBLE SIGMOIDOSCOPY N/A 04/12/2015   Procedure: FLEXIBLE SIGMOIDOSCOPY;  Surgeon: Laurence Spates, MD;  Location: [place];  Service: Endoscopy;  Laterality: N/A;  . HERNIA REPAIR    . SHOULDER SURGERY Right   . VIDEO ASSISTED THORACOSCOPY Right 11/25/2014   Procedure: VIDEO ASSISTED THORACOSCOPY;  Surgeon: Grace Isaac, MD;  Location: [place];  Service: Thoracic;   Laterality: Right;  [name] VIDEO BRONCHOSCOPY N/A 11/25/2014   Procedure: VIDEO BRONCHOSCOPY;  Surgeon: Grace Isaac, MD;  Location[facility];  Service: Thoracic;  Laterality: N/A;   HPI:  73 yo male admitted to ED on 10/19 for PNA sespis. Pt with elevated troponin, now downtrending with cardiology documenting potential demand ischemia due to sepsis. PMH includes COPD, dyslipidemia, HTN, PVD, AAA, history of DVT and PE, bilateral renal artery stenosis, lumbar surgery.  Most recent CXR on 10/22 revealed "bilateral lung opacities, with slight improvement in the left lung and right upper lung zone, but slight worsening at the right lung base."  Pt denied hx of dysphagia.    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  Pt was observed with trials of thin liquid, puree, and regular solids.  He reported hx of reflux; however stated that he is unsure if he takes medicine to manage it at home.  Pt exhibited clinical s/sx of aspiration with trials of thin liquid evidenced via a delayed cough following 1/5 trials; however, pt presented with a baseline cough upon arrival and throughout this evaluation, therefore difficult to determine if cough was related to po trials.  Pt exhibited prolonged mastication and AP transport of regular solid trial; however, no clinical s/sx of aspiration were observed with puree or regular solid trials.  Pt reported occasional globus sensation during po consumption and he stated that he has "bad episodes of heart burn every now and  then".  Recommend consideration of a GI consult to further evaluate.  Additionally recommend Dysphagia 3 (mech soft) solids and continuation of thin liquid with use of aspiration and reflux precautions.  Speech Therapy will continue to follow for diagnostic treatment and to monitor diet tolerance.    SLP Visit Diagnosis: Dysphagia, unspecified (R13.10)    Aspiration Risk  Mild aspiration  risk    Diet Recommendation Dysphagia 3 (Mech soft);Thin liquid   Liquid Administration via: Cup;Straw Medication Administration: Whole meds with liquid Supervision: Patient able to self feed;Intermittent supervision to cue for compensatory strategies Compensations: Minimize environmental distractions;Slow rate;Small sips/bites Postural Changes: Seated upright at 90 degrees;Remain upright for at least 30 minutes after po intake    Other  Recommendations Recommended Consults: Consider GI evaluation Oral Care Recommendations: Oral care BID   Follow up Recommendations (TBD)      Frequency and Duration min 2x/week  2 weeks       Prognosis Prognosis for Safe Diet Advancement: Good      Swallow Study   General HPI: 73 yo male admitted to ED on 10/19 for PNA sespis. Pt with elevated troponin, now downtrending with cardiology documenting potential demand ischemia due to sepsis. PMH includes COPD, dyslipidemia, HTN, PVD, AAA, history of DVT and PE, bilateral renal artery stenosis, lumbar surgery.  Most recent CXR on 10/22 revealed "bilateral lung opacities, with slight improvement in the left lung and right upper lung zone, but slight worsening at the right lung base."  Pt denied hx of dysphagia.  Type of Study: Bedside Swallow Evaluation Previous Swallow Assessment: None Diet Prior to this Study: Regular;Thin liquids Temperature Spikes Noted: No Respiratory Status: Nasal cannula History of Recent Intubation: No Behavior/Cognition: Alert;Cooperative;Pleasant mood Oral Cavity Assessment: Within Functional Limits Oral Care Completed by SLP: No Oral Cavity - Dentition: Edentulous Vision: Functional for self-feeding Self-Feeding Abilities: Able to feed self Patient Positioning: Upright in bed Baseline Vocal Quality: Normal Volitional Swallow: Able to elicit    Oral/Motor/Sensory Function Overall Oral Motor/Sensory Function: Within functional limits   Ice Chips Ice chips: Not tested    Thin Liquid Thin Liquid: Impaired Presentation: Cup;Straw Pharyngeal  Phase Impairments: Cough - Delayed    Nectar Thick Nectar Thick Liquid: Not tested   Honey Thick Honey Thick Liquid: Not tested   Puree Puree: Within functional limits Presentation: Spoon   Solid     Solid: Impaired Presentation: Self Fed Oral Phase Impairments: Impaired mastication Oral Phase Functional Implications: Prolonged oral transit;Impaired mastication     Eino Farber, M.S., CCC-SLP Acute Rehabilitation Services  Shanon [name] Maryam Feely 08/09/2019,3:31 PM

## 2019-08-09 NOTE — Progress Notes (Signed)
Pt declined cpt at this time. 

## 2019-08-09 NOTE — Progress Notes (Signed)
ANTICOAGULATION CONSULT NOTE -   Pharmacy Consult for heparin/coumadin Indication: VTE treatment  Allergies  Allergen Reactions  . Codeine Hives and Other (See Comments)    Takes benadryl to stop reaction    Patient Measurements: Height: 6\' 1"  (185.4 cm) Weight: 214 lb 15.2 oz (97.5 kg) IBW/kg (Calculated) : 79.9 Heparin Dosing Weight:   Vital Signs: Temp: 97.7 F (36.5 C) (10/22 0800) Temp Source: Oral (10/22 0800) BP: 125/62 (10/22 1000) Pulse Rate: 100 (10/22 1000)  Labs: Recent Labs    08/07/19 0522  08/08/19 0156 08/08/19 0858 08/08/19 1206 08/08/19 2033 08/09/19 0236 08/09/19 0923  HGB 12.1*  --  10.4*  --   --   --  11.1*  --   HCT 37.5*  --  31.5*  --   --   --  34.0*  --   PLT 180  --  149*  --   --   --  154  --   LABPROT  --   --  21.4*  --   --   --  21.7*  --   INR  --   --  1.9*  --   --   --  1.9*  --   HEPARINUNFRC 0.21*   < > 0.20*  --  0.21* 0.21*  --  0.31  CREATININE 1.24  --  1.00  --   --   --  0.84  --   TROPONINIHS  --   --   --  59* 69*  --   --   --    < > = values in this interval not displayed.    Estimated Creatinine Clearance: 94.8 mL/min (by C-G formula based on SCr of 0.84 mg/dL).   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  Pharmacy consulted to dose heparin, coumadin resumed 10/20.   PTA coumadin 4mg  po daily, last dose 10/18 at 1600.  08/09/2019 -Heparin level low end of therapeutic 0.31 -Per discussion with RN no bleeding and no interruptions with infusion -Hgb 11.1, plts 154 - INR 1.9   Goal of Therapy:  Heparin level 0.3-0.7 units/ml Monitor platelets by anticoagulation protocol   Plan:  Increase heparin to 2800 units/hr Coumadin  5 mg po x 1 dose Daily CBC, HL, INR  Dolly Rias RPh 08/09/2019, 11:07 AM Pager [phone]

## 2019-08-09 NOTE — Progress Notes (Signed)
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  Patient reports increasing cough with shortness of breath congestion.  Complaint of left-sided chest pain worsened on deep breathing, subjective fevers and chills. In the ED he was in septic shock with hypoxic respiratory failure (O2 sats 77% on room air), fever 102.6 F, tachycardic, hypotensive, lactic acid of 2.8, WC of 15 .9K and AKI with creatinine 1.45.  Also had elevated troponin with no ischemic changes on EKG.  Chest x-ray showed patchy groundglass opacities in the left mid and lower lung lobe concerning for multifocal pneumonia.  Was placed on nonrebreather and started empiric antibiotic.  COVID-19 was tested negative. Admitted to stepdown unit for septic shock.  PCCM involved in care.    **Interim History A few nights ago he was hypotensive briefly requiring Levophed and was weaned off of it yesterday morning.  Blood pressures remained stable but he still complains of cough and left-sided chest pain on inspiration and felt it is a deep stabbing pain.  Still on supplemental oxygen via nasal cannula and is improving slightly and is suctioning his has helped. HR has been improving.    Assessment & Plan:   Principal Problem:   Sepsis due to pneumonia [facility](HCC) Active Problems:   DVT, lower extremity and pulmonary embolism, recurrent   Hyperlipidemia   Gout   COPD (chronic obstructive pulmonary disease) (HCC)   Multifocal pneumonia   AKI (acute kidney injury) (HCC)   Pressure injury of skin   Acute respiratory failure with hypoxia (HCC)   Demand ischemia  (HCC)  Severe sepsis with septic shock (HCC) Acute respiratory failure with hypoxemia) -Secondary to multifocal pneumonia.  Initially on NRB, transitioned to 6 L via nasal cannula.  Also required pressors briefly during the night for hypotension.  Now stable. -Continued IV fluids and now off,  -C/w empiric Cefepime. Discontinue IV Vancomycin.  Follow blood cultures. Supportive care with antitussives and Vicodin for pain.  Continue as needed nebs -Wean oxygen as tolerated, incentive spirometry. -Continue stepdown monitoring.  WBC and subsequent lactic acid improving -Continue to Monitor for Infection  -CXR this AM showed "Makes progression of bilateral lung opacities, with slight improvement in the left lung and right upper lung zone, but slight worsening at the right lung base." -Check SLP Evaluation and he was placed on a Dysphagia 3 Diet with Thin Liquids  Left-sided pleuritic chest pain, improved.  -Likely pleurisy from multifocal pneumonia.   -Bedside ultrasound was done showing small amount of pleural fluid without effusion or loculation. -Pain control with Vicodin and given a 1x dose of Morphine.  Empiric antibiotics, DuoNeb and Vicodin as needed for pain. -Will obtain CT of the chest if respiratory symptoms unimproved and has persistent pain but it is improved today  DVT, lower extremity and pulmonary embolism, recurrent -INR was subtherapeutic.  Markedly elevated D-dimer.  Given presentation of septic shock with multifocal pneumonia respiratory symptoms are likely due to infection rather than PE. -Coumadin continued with IV heparin bridge. INR was 1.9 -Doppler lower extremity negative for DVT.  Elevated troponin/history of CAD -No EKG changes.  Troponin trending down and last Two Troponin was 59 and 69.  Cardiology consult appreciated and feel it is pleuritic.  Suspect demand ischemia. -Recent 2D echo 1 month back showing normal EF and impaired LV relaxation -Cardiology did  not feel there is any acute cardiac concerns at this time and have signed off the case  Hyperlipidemia/PAD -Lipid panel was checked and showed a total cholesterol/HDL ratio of 2.6, cholesterol level of 71, HDL 27, LDL 20, triglycerides 119, VLDL 24 -Continue statin and cilostazol    COPD (chronic obstructive pulmonary disease) (HCC) -Continue scheduled nebs.  Not in acute exacerbation -Continue Supplemental O2 via [place]    AKI (acute kidney injury) (HCC) -ATN secondary to septic shock.  Improving a.m. labs with fluids and pressors  Leukocytosis -Improving. WBC went from 13.5 -> 9.3 -> 6.6 -C/w Abx as delineated as above -Continue to Monitor and trend -Procalcitonin is trending down  Normocytic Anemia -Patient's hemoglobin/hematocrit went from 12.1/37.5 is now 11.1/34.0 -Check anemia panel in the a.m. -Continue monitor for signs and symptoms of bleeding; currently no overt bleeding noted-repeat CBC in a.m.  Thrombocytopenia The patient's platelet count went from 180,000 and is now 154,000 -Continue monitor as patient is on a heparin drip and anticoagulated warfarin Continue monitor for signs and symptoms of bleeding -If continues to drop further will need to consider HIT panel if clinically do not feel he has HIT -To monitor and trend and repeat CBC  Hypomagnesemia -Patient magnesium level was extremely low at 1.5 -Continue monitor and replete with with IV mag sulfate 2 g -Repeat magnesium level in the a.m.  Hypocalcemia -Patient's calcium level 8.2 and adjusted for albumin is a little bit higher -Continue to monitor and trend and repeat CMP in the a.m.  HyperBilirubinemia -Mild at 1.4 -Continue to Monitor and trend repeat CMP in a.m.  DVT prophylaxis: Heparin gtt bridge with Coumadin  Code Status: FULL CODE Family Communication: No family present at bedside  Disposition Plan: Transferred to Medical Floor as he was stable  Consultants:   Cardiology  PCCM/Pulmonary     Procedures:    Antimicrobials:  Anti-infectives (From admission, onward)   Start     Dose/Rate Route Frequency Ordered Stop   08/08/19 1400  ceFEPIme (MAXIPIME) 2 g in sodium chloride 0.9 % 100 mL IVPB     2 g 200 mL/hr over 30 Minutes Intravenous Every 8 hours 08/08/19 0753     08/08/19 1200  vancomycin (VANCOCIN) IVPB 1000 mg/200 mL premix  Status:  Discontinued     1,000 mg 200 mL/hr over 60 Minutes Intravenous Every 12 hours 08/08/19 1053 08/09/19 0934   08/07/19 1600  vancomycin (VANCOCIN) 1,500 mg in sodium chloride 0.9 % 500 mL IVPB  Status:  Discontinued     1,500 mg 250 mL/hr over 120 Minutes Intravenous Every 24 hours 08/06/19 1054 08/08/19 1053   08/06/19 1800  ceFEPIme (MAXIPIME) 2 g in sodium chloride 0.9 % 100 mL IVPB  Status:  Discontinued     2 g 200 mL/hr over 30 Minutes Intravenous Every 12 hours 08/06/19 0938 08/08/19 0753   08/06/19 1000  ceFEPIme (MAXIPIME) 2 g in sodium chloride 0.9 % 100 mL IVPB  Status:  Discontinued     2 g 200 mL/hr over 30 Minutes Intravenous Every 12 hours 08/06/19 0837 08/06/19 0938   08/06/19 0900  vancomycin (VANCOCIN) 1,500 mg in sodium chloride 0.9 % 500 mL IVPB     1,500 mg 250 mL/hr over 120 Minutes Intravenous  Once 08/06/19 0832 08/06/19 1137   08/06/19 0800  cefTRIAXone (ROCEPHIN)  2 g in sodium chloride 0.9 % 100 mL IVPB  Status:  Discontinued     2 g 200 mL/hr over 30 Minutes Intravenous Every 24 hours 08/06/19 0623 08/06/19 0816   08/06/19 0630  azithromycin (ZITHROMAX) 500 mg in sodium chloride 0.9 % 250 mL IVPB  Status:  Discontinued     500 mg 250 mL/hr over 60 Minutes Intravenous Daily 08/06/19 0623 08/06/19 0816   08/06/19 0530  vancomycin (VANCOCIN) 2,000 mg in sodium chloride 0.9 % 500 mL IVPB     2,000 mg 250 mL/hr over 120 Minutes Intravenous  Once 08/06/19 0448 08/06/19 0735   08/06/19 0500  ceFEPIme (MAXIPIME) 2 g in sodium chloride 0.9 % 100 mL IVPB     2 g 200 mL/hr over 30 Minutes Intravenous NOW 08/06/19  0447 08/06/19 0536     Subjective: Seen and examined at bedside and states that he is still short of breath but his chest pain is improved.  States that using the Yankauer to suction his sputum has helped significantly.  No chest pain, lightheadedness or dizziness but nursing reports there is some mild hemoptysis from poking himself from his Yankauer no nausea or vomiting.  No other concerns reported at this time and happy that his numbers are improving.  Objective: Vitals:   08/09/19 1200 08/09/19 1405 08/09/19 1616 08/09/19 1800  BP: 138/76 (!) 152/68  (!) 164/74  Pulse: (!) 102 (!) 103  100  Resp: 20 17    Temp: 98.4 F (36.9 C)  98.7 F (37.1 C)   TempSrc: Oral  Oral   SpO2: 92% 92%  95%  Weight:      Height:        Intake/Output Summary (Last 24 hours) at 08/09/2019 1324 Last data filed at 08/09/2019 1448 Gross per 24 hour  Intake 1152.25 ml  Output 1450 ml  Net -297.75 ml   Filed Weights   08/07/19 0500 08/08/19 0338 08/09/19 0500  Weight: 94.7 kg 97 kg 97.5 kg   Examination: Physical Exam:  Constitutional: WN/WD overweight Caucasian male in NAD and appears calm but uncomfortable again Eyes: Lids and conjunctivae normal, sclerae anicteric  ENMT: External Ears, Nose appear normal. Grossly normal hearing. Mucous membranes are moist Neck: Appears normal, supple, no cervical masses, normal ROM, no appreciable thyromegaly; no JVD Respiratory: Diminished to auscultation bilaterally, no wheezing, rales, rhonchi or crackles. Normal respiratory effort and patient is not tachypenic. No accessory muscle use.  Cardiovascular: Tachycardic Rate but regular rhythm, no murmurs / rubs / gallops. S1 and S2 auscultated. Trace extremity edema. Abdomen: Soft, mildly tender, Distended. No masses palpated. No appreciable hepatosplenomegaly. Bowel sounds positive x4.  GU: Deferred. Musculoskeletal: No clubbing / cyanosis of digits/nails. No joint deformity upper and lower extremities Skin:  No rashes, lesions, ulcers on a limited skin evaluation. No induration; Warm and dry.  Neurologic: CN 2-12 grossly intact with no focal deficits. Romberg sign and cerebellar reflexes not assessed.  Psychiatric: Normal judgment and insight. Alert and oriented x 3. Anxious mood and appropriate affect.   Data Reviewed: I have personally reviewed following labs and imaging studies  CBC: Recent Labs  Lab 08/06/19 0430 08/07/19 0522 08/08/19 0156 08/09/19 0236  WBC 15.9* 13.5* 9.3 6.6  NEUTROABS 14.3*  --   --  5.5  HGB 16.9 12.1* 10.4* 11.1*  HCT 50.6 37.5* 31.5* 34.0*  MCV 93.2 97.2 94.3 95.2  PLT 307 180 149* 154   Basic Metabolic Panel: Recent Labs  Lab 08/06/19 0430 08/07/19  0522 08/08/19 0156 08/09/19 0236  NA 136 136 134* 135  K 4.0 3.8 3.5 3.9  CL 103 108 106 105  CO2 21* 18* 17* 19*  GLUCOSE 105* 94 112* 108*  BUN 20 26* 25* 17  CREATININE 1.45* 1.24 1.00 0.84  CALCIUM 9.4 7.6* 7.7* 8.2*  MG  --   --  1.2* 1.5*  PHOS  --   --  2.7 3.0   GFR: Estimated Creatinine Clearance: 94.8 mL/min (by C-G formula based on SCr of 0.84 mg/dL). Liver Function Tests: Recent Labs  Lab 08/06/19 0430 08/08/19 0156 08/09/19 0236  AST 21 13* 11*  ALT 11 11 10   ALKPHOS 71 57 57  BILITOT 1.1 1.0 1.4*  PROT 7.4 5.4* 5.8*  ALBUMIN 4.0 2.6* 2.6*   No results for input(s): LIPASE, AMYLASE in the last 168 hours. No results for input(s): AMMONIA in the last 168 hours. Coagulation Profile: Recent Labs  Lab 08/06/19 0430 08/08/19 0156 08/09/19 0236  INR 1.7* 1.9* 1.9*   Cardiac Enzymes: No results for input(s): CKTOTAL, CKMB, CKMBINDEX, TROPONINI in the last 168 hours. BNP (last 3 results) No results for input(s): PROBNP in the last 8760 hours. HbA1C: Recent Labs    08/07/19 0522  HGBA1C 5.0   CBG: No results for input(s): GLUCAP in the last 168 hours. Lipid Profile: Recent Labs    08/07/19 0522  CHOL 71  HDL 27*  LDLCALC 20  TRIG 119  CHOLHDL 2.6   Thyroid  Function Tests: No results for input(s): TSH, T4TOTAL, FREET4, T3FREE, THYROIDAB in the last 72 hours. Anemia Panel: No results for input(s): VITAMINB12, FOLATE, FERRITIN, TIBC, IRON, RETICCTPCT in the last 72 hours. Sepsis Labs: Recent Labs  Lab 08/06/19 0430 08/06/19 0839 08/06/19 1106 08/07/19 0522 08/08/19 0156  PROCALCITON  --  15.48  --  9.91 5.20  LATICACIDVEN 2.8* 1.8 2.1*  --   --     Recent Results (from the past 240 hour(s))  SARS Coronavirus 2 by RT PCR (hospital order, performed in [facility] hospital lab) Nasopharyngeal Nasopharyngeal Swab     Status: None   Collection Time: 08/06/19  4:09 AM   Specimen: Nasopharyngeal Swab  Result Value Ref Range Status   SARS Coronavirus 2 NEGATIVE NEGATIVE Final    Comment: (NOTE) If result is NEGATIVE SARS-CoV-2 target nucleic acids are NOT DETECTED. The SARS-CoV-2 RNA is generally detectable in upper and lower  respiratory specimens during the acute phase of infection. The lowest  concentration of SARS-CoV-2 viral copies this assay can detect is 250  copies / mL. A negative result does not preclude SARS-CoV-2 infection  and should not be used as the sole basis for treatment or other  patient management decisions.  A negative result may occur with  improper specimen collection / handling, submission of specimen other  than nasopharyngeal swab, presence of viral mutation(s) within the  areas targeted by this assay, and inadequate number of viral copies  (<250 copies / mL). A negative result must be combined with clinical  observations, patient history, and epidemiological information. If result is POSITIVE SARS-CoV-2 target nucleic acids are DETECTED. The SARS-CoV-2 RNA is generally detectable in upper and lower  respiratory specimens dur ing the acute phase of infection.  Positive  results are indicative of active infection with SARS-CoV-2.  Clinical  correlation with patient history and other diagnostic information is   necessary to determine patient infection status.  Positive results do  not rule out bacterial infection or co-infection with  other viruses. If result is PRESUMPTIVE POSTIVE SARS-CoV-2 nucleic acids MAY BE PRESENT.   A presumptive positive result was obtained on the submitted specimen  and confirmed on repeat testing.  While 2019 novel coronavirus  (SARS-CoV-2) nucleic acids may be present in the submitted sample  additional confirmatory testing may be necessary for epidemiological  and / or clinical management purposes  to differentiate between  SARS-CoV-2 and other Sarbecovirus currently known to infect humans.  If clinically indicated additional testing with an alternate test  methodology [phone]) is advised. The SARS-CoV-2 RNA is generally  detectable in upper and lower respiratory sp ecimens during the acute  phase of infection. The expected result is Negative. Fact Sheet for Patients:  BoilerBrush.com.cy Fact Sheet for Healthcare Providers: https://pope.com/ This test is not yet approved or cleared by the [place] FDA and has been authorized for detection and/or diagnosis of SARS-CoV-2 by FDA under an Emergency Use Authorization (EUA).  This EUA will remain in effect (meaning this test can be used) for the duration of the COVID-19 declaration under Section 564(b)(1) of the Act, 21 U.S.C. section 360bbb-3(b)(1), unless the authorization is terminated or revoked sooner. Performed at [facility], [address] [place], [place] [address]   Blood culture (routine x 2)     Status: None (Preliminary result)   Collection Time: 08/06/19  4:30 AM   Specimen: BLOOD RIGHT HAND  Result Value Ref Range Status   Specimen Description BLOOD RIGHT HAND  Final   Special Requests   Final    BOTTLES DRAWN AEROBIC AND ANAEROBIC Blood Culture adequate volume   Culture   Final    NO GROWTH 3 DAYS Performed at [facility], [address], [place], [place] [address]    Report Status PENDING  Incomplete  Blood culture (routine x 2)     Status: Abnormal   Collection Time: 08/06/19  4:30 AM   Specimen: BLOOD  Result Value Ref Range Status   Specimen Description BLOOD RIGHT ANTECUBITAL  Final   Special Requests   Final    BOTTLES DRAWN AEROBIC AND ANAEROBIC Blood Culture adequate volume   Culture  Setup Time   Final    GRAM POSITIVE COCCI CRITICAL RESULT CALLED TO, READ BACK BY AND VERIFIED WITH: J GRIMSLEY PHARMD 08/07/19 0141 JDW IN BOTH AEROBIC AND ANAEROBIC BOTTLES    Culture (A)  Final    STAPHYLOCOCCUS SPECIES (COAGULASE NEGATIVE) THE SIGNIFICANCE OF ISOLATING THIS ORGANISM FROM A SINGLE SET OF BLOOD CULTURES WHEN MULTIPLE SETS ARE DRAWN IS UNCERTAIN. PLEASE NOTIFY THE MICROBIOLOGY DEPARTMENT WITHIN ONE WEEK IF SPECIATION AND SENSITIVITIES ARE REQUIRED. Performed at [facility], [address], [place], [place] [address]    Report Status 08/09/2019 FINAL  Final  Culture, sputum-assessment     Status: None   Collection Time: 08/06/19  6:23 AM   Specimen: Sputum  Result Value Ref Range Status   Specimen Description SPUTUM  Final   Special Requests NONE  Final   Sputum evaluation   Final    THIS SPECIMEN IS ACCEPTABLE FOR SPUTUM CULTURE Performed at [facility], [address] [place], [place] [address]    Report Status 08/07/2019 FINAL  Final  Culture, respiratory     Status: None (Preliminary result)   Collection Time: 08/06/19  6:23 AM   Specimen: SPU  Result Value Ref Range Status   Specimen Description   Final    SPUTUM Performed at [facility], [address], [place], [place] [address]    Special Requests   Final    NONE Reflexed from [id number] Performed at [facility], [address], [place], [place] [address]    Gram Stain   Final    ABUNDANT WBC PRESENT,BOTH PMN AND MONONUCLEAR FEW GRAM POSITIVE COCCI FEW  GRAM POSITIVE RODS    Culture   Final    CULTURE REINCUBATED FOR BETTER GROWTH Performed at [facility], [address], [place], [place] [address]    Report Status PENDING  Incomplete  Culture, Urine     Status: Abnormal   Collection Time: 08/06/19  8:36 AM   Specimen: Urine, Clean Catch  Result Value Ref Range Status   Specimen Description   Final    URINE, CLEAN CATCH Performed at [facility], [address] [place], [place] [address]    Special Requests   Final    NONE Performed at [facility], [address] [place], [place] [address]    Culture (A)  Final    <10,000 COLONIES/mL INSIGNIFICANT GROWTH Performed at [facility], [address] [place], [place] [address]    Report Status 08/07/2019 FINAL  Final  MRSA PCR Screening     Status: None   Collection Time: 08/07/19  9:30 PM   Specimen: Nasal Mucosa; Nasopharyngeal  Result Value Ref Range Status   MRSA by PCR NEGATIVE NEGATIVE Final    Comment:        The GeneXpert MRSA Assay (FDA approved for NASAL specimens only), is one component of a comprehensive MRSA colonization surveillance program. It is not intended to diagnose MRSA infection nor to guide or monitor treatment for MRSA infections. Performed at [facility], [address], [place], [place] [address]      RN Pressure Injury Documentation: Pressure Injury 08/06/19 Sacrum Mid Stage I -  Intact skin with non-blanchable redness of a localized area usually over a bony prominence. redness on sacrum (Active)  08/06/19 0844  Location: Sacrum  Location Orientation: Mid  Staging: Stage I -  Intact skin with non-blanchable redness of a localized area usually over a bony prominence.  Wound Description (Comments): redness on sacrum  Present on Admission: Yes    Radiology Studies: Dg Chest Port 1 View  Result Date: 08/09/2019 CLINICAL DATA:  Shortness of breath. EXAM: PORTABLE CHEST  1 VIEW COMPARISON:  08/06/2019. FINDINGS: Slight improvement in left lung and right upper lung zone opacities from prior exam, but slight worsening at the right lung base. Emphysema. Unchanged heart size and mediastinal contours. No large pleural effusion, however lung bases not included in the field of view. Linear opacity in the right mid lung may be fluid in the fissure or atelectasis. No pneumothorax. IMPRESSION: Makes progression of bilateral lung opacities, with slight improvement in the left lung and right upper lung zone, but slight worsening at the right lung base. Electronically Signed   By: Narda Rutherford M.D.   On: 08/09/2019 05:51    Scheduled Meds: . allopurinol  300 mg Oral Daily  . Chlorhexidine Gluconate Cloth  6 each Topical Daily  . cilostazol  100 mg Oral BID  . diazepam  2 mg Oral QPM  . guaiFENesin  600 mg Oral BID  . HYDROcodone-acetaminophen  1 tablet Oral Q4H  . ipratropium-albuterol  3 mL Nebulization QID  . mouth rinse  15 mL Mouth Rinse BID  . nebivolol  10 mg  Oral Daily  . pantoprazole  40 mg Oral BID  . simvastatin  40 mg Oral q1800  . Warfarin - Pharmacist Dosing Inpatient   Does not apply q1800   Continuous Infusions: . ceFEPime (MAXIPIME) IV Stopped (08/09/19 1433)  . heparin 2,800 Units/hr (08/09/19 1448)    LOS: 3 days   Merlene Laughter, DO [facility] PAGER is on AMION  If 7PM-7AM, please contact night-coverage [email or web address] Password [facility] 08/09/2019, 7:23 PM

## 2019-08-10 ENCOUNTER — Inpatient Hospital Stay (HOSPITAL_COMMUNITY): Payer: Medicare Other

## 2019-08-10 LAB — COMPREHENSIVE METABOLIC PANEL
ALT: 10 U/L (ref 0–44)
AST: 14 U/L — ABNORMAL LOW (ref 15–41)
Albumin: 2.8 g/dL — ABNORMAL LOW (ref 3.5–5.0)
Alkaline Phosphatase: 68 U/L (ref 38–126)
Anion gap: 12 (ref 5–15)
BUN: 13 mg/dL (ref 8–23)
CO2: 19 mmol/L — ABNORMAL LOW (ref 22–32)
Calcium: 9 mg/dL (ref 8.9–10.3)
Chloride: 103 mmol/L (ref 98–111)
Creatinine, Ser: 0.84 mg/dL (ref 0.61–1.24)
GFR calc Af Amer: >60 mL/min (ref >60)
GFR calc non Af Amer: >60 mL/min (ref >60)
Glucose, Bld: 97 mg/dL (ref 70–99)
Potassium: 3.9 mmol/L (ref 3.5–5.1)
Sodium: 134 mmol/L — ABNORMAL LOW (ref 135–145)
Total Bilirubin: 1 mg/dL (ref 0.3–1.2)
Total Protein: 6.4 g/dL — ABNORMAL LOW (ref 6.5–8.1)

## 2019-08-10 LAB — CBC WITH DIFFERENTIAL/PLATELET
Abs Immature Granulocytes: 0.16 10*3/uL — ABNORMAL HIGH (ref 0.00–0.07)
Basophils Absolute: 0.1 10*3/uL (ref 0.0–0.1)
Basophils Relative: 1 %
Eosinophils Absolute: 0.2 10*3/uL (ref 0.0–0.5)
Eosinophils Relative: 2 %
HCT: 39.1 % (ref 39.0–52.0)
Hemoglobin: 12.6 g/dL — ABNORMAL LOW (ref 13.0–17.0)
Immature Granulocytes: 2 %
Lymphocytes Relative: 5 %
Lymphs Abs: 0.4 10*3/uL — ABNORMAL LOW (ref 0.7–4.0)
MCH: 30.6 pg (ref 26.0–34.0)
MCHC: 32.2 g/dL (ref 30.0–36.0)
MCV: 94.9 fL (ref 80.0–100.0)
Monocytes Absolute: 0.6 10*3/uL (ref 0.1–1.0)
Monocytes Relative: 9 %
Neutro Abs: 5.7 10*3/uL (ref 1.7–7.7)
Neutrophils Relative %: 81 %
Platelets: 182 10*3/uL (ref 150–400)
RBC: 4.12 MIL/uL — ABNORMAL LOW (ref 4.22–5.81)
RDW: 15.2 % (ref 11.5–15.5)
WBC: 7.1 10*3/uL (ref 4.0–10.5)
nRBC: 0 % (ref 0.0–0.2)

## 2019-08-10 LAB — IRON AND TIBC
Iron: 31 ug/dL — ABNORMAL LOW (ref 45–182)
Saturation Ratios: 18 % (ref 17.9–39.5)
TIBC: 171 ug/dL — ABNORMAL LOW (ref 250–450)
UIBC: 140 ug/dL

## 2019-08-10 LAB — FERRITIN: Ferritin: 567 ng/mL — ABNORMAL HIGH (ref 24–336)

## 2019-08-10 LAB — CULTURE, RESPIRATORY W GRAM STAIN

## 2019-08-10 LAB — VITAMIN B12: Vitamin B-12: 53 pg/mL — ABNORMAL LOW (ref 180–914)

## 2019-08-10 LAB — RETICULOCYTES
Immature Retic Fract: 21.7 % — ABNORMAL HIGH (ref 2.3–15.9)
RBC.: 4.12 MIL/uL — ABNORMAL LOW (ref 4.22–5.81)
Retic Count, Absolute: 82.8 10*3/uL (ref 19.0–186.0)
Retic Ct Pct: 2 % (ref 0.4–3.1)

## 2019-08-10 LAB — PROTIME-INR
INR: 2.5 — ABNORMAL HIGH (ref 0.8–1.2)
Prothrombin Time: 26.7 seconds — ABNORMAL HIGH (ref 11.4–15.2)

## 2019-08-10 LAB — HEPARIN LEVEL (UNFRACTIONATED): Heparin Unfractionated: 0.47 IU/mL (ref 0.30–0.70)

## 2019-08-10 LAB — MAGNESIUM: Magnesium: 1.6 mg/dL — ABNORMAL LOW (ref 1.7–2.4)

## 2019-08-10 LAB — PHOSPHORUS: Phosphorus: 3 mg/dL (ref 2.5–4.6)

## 2019-08-10 LAB — FOLATE: Folate: 15 ng/mL (ref >5.9)

## 2019-08-10 MED ORDER — MAGNESIUM SULFATE 2 GM/50ML IV SOLN
2.0000 g | Freq: Once | INTRAVENOUS | Status: AC
  Administered 2019-08-10: 2 g via INTRAVENOUS
  Filled 2019-08-10: qty 50

## 2019-08-10 MED ORDER — WARFARIN SODIUM 4 MG PO TABS
4.0000 mg | ORAL_TABLET | Freq: Once | ORAL | Status: AC
  Administered 2019-08-10: 17:00:00 4 mg via ORAL
  Filled 2019-08-10: qty 1

## 2019-08-10 MED ORDER — ALUM & MAG HYDROXIDE-SIMETH 200-200-20 MG/5ML PO SUSP
30.0000 mL | ORAL | Status: DC | PRN
  Administered 2019-08-10 – 2019-08-12 (×4): 30 mL via ORAL
  Filled 2019-08-10 (×4): qty 30

## 2019-08-10 NOTE — Progress Notes (Signed)
RT gave the Pt a PRN right after the schedule because he was still wheezing. RT will continue to moniotr

## 2019-08-10 NOTE — Care Management Important Message (Signed)
Important Message  Patient Details IM Letter given to Cookie McGibboney RN to present to the Patient Name: Tony Zhang MRN: [id number] Date of Birth: 08/16/1946   Medicare Important Message Given:  Yes     Kerin Salen 08/10/2019, 11:02 AM

## 2019-08-10 NOTE — Plan of Care (Signed)
  Problem: Education: Goal: Knowledge of General Education information will improve Description: Including pain rating scale, medication(s)/side effects and non-pharmacologic comfort measures Outcome: Completed/Met   Problem: Health Behavior/Discharge Planning: Goal: Ability to manage health-related needs will improve Outcome: Progressing   Problem: Clinical Measurements: Goal: Ability to maintain clinical measurements within normal limits will improve Outcome: Progressing Goal: Will remain free from infection Outcome: Progressing Goal: Diagnostic test results will improve Outcome: Progressing Goal: Respiratory complications will improve Outcome: Progressing Goal: Cardiovascular complication will be avoided Outcome: Progressing   Problem: Activity: Goal: Risk for activity intolerance will decrease Outcome: Progressing   Problem: Nutrition: Goal: Adequate nutrition will be maintained Outcome: Progressing   Problem: Coping: Goal: Level of anxiety will decrease Outcome: Progressing   Problem: Elimination: Goal: Will not experience complications related to bowel motility Outcome: Progressing Goal: Will not experience complications related to urinary retention Outcome: Completed/Met   Problem: Pain Managment: Goal: General experience of comfort will improve Outcome: Completed/Met   Problem: Safety: Goal: Ability to remain free from injury will improve Outcome: Progressing   Problem: Skin Integrity: Goal: Risk for impaired skin integrity will decrease Outcome: Progressing

## 2019-08-10 NOTE — Progress Notes (Addendum)
[place] for heparin/coumadin Indication: VTE treatment  Allergies  Allergen Reactions  . Codeine Hives and Other (See Comments)    Takes benadryl to stop reaction    Patient Measurements: Height: 6\' 1"  (185.4 cm) Weight: 199 lb 4.8 oz (90.4 kg) IBW/kg (Calculated) : 79.9  Vital Signs: Temp: 98.7 F (37.1 C) (10/23 0624) Temp Source: Oral (10/23 0624) BP: 150/85 (10/23 0624) Pulse Rate: 98 (10/23 0624)  Labs: Recent Labs    08/08/19 0156 08/08/19 0858 08/08/19 1206 08/08/19 2033 08/09/19 0236 08/09/19 0923 08/10/19 0358  HGB 10.4*  --   --   --  11.1*  --  12.6*  HCT 31.5*  --   --   --  34.0*  --  39.1  PLT 149*  --   --   --  154  --  182  LABPROT 21.4*  --   --   --  21.7*  --  26.7*  INR 1.9*  --   --   --  1.9*  --  2.5*  HEPARINUNFRC 0.20*  --  0.21* 0.21*  --  0.31 0.47  CREATININE 1.00  --   --   --  0.84  --  0.84  TROPONINIHS  --  59* 69*  --   --   --   --     Estimated Creatinine Clearance: 87.2 mL/min (by C-G formula based on SCr of 0.84 mg/dL).   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  Pharmacy consulted to dose coumadin.  PTA coumadin 4mg  po daily, last dose 10/18 at 1600.  08/10/2019 - Heparin drip d/c'd today, no longer needed to bridge w/ warfarin - No bleeding issues noted per RN - Hgb 12.6 and trending up, Plts 182 and trending up - INR 2.5 - therapeutic - Cardiac diet   Goal of Therapy:  INR 2-3   Plan:  Coumadin 4 mg po x 1 dose Daily INR Monitor for s/sx of bleeding  Natale Lay, PharmD Candidate 08/10/2019, 8:13 AM

## 2019-08-10 NOTE — Progress Notes (Signed)
Physical Therapy Treatment Patient Details Name: Tony Zhang MRN: [id number] DOB: 03-27-1946 Today's Date: 08/10/2019    History of Present Illness 73 yo male admitted to ED on 10/19 for PNA sespis. Pt with elevated troponin, now downtrending with cardiology documenting potential demand ischemia due to sepsis. PMH includes COPD, dyslipidemia, HTN, PVD, AAA, history of DVT and PE, bilateral renal artery stenosis, lumbar surgery.    PT Comments    Pt reports he is too weak to mobilize.  Attempted to explain benefits of mobility and that mobilizing will help improve strength however pt did not appear open to actively listening.   Pt eventually agreeable to up to recliner since he wishes to d/c home.  Pt refuses SNF, wants to go home, and did not want to use RW today.  Continue to recommend SNF upon d/c.   Follow Up Recommendations  SNF;Supervision for mobility/OOB     Equipment Recommendations  Rolling walker with 5" wheels(pt will likely refuse)    Recommendations for Other Services       Precautions / Restrictions Precautions Precautions: Fall    Mobility  Bed Mobility Overal bed mobility: Needs Assistance Bed Mobility: Supine to Sit     Supine to sit: Supervision;HOB elevated        Transfers Overall transfer level: Needs assistance Equipment used: None Transfers: Sit to/from [employer] Transfers Sit to Stand: Min assist;+2 safety/equipment Stand pivot transfers: +2 safety/equipment       General transfer comment: assist to rise and steady, pt adamantly refused RW for support, wide BOS for shuffling to recliner, Spo2 89-90% room air after transfer  Ambulation/Gait             General Gait Details: pt refused despite encouragement   Stairs             Wheelchair Mobility    Modified Rankin (Stroke Patients Only)       Balance                                            Cognition Arousal/Alertness:  Awake/alert Behavior During Therapy: WFL for tasks assessed/performed Overall Cognitive Status: Within Functional Limits for tasks assessed                                        Exercises      General Comments        Pertinent Vitals/Pain Pain Assessment: No/denies pain Pain Score: 6  Pain Location: right side Pain Descriptors / Indicators: Sore;Discomfort Pain Intervention(s): Repositioned;Monitored during session    Home Living                      Prior Function            PT Goals (current goals can now be found in the care plan section) Progress towards PT goals: Progressing toward goals    Frequency    Min 3X/week      PT Plan Current plan remains appropriate    Co-evaluation              AM-PAC PT "6 Clicks" Mobility   Outcome Measure  Help needed turning from your back to your side while in a flat bed without using bedrails?: None Help needed moving from  lying on your back to sitting on the side of a flat bed without using bedrails?: None Help needed moving to and from a bed to a chair (including a wheelchair)?: A Little Help needed standing up from a chair using your arms (e.g., wheelchair or bedside chair)?: A Little Help needed to walk in hospital room?: A Lot Help needed climbing 3-5 steps with a railing? : A Lot 6 Click Score: 18    End of Session   Activity Tolerance: Patient tolerated treatment well;Patient limited by fatigue Patient left: in chair;with chair alarm set;with call bell/phone within reach Nurse Communication: Mobility status PT Visit Diagnosis: Muscle weakness (generalized) (M62.81);Difficulty in walking, not elsewhere classified (R26.2)     Time: 0998-3382 PT Time Calculation (min) (ACUTE ONLY): 16 min  Charges:  $Therapeutic Activity: 8-22 mins                    [name], PT, DPT Acute Rehabilitation Services Office: [phone] Pager: [phone]  [name] 08/10/2019,  2:20 PM

## 2019-08-10 NOTE — Progress Notes (Signed)
  Speech Language Pathology Treatment: Dysphagia  Patient Details Name: Tony Zhang MRN: [id number] DOB: 12-Sep-1946 Today's Date: 08/10/2019 Time: 1208-1230 SLP Time Calculation (min) (ACUTE ONLY): 22 min  Assessment / Plan / Recommendation Clinical Impression  Pt seen at bedside for assessment of diet tolerance and education. Pt was resting in bed, no family present. He was positioned as upright as he could tolerate. Pt exhibited intermittent cough even in the absence of po trials, and used suction independently. Pt accepted trials of thin liquid, puree, and solid consistencies. He tolerated each without overt s/s aspiration. Given CXR results, pt was educated about completion of MBS to objectively assess swallow function and safety. Pt has dx COPD, which increases risk for silent aspiration, which cannot be detected at bedside. Pt declined participation in [place] at this time. SLP reiterated that MBS remains an option if lungs do not clear in a timely fashion, or if pt decides he would like to have this swallow study.  Recommend continuing with current diet (dys3/thin). Pt was encouraged to sit as upright as he can tolerate to minimize aspiration risk. SLP will follow briefly to insure diet tolerance and complete education.    HPI HPI: 73 yo male admitted to ED on 10/19 for PNA sespis. Pt with elevated troponin, now downtrending with cardiology documenting potential demand ischemia due to sepsis. PMH includes COPD, dyslipidemia, HTN, PVD, AAA, history of DVT and PE, bilateral renal artery stenosis, lumbar surgery.  Most recent CXR on 10/22 revealed "bilateral lung opacities, with slight improvement in the left lung and right upper lung zone, but slight worsening at the right lung base."  Pt denied hx of dysphagia.       SLP Plan  Continue with current plan of care       Recommendations  Diet recommendations: Dysphagia 3 (mechanical soft);Thin liquid Liquids provided via: Straw Medication  Administration: Whole meds with liquid Supervision: Patient able to self feed;Intermittent supervision to cue for compensatory strategies Compensations: Minimize environmental distractions;Slow rate;Small sips/bites Postural Changes and/or Swallow Maneuvers: Seated upright 90 degrees;Upright 30-60 min after meal                Oral Care Recommendations: Oral care BID Follow up Recommendations: Other (comment)(TBD) SLP Visit Diagnosis: Dysphagia, unspecified (R13.10) Plan: Continue with current plan of care       [place]. Quentin Ore, [facility], [place] Speech Language Pathologist Office: [phone] Pager: [phone]  Shonna Chock 08/10/2019, 12:37 PM

## 2019-08-10 NOTE — Progress Notes (Signed)
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  Patient reports increasing cough with shortness of breath congestion.  Complaint of left-sided chest pain worsened on deep breathing, subjective fevers and chills. In the ED he was in septic shock with hypoxic respiratory failure (O2 sats 77% on room air), fever 102.6 F, tachycardic, hypotensive, lactic acid of 2.8, WC of 15 .9K and AKI with creatinine 1.45.  Also had elevated troponin with no ischemic changes on EKG.  Chest x-ray showed patchy groundglass opacities in the left mid and lower lung lobe concerning for multifocal pneumonia.  Was placed on nonrebreather and started empiric antibiotic.  COVID-19 was tested negative. Admitted to stepdown unit for septic shock.  PCCM involved in care.    **Interim History A few nights ago he was hypotensive briefly requiring Levophed and was weaned off of it.  Blood pressures remained stable but he still complains of cough and left-sided chest pain on inspiration and felt it is a deep stabbing pain.  Still on supplemental oxygen via nasal cannula and is improving slightly and is suctioning his has helped. HR has been improving. O2 Weaning is happening and he is doing well on Abx. PT/OT recommending SNF but patient refusing and wanting to go home. Patient's Heparin gtt was now stopped.   Assessment & Plan:   Principal Problem:   Sepsis due to pneumonia [facility]) Active Problems:   DVT, lower extremity and pulmonary embolism, recurrent   Hyperlipidemia   Gout   COPD (chronic obstructive pulmonary disease) (HCC)   Multifocal  pneumonia   AKI (acute kidney injury) (HCC)   Pressure injury of skin   Acute respiratory failure with hypoxia (HCC)   Demand ischemia (HCC)  Severe Sepsis with septic shock (HCC) Acute respiratory failure with hypoxemia, improving  -Secondary to multifocal pneumonia.  Initially on NRB, transitioned to 6 L via nasal cannula and now on 2 Liters and Nurse weaned to Room Air and saturating in the 90's.  Also required pressors briefly during the night for hypotension.  Now stable. -Continued IV fluids but are now discontinued ,  -C/w empiric Cefepime. Discontinue IV Vancomycin.  Follow blood cultures and 1/2 Showed Staph Species and likely contaminant. -Will repeat Blood Cx x2 -C/w Supportive care with antitussives and Vicodin for pain.  Continue as needed nebs -Wean oxygen as tolerated, incentive spirometry. -Continue stepdown monitoring.  WBC and subsequent lactic acid improving -Continue to Monitor for Infection  -CXR this AM showed "Stable predominately bibasilar pulmonary opacities. Left pleural effusion" -Check SLP Evaluation and he was placed on a Dysphagia 3 Diet with Thin Liquids -Continue to Wean O2 -C/w Xopenex 0.63 mg Neb q2hprn Wheezing and SOB -C/w DuoNeb 3 mL 4 times Daily and Guaifenesin 1200 mg po BID -C/w Flutter Valve and Incentive Spirometry   Left-sided pleuritic chest pain, improved.  -Likely pleurisy from multifocal pneumonia.   -Bedside ultrasound was done showing small amount of pleural fluid without effusion or loculation. -Pain control with Vicodin and given a 1x dose of Morphine.  Empiric antibiotics, DuoNeb and Vicodin as needed for pain. -Will obtain CT of the chest if respiratory symptoms unimproved and has persistent pain  but it is improved today  DVT, lower extremity and pulmonary embolism, recurrent -INR was subtherapeutic.  Markedly elevated D-dimer.  Given presentation of septic shock with multifocal pneumonia respiratory symptoms are likely due to  infection rather than PE. -C/w Coumadin with Pharmacy to Dose and continued with IV heparin bridge until today. INR was 2.5 -Doppler lower extremity negative for DVT.  Elevated troponin/history of CAD -No EKG changes.  Troponin trending down and last Two Troponin was 59 and 69.  Cardiology consult appreciated and feel it is pleuritic.  Suspect demand ischemia. -Recent 2D echo 1 month back showing normal EF and impaired LV relaxation -Cardiology did not feel there is any acute cardiac concerns at this time and have signed off the case  Hyperlipidemia/PAD -Lipid panel was checked and showed a total cholesterol/HDL ratio of 2.6, cholesterol level of 71, HDL 27, LDL 20, triglycerides 119, VLDL 24 -Continue statin and cilostazol  COPD (chronic obstructive pulmonary disease) (HCC) -Continue scheduled nebs.  Not in acute exacerbation -Continue Supplemental O2 via [place]  AKI (acute kidney injury) (HCC) Metabolic Acidosis -ATN secondary to septic shock.  Improving a.m. labs with fluids and pressors -Patient's CO2 is 19 and AG is 12 and Chloride is 103 -Continue to Monitor and Trend Renal Fxn -Patient's BUN/Cr is now 13/0.84 -Continue to Monitor Renal Fxn and Repeat CMP in the AM  Leukocytosis -Improving. WBC went from 13.5 -> 9.3 -> 6.6 -> 7.1 -C/w Abx as delineated as above -Continue to Monitor and trend -Procalcitonin is trending down  Normocytic Anemia -Patient's hemoglobin/hematocrit went from 12.1/37.5 is now 11.1/34.0 -Checked Anemia Panel and showed iron level of 31, U IBC 140, TIBC 171, saturation ratios of 18%, ferritin level 567, folate of 15.0, and vitamin B12 level of 53 -Continue monitor for signs and symptoms of bleeding; currently no overt bleeding noted-repeat CBC in a.m.  Thrombocytopenia The patient's platelet count went from 180,000 and is now 154,000 -Continue monitor as patient is on a heparin drip and anticoagulated warfarin Continue monitor for signs and  symptoms of bleeding -If continues to drop further will need to consider HIT panel if clinically do not feel he has HIT -To monitor and trend and repeat CBC  Hypomagnesemia -Patient magnesium level was extremely low at 1.6 -Continue monitor and replete with with IV mag sulfate 2 g -Repeat magnesium level in the a.m.  Hypocalcemia -Patient's calcium level 8.2 and adjusted for albumin is a little bit higher -Continue to monitor and trend and repeat CMP in the a.m.  HyperBilirubinemia -Mild at 1.4 and now improved to 1.0 -Continue to Monitor and trend repeat CMP in a.m.  DVT prophylaxis: Heparin gtt bridge with Coumadin  Code Status: FULL CODE Family Communication: No family present at bedside  Disposition Plan: Transferred to Medical Floor as he was stable and Needs SNF at D/C but Refusing; Anticipating D/C home in the next 24-48 hours if improving   Consultants:   Cardiology  PCCM/Pulmonary    Procedures: None   Antimicrobials:  Anti-infectives (From admission, onward)   Start     Dose/Rate Route Frequency Ordered Stop   08/08/19 1400  ceFEPIme (MAXIPIME) 2 g in sodium chloride 0.9 % 100 mL IVPB     2 g 200 mL/hr over 30 Minutes Intravenous Every 8 hours 08/08/19 0753     08/08/19 1200  vancomycin (VANCOCIN) IVPB 1000 mg/200 mL premix  Status:  Discontinued     1,000 mg 200 mL/hr over 60 Minutes Intravenous Every 12 hours 08/08/19  1053 08/09/19 0934   08/07/19 1600  vancomycin (VANCOCIN) 1,500 mg in sodium chloride 0.9 % 500 mL IVPB  Status:  Discontinued     1,500 mg 250 mL/hr over 120 Minutes Intravenous Every 24 hours 08/06/19 1054 08/08/19 1053   08/06/19 1800  ceFEPIme (MAXIPIME) 2 g in sodium chloride 0.9 % 100 mL IVPB  Status:  Discontinued     2 g 200 mL/hr over 30 Minutes Intravenous Every 12 hours 08/06/19 0938 08/08/19 0753   08/06/19 1000  ceFEPIme (MAXIPIME) 2 g in sodium chloride 0.9 % 100 mL IVPB  Status:  Discontinued     2 g 200 mL/hr over 30 Minutes  Intravenous Every 12 hours 08/06/19 0837 08/06/19 0938   08/06/19 0900  vancomycin (VANCOCIN) 1,500 mg in sodium chloride 0.9 % 500 mL IVPB     1,500 mg 250 mL/hr over 120 Minutes Intravenous  Once 08/06/19 0832 08/06/19 1137   08/06/19 0800  cefTRIAXone (ROCEPHIN) 2 g in sodium chloride 0.9 % 100 mL IVPB  Status:  Discontinued     2 g 200 mL/hr over 30 Minutes Intravenous Every 24 hours 08/06/19 0623 08/06/19 0816   08/06/19 0630  azithromycin (ZITHROMAX) 500 mg in sodium chloride 0.9 % 250 mL IVPB  Status:  Discontinued     500 mg 250 mL/hr over 60 Minutes Intravenous Daily 08/06/19 0623 08/06/19 0816   08/06/19 0530  vancomycin (VANCOCIN) 2,000 mg in sodium chloride 0.9 % 500 mL IVPB     2,000 mg 250 mL/hr over 120 Minutes Intravenous  Once 08/06/19 0448 08/06/19 0735   08/06/19 0500  ceFEPIme (MAXIPIME) 2 g in sodium chloride 0.9 % 100 mL IVPB     2 g 200 mL/hr over 30 Minutes Intravenous NOW 08/06/19 0447 08/06/19 0536     Subjective: Seen and examined at bedside and states that his chest pain is improved significantly but he states that he still feels a little bit bad with the shortness of breath and states that he still has intermittent pain when he coughs.  States he is try to cough up any sputum and that the [place] helps.  No nausea or vomiting.  Patient is very weak and deconditioned however does not want to go to a skilled nursing facility.  O2 is weaning and he is on room air with saturations in the low 90s.  No other concerns or complaints at this time.  Objective: Vitals:   08/10/19 1315 08/10/19 1359 08/10/19 1456 08/10/19 1517  BP: (!) 164/77   (!) 168/62  Pulse: 100 (!) 106 (!) 106 98  Resp: 18     Temp: 97.9 F (36.6 C)     TempSrc: Oral     SpO2: 94% 90% 91%   Weight:      Height:        Intake/Output Summary (Last 24 hours) at 08/10/2019 1625 Last data filed at 08/10/2019 1400 Gross per 24 hour  Intake 2370.24 ml  Output 3300 ml  Net -929.76 ml   Filed  Weights   08/08/19 0338 08/09/19 0500 08/10/19 0622  Weight: 97 kg 97.5 kg 90.4 kg   Examination: Physical Exam:  Constitutional: WN/WD overweight Caucasian male in NAD and appears calm Eyes: Lids and conjunctivae normal, sclerae anicteric  ENMT: External Ears, Nose appear normal. Grossly normal hearing. Mucous membranes are moist.  Neck: Appears normal, supple, no cervical masses, normal ROM, no appreciable thyromegaly; no JVD Respiratory: Diminished to auscultation bilaterally, no wheezing, rales, rhonchi or crackles.  Normal respiratory effort and patient is not tachypenic. No accessory muscle use. Unlabored breathing but was wearing 2 Liters of Supplemental O2 via [place]. Cardiovascular: Tachycardic rate but regular rhythm, no murmurs / rubs / gallops. S1 and S2 auscultated. Trace extremity edema. Abdomen: Soft, non-tender, Distended. Bowel sounds positive x4.  GU: Deferred. Musculoskeletal: No clubbing / cyanosis of digits/nails. No joint deformity upper and lower extremities. Skin: No rashes, lesions, ulcers on a limited skin evaluation. No induration; Warm and dry.  Neurologic: CN 2-12 grossly intact with no focal deficits. Romberg sign and cerebellar reflexes not assessed.  Psychiatric: Normal judgment and insight. Alert and oriented x 3. Anxious mood and appropriate affect.   Data Reviewed: I have personally reviewed following labs and imaging studies  CBC: Recent Labs  Lab 08/06/19 0430 08/07/19 0522 08/08/19 0156 08/09/19 0236 08/10/19 0358  WBC 15.9* 13.5* 9.3 6.6 7.1  NEUTROABS 14.3*  --   --  5.5 5.7  HGB 16.9 12.1* 10.4* 11.1* 12.6*  HCT 50.6 37.5* 31.5* 34.0* 39.1  MCV 93.2 97.2 94.3 95.2 94.9  PLT 307 180 149* 154 182   Basic Metabolic Panel: Recent Labs  Lab 08/06/19 0430 08/07/19 0522 08/08/19 0156 08/09/19 0236 08/10/19 0358  NA 136 136 134* 135 134*  K 4.0 3.8 3.5 3.9 3.9  CL 103 108 106 105 103  CO2 21* 18* 17* 19* 19*  GLUCOSE 105* 94 112* 108* 97    BUN 20 26* 25* 17 13  CREATININE 1.45* 1.24 1.00 0.84 0.84  CALCIUM 9.4 7.6* 7.7* 8.2* 9.0  MG  --   --  1.2* 1.5* 1.6*  PHOS  --   --  2.7 3.0 3.0   GFR: Estimated Creatinine Clearance: 87.2 mL/min (by C-G formula based on SCr of 0.84 mg/dL). Liver Function Tests: Recent Labs  Lab 08/06/19 0430 08/08/19 0156 08/09/19 0236 08/10/19 0358  AST 21 13* 11* 14*  ALT ALKPHOS 71 57 57 68  BILITOT 1.1 1.0 1.4* 1.0  PROT 7.4 5.4* 5.8* 6.4*  ALBUMIN 4.0 2.6* 2.6* 2.8*   No results for input(s): LIPASE, AMYLASE in the last 168 hours. No results for input(s): AMMONIA in the last 168 hours. Coagulation Profile: Recent Labs  Lab 08/06/19 0430 08/08/19 0156 08/09/19 0236 08/10/19 0358  INR 1.7* 1.9* 1.9* 2.5*   Cardiac Enzymes: No results for input(s): CKTOTAL, CKMB, CKMBINDEX, TROPONINI in the last 168 hours. BNP (last 3 results) No results for input(s): PROBNP in the last 8760 hours. HbA1C: No results for input(s): HGBA1C in the last 72 hours. CBG: No results for input(s): GLUCAP in the last 168 hours. Lipid Profile: No results for input(s): CHOL, HDL, LDLCALC, TRIG, CHOLHDL, LDLDIRECT in the last 72 hours. Thyroid Function Tests: No results for input(s): TSH, T4TOTAL, FREET4, T3FREE, THYROIDAB in the last 72 hours. Anemia Panel: Recent Labs    08/10/19 0358  VITAMINB12 53*  FOLATE 15.0  FERRITIN 567*  TIBC 171*  IRON 31*  RETICCTPCT 2.0   Sepsis Labs: Recent Labs  Lab 08/06/19 0430 08/06/19 0839 08/06/19 1106 08/07/19 0522 08/08/19 0156  PROCALCITON  --  15.48  --  9.91 5.20  LATICACIDVEN 2.8* 1.8 2.1*  --   --     Recent Results (from the past 240 hour(s))  SARS Coronavirus 2 by RT PCR (hospital order, performed in [facility] hospital lab) Nasopharyngeal Nasopharyngeal Swab     Status: None   Collection Time: 08/06/19  4:09 AM   Specimen:  Nasopharyngeal Swab  Result Value Ref Range Status   SARS Coronavirus 2 NEGATIVE NEGATIVE Final     Comment: (NOTE) If result is NEGATIVE SARS-CoV-2 target nucleic acids are NOT DETECTED. The SARS-CoV-2 RNA is generally detectable in upper and lower  respiratory specimens during the acute phase of infection. The lowest  concentration of SARS-CoV-2 viral copies this assay can detect is 250  copies / mL. A negative result does not preclude SARS-CoV-2 infection  and should not be used as the sole basis for treatment or other  patient management decisions.  A negative result may occur with  improper specimen collection / handling, submission of specimen other  than nasopharyngeal swab, presence of viral mutation(s) within the  areas targeted by this assay, and inadequate number of viral copies  (<250 copies / mL). A negative result must be combined with clinical  observations, patient history, and epidemiological information. If result is POSITIVE SARS-CoV-2 target nucleic acids are DETECTED. The SARS-CoV-2 RNA is generally detectable in upper and lower  respiratory specimens dur ing the acute phase of infection.  Positive  results are indicative of active infection with SARS-CoV-2.  Clinical  correlation with patient history and other diagnostic information is  necessary to determine patient infection status.  Positive results do  not rule out bacterial infection or co-infection with other viruses. If result is PRESUMPTIVE POSTIVE SARS-CoV-2 nucleic acids MAY BE PRESENT.   A presumptive positive result was obtained on the submitted specimen  and confirmed on repeat testing.  While 2019 novel coronavirus  (SARS-CoV-2) nucleic acids may be present in the submitted sample  additional confirmatory testing may be necessary for epidemiological  and / or clinical management purposes  to differentiate between  SARS-CoV-2 and other Sarbecovirus currently known to infect humans.  If clinically indicated additional testing with an alternate test  methodology [phone]) is advised. The SARS-CoV-2  RNA is generally  detectable in upper and lower respiratory sp ecimens during the acute  phase of infection. The expected result is Negative. Fact Sheet for Patients:  BoilerBrush.com.cy Fact Sheet for Healthcare Providers: https://pope.com/ This test is not yet approved or cleared by the [place] FDA and has been authorized for detection and/or diagnosis of SARS-CoV-2 by FDA under an Emergency Use Authorization (EUA).  This EUA will remain in effect (meaning this test can be used) for the duration of the COVID-19 declaration under Section 564(b)(1) of the Act, 21 U.S.C. section 360bbb-3(b)(1), unless the authorization is terminated or revoked sooner. Performed at [facility], [address] [place], [place] [address]   Blood culture (routine x 2)     Status: None (Preliminary result)   Collection Time: 08/06/19  4:30 AM   Specimen: BLOOD RIGHT HAND  Result Value Ref Range Status   Specimen Description BLOOD RIGHT HAND  Final   Special Requests   Final    BOTTLES DRAWN AEROBIC AND ANAEROBIC Blood Culture adequate volume   Culture   Final    NO GROWTH 4 DAYS Performed at [facility], [address], [place], [place] [address]    Report Status PENDING  Incomplete  Blood culture (routine x 2)     Status: Abnormal   Collection Time: 08/06/19  4:30 AM   Specimen: BLOOD  Result Value Ref Range Status   Specimen Description BLOOD RIGHT ANTECUBITAL  Final   Special Requests   Final    BOTTLES DRAWN AEROBIC AND ANAEROBIC Blood Culture adequate volume   Culture  Setup Time   Final    GRAM POSITIVE COCCI CRITICAL RESULT CALLED TO, READ BACK BY AND VERIFIED WITH: J GRIMSLEY PHARMD 08/07/19 0141 JDW IN BOTH AEROBIC AND ANAEROBIC BOTTLES    Culture (A)  Final    STAPHYLOCOCCUS SPECIES (COAGULASE NEGATIVE) THE SIGNIFICANCE OF ISOLATING THIS ORGANISM FROM A SINGLE SET OF BLOOD CULTURES WHEN MULTIPLE SETS ARE  DRAWN IS UNCERTAIN. PLEASE NOTIFY THE MICROBIOLOGY DEPARTMENT WITHIN ONE WEEK IF SPECIATION AND SENSITIVITIES ARE REQUIRED. Performed at [facility], [address], [place], [place] [address]    Report Status 08/09/2019 FINAL  Final  Culture, sputum-assessment     Status: None   Collection Time: 08/06/19  6:23 AM   Specimen: Sputum  Result Value Ref Range Status   Specimen Description SPUTUM  Final   Special Requests NONE  Final   Sputum evaluation   Final    THIS SPECIMEN IS ACCEPTABLE FOR SPUTUM CULTURE Performed at [facility], [address], [place], [place] [address]    Report Status 08/07/2019 FINAL  Final  Culture, respiratory     Status: None   Collection Time: 08/06/19  6:23 AM   Specimen: SPU  Result Value Ref Range Status   Specimen Description   Final    SPUTUM Performed at [facility], [address] [place], [place] [address]    Special Requests   Final    NONE Reflexed from [id number] Performed at [facility], [address], [place], [place] [address]    Gram Stain   Final    ABUNDANT WBC PRESENT,BOTH PMN AND MONONUCLEAR FEW GRAM POSITIVE COCCI FEW GRAM POSITIVE RODS Performed at [facility], [address], [place], [place] [address]    Culture FEW ESCHERICHIA COLI  Final   Report Status 08/10/2019 FINAL  Final   Organism ID, Bacteria ESCHERICHIA COLI  Final      Susceptibility   Escherichia coli - MIC*    AMPICILLIN 8 SENSITIVE Sensitive     CEFAZOLIN <=4 SENSITIVE Sensitive     CEFEPIME <=1 SENSITIVE Sensitive     CEFTAZIDIME <=1 SENSITIVE Sensitive     CEFTRIAXONE <=1 SENSITIVE Sensitive     CIPROFLOXACIN >=4 RESISTANT Resistant     GENTAMICIN <=1 SENSITIVE Sensitive     IMIPENEM <=0.25 SENSITIVE Sensitive     TRIMETH/SULFA <=20 SENSITIVE Sensitive     AMPICILLIN/SULBACTAM 4 SENSITIVE Sensitive     PIP/TAZO <=4 SENSITIVE Sensitive     Extended ESBL NEGATIVE Sensitive     *  FEW ESCHERICHIA COLI  Culture, Urine     Status: Abnormal   Collection Time: 08/06/19  8:36 AM   Specimen: Urine, Clean Catch  Result Value Ref Range Status   Specimen Description   Final    URINE, CLEAN CATCH Performed at [facility], [address] [place], [place] [address]    Special Requests   Final    NONE Performed at [facility], [address] [place], [place] [address]    Culture (A)  Final    <10,000 COLONIES/mL INSIGNIFICANT GROWTH Performed at [facility], [address] [place], [place] [address]    Report Status 08/07/2019 FINAL  Final  MRSA PCR Screening     Status: None   Collection Time: 08/07/19  9:30 PM   Specimen: Nasal Mucosa; Nasopharyngeal  Result Value Ref Range Status   MRSA by PCR NEGATIVE NEGATIVE Final  Comment:        The GeneXpert MRSA Assay (FDA approved for NASAL specimens only), is one component of a comprehensive MRSA colonization surveillance program. It is not intended to diagnose MRSA infection nor to guide or monitor treatment for MRSA infections. Performed at [facility], [address] [place], [place] [id number]      RN Pressure Injury Documentation: Pressure Injury 08/06/19 Sacrum Mid Stage I -  Intact skin with non-blanchable redness of a localized area usually over a bony prominence. redness on sacrum (Active)  08/06/19 0844  Location: Sacrum  Location Orientation: Mid  Staging: Stage I -  Intact skin with non-blanchable redness of a localized area usually over a bony prominence.  Wound Description (Comments): redness on sacrum  Present on Admission: Yes   Radiology Studies: Dg Chest Port 1 View  Result Date: 08/10/2019 CLINICAL DATA:  Shortness of breath and cough EXAM: PORTABLE CHEST 1 VIEW COMPARISON:  08/09/2019 FINDINGS: Cardiac shadow is stable. Lungs are well aerated bilaterally. Patchy parenchymal opacities are again seen slightly greater on  the left than the right and predominately within the bases. The overall appearance is stable from the prior study. Small left pleural effusion is noted. No acute bony abnormality is seen. IMPRESSION: Stable predominately bibasilar pulmonary opacities. Left pleural effusion. Electronically Signed   By: Alcide CleverMark  Lukens M.D.   On: 08/10/2019 07:22   Dg Chest Port 1 View  Result Date: 08/09/2019 CLINICAL DATA:  Shortness of breath. EXAM: PORTABLE CHEST 1 VIEW COMPARISON:  08/06/2019. FINDINGS: Slight improvement in left lung and right upper lung zone opacities from prior exam, but slight worsening at the right lung base. Emphysema. Unchanged heart size and mediastinal contours. No large pleural effusion, however lung bases not included in the field of view. Linear opacity in the right mid lung may be fluid in the fissure or atelectasis. No pneumothorax. IMPRESSION: Makes progression of bilateral lung opacities, with slight improvement in the left lung and right upper lung zone, but slight worsening at the right lung base. Electronically Signed   By: Narda RutherfordMelanie  Sanford M.D.   On: 08/09/2019 05:51    Scheduled Meds:  allopurinol  300 mg Oral Daily   cilostazol  100 mg Oral BID   diazepam  2 mg Oral QPM   guaiFENesin  600 mg Oral BID   HYDROcodone-acetaminophen  1 tablet Oral Q4H   ipratropium-albuterol  3 mL Nebulization QID   mouth rinse  15 mL Mouth Rinse BID   nebivolol  10 mg Oral Daily   pantoprazole  40 mg Oral BID   simvastatin  40 mg Oral q1800   warfarin  4 mg Oral ONCE-1800   Warfarin - Pharmacist Dosing Inpatient   Does not apply q1800   Continuous Infusions:  ceFEPime (MAXIPIME) IV 2 g (08/10/19 1305)    LOS: 4 days   Merlene Laughtermair Latif Lizet Kelso, DO [facility] PAGER is on AMION  If 7PM-7AM, please contact night-coverage [email or web address] Password [email or web address] 08/10/2019, 4:25 PM

## 2019-08-11 ENCOUNTER — Inpatient Hospital Stay (HOSPITAL_COMMUNITY): Payer: Medicare Other

## 2019-08-11 LAB — COMPREHENSIVE METABOLIC PANEL
ALT: 11 U/L (ref 0–44)
AST: 14 U/L — ABNORMAL LOW (ref 15–41)
Albumin: 3 g/dL — ABNORMAL LOW (ref 3.5–5.0)
Alkaline Phosphatase: 69 U/L (ref 38–126)
Anion gap: 12 (ref 5–15)
BUN: 13 mg/dL (ref 8–23)
CO2: 20 mmol/L — ABNORMAL LOW (ref 22–32)
Calcium: 9.2 mg/dL (ref 8.9–10.3)
Chloride: 102 mmol/L (ref 98–111)
Creatinine, Ser: 0.69 mg/dL (ref 0.61–1.24)
GFR calc Af Amer: >60 mL/min (ref >60)
GFR calc non Af Amer: >60 mL/min (ref >60)
Glucose, Bld: 99 mg/dL (ref 70–99)
Potassium: 3.6 mmol/L (ref 3.5–5.1)
Sodium: 134 mmol/L — ABNORMAL LOW (ref 135–145)
Total Bilirubin: 1.3 mg/dL — ABNORMAL HIGH (ref 0.3–1.2)
Total Protein: 6.7 g/dL (ref 6.5–8.1)

## 2019-08-11 LAB — CBC WITH DIFFERENTIAL/PLATELET
Abs Immature Granulocytes: 0.47 10*3/uL — ABNORMAL HIGH (ref 0.00–0.07)
Basophils Absolute: 0.1 10*3/uL (ref 0.0–0.1)
Basophils Relative: 1 %
Eosinophils Absolute: 0.4 10*3/uL (ref 0.0–0.5)
Eosinophils Relative: 6 %
HCT: 39.4 % (ref 39.0–52.0)
Hemoglobin: 13 g/dL (ref 13.0–17.0)
Immature Granulocytes: 6 %
Lymphocytes Relative: 6 %
Lymphs Abs: 0.5 10*3/uL — ABNORMAL LOW (ref 0.7–4.0)
MCH: 30.5 pg (ref 26.0–34.0)
MCHC: 33 g/dL (ref 30.0–36.0)
MCV: 92.5 fL (ref 80.0–100.0)
Monocytes Absolute: 0.7 10*3/uL (ref 0.1–1.0)
Monocytes Relative: 10 %
Neutro Abs: 5.5 10*3/uL (ref 1.7–7.7)
Neutrophils Relative %: 71 %
Platelets: 186 10*3/uL (ref 150–400)
RBC: 4.26 MIL/uL (ref 4.22–5.81)
RDW: 15.2 % (ref 11.5–15.5)
WBC: 7.7 10*3/uL (ref 4.0–10.5)
nRBC: 0 % (ref 0.0–0.2)

## 2019-08-11 LAB — MAGNESIUM: Magnesium: 1.6 mg/dL — ABNORMAL LOW (ref 1.7–2.4)

## 2019-08-11 LAB — CULTURE, BLOOD (ROUTINE X 2)
Culture: NO GROWTH
Special Requests: ADEQUATE

## 2019-08-11 LAB — PROTIME-INR
INR: 3 — ABNORMAL HIGH (ref 0.8–1.2)
Prothrombin Time: 30.6 seconds — ABNORMAL HIGH (ref 11.4–15.2)

## 2019-08-11 LAB — PHOSPHORUS: Phosphorus: 3.2 mg/dL (ref 2.5–4.6)

## 2019-08-11 MED ORDER — FAMOTIDINE 20 MG PO TABS
20.0000 mg | ORAL_TABLET | Freq: Two times a day (BID) | ORAL | Status: DC
  Administered 2019-08-11 – 2019-08-14 (×7): 20 mg via ORAL
  Filled 2019-08-11 (×7): qty 1

## 2019-08-11 MED ORDER — WARFARIN SODIUM 1 MG PO TABS
1.0000 mg | ORAL_TABLET | Freq: Once | ORAL | Status: AC
  Administered 2019-08-11: 1 mg via ORAL
  Filled 2019-08-11: qty 1

## 2019-08-11 MED ORDER — MAGNESIUM SULFATE 2 GM/50ML IV SOLN
2.0000 g | Freq: Once | INTRAVENOUS | Status: AC
  Administered 2019-08-11: 2 g via INTRAVENOUS
  Filled 2019-08-11: qty 50

## 2019-08-11 MED ORDER — AMOXICILLIN-POT CLAVULANATE 875-125 MG PO TABS: 1.0000 | ORAL_TABLET | Freq: Two times a day (BID) | ORAL | Status: DC

## 2019-08-11 MED ORDER — CEFDINIR 300 MG PO CAPS
300.0000 mg | ORAL_CAPSULE | Freq: Two times a day (BID) | ORAL | Status: DC
  Administered 2019-08-12 – 2019-08-14 (×6): 300 mg via ORAL
  Filled 2019-08-11 (×7): qty 1

## 2019-08-11 NOTE — TOC Progression Note (Signed)
Transition of Care [facility]) - Progression Note    Patient Details  Name: Tony Zhang MRN: [id number] Date of Birth: Sep 02, 1946  Transition of Care [facility]) CM/SW [place], LCSW Phone Number: 08/11/2019, 3:15 PM  Clinical Narrative:   CSW following patient for placement needs. CSW spoke to patient at bedside to discuss discharge. Patient stated he does not want to go to a rehab facility and would prefer to go home. Patient stated he lives alone and he doesn't have family to assist him in the home. Patient stated his goal is to be able to walk again in the hospital then go back home. Patient is addiment about discharging home     Expected Discharge Plan: Home/Self Care Barriers to Discharge: Continued Medical Work up  Expected Discharge Plan and Services Expected Discharge Plan: Home/Self Care In-house Referral: Clinical Social Work     Living arrangements for the past 2 months: Single Family Home                                       Social Determinants of Health (SDOH) Interventions    Readmission Risk Interventions No flowsheet data found.

## 2019-08-11 NOTE — Progress Notes (Signed)
Pharmacy Antibiotic Note  Tony Zhang is a 73 y.o. male admitted on 08/06/2019 with sepsis.  Pharmacy has been consulted for cefepime dosing. 08/11/2019 ABX D#6 cefepime for sepsis 2nd multifocal PNA WBC WNL, AF, SCr WNL 10/19 Sputum: few E coli. sens to all X cipro 10/19 BCx: CNS, probable contaminant. Repeat BCx 10/23 NG< 12 hrs  Plan: Cefepime 2 gm IV q8h dose remains appropriate  Consider transition to Cefdinir 300 mg PO BID  Height: 6\' 1"  (185.4 cm) Weight: 196 lb 3.2 oz (89 kg) IBW/kg (Calculated) : 79.9  Temp (24hrs), Avg:98.2 F (36.8 C), Min:97.8 F (36.6 C), Max:98.9 F (37.2 C)  Recent Labs  Lab 08/06/19 0430 08/06/19 0839 08/06/19 1106 08/07/19 0522 08/08/19 0156 08/09/19 0236 08/10/19 0358 08/11/19 0454  WBC 15.9*  --   --  13.5* 9.3 6.6 7.1 7.7  CREATININE 1.45*  --   --  1.24 1.00 0.84 0.84 0.69  LATICACIDVEN 2.8* 1.8 2.1*  --   --   --   --   --     Estimated Creatinine Clearance: 91.6 mL/min (by C-G formula based on SCr of 0.69 mg/dL).    Allergies  Allergen Reactions  . Codeine Hives and Other (See Comments)    Takes benadryl to stop reaction   Antimicrobials this admission: 10/19 Vanc >>10/22 10/19 cefepime >>  10/19 Azith x1 Dose adjustments this admission:  Microbiology results: 10/19 BCx: 2/4 gpc CNS --BCID not run. Both bottles of 1 set. -LN 10/20 10/19 UCx:  insig growth F 10/20 MRSA PCR: neg 10/19 legionella neg 10/19 strep pneumo neg 10/19 sputum:  few E coli: sens to all X R to cipro 10/23 BCx2: ngtd  Thank you for allowing pharmacy to be a part of this patient's care.  Eudelia Bunch, Pharm.D [phone] 08/11/2019 11:58 AM

## 2019-08-11 NOTE — Progress Notes (Signed)
Pt found on room air, spo2 86%.  Pt placed back on 2lnc, spo2 now 92%.  RN notified.

## 2019-08-11 NOTE — Progress Notes (Signed)
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  Patient reports increasing cough with shortness of breath congestion.  Complaint of left-sided chest pain worsened on deep breathing, subjective fevers and chills. In the ED he was in septic shock with hypoxic respiratory failure (O2 sats 77% on room air), fever 102.6 F, tachycardic, hypotensive, lactic acid of 2.8, WC of 15 .9K and AKI with creatinine 1.45.  Also had elevated troponin with no ischemic changes on EKG.  Chest x-ray showed patchy groundglass opacities in the left mid and lower lung lobe concerning for multifocal pneumonia.  Was placed on nonrebreather and started empiric antibiotic.  COVID-19 was tested negative. Admitted to stepdown unit for septic shock.  PCCM involved in care.    **Interim History A few nights ago he was hypotensive briefly requiring Levophed and was weaned off of it.  Blood pressures remained stable but he still complains of cough and left-sided chest pain on inspiration and felt it is a deep stabbing pain.  N longer on supplemental oxygen via nasal cannula and is improving slightly and is suctioning his has helped. HR has been improving. He is doing well on Abx and will transition to po. PT/OT recommending SNF but patient refusing and wanting to go home. Patient's Heparin gtt was now stopped.  Hospitalization has been complicated by GERD and reflux so we have added clonidine.  Patient is extremely adamant about going home and does not want to go to a skilled nursing facility at all.  Antibiotics have been changed to p.o. today and  respiratory status is stable and will adjust the patient to GERD and reflux.  Anticipating discharge in the next 24 to 48 hours if stable  Assessment & Plan:   Principal Problem:   Sepsis due to pneumonia [facility]) Active Problems:   DVT, lower extremity and pulmonary embolism, recurrent   Hyperlipidemia   Gout   COPD (chronic obstructive pulmonary disease) (HCC)   Multifocal pneumonia   AKI (acute kidney injury) (HCC)   Pressure injury of skin   Acute respiratory failure with hypoxia (HCC)   Demand ischemia (HCC)  Severe Sepsis with septic shock (HCC), improved  Acute respiratory failure with hypoxemia, improving  -Secondary to multifocal pneumonia.  Initially on NRB, transitioned to 6 L via nasal cannula and now on 2 Liters and Nurse weaned to Room Air and saturating in the 90's.  Also required pressors briefly during the night for hypotension.  Now stable. -Continued IV fluids but are now discontinued  -Continued Empiric Cefepime and changed to po Cefdinir. Discontinue IV Vancomycin.  Follow blood cultures and 1/2 Showed Staph Species and likely contaminant. -Repeated Blood Cx x2 and showed NGTD <12 hours  -C/w Supportive care with antitussives and Vicodin for pain.  Continue as needed nebs -Wean oxygen as tolerated, incentive spirometry. -Continue stepdown monitoring.  WBC and subsequent lactic acid improving -Continue to Monitor for Infection  -CXR this AM showed "No change in aeration to the lungs compared with prior exam. Stable patchy opacity within the periphery of the right upper lobe. Stable small pleural effusions" -Check SLP Evaluation and he was placed on a Dysphagia 3 Diet with  Thin Liquids -Continue to Wean O2 and now off of it -C/w Xopenex 0.63 mg Neb q2hprn Wheezing and SOB -C/w DuoNeb 3 mL 4 times Daily and Guaifenesin 1200 mg po BID -C/w Flutter Valve and Incentive Spirometry  -Patient is improving and recommending up in Chair -PT/OT recommending SNF but patient  refusing and does not have very much family support at hom  Left-sided pleuritic chest pain, improved.  -Likely pleurisy from multifocal pneumonia.   -Bedside ultrasound was done showing small amount of pleural fluid without effusion or loculation. -Pain control with Vicodin and given a 1x dose of Morphine.  Empiric antibiotics, DuoNeb and Vicodin as needed for pain. -Will obtain CT of the chest if respiratory symptoms unimproved and has persistent pain but it is improved today so will not obtain   DVT, lower extremity and pulmonary embolism, recurrent -INR was subtherapeutic.  Markedly elevated D-dimer.  Given presentation of septic shock with multifocal pneumonia respiratory symptoms are likely due to infection rather than PE. -C/w Coumadin with Pharmacy to Dose and continued with IV heparin bridge until today. INR was 3.0 -Doppler lower extremity negative for DVT.  Elevated troponin/history of CAD -No EKG changes.  Troponin trending down and last Two Troponin was 59 and 69.  Cardiology consult appreciated and feel it is pleuritic.  Suspect demand ischemia. -Recent 2D echo 1 month back showing normal EF and impaired LV relaxation -Cardiology did not feel there is any acute cardiac concerns at this time and have signed off the case  Hyperlipidemia/PAD -Lipid panel was checked and showed a total cholesterol/HDL ratio of 2.6, cholesterol level of 71, HDL 27, LDL 20, triglycerides 119, VLDL 24 -Continue statin and cilostazol  COPD (chronic obstructive pulmonary disease) (HCC) -Continue scheduled nebs.  Not in acute exacerbation -Continue Supplemental O2 via [place] and this has been weaned   AKI (acute kidney injury) (HCC) Metabolic Acidosis -ATN secondary to septic shock.  Improving a.m. labs with fluids and pressors -Patient's CO2 is now 20 and AG is 12 and Chloride is 102 -Continue to Monitor and Trend Renal Fxn -Patient's BUN/Cr is now 13/0.69 -Continue to Monitor Renal Fxn and  Repeat CMP in the AM  Leukocytosis -Improving. WBC went from 13.5 -> 9.3 -> 6.6 -> 7.1 -> 7.7 -C/w Abx as delineated as above -Continue to Monitor and trend -Procalcitonin is trending down  Normocytic Anemia -Patient's hemoglobin/hematocrit went from 12.1/37.5 is now 13.0/39.4 -Checked Anemia Panel and showed iron level of 31, U IBC 140, TIBC 171, saturation ratios of 18%, ferritin level 567, folate of 15.0, and vitamin B12 level of 53 -Continue monitor for signs and symptoms of bleeding; currently no overt bleeding noted-repeat CBC in a.m.  Thrombocytopenia The patient's platelet count went from 180,000 and is now 186,000 -Continue monitor as patient is on a heparin drip and anticoagulated warfarin Continue monitor for signs and symptoms of bleeding -If continues to drop further will need to consider HIT panel if clinically do not feel he has HIT -To monitor and trend and repeat CBC  Hypomagnesemia -Patient magnesium level was extremely low at 1.6 -Continue monitor and replete with with IV mag sulfate 2 g -Repeat magnesium level in the a.m.  Hypocalcemia, improved  -Patient's calcium level now 9.2 and adjusted for albumin is a little bit higher -Continue to monitor and trend and repeat CMP in the a.m.  HyperBilirubinemia -Mild at 1.4 -> 1.0 -> 1.3 -Continue to Monitor and trend repeat CMP in a.m.  Hyponatremia -Mild at 134 Continue  monitor and trend -Repeat CMP in a.m.  GERD/Indigestion/Acid Reflux/Heartburn -Continue PPI 40 mg p.o. twice daily and Maalox -Will add Famotidine  -Avoid spicy food  DVT prophylaxis: Coumadin  Code Status: FULL CODE Family Communication: No family present at bedside  Disposition Plan: Needs SNF but refusing. Anticipating D/C Home in the Next 24-48 hours   Consultants:   Cardiology  PCCM/Pulmonary    Procedures: None   Antimicrobials:  Anti-infectives (From admission, onward)   Start     Dose/Rate Route Frequency Ordered Stop    08/08/19 1400  ceFEPIme (MAXIPIME) 2 g in sodium chloride 0.9 % 100 mL IVPB     2 g 200 mL/hr over 30 Minutes Intravenous Every 8 hours 08/08/19 0753     08/08/19 1200  vancomycin (VANCOCIN) IVPB 1000 mg/200 mL premix  Status:  Discontinued     1,000 mg 200 mL/hr over 60 Minutes Intravenous Every 12 hours 08/08/19 1053 08/09/19 0934   08/07/19 1600  vancomycin (VANCOCIN) 1,500 mg in sodium chloride 0.9 % 500 mL IVPB  Status:  Discontinued     1,500 mg 250 mL/hr over 120 Minutes Intravenous Every 24 hours 08/06/19 1054 08/08/19 1053   08/06/19 1800  ceFEPIme (MAXIPIME) 2 g in sodium chloride 0.9 % 100 mL IVPB  Status:  Discontinued     2 g 200 mL/hr over 30 Minutes Intravenous Every 12 hours 08/06/19 0938 08/08/19 0753   08/06/19 1000  ceFEPIme (MAXIPIME) 2 g in sodium chloride 0.9 % 100 mL IVPB  Status:  Discontinued     2 g 200 mL/hr over 30 Minutes Intravenous Every 12 hours 08/06/19 0837 08/06/19 0938   08/06/19 0900  vancomycin (VANCOCIN) 1,500 mg in sodium chloride 0.9 % 500 mL IVPB     1,500 mg 250 mL/hr over 120 Minutes Intravenous  Once 08/06/19 0832 08/06/19 1137   08/06/19 0800  cefTRIAXone (ROCEPHIN) 2 g in sodium chloride 0.9 % 100 mL IVPB  Status:  Discontinued     2 g 200 mL/hr over 30 Minutes Intravenous Every 24 hours 08/06/19 0623 08/06/19 0816   08/06/19 0630  azithromycin (ZITHROMAX) 500 mg in sodium chloride 0.9 % 250 mL IVPB  Status:  Discontinued     500 mg 250 mL/hr over 60 Minutes Intravenous Daily 08/06/19 0623 08/06/19 0816   08/06/19 0530  vancomycin (VANCOCIN) 2,000 mg in sodium chloride 0.9 % 500 mL IVPB     2,000 mg 250 mL/hr over 120 Minutes Intravenous  Once 08/06/19 0448 08/06/19 0735   08/06/19 0500  ceFEPIme (MAXIPIME) 2 g in sodium chloride 0.9 % 100 mL IVPB     2 g 200 mL/hr over 30 Minutes Intravenous NOW 08/06/19 0447 08/06/19 0536     Subjective: Seen and examined at bedside states that his respiratory status was good but he is complaining of  significant acid reflux and heartburn.  States he ate a sausage this morning which he knew he should not have.  No nausea or vomiting and chest pain is improved.  Still using the [place] to help suction and will transition to p.o. antibiotics as his respiratory status is stable and he is off of oxygen.  No other concerns expressed this time but he was very distressed due to his reflux.  Objective: Vitals:   08/11/19 0529 08/11/19 0813 08/11/19 1129 08/11/19 1325  BP: (!) 164/68   (!) 156/80  Pulse: 93   97  Resp: 18   18  Temp: 97.8 F (36.6 C)  97.7 F (36.5 C)  TempSrc: Oral   Oral  SpO2: 95% 96% 90% 93%  Weight:      Height:        Intake/Output Summary (Last 24 hours) at 08/11/2019 1544 Last data filed at 08/11/2019 1300 Gross per 24 hour  Intake 580 ml  Output 2450 ml  Net -1870 ml   Filed Weights   08/09/19 0500 08/10/19 0622 08/11/19 0526  Weight: 97.5 kg 90.4 kg 89 kg   Examination: Physical Exam:  Constitutional: WN/WD overweight Caucasian male appears uncomfortable and mildly distressed due to his GERD Eyes: Lids and conjunctivae normal, sclerae anicteric  ENMT: External Ears, Nose appear normal. Grossly normal hearing. Mucous membranes are moist. Neck: Appears normal, supple, no cervical masses, normal ROM, no appreciable thyromegaly; no JVD Respiratory: Diminished to auscultation bilaterally with coarse breath sounds and mild rhonchi;  no wheezing, rales, or crackles. Normal respiratory effort and patient is not tachypenic. No accessory muscle use. Unlabored breathing  Cardiovascular: RRR, no murmurs / rubs / gallops. S1 and S2 auscultated. Mild LE edema Abdomen: Soft, non-tender, Distended. Bowel sounds positive x4.  GU: Deferred. Musculoskeletal: No clubbing / cyanosis of digits/nails. No joint deformity upper and lower extremities.  Skin: No rashes, lesions, ulcers on a limited skin evaluation. No induration; Warm and dry.  Neurologic: CN 2-12 grossly intact  with no focal deficits. Romberg sign and cerebellar reflexes not assessed.  Psychiatric: Normal judgment and insight. Alert and oriented x 3. Anxious mood and appropriate affect.   Data Reviewed: I have personally reviewed following labs and imaging studies  CBC: Recent Labs  Lab 08/06/19 0430 08/07/19 0522 08/08/19 0156 08/09/19 0236 08/10/19 0358 08/11/19 0454  WBC 15.9* 13.5* 9.3 6.6 7.1 7.7  NEUTROABS 14.3*  --   --  5.5 5.7 5.5  HGB 16.9 12.1* 10.4* 11.1* 12.6* 13.0  HCT 50.6 37.5* 31.5* 34.0* 39.1 39.4  MCV 93.2 97.2 94.3 95.2 94.9 92.5  PLT 307 180 149* 154 182 186   Basic Metabolic Panel: Recent Labs  Lab 08/07/19 0522 08/08/19 0156 08/09/19 0236 08/10/19 0358 08/11/19 0454  NA 136 134* 135 134* 134*  K 3.8 3.5 3.9 3.9 3.6  CL 108 106 105 103 102  CO2 18* 17* 19* 19* 20*  GLUCOSE 94 112* 108* 97 99  BUN 26* 25* 17 13 13   CREATININE 1.24 1.00 0.84 0.84 0.69  CALCIUM 7.6* 7.7* 8.2* 9.0 9.2  MG  --  1.2* 1.5* 1.6* 1.6*  PHOS  --  2.7 3.0 3.0 3.2   GFR: Estimated Creatinine Clearance: 91.6 mL/min (by C-G formula based on SCr of 0.69 mg/dL). Liver Function Tests: Recent Labs  Lab 08/06/19 0430 08/08/19 0156 08/09/19 0236 08/10/19 0358 08/11/19 0454  AST 21 13* 11* 14* 14*  ALT 11 11 10 10 11   ALKPHOS 71 57 57 68 69  BILITOT 1.1 1.0 1.4* 1.0 1.3*  PROT 7.4 5.4* 5.8* 6.4* 6.7  ALBUMIN 4.0 2.6* 2.6* 2.8* 3.0*   No results for input(s): LIPASE, AMYLASE in the last 168 hours. No results for input(s): AMMONIA in the last 168 hours. Coagulation Profile: Recent Labs  Lab 08/06/19 0430 08/08/19 0156 08/09/19 0236 08/10/19 0358 08/11/19 0454  INR 1.7* 1.9* 1.9* 2.5* 3.0*   Cardiac Enzymes: No results for input(s): CKTOTAL, CKMB, CKMBINDEX, TROPONINI in the last 168 hours. BNP (last 3 results) No results for input(s): PROBNP in the last 8760 hours. HbA1C: No results for input(s): HGBA1C in the last 72 hours. CBG:  No results for input(s): GLUCAP in  the last 168 hours. Lipid Profile: No results for input(s): CHOL, HDL, LDLCALC, TRIG, CHOLHDL, LDLDIRECT in the last 72 hours. Thyroid Function Tests: No results for input(s): TSH, T4TOTAL, FREET4, T3FREE, THYROIDAB in the last 72 hours. Anemia Panel: Recent Labs    08/10/19 0358  VITAMINB12 53*  FOLATE 15.0  FERRITIN 567*  TIBC 171*  IRON 31*  RETICCTPCT 2.0   Sepsis Labs: Recent Labs  Lab 08/06/19 0430 08/06/19 0839 08/06/19 1106 08/07/19 0522 08/08/19 0156  PROCALCITON  --  15.48  --  9.91 5.20  LATICACIDVEN 2.8* 1.8 2.1*  --   --     Recent Results (from the past 240 hour(s))  SARS Coronavirus 2 by RT PCR (hospital order, performed in [place] hospital lab) Nasopharyngeal Nasopharyngeal Swab     Status: None   Collection Time: 08/06/19  4:09 AM   Specimen: Nasopharyngeal Swab  Result Value Ref Range Status   SARS Coronavirus 2 NEGATIVE NEGATIVE Final    Comment: (NOTE) If result is NEGATIVE SARS-CoV-2 target nucleic acids are NOT DETECTED. The SARS-CoV-2 RNA is generally detectable in upper and lower  respiratory specimens during the acute phase of infection. The lowest  concentration of SARS-CoV-2 viral copies this assay can detect is 250  copies / mL. A negative result does not preclude SARS-CoV-2 infection  and should not be used as the sole basis for treatment or other  patient management decisions.  A negative result may occur with  improper specimen collection / handling, submission of specimen other  than nasopharyngeal swab, presence of viral mutation(s) within the  areas targeted by this assay, and inadequate number of viral copies  (<250 copies / mL). A negative result must be combined with clinical  observations, patient history, and epidemiological information. If result is POSITIVE SARS-CoV-2 target nucleic acids are DETECTED. The SARS-CoV-2 RNA is generally detectable in upper and lower  respiratory specimens dur ing the acute phase of  infection.  Positive  results are indicative of active infection with SARS-CoV-2.  Clinical  correlation with patient history and other diagnostic information is  necessary to determine patient infection status.  Positive results do  not rule out bacterial infection or co-infection with other viruses. If result is PRESUMPTIVE POSTIVE SARS-CoV-2 nucleic acids MAY BE PRESENT.   A presumptive positive result was obtained on the submitted specimen  and confirmed on repeat testing.  While 2019 novel coronavirus  (SARS-CoV-2) nucleic acids may be present in the submitted sample  additional confirmatory testing may be necessary for epidemiological  and / or clinical management purposes  to differentiate between  SARS-CoV-2 and other Sarbecovirus currently known to infect humans.  If clinically indicated additional testing with an alternate test  methodology [phone]) is advised. The SARS-CoV-2 RNA is generally  detectable in upper and lower respiratory sp ecimens during the acute  phase of infection. The expected result is Negative. Fact Sheet for Patients:  StrictlyIdeas.no Fact Sheet for Healthcare Providers: BankingDealers.co.za This test is not yet approved or cleared by the [place] FDA and has been authorized for detection and/or diagnosis of SARS-CoV-2 by FDA under an Emergency Use Authorization (EUA).  This EUA will remain in effect (meaning this test can be used) for the duration of the COVID-19 declaration under Section 564(b)(1) of the Act, 21 U.S.C. section 360bbb-3(b)(1), unless the authorization is terminated or revoked sooner. Performed at [facility], [place] [address], [place], [place] [address]   Blood  culture (routine x 2)     Status: None   Collection Time: 08/06/19  4:30 AM   Specimen: BLOOD RIGHT HAND  Result Value Ref Range Status   Specimen Description BLOOD RIGHT HAND  Final   Special Requests    Final    BOTTLES DRAWN AEROBIC AND ANAEROBIC Blood Culture adequate volume   Culture   Final    NO GROWTH 5 DAYS Performed at [facility], [address], [place], [place] [address]    Report Status 08/11/2019 FINAL  Final  Blood culture (routine x 2)     Status: Abnormal   Collection Time: 08/06/19  4:30 AM   Specimen: BLOOD  Result Value Ref Range Status   Specimen Description BLOOD RIGHT ANTECUBITAL  Final   Special Requests   Final    BOTTLES DRAWN AEROBIC AND ANAEROBIC Blood Culture adequate volume   Culture  Setup Time   Final    GRAM POSITIVE COCCI CRITICAL RESULT CALLED TO, READ BACK BY AND VERIFIED WITH: J GRIMSLEY PHARMD 08/07/19 0141 JDW IN BOTH AEROBIC AND ANAEROBIC BOTTLES    Culture (A)  Final    STAPHYLOCOCCUS SPECIES (COAGULASE NEGATIVE) THE SIGNIFICANCE OF ISOLATING THIS ORGANISM FROM A SINGLE SET OF BLOOD CULTURES WHEN MULTIPLE SETS ARE DRAWN IS UNCERTAIN. PLEASE NOTIFY THE MICROBIOLOGY DEPARTMENT WITHIN ONE WEEK IF SPECIATION AND SENSITIVITIES ARE REQUIRED. Performed at [facility], [address] [place], [place] [address]    Report Status 08/09/2019 FINAL  Final  Culture, sputum-assessment     Status: None   Collection Time: 08/06/19  6:23 AM   Specimen: Sputum  Result Value Ref Range Status   Specimen Description SPUTUM  Final   Special Requests NONE  Final   Sputum evaluation   Final    THIS SPECIMEN IS ACCEPTABLE FOR SPUTUM CULTURE Performed at [facility], [address], [place], [place] [address]    Report Status 08/07/2019 FINAL  Final  Culture, respiratory     Status: None   Collection Time: 08/06/19  6:23 AM   Specimen: SPU  Result Value Ref Range Status   Specimen Description   Final    SPUTUM Performed at [facility], [address] [place], [place] [address]    Special Requests   Final    NONE Reflexed from [id number] Performed at [facility], [address],  [place], [place] [address]    Gram Stain   Final    ABUNDANT WBC PRESENT,BOTH PMN AND MONONUCLEAR FEW GRAM POSITIVE COCCI FEW GRAM POSITIVE RODS Performed at [facility], [address], [place], [place] [address]    Culture FEW ESCHERICHIA COLI  Final   Report Status 08/10/2019 FINAL  Final   Organism ID, Bacteria ESCHERICHIA COLI  Final      Susceptibility   Escherichia coli - MIC*    AMPICILLIN 8 SENSITIVE Sensitive     CEFAZOLIN <=4 SENSITIVE Sensitive     CEFEPIME <=1 SENSITIVE Sensitive     CEFTAZIDIME <=1 SENSITIVE Sensitive     CEFTRIAXONE <=1 SENSITIVE Sensitive     CIPROFLOXACIN >=4 RESISTANT Resistant     GENTAMICIN <=1 SENSITIVE Sensitive     IMIPENEM <=0.25 SENSITIVE Sensitive     TRIMETH/SULFA <=20 SENSITIVE Sensitive     AMPICILLIN/SULBACTAM 4 SENSITIVE Sensitive     PIP/TAZO <=4 SENSITIVE Sensitive     Extended ESBL NEGATIVE Sensitive     * FEW ESCHERICHIA COLI  Culture,  Urine     Status: Abnormal   Collection Time: 08/06/19  8:36 AM   Specimen: Urine, Clean Catch  Result Value Ref Range Status   Specimen Description   Final    URINE, CLEAN CATCH Performed at [facility], [address], [place], [place] [address]    Special Requests   Final    NONE Performed at [facility], [address], [place], [place] [address]    Culture (A)  Final    <10,000 COLONIES/mL INSIGNIFICANT GROWTH Performed at [facility], [address], [place], [place] [address]    Report Status 08/07/2019 FINAL  Final  MRSA PCR Screening     Status: None   Collection Time: 08/07/19  9:30 PM   Specimen: Nasal Mucosa; Nasopharyngeal  Result Value Ref Range Status   MRSA by PCR NEGATIVE NEGATIVE Final    Comment:        The GeneXpert MRSA Assay (FDA approved for NASAL specimens only), is one component of a comprehensive MRSA colonization surveillance program. It is not intended to diagnose MRSA infection nor to guide or monitor  treatment for MRSA infections. Performed at [facility], [address] [place], [place] [address]   Culture, blood (routine x 2)     Status: None (Preliminary result)   Collection Time: 08/10/19  4:44 PM   Specimen: BLOOD  Result Value Ref Range Status   Specimen Description   Final    BLOOD RIGHT ANTECUBITAL Performed at [facility], [address], [place], [place] [address]    Special Requests   Final    BOTTLES DRAWN AEROBIC AND ANAEROBIC Blood Culture adequate volume Performed at [facility], [address], [place], [place] [address]    Culture   Final    NO GROWTH < 12 HOURS Performed at [facility], [address], [place], [place] [address]    Report Status PENDING  Incomplete  Culture, blood (routine x 2)     Status: None (Preliminary result)   Collection Time: 08/10/19  4:49 PM   Specimen: BLOOD  Result Value Ref Range Status   Specimen Description   Final    BLOOD RIGHT ARM Performed at [facility], [address] [place], [place] [address]    Special Requests   Final    BOTTLES DRAWN AEROBIC AND ANAEROBIC Blood Culture adequate volume Performed at [facility], [address], [place], [place] [address]    Culture   Final    NO GROWTH < 12 HOURS Performed at [facility], [address], [place], [place] [address]    Report Status PENDING  Incomplete     RN Pressure Injury Documentation: Pressure Injury 08/06/19 Sacrum Mid Stage I -  Intact skin with non-blanchable redness of a localized area usually over a bony prominence. redness on sacrum (Active)  08/06/19 0844  Location: Sacrum  Location Orientation: Mid  Staging: Stage I -  Intact skin with non-blanchable redness of a localized area usually over a bony prominence.  Wound Description (Comments): redness on sacrum  Present on Admission: Yes   Radiology Studies: Dg Chest Port 1 View   Result Date: 08/11/2019 CLINICAL DATA:  Short of breath. EXAM: PORTABLE CHEST 1 VIEW COMPARISON:  08/10/2019 FINDINGS: Normal heart size. Marked chronic interstitial coarsening is identified bilaterally. Superimposed airspace opacities within the left midlung and left base appear unchanged. Mild patchy opacity  within the periphery of the right upper lobe is unchanged. Similar appearance of small pleural effusions. IMPRESSION: 1. No change in aeration to the lungs compared with prior exam. 2. Stable patchy opacity within the periphery of the right upper lobe. 3. Stable small pleural effusions. Electronically Signed   By: Signa Kell M.D.   On: 08/11/2019 06:29   Dg Chest Port 1 View  Result Date: 08/10/2019 CLINICAL DATA:  Shortness of breath and cough EXAM: PORTABLE CHEST 1 VIEW COMPARISON:  08/09/2019 FINDINGS: Cardiac shadow is stable. Lungs are well aerated bilaterally. Patchy parenchymal opacities are again seen slightly greater on the left than the right and predominately within the bases. The overall appearance is stable from the prior study. Small left pleural effusion is noted. No acute bony abnormality is seen. IMPRESSION: Stable predominately bibasilar pulmonary opacities. Left pleural effusion. Electronically Signed   By: Alcide Clever M.D.   On: 08/10/2019 07:22    Scheduled Meds: . allopurinol  300 mg Oral Daily  . cilostazol  100 mg Oral BID  . diazepam  2 mg Oral QPM  . famotidine  20 mg Oral BID  . guaiFENesin  600 mg Oral BID  . HYDROcodone-acetaminophen  1 tablet Oral Q4H  . ipratropium-albuterol  3 mL Nebulization QID  . mouth rinse  15 mL Mouth Rinse BID  . nebivolol  10 mg Oral Daily  . pantoprazole  40 mg Oral BID  . simvastatin  40 mg Oral q1800  . warfarin  1 mg Oral ONCE-1800  . Warfarin - Pharmacist Dosing Inpatient   Does not apply q1800   Continuous Infusions: . ceFEPime (MAXIPIME) IV 2 g (08/11/19 1520)    LOS: 5 days   Merlene Laughter, DO [facility] PAGER is on AMION  If 7PM-7AM, please contact night-coverage [email or web address] Password [facility] 08/11/2019, 3:44 PM

## 2019-08-11 NOTE — Progress Notes (Signed)
[place] for heparin/coumadin Indication: VTE treatment  Allergies  Allergen Reactions  . Codeine Hives and Other (See Comments)    Takes benadryl to stop reaction    Patient Measurements: Height: 6\' 1"  (185.4 cm) Weight: 196 lb 3.2 oz (89 kg) IBW/kg (Calculated) : 79.9  Vital Signs: Temp: 97.8 F (36.6 C) (10/24 0529) Temp Source: Oral (10/24 0529) BP: 164/68 (10/24 0529) Pulse Rate: 93 (10/24 0529)  Labs: Recent Labs    08/08/19 1206 08/08/19 2033  08/09/19 0236 08/09/19 0923 08/10/19 0358 08/11/19 0454  HGB  --   --    < > 11.1*  --  12.6* 13.0  HCT  --   --   --  34.0*  --  39.1 39.4  PLT  --   --   --  154  --  182 186  LABPROT  --   --   --  21.7*  --  26.7* 30.6*  INR  --   --   --  1.9*  --  2.5* 3.0*  HEPARINUNFRC 0.21* 0.21*  --   --  0.31 0.47  --   CREATININE  --   --   --  0.84  --  0.84 0.69  TROPONINIHS 69*  --   --   --   --   --   --    < > = values in this interval not displayed.    Estimated Creatinine Clearance: 91.6 mL/min (by C-G formula based on SCr of 0.69 mg/dL).   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  Pharmacy consulted to dose coumadin.  PTA coumadin 4mg  po daily, last dose 10/18 at 1600.  08/11/2019 - INR = 3, at high end of goal - No bleeding issues noted per RN - CBC stable   Goal of Therapy:  INR 2-3   Plan:  Coumadin 1 mg po x 1 dose Daily INR Monitor for s/sx of bleeding  Eudelia Bunch, Pharm.D [phone] 08/11/2019 11:14 AM

## 2019-08-11 NOTE — Progress Notes (Signed)
Pt satting 88% on RA. Place on 2L [place] and satting 95%

## 2019-08-12 LAB — CBC WITH DIFFERENTIAL/PLATELET
Abs Immature Granulocytes: 0.96 10*3/uL — ABNORMAL HIGH (ref 0.00–0.07)
Basophils Absolute: 0.1 10*3/uL (ref 0.0–0.1)
Basophils Relative: 1 %
Eosinophils Absolute: 0.5 10*3/uL (ref 0.0–0.5)
Eosinophils Relative: 5 %
HCT: 40.6 % (ref 39.0–52.0)
Hemoglobin: 13.3 g/dL (ref 13.0–17.0)
Immature Granulocytes: 10 %
Lymphocytes Relative: 7 %
Lymphs Abs: 0.7 10*3/uL (ref 0.7–4.0)
MCH: 30.4 pg (ref 26.0–34.0)
MCHC: 32.8 g/dL (ref 30.0–36.0)
MCV: 92.9 fL (ref 80.0–100.0)
Monocytes Absolute: 0.8 10*3/uL (ref 0.1–1.0)
Monocytes Relative: 9 %
Neutro Abs: 6.7 10*3/uL (ref 1.7–7.7)
Neutrophils Relative %: 68 %
Platelets: 223 10*3/uL (ref 150–400)
RBC: 4.37 MIL/uL (ref 4.22–5.81)
RDW: 15.1 % (ref 11.5–15.5)
WBC: 9.8 10*3/uL (ref 4.0–10.5)
nRBC: 0 % (ref 0.0–0.2)

## 2019-08-12 LAB — COMPREHENSIVE METABOLIC PANEL
ALT: 12 U/L (ref 0–44)
AST: 16 U/L (ref 15–41)
Albumin: 3 g/dL — ABNORMAL LOW (ref 3.5–5.0)
Alkaline Phosphatase: 71 U/L (ref 38–126)
Anion gap: 12 (ref 5–15)
BUN: 14 mg/dL (ref 8–23)
CO2: 22 mmol/L (ref 22–32)
Calcium: 9.2 mg/dL (ref 8.9–10.3)
Chloride: 101 mmol/L (ref 98–111)
Creatinine, Ser: 0.67 mg/dL (ref 0.61–1.24)
GFR calc Af Amer: >60 mL/min (ref >60)
GFR calc non Af Amer: >60 mL/min (ref >60)
Glucose, Bld: 98 mg/dL (ref 70–99)
Potassium: 3.4 mmol/L — ABNORMAL LOW (ref 3.5–5.1)
Sodium: 135 mmol/L (ref 135–145)
Total Bilirubin: 1.2 mg/dL (ref 0.3–1.2)
Total Protein: 6.8 g/dL (ref 6.5–8.1)

## 2019-08-12 LAB — PROTIME-INR
INR: 3.3 — ABNORMAL HIGH (ref 0.8–1.2)
Prothrombin Time: 33.1 seconds — ABNORMAL HIGH (ref 11.4–15.2)

## 2019-08-12 LAB — PHOSPHORUS: Phosphorus: 3.2 mg/dL (ref 2.5–4.6)

## 2019-08-12 LAB — MAGNESIUM: Magnesium: 1.7 mg/dL (ref 1.7–2.4)

## 2019-08-12 MED ORDER — POTASSIUM CHLORIDE CRYS ER 20 MEQ PO TBCR
40.0000 meq | EXTENDED_RELEASE_TABLET | Freq: Two times a day (BID) | ORAL | Status: AC
  Administered 2019-08-12 (×2): 40 meq via ORAL
  Filled 2019-08-12 (×2): qty 2

## 2019-08-12 MED ORDER — POLYSACCHARIDE IRON COMPLEX 150 MG PO CAPS
150.0000 mg | ORAL_CAPSULE | Freq: Every day | ORAL | Status: DC
  Administered 2019-08-12 – 2019-08-14 (×3): 150 mg via ORAL
  Filled 2019-08-12 (×2): qty 1

## 2019-08-12 MED ORDER — CYANOCOBALAMIN 1000 MCG/ML IJ SOLN
1000.0000 ug | Freq: Once | INTRAMUSCULAR | Status: DC
  Filled 2019-08-12: qty 1

## 2019-08-12 MED ORDER — WARFARIN 0.5 MG HALF TABLET
0.5000 mg | ORAL_TABLET | Freq: Once | ORAL | Status: AC
  Administered 2019-08-12: 0.5 mg via ORAL
  Filled 2019-08-12: qty 1

## 2019-08-12 MED ORDER — MAGNESIUM SULFATE 2 GM/50ML IV SOLN
2.0000 g | Freq: Once | INTRAVENOUS | Status: AC
  Administered 2019-08-12: 2 g via INTRAVENOUS
  Filled 2019-08-12: qty 50

## 2019-08-12 MED ORDER — SODIUM CHLORIDE 0.9 % IV SOLN
INTRAVENOUS | Status: DC
  Administered 2019-08-12 – 2019-08-13 (×2): via INTRAVENOUS

## 2019-08-12 NOTE — Progress Notes (Signed)
Pt noted with some confusion this evening. Also trying to get OOB unassisted. When asked where he was headed, pt reported that he was outside and "I want to pee".Pt had been asleep.  O2 sats at 88% on room air. O2 placed at 2 liters [place]. Pt reoriented. Pt went back to bed after using the bathroom.  VWilliams,RN.

## 2019-08-12 NOTE — Progress Notes (Signed)
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  Patient reports increasing cough with shortness of breath congestion.  Complaint of left-sided chest pain worsened on deep breathing, subjective fevers and chills. In the ED he was in septic shock with hypoxic respiratory failure (O2 sats 77% on room air), fever 102.6 F, tachycardic, hypotensive, lactic acid of 2.8, WC of 15 .9K and AKI with creatinine 1.45.  Also had elevated troponin with no ischemic changes on EKG.  Chest x-ray showed patchy groundglass opacities in the left mid and lower lung lobe concerning for multifocal pneumonia.  Was placed on nonrebreather and started empiric antibiotic.  COVID-19 was tested negative. Admitted to stepdown unit for septic shock.  PCCM involved in care.    **Interim History A few nights ago he was hypotensive briefly requiring Levophed and was weaned off of it.  Blood pressures remained stable but he still complains of cough and left-sided chest pain on inspiration and felt it is a deep stabbing pain.  N longer on supplemental oxygen via nasal cannula and is improving slightly and is suctioning his has helped. HR has been improving. He is doing well on Abx and will transition to po. PT/OT recommending SNF but patient refusing and wanting to go home. Patient's Heparin gtt was now stopped.  Hospitalization has been complicated by GERD and reflux so we have added clonidine.  Patient is extremely adamant about going home and does not want to go to a skilled nursing facility at all.  Antibiotics have been changed to p.o. today and  respiratory status is stable and will adjust the patient to GERD and reflux.    The patient felt "swimmy headed" and lightheaded.  States that he did have some reflux this morning.  We will start IV fluid hydration again and continue to monitor clinical response.  Heart rate elevated again.  Assessment & Plan:   Principal Problem:   Sepsis due to pneumonia [facility]) Active Problems:   DVT, lower extremity and pulmonary embolism, recurrent   Hyperlipidemia   Gout   COPD (chronic obstructive pulmonary disease) (HCC)   Multifocal pneumonia   AKI (acute kidney injury) (HCC)   Pressure injury of skin   Acute respiratory failure with hypoxia (HCC)   Demand ischemia (HCC)  Severe Sepsis with septic shock (HCC), improved  Acute respiratory failure with hypoxemia, improving  -Secondary to multifocal pneumonia.  Initially on NRB, transitioned to 6 L via nasal cannula and now on 2 Liters and Nurse weaned to Room Air and saturating in the 90's.  Also required pressors briefly during the night for hypotension.  Now stable. -Continued IV fluids but are now discontinued  -Continued Empiric Cefepime and changed to po Cefdinir. Discontinue IV Vancomycin.  Follow blood cultures and 1/2 Showed Staph Species and likely contaminant. -Repeated Blood Cx x2 and showed NGTD at 2 Days  -C/w Supportive care with antitussives and Vicodin for pain.  Continue as needed nebs -Wean oxygen as tolerated, incentive spirometry. -Continue stepdown monitoring.  WBC and subsequent lactic acid improving -Continue to Monitor for Infection  -CXR this AM showed "No change in aeration to the lungs compared with prior exam.  Stable patchy opacity within the periphery of the right upper lobe. Stable small pleural effusions" -Check SLP Evaluation and he was placed on a Dysphagia 3 Diet with Thin Liquids -Continue to Wean O2 and now off of it -C/w Xopenex 0.63 mg Neb q2hprn Wheezing and SOB -C/w DuoNeb 3 mL 4 times Daily and  Guaifenesin 1200 mg po BID -C/w Flutter Valve and Incentive Spirometry  -Patient is improving and recommending up in Chair -PT/OT recommending SNF but patient refusing and does not have very much family support at home-Restarted IVF at a rate of 75 mL/hr -He is again Tachycardic   Left-sided pleuritic chest pain, improved.  -Likely pleurisy from multifocal pneumonia.   -Bedside ultrasound was done showing small amount of pleural fluid without effusion or loculation. -Pain control with Vicodin and given a 1x dose of Morphine.  Empiric antibiotics, DuoNeb and Vicodin as needed for pain. -Will obtain CT of the chest if respiratory symptoms unimproved and has persistent pain but it is improved today so will not obtain   DVT, lower extremity and pulmonary embolism, recurrent -INR was subtherapeutic.  Markedly elevated D-dimer.  Given presentation of septic shock with multifocal pneumonia respiratory symptoms are likely due to infection rather than PE. -C/w Coumadin with Pharmacy to Dose and continued with IV heparin bridge until today. INR was 3.3 today  -Doppler lower extremity negative for DVT.  Elevated troponin/history of CAD -No EKG changes.  Troponin trending down and last Two Troponin was 59 and 69.  Cardiology consult appreciated and feel it is pleuritic.  Suspect demand ischemia. -Recent 2D echo 1 month back showing normal EF and impaired LV relaxation -Cardiology did not feel there is any acute cardiac concerns at this time and have signed off the case  Hyperlipidemia/PAD -Lipid panel was checked and showed a total cholesterol/HDL ratio of 2.6, cholesterol level of 71, HDL 27, LDL 20, triglycerides 119, VLDL 24 -Continue statin and cilostazol  COPD (chronic obstructive pulmonary disease) (HCC) -Continue scheduled nebs.  Not in acute exacerbation -Continue Supplemental O2 via [place] and this has been weaned   AKI (acute kidney injury) (HCC) Metabolic Acidosis -ATN secondary to  septic shock.  Improving a.m. labs with fluids and pressors -Patient's CO2 is now 22 and AG is 12 and Chloride is 101 -Continue to Monitor and Trend Renal Fxn -Patient's BUN/Cr is now 14/0.67 -Continue to Monitor Renal Fxn and Repeat CMP in the AM  Leukocytosis -Improving. WBC went from 13.5 -> 9.3 -> 6.6 -> 7.1 -> 7.7 -> 9.8 -C/w Abx as delineated as above -Continue to Monitor and trend -Procalcitonin is trending down  Normocytic Anemia B12 Deficiency  -Patient's hemoglobin/hematocrit went from 12.1/37.5 is now 13.3/40.6 -Checked Anemia Panel and showed iron level of 31, U IBC 140, TIBC 171, saturation ratios of 18%, ferritin level 567, folate of 15.0, and vitamin B12 level of 53 -Will start Niferex and Vitamin B12 Supplementation -Continue monitor for signs and symptoms of bleeding; currently no overt bleeding noted-repeat CBC in a.m.  Thrombocytopenia The patient's platelet count went from 180,000 and is now 223,000 -Continue monitor as patient is on a heparin drip and anticoagulated warfarin Continue monitor for signs and symptoms of bleeding -If continues to drop further will need to consider HIT panel if clinically do not feel he has HIT -To monitor and trend and repeat CBC  Hypomagnesemia -Patient magnesium level was extremely low at 1.7 -Continue monitor and replete with with IV mag sulfate 2 g -Repeat magnesium level in the  a.m.  Hypocalcemia, improved  -Patient's calcium level now 9.2 and adjusted for albumin is a little bit higher -Continue to monitor and trend and repeat CMP in the a.m.  HyperBilirubinemia -Mild at 1.4 -> 1.0 -> 1.3 -> 1.2 -Continue to Monitor and trend repeat CMP in a.m.  Hyponatremia -Mild at 134 yesterday and today was 135 -NS at 75 mL/hr started  Continue monitor and trend -Repeat CMP in a.m.  GERD/Indigestion/Acid Reflux/Heartburn -Continue PPI 40 mg p.o. twice daily and Maalox -Will add Famotidine  -Avoid spicy food   Dizziness/Lightheadness -Felt lightheaded -Start NS at 75 mL/hr -Check Orthostatic Vital Signs -IF not improving will consider Head CT  Hemoptysis -Mild in the setting of PNA -Continue to Monitor while Anticoagulated -Repeat CXR in AM and continue to Monitor quantity   DVT prophylaxis: Coumadin  Code Status: FULL CODE Family Communication: No family present at bedside  Disposition Plan: Needs SNF but refusing. Anticipating D/C Home in the Next 24-48 hours   Consultants:   Cardiology  PCCM/Pulmonary    Procedures: None   Antimicrobials:  Anti-infectives (From admission, onward)   Start     Dose/Rate Route Frequency Ordered Stop   08/11/19 2200  amoxicillin-clavulanate (AUGMENTIN) 875-125 MG per tablet 1 tablet  Status:  Discontinued     1 tablet Oral Every 12 hours 08/11/19 1553 08/11/19 1555   08/11/19 2200  cefdinir (OMNICEF) capsule 300 mg     300 mg Oral Every 12 hours 08/11/19 1556     08/08/19 1400  ceFEPIme (MAXIPIME) 2 g in sodium chloride 0.9 % 100 mL IVPB  Status:  Discontinued     2 g 200 mL/hr over 30 Minutes Intravenous Every 8 hours 08/08/19 0753 08/11/19 1553   08/08/19 1200  vancomycin (VANCOCIN) IVPB 1000 mg/200 mL premix  Status:  Discontinued     1,000 mg 200 mL/hr over 60 Minutes Intravenous Every 12 hours 08/08/19 1053 08/09/19 0934   08/07/19 1600  vancomycin (VANCOCIN) 1,500 mg in sodium chloride 0.9 % 500 mL IVPB  Status:  Discontinued     1,500 mg 250 mL/hr over 120 Minutes Intravenous Every 24 hours 08/06/19 1054 08/08/19 1053   08/06/19 1800  ceFEPIme (MAXIPIME) 2 g in sodium chloride 0.9 % 100 mL IVPB  Status:  Discontinued     2 g 200 mL/hr over 30 Minutes Intravenous Every 12 hours 08/06/19 0938 08/08/19 0753   08/06/19 1000  ceFEPIme (MAXIPIME) 2 g in sodium chloride 0.9 % 100 mL IVPB  Status:  Discontinued     2 g 200 mL/hr over 30 Minutes Intravenous Every 12 hours 08/06/19 0837 08/06/19 0938   08/06/19 0900  vancomycin (VANCOCIN) 1,500  mg in sodium chloride 0.9 % 500 mL IVPB     1,500 mg 250 mL/hr over 120 Minutes Intravenous  Once 08/06/19 0832 08/06/19 1137   08/06/19 0800  cefTRIAXone (ROCEPHIN) 2 g in sodium chloride 0.9 % 100 mL IVPB  Status:  Discontinued     2 g 200 mL/hr over 30 Minutes Intravenous Every 24 hours 08/06/19 0623 08/06/19 0816   08/06/19 0630  azithromycin (ZITHROMAX) 500 mg in sodium chloride 0.9 % 250 mL IVPB  Status:  Discontinued     500 mg 250 mL/hr over 60 Minutes Intravenous Daily 08/06/19 0623 08/06/19 0816   08/06/19 0530  vancomycin (VANCOCIN) 2,000 mg in sodium chloride 0.9 % 500 mL IVPB     2,000 mg 250 mL/hr over 120 Minutes Intravenous  Once 08/06/19 0448 08/06/19 0735  08/06/19 0500  ceFEPIme (MAXIPIME) 2 g in sodium chloride 0.9 % 100 mL IVPB     2 g 200 mL/hr over 30 Minutes Intravenous NOW 08/06/19 0447 08/06/19 0536     Subjective: Seen and examined at bedside states he is feeling okay but had a little bit of hemoptysis and states that when he dropped from the bed to the chair he became lightheaded and swimmy headed.  Had to lay back down and now back on 2 Liters of O2. No chest pain or shortness of breath but still saying he still feels a little lightheaded even laying down.  No other concerns requested this time.  Objective: Vitals:   08/12/19 1119 08/12/19 1515 08/12/19 1554 08/12/19 1613  BP:   (!) 174/88 (!) 178/84  Pulse:   (!) 105   Resp:   19 20  Temp:   97.7 F (36.5 C) (!) 97.5 F (36.4 C)  TempSrc:   Oral Oral  SpO2: 93% 95% 91% 94%  Weight:      Height:        Intake/Output Summary (Last 24 hours) at 08/12/2019 1724 Last data filed at 08/12/2019 1315 Gross per 24 hour  Intake 470 ml  Output 1600 ml  Net -1130 ml   Filed Weights   08/09/19 0500 08/10/19 0622 08/11/19 0526  Weight: 97.5 kg 90.4 kg 89 kg   Examination: Physical Exam:  Constitutional: WN/WD overweight [place] male appears uncomfortable Eyes: Lids and conjunctivae normal, sclerae  anicteric  ENMT: External Ears, Nose appear normal. Grossly normal hearing. Neck: Appears normal, supple, no cervical masses, normal ROM, no appreciable thyromegaly; no JVD Respiratory: Diminished to auscultation bilaterally with coarse breath sounds and some rhonchi; No appreciable wheezing, rales, or crackles. Normal respiratory effort and patient is not tachypenic. No accessory muscle use. Back on 2 Liters of O2 but unlabored breathing  Cardiovascular: RRR, no murmurs / rubs / gallops. S1 and S2 auscultated. No extremity edema. 2+ pedal pulses. No carotid bruits.  Abdomen: Soft, Tender to palpate, Distended.  Bowel sounds positive x4.  GU: Deferred. Musculoskeletal: No clubbing / cyanosis of digits/nails. No joint deformity upper and lower extremities. .  Skin: No rashes, lesions, ulcers on a limited skin evaluation. No induration; Warm and dry.  Neurologic: CN 2-12 grossly intact with no focal deficits. Romberg sign and cerebellar reflexes not assessed.  Psychiatric: Normal judgment and insight. Alert and oriented x 3. Anxious mood and appropriate affect.   Data Reviewed: I have personally reviewed following labs and imaging studies  CBC: Recent Labs  Lab 08/06/19 0430  08/08/19 0156 08/09/19 0236 08/10/19 0358 08/11/19 0454 08/12/19 0417  WBC 15.9*   < > 9.3 6.6 7.1 7.7 9.8  NEUTROABS 14.3*  --   --  5.5 5.7 5.5 6.7  HGB 16.9   < > 10.4* 11.1* 12.6* 13.0 13.3  HCT 50.6   < > 31.5* 34.0* 39.1 39.4 40.6  MCV 93.2   < > 94.3 95.2 94.9 92.5 92.9  PLT 307   < > 149* 154 182 186 223   < > = values in this interval not displayed.   Basic Metabolic Panel: Recent Labs  Lab 08/08/19 0156 08/09/19 0236 08/10/19 0358 08/11/19 0454 08/12/19 0417  NA 134* 135 134* 134* 135  K 3.5 3.9 3.9 3.6 3.4*  CL 106 105 103 102 101  CO2 17* 19* 19* 20* 22  GLUCOSE 112* 108* 97 99 98  BUN 25* CREATININE  1.00 0.84 0.84 0.69 0.67  CALCIUM 7.7* 8.2* 9.0 9.2 9.2  MG 1.2* 1.5* 1.6*  1.6* 1.7  PHOS 2.7 3.0 3.0 3.2 3.2   GFR: Estimated Creatinine Clearance: 91.6 mL/min (by C-G formula based on SCr of 0.67 mg/dL). Liver Function Tests: Recent Labs  Lab 08/08/19 0156 08/09/19 0236 08/10/19 0358 08/11/19 0454 08/12/19 0417  AST 13* 11* 14* 14* 16  ALT 11 10 10 11 12   ALKPHOS 57 57 68 69 71  BILITOT 1.0 1.4* 1.0 1.3* 1.2  PROT 5.4* 5.8* 6.4* 6.7 6.8  ALBUMIN 2.6* 2.6* 2.8* 3.0* 3.0*   No results for input(s): LIPASE, AMYLASE in the last 168 hours. No results for input(s): AMMONIA in the last 168 hours. Coagulation Profile: Recent Labs  Lab 08/08/19 0156 08/09/19 0236 08/10/19 0358 08/11/19 0454 08/12/19 0417  INR 1.9* 1.9* 2.5* 3.0* 3.3*   Cardiac Enzymes: No results for input(s): CKTOTAL, CKMB, CKMBINDEX, TROPONINI in the last 168 hours. BNP (last 3 results) No results for input(s): PROBNP in the last 8760 hours. HbA1C: No results for input(s): HGBA1C in the last 72 hours. CBG: No results for input(s): GLUCAP in the last 168 hours. Lipid Profile: No results for input(s): CHOL, HDL, LDLCALC, TRIG, CHOLHDL, LDLDIRECT in the last 72 hours. Thyroid Function Tests: No results for input(s): TSH, T4TOTAL, FREET4, T3FREE, THYROIDAB in the last 72 hours. Anemia Panel: Recent Labs    08/10/19 0358  VITAMINB12 53*  FOLATE 15.0  FERRITIN 567*  TIBC 171*  IRON 31*  RETICCTPCT 2.0   Sepsis Labs: Recent Labs  Lab 08/06/19 0430 08/06/19 0839 08/06/19 1106 08/07/19 0522 08/08/19 0156  PROCALCITON  --  15.48  --  9.91 5.20  LATICACIDVEN 2.8* 1.8 2.1*  --   --     Recent Results (from the past 240 hour(s))  SARS Coronavirus 2 by RT PCR (hospital order, performed in [facility] hospital lab) Nasopharyngeal Nasopharyngeal Swab     Status: None   Collection Time: 08/06/19  4:09 AM   Specimen: Nasopharyngeal Swab  Result Value Ref Range Status   SARS Coronavirus 2 NEGATIVE NEGATIVE Final    Comment: (NOTE) If result is NEGATIVE SARS-CoV-2 target  nucleic acids are NOT DETECTED. The SARS-CoV-2 RNA is generally detectable in upper and lower  respiratory specimens during the acute phase of infection. The lowest  concentration of SARS-CoV-2 viral copies this assay can detect is 250  copies / mL. A negative result does not preclude SARS-CoV-2 infection  and should not be used as the sole basis for treatment or other  patient management decisions.  A negative result may occur with  improper specimen collection / handling, submission of specimen other  than nasopharyngeal swab, presence of viral mutation(s) within the  areas targeted by this assay, and inadequate number of viral copies  (<250 copies / mL). A negative result must be combined with clinical  observations, patient history, and epidemiological information. If result is POSITIVE SARS-CoV-2 target nucleic acids are DETECTED. The SARS-CoV-2 RNA is generally detectable in upper and lower  respiratory specimens dur ing the acute phase of infection.  Positive  results are indicative of active infection with SARS-CoV-2.  Clinical  correlation with patient history and other diagnostic information is  necessary to determine patient infection status.  Positive results do  not rule out bacterial infection or co-infection with other viruses. If result is PRESUMPTIVE POSTIVE SARS-CoV-2 nucleic acids MAY BE PRESENT.   A presumptive positive result was obtained on the submitted  specimen  and confirmed on repeat testing.  While 2019 novel coronavirus  (SARS-CoV-2) nucleic acids may be present in the submitted sample  additional confirmatory testing may be necessary for epidemiological  and / or clinical management purposes  to differentiate between  SARS-CoV-2 and other Sarbecovirus currently known to infect humans.  If clinically indicated additional testing with an alternate test  methodology [phone] is advised. The SARS-CoV-2 RNA is generally  detectable in upper and lower  respiratory sp ecimens during the acute  phase of infection. The expected result is Negative. Fact Sheet for Patients:  BoilerBrush.com.cyhttps://www.fda.gov/media/136312/download Fact Sheet for Healthcare Providers: https://pope.com/https://www.fda.gov/media/136313/download This test is not yet approved or cleared by the [place] FDA and has been authorized for detection and/or diagnosis of SARS-CoV-2 by FDA under an Emergency Use Authorization (EUA).  This EUA will remain in effect (meaning this test can be used) for the duration of the COVID-19 declaration under Section 564(b)(1) of the Act, 21 U.S.C. section 360bbb-3(b)(1), unless the authorization is terminated or revoked sooner. Performed at [facility], [address] [place], [place] [address]   Blood culture (routine x 2)     Status: None   Collection Time: 08/06/19  4:30 AM   Specimen: BLOOD RIGHT HAND  Result Value Ref Range Status   Specimen Description BLOOD RIGHT HAND  Final   Special Requests   Final    BOTTLES DRAWN AEROBIC AND ANAEROBIC Blood Culture adequate volume   Culture   Final    NO GROWTH 5 DAYS Performed at [facility], [address] [place], [place] [address]    Report Status 08/11/2019 FINAL  Final  Blood culture (routine x 2)     Status: Abnormal   Collection Time: 08/06/19  4:30 AM   Specimen: BLOOD  Result Value Ref Range Status   Specimen Description BLOOD RIGHT ANTECUBITAL  Final   Special Requests   Final    BOTTLES DRAWN AEROBIC AND ANAEROBIC Blood Culture adequate volume   Culture  Setup Time   Final    GRAM POSITIVE COCCI CRITICAL RESULT CALLED TO, READ BACK BY AND VERIFIED WITH: J GRIMSLEY PHARMD 08/07/19 0141 JDW IN BOTH AEROBIC AND ANAEROBIC BOTTLES    Culture (A)  Final    STAPHYLOCOCCUS SPECIES (COAGULASE NEGATIVE) THE SIGNIFICANCE OF ISOLATING THIS ORGANISM FROM A SINGLE SET OF BLOOD CULTURES WHEN MULTIPLE SETS ARE DRAWN IS UNCERTAIN. PLEASE NOTIFY THE MICROBIOLOGY DEPARTMENT  WITHIN ONE WEEK IF SPECIATION AND SENSITIVITIES ARE REQUIRED. Performed at [facility], [address] [place], [place] [address]    Report Status 08/09/2019 FINAL  Final  Culture, sputum-assessment     Status: None   Collection Time: 08/06/19  6:23 AM   Specimen: Sputum  Result Value Ref Range Status   Specimen Description SPUTUM  Final   Special Requests NONE  Final   Sputum evaluation   Final    THIS SPECIMEN IS ACCEPTABLE FOR SPUTUM CULTURE Performed at [facility], [address] [place], [place] [address]    Report Status 08/07/2019 FINAL  Final  Culture, respiratory     Status: None   Collection Time: 08/06/19  6:23 AM   Specimen: SPU  Result Value Ref Range Status   Specimen Description   Final    SPUTUM Performed at [facility], [address] [place], [place] [address]    Special Requests   Final    NONE Reflexed from [id number] Performed at [facility]  [place] [address], [place], [place] [address]    Gram Stain   Final    ABUNDANT WBC PRESENT,BOTH PMN AND MONONUCLEAR FEW GRAM POSITIVE COCCI FEW GRAM POSITIVE RODS Performed at [facility], [place] [address] [place], [place] [address]    Culture FEW ESCHERICHIA COLI  Final   Report Status 08/10/2019 FINAL  Final   Organism ID, Bacteria ESCHERICHIA COLI  Final      Susceptibility   Escherichia coli - MIC*    AMPICILLIN 8 SENSITIVE Sensitive     CEFAZOLIN <=4 SENSITIVE Sensitive     CEFEPIME <=1 SENSITIVE Sensitive     CEFTAZIDIME <=1 SENSITIVE Sensitive     CEFTRIAXONE <=1 SENSITIVE Sensitive     CIPROFLOXACIN >=4 RESISTANT Resistant     GENTAMICIN <=1 SENSITIVE Sensitive     IMIPENEM <=0.25 SENSITIVE Sensitive     TRIMETH/SULFA <=20 SENSITIVE Sensitive     AMPICILLIN/SULBACTAM 4 SENSITIVE Sensitive     PIP/TAZO <=4 SENSITIVE Sensitive     Extended ESBL NEGATIVE Sensitive     * FEW ESCHERICHIA COLI  Culture, Urine     Status: Abnormal    Collection Time: 08/06/19  8:36 AM   Specimen: Urine, Clean Catch  Result Value Ref Range Status   Specimen Description   Final    URINE, CLEAN CATCH Performed at [facility], [place] [address], [place], [place] [address]    Special Requests   Final    NONE Performed at [facility], [place] [address], [place], [place] [address]    Culture (A)  Final    <10,000 COLONIES/mL INSIGNIFICANT GROWTH Performed at [place] [address], [place], [place] [address]    Report Status 08/07/2019 FINAL  Final  MRSA PCR Screening     Status: None   Collection Time: 08/07/19  9:30 PM   Specimen: Nasal Mucosa; Nasopharyngeal  Result Value Ref Range Status   MRSA by PCR NEGATIVE NEGATIVE Final    Comment:        The GeneXpert MRSA Assay (FDA approved for NASAL specimens only), is one component of a comprehensive MRSA colonization surveillance program. It is not intended to diagnose MRSA infection nor to guide or monitor treatment for MRSA infections. Performed at [facility], [place] [address], [place], [place] [address]   Culture, blood (routine x 2)     Status: None (Preliminary result)   Collection Time: 08/10/19  4:44 PM   Specimen: BLOOD  Result Value Ref Range Status   Specimen Description   Final    BLOOD RIGHT ANTECUBITAL Performed at [place] [address], [place], [place] [address]    Special Requests   Final    BOTTLES DRAWN AEROBIC AND ANAEROBIC Blood Culture adequate volume Performed at [place] [address], [place], [place] [address]    Culture   Final    NO GROWTH 2 DAYS Performed at [place] [address] [place], [place] [address]    Report Status PENDING  Incomplete  Culture, blood (routine x 2)     Status: None (Preliminary result)   Collection Time: 08/10/19  4:49 PM   Specimen: BLOOD  Result Value Ref Range Status   Specimen  Description   Final    BLOOD RIGHT ARM Performed at [place] [address] [place], [place] [address]    Special Requests   Final  BOTTLES DRAWN AEROBIC AND ANAEROBIC Blood Culture adequate volume Performed at [facility], [address] [place], [place] [address]    Culture   Final    NO GROWTH 2 DAYS Performed at [facility], [address] [place], [place] [address]    Report Status PENDING  Incomplete     RN Pressure Injury Documentation: Pressure Injury 08/06/19 Sacrum Mid Stage I -  Intact skin with non-blanchable redness of a localized area usually over a bony prominence. redness on sacrum (Active)  08/06/19 0844  Location: Sacrum  Location Orientation: Mid  Staging: Stage I -  Intact skin with non-blanchable redness of a localized area usually over a bony prominence.  Wound Description (Comments): redness on sacrum  Present on Admission: Yes   Radiology Studies: Dg Chest Port 1 View  Result Date: 08/11/2019 CLINICAL DATA:  Short of breath. EXAM: PORTABLE CHEST 1 VIEW COMPARISON:  08/10/2019 FINDINGS: Normal heart size. Marked chronic interstitial coarsening is identified bilaterally. Superimposed airspace opacities within the left midlung and left base appear unchanged. Mild patchy opacity within the periphery of the right upper lobe is unchanged. Similar appearance of small pleural effusions. IMPRESSION: 1. No change in aeration to the lungs compared with prior exam. 2. Stable patchy opacity within the periphery of the right upper lobe. 3. Stable small pleural effusions. Electronically Signed   By: Signa Kell M.D.   On: 08/11/2019 06:29    Scheduled Meds: . allopurinol  300 mg Oral Daily  . cefdinir  300 mg Oral Q12H  . cilostazol  100 mg Oral BID  . diazepam  2 mg Oral QPM  . famotidine  20 mg Oral BID  . guaiFENesin  600 mg Oral BID  . HYDROcodone-acetaminophen  1 tablet Oral Q4H  . ipratropium-albuterol  3  mL Nebulization QID  . mouth rinse  15 mL Mouth Rinse BID  . nebivolol  10 mg Oral Daily  . pantoprazole  40 mg Oral BID  . potassium chloride  40 mEq Oral BID  . simvastatin  40 mg Oral q1800  . warfarin  0.5 mg Oral ONCE-1800  . Warfarin - Pharmacist Dosing Inpatient   Does not apply q1800   Continuous Infusions: . sodium chloride      LOS: 6 days   Merlene Laughter, DO [facility] PAGER is on AMION  If 7PM-7AM, please contact night-coverage [email or web address] Password [facility] 08/12/2019, 5:24 PM

## 2019-08-12 NOTE — Progress Notes (Signed)
[place] for heparin/coumadin Indication: VTE treatment  Allergies  Allergen Reactions  . Codeine Hives and Other (See Comments)    Takes benadryl to stop reaction    Patient Measurements: Height: 6\' 1"  (185.4 cm) Weight: 196 lb 3.2 oz (89 kg) IBW/kg (Calculated) : 79.9  Vital Signs: Temp: 98.2 F (36.8 C) (10/25 0500) Temp Source: Axillary (10/25 0500) BP: 167/89 (10/25 0500) Pulse Rate: 110 (10/25 0500)  Labs: Recent Labs    08/10/19 0358 08/11/19 0454 08/12/19 0417  HGB 12.6* 13.0 13.3  HCT 39.1 39.4 40.6  PLT 182 186 223  LABPROT 26.7* 30.6* 33.1*  INR 2.5* 3.0* 3.3*  HEPARINUNFRC 0.47  --   --   CREATININE 0.84 0.69 0.67    Estimated Creatinine Clearance: 91.6 mL/min (by C-G formula based on SCr of 0.67 mg/dL).   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  Pharmacy consulted to dose coumadin.  PTA coumadin 4mg  po daily, last dose 10/18 at 1600.  08/12/2019 - INR = 3.3, slightly above goal - No bleeding issues noted per RN - CBC stable  - has 2 mg coumadin tabs at home: PTA dose 4 mg daily  Goal of Therapy:  INR 2-3   Plan:  Coumadin 0.5mg  po x 1 dose today If ready for DC rec: 1 mg qday ( 1/2 of his 2 mg tablets) with INR check Tuesday 10/27 Daily INR Monitor for s/sx of bleeding  Eudelia Bunch, Pharm.D [phone] 08/12/2019 10:26 AM

## 2019-08-13 ENCOUNTER — Inpatient Hospital Stay (HOSPITAL_COMMUNITY): Payer: Medicare Other

## 2019-08-13 LAB — URINALYSIS, ROUTINE W REFLEX MICROSCOPIC
Bilirubin Urine: NEGATIVE
Glucose, UA: NEGATIVE mg/dL
Hgb urine dipstick: NEGATIVE
Ketones, ur: 5 mg/dL — AB
Nitrite: NEGATIVE
Protein, ur: 30 mg/dL — AB
Specific Gravity, Urine: 1.017 (ref 1.005–1.030)
pH: 6 (ref 5.0–8.0)

## 2019-08-13 LAB — CBC WITH DIFFERENTIAL/PLATELET
Abs Immature Granulocytes: 1 10*3/uL — ABNORMAL HIGH (ref 0.00–0.07)
Band Neutrophils: 3 %
Basophils Absolute: 0 10*3/uL (ref 0.0–0.1)
Basophils Relative: 0 %
Eosinophils Absolute: 0.3 10*3/uL (ref 0.0–0.5)
Eosinophils Relative: 3 %
HCT: 40.1 % (ref 39.0–52.0)
Hemoglobin: 13 g/dL (ref 13.0–17.0)
Lymphocytes Relative: 7 %
Lymphs Abs: 0.7 10*3/uL (ref 0.7–4.0)
MCH: 30.2 pg (ref 26.0–34.0)
MCHC: 32.4 g/dL (ref 30.0–36.0)
MCV: 93 fL (ref 80.0–100.0)
Metamyelocytes Relative: 4 %
Monocytes Absolute: 0.6 10*3/uL (ref 0.1–1.0)
Monocytes Relative: 6 %
Myelocytes: 6 %
Neutro Abs: 7.4 10*3/uL (ref 1.7–7.7)
Neutrophils Relative %: 71 %
Platelets: 259 10*3/uL (ref 150–400)
RBC: 4.31 MIL/uL (ref 4.22–5.81)
RDW: 15.1 % (ref 11.5–15.5)
WBC: 10 10*3/uL (ref 4.0–10.5)
nRBC: 0 % (ref 0.0–0.2)

## 2019-08-13 LAB — COMPREHENSIVE METABOLIC PANEL
ALT: 14 U/L (ref 0–44)
AST: 21 U/L (ref 15–41)
Albumin: 3.1 g/dL — ABNORMAL LOW (ref 3.5–5.0)
Alkaline Phosphatase: 68 U/L (ref 38–126)
Anion gap: 12 (ref 5–15)
BUN: 18 mg/dL (ref 8–23)
CO2: 22 mmol/L (ref 22–32)
Calcium: 9.3 mg/dL (ref 8.9–10.3)
Chloride: 102 mmol/L (ref 98–111)
Creatinine, Ser: 0.82 mg/dL (ref 0.61–1.24)
GFR calc Af Amer: >60 mL/min (ref >60)
GFR calc non Af Amer: >60 mL/min (ref >60)
Glucose, Bld: 87 mg/dL (ref 70–99)
Potassium: 3.9 mmol/L (ref 3.5–5.1)
Sodium: 136 mmol/L (ref 135–145)
Total Bilirubin: 0.7 mg/dL (ref 0.3–1.2)
Total Protein: 6.6 g/dL (ref 6.5–8.1)

## 2019-08-13 LAB — PROTIME-INR
INR: 3 — ABNORMAL HIGH (ref 0.8–1.2)
Prothrombin Time: 30.3 seconds — ABNORMAL HIGH (ref 11.4–15.2)

## 2019-08-13 LAB — MAGNESIUM: Magnesium: 1.9 mg/dL (ref 1.7–2.4)

## 2019-08-13 LAB — PHOSPHORUS: Phosphorus: 3 mg/dL (ref 2.5–4.6)

## 2019-08-13 MED ORDER — WARFARIN SODIUM 2 MG PO TABS
2.0000 mg | ORAL_TABLET | Freq: Once | ORAL | Status: AC
  Administered 2019-08-13: 2 mg via ORAL
  Filled 2019-08-13: qty 1

## 2019-08-13 MED ORDER — VITAMIN B-12 1000 MCG PO TABS
1000.0000 ug | ORAL_TABLET | Freq: Every day | ORAL | Status: DC
  Administered 2019-08-13 – 2019-08-14 (×2): 1000 ug via ORAL
  Filled 2019-08-13 (×2): qty 1

## 2019-08-13 MED ORDER — IPRATROPIUM BROMIDE 0.02 % IN SOLN
0.5000 mg | Freq: Four times a day (QID) | RESPIRATORY_TRACT | Status: DC
  Administered 2019-08-13 – 2019-08-14 (×3): 0.5 mg via RESPIRATORY_TRACT
  Filled 2019-08-13 (×3): qty 2.5

## 2019-08-13 MED ORDER — LEVALBUTEROL HCL 0.63 MG/3ML IN NEBU
0.6300 mg | INHALATION_SOLUTION | Freq: Four times a day (QID) | RESPIRATORY_TRACT | Status: DC
  Administered 2019-08-13 – 2019-08-14 (×3): 0.63 mg via RESPIRATORY_TRACT
  Filled 2019-08-13 (×3): qty 3

## 2019-08-13 NOTE — Progress Notes (Signed)
OT Cancellation Note  Patient Details Name: KENSHIN SPLAWN MRN: [id number] DOB: 1945-10-25   Cancelled Treatment:    Reason Eval/Treat Not Completed: Other (comment) Pt eating dinner when OT presents for tx. Pt politely declines to participate at this time. Will f/u on next scheduled date for treatment as able.  Jake Church Jerimiah Wolman 08/13/2019, 5:54 PM

## 2019-08-13 NOTE — Progress Notes (Signed)
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  Patient reports increasing cough with shortness of breath congestion.  Complaint of left-sided chest pain worsened on deep breathing, subjective fevers and chills. In the ED he was in septic shock with hypoxic respiratory failure (O2 sats 77% on room air), fever 102.6 F, tachycardic, hypotensive, lactic acid of 2.8, WC of 15 .9K and AKI with creatinine 1.45.  Also had elevated troponin with no ischemic changes on EKG.  Chest x-ray showed patchy groundglass opacities in the left mid and lower lung lobe concerning for multifocal pneumonia.  Was placed on nonrebreather and started empiric antibiotic.  COVID-19 was tested negative. Admitted to stepdown unit for septic shock.  PCCM involved in care.    **Interim History A few nights ago he was hypotensive briefly requiring Levophed and was weaned off of it.  Blood pressures remained stable but he still complains of cough and left-sided chest pain on inspiration and felt it is a deep stabbing pain.  N longer on supplemental oxygen via nasal cannula and is improving slightly and is suctioning his has helped. HR has been improving. He is doing well on Abx and will transition to po. PT/OT recommending SNF but patient refusing and wanting to go home. Patient's Heparin gtt was now stopped.  Hospitalization has been complicated by GERD and reflux so we have added clonidine.  Patient is extremely adamant about going home and does not want to go to a skilled nursing facility at all.  Antibiotics have been changed to p.o. today and  respiratory status is stable and will adjust the patient to GERD and reflux.    The patient felt "swimmy headed" and lightheaded yesterday.  States that he did have some reflux this morning.  We will start IV fluid hydration again and continue to monitor clinical response now IV fluid has been discontinued.  Heart rate was remained elevated yesterday.  Today he complains of having some burning and discomfort in his urine.  Continues to end up back on oxygen somehow and has a nurse to wean him off oxygen and he is saturating well without oxygen.  Assessment & Plan:   Principal Problem:   Sepsis due to pneumonia [facility]) Active Problems:   DVT, lower extremity and pulmonary embolism, recurrent   Hyperlipidemia   Gout   COPD (chronic obstructive pulmonary disease) (HCC)   Multifocal pneumonia   AKI (acute kidney injury) (HCC)   Pressure injury of skin   Acute respiratory failure with hypoxia (HCC)   Demand ischemia (HCC)  Severe Sepsis with septic shock (HCC), improved  Acute respiratory failure with hypoxemia, improving  -Secondary to multifocal pneumonia.  Initially on NRB, transitioned to 6 L via nasal cannula and now on 2 Liters and Nurse weaned to Room Air and saturating in the 90's but has intermittently been on oxygen.  Also required pressors briefly during the night for hypotension.  Now stable. -Continued IV fluids but are now discontinued  -Continued Empiric Cefepime and changed to po Cefdinir. Discontinue IV Vancomycin.  Follow blood cultures and 1/2 Showed Staph Species and likely contaminant. -Repeated Blood Cx x2 and showed NGTD at 3  Days  -C/w Supportive care with antitussives and Vicodin for pain.  Continue as needed nebs -Wean oxygen as tolerated, incentive spirometry. -Continue stepdown monitoring.  WBC and subsequent lactic acid improving -Continue to Monitor for Infection  -CXR this AM showed "Patchy bilateral pneumonia that is stable. Normal heart size and stable  mediastinal contours. Background of chronic hyperinflation." -Check SLP Evaluation and he was placed on a Dysphagia 3 Diet with Thin Liquids -Continue to Wean O2 and now off of it -C/w Xopenex 0.63 mg Neb q2hprn Wheezing and SOB and added scheduled Xopenex/Atrovent -C/w DuoNeb 3 mL 4 times Daily and Guaifenesin 1200 mg po BID -C/w Flutter Valve and Incentive Spirometry  -Patient is improving and recommending up in Chair -PT/OT recommending SNF but patient refusing and does not have very much family support at home -Restarted IVF at a rate of 75 mL/hr as he was again Tachycardic but have now stopped today  Left-sided pleuritic chest pain, improved.  -Likely pleurisy from multifocal pneumonia.   -Bedside ultrasound was done showing small amount of pleural fluid without effusion or loculation. -Pain control with Vicodin and given a 1x dose of Morphine.  Empiric antibiotics, DuoNeb and Vicodin as needed for pain. -Will obtain CT of the chest if respiratory symptoms unimproved and has persistent pain but it is improved today so will not obtain   DVT, lower extremity and pulmonary embolism, recurrent -INR was subtherapeutic.  Markedly elevated D-dimer.  Given presentation of septic shock with multifocal pneumonia respiratory symptoms are likely due to infection rather than PE. -C/w Coumadin with Pharmacy to Dose and continued with IV heparin bridge until today. INR was 3.0 today  -Doppler lower extremity negative for DVT.  Elevated troponin/history of CAD -No EKG changes.  Troponin trending down and last Two Troponin was 59 and 69.  Cardiology consult appreciated and feel it is pleuritic.  Suspect demand ischemia. -Recent 2D echo 1 month back showing normal EF and impaired LV relaxation -Cardiology did not feel there is any acute cardiac concerns at this time and have signed off the case  Hyperlipidemia/PAD -Lipid panel was checked and showed a total cholesterol/HDL ratio of 2.6, cholesterol  level of 71, HDL 27, LDL 20, triglycerides 119, VLDL 24 -Continue statin and cilostazol  COPD (chronic obstructive pulmonary disease) (HCC) -Continue scheduled nebs.  Not in acute exacerbation -Continue Supplemental O2 via [place] and this has been weaned  -Started Xopenex and Atrovent  AKI (acute kidney injury) (HCC) Metabolic Acidosis -ATN secondary to septic shock.  Improving a.m. labs with fluids and pressors -Patient's CO2 is now 22 and AG is 12 and Chloride is 102 -Continue to Monitor and Trend Renal Fxn -Patient's BUN/Cr is now 18/0.82 -Continue to Monitor Renal Fxn and Repeat CMP in the AM  Leukocytosis -Improving. WBC went from 13.5 -> 9.3 -> 6.6 -> 7.1 -> 7.7 -> 9.8 -> 10.0 -C/w Abx as delineated as above -Continue to Monitor and trend -Procalcitonin is trending down  Normocytic Anemia B12 Deficiency  -Patient's hemoglobin/hematocrit went from 12.1/37.5 is now 13.0/40.1 -Checked Anemia Panel and showed iron level of 31, U IBC 140, TIBC 171, saturation ratios of 18%, ferritin level 567, folate of 15.0, and vitamin B12 level of 53 -Will start Niferex and Vitamin B12 Supplementation -Continue monitor for signs and symptoms of bleeding; currently no overt bleeding noted-repeat CBC in a.m.  Thrombocytopenia The patient's platelet count went from 180,000 and is now 259,000 -Continue monitor as patient is on a heparin drip and anticoagulated warfarin  Continue monitor for signs and symptoms of bleeding -If continues to drop further will need to consider HIT panel if clinically do not feel he has HIT -To monitor and trend and repeat CBC  Hypomagnesemia -Patient magnesium level was extremely low at 1.9 -Repeat magnesium level in the a.m.  Hypocalcemia, improved  -Patient's calcium level now 9.3 and adjusted for albumin is a little bit higher -Continue to monitor and trend and repeat CMP in the a.m.  HyperBilirubinemia -Mild at 1.4 -> 1.0 -> 1.3 -> 1.2 -> 0.7 -Continue to  Monitor and trend repeat CMP in a.m.  Hyponatremia -Improved is now 136 -NS at 75 mL/hr started yesterday now discontinued Continue monitor and trend -Repeat CMP in a.m.  GERD/Indigestion/Acid Reflux/Heartburn -Continue PPI 40 mg p.o. twice daily and Maalox -Will add Famotidine  -Avoid spicy food  Dizziness/Lightheadness -Felt lightheaded -Start NS at 75 mL/hr -Check Orthostatic Vital Signs -IF not improving will consider Head CT  Hemoptysis -Mild in the setting of PNA -Continue guaifenesin -Continue to Monitor while Anticoagulated -Repeat CXR in AM and continue to Monitor quantity    Urinary Burning and Discomfort -Check urinalysis and urine culture -Urinalysis showed clear appearance with trace leukocytes, 5 ketones, negative nitrites, rare bacteria, present mucus, 6-10 RBCs per high-power field, and 6-10 WBCs and urine cultures pending -Continue to monitor and may need some Pyridium  DVT prophylaxis: Coumadin  Code Status: FULL CODE Family Communication: No family present at bedside  Disposition Plan: Needs SNF but refusing. Anticipating D/C Home in the Next 24-48 hours   Consultants:   Cardiology  PCCM/Pulmonary    Procedures: None   Antimicrobials:  Anti-infectives (From admission, onward)   Start     Dose/Rate Route Frequency Ordered Stop   08/11/19 2200  amoxicillin-clavulanate (AUGMENTIN) 875-125 MG per tablet 1 tablet  Status:  Discontinued     1 tablet Oral Every 12 hours 08/11/19 1553 08/11/19 1555   08/11/19 2200  cefdinir (OMNICEF) capsule 300 mg     300 mg Oral Every 12 hours 08/11/19 1556     08/08/19 1400  ceFEPIme (MAXIPIME) 2 g in sodium chloride 0.9 % 100 mL IVPB  Status:  Discontinued     2 g 200 mL/hr over 30 Minutes Intravenous Every 8 hours 08/08/19 0753 08/11/19 1553   08/08/19 1200  vancomycin (VANCOCIN) IVPB 1000 mg/200 mL premix  Status:  Discontinued     1,000 mg 200 mL/hr over 60 Minutes Intravenous Every 12 hours 08/08/19 1053  08/09/19 0934   08/07/19 1600  vancomycin (VANCOCIN) 1,500 mg in sodium chloride 0.9 % 500 mL IVPB  Status:  Discontinued     1,500 mg 250 mL/hr over 120 Minutes Intravenous Every 24 hours 08/06/19 1054 08/08/19 1053   08/06/19 1800  ceFEPIme (MAXIPIME) 2 g in sodium chloride 0.9 % 100 mL IVPB  Status:  Discontinued     2 g 200 mL/hr over 30 Minutes Intravenous Every 12 hours 08/06/19 0938 08/08/19 0753   08/06/19 1000  ceFEPIme (MAXIPIME) 2 g in sodium chloride 0.9 % 100 mL IVPB  Status:  Discontinued     2 g 200 mL/hr over 30 Minutes Intravenous Every 12 hours 08/06/19 0837 08/06/19 0938   08/06/19 0900  vancomycin (VANCOCIN) 1,500 mg in sodium chloride 0.9 % 500 mL IVPB     1,500 mg 250 mL/hr over 120 Minutes Intravenous  Once 08/06/19 0832 08/06/19 1137   08/06/19 0800  cefTRIAXone (ROCEPHIN) 2 g in sodium chloride 0.9 % 100 mL  IVPB  Status:  Discontinued     2 g 200 mL/hr over 30 Minutes Intravenous Every 24 hours 08/06/19 0623 08/06/19 0816   08/06/19 0630  azithromycin (ZITHROMAX) 500 mg in sodium chloride 0.9 % 250 mL IVPB  Status:  Discontinued     500 mg 250 mL/hr over 60 Minutes Intravenous Daily 08/06/19 0623 08/06/19 0816   08/06/19 0530  vancomycin (VANCOCIN) 2,000 mg in sodium chloride 0.9 % 500 mL IVPB     2,000 mg 250 mL/hr over 120 Minutes Intravenous  Once 08/06/19 0448 08/06/19 0735   08/06/19 0500  ceFEPIme (MAXIPIME) 2 g in sodium chloride 0.9 % 100 mL IVPB     2 g 200 mL/hr over 30 Minutes Intravenous NOW 08/06/19 0447 08/06/19 0536     Subjective: Seen and examined at bedside states he was doing okay but had some burning discomfort in his urine.  No chest pain, lightheadedness or dizziness but still felt weak and states that he tried to sit in a chair for 2 hours.  No other concerns or points at this time but did want to tell me that he keeps getting put back on oxygen and I asked the nurse if he desaturated and she said no and so I have asked him to wean his  oxygen again and started scheduled breathing treatments.  Objective: Vitals:   08/13/19 0856 08/13/19 1129 08/13/19 1200 08/13/19 1359  BP:    127/74  Pulse:    95  Resp:    16  Temp:    98.2 F (36.8 C)  TempSrc:    Oral  SpO2: 94% (!) 88% 99% (!) 89%  Weight:      Height:        Intake/Output Summary (Last 24 hours) at 08/13/2019 1944 Last data filed at 08/13/2019 0254 Gross per 24 hour  Intake 1836.28 ml  Output 925 ml  Net 911.28 ml   Filed Weights   08/10/19 0622 08/11/19 0526 08/13/19 0500  Weight: 90.4 kg 89 kg 86.9 kg   Examination: Physical Exam:  Constitutional: WN/WD overweight disheveled Caucasian male appears slightly uncomfortable but does not appear to be in any acute distress Eyes: Lids and conjunctivae normal, sclerae anicteric  ENMT: External Ears, Nose appear normal. Grossly normal hearing. Mucous membranes are moist.  Neck: Appears normal, supple, no cervical masses, normal ROM, no appreciable thyromegaly; no  JVD Respiratory: Diminished to auscultation bilaterally, no wheezing, rales, rhonchi or crackles. Normal respiratory effort and patient is not tachypenic. No accessory muscle use.  Was again wearing supplemental oxygen via nasal cannula when nursing home and he is well with this morning Cardiovascular: RRR, no murmurs / rubs / gallops. S1 and S2 auscultated. No extremity edema.  Abdomen: Soft, non-tender, Distended. Bowel sounds positive x4.  GU: Deferred. Musculoskeletal: No clubbing / cyanosis of digits/nails. No joint deformity upper and lower extremities. Skin: No rashes, lesions, ulcers on a limited skin evaluation. No induration; Warm and dry.  Neurologic: CN 2-12 grossly intact with no focal deficits. Romberg sign  And cerebellar reflexes not assessed.  Psychiatric: Normal judgment and insight. Alert and oriented x 3. Anxious mood and appropriate affect.   Data Reviewed: I have personally reviewed following labs and imaging  studies  CBC: Recent Labs  Lab 08/09/19 0236 08/10/19 0358 08/11/19 0454 08/12/19 0417 08/13/19 0403  WBC 6.6 7.1 7.7 9.8 10.0  NEUTROABS 5.5 5.7 5.5 6.7 7.4  HGB 11.1* 12.6* 13.0 13.3 13.0  HCT 34.0* 39.1 39.4 40.6  40.1  MCV 95.2 94.9 92.5 92.9 93.0  PLT 154 182 186 223 259   Basic Metabolic Panel: Recent Labs  Lab 08/09/19 0236 08/10/19 0358 08/11/19 0454 08/12/19 0417 08/13/19 0403  NA 135 134* 134* 135 136  K 3.9 3.9 3.6 3.4* 3.9  CL 105 103 102 101 102  CO2 19* 19* 20* 22 22  GLUCOSE 108* 97 99 98 87  BUN CREATININE 0.84 0.84 0.69 0.67 0.82  CALCIUM 8.2* 9.0 9.2 9.2 9.3  MG 1.5* 1.6* 1.6* 1.7 1.9  PHOS 3.0 3.0 3.2 3.2 3.0   GFR: Estimated Creatinine Clearance: 89.3 mL/min (by C-G formula based on SCr of 0.82 mg/dL). Liver Function Tests: Recent Labs  Lab 08/09/19 0236 08/10/19 0358 08/11/19 0454 08/12/19 0417 08/13/19 0403  AST 11* 14* 14* 16 21  ALT ALKPHOS 57 68 69 71 68  BILITOT 1.4* 1.0 1.3* 1.2 0.7  PROT 5.8* 6.4* 6.7 6.8 6.6  ALBUMIN 2.6* 2.8* 3.0* 3.0* 3.1*   No results for input(s): LIPASE, AMYLASE in the last 168 hours. No results for input(s): AMMONIA in the last 168 hours. Coagulation Profile: Recent Labs  Lab 08/09/19 0236 08/10/19 0358 08/11/19 0454 08/12/19 0417 08/13/19 0403  INR 1.9* 2.5* 3.0* 3.3* 3.0*   Cardiac Enzymes: No results for input(s): CKTOTAL, CKMB, CKMBINDEX, TROPONINI in the last 168 hours. BNP (last 3 results) No results for input(s): PROBNP in the last 8760 hours. HbA1C: No results for input(s): HGBA1C in the last 72 hours. CBG: No results for input(s): GLUCAP in the last 168 hours. Lipid Profile: No results for input(s): CHOL, HDL, LDLCALC, TRIG, CHOLHDL, LDLDIRECT in the last 72 hours. Thyroid Function Tests: No results for input(s): TSH, T4TOTAL, FREET4, T3FREE, THYROIDAB in the last 72 hours. Anemia Panel: No results for input(s): VITAMINB12, FOLATE, FERRITIN, TIBC,  IRON, RETICCTPCT in the last 72 hours. Sepsis Labs: Recent Labs  Lab 08/07/19 0522 08/08/19 0156  PROCALCITON 9.91 5.20    Recent Results (from the past 240 hour(s))  SARS Coronavirus 2 by RT PCR (hospital order, performed in [facility] hospital lab) Nasopharyngeal Nasopharyngeal Swab     Status: None   Collection Time: 08/06/19  4:09 AM   Specimen: Nasopharyngeal Swab  Result Value Ref Range Status   SARS Coronavirus 2 NEGATIVE NEGATIVE Final    Comment: (NOTE) If result is NEGATIVE SARS-CoV-2 target nucleic acids are NOT DETECTED. The SARS-CoV-2 RNA is generally detectable in upper and lower  respiratory specimens during the acute phase of infection. The lowest  concentration of SARS-CoV-2 viral copies this assay can detect is 250  copies / mL. A negative result does not preclude SARS-CoV-2 infection  and should not be used as the sole basis for treatment or other  patient management decisions.  A negative result may occur with  improper specimen collection / handling, submission of specimen other  than nasopharyngeal swab, presence of viral mutation(s) within the  areas targeted by this assay, and inadequate number of viral copies  (<250 copies / mL). A negative result must be combined with clinical  observations, patient history, and epidemiological information. If result is POSITIVE SARS-CoV-2 target nucleic acids are DETECTED. The SARS-CoV-2 RNA is generally detectable in upper and lower  respiratory specimens dur ing the acute phase of infection.  Positive  results are indicative of active infection with SARS-CoV-2.  Clinical  correlation with patient history and other diagnostic information is  necessary to  determine patient infection status.  Positive results do  not rule out bacterial infection or co-infection with other viruses. If result is PRESUMPTIVE POSTIVE SARS-CoV-2 nucleic acids MAY BE PRESENT.   A presumptive positive result was obtained on the submitted  specimen  and confirmed on repeat testing.  While 2019 novel coronavirus  (SARS-CoV-2) nucleic acids may be present in the submitted sample  additional confirmatory testing may be necessary for epidemiological  and / or clinical management purposes  to differentiate between  SARS-CoV-2 and other Sarbecovirus currently known to infect humans.  If clinically indicated additional testing with an alternate test  methodology [phone]([id number]) is advised. The SARS-CoV-2 RNA is generally  detectable in upper and lower respiratory sp ecimens during the acute  phase of infection. The expected result is Negative. Fact Sheet for Patients:  BoilerBrush.com.cyhttps://www.fda.gov/media/136312/download Fact Sheet for Healthcare Providers: https://pope.com/https://www.fda.gov/media/136313/download This test is not yet approved or cleared by the [place] FDA and has been authorized for detection and/or diagnosis of SARS-CoV-2 by FDA under an Emergency Use Authorization (EUA).  This EUA will remain in effect (meaning this test can be used) for the duration of the COVID-19 declaration under Section 564(b)(1) of the Act, 21 U.S.C. section 360bbb-3(b)(1), unless the authorization is terminated or revoked sooner. Performed at [facility], [address] [place], [place] [address]   Blood culture (routine x 2)     Status: None   Collection Time: 08/06/19  4:30 AM   Specimen: BLOOD RIGHT HAND  Result Value Ref Range Status   Specimen Description BLOOD RIGHT HAND  Final   Special Requests   Final    BOTTLES DRAWN AEROBIC AND ANAEROBIC Blood Culture adequate volume   Culture   Final    NO GROWTH 5 DAYS Performed at [facility], [address] [place], [place] [address]    Report Status 08/11/2019 FINAL  Final  Blood culture (routine x 2)     Status: Abnormal   Collection Time: 08/06/19  4:30 AM   Specimen: BLOOD  Result Value Ref Range Status   Specimen Description BLOOD RIGHT ANTECUBITAL  Final   Special  Requests   Final    BOTTLES DRAWN AEROBIC AND ANAEROBIC Blood Culture adequate volume   Culture  Setup Time   Final    GRAM POSITIVE COCCI CRITICAL RESULT CALLED TO, READ BACK BY AND VERIFIED WITH: J GRIMSLEY PHARMD 08/07/19 0141 JDW IN BOTH AEROBIC AND ANAEROBIC BOTTLES    Culture (A)  Final    STAPHYLOCOCCUS SPECIES (COAGULASE NEGATIVE) THE SIGNIFICANCE OF ISOLATING THIS ORGANISM FROM A SINGLE SET OF BLOOD CULTURES WHEN MULTIPLE SETS ARE DRAWN IS UNCERTAIN. PLEASE NOTIFY THE MICROBIOLOGY DEPARTMENT WITHIN ONE WEEK IF SPECIATION AND SENSITIVITIES ARE REQUIRED. Performed at [facility], [address] [place], [place] [address]    Report Status 08/09/2019 FINAL  Final  Culture, sputum-assessment     Status: None   Collection Time: 08/06/19  6:23 AM   Specimen: Sputum  Result Value Ref Range Status   Specimen Description SPUTUM  Final   Special Requests NONE  Final   Sputum evaluation   Final    THIS SPECIMEN IS ACCEPTABLE FOR SPUTUM CULTURE Performed at [facility], [address] [place], [place] [address]    Report Status 08/07/2019 FINAL  Final  Culture, respiratory     Status: None   Collection Time: 08/06/19  6:23 AM   Specimen: SPU  Result Value Ref Range Status  Specimen Description   Final    SPUTUM Performed at [facility], [address], [place], [place] [address]    Special Requests   Final    NONE Reflexed from [id number] Performed at [facility], [address] [place], [place] [address]    Gram Stain   Final    ABUNDANT WBC PRESENT,BOTH PMN AND MONONUCLEAR FEW GRAM POSITIVE COCCI FEW GRAM POSITIVE RODS Performed at [facility], [address] [place], [place] [address]    Culture FEW ESCHERICHIA COLI  Final   Report Status 08/10/2019 FINAL  Final   Organism ID, Bacteria ESCHERICHIA COLI  Final      Susceptibility   Escherichia coli - MIC*    AMPICILLIN 8 SENSITIVE Sensitive      CEFAZOLIN <=4 SENSITIVE Sensitive     CEFEPIME <=1 SENSITIVE Sensitive     CEFTAZIDIME <=1 SENSITIVE Sensitive     CEFTRIAXONE <=1 SENSITIVE Sensitive     CIPROFLOXACIN >=4 RESISTANT Resistant     GENTAMICIN <=1 SENSITIVE Sensitive     IMIPENEM <=0.25 SENSITIVE Sensitive     TRIMETH/SULFA <=20 SENSITIVE Sensitive     AMPICILLIN/SULBACTAM 4 SENSITIVE Sensitive     PIP/TAZO <=4 SENSITIVE Sensitive     Extended ESBL NEGATIVE Sensitive     * FEW ESCHERICHIA COLI  Culture, Urine     Status: Abnormal   Collection Time: 08/06/19  8:36 AM   Specimen: Urine, Clean Catch  Result Value Ref Range Status   Specimen Description   Final    URINE, CLEAN CATCH Performed at [facility], [address], [place], [place] [address]    Special Requests   Final    NONE Performed at [facility], [address], [place], [place] [address]    Culture (A)  Final    <10,000 COLONIES/mL INSIGNIFICANT GROWTH Performed at [facility], [address], [place], [place] [address]    Report Status 08/07/2019 FINAL  Final  MRSA PCR Screening     Status: None   Collection Time: 08/07/19  9:30 PM   Specimen: Nasal Mucosa; Nasopharyngeal  Result Value Ref Range Status   MRSA by PCR NEGATIVE NEGATIVE Final    Comment:        The GeneXpert MRSA Assay (FDA approved for NASAL specimens only), is one component of a comprehensive MRSA colonization surveillance program. It is not intended to diagnose MRSA infection nor to guide or monitor treatment for MRSA infections. Performed at [facility], [address], [place], [place] [address]   Culture, blood (routine x 2)     Status: None (Preliminary result)   Collection Time: 08/10/19  4:44 PM   Specimen: BLOOD  Result Value Ref Range Status   Specimen Description   Final    BLOOD RIGHT ANTECUBITAL Performed at [facility], [address] [place], [place] [address]     Special Requests   Final    BOTTLES DRAWN AEROBIC AND ANAEROBIC Blood Culture adequate volume Performed at [facility], [address], [place], [place] [address]    Culture   Final    NO GROWTH 3 DAYS Performed at [facility], [address] [place], [place] [address]    Report Status PENDING  Incomplete  Culture, blood (routine x 2)     Status: None (Preliminary result)   Collection Time: 08/10/19  4:49 PM   Specimen: BLOOD  Result Value Ref Range Status   Specimen Description   Final    BLOOD RIGHT ARM Performed at [facility], [name], [place], [place] [address]    Special Requests   Final    BOTTLES DRAWN AEROBIC AND ANAEROBIC Blood Culture adequate volume Performed at [facility], [address] [place], [place] [address]    Culture   Final    NO GROWTH 3 DAYS Performed at [facility], [address], [place], [place] [address]    Report Status PENDING  Incomplete     RN Pressure Injury Documentation: Pressure Injury 08/06/19 Sacrum Mid Stage I -  Intact skin with non-blanchable redness of a localized area usually over a bony prominence. redness on sacrum (Active)  08/06/19 0844  Location: Sacrum  Location Orientation: Mid  Staging: Stage I -  Intact skin with non-blanchable redness of a localized area usually over a bony prominence.  Wound Description (Comments): redness on sacrum  Present on Admission: Yes   Radiology Studies: Dg Chest Port 1 View  Result Date: 08/13/2019 CLINICAL DATA:  Shortness of breath EXAM: PORTABLE CHEST 1 VIEW COMPARISON:  Two days ago FINDINGS: Patchy bilateral pneumonia that is stable. Normal heart size and stable mediastinal contours. Background of chronic hyperinflation. IMPRESSION: Stable bilateral pneumonia. Electronically Signed   By: Marnee Spring M.D.   On: 08/13/2019 07:05    Scheduled Meds:  allopurinol  300 mg Oral Daily   cefdinir  300 mg  Oral Q12H   cilostazol  100 mg Oral BID   cyanocobalamin  1,000 mcg Intramuscular Once   diazepam  2 mg Oral QPM   famotidine  20 mg Oral BID   guaiFENesin  600 mg Oral BID   HYDROcodone-acetaminophen  1 tablet Oral Q4H   ipratropium  0.5 mg Nebulization Q6H   iron polysaccharides  150 mg Oral Daily   levalbuterol  0.63 mg Nebulization Q6H   mouth rinse  15 mL Mouth Rinse BID   nebivolol  10 mg Oral Daily   pantoprazole  40 mg Oral BID   simvastatin  40 mg Oral q1800   Warfarin - Pharmacist Dosing Inpatient   Does not apply q1800   Continuous Infusions:   LOS: 7 days   Merlene Laughter, DO [facility] PAGER is on AMION  If 7PM-7AM, please contact night-coverage [email or web address] Password [email or web address] 08/13/2019, 7:44 PM

## 2019-08-13 NOTE — Progress Notes (Addendum)
[place] for warfarin Indication: VTE treatment  Allergies  Allergen Reactions  . Codeine Hives and Other (See Comments)    Takes benadryl to stop reaction    Patient Measurements: Height: 6\' 1"  (185.4 cm) Weight: 191 lb 9.3 oz (86.9 kg) IBW/kg (Calculated) : 79.9  Vital Signs: Temp: 97.9 F (36.6 C) (10/26 0606) Temp Source: Oral (10/25 2207) BP: 155/67 (10/26 0606) Pulse Rate: 97 (10/26 0606)  Labs: Recent Labs    08/11/19 0454 08/12/19 0417 08/13/19 0403  HGB 13.0 13.3 13.0  HCT 39.4 40.6 40.1  PLT 186 223 259  LABPROT 30.6* 33.1* 30.3*  INR 3.0* 3.3* 3.0*  CREATININE 0.69 0.67 0.82    Estimated Creatinine Clearance: 89.3 mL/min (by C-G formula based on SCr of 0.82 mg/dL).   Assessment: Pt is a 79 YOM presenting with c/o SOB, cough and congestion. Pt has a hx of PAD, AAA, bilateral renal artery stenosis and HTN. Pt found to have multifocal pneumonia. Pharmacy to dose warfarin.   Baseline INR subtherapeutic  Prior anticoagulation: warfarin 4mg  PO daily  Significant events:  Today, 08/13/2019:  CBC: WNL and steady  INR therapeutic at 3, borderline high. Previously rising, but appears to have peaked at 3.3.  Major drug interactions: cilostazol (taking PTA w/ warfarin)  No bleeding issues per nursing  Eating 50-100% of meals  Goal of Therapy: INR 2-3  Plan:  Warfarin 2mg  PO x 1  Daily INR  CBC at least q72 hr while on warfarin  Monitor for signs of bleeding or thrombosis  Natale Lay, PharmD Candidate 08/13/2019,9:07 AM

## 2019-08-13 NOTE — Progress Notes (Signed)
Physical Therapy Treatment Patient Details Name: Tony Zhang MRN: [id number] DOB: 11/27/1945 Today's Date: 08/13/2019    History of Present Illness 73 yo male admitted to ED on 10/19 for PNA sespis. Pt with elevated troponin, now downtrending with cardiology documenting potential demand ischemia due to sepsis. PMH includes COPD, dyslipidemia, HTN, PVD, AAA, history of DVT and PE, bilateral renal artery stenosis, lumbar surgery.    PT Comments    Pt assisted with ambulating (remained in room per pt preference).  Pt improving with mobility and encouraged to remain up in recliner.  Pt would still benefit from SNF upon d/c however would like to return home.     Follow Up Recommendations  SNF;Supervision for mobility/OOB     Equipment Recommendations  Rolling walker with 5" wheels    Recommendations for Other Services       Precautions / Restrictions Precautions Precautions: Fall Precaution Comments: monitor sats Restrictions Weight Bearing Restrictions: No    Mobility  Bed Mobility Overal bed mobility: Modified Independent       Supine to sit: HOB elevated        Transfers Overall transfer level: Needs assistance Equipment used: None Transfers: Sit to/from Stand Sit to Stand: Min guard         General transfer comment: pt continues to refuse RW, min/guard for safety  Ambulation/Gait Ambulation/Gait assistance: Min guard Gait Distance (Feet): 36 Feet Assistive device: IV Pole Gait Pattern/deviations: Step-through pattern;Decreased stride length;Wide base of support     General Gait Details: utilized IV pole for support, min/guard for safety, maintained 2L O2 [place] and SPO2 96% with ambulating   Stairs             Wheelchair Mobility    Modified Rankin (Stroke Patients Only)       Balance Overall balance assessment: Needs assistance;History of Falls         Standing balance support: Single extremity supported Standing balance-Leahy Scale:  Poor                              Cognition Arousal/Alertness: Awake/alert Behavior During Therapy: WFL for tasks assessed/performed Overall Cognitive Status: Within Functional Limits for tasks assessed                                        Exercises      General Comments        Pertinent Vitals/Pain Pain Assessment: 0-10 Pain Score: 7  Pain Location: left flank Pain Descriptors / Indicators: Sore;Discomfort Pain Intervention(s): Monitored during session;Patient requesting pain meds-RN notified;Repositioned    Home Living                      Prior Function            PT Goals (current goals can now be found in the care plan section) Progress towards PT goals: Progressing toward goals    Frequency    Min 3X/week      PT Plan Current plan remains appropriate    Co-evaluation              AM-PAC PT "6 Clicks" Mobility   Outcome Measure  Help needed turning from your back to your side while in a flat bed without using bedrails?: None Help needed moving from lying on your back to sitting on  the side of a flat bed without using bedrails?: None Help needed moving to and from a bed to a chair (including a wheelchair)?: A Little Help needed standing up from a chair using your arms (e.g., wheelchair or bedside chair)?: A Little Help needed to walk in hospital room?: A Little Help needed climbing 3-5 steps with a railing? : A Lot 6 Click Score: 19    End of Session Equipment Utilized During Treatment: Gait belt;Oxygen Activity Tolerance: Patient tolerated treatment well;Patient limited by fatigue Patient left: in chair;with chair alarm set;with call bell/phone within reach Nurse Communication: Mobility status PT Visit Diagnosis: Muscle weakness (generalized) (M62.81);Difficulty in walking, not elsewhere classified (R26.2)     Time: 2440-1027 PT Time Calculation (min) (ACUTE ONLY): 20 min  Charges:  $Gait Training:  8-22 mins                     Carmelia Bake, PT, DPT Acute Rehabilitation Services Office: [phone] Pager: [phone]  Trena Platt 08/13/2019, 12:30 PM

## 2019-08-13 NOTE — Care Management Important Message (Signed)
Important Message  Patient Details IM Letter given to Rhea Pink SW to present to the Patient Name: Tony Zhang MRN: [id number] Date of Birth: 23-Jul-1946   Medicare Important Message Given:  Yes     Kerin Salen 08/13/2019, 11:18 AM

## 2019-08-14 DIAGNOSIS — E785 Hyperlipidemia, unspecified: Secondary | ICD-10-CM

## 2019-08-14 LAB — COMPREHENSIVE METABOLIC PANEL
ALT: 15 U/L (ref 0–44)
AST: 23 U/L (ref 15–41)
Albumin: 3.2 g/dL — ABNORMAL LOW (ref 3.5–5.0)
Alkaline Phosphatase: 67 U/L (ref 38–126)
Anion gap: 11 (ref 5–15)
BUN: 18 mg/dL (ref 8–23)
CO2: 21 mmol/L — ABNORMAL LOW (ref 22–32)
Calcium: 9.4 mg/dL (ref 8.9–10.3)
Chloride: 103 mmol/L (ref 98–111)
Creatinine, Ser: 0.88 mg/dL (ref 0.61–1.24)
GFR calc Af Amer: >60 mL/min (ref >60)
GFR calc non Af Amer: >60 mL/min (ref >60)
Glucose, Bld: 90 mg/dL (ref 70–99)
Potassium: 4.2 mmol/L (ref 3.5–5.1)
Sodium: 135 mmol/L (ref 135–145)
Total Bilirubin: 0.8 mg/dL (ref 0.3–1.2)
Total Protein: 7 g/dL (ref 6.5–8.1)

## 2019-08-14 LAB — CBC WITH DIFFERENTIAL/PLATELET
Abs Immature Granulocytes: 1.04 10*3/uL — ABNORMAL HIGH (ref 0.00–0.07)
Basophils Absolute: 0.1 10*3/uL (ref 0.0–0.1)
Basophils Relative: 1 %
Eosinophils Absolute: 0.7 10*3/uL — ABNORMAL HIGH (ref 0.0–0.5)
Eosinophils Relative: 6 %
HCT: 41.4 % (ref 39.0–52.0)
Hemoglobin: 13.3 g/dL (ref 13.0–17.0)
Immature Granulocytes: 9 %
Lymphocytes Relative: 8 %
Lymphs Abs: 0.9 10*3/uL (ref 0.7–4.0)
MCH: 30.2 pg (ref 26.0–34.0)
MCHC: 32.1 g/dL (ref 30.0–36.0)
MCV: 93.9 fL (ref 80.0–100.0)
Monocytes Absolute: 0.7 10*3/uL (ref 0.1–1.0)
Monocytes Relative: 7 %
Neutro Abs: 7.8 10*3/uL — ABNORMAL HIGH (ref 1.7–7.7)
Neutrophils Relative %: 69 %
Platelets: 276 10*3/uL (ref 150–400)
RBC: 4.41 MIL/uL (ref 4.22–5.81)
RDW: 15.4 % (ref 11.5–15.5)
WBC: 11.3 10*3/uL — ABNORMAL HIGH (ref 4.0–10.5)
nRBC: 0 % (ref 0.0–0.2)

## 2019-08-14 LAB — PHOSPHORUS: Phosphorus: 3.5 mg/dL (ref 2.5–4.6)

## 2019-08-14 LAB — MAGNESIUM: Magnesium: 1.6 mg/dL — ABNORMAL LOW (ref 1.7–2.4)

## 2019-08-14 LAB — PROCALCITONIN: Procalcitonin: 0.21 ng/mL

## 2019-08-14 LAB — URINE CULTURE: Culture: NO GROWTH

## 2019-08-14 LAB — PROTIME-INR
INR: 2.3 — ABNORMAL HIGH (ref 0.8–1.2)
Prothrombin Time: 25.1 seconds — ABNORMAL HIGH (ref 11.4–15.2)

## 2019-08-14 MED ORDER — GUAIFENESIN ER 600 MG PO TB12: 600.0000 mg | ORAL_TABLET | Freq: Two times a day (BID) | ORAL | 0 refills | Status: DC

## 2019-08-14 MED ORDER — SENNOSIDES-DOCUSATE SODIUM 8.6-50 MG PO TABS: 1.0000 | ORAL_TABLET | Freq: Every evening | ORAL | 0 refills | Status: DC | PRN

## 2019-08-14 MED ORDER — FAMOTIDINE 20 MG PO TABS: 20.0000 mg | ORAL_TABLET | Freq: Every day | ORAL | 0 refills | Status: DC

## 2019-08-14 MED ORDER — IPRATROPIUM-ALBUTEROL 0.5-2.5 (3) MG/3ML IN SOLN: 3.0000 mL | Freq: Four times a day (QID) | RESPIRATORY_TRACT | 0 refills | Status: DC | PRN

## 2019-08-14 MED ORDER — POLYSACCHARIDE IRON COMPLEX 150 MG PO CAPS: 150.0000 mg | ORAL_CAPSULE | Freq: Every day | ORAL | 0 refills | Status: DC

## 2019-08-14 MED ORDER — MAGNESIUM SULFATE 2 GM/50ML IV SOLN
2.0000 g | Freq: Once | INTRAVENOUS | Status: AC
  Administered 2019-08-14: 2 g via INTRAVENOUS
  Filled 2019-08-14: qty 50

## 2019-08-14 MED ORDER — ONDANSETRON HCL 4 MG PO TABS: 4.0000 mg | ORAL_TABLET | Freq: Four times a day (QID) | ORAL | 0 refills | Status: DC | PRN

## 2019-08-14 MED ORDER — IPRATROPIUM-ALBUTEROL 0.5-2.5 (3) MG/3ML IN SOLN: 3.0000 mL | RESPIRATORY_TRACT | Status: DC | PRN

## 2019-08-14 MED ORDER — PHENAZOPYRIDINE HCL 100 MG PO TABS
100.0000 mg | ORAL_TABLET | Freq: Three times a day (TID) | ORAL | Status: DC
  Filled 2019-08-14 (×2): qty 1

## 2019-08-14 MED ORDER — PHENAZOPYRIDINE HCL 100 MG PO TABS: 100.0000 mg | ORAL_TABLET | Freq: Three times a day (TID) | ORAL | 0 refills | Status: DC

## 2019-08-14 MED ORDER — CEFDINIR 300 MG PO CAPS: 300.0000 mg | ORAL_CAPSULE | Freq: Two times a day (BID) | ORAL | 0 refills | Status: AC

## 2019-08-14 MED ORDER — CYANOCOBALAMIN 1000 MCG PO TABS: 1000.0000 ug | ORAL_TABLET | Freq: Every day | ORAL | 0 refills | Status: DC

## 2019-08-14 MED ORDER — WARFARIN SODIUM 4 MG PO TABS
4.0000 mg | ORAL_TABLET | Freq: Once | ORAL | Status: DC
  Filled 2019-08-14: qty 1

## 2019-08-14 NOTE — Progress Notes (Signed)
SATURATION QUALIFICATIONS: (This note is used to comply with regulatory documentation for home oxygen)  Patient Saturations on Room Air at Rest = 95%  Patient Saturations on Room Air while Ambulating = 99% 

## 2019-08-14 NOTE — Plan of Care (Signed)
  Problem: Health Behavior/Discharge Planning: Goal: Ability to manage health-related needs will improve 08/14/2019 0209 by Ashley Murrain, RN Outcome: Progressing 08/14/2019 0208 by Ashley Murrain, RN Outcome: Progressing   Problem: Clinical Measurements: Goal: Ability to maintain clinical measurements within normal limits will improve 08/14/2019 0209 by Ashley Murrain, RN Outcome: Progressing 08/14/2019 0208 by Ashley Murrain, RN Outcome: Progressing Goal: Will remain free from infection 08/14/2019 0209 by Ashley Murrain, RN Outcome: Progressing 08/14/2019 0208 by Ashley Murrain, RN Outcome: Progressing Goal: Diagnostic test results will improve 08/14/2019 0209 by Ashley Murrain, RN Outcome: Progressing 08/14/2019 0208 by Ashley Murrain, RN Outcome: Progressing Goal: Respiratory complications will improve 08/14/2019 0209 by Ashley Murrain, RN Outcome: Progressing 08/14/2019 0208 by Ashley Murrain, RN Outcome: Progressing Goal: Cardiovascular complication will be avoided Outcome: Progressing

## 2019-08-14 NOTE — Discharge Summary (Signed)
Physician Discharge Summary  MY MADARIAGA AVW:[id number] DOB: 23-Jan-1946 DOA: 08/06/2019  PCP: [name], MD  Admit date: 08/06/2019 Discharge date: 08/14/2019  Admitted From: Home Disposition: Home; Refused SNF and Home Health   Recommendations for Outpatient Follow-up:  1. Follow up with PCP in 1-2 weeks 2. Follow-up with pulmonology within 1 to 2 weeks 3. Follow-up with cardiology in outpatient setting 4. Please obtain CMP/CBC, Mag, Phos in one week 5. 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 Patient reports increasing cough with shortness of breath congestion. Complaint of left-sided chest pain worsened on deep breathing, subjective fevers and chills. In the ED he was in septic shock with hypoxic respiratory failure (O2 sats 77% on room air), fever 102.6 F, tachycardic, hypotensive, lactic acid of 2.8, WC of 15.9K and AKI with creatinine 1.45. Also had elevated troponin with no ischemic changes on EKG. Chest x-ray showed patchy groundglass opacities in the left mid and lower lung lobe concerning for multifocal pneumonia. Was placed on nonrebreather and started empiric antibiotic. COVID-19 was tested negative. Admitted to stepdown unit for septic shock. PCCM involved in care.   **Interim History A few nights ago he was hypotensive briefly requiring Levophed and was weaned off of it.  Blood pressures remained stable but he still complains of cough  and left-sided chest pain on inspiration and felt it is a deep stabbing pain.  N longer on supplemental oxygen via nasal cannula and is improving slightly and is suctioning his has helped. HR has been improving. He is doing well on Abx and will transition to po. PT/OT recommending SNF but patient refusing and wanting to go home. Patient's Heparin gtt was now stopped.  Hospitalization has been complicated by GERD and reflux so we have added clonidine.  Patient is extremely adamant about going home and does not want to go to a skilled nursing facility at all.  Antibiotics have been changed to p.o. today and respiratory status is stable and will adjust the patient to GERD and reflux.    The patient felt "swimmy headed" and lightheaded  a day before yesterday.  States that he did have some reflux this morning.  We will start IV fluid hydration again and continue to monitor clinical response now IV fluid has been discontinued.  Heart rate was remained elevated yesterday.    Yesterday he complains of having some burning and discomfort in his urine and continues to complain of this but his urinalysis is unremarkable and his urine culture is negative so we will start the patient Pyridium for 3 days.  Continues to end up back on oxygen somehow and has a nurse to wean him off oxygen and he is saturating well without oxygen.  Ambulated today with nursing assistant and did not require oxygen until he is weaned off from.  He is deemed stable to be discharged however he did not want to go to skilled nursing facility and refused home health will be going home.  He understands he will need to follow-up with PCP within 1 to [redacted] weeks along  with cardiology and pulmonary.  Discharge Diagnoses:  Principal Problem:   Sepsis due to pneumonia [facility]) Active Problems:   DVT, lower extremity and pulmonary embolism, recurrent   Hyperlipidemia   Gout   COPD (chronic obstructive pulmonary disease) (HCC)   Multifocal pneumonia   AKI  (acute kidney injury) (HCC)   Pressure injury of skin   Acute respiratory failure with hypoxia (HCC)   Demand ischemia (HCC)  Severe Sepsis with septic shock(HCC), improved  Acute respiratory failure with hypoxemia, improving  -Secondary to multifocal pneumonia and likely from E. coli. Initially on NRB, transitioned to 6 L via nasal cannula and now on 2 Liters and Nurse weaned to Room Air and saturating in the 90's but has intermittently been on oxygen. Also required pressors briefly during the night for hypotension. Now stable. -Continued IV fluids but are now discontinued  -Continued Empiric Cefepime and changed to po Cefdinir for total of 10 days. Discontinue IV Vancomycin. Follow blood cultures and 1/2 Showed Staph Species and likely contaminant. -Repeated Blood Cx x2 and showed NGTD at 4 Days  -C/w Supportive care with antitussives and Vicodin for pain. Continue as needed nebs -Wean oxygen as tolerated, incentive spirometry. -Continue to Monitor for Infection  -CXR this AM showed "Lung volumes are slightly diminished from comparison with basilar atelectasis. Chronic architectural distortion in the right upper lung. Patchy bilateral areas of airspace disease most pronounced in the left lung base are similar to comparison. Suspect a trace left effusion. No pneumothorax The aorta is calcified. The remaining cardiomediastinal contours are unremarkable. Degenerative changes are present in the imaged spine and shoulders. No acute osseous or soft tissue abnormality.." -Check SLP Evaluation and he was placed on a Dysphagia 3 Diet with Thin Liquids -Continue to Wean O2 and now off of it -C/w Xopenex 0.63 mg Neb q2hprn Wheezing and SOB and added scheduled Xopenex/Atrovent and will send home on nebulizer with DuoNeb -C/w DuoNeb 3 mL 4 times Daily and Guaifenesin 1200 mg po BID -C/w Flutter Valve and Incentive Spirometry  -Patient is improving and recommending up in Chair -PT/OT  recommending SNF but patient refusing and does not have very much family support at home -Restarted IVF at a rate of 75 mL/hr as he was again Tachycardic but have now stopped today and his tachycardia is improved -Follow-up chest x-ray in 3 to 6 weeks follow-up with pulmonary in 1 to 2 weeks -Patient's respiratory status is improved  Left-sided pleuritic chest pain, improved.  -Likely pleurisy from multifocal pneumonia.  -Bedside ultrasound was done showing small amount of pleural fluid without effusion or loculation. -Pain control with Vicodin and given a 1x dose of Morphine. Empiric antibiotics, DuoNeb and Vicodin as needed for pain. -Will obtain CT of the chest if respiratory symptoms unimproved and has persistent pain but it is improved today so will not obtain   DVT, lower extremity and pulmonary embolism, recurrent -INR was subtherapeutic. Markedly elevated D-dimer. Given presentation of septic shock with multifocal pneumonia respiratory symptoms are likely due to infection rather than PE. -C/w Coumadin with Pharmacy to Dose and continued with IV heparin bridge until today. INR was 2.3 today  -Doppler lower extremity negative for DVT. -Continue home Coumadin regimen at discharge  Elevated troponin/history ofCAD -No EKG changes. Troponin trending down and last Two Troponin was 59 and 69. Cardiology consult appreciated and feel it is pleuritic. Suspect demand ischemia. -Recent 2D echo 1 month back showing normal EF and impaired LV relaxation -Cardiology did not feel there is  any acute cardiac concerns at this time and have signed off the case  Hyperlipidemia/PAD -Lipid panel was checked and showed a total cholesterol/HDL ratio of 2.6, cholesterol level of 71, HDL 27, LDL 20, triglycerides 119, VLDL 24 -Continue statin and cilostazol  COPD (chronic obstructive pulmonary disease) (HCC) -Continue scheduled nebs. Not in acute exacerbation -Continue Supplemental O2 via [place] and  this has been weaned  -Started Xopenex and Atrovent -Continue with cefdinir at discharge  AKI (acute kidney injury) (HCC) Metabolic Acidosis -ATN secondary to septic shock. Improving a.m. labs with fluids and pressors -Patient's CO2 is now 21 and AG is 11 and Chloride is  103 -Continue to Monitor and Trend Renal Fxn -Patient's BUN/Cr is now  18/0.88 -Continue to Monitor Renal Fxn and Repeat CMP in the AM  Leukocytosis -Improving. WBC went from 13.5 -> 9.3 -> 6.6 -> 7.1 -> 7.7 -> 9.8 -> 10.0 -> 11.2 -C/w Abx as delineated as above likely reactive -Continue to Monitor and trend -Procalcitonin is trending down -We will send home on cefdinir for 2 more days for total 10 days  Normocytic Anemia B12 Deficiency  -Patient's hemoglobin/hematocrit went from 12.1/37.5 is now  14.3/41.4 -Checked Anemia Panel and showed iron level of 31, U IBC 140, TIBC 171, saturation ratios of 18%, ferritin level 567, folate of 15.0, and vitamin B12 level of 53 -Will start Niferex and Vitamin B12 Supplementation -Continue monitor for signs and symptoms of bleeding; currently no overt bleeding noted-repeat CBC in a.m.  Thrombocytopenia The patient's platelet count went from 180,000 and is now 276000 -Continue monitor as patient is on a heparin drip and anticoagulated warfarin Continue monitor for signs and symptoms of bleeding -If continues to drop further will need to consider HIT panel if clinically do not feel he has HIT -To monitor and trend and repeat CBC  Hypomagnesemia -Patient magnesium level was extremely low at 1.6 -Replete with IV mag sulfate 2 g prior to discharge -Repeat magnesium level in the a.m.  Hypocalcemia, improved  -Patient's calcium level now 9.4 and adjusted for albumin is a little bit higher -Continue to monitor and trend and repeat CMP in the a.m.  HyperBilirubinemia -Mild at 1.4 -> 1.0 -> 1.3 -> 1.2 -> 0.7 -> 0.8 -Continue to Monitor and trend repeat CMP in  a.m.  Hyponatremia -Improved is now 135 -NS at 75 mL/hr started yesterday now discontinued Continue monitor and trend -Repeat CMP in a.m.  GERD/Indigestion/Acid Reflux/Heartburn -Continue PPI 40 mg p.o. twice daily and Maalox; resume home GERD medication I will continue famotidine at discharge -Will add Famotidine  -Avoid spicy food  Dizziness/Lightheadness, improved -Felt lightheaded a few days ago -Started NS at 75 mL/hr and this is improved -Check Orthostatic Vital Signs -IF not improving will consider Head CT Follow-up with PCP in outpatient setting  Hemoptysis -Mild in the setting of PNA and essentially resolved -Continue guaifenesin -Continue to Monitor while Anticoagulated -Repeat CXR in AM in 3 to 6 weeks and continue to monitor patient continues to have a   Urinary Burning and Discomfort -Check urinalysis and urine culture -Urinalysis showed clear appearance with trace leukocytes, 5 ketones, negative nitrites, rare bacteria, present mucus, 6-10 RBCs per high-power field, and 6-10 WBCs and urine cultures negative -Unlikely this is being a urinary tract infection given the patient has been on antibiotics -Continue to monitor and may need some Pyridium and will try trial for 3 days Follow-up with PCP in outpatient setting I will continue tamsulosin  Discharge Instructions  Discharge  Instructions    Call MD for:  difficulty breathing, headache or visual disturbances   Complete by: As directed    Call MD for:  extreme fatigue   Complete by: As directed    Call MD for:  hives   Complete by: As directed    Call MD for:  persistant dizziness or light-headedness   Complete by: As directed    Call MD for:  persistant nausea and vomiting   Complete by: As directed    Call MD for:  redness, tenderness, or signs of infection (pain, swelling, redness, odor or green/yellow discharge around incision site)   Complete by: As directed    Call MD for:  severe uncontrolled  pain   Complete by: As directed    Call MD for:  temperature >100.4   Complete by: As directed    Diet - low sodium heart healthy   Complete by: As directed    Discharge instructions   Complete by: As directed    You were cared for by a hospitalist during your hospital stay. If you have any questions about your discharge medications or the care you received while you were in the hospital after you are discharged, you can call the unit and ask to speak with the hospitalist on call if the hospitalist that took care of you is not available. Once you are discharged, your primary care physician will handle any further medical issues. Please note that NO REFILLS for any discharge medications will be authorized once you are discharged, as it is imperative that you return to your primary care physician (or establish a relationship with a primary care physician if you do not have one) for your aftercare needs so that they can reassess your need for medications and monitor your lab values.  Follow up with PCP, Cardiology, and Pulmonary as an outpatient. Take all medications as prescribed. If symptoms change or worsen please return to the ED for evaluation   For home use only DME Nebulizer machine   Complete by: As directed    Patient needs a nebulizer to treat with the following condition: Pneumonia   Length of Need: 6 Months   Increase activity slowly   Complete by: As directed      Allergies as of 08/14/2019      Reactions   Codeine Hives, Other (See Comments)   Takes benadryl to stop reaction      Medication List    TAKE these medications   acetaminophen 650 MG CR tablet Commonly known as: TYLENOL Take 650 mg by mouth every 8 (eight) hours as needed for pain.   albuterol 108 (90 Base) MCG/ACT inhaler Commonly known as: VENTOLIN HFA Inhale 2 puffs into the lungs every 6 (six) hours as needed for wheezing or shortness of breath.   cefdinir 300 MG capsule Commonly known as: OMNICEF Take 1  capsule (300 mg total) by mouth every 12 (twelve) hours for 2 days.   cilostazol 100 MG tablet Commonly known as: PLETAL Take 1 tablet (100 mg total) by mouth 2 (two) times daily. Please schedule appointment for refills.   cyanocobalamin 1000 MCG tablet Take 1 tablet (1,000 mcg total) by mouth daily. Start taking on: August 15, 2019   cyclobenzaprine 10 MG tablet Commonly known as: FLEXERIL Take 10 mg by mouth 3 (three) times daily as needed for muscle spasms.   diazepam 2 MG tablet Commonly known as: VALIUM Take 2 mg by mouth every evening.   famotidine 20 MG  tablet Commonly known as: PEPCID Take 1 tablet (20 mg total) by mouth daily.   guaiFENesin 600 MG 12 hr tablet Commonly known as: MUCINEX Take 1 tablet (600 mg total) by mouth 2 (two) times daily.   HYDROcodone-acetaminophen 10-325 MG tablet Commonly known as: NORCO Take 1 tablet by mouth every 4 (four) hours.   ipratropium-albuterol 0.5-2.5 (3) MG/3ML Soln Commonly known as: DUONEB Take 3 mLs by nebulization every 6 (six) hours as needed. FOR COPD J44.9 and Pneumonia J18.9   iron polysaccharides 150 MG capsule Commonly known as: NIFEREX Take 1 capsule (150 mg total) by mouth daily. Start taking on: August 15, 2019   lisinopril 10 MG tablet Commonly known as: ZESTRIL Take 10 mg by mouth daily.   multivitamin with minerals Tabs tablet Take 1 tablet by mouth every 7 (seven) days. Mondays   nebivolol 10 MG tablet Commonly known as: Bystolic Take 1 tablet (10 mg total) by mouth daily.   omeprazole 40 MG capsule Commonly known as: PRILOSEC Take 40 mg by mouth daily.   ondansetron 4 MG tablet Commonly known as: ZOFRAN Take 1 tablet (4 mg total) by mouth every 6 (six) hours as needed for nausea.   phenazopyridine 100 MG tablet Commonly known as: PYRIDIUM Take 1 tablet (100 mg total) by mouth 3 (three) times daily with meals.   senna-docusate 8.6-50 MG tablet Commonly known as: Senokot-S Take 1 tablet  by mouth at bedtime as needed for mild constipation.   simvastatin 40 MG tablet Commonly known as: ZOCOR Take 1 tablet (40 mg total) by mouth daily at 6 PM. Need appointment for refills   tamsulosin 0.4 MG Caps capsule Commonly known as: FLOMAX Take 0.4 mg by mouth every evening.   warfarin 2 MG tablet Commonly known as: Coumadin Take as directed. If you are unsure how to take this medication, talk to your nurse or doctor. Original instructions: Take 1 tablet (2 mg total) by mouth daily. What changed:   how much to take  when to take this  additional instructions   Zyloprim 300 MG tablet Generic drug: allopurinol Take 300 mg by mouth daily.            Durable Medical Equipment  (From admission, onward)         Start     Ordered   08/14/19 0000  For home use only DME Nebulizer machine    Question Answer Comment  Patient needs a nebulizer to treat with the following condition Pneumonia   Length of Need 6 Months      08/14/19 1314          Allergies  Allergen Reactions  . Codeine Hives and Other (See Comments)    Takes benadryl to stop reaction   Consultations:  Cardiology  Pulmonary  Procedures/Studies: Dg Chest Port 1 View  Result Date: 08/13/2019 CLINICAL DATA:  Shortness of breath, fever and hypotension EXAM: PORTABLE CHEST 1 VIEW COMPARISON:  Radiograph 08/13/2019 FINDINGS: Lung volumes are slightly diminished from comparison with basilar atelectasis. Chronic architectural distortion in the right upper lung. Patchy bilateral areas of airspace disease most pronounced in the left lung base are similar to comparison. Suspect a trace left effusion. No pneumothorax The aorta is calcified. The remaining cardiomediastinal contours are unremarkable. Degenerative changes are present in the imaged spine and shoulders. No acute osseous or soft tissue abnormality. IMPRESSION: 1. Patchy bilateral areas of airspace disease are similar to comparison. 2. Suspect a  trace left effusion as well. 3. Chronic  architectural distortion in the right upper lung. 4. Slightly diminished lung volumes with basilar atelectasis. Electronically Signed   By: Kreg Shropshire M.D.   On: 08/13/2019 20:34   Dg Chest Port 1 View  Result Date: 08/13/2019 CLINICAL DATA:  Shortness of breath EXAM: PORTABLE CHEST 1 VIEW COMPARISON:  Two days ago FINDINGS: Patchy bilateral pneumonia that is stable. Normal heart size and stable mediastinal contours. Background of chronic hyperinflation. IMPRESSION: Stable bilateral pneumonia. Electronically Signed   By: Marnee Spring M.D.   On: 08/13/2019 07:05   Dg Chest Port 1 View  Result Date: 08/11/2019 CLINICAL DATA:  Short of breath. EXAM: PORTABLE CHEST 1 VIEW COMPARISON:  08/10/2019 FINDINGS: Normal heart size. Marked chronic interstitial coarsening is identified bilaterally. Superimposed airspace opacities within the left midlung and left base appear unchanged. Mild patchy opacity within the periphery of the right upper lobe is unchanged. Similar appearance of small pleural effusions. IMPRESSION: 1. No change in aeration to the lungs compared with prior exam. 2. Stable patchy opacity within the periphery of the right upper lobe. 3. Stable small pleural effusions. Electronically Signed   By: Signa Kell M.D.   On: 08/11/2019 06:29   Dg Chest Port 1 View  Result Date: 08/10/2019 CLINICAL DATA:  Shortness of breath and cough EXAM: PORTABLE CHEST 1 VIEW COMPARISON:  08/09/2019 FINDINGS: Cardiac shadow is stable. Lungs are well aerated bilaterally. Patchy parenchymal opacities are again seen slightly greater on the left than the right and predominately within the bases. The overall appearance is stable from the prior study. Small left pleural effusion is noted. No acute bony abnormality is seen. IMPRESSION: Stable predominately bibasilar pulmonary opacities. Left pleural effusion. Electronically Signed   By: Alcide Clever M.D.   On: 08/10/2019 07:22    Dg Chest Port 1 View  Result Date: 08/09/2019 CLINICAL DATA:  Shortness of breath. EXAM: PORTABLE CHEST 1 VIEW COMPARISON:  08/06/2019. FINDINGS: Slight improvement in left lung and right upper lung zone opacities from prior exam, but slight worsening at the right lung base. Emphysema. Unchanged heart size and mediastinal contours. No large pleural effusion, however lung bases not included in the field of view. Linear opacity in the right mid lung may be fluid in the fissure or atelectasis. No pneumothorax. IMPRESSION: Makes progression of bilateral lung opacities, with slight improvement in the left lung and right upper lung zone, but slight worsening at the right lung base. Electronically Signed   By: Narda Rutherford M.D.   On: 08/09/2019 05:51   Dg Chest Port 1 View  Result Date: 08/06/2019 CLINICAL DATA:  Cough, fever, shortness of breath. EXAM: PORTABLE CHEST 1 VIEW COMPARISON:  Radiograph 03/05/2018 FINDINGS: Patchy and ground-glass consolidation in the left mid and lower lung zone. Additional patchy opacity in the periphery of the right mid lung. Heart is normal in size with unchanged mediastinal contours. Aortic atherosclerosis. No pulmonary edema. No large pleural effusion. No pneumothorax. No acute osseous abnormalities are seen. IMPRESSION: 1. Patchy and ground-glass opacities in the left mid and lower lung zone and periphery of the right mid lung, suspicious for multifocal pneumonia. Followup PA and lateral chest X-ray is recommended in 3-4 weeks following trial of antibiotic therapy to ensure resolution and exclude underlying malignancy. 2.  Aortic Atherosclerosis (ICD10-I70.0). Electronically Signed   By: Narda Rutherford M.D.   On: 08/06/2019 04:15   Vas [place] Lower Extremity Venous (dvt)  Result Date: 08/07/2019  Lower Venous Study Indications: Swelling.  Limitations: Posterior acoustic shadowing secondary  to arterial calcification. Comparison Study: No prior study. Performing  Technologist: Gertie Fey MHA, RDMS, RVT, RDCS  Examination Guidelines: A complete evaluation includes B-mode imaging, spectral Doppler, color Doppler, and power Doppler as needed of all accessible portions of each vessel. Bilateral testing is considered an integral part of a complete examination. Limited examinations for reoccurring indications may be performed as noted.  +---------+---------------+---------+-----------+----------+--------------+ RIGHT    CompressibilityPhasicitySpontaneityPropertiesThrombus Aging +---------+---------------+---------+-----------+----------+--------------+ CFV      Full           Yes      Yes                                 +---------+---------------+---------+-----------+----------+--------------+ SFJ      Full                                                        +---------+---------------+---------+-----------+----------+--------------+ FV Prox  Full                                                        +---------+---------------+---------+-----------+----------+--------------+ FV Mid   Full                                                        +---------+---------------+---------+-----------+----------+--------------+ FV DistalFull                                                        +---------+---------------+---------+-----------+----------+--------------+ PFV      Full                                                        +---------+---------------+---------+-----------+----------+--------------+ POP      Full           Yes      Yes                                 +---------+---------------+---------+-----------+----------+--------------+ PTV      Full                                                        +---------+---------------+---------+-----------+----------+--------------+ PERO     Full                                                         +---------+---------------+---------+-----------+----------+--------------+   +---------+---------------+---------+-----------+----------+--------------+  LEFT     CompressibilityPhasicitySpontaneityPropertiesThrombus Aging +---------+---------------+---------+-----------+----------+--------------+ CFV      Full           Yes      Yes                                 +---------+---------------+---------+-----------+----------+--------------+ SFJ      Full                                                        +---------+---------------+---------+-----------+----------+--------------+ FV Prox  Full                                                        +---------+---------------+---------+-----------+----------+--------------+ FV Mid   Full                                                        +---------+---------------+---------+-----------+----------+--------------+ FV DistalFull                                                        +---------+---------------+---------+-----------+----------+--------------+ PFV      Full                                                        +---------+---------------+---------+-----------+----------+--------------+ POP      Full           Yes      Yes                                 +---------+---------------+---------+-----------+----------+--------------+ PTV      Full                                                        +---------+---------------+---------+-----------+----------+--------------+ PERO     Full                                                        +---------+---------------+---------+-----------+----------+--------------+  Summary: Right: There is no evidence of deep vein thrombosis in the lower extremity. No cystic structure found in the popliteal fossa. Left: There is no evidence of deep vein thrombosis in the lower extremity. No cystic structure found in the popliteal fossa.  *See table(s)  above for measurements and observations. Electronically signed by Fabienne Bruns MD on 08/07/2019 at 11:22:06 AM.    Final     ECHOCARDIOGRAM IMPRESSIONS    1. Left ventricular ejection fraction, by visual estimation, is 60 to 65%. The left ventricle has normal function. Normal left ventricular size. There is no left ventricular hypertrophy.  2. The tricuspid valve is normal in structure. Tricuspid valve regurgitation is trivial.  3. The aortic valve is tricuspid Aortic valve regurgitation was not visualized by color flow Doppler. Moderate aortic valve stenosis. Mean gradient 19 mmHg wtih AVA 1.15 cm^2.  4. Left ventricular diastolic Doppler parameters are consistent with impaired relaxation pattern of LV diastolic filling.  5. Global right ventricle has normal systolic function.The right ventricular size is normal. No increase in right ventricular wall thickness.  6. Right atrial size was normal.  7. Left atrial size was normal.  8. The inferior vena cava is normal in size with greater than 50% respiratory variability, suggesting right atrial pressure of 3 mmHg.  9. The mitral valve is normal in structure. No evidence of mitral valve regurgitation. No evidence of mitral stenosis. 10. The tricuspid regurgitant velocity is 2.90 m/s, and with an assumed right atrial pressure of 3 mmHg, the estimated right ventricular systolic pressure is mildly elevated at 36.6 mmHg.  FINDINGS  Left Ventricle: Left ventricular ejection fraction, by visual estimation, is 60 to 65%. The left ventricle has normal function. No evidence of left ventricular regional wall motion abnormalities. There is no left ventricular hypertrophy. Normal left  ventricular size. Spectral Doppler shows Left ventricular diastolic Doppler parameters are consistent with impaired relaxation pattern of LV diastolic filling.  Right Ventricle: The right ventricular size is normal. No increase in right ventricular wall thickness. Global  RV systolic function is has normal systolic function. The tricuspid regurgitant velocity is 2.90 m/s, and with an assumed right atrial pressure  of 3 mmHg, the estimated right ventricular systolic pressure is mildly elevated at 36.6 mmHg.  Left Atrium: Left atrial size was normal in size.  Right Atrium: Right atrial size was normal in size  Pericardium: There is no evidence of pericardial effusion.  Mitral Valve: The mitral valve is normal in structure. No evidence of mitral valve stenosis by observation. No evidence of mitral valve regurgitation.  Tricuspid Valve: The tricuspid valve is normal in structure. Tricuspid valve regurgitation is trivial by color flow Doppler.  Aortic Valve: The aortic valve is tricuspid. Aortic valve regurgitation was not visualized by color flow Doppler. Moderate aortic stenosis is present. Aortic valve mean gradient measures 19.0 mmHg. Aortic valve peak gradient measures 29.4 mmHg. Aortic  valve area, by VTI measures 1.15 cm.  Pulmonic Valve: The pulmonic valve was normal in structure. Pulmonic valve regurgitation is not visualized by color flow Doppler.  Aorta: The aortic root is normal in size and structure.  Venous: The inferior vena cava is normal in size with greater than 50% respiratory variability, suggesting right atrial pressure of 3 mmHg.  IAS/Shunts: No atrial level shunt detected by color flow Doppler.     LEFT VENTRICLE          Normals PLAX 2D LVIDd:         4.72 cm  3.6 cm   Diastology                 Normals LVIDs:         3.06 cm  1.7 cm   LV e' lateral:   8.49 cm/s 6.42  cm/s LV PW:         1.39 cm  1.4 cm   LV E/e' lateral: 9.9       15.4 LV IVS:        0.98 cm  1.3 cm   LV e' medial:    6.42 cm/s 6.96 cm/s LVOT diam:     2.10 cm  2.0 cm   LV E/e' medial:  13.1      6.96 LV SV:         67 ml    79 ml LV SV Index:   30.18    45 ml/m2 LVOT Area:     3.46 cm 3.14 cm2    RIGHT VENTRICLE RV Basal diam:  3.00 cm RV S  prime:     10.20 cm/s TAPSE (M-mode): 1.8 cm  LEFT ATRIUM             Index       RIGHT ATRIUM           Index LA diam:        4.10 cm 1.88 cm/m  RA Area:     12.60 cm LA Vol (A2C):   61.4 ml 28.17 ml/m RA Volume:   27.00 ml  12.39 ml/m LA Vol (A4C):   38.3 ml 17.57 ml/m LA Biplane Vol: 49.4 ml 22.66 ml/m  AORTIC VALVE                    Normals AV Area (Vmax):    1.17 cm AV Area (Vmean):   0.99 cm     3.06 cm2 AV Area (VTI):     1.15 cm AV Vmax:           271.00 cm/s AV Vmean:          209.000 cm/s 77 cm/s AV VTI:            0.640 m      3.15 cm2 AV Peak Grad:      29.4 mmHg AV Mean Grad:      19.0 mmHg    3 mmHg LVOT Vmax:         91.60 cm/s LVOT Vmean:        60.000 cm/s  75 cm/s LVOT VTI:          0.212 m      25.3 cm LVOT/AV VTI ratio: 0.33         1   AORTA                 Normals Ao Root diam: 3.30 cm 31 mm  MITRAL VALVE            Normals      TRICUSPID VALVE             Normals MV Area (PHT):                       TR Peak grad:   33.6 mmHg MV PHT:                 55 ms        TR Vmax:        290.00 cm/s 288 cm/s MV Decel Time: 201 msec 187 ms MV E velocity: 83.90 cm/s  103 cm/s  SHUNTS MV A velocity: 108.00 cm/s 70.3 cm/s Systemic VTI:  0.21 m MV E/A ratio:  0.78        1.5  Systemic Diam: 2.10 cm  Subjective: Seen and examined at bedside states that he is still having some burning.  Respiratory status is improved.  Ambulated today with a rolling walker but still wants to go home despite recommendations for SNF and has refused home health.  Respiratory status is improved and his antibiotics were changed to p.o. and he will continue for 2 more days.  No other concerns at present time and he will need to follow-up with PCP, cardiology as well as pulmonary in the outpatient setting and he is understandable and agreeable to plan of care.  Discharge Exam: Vitals:   08/14/19 0803 08/14/19 1321  BP:  128/90  Pulse:  95  Resp:  16  Temp:  97.7 F (36.5 C)   SpO2: 95% 97%   Vitals:   08/14/19 0416 08/14/19 0801 08/14/19 0803 08/14/19 1321  BP: (!) 151/81   128/90  Pulse: 97   95  Resp: 20   16  Temp: 97.8 F (36.6 C)   97.7 F (36.5 C)  TempSrc: Oral   Oral  SpO2: 95% 95% 95% 97%  Weight:      Height:       General: Pt is alert, awake, not in acute distress Cardiovascular: RRR, S1/S2 +, no rubs, no gallops Respiratory: Diminished bilaterally, no wheezing, no rhonchi Abdominal: Soft, NT, Distended, bowel sounds + Extremities: no edema, no cyanosis  The results of significant diagnostics from this hospitalization (including imaging, microbiology, ancillary and laboratory) are listed below for reference.    Microbiology: Recent Results (from the past 240 hour(s))  SARS Coronavirus 2 by RT PCR (hospital order, performed in [facility] hospital lab) Nasopharyngeal Nasopharyngeal Swab     Status: None   Collection Time: 08/06/19  4:09 AM   Specimen: Nasopharyngeal Swab  Result Value Ref Range Status   SARS Coronavirus 2 NEGATIVE NEGATIVE Final    Comment: (NOTE) If result is NEGATIVE SARS-CoV-2 target nucleic acids are NOT DETECTED. The SARS-CoV-2 RNA is generally detectable in upper and lower  respiratory specimens during the acute phase of infection. The lowest  concentration of SARS-CoV-2 viral copies this assay can detect is 250  copies / mL. A negative result does not preclude SARS-CoV-2 infection  and should not be used as the sole basis for treatment or other  patient management decisions.  A negative result may occur with  improper specimen collection / handling, submission of specimen other  than nasopharyngeal swab, presence of viral mutation(s) within the  areas targeted by this assay, and inadequate number of viral copies  (<250 copies / mL). A negative result must be combined with clinical  observations, patient history, and epidemiological information. If result is POSITIVE SARS-CoV-2 target nucleic acids are  DETECTED. The SARS-CoV-2 RNA is generally detectable in upper and lower  respiratory specimens dur ing the acute phase of infection.  Positive  results are indicative of active infection with SARS-CoV-2.  Clinical  correlation with patient history and other diagnostic information is  necessary to determine patient infection status.  Positive results do  not rule out bacterial infection or co-infection with other viruses. If result is PRESUMPTIVE POSTIVE SARS-CoV-2 nucleic acids MAY BE PRESENT.   A presumptive positive result was obtained on the submitted specimen  and confirmed on repeat testing.  While 2019 novel coronavirus  (SARS-CoV-2) nucleic acids may be present in the submitted sample  additional confirmatory testing may be necessary for epidemiological  and / or clinical management purposes  to differentiate  between  SARS-CoV-2 and other Sarbecovirus currently known to infect humans.  If clinically indicated additional testing with an alternate test  methodology [phone]([id number]) is advised. The SARS-CoV-2 RNA is generally  detectable in upper and lower respiratory sp ecimens during the acute  phase of infection. The expected result is Negative. Fact Sheet for Patients:  BoilerBrush.com.cyhttps://www.fda.gov/media/136312/download Fact Sheet for Healthcare Providers: https://pope.com/https://www.fda.gov/media/136313/download This test is not yet approved or cleared by the [place] FDA and has been authorized for detection and/or diagnosis of SARS-CoV-2 by FDA under an Emergency Use Authorization (EUA).  This EUA will remain in effect (meaning this test can be used) for the duration of the COVID-19 declaration under Section 564(b)(1) of the Act, 21 U.S.C. section 360bbb-3(b)(1), unless the authorization is terminated or revoked sooner. Performed at [facility], [address] [place], [place] [address]   Blood culture (routine x 2)     Status: None   Collection Time: 08/06/19  4:30 AM    Specimen: BLOOD RIGHT HAND  Result Value Ref Range Status   Specimen Description BLOOD RIGHT HAND  Final   Special Requests   Final    BOTTLES DRAWN AEROBIC AND ANAEROBIC Blood Culture adequate volume   Culture   Final    NO GROWTH 5 DAYS Performed at [facility], [address] [place], [place] [address]    Report Status 08/11/2019 FINAL  Final  Blood culture (routine x 2)     Status: Abnormal   Collection Time: 08/06/19  4:30 AM   Specimen: BLOOD  Result Value Ref Range Status   Specimen Description BLOOD RIGHT ANTECUBITAL  Final   Special Requests   Final    BOTTLES DRAWN AEROBIC AND ANAEROBIC Blood Culture adequate volume   Culture  Setup Time   Final    GRAM POSITIVE COCCI CRITICAL RESULT CALLED TO, READ BACK BY AND VERIFIED WITH: J GRIMSLEY PHARMD 08/07/19 0141 JDW IN BOTH AEROBIC AND ANAEROBIC BOTTLES    Culture (A)  Final    STAPHYLOCOCCUS SPECIES (COAGULASE NEGATIVE) THE SIGNIFICANCE OF ISOLATING THIS ORGANISM FROM A SINGLE SET OF BLOOD CULTURES WHEN MULTIPLE SETS ARE DRAWN IS UNCERTAIN. PLEASE NOTIFY THE MICROBIOLOGY DEPARTMENT WITHIN ONE WEEK IF SPECIATION AND SENSITIVITIES ARE REQUIRED. Performed at [facility], [address] [place], [place] [address]    Report Status 08/09/2019 FINAL  Final  Culture, sputum-assessment     Status: None   Collection Time: 08/06/19  6:23 AM   Specimen: Sputum  Result Value Ref Range Status   Specimen Description SPUTUM  Final   Special Requests NONE  Final   Sputum evaluation   Final    THIS SPECIMEN IS ACCEPTABLE FOR SPUTUM CULTURE Performed at [facility], [address] [place], [place] [address]    Report Status 08/07/2019 FINAL  Final  Culture, respiratory     Status: None   Collection Time: 08/06/19  6:23 AM   Specimen: SPU  Result Value Ref Range Status   Specimen Description   Final    SPUTUM Performed at [facility], [address] [place], [place] [address]     Special Requests   Final    NONE Reflexed from [id number] Performed at [facility], [address] [place], [place] [address]    Gram Stain   Final    ABUNDANT WBC PRESENT,BOTH PMN AND MONONUCLEAR FEW GRAM POSITIVE COCCI FEW GRAM POSITIVE RODS Performed at [facility], [address] [place], [place] [address]    Culture FEW ESCHERICHIA COLI  Final   Report Status 08/10/2019 FINAL  Final   Organism ID, Bacteria ESCHERICHIA COLI  Final      Susceptibility   Escherichia coli - MIC*    AMPICILLIN 8 SENSITIVE Sensitive     CEFAZOLIN <=4 SENSITIVE Sensitive     CEFEPIME <=1 SENSITIVE Sensitive     CEFTAZIDIME <=1 SENSITIVE Sensitive     CEFTRIAXONE <=1 SENSITIVE Sensitive     CIPROFLOXACIN >=4 RESISTANT Resistant     GENTAMICIN <=1 SENSITIVE Sensitive     IMIPENEM <=0.25 SENSITIVE Sensitive     TRIMETH/SULFA <=20 SENSITIVE Sensitive     AMPICILLIN/SULBACTAM 4 SENSITIVE Sensitive     PIP/TAZO <=4 SENSITIVE Sensitive     Extended ESBL NEGATIVE Sensitive     * FEW ESCHERICHIA COLI  Culture, Urine     Status: Abnormal   Collection Time: 08/06/19  8:36 AM   Specimen: Urine, Clean Catch  Result Value Ref Range Status   Specimen Description   Final    URINE, CLEAN CATCH Performed at [facility], [address] [place], [place] [address]    Special Requests   Final    NONE Performed at [facility], [address] [place], [place] [address]    Culture (A)  Final    <10,000 COLONIES/mL INSIGNIFICANT GROWTH Performed at [facility], [address], [place], [place] [address]    Report Status 08/07/2019 FINAL  Final  MRSA PCR Screening     Status: None   Collection Time: 08/07/19  9:30 PM   Specimen: Nasal Mucosa; Nasopharyngeal  Result Value Ref Range Status   MRSA by PCR NEGATIVE NEGATIVE Final    Comment:        The GeneXpert MRSA Assay (FDA approved for NASAL specimens only), is one component of  a comprehensive MRSA colonization surveillance program. It is not intended to diagnose MRSA infection nor to guide or monitor treatment for MRSA infections. Performed at [facility], [address] [place], [place] [address]   Culture, blood (routine x 2)     Status: None (Preliminary result)   Collection Time: 08/10/19  4:44 PM   Specimen: BLOOD  Result Value Ref Range Status   Specimen Description   Final    BLOOD RIGHT ANTECUBITAL Performed at [facility], [address], [place], [place] [address]    Special Requests   Final    BOTTLES DRAWN AEROBIC AND ANAEROBIC Blood Culture adequate volume Performed at [facility], [address] [place], [place] [address]    Culture   Final    NO GROWTH 4 DAYS Performed at [facility], [address] [place], [place] [address]    Report Status PENDING  Incomplete  Culture, blood (routine x 2)     Status: None (Preliminary result)   Collection Time: 08/10/19  4:49 PM   Specimen: BLOOD  Result Value Ref Range Status   Specimen Description   Final    BLOOD RIGHT ARM Performed at [facility], [address], [place], [place] [address]    Special Requests   Final    BOTTLES DRAWN AEROBIC AND ANAEROBIC Blood Culture adequate volume Performed at [facility], [address] [place], [place] [address]    Culture   Final    NO GROWTH 4 DAYS Performed at [facility], [address], [place], [place] [address]    Report Status PENDING  Incomplete  Culture, Urine     Status: None   Collection Time: 08/13/19 11:46 AM   Specimen: Urine, Random  Result Value Ref Range Status   Specimen Description   Final    URINE, RANDOM Performed at [facility], [address] [place], [place] [address]    Special Requests   Final    NONE Performed at [facility], [address], [place],  [place] [address]    Culture   Final    NO GROWTH Performed at [facility], [address], [place], [place] [address]    Report Status 08/14/2019 FINAL  Final    Labs: BNP (last 3 results) Recent Labs    08/06/19 0430  BNP 99.5   Basic Metabolic Panel: Recent Labs  Lab 08/10/19 0358 08/11/19 0454 08/12/19 0417 08/13/19 0403 08/14/19 0429  NA 134* 134* 135 136 135  K 3.9 3.6 3.4* 3.9 4.2  CL 103 102 101 102 103  CO2 19* 20* 22 22 21*  GLUCOSE 97 99 98 87 90  BUN CREATININE 0.84 0.69 0.67 0.82 0.88  CALCIUM 9.0 9.2 9.2 9.3 9.4  MG 1.6* 1.6* 1.7 1.9 1.6*  PHOS 3.0 3.2 3.2 3.0 3.5   Liver Function Tests: Recent Labs  Lab 08/10/19 0358 08/11/19 0454 08/12/19 0417 08/13/19 0403 08/14/19 0429  AST 14* 14* ALT ALKPHOS 68 69 71 68 67  BILITOT 1.0 1.3* 1.2 0.7 0.8  PROT 6.4* 6.7 6.8 6.6 7.0  ALBUMIN 2.8* 3.0* 3.0* 3.1* 3.2*   No results for input(s): LIPASE, AMYLASE in the last 168 hours. No results for input(s): AMMONIA in the last 168 hours. CBC: Recent Labs  Lab 08/10/19 0358 08/11/19 0454 08/12/19 0417 08/13/19 0403 08/14/19 0429  WBC 7.1 7.7 9.8 10.0 11.3*  NEUTROABS 5.7 5.5 6.7 7.4 7.8*  HGB 12.6* 13.0 13.3 13.0 13.3  HCT 39.1 39.4 40.6 40.1 41.4  MCV 94.9 92.5 92.9 93.0 93.9  PLT 182 186 223 259 276   Cardiac Enzymes: No results for input(s): CKTOTAL, CKMB, CKMBINDEX, TROPONINI in the last 168 hours. BNP: Invalid input(s): POCBNP CBG: No results for input(s): GLUCAP in the last 168 hours. D-Dimer No results for input(s): DDIMER in the last 72 hours. Hgb A1c No results for input(s): HGBA1C in the last 72 hours. Lipid Profile No results for input(s): CHOL, HDL, LDLCALC, TRIG, CHOLHDL, LDLDIRECT in the last 72 hours. Thyroid function studies No results for input(s): TSH, T4TOTAL, T3FREE, THYROIDAB in the last 72 hours.  Invalid input(s): FREET3 Anemia work up No results for input(s): VITAMINB12,  FOLATE, FERRITIN, TIBC, IRON, RETICCTPCT in the last 72 hours. Urinalysis    Component Value Date/Time   COLORURINE YELLOW 08/13/2019 1146   APPEARANCEUR CLEAR 08/13/2019 1146   LABSPEC 1.017 08/13/2019 1146   PHURINE 6.0 08/13/2019 1146   GLUCOSEU NEGATIVE 08/13/2019 1146   HGBUR NEGATIVE 08/13/2019 1146   BILIRUBINUR NEGATIVE 08/13/2019 1146   KETONESUR 5 (A) 08/13/2019 1146   PROTEINUR 30 (A) 08/13/2019 1146   UROBILINOGEN 0.2 04/08/2015 2033   NITRITE NEGATIVE 08/13/2019 1146   LEUKOCYTESUR TRACE (A) 08/13/2019 1146   Sepsis Labs Invalid input(s): PROCALCITONIN,  WBC,  LACTICIDVEN Microbiology Recent Results (from the past 240 hour(s))  SARS Coronavirus 2 by RT PCR (hospital order, performed in [facility] hospital lab) Nasopharyngeal Nasopharyngeal Swab     Status: None   Collection Time: 08/06/19  4:09 AM   Specimen: Nasopharyngeal Swab  Result Value Ref Range Status   SARS Coronavirus 2 NEGATIVE NEGATIVE Final    Comment: (NOTE) If result is NEGATIVE SARS-CoV-2 target nucleic acids are NOT DETECTED. The SARS-CoV-2 RNA is generally detectable in upper and lower  respiratory specimens during the acute phase of infection. The lowest  concentration of SARS-CoV-2 viral copies this assay can detect is 250  copies / mL. A negative result does not preclude SARS-CoV-2 infection  and should not be used as the sole basis for treatment or other  patient management decisions.  A negative result may occur with  improper specimen collection / handling, submission of specimen other  than nasopharyngeal swab, presence of viral mutation(s) within the  areas targeted by this assay, and inadequate number of viral copies  (<250 copies / mL). A negative result must be combined with clinical  observations, patient history, and epidemiological information. If result is POSITIVE SARS-CoV-2 target nucleic acids are DETECTED. The SARS-CoV-2 RNA is generally detectable in upper and lower   respiratory specimens dur ing the acute phase of infection.  Positive  results are indicative of active infection with SARS-CoV-2.  Clinical  correlation with patient history and other diagnostic information is  necessary to determine patient infection status.  Positive results do  not rule out bacterial infection or co-infection with other viruses. If result is PRESUMPTIVE POSTIVE SARS-CoV-2 nucleic acids MAY BE PRESENT.   A presumptive positive result was obtained on the submitted specimen  and confirmed on repeat testing.  While 2019 novel coronavirus  (SARS-CoV-2) nucleic acids may be present in the submitted sample  additional confirmatory testing may be necessary for epidemiological  and / or clinical management purposes  to differentiate between  SARS-CoV-2 and other Sarbecovirus currently known to infect humans.  If clinically indicated additional testing with an alternate test  methodology [phone]) is advised. The SARS-CoV-2 RNA is generally  detectable in upper and lower respiratory sp ecimens during the acute  phase of infection. The expected result is Negative. Fact Sheet for Patients:  BoilerBrush.com.cy Fact Sheet for Healthcare Providers: https://pope.com/ This test is not yet approved or cleared by the [place] FDA and has been authorized for detection and/or diagnosis of SARS-CoV-2 by FDA under an Emergency Use Authorization (EUA).  This EUA will remain in effect (meaning this test can be used) for the duration of the COVID-19 declaration under Section 564(b)(1) of the Act, 21 U.S.C. section 360bbb-3(b)(1), unless the authorization is terminated or revoked sooner. Performed at [facility], [address], [place], [place] [address]   Blood culture (routine x 2)     Status: None   Collection Time: 08/06/19  4:30 AM   Specimen: BLOOD RIGHT HAND  Result Value Ref Range Status   Specimen  Description BLOOD RIGHT HAND  Final   Special Requests   Final    BOTTLES DRAWN AEROBIC AND ANAEROBIC Blood Culture adequate volume   Culture   Final    NO GROWTH 5 DAYS Performed at [facility], [address], [place], [place] [address]    Report Status 08/11/2019 FINAL  Final  Blood culture (routine x 2)     Status: Abnormal   Collection Time: 08/06/19  4:30 AM   Specimen: BLOOD  Result Value Ref Range Status   Specimen Description BLOOD RIGHT ANTECUBITAL  Final  Special Requests   Final    BOTTLES DRAWN AEROBIC AND ANAEROBIC Blood Culture adequate volume   Culture  Setup Time   Final    GRAM POSITIVE COCCI CRITICAL RESULT CALLED TO, READ BACK BY AND VERIFIED WITH: J GRIMSLEY PHARMD 08/07/19 0141 JDW IN BOTH AEROBIC AND ANAEROBIC BOTTLES    Culture (A)  Final    STAPHYLOCOCCUS SPECIES (COAGULASE NEGATIVE) THE SIGNIFICANCE OF ISOLATING THIS ORGANISM FROM A SINGLE SET OF BLOOD CULTURES WHEN MULTIPLE SETS ARE DRAWN IS UNCERTAIN. PLEASE NOTIFY THE MICROBIOLOGY DEPARTMENT WITHIN ONE WEEK IF SPECIATION AND SENSITIVITIES ARE REQUIRED. Performed at [facility], [address] [place], [place] [address]    Report Status 08/09/2019 FINAL  Final  Culture, sputum-assessment     Status: None   Collection Time: 08/06/19  6:23 AM   Specimen: Sputum  Result Value Ref Range Status   Specimen Description SPUTUM  Final   Special Requests NONE  Final   Sputum evaluation   Final    THIS SPECIMEN IS ACCEPTABLE FOR SPUTUM CULTURE Performed at [facility], [address] [place], [place] [address]    Report Status 08/07/2019 FINAL  Final  Culture, respiratory     Status: None   Collection Time: 08/06/19  6:23 AM   Specimen: SPU  Result Value Ref Range Status   Specimen Description   Final    SPUTUM Performed at [facility], [address], [place], [place] [address]    Special Requests   Final    NONE Reflexed from [id number] Performed at  [facility], [address], [place], [place] [address]    Gram Stain   Final    ABUNDANT WBC PRESENT,BOTH PMN AND MONONUCLEAR FEW GRAM POSITIVE COCCI FEW GRAM POSITIVE RODS Performed at [facility], [address], [place], [place] [address]    Culture FEW ESCHERICHIA COLI  Final   Report Status 08/10/2019 FINAL  Final   Organism ID, Bacteria ESCHERICHIA COLI  Final      Susceptibility   Escherichia coli - MIC*    AMPICILLIN 8 SENSITIVE Sensitive     CEFAZOLIN <=4 SENSITIVE Sensitive     CEFEPIME <=1 SENSITIVE Sensitive     CEFTAZIDIME <=1 SENSITIVE Sensitive     CEFTRIAXONE <=1 SENSITIVE Sensitive     CIPROFLOXACIN >=4 RESISTANT Resistant     GENTAMICIN <=1 SENSITIVE Sensitive     IMIPENEM <=0.25 SENSITIVE Sensitive     TRIMETH/SULFA <=20 SENSITIVE Sensitive     AMPICILLIN/SULBACTAM 4 SENSITIVE Sensitive     PIP/TAZO <=4 SENSITIVE Sensitive     Extended ESBL NEGATIVE Sensitive     * FEW ESCHERICHIA COLI  Culture, Urine     Status: Abnormal   Collection Time: 08/06/19  8:36 AM   Specimen: Urine, Clean Catch  Result Value Ref Range Status   Specimen Description   Final    URINE, CLEAN CATCH Performed at [facility], [address], [place], [place] [address]    Special Requests   Final    NONE Performed at [facility], [address], [place], [place] [address]    Culture (A)  Final    <10,000 COLONIES/mL INSIGNIFICANT GROWTH Performed at [facility], [address] [place], [place] [address]    Report Status 08/07/2019 FINAL  Final  MRSA PCR Screening     Status: None   Collection Time: 08/07/19  9:30 PM   Specimen:  Nasal Mucosa; Nasopharyngeal  Result Value Ref Range Status   MRSA by PCR NEGATIVE NEGATIVE Final    Comment:        The GeneXpert MRSA Assay (FDA approved for NASAL specimens only), is one component of a comprehensive MRSA colonization surveillance program. It is not intended  to diagnose MRSA infection nor to guide or monitor treatment for MRSA infections. Performed at [facility], [address] [place], [place] [address]   Culture, blood (routine x 2)     Status: None (Preliminary result)   Collection Time: 08/10/19  4:44 PM   Specimen: BLOOD  Result Value Ref Range Status   Specimen Description   Final    BLOOD RIGHT ANTECUBITAL Performed at [facility], [address], [place], [place] [address]    Special Requests   Final    BOTTLES DRAWN AEROBIC AND ANAEROBIC Blood Culture adequate volume Performed at [facility], [address], [place], [place] [address]    Culture   Final    NO GROWTH 4 DAYS Performed at [facility], [address] [place], [place] [address]    Report Status PENDING  Incomplete  Culture, blood (routine x 2)     Status: None (Preliminary result)   Collection Time: 08/10/19  4:49 PM   Specimen: BLOOD  Result Value Ref Range Status   Specimen Description   Final    BLOOD RIGHT ARM Performed at [facility], [address], [place], [place] [address]    Special Requests   Final    BOTTLES DRAWN AEROBIC AND ANAEROBIC Blood Culture adequate volume Performed at [facility], [address], [place], [place] [address]    Culture   Final    NO GROWTH 4 DAYS Performed at [facility], [address] [place], [place] [address]    Report Status PENDING  Incomplete  Culture, Urine     Status: None   Collection Time: 08/13/19 11:46 AM   Specimen: Urine, Random  Result Value Ref Range Status   Specimen Description   Final    URINE, RANDOM Performed at [facility], [address] [place], [place] [address]    Special Requests   Final    NONE Performed at [facility], [address], [place], [place] [address]    Culture   Final    NO GROWTH Performed at [facility], [address] [place], [place] [address]    Report Status 08/14/2019 FINAL  Final   Time coordinating discharge: 35 minutes  SIGNED:  Merlene Laughter, DO [facility] 08/14/2019, 8:10 PM Pager is on AMION  If 7PM-7AM, please contact night-coverage [email or web address] Password [email or web address]
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
## 2019-08-14 NOTE — TOC Progression Note (Signed)
Transition of Care [facility]) - Progression Note    Patient Details  Name: Tony Zhang MRN: [id number] Date of Birth: Sep 02, 1946  Transition of Care [facility]) CM/SW Redbird Smith, LCSW Phone Number: 08/14/2019, 1:33 PM  Clinical Narrative:   CSW following patient for discharge needs. Patient declined [place], stating that he doesn't want anyone following help in the home. CSW ordered a rolling walker and a nebulizer and stated that patient will need equipment prior to leaving     Expected Discharge Plan: Home/Self Care Barriers to Discharge: Continued Medical Work up  Expected Discharge Plan and Services Expected Discharge Plan: Home/Self Care In-house Referral: Clinical Social Work     Living arrangements for the past 2 months: Single Family Home Expected Discharge Date: 08/14/19                                     Social Determinants of Health (SDOH) Interventions    Readmission Risk Interventions No flowsheet data found.

## 2019-08-14 NOTE — Progress Notes (Signed)
[place] for warfarin Indication: VTE treatment  Allergies  Allergen Reactions  . Codeine Hives and Other (See Comments)    Takes benadryl to stop reaction    Patient Measurements: Height: 6\' 1"  (185.4 cm) Weight: 190 lb 9.6 oz (86.5 kg) IBW/kg (Calculated) : 79.9  Vital Signs: Temp: 97.8 F (36.6 C) (10/27 0416) Temp Source: Oral (10/27 0416) BP: 151/81 (10/27 0416) Pulse Rate: 97 (10/27 0416)  Labs: Recent Labs    08/12/19 0417 08/13/19 0403 08/14/19 0429  HGB 13.3 13.0 13.3  HCT 40.6 40.1 41.4  PLT 223 259 276  LABPROT 33.1* 30.3* 25.1*  INR 3.3* 3.0* 2.3*  CREATININE 0.67 0.82 0.88    Estimated Creatinine Clearance: 83.2 mL/min (by C-G formula based on SCr of 0.88 mg/dL).   Assessment: Pt is a 60 YOM presenting with c/o SOB, cough and congestion. Pt has a hx of PAD, AAA, bilateral renal artery stenosis and HTN. Pt found to have multifocal pneumonia. Pharmacy to dose warfarin.   Baseline INR subtherapeutic  Prior anticoagulation: warfarin 4mg  PO daily  Significant events:  Today, 08/14/2019:  CBC: WNL and steady  INR therapeutic at 2.3, dropped from 3 yesterday. Was previously rising and peaked at 3.3  Major drug interactions: cilostazol (taking PTA w/ warfarin)  No bleeding issues per nursing  Eating 50-100% of meals  Goal of Therapy: INR 2-3  Plan:  Warfarin 4mg  PO x 1  Daily INR  CBC at least q72 hr while on warfarin  Monitor for signs of bleeding or thrombosis  Natale Lay, PharmD Candidate 08/14/2019,10:25 AM

## 2019-08-14 NOTE — Progress Notes (Signed)
  Speech Language Pathology Treatment: Dysphagia  Patient Details Name: SRIHAN BRUTUS MRN: [id number] DOB: 11/06/1945 Today's Date: 08/14/2019 Time: 1594-5859 SLP Time Calculation (min) (ACUTE ONLY): 30 min  Assessment / Plan / Recommendation Clinical Impression  Pt able to pass 3 ounce water test on 2nd attempt with no difficulties.  He does admit to refluxing when coughing, advised him to eat several small meals/day and reviewed foods to avoid if cause symptoms, pt does have h/o ulcer with bleeding scoped 03/2015; advised he follow up with a GI MD as OP if his symptoms do not improve; using teach back, pt educated.  Voice is strong and pt aware of his GERD and modifies diet significantly.    HPI HPI: 73 yo male admitted to ED on 10/19 for PNA sespis. Pt with elevated troponin, now downtrending with cardiology documenting potential demand ischemia due to sepsis. PMH includes COPD, dyslipidemia, HTN, PVD, AAA, history of DVT and PE, bilateral renal artery stenosis, lumbar surgery.  Most recent CXR on 10/22 revealed "bilateral lung opacities, with slight improvement in the left lung and right upper lung zone, but slight worsening at the right lung base."  Pt denied hx of dysphagia.       SLP Plan  All goals met       Recommendations  Diet recommendations: Dysphagia 3 (mechanical soft);Thin liquid Liquids provided via: Straw;Cup Medication Administration: Whole meds with liquid Supervision: Patient able to self feed;Intermittent supervision to cue for compensatory strategies Compensations: Minimize environmental distractions;Slow rate;Small sips/bites Postural Changes and/or Swallow Maneuvers: Seated upright 90 degrees;Upright 30-60 min after meal                Oral Care Recommendations: Oral care BID Follow up Recommendations: Other (comment)(TBD) SLP Visit Diagnosis: Dysphagia, unspecified (R13.10) Plan: All goals met       GO                Macario Golds 08/14/2019, 9:29 AM Luanna Salk, MS [facility] SLP Acute Rehab Services Pager [phone] Office [phone]

## 2019-08-15 LAB — CULTURE, BLOOD (ROUTINE X 2)
Culture: NO GROWTH
Culture: NO GROWTH
Special Requests: ADEQUATE
Special Requests: ADEQUATE

## 2020-06-24 ENCOUNTER — Other Ambulatory Visit: Payer: Self-pay

## 2020-06-24 ENCOUNTER — Ambulatory Visit
Admission: RE | Admit: 2020-06-24 | Discharge: 2020-06-24 | Disposition: A | Discharge status: Home / Self Care | Payer: Medicare Other | Source: Ambulatory Visit | Attending: Family Medicine | Admitting: Family Medicine

## 2020-06-24 ENCOUNTER — Other Ambulatory Visit: Payer: Self-pay | Admitting: Family Medicine

## 2020-06-24 DIAGNOSIS — R059 Cough, unspecified: Secondary | ICD-10-CM

## 2020-07-18 ENCOUNTER — Other Ambulatory Visit: Payer: Self-pay | Admitting: Physician Assistant

## 2020-07-18 ENCOUNTER — Encounter: Payer: Self-pay | Admitting: Physician Assistant

## 2020-07-18 ENCOUNTER — Telehealth (INDEPENDENT_AMBULATORY_CARE_PROVIDER_SITE_OTHER): Payer: Medicare Other | Admitting: Physician Assistant

## 2020-07-18 VITALS — Ht 73.0 in | Wt 230.0 lb

## 2020-07-18 DIAGNOSIS — E785 Hyperlipidemia, unspecified: Secondary | ICD-10-CM

## 2020-07-18 DIAGNOSIS — I350 Nonrheumatic aortic (valve) stenosis: Secondary | ICD-10-CM

## 2020-07-18 DIAGNOSIS — I10 Essential (primary) hypertension: Secondary | ICD-10-CM | POA: Diagnosis not present

## 2020-07-18 DIAGNOSIS — I739 Peripheral vascular disease, unspecified: Secondary | ICD-10-CM

## 2020-07-18 DIAGNOSIS — I701 Atherosclerosis of renal artery: Secondary | ICD-10-CM | POA: Diagnosis not present

## 2020-07-18 DIAGNOSIS — R0609 Other forms of dyspnea: Secondary | ICD-10-CM

## 2020-07-18 DIAGNOSIS — I824Z9 Acute embolism and thrombosis of unspecified deep veins of unspecified distal lower extremity: Secondary | ICD-10-CM

## 2020-07-18 DIAGNOSIS — R0600 Dyspnea, unspecified: Secondary | ICD-10-CM

## 2020-07-18 NOTE — Patient Instructions (Addendum)
Medication Instructions:  NONE ordered at this time of appointment   *If you need a refill on your cardiac medications before your next appointment, please call your pharmacy*  Lab Work: NONE ordered at this time of appointment   If you have labs (blood work) drawn today and your tests are completely normal, you will receive your results only by: [name] MyChart Message (if you have MyChart) OR . A paper copy in the mail If you have any lab test that is abnormal or we need to change your treatment, we will call you to review the results.  Testing/Procedures: Your physician has requested that you have an echocardiogram. Echocardiography is a painless test that uses sound waves to create images of your heart. It provides your doctor with information about the size and shape of your heart and how well your heart's chambers and valves are working. This procedure takes approximately one hour. There are no restrictions for this procedure.   Please schedule for and/or within 6 months    Follow-Up: At [facility], you and your health needs are our priority.  As part of our continuing mission to provide you with exceptional heart care, we have created designated Provider Care Teams.  These Care Teams include your primary Cardiologist (physician) and Advanced Practice Providers (APPs -  Physician Assistants and Nurse Practitioners) who all work together to provide you with the care you need, when you need it.  We recommend signing up for the patient portal called "MyChart".  Sign up information is provided on this After Visit Summary.  MyChart is used to connect with patients for Virtual Visits (Telemedicine).  Patients are able to view lab/test results, encounter notes, upcoming appointments, etc.  Non-urgent messages can be sent to your provider as well.   To learn more about what you can do with MyChart, go to [email or web address].    Your next appointment:   1 year(s)  The format for your next  appointment:   In Person  Provider:   Thurmon Fair, MD  Other Instructions

## 2020-07-18 NOTE — Progress Notes (Signed)
Virtual Visit via Telephone Note   This visit type was conducted due to national recommendations for restrictions regarding the COVID-19 Pandemic (e.g. social distancing) in an effort to limit this patient's exposure and mitigate transmission in our community.  Due to his co-morbid illnesses, this patient is at least at moderate risk for complications without adequate follow up.  This format is felt to be most appropriate for this patient at this time.  The patient did not have access to video technology/had technical difficulties with video requiring transitioning to audio format only (telephone).  All issues noted in this document were discussed and addressed.  No physical exam could be performed with this format.  Please refer to the patient's chart for his  consent to telehealth for [facility].    Date:  07/20/2020   ID:  Tony Zhang, DOB 1946-06-28, MRN [id number] The patient was identified using 2 identifiers.  Patient Location: Home Provider Location: Office/Clinic  PCP:  [name], MD  Cardiologist:  Thurmon Fair, MD  Electrophysiologist:  None   Evaluation Performed:  Follow-Up Visit  Chief Complaint:  Follow up  History of Present Illness:    Tony Zhang is a 74 y.o. 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  He has known small abdominal aortic aneurysm in the infrarenal area, however aorta is totally occluded above the iliac bifurcation.  He has a history of reduced ABI bilaterally in the 0.7-0.8 range.  He also is suspected to have coronary artery disease, possible occlusion of the RCA based on the previous Myoview.  However he has no anginal symptom and EF has been normal, therefore he was medically managed.  He was admitted in January 2020 with a bowel obstruction.  Echocardiogram obtained during the same  admission on 11/13/2018 showed EF 40 to 45%, grade 1 DD, mild aortic stenosis, trivial mitral regurgitation.  When he was seen by Dr. Royann Shivers for follow-up, he denied any significant symptoms.  Therefore a repeat echocardiogram was ordered for 07/09/2019, this revealed ejection fraction has improved back to 60 to 65%, moderate aortic stenosis.  Given resolution of LV dysfunction, further ischemic work-up was not pursued.  Since the last visit, patient had a hospitalization in October 2020 due to sepsis secondary to multifocal pneumonia.  Covid test was negative.  He was treated with IV fluid, antibiotic and pressors for septic shock.  He has some pleuritic chest pain and troponin was mildly elevated.  He was seen by cardiology service during the hospitalization who felt the elevated troponin is likely demand ischemia.  Patient presents today for annual cardiology visit.  His medication list is very confusing.  Both labetalol and metoprolol are listed.  Also both losartan and lisinopril are listed as well.  We will contact his pharmacy to verify which medications he is supposed to be on.  He is aware that he should not be taking 2 beta-blockers and also ACE inhibitor plus a ARB at the same time.  Otherwise he has been stable from cardiology perspective.  He is unable to give me any vital signs today.  He does have left lower extremity weakness, this is chronic and unchanged.  He attributed to his back discomfort.  He denies any ambulatory leg pain.  He has chronic dyspnea on exertion, he can walk roughly 50 to 100 feet before getting out of breath.  His this is related to prior history of lung issue.  Overall, he is stable from cardiac perspective and can follow-up in 1 year.  He will need a nonurgent echocardiogram in the next few months for aortic stenosis.  The patient does not have symptoms concerning for COVID-19 infection (fever, chills, cough, or new shortness of breath).    Past Medical History:    Diagnosis Date  . AAA (abdominal aortic aneurysm) (HCC)   . 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    [name] COPD (chronic obstructive pulmonary disease) (HCC)   . DJD (degenerative joint disease)   . Dyslipidemia   . Gout   . Malignant hypertension   . Peripheral vascular disease (HCC)   . Pneumonia 08/2013   pt. reports that he is having this surgery 11/25/2014- for the lung problem that began with pneumonia in 09/2014  . Shortness of breath   . Venous thrombosis    Recurrent   Past Surgical History:  Procedure Laterality Date  . APPENDECTOMY    . BACK SURGERY    . COLONOSCOPY N/A 04/13/2015   Procedure: COLONOSCOPY;  Surgeon: Carman Ching, MD;  Location: [facility] ENDOSCOPY;  Service: Endoscopy;  Laterality: N/A;  . EMPYEMA DRAINAGE N/A 11/25/2014   Procedure: EMPYEMA DRAINAGE;  Surgeon: Delight Ovens, MD;  Location[facility] OR;  Service: Thoracic;  Laterality: N/A;  . ESOPHAGOGASTRODUODENOSCOPY N/A 04/10/2015   Procedure: ESOPHAGOGASTRODUODENOSCOPY (EGD);  Surgeon: Charlott Rakes, MD;  Location: [facility] ENDOSCOPY;  Service: Endoscopy;  Laterality: N/A;  . EYE SURGERY     both eyes, cataracts removed, denies lens implants  . FLEXIBLE SIGMOIDOSCOPY N/A 04/12/2015   Procedure: FLEXIBLE SIGMOIDOSCOPY;  Surgeon: Carman Ching, MD;  Location: [facility] ENDOSCOPY;  Service: Endoscopy;  Laterality: N/A;  . HERNIA REPAIR    . SHOULDER SURGERY Right   . VIDEO ASSISTED THORACOSCOPY Right 11/25/2014   Procedure: VIDEO ASSISTED THORACOSCOPY;  Surgeon: Delight Ovens, MD;  Location: [facility] OR;  Service: Thoracic;  Laterality: Right;  [name] VIDEO BRONCHOSCOPY N/A 11/25/2014   Procedure: VIDEO BRONCHOSCOPY;  Surgeon: Delight Ovens, MD;  Location: [facility] OR;  Service: Thoracic;  Laterality: N/A;     Current Meds  Medication Sig  . acetaminophen (TYLENOL) 650 MG CR tablet Take 650 mg by mouth every 8 (eight) hours as needed for pain.  [name] albuterol (VENTOLIN HFA)  108 (90 Base) MCG/ACT inhaler Inhale 2 puffs into the lungs every 6 (six) hours as needed for wheezing or shortness of breath.  . allopurinol (ZYLOPRIM) 300 MG tablet Take 300 mg by mouth daily.  . cilostazol (PLETAL) 100 MG tablet Take 1 tablet (100 mg total) by mouth 2 (two) times daily. Please schedule appointment for refills.  . cyclobenzaprine (FLEXERIL) 10 MG tablet Take 10 mg by mouth 3 (three) times daily as needed for muscle spasms.  . diazepam (VALIUM) 2 MG tablet Take 2 mg by mouth every evening.  . famotidine (PEPCID) 20 MG tablet Take 1 tablet (20 mg total) by mouth daily.  . fenofibrate 160 MG tablet fenofibrate 160 mg tablet  . fluticasone (FLONASE) 50 MCG/ACT nasal spray fluticasone propionate 50 mcg/actuation nasal spray,suspension  . guaiFENesin (MUCINEX) 600 MG 12 hr tablet Take 1 tablet (600 mg total) by mouth 2 (two) times daily.  . hydrochlorothiazide (HYDRODIURIL) 25 MG tablet hydrochlorothiazide 25 mg tablet  . HYDROcodone-acetaminophen (NORCO) 10-325 MG tablet Take 1 tablet by mouth every 4 (four) hours.  [name]  ipratropium-albuterol (DUONEB) 0.5-2.5 (3) MG/3ML SOLN Take 3 mLs by nebulization every 6 (six) hours as needed. FOR COPD J44.9 and Pneumonia J18.9  . iron polysaccharides (NIFEREX) 150 MG capsule Take 1 capsule (150 mg total) by mouth daily.  [name]. lisinopril (ZESTRIL) 10 MG tablet Take 10 mg by mouth daily.  . metoprolol succinate (TOPROL-XL) 100 MG 24 hr tablet metoprolol succinate ER 100 mg tablet,extended release 24 hr  . Multiple Vitamin (MULTIVITAMIN WITH MINERALS) TABS tablet Take 1 tablet by mouth every 7 (seven) days. Mondays  . omeprazole (PRILOSEC) 40 MG capsule Take 40 mg by mouth daily.  . ondansetron (ZOFRAN) 4 MG tablet Take 1 tablet (4 mg total) by mouth every 6 (six) hours as needed for nausea.  . phenazopyridine (PYRIDIUM) 100 MG tablet Take 1 tablet (100 mg total) by mouth 3 (three) times daily with meals.  . predniSONE (DELTASONE) 5 MG tablet prednisone  5 mg tablet  . senna-docusate (SENOKOT-S) 8.6-50 MG tablet Take 1 tablet by mouth at bedtime as needed for mild constipation.  . simvastatin (ZOCOR) 40 MG tablet Take 1 tablet (40 mg total) by mouth daily at 6 PM. Need appointment for refills  . tamsulosin (FLOMAX) 0.4 MG CAPS capsule Take 0.4 mg by mouth every evening.  . warfarin (COUMADIN) 2 MG tablet Take 1 tablet (2 mg total) by mouth daily. (Patient taking differently: Take 4 mg by mouth daily in the afternoon. Take 2 mg on Mondays. Take 4 mg the rest of the week)     Allergies:   Codeine   Social History   Tobacco Use  . Smoking status: Former Smoker    Quit date: 10/18/1992    Years since quitting: 27.7  . Smokeless tobacco: Never Used  Vaping Use  . Vaping Use: Never used  Substance Use Topics  . Alcohol use: No  . Drug use: No     Family Hx: The patient's family history includes CAD in his sister; CAD (age of onset: 9750) in his father; Liver cancer in his brother.  ROS:   Please see the history of present illness.     All other systems reviewed and are negative.   Prior CV studies:   The following studies were reviewed today:  Echo 07/09/2019 1. Left ventricular ejection fraction, by visual estimation, is 60 to  65%. The left ventricle has normal function. Normal left ventricular size.  There is no left ventricular hypertrophy.  2. The tricuspid valve is normal in structure. Tricuspid valve  regurgitation is trivial.  3. The aortic valve is tricuspid Aortic valve regurgitation was not  visualized by color flow Doppler. Moderate aortic valve stenosis. Mean  gradient 19 mmHg wtih AVA 1.15 cm^2.  4. Left ventricular diastolic Doppler parameters are consistent with  impaired relaxation pattern of LV diastolic filling.  5. Global right ventricle has normal systolic function.The right  ventricular size is normal. No increase in right ventricular wall  thickness.  6. Right atrial size was normal.  7. Left atrial  size was normal.  8. The inferior vena cava is normal in size with greater than 50%  respiratory variability, suggesting right atrial pressure of 3 mmHg.  9. The mitral valve is normal in structure. No evidence of mitral valve  regurgitation. No evidence of mitral stenosis.  10. The tricuspid regurgitant velocity is 2.90 m/s, and with an assumed  right atrial pressure of 3 mmHg, the estimated right ventricular systolic  pressure is mildly elevated at 36.6 mmHg.  Labs/Other Tests and Data Reviewed:    EKG:  An ECG dated 08/08/2019 was personally reviewed today and demonstrated:  Sinus tachycardia, poor R wave progression in the anterior leads.  Recent Labs: 08/06/2019: B Natriuretic Peptide 99.5 08/14/2019: ALT 15; BUN 18; Creatinine, Ser 0.88; Hemoglobin 13.3; Magnesium 1.6; Platelets 276; Potassium 4.2; Sodium 135   Recent Lipid Panel Lab Results  Component Value Date/Time   CHOL 71 08/07/2019 05:22 AM   CHOL 129 07/09/2019 10:14 AM   TRIG 119 08/07/2019 05:22 AM   HDL 27 (L) 08/07/2019 05:22 AM   HDL 30 (L) 07/09/2019 10:14 AM   CHOLHDL 2.6 08/07/2019 05:22 AM   LDLCALC 20 08/07/2019 05:22 AM   LDLCALC 64 07/09/2019 10:14 AM    Wt Readings from Last 3 Encounters:  07/18/20 230 lb (104.3 kg)  08/14/19 190 lb 9.6 oz (86.5 kg)  06/26/19 206 lb 4 oz (93.6 kg)     Objective:     Vital Signs:  Ht 6\' 1"  (1.854 m)   Wt 230 lb (104.3 kg)   BMI 30.34 kg/m    VITAL SIGNS:  reviewed  ASSESSMENT & PLAN:    1. Aortic valve stenosis: Previous echocardiogram obtained on 07/09/2019 demonstrated moderate aortic stenosis.  He will need a repeat echocardiogram prior to the next visit.  2. PAD: He does have left lower extremity weakness, however attributed this to his severe back pain.  No calf pain with walking.  Continue Pletal  3. Bilateral renal artery stenosis: Asymptomatic.  Will need to follow-up on blood pressure closely  4. Hypertension: Continue current  therapy  5. History of DVT/PE: Continue Coumadin therapy  6. Hyperlipidemia: Continue Zocor  7. Dyspnea on exertion: Chronic for the patient, stable recently.   COVID-19 Education: The signs and symptoms of COVID-19 were discussed with the patient and how to seek care for testing (follow up with PCP or arrange E-visit).  The importance of social distancing was discussed today.  Time:   Today, I have spent 9 minutes with the patient with telehealth technology discussing the above problems.     Medication Adjustments/Labs and Tests Ordered: Current medicines are reviewed at length with the patient today.  Concerns regarding medicines are outlined above.   Tests Ordered: Orders Placed This Encounter  Procedures  . ECHOCARDIOGRAM COMPLETE    Medication Changes: No orders of the defined types were placed in this encounter.   Follow Up:  In Person in 1 year(s)  Signed, 07/11/2019, Azalee Course  07/20/2020 8:28 PM     [facility]

## 2020-07-20 ENCOUNTER — Encounter: Payer: Self-pay | Admitting: Physician Assistant

## 2020-09-13 ENCOUNTER — Other Ambulatory Visit: Payer: Self-pay

## 2020-09-13 ENCOUNTER — Emergency Department (HOSPITAL_COMMUNITY): Payer: Medicare Other

## 2020-09-13 ENCOUNTER — Emergency Department (HOSPITAL_COMMUNITY)
Admission: EM | Admit: 2020-09-13 | Discharge: 2020-09-13 | Disposition: A | Discharge status: Home / Self Care | Payer: Medicare Other | Attending: Emergency Medicine | Admitting: Emergency Medicine

## 2020-09-13 ENCOUNTER — Encounter (HOSPITAL_COMMUNITY): Payer: Self-pay | Admitting: Emergency Medicine

## 2020-09-13 DIAGNOSIS — I714 Abdominal aortic aneurysm, without rupture, unspecified: Secondary | ICD-10-CM

## 2020-09-13 DIAGNOSIS — Z20822 Contact with and (suspected) exposure to covid-19: Secondary | ICD-10-CM | POA: Diagnosis not present

## 2020-09-13 DIAGNOSIS — R519 Headache, unspecified: Secondary | ICD-10-CM | POA: Diagnosis not present

## 2020-09-13 DIAGNOSIS — I10 Essential (primary) hypertension: Secondary | ICD-10-CM | POA: Diagnosis not present

## 2020-09-13 DIAGNOSIS — R911 Solitary pulmonary nodule: Secondary | ICD-10-CM | POA: Diagnosis not present

## 2020-09-13 DIAGNOSIS — J181 Lobar pneumonia, unspecified organism: Secondary | ICD-10-CM | POA: Insufficient documentation

## 2020-09-13 DIAGNOSIS — J189 Pneumonia, unspecified organism: Secondary | ICD-10-CM

## 2020-09-13 DIAGNOSIS — Z87891 Personal history of nicotine dependence: Secondary | ICD-10-CM | POA: Insufficient documentation

## 2020-09-13 DIAGNOSIS — J449 Chronic obstructive pulmonary disease, unspecified: Secondary | ICD-10-CM | POA: Insufficient documentation

## 2020-09-13 LAB — COMPREHENSIVE METABOLIC PANEL
ALT: 13 U/L (ref 0–44)
AST: 16 U/L (ref 15–41)
Albumin: 3.6 g/dL (ref 3.5–5.0)
Alkaline Phosphatase: 68 U/L (ref 38–126)
Anion gap: 14 (ref 5–15)
BUN: 20 mg/dL (ref 8–23)
CO2: 23 mmol/L (ref 22–32)
Calcium: 8.5 mg/dL — ABNORMAL LOW (ref 8.9–10.3)
Chloride: 98 mmol/L (ref 98–111)
Creatinine, Ser: 2.16 mg/dL — ABNORMAL HIGH (ref 0.61–1.24)
GFR, Estimated: 31 mL/min — ABNORMAL LOW (ref >60)
Glucose, Bld: 133 mg/dL — ABNORMAL HIGH (ref 70–99)
Potassium: 4.3 mmol/L (ref 3.5–5.1)
Sodium: 135 mmol/L (ref 135–145)
Total Bilirubin: 1.1 mg/dL (ref 0.3–1.2)
Total Protein: 7.2 g/dL (ref 6.5–8.1)

## 2020-09-13 LAB — RESP PANEL BY RT-PCR (FLU A&B, COVID) ARPGX2
Influenza A by PCR: NEGATIVE
Influenza B by PCR: NEGATIVE
SARS Coronavirus 2 by RT PCR: NEGATIVE

## 2020-09-13 LAB — I-STAT ARTERIAL BLOOD GAS, ED
Acid-base deficit: 1 mmol/L (ref 0.0–2.0)
Bicarbonate: 23.2 mmol/L (ref 20.0–28.0)
Calcium, Ion: 1.11 mmol/L — ABNORMAL LOW (ref 1.15–1.40)
HCT: 44 % (ref 39.0–52.0)
Hemoglobin: 15 g/dL (ref 13.0–17.0)
O2 Saturation: 89 %
Patient temperature: 98.6
Potassium: 4.1 mmol/L (ref 3.5–5.1)
Sodium: 137 mmol/L (ref 135–145)
TCO2: 24 mmol/L (ref 22–32)
pCO2 arterial: 37 mmHg (ref 32.0–48.0)
pH, Arterial: 7.405 (ref 7.350–7.450)
pO2, Arterial: 56 mmHg — ABNORMAL LOW (ref 83.0–108.0)

## 2020-09-13 LAB — CBC WITH DIFFERENTIAL/PLATELET
Abs Immature Granulocytes: 0.19 10*3/uL — ABNORMAL HIGH (ref 0.00–0.07)
Basophils Absolute: 0.1 10*3/uL (ref 0.0–0.1)
Basophils Relative: 1 %
Eosinophils Absolute: 0.1 10*3/uL (ref 0.0–0.5)
Eosinophils Relative: 1 %
HCT: 47.4 % (ref 39.0–52.0)
Hemoglobin: 15.7 g/dL (ref 13.0–17.0)
Immature Granulocytes: 2 %
Lymphocytes Relative: 8 %
Lymphs Abs: 0.8 10*3/uL (ref 0.7–4.0)
MCH: 31.6 pg (ref 26.0–34.0)
MCHC: 33.1 g/dL (ref 30.0–36.0)
MCV: 95.4 fL (ref 80.0–100.0)
Monocytes Absolute: 0.8 10*3/uL (ref 0.1–1.0)
Monocytes Relative: 7 %
Neutro Abs: 8.7 10*3/uL — ABNORMAL HIGH (ref 1.7–7.7)
Neutrophils Relative %: 81 %
Platelets: 217 10*3/uL (ref 150–400)
RBC: 4.97 MIL/uL (ref 4.22–5.81)
RDW: 14.8 % (ref 11.5–15.5)
WBC: 10.6 10*3/uL — ABNORMAL HIGH (ref 4.0–10.5)
nRBC: 0 % (ref 0.0–0.2)

## 2020-09-13 LAB — CARBOXYHEMOGLOBIN - COOX: Carboxyhemoglobin: 1.6 % — ABNORMAL HIGH (ref 0.5–1.5)

## 2020-09-13 LAB — PROTIME-INR
INR: 2.1 — ABNORMAL HIGH (ref 0.8–1.2)
Prothrombin Time: 22.7 seconds — ABNORMAL HIGH (ref 11.4–15.2)

## 2020-09-13 MED ORDER — SODIUM CHLORIDE 0.9 % IV SOLN
1.0000 g | Freq: Once | INTRAVENOUS | Status: AC
  Administered 2020-09-13: 1 g via INTRAVENOUS
  Filled 2020-09-13: qty 10

## 2020-09-13 MED ORDER — SODIUM CHLORIDE 0.9 % IV SOLN
500.0000 mg | Freq: Once | INTRAVENOUS | Status: AC
  Administered 2020-09-13: 500 mg via INTRAVENOUS
  Filled 2020-09-13: qty 500

## 2020-09-13 MED ORDER — CEFPODOXIME PROXETIL 100 MG PO TABS
100.0000 mg | ORAL_TABLET | Freq: Two times a day (BID) | ORAL | 0 refills | Status: DC
Start: 2020-09-13 — End: 2022-02-21

## 2020-09-13 MED ORDER — SODIUM CHLORIDE 0.9 % IV BOLUS
500.0000 mL | Freq: Once | INTRAVENOUS | Status: AC
  Administered 2020-09-13: 500 mL via INTRAVENOUS

## 2020-09-13 MED ORDER — IOHEXOL 350 MG/ML SOLN
60.0000 mL | Freq: Once | INTRAVENOUS | Status: AC | PRN
  Administered 2020-09-13: 60 mL via INTRAVENOUS

## 2020-09-13 MED ORDER — PROCHLORPERAZINE EDISYLATE 10 MG/2ML IJ SOLN
5.0000 mg | INTRAMUSCULAR | Status: AC
  Administered 2020-09-13: 5 mg via INTRAVENOUS
  Filled 2020-09-13: qty 2

## 2020-09-13 MED ORDER — DIPHENHYDRAMINE HCL 50 MG/ML IJ SOLN
25.0000 mg | Freq: Once | INTRAMUSCULAR | Status: AC
  Administered 2020-09-13: 25 mg via INTRAVENOUS
  Filled 2020-09-13: qty 1

## 2020-09-13 MED ORDER — FUROSEMIDE 10 MG/ML IJ SOLN
40.0000 mg | Freq: Once | INTRAMUSCULAR | Status: AC
  Administered 2020-09-13: 40 mg via INTRAVENOUS
  Filled 2020-09-13: qty 4

## 2020-09-13 NOTE — ED Triage Notes (Signed)
BIB [facility] from home. Pt has had a headache for 2 days. Movement and light makes it worse. Complaint of nausea. EMS reported house had a very strong kerosine smell. Pt was hypertensive upon arrival.   159/104 98 HR 93% CBG 137

## 2020-09-13 NOTE — ED Provider Notes (Signed)
[facility] EMERGENCY DEPARTMENT Provider Note   CSN: [id number] Arrival date & time:        History Chief Complaint  Patient presents with  . Headache    Tony Zhang is a 74 y.o. male.  HPI  74 yo male presents today with cc of headache.  Reports headache began 2 days ago and is in the right frontal area radiating back.  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  He reports that he has had some nasal congestion, sore throat, and cough.  His partner had Covid 2 weeks ago.  The patient himself was vaccinated with [facility].  Per nursing notes and initial physician in the scene, patient was hypoxic on arrival with sats at 88% was placed on 6 L nasal cannula.     Past Medical History:  Diagnosis Date  . AAA (abdominal aortic aneurysm) (HCC)   . 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    [name] COPD (chronic obstructive pulmonary disease) (HCC)   . DJD (degenerative joint disease)   . Dyslipidemia   . Gout   . Malignant hypertension   . Peripheral vascular disease (HCC)   . Pneumonia 08/2013   pt. reports that he is having this surgery 11/25/2014- for the lung problem that began with pneumonia in 09/2014  . Shortness of breath   . Venous thrombosis    Recurrent    Patient Active Problem List   Diagnosis Date Noted  . Sepsis due to pneumonia (HCC) 08/06/2019  . Pressure injury of skin 08/06/2019  . Acute respiratory failure with hypoxia (HCC)   . Demand ischemia (HCC)   . Small bowel obstruction (HCC) 11/03/2018  . History of pulmonary embolism 02/09/2016  . Acute GI bleeding   . Anemia due to blood loss, acute   . GI bleed 04/08/2015  . GIB (gastrointestinal bleeding) 04/08/2015  . AKI (acute kidney injury) (HCC) 04/08/2015  . Empyema lung (HCC)   . Loculated pleural effusion   . Pneumothorax   . Empyema, right (HCC) 11/25/2014  . Persistent right pleural  effusion and right lower lobe infiltrate 11/14/2014  . Multifocal pneumonia 10/10/2014  . Peripheral vascular disease (HCC) 10/10/2014  . Anticoagulated on Coumadin 10/10/2014  . Supratherapeutic INR 10/10/2014  . Microcytic anemia 09/18/2013  . CAP (community acquired pneumonia) 09/09/2013  . AAA (abdominal aortic aneurysm) (HCC) 08/19/2013  . 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 Malignant HTN with heart disease, w/o CHF, with chronic kidney disease 08/19/2013  . DVT, lower extremity and pulmonary embolism, recurrent 08/19/2013  . Hyperlipidemia 08/19/2013  . Gout 08/19/2013  . COPD (chronic obstructive pulmonary disease) (HCC) 08/19/2013  . OSA (obstructive sleep apnea) 08/19/2013  . Abnormal nuclear stress test 08/19/2013  . Bilateral renal artery stenosis (HCC) 08/19/2013  . DVT (deep venous thrombosis) (HCC) 06/28/2012    Past Surgical History:  Procedure Laterality Date  . APPENDECTOMY    . BACK SURGERY    . COLONOSCOPY N/A 04/13/2015   Procedure: COLONOSCOPY;  Surgeon: Carman Ching, MD;  Location: [facility] ENDOSCOPY;  Service: Endoscopy;  Laterality: N/A;  . EMPYEMA DRAINAGE N/A 11/25/2014   Procedure: EMPYEMA DRAINAGE;  Surgeon: Delight Ovens, MD;  Location: [facility] OR;  Service: Thoracic;  Laterality: N/A;  . ESOPHAGOGASTRODUODENOSCOPY N/A 04/10/2015   Procedure: ESOPHAGOGASTRODUODENOSCOPY (EGD);  Surgeon: Charlott Rakes, MD;  Location: [facility] ENDOSCOPY;  Service: Endoscopy;  Laterality: N/A;  . EYE SURGERY     both eyes, cataracts removed, denies lens implants  . FLEXIBLE SIGMOIDOSCOPY N/A 04/12/2015   Procedure: FLEXIBLE SIGMOIDOSCOPY;  Surgeon: Carman Ching, MD;  Location: [facility] ENDOSCOPY;  Service: Endoscopy;  Laterality: N/A;  . HERNIA REPAIR    . SHOULDER SURGERY Right   . VIDEO ASSISTED THORACOSCOPY Right 11/25/2014   Procedure: VIDEO ASSISTED THORACOSCOPY;  Surgeon: Delight Ovens, MD;  Location: [facility] OR;  Service: Thoracic;  Laterality: Right;  [name] VIDEO  BRONCHOSCOPY N/A 11/25/2014   Procedure: VIDEO BRONCHOSCOPY;  Surgeon: Delight Ovens, MD;  Location: [facility] OR;  Service: Thoracic;  Laterality: N/A;       Family History  Problem Relation Age of Onset  . CAD Father 14  . Liver cancer Brother   . CAD Sister     Social History   Tobacco Use  . Smoking status: Former Smoker    Quit date: 10/18/1992    Years since quitting: 27.9  . Smokeless tobacco: Never Used  Vaping Use  . Vaping Use: Never used  Substance Use Topics  . Alcohol use: No  . Drug use: No    Home Medications Prior to Admission medications   Medication Sig Start Date End Date Taking? Authorizing Provider  acetaminophen (TYLENOL) 650 MG CR tablet Take 650 mg by mouth every 8 (eight) hours as needed for pain.    [provider]  albuterol (VENTOLIN HFA) 108 (90 Base) MCG/ACT inhaler Inhale 2 puffs into the lungs every 6 (six) hours as needed for wheezing or shortness of breath.    [provider]  allopurinol (ZYLOPRIM) 300 MG tablet Take 300 mg by mouth daily.    [provider]  cilostazol (PLETAL) 100 MG tablet Take 1 tablet (100 mg total) by mouth 2 (two) times daily. Please schedule appointment for refills. 03/07/17   Croitoru, Mihai, MD  cyclobenzaprine (FLEXERIL) 10 MG tablet Take 10 mg by mouth 3 (three) times daily as needed for muscle spasms.    [provider]  diazepam (VALIUM) 2 MG tablet Take 2 mg by mouth every evening.    [provider]  famotidine (PEPCID) 20 MG tablet Take 1 tablet (20 mg total) by mouth daily. 08/14/19   Sheikh, Omair Latif, DO  fenofibrate 160 MG tablet fenofibrate 160 mg tablet    [provider]  fluticasone (FLONASE) 50 MCG/ACT nasal spray fluticasone propionate 50 mcg/actuation nasal spray,suspension    [provider]  guaiFENesin (MUCINEX) 600 MG 12 hr tablet Take 1 tablet (600 mg total) by mouth 2 (two) times daily. 08/14/19   Marguerita Merles Latif, DO    hydrochlorothiazide (HYDRODIURIL) 25 MG tablet hydrochlorothiazide 25 mg tablet    [provider]  HYDROcodone-acetaminophen (NORCO) 10-325 MG tablet Take 1 tablet by mouth every 4 (four) hours.    [provider]  ipratropium-albuterol (DUONEB) 0.5-2.5 (3) MG/3ML SOLN Take 3 mLs by nebulization every 6 (six) hours as needed. FOR COPD J44.9 and Pneumonia J18.9 08/14/19   Marguerita Merles Latif, DO  iron polysaccharides (NIFEREX) 150 MG capsule Take 1 capsule (150 mg total) by mouth daily. 08/15/19   Sheikh, Kateri Mc Latif, DO  lisinopril (ZESTRIL) 10 MG tablet Take 10 mg by mouth daily.    [provider]  metoprolol succinate (TOPROL-XL) 100 MG 24 hr tablet metoprolol succinate ER 100 mg tablet,extended release 24 hr    [provider]  Multiple Vitamin (MULTIVITAMIN WITH MINERALS) TABS tablet Take  1 tablet by mouth every 7 (seven) days. Mondays    [provider]  omeprazole (PRILOSEC) 40 MG capsule Take 40 mg by mouth daily.    [provider]  ondansetron (ZOFRAN) 4 MG tablet Take 1 tablet (4 mg total) by mouth every 6 (six) hours as needed for nausea. 08/14/19   Marguerita MerlesSheikh, Omair Latif, DO  phenazopyridine (PYRIDIUM) 100 MG tablet Take 1 tablet (100 mg total) by mouth 3 (three) times daily with meals. 08/14/19   Sheikh, Omair Latif, DO  predniSONE (DELTASONE) 5 MG tablet prednisone 5 mg tablet    [provider]  senna-docusate (SENOKOT-S) 8.6-50 MG tablet Take 1 tablet by mouth at bedtime as needed for mild constipation. 08/14/19   Marguerita MerlesSheikh, Omair Latif, DO  simvastatin (ZOCOR) 40 MG tablet Take 1 tablet (40 mg total) by mouth daily at 6 PM. Need appointment for refills 05/10/17   Croitoru, Mihai, MD  tamsulosin (FLOMAX) 0.4 MG CAPS capsule Take 0.4 mg by mouth every evening.    [provider]  warfarin (COUMADIN) 2 MG tablet Take 1 tablet (2 mg total) by mouth daily. Patient taking differently: Take 4 mg by mouth daily in the  afternoon. Take 2 mg on Mondays. Take 4 mg the rest of the week 08/14/15   Ladene ArtistSherrill, Gary B, MD    Allergies    Codeine  Review of Systems   Review of Systems  All other systems reviewed and are negative.   Physical Exam Updated Vital Signs BP (!) 179/82   Pulse (!) 102   Temp 97.8 F (36.6 C) (Oral)   Resp (!) 21   Ht 1.854 m (6\' 1" )   Wt 104.8 kg   SpO2 98%   BMI 30.48 kg/m   Physical Exam Vitals and nursing note reviewed.  Constitutional:      Appearance: He is well-developed.  HENT:     Head: Normocephalic.     Mouth/Throat:     Mouth: Mucous membranes are moist.  Eyes:     Extraocular Movements: Extraocular movements intact.     Pupils: Pupils are equal, round, and reactive to light.  Cardiovascular:     Rate and Rhythm: Normal rate and regular rhythm.     Heart sounds: Normal heart sounds.  Pulmonary:     Effort: Pulmonary effort is normal.     Breath sounds: Normal breath sounds.  Abdominal:     Palpations: Abdomen is soft.  Musculoskeletal:        General: Normal range of motion.     Cervical back: Normal range of motion and neck supple.  Skin:    General: Skin is warm and dry.  Neurological:     Mental Status: He is alert. Mental status is at baseline.     Cranial Nerves: No cranial nerve deficit or facial asymmetry.     Sensory: No sensory deficit.     Motor: No weakness.     Deep Tendon Reflexes: Reflexes normal.  Psychiatric:        Mood and Affect: Mood normal.        Behavior: Behavior normal.     ED Results / Procedures / Treatments   Labs (all labs ordered are listed, but only abnormal results are displayed) Labs Reviewed  RESP PANEL BY RT-PCR (FLU A&B, COVID) ARPGX2  CBC WITH DIFFERENTIAL/PLATELET  COMPREHENSIVE METABOLIC PANEL  PROTIME-INR  CARBOXYHEMOGLOBIN - COOX    EKG EKG Interpretation  Date/Time:  Saturday September 13 2020 06:45:41 EST Ventricular Rate:  103 PR Interval:    QRS Duration: 88 QT Interval:  346 QTC  Calculation: 453 R Axis:   -83 Text Interpretation: Sinus tachycardia Anterolateral infarct, old Baseline wander in lead(s) V6 Non-specific intra-ventricular conduction delay Confirmed by Rochele Raring [phone]) on 09/13/2020 6:47:50 AM   Radiology CT Head Wo Contrast  Result Date: 09/13/2020 CLINICAL DATA:  Evaluate for intracranial hemorrhage. Headache for 2 days. EXAM: CT HEAD WITHOUT CONTRAST TECHNIQUE: Contiguous axial images were obtained from the base of the skull through the vertex without intravenous contrast. COMPARISON:  03/05/2018 FINDINGS: Brain: No evidence of acute infarction, hemorrhage, hydrocephalus, extra-axial collection or mass lesion/mass effect. There is mild diffuse low-attenuation within the subcortical and periventricular white matter compatible with chronic microvascular disease. Prominence of the sulci identified compatible with brain atrophy. Vascular: No hyperdense vessel or unexpected calcification. Skull: Normal. Negative for fracture or focal lesion. Sinuses/Orbits: No acute finding. Other: None IMPRESSION: 1. No acute intracranial abnormalities. 2. Chronic small vessel ischemic change and brain atrophy. Electronically Signed   By: Signa Kell M.D.   On: 09/13/2020 08:04   CT Angio Chest PE W and/or Wo Contrast  Result Date: 09/13/2020 CLINICAL DATA:  Shortness of breath and fever EXAM: CT ANGIOGRAPHY CHEST WITH CONTRAST TECHNIQUE: Multidetector CT imaging of the chest was performed using the standard protocol during bolus administration of intravenous contrast. Multiplanar CT image reconstructions and MIPs were obtained to evaluate the vascular anatomy. CONTRAST:  71mL OMNIPAQUE IOHEXOL 350 MG/ML SOLN COMPARISON:  Chest radiograph September 13, 2020; chest CT March 25, 2015 FINDINGS: Cardiovascular: There is no demonstrable pulmonary embolus. There is no ascending thoracic aortic aneurysm. The measured diameter of the ascending thoracic aorta measures 3.9 x 3.9 cm. There  is dilatation in the aortic arch region with a transverse diameter of 3.9 cm, stable. There are multiple foci of great vessel calcification. There are multiple foci of aortic atherosclerosis. There are multiple foci 1 of coronary artery calcification. There is no appreciable pericardial effusion or pericardial thickening. Mediastinum/Nodes: Thyroid appears unremarkable. There are subcentimeter mediastinal lymph nodes, stable from 2016. No frank adenopathy by size criteria. No esophageal lesions are evident. Lungs/Pleura: Underlying centrilobular emphysematous change noted. Stable scarring in the right upper lobe posteriorly. There is scarring and atelectatic change medially in the anterior segment of the right upper lobe. There is scarring and atelectatic change in the left lower lobe. Superimposed area of suspected pneumonia in the lateral segment left lower lobe noted. Mild atelectatic change noted in the right lower lobe with overall less opacity in this area compared to the 2016 study. No pleural effusions are appreciable. There is a mild degree of lower lobe bronchiectatic change, somewhat more on the left than on the right. There is a nodular opacity in the anterior segment of the right upper lobe measuring 7 x 6 mm, seen on axial slice 77 series 7. Upper Abdomen: There is upper abdominal aortic and major mesenteric arterial vascular calcification. There is an apparent cyst in the right lobe of the liver medially measuring a 1.6 x 1.2 cm. Visualized upper abdominal structures otherwise appear unremarkable. Musculoskeletal: No blastic or lytic bone lesions. No evident chest wall lesions. Review of the MIP images confirms the above findings. IMPRESSION: 1.  No evident pulmonary embolus. 2. Measured diameter in the aortic arch region measures 3.9 cm, essentially stable. Recommend annual imaging followup by CTA or MRA. This recommendation follows 2010 ACCF/AHA/AATS/ACR/ASA/SCA/SCAI/SIR/STS/SVM Guidelines for the  Diagnosis and Management of Patients with Thoracic Aortic Disease. Circulation.2010;  121: [id number]. Aortic aneurysm NOS (ICD10-I71.9). Maximum ascending thoracic aortic diameter measures 3.9 cm. No evident dissection. There is extensive great vessel and aortic atherosclerosis as well as foci of coronary artery calcification. 3. Underlying centrilobular emphysematous change. Areas of scarring and atelectasis noted. Suspect small focus of pneumonia in the lateral segment left lower lobe. Lower lobe bronchiectatic change noted. 4. 7 x 6 mm nodular opacity right upper lobe. Non-contrast chest CT at 6-12 months is recommended. If the nodule is stable at time of repeat CT, then future CT at 18-24 months (from today's scan) is considered optional for low-risk patients, but is recommended for high-risk patients. This recommendation follows the consensus statement: Guidelines for Management of Incidental Pulmonary Nodules Detected on CT Images: From the Fleischner Society 2017; Radiology 2017; 284:228-243. 5.  No appreciable adenopathy by size criteria. Aortic Atherosclerosis (ICD10-I70.0) and Emphysema (ICD10-J43.9). Electronically Signed   By: Bretta Bang III M.D.   On: 09/13/2020 14:30   DG Chest Portable 1 View  Result Date: 09/13/2020 CLINICAL DATA:  Shortness of breath, hypoxia. EXAM: PORTABLE CHEST 1 VIEW COMPARISON:  06/24/2020. FINDINGS: Suspected small bilateral pleural effusions. Chronic architectural distortion in right upper lung. Linear bibasilar opacities, similar to prior. No confluent consolidation. No visible pneumothorax. Stable cardiomediastinal silhouette. IMPRESSION: Suspected small bilateral pleural effusions. Linear bibasilar opacities, favored atelectasis/scarring and similar to prior. No confluent consolidation. Electronically Signed   By: Feliberto Harts MD   On: 09/13/2020 07:26    Procedures Procedures (including critical care time)  Medications Ordered in ED Medications - No  data to display  ED Course  I have reviewed the triage vital signs and the nursing notes.  Pertinent labs & imaging results that were available during my care of the patient were reviewed by me and considered in my medical decision making (see chart for details).  Clinical Course as of Sep 14 911  Sat Sep 13, 2020  0175 Reviewed cbc and resp panel  MCV: 95.4 [DR]  0848 CT head and chest x-Johan Antonacci reviewed and interpretation by radiologist reviewed   [DR]  0907 Reviewed cmet, head ct, no evidence of bleeding .  Will proceed with ha treatment compazine and benadryl.     [DR]    Clinical Course User Index [DR] Margarita Grizzle, MD   MDM Rules/Calculators/A&P                         Patient with headache on right and anticoagulated CT without evidence of bleeding. BP slightly elevated with known hypertension CKD and increased creatinine Plan symptomatic tx of headache IV fluid bolus and op f/u for recheck of creatinine No acute electrolyte abnormality noted Carboxyhemoglobin pending Pulse ox has been extremely variable.  ABG obtained and PO2 56.  CT obtained no evidence of PE but bullous emphysema and possible small infiltrate.  Secondary to this, patient is given Rocephin and Zithromax for possible community-acquired pneumonia.  He will be discharged home on the second.  Patient was ambulated in the hallway.  He had intermittently poor waveform but sats were 95-96 when it was picking up and dropped into the upper 80s when it was not picking up as well.  Patient states he is having his normal gait and normal respiratory state.  He appears to be stable for discharge.  Discussed that he is having symptoms of dyspnea he should return.  Patient voices understanding of return precautions and need for follow-up. Final Clinical Impression(s) / ED Diagnoses Final  diagnoses:  Acute nonintractable headache, unspecified headache type  Community acquired pneumonia of left lower lobe of lung  Abdominal  aortic aneurysm (AAA) without rupture [facility])  Pulmonary nodule    Rx / DC Orders ED Discharge Orders    None       Margarita Grizzle, MD 09/13/20 1541

## 2020-09-13 NOTE — ED Notes (Signed)
Pt O2 at 88% room air. Placed on 4L [place]

## 2020-09-13 NOTE — ED Notes (Signed)
Pt states roommate tested positive for covid a couple of weeks ago.

## 2020-09-13 NOTE — ED Notes (Signed)
Pt ambulated in hallway with spo2, pt tolerated with minimal issues

## 2020-09-13 NOTE — ED Provider Notes (Signed)
MSE was initiated and I personally evaluated the patient and placed orders (if any) at  6:55 AM on September 13, 2020.  The patient appears stable so that the remainder of the MSE may be completed by another provider.   74 year old male here with headache, subjective fevers, nausea, decreased appetite, shortness of breath and hypoxia.  Does not wear oxygen chronically.  No history of asthma or COPD.  Roommate diagnosed with COVID-19 2 weeks ago.  Patient states he has had a [employer] Covid vaccine.  Currently on 6 L nasal cannula.  Was hypoxic on room air.  Labs, chest x-ray, Covid swab ordered.  He denies any chest pain or chest discomfort.  EKG shows no ischemic abnormality compared to prior.   EKG Interpretation  Date/Time:  Saturday September 13 2020 06:45:41 EST Ventricular Rate:  103 PR Interval:    QRS Duration: 88 QT Interval:  346 QTC Calculation: 453 R Axis:   -83 Text Interpretation: Sinus tachycardia Anterolateral infarct, old Baseline wander in lead(s) V6 Non-specific intra-ventricular conduction delay Confirmed by Lalia Loudon, Baxter Hire [phone]) on 09/13/2020 6:47:50 AM         Ehab Humber, Layla Maw, DO 09/13/20 9147

## 2020-09-13 NOTE — Progress Notes (Signed)
Orthopedic Tech Progress Note Patient Details:  Tony Zhang 1946-02-11 [id number] Level 2 Trauma. Not needed Patient ID: Tony Zhang, male   DOB: 1946-09-05, 74 y.o.   MRN: [id number]   Tony Zhang 09/13/2020, 11:10 AM

## 2020-09-13 NOTE — ED Notes (Signed)
Respiratory aware of ABG

## 2020-09-13 NOTE — Discharge Instructions (Addendum)
There is no evidence of abnormality in your head scan Your oxygen level is somewhat low.  It appears he may have a small area of pneumonia.  You have been treated here with IV antibiotics are given a prescription for antibiotics.  On your CAT scan of your chest it was noted that you have an abdominal aortic aneurysm which appears stable from prior but will need to be followed.  There is also a pulmonary nodule present which will need to be followed.  Please inform your doctor of these findings.  Take all antibiotics as prescribed Return to the emergency department you have new or worsening pain in your abdomen, chest, or head.

## 2021-01-16 ENCOUNTER — Other Ambulatory Visit (HOSPITAL_COMMUNITY): Payer: Medicare Other

## 2021-01-19 ENCOUNTER — Encounter (HOSPITAL_COMMUNITY): Payer: Self-pay | Admitting: Physician Assistant

## 2021-01-27 ENCOUNTER — Telehealth (HOSPITAL_COMMUNITY): Payer: Self-pay | Admitting: Physician Assistant

## 2021-01-27 NOTE — Telephone Encounter (Signed)
Just an FYI. We have made several attempts to contact this patient including sending a letter to schedule or reschedule their echocardiogram. We will be removing the patient from the echo WQ.   01/16/21 NO SHOWED-MAILED LETTER [facility]     Thank you

## 2021-04-21 ENCOUNTER — Ambulatory Visit
Admission: RE | Admit: 2021-04-21 | Discharge: 2021-04-21 | Disposition: A | Discharge status: Home / Self Care | Payer: Medicare Other | Source: Ambulatory Visit | Attending: Family Medicine | Admitting: Family Medicine

## 2021-04-21 ENCOUNTER — Other Ambulatory Visit: Payer: Self-pay | Admitting: Family Medicine

## 2021-04-21 ENCOUNTER — Other Ambulatory Visit: Payer: Self-pay

## 2021-04-21 DIAGNOSIS — R053 Chronic cough: Secondary | ICD-10-CM

## 2022-02-21 ENCOUNTER — Other Ambulatory Visit: Payer: Self-pay

## 2022-02-21 ENCOUNTER — Inpatient Hospital Stay (HOSPITAL_COMMUNITY): Payer: Medicare Other

## 2022-02-21 ENCOUNTER — Emergency Department (HOSPITAL_COMMUNITY): Payer: Medicare Other

## 2022-02-21 ENCOUNTER — Encounter (HOSPITAL_COMMUNITY): Payer: Self-pay | Admitting: Emergency Medicine

## 2022-02-21 ENCOUNTER — Inpatient Hospital Stay (HOSPITAL_COMMUNITY)
Admission: EM | Admit: 2022-02-21 | Discharge: 2022-02-23 | DRG: 872 | Disposition: A | Discharge status: Home / Self Care | Payer: Medicare Other | Attending: Internal Medicine | Admitting: Internal Medicine

## 2022-02-21 DIAGNOSIS — E785 Hyperlipidemia, unspecified: Secondary | ICD-10-CM | POA: Diagnosis present

## 2022-02-21 DIAGNOSIS — N179 Acute kidney failure, unspecified: Secondary | ICD-10-CM | POA: Diagnosis present

## 2022-02-21 DIAGNOSIS — I739 Peripheral vascular disease, unspecified: Secondary | ICD-10-CM | POA: Diagnosis present

## 2022-02-21 DIAGNOSIS — L03115 Cellulitis of right lower limb: Secondary | ICD-10-CM | POA: Diagnosis present

## 2022-02-21 DIAGNOSIS — Z20822 Contact with and (suspected) exposure to covid-19: Secondary | ICD-10-CM | POA: Diagnosis present

## 2022-02-21 DIAGNOSIS — I350 Nonrheumatic aortic (valve) stenosis: Secondary | ICD-10-CM | POA: Diagnosis present

## 2022-02-21 DIAGNOSIS — G8929 Other chronic pain: Secondary | ICD-10-CM | POA: Diagnosis present

## 2022-02-21 DIAGNOSIS — Z800 Family history of malignant neoplasm of digestive organs: Secondary | ICD-10-CM | POA: Diagnosis not present

## 2022-02-21 DIAGNOSIS — L0390 Cellulitis, unspecified: Secondary | ICD-10-CM | POA: Diagnosis not present

## 2022-02-21 DIAGNOSIS — L03116 Cellulitis of left lower limb: Secondary | ICD-10-CM | POA: Diagnosis present

## 2022-02-21 DIAGNOSIS — I712 Thoracic aortic aneurysm, without rupture, unspecified: Secondary | ICD-10-CM | POA: Diagnosis present

## 2022-02-21 DIAGNOSIS — E860 Dehydration: Secondary | ICD-10-CM | POA: Diagnosis present

## 2022-02-21 DIAGNOSIS — I714 Abdominal aortic aneurysm, without rupture, unspecified: Secondary | ICD-10-CM | POA: Diagnosis present

## 2022-02-21 DIAGNOSIS — Z8249 Family history of ischemic heart disease and other diseases of the circulatory system: Secondary | ICD-10-CM

## 2022-02-21 DIAGNOSIS — W458XXA Other foreign body or object entering through skin, initial encounter: Secondary | ICD-10-CM | POA: Diagnosis present

## 2022-02-21 DIAGNOSIS — S90852A Superficial foreign body, left foot, initial encounter: Secondary | ICD-10-CM | POA: Diagnosis present

## 2022-02-21 DIAGNOSIS — K219 Gastro-esophageal reflux disease without esophagitis: Secondary | ICD-10-CM | POA: Diagnosis present

## 2022-02-21 DIAGNOSIS — S90851A Superficial foreign body, right foot, initial encounter: Secondary | ICD-10-CM | POA: Diagnosis present

## 2022-02-21 DIAGNOSIS — N400 Enlarged prostate without lower urinary tract symptoms: Secondary | ICD-10-CM | POA: Diagnosis present

## 2022-02-21 DIAGNOSIS — I701 Atherosclerosis of renal artery: Secondary | ICD-10-CM | POA: Diagnosis present

## 2022-02-21 DIAGNOSIS — Z87891 Personal history of nicotine dependence: Secondary | ICD-10-CM

## 2022-02-21 DIAGNOSIS — W450XXA Nail entering through skin, initial encounter: Secondary | ICD-10-CM | POA: Diagnosis not present

## 2022-02-21 DIAGNOSIS — Z86718 Personal history of other venous thrombosis and embolism: Secondary | ICD-10-CM

## 2022-02-21 DIAGNOSIS — A419 Sepsis, unspecified organism: Secondary | ICD-10-CM | POA: Diagnosis present

## 2022-02-21 DIAGNOSIS — R6520 Severe sepsis without septic shock: Secondary | ICD-10-CM | POA: Diagnosis present

## 2022-02-21 DIAGNOSIS — I10 Essential (primary) hypertension: Secondary | ICD-10-CM | POA: Diagnosis present

## 2022-02-21 DIAGNOSIS — Z86711 Personal history of pulmonary embolism: Secondary | ICD-10-CM | POA: Diagnosis present

## 2022-02-21 DIAGNOSIS — L089 Local infection of the skin and subcutaneous tissue, unspecified: Secondary | ICD-10-CM | POA: Diagnosis present

## 2022-02-21 DIAGNOSIS — R0609 Other forms of dyspnea: Secondary | ICD-10-CM | POA: Diagnosis not present

## 2022-02-21 LAB — URINALYSIS, ROUTINE W REFLEX MICROSCOPIC
Bilirubin Urine: NEGATIVE
Glucose, UA: NEGATIVE mg/dL
Hgb urine dipstick: NEGATIVE
Ketones, ur: NEGATIVE mg/dL
Leukocytes,Ua: NEGATIVE
Nitrite: NEGATIVE
Protein, ur: NEGATIVE mg/dL
Specific Gravity, Urine: 1.015 (ref 1.005–1.030)
pH: 5 (ref 5.0–8.0)

## 2022-02-21 LAB — COMPREHENSIVE METABOLIC PANEL
ALT: 12 U/L (ref 0–44)
AST: 19 U/L (ref 15–41)
Albumin: 4 g/dL (ref 3.5–5.0)
Alkaline Phosphatase: 88 U/L (ref 38–126)
Anion gap: 12 (ref 5–15)
BUN: 33 mg/dL — ABNORMAL HIGH (ref 8–23)
CO2: 14 mmol/L — ABNORMAL LOW (ref 22–32)
Calcium: 9.8 mg/dL (ref 8.9–10.3)
Chloride: 107 mmol/L (ref 98–111)
Creatinine, Ser: 2.5 mg/dL — ABNORMAL HIGH (ref 0.61–1.24)
GFR, Estimated: 26 mL/min — ABNORMAL LOW (ref >60)
Glucose, Bld: 128 mg/dL — ABNORMAL HIGH (ref 70–99)
Potassium: 5.4 mmol/L — ABNORMAL HIGH (ref 3.5–5.1)
Sodium: 133 mmol/L — ABNORMAL LOW (ref 135–145)
Total Bilirubin: 0.9 mg/dL (ref 0.3–1.2)
Total Protein: 7.9 g/dL (ref 6.5–8.1)

## 2022-02-21 LAB — CBC WITH DIFFERENTIAL/PLATELET
Abs Immature Granulocytes: 0.25 10*3/uL — ABNORMAL HIGH (ref 0.00–0.07)
Basophils Absolute: 0.2 10*3/uL — ABNORMAL HIGH (ref 0.0–0.1)
Basophils Relative: 2 %
Eosinophils Absolute: 0.2 10*3/uL (ref 0.0–0.5)
Eosinophils Relative: 2 %
HCT: 46.2 % (ref 39.0–52.0)
Hemoglobin: 15.3 g/dL (ref 13.0–17.0)
Immature Granulocytes: 2 %
Lymphocytes Relative: 13 %
Lymphs Abs: 1.5 10*3/uL (ref 0.7–4.0)
MCH: 31.9 pg (ref 26.0–34.0)
MCHC: 33.1 g/dL (ref 30.0–36.0)
MCV: 96.5 fL (ref 80.0–100.0)
Monocytes Absolute: 0.9 10*3/uL (ref 0.1–1.0)
Monocytes Relative: 8 %
Neutro Abs: 8 10*3/uL — ABNORMAL HIGH (ref 1.7–7.7)
Neutrophils Relative %: 73 %
Platelets: 374 10*3/uL (ref 150–400)
RBC: 4.79 MIL/uL (ref 4.22–5.81)
RDW: 16.7 % — ABNORMAL HIGH (ref 11.5–15.5)
WBC: 11 10*3/uL — ABNORMAL HIGH (ref 4.0–10.5)
nRBC: 0 % (ref 0.0–0.2)

## 2022-02-21 LAB — RESP PANEL BY RT-PCR (FLU A&B, COVID) ARPGX2
Influenza A by PCR: NEGATIVE
Influenza B by PCR: NEGATIVE
SARS Coronavirus 2 by RT PCR: NEGATIVE

## 2022-02-21 LAB — LACTIC ACID, PLASMA
Lactic Acid, Venous: 1.8 mmol/L (ref 0.5–1.9)
Lactic Acid, Venous: 1.6 mmol/L (ref 0.5–1.9)

## 2022-02-21 LAB — TROPONIN I (HIGH SENSITIVITY)
Troponin I (High Sensitivity): 56 ng/L — ABNORMAL HIGH (ref <18)
Troponin I (High Sensitivity): 48 ng/L — ABNORMAL HIGH (ref <18)

## 2022-02-21 LAB — APTT: aPTT: 34 seconds (ref 24–36)

## 2022-02-21 LAB — HEMOGLOBIN A1C
Hgb A1c MFr Bld: 5.6 % (ref 4.8–5.6)
Mean Plasma Glucose: 114.02 mg/dL

## 2022-02-21 LAB — BRAIN NATRIURETIC PEPTIDE: B Natriuretic Peptide: 25.2 pg/mL (ref 0.0–100.0)

## 2022-02-21 LAB — GLUCOSE, CAPILLARY: Glucose-Capillary: 106 mg/dL — ABNORMAL HIGH (ref 70–99)

## 2022-02-21 LAB — PROTIME-INR
INR: 1.5 — ABNORMAL HIGH (ref 0.8–1.2)
Prothrombin Time: 17.6 seconds — ABNORMAL HIGH (ref 11.4–15.2)

## 2022-02-21 LAB — TSH: TSH: 2.145 u[IU]/mL (ref 0.350–4.500)

## 2022-02-21 MED ORDER — TAMSULOSIN HCL 0.4 MG PO CAPS
0.4000 mg | ORAL_CAPSULE | Freq: Every evening | ORAL | Status: DC
  Administered 2022-02-21 – 2022-02-22 (×2): 0.4 mg via ORAL
  Filled 2022-02-21 (×2): qty 1

## 2022-02-21 MED ORDER — METOPROLOL TARTRATE 12.5 MG HALF TABLET
12.5000 mg | ORAL_TABLET | Freq: Two times a day (BID) | ORAL | Status: DC
  Administered 2022-02-21 – 2022-02-23 (×4): 12.5 mg via ORAL
  Filled 2022-02-21 (×4): qty 1

## 2022-02-21 MED ORDER — LACTATED RINGERS IV BOLUS (SEPSIS): 1000.0000 mL | Freq: Once | INTRAVENOUS | Status: DC

## 2022-02-21 MED ORDER — SODIUM CHLORIDE 0.9 % IV SOLN
2.0000 g | Freq: Once | INTRAVENOUS | Status: AC
  Administered 2022-02-21: 2 g via INTRAVENOUS
  Filled 2022-02-21: qty 20

## 2022-02-21 MED ORDER — HEPARIN BOLUS VIA INFUSION
6000.0000 [IU] | Freq: Once | INTRAVENOUS | Status: AC
Start: 2022-02-21 — End: 2022-02-21
  Administered 2022-02-21: 6000 [IU] via INTRAVENOUS
  Filled 2022-02-21: qty 6000

## 2022-02-21 MED ORDER — HYDRALAZINE HCL 20 MG/ML IJ SOLN
10.0000 mg | Freq: Once | INTRAMUSCULAR | Status: AC
  Administered 2022-02-21: 10 mg via INTRAVENOUS
  Filled 2022-02-21: qty 1

## 2022-02-21 MED ORDER — ALBUTEROL SULFATE (2.5 MG/3ML) 0.083% IN NEBU: 2.5000 mg | INHALATION_SOLUTION | RESPIRATORY_TRACT | Status: DC | PRN

## 2022-02-21 MED ORDER — DIAZEPAM 2 MG PO TABS
1.0000 mg | ORAL_TABLET | Freq: Three times a day (TID) | ORAL | Status: DC | PRN
  Administered 2022-02-23: 1 mg via ORAL
  Filled 2022-02-21: qty 1

## 2022-02-21 MED ORDER — DILTIAZEM HCL 25 MG/5ML IV SOLN
5.0000 mg | Freq: Once | INTRAVENOUS | Status: DC
Start: 2022-02-21 — End: 2022-02-21
  Filled 2022-02-21: qty 5

## 2022-02-21 MED ORDER — SODIUM CHLORIDE 0.9 % IV SOLN
2.0000 g | INTRAVENOUS | Status: DC
  Administered 2022-02-22 – 2022-02-23 (×2): 2 g via INTRAVENOUS
  Filled 2022-02-21 (×2): qty 20

## 2022-02-21 MED ORDER — SIMVASTATIN 20 MG PO TABS
40.0000 mg | ORAL_TABLET | Freq: Every day | ORAL | Status: DC
  Administered 2022-02-22: 40 mg via ORAL
  Filled 2022-02-21: qty 2

## 2022-02-21 MED ORDER — IOHEXOL 350 MG/ML SOLN
100.0000 mL | Freq: Once | INTRAVENOUS | Status: AC | PRN
  Administered 2022-02-21: 100 mL via INTRAVENOUS

## 2022-02-21 MED ORDER — VANCOMYCIN HCL 2000 MG/400ML IV SOLN
2000.0000 mg | Freq: Once | INTRAVENOUS | Status: AC
  Administered 2022-02-21: 2000 mg via INTRAVENOUS
  Filled 2022-02-21: qty 400

## 2022-02-21 MED ORDER — OXYCODONE-ACETAMINOPHEN 5-325 MG PO TABS
2.0000 | ORAL_TABLET | ORAL | Status: DC
  Administered 2022-02-21 – 2022-02-22 (×4): 2 via ORAL
  Filled 2022-02-21 (×4): qty 2

## 2022-02-21 MED ORDER — OXYCODONE-ACETAMINOPHEN 5-325 MG PO TABS: 2.0000 | ORAL_TABLET | ORAL | Status: DC | PRN

## 2022-02-21 MED ORDER — ASPIRIN EC 81 MG PO TBEC
81.0000 mg | DELAYED_RELEASE_TABLET | Freq: Every day | ORAL | Status: DC
Start: 2022-02-21 — End: 2022-02-23
  Administered 2022-02-21 – 2022-02-23 (×3): 81 mg via ORAL
  Filled 2022-02-21 (×3): qty 1

## 2022-02-21 MED ORDER — HYDROMORPHONE HCL 1 MG/ML IJ SOLN
1.0000 mg | Freq: Once | INTRAMUSCULAR | Status: AC
Start: 2022-02-21 — End: 2022-02-21
  Administered 2022-02-21: 1 mg via INTRAVENOUS
  Filled 2022-02-21: qty 1

## 2022-02-21 MED ORDER — LACTATED RINGERS IV BOLUS (SEPSIS)
1000.0000 mL | Freq: Once | INTRAVENOUS | Status: AC
  Administered 2022-02-21: 1000 mL via INTRAVENOUS

## 2022-02-21 MED ORDER — NALOXONE HCL 0.4 MG/ML IJ SOLN: 0.4000 mg | INTRAMUSCULAR | Status: DC | PRN

## 2022-02-21 MED ORDER — PANTOPRAZOLE SODIUM 40 MG PO TBEC
40.0000 mg | DELAYED_RELEASE_TABLET | Freq: Every day | ORAL | Status: DC
  Administered 2022-02-21 – 2022-02-23 (×3): 40 mg via ORAL
  Filled 2022-02-21 (×3): qty 1

## 2022-02-21 MED ORDER — FENTANYL CITRATE PF 50 MCG/ML IJ SOSY
50.0000 ug | PREFILLED_SYRINGE | Freq: Once | INTRAMUSCULAR | Status: AC
  Administered 2022-02-21: 50 ug via INTRAVENOUS
  Filled 2022-02-21: qty 1

## 2022-02-21 MED ORDER — LACTATED RINGERS IV SOLN: INTRAVENOUS | Status: AC

## 2022-02-21 MED ORDER — LACTATED RINGERS IV BOLUS (SEPSIS): 500.0000 mL | Freq: Once | INTRAVENOUS | Status: DC

## 2022-02-21 MED ORDER — HYDROCODONE-ACETAMINOPHEN 5-325 MG PO TABS
1.0000 | ORAL_TABLET | Freq: Once | ORAL | Status: AC
  Administered 2022-02-21: 1 via ORAL
  Filled 2022-02-21: qty 1

## 2022-02-21 MED ORDER — SENNOSIDES-DOCUSATE SODIUM 8.6-50 MG PO TABS: 1.0000 | ORAL_TABLET | Freq: Every evening | ORAL | Status: DC | PRN

## 2022-02-21 MED ORDER — SODIUM CHLORIDE 0.9 % IV SOLN: 2.0000 g | Freq: Once | INTRAVENOUS | Status: DC

## 2022-02-21 MED ORDER — VANCOMYCIN HCL IN DEXTROSE 1-5 GM/200ML-% IV SOLN: 1000.0000 mg | Freq: Once | INTRAVENOUS | Status: DC

## 2022-02-21 MED ORDER — VANCOMYCIN VARIABLE DOSE PER UNSTABLE RENAL FUNCTION (PHARMACIST DOSING): Status: DC

## 2022-02-21 MED ORDER — HEPARIN (PORCINE) 25000 UT/250ML-% IV SOLN
1600.0000 [IU]/h | INTRAVENOUS | Status: DC
  Administered 2022-02-21 – 2022-02-23 (×3): 1600 [IU]/h via INTRAVENOUS
  Filled 2022-02-21 (×3): qty 250

## 2022-02-21 MED ORDER — CILOSTAZOL 100 MG PO TABS
100.0000 mg | ORAL_TABLET | Freq: Two times a day (BID) | ORAL | Status: DC
Start: 2022-02-22 — End: 2022-02-23
  Administered 2022-02-22 – 2022-02-23 (×3): 100 mg via ORAL
  Filled 2022-02-21 (×4): qty 1

## 2022-02-21 MED ORDER — HYDROMORPHONE HCL 1 MG/ML IJ SOLN
1.0000 mg | INTRAMUSCULAR | Status: DC | PRN
  Administered 2022-02-21 – 2022-02-23 (×3): 1 mg via INTRAVENOUS
  Filled 2022-02-21 (×3): qty 1

## 2022-02-21 NOTE — Progress Notes (Signed)
Pharmacy Antibiotic Note ? ?Tony Zhang is a 76 y.o. male for which pharmacy has been consulted for vancomycin dosing for cellulitis. ? ?Patient with a history of DVT, COPD, AAA. Patient presenting with foot infection. ? ?SCr 2.5 - above baseline ?WBC 11; T 97.9 F; HR 135; RR 16 ? ?Plan: ?Ceftriaxone per MD ?Vancomycin 2000 mg once, subsequent dosing as indicated per random vancomycin level until renal function stable and/or improved, at which time scheduled dosing can be considered ?Trend WBC, Fever, Renal function, & Clinical course ?F/u cultures, clinical course, WBC, fever ?De-escalate when able ? ?  ? ?Temp (24hrs), Avg:97.9 ?F (36.6 ?C), Min:97.9 ?F (36.6 ?C), Max:97.9 ?F (36.6 ?C) ? ?No results for input(s): WBC, CREATININE, LATICACIDVEN, VANCOTROUGH, VANCOPEAK, VANCORANDOM, GENTTROUGH, GENTPEAK, GENTRANDOM, TOBRATROUGH, TOBRAPEAK, TOBRARND, AMIKACINPEAK, AMIKACINTROU, AMIKACIN in the last 168 hours.  ?CrCl cannot be calculated (Patient's most recent lab result is older than the maximum 21 days allowed.).   ? ?Allergies  ?Allergen Reactions  ? Codeine Hives and Other (See Comments)  ?  Takes benadryl to stop reaction  ? ? ?Antimicrobials this admission: ?Ceftriaxone x 1 in ED 5/7  >>  ?Vancomycin 5/7 >>  ? ?Microbiology results: ?Pending ? ?Thank you for allowing pharmacy to be a part of this patient?s care. ? ?Delmar Landau, PharmD, BCPS ?02/21/2022 2:15 PM ?ED Clinical Pharmacist -  [phone] ?  ?

## 2022-02-21 NOTE — ED Provider Notes (Addendum)
Care assumed from [place] at shift change, please see her note for full details, but in brief Tony Zhang is a 76 y.o. male who presented with concern for foot infection.  Had a foreign object thrown into his left foot by a lawnmower about 2 weeks ago, and also reports stepping on a nail with his right foot the same day.  Evaluated in urgent care and sent to the ED due to concern for infection and tachycardia. ? ?Patient was tachycardic to the 150s on arrival, suspected to be normal sinus rhythm, given concern for foot infection code sepsis was called and patient started on vancomycin and Rocephin. ? ?BP (!) 209/89   Pulse (!) 131   Temp 97.9 ?F (36.6 ?C) (Oral)   Resp (!) 22   SpO2 100%  ? ? ? ?Procedures  ?Procedures ? ?ED Course / MDM  ?  ?Medical Decision Making ?Amount and/or Complexity of Data Reviewed ?Labs: ordered. ?Radiology: ordered. ? ?Risk ?Prescription drug management. ?Decision regarding hospitalization. ? ? ? ?I have personally reviewed and interpreted patient's imaging, x-ray with foreign body present in the left foot with associated redness and swelling on exam.  Patient also with some generalized abdominal pain, CT abdomen pelvis pending.  Lab work thus far has been significant for a mildly elevated white blood cell count, slight worsening in renal function compared to labs 1 year ago with signs of dehydration.  Initial troponin elevated at 56, suspect this is primarily rate mediated, will continue to trend.  EKG without acute ischemic changes. ? ?CT abdomen pelvis with suprarenal AAA at 3.9 cm, but no other acute findings noted on abdomen.  Given patient's GFR of 26 he is not currently a candidate for contrasted vascular study to look more closely at aneurysm, low suspicion for rupture at this time. ? ?After 2 L of fluid patient has had some improvement in his tachycardia but remains tachycardic to the 120s and has become increasingly hypertensive, 10 mg of hydralazine given per  Dr. Vevelyn Francois recommendation. ? ?Repeat chest x-ray with slight increasing and bibasilar opacities favored to be atelectasis versus infection or edema. ? ?Patient will require hospital admission for continued IV antibiotics and further investigation of tachycardia, still seems most consistent with sinus tachycardia but did consider a flutter. ? ?I requested consultation with medicine service for admission.  Case discussed with internal medicine teaching service who will see and admit the patient. ? ?Discussed case with admitting team at bedside, repeat metabolic panel shows significant improvement in patient's renal function after fluid resuscitation with GFR of 42 and given his subtherapeutic INR concern for potential PE.  Will get CT PE study and they will see and admit patient. ? ?  ?Jacqlyn Larsen, PA-C ?02/21/22 1737 ? ?  ?Ezequiel Essex, MD ?02/22/22 0100 ? ?

## 2022-02-21 NOTE — Progress Notes (Signed)
ANTICOAGULATION CONSULT NOTE - Initial Consult ? ?Pharmacy Consult for Heparin ?Indication:  Recurrent VTE, coumadin chronically may need procedure while inpt ? ?Allergies  ?Allergen Reactions  ? Codeine Hives and Other (See Comments)  ?  Takes benadryl to stop allergic reactions  ? ? ?Patient Measurements: ?  ?Heparin Dosing Weight: 93.4 kg ? ?Vital Signs: ?Temp: 97.9 ?F (36.6 ?C) (05/07 1347) ?Temp Source: Oral (05/07 1347) ?BP: 167/86 (05/07 1730) ?Pulse Rate: 147 (05/07 1730) ? ?Labs: ?Recent Labs  ?  02/21/22 ?1358 02/21/22 ?1405 02/21/22 ?1608  ?HGB 15.3  --   --   ?HCT 46.2  --   --   ?PLT 374  --   --   ?APTT  --  34  --   ?LABPROT 17.6*  --   --   ?INR 1.5*  --   --   ?CREATININE 2.50*  --  1.69*  ?TROPONINIHS  --  56* 48*  ? ? ?CrCl cannot be calculated (Unknown ideal weight.). ? ? ?Medical History: ?Past Medical History:  ?Diagnosis Date  ? AAA (abdominal aortic aneurysm) (HCC)   ? Blood dyscrasia   ? followed by Dr. Truett Perna, for clotting issue, has been on Coumadin for about 20 yrs.    ? COPD (chronic obstructive pulmonary disease) (HCC)   ? DJD (degenerative joint disease)   ? Dyslipidemia   ? Gout   ? Malignant hypertension   ? Peripheral vascular disease (HCC)   ? Pneumonia 08/2013  ? pt. reports that he is having this surgery 11/25/2014- for the lung problem that began with pneumonia in 09/2014  ? Shortness of breath   ? 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 ? ?Medications:  ?(Not in a hospital admission) ? ?Scheduled:  ? [START ON 02/22/2022] cilostazol  100 mg Oral BID  ? pantoprazole  40 mg Oral Daily  ? [START ON 02/22/2022] simvastatin  40 mg Oral q1800  ? tamsulosin  0.4 mg Oral QPM  ? vancomycin variable dose per unstable renal function (pharmacist dosing)   Does not apply See admin instructions  ? ?Infusions:  ? lactated ringers 150 mL/hr at 02/21/22 1443  ? ?PRN: albuterol, diazepam, HYDROmorphone (DILAUDID) injection, naLOXone (NARCAN)  injection, oxyCODONE-acetaminophen,  senna-docusate ? 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 Patient is presenting with foot infection. Heparin per pharmacy consult placed for  Recurrent VTE, coumadin chronically may need procedure while inpt . ? ?Patient on warfarin pta. Per patient taking 2 mg on Mondays. Take 4 mg the rest of the week ? ?Hgb 15.3; plt 374 ?aPTT 34; PT 17.6; INR 1.5 ? ?Goal of Therapy:  ?Heparin level 0.3-0.7 units/ml ?Monitor platelets by anticoagulation protocol: Yes ?  ?Plan:  ?Give 6000 units bolus x 1 ?Start heparin infusion at 1600 units/hr ?Check anti-Xa level in 8 hours and daily while on heparin ?Continue to monitor H&H and platelets ? ?Delmar Landau, PharmD, BCPS ?02/21/2022 6:26 PM ?ED Clinical Pharmacist -  [phone] ? ?  ?

## 2022-02-21 NOTE — ED Provider Notes (Addendum)
[facility]   CSN: [id number] Arrival date & time: 02/21/22  1334     History  Chief Complaint  Patient presents with   Tachycardia    Foot infection    Tony MinorsJerry F Grimley is a 76 y.o. male with a past medical history of DVT, COPD, AAA presenting today with a complaint of a foot infection.  He says that 2 weeks ago he had a foreign object thrown through his left foot by the lawnmower.  On the same day he stepped on a nail on his right foot.  [facility] was evaluated [facility] and they sent him to the emergency department.  He says that they did x-rays of his left foot but he did not tell them about the nail on the right foot.  His tetanus was updated then.  Over the past week he has been endorsing chills, vomiting and pain to his bilateral lower extremities.     Home Medications Prior to Admission medications   Medication Sig Start Date End Date Taking? Authorizing Provider  acetaminophen (TYLENOL) 650 MG CR tablet Take 650 mg by mouth every 8 (eight) hours as needed for pain.    [provider]  albuterol (VENTOLIN HFA) 108 (90 Base) MCG/ACT inhaler Inhale 2 puffs into the lungs every 6 (six) hours as needed for wheezing or shortness of breath.    [provider]  allopurinol (ZYLOPRIM) 300 MG tablet Take 300 mg by mouth daily.    [provider]  cefpodoxime (VANTIN) 100 MG tablet Take 1 tablet (100 mg total) by mouth 2 (two) times daily. 09/13/20   Margarita Grizzleay, Danielle, MD  cilostazol (PLETAL) 100 MG tablet Take 1 tablet (100 mg total) by mouth 2 (two) times daily. Please schedule appointment for refills. 03/07/17   Croitoru, Mihai, MD  cyclobenzaprine (FLEXERIL) 10 MG tablet Take 10 mg by mouth 3 (three) times daily as needed for muscle spasms.    [provider]  diazepam (VALIUM) 2 MG tablet Take 2 mg by mouth every evening.    [provider]  famotidine (PEPCID) 20 MG tablet Take 1  tablet (20 mg total) by mouth daily. 08/14/19   Marguerita MerlesSheikh, Omair Latif, DO  Fenofibrate 120 MG TABS Take 120 mg by mouth daily.     [provider]  guaiFENesin (MUCINEX) 600 MG 12 hr tablet Take 1 tablet (600 mg total) by mouth 2 (two) times daily. Patient not taking: Reported on 09/13/2020 08/14/19   Marguerita MerlesSheikh, Omair Latif, DO  hydrochlorothiazide (HYDRODIURIL) 25 MG tablet hydrochlorothiazide 25 mg tablet    [provider]  HYDROcodone-acetaminophen (NORCO) 10-325 MG tablet Take 1 tablet by mouth every 4 (four) hours.    [provider]  ipratropium-albuterol (DUONEB) 0.5-2.5 (3) MG/3ML SOLN Take 3 mLs by nebulization every 6 (six) hours as needed. FOR COPD J44.9 and Pneumonia J18.9 Patient not taking: Reported on 09/13/2020 08/14/19   Marguerita MerlesSheikh, Omair Latif, DO  iron polysaccharides (NIFEREX) 150 MG capsule Take 1 capsule (150 mg total) by mouth daily. Patient not taking: Reported on 09/13/2020 08/15/19   Marguerita MerlesSheikh, Omair Latif, DO  lisinopril (ZESTRIL) 10 MG tablet Take 10 mg by mouth daily. Patient not taking: Reported on 09/13/2020    [provider]  metoprolol succinate (TOPROL-XL) 100 MG 24 hr tablet Take 100 mg by mouth daily.     [provider]  omeprazole (PRILOSEC) 40 MG capsule Take 40 mg by mouth daily.  [provider]  ondansetron (ZOFRAN) 4 MG tablet Take 1 tablet (4 mg total) by mouth every 6 (six) hours as needed for nausea. Patient not taking: Reported on 09/13/2020 08/14/19   Marguerita Merles Latif, DO  ondansetron (ZOFRAN) 8 MG tablet Take by mouth. 08/18/20   [provider]  oxyCODONE-acetaminophen (PERCOCET) 10-325 MG tablet Take 1 tablet by mouth See admin instructions. Every three hours. 09/04/20   [provider]  phenazopyridine (PYRIDIUM) 100 MG tablet Take 1 tablet (100 mg total) by mouth 3 (three) times daily with meals. Patient not taking: Reported on 09/13/2020 08/14/19   Marguerita Merles Latif, DO   senna-docusate (SENOKOT-S) 8.6-50 MG tablet Take 1 tablet by mouth at bedtime as needed for mild constipation. 08/14/19   Marguerita Merles Latif, DO  simvastatin (ZOCOR) 40 MG tablet Take 1 tablet (40 mg total) by mouth daily at 6 PM. Need appointment for refills 05/10/17   Croitoru, Mihai, MD  tamsulosin (FLOMAX) 0.4 MG CAPS capsule Take 0.4 mg by mouth every evening.    [provider]  warfarin (COUMADIN) 2 MG tablet Take 1 tablet (2 mg total) by mouth daily. Patient taking differently: Take 4 mg by mouth daily in the afternoon. Take 2 mg on Mondays. Take 4 mg the rest of the week 08/14/15   Ladene Artist, MD      Allergies    Codeine    Review of Systems   Review of Systems  Physical Exam Updated Vital Signs BP (!) 141/95 (BP Location: Left Arm)   Pulse (!) 152   Temp 97.9 F (36.6 C) (Oral)   Resp (!) 24   SpO2 98%  Physical Exam Vitals and nursing note reviewed.  Constitutional:      General: He is not in acute distress.    Appearance: Normal appearance. He is diaphoretic.  HENT:     Head: Normocephalic and atraumatic.  Eyes:     General: No scleral icterus.    Conjunctiva/sclera: Conjunctivae normal.  Cardiovascular:     Rate and Rhythm: Tachycardia present.     Comments: Weak DP pulses laterally.  Cap refill <3 Pulmonary:     Effort: Pulmonary effort is normal. No respiratory distress.  Abdominal:     General: Abdomen is flat.     Palpations: Abdomen is soft.     Tenderness: There is abdominal tenderness.  Skin:    General: Skin is warm.     Capillary Refill: Capillary refill takes 2 to 3 seconds.     Findings: No rash.     Comments: Potential entrance and exit wound to the left foot with some inflamed and flaky skin.  Neurological:     Mental Status: He is alert.     Motor: No weakness.     Comments: Distal sensation intact  Psychiatric:        Mood and Affect: Mood normal.     Media Information Document Information    ED Results /  Procedures / Treatments   Labs (all labs ordered are listed, but only abnormal results are displayed) Labs Reviewed  PROTIME-INR - Abnormal; Notable for the following components:      Result Value   Prothrombin Time 17.6 (*)    INR 1.5 (*)    All other components within normal limits  COMPREHENSIVE METABOLIC PANEL - Abnormal; Notable for the following components:   Sodium 133 (*)    Potassium 5.4 (*)    CO2 14 (*)    Glucose, Bld  128 (*)    BUN 33 (*)    Creatinine, Ser 2.50 (*)    GFR, Estimated 26 (*)    All other components within normal limits  CBC WITH DIFFERENTIAL/PLATELET - Abnormal; Notable for the following components:   WBC 11.0 (*)    RDW 16.7 (*)    Neutro Abs 8.0 (*)    Basophils Absolute 0.2 (*)    Abs Immature Granulocytes 0.25 (*)    All other components within normal limits  TROPONIN I (HIGH SENSITIVITY) - Abnormal; Notable for the following components:   Troponin I (High Sensitivity) 56 (*)    All other components within normal limits  RESP PANEL BY RT-PCR (FLU A&B, COVID) ARPGX2  CULTURE, BLOOD (ROUTINE X 2)  CULTURE, BLOOD (ROUTINE X 2)  LACTIC ACID, PLASMA  APTT  URINALYSIS, ROUTINE W REFLEX MICROSCOPIC  LACTIC ACID, PLASMA    EKG EKG Interpretation  Date/Time:  Sunday Feb 21 2022 13:50:07 EDT Ventricular Rate:  151 PR Interval:    QRS Duration: 84 QT Interval:  254 QTC Calculation: 402 R Axis:   -80 Text Interpretation: Supraventricular tachycardia Left axis deviation Left ventricular hypertrophy with repolarization abnormality ( R in aVL ) Inferior infarct , age undetermined Anteroseptal infarct , age undetermined Abnormal ECG When compared with ECG of 13-Sep-2020 06:45, PREVIOUS ECG IS PRESENT Confirmed by Kristine Royal [phone]) on 02/21/2022 2:13:09 PM  Radiology DG Chest Portable 1 View  Result Date: 02/21/2022 CLINICAL DATA:  Sepsis.  Left foot infection for 2 weeks. EXAM: PORTABLE CHEST 1 VIEW COMPARISON:  None Available. FINDINGS: The  cardiomediastinal silhouette is unremarkable. No pneumothorax. Mild bibasilar subsegmental atelectasis. Tiny effusions not excluded. No other acute abnormalities. IMPRESSION: Suspected small bilateral effusions and underlying atelectasis. No other abnormalities. Electronically Signed   By: Gerome Sam III M.D.   On: 02/21/2022 15:15   DG Foot Complete Left  Result Date: 02/21/2022 CLINICAL DATA:  Foreign Body/infection. EXAM: LEFT FOOT - COMPLETE 3+ VIEW COMPARISON:  None Available. FINDINGS: No fracture or dislocation. Vascular calcifications. Numerous foreign bodies in the mid to hindfoot region seen on both views. There is a dominant foreign body measuring 7 mm along the bottom of the foot. There are numerous tiny punctate foreign bodies extending from the medial foot into the central foot region spanning a total of 6.3 cm. IMPRESSION: Foreign bodies in the foot as above. The dominant foreign body measures 7 mm. I suspect the patient had a puncture wound and there are numerous tiny foreign bodies along the suspected tract as above. Electronically Signed   By: Gerome Sam III M.D.   On: 02/21/2022 15:19   DG Foot Complete Right  Result Date: 02/21/2022 CLINICAL DATA:  Infection. EXAM: RIGHT FOOT COMPLETE - 3+ VIEW COMPARISON:  None Available. FINDINGS: Vascular calcifications. No fractures. There is a small foreign body measuring 1 or 2 mm along the medial bottom of the foot at the level of the distal third of the first metatarsal. No other acute abnormalities. IMPRESSION: Small 1 or 2 mm foreign body along the medial bottom of the foot. Electronically Signed   By: Gerome Sam III M.D.   On: 02/21/2022 15:21    Procedures .Critical Care Performed by: [name], PA-C Authorized by: [name], PA-C   Critical care provider statement:    Critical care time (minutes):  60   Critical care time was exclusive of:  Separately billable procedures and treating other patients    Critical care was  necessary to treat or prevent imminent or life-threatening deterioration of the following conditions:  Sepsis   Critical care was time spent personally by me on the following activities:  Blood draw for specimens, development of treatment plan with patient or surrogate, discussions with primary provider, examination of patient, evaluation of patient's response to treatment, obtaining history from patient or surrogate, ordering and performing treatments and interventions, ordering and review of laboratory studies, ordering and review of radiographic studies, re-evaluation of patient's condition and review of old charts   I assumed direction of critical care for this patient from another provider in my specialty: no     Medications Ordered in ED Medications  lactated ringers infusion (has no administration in time range)  vancomycin (VANCOCIN) IVPB 1000 mg/200 mL premix (has no administration in time range)  cefTRIAXone (ROCEPHIN) 2 g in sodium chloride 0.9 % 100 mL IVPB (2 g Intravenous New Bag/Given 02/21/22 1414)  lactated ringers bolus 1,000 mL (has no administration in time range)    And  lactated ringers bolus 1,000 mL (has no administration in time range)    And  lactated ringers bolus 1,000 mL (has no administration in time range)    And  lactated ringers bolus 500 mL (has no administration in time range)    ED Course/ Medical Decision Making/ A&P                           Medical Decision Making Amount and/or Complexity of Data Reviewed Labs: ordered. Radiology: ordered.  Risk Prescription drug management. Decision regarding hospitalization.   This patient presents to the ED for concern of pain in his feet.  On arrival he was very tachycardic.  Differential included A-fib RVR, SVT, sepsis, dehydration.   This is not an exhaustive differential.    Past Medical History / Co-morbidities / Social History: PAD   Physical Exam: Physical exam performed. The  pertinent findings include: Potential entrance and exit wound to the left foot with some inflamed and flaky skin.  Lab Tests: I ordered, and personally interpreted labs.  The pertinent results include:  -Sodium 133 Creatinine 2.5 Potassium 5.4 INR 1.5 Troponin 56, likely in setting of tachycardia   Imaging Studies: I ordered imaging studies including x-rays of bilateral feet, chest and CT abdomen pelvis. I independently visualized and interpreted imaging which showed foreign bodies in bilateral feet. I agree with the radiologist interpretation.   Cardiac Monitoring:  The patient was maintained on a cardiac monitor.  My attending physician Messick viewed and interpreted the cardiac monitored which showed an underlying rhythm of: Sinus tachycardia, visible after IVF   Medications: I ordered medication including hydrocodone for patient's pain, Rocephin and Vanco for sepsis coverage. Reevaluation of the patient after these medicines showed that the patient improved. I have reviewed the patients home medicines and have made adjustments as needed.  Disposition:  I discussed this case with my attending physician Dr. Rodena Medin who agreed that the patient requires admission to the hospital for sepsis, likely in the setting of foot wound.  Attending physician stated agreement with plan or made changes to plan which were implemented.      Patient signed out to oncoming PA, Jodi Geralds, at this time.  She will take admission call  Final Clinical Impression(s) / ED Diagnoses Final diagnoses:  Sepsis, due to unspecified organism, unspecified whether acute organ dysfunction present [facility])     Alan Riles A, PA-C 02/21/22 1537  Wynetta Fines, MD 02/23/22 1308    [name], PA-C 03/24/22 0950    Wynetta Fines, MD 03/26/22 [phone]

## 2022-02-21 NOTE — Progress Notes (Signed)
Notified by RN for persistent tachycardia with heart rate in the 140s.  When evaluated bedside, patient appears in no acute distress.  He reports doing well.  States the pain is better controlled after his last dose of Percocet at 8 PM.  ? ?Patient is on high dose of Percocet at home, 10-325 mg Q4H.  He has been on this dose since January.  Patient states that he ran out of his pain medication in the last few days and has been taking Valium to reduce symptoms of withdrawal.  Currently denies tremors, GI symptoms or restlessness. ? ?Measure heart rate by stethoscope was 120.  Tachycardic.  Regular rhythm.  2/6 systolic murmur in the upper sternal border. ?Normal work of breathing.  No signs of respiratory distress. ?Normal pupil, reactive to light. ? ?The etiology of his tachycardia is likely secondary to pain, infection and opioid withdrawal.  PE was ruled out.  Troponin initially elevated but trending down likely secondary to his tachycardia.  No concern for ACS. ? ?His COWS score of 4-5, which suggest mild withdrawal.  His only symptom now is tachycardia.  May be his nausea/vomiting 2 days ago were also symptoms of opioid withdrawal. ? ?-Obtain EKG now.  If remains in sinus tachycardia, will give a dose of IV Dilaudid for pain. ?-Continue home dose Percocet ?-Valium 1 mg Q8H PRN ?-Can consider recheck echocardiogram in a.m. if tachycardia does not improve ?

## 2022-02-21 NOTE — Sepsis Progress Note (Signed)
Elink following code sepsis °

## 2022-02-21 NOTE — ED Triage Notes (Signed)
Pt reports L foot infection x 2 weeks.  States he got something in his foot while mowing 2 weeks ago.  Seen at an [facility] ED and had it irrigated.  Also reports fast HR for 2 weeks and vomiting x 1 week.  Pt has SOB- that he states is chronic. ?

## 2022-02-21 NOTE — H&P (Addendum)
? ? ? ?Date: 02/21/2022     ?     ?     ?Patient Name:  Tony Zhang MRN: [id number]  ?DOB: 01-05-1946 Age / Sex: 76 y.o., male   ?PCP: [name], MD    ?     ?Medical Service: Internal Medicine Teaching Service    ?     ?Attending Physician: Dr. Carlynn Purl    ?First Contact: Dr. Carlyn Reichert Pager: [phone]  ?Second Contact: Dr. Marolyn Haller Pager: [phone]  ?     ?After Hours (After 5p/  First Contact Pager: [phone]  ?weekends / holidays): Second Contact Pager: [phone]  ? ?Chief Complaint: foot injury ? 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  ? ?The patient reports that two weeks ago he was mowing his lawn in his flip flops when a foreign object was thrown from the lawn mower and hit his L foot. He reports that soon afterwards he stepped on a nail with his other foot, which partially pierced his skin. He reports going to an urgent care where they took an X-ray of his foot, prescribed him antibiotics (which he cannot name), and sent him home. He reportedly got a tetanus vaccine there.  ? ?He reports that two days ago he began having nausea after eating a bad egg biscuit. He vomited twice and went to the urgent care again. He reports they were concerned about his feet and his high heart rate and told him to come to the ED. He reports that his pain is currently a 9/10, but is mostly coming from his back.  ? ?ROS notable for stable/chronic chest pain, shortness of breath and occasional migraines.  ? ?Meds:  ?Tylenol 650 mg q8hrs PRN ?Norco 10-325 q4hrs ?Valium 2 mg TID PRN ?Coumadin 2 mg daily ?Lisinopril 10 mg daily ?Toprol 100 mg daily (not taking) ?Flomax 0.4 mg daily ?Simvastatin 40 mg daily ?Prilosec 40 mg daily ?Fenofibrate 120 mg daily ?Senokot-S 1 tablet daily ?Cilostazol 100 mg BID ?Cefpodoxime 100 mg  BID (completed) ? ? ?Allergies: ?Allergies as of 02/21/2022 - Review Complete 02/21/2022  ?Allergen Reaction Noted  ? Codeine Hives and Other (See Comments) 09/29/2011  ? ?Past Medical History:  ?Diagnosis Date  ? AAA (abdominal aortic aneurysm) (HCC)   ? Blood dyscrasia   ? followed by Dr. Truett Perna, for clotting issue, has been on Coumadin for about 20 yrs.    ? COPD (chronic obstructive pulmonary disease) (HCC)   ? DJD (degenerative joint disease)   ? Dyslipidemia   ? Gout   ? Malignant hypertension   ? Peripheral vascular disease (HCC)   ? Pneumonia 08/2013  ? pt. reports that he is having this surgery 11/25/2014- for the lung problem that began with pneumonia in 09/2014  ? Shortness of breath   ? 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 ? ?Family History:  ?Father and mother both died of AMI ?Brother died of "jaundice in [place]" ?Sister died of cancer ? ?Social History:  ?Worked as a Psychologist, occupational for years ?Smoked 1.5 ppd for 30 years ?No EtOH ?Denies illegal drug use ? ?Review of Systems: ?A complete ROS was negative except as per HPI.  ? ?Physical Exam: ?Blood pressure (!) 167/86, pulse (!) 147, temperature 97.9 ?F (36.6 ?C), temperature source Oral, resp. rate (!) 22, SpO2 99 %. ?General:  He is in obvious discomfort. He is tachycardic from 140-150, satting well on RA.  ?HENT: Normocephalic and atraumatic, EOMI, conjunctiva normal, moist mucous membranes ?Cardiovascular: tachycardic, regular rhythm, S1 and S2 present, no murmurs, rubs, gallops.  Distal pulses intact. ?Respiratory: Effort is normal on room air. CTAB. Satting well on RA.  ?Abdominal: mildly TTP, +BS ?Musculoskeletal: Normal bulk and tone.  No peripheral edema noted. ?Skin/extremities: L foot warmer than right, small puncture wounds on the lateral and medial aspect of the foot with surrounding erythema and induration. ? ? ? ? ?Neurological: Alert and oriented x4, no apparent focal deficits noted. ?Psychiatric: Normal mood and affect. Behavior is normal.   ? ? ?EKG: personally reviewed my interpretation is sinus trachycardia ? ?CXR: personally reviewed my interpretation is no active cardiopulmonary disease ? 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  ? ?#Sepsis due to cellulitis from L and R foot multiple foreign bodies ?Patient presents with multiple wounds on the feet, found to have multiple small foreign bodies on XR of both feet. Patient reports mechanism was a mowing accident. Concern for some cellulitis given erythema and induration.  ?-Admit to IMTS, Dr. Mikey Bussing attending ?-Orthopedics consult ?-Continue broad spectrum antibiotics (Vanc and Rocephin) ?-f/u Bcx, NG at 12 hrs ?-S/p 2L LR ? ?#Sinus tachycardia with a history of venous thromboembolic disease (on warfarin) ?#AKI ?INR of 1.5 in the context of missing several days of medication with significant tachycardia and stable SoB/CP. No new oxygen requirement. Concern for PE. CTPE obtained showing no PE. Etiology of tachycardia likely pain. Cr 2.5 -> 1.7 with fluids.  ?-Heparin gtt in place of warfarin given possible need for surgery ?-Echo ?-S/p 2L LR, with 150 mL/hr for 20 hrs ?-Avoid lisinopril given AKI ? ?#Chronic pain ?-Continue Percocet 10-650 q4hrs (consistent fills in PDMP), for 5 doses ?-Dilaudid 1 mg q3 hrs PRN ?-Continue home Diazepam at reduced dose of 1 mg q8hrs (consistent fills in PDMP) ? 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  ?-Semi-annual follow up and referral to CT surgery ? ?#Moderate aortic stenosis ?Echo from 2020 showing moderate aortic stenosis, normal EF.  ?-Encourage cardiology follow up ? ?#HTN ?Patient does not appear to be taking Toprol at home. Avoiding  lisinopril d/t AKI. Last BP 144/75. ? ?#GERD ?-Continue home Protonix ? ?#HLD ?-Continue home Simvastatin ? ?#BPH ?-Continue home Flomax ? ?#PAD ?-Continue home Pletal ? ?FEN: IVF, NPO pending surgery ?VTE: heparin gtt in place of warfarin ?Code status: FULL ?Dispo: TBD, likely home ? ?Dispo: Admit patient to Inpatient with expected length of stay greater than 2 midnights. ? ?Signed: ?Carlyn Reichert, MD ?PGY-1 ?Pager: [phone] ?Please contact the on call pager after 5 pm and on weekends at [phone]. ? ?

## 2022-02-21 NOTE — Hospital Course (Addendum)
Mowing with his flip flops and something hit his left foot. Then stepped on a nail on R foot. He was in pain and walked back into a nail.  ? ? ?He has been cleaning his wounds, particularly the R foot. He also has chronic pain OA in his back.  ? ?This all happened two weeks ago. Felt the hit backed up and felt something go through his R foot.  ? ? ?Felt like he was going to throw up. Was in significant pain but none that he could cope with.  ? ?Throw up again yesterday. Nauseous this AM but did not have to throw up.  ? ?Also wanted to get his foot evaluated.  ? ?Went to urgent care this AM told his heart rate was too elevated and told he should present to the hospital.  ? ? ?Patient takes 6 oxycodone a day to keep the pain at an okay level.  ? ?Had some subjective fevers and chills. Once in awhile migraine or headache because does not wear glasses.  ?Has chest pain and shortness of breath.  ? ?Chest pain: chronic since blood clot.  ?  ?Abd pain on R side since his blood clot.  ? ?Medications: ? ?DVT: Takes coumadin every day at 4. Dr. Tamsen Roers every 2 months. On four mg coumadin. [facility]  ?Didn't take coumadin yeseterday because he threw up.  ? ?Would bleed too easily on 4mg  dose.  ?Out of his medicines.  ? ?Took  ? ?[facility] in [place]  ? ?PMH:  ?HTN:  ?Cilostazol PAD  ?HLD fenofibrate  ?Bystolic 10mg   ?Losartan HCTz 100/25 ?Simvastatin 40mg   ?Allopurinol  ?Hydrocodone been off of this for awhile.  ? ? ?PSH: ? ? ? ?FH: ?Dad MI 47s deceased d/t MI  ?Two sisters died of cancer  ?One brother died of cancer  ?Mom died of MI  ? ?SH: ?Don't drink on coumadin  ? ? ? ? ? ?

## 2022-02-21 NOTE — ED Notes (Signed)
Pt endorses cp at this time. Repeat ekg obtained, dr Wyvonnia Dusky at bedside ? ?

## 2022-02-22 ENCOUNTER — Inpatient Hospital Stay (HOSPITAL_COMMUNITY): Payer: Medicare Other

## 2022-02-22 DIAGNOSIS — R0609 Other forms of dyspnea: Secondary | ICD-10-CM

## 2022-02-22 LAB — CBC
HCT: 40.8 % (ref 39.0–52.0)
Hemoglobin: 13.7 g/dL (ref 13.0–17.0)
MCH: 32.1 pg (ref 26.0–34.0)
MCHC: 33.6 g/dL (ref 30.0–36.0)
MCV: 95.6 fL (ref 80.0–100.0)
Platelets: 285 10*3/uL (ref 150–400)
RBC: 4.27 MIL/uL (ref 4.22–5.81)
RDW: 16.9 % — ABNORMAL HIGH (ref 11.5–15.5)
WBC: 8.3 10*3/uL (ref 4.0–10.5)
nRBC: 0.2 % (ref 0.0–0.2)

## 2022-02-22 LAB — ECHOCARDIOGRAM COMPLETE
AR max vel: 1.09 cm2
AV Area VTI: 1.04 cm2
AV Area mean vel: 1.09 cm2
AV Mean grad: 16 mmHg
AV Peak grad: 28.7 mmHg
Ao pk vel: 2.68 m/s
Area-P 1/2: 2.97 cm2
Height: 73 in
S' Lateral: 3.2 cm
Weight: 3296 oz

## 2022-02-22 LAB — BASIC METABOLIC PANEL
Anion gap: 8 (ref 5–15)
Anion gap: 4 — ABNORMAL LOW (ref 5–15)
Anion gap: 7 (ref 5–15)
Anion gap: 9 (ref 5–15)
BUN: 26 mg/dL — ABNORMAL HIGH (ref 8–23)
BUN: 27 mg/dL — ABNORMAL HIGH (ref 8–23)
BUN: 26 mg/dL — ABNORMAL HIGH (ref 8–23)
BUN: 26 mg/dL — ABNORMAL HIGH (ref 8–23)
CO2: 13 mmol/L — ABNORMAL LOW (ref 22–32)
CO2: 18 mmol/L — ABNORMAL LOW (ref 22–32)
CO2: 19 mmol/L — ABNORMAL LOW (ref 22–32)
CO2: 18 mmol/L — ABNORMAL LOW (ref 22–32)
Calcium: 7.4 mg/dL — ABNORMAL LOW (ref 8.9–10.3)
Calcium: 9 mg/dL (ref 8.9–10.3)
Calcium: 8.7 mg/dL — ABNORMAL LOW (ref 8.9–10.3)
Calcium: 8.6 mg/dL — ABNORMAL LOW (ref 8.9–10.3)
Chloride: 116 mmol/L — ABNORMAL HIGH (ref 98–111)
Chloride: 112 mmol/L — ABNORMAL HIGH (ref 98–111)
Chloride: 110 mmol/L (ref 98–111)
Chloride: 108 mmol/L (ref 98–111)
Creatinine, Ser: 1.69 mg/dL — ABNORMAL HIGH (ref 0.61–1.24)
Creatinine, Ser: 1.71 mg/dL — ABNORMAL HIGH (ref 0.61–1.24)
Creatinine, Ser: 1.57 mg/dL — ABNORMAL HIGH (ref 0.61–1.24)
Creatinine, Ser: 1.46 mg/dL — ABNORMAL HIGH (ref 0.61–1.24)
GFR, Estimated: 42 mL/min — ABNORMAL LOW (ref >60)
GFR, Estimated: 41 mL/min — ABNORMAL LOW (ref >60)
GFR, Estimated: 45 mL/min — ABNORMAL LOW (ref >60)
GFR, Estimated: 50 mL/min — ABNORMAL LOW (ref >60)
Glucose, Bld: 88 mg/dL (ref 70–99)
Glucose, Bld: 99 mg/dL (ref 70–99)
Glucose, Bld: 86 mg/dL (ref 70–99)
Glucose, Bld: 83 mg/dL (ref 70–99)
Potassium: 4.1 mmol/L (ref 3.5–5.1)
Potassium: 5.2 mmol/L — ABNORMAL HIGH (ref 3.5–5.1)
Potassium: 5.3 mmol/L — ABNORMAL HIGH (ref 3.5–5.1)
Potassium: 5.3 mmol/L — ABNORMAL HIGH (ref 3.5–5.1)
Sodium: 137 mmol/L (ref 135–145)
Sodium: 134 mmol/L — ABNORMAL LOW (ref 135–145)
Sodium: 136 mmol/L (ref 135–145)
Sodium: 135 mmol/L (ref 135–145)

## 2022-02-22 LAB — HEPARIN LEVEL (UNFRACTIONATED)
Heparin Unfractionated: 0.51 IU/mL (ref 0.30–0.70)
Heparin Unfractionated: 0.44 IU/mL (ref 0.30–0.70)

## 2022-02-22 LAB — PROTIME-INR
INR: 1.7 — ABNORMAL HIGH (ref 0.8–1.2)
Prothrombin Time: 19.4 seconds — ABNORMAL HIGH (ref 11.4–15.2)

## 2022-02-22 MED ORDER — VANCOMYCIN HCL 1250 MG/250ML IV SOLN
1250.0000 mg | INTRAVENOUS | Status: DC
  Administered 2022-02-23: 1250 mg via INTRAVENOUS
  Filled 2022-02-22: qty 250

## 2022-02-22 MED ORDER — ONDANSETRON HCL 4 MG/2ML IJ SOLN
4.0000 mg | Freq: Four times a day (QID) | INTRAMUSCULAR | Status: DC | PRN
  Administered 2022-02-23: 4 mg via INTRAVENOUS
  Filled 2022-02-22: qty 2

## 2022-02-22 MED ORDER — WARFARIN SODIUM 6 MG PO TABS
6.0000 mg | ORAL_TABLET | Freq: Once | ORAL | Status: AC
  Administered 2022-02-22: 6 mg via ORAL
  Filled 2022-02-22: qty 1

## 2022-02-22 MED ORDER — CALCIUM CARBONATE ANTACID 500 MG PO CHEW
1.0000 | CHEWABLE_TABLET | Freq: Three times a day (TID) | ORAL | Status: DC | PRN
  Administered 2022-02-22: 200 mg via ORAL
  Filled 2022-02-22: qty 1

## 2022-02-22 MED ORDER — ACETAMINOPHEN 500 MG PO TABS
1000.0000 mg | ORAL_TABLET | Freq: Three times a day (TID) | ORAL | Status: DC
  Administered 2022-02-22 – 2022-02-23 (×4): 1000 mg via ORAL
  Filled 2022-02-22 (×4): qty 2

## 2022-02-22 MED ORDER — SODIUM CHLORIDE 0.9 % IV BOLUS
1000.0000 mL | Freq: Once | INTRAVENOUS | Status: AC
  Administered 2022-02-22: 1000 mL via INTRAVENOUS

## 2022-02-22 MED ORDER — WARFARIN - PHARMACIST DOSING INPATIENT: Freq: Every day | Status: DC

## 2022-02-22 MED ORDER — OXYCODONE-ACETAMINOPHEN 5-325 MG PO TABS: 2.0000 | ORAL_TABLET | ORAL | Status: DC

## 2022-02-22 MED ORDER — METRONIDAZOLE 500 MG PO TABS
500.0000 mg | ORAL_TABLET | Freq: Two times a day (BID) | ORAL | Status: DC
  Administered 2022-02-22 – 2022-02-23 (×3): 500 mg via ORAL
  Filled 2022-02-22 (×3): qty 1

## 2022-02-22 MED ORDER — PERFLUTREN LIPID MICROSPHERE
1.0000 mL | INTRAVENOUS | Status: AC | PRN
  Administered 2022-02-22: 4 mL via INTRAVENOUS
  Filled 2022-02-22: qty 10

## 2022-02-22 MED ORDER — OXYCODONE HCL 5 MG PO TABS
10.0000 mg | ORAL_TABLET | ORAL | Status: DC | PRN
Start: 2022-02-22 — End: 2022-02-23
  Administered 2022-02-22 – 2022-02-23 (×4): 10 mg via ORAL
  Filled 2022-02-22 (×4): qty 2

## 2022-02-22 NOTE — Progress Notes (Addendum)
? ?Subjective: ? ?Overnight Events: No acute events or concerns overnight. ? ?The patient states that he is overall doing well. He does complain of some back pain that he states is similar to his chronic pain. He also complains of some mild chest pain that he describes as a muscle cramp that worsens when he presses on his chest. No other complaints. ? ?Objective: ? ?Vital signs in last 24 hours: ?Vitals:  ? 02/21/22 2119 02/22/22 0000 02/22/22 0354 02/22/22 0752  ?BP: (!) 141/85 (!) 107/58 (!) 109/58 123/67  ?Pulse: (!) 140 (!) 111 95 99  ?Resp: 18 18 18 18   ?Temp: 97.7 ?F (36.5 ?C) 97.8 ?F (36.6 ?C) 97.6 ?F (36.4 ?C) 97.6 ?F (36.4 ?C)  ?TempSrc: Oral Oral Oral Oral  ?SpO2: 98% 96% 97% 98%  ?Weight:      ?Height:      ? ?Weight change:  ? ?Intake/Output Summary (Last 24 hours) at 02/22/2022 1008 ?Last data filed at 02/22/2022 0355 ?Gross per 24 hour  ?Intake 1061.94 ml  ?Output 500 ml  ?Net 561.94 ml  ? ? ?Physical Exam ?  ?Constitutional: well appearing and sitting in bed, in no acute distress ?HENT: normocephalic atraumatic, mucous membranes moist ?Eyes: pupils equal and round, conjunctiva non-erythematous ?Cardiovascular: regular rate with normal rhythm, no m/r/g ?Pulmonary/Chest: normal work of breathing on room air, lungs clear to auscultation bilaterally. Mild tenderness to palpation over the left chest wall. ?Abdominal: soft, non-tender, non-distended, bowel sounds present ?MSK: normal bulk and tone. Left foot warmer than the right and is wrapped in a dressing with no drainage or blood noted. Mild surrounding erythema. ?Skin: warm and dry. ?Neurological: alert and answering questions appropriately. ?Psych: appropriate mood and affect  ? ?Assessment/Plan: ? ?Principal Problem: ?  Foreign body in foot, left, infected, initial encounter ? 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  ?  ?#Sepsis due to bilateral lower extremity Cellulitis ?#L and R foot multiple foreign bodies ?Patient presents with multiple wounds on the feet, found to have multiple small foreign bodies on XR of both feet. Patient reports mechanism was a mowing accident. Concern for some cellulitis given erythema and induration. Ortho has been consulted and state they will evaluate the patient today. On their initial review of the chart, they state they do not expect the patient to need surgical intervention. ?-Orthopedics consult pending ?-Continue broad spectrum antibiotics (Vancomycin, Rocephin, Flagyl) ?-f/u blood cultures ?-S/p 2L LR ?  ?#Sinus tachycardia with a history of venous thromboembolic disease (on warfarin) ?#AKI ?INR of 1.5 in the context of missing several days of medication with significant tachycardia and stable SoB/CP. No new oxygen requirement. CTPE obtained showing no PE. Etiology of tachycardia likely pain with some dehydration as creatinine improved from 2.5 to 1.7 yesterday with fluids. Baseline creatinine from 2 years ago was 0.88.  ?-Heparin gtt in place of warfarin given possible need for surgery ?-Echo ?-S/p 2L LR ?-Recheck CMP ?-Avoid lisinopril given AKI ?  ?#Chronic pain ?-Continue Percocet 10-650 q4hrs (consistent fills in PDMP), for 5 doses ?-Dilaudid 1 mg q3 hrs PRN ?-Continue home Diazepam at reduced dose of 1 mg q8hrs (consistent fills in PDMP) ?  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  ?-Semi-annual follow  up and referral to CT surgery ?  ?#Moderate aortic stenosis ?Echo from 2020 showing moderate aortic stenosis, normal EF.  ?-Encourage cardiology follow up ?-Echo today ?  ?#HTN ?Patient does not appear to be taking Toprol at home. Avoiding lisinopril d/t AKI. Last BP 144/75. ?  ?#GERD ?-Continue home Protonix ?   ?#HLD ?-Continue home Simvastatin ?  ?#BPH ?-Continue home Flomax ?  ?#PAD ?-Continue home Pletal ? ?Diet: Full ?VTE: Heparin gtt ?IVF: NS ?Code: Full ?  ?Prior to Admission Living Arrangement: Home ?Anticipated Discharge Location: TBD, likely home ?Barriers to Discharge: Ortho consult, active infection ?Dispo: Anticipated discharge in approximately 2-3 day(s).  ? ? LOS: 1 day  ? ?Bishop Limbo, Medical Student ?02/22/2022, 10:08 AM  ? ? ?I personally was present and performed or re-performed the history, physical exam and medical decision-making activities of this service and have verified that the service and findings are accurately documented in the student?s note. ? ?Carlyn Reichert, MD ?PGY-1 ? ?

## 2022-02-22 NOTE — Progress Notes (Signed)
Ortho Note ? ?Consult received. Foreign bodies from [place] accident now with tachycardia and concern for SIRS/sepsis. Reviewed imaging and clinical photos. Recommend IV antibiotics and monitoring of feet. I do not see any role for surgical intervention. Formal consult likely to follow later today. ? ?Shona Needles, MD ?[facility] ?[phone] (office) ?[email or web address] ? ?

## 2022-02-22 NOTE — Progress Notes (Signed)
ANTICOAGULATION CONSULT NOTE  ? ?Pharmacy Consult for Heparin, warfarin ?Indication: Recurrent VTE ? ?Allergies  ?Allergen Reactions  ? Codeine Hives and Other (See Comments)  ?  Takes benadryl to stop allergic reactions  ? ? ?Patient Measurements: ?Height: 6\' 1"  (185.4 cm) ?Weight: 93.4 kg (206 lb) ?IBW/kg (Calculated) : 79.9 ?Heparin Dosing Weight: 93.4 kg ? ?Vital Signs: ?Temp: 97.6 ?F (36.4 ?C) (05/08 [id number]) ?Temp Source: Oral (05/08 [id number]) ?BP: 123/67 (05/08 0752) ?Pulse Rate: 99 (05/08 0752) ? ?Labs: ?Recent Labs  ?  02/21/22 ?1358 02/21/22 ?1405 02/21/22 ?1608 02/22/22 ?0232 02/22/22 ?1157  ?HGB 15.3  --   --  13.7  --   ?HCT 46.2  --   --  40.8  --   ?PLT 374  --   --  285  --   ?APTT  --  34  --   --   --   ?LABPROT 17.6*  --   --  19.4*  --   ?INR 1.5*  --   --  1.7*  --   ?HEPARINUNFRC  --   --   --  0.51  --   ?CREATININE 2.50*  --  1.69* 1.71* 1.57*  ?TROPONINIHS  --  56* 48*  --   --   ? ? ? ?Estimated Creatinine Clearance: 45.2 mL/min (A) (by C-G formula based on SCr of 1.57 mg/dL (H)). ? ? ?Medical History: ?Past Medical History:  ?Diagnosis Date  ? AAA (abdominal aortic aneurysm) ([place])   ? Blood dyscrasia   ? followed by Dr. Benay Spice, for clotting issue, has been on Coumadin for about 20 yrs.    ? COPD (chronic obstructive pulmonary disease) ([place])   ? DJD (degenerative joint disease)   ? Dyslipidemia   ? Gout   ? Malignant hypertension   ? Peripheral vascular disease ([place])   ? Pneumonia 08/2013  ? pt. reports that he is having this surgery 11/25/2014- for the lung problem that began with pneumonia in 09/2014  ? Shortness of breath   ? 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 ? ?Medications:  ?Medications Prior to Admission  ?Medication Sig Dispense Refill Last Dose  ? acetaminophen (TYLENOL) 650 MG CR tablet Take 650 mg by mouth every 8 (eight) hours as needed for pain.   unk  ? albuterol (VENTOLIN HFA) 108 (90 Base) MCG/ACT inhaler Inhale 2 puffs into the lungs 5 (five) times daily.   02/21/2022 at am  ?  allopurinol (ZYLOPRIM) 300 MG tablet Take 300 mg by mouth at bedtime.   02/20/2022 at pm  ? cilostazol (PLETAL) 100 MG tablet Take 1 tablet (100 mg total) by mouth 2 (two) times daily. Please schedule appointment for refills. (Patient taking differently: Take 100 mg by mouth 2 (two) times daily.) 180 tablet 0 02/20/2022  ? cyclobenzaprine (FLEXERIL) 10 MG tablet Take 10 mg by mouth 3 (three) times daily as needed for muscle spasms.   02/21/2022 at am  ? diazepam (VALIUM) 2 MG tablet Take 2 mg by mouth 3 (three) times daily as needed for muscle spasms.   02/21/2022 at am  ? Fenofibrate 120 MG TABS Take 120 mg by mouth daily.    unk  ? omeprazole (PRILOSEC) 40 MG capsule Take 40 mg by mouth at bedtime.   02/20/2022 at pm  ? famotidine (PEPCID) 20 MG tablet Take 1 tablet (20 mg total) by mouth daily. 30 tablet 0 unk  ? hydrochlorothiazide (HYDRODIURIL) 25 MG tablet hydrochlorothiazide 25 mg tablet     ?  iron polysaccharides (NIFEREX) 150 MG capsule Take 1 capsule (150 mg total) by mouth daily. (Patient not taking: Reported on 02/21/2022) 30 capsule 0 Not Taking  ? lisinopril (ZESTRIL) 10 MG tablet Take 10 mg by mouth daily.     ? metoprolol succinate (TOPROL-XL) 100 MG 24 hr tablet Take 100 mg by mouth daily.    2021  ? oxyCODONE-acetaminophen (PERCOCET) 10-325 MG tablet Take 1 tablet by mouth.     ? senna-docusate (SENOKOT-S) 8.6-50 MG tablet Take 1 tablet by mouth at bedtime as needed for mild constipation. 30 tablet 0   ? simvastatin (ZOCOR) 40 MG tablet Take 1 tablet (40 mg total) by mouth daily at 6 PM. Need appointment for refills 30 tablet 0   ? tamsulosin (FLOMAX) 0.4 MG CAPS capsule Take 0.4 mg by mouth every evening.     ? warfarin (COUMADIN) 2 MG tablet Take 1 tablet (2 mg total) by mouth daily. (Patient taking differently: Take 2-4 mg by mouth See admin instructions. Take 2 mg on Mondays. Take 4 mg the rest of the week) 30 tablet 0   ? ? ?Scheduled:  ? acetaminophen  1,000 mg Oral Q8H  ? aspirin EC  81 mg Oral Daily  ?  cilostazol  100 mg Oral BID  ? metoprolol tartrate  12.5 mg Oral BID  ? metroNIDAZOLE  500 mg Oral Q12H  ? pantoprazole  40 mg Oral Daily  ? simvastatin  40 mg Oral q1800  ? tamsulosin  0.4 mg Oral QPM  ? ?Infusions:  ? cefTRIAXone (ROCEPHIN)  IV 2 g (02/22/22 0930)  ? heparin 1,600 Units/hr (02/22/22 0925)  ? [START ON 02/23/2022] vancomycin    ? ?PRN: albuterol, calcium carbonate, diazepam, HYDROmorphone (DILAUDID) injection, naLOXone (NARCAN)  injection, ondansetron (ZOFRAN) IV, oxyCODONE, senna-docusate ? 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 Patient is presenting with foot infection. Heparin per pharmacy consult placed for  Recurrent VTE, coumadin chronically may need procedure while inpt . ? ?Patient on warfarin pta. Per patient taking 2 mg on Mondays. Take 4 mg the rest of the week ? ?Now cleared to resume warfarin, INR 1.7 today, heparin level remains therapeutic on 1600 units/hr ? ?Goal of Therapy:  ?Heparin level 0.3-0.7 units/ml ?INR 2-3 ?Monitor platelets by anticoagulation protocol: Yes ?  ?Plan:  ?Warfarin 6mg  PO x 1 today ?Continue heparin gtt at 1600 units/hr ?Daily heparin level, INR, CBC, s/s bleeding ? ?Bertis Ruddy, PharmD ?Clinical Pharmacist ?ED Pharmacist Phone # [phone] ?02/22/2022 2:29 PM ? ? ? ?  ?

## 2022-02-22 NOTE — Consult Note (Signed)
Orthopaedic Trauma Service (OTS) Consult  ? ?Patient ID: ?Tony MinorsJerry F Mattioli ?MRN: [id number] ?DOB/AGE: 77/04/1946 77 y.o. ? ?Reason for Consult: Foot infection ?Referring Physician: Jodi GeraldsKelsey Ford, PA-C [facility]([place] emergency department) ? ?HPI: Tony Zhang is an 76 y.o. 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  The patient reports that two weeks ago he was mowing his lawn in his flip flops when a foreign object was thrown from the lawn mower and hit his L foot. He reports that soon afterwards he stepped on a nail with his other foot, which partially pierced his skin. He reports going to an urgent care where they took an X-ray of his foot, prescribed him antibiotics (unsure which one), and sent him home. He reportedly got a tetanus vaccine there.  ?  ?He reports that two days ago he began having nausea after eating breakfast. He vomited twice and went to the urgent care again. He reports they were concerned about his feet and his high heart rate and referred him to the emergency department. Orthopedics consulted for evaluation of bilateral feet. Patient seen this AM on 6N. Not having significant pain in his feet. Has some chronic back pain which bothers him. Denies any numbness or tingling throughout bilateral lower extremities. Vital signs are currently stable.   ? ?Past Medical History:  ?Diagnosis Date  ? AAA (abdominal aortic aneurysm) (HCC)   ? Blood dyscrasia   ? followed by Dr. [name], for clotting issue, has been on Coumadin for about 20 yrs.    ? COPD (chronic obstructive pulmonary disease) (HCC)   ? DJD (degenerative joint disease)   ? Dyslipidemia   ? Gout   ? Malignant hypertension   ? Peripheral vascular disease (HCC)   ? Pneumonia 08/2013  ? pt. reports  that he is having this surgery 11/25/2014- for the lung problem that began with pneumonia in 09/2014  ? Shortness of breath   ? 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 ? ?Past Surgical History:  ?Procedure Laterality Date  ? APPENDECTOMY    ? BACK SURGERY    ? COLONOSCOPY N/A 04/13/2015  ? Procedure: COLONOSCOPY;  Surgeon: Carman ChingJames Edwards, MD;  [facility];  Service: Endoscopy;  Laterality: N/A;  ? EMPYEMA DRAINAGE N/A 11/25/2014  ? Procedure: EMPYEMA DRAINAGE;  Surgeon: Delight OvensEdward B Gerhardt, MD;  [facility];  Service: Thoracic;  Laterality: N/A;  ? ESOPHAGOGASTRODUODENOSCOPY N/A 04/10/2015  ? Procedure: ESOPHAGOGASTRODUODENOSCOPY (EGD);  Surgeon: Charlott RakesVincent Schooler, MD;  [facility];  Service: Endoscopy;  Laterality: N/A;  ? EYE SURGERY    ? both eyes, cataracts removed, denies lens implants  ? FLEXIBLE SIGMOIDOSCOPY N/A 04/12/2015  ? Procedure: FLEXIBLE SIGMOIDOSCOPY;  Surgeon: Carman ChingJames Edwards, MD;  [facility];  Service: Endoscopy;  Laterality: N/A;  ? HERNIA REPAIR    ? SHOULDER SURGERY Right   ? VIDEO ASSISTED THORACOSCOPY Right 11/25/2014  ? Procedure: VIDEO ASSISTED THORACOSCOPY;  Surgeon: Delight OvensEdward B Gerhardt, MD;  [facility];  Service: Thoracic;  Laterality: Right;  ? VIDEO BRONCHOSCOPY N/A 11/25/2014  ? Procedure: VIDEO BRONCHOSCOPY;  Surgeon: Delight OvensEdward B Gerhardt, MD;  [facility];  Service: Thoracic;  Laterality: N/A;  ? ? ?Family History  ?Problem Relation Age of Onset  ? CAD Father 4150  ? Liver  cancer Brother   ? CAD Sister   ? ? ?Social History:  reports that he quit smoking about 29 years ago. He has never used smokeless tobacco. He reports that he does not drink alcohol and does not use drugs. ? ?Allergies:  ?Allergies  ?Allergen Reactions  ? Codeine Hives and Other (See Comments)  ?  Takes benadryl to stop allergic reactions  ? ? ?Medications: I have reviewed the patient's current medications. ?Prior to Admission:  ?Medications Prior to Admission  ?Medication Sig Dispense Refill Last  Dose  ? acetaminophen (TYLENOL) 650 MG CR tablet Take 650 mg by mouth every 8 (eight) hours as needed for pain.   unk  ? albuterol (VENTOLIN HFA) 108 (90 Base) MCG/ACT inhaler Inhale 2 puffs into the lungs 5 (five) times daily.   02/21/2022 at am  ? allopurinol (ZYLOPRIM) 300 MG tablet Take 300 mg by mouth at bedtime.   02/20/2022 at pm  ? cilostazol (PLETAL) 100 MG tablet Take 1 tablet (100 mg total) by mouth 2 (two) times daily. Please schedule appointment for refills. (Patient taking differently: Take 100 mg by mouth 2 (two) times daily.) 180 tablet 0 02/20/2022  ? cyclobenzaprine (FLEXERIL) 10 MG tablet Take 10 mg by mouth 3 (three) times daily as needed for muscle spasms.   02/21/2022 at am  ? diazepam (VALIUM) 2 MG tablet Take 2 mg by mouth 3 (three) times daily as needed for muscle spasms.   02/21/2022 at am  ? Fenofibrate 120 MG TABS Take 120 mg by mouth daily.    unk  ? omeprazole (PRILOSEC) 40 MG capsule Take 40 mg by mouth at bedtime.   02/20/2022 at pm  ? famotidine (PEPCID) 20 MG tablet Take 1 tablet (20 mg total) by mouth daily. 30 tablet 0 unk  ? hydrochlorothiazide (HYDRODIURIL) 25 MG tablet hydrochlorothiazide 25 mg tablet     ? iron polysaccharides (NIFEREX) 150 MG capsule Take 1 capsule (150 mg total) by mouth daily. (Patient not taking: Reported on 02/21/2022) 30 capsule 0 Not Taking  ? lisinopril (ZESTRIL) 10 MG tablet Take 10 mg by mouth daily.     ? metoprolol succinate (TOPROL-XL) 100 MG 24 hr tablet Take 100 mg by mouth daily.    2021  ? oxyCODONE-acetaminophen (PERCOCET) 10-325 MG tablet Take 1 tablet by mouth.     ? senna-docusate (SENOKOT-S) 8.6-50 MG tablet Take 1 tablet by mouth at bedtime as needed for mild constipation. 30 tablet 0   ? simvastatin (ZOCOR) 40 MG tablet Take 1 tablet (40 mg total) by mouth daily at 6 PM. Need appointment for refills 30 tablet 0   ? tamsulosin (FLOMAX) 0.4 MG CAPS capsule Take 0.4 mg by mouth every evening.     ? warfarin (COUMADIN) 2 MG tablet Take 1 tablet (2 mg  total) by mouth daily. (Patient taking differently: Take 2-4 mg by mouth See admin instructions. Take 2 mg on Mondays. Take 4 mg the rest of the week) 30 tablet 0   ? ? ?ROS: Constitutional: No fever or chills ?Vision: No changes in vision ?ENT: No difficulty swallowing ?CV: No chest pain ?Pulm: Hx of chronic SOB ?GI: No nausea or vomiting ?GU: No urgency or inability to hold urine ?Skin: No poor wound healing ?Neurologic: No numbness or tingling ?Psychiatric: No depression or anxiety ?Heme: No bruising ?Allergic: No reaction to medications or food ? ? ?Exam: ?Blood pressure 123/67, pulse 99, temperature 97.6 ?F (36.4 ?C), temperature source Oral, resp. rate 18, height 6'  1" (1.854 m), weight 93.4 kg, SpO2 98 %. ?General: No acute distress ?Orientation: Alert and oriented x3 ?Mood and Affect: Mood and affect appropriate, pleasant and cooperative ?Gait: WNL ?Coordination and balance: Within normal limits ? ?Left lower extremity: Diffuse swelling throughout the ankle and foot compared to contralateral limb. Stable eschar to medial ankle as well as to the lateral heel.  Foot is mildly erythematous compared to contralateral limb.  No significant tenderness with palpation about the foot or ankle.  Tolerates dorsiflexion and plantarflexion.  Strength equal to contralateral side.  Endorses sensation throughout extremity. + DP pulse ? ?RLE: Stable area of thickened skin at the level of the first MTP joint.  Otherwise, skin without lesions. No tenderness to palpation about the foot or throughout the ankle.  Minimal to no swelling throughout the extremity. Full painless ROM of the ankle, full strength in each muscle groups without evidence of instability.  Endorses sensation throughout extremity.  Neurovascularly intact ? ? ?Medical Decision Making: ?Data: ?Imaging: - AP and lateral views of the right foot shows a small 1-2 mm foreign body along the medial bottom of the foot. No fractures noted ?- AP and lateral views of the  left foot shows multiple foreign bodies in the mid and hindfoot. The largest foreign body measures 7 mm along the bottom of ?the foot. There are numerous tiny punctate foreign bodies extending from the me

## 2022-02-22 NOTE — Progress Notes (Signed)
ANTICOAGULATION CONSULT NOTE  ? ?Pharmacy Consult for Heparin ?Indication:  Recurrent VTE, coumadin chronically may need procedure while inpt ? ?Allergies  ?Allergen Reactions  ? Codeine Hives and Other (See Comments)  ?  Takes benadryl to stop allergic reactions  ? ? ?Patient Measurements: ?Height: 6\' 1"  (185.4 cm) ?Weight: 93.4 kg (206 lb) ?IBW/kg (Calculated) : 79.9 ?Heparin Dosing Weight: 93.4 kg ? ?Vital Signs: ?Temp: 97.8 ?F (36.6 ?C) (05/08 0000) ?Temp Source: Oral (05/08 0000) ?BP: 107/58 (05/08 0000) ?Pulse Rate: 111 (05/08 0000) ? ?Labs: ?Recent Labs  ?  02/21/22 ?1358 02/21/22 ?1405 02/21/22 ?1608 02/22/22 ?0232  ?HGB 15.3  --   --   --   ?HCT 46.2  --   --   --   ?PLT 374  --   --   --   ?APTT  --  34  --   --   ?LABPROT 17.6*  --   --  19.4*  ?INR 1.5*  --   --  1.7*  ?HEPARINUNFRC  --   --   --  0.51  ?CREATININE 2.50*  --  1.69* 1.71*  ?TROPONINIHS  --  56* 48*  --   ? ? ? ?Estimated Creatinine Clearance: 41.5 mL/min (A) (by C-G formula based on SCr of 1.71 mg/dL (H)). ? ? ?Medical History: ?Past Medical History:  ?Diagnosis Date  ? AAA (abdominal aortic aneurysm) (HCC)   ? Blood dyscrasia   ? followed by Dr. 04/24/22, for clotting issue, has been on Coumadin for about 20 yrs.    ? COPD (chronic obstructive pulmonary disease) (HCC)   ? DJD (degenerative joint disease)   ? Dyslipidemia   ? Gout   ? Malignant hypertension   ? Peripheral vascular disease (HCC)   ? Pneumonia 08/2013  ? pt. reports that he is having this surgery 11/25/2014- for the lung problem that began with pneumonia in 09/2014  ? Shortness of breath   ? 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 ? ?Medications:  ?Medications Prior to Admission  ?Medication Sig Dispense Refill Last Dose  ? acetaminophen (TYLENOL) 650 MG CR tablet Take 650 mg by mouth every 8 (eight) hours as needed for pain.   unk  ? albuterol (VENTOLIN HFA) 108 (90 Base) MCG/ACT inhaler Inhale 2 puffs into the lungs 5 (five) times daily.   02/21/2022 at am  ? allopurinol  (ZYLOPRIM) 300 MG tablet Take 300 mg by mouth at bedtime.   02/20/2022 at pm  ? cilostazol (PLETAL) 100 MG tablet Take 1 tablet (100 mg total) by mouth 2 (two) times daily. Please schedule appointment for refills. (Patient taking differently: Take 100 mg by mouth 2 (two) times daily.) 180 tablet 0 02/20/2022  ? cyclobenzaprine (FLEXERIL) 10 MG tablet Take 10 mg by mouth 3 (three) times daily as needed for muscle spasms.   02/21/2022 at am  ? diazepam (VALIUM) 2 MG tablet Take 2 mg by mouth 3 (three) times daily as needed for muscle spasms.   02/21/2022 at am  ? Fenofibrate 120 MG TABS Take 120 mg by mouth daily.    unk  ? omeprazole (PRILOSEC) 40 MG capsule Take 40 mg by mouth at bedtime.   02/20/2022 at pm  ? famotidine (PEPCID) 20 MG tablet Take 1 tablet (20 mg total) by mouth daily. 30 tablet 0 unk  ? hydrochlorothiazide (HYDRODIURIL) 25 MG tablet hydrochlorothiazide 25 mg tablet     ? iron polysaccharides (NIFEREX) 150 MG capsule Take 1 capsule (150 mg total)  by mouth daily. (Patient not taking: Reported on 02/21/2022) 30 capsule 0 Not Taking  ? lisinopril (ZESTRIL) 10 MG tablet Take 10 mg by mouth daily.     ? metoprolol succinate (TOPROL-XL) 100 MG 24 hr tablet Take 100 mg by mouth daily.    2021  ? oxyCODONE-acetaminophen (PERCOCET) 10-325 MG tablet Take 1 tablet by mouth.     ? senna-docusate (SENOKOT-S) 8.6-50 MG tablet Take 1 tablet by mouth at bedtime as needed for mild constipation. 30 tablet 0   ? simvastatin (ZOCOR) 40 MG tablet Take 1 tablet (40 mg total) by mouth daily at 6 PM. Need appointment for refills 30 tablet 0   ? tamsulosin (FLOMAX) 0.4 MG CAPS capsule Take 0.4 mg by mouth every evening.     ? warfarin (COUMADIN) 2 MG tablet Take 1 tablet (2 mg total) by mouth daily. (Patient taking differently: Take 2-4 mg by mouth See admin instructions. Take 2 mg on Mondays. Take 4 mg the rest of the week) 30 tablet 0   ? ? ?Scheduled:  ? aspirin EC  81 mg Oral Daily  ? cilostazol  100 mg Oral BID  ? metoprolol  tartrate  12.5 mg Oral BID  ? oxyCODONE-acetaminophen  2 tablet Oral Q4H  ? pantoprazole  40 mg Oral Daily  ? simvastatin  40 mg Oral q1800  ? tamsulosin  0.4 mg Oral QPM  ? vancomycin variable dose per unstable renal function (pharmacist dosing)   Does not apply See admin instructions  ? ?Infusions:  ? cefTRIAXone (ROCEPHIN)  IV    ? heparin 1,600 Units/hr (02/21/22 2124)  ? lactated ringers 150 mL/hr at 02/21/22 2127  ? ?PRN: albuterol, diazepam, HYDROmorphone (DILAUDID) injection, naLOXone (NARCAN)  injection, senna-docusate ? 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 Patient is presenting with foot infection. Heparin per pharmacy consult placed for  Recurrent VTE, coumadin chronically may need procedure while inpt . ? ?Patient on warfarin pta. Per patient taking 2 mg on Mondays. Take 4 mg the rest of the week ? ?Hgb 15.3; plt 374 ?aPTT 34; PT 17.6; INR 1.5 ? ? ?5/8 AM update:  ?Heparin level therapeutic ? ?Goal of Therapy:  ?Heparin level 0.3-0.7 units/ml ?Monitor platelets by anticoagulation protocol: Yes ?  ?Plan:  ?Cont heparin infusion at 1600 units/hr ?Check anti-Xa level in 8 hours \ ? ?Abran Duke, PharmD, BCPS ?Clinical Pharmacist ?Phone: [phone] ? ?  ?

## 2022-02-23 ENCOUNTER — Other Ambulatory Visit (HOSPITAL_COMMUNITY): Payer: Self-pay

## 2022-02-23 DIAGNOSIS — A419 Sepsis, unspecified organism: Principal | ICD-10-CM

## 2022-02-23 DIAGNOSIS — L089 Local infection of the skin and subcutaneous tissue, unspecified: Secondary | ICD-10-CM

## 2022-02-23 DIAGNOSIS — L0390 Cellulitis, unspecified: Secondary | ICD-10-CM

## 2022-02-23 DIAGNOSIS — I739 Peripheral vascular disease, unspecified: Secondary | ICD-10-CM

## 2022-02-23 DIAGNOSIS — S90852A Superficial foreign body, left foot, initial encounter: Secondary | ICD-10-CM

## 2022-02-23 LAB — CBC
HCT: 40.5 % (ref 39.0–52.0)
Hemoglobin: 13.1 g/dL (ref 13.0–17.0)
MCH: 31.3 pg (ref 26.0–34.0)
MCHC: 32.3 g/dL (ref 30.0–36.0)
MCV: 96.9 fL (ref 80.0–100.0)
Platelets: 238 10*3/uL (ref 150–400)
RBC: 4.18 MIL/uL — ABNORMAL LOW (ref 4.22–5.81)
RDW: 16.7 % — ABNORMAL HIGH (ref 11.5–15.5)
WBC: 5.4 10*3/uL (ref 4.0–10.5)
nRBC: 0 % (ref 0.0–0.2)

## 2022-02-23 LAB — BASIC METABOLIC PANEL
Anion gap: 6 (ref 5–15)
BUN: 23 mg/dL (ref 8–23)
CO2: 20 mmol/L — ABNORMAL LOW (ref 22–32)
Calcium: 9 mg/dL (ref 8.9–10.3)
Chloride: 110 mmol/L (ref 98–111)
Creatinine, Ser: 1.36 mg/dL — ABNORMAL HIGH (ref 0.61–1.24)
GFR, Estimated: 54 mL/min — ABNORMAL LOW (ref >60)
Glucose, Bld: 84 mg/dL (ref 70–99)
Potassium: 5.4 mmol/L — ABNORMAL HIGH (ref 3.5–5.1)
Sodium: 136 mmol/L (ref 135–145)

## 2022-02-23 LAB — HEPARIN LEVEL (UNFRACTIONATED): Heparin Unfractionated: 0.31 IU/mL (ref 0.30–0.70)

## 2022-02-23 LAB — PROTIME-INR
INR: 1.5 — ABNORMAL HIGH (ref 0.8–1.2)
Prothrombin Time: 18.4 seconds — ABNORMAL HIGH (ref 11.4–15.2)

## 2022-02-23 LAB — CALCIUM, IONIZED: Calcium, Ionized, Serum: 5.2 mg/dL (ref 4.5–5.6)

## 2022-02-23 MED ORDER — OXYCODONE-ACETAMINOPHEN 10-325 MG PO TABS
1.0000 | ORAL_TABLET | ORAL | 0 refills | Status: AC | PRN
  Filled 2022-02-23: qty 18, 3d supply, fill #0

## 2022-02-23 MED ORDER — ENOXAPARIN SODIUM 100 MG/ML IJ SOSY
100.0000 mg | PREFILLED_SYRINGE | Freq: Two times a day (BID) | INTRAMUSCULAR | Status: DC
  Administered 2022-02-23: 100 mg via SUBCUTANEOUS
  Filled 2022-02-23 (×2): qty 1

## 2022-02-23 MED ORDER — AMOXICILLIN-POT CLAVULANATE 875-125 MG PO TABS
1.0000 | ORAL_TABLET | Freq: Two times a day (BID) | ORAL | 0 refills | Status: AC
  Filled 2022-02-23: qty 20, 10d supply, fill #0

## 2022-02-23 MED ORDER — WARFARIN SODIUM 6 MG PO TABS
6.0000 mg | ORAL_TABLET | Freq: Once | ORAL | Status: DC
  Filled 2022-02-23: qty 1

## 2022-02-23 MED ORDER — WARFARIN SODIUM 4 MG PO TABS
4.0000 mg | ORAL_TABLET | Freq: Once | ORAL | Status: AC
  Administered 2022-02-23: 4 mg via ORAL
  Filled 2022-02-23 (×2): qty 1

## 2022-02-23 MED ORDER — ENOXAPARIN (LOVENOX) PATIENT EDUCATION KIT
PACK | Freq: Once | Status: DC
  Filled 2022-02-23: qty 1

## 2022-02-23 MED ORDER — SODIUM ZIRCONIUM CYCLOSILICATE 10 G PO PACK
10.0000 g | PACK | Freq: Once | ORAL | Status: AC
  Administered 2022-02-23: 10 g via ORAL
  Filled 2022-02-23: qty 1

## 2022-02-23 MED ORDER — ENOXAPARIN SODIUM 100 MG/ML IJ SOSY
100.0000 mg | PREFILLED_SYRINGE | Freq: Two times a day (BID) | INTRAMUSCULAR | 0 refills | Status: DC
Start: 2022-02-23 — End: 2022-06-18
  Filled 2022-02-23: qty 14, 7d supply, fill #0

## 2022-02-23 MED ORDER — VANCOMYCIN HCL 1500 MG/300ML IV SOLN
1500.0000 mg | INTRAVENOUS | Status: DC
  Filled 2022-02-23: qty 300

## 2022-02-23 NOTE — Discharge Instructions (Addendum)
You were hospitalized for a skin infection (cellulitis) likely caused by the accident you had while mowing. Your infection is improving on antibiotics, and it is important that you continue to take antibiotics as they are prescribed when you go home. ? ?Your warfarin was temporarily stopped and has now been resumed. You will need to take the prescribed Lovenox injections until you can get labs that show your INR is above 2. At that point you can stop taking the Lovenox injections. Please take your warfarin each day as prescribed starting tomorrow.   ? ?You also were found to have a fast heart rate (tachycardia) that returned to normal after we gave you some IV fluids and treated your pain. Please make sure to drink plenty of water. It is recommended for people to drink 8-10 glasses of water per day. Your labs during your visit indicated that you may have been slightly dehydrated when you came in to the hospital. Thank you for allowing [place] to be part of your care.  ? ?You informed us that you already have a follow-up appointment with a new primary care doctor on Thursday. Please keep this appointment as scheduled to continue your care.  ? ? ?Please note these changes made to your medications:  ? ?Please START taking: amoxicillin-clavulanate (AUGMENTIN) 875-125 MG tablet 2 times daily for 10 days, as well as Lovenox and warfarin.  ? ?Please continue to take any other home medications as they are prescribed.  ? ?Please call our clinic if you have any questions or concerns, we may be able to help and keep you from a long and expensive emergency room wait. Our clinic and after hours phone number is [phone], the best time to call is Monday through Friday 9 am to 4 pm but there is always someone available 24/7 if you have an emergency. If you need medication refills please notify your pharmacy one week in advance and they will send [place] a request.   ?

## 2022-02-23 NOTE — Progress Notes (Signed)
Mobility Specialist Progress Note: ? ? 02/23/22 1217  ?Mobility  ?Activity Ambulated with assistance in hallway  ?Level of Assistance Standby assist, set-up cues, supervision of patient - no hands on  ?Assistive Device Front wheel walker  ?Distance Ambulated (ft) 60 ft  ?Activity Response Tolerated well  ?$Mobility charge 1 Mobility  ? ?Pt received in bed willing to participate in mobility. Complaints of nausea. Left EOB with call bell in reach and all needs met.  ? ?Ciera Beckum ?Mobility Specialist ?Primary Phone [phone] ? ?

## 2022-02-23 NOTE — Discharge Summary (Addendum)
? ?Name: Tony MinorsJerry F Zhang ?MRN: [id number] ?DOB: 02-02-1946 76 y.o. ?PCP: [name], [name], MD ? ?Date of Admission: 02/21/2022  1:42 PM ?Date of Discharge: 02/23/22 ?Attending Physician: Gust RungHoffman, Erik C, DO ? ?Discharge Diagnosis: ?1. Sepsis due to bilateral lower extremity cellulitis ?2. Left and right foot with multiple foreign bodies ?3. Sinus tachycardia ?4. Acute Kidney Injury ?5. History of venous thromboembolic disease ?6. Chronic pain ?7. Abdominal aortic aneurysm ?8. Thoracic aortic aneurysm  ?9. Mild to moderate aortic stenosis ?10. Hypertension ?11. GERD ?12. Hyperlipidemia ?13. Peripheral artery disease ?14. Benign Prostatic Hypertrophy ? ?Discharge Medications: ?Allergies as of 02/23/2022   ? ?   Reactions  ? Codeine Hives, Other (See Comments)  ? Takes benadryl to stop allergic reactions  ? ?  ? ?  ?Medication List  ?  ? ?STOP taking these medications   ? ?iron polysaccharides 150 MG capsule ?Commonly known as: NIFEREX ?  ? ?  ? ?TAKE these medications   ? ?acetaminophen 650 MG CR tablet ?Commonly known as: TYLENOL ?Take 650 mg by mouth every 8 (eight) hours as needed for pain. ?  ?albuterol 108 (90 Base) MCG/ACT inhaler ?Commonly known as: VENTOLIN HFA ?Inhale 2 puffs into the lungs 5 (five) times daily. ?  ?allopurinol 300 MG tablet ?Commonly known as: ZYLOPRIM ?Take 300 mg by mouth at bedtime. ?  ?amoxicillin-clavulanate 875-125 MG tablet ?Commonly known as: Augmentin ?Take 1 tablet by mouth 2 (two) times daily for 10 days. ?  ?cilostazol 100 MG tablet ?Commonly known as: PLETAL ?Take 1 tablet (100 mg total) by mouth 2 (two) times daily. Please schedule appointment for refills. ?What changed: additional instructions ?  ?cyclobenzaprine 10 MG tablet ?Commonly known as: FLEXERIL ?Take 10 mg by mouth 3 (three) times daily as needed for muscle spasms. ?  ?diazepam 2 MG tablet ?Commonly known as: VALIUM ?Take 2 mg by mouth 3 (three) times daily as needed for muscle spasms. ?  ?enoxaparin 100 MG/ML  injection ?Commonly known as: LOVENOX ?Inject 1 mL (100 mg total) into the skin every 12 (twelve) hours for 7 days. Until INR is > 2 ?  ?famotidine 20 MG tablet ?Commonly known as: PEPCID ?Take 1 tablet (20 mg total) by mouth daily. ?  ?Fenofibrate 120 MG Tabs ?Take 120 mg by mouth daily. ?  ?hydrochlorothiazide 25 MG tablet ?Commonly known as: HYDRODIURIL ?Take 25 mg by mouth daily. ?  ?lisinopril 10 MG tablet ?Commonly known as: ZESTRIL ?Take 10 mg by mouth daily. ?  ?metoprolol succinate 100 MG 24 hr tablet ?Commonly known as: TOPROL-XL ?Take 100 mg by mouth daily. ?  ?omeprazole 40 MG capsule ?Commonly known as: PRILOSEC ?Take 40 mg by mouth at bedtime. ?  ?oxyCODONE-acetaminophen 10-325 MG tablet ?Commonly known as: Percocet ?Take 1 tablet by mouth every 4 (four) hours as needed for up to 3 days for pain. ?What changed:  ?when to take this ?reasons to take this ?  ?senna-docusate 8.6-50 MG tablet ?Commonly known as: Senokot-S ?Take 1 tablet by mouth at bedtime as needed for mild constipation. ?  ?simvastatin 40 MG tablet ?Commonly known as: ZOCOR ?Take 1 tablet (40 mg total) by mouth daily at 6 PM. Need appointment for refills ?  ?tamsulosin 0.4 MG Caps capsule ?Commonly known as: FLOMAX ?Take 0.4 mg by mouth every evening. ?  ?warfarin 2 MG tablet ?Commonly known as: Coumadin ?Take as directed. If you are unsure how to take this medication, talk to your nurse or doctor. ?Original instructions: Take 1 tablet (  2 mg total) by mouth daily. ?  ? ?  ? ? ?Disposition and follow-up:   ?[name] was discharged from [facility] in Stable condition.  At the hospital follow up visit please address: ? ?1.  Cellulitis of the lower extremity, INR/warfarin dosing, tachycardia, chronic pain ? ?2.  Labs / imaging needed at time of follow-up: BMP, PT/INR ? ?3.  Pending labs/ test needing follow-up: Blood cultures ? ?Follow-up Appointments: ?Patient reports PCP f/u on Thursday ? ?Hospital Course by  problem list: ?1. Sepsis with organ dysfunction due to bilateral lower extremity cellulitis ?Patient presented to the ED 2 weeks after he was mowing the grass in flip flops and felt a foreign body come out of the lawn mower and go into his foot. He was tachycardic and tachypnic with an AKI.  He states that shortly after he stepped on a sharp object with his right foot. He had multiple wounds on the feet, found to have multiple small foreign bodies on XR of both feet. His presentation was consistent with cellulitis given erythema and induration. Ortho saw the patient and do not see any indication for surgical intervention. The patient received Vancomycin, Rocephin, Flagyl during his hospital admission. Blood cultures negative at 2 days. Given low risk for MRSA infection, he can be sent home with Augmentin.  ? ?2. Left and right foot with multiple foreign bodies ?Patient with multiple foreign bodies in his bilateral feet secondary to a mowing accident as above. Ortho saw the patient and do not see any indication for surgical intervention. ? ? ?3.  Acute Kidney Injury ?Patient with creatinine of 2.5 on admission that has improved to 1.36 after getting IV fluids. According to his chart, the patient had a creatinine of 0.88 approximately 3 years ago, but it is difficult to tell what his baseline has been since that time given that we are unable to obtain PCP records and his PCP has since retired. We will encourage adequate fluid intake on discharge with follow-up with a new PCP that he has scheduled for Thursday. Lisinopril was held during his admission. We will resume this at discharge.  ? ?4. History of venous thromboembolic disease ?INR of 1.5 in the context of missing several days of medication with significant tachycardia and stable SoB/CP. No new oxygen requirement. CTPE obtained showing no PE. He was placed on heparin gtt given the possibility that he would need to go to the OR. Plan for Lovenox bridge until  patient's INR can become therapeutic. Recommend continuing Lovenox only until INR is therapeutic above 2. Patient has been given a 7 day supply.  ? ?5. Chronic pain ?The patient reports having chronic pain controlled with Tylenol, Norco, Valium as an outpatient. He was continued on this regimen during his hospital admission. He states that the physician who was prescribing his pain medication retired 2 weeks ago and he is out of his medication. PDMP shows consistent fills of Norco. We will give him a short course of pain medication until he goes to an appointment with a new PCP on Thursday.  ? ?6. 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 CT during this admission shows that it is unchanged from prior. Radiology recommended follow-up ultrasound every 2 years.  ? ?7. 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 Radiology recommended semi-annual imaging follow-up by CTA or MRA as well as referral to cardiothoracic surgery, we will  arrange this. ? ?8. Mild to moderate aortic stenosis ?Patient with known aortic stenosis from an echo in 2020 that showed moderate aortic stenosis. The patient's echo during this admission showed normal EF >75% and mild aortic stenosis with mild calcification and mild thickening of the valve. Cardiology is aware of the patient, and he will follow up with cardiothoracic surgery given aneurysms of the aortic arch and abdominal aorta. ? ?9. Hypertension ?Patient with longstanding hypertension who is supposed to be on Toprol and lisinopril at home, though it appeared on admission that he was not taking his Toprol, and his lisinopril was held here given AKI. His pressures during his hospital admission were elevated, most recently 177/79 this morning. We will resume his lisinopril at discharge, and he has a follow-up with a new PCP scheduled for Thursday. ? ?10. GERD ?Patient with longstanding GERD on Protonix at home.  This was continued during admission and will be continued at discharge.  ? ?11. Hyperlipidemia ?Patient with longstanding hyperlipidemia on Simvastatin at home. This was continued during admission and will be continued at disc

## 2022-02-23 NOTE — Progress Notes (Shared)
? ?Subjective: ? ?Overnight Events: No acute events or concerns overnight. ? ?Patient states he did not have a great night d/t significant pain in his feet. States he is out of his pain medications at home. Has an appointment with a FM doctor this coming Thursday. No other complaints or concerns at this time. ? ?Objective: ? ?Vital signs in last 24 hours: ?Vitals:  ? 02/22/22 1604 02/22/22 2010 02/23/22 0427 02/23/22 0748  ?BP: (!) 147/53 (!) 148/55 (!) 127/52 (!) 177/79  ?Pulse: 91 88 93 (!) 110  ?Resp: 18 16 17 17   ?Temp: 97.9 ?F (36.6 ?C) 98.3 ?F (36.8 ?C) 97.9 ?F (36.6 ?C) 98.1 ?F (36.7 ?C)  ?TempSrc: Oral Oral Oral Oral  ?SpO2: 98% 96% 97% 100%  ?Weight:      ?Height:      ? ?Weight change:  ? ?Intake/Output Summary (Last 24 hours) at 02/23/2022 0928 ?Last data filed at 02/23/2022 0751 ?Gross per 24 hour  ?Intake 2675.01 ml  ?Output 2550 ml  ?Net 125.01 ml  ? ? ? ?Physical Exam ?  ?Constitutional: well appearing and sitting in bed, in no acute distress ?HENT: normocephalic atraumatic, mucous membranes moist ?Eyes: pupils equal and round, conjunctiva non-erythematous ?Cardiovascular: regular rate with normal rhythm, no m/r/g ?Pulmonary/Chest: normal work of breathing on room air, lungs clear to auscultation bilaterally. Mild tenderness to palpation over the left chest wall. ?Abdominal: soft, non-tender, non-distended, bowel sounds present ?MSK: normal bulk and tone. Left foot warmer than the right and is wrapped in a dressing with no drainage or blood noted. Mild surrounding erythema. ?Skin: warm and dry. ?Neurological: alert and answering questions appropriately. ?Psych: appropriate mood and affect  ? ?Assessment/Plan: ? ?Principal Problem: ?  Sepsis due to cellulitis [facility]) ?Active Problems: ?  Peripheral vascular disease (HCC) ?  AKI (acute kidney injury) (HCC) ?  History of pulmonary embolism ?  Foreign body in foot, left, infected, initial encounter ? 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  ?  ?#Sepsis due to bilateral lower extremity Cellulitis ?#L and R foot multiple foreign bodies ?Patient presents with multiple wounds on the feet, found to have multiple small foreign bodies on XR of both feet. Patient reports mechanism was a mowing accident. Concern for some cellulitis given erythema and induration. Ortho has been consulted and state they will evaluate the patient today. On their initial review of the chart, they state they do not expect the patient to need surgical intervention. ?-Orthopedics consult pending ?-Continue broad spectrum antibiotics (Vancomycin, Rocephin, Flagyl) ?-f/u blood cultures ?-S/p 2L LR ?  ?#Sinus tachycardia with a history of venous thromboembolic disease (on warfarin) ?#AKI ?INR of 1.5 in the context of missing several days of medication with significant tachycardia and stable SoB/CP. No new oxygen requirement. CTPE obtained showing no PE. Etiology of tachycardia likely pain with some dehydration as creatinine improved from 2.5 to 1.7 yesterday with fluids. Baseline creatinine from 2 years ago was 0.88.  ?-Heparin gtt in place of warfarin given possible need for surgery ?-Echo ?-S/p 2L LR ?-Recheck CMP ?-Avoid lisinopril given AKI ?  ?#Chronic pain ?-Continue Percocet 10-650 q4hrs (consistent fills in PDMP), for 5 doses ?-Dilaudid 1 mg q3 hrs PRN ?-Continue home Diazepam at reduced dose of 1 mg q8hrs (consistent fills in PDMP) ?  ?#AAA ?3.5 cm suprarenal  AAA ?-f/u US q2 yrs ?  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  ?-Semi-annual follow up and referral to CT surgery ?  ?#Moderate aortic stenosis ?Echo from 2020 showing moderate aortic stenosis, normal EF.  ?-Encourage cardiology follow up ?-Echo today ?  ?#HTN ?Patient does not appear to  be taking Toprol at home. Avoiding lisinopril d/t AKI. Last BP 144/75. ?  ?#GERD ?-Continue home Protonix ?  ?#HLD ?-Continue home Simvastatin ?  ?#BPH ?-Continue home Flomax ?  ?#PAD ?-Continue home Pletal ? ?Diet: Full ?VTE: Heparin gtt ?IVF: NS ?Code: Full ?  ?Prior to Admission Living Arrangement: Home ?Anticipated Discharge Location: TBD, likely home ?Barriers to Discharge: Ortho consult, active infection ?Dispo: Anticipated discharge in approximately 2-3 day(s).  ? ? LOS: 2 days  ? ?[name], MD ?02/23/2022, 9:28 AM  ? ? ?I personally was present and performed or re-performed the history, physical exam and medical decision-making activities of this service and have verified that the service and findings are accurately documented in the student?s note. ? ?Carlyn Reichert, MD ?PGY-1 ? ?

## 2022-02-23 NOTE — Progress Notes (Incomplete Revision)
ANTICOAGULATION & ANTIBIOTIC CONSULT NOTE  ? ?Pharmacy Consult for Heparin, warfarin; Vancomycin ?Indication: Recurrent VTE; Foot infection ? ?Allergies  ?Allergen Reactions  ? Codeine Hives and Other (See Comments)  ?  Takes benadryl to stop allergic reactions  ? ? ?Patient Measurements: ?Height: 6\' 1"  (185.4 cm) ?Weight: 93.4 kg (206 lb) ?IBW/kg (Calculated) : 79.9 ?Heparin Dosing Weight: 93.4 kg ? ?Vital Signs: ?Temp: 98.1 ?F (36.7 ?C) (05/09 [id number]) ?Temp Source: Oral (05/09 [id number]) ?BP: 177/79 (05/09 0748) ?Pulse Rate: 110 (05/09 0748) ? ?Labs: ?Recent Labs  ?  02/21/22 ?1358 02/21/22 ?1405 02/21/22 ?1608 02/22/22 ?0232 02/22/22 ?1157 02/22/22 ?1637 02/23/22 ?0150  ?HGB 15.3  --   --  13.7  --   --  13.1  ?HCT 46.2  --   --  40.8  --   --  40.5  ?PLT 374  --   --  285  --   --  238  ?APTT  --  34  --   --   --   --   --   ?LABPROT 17.6*  --   --  19.4*  --   --  18.4*  ?INR 1.5*  --   --  1.7*  --   --  1.5*  ?HEPARINUNFRC  --   --   --  0.51 0.44  --  0.31  ?CREATININE 2.50*  --  1.69* 1.71* 1.57* 1.46* 1.36*  ?TROPONINIHS  --  56* 48*  --   --   --   --   ? ? ?Estimated Creatinine Clearance: 52.2 mL/min (A) (by C-G formula based on SCr of 1.36 mg/dL (H)). ? ? ?Medical History: ?Past Medical History:  ?Diagnosis Date  ? AAA (abdominal aortic aneurysm) ([place])   ? Blood dyscrasia   ? followed by Dr. Benay Spice, for clotting issue, has been on Coumadin for about 20 yrs.    ? COPD (chronic obstructive pulmonary disease) ([place])   ? DJD (degenerative joint disease)   ? Dyslipidemia   ? Gout   ? Malignant hypertension   ? Peripheral vascular disease ([place])   ? Pneumonia 08/2013  ? pt. reports that he is having this surgery 11/25/2014- for the lung problem that began with pneumonia in 09/2014  ? Shortness of breath   ? 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 ? ?Medications:  ?Medications Prior to Admission  ?Medication Sig Dispense Refill Last Dose  ? acetaminophen (TYLENOL) 650 MG CR tablet Take 650 mg by mouth every 8 (eight) hours  as needed for pain.   Past Week  ? albuterol (VENTOLIN HFA) 108 (90 Base) MCG/ACT inhaler Inhale 2 puffs into the lungs 5 (five) times daily.   02/21/2022 at am  ? allopurinol (ZYLOPRIM) 300 MG tablet Take 300 mg by mouth at bedtime.   02/21/2022 at pm  ? cilostazol (PLETAL) 100 MG tablet Take 1 tablet (100 mg total) by mouth 2 (two) times daily. Please schedule appointment for refills. (Patient taking differently: Take 100 mg by mouth 2 (two) times daily.) 180 tablet 0 02/21/2022  ? cyclobenzaprine (FLEXERIL) 10 MG tablet Take 10 mg by mouth 3 (three) times daily as needed for muscle spasms.   Past Month  ? diazepam (VALIUM) 2 MG tablet Take 2 mg by mouth 3 (three) times daily as needed for muscle spasms.   02/21/2022  ? famotidine (PEPCID) 20 MG tablet Take 1 tablet (20 mg total) by mouth daily. 30 tablet 0 Past Week  ? Fenofibrate 120 MG TABS  Take 120 mg by mouth daily.    Past Week  ? hydrochlorothiazide (HYDRODIURIL) 25 MG tablet Take 25 mg by mouth daily.   02/21/2022  ? lisinopril (ZESTRIL) 10 MG tablet Take 10 mg by mouth daily.   Past Week  ? metoprolol succinate (TOPROL-XL) 100 MG 24 hr tablet Take 100 mg by mouth daily.    02/21/2022 at 0500  ? omeprazole (PRILOSEC) 40 MG capsule Take 40 mg by mouth at bedtime.   Past Week  ? oxyCODONE-acetaminophen (PERCOCET) 10-325 MG tablet Take 1 tablet by mouth.   Past Week  ? senna-docusate (SENOKOT-S) 8.6-50 MG tablet Take 1 tablet by mouth at bedtime as needed for mild constipation. 30 tablet 0 Past Month  ? simvastatin (ZOCOR) 40 MG tablet Take 1 tablet (40 mg total) by mouth daily at 6 PM. Need appointment for refills 30 tablet 0 Past Week  ? tamsulosin (FLOMAX) 0.4 MG CAPS capsule Take 0.4 mg by mouth every evening.   02/21/2022  ? warfarin (COUMADIN) 2 MG tablet Take 1 tablet (2 mg total) by mouth daily. 30 tablet 0 Past Week at unk  ? iron polysaccharides (NIFEREX) 150 MG capsule Take 1 capsule (150 mg total) by mouth daily. (Patient not taking: Reported on 02/21/2022) 30  capsule 0 Not Taking  ? ? ?Scheduled:  ? acetaminophen  1,000 mg Oral Q8H  ? aspirin EC  81 mg Oral Daily  ? cilostazol  100 mg Oral BID  ? metoprolol tartrate  12.5 mg Oral BID  ? metroNIDAZOLE  500 mg Oral Q12H  ? pantoprazole  40 mg Oral Daily  ? simvastatin  40 mg Oral q1800  ? sodium zirconium cyclosilicate  10 g Oral Once  ? tamsulosin  0.4 mg Oral QPM  ? Warfarin - Pharmacist Dosing Inpatient   Does not apply [id number]  ? ?Infusions:  ? cefTRIAXone (ROCEPHIN)  IV 2 g (02/23/22 0846)  ? heparin 1,600 Units/hr (02/23/22 [id number])  ? vancomycin Stopped (02/23/22 0443)  ? ?PRN: albuterol, calcium carbonate, diazepam, HYDROmorphone (DILAUDID) injection, naLOXone (NARCAN)  injection, ondansetron (ZOFRAN) IV, oxyCODONE, senna-docusate ? 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 Patient is presenting with foot infection with small foreign body in medial bottom of foot. No surgical intervention at this time per Orthopedics. Heparin and Warfarin per pharmacy consult placed for  Recurrent VTE, coumadin chronically may need procedure while inpt . ? ?Patient on warfarin pta. Per patient taking 2 mg daily ? ?Today, INR down at 1.5 after 1 dose of Warfarin higher dose of 6mg .  ?Heparin level remains therapeutic on 1600 units/hr.  ? ?WBC is trending down and patient is afebrile.  ?SCr is much improved with good UOP recorded - will adjust Vancomycin dose.  ?Cultures remain negative to date x2 days.  ? ?Goal of Therapy:  ?Heparin level 0.3-0.7 units/ml ?INR 2-3 ?Monitor platelets by anticoagulation protocol: Yes ?  ?Plan:  ?Warfarin 4mg  PO x 1 again today ?Continue heparin gtt at 1600 units/hr ?Daily heparin level, INR, CBC, s/s bleeding ? ?Increase Vancomycin to 1500 mg IV every 24 hours for improving SCr and estimated AUC of 467.  ?Monitor renal function, clinical status, and  culture results.  ?Check Vancomycin levels as appropriate to guide dosing.  ? ?Sloan Leiter, PharmD, BCPS, BCCCP ?Clinical Pharmacist ?Please refer to [facility] for [place] numbers ?02/23/2022 9:39 AM ? ?Addendum: ?Called by IM  team - planning to discharge patient today with TOC. ?Requested that IV Heparin to change to Lovenox per pharmacy. ?Discontinue IV Heparin therapy.  ?Start Lovenox 100mg  SQ every 12 hours -- continue until INR >2.  ?Patient has outpatient follow-up on Thursday.  ?Recommend Patient to receive Warfarin 4mg  before discharge today then resume home regimen of 2mg  daily with INR check Thursday.  ? ?02/23/2022, 11:31 AM ? ? ?  ?

## 2022-02-23 NOTE — Progress Notes (Addendum)
ANTICOAGULATION & ANTIBIOTIC CONSULT NOTE  ? ?Pharmacy Consult for Heparin, warfarin; Vancomycin ?Indication: Recurrent VTE; Foot infection ? ?Allergies  ?Allergen Reactions  ? Codeine Hives and Other (See Comments)  ?  Takes benadryl to stop allergic reactions  ? ? ?Patient Measurements: ?Height: 6\' 1"  (185.4 cm) ?Weight: 93.4 kg (206 lb) ?IBW/kg (Calculated) : 79.9 ?Heparin Dosing Weight: 93.4 kg ? ?Vital Signs: ?Temp: 98.1 ?F (36.7 ?C) (05/09 [id number]) ?Temp Source: Oral (05/09 [id number]) ?BP: 177/79 (05/09 0748) ?Pulse Rate: 110 (05/09 0748) ? ?Labs: ?Recent Labs  ?  02/21/22 ?1358 02/21/22 ?1405 02/21/22 ?1608 02/22/22 ?0232 02/22/22 ?1157 02/22/22 ?1637 02/23/22 ?0150  ?HGB 15.3  --   --  13.7  --   --  13.1  ?HCT 46.2  --   --  40.8  --   --  40.5  ?PLT 374  --   --  285  --   --  238  ?APTT  --  34  --   --   --   --   --   ?LABPROT 17.6*  --   --  19.4*  --   --  18.4*  ?INR 1.5*  --   --  1.7*  --   --  1.5*  ?HEPARINUNFRC  --   --   --  0.51 0.44  --  0.31  ?CREATININE 2.50*  --  1.69* 1.71* 1.57* 1.46* 1.36*  ?TROPONINIHS  --  56* 48*  --   --   --   --   ? ? ?Estimated Creatinine Clearance: 52.2 mL/min (A) (by C-G formula based on SCr of 1.36 mg/dL (H)). ? ? ?Medical History: ?Past Medical History:  ?Diagnosis Date  ? AAA (abdominal aortic aneurysm) ([place])   ? Blood dyscrasia   ? followed by Dr. Benay Spice, for clotting issue, has been on Coumadin for about 20 yrs.    ? COPD (chronic obstructive pulmonary disease) ([place])   ? DJD (degenerative joint disease)   ? Dyslipidemia   ? Gout   ? Malignant hypertension   ? Peripheral vascular disease ([place])   ? Pneumonia 08/2013  ? pt. reports that he is having this surgery 11/25/2014- for the lung problem that began with pneumonia in 09/2014  ? Shortness of breath   ? 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 ? ?Medications:  ?Medications Prior to Admission  ?Medication Sig Dispense Refill Last Dose  ? acetaminophen (TYLENOL) 650 MG CR tablet Take 650 mg by mouth every 8 (eight) hours  as needed for pain.   Past Week  ? albuterol (VENTOLIN HFA) 108 (90 Base) MCG/ACT inhaler Inhale 2 puffs into the lungs 5 (five) times daily.   02/21/2022 at am  ? allopurinol (ZYLOPRIM) 300 MG tablet Take 300 mg by mouth at bedtime.   02/21/2022 at pm  ? cilostazol (PLETAL) 100 MG tablet Take 1 tablet (100 mg total) by mouth 2 (two) times daily. Please schedule appointment for refills. (Patient taking differently: Take 100 mg by mouth 2 (two) times daily.) 180 tablet 0 02/21/2022  ? cyclobenzaprine (FLEXERIL) 10 MG tablet Take 10 mg by mouth 3 (three) times daily as needed for muscle spasms.   Past Month  ? diazepam (VALIUM) 2 MG tablet Take 2 mg by mouth 3 (three) times daily as needed for muscle spasms.   02/21/2022  ? famotidine (PEPCID) 20 MG tablet Take 1 tablet (20 mg total) by mouth daily. 30 tablet 0 Past Week  ? Fenofibrate 120 MG TABS  Take 120 mg by mouth daily.    Past Week  ? hydrochlorothiazide (HYDRODIURIL) 25 MG tablet Take 25 mg by mouth daily.   02/21/2022  ? lisinopril (ZESTRIL) 10 MG tablet Take 10 mg by mouth daily.   Past Week  ? metoprolol succinate (TOPROL-XL) 100 MG 24 hr tablet Take 100 mg by mouth daily.    02/21/2022 at 0500  ? omeprazole (PRILOSEC) 40 MG capsule Take 40 mg by mouth at bedtime.   Past Week  ? oxyCODONE-acetaminophen (PERCOCET) 10-325 MG tablet Take 1 tablet by mouth.   Past Week  ? senna-docusate (SENOKOT-S) 8.6-50 MG tablet Take 1 tablet by mouth at bedtime as needed for mild constipation. 30 tablet 0 Past Month  ? simvastatin (ZOCOR) 40 MG tablet Take 1 tablet (40 mg total) by mouth daily at 6 PM. Need appointment for refills 30 tablet 0 Past Week  ? tamsulosin (FLOMAX) 0.4 MG CAPS capsule Take 0.4 mg by mouth every evening.   02/21/2022  ? warfarin (COUMADIN) 2 MG tablet Take 1 tablet (2 mg total) by mouth daily. 30 tablet 0 Past Week at unk  ? iron polysaccharides (NIFEREX) 150 MG capsule Take 1 capsule (150 mg total) by mouth daily. (Patient not taking: Reported on 02/21/2022) 30  capsule 0 Not Taking  ? ? ?Scheduled:  ? acetaminophen  1,000 mg Oral Q8H  ? aspirin EC  81 mg Oral Daily  ? cilostazol  100 mg Oral BID  ? metoprolol tartrate  12.5 mg Oral BID  ? metroNIDAZOLE  500 mg Oral Q12H  ? pantoprazole  40 mg Oral Daily  ? simvastatin  40 mg Oral q1800  ? sodium zirconium cyclosilicate  10 g Oral Once  ? tamsulosin  0.4 mg Oral QPM  ? Warfarin - Pharmacist Dosing Inpatient   Does not apply [id number]  ? ?Infusions:  ? cefTRIAXone (ROCEPHIN)  IV 2 g (02/23/22 0846)  ? heparin 1,600 Units/hr (02/23/22 [id number])  ? vancomycin Stopped (02/23/22 0443)  ? ?PRN: albuterol, calcium carbonate, diazepam, HYDROmorphone (DILAUDID) injection, naLOXone (NARCAN)  injection, ondansetron (ZOFRAN) IV, oxyCODONE, senna-docusate ? 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 Patient is presenting with foot infection with small foreign body in medial bottom of foot. No surgical intervention at this time per Orthopedics. Heparin and Warfarin per pharmacy consult placed for  Recurrent VTE, coumadin chronically may need procedure while inpt . ? ?Patient on warfarin pta. Per patient taking 2 mg daily ? ?Today, INR down at 1.5 after 1 dose of Warfarin higher dose of 6mg .  ?Heparin level remains therapeutic on 1600 units/hr.  ? ?WBC is trending down and patient is afebrile.  ?SCr is much improved with good UOP recorded - will adjust Vancomycin dose.  ?Cultures remain negative to date x2 days.  ? ?Goal of Therapy:  ?Heparin level 0.3-0.7 units/ml ?INR 2-3 ?Monitor platelets by anticoagulation protocol: Yes ?  ?Plan:  ?Warfarin 4mg  PO x 1 again today ?Continue heparin gtt at 1600 units/hr ?Daily heparin level, INR, CBC, s/s bleeding ? ?Increase Vancomycin to 1500 mg IV every 24 hours for improving SCr and estimated AUC of 467.  ?Monitor renal function, clinical status, and  culture results.  ?Check Vancomycin levels as appropriate to guide dosing.  ? ?Sloan Leiter, PharmD, BCPS, BCCCP ?Clinical Pharmacist ?Please refer to [facility] for [place] numbers ?02/23/2022 9:39 AM ? ?Addendum: ?Called by IM  team - planning to discharge patient today with TOC. ?Requested that IV Heparin to change to Lovenox per pharmacy. ?Discontinue IV Heparin therapy.  ?Start Lovenox 100mg  SQ every 12 hours -- continue until INR >2.  ?Patient has outpatient follow-up on Thursday.  ?Recommend Patient to receive Warfarin 4mg  before discharge today then resume home regimen of 2mg  daily with INR check Thursday.  ? ?02/23/2022, 11:31 AM ? ? ?  ?

## 2022-02-26 LAB — CULTURE, BLOOD (ROUTINE X 2)
Culture: NO GROWTH
Culture: NO GROWTH
Special Requests: ADEQUATE

## 2022-06-02 ENCOUNTER — Emergency Department (HOSPITAL_COMMUNITY): Payer: Medicare Other

## 2022-06-02 ENCOUNTER — Other Ambulatory Visit: Payer: Self-pay

## 2022-06-02 ENCOUNTER — Inpatient Hospital Stay (HOSPITAL_COMMUNITY)
Admission: EM | Admit: 2022-06-02 | Discharge: 2022-06-06 | DRG: 246 | Disposition: A | Discharge status: Home / Self Care | Payer: Medicare Other | Attending: Internal Medicine | Admitting: Internal Medicine

## 2022-06-02 DIAGNOSIS — I700 Atherosclerosis of aorta: Secondary | ICD-10-CM | POA: Diagnosis present

## 2022-06-02 DIAGNOSIS — J441 Chronic obstructive pulmonary disease with (acute) exacerbation: Secondary | ICD-10-CM | POA: Diagnosis present

## 2022-06-02 DIAGNOSIS — D6859 Other primary thrombophilia: Secondary | ICD-10-CM | POA: Diagnosis present

## 2022-06-02 DIAGNOSIS — I714 Abdominal aortic aneurysm, without rupture, unspecified: Secondary | ICD-10-CM | POA: Diagnosis present

## 2022-06-02 DIAGNOSIS — I10 Essential (primary) hypertension: Secondary | ICD-10-CM | POA: Diagnosis not present

## 2022-06-02 DIAGNOSIS — Z86711 Personal history of pulmonary embolism: Secondary | ICD-10-CM

## 2022-06-02 DIAGNOSIS — K219 Gastro-esophageal reflux disease without esophagitis: Secondary | ICD-10-CM | POA: Diagnosis present

## 2022-06-02 DIAGNOSIS — Z87891 Personal history of nicotine dependence: Secondary | ICD-10-CM

## 2022-06-02 DIAGNOSIS — Z7982 Long term (current) use of aspirin: Secondary | ICD-10-CM

## 2022-06-02 DIAGNOSIS — Z86718 Personal history of other venous thrombosis and embolism: Secondary | ICD-10-CM

## 2022-06-02 DIAGNOSIS — Z885 Allergy status to narcotic agent status: Secondary | ICD-10-CM

## 2022-06-02 DIAGNOSIS — I7122 Aneurysm of the aortic arch, without rupture: Secondary | ICD-10-CM | POA: Diagnosis present

## 2022-06-02 DIAGNOSIS — I160 Hypertensive urgency: Secondary | ICD-10-CM | POA: Diagnosis present

## 2022-06-02 DIAGNOSIS — G8929 Other chronic pain: Secondary | ICD-10-CM | POA: Diagnosis present

## 2022-06-02 DIAGNOSIS — Z955 Presence of coronary angioplasty implant and graft: Secondary | ICD-10-CM

## 2022-06-02 DIAGNOSIS — I350 Nonrheumatic aortic (valve) stenosis: Secondary | ICD-10-CM | POA: Diagnosis present

## 2022-06-02 DIAGNOSIS — J9601 Acute respiratory failure with hypoxia: Secondary | ICD-10-CM | POA: Diagnosis present

## 2022-06-02 DIAGNOSIS — Z79899 Other long term (current) drug therapy: Secondary | ICD-10-CM

## 2022-06-02 DIAGNOSIS — E785 Hyperlipidemia, unspecified: Secondary | ICD-10-CM | POA: Diagnosis present

## 2022-06-02 DIAGNOSIS — I701 Atherosclerosis of renal artery: Secondary | ICD-10-CM | POA: Diagnosis present

## 2022-06-02 DIAGNOSIS — I2584 Coronary atherosclerosis due to calcified coronary lesion: Secondary | ICD-10-CM | POA: Diagnosis present

## 2022-06-02 DIAGNOSIS — M109 Gout, unspecified: Secondary | ICD-10-CM | POA: Diagnosis present

## 2022-06-02 DIAGNOSIS — N400 Enlarged prostate without lower urinary tract symptoms: Secondary | ICD-10-CM

## 2022-06-02 DIAGNOSIS — J449 Chronic obstructive pulmonary disease, unspecified: Secondary | ICD-10-CM | POA: Diagnosis present

## 2022-06-02 DIAGNOSIS — J439 Emphysema, unspecified: Secondary | ICD-10-CM | POA: Diagnosis present

## 2022-06-02 DIAGNOSIS — I214 Non-ST elevation (NSTEMI) myocardial infarction: Principal | ICD-10-CM

## 2022-06-02 DIAGNOSIS — R739 Hyperglycemia, unspecified: Secondary | ICD-10-CM | POA: Diagnosis present

## 2022-06-02 DIAGNOSIS — Z8249 Family history of ischemic heart disease and other diseases of the circulatory system: Secondary | ICD-10-CM

## 2022-06-02 DIAGNOSIS — Z20822 Contact with and (suspected) exposure to covid-19: Secondary | ICD-10-CM | POA: Diagnosis present

## 2022-06-02 DIAGNOSIS — N179 Acute kidney failure, unspecified: Secondary | ICD-10-CM | POA: Diagnosis not present

## 2022-06-02 DIAGNOSIS — R079 Chest pain, unspecified: Secondary | ICD-10-CM

## 2022-06-02 DIAGNOSIS — I5033 Acute on chronic diastolic (congestive) heart failure: Secondary | ICD-10-CM | POA: Diagnosis present

## 2022-06-02 DIAGNOSIS — Z7901 Long term (current) use of anticoagulants: Secondary | ICD-10-CM

## 2022-06-02 DIAGNOSIS — Z8701 Personal history of pneumonia (recurrent): Secondary | ICD-10-CM

## 2022-06-02 DIAGNOSIS — I82409 Acute embolism and thrombosis of unspecified deep veins of unspecified lower extremity: Secondary | ICD-10-CM | POA: Diagnosis present

## 2022-06-02 DIAGNOSIS — I2510 Atherosclerotic heart disease of native coronary artery without angina pectoris: Secondary | ICD-10-CM | POA: Diagnosis present

## 2022-06-02 DIAGNOSIS — Z7984 Long term (current) use of oral hypoglycemic drugs: Secondary | ICD-10-CM

## 2022-06-02 DIAGNOSIS — R590 Localized enlarged lymph nodes: Secondary | ICD-10-CM

## 2022-06-02 DIAGNOSIS — I5032 Chronic diastolic (congestive) heart failure: Secondary | ICD-10-CM

## 2022-06-02 DIAGNOSIS — I5031 Acute diastolic (congestive) heart failure: Secondary | ICD-10-CM | POA: Diagnosis not present

## 2022-06-02 DIAGNOSIS — I110 Hypertensive heart disease with heart failure: Secondary | ICD-10-CM | POA: Diagnosis present

## 2022-06-02 DIAGNOSIS — Z800 Family history of malignant neoplasm of digestive organs: Secondary | ICD-10-CM

## 2022-06-02 DIAGNOSIS — I739 Peripheral vascular disease, unspecified: Secondary | ICD-10-CM | POA: Diagnosis present

## 2022-06-02 LAB — CBC WITH DIFFERENTIAL/PLATELET
Abs Immature Granulocytes: 0.1 10*3/uL — ABNORMAL HIGH (ref 0.00–0.07)
Basophils Absolute: 0 10*3/uL (ref 0.0–0.1)
Basophils Relative: 0 %
Eosinophils Absolute: 0.2 10*3/uL (ref 0.0–0.5)
Eosinophils Relative: 2 %
HCT: 48.8 % (ref 39.0–52.0)
Hemoglobin: 15.9 g/dL (ref 13.0–17.0)
Immature Granulocytes: 1 %
Lymphocytes Relative: 5 %
Lymphs Abs: 0.6 10*3/uL — ABNORMAL LOW (ref 0.7–4.0)
MCH: 32 pg (ref 26.0–34.0)
MCHC: 32.6 g/dL (ref 30.0–36.0)
MCV: 98.2 fL (ref 80.0–100.0)
Monocytes Absolute: 0.7 10*3/uL (ref 0.1–1.0)
Monocytes Relative: 6 %
Neutro Abs: 9.6 10*3/uL — ABNORMAL HIGH (ref 1.7–7.7)
Neutrophils Relative %: 86 %
Platelets: 259 10*3/uL (ref 150–400)
RBC: 4.97 MIL/uL (ref 4.22–5.81)
RDW: 15.5 % (ref 11.5–15.5)
WBC: 11.2 10*3/uL — ABNORMAL HIGH (ref 4.0–10.5)
nRBC: 0 % (ref 0.0–0.2)

## 2022-06-02 LAB — I-STAT VENOUS BLOOD GAS, ED
Acid-Base Excess: 0 mmol/L (ref 0.0–2.0)
Bicarbonate: 25.3 mmol/L (ref 20.0–28.0)
Calcium, Ion: 1 mmol/L — ABNORMAL LOW (ref 1.15–1.40)
HCT: 52 % (ref 39.0–52.0)
Hemoglobin: 17.7 g/dL — ABNORMAL HIGH (ref 13.0–17.0)
O2 Saturation: 100 %
Potassium: 5.1 mmol/L (ref 3.5–5.1)
Sodium: 135 mmol/L (ref 135–145)
TCO2: 27 mmol/L (ref 22–32)
pCO2, Ven: 41.4 mmHg — ABNORMAL LOW (ref 44–60)
pH, Ven: 7.394 (ref 7.25–7.43)
pO2, Ven: 178 mmHg — ABNORMAL HIGH (ref 32–45)

## 2022-06-02 LAB — BASIC METABOLIC PANEL
Anion gap: 12 (ref 5–15)
BUN: 13 mg/dL (ref 8–23)
CO2: 17 mmol/L — ABNORMAL LOW (ref 22–32)
Calcium: 8.8 mg/dL — ABNORMAL LOW (ref 8.9–10.3)
Chloride: 106 mmol/L (ref 98–111)
Creatinine, Ser: 0.91 mg/dL (ref 0.61–1.24)
GFR, Estimated: >60 mL/min (ref >60)
Glucose, Bld: 149 mg/dL — ABNORMAL HIGH (ref 70–99)
Potassium: 4.2 mmol/L (ref 3.5–5.1)
Sodium: 135 mmol/L (ref 135–145)

## 2022-06-02 LAB — LIPASE, BLOOD: Lipase: 25 U/L (ref 11–51)

## 2022-06-02 LAB — COMPREHENSIVE METABOLIC PANEL
ALT: 10 U/L (ref 0–44)
AST: 16 U/L (ref 15–41)
Albumin: 3.6 g/dL (ref 3.5–5.0)
Alkaline Phosphatase: 88 U/L (ref 38–126)
Anion gap: 14 (ref 5–15)
BUN: 13 mg/dL (ref 8–23)
CO2: 17 mmol/L — ABNORMAL LOW (ref 22–32)
Calcium: 8.9 mg/dL (ref 8.9–10.3)
Chloride: 105 mmol/L (ref 98–111)
Creatinine, Ser: 0.96 mg/dL (ref 0.61–1.24)
GFR, Estimated: >60 mL/min (ref >60)
Glucose, Bld: 151 mg/dL — ABNORMAL HIGH (ref 70–99)
Potassium: 4.1 mmol/L (ref 3.5–5.1)
Sodium: 136 mmol/L (ref 135–145)
Total Bilirubin: 1.6 mg/dL — ABNORMAL HIGH (ref 0.3–1.2)
Total Protein: 7.4 g/dL (ref 6.5–8.1)

## 2022-06-02 LAB — PROTIME-INR
INR: 1.2 (ref 0.8–1.2)
Prothrombin Time: 15.4 seconds — ABNORMAL HIGH (ref 11.4–15.2)

## 2022-06-02 LAB — RESP PANEL BY RT-PCR (FLU A&B, COVID) ARPGX2
Influenza A by PCR: NEGATIVE
Influenza B by PCR: NEGATIVE
SARS Coronavirus 2 by RT PCR: NEGATIVE

## 2022-06-02 LAB — BRAIN NATRIURETIC PEPTIDE: B Natriuretic Peptide: 1245.2 pg/mL — ABNORMAL HIGH (ref 0.0–100.0)

## 2022-06-02 LAB — TROPONIN I (HIGH SENSITIVITY)
Troponin I (High Sensitivity): 37 ng/L — ABNORMAL HIGH (ref <18)
Troponin I (High Sensitivity): 157 ng/L (ref <18)
Troponin I (High Sensitivity): 359 ng/L (ref <18)

## 2022-06-02 LAB — TSH: TSH: 0.818 u[IU]/mL (ref 0.350–4.500)

## 2022-06-02 LAB — LACTIC ACID, PLASMA
Lactic Acid, Venous: 1.8 mmol/L (ref 0.5–1.9)
Lactic Acid, Venous: 1.2 mmol/L (ref 0.5–1.9)

## 2022-06-02 LAB — SARS CORONAVIRUS 2 BY RT PCR: SARS Coronavirus 2 by RT PCR: NEGATIVE

## 2022-06-02 MED ORDER — FENOFIBRATE 160 MG PO TABS
160.0000 mg | ORAL_TABLET | Freq: Every day | ORAL | Status: DC
  Administered 2022-06-03 – 2022-06-06 (×4): 160 mg via ORAL
  Filled 2022-06-02 (×4): qty 1

## 2022-06-02 MED ORDER — ACETAMINOPHEN 325 MG PO TABS
650.0000 mg | ORAL_TABLET | Freq: Once | ORAL | Status: AC
Start: 2022-06-02 — End: 2022-06-02
  Administered 2022-06-02: 650 mg via ORAL
  Filled 2022-06-02: qty 2

## 2022-06-02 MED ORDER — IPRATROPIUM-ALBUTEROL 0.5-2.5 (3) MG/3ML IN SOLN
3.0000 mL | Freq: Four times a day (QID) | RESPIRATORY_TRACT | Status: AC
  Administered 2022-06-02: 3 mL via RESPIRATORY_TRACT
  Filled 2022-06-02: qty 42

## 2022-06-02 MED ORDER — CYCLOBENZAPRINE HCL 10 MG PO TABS
10.0000 mg | ORAL_TABLET | Freq: Three times a day (TID) | ORAL | Status: DC | PRN
  Administered 2022-06-03 – 2022-06-06 (×4): 10 mg via ORAL
  Filled 2022-06-02 (×4): qty 1

## 2022-06-02 MED ORDER — FAMOTIDINE 20 MG PO TABS
20.0000 mg | ORAL_TABLET | Freq: Once | ORAL | Status: AC
  Administered 2022-06-02: 20 mg via ORAL
  Filled 2022-06-02: qty 1

## 2022-06-02 MED ORDER — WARFARIN - PHARMACIST DOSING INPATIENT: Freq: Every day | Status: DC

## 2022-06-02 MED ORDER — FAMOTIDINE 20 MG PO TABS
20.0000 mg | ORAL_TABLET | Freq: Every day | ORAL | Status: DC
  Administered 2022-06-03 – 2022-06-06 (×4): 20 mg via ORAL
  Filled 2022-06-02 (×4): qty 1

## 2022-06-02 MED ORDER — PANTOPRAZOLE SODIUM 40 MG PO TBEC: 80.0000 mg | DELAYED_RELEASE_TABLET | Freq: Every day | ORAL | Status: DC

## 2022-06-02 MED ORDER — WARFARIN - PHARMACIST DOSING INPATIENT
Freq: Every day | Status: DC
Start: 2022-06-03 — End: 2022-06-02

## 2022-06-02 MED ORDER — HYDROCHLOROTHIAZIDE 25 MG PO TABS
25.0000 mg | ORAL_TABLET | Freq: Every day | ORAL | Status: DC
  Administered 2022-06-02: 25 mg via ORAL
  Filled 2022-06-02: qty 1

## 2022-06-02 MED ORDER — ONDANSETRON HCL 4 MG PO TABS: 4.0000 mg | ORAL_TABLET | Freq: Four times a day (QID) | ORAL | Status: DC | PRN

## 2022-06-02 MED ORDER — MORPHINE SULFATE (PF) 2 MG/ML IV SOLN
1.0000 mg | INTRAVENOUS | Status: DC | PRN
  Administered 2022-06-02 – 2022-06-06 (×10): 1 mg via INTRAVENOUS
  Filled 2022-06-02 (×10): qty 1

## 2022-06-02 MED ORDER — TAMSULOSIN HCL 0.4 MG PO CAPS
0.4000 mg | ORAL_CAPSULE | Freq: Every evening | ORAL | Status: DC
  Administered 2022-06-02 – 2022-06-05 (×3): 0.4 mg via ORAL
  Filled 2022-06-02 (×3): qty 1

## 2022-06-02 MED ORDER — ALBUTEROL SULFATE (2.5 MG/3ML) 0.083% IN NEBU
2.5000 mg | INHALATION_SOLUTION | RESPIRATORY_TRACT | Status: DC | PRN
Start: 2022-06-02 — End: 2022-06-06

## 2022-06-02 MED ORDER — LISINOPRIL 10 MG PO TABS
10.0000 mg | ORAL_TABLET | Freq: Once | ORAL | Status: AC
  Administered 2022-06-02: 10 mg via ORAL
  Filled 2022-06-02: qty 1

## 2022-06-02 MED ORDER — ONDANSETRON 4 MG PO TBDP
4.0000 mg | ORAL_TABLET | Freq: Once | ORAL | Status: DC | PRN
Start: 2022-06-02 — End: 2022-06-02

## 2022-06-02 MED ORDER — HEPARIN (PORCINE) 25000 UT/250ML-% IV SOLN
2000.0000 [IU]/h | INTRAVENOUS | Status: DC
  Administered 2022-06-02: 1600 [IU]/h via INTRAVENOUS
  Administered 2022-06-03: 1850 [IU]/h via INTRAVENOUS
  Administered 2022-06-03 – 2022-06-04 (×2): 2000 [IU]/h via INTRAVENOUS
  Filled 2022-06-02 (×4): qty 250

## 2022-06-02 MED ORDER — METOPROLOL SUCCINATE ER 100 MG PO TB24
100.0000 mg | ORAL_TABLET | Freq: Every day | ORAL | Status: DC
  Administered 2022-06-03 – 2022-06-06 (×4): 100 mg via ORAL
  Filled 2022-06-02: qty 1
  Filled 2022-06-02: qty 2
  Filled 2022-06-02: qty 4
  Filled 2022-06-02: qty 1

## 2022-06-02 MED ORDER — ACETAMINOPHEN 325 MG PO TABS
650.0000 mg | ORAL_TABLET | Freq: Four times a day (QID) | ORAL | Status: DC | PRN
  Administered 2022-06-03: 650 mg via ORAL
  Filled 2022-06-02: qty 2

## 2022-06-02 MED ORDER — IOHEXOL 350 MG/ML SOLN
100.0000 mL | Freq: Once | INTRAVENOUS | Status: AC | PRN
  Administered 2022-06-02: 100 mL via INTRAVENOUS

## 2022-06-02 MED ORDER — HYDRALAZINE HCL 20 MG/ML IJ SOLN
5.0000 mg | Freq: Once | INTRAMUSCULAR | Status: AC
  Administered 2022-06-02: 5 mg via INTRAVENOUS
  Filled 2022-06-02: qty 1

## 2022-06-02 MED ORDER — SODIUM CHLORIDE 0.9% FLUSH
3.0000 mL | Freq: Two times a day (BID) | INTRAVENOUS | Status: DC
  Administered 2022-06-03 – 2022-06-04 (×3): 3 mL via INTRAVENOUS

## 2022-06-02 MED ORDER — MORPHINE SULFATE (PF) 4 MG/ML IV SOLN
4.0000 mg | Freq: Once | INTRAVENOUS | Status: AC
  Administered 2022-06-02: 4 mg via INTRAVENOUS
  Filled 2022-06-02: qty 1

## 2022-06-02 MED ORDER — ONDANSETRON 4 MG PO TBDP
4.0000 mg | ORAL_TABLET | Freq: Once | ORAL | Status: AC
  Administered 2022-06-02: 4 mg via ORAL
  Filled 2022-06-02: qty 1

## 2022-06-02 MED ORDER — ONDANSETRON HCL 4 MG/2ML IJ SOLN
4.0000 mg | Freq: Four times a day (QID) | INTRAMUSCULAR | Status: DC | PRN
  Administered 2022-06-03: 4 mg via INTRAVENOUS
  Filled 2022-06-02: qty 2

## 2022-06-02 MED ORDER — FENOFIBRATE 160 MG PO TABS: 160.0000 mg | ORAL_TABLET | Freq: Every day | ORAL | Status: DC

## 2022-06-02 MED ORDER — OXYCODONE HCL 5 MG PO TABS
15.0000 mg | ORAL_TABLET | Freq: Four times a day (QID) | ORAL | Status: DC | PRN
  Filled 2022-06-02 (×2): qty 3

## 2022-06-02 MED ORDER — PANTOPRAZOLE SODIUM 40 MG PO TBEC
80.0000 mg | DELAYED_RELEASE_TABLET | Freq: Every day | ORAL | Status: DC
  Administered 2022-06-03 – 2022-06-06 (×5): 80 mg via ORAL
  Filled 2022-06-02 (×5): qty 2

## 2022-06-02 MED ORDER — HEPARIN BOLUS VIA INFUSION
5000.0000 [IU] | Freq: Once | INTRAVENOUS | Status: AC
  Administered 2022-06-02: 5000 [IU] via INTRAVENOUS
  Filled 2022-06-02: qty 5000

## 2022-06-02 MED ORDER — CILOSTAZOL 100 MG PO TABS
100.0000 mg | ORAL_TABLET | Freq: Two times a day (BID) | ORAL | Status: DC
  Administered 2022-06-02 – 2022-06-06 (×8): 100 mg via ORAL
  Filled 2022-06-02 (×10): qty 1

## 2022-06-02 MED ORDER — WARFARIN SODIUM 7.5 MG PO TABS
7.5000 mg | ORAL_TABLET | Freq: Once | ORAL | Status: AC
  Administered 2022-06-02: 7.5 mg via ORAL
  Filled 2022-06-02: qty 1

## 2022-06-02 MED ORDER — FUROSEMIDE 10 MG/ML IJ SOLN
20.0000 mg | Freq: Once | INTRAMUSCULAR | Status: AC
  Administered 2022-06-02: 20 mg via INTRAVENOUS
  Filled 2022-06-02: qty 2

## 2022-06-02 MED ORDER — ALLOPURINOL 300 MG PO TABS
300.0000 mg | ORAL_TABLET | Freq: Every day | ORAL | Status: DC
  Administered 2022-06-03 – 2022-06-05 (×3): 300 mg via ORAL
  Filled 2022-06-02 (×4): qty 1

## 2022-06-02 MED ORDER — HYDRALAZINE HCL 20 MG/ML IJ SOLN
5.0000 mg | Freq: Four times a day (QID) | INTRAMUSCULAR | Status: DC | PRN
  Administered 2022-06-03: 5 mg via INTRAVENOUS
  Filled 2022-06-02 (×2): qty 1

## 2022-06-02 MED ORDER — ACETAMINOPHEN 650 MG RE SUPP: 650.0000 mg | Freq: Four times a day (QID) | RECTAL | Status: DC | PRN

## 2022-06-02 MED ORDER — SODIUM CHLORIDE 0.9% FLUSH: 3.0000 mL | INTRAVENOUS | Status: DC | PRN

## 2022-06-02 MED ORDER — LISINOPRIL 10 MG PO TABS
10.0000 mg | ORAL_TABLET | Freq: Every day | ORAL | Status: DC
Start: 2022-06-03 — End: 2022-06-04
  Administered 2022-06-03: 10 mg via ORAL
  Filled 2022-06-02: qty 1

## 2022-06-02 MED ORDER — SODIUM CHLORIDE 0.9 % IV SOLN: 250.0000 mL | INTRAVENOUS | Status: DC | PRN

## 2022-06-02 NOTE — ED Triage Notes (Signed)
BIB [facility] from home.  SHOB/CP/Nausea x 1 month. Hx of pna.  Decreased lower right lung sounds.  Requires O2 with EMS doesn't normally wear.  Has had a duoneb and 125 solu medrol en route.  95% on 4L.  178/90, HR 93, NSR, 97.8, 18G RFA.  Alert and oriented.

## 2022-06-02 NOTE — Assessment & Plan Note (Signed)
No evidence of an exacerbation on my exam. He denies any wheezing/tightness/cough Continue albuterol prn Schedule duoneb x 2 Hold on any steroids as not indicated at this time

## 2022-06-02 NOTE — Assessment & Plan Note (Addendum)
Interval enlargement of prostate: The prostate measures up to 6.5 cm in greatest axial dimension, previously 5.6 cm. Denies any urinary issues  Continue flomax

## 2022-06-02 NOTE — Assessment & Plan Note (Addendum)
-  CTA chest/abdo/pelvis negative for acute aortic syndrome -troponin elevated from 37>157>359. HTN urgency vs. nstemi vs. Demand ischemia  -cardiology consulted-poor candidate for invasive therapy. Continue medical management. Continue to follow recommendations  -echo pending -continue to trend troponin  -start heparin gtt since INR subtherapeutic  -blood pressure control with home medication of toprol 100mg  daily, hctz 25mg  daily, lisinopril 10mg  daily and prn hydralazine  -lipid panel in AM, unsure why not on statin and he can not tell me

## 2022-06-02 NOTE — Assessment & Plan Note (Addendum)
Interval enlargement, indeterminate significance.  History of chills/night sweats, no fevers  Further work up per pcp

## 2022-06-02 NOTE — Assessment & Plan Note (Signed)
Continue fenofibrate Check lipid panel  He does not recall ever being on a statin

## 2022-06-02 NOTE — ED Notes (Signed)
Pt BP cuff readjusted and correct BP validated

## 2022-06-02 NOTE — ED Provider Triage Note (Cosign Needed Addendum)
Emergency Medicine Provider Triage Evaluation Note  Tony Zhang , a 76 y.o. male  was evaluated in triage.  Pt complains of 1 month of chest pain, shortness of breath and nausea.  Says recently he has also been having fevers, chills and myalgias.  No known sick contacts.  History of recurrent pneumonia.  Has a history of heart failure and COPD but does not wear oxygen at baseline.  Has been using his albuterol inhaler more and it is not working.  Review of Systems  Positive: CP, sob, nausea Negative:   Physical Exam  BP (!) 189/83 (BP Location: Left Arm)   Pulse 91   Temp (!) 97.3 F (36.3 C) (Oral)   Resp 18   SpO2 97%  Gen:   Awake, no distress   Resp:  Normal effort  MSK:   Moves extremities without difficulty  Other:  Decreased right sided breath sounds.  No rhonchi's or rales.  Tachycardic, normal rhythm  Medical Decision Making  Medically screening exam initiated at 7:46 AM.  Appropriate orders placed.  Tony Zhang was informed that the remainder of the evaluation will be completed by another provider, this initial triage assessment does not replace that evaluation, and the importance of remaining in the ED until their evaluation is complete.    Patient is satting at 21 on room air.  He does not wear oxygen at home.  Oxygen at 97 on 5 L.  Nursing staff notified that patient needs room for new oxygen requirement.   [name], PA-C 06/02/22 0748    [name], PA-C 06/02/22 [phone]

## 2022-06-02 NOTE — Assessment & Plan Note (Addendum)
Patient presenting with HTN urgency, chest pain, elevated BNP >1000, findings on CXR or pulmonary edema and bilateral pleural effusions and acute respiratory failure with hypoxia  -gentle diuresis with 20mg  IV lasix today, titrate as indicated -repeat echo pending -echo 5/23: EF >75%. Grade 1 DD. Mild AS.  -strict I/O and daily weights -continue lisinopril, toprol and blood pressure control

## 2022-06-02 NOTE — Assessment & Plan Note (Addendum)
76 year old presenting with one month of intermittent chest pain and worsening shortness of breath with exertion found to be hypoxic on room air and requiring 3-4L oxygen to maintain oxygenation.  -admit to progressive  -likely multifactorial including acute on chronic diastolic CHF, HTN urgency with possible flash pulmonary edema and elevated troponin rule out ACS  -gentle diuresis/repeat echo/strict I/O/daily weights -blood pressure control -COPD stable on my exam, no wheezing or exacerbation at this time  -wean as tolerated

## 2022-06-02 NOTE — Assessment & Plan Note (Signed)
No evidence of flair Continue allopurinol

## 2022-06-02 NOTE — H&P (Addendum)
History and Physical    Patient: Tony Zhang ZDG:[id number] DOB: 05-Nov-1945 DOA: 06/02/2022 DOS: the patient was seen and examined on 06/02/2022 PCP: [name], MD  Patient coming from: Home - lives alone.    Chief Complaint: epigastric/chest pain with shortness of breath   HPI: Tony Zhang is a 76 y.o. male with medical history significant of COPD, HLD, HTN, PVD, hx of DVT/PE on coumadin, diastolic CHF, AAA, bilateral renal artery stenosis, chronic pain who presented to ED with complaints of intermittent chest pain x 1 month. He thought he had pulled a muscle, but has gotten worse. He states it's worse with inspiration with a deep breath and he has worsening dyspnea on exertion and at times chest pain. Pain is more in his epigastric area. Pain rated as an 8-9/10 and described as burning/stabbing. Radiates up his mid chest. Duration depends on how "out of breath he is." Typically 5-10 minutes until he can get his breathing under control. He wears compression hose and doesn't feel like he has any LE edema. No known weight gain. He denies any orthopnea.   Denies any fever, but has had chills, vision changes/headaches, chest pain, no cough,  N/V/D, dysuria or leg swelling.   He does not smoke or drink alcohol.   ER Course:  vitals: afebrile, bp: 189/83, HR: 91, RR: 18, oxygen: 97% on [place] Pertinent labs:  troponin 37>157>  , wbc: 11.2, bnp: 1245, INR: 1.2,  CXR: Increased bibasilar interstitial densities are noted most consistent with pulmonary edema. Small bilateral pleural effusions are noted as Well CTA chest/abdo for dissection: no acute aortic syndrome.  2. 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 3. 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 -interval enlargement of mediastinal lymphadenopathy of indeterminate significance. Prostatomeglay.  In ED: cardiology consulted. Given  pepcid and hydralazine, zofran, morphine and home anti-HTN meds. TRH asked to admit.     Review of Systems: As mentioned in the history of present illness. All other systems reviewed and are negative. Past Medical History:  Diagnosis Date   AAA (abdominal aortic aneurysm) (HCC)    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     COPD (chronic obstructive pulmonary disease) (HCC)    DJD (degenerative joint disease)    Dyslipidemia    Gout    Malignant hypertension    Peripheral vascular disease (HCC)    Pneumonia 08/2013   pt. reports that he is having this surgery 11/25/2014- for the lung problem that began with pneumonia in 09/2014   Shortness of breath    Venous thrombosis    Recurrent   Past Surgical History:  Procedure Laterality Date   APPENDECTOMY     BACK SURGERY     COLONOSCOPY N/A 04/13/2015   Procedure: COLONOSCOPY;  Surgeon: Carman Ching, MD;  Location: [facility] ENDOSCOPY;  Service: Endoscopy;  Laterality: N/A;   EMPYEMA DRAINAGE N/A 11/25/2014   Procedure: EMPYEMA DRAINAGE;  Surgeon: Delight Ovens, MD;  Location: [facility] OR;  Service: Thoracic;  Laterality: N/A;   ESOPHAGOGASTRODUODENOSCOPY N/A 04/10/2015   Procedure: ESOPHAGOGASTRODUODENOSCOPY (EGD);  Surgeon: Charlott Rakes, MD;  Location: [facility] ENDOSCOPY;  Service: Endoscopy;  Laterality: N/A;   EYE SURGERY     both eyes, cataracts removed, denies lens implants   FLEXIBLE SIGMOIDOSCOPY N/A 04/12/2015   Procedure: FLEXIBLE SIGMOIDOSCOPY;  Surgeon: Carman Ching, MD;  Location:  [facility] ENDOSCOPY;  Service: Endoscopy;  Laterality: N/A;   HERNIA REPAIR     SHOULDER SURGERY Right    VIDEO ASSISTED THORACOSCOPY Right 11/25/2014   Procedure: VIDEO ASSISTED THORACOSCOPY;  Surgeon: Delight Ovens, MD;  Location: [facility] OR;  Service: Thoracic;  Laterality: Right;   VIDEO BRONCHOSCOPY N/A 11/25/2014   Procedure: VIDEO BRONCHOSCOPY;  Surgeon: Delight Ovens, MD;  Location: [facility] OR;   Service: Thoracic;  Laterality: N/A;   Social History:  reports that he quit smoking about 29 years ago. He has never used smokeless tobacco. He reports that he does not drink alcohol and does not use drugs.  Allergies  Allergen Reactions   Codeine Hives and Other (See Comments)    Takes benadryl to stop allergic reactions    Family History  Problem Relation Age of Onset   CAD Father 39   Liver cancer Brother    CAD Sister     Prior to Admission medications   Medication Sig Start Date End Date Taking? Authorizing Provider  albuterol (VENTOLIN HFA) 108 (90 Base) MCG/ACT inhaler Inhale 2 puffs into the lungs 5 (five) times daily.   Yes [provider]  allopurinol (ZYLOPRIM) 300 MG tablet Take 300 mg by mouth at bedtime.   Yes [provider]  cilostazol (PLETAL) 100 MG tablet Take 1 tablet (100 mg total) by mouth 2 (two) times daily. Please schedule appointment for refills. Patient taking differently: Take 100 mg by mouth 2 (two) times daily. 03/07/17  Yes Croitoru, Mihai, MD  cyclobenzaprine (FLEXERIL) 10 MG tablet Take 10 mg by mouth 3 (three) times daily as needed for muscle spasms.   Yes [provider]  famotidine (PEPCID) 20 MG tablet Take 1 tablet (20 mg total) by mouth daily. 08/14/19  Yes Sheikh, Omair Latif, DO  Fenofibrate 120 MG TABS Take 120 mg by mouth daily.    Yes [provider]  hydrochlorothiazide (HYDRODIURIL) 25 MG tablet Take 25 mg by mouth daily in the afternoon.   Yes [provider]  lisinopril (ZESTRIL) 10 MG tablet Take 10 mg by mouth daily in the afternoon.   Yes [provider]  metoprolol succinate (TOPROL-XL) 100 MG 24 hr tablet Take 100 mg by mouth daily.    Yes [provider]  naloxone (NARCAN) nasal spray 4 mg/0.1 mL Place 4 mg into the nose as needed. 05/26/22  Yes [provider]  omeprazole (PRILOSEC) 40 MG capsule Take 40 mg by mouth at bedtime.   Yes [provider]   ondansetron (ZOFRAN) 8 MG tablet Take 8 mg by mouth 2 (two) times daily as needed for vomiting or nausea. 05/25/22  Yes [provider]  oxyCODONE (ROXICODONE) 15 MG immediate release tablet Take 15 mg by mouth 4 (four) times daily as needed for pain. 05/26/22  Yes [provider]  tamsulosin (FLOMAX) 0.4 MG CAPS capsule Take 0.4 mg by mouth every evening.   Yes [provider]  warfarin (COUMADIN) 5 MG tablet Take 5 mg by mouth See admin instructions. Take 1 tablet (5 mg) on Tuesday, Wednesday, Thursday, Saturday, Sunday. On Monday and Friday do not take. Or as directed by Coumadin clinic.   Yes [provider]  enoxaparin (LOVENOX) 100 MG/ML injection Inject 1 mL (100 mg total) into the skin every 12 (twelve) hours for 7 days. Until INR is > 2 Patient not taking: Reported on 06/02/2022 02/23/22 03/02/22  Carlyn Reichert, MD  warfarin (COUMADIN) 2 MG tablet Take 1 tablet (  2 mg total) by mouth daily. Patient not taking: Reported on 06/02/2022 08/14/15   Ladell Pier, MD    Physical Exam: Vitals:   06/02/22 1915 06/02/22 1931 06/02/22 1932 06/02/22 2030  BP:   (!) 168/75 (!) 143/63  Pulse: 85  90 84  Resp: (!) 23  20 (!) 24  Temp:  97.7 F (36.5 C)    TempSrc:  Oral    SpO2: 94%  92% 93%   General:  Appears calm and comfortable and is in NAD Eyes:  PERRL, EOMI, normal lids, iris ENT:  grossly normal hearing, lips & tongue, mmm; appropriate dentition Neck:  no LAD, masses or thyromegaly; no carotid bruits Cardiovascular:  RRR, +systolic murmur. No LE edema.  Respiratory:   bibasilar crackles.  no wheezes/rales/rhonchi.  Normal respiratory effort. Abdomen:  soft,TTP diffusely. No rebound or guarding. BS+  Back:   normal alignment, no CVAT Skin:  no rash or induration seen on limited exam Musculoskeletal:  grossly normal tone BUE/BLE, good ROM, no bony abnormality. +reproducible chest pain  Lower extremity:  No LE edema.  Limited foot exam with no  ulcerations.  2+ distal pulses. Psychiatric:  grossly normal mood and affect, speech fluent and appropriate, AOx3 Neurologic:  CN 2-12 grossly intact, moves all extremities in coordinated fashion, sensation intact   Radiological Exams on Admission: Independently reviewed - see discussion in A/P where applicable  CT Angio Chest/Abd/Pel for Dissection W and/or Wo Contrast  Result Date: 06/02/2022 CLINICAL DATA:  76 year old male with chest and abdominal pain, concern for acute aortic syndrome. EXAM: CT ANGIOGRAPHY CHEST, ABDOMEN AND PELVIS TECHNIQUE: Non-contrast CT of the chest was initially obtained. Multidetector CT imaging through the chest, abdomen and pelvis was performed using the standard protocol during bolus administration of intravenous contrast. Multiplanar reconstructed images and MIPs were obtained and reviewed to evaluate the vascular anatomy. RADIATION DOSE REDUCTION: This exam was performed according to the departmental dose-optimization program which includes automated exposure control, adjustment of the mA and/or kV according to patient size and/or use of iterative reconstruction technique. CONTRAST:  140mL OMNIPAQUE IOHEXOL 350 MG/ML SOLN COMPARISON:  Chest CT from 02/21/2022, chest abdomen pelvis from 11/09/2018 FINDINGS: CTA CHEST FINDINGS VASCULAR Preferential opacification of the thoracic aorta. No evidence of thoracic aortic aneurysm or dissection. Diffuse circumferential coronary atherosclerotic calcifications about the aortic arch and descending thoracic aorta. Normal heart size. No pericardial effusion. Sinues of Valsalva: 31 mm 32 x 31 mm ,unchanged Sinotubular Junction: 32 mm ,unchanged Ascending Aorta: 37 mm ,unchanged Aortic Arch: Fusiform aneurysmal dilation of zone 2 measuring up to 39 mm ,unchanged Descending aorta: 28 mm at the level of the carina ,unchanged Branch vessels: Conventional branching pattern. Ostial atherosclerotic changes without evidence of flow-limiting  stenosis. Coronary arteries: Normal origins and courses. Severe atherosclerotic calcifications. Main pulmonary artery: 33 mm ,unchanged. No evidence of central pulmonary embolism. Pulmonary veins: No anomalous pulmonary venous return. No evidence of left atrial appendage thrombus. NON VASCULAR Mediastinum/Nodes: Interval enlargement of previously visualized prominent mediastinal lymph nodes, the largest in the aortopulmonic window measuring up to 1.7 cm in short axis, previously 1.1 cm. Lungs/Pleura: Severe upper lobe predominant centrilobular emphysema. Diffuse bronchial wall thickening. Interval development trace, right greater than pleural effusions. Bibasilar subsegmental atelectasis. Musculoskeletal: No chest wall abnormality. No acute or significant osseous findings. Review of the MIP images confirms the above findings. CTA ABDOMEN AND PELVIS FINDINGS VASCULAR Aorta: Circumferential fibrofatty and calcific atherosclerotic about the suprarenal abdominal aorta which is normal in caliber and patent.  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 Celiac: Mild ostial stenosis, likely secondary to median arcuate ligament compression. Patent distally. SMA: Moderate proximal stenosis secondary to atherosclerotic plaque. Patent distally. Renals: Single bilateral renal arteries with moderate ostial stenosis secondary to atherosclerotic plaque. IMA: Occluded. Inflow: Similar appearing total occlusion of the common and external iliac arteries which reconstitute distally via the inferior epigastric arteries. Internal iliac reconstitution via prominent arc of [place]. Veins: Duplicated IVC. Limited evaluation of abdominopelvic venous patency secondary to arterial phase image acquisition Review of the MIP images confirms the above findings. NON-VASCULAR Hepatobiliary: Unchanged 1.5 cm right lobe posterior simple hepatic cysts. The liver is otherwise normal in size, contour, and attenuation. The gallbladder is present  with a single punctate partially calcified gallstone near the infundibulum, no surrounding inflammatory changes. No intra or extrahepatic biliary ductal dilation. Pancreas: Unremarkable. No pancreatic ductal dilatation or surrounding inflammatory changes. Spleen: Normal in size without focal abnormality. Adrenals/Urinary Tract: Adrenal glands are unremarkable. Unchanged appearance of multifocal bilateral simple appearing renal cysts. Kidneys are otherwise normal, without renal calculi, focal lesion, or hydronephrosis. Bladder is unremarkable. Stomach/Bowel: Stomach is within normal limits. Appendix is not definitively identified. Redundant sigmoid colon. No evidence of bowel wall thickening, distention, or inflammatory changes. Lymphatic: No abdominopelvic lymphadenopathy. Reproductive: The prostate measures up to 6.5 cm in greatest axial dimension, previously 5.6 cm. Other: No abdominal wall hernia or abnormality. No abdominopelvic ascites. Musculoskeletal: No acute or significant osseous findings. IMPRESSION: VASCULAR 1. No evidence of acute aortic syndrome. 2. 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 3. 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 Recommend annual imaging followup by CTA or MRA. This recommendation follows 2010 ACCF/AHA/AATS/ACR/ASA/SCA/SCAI/SIR/STS/SVM Guidelines for the Diagnosis and Management of Patients with Thoracic Aortic Disease. Circulation.2010; [id number]. Aortic aneurysm NOS (ICD10-I71.9) 4.  Coronary and aortic Atherosclerosis (ICD10-I70.0). NON-VASCULAR 1. Interval enlargement of mediastinal lymphadenopathy of indeterminate significance. 2. Trace right greater than left pleural effusions. 3. Similar appearing severe emphysema (ICD10-J43.9). 4. Prostatomegaly. Ruthann Cancer, MD [facility] Radiology Electronically Signed   By:  Ruthann Cancer M.D.   On: 06/02/2022 13:48   DG Chest 2 View  Result Date: 06/02/2022 CLINICAL DATA:  Shortness of breath, chest pain. EXAM: CHEST - 2 VIEW COMPARISON:  Feb 21, 2022. FINDINGS: The heart size and mediastinal contours are within normal limits. Increased interstitial densities are noted in both lung bases consistent with pulmonary edema. Small bilateral pleural effusions are noted. The visualized skeletal structures are unremarkable. IMPRESSION: Increased bibasilar interstitial densities are noted most consistent with pulmonary edema. Small bilateral pleural effusions are noted as well. Electronically Signed   By: Marijo Conception M.D.   On: 06/02/2022 08:13    EKG: Independently reviewed.  NSR with rate 91; nonspecific ST changes with no evidence of acute ischemia   Labs on Admission: I have personally reviewed the available labs and imaging studies at the time of the admission.  Pertinent labs:   troponin 37>157>   wbc: 11.2,  bnp: 1245,  INR: 1.2,   Assessment and Plan: Principal Problem:   Acute respiratory failure with hypoxia (HCC) Active Problems:   Hypertensive urgency with elevated troponins and chest pain    Acute on chronic diastolic CHF (congestive heart failure) (HCC)   Enlarged prostate   Mediastinal lymphadenopathy   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    DVT, lower extremity and pulmonary embolism, recurrent  COPD (chronic obstructive pulmonary disease) (HCC)   Peripheral vascular disease (HCC)   Hyperlipidemia   Gout   Chest pain/epigastric pain     Assessment and Plan: * Acute respiratory failure with hypoxia ([place]) 76 year old presenting with one month of intermittent chest pain and worsening shortness of breath with exertion found to be hypoxic on room air and requiring 3-4L oxygen to maintain oxygenation.  -admit to progressive  -likely multifactorial including acute on chronic diastolic CHF, HTN urgency with possible flash  pulmonary edema and elevated troponin rule out ACS  -gentle diuresis/repeat echo/strict I/O/daily weights -blood pressure control -COPD stable on my exam, no wheezing or exacerbation at this time  -wean as tolerated    Hypertensive urgency with elevated troponins and chest pain  -CTA chest/abdo/pelvis negative for acute aortic syndrome -troponin elevated from 37>157>359. HTN urgency vs. nstemi vs. Demand ischemia  -cardiology consulted-poor candidate for invasive therapy. Continue medical management. Continue to follow recommendations  -echo pending -continue to trend troponin  -start heparin gtt since INR subtherapeutic  -blood pressure control with home medication of toprol 100mg  daily, hctz 25mg  daily, lisinopril 10mg  daily and prn hydralazine  -lipid panel in AM, unsure why not on statin and he can not tell me    Acute on chronic diastolic CHF (congestive heart failure) ([place]) Patient presenting with HTN urgency, chest pain, elevated BNP >1000, findings on CXR or pulmonary edema and bilateral pleural effusions and acute respiratory failure with hypoxia  -gentle diuresis with 20mg  IV lasix today, titrate as indicated -repeat echo pending -echo 5/23: EF >75%. Grade 1 DD. Mild AS.  -strict I/O and daily weights -continue lisinopril, toprol and blood pressure control   Enlarged prostate Interval enlargement of prostate: The prostate measures up to 6.5 cm in greatest axial dimension, previously 5.6 cm. Denies any urinary issues  Continue flomax   Mediastinal lymphadenopathy Interval enlargement, indeterminate significance.  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 Continue annual f/u  Suprarenal abdominal aortic aneurysm, measuring up to 3.5 cm (from CT abdomen in 5/23) continue regular f/u   DVT, lower extremity and  pulmonary embolism, recurrent INR 1.2, subtherapeutic>heparing gtt with chest pain/elevated troponin   warfarin per pharmacy dosing   COPD (chronic obstructive pulmonary disease) ([place]) No evidence of an exacerbation on my exam. He denies any wheezing/tightness/cough Continue albuterol prn Schedule duoneb x 2 Hold on any steroids as not indicated at this time   Peripheral vascular disease ([place]) Continue pletal, fenofibrate, coumadin Unsure why not on statin,  He can not tell me   Hyperlipidemia Continue fenofibrate Check lipid panel  He does not recall ever being on a statin   Gout No evidence of flair Continue allopurinol     Advance Care Planning:   Code Status: Full Code   Consults: cardiology   DVT Prophylaxis: heparin gtt   Family Communication: none   Severity of Illness: The appropriate patient status for this patient is INPATIENT. Inpatient status is judged to be reasonable and necessary in order to provide the required intensity of service to ensure the patient's safety. The patient's presenting symptoms, physical exam findings, and initial radiographic and laboratory data in the context of their chronic comorbidities is felt to place them at high risk for further clinical deterioration. Furthermore, it is not anticipated that the patient will be medically stable for  discharge from the hospital within 2 midnights of admission.   * I certify that at the point of admission it is my clinical judgment that the patient will require inpatient hospital care spanning beyond 2 midnights from the point of admission due to high intensity of service, high risk for further deterioration and high frequency of surveillance required.*  Author: Orma Flaming, MD 06/02/2022 8:46 PM  For on call review [email or web address].

## 2022-06-02 NOTE — Progress Notes (Addendum)
ANTICOAGULATION CONSULT NOTE - Initial Consult  Pharmacy Consult for warfarin > heparin Indication: Hx DVT/PE   Allergies  Allergen Reactions   Codeine Hives and Other (See Comments)    Takes benadryl to stop allergic reactions    Patient Measurements:    Vital Signs: Temp: 98.3 F (36.8 C) (08/16 1537) Temp Source: Oral (08/16 0945) BP: 188/107 (08/16 1530) Pulse Rate: 78 (08/16 1530)  Labs: Recent Labs    06/02/22 0752 06/02/22 0953 06/02/22 1215 06/02/22 1235 06/02/22 1530  HGB 15.9  --   --  17.7*  --   HCT 48.8  --   --  52.0  --   PLT 259  --   --   --   --   LABPROT  --   --  15.4*  --   --   INR  --   --  1.2  --   --   CREATININE 0.91  --  0.96  --   --   TROPONINIHS 37* 157*  --   --  359*    CrCl cannot be calculated (Unknown ideal weight.).   Medical History: Past Medical History:  Diagnosis Date   AAA (abdominal aortic aneurysm) (HCC)    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     COPD (chronic obstructive pulmonary disease) (HCC)    DJD (degenerative joint disease)    Dyslipidemia    Gout    Malignant hypertension    Peripheral vascular disease (HCC)    Pneumonia 08/2013   pt. reports that he is having this surgery 11/25/2014- for the lung problem that began with pneumonia in 09/2014   Shortness of breath    Venous thrombosis    Recurrent    Assessment: 75 YOM presenting with SOB, hx DVT on warfarin PTA, last dose 8/15 and INR on admission is 1.2  PTA dosing: 5mg  daily except Mon and Fri no doses  Goal of Therapy:  INR 2-3 Monitor platelets by anticoagulation protocol: Yes   Plan:  Warfarin 7.5mg  PO x 1 today Daily INR, s/s bleeding  Addendum: Switch to heparin gtt with increasing troponin, subtherapeutic INR Heparin 5000 units IV x 1, and gtt at 1600 units/hr F/u 6 hour heparin level F/u cards plan and transition back to warfarin  Tue, PharmD Clinical  Pharmacist ED Pharmacist Phone # [phone] 06/02/2022 4:47 PM

## 2022-06-02 NOTE — ED Provider Notes (Signed)
[facility]PART[facility] Provider Note   CSN: [id number] Arrival date & time: 06/02/22  0734     History  Chief Complaint  Patient presents with   Chest Pain   Shortness of Breath    Tony Zhang is a 76 y.o. 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  Shortness of breath and chest pain became severe this morning around 5 AM.  Chest pain is intermittent.  Poorly characterized.  Worse with deep breath.  Shortness of breath is constant.  Not associated with cough.  Abdominal pain is right lower quadrant primarily, but also diffuse.  Associated with nausea.  No diarrhea or constipation.  Patient also endorses chills and night sweats ongoing for 1 month.    Medical history: DVT, previously on warfarin PVD Chronic pain on prescription opioids Hypertension GERD  Social history: [place] Does not use alcohol, tobacco, or drugs.  Chest Pain Associated symptoms: shortness of breath   Shortness of Breath Associated symptoms: chest pain        Home Medications Prior to Admission medications   Medication Sig Start Date End Date Taking? Authorizing Provider  albuterol (VENTOLIN HFA) 108 (90 Base) MCG/ACT inhaler Inhale 2 puffs into the lungs 5 (five) times daily.   Yes [provider]  allopurinol (ZYLOPRIM) 300 MG tablet Take 300 mg by mouth at bedtime.   Yes [provider]  cilostazol (PLETAL) 100 MG tablet Take 1 tablet (100 mg total) by mouth 2 (two) times daily. Please schedule appointment for refills. Patient taking differently: Take 100 mg by mouth 2 (two) times daily. 03/07/17  Yes Croitoru, Mihai, MD  cyclobenzaprine (FLEXERIL) 10 MG tablet Take 10 mg by mouth 3 (three) times daily as needed for muscle spasms.   Yes [provider]  famotidine (PEPCID) 20 MG tablet Take 1  tablet (20 mg total) by mouth daily. 08/14/19  Yes Sheikh, Omair Latif, DO  Fenofibrate 120 MG TABS Take 120 mg by mouth daily.    Yes [provider]  hydrochlorothiazide (HYDRODIURIL) 25 MG tablet Take 25 mg by mouth daily in the afternoon.   Yes [provider]  lisinopril (ZESTRIL) 10 MG tablet Take 10 mg by mouth daily in the afternoon.   Yes [provider]  metoprolol succinate (TOPROL-XL) 100 MG 24 hr tablet Take 100 mg by mouth daily.    Yes [provider]  naloxone (NARCAN) nasal spray 4 mg/0.1 mL Place 4 mg into the nose as needed. 05/26/22  Yes [provider]  omeprazole (PRILOSEC) 40 MG capsule Take 40 mg by mouth at bedtime.   Yes [provider]  ondansetron (ZOFRAN) 8 MG tablet Take 8 mg by mouth 2 (two) times daily as needed for vomiting or nausea. 05/25/22  Yes [provider]  oxyCODONE (ROXICODONE) 15 MG immediate release tablet Take 15 mg by mouth 4 (four) times daily as needed for pain. 05/26/22  Yes [provider]  tamsulosin (FLOMAX) 0.4 MG CAPS capsule Take 0.4 mg by mouth every evening.   Yes [provider]  warfarin (COUMADIN) 5 MG tablet Take 5 mg by mouth See admin instructions. Take 1 tablet (5 mg) on Tuesday, Wednesday, Thursday, Saturday, Sunday. On Monday and Friday do not take. Or as directed by Coumadin clinic.   Yes [provider]  enoxaparin (LOVENOX) 100 MG/ML  injection Inject 1 mL (100 mg total) into the skin every 12 (twelve) hours for 7 days. Until INR is > 2 Patient not taking: Reported on 06/02/2022 02/23/22 03/02/22  Corky Sox, MD  warfarin (COUMADIN) 2 MG tablet Take 1 tablet (2 mg total) by mouth daily. Patient not taking: Reported on 06/02/2022 08/14/15   Ladell Pier, MD      Allergies    Codeine    Review of Systems   Review of Systems  Respiratory:  Positive for shortness of breath.   Cardiovascular:  Positive for chest pain.  All other systems  reviewed and are negative.   Physical Exam Updated Vital Signs BP (!) 188/107   Pulse 78   Temp 98.3 F (36.8 C)   Resp 15   SpO2 93%  Physical Exam Vitals and nursing note reviewed.  Constitutional:      General: He is not in acute distress.    Appearance: He is well-developed.  HENT:     Head: Normocephalic and atraumatic.  Eyes:     Conjunctiva/sclera: Conjunctivae normal.  Neck:     Vascular: No hepatojugular reflux or JVD.  Cardiovascular:     Rate and Rhythm: Normal rate and regular rhythm.     Heart sounds: No murmur heard. Pulmonary:     Effort: Pulmonary effort is normal. Tachypnea present. No respiratory distress.     Comments: Bibasilar crackles. Abdominal:     Palpations: Abdomen is soft.     Tenderness: There is abdominal tenderness (Diffuse.).  Musculoskeletal:        General: No swelling.     Cervical back: Neck supple.     Right lower leg: No tenderness. No edema.     Left lower leg: No tenderness. No edema.  Skin:    General: Skin is warm and dry.     Capillary Refill: Capillary refill takes less than 2 seconds.     Coloration: Skin is pale.  Neurological:     General: No focal deficit present.     Mental Status: He is alert.  Psychiatric:        Mood and Affect: Mood is anxious.     ED Results / Procedures / Treatments   Labs (all labs ordered are listed, but only abnormal results are displayed) Labs Reviewed  CBC WITH DIFFERENTIAL/PLATELET - Abnormal; Notable for the following components:      Result Value   WBC 11.2 (*)    Neutro Abs 9.6 (*)    Lymphs Abs 0.6 (*)    Abs Immature Granulocytes 0.10 (*)    All other components within normal limits  BASIC METABOLIC PANEL - Abnormal; Notable for the following components:   CO2 17 (*)    Glucose, Bld 149 (*)    Calcium 8.8 (*)    All other components within normal limits  BRAIN NATRIURETIC PEPTIDE - Abnormal; Notable for the following components:   B Natriuretic Peptide 1,245.2 (*)     All other components within normal limits  PROTIME-INR - Abnormal; Notable for the following components:   Prothrombin Time 15.4 (*)    All other components within normal limits  COMPREHENSIVE METABOLIC PANEL - Abnormal; Notable for the following components:   CO2 17 (*)    Glucose, Bld 151 (*)    Total Bilirubin 1.6 (*)    All other components within normal limits  I-STAT VENOUS BLOOD GAS, ED - Abnormal; Notable for the following components:   pCO2, Ven 41.4 (*)  pO2, Ven 178 (*)    Calcium, Ion 1.00 (*)    Hemoglobin 17.7 (*)    All other components within normal limits  TROPONIN I (HIGH SENSITIVITY) - Abnormal; Notable for the following components:   Troponin I (High Sensitivity) 37 (*)    All other components within normal limits  TROPONIN I (HIGH SENSITIVITY) - Abnormal; Notable for the following components:   Troponin I (High Sensitivity) 157 (*)    All other components within normal limits  SARS CORONAVIRUS 2 BY RT PCR  RESP PANEL BY RT-PCR (FLU A&B, COVID) ARPGX2  LIPASE, BLOOD  LACTIC ACID, PLASMA  URINALYSIS, ROUTINE W REFLEX MICROSCOPIC  LACTIC ACID, PLASMA  TROPONIN I (HIGH SENSITIVITY)  TROPONIN I (HIGH SENSITIVITY)    EKG EKG Interpretation  Date/Time:  Wednesday June 02 2022 07:56:59 EDT Ventricular Rate:  91 PR Interval:  162 QRS Duration: 102 QT Interval:  378 QTC Calculation: 464 R Axis:   -75 Text Interpretation: Normal sinus rhythm Possible Left atrial enlargement Left axis deviation Anteroseptal infarct , age undetermined Abnormal ECG When compared with ECG of 21-Feb-2022 22:10, PREVIOUS ECG IS PRESENT Confirmed by Campbell Stall ([id number]) on 1/63/8466 11:16:45 AM  Radiology CT Angio Chest/Abd/Pel for Dissection W and/or Wo Contrast  Result Date: 06/02/2022 CLINICAL DATA:  76 year old male with chest and abdominal pain, concern for acute aortic syndrome. EXAM: CT ANGIOGRAPHY CHEST, ABDOMEN AND PELVIS TECHNIQUE: Non-contrast CT of the chest was  initially obtained. Multidetector CT imaging through the chest, abdomen and pelvis was performed using the standard protocol during bolus administration of intravenous contrast. Multiplanar reconstructed images and MIPs were obtained and reviewed to evaluate the vascular anatomy. RADIATION DOSE REDUCTION: This exam was performed according to the departmental dose-optimization program which includes automated exposure control, adjustment of the mA and/or kV according to patient size and/or use of iterative reconstruction technique. CONTRAST:  185m OMNIPAQUE IOHEXOL 350 MG/ML SOLN COMPARISON:  Chest CT from 02/21/2022, chest abdomen pelvis from 11/09/2018 FINDINGS: CTA CHEST FINDINGS VASCULAR Preferential opacification of the thoracic aorta. No evidence of thoracic aortic aneurysm or dissection. Diffuse circumferential coronary atherosclerotic calcifications about the aortic arch and descending thoracic aorta. Normal heart size. No pericardial effusion. Sinues of Valsalva: 31 mm 32 x 31 mm ,unchanged Sinotubular Junction: 32 mm ,unchanged Ascending Aorta: 37 mm ,unchanged Aortic Arch: Fusiform aneurysmal dilation of zone 2 measuring up to 39 mm ,unchanged Descending aorta: 28 mm at the level of the carina ,unchanged Branch vessels: Conventional branching pattern. Ostial atherosclerotic changes without evidence of flow-limiting stenosis. Coronary arteries: Normal origins and courses. Severe atherosclerotic calcifications. Main pulmonary artery: 33 mm ,unchanged. No evidence of central pulmonary embolism. Pulmonary veins: No anomalous pulmonary venous return. No evidence of left atrial appendage thrombus. NON VASCULAR Mediastinum/Nodes: Interval enlargement of previously visualized prominent mediastinal lymph nodes, the largest in the aortopulmonic window measuring up to 1.7 cm in short axis, previously 1.1 cm. Lungs/Pleura: Severe upper lobe predominant centrilobular emphysema. Diffuse bronchial wall thickening.  Interval development trace, right greater than pleural effusions. Bibasilar subsegmental atelectasis. Musculoskeletal: No chest wall abnormality. No acute or significant osseous findings. Review of the MIP images confirms the above findings. CTA ABDOMEN AND PELVIS FINDINGS VASCULAR Aorta: Circumferential fibrofatty and calcific atherosclerotic about the suprarenal abdominal aorta which is normal in caliber and patent. 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 Celiac: Mild ostial stenosis, likely secondary to median arcuate ligament compression. Patent distally. SMA: Moderate proximal stenosis secondary to atherosclerotic plaque. Patent distally. Renals: Single bilateral renal  arteries with moderate ostial stenosis secondary to atherosclerotic plaque. IMA: Occluded. Inflow: Similar appearing total occlusion of the common and external iliac arteries which reconstitute distally via the inferior epigastric arteries. Internal iliac reconstitution via prominent arc of [place]. Veins: Duplicated IVC. Limited evaluation of abdominopelvic venous patency secondary to arterial phase image acquisition Review of the MIP images confirms the above findings. NON-VASCULAR Hepatobiliary: Unchanged 1.5 cm right lobe posterior simple hepatic cysts. The liver is otherwise normal in size, contour, and attenuation. The gallbladder is present with a single punctate partially calcified gallstone near the infundibulum, no surrounding inflammatory changes. No intra or extrahepatic biliary ductal dilation. Pancreas: Unremarkable. No pancreatic ductal dilatation or surrounding inflammatory changes. Spleen: Normal in size without focal abnormality. Adrenals/Urinary Tract: Adrenal glands are unremarkable. Unchanged appearance of multifocal bilateral simple appearing renal cysts. Kidneys are otherwise normal, without renal calculi, focal lesion, or hydronephrosis. Bladder is unremarkable. Stomach/Bowel: Stomach is within normal limits.  Appendix is not definitively identified. Redundant sigmoid colon. No evidence of bowel wall thickening, distention, or inflammatory changes. Lymphatic: No abdominopelvic lymphadenopathy. Reproductive: The prostate measures up to 6.5 cm in greatest axial dimension, previously 5.6 cm. Other: No abdominal wall hernia or abnormality. No abdominopelvic ascites. Musculoskeletal: No acute or significant osseous findings. IMPRESSION: VASCULAR 1. No evidence of acute aortic syndrome. 2. 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 3. 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 Recommend annual imaging followup by CTA or MRA. This recommendation follows 2010 ACCF/AHA/AATS/ACR/ASA/SCA/SCAI/SIR/STS/SVM Guidelines for the Diagnosis and Management of Patients with Thoracic Aortic Disease. Circulation.2010; 121: Q761-P509. Aortic aneurysm NOS (ICD10-I71.9) 4.  Coronary and aortic Atherosclerosis (ICD10-I70.0). NON-VASCULAR 1. Interval enlargement of mediastinal lymphadenopathy of indeterminate significance. 2. Trace right greater than left pleural effusions. 3. Similar appearing severe emphysema (ICD10-J43.9). 4. Prostatomegaly. Ruthann Cancer, MD [facility] Radiology Electronically Signed   By: Ruthann Cancer M.D.   On: 06/02/2022 13:48   DG Chest 2 View  Result Date: 06/02/2022 CLINICAL DATA:  Shortness of breath, chest pain. EXAM: CHEST - 2 VIEW COMPARISON:  Feb 21, 2022. FINDINGS: The heart size and mediastinal contours are within normal limits. Increased interstitial densities are noted in both lung bases consistent with pulmonary edema. Small bilateral pleural effusions are noted. The visualized skeletal structures are unremarkable. IMPRESSION: Increased bibasilar interstitial densities are noted most consistent with pulmonary edema. Small bilateral pleural effusions are  noted as well. Electronically Signed   By: Marijo Conception M.D.   On: 06/02/2022 08:13    Procedures Procedures  Continuous cardiac monitoring shows normal sinus rhythm.  Medications Ordered in ED Medications  ondansetron (ZOFRAN-ODT) disintegrating tablet 4 mg (has no administration in time range)  hydrochlorothiazide (HYDRODIURIL) tablet 25 mg (25 mg Oral Given 06/02/22 1207)  ondansetron (ZOFRAN-ODT) disintegrating tablet 4 mg (4 mg Oral Given 06/02/22 0959)  lisinopril (ZESTRIL) tablet 10 mg (10 mg Oral Given 06/02/22 1207)  acetaminophen (TYLENOL) tablet 650 mg (650 mg Oral Given 06/02/22 1207)  famotidine (PEPCID) tablet 20 mg (20 mg Oral Given 06/02/22 1207)  hydrALAZINE (APRESOLINE) injection 5 mg (5 mg Intravenous Given 06/02/22 1329)  iohexol (OMNIPAQUE) 350 MG/ML injection 100 mL (100 mLs Intravenous Contrast Given 06/02/22 1311)  morphine (PF) 4 MG/ML injection 4 mg (4 mg Intravenous Given 06/02/22 1407)    ED Course/ Medical Decision Making/ A&P Clinical Course as of 06/02/22 1604  Wed Jun 02, 2022  1233 Troponin I (  High Sensitivity)(!!): 157 Up from 37 on arrival at ED [MM]  1234 B Natriuretic Peptide(!): 1,245.2 [MM]  1331 Administered one-time dose of hydralazine. [MM]  1950 CT Angio Chest/Abd/Pel for Dissection W and/or Wo Contrast CT negative for aortic dissection [MM]  1500 Cardiology consulted, they will follow the patient as consultants. [MM]    Clinical Course User Index [MM] Nani Gasser, MD                           Medical Decision Making Risk Prescription drug management.   This patient is a 76 year old male with history of CAD and DVT previously on warfarin who presents with a month of epigastric pain and shortness of breath, acutely worsened this morning.  Hypoxic and tachypneic on exam.  Afebrile and hemodynamically stable.  Diffuse abdominal tenderness.  CBC with mild leukocytosis.  BMP with metabolic acidosis.  CXR with bilateral pulmonary  infiltrates and pleural effusion.  Leading suspicion is severe symptomatic hypertension with demand ischemia.  Have consulted cardiology to evaluate for ACS setting of chest pain with uptrending troponin.  Low index of suspicion for aortic dissection or PE given imaging findings.  AAA and thoracic aortic aneurysm stable.  Underlying pneumonia is still a possibility, however this patient is not septic.  Do not suspect pancreatitis or cholecystitis as a driver of this patient's current clinical syndrome.   Co morbidities that complicate the patient evaluation  CAD Chronic pain on prescription opioids Emphysema   Social Determinants of Health:  [place]   Additional history obtained:  Additional history and/or information obtained from chart review External records from outside source obtained and reviewed including charts from prior hospital encounters.  Echocardiogram from 02/22/2022.   Lab Tests:  I Ordered (or co-signed), and personally interpreted labs.  The pertinent results include:   Troponin 37-157 BNP 1245.2 WBC 11.2 COVID and flu negative Lipase within normal limits Alk phos and LFTs within normal limits   Imaging Studies ordered:  I ordered (or co-signed) imaging studies including CXR, CT angio chest abdomen pelvis for dissection I independently visualized and interpreted imaging which showed CXR shows bibasilar pulmonary edema with mild bilateral pleural effusions.  CT C/A/P negative for dissection, normal appearing gallbladder and pancreas. I agree with the radiologist interpretation   Cardiac Monitoring:  The patient was maintained on a cardiac monitor.  The cardiac monitored showed an rhythm of normal sinus rhythm. The patient was also maintained on pulse oximetry. The readings were typically within normal limits.  Patient was initially hypoxic to 90% on room air on arrival to the ED.   Medicines ordered and prescription drug  management:  I ordered medication including morphine 4 mg IV, hydralazine 5 mg IV, Tylenol 650 mg p.o., Pepcid 20 mg p.o., home BP meds, Zofran 4 mg ODT Reevaluation of the patient after these medicines showed that the patient improved   Consultations Obtained:  I requested consultation with cardiology,  and discussed lab and imaging findings as well as pertinent plan - they recommend: pending evaluation of the patient.   Reevaluation:  After the interventions noted above, I reevaluated the patient and found that they have :improved.   Dispostion:  After consideration of the diagnostic results and the patients response to treatment, I feel that the patent would benefit from admission to the hospital for work-up and treatment of shortness of breath and chest pain, with elevated troponin in the setting of systolic blood pressure  of 200.          Final Clinical Impression(s) / ED Diagnoses Final diagnoses:  Chest pain, unspecified type  Hypertension, unspecified type    Rx / DC Orders ED Discharge Orders     None         Nani Gasser, MD 25/00/37 0488    Gray, [place], DO 89/16/94 [phone]

## 2022-06-02 NOTE — Consult Note (Signed)
Cardiology Consultation:   Patient ID: Tony Zhang MRN: [id number]; DOB: 07-18-1946  Admit date: 06/02/2022 Date of Consult: 06/02/2022  PCP:  Tony [name], MD   [facility] Providers Cardiologist:  Tony Fair, MD        Patient Profile:   Tony Zhang is a 76 y.o. male with a hx of peripheral arterial disease who is being seen 06/02/2022 for the evaluation of chest pain and elevated troponin at the request of Tony Zhang.  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  He describes discomfort that goes from the lower abdomen up into the chest with deep breathing.  Symptoms have been present for 1 month but were worse this morning.  The patient states the pain comes and goes and only worsens with deep breathing.  He does not exert himself.  He is physically limited by back problems and PAD.  He is not able to do much walking at all.  The patient's initial lab data is pertinent for an elevated BNP of 1245 and an elevated high-sensitivity troponin initially at 157 and then increasing to 359.  Lactic acid is normal at 1.2.  Hemoglobin is elevated at 17.7.  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  The patient is alone at the time of my interview.  Other than symptoms outlined above with dyspnea and pleuritic chest discomfort, he has no  interval complaints.  He denies fever or chills.  He has a chronic cough that is unchanged.   Past Medical History:  Diagnosis Date   AAA (abdominal aortic aneurysm) (HCC)    Blood dyscrasia    followed by Tony Zhang, for clotting issue, has been on Coumadin for about 20 yrs.     COPD (chronic obstructive pulmonary disease) (HCC)    DJD (degenerative joint disease)    Dyslipidemia    Gout    Malignant hypertension    Peripheral vascular disease (HCC)    Pneumonia 08/2013   pt. reports that he is having this surgery 11/25/2014- for the lung problem that began with pneumonia in 09/2014   Shortness of breath    Venous thrombosis    Recurrent    Past Surgical History:  Procedure Laterality Date   APPENDECTOMY     BACK SURGERY     COLONOSCOPY N/A 04/13/2015   Procedure: COLONOSCOPY;  Surgeon: Tony Ching, MD;  Location: [facility] ENDOSCOPY;  Service: Endoscopy;  Laterality: N/A;   EMPYEMA DRAINAGE N/A 11/25/2014   Procedure: EMPYEMA DRAINAGE;  Surgeon: Tony Ovens, MD;  Location: [facility] OR;  Service: Thoracic;  Laterality: N/A;   ESOPHAGOGASTRODUODENOSCOPY N/A 04/10/2015   Procedure: ESOPHAGOGASTRODUODENOSCOPY (EGD);  Surgeon: Tony Rakes, MD;  Location: [facility] ENDOSCOPY;  Service: Endoscopy;  Laterality: N/A;   EYE SURGERY     both eyes, cataracts removed, denies lens implants   FLEXIBLE SIGMOIDOSCOPY N/A 04/12/2015   Procedure: FLEXIBLE SIGMOIDOSCOPY;  Surgeon: Tony Ching, MD;  Location: [facility] ENDOSCOPY;  Service: Endoscopy;  Laterality: N/A;  HERNIA REPAIR     SHOULDER SURGERY Right    VIDEO ASSISTED THORACOSCOPY Right 11/25/2014   Procedure: VIDEO ASSISTED THORACOSCOPY;  Surgeon: Tony Ovens, MD;  Location: [facility] OR;  Service: Thoracic;  Laterality: Right;   VIDEO BRONCHOSCOPY N/A 11/25/2014   Procedure: VIDEO BRONCHOSCOPY;  Surgeon: Tony Ovens, MD;  Location: [facility] OR;  Service: Thoracic;  Laterality: N/A;     Home Medications:  Prior to Admission medications   Medication  Sig Start Date End Date Taking? Authorizing Provider  albuterol (VENTOLIN HFA) 108 (90 Base) MCG/ACT inhaler Inhale 2 puffs into the lungs 5 (five) times daily.   Yes [provider]  allopurinol (ZYLOPRIM) 300 MG tablet Take 300 mg by mouth at bedtime.   Yes [provider]  cilostazol (PLETAL) 100 MG tablet Take 1 tablet (100 mg total) by mouth 2 (two) times daily. Please schedule appointment for refills. Patient taking differently: Take 100 mg by mouth 2 (two) times daily. 03/07/17  Yes Zhang, Mihai, MD  cyclobenzaprine (FLEXERIL) 10 MG tablet Take 10 mg by mouth 3 (three) times daily as needed for muscle spasms.   Yes [provider]  famotidine (PEPCID) 20 MG tablet Take 1 tablet (20 mg total) by mouth daily. 08/14/19  Yes Zhang, Tony Latif, DO  Fenofibrate 120 MG TABS Take 120 mg by mouth daily.    Yes [provider]  hydrochlorothiazide (HYDRODIURIL) 25 MG tablet Take 25 mg by mouth daily in the afternoon.   Yes [provider]  lisinopril (ZESTRIL) 10 MG tablet Take 10 mg by mouth daily in the afternoon.   Yes [provider]  metoprolol succinate (TOPROL-XL) 100 MG 24 hr tablet Take 100 mg by mouth daily.    Yes [provider]  naloxone (NARCAN) nasal spray 4 mg/0.1 mL Place 4 mg into the nose as needed. 05/26/22  Yes [provider]  omeprazole (PRILOSEC) 40 MG capsule Take 40 mg by mouth at bedtime.   Yes [provider]  ondansetron (ZOFRAN) 8 MG tablet Take 8 mg by mouth 2 (two) times daily as needed for vomiting or nausea. 05/25/22  Yes [provider]  oxyCODONE (ROXICODONE) 15 MG immediate release tablet Take 15 mg by mouth 4 (four) times daily as needed for pain. 05/26/22  Yes [provider]  tamsulosin (FLOMAX) 0.4 MG CAPS capsule Take 0.4 mg by mouth every evening.   Yes [provider]  warfarin (COUMADIN) 5 MG tablet Take 5 mg by mouth See admin instructions. Take 1 tablet  (5 mg) on Tuesday, Wednesday, Thursday, Saturday, Sunday. On Monday and Friday do not take. Or as directed by Coumadin clinic.   Yes [provider]  enoxaparin (LOVENOX) 100 MG/ML injection Inject 1 mL (100 mg total) into the skin every 12 (twelve) hours for 7 days. Until INR is > 2 Patient not taking: Reported on 06/02/2022 02/23/22 03/02/22  Carlyn Reichert, MD    Inpatient Medications: Scheduled Meds:  [START ON 06/03/2022] allopurinol  300 mg Oral QHS   cilostazol  100 mg Oral BID   [START ON 06/03/2022] famotidine  20 mg Oral Daily   [START ON 06/03/2022] fenofibrate  160 mg Oral Daily   furosemide  20 mg Intravenous Once   hydrochlorothiazide  25 mg Oral Daily   ipratropium-albuterol  3 mL Nebulization Q6H   [START ON 06/03/2022] metoprolol succinate  100 mg Oral Daily   [START ON 06/03/2022] pantoprazole  80 mg Oral Daily   sodium  chloride flush  3 mL Intravenous Q12H   tamsulosin  0.4 mg Oral QPM   Continuous Infusions:  sodium chloride     PRN Meds: sodium chloride, acetaminophen **OR** acetaminophen, albuterol, cyclobenzaprine, hydrALAZINE, morphine injection, ondansetron **OR** ondansetron (ZOFRAN) IV, oxyCODONE, sodium chloride flush  Allergies:    Allergies  Allergen Reactions   Codeine Hives and Other (See Comments)    Takes benadryl to stop allergic reactions    Social History:   Social History   Socioeconomic History   Marital status: Divorced    Spouse name: Not on file   Number of children: Not on file   Years of education: Not on file   Highest education level: Not on file  Occupational History   Not on file  Tobacco Use   Smoking status: Former    Types: Cigarettes    Quit date: 10/18/1992    Years since quitting: 29.6   Smokeless tobacco: Never  Vaping Use   Vaping Use: Never used  Substance and Sexual Activity   Alcohol use: No   Drug use: No   Sexual activity: Not on file  Other Topics Concern   Not on file  Social History Narrative    Not on file   Social Determinants of Health   Financial Resource Strain: Not on file  Food Insecurity: Not on file  Transportation Needs: Not on file  Physical Activity: Not on file  Stress: Not on file  Social Connections: Not on file  Intimate Partner Violence: Not on file    Family History:   Family History  Problem Relation Age of Onset   CAD Father 57   Liver cancer Brother    CAD Sister      ROS:  Please see the history of present illness.  All other ROS reviewed and negative.     Physical Exam/Data:   Vitals:   06/02/22 1445 06/02/22 1500 06/02/22 1530 06/02/22 1537  BP: (!) 166/132 (!) 176/67 (!) 188/107   Pulse: 77 78 78   Resp: 18 (!) 25 15   Temp:    98.3 F (36.8 C)  TempSrc:      SpO2: 91% 93% 93%     Intake/Output Summary (Last 24 hours) at 06/02/2022 1707 Last data filed at 06/02/2022 1541 Gross per 24 hour  Intake --  Output 600 ml  Net -600 ml      02/21/2022    7:26 PM 09/13/2020    6:47 AM 07/18/2020    8:49 AM  Last 3 Weights  Weight (lbs) 206 lb 231 lb 230 lb  Weight (kg) 93.441 kg 104.781 kg 104.327 kg     There is no height or weight on file to calculate BMI.  General: Chronically ill-appearing male, in no acute distress HEENT: normal Neck: no JVD Vascular: No carotid bruits; Distal pulses 2+ bilaterally Cardiac:  normal S1, S2; RRR; 2/6 systolic murmur at the right upper sternal border, distant heart sounds Lungs: Poor air movement bilaterally Abd: soft, nontender, no hepatomegaly  Ext: no edema Musculoskeletal:  No deformities, BUE and BLE strength normal and equal Skin: warm and dry  Neuro:  CNs 2-12 intact, no focal abnormalities noted Psych:  Normal affect   EKG:  The EKG was personally reviewed and demonstrates: Normal sinus rhythm 91 bpm, age-indeterminate anteroseptal infarct, age-indeterminate inferior infarct  Telemetry:  Telemetry was personally reviewed and demonstrates: Normal sinus rhythm without significant  arrhythmia  Relevant CV Studies: 2D echocardiogram 02/22/2022: 1. Left ventricular ejection fraction,  by estimation, is >75%. The left  ventricle has hyperdynamic function. The left ventricle has no regional  wall motion abnormalities. There is mild left ventricular hypertrophy.  Left ventricular diastolic parameters  are consistent with Grade I diastolic dysfunction (impaired relaxation).   2. Right ventricular systolic function is normal. The right ventricular  size is normal.   3. The mitral valve is normal in structure. No evidence of mitral valve  regurgitation. No evidence of mitral stenosis.   4. The aortic valve has an indeterminant number of cusps. There is mild  calcification of the aortic valve. There is mild thickening of the aortic  valve. Aortic valve regurgitation is not visualized. Mild aortic valve  stenosis. Aortic valve mean gradient   measures 16.0 mmHg. Aortic valve Vmax measures 2.68 m/s.   5. The inferior vena cava is normal in size with greater than 50%  respiratory variability, suggesting right atrial pressure of 3 mmHg.   Comparison(s): Prior mean aortic valve gradient 19 mmHg. Stable.  Laboratory Data:  High Sensitivity Troponin:   Recent Labs  Lab 06/02/22 0752 06/02/22 0953 06/02/22 1530  TROPONINIHS 37* 157* 359*     Chemistry Recent Labs  Lab 06/02/22 0752 06/02/22 1215 06/02/22 1235  NA 135 136 135  K 4.2 4.1 5.1  CL 106 105  --   CO2 17* 17*  --   GLUCOSE 149* 151*  --   BUN 13 13  --   CREATININE 0.91 0.96  --   CALCIUM 8.8* 8.9  --   GFRNONAA >60 >60  --   ANIONGAP 12 14  --     Recent Labs  Lab 06/02/22 1215  PROT 7.4  ALBUMIN 3.6  AST 16  ALT 10  ALKPHOS 88  BILITOT 1.6*   Lipids No results for input(s): "CHOL", "TRIG", "HDL", "LABVLDL", "LDLCALC", "CHOLHDL" in the last 168 hours.  Hematology Recent Labs  Lab 06/02/22 0752 06/02/22 1235  WBC 11.2*  --   RBC 4.97  --   HGB 15.9 17.7*  HCT 48.8 52.0  MCV 98.2  --    MCH 32.0  --   MCHC 32.6  --   RDW 15.5  --   PLT 259  --    Thyroid No results for input(s): "TSH", "FREET4" in the last 168 hours.  BNP Recent Labs  Lab 06/02/22 0953  BNP 1,245.2*    DDimer No results for input(s): "DDIMER" in the last 168 hours.   Radiology/Studies:  CT Angio Chest/Abd/Pel for Dissection W and/or Wo Contrast  Result Date: 06/02/2022 CLINICAL DATA:  76 year old male with chest and abdominal pain, concern for acute aortic syndrome. EXAM: CT ANGIOGRAPHY CHEST, ABDOMEN AND PELVIS TECHNIQUE: Non-contrast CT of the chest was initially obtained. Multidetector CT imaging through the chest, abdomen and pelvis was performed using the standard protocol during bolus administration of intravenous contrast. Multiplanar reconstructed images and MIPs were obtained and reviewed to evaluate the vascular anatomy. RADIATION DOSE REDUCTION: This exam was performed according to the departmental dose-optimization program which includes automated exposure control, adjustment of the mA and/or kV according to patient size and/or use of iterative reconstruction technique. CONTRAST:  OMNIPAQUE IOHEXOL 350 MG/ML SOLN COMPARISON:  Chest CT from 02/21/2022, chest abdomen pelvis from 11/09/2018 FINDINGS: CTA CHEST FINDINGS VASCULAR Preferential opacification of the thoracic aorta. No evidence of thoracic aortic aneurysm or dissection. Diffuse circumferential coronary atherosclerotic calcifications about the aortic arch and descending thoracic aorta. Normal heart size. No pericardial effusion. Sinues of Valsalva: 31  mm 32 x 31 mm ,unchanged Sinotubular Junction: 32 mm ,unchanged Ascending Aorta: 37 mm ,unchanged Aortic Arch: Fusiform aneurysmal dilation of zone 2 measuring up to 39 mm ,unchanged Descending aorta: 28 mm at the level of the carina ,unchanged Branch vessels: Conventional branching pattern. Ostial atherosclerotic changes without evidence of flow-limiting stenosis. Coronary arteries: Normal  origins and courses. Severe atherosclerotic calcifications. Main pulmonary artery: 33 mm ,unchanged. No evidence of central pulmonary embolism. Pulmonary veins: No anomalous pulmonary venous return. No evidence of left atrial appendage thrombus. NON VASCULAR Mediastinum/Nodes: Interval enlargement of previously visualized prominent mediastinal lymph nodes, the largest in the aortopulmonic window measuring up to 1.7 cm in short axis, previously 1.1 cm. Lungs/Pleura: Severe upper lobe predominant centrilobular emphysema. Diffuse bronchial wall thickening. Interval development trace, right greater than pleural effusions. Bibasilar subsegmental atelectasis. Musculoskeletal: No chest wall abnormality. No acute or significant osseous findings. Review of the MIP images confirms the above findings. CTA ABDOMEN AND PELVIS FINDINGS VASCULAR Aorta: Circumferential fibrofatty and calcific atherosclerotic about the suprarenal abdominal aorta which is normal in caliber and patent. 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 Celiac: Mild ostial stenosis, likely secondary to median arcuate ligament compression. Patent distally. SMA: Moderate proximal stenosis secondary to atherosclerotic plaque. Patent distally. Renals: Single bilateral renal arteries with moderate ostial stenosis secondary to atherosclerotic plaque. IMA: Occluded. Inflow: Similar appearing total occlusion of the common and external iliac arteries which reconstitute distally via the inferior epigastric arteries. Internal iliac reconstitution via prominent arc of [place]. Veins: Duplicated IVC. Limited evaluation of abdominopelvic venous patency secondary to arterial phase image acquisition Review of the MIP images confirms the above findings. NON-VASCULAR Hepatobiliary: Unchanged 1.5 cm right lobe posterior simple hepatic cysts. The liver is otherwise normal in size, contour, and attenuation. The gallbladder is present with a single punctate partially  calcified gallstone near the infundibulum, no surrounding inflammatory changes. No intra or extrahepatic biliary ductal dilation. Pancreas: Unremarkable. No pancreatic ductal dilatation or surrounding inflammatory changes. Spleen: Normal in size without focal abnormality. Adrenals/Urinary Tract: Adrenal glands are unremarkable. Unchanged appearance of multifocal bilateral simple appearing renal cysts. Kidneys are otherwise normal, without renal calculi, focal lesion, or hydronephrosis. Bladder is unremarkable. Stomach/Bowel: Stomach is within normal limits. Appendix is not definitively identified. Redundant sigmoid colon. No evidence of bowel wall thickening, distention, or inflammatory changes. Lymphatic: No abdominopelvic lymphadenopathy. Reproductive: The prostate measures up to 6.5 cm in greatest axial dimension, previously 5.6 cm. Other: No abdominal wall hernia or abnormality. No abdominopelvic ascites. Musculoskeletal: No acute or significant osseous findings. IMPRESSION: VASCULAR 1. No evidence of acute aortic syndrome. 2. 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 3. 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 Recommend annual imaging followup by CTA or MRA. This recommendation follows 2010 ACCF/AHA/AATS/ACR/ASA/SCA/SCAI/SIR/STS/SVM Guidelines for the Diagnosis and Management of Patients with Thoracic Aortic Disease. Circulation.2010; [id number]:[id number]. Aortic aneurysm NOS (ICD10-I71.9) 4.  Coronary and aortic Atherosclerosis (ICD10-I70.0). NON-VASCULAR 1. Interval enlargement of mediastinal lymphadenopathy of indeterminate significance. 2. Trace right greater than left pleural effusions. 3. Similar appearing severe emphysema (ICD10-J43.9). 4. Prostatomegaly. Ruthann Cancer, MD [facility] Electronically Signed   By: Ruthann Cancer M.D.   On:  06/02/2022 13:48   DG Chest 2 View  Result Date: 06/02/2022 CLINICAL DATA:  Shortness of breath, chest pain. EXAM: CHEST - 2 VIEW COMPARISON:  Feb 21, 2022. FINDINGS: The heart size and mediastinal contours are within normal  limits. Increased interstitial densities are noted in both lung bases consistent with pulmonary edema. Small bilateral pleural effusions are noted. The visualized skeletal structures are unremarkable. IMPRESSION: Increased bibasilar interstitial densities are noted most consistent with pulmonary edema. Small bilateral pleural effusions are noted as well. Electronically Signed   By: Marijo Conception M.D.   On: 06/02/2022 08:13     Assessment and Plan:   Chest pain, atypical, rule out ACS with elevated troponin.  His troponin shows a slight upward trend and I would recommend repeating a troponin level in the morning.  I think it is appropriate to anticoagulate him with unfractionated heparin since his INR is subtherapeutic.  His symptoms are not typical of ACS and he is a poor candidate for invasive evaluation considering his comorbid conditions and severe peripheral arterial disease with an occluded aorta.  I am inclined to treat him medically, updated 2D echocardiogram, and trend his troponin as outlined. Acute diastolic heart failure: Agree with gentle IV diuresis with furosemide.  The patient has severe COPD and is at risk of progressive respiratory failure with even a mild heart failure exacerbation.  We will follow with you.  LVEF was vigorous and estimated greater than 75% on prior echo.  We will repeat his echo considering his new symptoms. Hypertension, uncontrolled. Symptoms could be related to hypertensive urgency as well. Orders written for Toprol, HCTZ, IV lasix, and prn hydralazine.   OTHER PROBLEMS AS PER HOSPITALIST TEAM. WILL FOLLOW WITH YOU   Risk Assessment/Risk Scores:        New York Heart Association (NYHA) Functional Class NYHA Class IV        For  questions or updates, please contact [facility] Please consult [email or web address] for contact info under    Signed, Sherren Mocha, MD  06/02/2022 5:07 PM

## 2022-06-02 NOTE — Assessment & Plan Note (Addendum)
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 Continue annual f/u  Suprarenal abdominal aortic aneurysm, measuring up to 3.5 cm (from CT abdomen in 5/23) continue regular f/u

## 2022-06-02 NOTE — ED Notes (Signed)
Patient transported to X-ray 

## 2022-06-02 NOTE — ED Notes (Signed)
Pt transported to CT ?

## 2022-06-02 NOTE — Assessment & Plan Note (Addendum)
INR 1.2, subtherapeutic>heparing gtt with chest pain/elevated troponin   warfarin per pharmacy dosing

## 2022-06-02 NOTE — Assessment & Plan Note (Signed)
Continue pletal, fenofibrate, coumadin Unsure why not on statin,  He can not tell me

## 2022-06-03 ENCOUNTER — Other Ambulatory Visit (HOSPITAL_COMMUNITY): Payer: Self-pay

## 2022-06-03 ENCOUNTER — Inpatient Hospital Stay (HOSPITAL_COMMUNITY): Payer: Medicare Other

## 2022-06-03 DIAGNOSIS — R079 Chest pain, unspecified: Secondary | ICD-10-CM

## 2022-06-03 DIAGNOSIS — J9601 Acute respiratory failure with hypoxia: Secondary | ICD-10-CM | POA: Diagnosis not present

## 2022-06-03 LAB — LIPID PANEL
Cholesterol: 140 mg/dL (ref 0–200)
HDL: 36 mg/dL — ABNORMAL LOW (ref >40)
LDL Cholesterol: 85 mg/dL (ref 0–99)
Total CHOL/HDL Ratio: 3.9 RATIO
Triglycerides: 97 mg/dL (ref <150)
VLDL: 19 mg/dL (ref 0–40)

## 2022-06-03 LAB — ECHOCARDIOGRAM LIMITED
AR max vel: 0.47 cm2
AV Area VTI: 0.48 cm2
AV Area mean vel: 0.46 cm2
AV Mean grad: 36 mmHg
AV Peak grad: 51 mmHg
Ao pk vel: 3.57 m/s
Area-P 1/2: 3.4 cm2
Calc EF: 72.3 %
Single Plane A2C EF: 71 %
Single Plane A4C EF: 72.4 %

## 2022-06-03 LAB — CBC
HCT: 46.4 % (ref 39.0–52.0)
Hemoglobin: 15.5 g/dL (ref 13.0–17.0)
MCH: 32.2 pg (ref 26.0–34.0)
MCHC: 33.4 g/dL (ref 30.0–36.0)
MCV: 96.5 fL (ref 80.0–100.0)
Platelets: 255 10*3/uL (ref 150–400)
RBC: 4.81 MIL/uL (ref 4.22–5.81)
RDW: 15.1 % (ref 11.5–15.5)
WBC: 9.3 10*3/uL (ref 4.0–10.5)
nRBC: 0 % (ref 0.0–0.2)

## 2022-06-03 LAB — BASIC METABOLIC PANEL
Anion gap: 10 (ref 5–15)
BUN: 20 mg/dL (ref 8–23)
CO2: 20 mmol/L — ABNORMAL LOW (ref 22–32)
Calcium: 8.7 mg/dL — ABNORMAL LOW (ref 8.9–10.3)
Chloride: 103 mmol/L (ref 98–111)
Creatinine, Ser: 1.22 mg/dL (ref 0.61–1.24)
GFR, Estimated: >60 mL/min (ref >60)
Glucose, Bld: 149 mg/dL — ABNORMAL HIGH (ref 70–99)
Potassium: 4.1 mmol/L (ref 3.5–5.1)
Sodium: 133 mmol/L — ABNORMAL LOW (ref 135–145)

## 2022-06-03 LAB — HEMOGLOBIN A1C
Hgb A1c MFr Bld: 5.1 % (ref 4.8–5.6)
Mean Plasma Glucose: 99.67 mg/dL

## 2022-06-03 LAB — TROPONIN I (HIGH SENSITIVITY): Troponin I (High Sensitivity): 307 ng/L (ref <18)

## 2022-06-03 LAB — HEPARIN LEVEL (UNFRACTIONATED)
Heparin Unfractionated: 0.2 IU/mL — ABNORMAL LOW (ref 0.30–0.70)
Heparin Unfractionated: 0.29 IU/mL — ABNORMAL LOW (ref 0.30–0.70)
Heparin Unfractionated: 0.4 IU/mL (ref 0.30–0.70)

## 2022-06-03 LAB — PROTIME-INR
INR: 1.3 — ABNORMAL HIGH (ref 0.8–1.2)
Prothrombin Time: 15.8 seconds — ABNORMAL HIGH (ref 11.4–15.2)

## 2022-06-03 MED ORDER — HEPARIN BOLUS VIA INFUSION
1500.0000 [IU] | Freq: Once | INTRAVENOUS | Status: AC
  Administered 2022-06-03: 1500 [IU] via INTRAVENOUS
  Filled 2022-06-03: qty 1500

## 2022-06-03 MED ORDER — WARFARIN SODIUM 7.5 MG PO TABS
7.5000 mg | ORAL_TABLET | Freq: Once | ORAL | Status: AC
  Administered 2022-06-03: 7.5 mg via ORAL
  Filled 2022-06-03 (×2): qty 1

## 2022-06-03 MED ORDER — PERFLUTREN LIPID MICROSPHERE
1.0000 mL | INTRAVENOUS | Status: AC | PRN
  Administered 2022-06-03: 4 mL via INTRAVENOUS

## 2022-06-03 MED ORDER — SPIRONOLACTONE 25 MG PO TABS
25.0000 mg | ORAL_TABLET | Freq: Every day | ORAL | Status: DC
  Administered 2022-06-03 – 2022-06-06 (×4): 25 mg via ORAL
  Filled 2022-06-03 (×4): qty 1

## 2022-06-03 MED ORDER — WARFARIN - PHARMACIST DOSING INPATIENT: Freq: Every day | Status: DC

## 2022-06-03 MED ORDER — EMPAGLIFLOZIN 10 MG PO TABS
10.0000 mg | ORAL_TABLET | Freq: Every day | ORAL | Status: DC
  Administered 2022-06-03 – 2022-06-06 (×4): 10 mg via ORAL
  Filled 2022-06-03 (×5): qty 1

## 2022-06-03 MED ORDER — ATORVASTATIN CALCIUM 40 MG PO TABS
40.0000 mg | ORAL_TABLET | Freq: Every day | ORAL | Status: DC
  Administered 2022-06-03 – 2022-06-06 (×4): 40 mg via ORAL
  Filled 2022-06-03 (×4): qty 1

## 2022-06-03 NOTE — ED Notes (Signed)
Echo at [facility]. Pt c/o pain, alert, NAD, calm, interactive, resps e/u, speaking clearly, VSS.

## 2022-06-03 NOTE — Progress Notes (Signed)
ANTICOAGULATION CONSULT NOTE  Pharmacy Consult for warfarin > heparin Indication: Hx DVT/PE   Allergies  Allergen Reactions   Codeine Hives and Other (See Comments)    Takes benadryl to stop allergic reactions    Patient Measurements:    Vital Signs: Temp: 97.8 F (36.6 C) (08/17 0142) Temp Source: Oral (08/16 2203) BP: 176/83 (08/17 0300) Pulse Rate: 78 (08/17 0300)  Labs: Recent Labs    06/02/22 0752 06/02/22 0953 06/02/22 1215 06/02/22 1235 06/02/22 1530 06/03/22 0257  HGB 15.9  --   --  17.7*  --  15.5  HCT 48.8  --   --  52.0  --  46.4  PLT 259  --   --   --   --  255  LABPROT  --   --  15.4*  --   --  15.8*  INR  --   --  1.2  --   --  1.3*  HEPARINUNFRC  --   --   --   --   --  0.20*  CREATININE 0.91  --  0.96  --   --   --   TROPONINIHS 37* 157*  --   --  359*  --      CrCl cannot be calculated (Unknown ideal weight.).   Medical History: Past Medical History:  Diagnosis Date   AAA (abdominal aortic aneurysm) (HCC)    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     COPD (chronic obstructive pulmonary disease) (HCC)    DJD (degenerative joint disease)    Dyslipidemia    Gout    Malignant hypertension    Peripheral vascular disease (HCC)    Pneumonia 08/2013   pt. reports that he is having this surgery 11/25/2014- for the lung problem that began with pneumonia in 09/2014   Shortness of breath    Venous thrombosis    Recurrent    Assessment: 32 YOM presenting with SOB, hx DVT on warfarin PTA, last dose 8/15 and INR on admission is 1.2. PTA dosing: 5mg  daily except Mon and Fri no doses  Heparin level subtherapeutic (0.2) on infusion at 1600 units/hr. No issues with line or bleeding reported per RN.  Goal of Therapy:  Heparin level 0.3-0.7 units/ml Monitor platelets by anticoagulation protocol: Yes   Plan:  Rebolus heparin 1500 units Increase heparin infusion to 1850 units/hr Will  f/u 6 hr heparin level  Wed, PharmD, BCPS Please see amion for complete clinical pharmacist phone list 06/03/2022 3:31 AM

## 2022-06-03 NOTE — Progress Notes (Signed)
Heart Failure Stewardship Pharmacist Progress Note   PCP: [name], MD PCP-Cardiologist: Thurmon Fair, MD    HPI:  76 yo M with PMH of COPD, DVT/PE, CHF, AAA, bilateral renal artery stenosis, and PVD.   He presented to the ED on 8/16 with shortness of breath, chest pain, and chronic edema. CXR with increased bibasilar edema. CTA negative for PE or dissection. An ECHO was done on 8/17 with LVEF 50-55%, regional wall motion abnormalities, G1DD, and moderate-severe aortic stenosis.   Current HF Medications: Beta Blocker: metoprolol XL 100 mg daily ACE/ARB/ARNI: lisinopril 10 mg daily MRA: spironolactone 25 mg daily SGLT2i: Jardiance 10 mg daily  Prior to admission HF Medications: Beta blocker: metoprolol XL 100 mg daily ACE/ARB/ARNI: lisinopril 10 mg daily  Pertinent Lab Values: Serum creatinine 1.22, BUN 20, Potassium 4.1, Sodium 133, BNP 1245.2, A1c 5.6  Vital Signs: Weight: 206 lbs (admission weight: 206 lbs) Blood pressure: 150-170/70s  Heart rate: 60-70s  I/O: - yesterday; net -1L  Medication Assistance / Insurance Benefits Check: Does the patient have prescription insurance?  Yes Type of insurance plan: [facility] Medicare  Outpatient Pharmacy:  Prior to admission outpatient pharmacy: [facility] Is the patient willing to use [facility] TOC pharmacy at discharge? Yes Is the patient willing to transition their outpatient pharmacy to utilize a [facility] outpatient pharmacy?   Pending    Assessment: 1. Acute on chronic diastolic CHF (LVEF 50-55%). NYHA class III symptoms. - No IV lasix ordered - diuresis per cardiology - Continue metoprolol XL 100 mg daily - On lisinopril 10 mg daily, consider optimizing to Entresto 49/51 mg BID with EF <55%. Can give losartan x 1 day prior to starting Entresto for continued RAAS therapy OR can stop lisinopril and start Entresto in 36 hours. - Agree with starting spironolactone 25 mg daily - Agree with starting Jardiance 10 mg  daily   Plan: 1) Medication changes recommended at this time: - Stop lisinopril - Start losartan 50 mg daily (equivalent dose) tomorrow x 1 day, then Entresto 49/51 mg BID starting on 8/19  2) Patient assistance: - Sherryll Burger copay $10.35 - Jardiance/Farxiga copay $10.35  3)  Education  - To be completed prior to discharge  Sharen Hones, PharmD, BCPS Heart Failure Stewardship Pharmacist Phone [phone]

## 2022-06-03 NOTE — Progress Notes (Signed)
ANTICOAGULATION CONSULT NOTE- follow-up  Pharmacy Consult for warfarin > heparin Indication: Hx DVT/PE   Allergies  Allergen Reactions   Codeine Hives and Other (See Comments)    Takes benadryl to stop allergic reactions    Patient Measurements: Height: 6\' 1"  (185.4 cm) Weight: 93.4 kg (206 lb) IBW/kg (Calculated) : 79.9  Vital Signs: Temp: 98.4 F (36.9 C) (08/17 1959) Temp Source: Oral (08/17 1554) BP: 121/58 (08/17 1959) Pulse Rate: 67 (08/17 1959)  Labs: Recent Labs    06/02/22 0752 06/02/22 0953 06/02/22 1215 06/02/22 1235 06/02/22 1530 06/03/22 0257 06/03/22 0544 06/03/22 1054 06/03/22 2008  HGB 15.9  --   --  17.7*  --  15.5  --   --   --   HCT 48.8  --   --  52.0  --  46.4  --   --   --   PLT 259  --   --   --   --  255  --   --   --   LABPROT  --   --  15.4*  --   --  15.8*  --   --   --   INR  --   --  1.2  --   --  1.3*  --   --   --   HEPARINUNFRC  --   --   --   --   --  0.20*  --  0.29* 0.40  CREATININE 0.91  --  0.96  --   --  1.22  --   --   --   TROPONINIHS 37* 157*  --   --  359*  --  307*  --   --      Estimated Creatinine Clearance: 58.2 mL/min (by C-G formula based on SCr of 1.22 mg/dL).  Assessment: 36 YOM presenting with SOB, hx DVT on warfarin PTA, last dose 8/15 and INR on admission is 1.2. PTA dosing: 5mg  daily except Mon and Fri no doses  06/03/2022 PM  Heparin level therapeutic at 0.40 no bleeding noted No issues with the infusion  Goal of Therapy:  Heparin level 0.3-0.7 units/ml Monitor platelets by anticoagulation protocol: Yes   Plan:  Continue heparin gtt at 2000 units/hr Daily heparin level, INR and CBC   Dianah Pruett BS, PharmD, BCPS Clinical Pharmacist 06/03/2022 8:47 PM  Contact: [phone] after 3 PM  "Be curious, not judgmental..." -06/05/2022

## 2022-06-03 NOTE — Progress Notes (Addendum)
PROGRESS NOTE    Tony Zhang  NID:[id number] DOB: 12-15-1945 DOA: 06/02/2022 PCP: [name], MD  76/M with history of COPD, former smoker, history of DVT/PE, chronic diastolic CHF, bilateral renal artery stenosis, chronic pain presented to the ED with intermittent chest pain and dyspnea X 1 week he describes the chest pain as pleuritic, in addition has been having worsening dyspnea on exertion for 1 month, has some edema chronically, uses compression stockings. -In the ED BP elevated, troponin 37> 150> 359, chest x-ray with pulmonary edema and small bilateral effusions, CTA chest with chronic infrarenal abdominal aortic occlusion, emphysema, mediastinal adenopathy, prostatomegaly -Admitted, started on diuretics  Subjective: Breathing improving, chest pain/discomfort is less severe today  Assessment and Plan:  Acute respiratory failure with hypoxia (HCC) Acute on chronic diastolic CHF -In the background of known COPD, former smoker, CT evidence of emphysema -Continue IV Lasix today -Follow-up repeat echo -echo 5/23: EF >75%. Grade 1 DD. Mild AS.  -Cards following -DC HCTZ will start Aldactone and Jardiance  Hypertensive urgency with elevated troponins and chest pain  -CTA chest/abdo/pelvis negative for aortic dissection -troponin elevated from [phone]. nstemi vs. 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 Continue annual f/u  Suprarenal abdominal aortic aneurysm, measuring up to 3.5 cm  (from CT abdomen in 5/23) continue regular f/u  -Continue Coumadin, start statin, LDL is 85  DVT, lower extremity and pulmonary embolism, recurrent INR 1.2, subtherapeutic>heparing gtt with chest pain/elevated troponin   warfarin per pharmacy dosing   COPD (chronic obstructive pulmonary disease) (HCC) -Stable, no exacerbation at this time, continue as needed nebs  Peripheral vascular disease (HCC) Continue pletal, fenofibrate, coumadin  Hyperlipidemia Continue fenofibrate -Add Lipitor  Gout Continue allopurinol   DVT prophylaxis: IV heparin, Coumadin Code Status: Full code Family Communication: Discussed with patient in detail, no family at bedside Disposition Plan: Home pending cardiac work-up  Consultants:  Cardiology  Procedures:   Antimicrobials:    Objective: Vitals:   06/03/22 0830 06/03/22 0845 06/03/22 0900 06/03/22 0915  BP: (!) 167/68 (!) 158/59 (!) 150/56 (!) 171/71  Pulse: 73 78 70 79  Resp: 18 18 17  (!) 24  Temp:      TempSrc:      SpO2: 97% 96% 97% 99%    Intake/Output Summary (Last 24 hours) at 06/03/2022 1025 Last data filed at 06/03/2022 0800 Gross per 24 hour  Intake 250 ml  Output 1000 ml  Net -750 ml   There were no vitals filed for this visit.  Examination:  Chronically ill male appears much older than stated age, AAO x3 HEENT: Positive JVD CVS: S1-S2, regular rhythm Lungs: Few basilar rales, poor air movement bilaterally Abdomen: Soft, nontender bowel sounds present Extremities: Trace edema  Skin: No rashes on exposed skin Psychiatry:  Mood & affect appropriate.     Data Reviewed:   CBC: Recent Labs  Lab 06/02/22 0752 06/02/22 1235 06/03/22 0257  WBC 11.2*  --  9.3  NEUTROABS 9.6*  --   --   HGB 15.9 17.7* 15.5  HCT 48.8 52.0 46.4  MCV 98.2  --  96.5  PLT 259  --  255   Basic Metabolic Panel: Recent Labs  Lab 06/02/22 0752 06/02/22 1215 06/02/22 1235 06/03/22 0257  NA 135 136 135 133*  K 4.2 4.1 5.1 4.1  CL  106 105  --  103  CO2 17* 17*  --  20*  GLUCOSE 149* 151*  --  149*  BUN 13 13  --  20  CREATININE 0.91 0.96  --  1.22  CALCIUM 8.8* 8.9  --  8.7*   GFR: CrCl cannot be calculated (Unknown ideal weight.). Liver Function Tests: Recent Labs  Lab 06/02/22 1215  AST 16  ALT 10  ALKPHOS 88  BILITOT 1.6*  PROT 7.4  ALBUMIN 3.6   Recent Labs  Lab 06/02/22 0752  LIPASE 25   No results for input(s): "AMMONIA" in the last 168 hours. Coagulation Profile: Recent Labs  Lab 06/02/22 1215 06/03/22 0257  INR 1.2 1.3*   Cardiac Enzymes: No results for input(s): "CKTOTAL", "CKMB", "CKMBINDEX", "TROPONINI" in the last 168 hours. BNP (last 3 results) No results for input(s): "PROBNP" in the last 8760 hours. HbA1C: No results for input(s): "HGBA1C" in the last 72 hours. CBG: No results for input(s): "GLUCAP" in the last 168 hours. Lipid Profile: Recent Labs    06/03/22 0257  CHOL 140  HDL 36*  LDLCALC 85  TRIG 97  CHOLHDL 3.9   Thyroid Function Tests: Recent Labs    06/02/22 2133  TSH 0.818   Anemia Panel: No results for input(s): "VITAMINB12", "FOLATE", "FERRITIN", "TIBC", "IRON", "RETICCTPCT" in the last 72 hours. Urine analysis:    Component Value Date/Time   COLORURINE YELLOW 02/21/2022 1357   APPEARANCEUR CLEAR 02/21/2022 1357   LABSPEC 1.015 02/21/2022 1357   PHURINE 5.0 02/21/2022 1357   GLUCOSEU NEGATIVE 02/21/2022 1357   HGBUR NEGATIVE 02/21/2022 1357   BILIRUBINUR NEGATIVE 02/21/2022 1357   KETONESUR NEGATIVE 02/21/2022 1357   PROTEINUR NEGATIVE 02/21/2022 1357   UROBILINOGEN 0.2 04/08/2015 2033   NITRITE NEGATIVE 02/21/2022 1357   LEUKOCYTESUR NEGATIVE 02/21/2022 1357   Sepsis Labs: @LABRCNTIP (procalcitonin:4,lacticidven:4)  ) Recent Results (from the past 240 hour(s))  SARS Coronavirus 2 by RT PCR (hospital order, performed in [facility] hospital lab) *cepheid single result test* Anterior Nasal Swab     Status: None   Collection Time: 06/02/22   7:42 AM   Specimen: Anterior Nasal Swab  Result Value Ref Range Status   SARS Coronavirus 2 by RT PCR NEGATIVE NEGATIVE Final    Comment: (NOTE) SARS-CoV-2 target nucleic acids are NOT DETECTED.  The SARS-CoV-2 RNA is generally detectable in upper and lower respiratory specimens during the acute phase of infection. The lowest concentration of SARS-CoV-2 viral copies this assay can detect is 250 copies / mL. A negative result does not preclude SARS-CoV-2 infection and should not be used as the sole basis for treatment or other patient management decisions.  A negative result may occur with improper specimen collection / handling, submission of specimen other than nasopharyngeal swab, presence of viral mutation(s) within the areas targeted by this assay, and inadequate number of viral copies (<250 copies / mL). A negative result must be combined with clinical observations, patient history, and epidemiological information.  Fact Sheet for Patients:   06/04/22  Fact Sheet for Healthcare Providers: RoadLapTop.co.za  This test is not yet approved or  cleared by the http://kim-miller.com/ FDA and has been authorized for detection and/or diagnosis of SARS-CoV-2 by FDA under an Emergency Use Authorization (EUA).  This EUA will  remain in effect (meaning this test can be used) for the duration of the COVID-19 declaration under Section 564(b)(1) of the Act, 21 U.S.C. section 360bbb-3(b)(1), unless the authorization is terminated or revoked sooner.  Performed at [facility], [place] [address], [place], [place] [address]   Resp Panel by RT-PCR (Flu A&B, Covid)     Status: None   Collection Time: 06/02/22 11:02 AM   Specimen: Nasal Swab  Result Value Ref Range Status   SARS Coronavirus 2 by RT PCR NEGATIVE NEGATIVE Final    Comment: (NOTE) SARS-CoV-2 target nucleic acids are NOT DETECTED.  The SARS-CoV-2 RNA is generally detectable in  upper respiratory specimens during the acute phase of infection. The lowest concentration of SARS-CoV-2 viral copies this assay can detect is 138 copies/mL. A negative result does not preclude SARS-Cov-2 infection and should not be used as the sole basis for treatment or other patient management decisions. A negative result may occur with  improper specimen collection/handling, submission of specimen other than nasopharyngeal swab, presence of viral mutation(s) within the areas targeted by this assay, and inadequate number of viral copies(<138 copies/mL). A negative result must be combined with clinical observations, patient history, and epidemiological information. The expected result is Negative.  Fact Sheet for Patients:  EntrepreneurPulse.com.au  Fact Sheet for Healthcare Providers:  IncredibleEmployment.be  This test is no t yet approved or cleared by the [place] FDA and  has been authorized for detection and/or diagnosis of SARS-CoV-2 by FDA under an Emergency Use Authorization (EUA). This EUA will remain  in effect (meaning this test can be used) for the duration of the COVID-19 declaration under Section 564(b)(1) of the Act, 21 U.S.C.section 360bbb-3(b)(1), unless the authorization is terminated  or revoked sooner.       Influenza A by PCR NEGATIVE NEGATIVE Final   Influenza B by PCR NEGATIVE NEGATIVE Final    Comment: (NOTE) The Xpert Xpress SARS-CoV-2/FLU/RSV plus assay is intended as an aid in the diagnosis of influenza from Nasopharyngeal swab specimens and should not be used as a sole basis for treatment. Nasal washings and aspirates are unacceptable for Xpert Xpress SARS-CoV-2/FLU/RSV testing.  Fact Sheet for Patients: EntrepreneurPulse.com.au  Fact Sheet for Healthcare Providers: IncredibleEmployment.be  This test is not yet approved or cleared by the [place] FDA and has been  authorized for detection and/or diagnosis of SARS-CoV-2 by FDA under an Emergency Use Authorization (EUA). This EUA will remain in effect (meaning this test can be used) for the duration of the COVID-19 declaration under Section 564(b)(1) of the Act, 21 U.S.C. section 360bbb-3(b)(1), unless the authorization is terminated or revoked.  Performed at [facility], [place] [address] [place], [place] [address]      Radiology Studies: CT Angio Chest/Abd/Pel for Dissection W and/or Wo Contrast  Result Date: 06/02/2022 CLINICAL DATA:  76 year old male with chest and abdominal pain, concern for acute aortic syndrome. EXAM: CT ANGIOGRAPHY CHEST, ABDOMEN AND PELVIS TECHNIQUE: Non-contrast CT of the chest was initially obtained. Multidetector CT imaging through the chest, abdomen and pelvis was performed using the standard protocol during bolus administration of intravenous contrast. Multiplanar reconstructed images and MIPs were obtained and reviewed to evaluate the vascular anatomy. RADIATION DOSE REDUCTION: This exam was performed according to the departmental dose-optimization program which includes automated exposure control, adjustment of the mA and/or kV according to patient size and/or use of iterative reconstruction technique. CONTRAST:  143mL OMNIPAQUE IOHEXOL 350 MG/ML SOLN COMPARISON:  Chest CT from 02/21/2022,  chest abdomen pelvis from 11/09/2018 FINDINGS: CTA CHEST FINDINGS VASCULAR Preferential opacification of the thoracic aorta. No evidence of thoracic aortic aneurysm or dissection. Diffuse circumferential coronary atherosclerotic calcifications about the aortic arch and descending thoracic aorta. Normal heart size. No pericardial effusion. Sinues of Valsalva: 31 mm 32 x 31 mm ,unchanged Sinotubular Junction: 32 mm ,unchanged Ascending Aorta: 37 mm ,unchanged Aortic Arch: Fusiform aneurysmal dilation of zone 2 measuring up to 39 mm ,unchanged Descending aorta: 28 mm at the level of the carina  ,unchanged Branch vessels: Conventional branching pattern. Ostial atherosclerotic changes without evidence of flow-limiting stenosis. Coronary arteries: Normal origins and courses. Severe atherosclerotic calcifications. Main pulmonary artery: 33 mm ,unchanged. No evidence of central pulmonary embolism. Pulmonary veins: No anomalous pulmonary venous return. No evidence of left atrial appendage thrombus. NON VASCULAR Mediastinum/Nodes: Interval enlargement of previously visualized prominent mediastinal lymph nodes, the largest in the aortopulmonic window measuring up to 1.7 cm in short axis, previously 1.1 cm. Lungs/Pleura: Severe upper lobe predominant centrilobular emphysema. Diffuse bronchial wall thickening. Interval development trace, right greater than pleural effusions. Bibasilar subsegmental atelectasis. Musculoskeletal: No chest wall abnormality. No acute or significant osseous findings. Review of the MIP images confirms the above findings. CTA ABDOMEN AND PELVIS FINDINGS VASCULAR Aorta: Circumferential fibrofatty and calcific atherosclerotic about the suprarenal abdominal aorta which is normal in caliber and patent. 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 Celiac: Mild ostial stenosis, likely secondary to median arcuate ligament compression. Patent distally. SMA: Moderate proximal stenosis secondary to atherosclerotic plaque. Patent distally. Renals: Single bilateral renal arteries with moderate ostial stenosis secondary to atherosclerotic plaque. IMA: Occluded. Inflow: Similar appearing total occlusion of the common and external iliac arteries which reconstitute distally via the inferior epigastric arteries. Internal iliac reconstitution via prominent arc of [place]. Veins: Duplicated IVC. Limited evaluation of abdominopelvic venous patency secondary to arterial phase image acquisition Review of the MIP images confirms the above findings. NON-VASCULAR Hepatobiliary: Unchanged 1.5 cm right lobe  posterior simple hepatic cysts. The liver is otherwise normal in size, contour, and attenuation. The gallbladder is present with a single punctate partially calcified gallstone near the infundibulum, no surrounding inflammatory changes. No intra or extrahepatic biliary ductal dilation. Pancreas: Unremarkable. No pancreatic ductal dilatation or surrounding inflammatory changes. Spleen: Normal in size without focal abnormality. Adrenals/Urinary Tract: Adrenal glands are unremarkable. Unchanged appearance of multifocal bilateral simple appearing renal cysts. Kidneys are otherwise normal, without renal calculi, focal lesion, or hydronephrosis. Bladder is unremarkable. Stomach/Bowel: Stomach is within normal limits. Appendix is not definitively identified. Redundant sigmoid colon. No evidence of bowel wall thickening, distention, or inflammatory changes. Lymphatic: No abdominopelvic lymphadenopathy. Reproductive: The prostate measures up to 6.5 cm in greatest axial dimension, previously 5.6 cm. Other: No abdominal wall hernia or abnormality. No abdominopelvic ascites. Musculoskeletal: No acute or significant osseous findings. IMPRESSION: VASCULAR 1. No evidence of acute aortic syndrome. 2. 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 3. 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 Recommend annual imaging followup by CTA or MRA. This recommendation follows 2010 ACCF/AHA/AATS/ACR/ASA/SCA/SCAI/SIR/STS/SVM Guidelines for the Diagnosis and Management of Patients with Thoracic Aortic Disease. Circulation.2010; 121ML:4928372. Aortic aneurysm NOS (ICD10-I71.9) 4.  Coronary and aortic Atherosclerosis (ICD10-I70.0). NON-VASCULAR 1. Interval enlargement of mediastinal lymphadenopathy of indeterminate significance. 2. Trace right greater than left pleural effusions. 3. Similar appearing severe emphysema (ICD10-J43.9). 4.  Prostatomegaly. Ruthann Cancer, MD [facility] Radiology Electronically Signed   By:  Ruthann Cancer M.D.   On: 06/02/2022 13:48   DG Chest 2 View  Result Date: 06/02/2022 CLINICAL DATA:  Shortness of breath, chest pain. EXAM: CHEST - 2 VIEW COMPARISON:  Feb 21, 2022. FINDINGS: The heart size and mediastinal contours are within normal limits. Increased interstitial densities are noted in both lung bases consistent with pulmonary edema. Small bilateral pleural effusions are noted. The visualized skeletal structures are unremarkable. IMPRESSION: Increased bibasilar interstitial densities are noted most consistent with pulmonary edema. Small bilateral pleural effusions are noted as well. Electronically Signed   By: Marijo Conception M.D.   On: 06/02/2022 08:13     Scheduled Meds:  allopurinol  300 mg Oral QHS   cilostazol  100 mg Oral BID   famotidine  20 mg Oral Daily   fenofibrate  160 mg Oral Daily   lisinopril  10 mg Oral Q1500   metoprolol succinate  100 mg Oral Daily   pantoprazole  80 mg Oral Daily   sodium chloride flush  3 mL Intravenous Q12H   spironolactone  25 mg Oral Daily   tamsulosin  0.4 mg Oral QPM   Continuous Infusions:  sodium chloride     heparin 1,850 Units/hr (06/03/22 0749)     LOS: 1 day    Time spent: 21min  Domenic Polite, MD [facility]   06/03/2022, 10:25 AM

## 2022-06-03 NOTE — Progress Notes (Signed)
ANTICOAGULATION CONSULT NOTE  Pharmacy Consult for warfarin > heparin Indication: Hx DVT/PE   Allergies  Allergen Reactions   Codeine Hives and Other (See Comments)    Takes benadryl to stop allergic reactions    Patient Measurements: Height: 6\' 1"  (185.4 cm) Weight: 93.4 kg (206 lb) IBW/kg (Calculated) : 79.9  Vital Signs: Temp: 98 F (36.7 C) (08/17 0754) Temp Source: Oral (08/17 0754) BP: 171/58 (08/17 1100) Pulse Rate: 73 (08/17 1100)  Labs: Recent Labs    06/02/22 0752 06/02/22 0953 06/02/22 1215 06/02/22 1235 06/02/22 1530 06/03/22 0257 06/03/22 0544 06/03/22 1054  HGB 15.9  --   --  17.7*  --  15.5  --   --   HCT 48.8  --   --  52.0  --  46.4  --   --   PLT 259  --   --   --   --  255  --   --   LABPROT  --   --  15.4*  --   --  15.8*  --   --   INR  --   --  1.2  --   --  1.3*  --   --   HEPARINUNFRC  --   --   --   --   --  0.20*  --  0.29*  CREATININE 0.91  --  0.96  --   --  1.22  --   --   TROPONINIHS 37* 157*  --   --  359*  --  307*  --      Estimated Creatinine Clearance: 58.2 mL/min (by C-G formula based on SCr of 1.22 mg/dL).  Assessment: 21 YOM presenting with SOB, hx DVT on warfarin PTA, last dose 8/15 and INR on admission is 1.2. PTA dosing: 5mg  daily except Mon and Fri no doses  Heparin level remains slightly subtherapeutic at 0.29, INR remains subtherapeutic as expected at 1.3. CBC is stable and no bleeding noted.   Goal of Therapy:  Heparin level 0.3-0.7 units/ml Monitor platelets by anticoagulation protocol: Yes   Plan:  Increase heparin gtt to 2000 units/hr Warfarin 7.5mg  PO x 1 today Check an 8 hr heparin level Daily heparin level, INR and CBC  Thu, PharmD, BCPS, BCEMP Clinical Pharmacist Please see AMION for all pharmacy numbers 06/03/2022 11:48 AM

## 2022-06-04 ENCOUNTER — Encounter (HOSPITAL_COMMUNITY): Payer: Self-pay | Admitting: Family Medicine

## 2022-06-04 ENCOUNTER — Encounter (HOSPITAL_COMMUNITY)
Admission: EM | Disposition: A | Discharge status: Home / Self Care | Payer: Self-pay | Source: Home / Self Care | Attending: Internal Medicine

## 2022-06-04 DIAGNOSIS — I2510 Atherosclerotic heart disease of native coronary artery without angina pectoris: Secondary | ICD-10-CM | POA: Diagnosis not present

## 2022-06-04 DIAGNOSIS — I214 Non-ST elevation (NSTEMI) myocardial infarction: Secondary | ICD-10-CM

## 2022-06-04 HISTORY — PX: LEFT HEART CATH AND CORONARY ANGIOGRAPHY: CATH118249

## 2022-06-04 HISTORY — PX: CORONARY STENT INTERVENTION: CATH118234

## 2022-06-04 LAB — CBC
HCT: 45.6 % (ref 39.0–52.0)
Hemoglobin: 14.6 g/dL (ref 13.0–17.0)
MCH: 31.9 pg (ref 26.0–34.0)
MCHC: 32 g/dL (ref 30.0–36.0)
MCV: 99.8 fL (ref 80.0–100.0)
Platelets: 247 10*3/uL (ref 150–400)
RBC: 4.57 MIL/uL (ref 4.22–5.81)
RDW: 15.4 % (ref 11.5–15.5)
WBC: 10.2 10*3/uL (ref 4.0–10.5)
nRBC: 0 % (ref 0.0–0.2)

## 2022-06-04 LAB — PROTIME-INR
INR: 1.6 — ABNORMAL HIGH (ref 0.8–1.2)
Prothrombin Time: 18.6 seconds — ABNORMAL HIGH (ref 11.4–15.2)

## 2022-06-04 LAB — BASIC METABOLIC PANEL
Anion gap: 10 (ref 5–15)
BUN: 27 mg/dL — ABNORMAL HIGH (ref 8–23)
CO2: 23 mmol/L (ref 22–32)
Calcium: 8.6 mg/dL — ABNORMAL LOW (ref 8.9–10.3)
Chloride: 102 mmol/L (ref 98–111)
Creatinine, Ser: 1.28 mg/dL — ABNORMAL HIGH (ref 0.61–1.24)
GFR, Estimated: 58 mL/min — ABNORMAL LOW (ref >60)
Glucose, Bld: 104 mg/dL — ABNORMAL HIGH (ref 70–99)
Potassium: 3.7 mmol/L (ref 3.5–5.1)
Sodium: 135 mmol/L (ref 135–145)

## 2022-06-04 LAB — POCT ACTIVATED CLOTTING TIME
Activated Clotting Time: 281 seconds
Activated Clotting Time: 510 seconds

## 2022-06-04 LAB — HEPARIN LEVEL (UNFRACTIONATED): Heparin Unfractionated: 0.46 IU/mL (ref 0.30–0.70)

## 2022-06-04 SURGERY — LEFT HEART CATH AND CORONARY ANGIOGRAPHY
Anesthesia: LOCAL

## 2022-06-04 MED ORDER — WARFARIN SODIUM 5 MG PO TABS: 5.0000 mg | ORAL_TABLET | Freq: Once | ORAL | Status: DC

## 2022-06-04 MED ORDER — MIDAZOLAM HCL 2 MG/2ML IJ SOLN
INTRAMUSCULAR | Status: AC
  Filled 2022-06-04: qty 2

## 2022-06-04 MED ORDER — HEPARIN (PORCINE) IN NACL 1000-0.9 UT/500ML-% IV SOLN
INTRAVENOUS | Status: AC
  Filled 2022-06-04: qty 1000

## 2022-06-04 MED ORDER — SODIUM CHLORIDE 0.9 % IV SOLN: INTRAVENOUS | Status: AC

## 2022-06-04 MED ORDER — MIDAZOLAM HCL 2 MG/2ML IJ SOLN
INTRAMUSCULAR | Status: DC | PRN
  Administered 2022-06-04: 2 mg via INTRAVENOUS
  Administered 2022-06-04: 1 mg via INTRAVENOUS

## 2022-06-04 MED ORDER — CLOPIDOGREL BISULFATE 75 MG PO TABS
75.0000 mg | ORAL_TABLET | Freq: Every day | ORAL | Status: DC
  Administered 2022-06-05 – 2022-06-06 (×2): 75 mg via ORAL
  Filled 2022-06-04 (×2): qty 1

## 2022-06-04 MED ORDER — HEPARIN (PORCINE) 25000 UT/250ML-% IV SOLN
1700.0000 [IU]/h | INTRAVENOUS | Status: DC
  Administered 2022-06-05: 1100 [IU]/h via INTRAVENOUS
  Administered 2022-06-05: 1400 [IU]/h via INTRAVENOUS
  Filled 2022-06-04 (×2): qty 250

## 2022-06-04 MED ORDER — SODIUM CHLORIDE 0.9% FLUSH
3.0000 mL | Freq: Two times a day (BID) | INTRAVENOUS | Status: DC
  Administered 2022-06-04: 3 mL via INTRAVENOUS

## 2022-06-04 MED ORDER — SODIUM CHLORIDE 0.9 % IV SOLN: 250.0000 mL | INTRAVENOUS | Status: DC | PRN

## 2022-06-04 MED ORDER — HYDRALAZINE HCL 20 MG/ML IJ SOLN: 10.0000 mg | INTRAMUSCULAR | Status: AC | PRN

## 2022-06-04 MED ORDER — LIDOCAINE HCL (PF) 1 % IJ SOLN
INTRAMUSCULAR | Status: DC | PRN
  Administered 2022-06-04: 2 mL

## 2022-06-04 MED ORDER — HEPARIN SODIUM (PORCINE) 1000 UNIT/ML IJ SOLN
INTRAMUSCULAR | Status: DC | PRN
  Administered 2022-06-04: 6000 [IU] via INTRAVENOUS
  Administered 2022-06-04: 2000 [IU] via INTRAVENOUS
  Administered 2022-06-04: 4500 [IU] via INTRAVENOUS

## 2022-06-04 MED ORDER — LIDOCAINE HCL (PF) 1 % IJ SOLN
INTRAMUSCULAR | Status: AC
  Filled 2022-06-04: qty 30

## 2022-06-04 MED ORDER — SODIUM CHLORIDE 0.9% FLUSH: 3.0000 mL | INTRAVENOUS | Status: DC | PRN

## 2022-06-04 MED ORDER — ASPIRIN 81 MG PO CHEW
81.0000 mg | CHEWABLE_TABLET | ORAL | Status: AC
  Administered 2022-06-04: 81 mg via ORAL
  Filled 2022-06-04: qty 1

## 2022-06-04 MED ORDER — LABETALOL HCL 5 MG/ML IV SOLN: 10.0000 mg | INTRAVENOUS | Status: AC | PRN

## 2022-06-04 MED ORDER — ASPIRIN 81 MG PO CHEW
81.0000 mg | CHEWABLE_TABLET | Freq: Every day | ORAL | Status: DC
  Administered 2022-06-05 – 2022-06-06 (×2): 81 mg via ORAL
  Filled 2022-06-04 (×2): qty 1

## 2022-06-04 MED ORDER — VERAPAMIL HCL 2.5 MG/ML IV SOLN
INTRAVENOUS | Status: DC | PRN
  Administered 2022-06-04: 10 mL via INTRA_ARTERIAL

## 2022-06-04 MED ORDER — ACETAMINOPHEN 325 MG PO TABS: 650.0000 mg | ORAL_TABLET | ORAL | Status: DC | PRN

## 2022-06-04 MED ORDER — FENTANYL CITRATE (PF) 100 MCG/2ML IJ SOLN
INTRAMUSCULAR | Status: DC | PRN
  Administered 2022-06-04 (×2): 25 ug via INTRAVENOUS

## 2022-06-04 MED ORDER — VERAPAMIL HCL 2.5 MG/ML IV SOLN
INTRAVENOUS | Status: AC
  Filled 2022-06-04: qty 2

## 2022-06-04 MED ORDER — NITROGLYCERIN 1 MG/10 ML FOR IR/CATH LAB
INTRA_ARTERIAL | Status: AC
  Filled 2022-06-04: qty 10

## 2022-06-04 MED ORDER — WARFARIN SODIUM 5 MG PO TABS
5.0000 mg | ORAL_TABLET | Freq: Once | ORAL | Status: AC
Start: 2022-06-04 — End: 2022-06-04
  Administered 2022-06-04: 5 mg via ORAL
  Filled 2022-06-04: qty 1

## 2022-06-04 MED ORDER — ONDANSETRON HCL 4 MG/2ML IJ SOLN: 4.0000 mg | Freq: Four times a day (QID) | INTRAMUSCULAR | Status: DC | PRN

## 2022-06-04 MED ORDER — HEPARIN SODIUM (PORCINE) 1000 UNIT/ML IJ SOLN
INTRAMUSCULAR | Status: AC
  Filled 2022-06-04: qty 10

## 2022-06-04 MED ORDER — SODIUM CHLORIDE 0.9% FLUSH
3.0000 mL | Freq: Two times a day (BID) | INTRAVENOUS | Status: DC
  Administered 2022-06-04 – 2022-06-06 (×2): 3 mL via INTRAVENOUS

## 2022-06-04 MED ORDER — NITROGLYCERIN 1 MG/10 ML FOR IR/CATH LAB
INTRA_ARTERIAL | Status: DC | PRN
  Administered 2022-06-04: 200 ug
  Administered 2022-06-04: 500 ug via INTRA_ARTERIAL

## 2022-06-04 MED ORDER — CLOPIDOGREL BISULFATE 300 MG PO TABS
ORAL_TABLET | ORAL | Status: AC
  Filled 2022-06-04: qty 2

## 2022-06-04 MED ORDER — FENTANYL CITRATE (PF) 100 MCG/2ML IJ SOLN
INTRAMUSCULAR | Status: AC
  Filled 2022-06-04: qty 2

## 2022-06-04 MED ORDER — HEPARIN (PORCINE) IN NACL 1000-0.9 UT/500ML-% IV SOLN
INTRAVENOUS | Status: DC | PRN
  Administered 2022-06-04 (×2): 500 mL

## 2022-06-04 MED ORDER — IOHEXOL 350 MG/ML SOLN
INTRAVENOUS | Status: DC | PRN
  Administered 2022-06-04: 110 mL

## 2022-06-04 MED ORDER — CLOPIDOGREL BISULFATE 300 MG PO TABS
ORAL_TABLET | ORAL | Status: DC | PRN
  Administered 2022-06-04: 600 mg via ORAL

## 2022-06-04 SURGICAL SUPPLY — 19 items
BALL SAPPHIRE NC24 3.5X12 (BALLOONS) ×1
BALLN SAPPHIRE 2.5X12 (BALLOONS) ×1
BALLOON SAPPHIRE 2.5X12 (BALLOONS) IMPLANT
BALLOON SAPPHIRE NC24 3.5X12 (BALLOONS) IMPLANT
CATH 5FR JL3.5 JR4 ANG PIG MP (CATHETERS) IMPLANT
CATH LAUNCHER 6FR EBU3.5 (CATHETERS) IMPLANT
DEVICE RAD COMP TR BAND LRG (VASCULAR PRODUCTS) IMPLANT
GLIDESHEATH SLEND SS 6F .021 (SHEATH) IMPLANT
GUIDEWIRE INQWIRE 1.5J.035X260 (WIRE) IMPLANT
INQWIRE 1.5J .035X260CM (WIRE) ×1
KIT ENCORE 26 ADVANTAGE (KITS) IMPLANT
KIT HEART LEFT (KITS) ×1 IMPLANT
PACK CARDIAC CATHETERIZATION (CUSTOM PROCEDURE TRAY) ×1 IMPLANT
STENT SYNERGY XD 3.0X16 (Permanent Stent) IMPLANT
SYNERGY XD 3.0X16 (Permanent Stent) ×1 IMPLANT
TRANSDUCER W/STOPCOCK (MISCELLANEOUS) ×1 IMPLANT
TUBING CIL FLEX 10 FLL-RA (TUBING) ×1 IMPLANT
VALVE GUARDIAN II ~~LOC~~ HEMO (MISCELLANEOUS) IMPLANT
WIRE ASAHI PROWATER 180CM (WIRE) IMPLANT

## 2022-06-04 NOTE — Progress Notes (Signed)
ANTICOAGULATION CONSULT NOTE- follow-up  Pharmacy Consult for warfarin with heparin bridge Indication: Hx DVT/PE   Allergies  Allergen Reactions   Codeine Hives and Other (See Comments)    Takes benadryl to stop allergic reactions    Patient Measurements: Height: 6\' 1"  (185.4 cm) Weight: 93.4 kg (206 lb) IBW/kg (Calculated) : 79.9 Heparin dosing weight 93.4 kg  Vital Signs: Temp: 97.8 F (36.6 C) (08/18 2148) Temp Source: Oral (08/18 2148) BP: 108/50 (08/18 2148) Pulse Rate: 68 (08/18 2148)  Labs: Recent Labs    06/02/22 0752 06/02/22 0953 06/02/22 1215 06/02/22 1235 06/02/22 1235 06/02/22 1530 06/03/22 0257 06/03/22 0544 06/03/22 1054 06/03/22 2008 06/04/22 0223  HGB 15.9  --   --  17.7*  --   --  15.5  --   --   --  14.6  HCT 48.8  --   --  52.0  --   --  46.4  --   --   --  45.6  PLT 259  --   --   --   --   --  255  --   --   --  247  LABPROT  --   --  15.4*  --   --   --  15.8*  --   --   --  18.6*  INR  --   --  1.2  --   --   --  1.3*  --   --   --  1.6*  HEPARINUNFRC  --   --   --   --    < >  --  0.20*  --  0.29* 0.40 0.46  CREATININE 0.91  --  0.96  --   --   --  1.22  --   --   --  1.28*  TROPONINIHS 37* 157*  --   --   --  359*  --  307*  --   --   --    < > = values in this interval not displayed.     Estimated Creatinine Clearance: 55.5 mL/min (A) (by C-G formula based on SCr of 1.28 mg/dL (H)).  Assessment: 39 YOM presenting with SOB, hx DVT on warfarin PTA, last dose 8/15 and INR on admission is 1.2. PTA dosing: 5mg  daily except Mon and Fri no doses  Patient is now s/p 2x doses of 7.5 mg warfarin. Hgb stable and wnl at 14.6, plts wnl at 247. No bleeding noted. Eating 75-100% of meals.   Heparin level this morning ~2 am remains therapeutic at 0.46. INR increased but remains sub-therapeutic at 1.6.  PM Update: Completed catheterization. Heparin to restart 2 hours after TR band removed.   Goal of Therapy:  Heparin level 0.3-0.7  units/ml INR 2-3 Monitor platelets by anticoagulation protocol: Yes   Plan:  Restart heparin at 1100 units/hr starting 8/19 @0100  Check 8hr heparin level 8/19 @0900    9/19, PharmD Clinical Pharmacist

## 2022-06-04 NOTE — Plan of Care (Signed)

## 2022-06-04 NOTE — Care Management Important Message (Signed)
Important Message  Patient Details  Name: Tony Zhang MRN: [id number] Date of Birth: Jan 07, 1946   Medicare Important Message Given:  Yes     Sherilyn Banker 06/04/2022, 11:20 AM

## 2022-06-04 NOTE — Progress Notes (Signed)
PROGRESS NOTE    Tony Zhang  [id number] DOB: [birth year] DOA: 06/02/2022 PCP: Tamsen Roers, MD  76/M with history of COPD, former smoker, history of DVT/PE, chronic diastolic CHF, bilateral renal artery stenosis, chronic pain presented to the ED with intermittent chest pain and dyspnea X 1 week he describes the chest pain as pleuritic, in addition has been having worsening dyspnea on exertion for 1 month, has some edema chronically, uses compression stockings. -In the ED BP elevated, troponin 37> 150> 359, chest x-ray with pulmonary edema and small bilateral effusions, CTA chest with chronic infrarenal abdominal aortic occlusion, emphysema, mediastinal adenopathy, prostatomegaly -Admitted, started on diuretics  Subjective: Feels better, breathing improving, denies any chest pain today  Assessment and Plan:  Acute respiratory failure with hypoxia (HCC) Acute on chronic diastolic CHF -In the background of known COPD, former smoker, CT evidence of emphysema -Repeat echo with EF of 50%, grade 1 DD and new wall motion abnormality, -Cards following, plan for left heart cath today, hold additional IV Lasix and lisinopril -Now on Aldactone and Jardiance -Continue metoprolol  Hypertensive urgency with elevated troponins and chest pain  -CTA chest/abdo/pelvis negative for aortic dissection -troponin elevated from [phone]. nstemi vs. 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 Continue annual f/u  Suprarenal abdominal aortic aneurysm, measuring up to 3.5 cm (from CT abdomen in 5/23) continue regular f/u  -Continue Coumadin, start statin, LDL is 85  DVT, lower extremity and pulmonary embolism, recurrent INR 1.2, subtherapeutic>heparing gtt with chest pain/elevated troponin   warfarin per pharmacy dosing   COPD (chronic obstructive pulmonary disease) (HCC) -Stable, no exacerbation at this time, continue as needed nebs  Peripheral vascular disease (HCC) Continue pletal, fenofibrate, coumadin  Hyperlipidemia Continue fenofibrate -Continue Lipitor  Gout Continue allopurinol   DVT prophylaxis: IV heparin, Coumadin Code Status: Full code Family Communication: Discussed with patient in detail, no family at bedside Disposition Plan: Home pending cardiac work-up  Consultants:  Cardiology  Procedures:   Antimicrobials:    Objective: Vitals:   06/03/22 1554 06/03/22 1959 06/04/22 0443 06/04/22 0904  BP: (!) 157/75 (!) 121/58 135/60 (!) 167/71  Pulse: 65 67 68 72  Resp: 17 14 20 17   Temp: 97.8 F (36.6 C) 98.4 F (36.9 C) 98.5 F (36.9 C) 98.3 F (36.8 C)  TempSrc: Oral  Oral Oral  SpO2: 95% 93% 98% 94%  Weight:      Height:        Intake/Output Summary (Last 24 hours) at 06/04/2022 1337 Last data filed at 06/04/2022 [id number] Gross per 24 hour  Intake 902.94 ml  Output 875 ml  Net 27.94 ml   Filed Weights   06/03/22 1100  Weight: 93.4 kg    Examination:  Chronically ill male sitting up in bed, appears older than stated age, AAOx3 HEENT: No JVD CVS: S1-S2, regular rhythm Lungs: Improved air movement, decreased at the bases Abdomen: Soft, nontender, bowel sounds present Extremities: Trace edema Skin: No rashes on exposed skin Psychiatry:  Mood & affect appropriate.  Data Reviewed:   CBC: Recent Labs  Lab 06/02/22 0752 06/02/22 1235 06/03/22 0257 06/04/22 0223  WBC 11.2*   --  9.3 10.2  NEUTROABS 9.6*  --   --   --   HGB 15.9 17.7* 15.5 14.6  HCT 48.8 52.0 46.4 45.6  MCV 98.2  --  96.5 99.8  PLT 259  --  255 247   Basic Metabolic Panel: Recent Labs  Lab 06/02/22 0752 06/02/22 1215 06/02/22 1235 06/03/22 0257 06/04/22 0223  NA 135 136 135 133* 135  K 4.2 4.1 5.1 4.1 3.7  CL 106 105  --  103 102  CO2 17* 17*  --  20* 23  GLUCOSE 149* 151*  --  149* 104*  BUN 13 13  --  20 27*  CREATININE 0.91 0.96  --  1.22 1.28*  CALCIUM 8.8* 8.9  --  8.7* 8.6*   GFR: Estimated Creatinine Clearance: 55.5 mL/min (A) (by C-G formula based on SCr of 1.28 mg/dL (H)). Liver Function Tests: Recent Labs  Lab 06/02/22 1215  AST 16  ALT 10  ALKPHOS 88  BILITOT 1.6*  PROT 7.4  ALBUMIN 3.6   Recent Labs  Lab 06/02/22 0752  LIPASE 25   No results for input(s): "AMMONIA" in the last 168 hours. Coagulation Profile: Recent Labs  Lab 06/02/22 1215 06/03/22 0257 06/04/22 0223  INR 1.2 1.3* 1.6*   Cardiac Enzymes: No results for input(s): "CKTOTAL", "CKMB", "CKMBINDEX", "TROPONINI" in the last 168 hours. BNP (last 3 results) No results for input(s): "PROBNP" in the last 8760 hours. HbA1C: Recent Labs    06/03/22 0257  HGBA1C 5.1   CBG: No results for input(s): "GLUCAP" in the last 168 hours. Lipid Profile: Recent Labs    06/03/22 0257  CHOL 140  HDL 36*  LDLCALC 85  TRIG 97  CHOLHDL 3.9   Thyroid Function Tests: Recent Labs    06/02/22 2133  TSH 0.818   Anemia Panel: No results for input(s): "VITAMINB12", "FOLATE", "FERRITIN", "TIBC", "IRON", "RETICCTPCT" in the last 72 hours. Urine analysis:    Component Value Date/Time   COLORURINE YELLOW 02/21/2022 1357   APPEARANCEUR CLEAR 02/21/2022 1357   LABSPEC 1.015 02/21/2022 1357   PHURINE 5.0 02/21/2022 1357   GLUCOSEU NEGATIVE 02/21/2022 1357   HGBUR NEGATIVE 02/21/2022 1357   BILIRUBINUR NEGATIVE 02/21/2022 1357   KETONESUR NEGATIVE 02/21/2022 1357   PROTEINUR NEGATIVE 02/21/2022  1357   UROBILINOGEN 0.2 04/08/2015 2033   NITRITE NEGATIVE 02/21/2022 1357   LEUKOCYTESUR NEGATIVE 02/21/2022 1357   Sepsis Labs: @LABRCNTIP (procalcitonin:4,lacticidven:4)  ) Recent Results (from the past 240 hour(s))  SARS Coronavirus 2 by RT PCR (hospital order, performed in [facility] hospital lab) *cepheid single result test* Anterior Nasal Swab     Status: None   Collection Time: 06/02/22  7:42 AM   Specimen: Anterior Nasal Swab  Result Value Ref Range Status   SARS Coronavirus 2 by RT PCR NEGATIVE NEGATIVE Final    Comment: (NOTE) SARS-CoV-2 target nucleic acids are NOT DETECTED.  The SARS-CoV-2 RNA is generally detectable in upper and lower respiratory specimens during the acute phase of infection. The lowest concentration of SARS-CoV-2 viral copies this assay can detect is 250 copies / mL. A negative result does not preclude SARS-CoV-2 infection and should not be used as the sole basis for treatment or other patient management decisions.  A negative result may occur with improper specimen collection / handling, submission of specimen other than nasopharyngeal swab, presence of viral mutation(s)  within the areas targeted by this assay, and inadequate number of viral copies (<250 copies / mL). A negative result must be combined with clinical observations, patient history, and epidemiological information.  Fact Sheet for Patients:   RoadLapTop.co.za  Fact Sheet for Healthcare Providers: http://kim-miller.com/  This test is not yet approved or  cleared by the [place] FDA and has been authorized for detection and/or diagnosis of SARS-CoV-2 by FDA under an Emergency Use Authorization (EUA).  This EUA will remain in effect (meaning this test can be used) for the duration of the COVID-19 declaration under Section 564(b)(1) of the Act, 21 U.S.C. section 360bbb-3(b)(1), unless the authorization is terminated or revoked  sooner.  Performed at [facility], [address], [place], [place] [address]   Resp Panel by RT-PCR (Flu A&B, Covid)     Status: None   Collection Time: 06/02/22 11:02 AM   Specimen: Nasal Swab  Result Value Ref Range Status   SARS Coronavirus 2 by RT PCR NEGATIVE NEGATIVE Final    Comment: (NOTE) SARS-CoV-2 target nucleic acids are NOT DETECTED.  The SARS-CoV-2 RNA is generally detectable in upper respiratory specimens during the acute phase of infection. The lowest concentration of SARS-CoV-2 viral copies this assay can detect is 138 copies/mL. A negative result does not preclude SARS-Cov-2 infection and should not be used as the sole basis for treatment or other patient management decisions. A negative result may occur with  improper specimen collection/handling, submission of specimen other than nasopharyngeal swab, presence of viral mutation(s) within the areas targeted by this assay, and inadequate number of viral copies(<138 copies/mL). A negative result must be combined with clinical observations, patient history, and epidemiological information. The expected result is Negative.  Fact Sheet for Patients:  BloggerCourse.com  Fact Sheet for Healthcare Providers:  SeriousBroker.it  This test is no t yet approved or cleared by the [place] FDA and  has been authorized for detection and/or diagnosis of SARS-CoV-2 by FDA under an Emergency Use Authorization (EUA). This EUA will remain  in effect (meaning this test can be used) for the duration of the COVID-19 declaration under Section 564(b)(1) of the Act, 21 U.S.C.section 360bbb-3(b)(1), unless the authorization is terminated  or revoked sooner.       Influenza A by PCR NEGATIVE NEGATIVE Final   Influenza B by PCR NEGATIVE NEGATIVE Final    Comment: (NOTE) The Xpert Xpress SARS-CoV-2/FLU/RSV plus assay is intended as an aid in the diagnosis of influenza  from Nasopharyngeal swab specimens and should not be used as a sole basis for treatment. Nasal washings and aspirates are unacceptable for Xpert Xpress SARS-CoV-2/FLU/RSV testing.  Fact Sheet for Patients: BloggerCourse.com  Fact Sheet for Healthcare Providers: SeriousBroker.it  This test is not yet approved or cleared by the [place] FDA and has been authorized for detection and/or diagnosis of SARS-CoV-2 by FDA under an Emergency Use Authorization (EUA). This EUA will remain in effect (meaning this test can be used) for the duration of the COVID-19 declaration under Section 564(b)(1) of the Act, 21 U.S.C. section 360bbb-3(b)(1), unless the authorization is terminated or revoked.  Performed at [facility], [address] [place], [place] [address]      Radiology Studies: ECHOCARDIOGRAM LIMITED  Result Date: 06/03/2022    ECHOCARDIOGRAM LIMITED REPORT   Patient Name:   Tony Zhang Date of Exam: 06/03/2022 Medical Rec #:  [id number]     Height:       73.0 in Accession #:  [id number]    Weight:       206.0 lb Date of Birth:  1946/09/29     BSA:          2.179 m Patient Age:    75 years      BP:           166/74 mmHg Patient Gender: M             HR:           72 bpm. Exam Location:  Inpatient Procedure: Limited Echo, Cardiac Doppler, Color Doppler and Intracardiac            Opacification Agent Indications:    Chest Pain R07.9  History:        Patient has prior history of Echocardiogram examinations, most                 recent 02/22/2022. COPD and PAD, Aortic Valve Disease; Risk                 Factors:Former Smoker and Hypertension. AAA.  Sonographer:    Darlina Sicilian RDCS Referring Phys: [place]  Sonographer Comments: Image acquisition and PEDOF windows challenging due to respiratory motion. IMPRESSIONS  1. Left ventricular ejection fraction, by estimation, is 50 to 55%. The left ventricle has low normal function.  The left ventricle demonstrates regional wall motion abnormalities (see scoring diagram/findings for description). Left ventricular diastolic  parameters are consistent with Grade I diastolic dysfunction (impaired relaxation). Elevated left ventricular end-diastolic pressure. The E/e' is 65. There is severe hypokinesis of the left ventricular, mid-apical inferior wall, inferoseptal wall and apical segment. The apex appears anerusymal.  2. The mitral valve is abnormal. Trivial mitral valve regurgitation.  3. The aortic valve is calcified. There is severe calcifcation of the aortic valve. Moderate to severe aortic valve stenosis. Aortic valve area, by VTI measures 0.48 cm. Aortic valve mean gradient measures 36.0 mmHg. Aortic valve Vmax measures 3.57 m/s. Peak gradient 51 mmHg, DI 0.21.  4. There is normal pulmonary artery systolic pressure.  5. The inferior vena cava is normal in size with greater than 50% respiratory variability, suggesting right atrial pressure of 3 mmHg. Comparison(s): Prior images reviewed side by side. Changes from prior study are noted. 02/22/2022: LVEF >75%, mild (probably moderate) AS - mean gradient 16 mmHg, DI 0.33. FINDINGS  Left Ventricle: Left ventricular ejection fraction, by estimation, is 50 to 55%. The left ventricle has low normal function. The left ventricle demonstrates regional wall motion abnormalities. Severe hypokinesis of the left ventricular, mid-apical inferior wall, inferoseptal wall and apical segment. Definity contrast agent was given IV to delineate the left ventricular endocardial borders. Left ventricular diastolic parameters are consistent with Grade I diastolic dysfunction (impaired relaxation). Elevated left ventricular end-diastolic pressure. The E/e' is 1.  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 The mid and distal inferior wall and apical septal segment are hypokinetic. The mid inferoseptal segment is normal. The apex appears anerusymal. Right Ventricle: There  is normal pulmonary artery systolic pressure. The tricuspid regurgitant velocity is 2.77 m/s, and with an assumed right atrial pressure of 3 mmHg, the estimated right ventricular systolic pressure is [id number] mmHg. Mitral Valve: The mitral valve is abnormal. Mild to moderate mitral annular calcification. Trivial mitral valve regurgitation. Tricuspid Valve: The tricuspid valve is grossly normal. Tricuspid valve regurgitation is mild. Aortic Valve: The aortic valve is calcified. There is severe calcifcation of the aortic valve. Severe aortic stenosis is present. Aortic valve  mean gradient measures 36.0 mmHg. Aortic valve peak gradient measures 51.0 mmHg. Aortic valve area, by VTI measures 0.48 cm. Venous: The inferior vena cava is normal in size with greater than 50% respiratory variability, suggesting right atrial pressure of 3 mmHg. LEFT VENTRICLE PLAX 2D LVOT diam:     1.70 cm      Diastology LV SV:         43           LV e' medial:    5.66 cm/s LV SV Index:   20           LV E/e' medial:  19.1 LVOT Area:     2.27 cm     LV e' lateral:   6.96 cm/s                             LV E/e' lateral: 15.5  LV Volumes (MOD) LV vol d, MOD A2C: 85.3 ml LV vol d, MOD A4C: 104.0 ml LV vol s, MOD A2C: 24.7 ml LV vol s, MOD A4C: 28.7 ml LV SV MOD A2C:     60.6 ml LV SV MOD A4C:     104.0 ml LV SV MOD BP:      68.3 ml AORTIC VALVE AV Area (Vmax):    0.47 cm AV Area (Vmean):   0.46 cm AV Area (VTI):     0.48 cm AV Vmax:           357.00 cm/s AV Vmean:          292.000 cm/s AV VTI:            0.898 m AV Peak Grad:      51.0 mmHg AV Mean Grad:      36.0 mmHg LVOT Vmax:         74.20 cm/s LVOT Vmean:        59.800 cm/s LVOT VTI:          0.189 m LVOT/AV VTI ratio: 0.21 MITRAL VALVE                TRICUSPID VALVE MV Area (PHT): 3.40 cm     TR Peak grad:   30.7 mmHg MV Decel Time: 223 msec     TR Vmax:        277.00 cm/s MV E velocity: 108.00 cm/s MV A velocity: 114.00 cm/s  SHUNTS MV E/A ratio:  0.95         Systemic VTI:  0.19 m                              Systemic Diam: 1.70 cm Lyman Bishop MD Electronically signed by Lyman Bishop MD Signature Date/Time: 06/03/2022/10:39:17 AM    Final      Scheduled Meds:  allopurinol  300 mg Oral QHS   atorvastatin  40 mg Oral Daily   cilostazol  100 mg Oral BID   empagliflozin  10 mg Oral Daily   famotidine  20 mg Oral Daily   fenofibrate  160 mg Oral Daily   metoprolol succinate  100 mg Oral Daily   pantoprazole  80 mg Oral Daily   sodium chloride flush  3 mL Intravenous Q12H   sodium chloride flush  3 mL Intravenous Q12H   spironolactone  25 mg Oral Daily   tamsulosin  0.4 mg Oral QPM   warfarin  5 mg  Oral ONCE-1600   Warfarin - Pharmacist Dosing Inpatient   Does not apply q1600   Continuous Infusions:  sodium chloride     heparin 2,000 Units/hr (06/04/22 1021)     LOS: 2 days    Time spent: 25min  Domenic Polite, MD [facility]   06/04/2022, 1:37 PM

## 2022-06-04 NOTE — Progress Notes (Signed)
ANTICOAGULATION CONSULT NOTE- follow-up  Pharmacy Consult for warfarin with heparin bridge Indication: Hx DVT/PE   Allergies  Allergen Reactions   Codeine Hives and Other (See Comments)    Takes benadryl to stop allergic reactions    Patient Measurements: Height: 6\' 1"  (185.4 cm) Weight: 93.4 kg (206 lb) IBW/kg (Calculated) : 79.9 Heparin dosing weight 93.4 kg  Vital Signs: Temp: 98.3 F (36.8 C) (08/18 0904) Temp Source: Oral (08/18 0904) BP: 167/71 (08/18 0904) Pulse Rate: 72 (08/18 0904)  Labs: Recent Labs    06/02/22 0752 06/02/22 0953 06/02/22 1215 06/02/22 1235 06/02/22 1235 06/02/22 1530 06/03/22 0257 06/03/22 0544 06/03/22 1054 06/03/22 2008 06/04/22 0223  HGB 15.9  --   --  17.7*  --   --  15.5  --   --   --  14.6  HCT 48.8  --   --  52.0  --   --  46.4  --   --   --  45.6  PLT 259  --   --   --   --   --  255  --   --   --  247  LABPROT  --   --  15.4*  --   --   --  15.8*  --   --   --  18.6*  INR  --   --  1.2  --   --   --  1.3*  --   --   --  1.6*  HEPARINUNFRC  --   --   --   --    < >  --  0.20*  --  0.29* 0.40 0.46  CREATININE 0.91  --  0.96  --   --   --  1.22  --   --   --  1.28*  TROPONINIHS 37* 157*  --   --   --  359*  --  307*  --   --   --    < > = values in this interval not displayed.     Estimated Creatinine Clearance: 55.5 mL/min (A) (by C-G formula based on SCr of 1.28 mg/dL (H)).  Assessment: 41 YOM presenting with SOB, hx DVT on warfarin PTA, last dose 8/15 and INR on admission is 1.2. PTA dosing: 5mg  daily except Mon and Fri no doses  Patient is now s/p 2x doses of 7.5 mg warfarin. Hgb stable and wnl at 14.6, plts wnl at 247. No bleeding noted. Eating 75-100% of meals.   Heparin level this morning ~2 am remains therapeutic at 0.46. INR increased but remains sub-therapeutic at 1.6.   Goal of Therapy:  Heparin level 0.3-0.7 units/ml INR 2-3 Monitor platelets by anticoagulation protocol: Yes   Plan:  Continue heparin gtt  at 2000 units/hr Warfarin 5 mg x1 tonight  Daily heparin level, INR and CBC Follow up signs of bleeding, clinical status    [place], PharmD, BCPS, BCOP Clinical Pharmacist

## 2022-06-04 NOTE — Progress Notes (Signed)
Heart Failure Stewardship Pharmacist Progress Note   PCP: [name], MD PCP-Cardiologist: Thurmon Fair, MD    HPI:  76 yo M with PMH of COPD, DVT/PE, CHF, AAA, bilateral renal artery stenosis, and PVD.   He presented to the ED on 8/16 with shortness of breath, chest pain, and chronic edema. CXR with increased bibasilar edema. CTA negative for PE or dissection. An ECHO was done on 8/17 with LVEF 50-55%, regional wall motion abnormalities, G1DD, and moderate-severe aortic stenosis.   LHC planned for today.  Current HF Medications: Beta Blocker: metoprolol XL 100 mg daily MRA: spironolactone 25 mg daily SGLT2i: Jardiance 10 mg daily  Prior to admission HF Medications: Beta blocker: metoprolol XL 100 mg daily ACE/ARB/ARNI: lisinopril 10 mg daily  Pertinent Lab Values: Serum creatinine 1.28, BUN 27, Potassium 3.7, Sodium 135, BNP 1245.2, A1c 5.6  Vital Signs: Weight: 206 lbs (admission weight: 206 lbs) Blood pressure: 160/70s  Heart rate: 60-70s  I/O: -1.1L yesterday; net -1.5L  Medication Assistance / Insurance Benefits Check: Does the patient have prescription insurance?  Yes Type of insurance plan: [facility] Medicare  Outpatient Pharmacy:  Prior to admission outpatient pharmacy: [facility] Is the patient willing to use [facility] TOC pharmacy at discharge? Yes Is the patient willing to transition their outpatient pharmacy to utilize a [facility] outpatient pharmacy?   Pending    Assessment: 1. Acute on chronic diastolic CHF (LVEF 50-55%). NYHA class II symptoms. - No JVD or edema on exam, no indications for diuretics - Continue metoprolol XL 100 mg daily - Off lisinopril with cath. Last dose 8/17 @ 1400. Consider starting Entresto tomorrow morning to allow for 36h washout period.  - Continue spironolactone 25 mg daily - Continue Jardiance 10 mg daily   Plan: 1) Medication changes recommended at this time: - Start Entresto 24/26 mg BID in AM  2) Patient assistance: -  Entresto copay $10.35 - Jardiance/Farxiga copay $10.35  3)  Education  - To be completed prior to discharge  Sharen Hones, PharmD, BCPS Heart Failure Stewardship Pharmacist Phone [phone]

## 2022-06-04 NOTE — H&P (View-Only) (Signed)
Progress Note  Patient Name: KRISTION DEFORGE Date of Encounter: 06/04/2022  [facility] Cardiologist: Sanda Klein, MD   Subjective   Still with some pleuritic type chest discomfort.  Breathing is improved.  Discomfort is in the center of the chest on the left lateral chest areas.  Inpatient Medications    Scheduled Meds:  allopurinol  300 mg Oral QHS   atorvastatin  40 mg Oral Daily   cilostazol  100 mg Oral BID   empagliflozin  10 mg Oral Daily   famotidine  20 mg Oral Daily   fenofibrate  160 mg Oral Daily   metoprolol succinate  100 mg Oral Daily   pantoprazole  80 mg Oral Daily   sodium chloride flush  3 mL Intravenous Q12H   spironolactone  25 mg Oral Daily   tamsulosin  0.4 mg Oral QPM   warfarin  5 mg Oral ONCE-1600   Warfarin - Pharmacist Dosing Inpatient   Does not apply q1600   Continuous Infusions:  sodium chloride     heparin 2,000 Units/hr (06/04/22 1021)   PRN Meds: sodium chloride, acetaminophen **OR** acetaminophen, albuterol, cyclobenzaprine, hydrALAZINE, morphine injection, ondansetron **OR** ondansetron (ZOFRAN) IV, oxyCODONE, sodium chloride flush   Vital Signs    Vitals:   06/03/22 1554 06/03/22 1959 06/04/22 0443 06/04/22 0904  BP: (!) 157/75 (!) 121/58 135/60 (!) 167/71  Pulse: 65 67 68 72  Resp: 17 14 20 17   Temp: 97.8 F (36.6 C) 98.4 F (36.9 C) 98.5 F (36.9 C) 98.3 F (36.8 C)  TempSrc: Oral  Oral Oral  SpO2: 95% 93% 98% 94%  Weight:      Height:        Intake/Output Summary (Last 24 hours) at 06/04/2022 1221 Last data filed at 06/04/2022 0842 Gross per 24 hour  Intake 902.94 ml  Output 875 ml  Net 27.94 ml      06/03/2022   11:00 AM 02/21/2022    7:26 PM 09/13/2020    6:47 AM  Last 3 Weights  Weight (lbs) 206 lb 206 lb 231 lb  Weight (kg) 93.441 kg 93.441 kg 104.781 kg      Telemetry    Sinus rhythm without significant arrhythmia- Personally Reviewed  ECG    Normal sinus rhythm with anteroseptal infarct and  inferior infarct, both age undetermined.  No significant changes from previous EKG tracing. ``- Personally Reviewed  Physical Exam  Alert, oriented, no distress GEN: No acute distress.   Neck: No JVD Cardiac: RRR, no murmurs, rubs, or gallops.  Respiratory: Clear to auscultation bilaterally. GI: Soft, nontender, non-distended  MS: No edema; No deformity.  Left radial pulse 2+, right radial pulse 1+ Neuro:  Nonfocal  Psych: Normal affect   Labs    High Sensitivity Troponin:   Recent Labs  Lab 06/02/22 0752 06/02/22 0953 06/02/22 1530 06/03/22 0544  TROPONINIHS 37* 157* 359* 307*     Chemistry Recent Labs  Lab 06/02/22 1215 06/02/22 1235 06/03/22 0257 06/04/22 0223  NA 136 135 133* 135  K 4.1 5.1 4.1 3.7  CL 105  --  103 102  CO2 17*  --  20* 23  GLUCOSE 151*  --  149* 104*  BUN 13  --  20 27*  CREATININE 0.96  --  1.22 1.28*  CALCIUM 8.9  --  8.7* 8.6*  PROT 7.4  --   --   --   ALBUMIN 3.6  --   --   --   AST  16  --   --   --   ALT 10  --   --   --   ALKPHOS 88  --   --   --   BILITOT 1.6*  --   --   --   GFRNONAA >60  --  >60 58*  ANIONGAP 14  --  10 10    Lipids  Recent Labs  Lab 06/03/22 0257  CHOL 140  TRIG 97  HDL 36*  LDLCALC 85  CHOLHDL 3.9    Hematology Recent Labs  Lab 06/02/22 0752 06/02/22 1235 06/03/22 0257 06/04/22 0223  WBC 11.2*  --  9.3 10.2  RBC 4.97  --  4.81 4.57  HGB 15.9 17.7* 15.5 14.6  HCT 48.8 52.0 46.4 45.6  MCV 98.2  --  96.5 99.8  MCH 32.0  --  32.2 31.9  MCHC 32.6  --  33.4 32.0  RDW 15.5  --  15.1 15.4  PLT 259  --  255 247   Thyroid  Recent Labs  Lab 06/02/22 2133  TSH 0.818    BNP Recent Labs  Lab 06/02/22 0953  BNP 1,245.2*    DDimer No results for input(s): "DDIMER" in the last 168 hours.   Radiology    ECHOCARDIOGRAM LIMITED  Result Date: 06/03/2022    ECHOCARDIOGRAM LIMITED REPORT   Patient Name:   KENNIE GARINO Date of Exam: 06/03/2022 Medical Rec #:  [id number]     Height:       73.0 in  Accession #:    [id number]    Weight:       206.0 lb Date of Birth:  January 26, 1946     BSA:          2.179 m Patient Age:    55 years      BP:           166/74 mmHg Patient Gender: M             HR:           72 bpm. Exam Location:  Inpatient Procedure: Limited Echo, Cardiac Doppler, Color Doppler and Intracardiac            Opacification Agent Indications:    Chest Pain R07.9  History:        Patient has prior history of Echocardiogram examinations, most                 recent 02/22/2022. COPD and PAD, Aortic Valve Disease; Risk                 Factors:Former Smoker and Hypertension. AAA.  Sonographer:    Darlina Sicilian RDCS Referring Phys: [place]  Sonographer Comments: Image acquisition and PEDOF windows challenging due to respiratory motion. IMPRESSIONS  1. Left ventricular ejection fraction, by estimation, is 50 to 55%. The left ventricle has low normal function. The left ventricle demonstrates regional wall motion abnormalities (see scoring diagram/findings for description). Left ventricular diastolic  parameters are consistent with Grade I diastolic dysfunction (impaired relaxation). Elevated left ventricular end-diastolic pressure. The E/e' is 58. There is severe hypokinesis of the left ventricular, mid-apical inferior wall, inferoseptal wall and apical segment. The apex appears anerusymal.  2. The mitral valve is abnormal. Trivial mitral valve regurgitation.  3. The aortic valve is calcified. There is severe calcifcation of the aortic valve. Moderate to severe aortic valve stenosis. Aortic valve area, by VTI measures 0.48 cm. Aortic valve mean gradient measures 36.0 mmHg. Aortic  valve Vmax measures 3.57 m/s. Peak gradient 51 mmHg, DI 0.21.  4. There is normal pulmonary artery systolic pressure.  5. The inferior vena cava is normal in size with greater than 50% respiratory variability, suggesting right atrial pressure of 3 mmHg. Comparison(s): Prior images reviewed side by side. Changes from prior  study are noted. 02/22/2022: LVEF >75%, mild (probably moderate) AS - mean gradient 16 mmHg, DI 0.33. FINDINGS  Left Ventricle: Left ventricular ejection fraction, by estimation, is 50 to 55%. The left ventricle has low normal function. The left ventricle demonstrates regional wall motion abnormalities. Severe hypokinesis of the left ventricular, mid-apical inferior wall, inferoseptal wall and apical segment. Definity contrast agent was given IV to delineate the left ventricular endocardial borders. Left ventricular diastolic parameters are consistent with Grade I diastolic dysfunction (impaired relaxation). Elevated left ventricular end-diastolic pressure. The E/e' is 67.  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 The mid and distal inferior wall and apical septal segment are hypokinetic. The mid inferoseptal segment is normal. The apex appears anerusymal. Right Ventricle: There is normal pulmonary artery systolic pressure. The tricuspid regurgitant velocity is 2.77 m/s, and with an assumed right atrial pressure of 3 mmHg, the estimated right ventricular systolic pressure is [id number] mmHg. Mitral Valve: The mitral valve is abnormal. Mild to moderate mitral annular calcification. Trivial mitral valve regurgitation. Tricuspid Valve: The tricuspid valve is grossly normal. Tricuspid valve regurgitation is mild. Aortic Valve: The aortic valve is calcified. There is severe calcifcation of the aortic valve. Severe aortic stenosis is present. Aortic valve mean gradient measures 36.0 mmHg. Aortic valve peak gradient measures 51.0 mmHg. Aortic valve area, by VTI measures 0.48 cm. Venous: The inferior vena cava is normal in size with greater than 50% respiratory variability, suggesting right atrial pressure of 3 mmHg. LEFT VENTRICLE PLAX 2D LVOT diam:     1.70 cm      Diastology LV SV:         43           LV e' medial:    5.66 cm/s LV SV Index:   20           LV E/e' medial:  19.1 LVOT Area:     2.27 cm     LV e' lateral:   6.96  cm/s                             LV E/e' lateral: 15.5  LV Volumes (MOD) LV vol d, MOD A2C: 85.3 ml LV vol d, MOD A4C: 104.0 ml LV vol s, MOD A2C: 24.7 ml LV vol s, MOD A4C: 28.7 ml LV SV MOD A2C:     60.6 ml LV SV MOD A4C:     104.0 ml LV SV MOD BP:      68.3 ml AORTIC VALVE AV Area (Vmax):    0.47 cm AV Area (Vmean):   0.46 cm AV Area (VTI):     0.48 cm AV Vmax:           357.00 cm/s AV Vmean:          292.000 cm/s AV VTI:            0.898 m AV Peak Grad:      51.0 mmHg AV Mean Grad:      36.0 mmHg LVOT Vmax:         74.20 cm/s LVOT Vmean:  59.800 cm/s LVOT VTI:          0.189 m LVOT/AV VTI ratio: 0.21 MITRAL VALVE                TRICUSPID VALVE MV Area (PHT): 3.40 cm     TR Peak grad:   30.7 mmHg MV Decel Time: 223 msec     TR Vmax:        277.00 cm/s MV E velocity: 108.00 cm/s MV A velocity: 114.00 cm/s  SHUNTS MV E/A ratio:  0.95         Systemic VTI:  0.19 m                             Systemic Diam: 1.70 cm Lyman Bishop MD Electronically signed by Lyman Bishop MD Signature Date/Time: 06/03/2022/10:39:17 AM    Final    CT Angio Chest/Abd/Pel for Dissection W and/or Wo Contrast  Result Date: 06/02/2022 CLINICAL DATA:  76 year old male with chest and abdominal pain, concern for acute aortic syndrome. EXAM: CT ANGIOGRAPHY CHEST, ABDOMEN AND PELVIS TECHNIQUE: Non-contrast CT of the chest was initially obtained. Multidetector CT imaging through the chest, abdomen and pelvis was performed using the standard protocol during bolus administration of intravenous contrast. Multiplanar reconstructed images and MIPs were obtained and reviewed to evaluate the vascular anatomy. RADIATION DOSE REDUCTION: This exam was performed according to the departmental dose-optimization program which includes automated exposure control, adjustment of the mA and/or kV according to patient size and/or use of iterative reconstruction technique. CONTRAST:  149mL OMNIPAQUE IOHEXOL 350 MG/ML SOLN COMPARISON:  Chest CT from  02/21/2022, chest abdomen pelvis from 11/09/2018 FINDINGS: CTA CHEST FINDINGS VASCULAR Preferential opacification of the thoracic aorta. No evidence of thoracic aortic aneurysm or dissection. Diffuse circumferential coronary atherosclerotic calcifications about the aortic arch and descending thoracic aorta. Normal heart size. No pericardial effusion. Sinues of Valsalva: 31 mm 32 x 31 mm ,unchanged Sinotubular Junction: 32 mm ,unchanged Ascending Aorta: 37 mm ,unchanged Aortic Arch: Fusiform aneurysmal dilation of zone 2 measuring up to 39 mm ,unchanged Descending aorta: 28 mm at the level of the carina ,unchanged Branch vessels: Conventional branching pattern. Ostial atherosclerotic changes without evidence of flow-limiting stenosis. Coronary arteries: Normal origins and courses. Severe atherosclerotic calcifications. Main pulmonary artery: 33 mm ,unchanged. No evidence of central pulmonary embolism. Pulmonary veins: No anomalous pulmonary venous return. No evidence of left atrial appendage thrombus. NON VASCULAR Mediastinum/Nodes: Interval enlargement of previously visualized prominent mediastinal lymph nodes, the largest in the aortopulmonic window measuring up to 1.7 cm in short axis, previously 1.1 cm. Lungs/Pleura: Severe upper lobe predominant centrilobular emphysema. Diffuse bronchial wall thickening. Interval development trace, right greater than pleural effusions. Bibasilar subsegmental atelectasis. Musculoskeletal: No chest wall abnormality. No acute or significant osseous findings. Review of the MIP images confirms the above findings. CTA ABDOMEN AND PELVIS FINDINGS VASCULAR Aorta: Circumferential fibrofatty and calcific atherosclerotic about the suprarenal abdominal aorta which is normal in caliber and patent. 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 Celiac: Mild ostial stenosis, likely secondary to median arcuate ligament compression. Patent distally. SMA: Moderate proximal stenosis  secondary to atherosclerotic plaque. Patent distally. Renals: Single bilateral renal arteries with moderate ostial stenosis secondary to atherosclerotic plaque. IMA: Occluded. Inflow: Similar appearing total occlusion of the common and external iliac arteries which reconstitute distally via the inferior epigastric arteries. Internal iliac reconstitution via prominent arc of [place]. Veins: Duplicated IVC. Limited evaluation of abdominopelvic venous patency secondary to  arterial phase image acquisition Review of the MIP images confirms the above findings. NON-VASCULAR Hepatobiliary: Unchanged 1.5 cm right lobe posterior simple hepatic cysts. The liver is otherwise normal in size, contour, and attenuation. The gallbladder is present with a single punctate partially calcified gallstone near the infundibulum, no surrounding inflammatory changes. No intra or extrahepatic biliary ductal dilation. Pancreas: Unremarkable. No pancreatic ductal dilatation or surrounding inflammatory changes. Spleen: Normal in size without focal abnormality. Adrenals/Urinary Tract: Adrenal glands are unremarkable. Unchanged appearance of multifocal bilateral simple appearing renal cysts. Kidneys are otherwise normal, without renal calculi, focal lesion, or hydronephrosis. Bladder is unremarkable. Stomach/Bowel: Stomach is within normal limits. Appendix is not definitively identified. Redundant sigmoid colon. No evidence of bowel wall thickening, distention, or inflammatory changes. Lymphatic: No abdominopelvic lymphadenopathy. Reproductive: The prostate measures up to 6.5 cm in greatest axial dimension, previously 5.6 cm. Other: No abdominal wall hernia or abnormality. No abdominopelvic ascites. Musculoskeletal: No acute or significant osseous findings. IMPRESSION: VASCULAR 1. No evidence of acute aortic syndrome. 2. 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 3.  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 Recommend annual imaging followup by CTA or MRA. This recommendation follows 2010 ACCF/AHA/AATS/ACR/ASA/SCA/SCAI/SIR/STS/SVM Guidelines for the Diagnosis and Management of Patients with Thoracic Aortic Disease. Circulation.2010; 121: Z610-R604. Aortic aneurysm NOS (ICD10-I71.9) 4.  Coronary and aortic Atherosclerosis (ICD10-I70.0). NON-VASCULAR 1. Interval enlargement of mediastinal lymphadenopathy of indeterminate significance. 2. Trace right greater than left pleural effusions. 3. Similar appearing severe emphysema (ICD10-J43.9). 4. Prostatomegaly. Marliss Coots, MD [facility] Electronically Signed   By: Marliss Coots M.D.   On: 06/02/2022 13:48    Cardiac Studies   Echo as outlined above  Patient Profile     76 y.o. male with multiple cardiovascular risk factors presenting with chest discomfort with both typical and atypical features, elevated troponin, and symptoms of heart failure  Assessment & Plan    1.  Non-STEMI: High-sensitivity troponin peaked at 359.  Patient treated with IV heparin.  Echo is reviewed and he had hyperdynamic LV function with no regional wall motion abnormalities just a few months ago, now has inferior and apical wall motion abnormalities with reduced LVEF of 50 to 55%.  Considering his risk profile, new wall motion abnormalities, chest pain, and elevated troponin, he meets indication for cardiac catheterization and possible PCI.  His left radial pulses stronger than the right and that may be his preferred access site.  I reviewed risks, indications, and alternatives to cardiac catheterization and possible PCI with the patient.  He is at high risk of multivessel CAD.  I do not think he would do well with CABG and hopefully if he has significant obstructive disease that will be amenable to PCI. I have reviewed the risks, indications, and alternatives  to cardiac catheterization, possible angioplasty, and stenting with the patient. Risks include but are not limited to bleeding, infection, vascular injury, stroke, myocardial infection, arrhythmia, kidney injury, radiation-related injury in the case of prolonged fluoroscopy use, emergency cardiac surgery, and death. The patient understands the risks of serious complication is 1-2 in 1000 with diagnostic cardiac cath and 1-2% or less with angioplasty/stenting.   2.  Acute heart failure with mildly reduced LV systolic function: Treated with IV diuretic therapy, symptoms seem to be improved.  Continue current management.  Patient now on Jardiance, metoprolol succinate, and spironolactone 3.  Hypertension: Initially uncontrolled but now improved on current  medications 4.  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  INR today is 1.6.      For questions or updates, please contact [facility] Please consult [email or web address] for contact info under        Signed, Tonny Bollman, MD  06/04/2022, 12:21 PM

## 2022-06-04 NOTE — Progress Notes (Signed)
Progress Note  Patient Name: Tony Zhang Date of Encounter: 06/04/2022  [facility] Cardiologist: Sanda Klein, MD   Subjective   Still with some pleuritic type chest discomfort.  Breathing is improved.  Discomfort is in the center of the chest on the left lateral chest areas.  Inpatient Medications    Scheduled Meds:  allopurinol  300 mg Oral QHS   atorvastatin  40 mg Oral Daily   cilostazol  100 mg Oral BID   empagliflozin  10 mg Oral Daily   famotidine  20 mg Oral Daily   fenofibrate  160 mg Oral Daily   metoprolol succinate  100 mg Oral Daily   pantoprazole  80 mg Oral Daily   sodium chloride flush  3 mL Intravenous Q12H   spironolactone  25 mg Oral Daily   tamsulosin  0.4 mg Oral QPM   warfarin  5 mg Oral ONCE-1600   Warfarin - Pharmacist Dosing Inpatient   Does not apply q1600   Continuous Infusions:  sodium chloride     heparin 2,000 Units/hr (06/04/22 1021)   PRN Meds: sodium chloride, acetaminophen **OR** acetaminophen, albuterol, cyclobenzaprine, hydrALAZINE, morphine injection, ondansetron **OR** ondansetron (ZOFRAN) IV, oxyCODONE, sodium chloride flush   Vital Signs    Vitals:   06/03/22 1554 06/03/22 1959 06/04/22 0443 06/04/22 0904  BP: (!) 157/75 (!) 121/58 135/60 (!) 167/71  Pulse: 65 67 68 72  Resp: 17 14 20 17   Temp: 97.8 F (36.6 C) 98.4 F (36.9 C) 98.5 F (36.9 C) 98.3 F (36.8 C)  TempSrc: Oral  Oral Oral  SpO2: 95% 93% 98% 94%  Weight:      Height:        Intake/Output Summary (Last 24 hours) at 06/04/2022 1221 Last data filed at 06/04/2022 0842 Gross per 24 hour  Intake 902.94 ml  Output 875 ml  Net 27.94 ml      06/03/2022   11:00 AM 02/21/2022    7:26 PM 09/13/2020    6:47 AM  Last 3 Weights  Weight (lbs) 206 lb 206 lb 231 lb  Weight (kg) 93.441 kg 93.441 kg 104.781 kg      Telemetry    Sinus rhythm without significant arrhythmia- Personally Reviewed  ECG    Normal sinus rhythm with anteroseptal infarct and  inferior infarct, both age undetermined.  No significant changes from previous EKG tracing. ``- Personally Reviewed  Physical Exam  Alert, oriented, no distress GEN: No acute distress.   Neck: No JVD Cardiac: RRR, no murmurs, rubs, or gallops.  Respiratory: Clear to auscultation bilaterally. GI: Soft, nontender, non-distended  MS: No edema; No deformity.  Left radial pulse 2+, right radial pulse 1+ Neuro:  Nonfocal  Psych: Normal affect   Labs    High Sensitivity Troponin:   Recent Labs  Lab 06/02/22 0752 06/02/22 0953 06/02/22 1530 06/03/22 0544  TROPONINIHS 37* 157* 359* 307*     Chemistry Recent Labs  Lab 06/02/22 1215 06/02/22 1235 06/03/22 0257 06/04/22 0223  NA 136 135 133* 135  K 4.1 5.1 4.1 3.7  CL 105  --  103 102  CO2 17*  --  20* 23  GLUCOSE 151*  --  149* 104*  BUN 13  --  20 27*  CREATININE 0.96  --  1.22 1.28*  CALCIUM 8.9  --  8.7* 8.6*  PROT 7.4  --   --   --   ALBUMIN 3.6  --   --   --   AST  16  --   --   --   ALT 10  --   --   --   ALKPHOS 88  --   --   --   BILITOT 1.6*  --   --   --   GFRNONAA >60  --  >60 58*  ANIONGAP 14  --  10 10    Lipids  Recent Labs  Lab 06/03/22 0257  CHOL 140  TRIG 97  HDL 36*  LDLCALC 85  CHOLHDL 3.9    Hematology Recent Labs  Lab 06/02/22 0752 06/02/22 1235 06/03/22 0257 06/04/22 0223  WBC 11.2*  --  9.3 10.2  RBC 4.97  --  4.81 4.57  HGB 15.9 17.7* 15.5 14.6  HCT 48.8 52.0 46.4 45.6  MCV 98.2  --  96.5 99.8  MCH 32.0  --  32.2 31.9  MCHC 32.6  --  33.4 32.0  RDW 15.5  --  15.1 15.4  PLT 259  --  255 247   Thyroid  Recent Labs  Lab 06/02/22 2133  TSH 0.818    BNP Recent Labs  Lab 06/02/22 0953  BNP 1,245.2*    DDimer No results for input(s): "DDIMER" in the last 168 hours.   Radiology    ECHOCARDIOGRAM LIMITED  Result Date: 06/03/2022    ECHOCARDIOGRAM LIMITED REPORT   Patient Name:   Tony Zhang Date of Exam: 06/03/2022 Medical Rec #:  [id number]     Height:       73.0 in  Accession #:    [id number]    Weight:       206.0 lb Date of Birth:  1946-09-13     BSA:          2.179 m Patient Age:    76 years      BP:           166/74 mmHg Patient Gender: M             HR:           72 bpm. Exam Location:  Inpatient Procedure: Limited Echo, Cardiac Doppler, Color Doppler and Intracardiac            Opacification Agent Indications:    Chest Pain R07.9  History:        Patient has prior history of Echocardiogram examinations, most                 recent 02/22/2022. COPD and PAD, Aortic Valve Disease; Risk                 Factors:Former Smoker and Hypertension. AAA.  Sonographer:    Darlina Sicilian RDCS Referring Phys: [place]  Sonographer Comments: Image acquisition and PEDOF windows challenging due to respiratory motion. IMPRESSIONS  1. Left ventricular ejection fraction, by estimation, is 50 to 55%. The left ventricle has low normal function. The left ventricle demonstrates regional wall motion abnormalities (see scoring diagram/findings for description). Left ventricular diastolic  parameters are consistent with Grade I diastolic dysfunction (impaired relaxation). Elevated left ventricular end-diastolic pressure. The E/e' is 77. There is severe hypokinesis of the left ventricular, mid-apical inferior wall, inferoseptal wall and apical segment. The apex appears anerusymal.  2. The mitral valve is abnormal. Trivial mitral valve regurgitation.  3. The aortic valve is calcified. There is severe calcifcation of the aortic valve. Moderate to severe aortic valve stenosis. Aortic valve area, by VTI measures 0.48 cm. Aortic valve mean gradient measures 36.0 mmHg. Aortic  valve Vmax measures 3.57 m/s. Peak gradient 51 mmHg, DI 0.21.  4. There is normal pulmonary artery systolic pressure.  5. The inferior vena cava is normal in size with greater than 50% respiratory variability, suggesting right atrial pressure of 3 mmHg. Comparison(s): Prior images reviewed side by side. Changes from prior  study are noted. 02/22/2022: LVEF >75%, mild (probably moderate) AS - mean gradient 16 mmHg, DI 0.33. FINDINGS  Left Ventricle: Left ventricular ejection fraction, by estimation, is 50 to 55%. The left ventricle has low normal function. The left ventricle demonstrates regional wall motion abnormalities. Severe hypokinesis of the left ventricular, mid-apical inferior wall, inferoseptal wall and apical segment. Definity contrast agent was given IV to delineate the left ventricular endocardial borders. Left ventricular diastolic parameters are consistent with Grade I diastolic dysfunction (impaired relaxation). Elevated left ventricular end-diastolic pressure. The E/e' is 4.  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 The mid and distal inferior wall and apical septal segment are hypokinetic. The mid inferoseptal segment is normal. The apex appears anerusymal. Right Ventricle: There is normal pulmonary artery systolic pressure. The tricuspid regurgitant velocity is 2.77 m/s, and with an assumed right atrial pressure of 3 mmHg, the estimated right ventricular systolic pressure is [id number] mmHg. Mitral Valve: The mitral valve is abnormal. Mild to moderate mitral annular calcification. Trivial mitral valve regurgitation. Tricuspid Valve: The tricuspid valve is grossly normal. Tricuspid valve regurgitation is mild. Aortic Valve: The aortic valve is calcified. There is severe calcifcation of the aortic valve. Severe aortic stenosis is present. Aortic valve mean gradient measures 36.0 mmHg. Aortic valve peak gradient measures 51.0 mmHg. Aortic valve area, by VTI measures 0.48 cm. Venous: The inferior vena cava is normal in size with greater than 50% respiratory variability, suggesting right atrial pressure of 3 mmHg. LEFT VENTRICLE PLAX 2D LVOT diam:     1.70 cm      Diastology LV SV:         43           LV e' medial:    5.66 cm/s LV SV Index:   20           LV E/e' medial:  19.1 LVOT Area:     2.27 cm     LV e' lateral:   6.96  cm/s                             LV E/e' lateral: 15.5  LV Volumes (MOD) LV vol d, MOD A2C: 85.3 ml LV vol d, MOD A4C: 104.0 ml LV vol s, MOD A2C: 24.7 ml LV vol s, MOD A4C: 28.7 ml LV SV MOD A2C:     60.6 ml LV SV MOD A4C:     104.0 ml LV SV MOD BP:      68.3 ml AORTIC VALVE AV Area (Vmax):    0.47 cm AV Area (Vmean):   0.46 cm AV Area (VTI):     0.48 cm AV Vmax:           357.00 cm/s AV Vmean:          292.000 cm/s AV VTI:            0.898 m AV Peak Grad:      51.0 mmHg AV Mean Grad:      36.0 mmHg LVOT Vmax:         74.20 cm/s LVOT Vmean:  59.800 cm/s LVOT VTI:          0.189 m LVOT/AV VTI ratio: 0.21 MITRAL VALVE                TRICUSPID VALVE MV Area (PHT): 3.40 cm     TR Peak grad:   30.7 mmHg MV Decel Time: 223 msec     TR Vmax:        277.00 cm/s MV E velocity: 108.00 cm/s MV A velocity: 114.00 cm/s  SHUNTS MV E/A ratio:  0.95         Systemic VTI:  0.19 m                             Systemic Diam: 1.70 cm Lyman Bishop MD Electronically signed by Lyman Bishop MD Signature Date/Time: 06/03/2022/10:39:17 AM    Final    CT Angio Chest/Abd/Pel for Dissection W and/or Wo Contrast  Result Date: 06/02/2022 CLINICAL DATA:  76 year old male with chest and abdominal pain, concern for acute aortic syndrome. EXAM: CT ANGIOGRAPHY CHEST, ABDOMEN AND PELVIS TECHNIQUE: Non-contrast CT of the chest was initially obtained. Multidetector CT imaging through the chest, abdomen and pelvis was performed using the standard protocol during bolus administration of intravenous contrast. Multiplanar reconstructed images and MIPs were obtained and reviewed to evaluate the vascular anatomy. RADIATION DOSE REDUCTION: This exam was performed according to the departmental dose-optimization program which includes automated exposure control, adjustment of the mA and/or kV according to patient size and/or use of iterative reconstruction technique. CONTRAST:  141mL OMNIPAQUE IOHEXOL 350 MG/ML SOLN COMPARISON:  Chest CT from  02/21/2022, chest abdomen pelvis from 11/09/2018 FINDINGS: CTA CHEST FINDINGS VASCULAR Preferential opacification of the thoracic aorta. No evidence of thoracic aortic aneurysm or dissection. Diffuse circumferential coronary atherosclerotic calcifications about the aortic arch and descending thoracic aorta. Normal heart size. No pericardial effusion. Sinues of Valsalva: 31 mm 32 x 31 mm ,unchanged Sinotubular Junction: 32 mm ,unchanged Ascending Aorta: 37 mm ,unchanged Aortic Arch: Fusiform aneurysmal dilation of zone 2 measuring up to 39 mm ,unchanged Descending aorta: 28 mm at the level of the carina ,unchanged Branch vessels: Conventional branching pattern. Ostial atherosclerotic changes without evidence of flow-limiting stenosis. Coronary arteries: Normal origins and courses. Severe atherosclerotic calcifications. Main pulmonary artery: 33 mm ,unchanged. No evidence of central pulmonary embolism. Pulmonary veins: No anomalous pulmonary venous return. No evidence of left atrial appendage thrombus. NON VASCULAR Mediastinum/Nodes: Interval enlargement of previously visualized prominent mediastinal lymph nodes, the largest in the aortopulmonic window measuring up to 1.7 cm in short axis, previously 1.1 cm. Lungs/Pleura: Severe upper lobe predominant centrilobular emphysema. Diffuse bronchial wall thickening. Interval development trace, right greater than pleural effusions. Bibasilar subsegmental atelectasis. Musculoskeletal: No chest wall abnormality. No acute or significant osseous findings. Review of the MIP images confirms the above findings. CTA ABDOMEN AND PELVIS FINDINGS VASCULAR Aorta: Circumferential fibrofatty and calcific atherosclerotic about the suprarenal abdominal aorta which is normal in caliber and patent. 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 Celiac: Mild ostial stenosis, likely secondary to median arcuate ligament compression. Patent distally. SMA: Moderate proximal stenosis  secondary to atherosclerotic plaque. Patent distally. Renals: Single bilateral renal arteries with moderate ostial stenosis secondary to atherosclerotic plaque. IMA: Occluded. Inflow: Similar appearing total occlusion of the common and external iliac arteries which reconstitute distally via the inferior epigastric arteries. Internal iliac reconstitution via prominent arc of [place]. Veins: Duplicated IVC. Limited evaluation of abdominopelvic venous patency secondary to  arterial phase image acquisition Review of the MIP images confirms the above findings. NON-VASCULAR Hepatobiliary: Unchanged 1.5 cm right lobe posterior simple hepatic cysts. The liver is otherwise normal in size, contour, and attenuation. The gallbladder is present with a single punctate partially calcified gallstone near the infundibulum, no surrounding inflammatory changes. No intra or extrahepatic biliary ductal dilation. Pancreas: Unremarkable. No pancreatic ductal dilatation or surrounding inflammatory changes. Spleen: Normal in size without focal abnormality. Adrenals/Urinary Tract: Adrenal glands are unremarkable. Unchanged appearance of multifocal bilateral simple appearing renal cysts. Kidneys are otherwise normal, without renal calculi, focal lesion, or hydronephrosis. Bladder is unremarkable. Stomach/Bowel: Stomach is within normal limits. Appendix is not definitively identified. Redundant sigmoid colon. No evidence of bowel wall thickening, distention, or inflammatory changes. Lymphatic: No abdominopelvic lymphadenopathy. Reproductive: The prostate measures up to 6.5 cm in greatest axial dimension, previously 5.6 cm. Other: No abdominal wall hernia or abnormality. No abdominopelvic ascites. Musculoskeletal: No acute or significant osseous findings. IMPRESSION: VASCULAR 1. No evidence of acute aortic syndrome. 2. 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 3.  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 Recommend annual imaging followup by CTA or MRA. This recommendation follows 2010 ACCF/AHA/AATS/ACR/ASA/SCA/SCAI/SIR/STS/SVM Guidelines for the Diagnosis and Management of Patients with Thoracic Aortic Disease. Circulation.2010; 121: Z610-R604. Aortic aneurysm NOS (ICD10-I71.9) 4.  Coronary and aortic Atherosclerosis (ICD10-I70.0). NON-VASCULAR 1. Interval enlargement of mediastinal lymphadenopathy of indeterminate significance. 2. Trace right greater than left pleural effusions. 3. Similar appearing severe emphysema (ICD10-J43.9). 4. Prostatomegaly. Marliss Coots, MD [facility] Electronically Signed   By: Marliss Coots M.D.   On: 06/02/2022 13:48    Cardiac Studies   Echo as outlined above  Patient Profile     76 y.o. male with multiple cardiovascular risk factors presenting with chest discomfort with both typical and atypical features, elevated troponin, and symptoms of heart failure  Assessment & Plan    1.  Non-STEMI: High-sensitivity troponin peaked at 359.  Patient treated with IV heparin.  Echo is reviewed and he had hyperdynamic LV function with no regional wall motion abnormalities just a few months ago, now has inferior and apical wall motion abnormalities with reduced LVEF of 50 to 55%.  Considering his risk profile, new wall motion abnormalities, chest pain, and elevated troponin, he meets indication for cardiac catheterization and possible PCI.  His left radial pulses stronger than the right and that may be his preferred access site.  I reviewed risks, indications, and alternatives to cardiac catheterization and possible PCI with the patient.  He is at high risk of multivessel CAD.  I do not think he would do well with CABG and hopefully if he has significant obstructive disease that will be amenable to PCI. I have reviewed the risks, indications, and alternatives  to cardiac catheterization, possible angioplasty, and stenting with the patient. Risks include but are not limited to bleeding, infection, vascular injury, stroke, myocardial infection, arrhythmia, kidney injury, radiation-related injury in the case of prolonged fluoroscopy use, emergency cardiac surgery, and death. The patient understands the risks of serious complication is 1-2 in 1000 with diagnostic cardiac cath and 1-2% or less with angioplasty/stenting.   2.  Acute heart failure with mildly reduced LV systolic function: Treated with IV diuretic therapy, symptoms seem to be improved.  Continue current management.  Patient now on Jardiance, metoprolol succinate, and spironolactone 3.  Hypertension: Initially uncontrolled but now improved on current  medications 4.  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  INR today is 1.6.      For questions or updates, please contact [facility] Please consult [email or web address] for contact info under        Signed, Tonny Bollman, MD  06/04/2022, 12:21 PM

## 2022-06-04 NOTE — Interval H&P Note (Signed)
Cath Lab Visit (complete for each Cath Lab visit)  Clinical Evaluation Leading to the Procedure:   ACS: Yes.    Non-ACS:    Anginal Classification: CCS IV  Anti-ischemic medical therapy: Minimal Therapy (1 class of medications)  Non-Invasive Test Results: No non-invasive testing performed  Prior CABG: No previous CABG      History and Physical Interval Note:  06/04/2022 4:28 PM  Tony Zhang  has presented today for surgery, with the diagnosis of nstemi.  The various methods of treatment have been discussed with the patient and family. After consideration of risks, benefits and other options for treatment, the patient has consented to  Procedure(s): LEFT HEART CATH AND CORONARY ANGIOGRAPHY (N/A) as a surgical intervention.  The patient's history has been reviewed, patient examined, no change in status, stable for surgery.  I have reviewed the patient's chart and labs.  Questions were answered to the patient's satisfaction.     Lance Muss

## 2022-06-04 NOTE — Progress Notes (Signed)
Heart Failure Nurse Navigator Progress Note  PCP: [name], MD PCP-Cardiologist: [place] Admission Diagnosis: chest pain, Hypertension Admitted from: Home via [facility]  Presentation:   Tony Zhang presented with chest pain and shortness of breath, abdominal pain in right side. BP 188/107, HR 78, BNP 1,245, troponin 359, IV morphine and hydralizine given, placed on 2 L oxygen. Heparin gtt started. CXR showed increased bibasilar edema, CTA negative for PE or dissection, ECHO EF 50% G1DD and moderate to severe aortic stenosis.   Patient was verbally educated on the sign and symptoms of heart failure, daily weights, when to call his doctor or go to the ER. Diet/ fluid restrictions, taking all medications as prescribed and attending all doctor appointments, he verbalized his understanding. A hospital follow up wit HF TOC is scheduled for 06/18/2022 @ 10 am per Dr. Jomarie Longs.   ECHO/ LVEF: 50%  Clinical Course:  Past Medical History:  Diagnosis Date   AAA (abdominal aortic aneurysm) (HCC)    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     COPD (chronic obstructive pulmonary disease) (HCC)    DJD (degenerative joint disease)    Dyslipidemia    Gout    Malignant hypertension    Peripheral vascular disease (HCC)    Pneumonia 08/2013   pt. reports that he is having this surgery 11/25/2014- for the lung problem that began with pneumonia in 09/2014   Shortness of breath    Venous thrombosis    Recurrent     Social History   Socioeconomic History   Marital status: Widowed    Spouse name: Not on file   Number of children: 0   Years of education: Not on file   Highest education level: 6th grade  Occupational History    Comment: semi retired  Tobacco Use   Smoking status: Former    Types: Cigarettes    Quit date: 10/18/1992    Years since quitting: 29.6   Smokeless tobacco: Never  Vaping Use   Vaping Use: Never used   Substance and Sexual Activity   Alcohol use: No   Drug use: No   Sexual activity: Not on file  Other Topics Concern   Not on file  Social History Narrative   Not on file   Social Determinants of Health   Financial Resource Strain: Low Risk  (06/04/2022)   Overall Financial Resource Strain (CARDIA)    Difficulty of Paying Living Expenses: Not hard at all  Food Insecurity: No Food Insecurity (06/04/2022)   Hunger Vital Sign    Worried About Running Out of Food in the Last Year: Never true    Ran Out of Food in the Last Year: Never true  Transportation Needs: No Transportation Needs (06/04/2022)   PRAPARE - Administrator, Civil Service (Medical): No    Lack of Transportation (Non-Medical): No  Physical Activity: Not on file  Stress: Not on file  Social Connections: Not on file   Education Assessment and Provision:  Detailed education and instructions provided on heart failure disease management including the following:  Signs and symptoms of Heart Failure When to call the physician Importance of daily weights Low sodium diet Fluid restriction Medication management Anticipated future follow-up appointments  Patient education given on each of the above topics.  Patient acknowledges understanding via teach back method and acceptance of all instructions.  Education Materials:  "Living Better With Heart Failure"  Booklet, HF zone tool, & Daily Weight Tracker Tool.  Patient has scale at home: yes Patient has pill box at home: NA     High Risk Criteria for Readmission and/or Poor Patient Outcomes: Heart failure hospital admissions (last 6 months): 0  No Show rate: 2% Difficult social situation: No Demonstrates medication adherence: Yes Primary Language: English Literacy level: 6th grade level with dyslexia, can read and write "a little bit" per patient  Barriers of Care:   Diet/ fluid restriction Daily weights HF education  continued  Considerations/Referrals:   Referral made to Heart Failure Pharmacist Stewardship: yes Referral made to Heart Failure CSW/NCM TOC: No Referral made to Heart & Vascular TOC clinic: Yes 06/18/22 @ 10 am per Dr. Jomarie Longs  Items for Follow-up on DC/TOC: Diet/ fluid restrictions Daily weights Continued HF education   Rhae Hammock, BSN, RN Heart Failure Print production planner Chat Only

## 2022-06-05 DIAGNOSIS — I214 Non-ST elevation (NSTEMI) myocardial infarction: Secondary | ICD-10-CM | POA: Diagnosis not present

## 2022-06-05 LAB — BASIC METABOLIC PANEL
Anion gap: 11 (ref 5–15)
BUN: 28 mg/dL — ABNORMAL HIGH (ref 8–23)
CO2: 23 mmol/L (ref 22–32)
Calcium: 8.7 mg/dL — ABNORMAL LOW (ref 8.9–10.3)
Chloride: 103 mmol/L (ref 98–111)
Creatinine, Ser: 1.32 mg/dL — ABNORMAL HIGH (ref 0.61–1.24)
GFR, Estimated: 56 mL/min — ABNORMAL LOW (ref >60)
Glucose, Bld: 107 mg/dL — ABNORMAL HIGH (ref 70–99)
Potassium: 4.3 mmol/L (ref 3.5–5.1)
Sodium: 137 mmol/L (ref 135–145)

## 2022-06-05 LAB — CBC
HCT: 45.6 % (ref 39.0–52.0)
Hemoglobin: 14.9 g/dL (ref 13.0–17.0)
MCH: 31.8 pg (ref 26.0–34.0)
MCHC: 32.7 g/dL (ref 30.0–36.0)
MCV: 97.2 fL (ref 80.0–100.0)
Platelets: 239 10*3/uL (ref 150–400)
RBC: 4.69 MIL/uL (ref 4.22–5.81)
RDW: 15.2 % (ref 11.5–15.5)
WBC: 8.3 10*3/uL (ref 4.0–10.5)
nRBC: 0 % (ref 0.0–0.2)

## 2022-06-05 LAB — HEPARIN LEVEL (UNFRACTIONATED)
Heparin Unfractionated: <0.1 IU/mL — ABNORMAL LOW (ref 0.30–0.70)
Heparin Unfractionated: 0.16 IU/mL — ABNORMAL LOW (ref 0.30–0.70)

## 2022-06-05 LAB — PROTIME-INR
INR: 1.8 — ABNORMAL HIGH (ref 0.8–1.2)
Prothrombin Time: 21.1 seconds — ABNORMAL HIGH (ref 11.4–15.2)

## 2022-06-05 MED ORDER — WARFARIN SODIUM 5 MG PO TABS
5.0000 mg | ORAL_TABLET | Freq: Once | ORAL | Status: AC
  Administered 2022-06-05: 5 mg via ORAL
  Filled 2022-06-05: qty 1

## 2022-06-05 MED ORDER — FUROSEMIDE 40 MG PO TABS
40.0000 mg | ORAL_TABLET | Freq: Every day | ORAL | Status: DC
  Administered 2022-06-05: 40 mg via ORAL
  Filled 2022-06-05 (×2): qty 1

## 2022-06-05 MED ORDER — ISOSORBIDE MONONITRATE ER 30 MG PO TB24: 15.0000 mg | ORAL_TABLET | Freq: Every day | ORAL | Status: DC

## 2022-06-05 MED ORDER — ISOSORBIDE MONONITRATE ER 30 MG PO TB24
30.0000 mg | ORAL_TABLET | Freq: Every day | ORAL | Status: DC
  Administered 2022-06-05: 30 mg via ORAL
  Filled 2022-06-05: qty 1

## 2022-06-05 MED ORDER — HEPARIN BOLUS VIA INFUSION
2800.0000 [IU] | Freq: Once | INTRAVENOUS | Status: AC
Start: 2022-06-05 — End: 2022-06-05
  Administered 2022-06-05: 2800 [IU] via INTRAVENOUS
  Filled 2022-06-05: qty 2800

## 2022-06-05 NOTE — Progress Notes (Addendum)
ANTICOAGULATION CONSULT NOTE- follow-up  Pharmacy Consult for warfarin with heparin bridge Indication: Hx DVT/PE   Allergies  Allergen Reactions   Codeine Hives and Other (See Comments)    Takes benadryl to stop allergic reactions    Patient Measurements: Height: 6\' 1"  (185.4 cm) Weight: 93.4 kg (206 lb) IBW/kg (Calculated) : 79.9 Heparin dosing weight 93.4 kg  Vital Signs: Temp: 97.8 F (36.6 C) (08/19 0752) Temp Source: Oral (08/19 0752) BP: 179/70 (08/19 1002) Pulse Rate: 79 (08/19 1002)  Labs: Recent Labs     0000 06/02/22 1530 06/03/22 0257 06/03/22 0544 06/03/22 1054 06/03/22 2008 06/04/22 0223 06/05/22 0254 06/05/22 0952  HGB   < >  --  15.5  --   --   --  14.6 14.9  --   HCT  --   --  46.4  --   --   --  45.6 45.6  --   PLT  --   --  255  --   --   --  247 239  --   LABPROT  --   --  15.8*  --   --   --  18.6* 21.1*  --   INR  --   --  1.3*  --   --   --  1.6* 1.8*  --   HEPARINUNFRC  --   --  0.20*  --    < > 0.40 0.46  --  <0.10*  CREATININE  --   --  1.22  --   --   --  1.28* 1.32*  --   TROPONINIHS  --  359*  --  307*  --   --   --   --   --    < > = values in this interval not displayed.     Estimated Creatinine Clearance: 53.8 mL/min (A) (by C-G formula based on SCr of 1.32 mg/dL (H)).  Assessment: 75 YOM presenting with SOB, hx DVT on warfarin PTA, last dose 8/15 and INR on admission is 1.2. PTA dosing: 5mg  daily except Mon and Fri no doses  Patient is now s/p 2x doses of 7.5 mg and 1x dose of 5 mg warfarin. Hgb stable and wnl at 14.9, plts wnl at 239. Eating 75-100% of meals.   Heparin level subtherapeutic at <0.1. INR increased but remains sub-therapeutic at 1.8.  No issues with infusion or bleeding per RN.  Goal of Therapy:  Heparin level 0.3-0.7 units/ml INR 2-3 Monitor platelets by anticoagulation protocol: Yes   Plan:  Re-bolus heparin 2800 units x1  Increase heparin to 1400 units/hr Warfarin 5 mg x1 tonight  Check 8hr heparin  level 8/19 @1930  Daily heparin level, INR and CBC Follow up signs of bleeding, clinical status   Wed, PharmD Pharmacy Resident  06/05/2022 12:43 PM ---------------------------------------------------------------------------------------------------------- I discussed / reviewed the pharmacy note by Dr. and I agree with the resident's findings and plans as documented.  Andreas Ohm BS, PharmD, BCPS Clinical Pharmacist 06/05/2022 1:32 PM  Contact: [phone] after 3 PM  "Be curious, not judgmental..." -Greta Doom ----------------------------------------------------------------------------------------------------------

## 2022-06-05 NOTE — Progress Notes (Signed)
Patient's O2 sats=86-88%on 1L while sleeping.  Woke patient, sats increased to 97% on 1L O2 [place]

## 2022-06-05 NOTE — Progress Notes (Signed)
ANTICOAGULATION CONSULT NOTE- follow-up  Pharmacy Consult for warfarin with heparin bridge Indication: Hx DVT/PE   Allergies  Allergen Reactions   Codeine Hives and Other (See Comments)    Takes benadryl to stop allergic reactions    Patient Measurements: Height: 6\' 1"  (185.4 cm) Weight: 93.4 kg (206 lb) IBW/kg (Calculated) : 79.9 Heparin dosing weight 93.4 kg  Vital Signs: Temp: 97.7 F (36.5 C) (08/19 1539) Temp Source: Oral (08/19 1539) BP: 102/53 (08/19 1539) Pulse Rate: 79 (08/19 1002)  Labs: Recent Labs    06/03/22 0257 06/03/22 0544 06/03/22 1054 06/04/22 0223 06/05/22 0254 06/05/22 0952 06/05/22 1924  HGB 15.5  --   --  14.6 14.9  --   --   HCT 46.4  --   --  45.6 45.6  --   --   PLT 255  --   --  247 239  --   --   LABPROT 15.8*  --   --  18.6* 21.1*  --   --   INR 1.3*  --   --  1.6* 1.8*  --   --   HEPARINUNFRC 0.20*  --    < > 0.46  --  <0.10* 0.16*  CREATININE 1.22  --   --  1.28* 1.32*  --   --   TROPONINIHS  --  307*  --   --   --   --   --    < > = values in this interval not displayed.     Estimated Creatinine Clearance: 53.8 mL/min (A) (by C-G formula based on SCr of 1.32 mg/dL (H)).  Assessment: 75 YOM presenting with SOB, hx DVT on warfarin PTA, last dose 8/15 and INR on admission is 1.2. PTA dosing: 5mg  daily except Mon and Fri no doses  Patient is now s/p 2x doses of 7.5 mg and 2x dose of 5 mg warfarin. Hgb stable and wnl at 14.9, plts wnl at 239. Eating 75-100% of meals.   Heparin level subtherapeutic at 0.16. INR increased but remains sub-therapeutic at 1.8.  No issues with infusion or bleeding documented.   Goal of Therapy:  Heparin level 0.3-0.7 units/ml INR 2-3 Monitor platelets by anticoagulation protocol: Yes   Plan:  Increase heparin to 1700 units/hr Warfarin 5 mg x1 tonight  Check 8hr heparin level 8/20 @ 0500 Daily heparin level, INR, and CBC Follow up signs of bleeding, clinical status    Fri, PharmD PGY1  Pharmacy Resident 8/19/20238:43 PM

## 2022-06-05 NOTE — Progress Notes (Signed)
Progress Note  Patient Name: Tony Zhang Date of Encounter: 06/05/2022  [facility] Cardiologist: Sanda Klein, MD   Subjective   No complaints  Inpatient Medications    Scheduled Meds:  allopurinol  300 mg Oral QHS   aspirin  81 mg Oral Daily   atorvastatin  40 mg Oral Daily   cilostazol  100 mg Oral BID   clopidogrel  75 mg Oral Daily   empagliflozin  10 mg Oral Daily   famotidine  20 mg Oral Daily   fenofibrate  160 mg Oral Daily   metoprolol succinate  100 mg Oral Daily   pantoprazole  80 mg Oral Daily   sodium chloride flush  3 mL Intravenous Q12H   sodium chloride flush  3 mL Intravenous Q12H   sodium chloride flush  3 mL Intravenous Q12H   spironolactone  25 mg Oral Daily   tamsulosin  0.4 mg Oral QPM   Warfarin - Pharmacist Dosing Inpatient   Does not apply q1600   Continuous Infusions:  sodium chloride     sodium chloride     heparin 1,100 Units/hr (06/05/22 0256)   PRN Meds: sodium chloride, sodium chloride, acetaminophen **OR** acetaminophen, albuterol, cyclobenzaprine, hydrALAZINE, morphine injection, ondansetron **OR** ondansetron (ZOFRAN) IV, oxyCODONE, sodium chloride flush, sodium chloride flush   Vital Signs    Vitals:   06/04/22 2043 06/04/22 2148 06/05/22 0452 06/05/22 0752  BP: (!) 144/64 (!) 108/50 (!) 117/51 (!) 123/44  Pulse: 68 68 70 70  Resp: 18 16 16 14   Temp: 97.9 F (36.6 C) 97.8 F (36.6 C) (!) 97.5 F (36.4 C) 97.8 F (36.6 C)  TempSrc: Oral Oral Oral Oral  SpO2:  94% 94% 96%  Weight:      Height:        Intake/Output Summary (Last 24 hours) at 06/05/2022 0928 Last data filed at 06/04/2022 1928 Gross per 24 hour  Intake --  Output 750 ml  Net -750 ml      06/03/2022   11:00 AM 02/21/2022    7:26 PM 09/13/2020    6:47 AM  Last 3 Weights  Weight (lbs) 206 lb 206 lb 231 lb  Weight (kg) 93.441 kg 93.441 kg 104.781 kg      Telemetry    SR - Personally Reviewed  ECG    N/a - Personally Reviewed  Physical  Exam   GEN: No acute distress.   Neck: No JVD Cardiac: RRR, 2/6 systlic murmur rusb Respiratory: Clear to auscultation bilaterally. GI: Soft, nontender, non-distended  MS: No edema; No deformity. Neuro:  Nonfocal  Psych: Normal affect   Labs    High Sensitivity Troponin:   Recent Labs  Lab 06/02/22 0752 06/02/22 0953 06/02/22 1530 06/03/22 0544  TROPONINIHS 37* 157* 359* 307*     Chemistry Recent Labs  Lab 06/02/22 1215 06/02/22 1235 06/03/22 0257 06/04/22 0223 06/05/22 0254  NA 136   < > 133* 135 137  K 4.1   < > 4.1 3.7 4.3  CL 105  --  103 102 103  CO2 17*  --  20* 23 23  GLUCOSE 151*  --  149* 104* 107*  BUN 13  --  20 27* 28*  CREATININE 0.96  --  1.22 1.28* 1.32*  CALCIUM 8.9  --  8.7* 8.6* 8.7*  PROT 7.4  --   --   --   --   ALBUMIN 3.6  --   --   --   --  AST 16  --   --   --   --   ALT 10  --   --   --   --   ALKPHOS 88  --   --   --   --   BILITOT 1.6*  --   --   --   --   GFRNONAA >60  --  >60 58* 56*  ANIONGAP 14  --  10 10 11    < > = values in this interval not displayed.    Lipids  Recent Labs  Lab 06/03/22 0257  CHOL 140  TRIG 97  HDL 36*  LDLCALC 85  CHOLHDL 3.9    Hematology Recent Labs  Lab 06/03/22 0257 06/04/22 0223 06/05/22 0254  WBC 9.3 10.2 8.3  RBC 4.81 4.57 4.69  HGB 15.5 14.6 14.9  HCT 46.4 45.6 45.6  MCV 96.5 99.8 97.2  MCH 32.2 31.9 31.8  MCHC 33.4 32.0 32.7  RDW 15.1 15.4 15.2  PLT 255 247 239   Thyroid  Recent Labs  Lab 06/02/22 2133  TSH 0.818    BNP Recent Labs  Lab 06/02/22 0953  BNP 1,245.2*    DDimer No results for input(s): "DDIMER" in the last 168 hours.   Radiology    CARDIAC CATHETERIZATION  Result Date: 06/04/2022   Ost RCA to Prox RCA lesion is 80% stenosed.  Heavily calcified.  Would require atherectomy to fix.   Prox RCA lesion is 25% stenosed.   Mid RCA lesion is 70% stenosed.  Diffuse disease between the ostial lesion and this lesion.   3rd Mrg lesion is 75% stenosed.   1st Diag  lesion is 50% stenosed.   Ost LAD to Mid LAD lesion is 25% stenosed.   Mid LAD lesion is 90% stenosed.   A drug-eluting stent was successfully placed using a SYNERGY XD 3.0X16, postdilated to greater than 3.5 mm.   Post intervention, there is a 0% residual stenosis.   The left ventricular systolic function is normal.   LV end diastolic pressure is normal.   The left ventricular ejection fraction is 50-55% by visual estimate.   There is no aortic valve stenosis. 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  There is moderate diffuse disease in the proximal LAD.  The culprit lesion was a severe stenosis in the mid LAD.  This was successfully treated with a 3.0 x 16 Synergy drug-eluting stent, postdilated to greater than 3.5 mm in diameter. Resume IV heparin 2 hours after the TR band is removed.  Coumadin can also be resumed tonight.  Target INR greater than 2.  Aspirin will be used for 1 month and then stop.  Clopidogrel will be continued for ideally 12 months if no bleeding problems. Plan discussed with Dr. 06/06/2022 who is the primary cardiologist.  Would plan medical therapy for his residual disease.    Cardiac Studies    Patient Profile     76 y.o. male with multiple cardiovascular risk factors presenting with chest discomfort with both typical and atypical features, elevated troponin, and symptoms of heart failure    Assessment & Plan    1.NSTEMI - peak trop 359.  Echo is reviewed and he had hyperdynamic LV function with no regional wall motion abnormalities just a few months ago, now has inferior and apical wall motion abnormalities with reduced LVEF of 50 to 55% - cath : mid LAD 90%, D1 50%, OM3 75%, ostrial RCA 80% and mid 70%. Received DES to LAD. Plans  for medical therapyt residual disease  - medical therapy with ASA 81, atorva 40, plraiv 75, imdur 30, toprol 100,    2. Acute HFpEF - 05/2022 echo LVEF 50-55%, grade I dd, e/e' 17 - medical therapy with jardiance 10mg . On oral lasix 40mg  daily.   - LVEDP by cath 5   3.  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  INR 1.8, on hep gtt.     4. 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 Likely will need structurual heart evaluation as outpatient.    For questions or updates, please contact [facility] Please consult [email or web address] for contact info under        Signed, 03/2022, MD  06/05/2022, 9:28 AM

## 2022-06-05 NOTE — Progress Notes (Signed)
PROGRESS NOTE    Tony Zhang  TKZ:[id number] DOB: Jul 20, 1946 DOA: 06/02/2022 PCP: [name], MD  75/M with history of COPD, former smoker, history of DVT/PE, chronic diastolic CHF, bilateral renal artery stenosis, chronic pain presented to the ED with intermittent chest pain and dyspnea X 1 week he describes the chest pain as pleuritic, in addition has been having worsening dyspnea on exertion for 1 month, has some edema chronically, uses compression stockings. -In the ED BP elevated, troponin 37> 150> 359, chest x-ray with pulmonary edema and small bilateral effusions, CTA chest with chronic infrarenal abdominal aortic occlusion, emphysema, mediastinal adenopathy, prostatomegaly -Admitted, started on diuretics -Echo noted new wall motion abnormality, cath 8/18 mid LAD 90%, D1 50%, OM3 75%, ostrial RCA 80% and mid 70%.  Treated with drug-eluting stent to mid LAD lesion  Subjective: -Feels okay overall, breathing is improving, still on oxygen  Assessment and Plan:  Acute respiratory failure with hypoxia (HCC) Acute on chronic diastolic CHF -In the background of known COPD, former smoker, CT evidence of emphysema -Repeat echo with EF of 50%, grade 1 DD and new wall motion abnormality, -Cards following, switch to oral Lasix, continue Aldactone and Jardiance -Can continue metoprolol, could consider low-dose Cozaar tomorrow if BP, kidney function is stable -Wean off O2 today, home tomorrow if stable  NSTEMI -Troponin peaked at 359 -Cards following, echo with new wall motion abnormality, mildly reduced EF -Continue aspirin, Plavix, Toprol, statin -Cath yesterday noted mid LAD 90%, D1 50%, OM3 75%, ostrial RCA 80% and mid 70%.  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 Continue annual f/u  Suprarenal abdominal aortic aneurysm, measuring up to 3.5 cm (from CT abdomen in 5/23) continue regular f/u  -Continue Coumadin, start statin, LDL is 85  DVT, lower extremity and pulmonary embolism, recurrent INR 1.2, subtherapeutic>heparing gtt with chest pain/elevated troponin   warfarin per pharmacy dosing, INR 1.8 today  COPD (chronic obstructive pulmonary disease) (HCC) -Stable, no exacerbation at this time, continue as needed nebs  Peripheral vascular disease (HCC) Continue pletal, fenofibrate, coumadin  Hyperlipidemia Continue fenofibrate -Continue Lipitor  Gout Continue allopurinol   DVT prophylaxis: IV heparin, Coumadin Code Status: Full code Family Communication: Discussed with patient in detail, no family at bedside Disposition Plan: Home tomorrow  Consultants:  Cardiology  Procedures:   Antimicrobials:    Objective: Vitals:   06/04/22 2148 06/05/22 0452 06/05/22 0752 06/05/22 1002  BP: (!) 108/50 (!) 117/51 (!) 123/44 (!) 179/70  Pulse: 68 70 70 79  Resp: 16 16 14    Temp: 97.8 F (36.6 C) (!) 97.5 F (36.4 C) 97.8 F (36.6 C)   TempSrc: Oral Oral Oral   SpO2: 94% 94% 96%   Weight:      Height:        Intake/Output Summary (Last 24 hours) at 06/05/2022 1118 Last data filed at 06/05/2022 1008 Gross per 24 hour  Intake 180 ml  Output 1000 ml  Net -820 ml   Filed Weights   06/03/22 1100  Weight: 93.4 kg    Examination:  Chronically ill male sitting up in bed, AAOx3, no distress HEENT: No JVD  CVS: S1-S2, regular rhythm Lungs: Improved air movement, decreased breath sounds to bases Abdomen: Soft, nontender, bowel sounds present Extremities: No edema Skin: No rashes on exposed skin Psychiatry:  Mood & affect  appropriate.     Data Reviewed:   CBC: Recent Labs  Lab 06/02/22 0752 06/02/22 1235 06/03/22 0257 06/04/22 0223 06/05/22 0254  WBC 11.2*  --  9.3 10.2 8.3  NEUTROABS 9.6*  --   --   --   --   HGB 15.9 17.7* 15.5 14.6 14.9  HCT 48.8 52.0 46.4 45.6 45.6  MCV 98.2  --  96.5 99.8 97.2  PLT 259  --  255 247 [id number]   Basic Metabolic Panel: Recent Labs  Lab 06/02/22 0752 06/02/22 1215 06/02/22 1235 06/03/22 0257 06/04/22 0223 06/05/22 0254  NA 135 136 135 133* 135 137  K 4.2 4.1 5.1 4.1 3.7 4.3  CL 106 105  --  103 102 103  CO2 17* 17*  --  20* 23 23  GLUCOSE 149* 151*  --  149* 104* 107*  BUN 13 13  --  20 27* 28*  CREATININE 0.91 0.96  --  1.22 1.28* 1.32*  CALCIUM 8.8* 8.9  --  8.7* 8.6* 8.7*   GFR: Estimated Creatinine Clearance: 53.8 mL/min (A) (by C-G formula based on SCr of 1.32 mg/dL (H)). Liver Function Tests: Recent Labs  Lab 06/02/22 1215  AST 16  ALT 10  ALKPHOS 88  BILITOT 1.6*  PROT 7.4  ALBUMIN 3.6   Recent Labs  Lab 06/02/22 0752  LIPASE 25   No results for input(s): "AMMONIA" in the last 168 hours. Coagulation Profile: Recent Labs  Lab 06/02/22 1215 06/03/22 0257 06/04/22 0223 06/05/22 0254  INR 1.2 1.3* 1.6* 1.8*   Cardiac Enzymes: No results for input(s): "CKTOTAL", "CKMB", "CKMBINDEX", "TROPONINI" in the last 168 hours. BNP (last 3 results) No results for input(s): "PROBNP" in the last 8760 hours. HbA1C: Recent Labs    06/03/22 0257  HGBA1C 5.1   CBG: No results for input(s): "GLUCAP" in the last 168 hours. Lipid Profile: Recent Labs    06/03/22 0257  CHOL 140  HDL 36*  LDLCALC 85  TRIG 97  CHOLHDL 3.9   Thyroid Function Tests: Recent Labs    06/02/22 2133  TSH 0.818   Anemia Panel: No results for input(s): "VITAMINB12", "FOLATE", "FERRITIN", "TIBC", "IRON", "RETICCTPCT" in the last 72 hours. Urine analysis:    Component Value Date/Time   COLORURINE YELLOW 02/21/2022 1357   APPEARANCEUR CLEAR 02/21/2022  1357   LABSPEC 1.015 02/21/2022 1357   PHURINE 5.0 02/21/2022 1357   GLUCOSEU NEGATIVE 02/21/2022 1357   HGBUR NEGATIVE 02/21/2022 1357   [place] 02/21/2022 1357   KETONESUR NEGATIVE 02/21/2022 1357   PROTEINUR NEGATIVE 02/21/2022 1357   UROBILINOGEN 0.2 04/08/2015 2033   NITRITE NEGATIVE 02/21/2022 1357   LEUKOCYTESUR NEGATIVE 02/21/2022 1357   Sepsis Labs: @LABRCNTIP (procalcitonin:4,lacticidven:4)  ) Recent Results (from the past 240 hour(s))  SARS Coronavirus 2 by RT PCR (hospital order, performed in [place] hospital lab) *cepheid single result test* Anterior Nasal Swab     Status: None   Collection Time: 06/02/22  7:42 AM   Specimen: Anterior Nasal Swab  Result Value Ref Range Status   SARS Coronavirus 2 by RT PCR NEGATIVE NEGATIVE Final    Comment: (NOTE) SARS-CoV-2 target nucleic acids are NOT DETECTED.  The SARS-CoV-2 RNA is generally detectable in upper and lower respiratory specimens during the acute phase of infection. The lowest  concentration of SARS-CoV-2 viral copies this assay can detect is 250 copies / mL. A negative result does not preclude SARS-CoV-2 infection and should not be used as the sole basis for treatment or other patient management decisions.  A negative result may occur with improper specimen collection / handling, submission of specimen other than nasopharyngeal swab, presence of viral mutation(s) within the areas targeted by this assay, and inadequate number of viral copies (<250 copies / mL). A negative result must be combined with clinical observations, patient history, and epidemiological information.  Fact Sheet for Patients:   RoadLapTop.co.za  Fact Sheet for Healthcare Providers: http://kim-miller.com/  This test is not yet approved or  cleared by the [place] FDA and has been authorized for detection and/or diagnosis of SARS-CoV-2 by FDA under an Emergency Use  Authorization (EUA).  This EUA will remain in effect (meaning this test can be used) for the duration of the COVID-19 declaration under Section 564(b)(1) of the Act, 21 U.S.C. section 360bbb-3(b)(1), unless the authorization is terminated or revoked sooner.  Performed at [facility], [address] [place], [place] [address]   Resp Panel by RT-PCR (Flu A&B, Covid)     Status: None   Collection Time: 06/02/22 11:02 AM   Specimen: Nasal Swab  Result Value Ref Range Status   SARS Coronavirus 2 by RT PCR NEGATIVE NEGATIVE Final    Comment: (NOTE) SARS-CoV-2 target nucleic acids are NOT DETECTED.  The SARS-CoV-2 RNA is generally detectable in upper respiratory specimens during the acute phase of infection. The lowest concentration of SARS-CoV-2 viral copies this assay can detect is 138 copies/mL. A negative result does not preclude SARS-Cov-2 infection and should not be used as the sole basis for treatment or other patient management decisions. A negative result may occur with  improper specimen collection/handling, submission of specimen other than nasopharyngeal swab, presence of viral mutation(s) within the areas targeted by this assay, and inadequate number of viral copies(<138 copies/mL). A negative result must be combined with clinical observations, patient history, and epidemiological information. The expected result is Negative.  Fact Sheet for Patients:  BloggerCourse.com  Fact Sheet for Healthcare Providers:  SeriousBroker.it  This test is no t yet approved or cleared by the [place] FDA and  has been authorized for detection and/or diagnosis of SARS-CoV-2 by FDA under an Emergency Use Authorization (EUA). This EUA will remain  in effect (meaning this test can be used) for the duration of the COVID-19 declaration under Section 564(b)(1) of the Act, 21 U.S.C.section 360bbb-3(b)(1), unless the authorization is  terminated  or revoked sooner.       Influenza A by PCR NEGATIVE NEGATIVE Final   Influenza B by PCR NEGATIVE NEGATIVE Final    Comment: (NOTE) The Xpert Xpress SARS-CoV-2/FLU/RSV plus assay is intended as an aid in the diagnosis of influenza from Nasopharyngeal swab specimens and should not be used as a sole basis for treatment. Nasal washings and aspirates are unacceptable for Xpert Xpress SARS-CoV-2/FLU/RSV testing.  Fact Sheet for Patients: BloggerCourse.com  Fact Sheet for Healthcare Providers: SeriousBroker.it  This test is not yet approved or cleared by the [place] FDA and has been authorized for detection and/or diagnosis of SARS-CoV-2 by FDA under an Emergency Use Authorization (EUA). This EUA will remain in effect (meaning this test can be used) for the duration of the COVID-19 declaration under Section 564(b)(1) of the Act, 21 U.S.C. section 360bbb-3(b)(1), unless the authorization is terminated or revoked.  Performed at [facility], [place] [address], [place], [place] [address]      Radiology Studies: CARDIAC CATHETERIZATION  Result Date: 06/04/2022   Ost RCA to Prox RCA lesion is 80% stenosed.  Heavily calcified.  Would require atherectomy to fix.   Prox RCA lesion is 25% stenosed.   Mid RCA lesion is 70% stenosed.  Diffuse disease between the ostial lesion and this lesion.   3rd Mrg lesion is 75% stenosed.   1st Diag lesion is 50% stenosed.   Ost LAD to Mid LAD lesion is 25% stenosed.   Mid LAD lesion is 90% stenosed.   A drug-eluting stent was successfully placed using a SYNERGY XD 3.0X16, postdilated to greater than 3.5 mm.   Post intervention, there is a 0% residual stenosis.   The left ventricular systolic function is normal.   LV end diastolic pressure is normal.   The left ventricular ejection fraction is 50-55% by visual estimate.   There is no aortic valve stenosis. Multivessel CAD involving the LAD  and RCA.  There is moderate diffuse disease in the proximal LAD.  The culprit lesion was a severe stenosis in the mid LAD.  This was successfully treated with a 3.0 x 16 Synergy drug-eluting stent, postdilated to greater than 3.5 mm in diameter. Resume IV heparin 2 hours after the TR band is removed.  Coumadin can also be resumed tonight.  Target INR greater than 2.  Aspirin will be used for 1 month and then stop.  Clopidogrel will be continued for ideally 12 months if no bleeding problems. Plan discussed with Dr. Burt Knack who is the primary cardiologist.  Would plan medical therapy for his residual disease.     Scheduled Meds:  allopurinol  300 mg Oral QHS   aspirin  81 mg Oral Daily   atorvastatin  40 mg Oral Daily   cilostazol  100 mg Oral BID   clopidogrel  75 mg Oral Daily   empagliflozin  10 mg Oral Daily   famotidine  20 mg Oral Daily   fenofibrate  160 mg Oral Daily   furosemide  40 mg Oral Daily   isosorbide mononitrate  30 mg Oral Daily   metoprolol succinate  100 mg Oral Daily   pantoprazole  80 mg Oral Daily   sodium chloride flush  3 mL Intravenous Q12H   sodium chloride flush  3 mL Intravenous Q12H   sodium chloride flush  3 mL Intravenous Q12H   spironolactone  25 mg Oral Daily   tamsulosin  0.4 mg Oral QPM   Warfarin - Pharmacist Dosing Inpatient   Does not apply q1600   Continuous Infusions:  sodium chloride     sodium chloride     heparin 1,100 Units/hr (06/05/22 0256)     LOS: 3 days    Time spent: 70min  Domenic Polite, MD [facility]   06/05/2022, 11:18 AM

## 2022-06-05 NOTE — Progress Notes (Signed)
CARDIAC REHAB PHASE I   PRE:  Rate/Rhythm: 75 SR  BP:  Sitting: 151/59      SaO2: 94 2L  MODE:  Ambulation: 120 ft   POST:  Rate/Rhythm: 75 SR  BP:  Sitting: 183/71      SaO2: 92 2L  Pt tolerated exercise fair and amb 120 ft pushing IV pole, and stand-by assist. Pt denies CP, SOB, or dizziness throughout walk. Pt took 3 rest breaks during walk. Ed given to pt. Discussed restrictions, MI booklet, NTG use, ASA / Plavix, heart healthy diet, and exercise guidelines. Will refer to [facility] [place]. Will continue to follow.  [id number] Joya San, [place], ACSM-CEP 06/05/2022 9:35 AM

## 2022-06-06 DIAGNOSIS — I214 Non-ST elevation (NSTEMI) myocardial infarction: Secondary | ICD-10-CM | POA: Diagnosis not present

## 2022-06-06 DIAGNOSIS — J9601 Acute respiratory failure with hypoxia: Secondary | ICD-10-CM | POA: Diagnosis not present

## 2022-06-06 LAB — BASIC METABOLIC PANEL
Anion gap: 12 (ref 5–15)
BUN: 34 mg/dL — ABNORMAL HIGH (ref 8–23)
CO2: 19 mmol/L — ABNORMAL LOW (ref 22–32)
Calcium: 8.2 mg/dL — ABNORMAL LOW (ref 8.9–10.3)
Chloride: 102 mmol/L (ref 98–111)
Creatinine, Ser: 1.67 mg/dL — ABNORMAL HIGH (ref 0.61–1.24)
GFR, Estimated: 42 mL/min — ABNORMAL LOW (ref >60)
Glucose, Bld: 90 mg/dL (ref 70–99)
Potassium: 4.1 mmol/L (ref 3.5–5.1)
Sodium: 133 mmol/L — ABNORMAL LOW (ref 135–145)

## 2022-06-06 LAB — PROTIME-INR
INR: 2.2 — ABNORMAL HIGH (ref 0.8–1.2)
Prothrombin Time: 24.5 seconds — ABNORMAL HIGH (ref 11.4–15.2)

## 2022-06-06 LAB — CBC
HCT: 45.1 % (ref 39.0–52.0)
Hemoglobin: 15.2 g/dL (ref 13.0–17.0)
MCH: 32.2 pg (ref 26.0–34.0)
MCHC: 33.7 g/dL (ref 30.0–36.0)
MCV: 95.6 fL (ref 80.0–100.0)
Platelets: 251 10*3/uL (ref 150–400)
RBC: 4.72 MIL/uL (ref 4.22–5.81)
RDW: 15 % (ref 11.5–15.5)
WBC: 7.3 10*3/uL (ref 4.0–10.5)
nRBC: 0 % (ref 0.0–0.2)

## 2022-06-06 LAB — HEPARIN LEVEL (UNFRACTIONATED): Heparin Unfractionated: 0.24 IU/mL — ABNORMAL LOW (ref 0.30–0.70)

## 2022-06-06 MED ORDER — ATORVASTATIN CALCIUM 40 MG PO TABS: 40.0000 mg | ORAL_TABLET | Freq: Every day | ORAL | 0 refills | Status: DC

## 2022-06-06 MED ORDER — ASPIRIN 81 MG PO CHEW: 81.0000 mg | CHEWABLE_TABLET | Freq: Every day | ORAL | 0 refills | Status: AC

## 2022-06-06 MED ORDER — FUROSEMIDE 20 MG PO TABS: 20.0000 mg | ORAL_TABLET | Freq: Every day | ORAL | 0 refills | Status: DC

## 2022-06-06 MED ORDER — EMPAGLIFLOZIN 10 MG PO TABS: 10.0000 mg | ORAL_TABLET | Freq: Every day | ORAL | 0 refills | Status: DC

## 2022-06-06 MED ORDER — SPIRONOLACTONE 25 MG PO TABS: 25.0000 mg | ORAL_TABLET | Freq: Every day | ORAL | 0 refills | Status: DC

## 2022-06-06 MED ORDER — FUROSEMIDE 20 MG PO TABS: 20.0000 mg | ORAL_TABLET | Freq: Every day | ORAL | Status: DC

## 2022-06-06 MED ORDER — CLOPIDOGREL BISULFATE 75 MG PO TABS: 75.0000 mg | ORAL_TABLET | Freq: Every day | ORAL | 0 refills | Status: DC

## 2022-06-06 NOTE — Discharge Summary (Signed)
Physician Discharge Summary  Tony Zhang OAC:[id number] DOB: 10-Jun-1946 DOA: 06/02/2022  PCP: Tamsen Roers, MD  Admit date: 06/02/2022 Discharge date: 06/06/2022  Time spent: 35  minutes  Recommendations for Outpatient Follow-up:  Dr. 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)   Discharge Condition: Improved  Diet recommendation: Low-sodium, heart healthy  Filed Weights   06/03/22 1100  Weight: 93.4 kg    History of present illness:  75/M with history of COPD, former smoker, history of DVT/PE, chronic diastolic CHF, bilateral renal artery stenosis, chronic pain presented to the ED with intermittent chest pain and dyspnea X 1 week he describes the chest pain as pleuritic, in addition has been having worsening dyspnea on exertion for 1 month, has some edema chronically, uses compression stockings. -In the ED BP elevated, troponin 37> 150> 359, chest x-ray with pulmonary edema and small bilateral effusions, CTA chest with chronic infrarenal abdominal aortic occlusion, emphysema, mediastinal adenopathy, prostatomegaly  Hospital Course:   Acute respiratory failure with hypoxia (HCC) Acute on chronic  diastolic CHF -In the background of known COPD, former smoker, CT evidence of emphysema -Repeat echo with EF of 50%, grade 1 DD and new wall motion abnormality, -Diuresed with IV Lasix,  also started on Jardiance and Aldactone  -Continue metoprolol, mild bump in creatinine to 1.6 today, advised to hold Lasix today and tomorrow and resume this on Sunday 8/22 -Attempt to wean off O2 prior to discharge   NSTEMI -Troponin peaked at 359 -Cards following, echo with new wall motion abnormality, mildly reduced of 50-55% -Continue aspirin, Plavix, Toprol, statin -Cath 8/18 noted mid LAD 90%, D1 50%, OM3 75%, ostrial RCA 80% and mid 70%.  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 Continue annual f/u  Suprarenal abdominal aortic aneurysm, measuring up to 3.5 cm (from CT abdomen in 5/23) continue regular f/u  -Continue Coumadin, start statin, LDL is 85   DVT, lower extremity and pulmonary embolism, recurrent INR 1.2, subtherapeutic on admission, bridged with heparin GTT, now INR is therapeutic, continue Coumadin   COPD (chronic obstructive pulmonary disease) (HCC) -Stable, no exacerbation at this time, continue as needed nebs   Peripheral vascular disease (HCC) Continue pletal, fenofibrate, coumadin   Hyperlipidemia Continue fenofibrate -Continue Lipitor   Gout Continue allopurinol   Procedures -Echo noted EF of 50-55% with new wall motion abnormality,  -  cath 8/18 mid LAD 90%, D1 50%, OM3 75%, ostrial RCA 80% and mid 70%.  Treated with drug-eluting stent to mid LAD  lesion  Discharge Exam: Vitals:   06/06/22 0529 06/06/22 0916  BP: (!) 111/44 (!) 130/53  Pulse: 73 86  Resp: 18 18  Temp: 98.1 F (36.7 C) 98.3 F (36.8 C)  SpO2: 96% 91%   Chronically ill male sitting up in bed, AAOx3, no distress HEENT: No JVD CVS: S1-S2, regular rhythm Lungs: Improved air movement, decreased breath sounds to bases Abdomen: Soft, nontender, bowel sounds present Extremities: No edema Skin: No rashes on exposed skin Psychiatry:  Mood & affect appropriate.   Discharge Instructions   Discharge Instructions     Amb Referral to Cardiac Rehabilitation   Complete by: As directed    Diagnosis:  Coronary Stents NSTEMI     After initial evaluation and assessments completed: Virtual Based Care may be provided alone or in conjunction with Phase 2 Cardiac Rehab based on patient barriers.: Yes   Diet - low sodium heart healthy   Complete by: As directed    Increase activity slowly   Complete by: As directed       Allergies as of 06/06/2022       Reactions   Codeine Hives, Other (See Comments)   Takes benadryl to stop allergic reactions        Medication List     STOP taking these medications    hydrochlorothiazide 25 MG tablet Commonly known as: HYDRODIURIL   lisinopril 10 MG tablet Commonly known as: ZESTRIL       TAKE these medications    albuterol 108 (90 Base) MCG/ACT inhaler Commonly known as: VENTOLIN HFA Inhale 2 puffs into the lungs 5 (five) times daily.   allopurinol 300 MG tablet Commonly known as: ZYLOPRIM Take 300 mg by mouth at bedtime.   aspirin 81 MG chewable tablet Chew 1 tablet (81 mg total) by mouth daily. Start taking on: June 07, 2022   atorvastatin 40 MG tablet Commonly known as: LIPITOR Take 1 tablet (40 mg total) by mouth daily. Start taking on: June 07, 2022   cilostazol 100 MG tablet Commonly known as: PLETAL Take 1 tablet (100 mg total) by mouth 2 (two) times daily. Please schedule appointment for  refills. What changed: additional instructions   clopidogrel 75 MG tablet Commonly known as: PLAVIX Take 1 tablet (75 mg total) by mouth daily. Start taking on: June 07, 2022   cyclobenzaprine 10 MG tablet Commonly known as: FLEXERIL Take 10 mg by mouth 3 (three) times daily as needed for muscle spasms.   empagliflozin 10 MG Tabs tablet Commonly known as: JARDIANCE Take 1 tablet (10 mg total) by mouth daily. Start taking on: June 07, 2022   enoxaparin 100 MG/ML injection Commonly known as: LOVENOX Inject 1 mL (100 mg total) into the skin every 12 (twelve) hours for 7 days. Until INR is > 2   famotidine 20 MG tablet Commonly known as: PEPCID Take 1 tablet (20 mg total) by mouth daily.   Fenofibrate 120 MG Tabs Take 120 mg by mouth daily.   furosemide 20 MG tablet Commonly known as: Lasix Take 1 tablet (20 mg total) by mouth daily. Start on Tuesday 8/22 Start taking on: June 08, 2022   metoprolol succinate 100 MG 24 hr tablet Commonly known as: TOPROL-XL Take 100 mg by mouth daily.   naloxone 4 MG/0.1ML Liqd nasal spray kit Commonly known as: NARCAN Place 4 mg into  the nose as needed.   omeprazole 40 MG capsule Commonly known as: PRILOSEC Take 40 mg by mouth at bedtime.   ondansetron 8 MG tablet Commonly known as: ZOFRAN Take 8 mg by mouth 2 (two) times daily as needed for vomiting or nausea.   oxyCODONE 15 MG immediate release tablet Commonly known as: ROXICODONE Take 15 mg by mouth 4 (four) times daily as needed for pain.   spironolactone 25 MG tablet Commonly known as: ALDACTONE Take 1 tablet (25 mg total) by mouth daily. Start taking on: June 07, 2022   tamsulosin 0.4 MG Caps capsule Commonly known as: FLOMAX Take 0.4 mg by mouth every evening.   warfarin 5 MG tablet Commonly known as: COUMADIN Take as directed. If you are unsure how to take this medication, talk to your nurse or doctor. Original instructions: Take 5 mg by mouth See admin  instructions. Take 1 tablet (5 mg) on Tuesday, Wednesday, Thursday, Saturday, Sunday. On Monday and Friday do not take. Or as directed by Coumadin clinic.       Allergies  Allergen Reactions   Codeine Hives and Other (See Comments)    Takes benadryl to stop allergic reactions    Follow-up Information     Tamsen Roers, MD Follow up.   Specialty: Family Medicine Contact information: [address] [place] [address] [phone]         Sanda Klein, MD .   Specialty: Cardiology Contact information: [address] [place] [address] [phone]         [facility]. Go in 13 day(s).   Specialty: Cardiology Why: Hospital follow up PLEASE bring a current medication list to appointment FREE valet parking, Entrance C, off of [employer] information: [address] [id number] [place] [phone]                 The results of significant diagnostics from this hospitalization (including imaging, microbiology, ancillary and laboratory) are listed below for reference.    Significant Diagnostic Studies: CARDIAC CATHETERIZATION  Result Date: 06/04/2022   Ost RCA to Prox RCA lesion is 80% stenosed.  Heavily calcified.  Would require atherectomy to fix.   Prox RCA lesion is 25% stenosed.   Mid RCA lesion is 70% stenosed.  Diffuse disease between the ostial lesion and this lesion.   3rd Mrg lesion is 75% stenosed.   1st Diag lesion is 50% stenosed.   Ost LAD to Mid LAD lesion is 25% stenosed.   Mid LAD lesion is 90% stenosed.   A drug-eluting stent was successfully placed using a SYNERGY XD [id number], postdilated to greater than 3.5 mm.   Post intervention, there is a 0% residual stenosis.   The left ventricular systolic function is normal.   LV end diastolic pressure is normal.   The left ventricular ejection fraction is 50-55% by visual estimate.   There is no  aortic valve stenosis. 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  There is moderate diffuse disease in the proximal LAD.  The culprit lesion was a severe stenosis in the mid LAD.  This was successfully treated with a 3.0 x 16 Synergy drug-eluting stent, postdilated to greater than 3.5 mm in diameter. Resume IV heparin 2 hours after the TR band is removed.  Coumadin can also be resumed tonight.  Target INR greater than 2.  Aspirin will be used for 1 month and then stop.  Clopidogrel  will be continued for ideally 12 months if no bleeding problems. Plan discussed with Dr. Burt Knack who is the primary cardiologist.  Would plan medical therapy for his residual disease.   ECHOCARDIOGRAM LIMITED  Result Date: 06/03/2022    ECHOCARDIOGRAM LIMITED REPORT   Patient Name:   Tony Zhang Date of Exam: 06/03/2022 Medical Rec #:  [id number]     Height:       73.0 in Accession #:    [id number]    Weight:       206.0 lb Date of Birth:  January 06, 1946     BSA:          2.179 m Patient Age:    75 years      BP:           166/74 mmHg Patient Gender: M             HR:           72 bpm. Exam Location:  Inpatient Procedure: Limited Echo, Cardiac Doppler, Color Doppler and Intracardiac            Opacification Agent Indications:    Chest Pain R07.9  History:        Patient has prior history of Echocardiogram examinations, most                 recent 02/22/2022. COPD and PAD, Aortic Valve Disease; Risk                 Factors:Former Smoker and Hypertension. AAA.  Sonographer:    Darlina Sicilian RDCS Referring Phys: [place]  Sonographer Comments: Image acquisition and PEDOF windows challenging due to respiratory motion. IMPRESSIONS  1. Left ventricular ejection fraction, by estimation, is 50 to 55%. The left ventricle has low normal function. The left ventricle demonstrates regional wall motion abnormalities (see scoring diagram/findings for description). Left ventricular diastolic  parameters are consistent with Grade I diastolic  dysfunction (impaired relaxation). Elevated left ventricular end-diastolic pressure. The E/e' is 30. There is severe hypokinesis of the left ventricular, mid-apical inferior wall, inferoseptal wall and apical segment. The apex appears anerusymal.  2. The mitral valve is abnormal. Trivial mitral valve regurgitation.  3. The aortic valve is calcified. There is severe calcifcation of the aortic valve. Moderate to severe aortic valve stenosis. Aortic valve area, by VTI measures 0.48 cm. Aortic valve mean gradient measures 36.0 mmHg. Aortic valve Vmax measures 3.57 m/s. Peak gradient 51 mmHg, DI 0.21.  4. There is normal pulmonary artery systolic pressure.  5. The inferior vena cava is normal in size with greater than 50% respiratory variability, suggesting right atrial pressure of 3 mmHg. Comparison(s): Prior images reviewed side by side. Changes from prior study are noted. 02/22/2022: LVEF >75%, mild (probably moderate) AS - mean gradient 16 mmHg, DI 0.33. FINDINGS  Left Ventricle: Left ventricular ejection fraction, by estimation, is 50 to 55%. The left ventricle has low normal function. The left ventricle demonstrates regional wall motion abnormalities. Severe hypokinesis of the left ventricular, mid-apical inferior wall, inferoseptal wall and apical segment. Definity contrast agent was given IV to delineate the left ventricular endocardial borders. Left ventricular diastolic parameters are consistent with Grade I diastolic dysfunction (impaired relaxation). Elevated left ventricular end-diastolic pressure. The E/e' is 34.  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 The mid and distal inferior wall and apical septal segment are hypokinetic. The mid inferoseptal segment is normal. The apex appears anerusymal. Right Ventricle: There is normal pulmonary artery systolic  pressure. The tricuspid regurgitant velocity is 2.77 m/s, and with an assumed right atrial pressure of 3 mmHg, the estimated right ventricular systolic  pressure is 18.8 mmHg. Mitral Valve: The mitral valve is abnormal. Mild to moderate mitral annular calcification. Trivial mitral valve regurgitation. Tricuspid Valve: The tricuspid valve is grossly normal. Tricuspid valve regurgitation is mild. Aortic Valve: The aortic valve is calcified. There is severe calcifcation of the aortic valve. Severe aortic stenosis is present. Aortic valve mean gradient measures 36.0 mmHg. Aortic valve peak gradient measures 51.0 mmHg. Aortic valve area, by VTI measures 0.48 cm. Venous: The inferior vena cava is normal in size with greater than 50% respiratory variability, suggesting right atrial pressure of 3 mmHg. LEFT VENTRICLE PLAX 2D LVOT diam:     1.70 cm      Diastology LV SV:         43           LV e' medial:    5.66 cm/s LV SV Index:   20           LV E/e' medial:  19.1 LVOT Area:     2.27 cm     LV e' lateral:   6.96 cm/s                             LV E/e' lateral: 15.5  LV Volumes (MOD) LV vol d, MOD A2C: 85.3 ml LV vol d, MOD A4C: 104.0 ml LV vol s, MOD A2C: 24.7 ml LV vol s, MOD A4C: 28.7 ml LV SV MOD A2C:     60.6 ml LV SV MOD A4C:     104.0 ml LV SV MOD BP:      68.3 ml AORTIC VALVE AV Area (Vmax):    0.47 cm AV Area (Vmean):   0.46 cm AV Area (VTI):     0.48 cm AV Vmax:           357.00 cm/s AV Vmean:          292.000 cm/s AV VTI:            0.898 m AV Peak Grad:      51.0 mmHg AV Mean Grad:      36.0 mmHg LVOT Vmax:         74.20 cm/s LVOT Vmean:        59.800 cm/s LVOT VTI:          0.189 m LVOT/AV VTI ratio: 0.21 MITRAL VALVE                TRICUSPID VALVE MV Area (PHT): 3.40 cm     TR Peak grad:   30.7 mmHg MV Decel Time: 223 msec     TR Vmax:        277.00 cm/s MV E velocity: 108.00 cm/s MV A velocity: 114.00 cm/s  SHUNTS MV E/A ratio:  0.95         Systemic VTI:  0.19 m                             Systemic Diam: 1.70 cm Lyman Bishop MD Electronically signed by Lyman Bishop MD Signature Date/Time: 06/03/2022/10:39:17 AM    Final    CT Angio Chest/Abd/Pel  for Dissection W and/or Wo Contrast  Result Date: 06/02/2022 CLINICAL DATA:  76 year old male with chest and abdominal pain, concern for acute aortic syndrome. EXAM: CT  ANGIOGRAPHY CHEST, ABDOMEN AND PELVIS TECHNIQUE: Non-contrast CT of the chest was initially obtained. Multidetector CT imaging through the chest, abdomen and pelvis was performed using the standard protocol during bolus administration of intravenous contrast. Multiplanar reconstructed images and MIPs were obtained and reviewed to evaluate the vascular anatomy. RADIATION DOSE REDUCTION: This exam was performed according to the departmental dose-optimization program which includes automated exposure control, adjustment of the mA and/or kV according to patient size and/or use of iterative reconstruction technique. CONTRAST:  126m OMNIPAQUE IOHEXOL 350 MG/ML SOLN COMPARISON:  Chest CT from 02/21/2022, chest abdomen pelvis from 11/09/2018 FINDINGS: CTA CHEST FINDINGS VASCULAR Preferential opacification of the thoracic aorta. No evidence of thoracic aortic aneurysm or dissection. Diffuse circumferential coronary atherosclerotic calcifications about the aortic arch and descending thoracic aorta. Normal heart size. No pericardial effusion. Sinues of Valsalva: 31 mm 32 x 31 mm ,unchanged Sinotubular Junction: 32 mm ,unchanged Ascending Aorta: 37 mm ,unchanged Aortic Arch: Fusiform aneurysmal dilation of zone 2 measuring up to 39 mm ,unchanged Descending aorta: 28 mm at the level of the carina ,unchanged Branch vessels: Conventional branching pattern. Ostial atherosclerotic changes without evidence of flow-limiting stenosis. Coronary arteries: Normal origins and courses. Severe atherosclerotic calcifications. Main pulmonary artery: 33 mm ,unchanged. No evidence of central pulmonary embolism. Pulmonary veins: No anomalous pulmonary venous return. No evidence of left atrial appendage thrombus. NON VASCULAR Mediastinum/Nodes: Interval enlargement of previously  visualized prominent mediastinal lymph nodes, the largest in the aortopulmonic window measuring up to 1.7 cm in short axis, previously 1.1 cm. Lungs/Pleura: Severe upper lobe predominant centrilobular emphysema. Diffuse bronchial wall thickening. Interval development trace, right greater than pleural effusions. Bibasilar subsegmental atelectasis. Musculoskeletal: No chest wall abnormality. No acute or significant osseous findings. Review of the MIP images confirms the above findings. CTA ABDOMEN AND PELVIS FINDINGS VASCULAR Aorta: Circumferential fibrofatty and calcific atherosclerotic about the suprarenal abdominal aorta which is normal in caliber and patent. 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 Celiac: Mild ostial stenosis, likely secondary to median arcuate ligament compression. Patent distally. SMA: Moderate proximal stenosis secondary to atherosclerotic plaque. Patent distally. Renals: Single bilateral renal arteries with moderate ostial stenosis secondary to atherosclerotic plaque. IMA: Occluded. Inflow: Similar appearing total occlusion of the common and external iliac arteries which reconstitute distally via the inferior epigastric arteries. Internal iliac reconstitution via prominent arc of [place]. Veins: Duplicated IVC. Limited evaluation of abdominopelvic venous patency secondary to arterial phase image acquisition Review of the MIP images confirms the above findings. NON-VASCULAR Hepatobiliary: Unchanged 1.5 cm right lobe posterior simple hepatic cysts. The liver is otherwise normal in size, contour, and attenuation. The gallbladder is present with a single punctate partially calcified gallstone near the infundibulum, no surrounding inflammatory changes. No intra or extrahepatic biliary ductal dilation. Pancreas: Unremarkable. No pancreatic ductal dilatation or surrounding inflammatory changes. Spleen: Normal in size without focal abnormality. Adrenals/Urinary Tract: Adrenal glands are  unremarkable. Unchanged appearance of multifocal bilateral simple appearing renal cysts. Kidneys are otherwise normal, without renal calculi, focal lesion, or hydronephrosis. Bladder is unremarkable. Stomach/Bowel: Stomach is within normal limits. Appendix is not definitively identified. Redundant sigmoid colon. No evidence of bowel wall thickening, distention, or inflammatory changes. Lymphatic: No abdominopelvic lymphadenopathy. Reproductive: The prostate measures up to 6.5 cm in greatest axial dimension, previously 5.6 cm. Other: No abdominal wall hernia or abnormality. No abdominopelvic ascites. Musculoskeletal: No acute or significant osseous findings. IMPRESSION: VASCULAR 1. No evidence of acute aortic syndrome. 2. Similar appearing chronic infrarenal abdominal aortic occlusion extending through the common  and external iliac arteries. 3. 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 Recommend annual imaging followup by CTA or MRA. This recommendation follows 2010 ACCF/AHA/AATS/ACR/ASA/SCA/SCAI/SIR/STS/SVM Guidelines for the Diagnosis and Management of Patients with Thoracic Aortic Disease. Circulation.2010; 121: N027-O536. Aortic aneurysm NOS (ICD10-I71.9) 4.  Coronary and aortic Atherosclerosis (ICD10-I70.0). NON-VASCULAR 1. Interval enlargement of mediastinal lymphadenopathy of indeterminate significance. 2. Trace right greater than left pleural effusions. 3. Similar appearing severe emphysema (ICD10-J43.9). 4. Prostatomegaly. Ruthann Cancer, MD [facility] Electronically Signed   By: Ruthann Cancer M.D.   On: 06/02/2022 13:48   DG Chest 2 View  Result Date: 06/02/2022 CLINICAL DATA:  Shortness of breath, chest pain. EXAM: CHEST - 2 VIEW COMPARISON:  Feb 21, 2022. FINDINGS: The heart size and mediastinal contours are within normal limits. Increased interstitial densities are noted in both lung bases consistent with pulmonary  edema. Small bilateral pleural effusions are noted. The visualized skeletal structures are unremarkable. IMPRESSION: Increased bibasilar interstitial densities are noted most consistent with pulmonary edema. Small bilateral pleural effusions are noted as well. Electronically Signed   By: Marijo Conception M.D.   On: 06/02/2022 08:13    Microbiology: Recent Results (from the past 240 hour(s))  SARS Coronavirus 2 by RT PCR (hospital order, performed in [facility] hospital lab) *cepheid single result test* Anterior Nasal Swab     Status: None   Collection Time: 06/02/22  7:42 AM   Specimen: Anterior Nasal Swab  Result Value Ref Range Status   SARS Coronavirus 2 by RT PCR NEGATIVE NEGATIVE Final    Comment: (NOTE) SARS-CoV-2 target nucleic acids are NOT DETECTED.  The SARS-CoV-2 RNA is generally detectable in upper and lower respiratory specimens during the acute phase of infection. The lowest concentration of SARS-CoV-2 viral copies this assay can detect is 250 copies / mL. A negative result does not preclude SARS-CoV-2 infection and should not be used as the sole basis for treatment or other patient management decisions.  A negative result may occur with improper specimen collection / handling, submission of specimen other than nasopharyngeal swab, presence of viral mutation(s) within the areas targeted by this assay, and inadequate number of viral copies (<250 copies / mL). A negative result must be combined with clinical observations, patient history, and epidemiological information.  Fact Sheet for Patients:   https://www.patel.info/  Fact Sheet for Healthcare Providers: https://hall.com/  This test is not yet approved or  cleared by the [place] FDA and has been authorized for detection and/or diagnosis of SARS-CoV-2 by FDA under an Emergency Use Authorization (EUA).  This EUA will remain in effect (meaning this test can be used) for  the duration of the COVID-19 declaration under Section 564(b)(1) of the Act, 21 U.S.C. section 360bbb-3(b)(1), unless the authorization is terminated or revoked sooner.  Performed at [facility], [place] [address] [place], [place] [address]   Resp Panel by RT-PCR (Flu A&B, Covid)     Status: None   Collection Time: 06/02/22 11:02 AM   Specimen: Nasal Swab  Result Value Ref Range Status   SARS Coronavirus 2 by RT PCR NEGATIVE NEGATIVE Final    Comment: (NOTE) SARS-CoV-2 target nucleic acids are NOT DETECTED.  The SARS-CoV-2 RNA is generally detectable in upper respiratory specimens during the acute phase of infection. The lowest concentration of SARS-CoV-2 viral copies this assay can detect is 138 copies/mL. A negative result does not preclude SARS-Cov-2 infection and should not be  used as the sole basis for treatment or other patient management decisions. A negative result may occur with  improper specimen collection/handling, submission of specimen other than nasopharyngeal swab, presence of viral mutation(s) within the areas targeted by this assay, and inadequate number of viral copies(<138 copies/mL). A negative result must be combined with clinical observations, patient history, and epidemiological information. The expected result is Negative.  Fact Sheet for Patients:  EntrepreneurPulse.com.au  Fact Sheet for Healthcare Providers:  IncredibleEmployment.be  This test is no t yet approved or cleared by the [place] FDA and  has been authorized for detection and/or diagnosis of SARS-CoV-2 by FDA under an Emergency Use Authorization (EUA). This EUA will remain  in effect (meaning this test can be used) for the duration of the COVID-19 declaration under Section 564(b)(1) of the Act, 21 U.S.C.section 360bbb-3(b)(1), unless the authorization is terminated  or revoked sooner.       Influenza A by PCR NEGATIVE NEGATIVE Final    Influenza B by PCR NEGATIVE NEGATIVE Final    Comment: (NOTE) The Xpert Xpress SARS-CoV-2/FLU/RSV plus assay is intended as an aid in the diagnosis of influenza from Nasopharyngeal swab specimens and should not be used as a sole basis for treatment. Nasal washings and aspirates are unacceptable for Xpert Xpress SARS-CoV-2/FLU/RSV testing.  Fact Sheet for Patients: EntrepreneurPulse.com.au  Fact Sheet for Healthcare Providers: IncredibleEmployment.be  This test is not yet approved or cleared by the [place] FDA and has been authorized for detection and/or diagnosis of SARS-CoV-2 by FDA under an Emergency Use Authorization (EUA). This EUA will remain in effect (meaning this test can be used) for the duration of the COVID-19 declaration under Section 564(b)(1) of the Act, 21 U.S.C. section 360bbb-3(b)(1), unless the authorization is terminated or revoked.  Performed at [facility], [place] [address], [place], [place] [address]      Labs: Basic Metabolic Panel: Recent Labs  Lab 06/02/22 1215 06/02/22 1235 06/03/22 0257 06/04/22 0223 06/05/22 0254 06/06/22 0540  NA 136 135 133* 135 137 133*  K 4.1 5.1 4.1 3.7 4.3 4.1  CL 105  --  103 102 103 102  CO2 17*  --  20* 23 23 19*  GLUCOSE 151*  --  149* 104* 107* 90  BUN 13  --  20 27* 28* 34*  CREATININE 0.96  --  1.22 1.28* 1.32* 1.67*  CALCIUM 8.9  --  8.7* 8.6* 8.7* 8.2*   Liver Function Tests: Recent Labs  Lab 06/02/22 1215  AST 16  ALT 10  ALKPHOS 88  BILITOT 1.6*  PROT 7.4  ALBUMIN 3.6   Recent Labs  Lab 06/02/22 0752  LIPASE 25   No results for input(s): "AMMONIA" in the last 168 hours. CBC: Recent Labs  Lab 06/02/22 0752 06/02/22 1235 06/03/22 0257 06/04/22 0223 06/05/22 0254 06/06/22 0540  WBC 11.2*  --  9.3 10.2 8.3 7.3  NEUTROABS 9.6*  --   --   --   --   --   HGB 15.9 17.7* 15.5 14.6 14.9 15.2  HCT 48.8 52.0 46.4 45.6 45.6 45.1  MCV 98.2  --  96.5  99.8 97.2 95.6  PLT 259  --  255 247 239 251   Cardiac Enzymes: No results for input(s): "CKTOTAL", "CKMB", "CKMBINDEX", "TROPONINI" in the last 168 hours. BNP: BNP (last 3 results) Recent Labs    02/21/22 1334 06/02/22 0953  BNP 25.2 1,245.2*    ProBNP (last 3 results) No results for input(s): "PROBNP"  in the last 8760 hours.  CBG: No results for input(s): "GLUCAP" in the last 168 hours.     Signed:  Domenic Polite MD.  [facility] 06/06/2022, 11:36 AM

## 2022-06-06 NOTE — Progress Notes (Addendum)
ANTICOAGULATION CONSULT NOTE- follow-up  Pharmacy Consult for warfarin with heparin bridge Indication: Hx DVT/PE   Allergies  Allergen Reactions   Codeine Hives and Other (See Comments)    Takes benadryl to stop allergic reactions    Patient Measurements: Height: 6\' 1"  (185.4 cm) Weight: 93.4 kg (206 lb) IBW/kg (Calculated) : 79.9 Heparin dosing weight 93.4 kg  Vital Signs: Temp: 98.1 F (36.7 C) (08/20 0529) Temp Source: Oral (08/20 0529) BP: 111/44 (08/20 0529) Pulse Rate: 73 (08/20 0529)  Labs: Recent Labs    06/04/22 0223 06/05/22 0254 06/05/22 0952 06/05/22 1924 06/06/22 0540  HGB 14.6 14.9  --   --  15.2  HCT 45.6 45.6  --   --  45.1  PLT 247 239  --   --  251  LABPROT 18.6* 21.1*  --   --  24.5*  INR 1.6* 1.8*  --   --  2.2*  HEPARINUNFRC 0.46  --  <0.10* 0.16* 0.24*  CREATININE 1.28* 1.32*  --   --  1.67*     Estimated Creatinine Clearance: 42.5 mL/min (A) (by C-G formula based on SCr of 1.67 mg/dL (H)).  Assessment: 19 YOM presenting with SOB, hx DVT on warfarin PTA, last dose 8/15 and INR on admission is 1.2. PTA dosing: 5mg  daily except Mon and Fri no doses  Patient is now s/p 2x doses of 7.5 mg and 3x doses of 5 mg warfarin. Patient received warfarin 10mg  last night. Will hold today. Hgb stable and wnl at 15.2, plts wnl at 251. Eating 75-100% of meals.   INR therapeutic at 2.2. Will discontinue heparin.   Goal of Therapy:  INR 2-3 Monitor platelets by anticoagulation protocol: Yes   Plan:  Discontinue heparin Hold warfarin today Daily INR and CBC Follow up signs of bleeding, clinical status    Sun, PharmD Pharmacy Resident  06/06/2022 8:06 AM

## 2022-06-06 NOTE — Progress Notes (Signed)
Progress Note  Patient Name: Tony Zhang Date of Encounter: 06/06/2022  [facility] Cardiologist: Thurmon Fair, MD   Subjective   No complaints.  Inpatient Medications    Scheduled Meds:  allopurinol  300 mg Oral QHS   aspirin  81 mg Oral Daily   atorvastatin  40 mg Oral Daily   cilostazol  100 mg Oral BID   clopidogrel  75 mg Oral Daily   empagliflozin  10 mg Oral Daily   famotidine  20 mg Oral Daily   fenofibrate  160 mg Oral Daily   furosemide  40 mg Oral Daily   metoprolol succinate  100 mg Oral Daily   pantoprazole  80 mg Oral Daily   sodium chloride flush  3 mL Intravenous Q12H   sodium chloride flush  3 mL Intravenous Q12H   spironolactone  25 mg Oral Daily   tamsulosin  0.4 mg Oral QPM   Warfarin - Pharmacist Dosing Inpatient   Does not apply q1600   Continuous Infusions:  sodium chloride     PRN Meds: sodium chloride, acetaminophen **OR** acetaminophen, albuterol, cyclobenzaprine, hydrALAZINE, morphine injection, ondansetron **OR** ondansetron (ZOFRAN) IV, oxyCODONE, sodium chloride flush   Vital Signs    Vitals:   06/05/22 2039 06/05/22 2300 06/05/22 2319 06/06/22 0529  BP: (!) 119/59   (!) 111/44  Pulse: 76 70 71 73  Resp: 19   18  Temp: 97.6 F (36.4 C)   98.1 F (36.7 C)  TempSrc: Oral   Oral  SpO2: 92% (!) 84% 94% 96%  Weight:      Height:        Intake/Output Summary (Last 24 hours) at 06/06/2022 0910 Last data filed at 06/06/2022 0554 Gross per 24 hour  Intake 557.63 ml  Output 900 ml  Net -342.37 ml      06/03/2022   11:00 AM 02/21/2022    7:26 PM 09/13/2020    6:47 AM  Last 3 Weights  Weight (lbs) 206 lb 206 lb 231 lb  Weight (kg) 93.441 kg 93.441 kg 104.781 kg      Telemetry    SR - Personally Reviewed  ECG    N/a - Personally Reviewed  Physical Exam   GEN: No acute distress.   Neck: No JVD Cardiac: RRR, 2/6 systolic murmur rusb Respiratory: Clear to auscultation bilaterally. GI: Soft, nontender, non-distended   MS: No edema; No deformity. Neuro:  Nonfocal  Psych: Normal affect   Labs    High Sensitivity Troponin:   Recent Labs  Lab 06/02/22 0752 06/02/22 0953 06/02/22 1530 06/03/22 0544  TROPONINIHS 37* 157* 359* 307*     Chemistry Recent Labs  Lab 06/02/22 1215 06/02/22 1235 06/04/22 0223 06/05/22 0254 06/06/22 0540  NA 136   < > 135 137 133*  K 4.1   < > 3.7 4.3 4.1  CL 105   < > 102 103 102  CO2 17*   < > 23 23 19*  GLUCOSE 151*   < > 104* 107* 90  BUN 13   < > 27* 28* 34*  CREATININE 0.96   < > 1.28* 1.32* 1.67*  CALCIUM 8.9   < > 8.6* 8.7* 8.2*  PROT 7.4  --   --   --   --   ALBUMIN 3.6  --   --   --   --   AST 16  --   --   --   --   ALT 10  --   --   --   --  ALKPHOS 88  --   --   --   --   BILITOT 1.6*  --   --   --   --   GFRNONAA >60   < > 58* 56* 42*  ANIONGAP 14   < > 10 11 12    < > = values in this interval not displayed.    Lipids  Recent Labs  Lab 06/03/22 0257  CHOL 140  TRIG 97  HDL 36*  LDLCALC 85  CHOLHDL 3.9    Hematology Recent Labs  Lab 06/04/22 0223 06/05/22 0254 06/06/22 0540  WBC 10.2 8.3 7.3  RBC 4.57 4.69 4.72  HGB 14.6 14.9 15.2  HCT 45.6 45.6 45.1  MCV 99.8 97.2 95.6  MCH 31.9 31.8 32.2  MCHC 32.0 32.7 33.7  RDW 15.4 15.2 15.0  PLT 247 239 251   Thyroid  Recent Labs  Lab 06/02/22 2133  TSH 0.818    BNP Recent Labs  Lab 06/02/22 0953  BNP 1,245.2*    DDimer No results for input(s): "DDIMER" in the last 168 hours.   Radiology    CARDIAC CATHETERIZATION  Result Date: 06/04/2022   Ost RCA to Prox RCA lesion is 80% stenosed.  Heavily calcified.  Would require atherectomy to fix.   Prox RCA lesion is 25% stenosed.   Mid RCA lesion is 70% stenosed.  Diffuse disease between the ostial lesion and this lesion.   3rd Mrg lesion is 75% stenosed.   1st Diag lesion is 50% stenosed.   Ost LAD to Mid LAD lesion is 25% stenosed.   Mid LAD lesion is 90% stenosed.   A drug-eluting stent was successfully placed using a SYNERGY  XD 3.0X16, postdilated to greater than 3.5 mm.   Post intervention, there is a 0% residual stenosis.   The left ventricular systolic function is normal.   LV end diastolic pressure is normal.   The left ventricular ejection fraction is 50-55% by visual estimate.   There is no aortic valve stenosis. 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  There is moderate diffuse disease in the proximal LAD.  The culprit lesion was a severe stenosis in the mid LAD.  This was successfully treated with a 3.0 x 16 Synergy drug-eluting stent, postdilated to greater than 3.5 mm in diameter. Resume IV heparin 2 hours after the TR band is removed.  Coumadin can also be resumed tonight.  Target INR greater than 2.  Aspirin will be used for 1 month and then stop.  Clopidogrel will be continued for ideally 12 months if no bleeding problems. Plan discussed with Dr. Burt Knack who is the primary cardiologist.  Would plan medical therapy for his residual disease.    Cardiac Studies     Patient Profile       76 y.o. male with multiple cardiovascular risk factors presenting with chest discomfort with both typical and atypical features, elevated troponin, and symptoms of heart   Assessment & Plan    1.NSTEMI - peak trop 359.  Echo with yperdynamic LV function with no regional wall motion abnormalities just a few months ago, now has inferior and apical wall motion abnormalities with reduced LVEF of 50 to 55% - cath : mid LAD 90%, D1 50%, OM3 75%, ostrial RCA 80% and mid 70%. Received DES to LAD. Plans for medical therapyt residual disease   - medical therapy with ASA 81, atorva 40, plraiv 75, imdur 30, toprol 100. With renal function avoid ACE/ARB at this time.  -  triple therapy asa/plavix/coumadin x 1 month then can stop asa.      2. Acute HFpEF - 05/2022 echo LVEF 50-55%, grade I dd, e/e' 17 - medical therapy with jardiance 10mg . On oral lasix 40mg  daily.  - LVEDP by cath 5 - aki, hold lasix today and start lower dose 20mg   on Tuesay.      3.  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  INR 2.2, can d/c heparin gtt.      4. 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 Likely will need structurual heart evaluation as outpatient.   5. AKI - hold lasix today, lower dose to 20mg  daily starting on Tuesday    Would be ok for discharge today, hold lasix Sun, Mon and start lower dose 20mg  on Tuesday.   For questions or updates, please contact [facility] Please consult [email or web address] for contact info under        Signed, Friday, MD  06/06/2022, 9:10 AM

## 2022-06-07 ENCOUNTER — Encounter (HOSPITAL_COMMUNITY): Payer: Self-pay | Admitting: Interventional Cardiology

## 2022-06-07 LAB — LIPOPROTEIN A (LPA): Lipoprotein (a): 312 nmol/L — ABNORMAL HIGH (ref <75.0)

## 2022-06-18 ENCOUNTER — Telehealth (HOSPITAL_COMMUNITY): Payer: Self-pay

## 2022-06-18 ENCOUNTER — Encounter (HOSPITAL_COMMUNITY): Payer: Self-pay

## 2022-06-18 ENCOUNTER — Ambulatory Visit (HOSPITAL_COMMUNITY)
Admit: 2022-06-18 | Discharge: 2022-06-18 | Disposition: A | Discharge status: Home / Self Care | Payer: Medicare Other | Attending: Cardiology | Admitting: Cardiology

## 2022-06-18 VITALS — BP 118/78 | HR 84 | Ht 73.0 in | Wt 205.2 lb

## 2022-06-18 DIAGNOSIS — I2510 Atherosclerotic heart disease of native coronary artery without angina pectoris: Secondary | ICD-10-CM | POA: Diagnosis not present

## 2022-06-18 DIAGNOSIS — Z7901 Long term (current) use of anticoagulants: Secondary | ICD-10-CM | POA: Insufficient documentation

## 2022-06-18 DIAGNOSIS — N1832 Chronic kidney disease, stage 3b: Secondary | ICD-10-CM | POA: Insufficient documentation

## 2022-06-18 DIAGNOSIS — Z8679 Personal history of other diseases of the circulatory system: Secondary | ICD-10-CM | POA: Diagnosis not present

## 2022-06-18 DIAGNOSIS — I350 Nonrheumatic aortic (valve) stenosis: Secondary | ICD-10-CM | POA: Diagnosis not present

## 2022-06-18 DIAGNOSIS — Z86718 Personal history of other venous thrombosis and embolism: Secondary | ICD-10-CM | POA: Diagnosis not present

## 2022-06-18 DIAGNOSIS — D6869 Other thrombophilia: Secondary | ICD-10-CM | POA: Diagnosis not present

## 2022-06-18 DIAGNOSIS — I214 Non-ST elevation (NSTEMI) myocardial infarction: Secondary | ICD-10-CM

## 2022-06-18 DIAGNOSIS — I110 Hypertensive heart disease with heart failure: Secondary | ICD-10-CM | POA: Diagnosis present

## 2022-06-18 DIAGNOSIS — I130 Hypertensive heart and chronic kidney disease with heart failure and stage 1 through stage 4 chronic kidney disease, or unspecified chronic kidney disease: Secondary | ICD-10-CM | POA: Diagnosis not present

## 2022-06-18 DIAGNOSIS — I5032 Chronic diastolic (congestive) heart failure: Secondary | ICD-10-CM | POA: Insufficient documentation

## 2022-06-18 DIAGNOSIS — Z9861 Coronary angioplasty status: Secondary | ICD-10-CM

## 2022-06-18 DIAGNOSIS — I7409 Other arterial embolism and thrombosis of abdominal aorta: Secondary | ICD-10-CM

## 2022-06-18 DIAGNOSIS — E785 Hyperlipidemia, unspecified: Secondary | ICD-10-CM | POA: Insufficient documentation

## 2022-06-18 DIAGNOSIS — N179 Acute kidney failure, unspecified: Secondary | ICD-10-CM

## 2022-06-18 DIAGNOSIS — I252 Old myocardial infarction: Secondary | ICD-10-CM | POA: Diagnosis not present

## 2022-06-18 LAB — BASIC METABOLIC PANEL
Anion gap: 10 (ref 5–15)
BUN: 15 mg/dL (ref 8–23)
CO2: 20 mmol/L — ABNORMAL LOW (ref 22–32)
Calcium: 9.3 mg/dL (ref 8.9–10.3)
Chloride: 104 mmol/L (ref 98–111)
Creatinine, Ser: 1.33 mg/dL — ABNORMAL HIGH (ref 0.61–1.24)
GFR, Estimated: 55 mL/min — ABNORMAL LOW (ref >60)
Glucose, Bld: 110 mg/dL — ABNORMAL HIGH (ref 70–99)
Potassium: 5.1 mmol/L (ref 3.5–5.1)
Sodium: 134 mmol/L — ABNORMAL LOW (ref 135–145)

## 2022-06-18 MED ORDER — SPIRONOLACTONE 25 MG PO TABS: 12.5000 mg | ORAL_TABLET | Freq: Every day | ORAL | 3 refills | Status: DC

## 2022-06-18 NOTE — Telephone Encounter (Signed)
Patient's med has been up dated to reflect this change.  Pt aware, agreeable, and verbalized understanding

## 2022-06-18 NOTE — Progress Notes (Signed)
HEART & VASCULAR TRANSITION OF CARE CONSULT NOTE     Referring Physician: Dr. Conan Bowens  Primary Care: [name], MD Primary Cardiologist: Thurmon Fair, MD  HPI: Referred to clinic by Dr. Jomarie Longs for heart failure consultation.   76 y/o male w/ h/o severe PAD with distal aortic occlusion, bilateral RAS, abdominal aortic aneurysm, hypercoagulable disorder w/ h/o recurrent DVTs now on chronic anticoagulation therapy w/ coumadin, HTN, HLD and aortic stenosis (mod on echo 06/2001 w/ mGradient 19 mmHg, AVR 1.15 cm2, LVEF 60-65%).   Recent admission 8/23 for CP. Ruled in for NSTEMi and found to be in acute CHF. Hs trop 37>>157>>359. BNP 1,245. CXR w/ pulmonary edema. Echo w/ slight drop in EF down to 50-55% and mod-severe AS, mGradient 36 mmHg. 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 There was moderate diffuse disease in the proximal LAD.  The culprit lesion was a severe stenosis in the mid LAD.  This was successfully treated with a 3.0 x 16 Synergy drug-eluting stent, postdilated to greater than 3.5 mm in diameter. Medical therapy recommended for residual disease in RCA, 1st diag and 3rd Mrg branch (See full angiographic details below). LVEDP 5 mmHg. Developed mild AKI w/ diuresis, w/ bump in SCr to 1.7 (b/l 1.2). Lasix held but ultimately restarted at d/c.   He was placed on triple therapy w/ asa/plavix/coumadin w/ plans to stop ASA after 30 days, Atorva 40, Imdur 30, Toprol XL 100 and Lasix 20 mg daily. Hospital d/c wt 206 lb.   He has been referred to [facility] clinic. Presents today for f/u. Doing well. No CP. Able to do most of ADLs w/o significant dyspnea, NYHA Class ll. Wt stable since d/c, 205 lb today. Denies LEE. No orthopnea/PND. Fully compliance w/medications, though has not yet taken his AM meds. BP 118/78. Denies abnormal bleeding w/ triple therapy.     Cardiac Testing   2D Echo 06/03/22 Left ventricular ejection  fraction, by estimation, is 50 to 55%. The left ventricle has low normal function. The left ventricle demonstrates regional wall motion abnormalities (see scoring diagram/findings for description). Left ventricular diastolic parameters are consistent with Grade I diastolic dysfunction (impaired relaxation). Elevated left ventricular end-diastolic pressure. The E/e' is 54. There is severe hypokinesis of the left ventricular, mid-apical inferior wall, inferoseptal wall and apical segment. The apex appears anerusymal. 1. 2. The mitral valve is abnormal. Trivial mitral valve regurgitation. The aortic valve is calcified. There is severe calcifcation of the aortic valve. Moderate to severe aortic valve stenosis. Aortic valve area, by VTI measures 0.48 cm. Aortic valve mean gradient measures 36.0 mmHg. Aortic valve Vmax measures 3.57 m/s. Peak gradient 51 mmHg, DI 0.21. 3. 4. There is normal pulmonary artery systolic pressure. The inferior vena cava is normal in size with greater than 50% respiratory variability, suggesting right atrial pressure of 3 mmHg.  LHC 06/04/22    Ost RCA to Prox RCA lesion is 80% stenosed.  Heavily calcified.  Would require atherectomy to fix.   Prox RCA lesion is 25% stenosed.   Mid RCA lesion is 70% stenosed.  Diffuse disease between the ostial lesion and this lesion.   3rd Mrg lesion is 75% stenosed.   1st Diag lesion is 50% stenosed.   Ost LAD to Mid LAD lesion is 25% stenosed.   Mid LAD lesion is 90% stenosed.   A drug-eluting stent was successfully placed using a SYNERGY XD 3.0X16, postdilated to greater  than 3.5 mm.   Post intervention, there is a 0% residual stenosis.   The left ventricular systolic function is normal.   LV end diastolic pressure is normal.   The left ventricular ejection fraction is 50-55% by visual estimate.   There is no aortic valve stenosis.   Diagnostic Dominance: Co-dominant  Intervention     Review of Systems: [y] = yes,  [ ]  = no   General: Weight gain [ ] ; Weight loss [ ] ; Anorexia [ ] ; Fatigue [ ] ; Fever [ ] ; Chills [ ] ; Weakness [ ]   Cardiac: Chest pain/pressure [ ] ; Resting SOB [ ] ; Exertional SOB [ ] ; Orthopnea [ ] ; Pedal Edema [ ] ; Palpitations [ ] ; Syncope [ ] ; Presyncope [ ] ; Paroxysmal nocturnal dyspnea[ ]   Pulmonary: Cough [ ] ; Wheezing[ ] ; Hemoptysis[ ] ; Sputum [ ] ; Snoring [ ]   GI: Vomiting[ ] ; Dysphagia[ ] ; Melena[ ] ; Hematochezia [ ] ; Heartburn[ ] ; Abdominal pain [ ] ; Constipation [ ] ; Diarrhea [ ] ; BRBPR [ ]   GU: Hematuria[ ] ; Dysuria [ ] ; Nocturia[ ]   Vascular: Pain in legs with walking [ ] ; Pain in feet with lying flat [ ] ; Non-healing sores [ ] ; Stroke [ ] ; TIA [ ] ; Slurred speech [ ] ;  Neuro: Headaches[ ] ; Vertigo[ ] ; Seizures[ ] ; Paresthesias[ ] ;Blurred vision [ ] ; Diplopia [ ] ; Vision changes [ ]   Ortho/Skin: Arthritis [ ] ; Joint pain [ ] ; Muscle pain [ ] ; Joint swelling [ ] ; Back Pain [ ] ; Rash [ ]   Psych: Depression[ ] ; Anxiety[ ]   Heme: Bleeding problems [ ] ; Clotting disorders [ ] ; Anemia [ ]   Endocrine: Diabetes [ ] ; Thyroid dysfunction[ ]    Past Medical History:  Diagnosis Date   AAA (abdominal aortic aneurysm) (HCC)    Blood dyscrasia    followed by Dr. Benay Spice, for clotting issue, has been on Coumadin for about 20 yrs.     COPD (chronic obstructive pulmonary disease) (HCC)    DJD (degenerative joint disease)    Dyslipidemia    Gout    Malignant hypertension    Peripheral vascular disease ([place])    Pneumonia 08/2013   pt. reports that he is having this surgery 11/25/2014- for the lung problem that began with pneumonia in 09/2014   Shortness of breath    Venous thrombosis    Recurrent    Current Outpatient Medications  Medication Sig Dispense Refill   albuterol (VENTOLIN HFA) 108 (90 Base) MCG/ACT inhaler Inhale 2 puffs into the lungs 5 (five) times daily.     allopurinol (ZYLOPRIM) 300 MG tablet Take 300 mg by mouth at bedtime.     atorvastatin (LIPITOR) 40 MG tablet  Take 1 tablet (40 mg total) by mouth daily. 30 tablet 0   cilostazol (PLETAL) 100 MG tablet Take 1 tablet (100 mg total) by mouth 2 (two) times daily. Please schedule appointment for refills. (Patient taking differently: Take 100 mg by mouth 2 (two) times daily.) 180 tablet 0   clopidogrel (PLAVIX) 75 MG tablet Take 1 tablet (75 mg total) by mouth daily. 30 tablet 0   empagliflozin (JARDIANCE) 10 MG TABS tablet Take 1 tablet (10 mg total) by mouth daily. 30 tablet 0   furosemide (LASIX) 20 MG tablet Take 1 tablet (20 mg total) by mouth daily. Start on Tuesday 8/22 30 tablet 0   metoprolol succinate (TOPROL-XL) 100 MG 24 hr tablet Take 100 mg by mouth daily.      omeprazole (PRILOSEC) 40 MG capsule Take 40 mg by mouth  at bedtime.     ondansetron (ZOFRAN) 8 MG tablet Take 8 mg by mouth 2 (two) times daily as needed for vomiting or nausea.     spironolactone (ALDACTONE) 25 MG tablet Take 1 tablet (25 mg total) by mouth daily. 30 tablet 0   tamsulosin (FLOMAX) 0.4 MG CAPS capsule Take 0.4 mg by mouth every evening.     warfarin (COUMADIN) 5 MG tablet Take 5 mg by mouth See admin instructions. Take 1 tablet (5 mg) on Tuesday, Wednesday, Thursday, Saturday, Sunday. On Monday and Friday do not take. Or as directed by Coumadin clinic.     aspirin 81 MG chewable tablet Chew 1 tablet (81 mg total) by mouth daily. (Patient not taking: Reported on 06/18/2022) 30 tablet 0   cyclobenzaprine (FLEXERIL) 10 MG tablet Take 10 mg by mouth 3 (three) times daily as needed for muscle spasms. (Patient not taking: Reported on 06/18/2022)     famotidine (PEPCID) 20 MG tablet Take 1 tablet (20 mg total) by mouth daily. (Patient not taking: Reported on 06/18/2022) 30 tablet 0   Fenofibrate 120 MG TABS Take 120 mg by mouth daily.  (Patient not taking: Reported on 06/18/2022)     naloxone (NARCAN) nasal spray 4 mg/0.1 mL Place 4 mg into the nose as needed. (Patient not taking: Reported on 06/18/2022)     oxyCODONE (ROXICODONE) 15 MG  immediate release tablet Take 15 mg by mouth 4 (four) times daily as needed for pain. (Patient not taking: Reported on 06/18/2022)     No current facility-administered medications for this encounter.    Allergies  Allergen Reactions   Codeine Hives and Other (See Comments)    Takes benadryl to stop allergic reactions      Social History   Socioeconomic History   Marital status: Widowed    Spouse name: Not on file   Number of children: 0   Years of education: Not on file   Highest education level: 6th grade  Occupational History    Comment: semi retired  Tobacco Use   Smoking status: Former    Types: Cigarettes    Quit date: 10/18/1992    Years since quitting: 29.6   Smokeless tobacco: Never  Vaping Use   Vaping Use: Never used  Substance and Sexual Activity   Alcohol use: No   Drug use: No   Sexual activity: Not on file  Other Topics Concern   Not on file  Social History Narrative   Not on file   Social Determinants of Health   Financial Resource Strain: Low Risk  (06/04/2022)   Overall Financial Resource Strain (CARDIA)    Difficulty of Paying Living Expenses: Not hard at all  Food Insecurity: No Food Insecurity (06/04/2022)   Hunger Vital Sign    Worried About Running Out of Food in the Last Year: Never true    [place] in the Last Year: Never true  Transportation Needs: No Transportation Needs (06/04/2022)   PRAPARE - Hydrologist (Medical): No    Lack of Transportation (Non-Medical): No  Physical Activity: Not on file  Stress: Not on file  Social Connections: Not on file  Intimate Partner Violence: Not on file      Family History  Problem Relation Age of Onset   CAD Father 47   Liver cancer Brother    CAD Sister     Vitals:   06/18/22 1000  BP: 118/78  Pulse: 84  SpO2: 95%  Weight: 93.1 kg (205 lb 3.2 oz)  Height: 6\' 1"  (1.854 m)    PHYSICAL EXAM: General:  Well appearing. No respiratory difficulty HEENT:  normal Neck: supple. no JVD. Carotids 2+ bilat; no bruits. No lymphadenopathy or thryomegaly appreciated. Cor: PMI nondisplaced. Regular rate & rhythm. 2/6 AS murmur RUSB  Lungs: clear Abdomen: obese, soft, nontender, nondistended. No hepatosplenomegaly. No bruits or masses. Good bowel sounds. Extremities: no cyanosis, clubbing, rash, edema Neuro: alert & oriented x 3, cranial nerves grossly intact. moves all 4 extremities w/o difficulty. Affect pleasant.  ECG: Not performed    ASSESSMENT & PLAN:  Chronic Diastolic Heart Failure - recent acute CHF w/ BNP 1,245 and CXR w/ pulmonary edema >>diuresed w/ IV Lasix>> LVEDP 5 mmHg on cath  - Echo 8/23 EF 50-55%, RV normal  - Euvolemic on exam today w/ improvement in functional class, NYHA Class II - continue Jardiance 10 mg daily  - continue spironlactone 25 mg daily  - continue Toprol XL 100 mg daily  - avoiding addition of ARNi/ARB for now given recent AKI/ known h/o b/l RAS and soft BP in the setting of mod-severe AS  - continue Lasix 20 mg daily  - check BMP today  - educated on importance of low sodium diet and fluid restriction    2. CAD s/p Recent NSTEMI  - culprit for NSTEMI 90% mid LAD, treated w/ PCI + DES - residual 80% pRCA, 70% mid RCA, 75% 3rd Mrg lesion and 50% p1st Diag managed medically  - stable w/o CP  - continue triple therapy w/ ASA/Plavix/Coumadin for total of 30 days. Can d/c ASA after 9/18  - continue atorvastatin 40 mg, needs aggressive lipid lowering management (see below) - continue Toprol XL 100 mg daily  - continue spironolactone 25 mg daily   3. Aortic Stenosis  - mod-severe on echo 8/23, mGradient 36 mmHg - currently asymptomatic, needs surveillance echos  - Cardiology to follow. Consider Structural Heart Clinic Referral, though severe PAD may limit vascular access for TAVR  - avoid hypotension. No med titration made today   4. HLD LDL, Goal < 55 mgHg  - LDL elevated at 85 mmHg on most recent LP  -  TG improved w/ fenofibrate, 298>>97 - continue atorva 40 mg + fenofibrate - cardiology to follow LP. Recommend recheck in 6 weeks and titration of atorva +/- addition of Zetia if not a goal - may also consider PCSK9i therapy   5. HTN - controlled on current regimen  - GDMT per above   6. Stage IIIb CKD - recent AKI after diuresis, SCr b/l 1.2>>1.7  - Check BMP today   6. AAA - known occlusion of terminal abdominal aorta and bilateral renal artery stenosis - denies recent claudication  - no longer smoking - continue Plavix and statin  - on Pletal   7. Hypercoagulable Disorder w/ H/o Recurrent DVTs - now on lifelong a/c therapy w/ coumadin  NYHA II GDMT  Diuretic- Lasix 40 mg daily  BB- Toprol XL 100 mg daily  Ace/ARB/ARNI - no CKD/ B/l RAS  MRA Spironlactone 25 mg daily  SGLT2i Jardiance 10 mg daily     Referred to HFSW (PCP, Medications, Transportation, ETOH Abuse, Drug Abuse, Insurance, 9/23 ):  No Refer to Pharmacy: No Refer to Home Health:  No Refer to Advanced Heart Failure Clinic: Yes Refer to General Cardiology: Yes (continued care by Dr. Surveyor, quantity)  Follow up  Keep scheduled f/u w/ cardiology in 4 weeks. Has appt w/  Dr. Sallyanne Kuster on 10/5.

## 2022-06-18 NOTE — Telephone Encounter (Signed)
-----   Message from Allayne Butcher, [place] sent at 06/18/2022  2:01 PM EDT ----- Renal function improved, K upper limits of normal at 5.1. Reduce spironolactone to 12.5 mg

## 2022-06-18 NOTE — Patient Instructions (Signed)
No change in medications.  Keep follow up with Cardiology in 4 weeks.

## 2022-06-24 ENCOUNTER — Telehealth (HOSPITAL_COMMUNITY): Payer: Self-pay

## 2022-06-24 NOTE — Telephone Encounter (Signed)
Pt is not interested in the cardiac rehab program. Closed referral 

## 2022-07-22 ENCOUNTER — Encounter: Payer: Self-pay | Admitting: Cardiovascular Disease

## 2022-07-22 ENCOUNTER — Ambulatory Visit: Payer: Medicare Other | Attending: Cardiovascular Disease | Admitting: Cardiovascular Disease

## 2022-07-22 VITALS — BP 188/89 | HR 83 | Ht 73.0 in | Wt 208.6 lb

## 2022-07-22 DIAGNOSIS — I739 Peripheral vascular disease, unspecified: Secondary | ICD-10-CM

## 2022-07-22 DIAGNOSIS — I25110 Atherosclerotic heart disease of native coronary artery with unstable angina pectoris: Secondary | ICD-10-CM | POA: Diagnosis not present

## 2022-07-22 DIAGNOSIS — I7409 Other arterial embolism and thrombosis of abdominal aorta: Secondary | ICD-10-CM | POA: Diagnosis not present

## 2022-07-22 DIAGNOSIS — I5032 Chronic diastolic (congestive) heart failure: Secondary | ICD-10-CM

## 2022-07-22 DIAGNOSIS — I701 Atherosclerosis of renal artery: Secondary | ICD-10-CM

## 2022-07-22 DIAGNOSIS — Z86711 Personal history of pulmonary embolism: Secondary | ICD-10-CM

## 2022-07-22 DIAGNOSIS — I350 Nonrheumatic aortic (valve) stenosis: Secondary | ICD-10-CM

## 2022-07-22 DIAGNOSIS — E785 Hyperlipidemia, unspecified: Secondary | ICD-10-CM

## 2022-07-22 DIAGNOSIS — I7141 Pararenal abdominal aortic aneurysm, without rupture: Secondary | ICD-10-CM

## 2022-07-22 DIAGNOSIS — I150 Renovascular hypertension: Secondary | ICD-10-CM

## 2022-07-22 DIAGNOSIS — Z8739 Personal history of other diseases of the musculoskeletal system and connective tissue: Secondary | ICD-10-CM

## 2022-07-22 MED ORDER — TRAMADOL HCL 50 MG PO TABS
50.0000 mg | ORAL_TABLET | Freq: Three times a day (TID) | ORAL | 3 refills | Status: DC | PRN
Start: 2022-07-22 — End: 2022-07-30

## 2022-07-22 MED ORDER — ATORVASTATIN CALCIUM 80 MG PO TABS: 80.0000 mg | ORAL_TABLET | Freq: Every day | ORAL | 3 refills | Status: DC

## 2022-07-22 MED ORDER — LOSARTAN POTASSIUM 50 MG PO TABS: 50.0000 mg | ORAL_TABLET | Freq: Every day | ORAL | 1 refills | Status: DC

## 2022-07-22 NOTE — Patient Instructions (Addendum)
Medication Instructions:  INCREASE the Atorvastatin to 80 mg once daily START Losartan 50 mg once daily  TAKE Tramadol 50 mg every 8 hours as needed. Avoid using ibuprofen (Advil, Motrin)  *If you need a refill on your cardiac medications before your next appointment, please call your pharmacy*   Lab Work: None ordered If you have labs (blood work) drawn today and your tests are completely normal, you will receive your results only by: [employer] (if you have MyChart) OR A paper copy in the mail If you have any lab test that is abnormal or we need to change your treatment, we will call you to review the results.   Testing/Procedures: None ordered   Follow-Up: At [facility], you and your health needs are our priority.  As part of our continuing mission to provide you with exceptional heart care, we have created designated Provider Care Teams.  These Care Teams include your primary Cardiologist (physician) and Advanced Practice Providers (APPs -  Physician Assistants and Nurse Practitioners) who all work together to provide you with the care you need, when you need it.  We recommend signing up for the patient portal called "MyChart".  Sign up information is provided on this After Visit Summary.  MyChart is used to connect with patients for Virtual Visits (Telemedicine).  Patients are able to view lab/test results, encounter notes, upcoming appointments, etc.  Non-urgent messages can be sent to your provider as well.   To learn more about what you can do with MyChart, go to [email or web address].    Your next appointment:   Follow up in one month

## 2022-07-22 NOTE — Progress Notes (Signed)
Cardiology Office Note:    Date:  07/22/2022   ID:  Tony Zhang, DOB 1946/03/01, MRN [id number]  PCP:  Tamsen Roers, MD   [place] Prov[facility] Cardiologist:  Sanda Klein, MD     Referring MD: Tamsen Roers, MD   Chief Complaint  Patient presents with   Coronary Artery Disease   PAD   Aortic Stenosis    History of Present Illness:    Tony Zhang is a 76 y.o. 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  He is on antiplatelet therapy with clopidogrel but aspirin has been stopped since he also takes warfarin for venous thromboembolic disease.  Since Hospital charge has not had problems of shortness of breath or chest pain either at rest or with activity.  He denies abdominal pain or intermittent claudication.  He has not had any bleeding problems, falls or injuries.  He has been taking Advil for a variety of aches and pains.  He is on Jardiance but has not had any problems with intertrigo or perineal inflammation.  Denies palpitations, dizziness or syncope.  No focal neurological complaints.  His blood pressure is high and is higher in the right arm by about 20 mmHg compared to the left arm (188/89 versus 169/85).  Had some issues with AKI during the recent hospitalization which have resolved.  In the past he was taking losartan hydrochlorothiazide 100/25 mg daily in addition to the beta-blocker.  Past Medical History:  Diagnosis Date    AAA (abdominal aortic aneurysm) (HCC)    Blood dyscrasia    followed by Dr. Benay Spice, for clotting issue, has been on Coumadin for about 20 yrs.     COPD (chronic obstructive pulmonary disease) (HCC)    DJD (degenerative joint disease)    Dyslipidemia    Gout    Malignant hypertension    Peripheral vascular disease ([place])    Pneumonia 08/2013   pt. reports that he is having this surgery 11/25/2014- for the lung problem that began with pneumonia in 09/2014   Shortness of breath    Venous thrombosis    Recurrent    Past Surgical History:  Procedure Laterality Date   APPENDECTOMY     BACK SURGERY     COLONOSCOPY N/A 04/13/2015   Procedure: COLONOSCOPY;  Surgeon: Laurence Spates, MD;  Location: [facility] ENDOSCOPY;  Service: Endoscopy;  Laterality: N/A;   CORONARY STENT INTERVENTION N/A 06/04/2022   Procedure: CORONARY STENT INTERVENTION;  Surgeon: Jettie Booze, MD;  Location: [place] CV LAB;  Service: Cardiovascular;  Laterality: N/A;   EMPYEMA DRAINAGE N/A 11/25/2014   Procedure: EMPYEMA DRAINAGE;  Surgeon: Grace Isaac, MD;  Location: [place];  Service: Thoracic;  Laterality: N/A;   ESOPHAGOGASTRODUODENOSCOPY N/A 04/10/2015   Procedure: ESOPHAGOGASTRODUODENOSCOPY (EGD);  Surgeon: Wilford Corner, MD;  Location: [facility] ENDOSCOPY;  Service: Endoscopy;  Laterality: N/A;   EYE SURGERY     both eyes, cataracts removed, denies lens implants   FLEXIBLE SIGMOIDOSCOPY N/A 04/12/2015   Procedure: FLEXIBLE SIGMOIDOSCOPY;  Surgeon: Laurence Spates, MD;  Location:  [place] ENDOSCOPY;  Service: Endoscopy;  Laterality: N/A;   HERNIA REPAIR     LEFT HEART CATH AND CORONARY ANGIOGRAPHY N/A 06/04/2022   Procedure: LEFT HEART CATH AND CORONARY ANGIOGRAPHY;  Surgeon: Jettie Booze, MD;  Location: [place] CV LAB;  Service: Cardiovascular;  Laterality: N/A;   SHOULDER SURGERY Right    VIDEO ASSISTED THORACOSCOPY Right 11/25/2014   Procedure: VIDEO ASSISTED THORACOSCOPY;  Surgeon: Grace Isaac, MD;   Location: [place];  Service: Thoracic;  Laterality: Right;   VIDEO BRONCHOSCOPY N/A 11/25/2014   Procedure: VIDEO BRONCHOSCOPY;  Surgeon: Grace Isaac, MD;  Location: [facility] OR;  Service: Thoracic;  Laterality: N/A;    Current Medications: Current Meds  Medication Sig   albuterol (VENTOLIN HFA) 108 (90 Base) MCG/ACT inhaler Inhale 2 puffs into the lungs 5 (five) times daily.   allopurinol (ZYLOPRIM) 300 MG tablet Take 300 mg by mouth at bedtime.   cilostazol (PLETAL) 100 MG tablet Take 1 tablet (100 mg total) by mouth 2 (two) times daily. Please schedule appointment for refills. (Patient taking differently: Take 100 mg by mouth 2 (two) times daily.)   clopidogrel (PLAVIX) 75 MG tablet Take 1 tablet (75 mg total) by mouth daily.   diazepam (VALIUM) 2 MG tablet Take 2 mg by mouth in the morning and at bedtime.   empagliflozin (JARDIANCE) 10 MG TABS tablet Take 1 tablet (10 mg total) by mouth daily.   furosemide (LASIX) 20 MG tablet Take 1 tablet (20 mg total) by mouth daily. Start on Tuesday 8/22   losartan (COZAAR) 50 MG tablet Take 1 tablet (50 mg total) by mouth daily.   metoprolol succinate (TOPROL-XL) 100 MG 24 hr tablet Take 100 mg by mouth daily.    omeprazole (PRILOSEC) 40 MG capsule Take 40 mg by mouth at bedtime.   ondansetron (ZOFRAN) 8 MG tablet Take 8 mg by mouth 2 (two) times daily as needed for vomiting or nausea.   spironolactone (ALDACTONE) 25 MG tablet Take 0.5 tablets (12.5 mg total) by mouth daily.   tamsulosin (FLOMAX) 0.4 MG CAPS capsule Take 0.4 mg by mouth every evening.   traMADol (ULTRAM) 50 MG tablet Take 1 tablet (50 mg total) by mouth every 8 (eight) hours as needed for severe pain.   warfarin (COUMADIN) 5 MG tablet Take 5 mg by mouth See admin instructions. Take 1 tablet (5 mg) on Tuesday, Wednesday, Thursday, Saturday, Sunday. On Monday and Friday do not take. Or as directed by Coumadin clinic.   [DISCONTINUED] atorvastatin (LIPITOR) 40 MG tablet Take 1 tablet (40 mg  total) by mouth daily.     Allergies:   Codeine   Social History   Socioeconomic History   Marital status: Widowed    Spouse name: Not on file   Number of children: 0   Years of education: Not on file   Highest education level: 6th grade  Occupational History    Comment: semi retired  Tobacco Use   Smoking status: Former    Types: Cigarettes    Quit date: 10/18/1992    Years since quitting: 29.7   Smokeless tobacco: Never  Vaping Use   Vaping Use: Never used  Substance and Sexual Activity   Alcohol use: No   Drug use: No   Sexual activity: Not on file  Other Topics Concern   Not on file  Social History Narrative   Not on file   Social Determinants of Health   Financial Resource Strain: Low Risk  (06/04/2022)  Overall Financial Resource Strain (CARDIA)    Difficulty of Paying Living Expenses: Not hard at all  Food Insecurity: No Food Insecurity (06/04/2022)   Hunger Vital Sign    Worried About Running Out of Food in the Last Year: Never true    Ran Out of Food in the Last Year: Never true  Transportation Needs: No Transportation Needs (06/04/2022)   PRAPARE - Hydrologist (Medical): No    Lack of Transportation (Non-Medical): No  Physical Activity: Not on file  Stress: Not on file  Social Connections: Not on file     Family History: The patient's family history includes CAD in his sister; CAD (age of onset: 59) in his father; Liver cancer in his brother.  ROS:   Please see the history of present illness.     All other systems reviewed and are negative.  EKGs/Labs/Other Studies Reviewed:    The following studies were reviewed today: Cardiac Cath 06/04/2022    Ost RCA to Prox RCA lesion is 80% stenosed.  Heavily calcified.  Would require atherectomy to fix.   Prox RCA lesion is 25% stenosed.   Mid RCA lesion is 70% stenosed.  Diffuse disease between the ostial lesion and this lesion.   3rd Mrg lesion is 75% stenosed.   1st Diag  lesion is 50% stenosed.   Ost LAD to Mid LAD lesion is 25% stenosed.   Mid LAD lesion is 90% stenosed.   A drug-eluting stent was successfully placed using a SYNERGY XD 3.0X16, postdilated to greater than 3.5 mm.   Post intervention, there is a 0% residual stenosis.   The left ventricular systolic function is normal.   LV end diastolic pressure is normal.   The left ventricular ejection fraction is 50-55% by visual estimate.   There is no aortic valve stenosis.   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  There is moderate diffuse disease in the proximal LAD.  The culprit lesion was a severe stenosis in the mid LAD.  This was successfully treated with a 3.0 x 16 Synergy drug-eluting stent, postdilated to greater than 3.5 mm in diameter.   Resume IV heparin 2 hours after the TR band is removed.  Coumadin can also be resumed tonight.  Target INR greater than 2.  Aspirin will be used for 1 month and then stop.  Clopidogrel will be continued for ideally 12 months if no bleeding problems.   Plan discussed with Dr. Burt Knack who is the primary cardiologist.  Would plan medical therapy for his residual disease.    Diagnostic Dominance: Co-dominant  Intervention    Permanent Stent  Synergy Xd 3.0x16    Echocardiogram 06/03/2022    1. Left ventricular ejection fraction, by estimation, is 50 to 55%. The  left ventricle has low normal function. The left ventricle demonstrates  regional wall motion abnormalities (see scoring diagram/findings for  description). Left ventricular diastolic   parameters are consistent with Grade I diastolic dysfunction (impaired  relaxation). Elevated left ventricular end-diastolic pressure. The E/e' is  56. There is severe hypokinesis of the left ventricular, mid-apical  inferior wall, inferoseptal wall and  apical segment. The apex appears anerusymal.   2. The mitral valve is abnormal. Trivial mitral valve regurgitation.   3. The aortic valve is calcified.  There is severe calcifcation of the  aortic valve. Moderate to severe aortic valve stenosis. Aortic valve area,  by VTI measures 0.48 cm. Aortic valve mean gradient measures 36.0 mmHg.  Aortic valve Vmax measures 3.57  m/s. Peak gradient 51 mmHg, DI 0.21.   4. There is normal pulmonary artery systolic pressure.   5. The inferior vena cava is normal in size with greater than 50%  respiratory variability, suggesting right atrial pressure of 3 mmHg.   Comparison(s): Prior images reviewed side by side. Changes from prior  study are noted. 02/22/2022: LVEF >75%, mild (probably moderate) AS - mean  gradient 16 mmHg, DI 0.33.   AORTIC VALVE  AV Area (Vmax):    0.47 cm  AV Area (Vmean):   0.46 cm  AV Area (VTI):     0.48 cm  AV Vmax:           357.00 cm/s  AV Vmean:          292.000 cm/s  AV VTI:            0.898 m  AV Peak Grad:      51.0 mmHg  AV Mean Grad:      36.0 mmHg  LVOT Vmax:         74.20 cm/s  LVOT Vmean:        59.800 cm/s  LVOT VTI:          0.189 m  LVOT/AV VTI ratio: 0.21    EKG:  EKG is ordered today.  The ekg ordered today demonstrates NSR, old inferior MI, QS V1-V4 c/w anterior infarction, left axis deviation, QTc 408 ms  Recent Labs: 06/02/2022: ALT 10; B Natriuretic Peptide 1,245.2; TSH 0.818 06/06/2022: Hemoglobin 15.2; Platelets 251 06/18/2022: BUN 15; Creatinine, Ser 1.33; Potassium 5.1; Sodium 134  Recent Lipid Panel    Component Value Date/Time   CHOL 140 06/03/2022 0257   CHOL 129 07/09/2019 1014   TRIG 97 06/03/2022 0257   HDL 36 (L) 06/03/2022 0257   HDL 30 (L) 07/09/2019 1014   CHOLHDL 3.9 06/03/2022 0257   VLDL 19 06/03/2022 0257   LDLCALC 85 06/03/2022 0257   LDLCALC 64 07/09/2019 1014     Risk Assessment/Calculations:      HYPERTENSION CONTROL Vitals:   07/22/22 0826 07/22/22 0908  BP: (!) 169/85 (!) 188/89    The patient's blood pressure is elevated above target today.  In order to address the patient's elevated BP: A current  anti-hypertensive medication was adjusted today.            Physical Exam:    VS:  BP (!) 188/89 (BP Location: Right Arm)   Pulse 83   Ht 6\' 1"  (1.854 m)   Wt 208 lb 9.6 oz (94.6 kg)   SpO2 96%   BMI 27.52 kg/m     Wt Readings from Last 3 Encounters:  07/22/22 208 lb 9.6 oz (94.6 kg)  06/18/22 205 lb 3.2 oz (93.1 kg)  06/03/22 206 lb (93.4 kg)     GEN:  Well nourished, well developed in no acute distress HEENT: Normal NECK: No JVD; bilateral carotid bruits LYMPHATICS: No lymphadenopathy CARDIAC: RRR, mid peaking 2/6 aortic ejection murmur radiating towards the carotids and the subclavians, no diastolic murmurs, rubs, gallops RESPIRATORY:  Clear to auscultation without rales, wheezing or rhonchi  ABDOMEN: Soft, non-tender, non-distended MUSCULOSKELETAL:  No edema; No deformity  SKIN: Warm and dry NEUROLOGIC:  Alert and oriented x 3 PSYCHIATRIC:  Normal affect   ASSESSMENT:    1. Aortic stenosis, moderate   2. Coronary artery disease involving native coronary artery of native heart with unstable angina pectoris ([place])   3. PAD (peripheral  artery disease) ([place])   4. Chronic distal aortic occlusion (HCC)   5. 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 Chronic diastolic heart failure, NYHA class 2 ([place])   7. Bilateral renal artery stenosis (HCC)   8. Renovascular hypertension   9. Dyslipidemia (high LDL; low HDL)   10. History of pulmonary embolism   11. History of gout    PLAN:    In order of problems listed above:  AS: Has at least moderate aortic stenosis, based on dimensionless index and calculated valve area he has severe arctic stenosis.  Thankfully he does not have exertional angina, dyspnea or syncope.  It is very likely that he will require aortic valve replacement in the next several months at most a year.  He is very reluctant to consider surgical AVR, since he lives alone and does not really have a support system.  Femoral access for  TAVR is not an option.  We will require a CT angiogram of the chest and abdomen to look for alternative axis.  The subclavian or carotid arteries.  Note that he has a different blood pressure in the right and left upper extremity suggesting that he has left subclavian stenosis.  In addition he has a ostial right coronary artery stenosis that is heavily calcified and would require atherectomy.  It may end up that he is in a better candidate for surgical AVR.  We will need to defer TAVR until a minimum of 3 months from his recently placed LAD artery stent, preferably 6 months since it was placed in the setting of an acute coronary syndrome.  Thankfully, he remains asymptomatic at this time. CAD: Denies any angina.  Had a drug-eluting stent placed for ACS in the LAD artery in late August 2023.  Avoid any interruption in antiplatelet therapy until late November 2023, preferably until late February 2024.  Not on aspirin due to concomitant anticoagulation with warfarin.  On clopidogrel monotherapy. PAD: Longstanding history of occlusion of the distal abdominal aorta with reconstitution at the level of the common femoral arteries via inferior epigastric arteries.  Based on previous imaging studies does not have a whole lot of disease in the infrapopliteal distribution.  He does not have claudication. 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  They both are appropriate for routine imaging follow-up on a yearly basis CHF: He appears euvolemic and NYHA functional class I on a very small dose of loop diuretics plus Jardiance and spironolactone. HTN: May be related to renal artery stenosis.  Blood pressure is quite high.  Resume losartan.  He may have had progression of renal artery stenosis.  We will reevaluate this when we do CT angiography as part of his evaluation for possible TAVR. HLP: Target LDL less  than 70.  Most recent LDL cholesterol was 85 on 06/03/2022 and his HDL is low.  Increase atorvastatin to 80 mg daily due to recent acute coronary syndrome, anyway.  Triglycerides were elevated in the past, but most recently were normal.  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  May be a candidate for inclisiran. History of DVT/PE: Incidentally noted to have a persistent left-sided inferior vena cava.  On chronic warfarin for over 20 years.  Was seeing Dr. Benay Spice in the hematology clinic for hypercoagulable state.  INR is monitored in Dr. Eddie Dibbles office.  Avoid NSAIDs.  Prefer acetaminophen or  tramadol.  He prefers not to take oxycodone since this makes him feel unwell History of gout           Medication Adjustments/Labs and Tests Ordered: Current medicines are reviewed at length with the patient today.  Concerns regarding medicines are outlined above.  Orders Placed This Encounter  Procedures   EKG 12-Lead   Meds ordered this encounter  Medications   atorvastatin (LIPITOR) 80 MG tablet    Sig: Take 1 tablet (80 mg total) by mouth daily.    Dispense:  90 tablet    Refill:  3   traMADol (ULTRAM) 50 MG tablet    Sig: Take 1 tablet (50 mg total) by mouth every 8 (eight) hours as needed for severe pain.    Dispense:  60 tablet    Refill:  3   losartan (COZAAR) 50 MG tablet    Sig: Take 1 tablet (50 mg total) by mouth daily.    Dispense:  30 tablet    Refill:  1    Patient Instructions  Medication Instructions:  INCREASE the Atorvastatin to 80 mg once daily START Losartan 50 mg once daily  TAKE Tramadol 50 mg every 8 hours as needed. Avoid using ibuprofen (Advil, Motrin)  *If you need a refill on your cardiac medications before your next appointment, please call your pharmacy*   Lab Work: None ordered If you have labs (blood work) drawn today and your tests are completely normal, you will receive your results only by: [place] (if you have MyChart) OR A paper  copy in the mail If you have any lab test that is abnormal or we need to change your treatment, we will call you to review the results.   Testing/Procedures: None ordered   Follow-Up: At [facility], you and your health needs are our priority.  As part of our continuing mission to provide you with exceptional heart care, we have created designated Provider Care Teams.  These Care Teams include your primary Cardiologist (physician) and Advanced Practice Providers (APPs -  Physician Assistants and Nurse Practitioners) who all work together to provide you with the care you need, when you need it.  We recommend signing up for the patient portal called "MyChart".  Sign up information is provided on this After Visit Summary.  MyChart is used to connect with patients for Virtual Visits (Telemedicine).  Patients are able to view lab/test results, encounter notes, upcoming appointments, etc.  Non-urgent messages can be sent to your provider as well.   To learn more about what you can do with MyChart, go to [email or web address].    Your next appointment:   Follow up in one month    Signed, Sanda Klein, MD  07/22/2022 9:23 PM    [place]

## 2022-07-30 ENCOUNTER — Other Ambulatory Visit: Payer: Self-pay

## 2022-07-30 ENCOUNTER — Telehealth: Payer: Self-pay | Admitting: Cardiovascular Disease

## 2022-07-30 MED ORDER — TRAMADOL HCL 50 MG PO TABS: 50.0000 mg | ORAL_TABLET | Freq: Three times a day (TID) | ORAL | 3 refills | Status: DC | PRN

## 2022-07-30 MED ORDER — TRAMADOL HCL 50 MG PO TABS
50.0000 mg | ORAL_TABLET | Freq: Three times a day (TID) | ORAL | 3 refills | Status: DC | PRN
Start: 2022-07-30 — End: 2023-02-01

## 2022-07-30 NOTE — Telephone Encounter (Signed)
*  STAT* If patient is at the pharmacy, call can be transferred to refill team.   1. Which medications need to be refilled? (please list name of each medication and dose if known)  traMADol (ULTRAM) 50 MG tablet  2. Which pharmacy/location (including street and city if local pharmacy) is medication to be sent to?  [place], [place]  3. Do they need a 30 day or 90 day supply?  90 day supply

## 2022-07-30 NOTE — Telephone Encounter (Signed)
Medication had to be called in to desired pharmacy and patient is aware. Patient will pick up Rx on Monday

## 2022-08-22 NOTE — Progress Notes (Signed)
Cardiology Clinic Note   Patient Name: Tony Zhang Date of Encounter: 08/27/2022  Primary Care Provider:  [name], MD Primary Cardiologist:  Thurmon Fair, MD  Patient Profile    Tony Zhang 76 year old male presents to the clinic today for follow-up evaluation of his hypertension, CAD, and acute on chronic diastolic CHF  Past Medical History    Past Medical History:  Diagnosis Date   AAA (abdominal aortic aneurysm) (HCC)    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     COPD (chronic obstructive pulmonary disease) (HCC)    DJD (degenerative joint disease)    Dyslipidemia    Gout    Malignant hypertension    Peripheral vascular disease (HCC)    Pneumonia 08/2013   pt. reports that he is having this surgery 11/25/2014- for the lung problem that began with pneumonia in 09/2014   Shortness of breath    Venous thrombosis    Recurrent   Past Surgical History:  Procedure Laterality Date   APPENDECTOMY     BACK SURGERY     COLONOSCOPY N/A 04/13/2015   Procedure: COLONOSCOPY;  Surgeon: Carman Ching, MD;  Location: [facility] ENDOSCOPY;  Service: Endoscopy;  Laterality: N/A;   CORONARY STENT INTERVENTION N/A 06/04/2022   Procedure: CORONARY STENT INTERVENTION;  Surgeon: Corky Crafts, MD;  Location: [facility] INVASIVE CV LAB;  Service: Cardiovascular;  Laterality: N/A;   EMPYEMA DRAINAGE N/A 11/25/2014   Procedure: EMPYEMA DRAINAGE;  Surgeon: Delight Ovens, MD;  Location: [facility] OR;  Service: Thoracic;  Laterality: N/A;   ESOPHAGOGASTRODUODENOSCOPY N/A 04/10/2015   Procedure: ESOPHAGOGASTRODUODENOSCOPY (EGD);  Surgeon: Charlott Rakes, MD;  Location: [facility] ENDOSCOPY;  Service: Endoscopy;  Laterality: N/A;   EYE SURGERY     both eyes, cataracts removed, denies lens implants   FLEXIBLE SIGMOIDOSCOPY N/A 04/12/2015   Procedure: FLEXIBLE SIGMOIDOSCOPY;  Surgeon: Carman Ching, MD;  Location: [facility] ENDOSCOPY;  Service:  Endoscopy;  Laterality: N/A;   HERNIA REPAIR     LEFT HEART CATH AND CORONARY ANGIOGRAPHY N/A 06/04/2022   Procedure: LEFT HEART CATH AND CORONARY ANGIOGRAPHY;  Surgeon: Corky Crafts, MD;  Location: [facility] INVASIVE CV LAB;  Service: Cardiovascular;  Laterality: N/A;   SHOULDER SURGERY Right    VIDEO ASSISTED THORACOSCOPY Right 11/25/2014   Procedure: VIDEO ASSISTED THORACOSCOPY;  Surgeon: Delight Ovens, MD;  Location: [facility] OR;  Service: Thoracic;  Laterality: Right;   VIDEO BRONCHOSCOPY N/A 11/25/2014   Procedure: VIDEO BRONCHOSCOPY;  Surgeon: Delight Ovens, MD;  Location: [facility] OR;  Service: Thoracic;  Laterality: N/A;    Allergies  Allergies  Allergen Reactions   Codeine Hives and Other (See Comments)    Takes benadryl to stop allergic reactions    History of Present Illness    Tony Zhang is a PMH of AAA, chronic diastolic aortic occlusion, malignant HTN, DVT, PE, PVD, demand ischemia, CAD, COPD, GI bleed, AKI, hyperlipidemia, and sepsis due to cellulitis.  He underwent cardiac catheterization in the setting of NSTEMI and received DES to his LAD 06/09/2022.  He was also noted to have 80% ostial RCA and 75% OM 3.  His echocardiogram showed a reduced LVEF.  His PMH also includes left renal artery stenosis.  He was also noted to have moderate aortic stenosis.  Not a candidate for TAVR at this time due to recent stenting.  Dr. Royann Shivers reviewed and plan is to wait at least  6 months in the setting of ACS before performing TAVR.  He was seen by Dr. Sallyanne Kuster on 07/22/2022.  He was seen as a hospital follow-up from his admission in late August.  He underwent cardiac catheterization and received DES to his LAD.  He was compliant with his dual antiplatelet therapy which was transitioned to Plavix and Coumadin due to his need for anticoagulation.  He denies shortness of breath and chest discomfort.  He denied abdominal pain and intermittent claudication.  His blood pressure was elevated in the  180/89 range.  He was noted to have higher blood pressure in his left arm compared to his right.  He was restarted on his losartan.  It was felt that his renal artery stenosis may have progressed.  Plan to reevaluate in the setting of evaluation for possible TAVR during his CT.  He presents to the clinic today for follow-up evaluation and states he feels very well.  He indicates that he has been eating barbecue pork grinds which she feels is because of blood pressure tending towards.  We reviewed his medications.  He continues to be very active working at the [employer] 5 days/week.  He reports being active doing a lot of walking and lifting related to his work.  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  He also recently purchased a 1996 manual transmission Corvette.  He has a Scientist, water quality.  I will give him the salty 6 diet sheet, order BMP today and in 1 to 2 weeks, as well as fasting lipids and LFTs.  We will plan follow-up in 1 to 2 months.  Today he denies chest pain, shortness of breath, lower extremity edema, fatigue, palpitations, melena, hematuria, hemoptysis, diaphoresis, weakness, presyncope, syncope, orthopnea, and PND.     Home Medications    Prior to Admission medications   Medication Sig Start Date End Date Taking? Authorizing Provider  albuterol (VENTOLIN HFA) 108 (90 Base) MCG/ACT inhaler Inhale 2 puffs into the lungs 5 (five) times daily.    [provider]  allopurinol (ZYLOPRIM) 300 MG tablet Take 300 mg by mouth at bedtime.    [provider]  atorvastatin (LIPITOR) 80 MG tablet Take 1 tablet (80 mg total) by mouth daily. 07/22/22   Croitoru, Mihai, MD  cilostazol (PLETAL) 100 MG tablet Take 1 tablet (100 mg total) by mouth 2 (two) times daily. Please schedule appointment for refills. Patient taking differently: Take 100 mg by mouth 2 (two) times daily. 03/07/17   Croitoru, Mihai, MD  clopidogrel (PLAVIX) 75 MG tablet Take 1  tablet (75 mg total) by mouth daily. 06/07/22 06/07/23  Domenic Polite, MD  diazepam (VALIUM) 2 MG tablet Take 2 mg by mouth in the morning and at bedtime.    [provider]  empagliflozin (JARDIANCE) 10 MG TABS tablet Take 1 tablet (10 mg total) by mouth daily. 06/07/22   Domenic Polite, MD  furosemide (LASIX) 20 MG tablet Take 1 tablet (20 mg total) by mouth daily. Start on Tuesday 8/22 06/08/22   Domenic Polite, MD  losartan (COZAAR) 50 MG tablet Take 1 tablet (50 mg total) by mouth daily. 07/22/22   Croitoru, Mihai, MD  metoprolol succinate (TOPROL-XL) 100 MG 24 hr tablet Take 100 mg by mouth daily.     [provider]  nitroGLYCERIN (NITROSTAT) 0.3 MG SL tablet Place under the tongue. Patient not taking: Reported on 07/22/2022 06/09/22   [provider]  omeprazole (PRILOSEC) 40 MG capsule  Take 40 mg by mouth at bedtime.    [provider]  ondansetron (ZOFRAN) 8 MG tablet Take 8 mg by mouth 2 (two) times daily as needed for vomiting or nausea. 05/25/22   [provider]  spironolactone (ALDACTONE) 25 MG tablet Take 0.5 tablets (12.5 mg total) by mouth daily. 06/18/22   [name] M, PA-C  tamsulosin (FLOMAX) 0.4 MG CAPS capsule Take 0.4 mg by mouth every evening.    [provider]  traMADol (ULTRAM) 50 MG tablet Take 1 tablet (50 mg total) by mouth every 8 (eight) hours as needed for severe pain. 07/30/22   Croitoru, Mihai, MD  warfarin (COUMADIN) 5 MG tablet Take 5 mg by mouth See admin instructions. Take 1 tablet (5 mg) on Tuesday, Wednesday, Thursday, Saturday, Sunday. On Monday and Friday do not take. Or as directed by Coumadin clinic.    [provider]    Family History    Family History  Problem Relation Age of Onset   CAD Father 78   Liver cancer Brother    CAD Sister    He indicated that his mother is deceased. He indicated that his father is deceased. He indicated that the status of his sister is unknown. He  indicated that the status of his brother is unknown. He indicated that his maternal grandmother is deceased. He indicated that his maternal grandfather is deceased. He indicated that his paternal grandmother is deceased. He indicated that his paternal grandfather is deceased.  Social History    Social History   Socioeconomic History   Marital status: Widowed    Spouse name: Not on file   Number of children: 0   Years of education: Not on file   Highest education level: 6th grade  Occupational History    Comment: semi retired  Tobacco Use   Smoking status: Former    Types: Cigarettes    Quit date: 10/18/1992    Years since quitting: 29.8   Smokeless tobacco: Never  Vaping Use   Vaping Use: Never used  Substance and Sexual Activity   Alcohol use: No   Drug use: No   Sexual activity: Not on file  Other Topics Concern   Not on file  Social History Narrative   Not on file   Social Determinants of Health   Financial Resource Strain: Low Risk  (06/04/2022)   Overall Financial Resource Strain (CARDIA)    Difficulty of Paying Living Expenses: Not hard at all  Food Insecurity: No Food Insecurity (06/04/2022)   Hunger Vital Sign    Worried About Running Out of Food in the Last Year: Never true    Ran Out of Food in the Last Year: Never true  Transportation Needs: No Transportation Needs (06/04/2022)   PRAPARE - Administrator, Civil Service (Medical): No    Lack of Transportation (Non-Medical): No  Physical Activity: Not on file  Stress: Not on file  Social Connections: Not on file  Intimate Partner Violence: Not on file     Review of Systems    General:  No chills, fever, night sweats or weight changes.  Cardiovascular:  No chest pain, dyspnea on exertion, edema, orthopnea, palpitations, paroxysmal nocturnal dyspnea. Dermatological: No rash, lesions/masses Respiratory: No cough, dyspnea Urologic: No hematuria, dysuria Abdominal:   No nausea, vomiting, diarrhea,  bright red blood per rectum, melena, or hematemesis Neurologic:  No visual changes, wkns, changes in mental status. All other systems reviewed and are otherwise negative except as  noted above.  Physical Exam    VS:  BP (!) 142/74   Pulse 81   Ht 6\' 1"  (1.854 m)   Wt 215 lb 12.8 oz (97.9 kg)   SpO2 92%   BMI 28.47 kg/m  , BMI Body mass index is 28.47 kg/m. GEN: Well nourished, well developed, in no acute distress. HEENT: normal. Neck: Supple, no JVD, carotid bruits, or masses. Cardiac: RRR, 2/6 systolic murmur heard best along right sternal border.   , rubs, or gallops. No clubbing, cyanosis, edema.  Radials/DP/PT 2+ and equal bilaterally.  Respiratory:  Respirations regular and unlabored, clear to auscultation bilaterally. GI: Soft, nontender, nondistended, BS + x 4. MS: no deformity or atrophy. Skin: warm and dry, no rash. Neuro:  Strength and sensation are intact. Psych: Normal affect.  Accessory Clinical Findings    Recent Labs: 06/02/2022: ALT 10; B Natriuretic Peptide 1,245.2; TSH 0.818 06/06/2022: Hemoglobin 15.2; Platelets 251 06/18/2022: BUN 15; Creatinine, Ser 1.33; Potassium 5.1; Sodium 134   Recent Lipid Panel    Component Value Date/Time   CHOL 140 06/03/2022 0257   CHOL 129 07/09/2019 1014   TRIG 97 06/03/2022 0257   HDL 36 (L) 06/03/2022 0257   HDL 30 (L) 07/09/2019 1014   CHOLHDL 3.9 06/03/2022 0257   VLDL 19 06/03/2022 0257   LDLCALC 85 06/03/2022 0257   LDLCALC 64 07/09/2019 1014    HYPERTENSION CONTROL Vitals:   08/27/22 0906 08/27/22 0927  BP: (!) 162/74 (!) 142/74    The patient's blood pressure is elevated above target today.  In order to address the patient's elevated BP: Blood pressure will be monitored at home to determine if medication changes need to be made.; A current anti-hypertensive medication was adjusted today.       ECG personally reviewed by me today-none today.  Echocardiogram 06/03/2022 IMPRESSIONS     1. Left  ventricular ejection fraction, by estimation, is 50 to 55%. The  left ventricle has low normal function. The left ventricle demonstrates  regional wall motion abnormalities (see scoring diagram/findings for  description). Left ventricular diastolic   parameters are consistent with Grade I diastolic dysfunction (impaired  relaxation). Elevated left ventricular end-diastolic pressure. The E/e' is  5117. There is severe hypokinesis of the left ventricular, mid-apical  inferior wall, inferoseptal wall and  apical segment. The apex appears anerusymal.   2. The mitral valve is abnormal. Trivial mitral valve regurgitation.   3. The aortic valve is calcified. There is severe calcifcation of the  aortic valve. Moderate to severe aortic valve stenosis. Aortic valve area,  by VTI measures 0.48 cm. Aortic valve mean gradient measures 36.0 mmHg.  Aortic valve Vmax measures 3.57  m/s. Peak gradient 51 mmHg, DI 0.21.   4. There is normal pulmonary artery systolic pressure.   5. The inferior vena cava is normal in size with greater than 50%  respiratory variability, suggesting right atrial pressure of 3 mmHg.   Comparison(s): Prior images reviewed side by side. Changes from prior  study are noted. 02/22/2022: LVEF >75%, mild (probably moderate) AS - mean  gradient 16 mmHg, DI 0.33.   Cardiac catheterization 06/04/2022   Ost RCA to Prox RCA lesion is 80% stenosed.  Heavily calcified.  Would require atherectomy to fix.   Prox RCA lesion is 25% stenosed.   Mid RCA lesion is 70% stenosed.  Diffuse disease between the ostial lesion and this lesion.   3rd Mrg lesion is 75% stenosed.   1st Diag lesion is 50%  stenosed.   Ost LAD to Mid LAD lesion is 25% stenosed.   Mid LAD lesion is 90% stenosed.   A drug-eluting stent was successfully placed using a SYNERGY XD 3.0X16, postdilated to greater than 3.5 mm.   Post intervention, there is a 0% residual stenosis.   The left ventricular systolic function is normal.    LV end diastolic pressure is normal.   The left ventricular ejection fraction is 50-55% by visual estimate.   There is no aortic valve stenosis.   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  There is moderate diffuse disease in the proximal LAD.  The culprit lesion was a severe stenosis in the mid LAD.  This was successfully treated with a 3.0 x 16 Synergy drug-eluting stent, postdilated to greater than 3.5 mm in diameter.   Resume IV heparin 2 hours after the TR band is removed.  Coumadin can also be resumed tonight.  Target INR greater than 2.  Aspirin will be used for 1 month and then stop.  Clopidogrel will be continued for ideally 12 months if no bleeding problems.   Plan discussed with Dr. Excell Seltzer who is the primary cardiologist.  Would plan medical therapy for his residual disease.  Diagnostic Dominance: Co-dominant  Intervention      Assessment & Plan   1.  Hypertension-BP today 142/74.  Initially 162/74.  Reports dietary indiscretion. Continue  metoprolol, spironolactone Increase losartan to 100 mg daily Heart healthy low-sodium diet-salty 6 given Increase physical activity as tolerated Repeat BMP today and in 1 to 2 weeks.  Aortic stenosis-no increased DOE or activity intolerance.  Plan for work-up for TAVR.  Continues to be asymptomatic.  Ideally patient will not have TAVR procedure until at least 6 months after recent cardiac catheterization. Continue metoprolol, losartan, furosemide Increase physical activity as tolerated  Coronary artery disease-no chest pain today.  Underwent cardiac catheterization with DES to his LAD 8/23.  Was transition from dual antiplatelet therapy to Plavix and warfarin due to need for anticoagulation. Continue clopidogrel, metoprolol, atorvastatin Heart healthy low-sodium diet-salty 6 given Increase physical activity as tolerated Repeat fasting lipids ,LFTs in 2-4 weeks.  CHF-euvolemic today.  NYHA class I.  Weight stable. Continue  furosemide, Jardiance, spironolactone, metoprolol Heart healthy low-sodium diet-salty 6 given Increase physical activity as tolerated   Peripheral arterial disease-denies claudication.  Known occlusion of the distal abdominal aorta with reconstruction. Continue current medical therapy, surveillance Increase physical activity as tolerated  History of PE, DVT-denies shortness of breath, lower extremity edema, erythema.  Previously noted to have persistent left-sided inferior vena cava occlusion.  Compliant with Coumadin.  Denies bleeding issues. Continue Coumadin   Disposition: Follow-up with Dr. Royann Shivers in 1-2 months.   Tony Zhang. Tony Haser NP-C     08/27/2022, 9:36 AM [facility] [address] Office [phone] Fax [phone]    I spent 14 minutes examining this patient, reviewing medications, and using patient centered shared decision making involving her cardiac care.  Prior to her visit I spent greater than 20 minutes reviewing her past medical history,  medications, and prior cardiac tests.

## 2022-08-27 ENCOUNTER — Encounter: Payer: Self-pay | Admitting: General Practice

## 2022-08-27 ENCOUNTER — Ambulatory Visit: Payer: Medicare Other | Attending: General Practice | Admitting: General Practice

## 2022-08-27 ENCOUNTER — Other Ambulatory Visit: Payer: Self-pay

## 2022-08-27 VITALS — BP 142/74 | HR 81 | Ht 73.0 in | Wt 215.8 lb

## 2022-08-27 DIAGNOSIS — I350 Nonrheumatic aortic (valve) stenosis: Secondary | ICD-10-CM

## 2022-08-27 DIAGNOSIS — I739 Peripheral vascular disease, unspecified: Secondary | ICD-10-CM

## 2022-08-27 DIAGNOSIS — I10 Essential (primary) hypertension: Secondary | ICD-10-CM | POA: Diagnosis not present

## 2022-08-27 DIAGNOSIS — Z86711 Personal history of pulmonary embolism: Secondary | ICD-10-CM

## 2022-08-27 DIAGNOSIS — I25110 Atherosclerotic heart disease of native coronary artery with unstable angina pectoris: Secondary | ICD-10-CM

## 2022-08-27 DIAGNOSIS — I5032 Chronic diastolic (congestive) heart failure: Secondary | ICD-10-CM

## 2022-08-27 MED ORDER — LOSARTAN POTASSIUM 100 MG PO TABS
100.0000 mg | ORAL_TABLET | Freq: Every day | ORAL | 1 refills | Status: DC
Start: 2022-08-27 — End: 2022-09-28

## 2022-08-27 NOTE — Patient Instructions (Addendum)
Medication Instructions:  INCREASE Losartan to 100mg  Take 1 tablet once a day  *If you need a refill on your cardiac medications before your next appointment, please call your pharmacy*   Lab Work: TODAY-BNP 1-2 WEEKS-BNP, FASTING LIPIDS & LFT If you have labs (blood work) drawn today and your tests are completely normal, you will receive your results only by: MyChart Message (if you have MyChart) OR A paper copy in the mail If you have any lab test that is abnormal or we need to change your treatment, we will call you to review the results.   Testing/Procedures: NONE ORDERED   Follow-Up: At [facility], you and your health needs are our priority.  As part of our continuing mission to provide you with exceptional heart care, we have created designated Provider Care Teams.  These Care Teams include your primary Cardiologist (physician) and Advanced Practice Providers (APPs -  Physician Assistants and Nurse Practitioners) who all work together to provide you with the care you need, when you need it.  We recommend signing up for the patient portal called "MyChart".  Sign up information is provided on this After Visit Summary.  MyChart is used to connect with patients for Virtual Visits (Telemedicine).  Patients are able to view lab/test results, encounter notes, upcoming appointments, etc.  Non-urgent messages can be sent to your provider as well.   To learn more about what you can do with MyChart, go to [facility].    Your next appointment:   1-2 month(s)  The format for your next appointment:   In Person  Provider:   [email or web address], MD  or Thurmon Fair, NP   Other Instructions Salty 6 handout given   Important Information About Sugar

## 2022-08-28 LAB — PRO B NATRIURETIC PEPTIDE: NT-Pro BNP: 450 pg/mL (ref 0–486)

## 2022-09-10 LAB — LIPID PANEL
Chol/HDL Ratio: 4.9 ratio (ref 0.0–5.0)
Cholesterol, Total: 153 mg/dL (ref 100–199)
HDL: 31 mg/dL — ABNORMAL LOW (ref >39)
LDL Chol Calc (NIH): 83 mg/dL (ref 0–99)
Triglycerides: 231 mg/dL — ABNORMAL HIGH (ref 0–149)
VLDL Cholesterol Cal: 39 mg/dL (ref 5–40)

## 2022-09-10 LAB — HEPATIC FUNCTION PANEL
ALT: 11 IU/L (ref 0–44)
AST: 16 IU/L (ref 0–40)
Albumin: 4.6 g/dL (ref 3.8–4.8)
Alkaline Phosphatase: 119 IU/L (ref 44–121)
Bilirubin Total: 0.7 mg/dL (ref 0.0–1.2)
Bilirubin, Direct: 0.23 mg/dL (ref 0.00–0.40)
Total Protein: 7.8 g/dL (ref 6.0–8.5)

## 2022-09-10 LAB — PRO B NATRIURETIC PEPTIDE: NT-Pro BNP: 432 pg/mL (ref 0–486)

## 2022-09-14 ENCOUNTER — Other Ambulatory Visit: Payer: Self-pay | Admitting: Cardiovascular Disease

## 2022-09-27 NOTE — Progress Notes (Unsigned)
Cardiology Clinic Note   Patient Name: Tony Zhang Date of Encounter: 09/28/2022  Primary Care Provider:  [name], MD Primary Cardiologist:  Thurmon Fair, MD  Patient Profile    Tony Zhang 76 year old male presents to the clinic today for follow-up evaluation of his hypertension, CAD, and acute on chronic diastolic CHF  Past Medical History    Past Medical History:  Diagnosis Date   AAA (abdominal aortic aneurysm) (HCC)    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     COPD (chronic obstructive pulmonary disease) (HCC)    DJD (degenerative joint disease)    Dyslipidemia    Gout    Malignant hypertension    Peripheral vascular disease (HCC)    Pneumonia 08/2013   pt. reports that he is having this surgery 11/25/2014- for the lung problem that began with pneumonia in 09/2014   Shortness of breath    Venous thrombosis    Recurrent   Past Surgical History:  Procedure Laterality Date   APPENDECTOMY     BACK SURGERY     COLONOSCOPY N/A 04/13/2015   Procedure: COLONOSCOPY;  Surgeon: Carman Ching, MD;  Location: [facility] ENDOSCOPY;  Service: Endoscopy;  Laterality: N/A;   CORONARY STENT INTERVENTION N/A 06/04/2022   Procedure: CORONARY STENT INTERVENTION;  Surgeon: Corky Crafts, MD;  Location: [facility] INVASIVE CV LAB;  Service: Cardiovascular;  Laterality: N/A;   EMPYEMA DRAINAGE N/A 11/25/2014   Procedure: EMPYEMA DRAINAGE;  Surgeon: Delight Ovens, MD;  Location: [facility] OR;  Service: Thoracic;  Laterality: N/A;   ESOPHAGOGASTRODUODENOSCOPY N/A 04/10/2015   Procedure: ESOPHAGOGASTRODUODENOSCOPY (EGD);  Surgeon: Charlott Rakes, MD;  Location: [facility] ENDOSCOPY;  Service: Endoscopy;  Laterality: N/A;   EYE SURGERY     both eyes, cataracts removed, denies lens implants   FLEXIBLE SIGMOIDOSCOPY N/A 04/12/2015   Procedure: FLEXIBLE SIGMOIDOSCOPY;  Surgeon: Carman Ching, MD;  Location: [facility] ENDOSCOPY;  Service:  Endoscopy;  Laterality: N/A;   HERNIA REPAIR     LEFT HEART CATH AND CORONARY ANGIOGRAPHY N/A 06/04/2022   Procedure: LEFT HEART CATH AND CORONARY ANGIOGRAPHY;  Surgeon: Corky Crafts, MD;  Location: [facility] INVASIVE CV LAB;  Service: Cardiovascular;  Laterality: N/A;   SHOULDER SURGERY Right    VIDEO ASSISTED THORACOSCOPY Right 11/25/2014   Procedure: VIDEO ASSISTED THORACOSCOPY;  Surgeon: Delight Ovens, MD;  Location: [facility] OR;  Service: Thoracic;  Laterality: Right;   VIDEO BRONCHOSCOPY N/A 11/25/2014   Procedure: VIDEO BRONCHOSCOPY;  Surgeon: Delight Ovens, MD;  Location: [facility] OR;  Service: Thoracic;  Laterality: N/A;    Allergies  Allergies  Allergen Reactions   Codeine Hives and Other (See Comments)    Takes benadryl to stop allergic reactions    History of Present Illness    Tony Zhang is a PMH of AAA, chronic diastolic aortic occlusion, malignant HTN, DVT, PE, PVD, demand ischemia, CAD, COPD, GI bleed, AKI, hyperlipidemia, and sepsis due to cellulitis.  He underwent cardiac catheterization in the setting of NSTEMI and received DES to his LAD 06/09/2022.  He was also noted to have 80% ostial RCA and 75% OM 3.  His echocardiogram showed a reduced LVEF.  His PMH also includes left renal artery stenosis.  He was also noted to have moderate aortic stenosis.  Not a candidate for TAVR at this time due to recent stenting.  Dr. Royann Shivers reviewed and plan is to wait at least  6 months in the setting of ACS before performing TAVR.  He was seen by Dr. Royann Shiversroitoru on 07/22/2022.  He was seen as a hospital follow-up from his admission in late August.  He underwent cardiac catheterization and received DES to his LAD.  He was compliant with his dual antiplatelet therapy which was transitioned to Plavix and Coumadin due to his need for anticoagulation.  He denies shortness of breath and chest discomfort.  He denied abdominal pain and intermittent claudication.  His blood pressure was elevated in the  180/89 range.  He was noted to have higher blood pressure in his left arm compared to his right.  He was restarted on his losartan.  It was felt that his renal artery stenosis may have progressed.  Plan to reevaluate in the setting of evaluation for possible TAVR during his CT.  He presented to the clinic 08/27/22 for follow-up evaluation and stated he felt very well.  He indicated that he had been eating barbecue pork rinds.  We reviewed his medications.  He continued to be very active working at the [employer] 5 days/week.  He reported being active doing a lot of walking and lifting related to his work.  He acquired all of his flea market items from buying storage units.  He purchased a 1996 manual transmission Corvette. I gave him the salty 6 diet sheet, ordered BMP  and planned for repeat BMP in 1 to 2 weeks, as well as fasting lipids and LFTs.  We  planned follow-up in 1 to 2 months.  His lab work showed a elevated triglyceride level.  I prescribed fenofibrate 120 mg daily.  He presents the clinic today for follow-up evaluation states he continues to be impaired with his physical activity due to this left knee injury.  He indicates that he did not receive fenofibrate prescription at his pharmacy.  I will renew this.  He has been compliant with his low-sodium heart healthy diet.  He continues to enjoy driving his classic cars.  His weight has been stable.  His blood pressure today initially is 147/60 and on recheck is 128/70.  I reviewed the importance of heart healthy low-sodium diet and maintaining physical activity.  I will repeat a BMP today and plan follow-up in 4 to 6 months.  Today he denies chest pain, shortness of breath, lower extremity edema, fatigue, palpitations, melena, hematuria, hemoptysis, diaphoresis, weakness, presyncope, syncope, orthopnea, and PND.     Home Medications    Prior to Admission medications   Medication Sig Start Date End Date Taking? Authorizing Provider   albuterol (VENTOLIN HFA) 108 (90 Base) MCG/ACT inhaler Inhale 2 puffs into the lungs 5 (five) times daily.    [provider]  allopurinol (ZYLOPRIM) 300 MG tablet Take 300 mg by mouth at bedtime.    [provider]  atorvastatin (LIPITOR) 80 MG tablet Take 1 tablet (80 mg total) by mouth daily. 07/22/22   Croitoru, Mihai, MD  cilostazol (PLETAL) 100 MG tablet Take 1 tablet (100 mg total) by mouth 2 (two) times daily. Please schedule appointment for refills. Patient taking differently: Take 100 mg by mouth 2 (two) times daily. 03/07/17   Croitoru, Mihai, MD  clopidogrel (PLAVIX) 75 MG tablet Take 1 tablet (75 mg total) by mouth daily. 06/07/22 06/07/23  Zannie CoveJoseph, Preetha, MD  diazepam (VALIUM) 2 MG tablet Take 2 mg by mouth in the morning and at bedtime.    [provider]  empagliflozin (JARDIANCE) 10 MG TABS tablet Take  1 tablet (10 mg total) by mouth daily. 06/07/22   Zannie Cove, MD  furosemide (LASIX) 20 MG tablet Take 1 tablet (20 mg total) by mouth daily. Start on Tuesday 8/22 06/08/22   Zannie Cove, MD  losartan (COZAAR) 50 MG tablet Take 1 tablet (50 mg total) by mouth daily. 07/22/22   Croitoru, Mihai, MD  metoprolol succinate (TOPROL-XL) 100 MG 24 hr tablet Take 100 mg by mouth daily.     [provider]  nitroGLYCERIN (NITROSTAT) 0.3 MG SL tablet Place under the tongue. Patient not taking: Reported on 07/22/2022 06/09/22   [provider]  omeprazole (PRILOSEC) 40 MG capsule Take 40 mg by mouth at bedtime.    [provider]  ondansetron (ZOFRAN) 8 MG tablet Take 8 mg by mouth 2 (two) times daily as needed for vomiting or nausea. 05/25/22   [provider]  spironolactone (ALDACTONE) 25 MG tablet Take 0.5 tablets (12.5 mg total) by mouth daily. 06/18/22   [name] M, PA-C  tamsulosin (FLOMAX) 0.4 MG CAPS capsule Take 0.4 mg by mouth every evening.    [provider]  traMADol (ULTRAM) 50 MG tablet Take 1 tablet  (50 mg total) by mouth every 8 (eight) hours as needed for severe pain. 07/30/22   Croitoru, Mihai, MD  warfarin (COUMADIN) 5 MG tablet Take 5 mg by mouth See admin instructions. Take 1 tablet (5 mg) on Tuesday, Wednesday, Thursday, Saturday, Sunday. On Monday and Friday do not take. Or as directed by Coumadin clinic.    [provider]    Family History    Family History  Problem Relation Age of Onset   CAD Father 45   Liver cancer Brother    CAD Sister    He indicated that his mother is deceased. He indicated that his father is deceased. He indicated that the status of his sister is unknown. He indicated that the status of his brother is unknown. He indicated that his maternal grandmother is deceased. He indicated that his maternal grandfather is deceased. He indicated that his paternal grandmother is deceased. He indicated that his paternal grandfather is deceased.  Social History    Social History   Socioeconomic History   Marital status: Widowed    Spouse name: Not on file   Number of children: 0   Years of education: Not on file   Highest education level: 6th grade  Occupational History    Comment: semi retired  Tobacco Use   Smoking status: Former    Types: Cigarettes    Quit date: 10/18/1992    Years since quitting: 29.9   Smokeless tobacco: Never  Vaping Use   Vaping Use: Never used  Substance and Sexual Activity   Alcohol use: No   Drug use: No   Sexual activity: Not on file  Other Topics Concern   Not on file  Social History Narrative   Not on file   Social Determinants of Health   Financial Resource Strain: Low Risk  (06/04/2022)   Overall Financial Resource Strain (CARDIA)    Difficulty of Paying Living Expenses: Not hard at all  Food Insecurity: No Food Insecurity (06/04/2022)   Hunger Vital Sign    Worried About Running Out of Food in the Last Year: Never true    Ran Out of Food in the Last Year: Never true  Transportation Needs: No  Transportation Needs (06/04/2022)   PRAPARE - Administrator, Civil Service (Medical): No  Lack of Transportation (Non-Medical): No  Physical Activity: Not on file  Stress: Not on file  Social Connections: Not on file  Intimate Partner Violence: Not on file     Review of Systems    General:  No chills, fever, night sweats or weight changes.  Cardiovascular:  No chest pain, dyspnea on exertion, edema, orthopnea, palpitations, paroxysmal nocturnal dyspnea. Dermatological: No rash, lesions/masses Respiratory: No cough, dyspnea Urologic: No hematuria, dysuria Abdominal:   No nausea, vomiting, diarrhea, bright red blood per rectum, melena, or hematemesis Neurologic:  No visual changes, wkns, changes in mental status. All other systems reviewed and are otherwise negative except as noted above.  Physical Exam    VS:  BP 128/72   Pulse 73   Ht  (1.854 m)   Wt 211 lb 12.8 oz (96.1 kg)   SpO2 95%   BMI 27.94 kg/m  , BMI Body mass index is 27.94 kg/m. GEN: Well nourished, well developed, in no acute distress. HEENT: normal. Neck: Supple, no JVD, carotid bruits, or masses. Cardiac: RRR, 2/6 systolic murmur heard best along right sternal border.   , rubs, or gallops. No clubbing, cyanosis, edema.  Radials/DP/PT 2+ and equal bilaterally.  Respiratory:  Respirations regular and unlabored, clear to auscultation bilaterally. GI: Soft, nontender, nondistended, BS + x 4. MS: no deformity or atrophy. Skin: warm and dry, no rash. Neuro:  Strength and sensation are intact. Psych: Normal affect.  Accessory Clinical Findings    Recent Labs: 06/02/2022: B Natriuretic Peptide 1,245.2; TSH 0.818 06/06/2022: Hemoglobin 15.2; Platelets 251 06/18/2022: BUN 15; Creatinine, Ser 1.33; Potassium 5.1; Sodium 134 09/08/2022: ALT 11; NT-Pro BNP 432   Recent Lipid Panel    Component Value Date/Time   CHOL 153 09/08/2022 1004   TRIG 231 (H) 09/08/2022 1004   HDL 31 (L) 09/08/2022 1004    CHOLHDL 4.9 09/08/2022 1004   CHOLHDL 3.9 06/03/2022 0257   VLDL 19 06/03/2022 0257   LDLCALC 83 09/08/2022 1004         ECG personally reviewed by me today-none today.  Echocardiogram 06/03/2022 IMPRESSIONS     1. Left ventricular ejection fraction, by estimation, is 50 to 55%. The  left ventricle has low normal function. The left ventricle demonstrates  regional wall motion abnormalities (see scoring diagram/findings for  description). Left ventricular diastolic   parameters are consistent with Grade I diastolic dysfunction (impaired  relaxation). Elevated left ventricular end-diastolic pressure. The E/e' is  32. There is severe hypokinesis of the left ventricular, mid-apical  inferior wall, inferoseptal wall and  apical segment. The apex appears anerusymal.   2. The mitral valve is abnormal. Trivial mitral valve regurgitation.   3. The aortic valve is calcified. There is severe calcifcation of the  aortic valve. Moderate to severe aortic valve stenosis. Aortic valve area,  by VTI measures 0.48 cm. Aortic valve mean gradient measures 36.0 mmHg.  Aortic valve Vmax measures 3.57  m/s. Peak gradient 51 mmHg, DI 0.21.   4. There is normal pulmonary artery systolic pressure.   5. The inferior vena cava is normal in size with greater than 50%  respiratory variability, suggesting right atrial pressure of 3 mmHg.   Comparison(s): Prior images reviewed side by side. Changes from prior  study are noted. 02/22/2022: LVEF >75%, mild (probably moderate) AS - mean  gradient 16 mmHg, DI 0.33.   Cardiac catheterization 06/04/2022   Ost RCA to Prox RCA lesion is 80% stenosed.  Heavily calcified.  Would require atherectomy to  fix.   Prox RCA lesion is 25% stenosed.   Mid RCA lesion is 70% stenosed.  Diffuse disease between the ostial lesion and this lesion.   3rd Mrg lesion is 75% stenosed.   1st Diag lesion is 50% stenosed.   Ost LAD to Mid LAD lesion is 25% stenosed.   Mid LAD lesion is  90% stenosed.   A drug-eluting stent was successfully placed using a SYNERGY XD 3.0X16, postdilated to greater than 3.5 mm.   Post intervention, there is a 0% residual stenosis.   The left ventricular systolic function is normal.   LV end diastolic pressure is normal.   The left ventricular ejection fraction is 50-55% by visual estimate.   There is no aortic valve stenosis.   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  There is moderate diffuse disease in the proximal LAD.  The culprit lesion was a severe stenosis in the mid LAD.  This was successfully treated with a 3.0 x 16 Synergy drug-eluting stent, postdilated to greater than 3.5 mm in diameter.   Resume IV heparin 2 hours after the TR band is removed.  Coumadin can also be resumed tonight.  Target INR greater than 2.  Aspirin will be used for 1 month and then stop.  Clopidogrel will be continued for ideally 12 months if no bleeding problems.   Plan discussed with Dr. Excell Seltzer who is the primary cardiologist.  Would plan medical therapy for his residual disease.  Diagnostic Dominance: Co-dominant  Intervention      Assessment & Plan   1.  Hypertension-BP today 128/72.    Reports better compliance with low-sodium diet. Continue  metoprolol, spironolactone Continue losartan to 100 mg daily Heart healthy low-sodium diet-salty 6 reviewed Increase physical activity as tolerated Repeat BMP today   Coronary artery disease-no chest pain today.  Underwent cardiac catheterization with DES to his LAD 8/23.  Was transition from dual antiplatelet therapy to Plavix and warfarin due to need for anticoagulation. Continue clopidogrel, metoprolol, atorvastatin Continue fenofibrate Heart healthy low-sodium diet Increase physical activity as tolerated Repeat fasting lipids ,LFTs in 8 weeks.  CHF-euvolemic today.  NYHA class I.  Weight stable. Continue furosemide, Jardiance, spironolactone, metoprolol Heart healthy low-sodium diet Increase  physical activity as tolerated  Aortic stenosis-no increased DOE or activity intolerance.  Plan for work-up for TAVR.  Continues to be asymptomatic.  Ideally patient will not have TAVR procedure until at least 6 months after recent cardiac catheterization. Continue metoprolol, losartan, furosemide Increase physical activity as tolerated  Peripheral arterial disease-continues to deny claudication.  Known occlusion of the distal abdominal aorta with reconstruction. Continue current medical therapy, surveillance Increase physical activity as tolerated  History of PE, DVT-denies shortness of breath, lower extremity edema, erythema.  Previously noted to have persistent left-sided inferior vena cava occlusion.  Compliant with Coumadin.  Denies bleeding issues.    Disposition: Follow-up with Dr. Royann Shivers in 4-6 months.   Tony Ripple. Krishon Adkison NP-C     09/28/2022, 10:13 AM  [facility] [address] Office [phone] Fax [phone]    I spent 14 minutes examining this patient, reviewing medications, and using patient centered shared decision making involving her cardiac care.  Prior to her visit I spent greater than 20 minutes reviewing her past medical history,  medications, and prior cardiac tests.

## 2022-09-28 ENCOUNTER — Encounter: Payer: Self-pay | Admitting: General Practice

## 2022-09-28 ENCOUNTER — Other Ambulatory Visit: Payer: Self-pay

## 2022-09-28 ENCOUNTER — Ambulatory Visit: Payer: Medicare Other | Attending: General Practice | Admitting: General Practice

## 2022-09-28 VITALS — BP 128/72 | HR 73 | Ht 73.0 in | Wt 211.8 lb

## 2022-09-28 DIAGNOSIS — I739 Peripheral vascular disease, unspecified: Secondary | ICD-10-CM

## 2022-09-28 DIAGNOSIS — I5032 Chronic diastolic (congestive) heart failure: Secondary | ICD-10-CM

## 2022-09-28 DIAGNOSIS — I10 Essential (primary) hypertension: Secondary | ICD-10-CM | POA: Diagnosis not present

## 2022-09-28 DIAGNOSIS — Z86711 Personal history of pulmonary embolism: Secondary | ICD-10-CM

## 2022-09-28 DIAGNOSIS — I350 Nonrheumatic aortic (valve) stenosis: Secondary | ICD-10-CM

## 2022-09-28 DIAGNOSIS — I25110 Atherosclerotic heart disease of native coronary artery with unstable angina pectoris: Secondary | ICD-10-CM | POA: Diagnosis not present

## 2022-09-28 DIAGNOSIS — E785 Hyperlipidemia, unspecified: Secondary | ICD-10-CM

## 2022-09-28 DIAGNOSIS — I2510 Atherosclerotic heart disease of native coronary artery without angina pectoris: Secondary | ICD-10-CM

## 2022-09-28 MED ORDER — LOSARTAN POTASSIUM 100 MG PO TABS: 100.0000 mg | ORAL_TABLET | Freq: Every day | ORAL | 1 refills | Status: DC

## 2022-09-28 MED ORDER — FENOFIBRIC ACID 135 MG PO CPDR: 135.0000 mg | DELAYED_RELEASE_CAPSULE | Freq: Every day | ORAL | 3 refills | Status: DC

## 2022-09-28 MED ORDER — FUROSEMIDE 20 MG PO TABS: 20.0000 mg | ORAL_TABLET | Freq: Every day | ORAL | 3 refills | Status: DC

## 2022-09-28 MED ORDER — CLOPIDOGREL BISULFATE 75 MG PO TABS: 75.0000 mg | ORAL_TABLET | Freq: Every day | ORAL | 0 refills | Status: DC

## 2022-09-28 NOTE — Patient Instructions (Signed)
Medication Instructions:  START Fenofibrate 135 mg daily   *If you need a refill on your cardiac medications before your next appointment, please call your pharmacy*  Lab Work: Your physician recommends that you return for lab work TODAY:  BMP  If you have labs (blood work) drawn today and your tests are completely normal, you will receive your results only by: MyChart Message (if you have MyChart) OR A paper copy in the mail If you have any lab test that is abnormal or we need to change your treatment, we will call you to review the results.  Testing/Procedures: NONE ordered at the time of appointment   Follow-Up: At [facility], you and your health needs are our priority.  As part of our continuing mission to provide you with exceptional heart care, we have created designated Provider Care Teams.  These Care Teams include your primary Cardiologist (physician) and Advanced Practice Providers (APPs -  Physician Assistants and Nurse Practitioners) who all work together to provide you with the care you need, when you need it.  We recommend signing up for the patient portal called "MyChart".  Sign up information is provided on this After Visit Summary.  MyChart is used to connect with patients for Virtual Visits (Telemedicine).  Patients are able to view lab/test results, encounter notes, upcoming appointments, etc.  Non-urgent messages can be sent to your provider as well.   To learn more about what you can do with MyChart, go to [email or web address].    Your next appointment:   4-6 month(s)  The format for your next appointment:   In Person  Provider:   Thurmon Fair, MD  or Edd Fabian, FNP        Other Instructions  Important Information About Sugar

## 2022-09-28 NOTE — Progress Notes (Signed)
Ronney Asters, NP 09/12/2022 11:45 AM EST     Please contact Mr. Surace and let him know that his lab work is been reviewed.  His triglycerides were elevated at 231.  We will add fenofibrate 120 mg daily to his medication regimen.  We will repeat his fasting lipids and LFTs in 8 weeks.  Please ask him to increase the fiber in his diet and increase his physical activity as tolerated.  Thank you.   Lipid and LFT Due 11-03-22

## 2022-09-29 LAB — BASIC METABOLIC PANEL
BUN/Creatinine Ratio: 14 (ref 10–24)
BUN: 20 mg/dL (ref 8–27)
CO2: 18 mmol/L — ABNORMAL LOW (ref 20–29)
Calcium: 8.9 mg/dL (ref 8.6–10.2)
Chloride: 100 mmol/L (ref 96–106)
Creatinine, Ser: 1.44 mg/dL — ABNORMAL HIGH (ref 0.76–1.27)
Glucose: 108 mg/dL — ABNORMAL HIGH (ref 70–99)
Potassium: 5.7 mmol/L — ABNORMAL HIGH (ref 3.5–5.2)
Sodium: 139 mmol/L (ref 134–144)
eGFR: 50 mL/min/{1.73_m2} — ABNORMAL LOW (ref >59)

## 2022-10-26 ENCOUNTER — Other Ambulatory Visit: Payer: Self-pay | Admitting: General Practice

## 2022-10-29 ENCOUNTER — Other Ambulatory Visit: Payer: Self-pay | Admitting: General Practice

## 2022-11-03 LAB — HEPATIC FUNCTION PANEL
ALT: 11 IU/L (ref 0–44)
AST: 19 IU/L (ref 0–40)
Albumin: 4.3 g/dL (ref 3.8–4.8)
Alkaline Phosphatase: 60 IU/L (ref 44–121)
Bilirubin Total: 0.5 mg/dL (ref 0.0–1.2)
Bilirubin, Direct: 0.27 mg/dL (ref 0.00–0.40)
Total Protein: 7.4 g/dL (ref 6.0–8.5)

## 2022-11-03 LAB — LIPID PANEL
Chol/HDL Ratio: 5.6 ratio — ABNORMAL HIGH (ref 0.0–5.0)
Cholesterol, Total: 196 mg/dL (ref 100–199)
HDL: 35 mg/dL — ABNORMAL LOW (ref >39)
LDL Chol Calc (NIH): 109 mg/dL — ABNORMAL HIGH (ref 0–99)
Triglycerides: 302 mg/dL — ABNORMAL HIGH (ref 0–149)
VLDL Cholesterol Cal: 52 mg/dL — ABNORMAL HIGH (ref 5–40)

## 2022-11-08 ENCOUNTER — Other Ambulatory Visit: Payer: Self-pay

## 2022-11-08 DIAGNOSIS — E785 Hyperlipidemia, unspecified: Secondary | ICD-10-CM

## 2022-12-07 ENCOUNTER — Telehealth: Payer: Self-pay | Admitting: Pharmacist Clinician (PhC)/ Clinical Pharmacy Specialist

## 2022-12-07 ENCOUNTER — Encounter: Payer: Self-pay | Admitting: Pharmacist Clinician (PhC)/ Clinical Pharmacy Specialist

## 2022-12-07 ENCOUNTER — Telehealth: Payer: Self-pay

## 2022-12-07 ENCOUNTER — Ambulatory Visit: Payer: Medicare Other | Attending: Cardiology | Admitting: Pharmacist Clinician (PhC)/ Clinical Pharmacy Specialist

## 2022-12-07 ENCOUNTER — Other Ambulatory Visit (HOSPITAL_COMMUNITY): Payer: Self-pay

## 2022-12-07 DIAGNOSIS — E785 Hyperlipidemia, unspecified: Secondary | ICD-10-CM | POA: Diagnosis not present

## 2022-12-07 MED ORDER — CLOPIDOGREL BISULFATE 75 MG PO TABS: 75.0000 mg | ORAL_TABLET | Freq: Every day | ORAL | 3 refills | Status: DC

## 2022-12-07 NOTE — Assessment & Plan Note (Addendum)
Assessment: Patient with ASCVD not at LDL goal of < 55 Most recent LDL 109 on 10/2022 Has been compliant with high intensity statin :  Reviewed options for lowering LDL cholesterol - specifically PCSK9.  His insurance doesn't cover Nexletol or inclisiran, so did not discuss those options.   Discussed mechanisms of action, dosing, side effects, potential decreases in LDL cholesterol and costs.  Also reviewed potential options for patient assistance. Restarted fenofibrate at last appointment, will continue and recheck in 3 months Plan: Patient agreeable to starting repatha 140 mg q14d Repeat labs after:  3 monhts Lipid Liver function Patient was signed up for [employer] while in office today - required income verification, he will bring that information next Monday when he comes back to town.

## 2022-12-07 NOTE — Patient Instructions (Addendum)
Your Results:             Your most recent labs Goal  Total Cholesterol 196 < 200  Triglycerides 302 < 150  HDL (happy/good cholesterol) 35 > 40  LDL (lousy/bad cholesterol 109 < 55   Medication changes:  We will start the process to get Repatha covered by your insurance.  Once the prior authorization is complete, I will send the prescription to [place] [facility] and let you know that it has been approved.   You will take one injection every 14 days.  Lab orders:  We want to repeat labs after 2-3 months.  We will send you a lab order to remind you once we get closer to that time.    Patient Assistance:  We have signed you up for a [place] to cover the costs of your cholesterol medications.  Please bring a copy of your Social Security statement to the office, along with the [place] form and we will send it in to them.  Once they have confirmed everything, you will get your cholesterol medications at no charge until January 2025.    Thank you for choosing [facility]

## 2022-12-07 NOTE — Telephone Encounter (Signed)
P/a is Approved   Pharmacy Patient Advocate Encounter   Prior Authorization for Repatha 140MG/ML has been Approved.    KEY # [id number]   Effective dates: 2.20.24 through 8.20.24

## 2022-12-07 NOTE — Progress Notes (Unsigned)
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 male patient of Dr Sallyanne Kuster, in the office today to discuss options for cholesterol management.   His most recent labs (1/24) showed elevations in both LDL and triglycerides.  He is compliant with atorvastatin 80 mg, and recently re-started fenofibrate.    Insurance Carrier:  [place] [employer] Plan 1   Repatha $47/mo, $140 in coverage gap  Nexlezet - NOF  LDL Cholesterol goal:  LDL < 55  Current Medications:   atorvastatin 80, fenofibrate 135  Previously tried:  simvastatin   Family Hx:   father died in his 30's from heart disease, sister smoker heart disease died in her 26's; mother no heart disease, died at 66; one child, no known heart issue  Social Hx: Tobacco: quit - when DVT occurred Alcohol: no    Diet:  mostly home cooked meals, lives alone and doesn't know how to cook much, therefore lots of pasta and potatoes; some vegetables; doesn't care for meat; occasionally will eat out but not often    Exercise: stays busy, works Financial controller on [employer],   Adherence Assessment  Do you ever forget to take your medication? []$ Yes [x]$ No  Do you ever skip doses due to side effects? []$ Yes [x]$ No  Do you have trouble  affording your medicines? []$ Yes [x]$ No  Are you ever unable to pick up your medication due to transportation difficulties? []$ Yes [x]$ No  Do you ever stop taking your medications because you don't believe they are helping? []$ Yes [x]$ No   Adherence strategy: has bottles set out in particular pattern  Accessory Clinical Findings   Lab Results  Component Value Date   CHOL 196 11/03/2022   HDL 35 (L) 11/03/2022   LDLCALC 109 (H) 11/03/2022   TRIG 302 (H) 11/03/2022   CHOLHDL 5.6 (H) 11/03/2022    Lab Results  Component Value Date   ALT 11 11/03/2022   AST 19 11/03/2022   ALKPHOS 60 11/03/2022   BILITOT 0.5 11/03/2022   Lab Results  Component Value Date   CREATININE 1.44 (H) 09/28/2022   BUN 20 09/28/2022   NA 139 09/28/2022   K 5.7 (H) 09/28/2022   CL 100 09/28/2022   CO2 18 (L) 09/28/2022   Lab Results  Component Value Date   HGBA1C 5.1 06/03/2022    Home Medications    Current Outpatient Medications  Medication Sig Dispense Refill   albuterol (VENTOLIN HFA) 108 (90 Base) MCG/ACT inhaler Inhale 2 puffs into the lungs 5 (five) times daily.     allopurinol (ZYLOPRIM) 300 MG tablet Take 300 mg by mouth at bedtime.     atorvastatin (LIPITOR) 80 MG tablet Take 1 tablet (  80 mg total) by mouth daily. 90 tablet 3   Choline Fenofibrate (FENOFIBRIC ACID) 135 MG CPDR Take 135 mg by mouth daily. 90 capsule 3   empagliflozin (JARDIANCE) 10 MG TABS tablet Take 1 tablet (10 mg total) by mouth daily. 30 tablet 0   furosemide (LASIX) 20 MG tablet Take 1 tablet (20 mg total) by mouth daily. Start on Tuesday 8/22 90 tablet 3   losartan (COZAAR) 100 MG tablet TAKE 1 TABLET BY MOUTH DAILY 30 tablet 1   metoprolol succinate (TOPROL-XL) 100 MG 24 hr tablet Take 100 mg by mouth daily.      nitroGLYCERIN (NITROSTAT) 0.3 MG SL tablet Place under the tongue.     omeprazole (PRILOSEC) 40 MG capsule Take 40 mg by mouth at bedtime.     ondansetron (ZOFRAN) 8 MG tablet Take 8 mg by mouth 2 (two)  times daily as needed for vomiting or nausea.     spironolactone (ALDACTONE) 25 MG tablet Take 0.5 tablets (12.5 mg total) by mouth daily. 15 tablet 3   tamsulosin (FLOMAX) 0.4 MG CAPS capsule Take 0.4 mg by mouth every evening.     warfarin (COUMADIN) 5 MG tablet Take 5 mg by mouth See admin instructions. Take 1 tablet (5 mg) on Tuesday, Wednesday, Thursday, Saturday, Sunday. On Monday and Friday do not take. Or as directed by Coumadin clinic.     cilostazol (PLETAL) 100 MG tablet Take 1 tablet (100 mg total) by mouth 2 (two) times daily. Please schedule appointment for refills. (Patient not taking: Reported on 12/07/2022) 180 tablet 0   clopidogrel (PLAVIX) 75 MG tablet Take 1 tablet (75 mg total) by mouth daily. 90 tablet 3   diazepam (VALIUM) 2 MG tablet Take 2 mg by mouth in the morning and at bedtime. (Patient not taking: Reported on 09/28/2022)     traMADol (ULTRAM) 50 MG tablet Take 1 tablet (50 mg total) by mouth every 8 (eight) hours as needed for severe pain. (Patient not taking: Reported on 12/07/2022) 60 tablet 3   No current facility-administered medications for this visit.     Assessment & Plan    Hyperlipidemia Assessment: Patient with ASCVD not at LDL goal of < 55 Most recent LDL 109 on 10/2022 Has been compliant with high intensity statin :  Reviewed options for lowering LDL cholesterol - specifically PCSK9.  His insurance doesn't cover Nexletol or inclisiran, so did not discuss those options.   Discussed mechanisms of action, dosing, side effects, potential decreases in LDL cholesterol and costs.  Also reviewed potential options for patient assistance. Restarted fenofibrate at last appointment, will continue and recheck in 3 months Plan: Patient agreeable to starting repatha 140 mg q14d Repeat labs after:  3 monhts Lipid Liver function Patient was signed up for [employer] while in office today - required income verification, he will bring that information next Monday  when he comes back to town.     Tommy Medal, PharmD CPP [facility] [address] [place]  [place], [place] [address] [phone]  12/07/2022, 9:01 AM

## 2022-12-07 NOTE — Telephone Encounter (Signed)
Please start PA for Repatha

## 2022-12-08 MED ORDER — REPATHA SURECLICK 140 MG/ML ~~LOC~~ SOAJ: 140.0000 mg | SUBCUTANEOUS | 12 refills | Status: DC

## 2022-12-08 NOTE — Telephone Encounter (Signed)
Rx sent to [facility].  Patient applied for [employer] - needs income verification

## 2023-01-31 ENCOUNTER — Ambulatory Visit: Payer: Medicare Other | Attending: Cardiovascular Disease | Admitting: Cardiovascular Disease

## 2023-01-31 ENCOUNTER — Encounter: Payer: Self-pay | Admitting: Cardiovascular Disease

## 2023-01-31 VITALS — BP 131/54 | HR 66 | Ht 73.0 in | Wt 205.8 lb

## 2023-01-31 DIAGNOSIS — I7141 Pararenal abdominal aortic aneurysm, without rupture: Secondary | ICD-10-CM

## 2023-01-31 DIAGNOSIS — I739 Peripheral vascular disease, unspecified: Secondary | ICD-10-CM | POA: Diagnosis not present

## 2023-01-31 DIAGNOSIS — I10 Essential (primary) hypertension: Secondary | ICD-10-CM | POA: Diagnosis not present

## 2023-01-31 DIAGNOSIS — I7409 Other arterial embolism and thrombosis of abdominal aorta: Secondary | ICD-10-CM | POA: Diagnosis not present

## 2023-01-31 DIAGNOSIS — I25118 Atherosclerotic heart disease of native coronary artery with other forms of angina pectoris: Secondary | ICD-10-CM | POA: Diagnosis not present

## 2023-01-31 DIAGNOSIS — Z8719 Personal history of other diseases of the digestive system: Secondary | ICD-10-CM

## 2023-01-31 DIAGNOSIS — I5032 Chronic diastolic (congestive) heart failure: Secondary | ICD-10-CM

## 2023-01-31 DIAGNOSIS — Z8739 Personal history of other diseases of the musculoskeletal system and connective tissue: Secondary | ICD-10-CM

## 2023-01-31 DIAGNOSIS — R110 Nausea: Secondary | ICD-10-CM

## 2023-01-31 DIAGNOSIS — N1831 Chronic kidney disease, stage 3a: Secondary | ICD-10-CM

## 2023-01-31 DIAGNOSIS — I350 Nonrheumatic aortic (valve) stenosis: Secondary | ICD-10-CM | POA: Diagnosis not present

## 2023-01-31 DIAGNOSIS — G629 Polyneuropathy, unspecified: Secondary | ICD-10-CM

## 2023-01-31 DIAGNOSIS — I7122 Aneurysm of the aortic arch, without rupture: Secondary | ICD-10-CM

## 2023-01-31 DIAGNOSIS — E7800 Pure hypercholesterolemia, unspecified: Secondary | ICD-10-CM

## 2023-01-31 DIAGNOSIS — Z8711 Personal history of peptic ulcer disease: Secondary | ICD-10-CM

## 2023-01-31 DIAGNOSIS — Z86711 Personal history of pulmonary embolism: Secondary | ICD-10-CM

## 2023-01-31 MED ORDER — GABAPENTIN 100 MG PO CAPS: 100.0000 mg | ORAL_CAPSULE | Freq: Two times a day (BID) | ORAL | 3 refills | Status: DC

## 2023-01-31 MED ORDER — NITROGLYCERIN 0.3 MG SL SUBL: SUBLINGUAL_TABLET | SUBLINGUAL | 0 refills | Status: DC

## 2023-01-31 MED ORDER — EMPAGLIFLOZIN 10 MG PO TABS: 10.0000 mg | ORAL_TABLET | Freq: Every day | ORAL | 3 refills | Status: DC

## 2023-01-31 NOTE — Patient Instructions (Signed)
Medication Instructions:  Gabapentin 100mg  twice a day *If you need a refill on your cardiac medications before your next appointment, please call your pharmacy*   Testing/Procedures: Your physician has requested that you have an echocardiogram. Echocardiography is a painless test that uses sound waves to create images of your heart. It provides your doctor with information about the size and shape of your heart and how well your heart's chambers and valves are working. This procedure takes approximately one hour. There are no restrictions for this procedure. Please do NOT wear cologne, perfume, aftershave, or lotions (deodorant is allowed). Please arrive 15 minutes prior to your appointment time.    Follow-Up: At [facility], you and your health needs are our priority.  As part of our continuing mission to provide you with exceptional heart care, we have created designated Provider Care Teams.  These Care Teams include your primary Cardiologist (physician) and Advanced Practice Providers (APPs -  Physician Assistants and Nurse Practitioners) who all work together to provide you with the care you need, when you need it.  We recommend signing up for the patient portal called "MyChart".  Sign up information is provided on this After Visit Summary.  MyChart is used to connect with patients for Virtual Visits (Telemedicine).  Patients are able to view lab/test results, encounter notes, upcoming appointments, etc.  Non-urgent messages can be sent to your provider as well.   To learn more about what you can do with MyChart, go to [email or web address].    Your next appointment:   3-4 month(s)  Provider:   Thurmon Fair, MD

## 2023-01-31 NOTE — Progress Notes (Unsigned)
Cardiology Office Note:    Date:  02/01/2023   ID:  GRANITE GODMAN, DOB [birth year], MRN [id number]  PCP:  [name], MD   [facility] Providers Cardiologist:  Thurmon Fair, MD     Referring MD: [name], MD (retired, now sees Dr "Rosalia Hammers" in [place]).  Chief Complaint  Patient presents with   Cardiac Valve Problem   Coronary Artery Disease   PAD    History of Present Illness:    Tony Zhang is a 77 y.o. 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  He is on antiplatelet therapy with clopidogrel but aspirin has been stopped since he also takes warfarin for venous thromboembolic disease.  He is doing reasonably well from a cardiovascular point of view but is quite sedentary.  Does not have any orthopnea, PND, lower extremity edema.  Denies exertional chest pain or claudication and avoids pushing pushes himself to a level of activity at which he has shortness of breath.  Overall seems to have NYHA functional class 2-3A shortness of breath.  He has occasional mild orthostatic hypotension but has learned to sit at the side of the bed before jumping up.  He has not experienced syncope.  He denies palpitations.  Walking is limited by neuropathy.  He feels that he is constantly "walking on glass".  His leg and his back also hurt with activity.  His biggest complaint today is that of nausea.  He feels that he has been nauseated for about 6 months.  He has not had any vomiting.  He does have occasional  diarrhea.  He has good days and bad days and his symptoms do not appear to be associated with physical activity.  In 2016 he had a pyloric ulcer and had endoscopy performed by Dr. Bosie Clos.  He has not seen a gastroenterologist since then.  He has run out of refills on Jardiance.  He has not had problems with urinary tract infection/groin yeast infections/perineal inflammation since taking the medication.  He is compliant with Repatha for hypercholesterolemia.    He is on chronic warfarin anticoagulation and has not had falls or bleeding problems.  INR is followed in [place].  Blood pressure is generally higher in the right arm by about 20 mmHg and is currently well-controlled.   Past Medical History:  Diagnosis Date   AAA (abdominal aortic aneurysm)    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     COPD (chronic obstructive pulmonary disease)    DJD (degenerative joint disease)    Dyslipidemia    Gout    Malignant hypertension    Peripheral vascular disease    Pneumonia 08/2013   pt. reports that he is having this surgery 11/25/2014- for the lung problem that began with pneumonia in 09/2014   Shortness of breath    Venous thrombosis    Recurrent    Past Surgical History:  Procedure Laterality Date   APPENDECTOMY     BACK SURGERY     COLONOSCOPY N/A 04/13/2015   Procedure:  COLONOSCOPY;  Surgeon: Carman Ching, MD;  Location: [facility] ENDOSCOPY;  Service: Endoscopy;  Laterality: N/A;   CORONARY STENT INTERVENTION N/A 06/04/2022   Procedure: CORONARY STENT INTERVENTION;  Surgeon: Corky Crafts, MD;  Location: [facility] INVASIVE CV LAB;  Service: Cardiovascular;  Laterality: N/A;   EMPYEMA DRAINAGE N/A 11/25/2014   Procedure: EMPYEMA DRAINAGE;  Surgeon: Delight Ovens, MD;  Location: [facility] OR;  Service: Thoracic;  Laterality: N/A;   ESOPHAGOGASTRODUODENOSCOPY N/A 04/10/2015   Procedure: ESOPHAGOGASTRODUODENOSCOPY (EGD);  Surgeon:  Charlott Rakes, MD;  Location: [facility] ENDOSCOPY;  Service: Endoscopy;  Laterality: N/A;   EYE SURGERY     both eyes, cataracts removed, denies lens implants   FLEXIBLE SIGMOIDOSCOPY N/A 04/12/2015   Procedure: FLEXIBLE SIGMOIDOSCOPY;  Surgeon: Carman Ching, MD;  Location: [facility] ENDOSCOPY;  Service: Endoscopy;  Laterality: N/A;   HERNIA REPAIR     LEFT HEART CATH AND CORONARY ANGIOGRAPHY N/A 06/04/2022   Procedure: LEFT HEART CATH AND CORONARY ANGIOGRAPHY;  Surgeon: Corky Crafts, MD;  Location: [facility] INVASIVE CV LAB;  Service: Cardiovascular;  Laterality: N/A;   SHOULDER SURGERY Right    VIDEO ASSISTED THORACOSCOPY Right 11/25/2014   Procedure: VIDEO ASSISTED THORACOSCOPY;  Surgeon: Delight Ovens, MD;  Location: [facility] OR;  Service: Thoracic;  Laterality: Right;   VIDEO BRONCHOSCOPY N/A 11/25/2014   Procedure: VIDEO BRONCHOSCOPY;  Surgeon: Delight Ovens, MD;  Location: [facility] OR;  Service: Thoracic;  Laterality: N/A;    Current Medications: Current Meds  Medication Sig   albuterol (VENTOLIN HFA) 108 (90 Base) MCG/ACT inhaler Inhale 2 puffs into the lungs 5 (five) times daily.   allopurinol (ZYLOPRIM) 300 MG tablet Take 300 mg by mouth at bedtime.   atorvastatin (LIPITOR) 40 MG tablet Take 40 mg by mouth daily.   Choline Fenofibrate (FENOFIBRIC ACID) 135 MG CPDR Take 135 mg by mouth daily.   cilostazol (PLETAL) 100 MG tablet Take 1 tablet by mouth daily.   clopidogrel (PLAVIX) 75 MG tablet Take 1 tablet (75 mg total) by mouth daily.   diazepam (VALIUM) 2 MG tablet Take 2 mg by mouth in the morning and at bedtime.   Evolocumab (REPATHA SURECLICK) 140 MG/ML SOAJ Inject 140 mg into the skin every 14 (fourteen) days.   furosemide (LASIX) 20 MG tablet Take 1 tablet (20 mg total) by mouth daily. Start on Tuesday 8/22   gabapentin (NEURONTIN) 100 MG capsule Take 1 capsule (100 mg total) by mouth 2 (two) times daily.   losartan (COZAAR) 100 MG tablet TAKE 1 TABLET BY MOUTH DAILY   metoprolol succinate  (TOPROL-XL) 100 MG 24 hr tablet Take 100 mg by mouth daily.    omeprazole (PRILOSEC) 40 MG capsule Take 40 mg by mouth at bedtime.   spironolactone (ALDACTONE) 25 MG tablet Take 0.5 tablets (12.5 mg total) by mouth daily.   tamsulosin (FLOMAX) 0.4 MG CAPS capsule Take 0.4 mg by mouth every evening.   warfarin (COUMADIN) 2 MG tablet Take 2 mg by mouth daily. Patient takes 2 mg Monday and Tuesday, Wed Thurs ans Sunday patient takes 4 mg and Friday and Sat patient takes 2 mg     Allergies:   Codeine   Social History   Socioeconomic History   Marital status: Widowed    Spouse name: Not on file   Number of children: 0   Years of education: Not on file   Highest education level: 6th grade  Occupational History    Comment: semi retired  Tobacco Use   Smoking status:  Former    Types: Cigarettes    Quit date: 10/18/1992    Years since quitting: 30.3   Smokeless tobacco: Never  Vaping Use   Vaping Use: Never used  Substance and Sexual Activity   Alcohol use: No   Drug use: No   Sexual activity: Not on file  Other Topics Concern   Not on file  Social History Narrative   Not on file   Social Determinants of Health   Financial Resource Strain: Low Risk  (06/04/2022)   Overall Financial Resource Strain (CARDIA)    Difficulty of Paying Living Expenses: Not hard at all  Food Insecurity: No Food Insecurity (06/04/2022)   Hunger Vital Sign    Worried About Running Out of Food in the Last Year: Never true    Ran Out of Food in the Last Year: Never true  Transportation Needs: No Transportation Needs (06/04/2022)   PRAPARE - Administrator, Civil Service (Medical): No    Lack of Transportation (Non-Medical): No  Physical Activity: Not on file  Stress: Not on file  Social Connections: Not on file     Family History: The patient's family history includes CAD in his sister; CAD (age of onset: 69) in his father; Liver cancer in his brother.  ROS:   Please see the history of  present illness.     All other systems reviewed and are negative.  EKGs/Labs/Other Studies Reviewed:    The following studies were reviewed today:  CT angiogram of the aorta  1. No evidence of acute aortic syndrome. 2. 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 3. 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 Recommend annual imaging followup by CTA or MRA. This recommendation follows 2010 ACCF/AHA/AATS/ACR/ASA/SCA/SCAI/SIR/STS/SVM Guidelines for the Diagnosis and Management of Patients with Thoracic Aortic Disease. Circulation.2010; 121: Z610-R604. Aortic aneurysm NOS (ICD10-I71.9) 4.  Coronary and aortic Atherosclerosis (ICD10-I70.0).  Cardiac Cath 06/04/2022    Ost RCA to Prox RCA lesion is 80% stenosed.  Heavily calcified.  Would require atherectomy to fix.   Prox RCA lesion is 25% stenosed.   Mid RCA lesion is 70% stenosed.  Diffuse disease between the ostial lesion and this lesion.   3rd Mrg lesion is 75% stenosed.   1st Diag lesion is 50% stenosed.   Ost LAD to Mid LAD lesion is 25% stenosed.   Mid LAD lesion is 90% stenosed.   A drug-eluting stent was successfully placed using a SYNERGY XD 3.0X16, postdilated to greater than 3.5 mm.   Post intervention, there is a 0% residual stenosis.   The left ventricular systolic function is normal.   LV end diastolic pressure is normal.   The left ventricular ejection fraction is 50-55% by visual estimate.   There is no aortic valve stenosis.   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  There is moderate diffuse disease in the proximal LAD.  The culprit lesion was a severe stenosis in the mid LAD.  This was successfully treated with a 3.0 x 16 Synergy drug-eluting stent, postdilated to greater than 3.5 mm in diameter.   Resume IV heparin 2 hours after the TR band is removed.  Coumadin can also be resumed tonight.  Target INR  greater than 2.  Aspirin will be used for 1 month and then stop.  Clopidogrel will be continued for ideally 12 months if no bleeding problems.   Plan discussed with Dr. Excell Seltzer who  is the primary cardiologist.  Would plan medical therapy for his residual disease.    Diagnostic Dominance: Co-dominant  Intervention    Permanent Stent  Synergy Xd 3.0x16    Echocardiogram 06/03/2022    1. Left ventricular ejection fraction, by estimation, is 50 to 55%. The  left ventricle has low normal function. The left ventricle demonstrates  regional wall motion abnormalities (see scoring diagram/findings for  description). Left ventricular diastolic   parameters are consistent with Grade I diastolic dysfunction (impaired  relaxation). Elevated left ventricular end-diastolic pressure. The E/e' is  66. There is severe hypokinesis of the left ventricular, mid-apical  inferior wall, inferoseptal wall and  apical segment. The apex appears anerusymal.   2. The mitral valve is abnormal. Trivial mitral valve regurgitation.   3. The aortic valve is calcified. There is severe calcifcation of the  aortic valve. Moderate to severe aortic valve stenosis. Aortic valve area,  by VTI measures 0.48 cm. Aortic valve mean gradient measures 36.0 mmHg.  Aortic valve Vmax measures 3.57  m/s. Peak gradient 51 mmHg, DI 0.21.   4. There is normal pulmonary artery systolic pressure.   5. The inferior vena cava is normal in size with greater than 50%  respiratory variability, suggesting right atrial pressure of 3 mmHg.   Comparison(s): Prior images reviewed side by side. Changes from prior  study are noted. 02/22/2022: LVEF >75%, mild (probably moderate) AS - mean  gradient 16 mmHg, DI 0.33.   AORTIC VALVE  AV Area (Vmax):    0.47 cm  AV Area (Vmean):   0.46 cm  AV Area (VTI):     0.48 cm  AV Vmax:           357.00 cm/s  AV Vmean:          292.000 cm/s  AV VTI:            0.898 m  AV Peak Grad:      51.0 mmHg   AV Mean Grad:      36.0 mmHg  LVOT Vmax:         74.20 cm/s  LVOT Vmean:        59.800 cm/s  LVOT VTI:          0.189 m  LVOT/AV VTI ratio: 0.21    EKG:  EKG is ordered today.  Shows normal sinus rhythm, left axis deviation and old inferior wall myocardial infarction, anterolateral myocardial infarction, QTc 394 ms, no acute ischemic repolarization abnormalities.   Recent Labs: 06/02/2022: B Natriuretic Peptide 1,245.2; TSH 0.818 06/06/2022: Hemoglobin 15.2; Platelets 251 09/08/2022: NT-Pro BNP 432 09/28/2022: BUN 20; Creatinine, Ser 1.44; Potassium 5.7; Sodium 139 11/03/2022: ALT 11  Recent Lipid Panel    Component Value Date/Time   CHOL 196 11/03/2022 0821   TRIG 302 (H) 11/03/2022 0821   HDL 35 (L) 11/03/2022 0821   CHOLHDL 5.6 (H) 11/03/2022 0821   CHOLHDL 3.9 06/03/2022 0257   VLDL 19 06/03/2022 0257   LDLCALC 109 (H) 11/03/2022 0821     Risk Assessment/Calculations:                Physical Exam:    VS:  BP (!) 131/54 (BP Location: Left Arm, Patient Position: Sitting, Cuff Size: Normal)   Pulse 66   Ht  (1.854 m)   Wt 205 lb 12.8 oz (93.4 kg)   SpO2 95%   BMI 27.15 kg/m     Wt Readings from Last 3 Encounters:  01/31/23 205  lb 12.8 oz (93.4 kg)  09/28/22 211 lb 12.8 oz (96.1 kg)  08/27/22 215 lb 12.8 oz (97.9 kg)      General: Alert, oriented x3, no distress Head: no evidence of trauma, PERRL, EOMI, no exophtalmos or lid lag, no myxedema, no xanthelasma; normal ears, nose and oropharynx Neck: normal jugular venous pulsations and no hepatojugular reflux; brisk carotid pulses without delay and no carotid bruits Chest: Diminished breath sounds throughout, but otherwise clear to auscultation, no signs of consolidation by percussion or palpation, normal fremitus, symmetrical and full respiratory excursions Cardiovascular: normal position and quality of the apical impulse, regular rhythm, normal first and barely audible second heart sounds, 3/6 aortic ejection  murmur is mid peaking, no diastolic murmurs, rubs or gallops Abdomen: no tenderness or distention, no masses by palpation, no abnormal pulsatility or arterial bruits, normal bowel sounds, no hepatosplenomegaly Extremities: No pulses are Above in either foot, but he has warm extremities and good capillary refill Neurological: grossly nonfocal Psych: Normal mood and affect   ASSESSMENT:    1. Aortic valve stenosis, nonrheumatic   2. Coronary artery disease of native artery of native heart with stable angina pectoris   3. PAD (peripheral artery disease)   4. Chronic distal aortic occlusion (HCC)   5. 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 Chronic diastolic heart failure, NYHA class 2   8. Essential hypertension   9. Pure hypercholesterolemia   10. History of pulmonary embolism   11. History of gastric ulcer   12. Stage 3a chronic kidney disease   13. Neuropathy   14. History of gout   15. Nausea     PLAN:    In order of problems listed above:  AS: Echo measurements are consistent with paradoxical low-flow low gradient severe aortic stenosis.  Will repeat an echocardiogram.  Does not have exertional symptoms, but is also very sedentary due to his PAD, multiple orthopedic problems and COPD.   A. Femoral access for TAVR is not an option.  On my review of the CT angiogram of his aorta he appears to have reasonably good access from the left subclavian and left carotid arteries.  Although his blood pressure is a little lower on the left, I do not see clear evidence of left subclavian stenosis.  It is also important to note that his subclavian arteries appear to provide a lot of the flow to the lower half of his body via dense collateral circulation between the internal mammary arteries and the inferior epigastric arteries.  B. in addition he has a ostial right coronary artery stenosis that is heavily calcified and would  require atherectomy.  I this could make him better suited  for SAVR, but his numerous comorbidities may preclude that.  Will review in the structural heart team meeting. CAD: Currently asymptomatic.  Had a drug-eluting stent placed for ACS in the LAD artery in late August 2023.  Known to have a heavily calcified ostial right coronary artery stenosis, but this may be best left for medical therapy since it will be a challenge and atherectomy.  Not on aspirin due to concomitant anticoagulation with warfarin.  On clopidogrel monotherapy. PAD/chronic occlusion of the infrarenal abdominal aorta: He does not have claudication, may be because he is fairly sedentary.  Longstanding history of occlusion of the distal abdominal aorta with reconstitution at the level of the common femoral arteries via inferior epigastric arteries.  Based on previous  imaging studies does not have a whole lot of disease in the infrapopliteal distribution.   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  They both are appropriate for routine imaging follow-up on a yearly basis.  They do not require surgery at this time. CHF: Borderline LVEF 50-55%.  Clinically euvolemic, NYHA functional class II-3A on Jardiance, spironolactone, ARB, low-dose loop diuretic HTN: May be related to renal artery stenosis.  Blood pressure is quite high.  Resume losartan.  He may have had progression of renal artery stenosis.  We will reevaluate this when we do CT angiography as part of his evaluation for possible TAVR. HLP: Target LDL less than 70.  Currently on atorvastatin 40 mg + Repatha.  Need to recheck his lipid profile.  Note that he has a severely elevated LP(a).  May be a candidate for inclisiran. History of DVT/PE: Incidentally noted to have a persistent left-sided inferior vena cava.  On chronic warfarin for over 20 years.  Was seeing Dr.  Truett Perna in the hematology clinic for hypercoagulable state.  INR is monitored in Dr. Fredirick Maudlin office in [place].  Avoid NSAIDs.  Prefer acetaminophen or tramadol.  He prefers not to take oxycodone since this makes him feel unwell. CKD3A: Baseline creatinine seems to be 1.3-1.5, corresponding to a GFR 45-50. Neuropathy: "Like walking on glass".  Started gabapentin 100 mg twice daily today.  Not sure if this is mostly due to ischemia or his lumbar spine disease. History of gout: Avoid thiazide diuretics. Persistent nausea: Does not have other symptoms that suggest mesenteric ischemia.  Has not been losing weight.  Has a history of gastric ulcer.  On chronic PPI.  Will refer him to Dr. Bosie Clos to see if he thinks endoscopy is indicated.           Medication Adjustments/Labs and Tests Ordered: Current medicines are reviewed at length with the patient today.  Concerns regarding medicines are outlined above.  Orders Placed This Encounter  Procedures   Ambulatory referral to Structural Heart/Valve Clinic (only at [facility])   Ambulatory referral to Gastroenterology   EKG 12-Lead   ECHOCARDIOGRAM COMPLETE   Meds ordered this encounter  Medications   empagliflozin (JARDIANCE) 10 MG TABS tablet    Sig: Take 1 tablet (10 mg total) by mouth daily.    Dispense:  90 tablet    Refill:  3   gabapentin (NEURONTIN) 100 MG capsule    Sig: Take 1 capsule (100 mg total) by mouth 2 (two) times daily.    Dispense:  180 capsule    Refill:  3   nitroGLYCERIN (NITROSTAT) 0.3 MG SL tablet    Sig: Place under tongue. May take every 5 minutes up to 3 doses for chest pain. If still having pain after 3 doses, call 911.    Dispense:  30 tablet    Refill:  0    Patient Instructions  Medication Instructions:  Gabapentin 100mg  twice a day *If you need a refill on your cardiac medications before your next appointment, please call your pharmacy*   Testing/Procedures: Your physician has requested that you  have an echocardiogram. Echocardiography is a painless test that uses sound waves to create images of your heart. It provides your doctor with information about the size and shape of your heart and how well your heart's chambers and valves are working. This procedure takes approximately one hour. There are  no restrictions for this procedure. Please do NOT wear cologne, perfume, aftershave, or lotions (deodorant is allowed). Please arrive 15 minutes prior to your appointment time.    Follow-Up: At [facility], you and your health needs are our priority.  As part of our continuing mission to provide you with exceptional heart care, we have created designated Provider Care Teams.  These Care Teams include your primary Cardiologist (physician) and Advanced Practice Providers (APPs -  Physician Assistants and Nurse Practitioners) who all work together to provide you with the care you need, when you need it.  We recommend signing up for the patient portal called "MyChart".  Sign up information is provided on this After Visit Summary.  MyChart is used to connect with patients for Virtual Visits (Telemedicine).  Patients are able to view lab/test results, encounter notes, upcoming appointments, etc.  Non-urgent messages can be sent to your provider as well.   To learn more about what you can do with MyChart, go to [email or web address].    Your next appointment:   3-4 month(s)  Provider:   Thurmon Fair, MD        Signed, Thurmon Fair, MD  02/01/2023 2:52 PM    [facility]

## 2023-02-01 ENCOUNTER — Encounter: Payer: Self-pay | Admitting: Cardiovascular Disease

## 2023-02-25 ENCOUNTER — Encounter (HOSPITAL_COMMUNITY): Payer: Self-pay

## 2023-02-25 ENCOUNTER — Emergency Department (HOSPITAL_COMMUNITY): Payer: Medicare Other

## 2023-02-25 ENCOUNTER — Inpatient Hospital Stay (HOSPITAL_COMMUNITY)
Admission: EM | Admit: 2023-02-25 | Discharge: 2023-02-28 | DRG: 189 | Disposition: A | Discharge status: Home / Self Care | Payer: Medicare Other | Attending: Student | Admitting: Student

## 2023-02-25 ENCOUNTER — Other Ambulatory Visit: Payer: Self-pay

## 2023-02-25 DIAGNOSIS — I739 Peripheral vascular disease, unspecified: Secondary | ICD-10-CM | POA: Diagnosis present

## 2023-02-25 DIAGNOSIS — J9601 Acute respiratory failure with hypoxia: Secondary | ICD-10-CM | POA: Diagnosis not present

## 2023-02-25 DIAGNOSIS — N179 Acute kidney failure, unspecified: Secondary | ICD-10-CM | POA: Diagnosis present

## 2023-02-25 DIAGNOSIS — Z79899 Other long term (current) drug therapy: Secondary | ICD-10-CM

## 2023-02-25 DIAGNOSIS — I350 Nonrheumatic aortic (valve) stenosis: Secondary | ICD-10-CM

## 2023-02-25 DIAGNOSIS — N400 Enlarged prostate without lower urinary tract symptoms: Secondary | ICD-10-CM | POA: Diagnosis present

## 2023-02-25 DIAGNOSIS — I252 Old myocardial infarction: Secondary | ICD-10-CM

## 2023-02-25 DIAGNOSIS — J441 Chronic obstructive pulmonary disease with (acute) exacerbation: Secondary | ICD-10-CM

## 2023-02-25 DIAGNOSIS — E785 Hyperlipidemia, unspecified: Secondary | ICD-10-CM | POA: Diagnosis present

## 2023-02-25 DIAGNOSIS — E860 Dehydration: Secondary | ICD-10-CM | POA: Diagnosis present

## 2023-02-25 DIAGNOSIS — R791 Abnormal coagulation profile: Secondary | ICD-10-CM

## 2023-02-25 DIAGNOSIS — I5032 Chronic diastolic (congestive) heart failure: Secondary | ICD-10-CM | POA: Diagnosis present

## 2023-02-25 DIAGNOSIS — K219 Gastro-esophageal reflux disease without esophagitis: Secondary | ICD-10-CM | POA: Diagnosis present

## 2023-02-25 DIAGNOSIS — Z1152 Encounter for screening for COVID-19: Secondary | ICD-10-CM

## 2023-02-25 DIAGNOSIS — I130 Hypertensive heart and chronic kidney disease with heart failure and stage 1 through stage 4 chronic kidney disease, or unspecified chronic kidney disease: Secondary | ICD-10-CM | POA: Diagnosis present

## 2023-02-25 DIAGNOSIS — Z87891 Personal history of nicotine dependence: Secondary | ICD-10-CM

## 2023-02-25 DIAGNOSIS — M109 Gout, unspecified: Secondary | ICD-10-CM | POA: Diagnosis present

## 2023-02-25 DIAGNOSIS — I701 Atherosclerosis of renal artery: Secondary | ICD-10-CM | POA: Diagnosis present

## 2023-02-25 DIAGNOSIS — I7122 Aneurysm of the aortic arch, without rupture: Secondary | ICD-10-CM | POA: Diagnosis present

## 2023-02-25 DIAGNOSIS — I2510 Atherosclerotic heart disease of native coronary artery without angina pectoris: Secondary | ICD-10-CM | POA: Diagnosis present

## 2023-02-25 DIAGNOSIS — Z885 Allergy status to narcotic agent status: Secondary | ICD-10-CM

## 2023-02-25 DIAGNOSIS — N1831 Chronic kidney disease, stage 3a: Secondary | ICD-10-CM | POA: Diagnosis present

## 2023-02-25 DIAGNOSIS — Z7901 Long term (current) use of anticoagulants: Secondary | ICD-10-CM

## 2023-02-25 DIAGNOSIS — G629 Polyneuropathy, unspecified: Secondary | ICD-10-CM | POA: Diagnosis present

## 2023-02-25 DIAGNOSIS — D6859 Other primary thrombophilia: Secondary | ICD-10-CM | POA: Diagnosis present

## 2023-02-25 DIAGNOSIS — Z7902 Long term (current) use of antithrombotics/antiplatelets: Secondary | ICD-10-CM

## 2023-02-25 DIAGNOSIS — E7800 Pure hypercholesterolemia, unspecified: Secondary | ICD-10-CM | POA: Diagnosis present

## 2023-02-25 DIAGNOSIS — Z9861 Coronary angioplasty status: Secondary | ICD-10-CM

## 2023-02-25 DIAGNOSIS — Z86718 Personal history of other venous thrombosis and embolism: Secondary | ICD-10-CM

## 2023-02-25 DIAGNOSIS — Z7984 Long term (current) use of oral hypoglycemic drugs: Secondary | ICD-10-CM

## 2023-02-25 DIAGNOSIS — Z8249 Family history of ischemic heart disease and other diseases of the circulatory system: Secondary | ICD-10-CM

## 2023-02-25 DIAGNOSIS — Z86711 Personal history of pulmonary embolism: Secondary | ICD-10-CM | POA: Diagnosis present

## 2023-02-25 DIAGNOSIS — E538 Deficiency of other specified B group vitamins: Secondary | ICD-10-CM | POA: Diagnosis present

## 2023-02-25 LAB — TROPONIN I (HIGH SENSITIVITY)
Troponin I (High Sensitivity): 10 ng/L (ref <18)
Troponin I (High Sensitivity): 11 ng/L (ref <18)

## 2023-02-25 LAB — BASIC METABOLIC PANEL
Anion gap: 12 (ref 5–15)
BUN: 31 mg/dL — ABNORMAL HIGH (ref 8–23)
CO2: 21 mmol/L — ABNORMAL LOW (ref 22–32)
Calcium: 8.5 mg/dL — ABNORMAL LOW (ref 8.9–10.3)
Chloride: 104 mmol/L (ref 98–111)
Creatinine, Ser: 2.23 mg/dL — ABNORMAL HIGH (ref 0.61–1.24)
GFR, Estimated: 30 mL/min — ABNORMAL LOW (ref >60)
Glucose, Bld: 148 mg/dL — ABNORMAL HIGH (ref 70–99)
Potassium: 4.4 mmol/L (ref 3.5–5.1)
Sodium: 137 mmol/L (ref 135–145)

## 2023-02-25 LAB — SARS CORONAVIRUS 2 BY RT PCR: SARS Coronavirus 2 by RT PCR: NEGATIVE

## 2023-02-25 LAB — CBC WITH DIFFERENTIAL/PLATELET
Abs Immature Granulocytes: 0.09 10*3/uL — ABNORMAL HIGH (ref 0.00–0.07)
Basophils Absolute: 0 10*3/uL (ref 0.0–0.1)
Basophils Relative: 0 %
Eosinophils Absolute: 0.2 10*3/uL (ref 0.0–0.5)
Eosinophils Relative: 2 %
HCT: 42.1 % (ref 39.0–52.0)
Hemoglobin: 13.5 g/dL (ref 13.0–17.0)
Immature Granulocytes: 1 %
Lymphocytes Relative: 6 %
Lymphs Abs: 0.5 10*3/uL — ABNORMAL LOW (ref 0.7–4.0)
MCH: 31.4 pg (ref 26.0–34.0)
MCHC: 32.1 g/dL (ref 30.0–36.0)
MCV: 97.9 fL (ref 80.0–100.0)
Monocytes Absolute: 1 10*3/uL (ref 0.1–1.0)
Monocytes Relative: 11 %
Neutro Abs: 6.8 10*3/uL (ref 1.7–7.7)
Neutrophils Relative %: 80 %
Platelets: 222 10*3/uL (ref 150–400)
RBC: 4.3 MIL/uL (ref 4.22–5.81)
RDW: 16.2 % — ABNORMAL HIGH (ref 11.5–15.5)
WBC: 8.5 10*3/uL (ref 4.0–10.5)
nRBC: 0 % (ref 0.0–0.2)

## 2023-02-25 LAB — PROTIME-INR
INR: 1.9 — ABNORMAL HIGH (ref 0.8–1.2)
Prothrombin Time: 22.1 seconds — ABNORMAL HIGH (ref 11.4–15.2)

## 2023-02-25 LAB — URINALYSIS, ROUTINE W REFLEX MICROSCOPIC
Bilirubin Urine: NEGATIVE
Glucose, UA: 150 mg/dL — AB
Hgb urine dipstick: NEGATIVE
Ketones, ur: NEGATIVE mg/dL
Leukocytes,Ua: NEGATIVE
Nitrite: NEGATIVE
Protein, ur: NEGATIVE mg/dL
Specific Gravity, Urine: 1.011 (ref 1.005–1.030)
pH: 6 (ref 5.0–8.0)

## 2023-02-25 LAB — LACTIC ACID, PLASMA: Lactic Acid, Venous: 1.7 mmol/L (ref 0.5–1.9)

## 2023-02-25 LAB — BRAIN NATRIURETIC PEPTIDE: B Natriuretic Peptide: 370.4 pg/mL — ABNORMAL HIGH (ref 0.0–100.0)

## 2023-02-25 LAB — D-DIMER, QUANTITATIVE: D-Dimer, Quant: 0.71 ug/mL-FEU — ABNORMAL HIGH (ref 0.00–0.50)

## 2023-02-25 MED ORDER — ACETAMINOPHEN 650 MG RE SUPP: 650.0000 mg | Freq: Four times a day (QID) | RECTAL | Status: DC | PRN

## 2023-02-25 MED ORDER — SODIUM CHLORIDE 0.9 % IV SOLN: INTRAVENOUS | Status: DC

## 2023-02-25 MED ORDER — WARFARIN SODIUM 4 MG PO TABS
4.0000 mg | ORAL_TABLET | Freq: Once | ORAL | Status: AC
  Administered 2023-02-25: 4 mg via ORAL
  Filled 2023-02-25 (×2): qty 1

## 2023-02-25 MED ORDER — PREDNISONE 20 MG PO TABS
40.0000 mg | ORAL_TABLET | Freq: Every day | ORAL | Status: DC
  Administered 2023-02-26 – 2023-02-28 (×3): 40 mg via ORAL
  Filled 2023-02-25 (×3): qty 2

## 2023-02-25 MED ORDER — SODIUM CHLORIDE 0.9 % IV BOLUS
500.0000 mL | Freq: Once | INTRAVENOUS | Status: AC
  Administered 2023-02-25: 500 mL via INTRAVENOUS

## 2023-02-25 MED ORDER — HYDRALAZINE HCL 20 MG/ML IJ SOLN
5.0000 mg | Freq: Four times a day (QID) | INTRAMUSCULAR | Status: DC | PRN
  Administered 2023-02-28: 5 mg via INTRAVENOUS
  Filled 2023-02-25: qty 1

## 2023-02-25 MED ORDER — GUAIFENESIN ER 600 MG PO TB12
600.0000 mg | ORAL_TABLET | Freq: Two times a day (BID) | ORAL | Status: DC
  Administered 2023-02-25 – 2023-02-28 (×6): 600 mg via ORAL
  Filled 2023-02-25 (×6): qty 1

## 2023-02-25 MED ORDER — IPRATROPIUM-ALBUTEROL 0.5-2.5 (3) MG/3ML IN SOLN
3.0000 mL | Freq: Once | RESPIRATORY_TRACT | Status: AC
  Administered 2023-02-25: 3 mL via RESPIRATORY_TRACT
  Filled 2023-02-25: qty 3

## 2023-02-25 MED ORDER — METHYLPREDNISOLONE SODIUM SUCC 125 MG IJ SOLR
125.0000 mg | Freq: Once | INTRAMUSCULAR | Status: AC
  Administered 2023-02-25: 125 mg via INTRAVENOUS
  Filled 2023-02-25: qty 2

## 2023-02-25 MED ORDER — WARFARIN - PHARMACIST DOSING INPATIENT: Freq: Every day | Status: DC

## 2023-02-25 MED ORDER — ALBUTEROL SULFATE (2.5 MG/3ML) 0.083% IN NEBU
2.5000 mg | INHALATION_SOLUTION | Freq: Once | RESPIRATORY_TRACT | Status: AC
  Administered 2023-02-25: 2.5 mg via RESPIRATORY_TRACT
  Filled 2023-02-25: qty 3

## 2023-02-25 MED ORDER — IPRATROPIUM-ALBUTEROL 0.5-2.5 (3) MG/3ML IN SOLN: 3.0000 mL | Freq: Four times a day (QID) | RESPIRATORY_TRACT | Status: DC | PRN

## 2023-02-25 MED ORDER — ACETAMINOPHEN 325 MG PO TABS: 650.0000 mg | ORAL_TABLET | Freq: Four times a day (QID) | ORAL | Status: DC | PRN

## 2023-02-25 NOTE — Progress Notes (Signed)
ANTICOAGULATION CONSULT NOTE - Initial Consult  Pharmacy Consult for warfarin Indication: Hx PE/DVT  Allergies  Allergen Reactions   Codeine Hives and Other (See Comments)    Takes benadryl to stop allergic reactions    Patient Measurements:    Vital Signs: Temp: 99 F (37.2 C) (05/10 2020) Temp Source: Oral (05/10 1559) BP: 163/77 (05/10 2130) Pulse Rate: 83 (05/10 2200)  Labs: Recent Labs    02/25/23 1613 02/25/23 1814  HGB 13.5  --   HCT 42.1  --   PLT 222  --   LABPROT 22.1*  --   INR 1.9*  --   CREATININE 2.23*  --   TROPONINIHS 10 11    CrCl cannot be calculated (Unknown ideal weight.).   Medical History: Past Medical History:  Diagnosis Date   AAA (abdominal aortic aneurysm) (HCC)    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     COPD (chronic obstructive pulmonary disease) (HCC)    DJD (degenerative joint disease)    Dyslipidemia    Gout    Malignant hypertension    Peripheral vascular disease (HCC)    Pneumonia 08/2013   pt. reports that he is having this surgery 11/25/2014- for the lung problem that began with pneumonia in 09/2014   Shortness of breath    Venous thrombosis    Recurrent    Assessment: 76 YOM presenting with dyspnea, hx of VTE on warfarin PTA with INR on admission subtherapeutic 1.9, pt states last dose 5/9  PTA dosing: 2mg  daily except 4mg  on Sat/Sun  Goal of Therapy:  INR 2-3 Monitor platelets by anticoagulation protocol: Yes   Plan:  Warfarin 4mg  PO x 1 tonight Daily INR, s/s bleeding  Daylene Posey, PharmD, [facility] Clinical Pharmacist ED Pharmacist Phone # [phone] 02/25/2023 10:14 PM

## 2023-02-25 NOTE — ED Notes (Signed)
Pt transported to CT via stretcher.  

## 2023-02-25 NOTE — H&P (Signed)
History and Physical    Tony Zhang ZOX:[id number] DOB: 1946/04/24 DOA: 02/25/2023  PCP: [name], MD  Patient coming from: Home  Chief Complaint: Chest pain, shortness of breath  HPI: Tony Zhang is a 77 y.o. 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  Oxygen saturation 89% on room air with EMS and was placed on 2 L supplemental oxygen.  In the ED, patient was afebrile.  Blood pressure elevated with systolic in the 150s.  Satting in the 90s on 3 L [place].  Labs showing WBC 8.5, hemoglobin 13.5, platelet count 222k, sodium 137, potassium 4.4, chloride 104, bicarb 21, BUN 31, creatinine 2.2 (baseline 1.2-1.4), glucose 148, INR 1.9, BNP 370, troponin negative x 2, SARS-CoV-2 PCR negative, age-adjusted D-dimer within normal range, lactic acid normal, blood cultures drawn.  CT chest without contrast negative for pneumonia or pulmonary edema.  Showing stable aortic arch aneurysm.  He was wheezing and given albuterol neb, DuoNeb, and Solu-Medrol 125 mg.  Patient also received 500 cc normal saline bolus in the ED.  [facility] called to admit.  History per patient: He is having shortness of breath and cough for several weeks and went to urgent care 2 weeks ago and was prescribed a 1 week course of antibiotics which he finished a week ago but his symptoms have not improved.  Shortness of breath is worse with exertion.  He is also wheezing and using his home albuterol inhaler 4 times a day.  Yesterday he started having chills.  Denies chest pain.  He reports history of intermittent diarrhea since last year but no longer having any diarrhea at this  time.  No abdominal pain, nausea, or vomiting..  No other complaints.  Review of Systems:  Review of Systems  All other systems reviewed and are negative.   Past Medical History:  Diagnosis Date   AAA (abdominal aortic aneurysm) (HCC)    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     COPD (chronic obstructive pulmonary disease) (HCC)    DJD (degenerative joint disease)    Dyslipidemia    Gout    Malignant hypertension    Peripheral vascular disease (HCC)    Pneumonia 08/2013   pt. reports that he is having this surgery 11/25/2014- for the lung problem that began with pneumonia in 09/2014   Shortness of breath    Venous thrombosis    Recurrent    Past Surgical History:  Procedure Laterality Date   APPENDECTOMY     BACK SURGERY     COLONOSCOPY N/A 04/13/2015   Procedure: COLONOSCOPY;  Surgeon: Carman Ching, MD;  Location: [facility] ENDOSCOPY;  Service: Endoscopy;  Laterality: N/A;   CORONARY STENT INTERVENTION N/A 06/04/2022   Procedure: CORONARY STENT INTERVENTION;  Surgeon: Corky Crafts, MD;  Location: [facility] INVASIVE CV LAB;  Service: Cardiovascular;  Laterality: N/A;   EMPYEMA DRAINAGE N/A 11/25/2014   Procedure: EMPYEMA DRAINAGE;  Surgeon: Delight Ovens, MD;  Location: [facility] OR;  Service: Thoracic;  Laterality: N/A;   ESOPHAGOGASTRODUODENOSCOPY N/A 04/10/2015   Procedure: ESOPHAGOGASTRODUODENOSCOPY (EGD);  Surgeon: Charlott Rakes, MD;  Location: [facility] ENDOSCOPY;  Service:  Endoscopy;  Laterality: N/A;   EYE SURGERY     both eyes, cataracts removed, denies lens implants   FLEXIBLE SIGMOIDOSCOPY N/A 04/12/2015   Procedure: FLEXIBLE SIGMOIDOSCOPY;  Surgeon: Carman Ching, MD;  Location: [facility] ENDOSCOPY;  Service: Endoscopy;  Laterality: N/A;   HERNIA REPAIR     LEFT HEART CATH AND CORONARY ANGIOGRAPHY N/A 06/04/2022   Procedure: LEFT HEART CATH AND CORONARY ANGIOGRAPHY;  Surgeon: Corky Crafts, MD;  Location: [facility] INVASIVE  CV LAB;  Service: Cardiovascular;  Laterality: N/A;   SHOULDER SURGERY Right    VIDEO ASSISTED THORACOSCOPY Right 11/25/2014   Procedure: VIDEO ASSISTED THORACOSCOPY;  Surgeon: Delight Ovens, MD;  Location: [facility] OR;  Service: Thoracic;  Laterality: Right;   VIDEO BRONCHOSCOPY N/A 11/25/2014   Procedure: VIDEO BRONCHOSCOPY;  Surgeon: Delight Ovens, MD;  Location: [facility] OR;  Service: Thoracic;  Laterality: N/A;     reports that he quit smoking about 30 years ago. His smoking use included cigarettes. He has never used smokeless tobacco. He reports that he does not drink alcohol and does not use drugs.  Allergies  Allergen Reactions   Codeine Hives and Other (See Comments)    Takes benadryl to stop allergic reactions    Family History  Problem Relation Age of Onset   CAD Father 32   Liver cancer Brother    CAD Sister     Prior to Admission medications   Medication Sig Start Date End Date Taking? Authorizing Provider  albuterol (VENTOLIN HFA) 108 (90 Base) MCG/ACT inhaler Inhale 2 puffs into the lungs 5 (five) times daily.    [provider]  allopurinol (ZYLOPRIM) 300 MG tablet Take 300 mg by mouth at bedtime.    [provider]  atorvastatin (LIPITOR) 40 MG tablet Take 40 mg by mouth daily. 07/08/22   [provider]  Choline Fenofibrate (FENOFIBRIC ACID) 135 MG CPDR Take 135 mg by mouth daily. 09/28/22 01/31/23  Ronney Asters, NP  cilostazol (PLETAL) 100 MG tablet Take 1 tablet by mouth daily.    [provider]  clopidogrel (PLAVIX) 75 MG tablet Take 1 tablet (75 mg total) by mouth daily. 12/07/22   Croitoru, Mihai, MD  diazepam (VALIUM) 2 MG tablet Take 2 mg by mouth in the morning and at bedtime.    [provider]  empagliflozin (JARDIANCE) 10 MG TABS tablet Take 1 tablet (10 mg total) by mouth daily. 01/31/23   Croitoru, Mihai, MD  Evolocumab (REPATHA SURECLICK) 140 MG/ML SOAJ Inject 140 mg into the skin every 14 (fourteen) days. 12/08/22    Croitoru, Mihai, MD  furosemide (LASIX) 20 MG tablet Take 1 tablet (20 mg total) by mouth daily. Start on Tuesday 8/22 09/28/22   Ronney Asters, NP  gabapentin (NEURONTIN) 100 MG capsule Take 1 capsule (100 mg total) by mouth 2 (two) times daily. 01/31/23   Croitoru, Mihai, MD  losartan (COZAAR) 100 MG tablet TAKE 1 TABLET BY MOUTH DAILY 10/26/22   Ronney Asters, NP  metoprolol succinate (TOPROL-XL) 100 MG 24 hr tablet Take 100 mg by mouth daily.     [provider]  nitroGLYCERIN (NITROSTAT) 0.3 MG SL tablet Place under tongue. May take every 5 minutes up to 3 doses for chest pain. If still having pain after 3 doses, call 911. 01/31/23   Croitoru, Mihai, MD  omeprazole (PRILOSEC) 40 MG capsule Take 40 mg by mouth at bedtime.    [provider]  ondansetron (ZOFRAN) 8 MG tablet Take  8 mg by mouth 2 (two) times daily as needed for vomiting or nausea. Patient not taking: Reported on 01/31/2023 05/25/22   [provider]  spironolactone (ALDACTONE) 25 MG tablet Take 0.5 tablets (12.5 mg total) by mouth daily. 06/18/22   [name] M, PA-C  tamsulosin (FLOMAX) 0.4 MG CAPS capsule Take 0.4 mg by mouth every evening.    [provider]  warfarin (COUMADIN) 2 MG tablet Take 2 mg by mouth daily. Patient takes 2 mg Monday and Tuesday, Wed Thurs ans Sunday patient takes 4 mg and Friday and Sat patient takes 2 mg    [provider]    Physical Exam: Vitals:   02/25/23 2015 02/25/23 2020 02/25/23 2030 02/25/23 2045  BP:    (!) 150/116  Pulse:   92   Resp: 20   (!) 21  Temp:  99 F (37.2 C)    TempSrc:      SpO2:   93%     Physical Exam Vitals reviewed.  Constitutional:      General: He is not in acute distress. HENT:     Head: Normocephalic and atraumatic.  Eyes:     Extraocular Movements: Extraocular movements intact.  Cardiovascular:     Rate and Rhythm: Normal rate and regular rhythm.     Pulses: Normal pulses.     Heart sounds: Murmur  heard.     Comments: Murmur appreciated in the aortic region Pulmonary:     Effort: No respiratory distress.     Breath sounds: Normal breath sounds. No wheezing or rales.  Abdominal:     General: Bowel sounds are normal. There is no distension.     Palpations: Abdomen is soft.     Tenderness: There is no abdominal tenderness.  Musculoskeletal:     Cervical back: Normal range of motion.     Right lower leg: No edema.     Left lower leg: No edema.  Skin:    General: Skin is warm and dry.  Neurological:     General: No focal deficit present.     Mental Status: He is alert and oriented to person, place, and time.     Labs on Admission: I have personally reviewed following labs and imaging studies  CBC: Recent Labs  Lab 02/25/23 1613  WBC 8.5  NEUTROABS 6.8  HGB 13.5  HCT 42.1  MCV 97.9  PLT 222   Basic Metabolic Panel: Recent Labs  Lab 02/25/23 1613  NA 137  K 4.4  CL 104  CO2 21*  GLUCOSE 148*  BUN 31*  CREATININE 2.23*  CALCIUM 8.5*   GFR: CrCl cannot be calculated (Unknown ideal weight.). Liver Function Tests: No results for input(s): "AST", "ALT", "ALKPHOS", "BILITOT", "PROT", "ALBUMIN" in the last 168 hours. No results for input(s): "LIPASE", "AMYLASE" in the last 168 hours. No results for input(s): "AMMONIA" in the last 168 hours. Coagulation Profile: Recent Labs  Lab 02/25/23 1613  INR 1.9*   Cardiac Enzymes: No results for input(s): "CKTOTAL", "CKMB", "CKMBINDEX", "TROPONINI" in the last 168 hours. BNP (last 3 results) Recent Labs    08/27/22 0958 09/08/22 1004  PROBNP 450 432   HbA1C: No results for input(s): "HGBA1C" in the last 72 hours. CBG: No results for input(s): "GLUCAP" in the last 168 hours. Lipid Profile: No results for input(s): "CHOL", "HDL", "LDLCALC", "TRIG", "CHOLHDL", "LDLDIRECT" in the last 72 hours. Thyroid Function Tests: No results for input(s): "TSH", "T4TOTAL", "FREET4", "T3FREE", "THYROIDAB" in the last 72  hours. Anemia Panel: No results for input(s): "VITAMINB12", "FOLATE", "FERRITIN", "TIBC", "IRON", "RETICCTPCT" in the last 72 hours. Urine analysis:    Component Value Date/Time   COLORURINE YELLOW 02/25/2023 1614   APPEARANCEUR CLEAR 02/25/2023 1614   LABSPEC 1.011 02/25/2023 1614   PHURINE 6.0 02/25/2023 1614   GLUCOSEU 150 (A) 02/25/2023 1614   HGBUR NEGATIVE 02/25/2023 1614   BILIRUBINUR NEGATIVE 02/25/2023 1614   KETONESUR NEGATIVE 02/25/2023 1614   PROTEINUR NEGATIVE 02/25/2023 1614   UROBILINOGEN 0.2 04/08/2015 2033   NITRITE NEGATIVE 02/25/2023 1614   LEUKOCYTESUR NEGATIVE 02/25/2023 1614    Radiological Exams on Admission: CT Chest Wo Contrast  Result Date: 02/25/2023 CLINICAL DATA:  History of pneumonia with persistent chest pain and shortness of breath EXAM: CT CHEST WITHOUT CONTRAST TECHNIQUE: Multidetector CT imaging of the chest was performed following the standard protocol without IV contrast. RADIATION DOSE REDUCTION: This exam was performed according to the departmental dose-optimization program which includes automated exposure control, adjustment of the mA and/or kV according to patient size and/or use of iterative reconstruction technique. COMPARISON:  Chest x-ray from earlier in the same day, CT from 06/02/2022. FINDINGS: Cardiovascular: Atherosclerotic calcifications are noted. Evaluation is somewhat limited due to lack of IV contrast. Some bulbous dilatation of the aortic arch is noted to 3.5 cm although normal distal tapering is seen. The heart is not significantly enlarged. Heavy coronary calcifications are noted. Pulmonary artery is not significantly enlarged. Mediastinum/Nodes: Thoracic inlet is within normal limits. No hilar or mediastinal adenopathy is noted. The esophagus as visualized is within normal limits. Lungs/Pleura: Diffuse emphysematous changes are identified. Scarring is noted in the bases bilaterally. Upper Abdomen: Visualized upper abdomen is  unremarkable. Musculoskeletal: No chest wall mass or suspicious bone lesions identified. IMPRESSION: No acute infiltrate is seen. No acute abnormality to correspond with the given clinical history is seen. Mild dilatation of the aortic arch to 3.5 cm. Aortic Atherosclerosis (ICD10-I70.0) and Emphysema (ICD10-J43.9). Electronically Signed   By: Alcide Clever M.D.   On: 02/25/2023 19:24   DG Chest Port 1 View  Result Date: 02/25/2023 CLINICAL DATA:  Chest pain and shortness of breath. Recent diagnosis of pneumonia. EXAM: PORTABLE CHEST 1 VIEW COMPARISON:  Chest radiograph dated 06/02/2022 FINDINGS: Normal lung volumes. No focal consolidations. Small bilateral pleural effusions. Enlarged cardiomediastinal silhouette is likely projectional. No acute osseous abnormality. IMPRESSION: 1. No focal consolidations. 2. Small bilateral pleural effusions. Electronically Signed   By: Agustin Cree M.D.   On: 02/25/2023 16:53    EKG: Independently reviewed.  Sinus rhythm, no significant change since prior tracing.  Assessment and Plan  Acute hypoxemic respiratory failure secondary to acute COPD exacerbation Patient presenting with complaints of dyspnea, cough, and wheezing.  Recently finished 1 week course of antibiotics.  No fever or leukocytosis.  CT without evidence of pneumonia.  SARS-CoV-2 PCR negative.  Patient was given Solu-Medrol 125 mg and bronchodilator treatments in the ED.  He is no longer wheezing.  Oxygen saturation initially 89% on room air with EMS, currently stable on 3 L [place].  Continue treatment with prednisone 40 mg daily starting in the morning, DuoNeb every 6 hours PRN, Mucinex.  Continue supplemental oxygen, wean as tolerated.  AKI on CKD stage IIIa Likely due to dehydration in the setting of intermittent diarrhea for several months and also takes diuretics and ARB at home.  BUN 31, creatinine 2.2 (baseline 1.2-1.4).  Continue gentle IV fluid hydration and monitor renal function.  Avoid nephrotoxic  agents/hold home Lasix, losartan,  and spironolactone.  History of DVT/PE He is on warfarin but INR subtherapeutic at 1.9.  Recurrent PE less likely as patient is not tachycardic and age-adjusted D-dimer within normal range.  Pharmacy consulted for warfarin dosing.  Chronic HFpEF Echo done in August 2023 showing EF 50 to 55%.  BNP 370.  He does not appear volume overloaded on exam and CT without evidence of pulmonary edema.  Holding Lasix, losartan, and spironolactone at this time given AKI.  Continue metoprolol after pharmacy med rec is done.  Monitor volume status closely.  Moderate to severe aortic valve stenosis Per prior echo done in August 2023.  This is also likely contributing to his dyspnea on exertion.  He is followed by cardiology and felt to be a poor candidate for TAVR due to PAD/issues with femoral access.  Cardiology considering possible SAVR but his numerous comorbidities may preclude that.  Cardiology planning on reviewing his case with structural heart team.  He will need outpatient follow-up with his cardiologist.  CAD status post PCI in August 2023 Patient is not endorsing any chest pain at this time.  Troponin negative x 2 and not consistent with ACS.  Hypertension Blood pressure elevated with systolic in the 150s.  Continue metoprolol after pharmacy med rec is done.  Hold Lasix, losartan, and spironolactone due to AKI.  IV hydralazine PRN.   PAD Hyperlipidemia Peripheral neuropathy Gout BPH GERD Continue home medications after pharmacy med rec is done.  DVT prophylaxis: Warfarin Code Status: Full Code (discussed with the patient) Family Communication: No family available at this time. Level of care: Telemetry bed Admission status: It is my clinical opinion that referral for OBSERVATION is reasonable and necessary in this patient based on the above information provided. The aforementioned taken together are felt to place the patient at high risk for further clinical  deterioration. However, it is anticipated that the patient may be medically stable for discharge from the hospital within 24 to 48 hours.   John Giovanni MD [facility]  If 7PM-7AM, please contact night-coverage [email or web address]  02/25/2023, 8:56 PM

## 2023-02-25 NOTE — ED Notes (Signed)
ED TO INPATIENT HANDOFF REPORT  ED Nurse Name and Phone #: Britt Boozer [phone]  S Name/Age/Gender Tony Zhang 77 y.o. male Room/Bed: 024C/024C  Code Status   Code Status: Prior  Home/SNF/Other Home Patient oriented to: self, place, time, and situation Is this baseline? Yes   Triage Complete: Triage complete  Chief Complaint AKI (acute kidney injury) (HCC) [N17.9]  Triage Note Pt to ED via EMS from home c/o CP, Sob that has been worsening. Pt stated that he was dx with pneumonia recently and completed his antibiotic treatment. EMS states pt was 89% RA they placed him on 2L [place]. Pt Aox4 in triage, RA O2 sat 98%. Reports he had 2 cardiac stents placed 2 months ago   Allergies Allergies  Allergen Reactions   Codeine Hives and Other (See Comments)    Takes benadryl to stop allergic reactions    Level of Care/Admitting Diagnosis ED Disposition     ED Disposition  Admit   Condition  --   Comment  Hospital Area: [facility] [[id number]]  Level of Care: Telemetry Medical [104]  May place patient in observation at [facility] or [facility] if equivalent level of care is available:: Yes  Covid Evaluation: Asymptomatic - no recent exposure (last 10 days) testing not required  Diagnosis: AKI (acute kidney injury) [facility]) [[id number]]  Admitting Physician: John Giovanni [[id number]]  Attending Physician: John Giovanni [[id number]]          B Medical/Surgery History Past Medical History:  Diagnosis Date   AAA (abdominal aortic aneurysm) (HCC)    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     COPD (chronic obstructive pulmonary disease) (HCC)    DJD (degenerative joint disease)    Dyslipidemia    Gout    Malignant hypertension    Peripheral vascular disease (HCC)    Pneumonia 08/2013   pt. reports that he is having this surgery 11/25/2014- for the lung problem that began with pneumonia in 09/2014    Shortness of breath    Venous thrombosis    Recurrent   Past Surgical History:  Procedure Laterality Date   APPENDECTOMY     BACK SURGERY     COLONOSCOPY N/A 04/13/2015   Procedure: COLONOSCOPY;  Surgeon: Carman Ching, MD;  Location: [facility] ENDOSCOPY;  Service: Endoscopy;  Laterality: N/A;   CORONARY STENT INTERVENTION N/A 06/04/2022   Procedure: CORONARY STENT INTERVENTION;  Surgeon: Corky Crafts, MD;  Location: [facility] INVASIVE CV LAB;  Service: Cardiovascular;  Laterality: N/A;   EMPYEMA DRAINAGE N/A 11/25/2014   Procedure: EMPYEMA DRAINAGE;  Surgeon: Delight Ovens, MD;  Location: [facility] OR;  Service: Thoracic;  Laterality: N/A;   ESOPHAGOGASTRODUODENOSCOPY N/A 04/10/2015   Procedure: ESOPHAGOGASTRODUODENOSCOPY (EGD);  Surgeon: Charlott Rakes, MD;  Location: [facility] ENDOSCOPY;  Service: Endoscopy;  Laterality: N/A;   EYE SURGERY     both eyes, cataracts removed, denies lens implants   FLEXIBLE SIGMOIDOSCOPY N/A 04/12/2015   Procedure: FLEXIBLE SIGMOIDOSCOPY;  Surgeon: Carman Ching, MD;  Location: [facility] ENDOSCOPY;  Service: Endoscopy;  Laterality: N/A;   HERNIA REPAIR     LEFT HEART CATH AND CORONARY ANGIOGRAPHY N/A 06/04/2022   Procedure: LEFT HEART CATH AND CORONARY ANGIOGRAPHY;  Surgeon: Corky Crafts, MD;  Location: [facility] INVASIVE CV LAB;  Service: Cardiovascular;  Laterality: N/A;   SHOULDER SURGERY Right    VIDEO ASSISTED THORACOSCOPY Right 11/25/2014   Procedure: VIDEO ASSISTED THORACOSCOPY;  Surgeon:  Delight Ovens, MD;  Location: [facility] OR;  Service: Thoracic;  Laterality: Right;   VIDEO BRONCHOSCOPY N/A 11/25/2014   Procedure: VIDEO BRONCHOSCOPY;  Surgeon: Delight Ovens, MD;  Location: [facility] OR;  Service: Thoracic;  Laterality: N/A;     A IV Location/Drains/Wounds Patient Lines/Drains/Airways Status     Active Line/Drains/Airways     Name Placement date Placement time Site Days   Peripheral IV 02/25/23 Right Antecubital 02/25/23  --  Antecubital  less than 1   Pressure Injury  08/06/19 Sacrum Mid Stage I -  Intact skin with non-blanchable redness of a localized area usually over a bony prominence. redness on sacrum 08/06/19  0844  -- 1299            Intake/Output Last 24 hours No intake or output data in the 24 hours ending 02/25/23 2128  Labs/Imaging Results for orders placed or performed during the hospital encounter of 02/25/23 (from the past 48 hour(s))  Basic metabolic panel     Status: Abnormal   Collection Time: 02/25/23  4:13 PM  Result Value Ref Range   Sodium 137 135 - 145 mmol/L   Potassium 4.4 3.5 - 5.1 mmol/L   Chloride 104 98 - 111 mmol/L   CO2 21 (L) 22 - 32 mmol/L   Glucose, Bld 148 (H) 70 - 99 mg/dL    Comment: Glucose reference range applies only to samples taken after fasting for at least 8 hours.   BUN 31 (H) 8 - 23 mg/dL   Creatinine, Ser 5.78 (H) 0.61 - 1.24 mg/dL   Calcium 8.5 (L) 8.9 - 10.3 mg/dL   GFR, Estimated 30 (L) >60 mL/min    Comment: (NOTE) Calculated using the CKD-EPI Creatinine Equation (2021)    Anion gap 12 5 - 15    Comment: Performed at [facility] Lab, [address] [place], [place] [address]  CBC with Differential     Status: Abnormal   Collection Time: 02/25/23  4:13 PM  Result Value Ref Range   WBC 8.5 4.0 - 10.5 K/uL   RBC 4.30 4.22 - 5.81 MIL/uL   Hemoglobin 13.5 13.0 - 17.0 g/dL   HCT 95.2 84.1 - 32.4 %   MCV 97.9 80.0 - 100.0 fL   MCH 31.4 26.0 - 34.0 pg   MCHC 32.1 30.0 - 36.0 g/dL   RDW 40.1 (H) 02.7 - 25.3 %   Platelets 222 150 - 400 K/uL   nRBC 0.0 0.0 - 0.2 %   Neutrophils Relative % 80 %   Neutro Abs 6.8 1.7 - 7.7 K/uL   Lymphocytes Relative 6 %   Lymphs Abs 0.5 (L) 0.7 - 4.0 K/uL   Monocytes Relative 11 %   Monocytes Absolute 1.0 0.1 - 1.0 K/uL   Eosinophils Relative 2 %   Eosinophils Absolute 0.2 0.0 - 0.5 K/uL   Basophils Relative 0 %   Basophils Absolute 0.0 0.0 - 0.1 K/uL   Immature Granulocytes 1 %   Abs Immature Granulocytes 0.09 (H) 0.00 - 0.07 K/uL    Comment:  Performed at [facility] Lab, [address] [place], [place] [address]  Brain natriuretic peptide     Status: Abnormal   Collection Time: 02/25/23  4:13 PM  Result Value Ref Range   B Natriuretic Peptide 370.4 (H) 0.0 - 100.0 pg/mL    Comment: Performed at [facility] Lab, [address], [place], [place] [address]  Troponin I (High Sensitivity)  Status: None   Collection Time: 02/25/23  4:13 PM  Result Value Ref Range   Troponin I (High Sensitivity) 10 <18 ng/L    Comment: (NOTE) Elevated high sensitivity troponin I (hsTnI) values and significant  changes across serial measurements may suggest ACS but many other  chronic and acute conditions are known to elevate hsTnI results.  Refer to the "Links" section for chest pain algorithms and additional  guidance. Performed at [facility], [address] [place] [address], [place], [place] [address]   Protime-INR     Status: Abnormal   Collection Time: 02/25/23  4:13 PM  Result Value Ref Range   Prothrombin Time 22.1 (H) 11.4 - 15.2 seconds   INR 1.9 (H) 0.8 - 1.2    Comment: (NOTE) INR goal varies based on device and disease states. Performed at [facility] Lab, [address], [place], [place] [address]   Urinalysis, Routine w reflex microscopic -Urine, Clean Catch     Status: Abnormal   Collection Time: 02/25/23  4:14 PM  Result Value Ref Range   Color, Urine YELLOW YELLOW   APPearance CLEAR CLEAR   Specific Gravity, Urine 1.011 1.005 - 1.030   pH 6.0 5.0 - 8.0   Glucose, UA 150 (A) NEGATIVE mg/dL   Hgb urine dipstick NEGATIVE NEGATIVE   Bilirubin Urine NEGATIVE NEGATIVE   Ketones, ur NEGATIVE NEGATIVE mg/dL   Protein, ur NEGATIVE NEGATIVE mg/dL   Nitrite NEGATIVE NEGATIVE   Leukocytes,Ua NEGATIVE NEGATIVE    Comment: Performed at [facility], [address], [place], [place] [address]  SARS Coronavirus 2 by RT PCR (hospital order, performed in [facility] hospital lab) *cepheid single result test* Anterior Nasal  Swab     Status: None   Collection Time: 02/25/23  4:15 PM   Specimen: Anterior Nasal Swab  Result Value Ref Range   SARS Coronavirus 2 by RT PCR NEGATIVE NEGATIVE    Comment: Performed at [facility], [address] [place], [place] [address]  Lactic acid, plasma     Status: None   Collection Time: 02/25/23  4:19 PM  Result Value Ref Range   Lactic Acid, Venous 1.7 0.5 - 1.9 mmol/L    Comment: Performed at [facility] Lab, [address] [place], [place] [address]  Troponin I (High Sensitivity)     Status: None   Collection Time: 02/25/23  6:14 PM  Result Value Ref Range   Troponin I (High Sensitivity) 11 <18 ng/L    Comment: (NOTE) Elevated high sensitivity troponin I (hsTnI) values and significant  changes across serial measurements may suggest ACS but many other  chronic and acute conditions are known to elevate hsTnI results.  Refer to the "Links" section for chest pain algorithms and additional  guidance. Performed at [facility] Lab, [address] [place], [place] [address]   D-dimer, quantitative     Status: Abnormal   Collection Time: 02/25/23  6:14 PM  Result Value Ref Range   D-Dimer, Quant 0.71 (H) 0.00 - 0.50 ug/mL-FEU    Comment: (NOTE) At the manufacturer cut-off value of 0.5 g/mL FEU, this assay has a negative predictive value of 95-100%.This assay is intended for use in conjunction with a clinical pretest probability (PTP) assessment model to exclude pulmonary embolism (PE) and deep venous thrombosis (DVT) in outpatients suspected of PE or DVT. Results should be correlated with clinical presentation. Performed at [facility] Lab, [address], [place], [place]  [address]    CT Chest Wo Contrast  Result Date: 02/25/2023 CLINICAL DATA:  History of pneumonia with persistent chest pain and shortness of breath EXAM: CT CHEST WITHOUT CONTRAST TECHNIQUE: Multidetector CT imaging of the chest was performed following the standard protocol without  IV contrast. RADIATION DOSE REDUCTION: This exam was performed according to the departmental dose-optimization program which includes automated exposure control, adjustment of the mA and/or kV according to patient size and/or use of iterative reconstruction technique. COMPARISON:  Chest x-ray from earlier in the same day, CT from 06/02/2022. FINDINGS: Cardiovascular: Atherosclerotic calcifications are noted. Evaluation is somewhat limited due to lack of IV contrast. Some bulbous dilatation of the aortic arch is noted to 3.5 cm although normal distal tapering is seen. The heart is not significantly enlarged. Heavy coronary calcifications are noted. Pulmonary artery is not significantly enlarged. Mediastinum/Nodes: Thoracic inlet is within normal limits. No hilar or mediastinal adenopathy is noted. The esophagus as visualized is within normal limits. Lungs/Pleura: Diffuse emphysematous changes are identified. Scarring is noted in the bases bilaterally. Upper Abdomen: Visualized upper abdomen is unremarkable. Musculoskeletal: No chest wall mass or suspicious bone lesions identified. IMPRESSION: No acute infiltrate is seen. No acute abnormality to correspond with the given clinical history is seen. Mild dilatation of the aortic arch to 3.5 cm. Aortic Atherosclerosis (ICD10-I70.0) and Emphysema (ICD10-J43.9). Electronically Signed   By: Alcide Clever M.D.   On: 02/25/2023 19:24   DG Chest Port 1 View  Result Date: 02/25/2023 CLINICAL DATA:  Chest pain and shortness of breath. Recent diagnosis of pneumonia. EXAM: PORTABLE CHEST 1 VIEW COMPARISON:  Chest radiograph dated 06/02/2022 FINDINGS: Normal lung volumes. No focal consolidations. Small bilateral pleural effusions. Enlarged cardiomediastinal silhouette is likely projectional. No acute osseous abnormality. IMPRESSION: 1. No focal consolidations. 2. Small bilateral pleural effusions. Electronically Signed   By: Agustin Cree M.D.   On: 02/25/2023 16:53    Pending  Labs Unresulted Labs (From admission, onward)     Start     Ordered   02/25/23 1619  Culture, blood (routine x 2)  BLOOD CULTURE X 2,   R      02/25/23 1618            Vitals/Pain Today's Vitals   02/25/23 2020 02/25/23 2030 02/25/23 2045 02/25/23 2115  BP:   (!) 150/116   Pulse:  92  82  Resp:   (!) 21 (!) 21  Temp: 99 F (37.2 C)     TempSrc:      SpO2:  93%  97%    Isolation Precautions Airborne and Contact precautions  Medications Medications  methylPREDNISolone sodium succinate (SOLU-MEDROL) 125 mg/2 mL injection 125 mg (125 mg Intravenous Given 02/25/23 1809)  ipratropium-albuterol (DUONEB) 0.5-2.5 (3) MG/3ML nebulizer solution 3 mL (3 mLs Nebulization Given 02/25/23 1810)  sodium chloride 0.9 % bolus 500 mL (500 mLs Intravenous New Bag/Given 02/25/23 1809)  albuterol (PROVENTIL) (2.5 MG/3ML) 0.083% nebulizer solution 2.5 mg (2.5 mg Nebulization Given 02/25/23 2041)    Mobility walks with person assist       R Recommendations: See Admitting Provider Note  Report given to:   Additional Notes: from home SOB, CP, recently dx with PNA, completed ABX, 89% on RA, ambulation trial after workup on RA dropped to 87%, needing 3L oxygen, baseline RA usually

## 2023-02-25 NOTE — ED Triage Notes (Signed)
Pt to ED via EMS from home c/o CP, Sob that has been worsening. Pt stated that he was dx with pneumonia recently and completed his antibiotic treatment. EMS states pt was 89% RA they placed him on 2L [place]. Pt Aox4 in triage, RA O2 sat 98%. Reports he had 2 cardiac stents placed 2 months ago

## 2023-02-25 NOTE — ED Provider Notes (Signed)
[facility] AT [facility] Provider Note   CSN: [id number] Arrival date & time: 02/25/23  1554     History  Chief Complaint  Patient presents with   Shortness of Breath   Chest Pain    Tony Zhang is a 77 y.o. male.  Pt is a 77 y.o. 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  Pt said he's been sob for the past few weeks.  He was diagnosed with PNA and finished his abx, but does not feel any better.  Pt was 89% on RA when he arrived here today.  He was put on 2L and sats up to 98%.                  Home Medications Prior to Admission medications   Medication Sig Start Date End Date Taking? Authorizing Provider  albuterol (VENTOLIN HFA) 108 (90 Base) MCG/ACT inhaler Inhale 2 puffs into the lungs 5 (five) times daily.    [provider]  allopurinol (ZYLOPRIM) 300 MG tablet Take 300 mg by mouth at bedtime.    [provider]  atorvastatin (LIPITOR) 40 MG tablet Take 40 mg by mouth daily. 07/08/22   [provider]  Choline Fenofibrate (FENOFIBRIC ACID) 135 MG CPDR Take 135 mg by mouth daily. 09/28/22 01/31/23  Ronney Asters, NP  cilostazol (PLETAL) 100 MG tablet Take 1 tablet by mouth daily.    [provider]  clopidogrel (PLAVIX) 75 MG tablet Take 1 tablet (75 mg total) by mouth daily. 12/07/22   Croitoru, Mihai, MD  diazepam (VALIUM) 2 MG tablet Take 2 mg by mouth in the morning and at  bedtime.    [provider]  empagliflozin (JARDIANCE) 10 MG TABS tablet Take 1 tablet (10 mg total) by mouth daily. 01/31/23   Croitoru, Mihai, MD  Evolocumab (REPATHA SURECLICK) 140 MG/ML SOAJ Inject 140 mg into the skin every 14 (fourteen) days. 12/08/22   Croitoru, Mihai, MD  furosemide (LASIX) 20 MG tablet Take 1 tablet (20 mg total) by mouth daily. Start on Tuesday 8/22 09/28/22   Ronney Asters, NP  gabapentin (NEURONTIN) 100 MG capsule Take 1 capsule (100 mg total) by mouth 2 (two) times daily. 01/31/23   Croitoru, Mihai, MD  losartan (COZAAR) 100 MG tablet TAKE 1 TABLET BY MOUTH DAILY 10/26/22   Ronney Asters, NP  metoprolol succinate (TOPROL-XL) 100 MG 24 hr tablet Take 100 mg by mouth daily.     [provider]  nitroGLYCERIN (NITROSTAT) 0.3 MG SL tablet Place under tongue. May take every 5 minutes up to 3 doses for chest pain. If still having pain after 3 doses, call 911. 01/31/23   Croitoru, Mihai, MD  omeprazole (PRILOSEC) 40 MG capsule Take 40 mg by mouth at bedtime.    [provider]  ondansetron (ZOFRAN) 8 MG tablet Take 8 mg by mouth 2 (two) times daily as needed for vomiting or nausea. Patient not taking: Reported on 01/31/2023 05/25/22   [provider]  spironolactone (ALDACTONE) 25  MG tablet Take 0.5 tablets (12.5 mg total) by mouth daily. 06/18/22   [name] M, PA-C  tamsulosin (FLOMAX) 0.4 MG CAPS capsule Take 0.4 mg by mouth every evening.    [provider]  warfarin (COUMADIN) 2 MG tablet Take 2 mg by mouth daily. Patient takes 2 mg Monday and Tuesday, Wed Thurs ans Sunday patient takes 4 mg and Friday and Sat patient takes 2 mg    [provider]      Allergies    Codeine    Review of Systems   Review of Systems  Respiratory:  Positive for shortness of breath.   All other systems reviewed and are negative.   Physical Exam Updated Vital Signs BP (!) 157/70   Pulse 86   Temp 99.1 F (37.3 C) (Oral)    Resp 19   SpO2 94%  Physical Exam Vitals and nursing note reviewed.  Constitutional:      Appearance: He is well-developed. He is obese.  HENT:     Head: Normocephalic and atraumatic.     Mouth/Throat:     Mouth: Mucous membranes are moist.     Pharynx: Oropharynx is clear.  Eyes:     Extraocular Movements: Extraocular movements intact.     Pupils: Pupils are equal, round, and reactive to light.  Cardiovascular:     Rate and Rhythm: Normal rate and regular rhythm.  Pulmonary:     Breath sounds: Rhonchi present.  Abdominal:     General: Bowel sounds are normal.     Palpations: Abdomen is soft.  Musculoskeletal:        General: Normal range of motion.     Cervical back: Normal range of motion and neck supple.     Right lower leg: Edema present.     Left lower leg: Edema present.  Skin:    General: Skin is warm.     Capillary Refill: Capillary refill takes less than 2 seconds.  Neurological:     General: No focal deficit present.     Mental Status: He is alert and oriented to person, place, and time.  Psychiatric:        Mood and Affect: Mood normal.        Behavior: Behavior normal.     ED Results / Procedures / Treatments   Labs (all labs ordered are listed, but only abnormal results are displayed) Labs Reviewed  BASIC METABOLIC PANEL - Abnormal; Notable for the following components:      Result Value   CO2 21 (*)    Glucose, Bld 148 (*)    BUN 31 (*)    Creatinine, Ser 2.23 (*)    Calcium 8.5 (*)    GFR, Estimated 30 (*)    All other components within normal limits  CBC WITH DIFFERENTIAL/PLATELET - Abnormal; Notable for the following components:   RDW 16.2 (*)    Lymphs Abs 0.5 (*)    Abs Immature Granulocytes 0.09 (*)    All other components within normal limits  BRAIN NATRIURETIC PEPTIDE - Abnormal; Notable for the following components:   B Natriuretic Peptide 370.4 (*)    All other components within normal limits  URINALYSIS, ROUTINE W REFLEX MICROSCOPIC  - Abnormal; Notable for the following components:   Glucose, UA 150 (*)    All other components within normal limits  PROTIME-INR - Abnormal; Notable for the following components:   Prothrombin Time 22.1 (*)    INR 1.9 (*)    All other  components within normal limits  D-DIMER, QUANTITATIVE - Abnormal; Notable for the following components:   D-Dimer, Quant 0.71 (*)    All other components within normal limits  SARS CORONAVIRUS 2 BY RT PCR  CULTURE, BLOOD (ROUTINE X 2)  CULTURE, BLOOD (ROUTINE X 2)  LACTIC ACID, PLASMA  TROPONIN I (HIGH SENSITIVITY)  TROPONIN I (HIGH SENSITIVITY)    EKG EKG Interpretation  Date/Time:  Friday Feb 25 2023 16:13:28 EDT Ventricular Rate:  91 PR Interval:  178 QRS Duration: 87 QT Interval:  354 QTC Calculation: 436 R Axis:   -73 Text Interpretation: Sinus rhythm Inferior infarct, old Anterior infarct, old Baseline wander in lead(s) II No significant change since last tracing Confirmed by Jacalyn Lefevre [phone]) on 02/25/2023 4:19:49 PM  Radiology CT Chest Wo Contrast  Result Date: 02/25/2023 CLINICAL DATA:  History of pneumonia with persistent chest pain and shortness of breath EXAM: CT CHEST WITHOUT CONTRAST TECHNIQUE: Multidetector CT imaging of the chest was performed following the standard protocol without IV contrast. RADIATION DOSE REDUCTION: This exam was performed according to the departmental dose-optimization program which includes automated exposure control, adjustment of the mA and/or kV according to patient size and/or use of iterative reconstruction technique. COMPARISON:  Chest x-ray from earlier in the same day, CT from 06/02/2022. FINDINGS: Cardiovascular: Atherosclerotic calcifications are noted. Evaluation is somewhat limited due to lack of IV contrast. Some bulbous dilatation of the aortic arch is noted to 3.5 cm although normal distal tapering is seen. The heart is not significantly enlarged. Heavy coronary calcifications are noted.  Pulmonary artery is not significantly enlarged. Mediastinum/Nodes: Thoracic inlet is within normal limits. No hilar or mediastinal adenopathy is noted. The esophagus as visualized is within normal limits. Lungs/Pleura: Diffuse emphysematous changes are identified. Scarring is noted in the bases bilaterally. Upper Abdomen: Visualized upper abdomen is unremarkable. Musculoskeletal: No chest wall mass or suspicious bone lesions identified. IMPRESSION: No acute infiltrate is seen. No acute abnormality to correspond with the given clinical history is seen. Mild dilatation of the aortic arch to 3.5 cm. Aortic Atherosclerosis (ICD10-I70.0) and Emphysema (ICD10-J43.9). Electronically Signed   By: Alcide Clever M.D.   On: 02/25/2023 19:24   DG Chest Port 1 View  Result Date: 02/25/2023 CLINICAL DATA:  Chest pain and shortness of breath. Recent diagnosis of pneumonia. EXAM: PORTABLE CHEST 1 VIEW COMPARISON:  Chest radiograph dated 06/02/2022 FINDINGS: Normal lung volumes. No focal consolidations. Small bilateral pleural effusions. Enlarged cardiomediastinal silhouette is likely projectional. No acute osseous abnormality. IMPRESSION: 1. No focal consolidations. 2. Small bilateral pleural effusions. Electronically Signed   By: Agustin Cree M.D.   On: 02/25/2023 16:53    Procedures Procedures    Medications Ordered in ED Medications  albuterol (PROVENTIL) (2.5 MG/3ML) 0.083% nebulizer solution 2.5 mg (has no administration in time range)  methylPREDNISolone sodium succinate (SOLU-MEDROL) 125 mg/2 mL injection 125 mg (125 mg Intravenous Given 02/25/23 1809)  ipratropium-albuterol (DUONEB) 0.5-2.5 (3) MG/3ML nebulizer solution 3 mL (3 mLs Nebulization Given 02/25/23 1810)  sodium chloride 0.9 % bolus 500 mL (500 mLs Intravenous New Bag/Given 02/25/23 1809)    ED Course/ Medical Decision Making/ A&P                             Medical Decision Making Amount and/or Complexity of Data Reviewed Labs:  ordered. Radiology: ordered.  Risk Prescription drug management. Decision regarding hospitalization.   This patient presents to the ED for concern of  sob, this involves an extensive number of treatment options, and is a complaint that carries with it a high risk of complications and morbidity.  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  The pertinent results include:  cbc nl, bmp with bun 31 and cr 2.23 (bun 20 and cr 1.44 in December), inr 1.9, trop 10, ua + glucose, covid neg, ddimer nl (age adjusted)   Imaging Studies ordered:  I ordered imaging studies including cxr and ct chest  I independently visualized and interpreted imaging which showed  CXR:  No focal consolidations.  2. Small bilateral pleural effusions.  CT chest: No acute infiltrate is seen.    No acute abnormality to correspond with the given clinical history  is seen.    Mild dilatation of the aortic arch to 3.5 cm.    Aortic Atherosclerosis (ICD10-I70.0) and Emphysema (ICD10-J43.9).   I agree with the radiologist interpretation   Cardiac Monitoring:  The patient was maintained on a cardiac monitor.  I personally viewed and interpreted the cardiac  monitored which showed an underlying rhythm of: nsr   Medicines ordered and prescription drug management:  I ordered medication including solumedrol/nebs  for sob  Reevaluation of the patient after these medicines showed that the patient improved I have reviewed the patients home medicines and have made adjustments as needed   Test Considered:  ct   Critical Interventions:  ivfs   Consultations Obtained:  I requested consultation with the hospitalist (Dr. Loney Loh),  and discussed lab and imaging findings as well as pertinent plan - she will admit  Problem List / ED Course:  AKI:  no change in diuretics.  Small amount of NS given. Hypoxia:  likely from COPD.  Pt is still hypoxic with ambulation.  O2 sats drop to 86% with amb.  Pt on 2L and sats are in the mid-90s.  Age adjusted ddimer is neg.   Reevaluation:  After the interventions noted above, I reevaluated the patient and found that they have :improved   Social Determinants of Health:  Lives at home   Dispostion:  After consideration of the diagnostic results and the patients response to treatment, I feel that the patent would benefit from admission.    CRITICAL CARE Performed by: Jacalyn Lefevre   Total critical care time: 30 minutes  Critical care time was exclusive of separately billable procedures and treating other patients.  Critical care was necessary to treat or prevent imminent or life-threatening deterioration.  Critical care was time spent personally by me on the following activities: development of treatment plan with patient and/or surrogate as well as nursing, discussions with consultants, evaluation of patient's response to treatment, examination of patient, obtaining history from patient or surrogate, ordering and performing treatments and interventions, ordering and review of laboratory studies, ordering and review of radiographic studies, pulse oximetry and re-evaluation  of patient's condition.          Final Clinical Impression(s) / ED Diagnoses Final diagnoses:  AKI (acute kidney injury) (HCC)  Subtherapeutic international normalized ratio (INR)  Acute respiratory failure with hypoxia (HCC)  COPD exacerbation (HCC)    Rx / DC Orders ED Discharge Orders     None         Jacalyn Lefevre, MD 02/25/23 2011

## 2023-02-26 DIAGNOSIS — Z7984 Long term (current) use of oral hypoglycemic drugs: Secondary | ICD-10-CM | POA: Diagnosis not present

## 2023-02-26 DIAGNOSIS — I739 Peripheral vascular disease, unspecified: Secondary | ICD-10-CM | POA: Diagnosis present

## 2023-02-26 DIAGNOSIS — I252 Old myocardial infarction: Secondary | ICD-10-CM | POA: Diagnosis not present

## 2023-02-26 DIAGNOSIS — E860 Dehydration: Secondary | ICD-10-CM | POA: Diagnosis present

## 2023-02-26 DIAGNOSIS — J441 Chronic obstructive pulmonary disease with (acute) exacerbation: Secondary | ICD-10-CM | POA: Diagnosis present

## 2023-02-26 DIAGNOSIS — M109 Gout, unspecified: Secondary | ICD-10-CM | POA: Diagnosis present

## 2023-02-26 DIAGNOSIS — I2510 Atherosclerotic heart disease of native coronary artery without angina pectoris: Secondary | ICD-10-CM | POA: Diagnosis present

## 2023-02-26 DIAGNOSIS — I350 Nonrheumatic aortic (valve) stenosis: Secondary | ICD-10-CM

## 2023-02-26 DIAGNOSIS — R0609 Other forms of dyspnea: Secondary | ICD-10-CM | POA: Diagnosis not present

## 2023-02-26 DIAGNOSIS — E7800 Pure hypercholesterolemia, unspecified: Secondary | ICD-10-CM | POA: Diagnosis present

## 2023-02-26 DIAGNOSIS — I130 Hypertensive heart and chronic kidney disease with heart failure and stage 1 through stage 4 chronic kidney disease, or unspecified chronic kidney disease: Secondary | ICD-10-CM | POA: Diagnosis present

## 2023-02-26 DIAGNOSIS — D6859 Other primary thrombophilia: Secondary | ICD-10-CM | POA: Diagnosis present

## 2023-02-26 DIAGNOSIS — Z87891 Personal history of nicotine dependence: Secondary | ICD-10-CM | POA: Diagnosis not present

## 2023-02-26 DIAGNOSIS — Z79899 Other long term (current) drug therapy: Secondary | ICD-10-CM | POA: Diagnosis not present

## 2023-02-26 DIAGNOSIS — Z86711 Personal history of pulmonary embolism: Secondary | ICD-10-CM

## 2023-02-26 DIAGNOSIS — E538 Deficiency of other specified B group vitamins: Secondary | ICD-10-CM | POA: Diagnosis present

## 2023-02-26 DIAGNOSIS — N179 Acute kidney failure, unspecified: Secondary | ICD-10-CM

## 2023-02-26 DIAGNOSIS — K219 Gastro-esophageal reflux disease without esophagitis: Secondary | ICD-10-CM | POA: Diagnosis present

## 2023-02-26 DIAGNOSIS — Z86718 Personal history of other venous thrombosis and embolism: Secondary | ICD-10-CM | POA: Diagnosis not present

## 2023-02-26 DIAGNOSIS — N400 Enlarged prostate without lower urinary tract symptoms: Secondary | ICD-10-CM | POA: Diagnosis present

## 2023-02-26 DIAGNOSIS — I5032 Chronic diastolic (congestive) heart failure: Secondary | ICD-10-CM | POA: Diagnosis present

## 2023-02-26 DIAGNOSIS — N1831 Chronic kidney disease, stage 3a: Secondary | ICD-10-CM | POA: Diagnosis present

## 2023-02-26 DIAGNOSIS — Z8249 Family history of ischemic heart disease and other diseases of the circulatory system: Secondary | ICD-10-CM | POA: Diagnosis not present

## 2023-02-26 DIAGNOSIS — Z1152 Encounter for screening for COVID-19: Secondary | ICD-10-CM | POA: Diagnosis not present

## 2023-02-26 DIAGNOSIS — J9601 Acute respiratory failure with hypoxia: Secondary | ICD-10-CM | POA: Diagnosis present

## 2023-02-26 LAB — BASIC METABOLIC PANEL
Anion gap: 10 (ref 5–15)
BUN: 28 mg/dL — ABNORMAL HIGH (ref 8–23)
CO2: 21 mmol/L — ABNORMAL LOW (ref 22–32)
Calcium: 8.6 mg/dL — ABNORMAL LOW (ref 8.9–10.3)
Chloride: 106 mmol/L (ref 98–111)
Creatinine, Ser: 1.98 mg/dL — ABNORMAL HIGH (ref 0.61–1.24)
GFR, Estimated: 34 mL/min — ABNORMAL LOW (ref >60)
Glucose, Bld: 167 mg/dL — ABNORMAL HIGH (ref 70–99)
Potassium: 3.9 mmol/L (ref 3.5–5.1)
Sodium: 137 mmol/L (ref 135–145)

## 2023-02-26 LAB — VITAMIN B12: Vitamin B-12: <50 pg/mL — ABNORMAL LOW (ref 180–914)

## 2023-02-26 LAB — PROTIME-INR
INR: 1.7 — ABNORMAL HIGH (ref 0.8–1.2)
Prothrombin Time: 20.4 seconds — ABNORMAL HIGH (ref 11.4–15.2)

## 2023-02-26 LAB — HEMOGLOBIN A1C
Hgb A1c MFr Bld: 5.5 % (ref 4.8–5.6)
Mean Plasma Glucose: 111.15 mg/dL

## 2023-02-26 MED ORDER — ALLOPURINOL 100 MG PO TABS
100.0000 mg | ORAL_TABLET | Freq: Every day | ORAL | Status: DC
  Administered 2023-02-26 – 2023-02-27 (×2): 100 mg via ORAL
  Filled 2023-02-26 (×2): qty 1

## 2023-02-26 MED ORDER — PANTOPRAZOLE SODIUM 40 MG PO TBEC
40.0000 mg | DELAYED_RELEASE_TABLET | Freq: Every day | ORAL | Status: DC
  Administered 2023-02-26 – 2023-02-28 (×3): 40 mg via ORAL
  Filled 2023-02-26 (×3): qty 1

## 2023-02-26 MED ORDER — WARFARIN SODIUM 4 MG PO TABS
4.0000 mg | ORAL_TABLET | Freq: Once | ORAL | Status: AC
  Administered 2023-02-26: 4 mg via ORAL
  Filled 2023-02-26: qty 1

## 2023-02-26 MED ORDER — TAMSULOSIN HCL 0.4 MG PO CAPS
0.4000 mg | ORAL_CAPSULE | Freq: Every evening | ORAL | Status: DC
  Administered 2023-02-26 – 2023-02-27 (×2): 0.4 mg via ORAL
  Filled 2023-02-26 (×2): qty 1

## 2023-02-26 MED ORDER — MOMETASONE FURO-FORMOTEROL FUM 200-5 MCG/ACT IN AERO
2.0000 | INHALATION_SPRAY | Freq: Two times a day (BID) | RESPIRATORY_TRACT | Status: DC
  Administered 2023-02-26 – 2023-02-28 (×5): 2 via RESPIRATORY_TRACT
  Filled 2023-02-26: qty 8.8

## 2023-02-26 MED ORDER — METOPROLOL SUCCINATE ER 50 MG PO TB24
100.0000 mg | ORAL_TABLET | Freq: Every day | ORAL | Status: DC
  Administered 2023-02-26 – 2023-02-28 (×3): 100 mg via ORAL
  Filled 2023-02-26 (×3): qty 2

## 2023-02-26 MED ORDER — EMPAGLIFLOZIN 10 MG PO TABS
10.0000 mg | ORAL_TABLET | Freq: Every day | ORAL | Status: DC
  Administered 2023-02-26 – 2023-02-28 (×3): 10 mg via ORAL
  Filled 2023-02-26 (×3): qty 1

## 2023-02-26 MED ORDER — ATORVASTATIN CALCIUM 40 MG PO TABS
40.0000 mg | ORAL_TABLET | Freq: Every day | ORAL | Status: DC
  Administered 2023-02-26 – 2023-02-28 (×3): 40 mg via ORAL
  Filled 2023-02-26 (×3): qty 1

## 2023-02-26 MED ORDER — SODIUM CHLORIDE 0.9 % IV SOLN: INTRAVENOUS | Status: DC

## 2023-02-26 MED ORDER — VITAMIN B-6 25 MG PO TABS
50.0000 mg | ORAL_TABLET | Freq: Every day | ORAL | Status: DC
  Administered 2023-02-26 – 2023-02-28 (×3): 50 mg via ORAL
  Filled 2023-02-26 (×3): qty 2

## 2023-02-26 MED ORDER — PANTOPRAZOLE SODIUM 40 MG PO TBEC: 40.0000 mg | DELAYED_RELEASE_TABLET | Freq: Every day | ORAL | Status: DC

## 2023-02-26 MED ORDER — CILOSTAZOL 100 MG PO TABS
100.0000 mg | ORAL_TABLET | Freq: Every day | ORAL | Status: DC
  Administered 2023-02-26 – 2023-02-28 (×3): 100 mg via ORAL
  Filled 2023-02-26 (×3): qty 1

## 2023-02-26 MED ORDER — GABAPENTIN 300 MG PO CAPS
300.0000 mg | ORAL_CAPSULE | Freq: Every day | ORAL | Status: DC
  Administered 2023-02-26 – 2023-02-27 (×2): 300 mg via ORAL
  Filled 2023-02-26 (×2): qty 1

## 2023-02-26 MED ORDER — CLOPIDOGREL BISULFATE 75 MG PO TABS
75.0000 mg | ORAL_TABLET | Freq: Every day | ORAL | Status: DC
  Administered 2023-02-26 – 2023-02-28 (×3): 75 mg via ORAL
  Filled 2023-02-26 (×3): qty 1

## 2023-02-26 MED ORDER — UMECLIDINIUM BROMIDE 62.5 MCG/ACT IN AEPB
1.0000 | INHALATION_SPRAY | Freq: Every day | RESPIRATORY_TRACT | Status: DC
  Administered 2023-02-26 – 2023-02-28 (×3): 1 via RESPIRATORY_TRACT
  Filled 2023-02-26: qty 7

## 2023-02-26 NOTE — Progress Notes (Addendum)
ANTICOAGULATION CONSULT NOTE - Follow up Consult   Pharmacy Consult for warfarin Indication: Hx PE/DVT  Allergies  Allergen Reactions   Codeine Hives and Other (See Comments)    Takes benadryl to stop allergic reactions    Patient Measurements:    Vital Signs: Temp: 98 F (36.7 C) (05/11 0817) BP: 147/50 (05/11 0817) Pulse Rate: 85 (05/11 0817)  Labs: Recent Labs    02/25/23 1613 02/25/23 1814 02/26/23 0352  HGB 13.5  --   --   HCT 42.1  --   --   PLT 222  --   --   LABPROT 22.1*  --  20.4*  INR 1.9*  --  1.7*  CREATININE 2.23*  --  1.98*  TROPONINIHS 10 11  --      CrCl cannot be calculated (Unknown ideal weight.).   Medical History: Past Medical History:  Diagnosis Date   AAA (abdominal aortic aneurysm) (HCC)    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     COPD (chronic obstructive pulmonary disease) (HCC)    DJD (degenerative joint disease)    Dyslipidemia    Gout    Malignant hypertension    Peripheral vascular disease (HCC)    Pneumonia 08/2013   pt. reports that he is having this surgery 11/25/2014- for the lung problem that began with pneumonia in 09/2014   Shortness of breath    Venous thrombosis    Recurrent    Assessment: 76 YOM presenting with dyspnea, hx of VTE on warfarin PTA with INR on admission subtherapeutic 1.9, pt states last dose 5/9  PTA dosing: 2mg  daily except 4mg  on Sat/Sun  INR subtherapeutic at 1.7 this AM. Will repeat dose of 4mg . This aligns with home regimen.   Goal of Therapy:  INR 2-3 Monitor platelets by anticoagulation protocol: Yes   Plan:  Give Warfarin 4mg  PO x 1 tonight Daily INR, s/s bleeding  Blane Ohara, PharmD  PGY1 Pharmacy Resident

## 2023-02-26 NOTE — Progress Notes (Signed)
PROGRESS NOTE  Tony Zhang ZOX:[id number] DOB: 21-Aug-1946   PCP: [name], MD  Patient is from: Home.  Lives alone.  Independently ambulates at baseline.  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  Patient has no formal diagnosis of COPD or asthma.  Desaturated to 89% on RA and started on 2 L by nasal cannula.  D-dimer negative for age.  BNP elevated to 370.  Troponin negative x 2.  CTA chest negative for PE or pneumonia.  Blood cultures obtained.  Started on IV Solu-Medrol, nebulizers and IV fluid and admitted for further care.  Subjective: Seen and examined earlier this morning.  He feels "better".  He says his arthritis is better.  He also stated that his breathing is better.  He denies chest pain.  Continues to endorse productive cough.  He is not a great historian.  He denies history of asthma or COPD although he reports using albuterol for his breathing.  He says he never smoked.  Never seen a lung doctor.  Objective: Vitals:   02/26/23 0905 02/26/23 0910 02/26/23 0935 02/26/23 1200  BP:      Pulse:      Resp:      Temp:      TempSrc:      SpO2: 95% (!) 85% 98%   Weight:    92.3 kg    Examination:  GENERAL: No apparent distress.  Nontoxic. HEENT: MMM.  Vision and hearing grossly intact.  NECK: Supple.  No apparent JVD.  RESP:  No IWOB.  Fair aeration bilaterally. CVS:  RRR. Heart sounds normal.   ABD/GI/GU: BS+. Abd soft.  Mild LLQ tenderness. MSK/EXT:  Moves extremities. No apparent deformity. No edema.  SKIN: no apparent skin lesion or wound NEURO: Awake, alert and oriented appropriately.  No apparent focal neuro deficit. PSYCH: Calm. Normal affect.   Procedures:  None  Microbiology summarized: COVID-19, influenza and RSV PCR nonreactive.  Assessment and plan: Principal Problem:   COPD with acute exacerbation (HCC) Active Problems:   Acute respiratory failure with hypoxia (HCC)   Hyperlipidemia   AKI (acute kidney injury) (HCC)   History of pulmonary embolism   Aortic stenosis  Acute respiratory failure with hypoxia: Presents with complaints of dyspnea, cough and wheezing.  Multifactorial including possible COPD exacerbation, progression of CHF or AV stenosis.  Patient denies formal diagnosis of COPD or asthma.  Never smoked cigarettes.  Not on oxygen at home.  CT chest did not show pneumonia or PE.  BNP slightly elevated but appears euvolemic on exam.  Improved with COPD cocktail. -Continue COPD pathway with systemic steroid -Add LABA/LAMA/ICS -Continue DuoNeb as needed -Check echocardiogram  COPD exacerbation: Management as above.  Chronic HFpEF: TTE in 05/2022 with LVEF of 50 to 55%, G1DD, severe hypokinesis, and moderate to severe aortic valve stenosis.  BNP 370.  Does not appear fluid overloaded on exam.  Lungs clear. -Check echocardiogram since this has been planned by his cardiologist outpatient.  -Closely  monitor respiratory and fluid status while on IV fluid -Hold Lasix, Aldactone and losartan  Moderate to severe aortic valve stenosis: followed by cardiology and felt to be a poor candidate for TAVR due to PAD/issues with femoral access.  Cardiology considering possible SAVR but his numerous comorbidities may preclude that.  Cardiology planning on reviewing his case with structural heart team.   -Repeat echocardiogram.  May involve cardiology inpatient based on  echo.  AKI on CKD-3A: Cr 2.23 (baseline about 1.4).  Improving. Recent Labs    06/02/22 0752 06/02/22 1215 06/03/22 0257 06/04/22 0223 06/05/22 0254 06/06/22 0540 06/18/22 1035 09/28/22 1056 02/25/23 1613 02/26/23 0352  BUN 13 13 20  27* 28* 34* 15 20 31* 28*  CREATININE 0.91 0.96 1.22 1.28* 1.32* 1.67* 1.33* 1.44* 2.23* 1.98*  -Continue IV fluid -Avoid nephrotoxic meds-hold Lasix, losartan and Aldactone -Recheck in the morning   History of DVT/PE/subtherapeutic INR -Warfarin per pharmacy   CAD status post PCI in August 2023: No further chest pain.  Serial troponin negative. -Continue home meds-Plavix, Lipitor and metoprolol.   Hypertension: BP slightly elevated. -Resume home Toprol-XL.   PAD/HLD -Continue statin, Plavix and cilostazol  Gout -Continue home allopurinol at reduced dose  BPH -Continue home Flomax  GERD -P.o. Protonix.  Should not be on Prilosec while on Plavix  Peripheral neuropathy: Not diabetic. -Check vitamin B12 and hemoglobin A1c -Low-dose gabapentin and vitamin B6  Physical deconditioning -PT/OT   Body mass index is 26.85 kg/m.   DVT prophylaxis:   warfarin (COUMADIN) tablet 4 mg  Code Status: Full code Family Communication: None at bedside Level of care: Telemetry Medical Status is: Inpatient Remains inpatient appropriate because: Due to hypoxic respiratory failure, COPD exacerbation, AKI and physical deconditioning   Final disposition: Home Consultants:  None  55 minutes with more than 50% spent in reviewing records, counseling patient/family and coordinating care.   Sch Meds:  Scheduled Meds:  allopurinol  100 mg Oral QHS   atorvastatin  40 mg Oral Daily   cilostazol  100 mg Oral Daily   clopidogrel  75 mg Oral Daily   empagliflozin  10 mg Oral Daily   guaiFENesin  600 mg Oral BID   metoprolol succinate  100 mg Oral Daily   mometasone-formoterol  2 puff Inhalation BID   pantoprazole  40 mg Oral Daily    pantoprazole  40 mg Oral Daily   predniSONE  40 mg Oral Q breakfast   tamsulosin  0.4 mg Oral QPM   umeclidinium bromide  1 puff Inhalation Daily   warfarin  4 mg Oral ONCE-1600   Warfarin - Pharmacist Dosing Inpatient   Does not apply q1600   Continuous Infusions:  sodium chloride 75 mL/hr at 02/26/23 1310   PRN Meds:.acetaminophen **OR** acetaminophen, hydrALAZINE, ipratropium-albuterol  Antimicrobials: Anti-infectives (From admission, onward)    None        I have personally reviewed the following labs and images: CBC: Recent Labs  Lab 02/25/23 1613  WBC 8.5  NEUTROABS 6.8  HGB 13.5  HCT 42.1  MCV 97.9  PLT 222   BMP &GFR Recent Labs  Lab 02/25/23 1613 02/26/23 0352  NA 137 137  K 4.4 3.9  CL 104 106  CO2 21* 21*  GLUCOSE 148* 167*  BUN 31* 28*  CREATININE 2.23* 1.98*  CALCIUM 8.5* 8.6*   Estimated Creatinine Clearance: 35.3 mL/min (A) (by C-G formula based on SCr of 1.98 mg/dL (H)). Liver & Pancreas: No results for input(s): "AST", "ALT", "ALKPHOS", "BILITOT", "  PROT", "ALBUMIN" in the last 168 hours. No results for input(s): "LIPASE", "AMYLASE" in the last 168 hours. No results for input(s): "AMMONIA" in the last 168 hours. Diabetic: No results for input(s): "HGBA1C" in the last 72 hours. No results for input(s): "GLUCAP" in the last 168 hours. Cardiac Enzymes: No results for input(s): "CKTOTAL", "CKMB", "CKMBINDEX", "TROPONINI" in the last 168 hours. Recent Labs    08/27/22 0958 09/08/22 1004  PROBNP 450 432   Coagulation Profile: Recent Labs  Lab 02/25/23 1613 02/26/23 0352  INR 1.9* 1.7*   Thyroid Function Tests: No results for input(s): "TSH", "T4TOTAL", "FREET4", "T3FREE", "THYROIDAB" in the last 72 hours. Lipid Profile: No results for input(s): "CHOL", "HDL", "LDLCALC", "TRIG", "CHOLHDL", "LDLDIRECT" in the last 72 hours. Anemia Panel: No results for input(s): "VITAMINB12", "FOLATE", "FERRITIN", "TIBC", "IRON", "RETICCTPCT" in the  last 72 hours. Urine analysis:    Component Value Date/Time   COLORURINE YELLOW 02/25/2023 1614   APPEARANCEUR CLEAR 02/25/2023 1614   LABSPEC 1.011 02/25/2023 1614   PHURINE 6.0 02/25/2023 1614   GLUCOSEU 150 (A) 02/25/2023 1614   HGBUR NEGATIVE 02/25/2023 1614   BILIRUBINUR NEGATIVE 02/25/2023 1614   KETONESUR NEGATIVE 02/25/2023 1614   PROTEINUR NEGATIVE 02/25/2023 1614   UROBILINOGEN 0.2 04/08/2015 2033   NITRITE NEGATIVE 02/25/2023 1614   LEUKOCYTESUR NEGATIVE 02/25/2023 1614   Sepsis Labs: Invalid input(s): "PROCALCITONIN", "LACTICIDVEN"  Microbiology: Recent Results (from the past 240 hour(s))  SARS Coronavirus 2 by RT PCR (hospital order, performed in [facility] hospital lab) *cepheid single result test* Anterior Nasal Swab     Status: None   Collection Time: 02/25/23  4:15 PM   Specimen: Anterior Nasal Swab  Result Value Ref Range Status   SARS Coronavirus 2 by RT PCR NEGATIVE NEGATIVE Final    Comment: Performed at [facility], [address] [place], [place] [address]    Radiology Studies: CT Chest Wo Contrast  Result Date: 02/25/2023 CLINICAL DATA:  History of pneumonia with persistent chest pain and shortness of breath EXAM: CT CHEST WITHOUT CONTRAST TECHNIQUE: Multidetector CT imaging of the chest was performed following the standard protocol without IV contrast. RADIATION DOSE REDUCTION: This exam was performed according to the departmental dose-optimization program which includes automated exposure control, adjustment of the mA and/or kV according to patient size and/or use of iterative reconstruction technique. COMPARISON:  Chest x-ray from earlier in the same day, CT from 06/02/2022. FINDINGS: Cardiovascular: Atherosclerotic calcifications are noted. Evaluation is somewhat limited due to lack of IV contrast. Some bulbous dilatation of the aortic arch is noted to 3.5 cm although normal distal tapering is seen. The heart is not significantly enlarged. Heavy  coronary calcifications are noted. Pulmonary artery is not significantly enlarged. Mediastinum/Nodes: Thoracic inlet is within normal limits. No hilar or mediastinal adenopathy is noted. The esophagus as visualized is within normal limits. Lungs/Pleura: Diffuse emphysematous changes are identified. Scarring is noted in the bases bilaterally. Upper Abdomen: Visualized upper abdomen is unremarkable. Musculoskeletal: No chest wall mass or suspicious bone lesions identified. IMPRESSION: No acute infiltrate is seen. No acute abnormality to correspond with the given clinical history is seen. Mild dilatation of the aortic arch to 3.5 cm. Aortic Atherosclerosis (ICD10-I70.0) and Emphysema (ICD10-J43.9). Electronically Signed   By: Alcide Clever M.D.   On: 02/25/2023 19:24   DG Chest Port 1 View  Result Date: 02/25/2023 CLINICAL DATA:  Chest pain and shortness of breath. Recent diagnosis of pneumonia. EXAM: PORTABLE CHEST 1 VIEW COMPARISON:  Chest radiograph  dated 06/02/2022 FINDINGS: Normal lung volumes. No focal consolidations. Small bilateral pleural effusions. Enlarged cardiomediastinal silhouette is likely projectional. No acute osseous abnormality. IMPRESSION: 1. No focal consolidations. 2. Small bilateral pleural effusions. Electronically Signed   By: Agustin Cree M.D.   On: 02/25/2023 16:53      Trase Bunda T. Carlynn Leduc [facility]  If 7PM-7AM, please contact night-coverage [email or web address] 02/26/2023, 2:25 PM

## 2023-02-26 NOTE — Evaluation (Signed)
Physical Therapy Evaluation Patient Details Name: Tony Zhang MRN: [id number] DOB: 1946/03/29 Today's Date: 02/26/2023  History of Present Illness  The pt is a 77 yo male presenting 5/10 with SOB. PMH includes: CAD (NSTEMI with stents placed 8/23), HFrEF, PAD, DVT/PE, HTN, COPD, CKD, neuropathy, and gout. Pt also with recent bout of PNA and finished antibiotics but does not feel any better.   Clinical Impression  Pt in bed upon arrival of PT, agreeable to evaluation at this time. Prior to admission the pt was ambulating without DME, does report recent falls but is typically able to get himself up. He reports he was independent with ADLs and IADLs, still working at [employer] a few days a week. Pt reports most limited recently by poor endurance in addition to pain in bilateral shoulders due to arthritis. He was able to complete bed mobility today without assist, but required minG to complete OOB mobility for safety. The pt self-limited to ~35 ft ambulation with use of RW at this time, after ~15 ft SpO2 was 91%. We trialed 2L O2 with SpO2 improving to 95% but pt reports no difference with or without O2 for ambulation. Will continue to assess O2 needs with greater exertion, but recommend continued skilled PT to progress endurance and strength to allow for full return to independence.   SpO2 on RA at rest: 95% SpO2 on RA with 15 ft ambulation: 91% SpO2 on 2L after 35 ft ambulation: 95%      Recommendations for follow up therapy are one component of a multi-disciplinary discharge planning process, led by the attending physician.  Recommendations may be updated based on patient status, additional functional criteria and insurance authorization.  Follow Up Recommendations       Assistance Recommended at Discharge Intermittent Supervision/Assistance  Patient can return home with the following  A little help with walking and/or transfers;A little help with bathing/dressing/bathroom;Assistance with  cooking/housework;Assist for transportation;Help with stairs or ramp for entrance    Equipment Recommendations None recommended by PT  Recommendations for Other Services       Functional Status Assessment Patient has had a recent decline in their functional status and demonstrates the ability to make significant improvements in function in a reasonable and predictable amount of time.     Precautions / Restrictions Precautions Precautions: Fall Precaution Comments: watch SpO2 Restrictions Weight Bearing Restrictions: No      Mobility  Bed Mobility Overal bed mobility: Needs Assistance Bed Mobility: Supine to Sit     Supine to sit: Supervision     General bed mobility comments: no physical assist needed    Transfers Overall transfer level: Needs assistance Equipment used: None Transfers: Sit to/from Stand Sit to Stand: Min guard           General transfer comment: ming for safety, no overt LOB but pt then asking for UE support    Ambulation/Gait Ambulation/Gait assistance: Min guard Gait Distance (Feet): 35 Feet Assistive device: Rolling walker (2 wheels) Gait Pattern/deviations: Step-through pattern, Decreased stride length, Trunk flexed Gait velocity: decreased Gait velocity interpretation: <1.31 ft/sec, indicative of household ambulator   General Gait Details: pt with small strides, trunk flexed and heavy dependence on UE support. SpO2 to low of 91% on RA, improved to 95% on 2L with pt reporting no difference with or without O2     Balance Overall balance assessment: Needs assistance Sitting-balance support: No upper extremity supported, Feet supported Sitting balance-Leahy Scale: Good     Standing balance support:  Bilateral upper extremity supported, During functional activity Standing balance-Leahy Scale: Fair Standing balance comment: can static stand without UE support, prefers UE support for gait                             Pertinent  Vitals/Pain Pain Assessment Pain Assessment: Faces Faces Pain Scale: Hurts even more Pain Location: back Pain Descriptors / Indicators: Discomfort Pain Intervention(s): Limited activity within patient's tolerance, Monitored during session, Repositioned    Home Living Family/patient expects to be discharged to:: Private residence Living Arrangements: Alone Available Help at Discharge: Friend(s);Available PRN/intermittently Type of Home: Mobile home Home Access: Stairs to enter Entrance Stairs-Rails: Right;Left Entrance Stairs-Number of Steps: 3   Home Layout: One level Home Equipment: Agricultural consultant (2 wheels);Rollator (4 wheels);Cane - single point;Shower seat Additional Comments: pt reports his friend can come by as needed to assist, cannot provide physical assist    Prior Function Prior Level of Function : Independent/Modified Independent;Working/employed;Driving             Mobility Comments: pt repports furniture walking in his home, does have recent falls but is usually able to get himself up ADLs Comments: pt reports independent, typically does not need to use shower seat     Hand Dominance   Dominant Hand: Right    Extremity/Trunk Assessment   Upper Extremity Assessment Upper Extremity Assessment: Overall WFL for tasks assessed (reports chronic shoulder arthritis impacting his mobility)    Lower Extremity Assessment Lower Extremity Assessment: Generalized weakness (poor power to stand from EOB without UE support, but good strength to MMT, reports some baseline tingling in feet due to poor circulation)    Cervical / Trunk Assessment Cervical / Trunk Assessment: Kyphotic  Communication   Communication: No difficulties  Cognition Arousal/Alertness: Awake/alert Behavior During Therapy: WFL for tasks assessed/performed Overall Cognitive Status: Within Functional Limits for tasks assessed                                 General Comments: poor  insight to medical recommendations, but is oriented and able to follow simple commands, likely near baseline        General Comments General comments (skin integrity, edema, etc.): Spo2 95% on RA at rest, dropped to 91% on RA with gait, improved to 95% on 2L with gait, but pt reports no difference with or without O2.    Exercises     Assessment/Plan    PT Assessment Patient needs continued PT services  PT Problem List Cardiopulmonary status limiting activity;Decreased activity tolerance;Decreased mobility;Decreased balance       PT Treatment Interventions DME instruction;Gait training;Stair training;Functional mobility training;Therapeutic activities;Therapeutic exercise;Balance training    PT Goals (Current goals can be found in the Care Plan section)  Acute Rehab PT Goals Patient Stated Goal: return to independence PT Goal Formulation: With patient Time For Goal Achievement: 03/12/23 Potential to Achieve Goals: Good    Frequency Min 3X/week        AM-PAC PT "6 Clicks" Mobility  Outcome Measure Help needed turning from your back to your side while in a flat bed without using bedrails?: None Help needed moving from lying on your back to sitting on the side of a flat bed without using bedrails?: None Help needed moving to and from a bed to a chair (including a wheelchair)?: A Little Help needed standing up from a chair using your  arms (e.g., wheelchair or bedside chair)?: A Little Help needed to walk in hospital room?: A Little Help needed climbing 3-5 steps with a railing? : A Little 6 Click Score: 20    End of Session Equipment Utilized During Treatment: Gait belt;Oxygen Activity Tolerance: Patient tolerated treatment well Patient left: in chair;with call bell/phone within reach;with chair alarm set Nurse Communication: Mobility status PT Visit Diagnosis: Other abnormalities of gait and mobility (R26.89)    Time: 1108-1140 PT Time Calculation (min) (ACUTE ONLY): 32  min   Charges:   PT Evaluation $PT Eval Low Complexity: 1 Low PT Treatments $Gait Training: 8-22 mins        Vickki Muff, PT, DPT   Acute Rehabilitation Department Office [phone] Secure Chat Communication Preferred  Ronnie Derby 02/26/2023, 12:36 PM

## 2023-02-26 NOTE — Progress Notes (Signed)
Patient's SPO2 ambulatory on RA 85% SPO2 at rest on RA 95% SPO2 at rest with 2L O2 is 98%

## 2023-02-27 LAB — MAGNESIUM: Magnesium: 1.9 mg/dL (ref 1.7–2.4)

## 2023-02-27 LAB — CULTURE, BLOOD (ROUTINE X 2): Culture: NO GROWTH

## 2023-02-27 LAB — COMPREHENSIVE METABOLIC PANEL
ALT: 12 U/L (ref 0–44)
AST: 17 U/L (ref 15–41)
Albumin: 2.9 g/dL — ABNORMAL LOW (ref 3.5–5.0)
Alkaline Phosphatase: 38 U/L (ref 38–126)
Anion gap: 9 (ref 5–15)
BUN: 30 mg/dL — ABNORMAL HIGH (ref 8–23)
CO2: 19 mmol/L — ABNORMAL LOW (ref 22–32)
Calcium: 8.6 mg/dL — ABNORMAL LOW (ref 8.9–10.3)
Chloride: 107 mmol/L (ref 98–111)
Creatinine, Ser: 1.53 mg/dL — ABNORMAL HIGH (ref 0.61–1.24)
GFR, Estimated: 47 mL/min — ABNORMAL LOW (ref >60)
Glucose, Bld: 143 mg/dL — ABNORMAL HIGH (ref 70–99)
Potassium: 3.8 mmol/L (ref 3.5–5.1)
Sodium: 135 mmol/L (ref 135–145)
Total Bilirubin: 0.9 mg/dL (ref 0.3–1.2)
Total Protein: 6.6 g/dL (ref 6.5–8.1)

## 2023-02-27 LAB — CBC
HCT: 43.1 % (ref 39.0–52.0)
Hemoglobin: 14.2 g/dL (ref 13.0–17.0)
MCH: 31.6 pg (ref 26.0–34.0)
MCHC: 32.9 g/dL (ref 30.0–36.0)
MCV: 95.8 fL (ref 80.0–100.0)
Platelets: 284 10*3/uL (ref 150–400)
RBC: 4.5 MIL/uL (ref 4.22–5.81)
RDW: 15.9 % — ABNORMAL HIGH (ref 11.5–15.5)
WBC: 10.6 10*3/uL — ABNORMAL HIGH (ref 4.0–10.5)
nRBC: 0 % (ref 0.0–0.2)

## 2023-02-27 LAB — TSH: TSH: 0.531 u[IU]/mL (ref 0.350–4.500)

## 2023-02-27 LAB — PHOSPHORUS: Phosphorus: 2.8 mg/dL (ref 2.5–4.6)

## 2023-02-27 LAB — PROTIME-INR
INR: 2 — ABNORMAL HIGH (ref 0.8–1.2)
Prothrombin Time: 22.6 seconds — ABNORMAL HIGH (ref 11.4–15.2)

## 2023-02-27 LAB — CK: Total CK: 56 U/L (ref 49–397)

## 2023-02-27 MED ORDER — VITAMIN B-12 1000 MCG PO TABS: 1000.0000 ug | ORAL_TABLET | Freq: Every day | ORAL | Status: DC

## 2023-02-27 MED ORDER — WARFARIN SODIUM 4 MG PO TABS
4.0000 mg | ORAL_TABLET | Freq: Once | ORAL | Status: AC
  Administered 2023-02-27: 4 mg via ORAL
  Filled 2023-02-27: qty 1

## 2023-02-27 MED ORDER — CYCLOBENZAPRINE HCL 5 MG PO TABS
5.0000 mg | ORAL_TABLET | Freq: Three times a day (TID) | ORAL | Status: DC | PRN
  Administered 2023-02-27 (×2): 5 mg via ORAL
  Filled 2023-02-27 (×2): qty 1

## 2023-02-27 MED ORDER — CYANOCOBALAMIN 1000 MCG/ML IJ SOLN
2000.0000 ug | Freq: Every day | INTRAMUSCULAR | Status: DC
  Administered 2023-02-27 – 2023-02-28 (×2): 2000 ug via INTRAMUSCULAR
  Filled 2023-02-27 (×2): qty 2

## 2023-02-27 NOTE — Progress Notes (Signed)
Patient performed ADLs independently in room. Bathed self in front of sink. SpO2 94% on RA

## 2023-02-27 NOTE — Progress Notes (Signed)
PROGRESS NOTE  Tony Zhang JYN:[id number] DOB: 07/01/1946   PCP: [name], MD  Patient is from: Home.  Lives alone.  Independently ambulates at baseline.  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  Patient has no formal diagnosis of COPD or asthma.  Desaturated to 89% on RA and started on 2 L by nasal cannula.  D-dimer negative for age.  BNP elevated to 370.  Troponin negative x 2.  CTA chest negative for PE or pneumonia.  Blood cultures obtained.  Started on IV Solu-Medrol, nebulizers and IV fluid and admitted for further care.  Improving.  For echocardiogram.  Subjective: Seen and examined earlier this morning.  No major events overnight of this morning.  Reports significant improvement in his wheezing.  No chest pain.  Still with some cough but improved.  Objective: Vitals:   02/27/23 0449 02/27/23 0738 02/27/23 0937 02/27/23 0938  BP: (!) 154/54 (!) 143/55    Pulse: 70 83    Resp: 18 18    Temp: 98.3 F (36.8 C) 97.6 F (36.4 C)    TempSrc:      SpO2: 94% 96% 96% 95%  Weight:        Examination:  GENERAL: No apparent distress.  Nontoxic. HEENT: MMM.  Vision and hearing grossly intact.  NECK: Supple.  No apparent JVD.  RESP:  No IWOB.  Fair aeration bilaterally. CVS:  RRR. Heart sounds normal.  ABD/GI/GU: BS+. Abd soft, NTND.  MSK/EXT:   No apparent deformity. Moves extremities. No edema.  SKIN: no  apparent skin lesion or wound NEURO: Awake and alert. Oriented appropriately.  No apparent focal neuro deficit. PSYCH: Calm. Normal affect.   Procedures:  None  Microbiology summarized: COVID-19, influenza and RSV PCR nonreactive.  Assessment and plan: Principal Problem:   COPD with acute exacerbation (HCC) Active Problems:   Acute respiratory failure with hypoxia (HCC)   Hyperlipidemia   AKI (acute kidney injury) (HCC)   History of pulmonary embolism   Aortic stenosis  Acute respiratory failure with hypoxia: Presents with complaints of dyspnea, cough and wheezing.  Multifactorial including possible COPD exacerbation, progression of CHF or AV stenosis.  Patient denies formal diagnosis of COPD or asthma.  Never smoked cigarettes.  Not on oxygen at home.  CT chest did not show pneumonia or PE.  BNP slightly elevated but appears euvolemic on exam.  Improved with COPD meds. -Continue COPD pathway with systemic steroid, LAMA/LABA/ICS -Continue DuoNeb as needed -Follow echocardiogram.  COPD exacerbation: Management as above.  Chronic HFpEF: TTE in 05/2022 with LVEF of 50 to 55%, G1DD, severe hypokinesis, and moderate to severe aortic valve stenosis.  BNP 370.  Does not appear fluid overloaded on exam.  Lungs clear. -Check echocardiogram since this has been planned by his cardiologist outpatient.  -Closely monitor respiratory and fluid status while on IV fluid -Continue holding Lasix, Aldactone and losartan  Moderate to severe aortic valve stenosis: followed by cardiology and felt to be a poor  candidate for TAVR due to PAD/issues with femoral access.  Cardiology considering possible SAVR but his numerous comorbidities may preclude that.  Cardiology planning on reviewing his case with structural heart team.   -Repeat echocardiogram.  May involve cardiology inpatient based on echo.  AKI on CKD-3A: Cr 2.23 (baseline about 1.4).  Improving. Recent Labs    06/02/22 1215 06/03/22 0257  06/04/22 0223 06/05/22 0254 06/06/22 0540 06/18/22 1035 09/28/22 1056 02/25/23 1613 02/26/23 0352 02/27/23 0454  BUN 13 20 27* 28* 34* 15 20 31* 28* 30*  CREATININE 0.96 1.22 1.28* 1.32* 1.67* 1.33* 1.44* 2.23* 1.98* 1.53*  -Continue IV fluid -Avoid nephrotoxic meds-hold Lasix, losartan and Aldactone -Recheck in the morning   History of DVT/PE/subtherapeutic INR: INR therapeutic. -Warfarin per pharmacy   CAD status post PCI in August 2023: No further chest pain.  Serial troponin negative. -Continue home meds-Plavix, Lipitor and metoprolol.   Hypertension: BP slightly elevated. -Resume home Toprol-XL.   PAD/HLD -Continue statin, Plavix and cilostazol  Gout -Continue home allopurinol at reduced dose  BPH -Continue home Flomax  GERD -P.o. Protonix.  Should not be on Prilosec while on Plavix  Peripheral neuropathy/severe vitamin B12 deficiency: Vitamin B12 level <50.  A1c 5.5%. -Vitamin B12 injection 3000 mcg daily while in-house -Continue gabapentin 300 mg at night.  Physical deconditioning -PT/OT   Body mass index is 26.85 kg/m.   DVT prophylaxis:   warfarin (COUMADIN) tablet 4 mg  Code Status: Full code Family Communication: None at bedside Level of care: Med-Surg Status is: Inpatient Remains inpatient appropriate because: Due to hypoxic respiratory failure, COPD exacerbation, AKI and physical deconditioning   Final disposition: Home in the next 24 to 48 hours. Consultants:  None  35 minutes with more than 50% spent in reviewing records, counseling patient/family and coordinating care.   Sch Meds:  Scheduled Meds:  allopurinol  100 mg Oral QHS   atorvastatin  40 mg Oral Daily   cilostazol  100 mg Oral Daily   clopidogrel  75 mg Oral Daily   cyanocobalamin  2,000 mcg Intramuscular Daily   Followed by   Melene Muller ON 03/03/2023] vitamin B-12  1,000 mcg Oral Daily   empagliflozin  10 mg Oral Daily   gabapentin  300 mg Oral QHS   guaiFENesin  600 mg  Oral BID   metoprolol succinate  100 mg Oral Daily   mometasone-formoterol  2 puff Inhalation BID   pantoprazole  40 mg Oral Daily   predniSONE  40 mg Oral Q breakfast   pyridOXINE  50 mg Oral Daily   tamsulosin  0.4 mg Oral QPM   umeclidinium bromide  1 puff Inhalation Daily   warfarin  4 mg Oral ONCE-1600   Warfarin - Pharmacist Dosing Inpatient   Does not apply q1600   Continuous Infusions:  sodium chloride 75 mL/hr at 02/26/23 2232   PRN Meds:.acetaminophen **OR** acetaminophen, cyclobenzaprine, hydrALAZINE, ipratropium-albuterol  Antimicrobials: Anti-infectives (From admission, onward)    None        I have personally reviewed the following labs and images: CBC: Recent Labs  Lab 02/25/23 1613 02/27/23 0454  WBC 8.5 10.6*  NEUTROABS 6.8  --   HGB 13.5 14.2  HCT 42.1 43.1  MCV 97.9 95.8  PLT 222 284   BMP &GFR Recent Labs  Lab 02/25/23 1613 02/26/23 0352 02/27/23 0454  NA 137 137 135  K 4.4 3.9 3.8  CL 104 106 107  CO2 21* 21* 19*  GLUCOSE 148* 167* 143*  BUN  31* 28* 30*  CREATININE 2.23* 1.98* 1.53*  CALCIUM 8.5* 8.6* 8.6*  MG  --   --  1.9  PHOS  --   --  2.8   Estimated Creatinine Clearance: 45.7 mL/min (A) (by C-G formula based on SCr of 1.53 mg/dL (H)). Liver & Pancreas: Recent Labs  Lab 02/27/23 0454  AST 17  ALT 12  ALKPHOS 38  BILITOT 0.9  PROT 6.6  ALBUMIN 2.9*   No results for input(s): "LIPASE", "AMYLASE" in the last 168 hours. No results for input(s): "AMMONIA" in the last 168 hours. Diabetic: Recent Labs    02/26/23 1518  HGBA1C 5.5   No results for input(s): "GLUCAP" in the last 168 hours. Cardiac Enzymes: Recent Labs  Lab 02/27/23 0454  CKTOTAL 56   Recent Labs    08/27/22 0958 09/08/22 1004  PROBNP 450 432   Coagulation Profile: Recent Labs  Lab 02/25/23 1613 02/26/23 0352 02/27/23 0454  INR 1.9* 1.7* 2.0*   Thyroid Function Tests: Recent Labs    02/27/23 0454  TSH 0.531   Lipid Profile: No  results for input(s): "CHOL", "HDL", "LDLCALC", "TRIG", "CHOLHDL", "LDLDIRECT" in the last 72 hours. Anemia Panel: Recent Labs    02/26/23 1518  VITAMINB12 <50*   Urine analysis:    Component Value Date/Time   COLORURINE YELLOW 02/25/2023 1614   APPEARANCEUR CLEAR 02/25/2023 1614   LABSPEC 1.011 02/25/2023 1614   PHURINE 6.0 02/25/2023 1614   GLUCOSEU 150 (A) 02/25/2023 1614   HGBUR NEGATIVE 02/25/2023 1614   BILIRUBINUR NEGATIVE 02/25/2023 1614   KETONESUR NEGATIVE 02/25/2023 1614   PROTEINUR NEGATIVE 02/25/2023 1614   UROBILINOGEN 0.2 04/08/2015 2033   NITRITE NEGATIVE 02/25/2023 1614   LEUKOCYTESUR NEGATIVE 02/25/2023 1614   Sepsis Labs: Invalid input(s): "PROCALCITONIN", "LACTICIDVEN"  Microbiology: Recent Results (from the past 240 hour(s))  Culture, blood (routine x 2)     Status: None (Preliminary result)   Collection Time: 02/25/23  3:55 PM   Specimen: BLOOD  Result Value Ref Range Status   Specimen Description BLOOD SITE NOT SPECIFIED  Final   Special Requests   Final    BOTTLES DRAWN AEROBIC AND ANAEROBIC Blood Culture adequate volume   Culture   Final    NO GROWTH < 24 HOURS Performed at [facility], [address], [place], [place] [address]    Report Status PENDING  Incomplete  SARS Coronavirus 2 by RT PCR (hospital order, performed in [facility] hospital lab) *cepheid single result test* Anterior Nasal Swab     Status: None   Collection Time: 02/25/23  4:15 PM   Specimen: Anterior Nasal Swab  Result Value Ref Range Status   SARS Coronavirus 2 by RT PCR NEGATIVE NEGATIVE Final    Comment: Performed at [facility], [address] [place], [place] [address]  Culture, blood (routine x 2)     Status: None (Preliminary result)   Collection Time: 02/26/23  3:52 AM   Specimen: BLOOD  Result Value Ref Range Status   Specimen Description BLOOD BLOOD LEFT ARM  Final   Special Requests   Final    BOTTLES DRAWN AEROBIC AND ANAEROBIC Blood Culture  adequate volume   Culture   Final    NO GROWTH < 12 HOURS Performed at [facility], [address], [place], [place] [address]    Report Status PENDING  Incomplete    Radiology Studies: No results found.    Schae Cando T. [facility]  If 7PM-7AM, please contact night-coverage [email or web address] 02/27/2023, 1:19 PM

## 2023-02-27 NOTE — Evaluation (Signed)
Occupational Therapy Evaluation Patient Details Name: Tony Zhang MRN: [id number] DOB: [birth year] Today's Date: 02/27/2023   History of Present Illness The pt is a 77 yo male presenting 5/10 with SOB. PMH includes: CAD (NSTEMI with stents placed 8/23), HFrEF, PAD, DVT/PE, HTN, COPD, CKD, neuropathy, and gout. Pt also with recent bout of PNA and finished antibiotics but does not feel any better.   Clinical Impression   PTA patient independent with ADLs, mobility with AD. Admitted for above and presents with problem list below.  He demonstrates ability to complete ADLs, functional mobility and transfers with supervision today, using IV pole during mobility.  Pt declined out of room mobility due to fatigue, but in room able to mobilize with SpO2 maintaining > 95% on RA.  Educated on AAROM exercises to B shoulders due to chronic shoulder pain.  Pt reports feeling close to baseline.  Will follow acutely to optimize independence and safety, but anticipate no further needs after dc home.      Recommendations for follow up therapy are one component of a multi-disciplinary discharge planning process, led by the attending physician.  Recommendations may be updated based on patient status, additional functional criteria and insurance authorization.   Assistance Recommended at Discharge PRN  Patient can return home with the following A little help with walking and/or transfers;A little help with bathing/dressing/bathroom    Functional Status Assessment  Patient has had a recent decline in their functional status and demonstrates the ability to make significant improvements in function in a reasonable and predictable amount of time.  Equipment Recommendations  None recommended by OT    Recommendations for Other Services       Precautions / Restrictions Precautions Precautions: Fall Precaution Comments: watch SpO2 Restrictions Weight Bearing Restrictions: No      Mobility Bed Mobility                General bed mobility comments: EOB upon entry    Transfers Overall transfer level: Needs assistance Equipment used: None Transfers: Sit to/from Stand Sit to Stand: Supervision           General transfer comment: for safety, pt using IV pole once standing      Balance Overall balance assessment: Needs assistance Sitting-balance support: No upper extremity supported, Feet supported Sitting balance-Leahy Scale: Good     Standing balance support: No upper extremity supported, During functional activity, Single extremity supported Standing balance-Leahy Scale: Fair Standing balance comment: 0 to 1 UE support standing statically and for mobility                           ADL either performed or assessed with clinical judgement   ADL Overall ADL's : Needs assistance/impaired     Grooming: Supervision/safety;Standing           Upper Body Dressing : Set up;Sitting   Lower Body Dressing: Supervision/safety;Sit to/from stand   Toilet Transfer: Supervision/safety;Ambulation   Toileting- Clothing Manipulation and Hygiene: Supervision/safety;Sit to/from stand       Functional mobility during ADLs: Supervision/safety       Vision   Vision Assessment?: No apparent visual deficits     Perception     Praxis      Pertinent Vitals/Pain Pain Assessment Pain Assessment: No/denies pain Faces Pain Scale: Hurts a little bit Pain Location: B shoulders (R >L) Pain Descriptors / Indicators: Discomfort Pain Intervention(s): Limited activity within patient's tolerance, Monitored during session, Repositioned  Hand Dominance Right   Extremity/Trunk Assessment Upper Extremity Assessment Upper Extremity Assessment: Generalized weakness (chronic shoulder arthritis limiting shoulder flexion to ~60-75* (but when reaching pt able to raise UE upwards to 90*))   Lower Extremity Assessment Lower Extremity Assessment: Defer to PT evaluation   Cervical /  Trunk Assessment Cervical / Trunk Assessment: Kyphotic   Communication Communication Communication: No difficulties   Cognition Arousal/Alertness: Awake/alert Behavior During Therapy: WFL for tasks assessed/performed Overall Cognitive Status: Within Functional Limits for tasks assessed                                 General Comments: poor insight to medical recommendations, but is oriented and able to follow simple commands, likely near baseline     General Comments  SpO2 on RA 96-97% during mobility and ADLs; educated pt on completing lap slides for AAROM of shoulder to assist with ROM but avoiding pain    Exercises     Shoulder Instructions      Home Living Family/patient expects to be discharged to:: Private residence Living Arrangements: Alone Available Help at Discharge: Friend(s);Available PRN/intermittently Type of Home: Mobile home Home Access: Stairs to enter Entrance Stairs-Number of Steps: 3 Entrance Stairs-Rails: Right;Left Home Layout: One level     Bathroom Shower/Tub: Chief Strategy Officer: Standard     Home Equipment: Agricultural consultant (2 wheels);Rollator (4 wheels);Cane - single point;Shower seat   Additional Comments: pt reports his friend can come by as needed to assist, cannot provide physical assist      Prior Functioning/Environment Prior Level of Function : Independent/Modified Independent;Working/employed;Driving             Mobility Comments: pt repports furniture walking in his home, does have recent falls but is usually able to get himself up ADLs Comments: pt reports independent, typically does not need to use shower seat        OT Problem List: Decreased range of motion;Decreased activity tolerance;Decreased strength;Decreased knowledge of use of DME or AE;Impaired balance (sitting and/or standing);Pain      OT Treatment/Interventions: Self-care/ADL training;Therapeutic exercise;DME and/or AE  instruction;Energy conservation;Therapeutic activities;Patient/family education;Balance training    OT Goals(Current goals can be found in the care plan section) Acute Rehab OT Goals Patient Stated Goal: home OT Goal Formulation: With patient Time For Goal Achievement: 03/13/23 Potential to Achieve Goals: Good  OT Frequency: Min 2X/week    Co-evaluation              AM-PAC OT "6 Clicks" Daily Activity     Outcome Measure Help from another person eating meals?: None Help from another person taking care of personal grooming?: A Little Help from another person toileting, which includes using toliet, bedpan, or urinal?: A Little Help from another person bathing (including washing, rinsing, drying)?: A Little Help from another person to put on and taking off regular upper body clothing?: A Little Help from another person to put on and taking off regular lower body clothing?: A Little 6 Click Score: 19   End of Session Nurse Communication: Mobility status  Activity Tolerance: Patient tolerated treatment well Patient left: with call bell/phone within reach;with bed alarm set;Other (comment) (sitting EOB)  OT Visit Diagnosis: Other abnormalities of gait and mobility (R26.89);Muscle weakness (generalized) (M62.81);Pain Pain - Right/Left:  (bil) Pain - part of body: Shoulder                Time: 1610-9604  OT Time Calculation (min): 22 min Charges:  OT General Charges $OT Visit: 1 Visit OT Evaluation $OT Eval Low Complexity: 1 Low  Barry Brunner, OT Acute Rehabilitation Services Office [phone]   Chancy Milroy 02/27/2023, 1:49 PM

## 2023-02-27 NOTE — Progress Notes (Signed)
ANTICOAGULATION CONSULT NOTE - Follow up Consult   Pharmacy Consult for warfarin Indication: Hx PE/DVT  Allergies  Allergen Reactions   Codeine Hives and Other (See Comments)    Takes benadryl to stop allergic reactions    Patient Measurements: Weight: 92.3 kg (203 lb 7.8 oz)  Vital Signs: Temp: 97.6 F (36.4 C) (05/12 0738) BP: 143/55 (05/12 0738) Pulse Rate: 83 (05/12 0738)  Labs: Recent Labs    02/25/23 1613 02/25/23 1814 02/26/23 0352 02/27/23 0454  HGB 13.5  --   --  14.2  HCT 42.1  --   --  43.1  PLT 222  --   --  284  LABPROT 22.1*  --  20.4* 22.6*  INR 1.9*  --  1.7* 2.0*  CREATININE 2.23*  --  1.98* 1.53*  CKTOTAL  --   --   --  56  TROPONINIHS 10 11  --   --      Estimated Creatinine Clearance: 45.7 mL/min (A) (by C-G formula based on SCr of 1.53 mg/dL (H)).   Medical History: Past Medical History:  Diagnosis Date   AAA (abdominal aortic aneurysm) (HCC)    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     COPD (chronic obstructive pulmonary disease) (HCC)    DJD (degenerative joint disease)    Dyslipidemia    Gout    Malignant hypertension    Peripheral vascular disease (HCC)    Pneumonia 08/2013   pt. reports that he is having this surgery 11/25/2014- for the lung problem that began with pneumonia in 09/2014   Shortness of breath    Venous thrombosis    Recurrent    Assessment: 76 YOM presenting with dyspnea, hx of VTE on warfarin PTA with INR on admission subtherapeutic 1.9, pt states last dose 5/9  PTA dosing: 2mg  daily except 4mg  on Sat/Sun  INR therapeutic at 2.0 this AM. Will repeat dose of 4mg . This aligns with home regimen.   Goal of Therapy:  INR 2-3 Monitor platelets by anticoagulation protocol: Yes   Plan:  Give Warfarin 4mg  PO x 1 tonight Daily INR, s/s bleeding  Blane Ohara, PharmD  PGY1 Pharmacy Resident

## 2023-02-28 ENCOUNTER — Inpatient Hospital Stay (HOSPITAL_COMMUNITY): Payer: Medicare Other

## 2023-02-28 DIAGNOSIS — R0609 Other forms of dyspnea: Secondary | ICD-10-CM

## 2023-02-28 LAB — CBC
HCT: 41.7 % (ref 39.0–52.0)
Hemoglobin: 13.6 g/dL (ref 13.0–17.0)
MCH: 31.3 pg (ref 26.0–34.0)
MCHC: 32.6 g/dL (ref 30.0–36.0)
MCV: 96.1 fL (ref 80.0–100.0)
Platelets: 294 10*3/uL (ref 150–400)
RBC: 4.34 MIL/uL (ref 4.22–5.81)
RDW: 15.9 % — ABNORMAL HIGH (ref 11.5–15.5)
WBC: 10.4 10*3/uL (ref 4.0–10.5)
nRBC: 0 % (ref 0.0–0.2)

## 2023-02-28 LAB — ECHOCARDIOGRAM COMPLETE
AR max vel: 0.79 cm2
AV Area VTI: 0.71 cm2
AV Area mean vel: 0.69 cm2
AV Mean grad: 23.8 mmHg
AV Peak grad: 38.8 mmHg
Ao pk vel: 3.12 m/s
Area-P 1/2: 2.62 cm2
MV VTI: 1.41 cm2
Weight: 3255.75 oz

## 2023-02-28 LAB — RENAL FUNCTION PANEL
Albumin: 2.9 g/dL — ABNORMAL LOW (ref 3.5–5.0)
Anion gap: 6 (ref 5–15)
BUN: 34 mg/dL — ABNORMAL HIGH (ref 8–23)
CO2: 21 mmol/L — ABNORMAL LOW (ref 22–32)
Calcium: 8.6 mg/dL — ABNORMAL LOW (ref 8.9–10.3)
Chloride: 107 mmol/L (ref 98–111)
Creatinine, Ser: 1.63 mg/dL — ABNORMAL HIGH (ref 0.61–1.24)
GFR, Estimated: 43 mL/min — ABNORMAL LOW (ref >60)
Glucose, Bld: 110 mg/dL — ABNORMAL HIGH (ref 70–99)
Phosphorus: 2.1 mg/dL — ABNORMAL LOW (ref 2.5–4.6)
Potassium: 4.5 mmol/L (ref 3.5–5.1)
Sodium: 134 mmol/L — ABNORMAL LOW (ref 135–145)

## 2023-02-28 LAB — MAGNESIUM: Magnesium: 1.9 mg/dL (ref 1.7–2.4)

## 2023-02-28 LAB — PROTIME-INR
INR: 1.9 — ABNORMAL HIGH (ref 0.8–1.2)
Prothrombin Time: 21.8 seconds — ABNORMAL HIGH (ref 11.4–15.2)

## 2023-02-28 MED ORDER — PYRIDOXINE HCL 50 MG PO TABS: 50.0000 mg | ORAL_TABLET | Freq: Every day | ORAL | 0 refills | Status: AC

## 2023-02-28 MED ORDER — LOSARTAN POTASSIUM 50 MG PO TABS: 50.0000 mg | ORAL_TABLET | Freq: Every day | ORAL | 1 refills | Status: DC

## 2023-02-28 MED ORDER — CYANOCOBALAMIN 1000 MCG PO TABS: 1000.0000 ug | ORAL_TABLET | Freq: Every day | ORAL | 0 refills | Status: DC

## 2023-02-28 MED ORDER — BUDESONIDE-FORMOTEROL FUMARATE 160-4.5 MCG/ACT IN AERO: 2.0000 | INHALATION_SPRAY | Freq: Two times a day (BID) | RESPIRATORY_TRACT | 12 refills | Status: AC

## 2023-02-28 MED ORDER — WARFARIN SODIUM 4 MG PO TABS
4.0000 mg | ORAL_TABLET | Freq: Once | ORAL | Status: DC
  Filled 2023-02-28: qty 1

## 2023-02-28 MED ORDER — PANTOPRAZOLE SODIUM 40 MG PO TBEC: 40.0000 mg | DELAYED_RELEASE_TABLET | Freq: Every day | ORAL | 2 refills | Status: DC

## 2023-02-28 MED ORDER — PREDNISONE 20 MG PO TABS: 40.0000 mg | ORAL_TABLET | Freq: Every day | ORAL | 0 refills | Status: AC

## 2023-02-28 MED ORDER — GABAPENTIN 300 MG PO CAPS: 300.0000 mg | ORAL_CAPSULE | Freq: Two times a day (BID) | ORAL | 2 refills | Status: DC

## 2023-02-28 NOTE — Progress Notes (Signed)
ANTICOAGULATION CONSULT NOTE - Follow up Consult   Pharmacy Consult for warfarin Indication: Hx PE/DVT  Allergies  Allergen Reactions   Codeine Hives and Other (See Comments)    Takes benadryl to stop allergic reactions    Patient Measurements: Weight: 92.3 kg (203 lb 7.8 oz)  Vital Signs: Temp: 98.3 F (36.8 C) (05/13 0742) Temp Source: Oral (05/13 0742) BP: 141/66 (05/13 0742) Pulse Rate: 71 (05/13 0742)  Labs: Recent Labs    02/25/23 1613 02/25/23 1814 02/26/23 0352 02/27/23 0454 02/28/23 0440  HGB 13.5  --   --  14.2 13.6  HCT 42.1  --   --  43.1 41.7  PLT 222  --   --  284 294  LABPROT 22.1*  --  20.4* 22.6* 21.8*  INR 1.9*  --  1.7* 2.0* 1.9*  CREATININE 2.23*  --  1.98* 1.53* 1.63*  CKTOTAL  --   --   --  56  --   TROPONINIHS 10 11  --   --   --      Estimated Creatinine Clearance: 42.9 mL/min (A) (by C-G formula based on SCr of 1.63 mg/dL (H)).   Medical History: Past Medical History:  Diagnosis Date   AAA (abdominal aortic aneurysm) (HCC)    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     COPD (chronic obstructive pulmonary disease) (HCC)    DJD (degenerative joint disease)    Dyslipidemia    Gout    Malignant hypertension    Peripheral vascular disease (HCC)    Pneumonia 08/2013   pt. reports that he is having this surgery 11/25/2014- for the lung problem that began with pneumonia in 09/2014   Shortness of breath    Venous thrombosis    Recurrent    Assessment: 37 YOM presenting on 02/25/23 with dyspnea, hx of VTE on warfarin PTA with INR on admission subtherapeutic 1.9, pt states last dose 5/9.  PTA dosing: 2mg  daily except 4mg  on Sat/Sun  INR therapeutic down to 1.9 this AM, 3 days of 4 mg each day which is an increase from the home regimen, since patient admitted.   Goal of Therapy:  INR 2-3 Monitor platelets by anticoagulation protocol: Yes   Plan:  Give Warfarin 4mg  PO x 1  tonight Daily INR, s/s bleeding   Thank you for allowing pharmacy to be part of this patients care team. Noah Delaine, RPh Clinical Pharmacist Please check AMION for all [facility] phone numbers After 10:00 PM, call Main Pharmacy [phone]

## 2023-02-28 NOTE — Discharge Summary (Signed)
Physician Discharge Summary  Tony Zhang ZOX:[id number] DOB: [birth year] DOA: 02/25/2023  PCP: [name], MD  Admit date: 02/25/2023 Discharge date: 02/28/2023 Admitted From: Home Disposition: Home Recommendations for Outpatient Follow-up:  Follow up with PCP in 1 to 2 weeks Outpatient follow-up with cardiology as previously planned Recommend ambulatory referral to pulmonology for PFT Consider monthly vitamin B12 injection Check BP, CMP and CBC at follow-up Please follow up on the following pending results: None  Home Health: Not indicated Equipment/Devices: Not indicated  Discharge Condition: Stable CODE STATUS: Full code  Follow-up Information     Little, Fayrene Fearing, MD. Schedule an appointment as soon as possible for a visit in 1 week(s).   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  Patient has no formal diagnosis of COPD or asthma.  Desaturated to 89% on RA and started on 2 L by nasal cannula.  D-dimer negative for age.  BNP elevated to 370.  Troponin negative x 2.  CTA chest negative for PE or pneumonia.  Blood cultures obtained.  Started on IV Solu-Medrol, nebulizers and IV fluid and admitted for further care.   Patient's respiratory symptoms basically resolved.  He is discharged on p.o. prednisone for 3 more days.  Also started on Symbicort in addition to rescue inhaler.  Recommend ambulatory referral to pulmonology for  PFT.  Patient with history of moderate to severe aortic stenosis.  Is seen today with a plan to get echocardiogram outpatient.  Since patient is already in the hospital, echocardiogram obtained and showed severe aortic stenosis.  This was discussed with on-call cardiology, Dr. Jodelle Red who recommended outpatient follow-up as previously planned.  See individual problem list below for more.   Problems addressed during this hospitalization Principal Problem:   COPD with acute exacerbation (HCC) Active Problems:   Acute respiratory failure with hypoxia (HCC)   Hyperlipidemia   AKI (acute kidney injury) (HCC)   History of pulmonary embolism   Aortic stenosis   Acute respiratory failure with hypoxia: Presents with complaints of dyspnea, cough and wheezing.  Multifactorial including possible COPD exacerbation, progression of CHF or AV stenosis.  Patient denies formal diagnosis of COPD or asthma.  Never smoked cigarettes.  Not on oxygen at home.  CT chest did not show pneumonia or PE.  Respiratory failure and symptoms resolved.  Felt well and ready to go home. -Discharged on p.o. prednisone 50 mg daily for 3 days -Symbicort and as needed albuterol -Recommend ambulatory referral to pulmonology for PFT   COPD exacerbation: Management as above.   Chronic HFpEF: TTE with LVEF of 60 to 65%, G1 DD and severe AS (previously moderate to severe).  BNP 370.  Lungs clear.  Appears euvolemic on exam.  Diuretics held while on IV fluid. -Continue home meds.   Severe aortic stenosis: TTE as above. Followed by cardiology and felt to be a poor candidate for TAVR due to PAD/issues with femoral access.  Cardiology considering possible SAVR but his numerous  comorbidities may preclude that.  Cardiology planning on reviewing his case with structural heart team.  Discussed TTE finding with on-call cardiologist who recommended outpatient follow-up as previously planned   AKI on CKD-3A: Cr 2.23 (baseline  about 1.4).  AKI improved. Recent Labs    06/03/22 0257 06/04/22 0223 06/05/22 0254 06/06/22 0540 06/18/22 1035 09/28/22 1056 02/25/23 1613 02/26/23 0352 02/27/23 0454 02/28/23 0440  BUN 20 27* 28* 34* 15 20 31* 28* 30* 34*  CREATININE 1.22 1.28* 1.32* 1.67* 1.33* 1.44* 2.23* 1.98* 1.53* 1.63*  -Patient to resume losartan at reduced dose after 4 days. -Recheck renal function at follow-up  History of DVT/PE/subtherapeutic INR: INR therapeutic. -Continue home warfarin.   CAD status post PCI in August 2023: No further chest pain.  Serial troponin negative. -Continue home meds-Plavix, Lipitor and metoprolol.   Hypertension: BP within acceptable range. -Reduce losartan from 100 mg to 50 mg.  Patient to start this after 4 days. -Continue home meds   PAD/HLD -Continue statin, Plavix and cilostazol   Gout -Continue home allopurinol   BPH -Continue home Flomax   GERD -Change Prilosec to Protonix while on Plavix.   Peripheral neuropathy/severe vitamin B12 deficiency: Vitamin B12 level <50.  A1c 5.5%. -Vitamin B12 injection 2000 mcg daily x 2 doses.  Discharged on p.o. -Increased gabapentin to 300 mg twice daily.   Physical deconditioning -PT/OT          Pressure Injury 08/06/19 Sacrum Mid Stage I -  Intact skin with non-blanchable redness of a localized area usually over a bony prominence. redness on sacrum (Active)  08/06/19 0844  Location: Sacrum  Location Orientation: Mid  Staging: Stage I -  Intact skin with non-blanchable redness of a localized area usually over a bony prominence.  Wound Description (Comments): redness on sacrum  Present on Admission: Yes    Time spent 35 minutes  Vital signs Vitals:   02/27/23 2023 02/28/23 0400 02/28/23 0727 02/28/23 0742  BP:  (!) 169/47  (!) 141/66  Pulse:  66 66 71  Temp:  98 F (36.7 C)  98.3 F (36.8 C)  Resp:  16 18 18   Weight:      SpO2: 93% 96% 96% 96%  TempSrc:  Oral  Oral     Discharge  exam  GENERAL: No apparent distress.  Nontoxic. HEENT: MMM.  Vision and hearing grossly intact.  NECK: Supple.  No apparent JVD.  RESP:  No IWOB.  Fair aeration bilaterally. CVS:  RRR. Heart sounds normal.  ABD/GI/GU: BS+. Abd soft, NTND.  MSK/EXT:  Moves extremities. No apparent deformity. No edema.  SKIN: no apparent skin lesion or wound NEURO: Awake and alert. Oriented appropriately.  No apparent focal neuro deficit. PSYCH: Calm. Normal affect.   Discharge Instructions Discharge Instructions     Diet - low sodium heart healthy   Complete by: As directed    Discharge instructions   Complete by: As directed    It has been a pleasure taking care of you!  You were hospitalized due to shortness of breath, cough and chest pain that has improved with prednisone and breathing treatments.  We are discharging you more prednisone and breathing treatments.  We have also made some adjustment to your heart medication during this hospitalization.  Please review your new medication list and the directions on your medications before you take them.  Follow-up with your primary care doctor in 1 to 2 weeks or sooner if needed.  Follow-up with your cardiologist as previously planned.  Take care,   Increase activity slowly   Complete by: As directed       Allergies as of 02/28/2023       Reactions   Codeine Hives, Other (See Comments)   Takes benadryl to stop allergic reactions        Medication List     STOP taking these medications    omeprazole 40 MG capsule Commonly known as: PRILOSEC       TAKE these medications    albuterol 108 (90 Base) MCG/ACT inhaler Commonly known as: VENTOLIN HFA Inhale 2 puffs into the lungs in the morning and at bedtime.   allopurinol 300 MG tablet Commonly known as: ZYLOPRIM Take 300 mg by mouth at bedtime.   atorvastatin 40 MG tablet Commonly known as: LIPITOR Take 40 mg by mouth daily.   budesonide-formoterol 160-4.5 MCG/ACT  inhaler Commonly known as: Symbicort Inhale 2 puffs into the lungs in the morning and at bedtime.   cilostazol 100 MG tablet Commonly known as: PLETAL Take 100 mg by mouth daily.   clopidogrel 75 MG tablet Commonly known as: PLAVIX Take 1 tablet (75 mg total) by mouth daily.   cyanocobalamin 1000 MCG tablet Take 1 tablet (1,000 mcg total) by mouth daily. Start taking on: Mar 03, 2023   empagliflozin 10 MG Tabs tablet Commonly known as: JARDIANCE Take 1 tablet (10 mg total) by mouth daily.   Fenofibric Acid 135 MG Cpdr Take 135 mg by mouth daily.   furosemide 20 MG tablet Commonly known as: Lasix Take 1 tablet (20 mg total) by mouth daily. Start on Tuesday 8/22   gabapentin 300 MG capsule Commonly known as: NEURONTIN Take 1 capsule (300 mg total) by mouth 2 (two) times daily. What changed:  medication strength how much to take   losartan 50 MG tablet Commonly known as: COZAAR Take 1 tablet (50 mg total) by mouth daily. Start taking on: Mar 02, 2023 What changed:  medication strength how much to take These instructions start on Mar 02, 2023. If you are unsure what to do until then, ask your doctor or other care provider.   metoprolol succinate 100 MG 24 hr tablet Commonly known as: TOPROL-XL Take 100 mg by mouth daily.   nitroGLYCERIN 0.3 MG SL tablet Commonly known as: NITROSTAT Place under tongue. May take every 5 minutes up to 3 doses for chest pain. If still having pain after 3 doses, call 911.   ondansetron 8 MG tablet Commonly known as: ZOFRAN Take 8 mg by mouth 2 (two) times daily as needed for vomiting or nausea.   pantoprazole 40 MG tablet Commonly known as: Protonix Take 1 tablet (40 mg total) by mouth daily.   predniSONE 20 MG tablet Commonly known as: DELTASONE Take 2 tablets (40 mg total) by mouth daily with breakfast for 3 days.   pyridOXINE 50 MG tablet Commonly known as: B-6 Take 1 tablet (50 mg total) by mouth daily.   Repatha  SureClick 140 MG/ML Soaj Generic drug: Evolocumab Inject 140 mg into the skin every 14 (fourteen) days.   spironolactone 25 MG tablet Commonly known as: ALDACTONE Take 0.5 tablets (12.5 mg total) by mouth daily.   tamsulosin 0.4 MG Caps capsule Commonly known as: FLOMAX Take 0.4 mg by mouth every evening.   warfarin 2 MG tablet Commonly known as: COUMADIN Take as directed. If you are unsure how to take this medication, talk to your nurse or doctor. Original instructions: Take 2-4 mg by mouth See admin  instructions. Take 2 tablets (4mg ) by mouth Saturday and Sunday, then take 1 tablet (2mg) all other days        Consultations: Cardiology  Procedures/Studies:   ECHOCARDIOGRAM COMPLETE  Result Date: 02/28/2023    ECHOCARDIOGRAM REPORT   Patient Name:   Alif F Noteboom Date of Exam: 02/28/2023 Medical Rec #:  [id number]     Height:       73.0 in Accession #:    [id number]    Weight:       203.5 lb Date of Birth:  03/07/1946     BSA:          2.167 m Patient Age:    76 years      BP:           141/66 mmHg Patient Gender: M             HR:           64  bpm. Exam Location:  Inpatient Procedure: 2D Echo, Color Doppler and Cardiac Doppler Indications:    Dyspnea  History:        Patient has prior history of Echocardiogram examinations, most                 recent 06/03/2022. Previous Myocardial Infarction, COPD, Aortic                 Valve Disease; Risk Factors:Dyslipidemia and Hypertension.  Sonographer:    Darlys Gales Referring Phys: [id number] Ruthie Berch T Darek Eifler IMPRESSIONS  1. Aortic valve calcified with reduced cusp excursion; severe AS (mean gradient 24 mmHg; DI 0.25; AVA 0.7 cm2); note AS not well interrogated; mean gradient 06/03/22 was 36 mmHg.  2. Left ventricular ejection fraction, by estimation, is 60 to 65%. The left ventricle has normal function. The left ventricle has no regional wall motion abnormalities. Left ventricular diastolic parameters are consistent with Grade I diastolic dysfunction  (impaired relaxation). Elevated left atrial pressure.  3. Right ventricular systolic function is normal. The right ventricular size is normal.  4. Left atrial size was severely dilated.  5. The mitral valve is normal in structure. Trivial mitral valve regurgitation. No evidence of mitral stenosis.  6. The aortic valve is calcified. Aortic valve regurgitation is mild. Severe aortic valve stenosis.  7. The inferior vena cava is normal in size with greater than 50% respiratory variability, suggesting right atrial pressure of 3 mmHg. FINDINGS  Left Ventricle: Left ventricular ejection fraction, by estimation, is 60 to 65%. The left ventricle has normal function. The left ventricle has no regional wall motion abnormalities. The left ventricular internal cavity size was normal in size. There is  no left ventricular hypertrophy. Left ventricular diastolic parameters are consistent with Grade I diastolic dysfunction (impaired relaxation). Elevated left atrial pressure. Right Ventricle: The right ventricular size is normal. Right ventricular systolic function is normal. Left Atrium: Left atrial size was severely dilated. Right Atrium: Right atrial size was normal in size. Pericardium: There is no evidence of pericardial effusion. Mitral Valve: The mitral valve is normal in structure. Mild mitral annular calcification. Trivial mitral valve regurgitation. No evidence of mitral valve stenosis. MV peak gradient, 4.7 mmHg. The mean mitral valve gradient is 2.0 mmHg. Tricuspid Valve: The tricuspid valve is normal in structure. Tricuspid valve regurgitation is trivial. No evidence of tricuspid stenosis. Aortic Valve: The aortic valve is calcified. Aortic valve regurgitation is mild. Severe aortic stenosis is present. Aortic valve mean gradient measures 23.8 mmHg. Aortic valve peak gradient measures 38.8  mmHg. Aortic valve area, by VTI measures 0.71 cm. Pulmonic Valve: The pulmonic valve was normal in structure. Pulmonic valve  regurgitation is not visualized. No evidence of pulmonic stenosis. Aorta: The aortic root is normal in size and structure. Venous: The inferior vena cava is normal in size with greater than 50% respiratory variability, suggesting right atrial pressure of 3 mmHg. IAS/Shunts: No atrial level shunt detected by color flow Doppler. Additional Comments: Aortic valve calcified with reduced cusp excursion; severe AS (mean gradient 24 mmHg; DI 0.25; AVA 0.7 cm2); note AS not well interrogated; mean gradient 06/03/22 was 36 mmHg.  LEFT VENTRICLE PLAX 2D LVOT diam:     1.90 cm   Diastology LV SV:         51        LV e' medial:    6.53 cm/s LV SV Index:   24        LV E/e' medial:  15.9 LVOT Area:     2.84 cm  LV e' lateral:   6.53 cm/s                          LV E/e' lateral: 15.9  RIGHT VENTRICLE RV S prime:     13.40 cm/s TAPSE (M-mode): 2.4 cm LEFT ATRIUM              Index        RIGHT ATRIUM           Index LA Vol (A2C):   64.5 ml  29.76 ml/m  RA Area:     18.80 cm LA Vol (A4C):   109.0 ml 50.29 ml/m  RA Volume:   52.40 ml  24.18 ml/m LA Biplane Vol: 90.4 ml  41.71 ml/m  AORTIC VALVE AV Area (Vmax):    0.79 cm AV Area (Vmean):   0.69 cm AV Area (VTI):     0.71 cm AV Vmax:           311.50 cm/s AV Vmean:          232.000 cm/s AV VTI:            0.726 m AV Peak Grad:      38.8 mmHg AV Mean Grad:      23.8 mmHg LVOT Vmax:         87.10 cm/s LVOT Vmean:        56.100 cm/s LVOT VTI:          0.181 m LVOT/AV VTI ratio: 0.25 MITRAL VALVE                TRICUSPID VALVE MV Area (PHT): 2.62 cm     TR Peak grad:   35.8 mmHg MV Area VTI:   1.41 cm     TR Vmax:        299.00 cm/s MV Peak grad:  4.7 mmHg MV Mean grad:  2.0 mmHg     SHUNTS MV Vmax:       1.08 m/s     Systemic VTI:  0.18 m MV Vmean:      72.7 cm/s    Systemic Diam: 1.90 cm MV Decel Time: 289 msec MV E velocity: 104.00 cm/s MV A velocity: 113.00 cm/s MV E/A ratio:  0.92 Olga Millers MD Electronically signed by Olga Millers MD Signature Date/Time:  02/28/2023/10:30:08 AM    Final    CT Chest Wo Contrast  Result Date: 02/25/2023 CLINICAL DATA:  History of pneumonia with  persistent chest pain and shortness of breath EXAM: CT CHEST WITHOUT CONTRAST TECHNIQUE: Multidetector CT imaging of the chest was performed following the standard protocol without IV contrast. RADIATION DOSE REDUCTION: This exam was performed according to the departmental dose-optimization program which includes automated exposure control, adjustment of the mA and/or kV according to patient size and/or use of iterative reconstruction technique. COMPARISON:  Chest x-ray from earlier in the same day, CT from 06/02/2022. FINDINGS: Cardiovascular: Atherosclerotic calcifications are noted. Evaluation is somewhat limited due to lack of IV contrast. Some bulbous dilatation of the aortic arch is noted to 3.5 cm although normal distal tapering is seen. The heart is not significantly enlarged. Heavy coronary calcifications are noted. Pulmonary artery is not significantly enlarged. Mediastinum/Nodes: Thoracic inlet is within normal limits. No hilar or mediastinal adenopathy is noted. The esophagus as visualized is within normal limits. Lungs/Pleura: Diffuse emphysematous changes are identified. Scarring is noted in the bases bilaterally. Upper Abdomen: Visualized upper abdomen is unremarkable. Musculoskeletal: No chest wall mass or suspicious bone lesions identified. IMPRESSION: No acute infiltrate is seen. No acute abnormality to correspond with the given clinical history is seen. Mild dilatation of the aortic arch to 3.5 cm. Aortic Atherosclerosis (ICD10-I70.0) and Emphysema (ICD10-J43.9). Electronically Signed   By: Alcide Clever M.D.   On: 02/25/2023 19:24   DG Chest Port 1 View  Result Date: 02/25/2023 CLINICAL DATA:  Chest pain and shortness of breath. Recent diagnosis of pneumonia. EXAM: PORTABLE CHEST 1 VIEW COMPARISON:  Chest radiograph dated 06/02/2022 FINDINGS: Normal lung volumes. No  focal consolidations. Small bilateral pleural effusions. Enlarged cardiomediastinal silhouette is likely projectional. No acute osseous abnormality. IMPRESSION: 1. No focal consolidations. 2. Small bilateral pleural effusions. Electronically Signed   By: Agustin Cree M.D.   On: 02/25/2023 16:53       The results of significant diagnostics from this hospitalization (including imaging, microbiology, ancillary and laboratory) are listed below for reference.     Microbiology: Recent Results (from the past 240 hour(s))  Culture, blood (routine x 2)     Status: None (Preliminary result)   Collection Time: 02/25/23  3:55 PM   Specimen: BLOOD  Result Value Ref Range Status   Specimen Description BLOOD SITE NOT SPECIFIED  Final   Special Requests   Final    BOTTLES DRAWN AEROBIC AND ANAEROBIC Blood Culture adequate volume   Culture   Final    NO GROWTH 3 DAYS Performed at [facility], [address], [place], [place] [address]    Report Status PENDING  Incomplete  SARS Coronavirus 2 by RT PCR (hospital order, performed in [facility] hospital lab) *cepheid single result test* Anterior Nasal Swab     Status: None   Collection Time: 02/25/23  4:15 PM   Specimen: Anterior Nasal Swab  Result Value Ref Range Status   SARS Coronavirus 2 by RT PCR NEGATIVE NEGATIVE Final    Comment: Performed at [facility], [address] [place], [place] [address]  Culture, blood (routine x 2)     Status: None (Preliminary result)   Collection Time: 02/26/23  3:52 AM   Specimen: BLOOD  Result Value Ref Range Status   Specimen Description BLOOD BLOOD LEFT ARM  Final   Special Requests   Final    BOTTLES DRAWN AEROBIC AND ANAEROBIC Blood Culture adequate volume   Culture   Final    NO GROWTH 2 DAYS Performed at [facility], [address], [place], [place]  [address]    Report Status PENDING  Incomplete     Labs:  CBC: Recent Labs  Lab 02/25/23 1613 02/27/23 0454 02/28/23 0440  WBC  8.5 10.6* 10.4  NEUTROABS 6.8  --   --   HGB 13.5 14.2 13.6  HCT 42.1 43.1 41.7  MCV 97.9 95.8 96.1  PLT 222 284 294   BMP &GFR Recent Labs  Lab 02/25/23 1613 02/26/23 0352 02/27/23 0454 02/28/23 0440  NA 137 137 135 134*  K 4.4 3.9 3.8 4.5  CL 104 106 107 107  CO2 21* 21* 19* 21*  GLUCOSE 148* 167* 143* 110*  BUN 31* 28* 30* 34*  CREATININE 2.23* 1.98* 1.53* 1.63*  CALCIUM 8.5* 8.6* 8.6* 8.6*  MG  --   --  1.9 1.9  PHOS  --   --  2.8 2.1*   Estimated Creatinine Clearance: 42.9 mL/min (A) (by C-G formula based on SCr of 1.63 mg/dL (H)). Liver & Pancreas: Recent Labs  Lab 02/27/23 0454 02/28/23 0440  AST 17  --   ALT 12  --   ALKPHOS 38  --   BILITOT 0.9  --   PROT 6.6  --   ALBUMIN 2.9* 2.9*   No results for input(s): "LIPASE", "AMYLASE" in the last 168 hours. No results for input(s): "AMMONIA" in the last 168 hours. Diabetic: Recent Labs    02/26/23 1518  HGBA1C 5.5   No results for input(s): "GLUCAP" in the last 168 hours. Cardiac Enzymes: Recent Labs  Lab 02/27/23 0454  CKTOTAL 56   Recent Labs    08/27/22 0958 09/08/22 1004  PROBNP 450 432   Coagulation Profile: Recent Labs  Lab 02/25/23 1613 02/26/23 0352 02/27/23 0454 02/28/23 0440  INR 1.9* 1.7* 2.0* 1.9*   Thyroid Function Tests: Recent Labs    02/27/23 0454  TSH 0.531   Lipid Profile: No results for input(s): "CHOL", "HDL", "LDLCALC", "TRIG", "CHOLHDL", "LDLDIRECT" in the last 72 hours. Anemia Panel: Recent Labs    02/26/23 1518  VITAMINB12 <50*   Urine analysis:    Component Value Date/Time   COLORURINE YELLOW 02/25/2023 1614   APPEARANCEUR CLEAR 02/25/2023 1614   LABSPEC 1.011 02/25/2023 1614   PHURINE 6.0 02/25/2023 1614   GLUCOSEU 150 (A) 02/25/2023 1614   HGBUR NEGATIVE 02/25/2023 1614   BILIRUBINUR NEGATIVE 02/25/2023 1614   KETONESUR NEGATIVE 02/25/2023 1614   PROTEINUR NEGATIVE 02/25/2023 1614   UROBILINOGEN 0.2 04/08/2015 2033   NITRITE NEGATIVE  02/25/2023 1614   LEUKOCYTESUR NEGATIVE 02/25/2023 1614   Sepsis Labs: Invalid input(s): "PROCALCITONIN", "LACTICIDVEN"   SIGNED:  Almon Hercules, MD  [facility] 02/28/2023, 12:36 PM

## 2023-02-28 NOTE — Progress Notes (Signed)
  Echocardiogram 2D Echocardiogram has been performed.  Tony Zhang 02/28/2023, 8:34 AM

## 2023-02-28 NOTE — TOC Transition Note (Signed)
Transition of Care [facility]) - CM/SW Discharge Note   Patient Details  Name: Tony Zhang MRN: [id number] Date of Birth: 1946/08/27  Transition of [facility] CM/SW Contact:  Gordy Clement, RN Phone Number: 02/28/2023, 10:30 AM   Clinical Narrative:   Patient will DC tpo home today  HHPT is being recommended           Patient Goals and CMS Choice      Discharge Placement                         Discharge Plan and Services Additional resources added to the After Visit Summary for                                       Social Determinants of Health (SDOH) Interventions SDOH Screenings   Food Insecurity: No Food Insecurity (02/26/2023)  Housing: Low Risk  (02/26/2023)  Transportation Needs: No Transportation Needs (02/26/2023)  Utilities: Not At Risk (02/26/2023)  Alcohol Screen: Low Risk  (06/04/2022)  Financial Resource Strain: Low Risk  (06/04/2022)  Tobacco Use: Medium Risk (02/25/2023)     Readmission Risk Interventions     No data to display

## 2023-03-01 LAB — CULTURE, BLOOD (ROUTINE X 2)

## 2023-03-02 LAB — CULTURE, BLOOD (ROUTINE X 2)
Culture: NO GROWTH
Special Requests: ADEQUATE
Special Requests: ADEQUATE

## 2023-03-03 ENCOUNTER — Ambulatory Visit (HOSPITAL_COMMUNITY): Payer: Medicare Other

## 2023-03-03 LAB — CULTURE, BLOOD (ROUTINE X 2)

## 2023-03-11 ENCOUNTER — Encounter: Payer: Self-pay | Admitting: Cardiovascular Disease

## 2023-03-11 ENCOUNTER — Ambulatory Visit: Payer: Medicare Other | Attending: Cardiovascular Disease | Admitting: Cardiovascular Disease

## 2023-03-11 VITALS — BP 151/79 | HR 96 | Ht 73.0 in | Wt 203.0 lb

## 2023-03-11 DIAGNOSIS — I350 Nonrheumatic aortic (valve) stenosis: Secondary | ICD-10-CM

## 2023-03-11 NOTE — Progress Notes (Signed)
Cardiology Office Note:    Date:  03/11/2023   ID:  Tony Zhang, DOB 11-27-1945, MRN [id number]  PCP:  [name], MD   [facility] Providers Cardiologist:  Thurmon Fair, MD     Referring MD: [name], MD   No chief complaint on file.   History of Present Illness:    Tony Zhang is a 77 y.o. male referred for evaluation of severe aortic stenosis by Dr. Royann Shivers.  The patient is here alone today.  [place] is a wid[place]  He does not have children.  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  He has a pretty complicated medical history.  The patient has a history of coronary artery disease and he had a non-STEMI in August 2023.  At that time he underwent cardiac catheterization demonstrating critical stenosis of the proximal LAD, treated with PCI using a drug-eluting stent.  He was noted to have severe ostial RCA stenosis with heavy calcification.  Due to his comorbidities, medical therapy was recommended.  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  He has stage IIIb chronic kidney disease.  He has emphysema but is not on home oxygen.  The patient has been noted to have severe paradoxical low-flow low gradient aortic stenosis and he is referred for further evaluation.  He states that his breathing has actually been pretty stable.  He is able to do more now than he was at this time last year.  He feels like he is finally coming around from the time where he underwent PCI after being hospitalized last summer.  He is short of breath with any moderate level activity and has to stop and rest.  He denies orthopnea or PND.  He has not had any  recent chest pain or pressure.  His recent echocardiogram shows improvement in LV function with an LVEF now estimated at 60 to 65%.  Peak and mean transaortic gradients are 43 and 28 mmHg, respectively.  The patient's dimensionless index is 0.28 and his calculated aortic valve area is 0.7 to 0.8 cm.  Past Medical History:  Diagnosis Date   AAA (abdominal aortic aneurysm) (HCC)    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     COPD (chronic obstructive pulmonary disease) (HCC)    DJD (degenerative joint disease)    Dyslipidemia    Gout    Malignant hypertension    Peripheral vascular disease (HCC)    Pneumonia 08/2013   pt. reports that he is having this surgery 11/25/2014- for the lung problem that began with pneumonia in 09/2014   Shortness of breath    Venous thrombosis    Recurrent    Past Surgical History:  Procedure Laterality Date   APPENDECTOMY     BACK SURGERY     COLONOSCOPY N/A 04/13/2015   Procedure: COLONOSCOPY;  Surgeon: Carman Ching, MD;  Location: [facility] ENDOSCOPY;  Service: Endoscopy;  Laterality: N/A;   CORONARY STENT INTERVENTION N/A 06/04/2022   Procedure: CORONARY STENT INTERVENTION;  Surgeon: Corky Crafts, MD;  Location: [facility] INVASIVE CV LAB;  Service: Cardiovascular;  Laterality: N/A;   EMPYEMA DRAINAGE N/A 11/25/2014   Procedure: EMPYEMA DRAINAGE;  Surgeon: Delight Ovens, MD;  Location: [facility] OR;  Service: Thoracic;  Laterality: N/A;   ESOPHAGOGASTRODUODENOSCOPY N/A 04/10/2015   Procedure: ESOPHAGOGASTRODUODENOSCOPY (EGD);  Surgeon: Charlott Rakes, MD;  Location: [facility] ENDOSCOPY;  Service: Endoscopy;  Laterality: N/A;   EYE SURGERY     both eyes, cataracts removed, denies lens implants   FLEXIBLE SIGMOIDOSCOPY N/A 04/12/2015   Procedure: FLEXIBLE SIGMOIDOSCOPY;  Surgeon: Carman Ching, MD;  Location: [facility] ENDOSCOPY;  Service: Endoscopy;  Laterality: N/A;   HERNIA REPAIR     LEFT HEART CATH AND CORONARY  ANGIOGRAPHY N/A 06/04/2022   Procedure: LEFT HEART CATH AND CORONARY ANGIOGRAPHY;  Surgeon: Corky Crafts, MD;  Location: [facility] INVASIVE CV LAB;  Service: Cardiovascular;  Laterality: N/A;   SHOULDER SURGERY Right    VIDEO ASSISTED THORACOSCOPY Right 11/25/2014   Procedure: VIDEO ASSISTED THORACOSCOPY;  Surgeon: Delight Ovens, MD;  Location: [facility] OR;  Service: Thoracic;  Laterality: Right;   VIDEO BRONCHOSCOPY N/A 11/25/2014   Procedure: VIDEO BRONCHOSCOPY;  Surgeon: Delight Ovens, MD;  Location: [facility] OR;  Service: Thoracic;  Laterality: N/A;    Current Medications: Current Meds  Medication Sig   albuterol (VENTOLIN HFA) 108 (90 Base) MCG/ACT inhaler Inhale 2 puffs into the lungs in the morning and at bedtime.   allopurinol (ZYLOPRIM) 300 MG tablet Take 300 mg by mouth at bedtime.   atorvastatin (LIPITOR) 40 MG tablet Take 40 mg by mouth daily.   budesonide-formoterol (SYMBICORT) 160-4.5 MCG/ACT inhaler Inhale 2 puffs into the lungs in the morning and at bedtime.   Choline Fenofibrate (FENOFIBRIC ACID) 135 MG CPDR Take 135 mg by mouth daily.   cilostazol (PLETAL) 100 MG tablet Take 100 mg by mouth daily.   clopidogrel (PLAVIX) 75 MG tablet Take 1 tablet (75 mg total) by mouth daily.   cyanocobalamin 1000 MCG tablet Take 1 tablet (1,000 mcg total) by mouth daily.   empagliflozin (JARDIANCE) 10 MG TABS tablet Take 1 tablet (10 mg total) by mouth daily.   Evolocumab (REPATHA SURECLICK) 140 MG/ML SOAJ Inject 140 mg into the skin every 14 (fourteen) days.   furosemide (LASIX) 20 MG tablet Take 1 tablet (20 mg total) by mouth daily. Start on Tuesday 8/22   gabapentin (NEURONTIN) 300 MG capsule Take 1 capsule (300 mg total) by mouth 2 (two) times daily.   losartan (COZAAR) 50 MG tablet Take 1 tablet (50 mg total) by mouth daily.   metoprolol succinate (TOPROL-XL) 100 MG 24 hr tablet Take 100 mg by mouth daily.    nitroGLYCERIN (NITROSTAT) 0.3 MG SL tablet Place under tongue. May take every 5  minutes up to 3 doses for chest pain. If still having pain after 3 doses, call 911.   ondansetron (ZOFRAN) 8 MG tablet Take 8 mg by mouth 2 (two) times daily as needed for vomiting or nausea.   pantoprazole (PROTONIX) 40 MG tablet Take 1 tablet (40 mg total) by mouth daily.   pyridOXINE (B-6) 50 MG tablet Take 1 tablet (50 mg total) by mouth daily.   spironolactone (ALDACTONE) 25 MG tablet Take 0.5 tablets (12.5 mg total) by mouth daily.   tamsulosin (FLOMAX) 0.4 MG CAPS capsule Take 0.4 mg by mouth every evening.   warfarin (COUMADIN) 2 MG tablet Take 2-4 mg by mouth See admin instructions. Take 2 tablets (4mg ) by mouth Saturday and Sunday, then take 1 tablet (2mg ) all other days     Allergies:  Codeine   Social History   Socioeconomic History   Marital status: Widowed    Spouse name: Not on file   Number of children: 0   Years of education: Not on file   Highest education level: 6th grade  Occupational History    Comment: semi retired  Tobacco Use   Smoking status: Former    Types: Cigarettes    Quit date: 10/18/1992    Years since quitting: 30.4   Smokeless tobacco: Never  Vaping Use   Vaping Use: Never used  Substance and Sexual Activity   Alcohol use: No   Drug use: No   Sexual activity: Not on file  Other Topics Concern   Not on file  Social History Narrative   Not on file   Social Determinants of Health   Financial Resource Strain: Low Risk  (06/04/2022)   Overall Financial Resource Strain (CARDIA)    Difficulty of Paying Living Expenses: Not hard at all  Food Insecurity: No Food Insecurity (02/26/2023)   Hunger Vital Sign    Worried About Running Out of Food in the Last Year: Never true    Ran Out of Food in the Last Year: Never true  Transportation Needs: No Transportation Needs (02/26/2023)   PRAPARE - Administrator, Civil Service (Medical): No    Lack of Transportation (Non-Medical): No  Physical Activity: Not on file  Stress: Not on file   Social Connections: Not on file     Family History: The patient's family history includes CAD in his sister; CAD (age of onset: 76) in his father; Liver cancer in his brother.  ROS:   Please see the history of present illness.    All other systems reviewed and are negative.  EKGs/Labs/Other Studies Reviewed:    The following studies were reviewed today: Cardiac Studies & Procedures   CARDIAC CATHETERIZATION  CARDIAC CATHETERIZATION 06/04/2022  Narrative   Ost RCA to Prox RCA lesion is 80% stenosed.  Heavily calcified.  Would require atherectomy to fix.   Prox RCA lesion is 25% stenosed.   Mid RCA lesion is 70% stenosed.  Diffuse disease between the ostial lesion and this lesion.   3rd Mrg lesion is 75% stenosed.   1st Diag lesion is 50% stenosed.   Ost LAD to Mid LAD lesion is 25% stenosed.   Mid LAD lesion is 90% stenosed.   A drug-eluting stent was successfully placed using a SYNERGY XD 3.0X16, postdilated to greater than 3.5 mm.   Post intervention, there is a 0% residual stenosis.   The left ventricular systolic function is normal.   LV end diastolic pressure is normal.   The left ventricular ejection fraction is 50-55% by visual estimate.   There is no aortic valve stenosis.  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  There is moderate diffuse disease in the proximal LAD.  The culprit lesion was a severe stenosis in the mid LAD.  This was successfully treated with a 3.0 x 16 Synergy drug-eluting stent, postdilated to greater than 3.5 mm in diameter.  Resume IV heparin 2 hours after the TR band is removed.  Coumadin can also be resumed tonight.  Target INR greater than 2.  Aspirin will be used for 1 month and then stop.  Clopidogrel will be continued for ideally 12 months if no bleeding problems.  Plan discussed with Dr. Excell Seltzer who is the primary cardiologist.  Would plan medical therapy for his residual disease.  Findings Coronary Findings Diagnostic  Dominance:  Co-dominant  Left Anterior Descending Ost LAD to Mid LAD lesion is 25% stenosed. Mid LAD lesion is 90% stenosed. The lesion is eccentric and ulcerative.  First Diagonal Branch Vessel is small in size. 1st Diag lesion is 50% stenosed.  Left Circumflex The vessel exhibits minimal luminal irregularities.  Third Obtuse Marginal Branch Vessel is large in size. 3rd Mrg lesion is 75% stenosed.  Right Coronary Artery Ost RCA to Prox RCA lesion is 80% stenosed. The lesion is severely calcified. Prox RCA lesion is 25% stenosed. Mid RCA lesion is 70% stenosed.  Intervention  Mid LAD lesion Stent CATH LAUNCHER 6FR EBU3.5 guide catheter was inserted. Lesion crossed with guidewire using a WIRE ASAHI PROWATER 180CM. Pre-stent angioplasty was performed using a BALLN SAPPHIRE 2.5X12. A drug-eluting stent was successfully placed using a SYNERGY XD 3.0X16. Stent strut is well apposed. Post-stent angioplasty was performed using a BALL SAPPHIRE NC24 3.5X12. Post-Intervention Lesion Assessment The intervention was successful. Pre-interventional TIMI flow is 3. Post-intervention TIMI flow is 3. No complications occurred at this lesion. There is a 0% residual stenosis post intervention.     ECHOCARDIOGRAM  ECHOCARDIOGRAM COMPLETE 02/28/2023  Narrative ECHOCARDIOGRAM REPORT    Patient Name:   Tony Zhang Date of Exam: 02/28/2023 Medical Rec #:  [id number]     Height:       73.0 in Accession #:    [id number]    Weight:       203.5 lb Date of Birth:  [birth year]     BSA:          2.167 m Patient Age:    76 years      BP:           141/66 mmHg Patient Gender: M             HR:           64 bpm. Exam Location:  Inpatient  Procedure: 2D Echo, Color Doppler and Cardiac Doppler  Indications:    Dyspnea  History:        Patient has prior history of Echocardiogram examinations, most recent 06/03/2022. Previous Myocardial Infarction, COPD, Aortic Valve Disease; Risk Factors:Dyslipidemia and  Hypertension.  Sonographer:    Darlys Gales Referring Phys: [id number] TAYE T GONFA  IMPRESSIONS   1. Aortic valve calcified with reduced cusp excursion; severe AS (mean gradient 24 mmHg; DI 0.25; AVA 0.7 cm2); note AS not well interrogated; mean gradient 06/03/22 was 36 mmHg. 2. Left ventricular ejection fraction, by estimation, is 60 to 65%. The left ventricle has normal function. The left ventricle has no regional wall motion abnormalities. Left ventricular diastolic parameters are consistent with Grade I diastolic dysfunction (impaired relaxation). Elevated left atrial pressure. 3. Right ventricular systolic function is normal. The right ventricular size is normal. 4. Left atrial size was severely dilated. 5. The mitral valve is normal in structure. Trivial mitral valve regurgitation. No evidence of mitral stenosis. 6. The aortic valve is calcified. Aortic valve regurgitation is mild. Severe aortic valve stenosis. 7. The inferior vena cava is normal in size with greater than 50% respiratory variability, suggesting right atrial pressure of 3 mmHg.  FINDINGS Left Ventricle: Left ventricular ejection fraction, by estimation, is 60 to 65%. The left ventricle has normal function. The left ventricle has no regional wall motion abnormalities. The left ventricular internal cavity size was normal in size. There is no left ventricular hypertrophy. Left ventricular diastolic parameters are consistent with Grade I diastolic dysfunction (impaired relaxation). Elevated left  atrial pressure.  Right Ventricle: The right ventricular size is normal. Right ventricular systolic function is normal.  Left Atrium: Left atrial size was severely dilated.  Right Atrium: Right atrial size was normal in size.  Pericardium: There is no evidence of pericardial effusion.  Mitral Valve: The mitral valve is normal in structure. Mild mitral annular calcification. Trivial mitral valve regurgitation. No evidence of mitral  valve stenosis. MV peak gradient, 4.7 mmHg. The mean mitral valve gradient is 2.0 mmHg.  Tricuspid Valve: The tricuspid valve is normal in structure. Tricuspid valve regurgitation is trivial. No evidence of tricuspid stenosis.  Aortic Valve: The aortic valve is calcified. Aortic valve regurgitation is mild. Severe aortic stenosis is present. Aortic valve mean gradient measures 23.8 mmHg. Aortic valve peak gradient measures 38.8 mmHg. Aortic valve area, by VTI measures 0.71 cm.  Pulmonic Valve: The pulmonic valve was normal in structure. Pulmonic valve regurgitation is not visualized. No evidence of pulmonic stenosis.  Aorta: The aortic root is normal in size and structure.  Venous: The inferior vena cava is normal in size with greater than 50% respiratory variability, suggesting right atrial pressure of 3 mmHg.  IAS/Shunts: No atrial level shunt detected by color flow Doppler.  Additional Comments: Aortic valve calcified with reduced cusp excursion; severe AS (mean gradient 24 mmHg; DI 0.25; AVA 0.7 cm2); note AS not well interrogated; mean gradient 06/03/22 was 36 mmHg.   LEFT VENTRICLE PLAX 2D LVOT diam:     1.90 cm   Diastology LV SV:         51        LV e' medial:    6.53 cm/s LV SV Index:   24        LV E/e' medial:  15.9 LVOT Area:     2.84 cm  LV e' lateral:   6.53 cm/s LV E/e' lateral: 15.9   RIGHT VENTRICLE RV S prime:     13.40 cm/s TAPSE (M-mode): 2.4 cm  LEFT ATRIUM              Index        RIGHT ATRIUM           Index LA Vol (A2C):   64.5 ml  29.76 ml/m  RA Area:     18.80 cm LA Vol (A4C):   109.0 ml 50.29 ml/m  RA Volume:   52.40 ml  24.18 ml/m LA Biplane Vol: 90.4 ml  41.71 ml/m AORTIC VALVE AV Area (Vmax):    0.79 cm AV Area (Vmean):   0.69 cm AV Area (VTI):     0.71 cm AV Vmax:           311.50 cm/s AV Vmean:          232.000 cm/s AV VTI:            0.726 m AV Peak Grad:      38.8 mmHg AV Mean Grad:      23.8 mmHg LVOT Vmax:         87.10  cm/s LVOT Vmean:        56.100 cm/s LVOT VTI:          0.181 m LVOT/AV VTI ratio: 0.25  MITRAL VALVE                TRICUSPID VALVE MV Area (PHT): 2.62 cm     TR Peak grad:   35.8 mmHg MV Area VTI:   1.41 cm     TR Vmax:  299.00 cm/s MV Peak grad:  4.7 mmHg MV Mean grad:  2.0 mmHg     SHUNTS MV Vmax:       1.08 m/s     Systemic VTI:  0.18 m MV Vmean:      72.7 cm/s    Systemic Diam: 1.90 cm MV Decel Time: 289 msec MV E velocity: 104.00 cm/s MV A velocity: 113.00 cm/s MV E/A ratio:  0.92  Olga Millers MD Electronically signed by Olga Millers MD Signature Date/Time: 02/28/2023/10:30:08 AM    Final              EKG:  EKG is not ordered today.    Recent Labs: 09/08/2022: NT-Pro BNP 432 02/25/2023: B Natriuretic Peptide 370.4 02/27/2023: ALT 12; TSH 0.531 02/28/2023: BUN 34; Creatinine, Ser 1.63; Hemoglobin 13.6; Magnesium 1.9; Platelets 294; Potassium 4.5; Sodium 134  Recent Lipid Panel    Component Value Date/Time   CHOL 196 11/03/2022 0821   TRIG 302 (H) 11/03/2022 0821   HDL 35 (L) 11/03/2022 0821   CHOLHDL 5.6 (H) 11/03/2022 0821   CHOLHDL 3.9 06/03/2022 0257   VLDL 19 06/03/2022 0257   LDLCALC 109 (H) 11/03/2022 0821           Physical Exam:    VS:  BP (!) 151/79   Pulse 96   Ht 6\' 1"  (1.854 m)   Wt 203 lb (92.1 kg)   SpO2 94%   BMI 26.78 kg/m     Wt Readings from Last 3 Encounters:  03/11/23 203 lb (92.1 kg)  02/26/23 203 lb 7.8 oz (92.3 kg)  01/31/23 205 lb 12.8 oz (93.4 kg)     GEN:  Well nourished, well developed in no acute distress HEENT: Normal NECK: No JVD; No carotid bruits LYMPHATICS: No lymphadenopathy CARDIAC: RRR, distant heart sounds but grade 3/6 harsh crescendo decrescendo murmur is heard at the right upper sternal border with diminished A2 RESPIRATORY:  Clear to auscultation without rales, wheezing or rhonchi  ABDOMEN: Soft, non-tender, non-distended MUSCULOSKELETAL:  No edema; No deformity  SKIN: Warm and  dry NEUROLOGIC:  Alert and oriented x 3 PSYCHIATRIC:  Normal affect   ASSESSMENT:    1. Aortic valve stenosis, nonrheumatic    PLAN:    In order of problems listed above:  The patient appears to have moderately severe, symptomatic paradoxical low-flow low gradient aortic stenosis (stage D3).  His aortic valve is severely calcified and restricted on my personal review of his echo images.  His calculated valve area ranges between 0.7 and 0.8 cm and a stroke-volume index is less than 35.  He has New York Heart Association functional class II symptoms of fatigue and exertional dyspnea.  The patient is counseled regarding the natural history of aortic stenosis in the context of his comorbid medical conditions.  Treatment options were discussed with him today.  I do not think he would be a candidate for conventional surgery because of his significant comorbidities.  When considering TAVR, he would require general anesthesia and an alternative access approach due to his occluded abdominal aorta.  He would require further evaluation which would include a repeat right and left heart catheterization as well as CT angiography studies of the heart and the chest, abdomen, and pelvis.  Ideally he would be accessed via subclavian or carotid for alternative TAVR access.  All of this is discussed with him in detail today.  He favors continued surveillance in the context of his relatively mild symptoms and stable clinical status.  I will arrange for him to see Dr. Royann Shivers back in 6 months with a repeat echocardiogram.  I will plan to see him back in 1 year unless he requires evaluation for TAVR in the interim.  He is counseled regarding the cardinal symptoms of progressive aortic stenosis today and he understands to keep an eye out for these.           Medication Adjustments/Labs and Tests Ordered: Current medicines are reviewed at length with the patient today.  Concerns regarding medicines are outlined above.   Orders Placed This Encounter  Procedures   ECHOCARDIOGRAM COMPLETE   No orders of the defined types were placed in this encounter.   Patient Instructions  Medication Instructions:  Your physician recommends that you continue on your current medications as directed. Please refer to the Current Medication list given to you today.  *If you need a refill on your cardiac medications before your next appointment, please call your pharmacy*  Lab Work: If you have labs (blood work) drawn today and your tests are completely normal, you will receive your results only by: MyChart Message (if you have MyChart) OR A paper copy in the mail If you have any lab test that is abnormal or we need to change your treatment, we will call you to review the results.  Testing/Procedures: Your physician has requested that you have an echocardiogram in 5 months. Echocardiography is a painless test that uses sound waves to create images of your heart. It provides your doctor with information about the size and shape of your heart and how well your heart's chambers and valves are working. This procedure takes approximately one hour. There are no restrictions for this procedure. Please do NOT wear cologne, perfume, aftershave, or lotions (deodorant is allowed). Please arrive 15 minutes prior to your appointment time.  Follow-Up: At [facility], you and your health needs are our priority.  As part of our continuing mission to provide you with exceptional heart care, we have created designated Provider Care Teams.  These Care Teams include your primary Cardiologist (physician) and Advanced Practice Providers (APPs -  Physician Assistants and Nurse Practitioners) who all work together to provide you with the care you need, when you need it.  We recommend signing up for the patient portal called "MyChart".  Sign up information is provided on this After Visit Summary.  MyChart is used to connect with patients  for Virtual Visits (Telemedicine).  Patients are able to view lab/test results, encounter notes, upcoming appointments, etc.  Non-urgent messages can be sent to your provider as well.   To learn more about what you can do with MyChart, go to [email or web address].    Your next appointment:   6 month(s)  Provider:   Thurmon Fair, MD        Signed, Tonny Bollman, MD  03/11/2023 6:26 PM    [facility]

## 2023-03-11 NOTE — Patient Instructions (Signed)
Medication Instructions:  Your physician recommends that you continue on your current medications as directed. Please refer to the Current Medication list given to you today.  *If you need a refill on your cardiac medications before your next appointment, please call your pharmacy*  Lab Work: If you have labs (blood work) drawn today and your tests are completely normal, you will receive your results only by: MyChart Message (if you have MyChart) OR A paper copy in the mail If you have any lab test that is abnormal or we need to change your treatment, we will call you to review the results.  Testing/Procedures: Your physician has requested that you have an echocardiogram in 5 months. Echocardiography is a painless test that uses sound waves to create images of your heart. It provides your doctor with information about the size and shape of your heart and how well your heart's chambers and valves are working. This procedure takes approximately one hour. There are no restrictions for this procedure. Please do NOT wear cologne, perfume, aftershave, or lotions (deodorant is allowed). Please arrive 15 minutes prior to your appointment time.  Follow-Up: At [facility], you and your health needs are our priority.  As part of our continuing mission to provide you with exceptional heart care, we have created designated Provider Care Teams.  These Care Teams include your primary Cardiologist (physician) and Advanced Practice Providers (APPs -  Physician Assistants and Nurse Practitioners) who all work together to provide you with the care you need, when you need it.  We recommend signing up for the patient portal called "MyChart".  Sign up information is provided on this After Visit Summary.  MyChart is used to connect with patients for Virtual Visits (Telemedicine).  Patients are able to view lab/test results, encounter notes, upcoming appointments, etc.  Non-urgent messages can be sent to your  provider as well.   To learn more about what you can do with MyChart, go to [email or web address].    Your next appointment:   6 month(s)  Provider:   Thurmon Fair, MD

## 2023-04-29 ENCOUNTER — Ambulatory Visit: Payer: Medicare Other | Attending: Cardiovascular Disease | Admitting: Cardiovascular Disease

## 2023-04-29 ENCOUNTER — Encounter: Payer: Self-pay | Admitting: Cardiovascular Disease

## 2023-04-29 VITALS — BP 126/72 | HR 70 | Ht 73.0 in | Wt 210.0 lb

## 2023-04-29 DIAGNOSIS — N1832 Chronic kidney disease, stage 3b: Secondary | ICD-10-CM

## 2023-04-29 DIAGNOSIS — E7841 Elevated Lipoprotein(a): Secondary | ICD-10-CM

## 2023-04-29 DIAGNOSIS — Z8711 Personal history of peptic ulcer disease: Secondary | ICD-10-CM

## 2023-04-29 DIAGNOSIS — I25118 Atherosclerotic heart disease of native coronary artery with other forms of angina pectoris: Secondary | ICD-10-CM

## 2023-04-29 DIAGNOSIS — I350 Nonrheumatic aortic (valve) stenosis: Secondary | ICD-10-CM

## 2023-04-29 DIAGNOSIS — I7409 Other arterial embolism and thrombosis of abdominal aorta: Secondary | ICD-10-CM

## 2023-04-29 DIAGNOSIS — I5032 Chronic diastolic (congestive) heart failure: Secondary | ICD-10-CM

## 2023-04-29 DIAGNOSIS — Z79899 Other long term (current) drug therapy: Secondary | ICD-10-CM | POA: Diagnosis not present

## 2023-04-29 DIAGNOSIS — I150 Renovascular hypertension: Secondary | ICD-10-CM

## 2023-04-29 DIAGNOSIS — G629 Polyneuropathy, unspecified: Secondary | ICD-10-CM

## 2023-04-29 DIAGNOSIS — Z8739 Personal history of other diseases of the musculoskeletal system and connective tissue: Secondary | ICD-10-CM

## 2023-04-29 DIAGNOSIS — I7141 Pararenal abdominal aortic aneurysm, without rupture: Secondary | ICD-10-CM

## 2023-04-29 DIAGNOSIS — Z86711 Personal history of pulmonary embolism: Secondary | ICD-10-CM

## 2023-04-29 DIAGNOSIS — E7800 Pure hypercholesterolemia, unspecified: Secondary | ICD-10-CM

## 2023-04-29 DIAGNOSIS — I739 Peripheral vascular disease, unspecified: Secondary | ICD-10-CM | POA: Diagnosis not present

## 2023-04-29 LAB — BASIC METABOLIC PANEL
BUN/Creatinine Ratio: 14 (ref 10–24)
BUN: 24 mg/dL (ref 8–27)
CO2: 21 mmol/L (ref 20–29)
Calcium: 9.4 mg/dL (ref 8.6–10.2)
Chloride: 104 mmol/L (ref 96–106)
Creatinine, Ser: 1.69 mg/dL — ABNORMAL HIGH (ref 0.76–1.27)
Glucose: 87 mg/dL (ref 70–99)
Potassium: 5.6 mmol/L — ABNORMAL HIGH (ref 3.5–5.2)
Sodium: 141 mmol/L (ref 134–144)
eGFR: 41 mL/min/{1.73_m2} — ABNORMAL LOW (ref >59)

## 2023-04-29 LAB — LIPID PANEL
Chol/HDL Ratio: 3.2 ratio (ref 0.0–5.0)
Cholesterol, Total: 121 mg/dL (ref 100–199)
HDL: 38 mg/dL — ABNORMAL LOW (ref >39)
LDL Chol Calc (NIH): 49 mg/dL (ref 0–99)
Triglycerides: 210 mg/dL — ABNORMAL HIGH (ref 0–149)
VLDL Cholesterol Cal: 34 mg/dL (ref 5–40)

## 2023-04-29 NOTE — Patient Instructions (Addendum)
Medication Instructions:  Discontinue Plavix *If you need a refill on your cardiac medications before your next appointment, please call your pharmacy*   Lab Work: BMET, Lipid panel- today If you have labs (blood work) drawn today and your tests are completely normal, you will receive your results only by: MyChart Message (if you have MyChart) OR A paper copy in the mail If you have any lab test that is abnormal or we need to change your treatment, we will call you to review the results.  Follow-Up: At [facility], you and your health needs are our priority.  As part of our continuing mission to provide you with exceptional heart care, we have created designated Provider Care Teams.  These Care Teams include your primary Cardiologist (physician) and Advanced Practice Providers (APPs -  Physician Assistants and Nurse Practitioners) who all work together to provide you with the care you need, when you need it.  We recommend signing up for the patient portal called "MyChart".  Sign up information is provided on this After Visit Summary.  MyChart is used to connect with patients for Virtual Visits (Telemedicine).  Patients are able to view lab/test results, encounter notes, upcoming appointments, etc.  Non-urgent messages can be sent to your provider as well.   To learn more about what you can do with MyChart, go to [email or web address].    Your next appointment:    Appointment with APP the Next-to-last week in September (prior to colonoscopy/pre-op)  6 months with Dr Royann Shivers

## 2023-04-29 NOTE — Progress Notes (Unsigned)
Cardiology Office Note:    Date:  04/30/2023   ID:  Tony Zhang, DOB 03/01/1946, MRN [id number]  PCP:  [name], MD   [facility] Providers Cardiologist:  Thurmon Fair, MD     Referring MD: [name], MD (retired, now sees Dr "Rosalia Hammers" in [place]).  Chief Complaint  Patient presents with   Cardiac Valve Problem    History of Present Illness:    Tony Zhang is a 77 y.o. 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  He is on antiplatelet therapy with clopidogrel but aspirin has been stopped since he also takes warfarin for venous thromboembolic disease.  He has no specific cardiac complaints, but he remains quite sedentary due to problems with bilateral lower extremity neuropathy and back pain.  He does develop NYHA functional class II exertional dyspnea, but usually his legs and his back stop him before that develops.  Gabapentin has helped his neuropathy, but not his back pain.  He has not had angina pectoris.  He denies dizziness or syncope.  He does not have orthopnea, PND or lower extremity edema.  He saw Dr. Excell Seltzer to discuss TAVR.  Since he remained pretty much asymptomatic the decision was made to delay his scans.  It seems likely that we will be able to perform TAVR through a left subclavian or left carotid approach, but his abdominal aorta is occluded so transfemoral access is not possible.  He continues to have problems with persistent diarrhea and nausea.  He saw gastroenterologist and is  planning to have a colonoscopy in October.  In 2016 he had a pyloric ulcer and had endoscopy performed by Dr. Bosie Clos.  He has not seen an endoscopic evaluation since then.  He has not had problems with urinary tract infection/groin yeast infections/perineal inflammation since taking t Jardiance and this seems to have helped his symptoms of shortness of breath.  He is compliant with Repatha for hypercholesterolemia.  His metabolic parameters are pretty good with a hemoglobin A1c of 5.5% and LDL (checked today) of 49.  Even his HDL is a little higher than before 38, although still not in desirable range.  He is on chronic warfarin anticoagulation and has not had falls or bleeding problems.  INR is followed in [place].  Antiplatelet agents have been stopped.  Blood pressure is generally higher in the right arm by about 20 mmHg and is currently well-controlled.  His blood pressure was a little high when he first checked him, but was quite normal when I rechecked it a few minutes later.   Past Medical History:  Diagnosis Date   AAA (abdominal aortic aneurysm) (HCC)    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     COPD (chronic obstructive pulmonary disease) (HCC)    DJD (degenerative joint disease)    Dyslipidemia    Gout    Malignant hypertension    Peripheral vascular disease (HCC)    Pneumonia 08/2013   pt. reports that he is having this surgery 11/25/2014- for the lung  problem that began with pneumonia in 09/2014   Shortness of breath    Venous thrombosis    Recurrent    Past Surgical History:  Procedure Laterality Date   APPENDECTOMY     BACK SURGERY     COLONOSCOPY N/A 04/13/2015   Procedure: COLONOSCOPY;  Surgeon: Carman Ching, MD;  Location: [facility] ENDOSCOPY;  Service: Endoscopy;  Laterality: N/A;   CORONARY STENT INTERVENTION N/A 06/04/2022   Procedure: CORONARY STENT INTERVENTION;  Surgeon: Corky Crafts, MD;   Location: [facility] INVASIVE CV LAB;  Service: Cardiovascular;  Laterality: N/A;   EMPYEMA DRAINAGE N/A 11/25/2014   Procedure: EMPYEMA DRAINAGE;  Surgeon: Delight Ovens, MD;  Location: [facility] OR;  Service: Thoracic;  Laterality: N/A;   ESOPHAGOGASTRODUODENOSCOPY N/A 04/10/2015   Procedure: ESOPHAGOGASTRODUODENOSCOPY (EGD);  Surgeon: Charlott Rakes, MD;  Location: [facility] ENDOSCOPY;  Service: Endoscopy;  Laterality: N/A;   EYE SURGERY     both eyes, cataracts removed, denies lens implants   FLEXIBLE SIGMOIDOSCOPY N/A 04/12/2015   Procedure: FLEXIBLE SIGMOIDOSCOPY;  Surgeon: Carman Ching, MD;  Location: [facility] ENDOSCOPY;  Service: Endoscopy;  Laterality: N/A;   HERNIA REPAIR     LEFT HEART CATH AND CORONARY ANGIOGRAPHY N/A 06/04/2022   Procedure: LEFT HEART CATH AND CORONARY ANGIOGRAPHY;  Surgeon: Corky Crafts, MD;  Location: [facility] INVASIVE CV LAB;  Service: Cardiovascular;  Laterality: N/A;   SHOULDER SURGERY Right    VIDEO ASSISTED THORACOSCOPY Right 11/25/2014   Procedure: VIDEO ASSISTED THORACOSCOPY;  Surgeon: Delight Ovens, MD;  Location: [facility] OR;  Service: Thoracic;  Laterality: Right;   VIDEO BRONCHOSCOPY N/A 11/25/2014   Procedure: VIDEO BRONCHOSCOPY;  Surgeon: Delight Ovens, MD;  Location: [facility] OR;  Service: Thoracic;  Laterality: N/A;    Current Medications: Current Meds  Medication Sig   albuterol (VENTOLIN HFA) 108 (90 Base) MCG/ACT inhaler Inhale 2 puffs into the lungs in the morning and at bedtime.   allopurinol (ZYLOPRIM) 300 MG tablet Take 300 mg by mouth at bedtime.   budesonide-formoterol (SYMBICORT) 160-4.5 MCG/ACT inhaler Inhale 2 puffs into the lungs in the morning and at bedtime.   Choline Fenofibrate (FENOFIBRIC ACID) 135 MG CPDR Take 135 mg by mouth daily.   cilostazol (PLETAL) 100 MG tablet Take 100 mg by mouth daily.   cyanocobalamin 1000 MCG tablet Take 1 tablet (1,000 mcg total) by mouth daily.   empagliflozin (JARDIANCE) 10 MG TABS tablet Take 1 tablet (10 mg total) by  mouth daily.   Evolocumab (REPATHA SURECLICK) 140 MG/ML SOAJ Inject 140 mg into the skin every 14 (fourteen) days.   furosemide (LASIX) 20 MG tablet Take 1 tablet (20 mg total) by mouth daily. Start on Tuesday 8/22   gabapentin (NEURONTIN) 300 MG capsule Take 1 capsule (300 mg total) by mouth 2 (two) times daily.   losartan (COZAAR) 50 MG tablet Take 1 tablet (50 mg total) by mouth daily.   metoprolol succinate (TOPROL-XL) 100 MG 24 hr tablet Take 100 mg by mouth daily.    pyridOXINE (B-6) 50 MG tablet Take 1 tablet (50 mg total) by mouth daily.   spironolactone (ALDACTONE) 25 MG tablet Take 0.5 tablets (12.5 mg total) by mouth daily.   tamsulosin (FLOMAX) 0.4 MG CAPS capsule Take 0.4 mg by mouth every evening.   warfarin (COUMADIN) 2 MG tablet Take 2-4 mg by mouth See admin instructions. Take 2 tablets (4mg ) by mouth Saturday and Sunday, then take 1 tablet (2mg ) all other days     Allergies:   Codeine  Social History   Socioeconomic History   Marital status: Widowed    Spouse name: Not on file   Number of children: 0   Years of education: Not on file   Highest education level: 6th grade  Occupational History    Comment: semi retired  Tobacco Use   Smoking status: Former    Current packs/day: 0.00    Types: Cigarettes    Quit date: 10/18/1992    Years since quitting: 30.5   Smokeless tobacco: Never  Vaping Use   Vaping status: Never Used  Substance and Sexual Activity   Alcohol use: No   Drug use: No   Sexual activity: Not on file  Other Topics Concern   Not on file  Social History Narrative   Not on file   Social Determinants of Health   Financial Resource Strain: Low Risk  (06/04/2022)   Overall Financial Resource Strain (CARDIA)    Difficulty of Paying Living Expenses: Not hard at all  Food Insecurity: No Food Insecurity (02/26/2023)   Hunger Vital Sign    Worried About Running Out of Food in the Last Year: Never true    Ran Out of Food in the Last Year: Never true   Transportation Needs: No Transportation Needs (02/26/2023)   PRAPARE - Administrator, Civil Service (Medical): No    Lack of Transportation (Non-Medical): No  Physical Activity: Not on file  Stress: Not on file  Social Connections: Not on file     Family History: The patient's family history includes CAD in his sister; CAD (age of onset: 2) in his father; Liver cancer in his brother.  ROS:   Please see the history of present illness.     All other systems reviewed and are negative.  EKGs/Labs/Other Studies Reviewed:    The following studies were reviewed today:  CT angiogram of the aorta  1. No evidence of acute aortic syndrome. 2. 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 3. 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 Recommend annual imaging followup by CTA or MRA. This recommendation follows 2010 ACCF/AHA/AATS/ACR/ASA/SCA/SCAI/SIR/STS/SVM Guidelines for the Diagnosis and Management of Patients with Thoracic Aortic Disease. Circulation.2010; 121: W098-J191. Aortic aneurysm NOS (ICD10-I71.9) 4.  Coronary and aortic Atherosclerosis (ICD10-I70.0).  Cardiac Cath 06/04/2022    Ost RCA to Prox RCA lesion is 80% stenosed.  Heavily calcified.  Would require atherectomy to fix.   Prox RCA lesion is 25% stenosed.   Mid RCA lesion is 70% stenosed.  Diffuse disease between the ostial lesion and this lesion.   3rd Mrg lesion is 75% stenosed.   1st Diag lesion is 50% stenosed.   Ost LAD to Mid LAD lesion is 25% stenosed.   Mid LAD lesion is 90% stenosed.   A drug-eluting stent was successfully placed using a SYNERGY XD 3.0X16, postdilated to greater than 3.5 mm.   Post intervention, there is a 0% residual stenosis.   The left ventricular systolic function is normal.   LV end diastolic pressure is normal.   The left ventricular ejection fraction is 50-55% by visual  estimate.   There is no aortic valve stenosis.   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  There is moderate diffuse disease in the proximal LAD.  The culprit lesion was a severe stenosis in the mid LAD.  This was successfully treated with a 3.0 x 16 Synergy drug-eluting stent, postdilated to greater than  3.5 mm in diameter.   Resume IV heparin 2 hours after the TR band is removed.  Coumadin can also be resumed tonight.  Target INR greater than 2.  Aspirin will be used for 1 month and then stop.  Clopidogrel will be continued for ideally 12 months if no bleeding problems.   Plan discussed with Dr. Excell Seltzer who is the primary cardiologist.  Would plan medical therapy for his residual disease.    Diagnostic Dominance: Co-dominant  Intervention    Permanent Stent  Synergy Xd 3.0x16    Echocardiogram 06/03/2022    1. Left ventricular ejection fraction, by estimation, is 50 to 55%. The  left ventricle has low normal function. The left ventricle demonstrates  regional wall motion abnormalities (see scoring diagram/findings for  description). Left ventricular diastolic   parameters are consistent with Grade I diastolic dysfunction (impaired  relaxation). Elevated left ventricular end-diastolic pressure. The E/e' is  33. There is severe hypokinesis of the left ventricular, mid-apical  inferior wall, inferoseptal wall and  apical segment. The apex appears anerusymal.   2. The mitral valve is abnormal. Trivial mitral valve regurgitation.   3. The aortic valve is calcified. There is severe calcifcation of the  aortic valve. Moderate to severe aortic valve stenosis. Aortic valve area,  by VTI measures 0.48 cm. Aortic valve mean gradient measures 36.0 mmHg.  Aortic valve Vmax measures 3.57  m/s. Peak gradient 51 mmHg, DI 0.21.   4. There is normal pulmonary artery systolic pressure.   5. The inferior vena cava is normal in size with greater than 50%  respiratory variability, suggesting  right atrial pressure of 3 mmHg.   Comparison(s): Prior images reviewed side by side. Changes from prior  study are noted. 02/22/2022: LVEF >75%, mild (probably moderate) AS - mean  gradient 16 mmHg, DI 0.33.   AORTIC VALVE  AV Area (Vmax):    0.47 cm  AV Area (Vmean):   0.46 cm  AV Area (VTI):     0.48 cm  AV Vmax:           357.00 cm/s  AV Vmean:          292.000 cm/s  AV VTI:            0.898 m  AV Peak Grad:      51.0 mmHg  AV Mean Grad:      36.0 mmHg  LVOT Vmax:         74.20 cm/s  LVOT Vmean:        59.800 cm/s  LVOT VTI:          0.189 m  LVOT/AV VTI ratio: 0.21    EKG:  EKG is ordered today.  Shows normal sinus rhythm, left axis deviation and old inferior wall myocardial infarction, anterolateral myocardial infarction, QTc 394 ms, no acute ischemic repolarization abnormalities.   Recent Labs: 09/08/2022: NT-Pro BNP 432 02/25/2023: B Natriuretic Peptide 370.4 02/27/2023: ALT 12; TSH 0.531 02/28/2023: Hemoglobin 13.6; Magnesium 1.9; Platelets 294 04/29/2023: BUN 24; Creatinine, Ser 1.69; Potassium 5.6; Sodium 141  Recent Lipid Panel    Component Value Date/Time   CHOL 121 04/29/2023 0842   TRIG 210 (H) 04/29/2023 0842   HDL 38 (L) 04/29/2023 0842   CHOLHDL 3.2 04/29/2023 0842   CHOLHDL 3.9 06/03/2022 0257   VLDL 19 06/03/2022 0257   LDLCALC 49 04/29/2023 0842     Risk Assessment/Calculations:  Physical Exam:    VS:  BP 126/72   Pulse 70   Ht 6\' 1"  (1.854 m)   Wt 210 lb (95.3 kg)   SpO2 93%   BMI 27.71 kg/m     Wt Readings from Last 3 Encounters:  04/29/23 210 lb (95.3 kg)  03/11/23 203 lb (92.1 kg)  02/26/23 203 lb 7.8 oz (92.3 kg)      General: Alert, oriented x3, no distress, appears comfortable Head: no evidence of trauma, PERRL, EOMI, no exophtalmos or lid lag, no myxedema, no xanthelasma; normal ears, nose and oropharynx Neck: normal jugular venous pulsations and no hepatojugular reflux; brisk carotid pulses without delay and  no carotid bruits Chest: clear to auscultation, no signs of consolidation by percussion or palpation, normal fremitus, symmetrical and full respiratory excursions Cardiovascular: normal position and quality of the apical impulse, regular rhythm, normal first  heart sound, but the second heart sound is not audible in the aortic focus , no diastolic murmurs, rubs or gallops Abdomen: no tenderness or distention, no masses by palpation, no abnormal pulsatility or arterial bruits, normal bowel sounds, no hepatosplenomegaly Extremities: No pulses palpable in his lower extremities. Neurological: grossly nonfocal Psych: Normal mood and affect   ASSESSMENT:    1. Aortic stenosis, severe   2. Medication management   3. Coronary artery disease of native artery of native heart with stable angina pectoris (HCC)   4. PAD (peripheral artery disease) (HCC)   5. Chronic distal aortic occlusion (HCC)   6. 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 Chronic diastolic heart failure, NYHA class 2 (HCC)   8. Renovascular hypertension   9. Pure hypercholesterolemia   10. Elevated Lp(a)   11. History of pulmonary embolism   12. Stage 3b chronic kidney disease (HCC)   13. Neuropathy   14. History of gout   15. History of gastric ulcer      PLAN:    In order of problems listed above:  AS: He has severe paradoxical low-flow low gradient aortic stenosis and his symptoms are likely masked by his orthopedic and neurological problems which limit his activity.  Nevertheless he does not feel ready to start the workup and treatment with TAVR.  Reminded him that he is to let us know promptly if he has worsening exertional dyspnea or if he develops exertional syncope or angina.  He will require alternative access TAVR due to occlusion of his abdominal aorta.  Although he has extensive coronary disease, I think his multiple comorbid conditions make him a poor candidate for surgical AVR.  Until  he undergoes TAVR, we need to carefully assess his heart failure compensation before he undergoes surgeries or deep sedation procedures. CAD: Denies angina pectoris.  No longer on antiplatelet agents since he is fully anticoagulated with warfarin.  Had a drug-eluting stent placed for ACS in the LAD artery in late August 2023.  Known to have a heavily calcified ostial right coronary artery stenosis, but this may be best left for medical therapy since it will be a challenge even with atherectomy.   PAD/chronic occlusion of the infrarenal abdominal aorta: His leg pain sounds more neurogenic than vascular.  He does not have claudication, may be because he is fairly sedentary.  Longstanding history of occlusion of the distal abdominal aorta with reconstitution at the level of the common femoral arteries via inferior epigastric arteries.  Based on previous imaging studies does not have a whole lot of disease in the  infrapopliteal distribution.   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  They both are appropriate for routine imaging follow-up on a yearly basis.  They do not require surgery at this time. CHF: NYHA functional class II/IIIA.  Borderline LVEF 50-55%.  He is clinically euvolemic, on Jardiance, spironolactone, ARB, low-dose loop diuretic HTN: Better controlled since we restarted his losartan.  May be in part related to renal artery stenosis.  We will have the opportunity to reevaluate this we do CT angiography as part of his workup for possible TAVR. HLP: Excellent response to Repatha, all lipid parameters are acceptable.  Note that he has a severely elevated LP(a).  May be a candidate for inclisiran. History of DVT/PE: Incidentally noted to have a persistent left-sided inferior vena cava.  On chronic warfarin for over 20 years.  Was seeing Dr. Truett Perna in the hematology clinic for  hypercoagulable state.  INR is monitored in [place].  Avoid NSAIDs.  Prefer acetaminophen or tramadol.  He prefers not to take oxycodone since this makes him feel unwell. CKD3B: Renal function has worsened a little bit this year, baseline now seems to be around 1.6 (GFR 40). Neuropathy: "Like walking on glass".  Improved neuropathy in his feet after starting gabapentin. History of gout: Avoid thiazide diuretics. Persistent nausea/diarrhea: Does not have postprandial pain or food aversion to suggest mesenteric ischemia, although he has known severe atherosclerosis in his visceral arteries.  Plan for colonoscopy in October.  I think we need to reevaluate him to make sure he is stable for the colonoscopy and sedation and will make an appointment in late September.  Has a history of gastric ulcer.  On chronic PPI.             Medication Adjustments/Labs and Tests Ordered: Current medicines are reviewed at length with the patient today.  Concerns regarding medicines are outlined above.  Orders Placed This Encounter  Procedures   Basic metabolic panel   Lipid panel   No orders of the defined types were placed in this encounter.   Patient Instructions  Medication Instructions:  Discontinue Plavix *If you need a refill on your cardiac medications before your next appointment, please call your pharmacy*   Lab Work: BMET, Lipid panel- today If you have labs (blood work) drawn today and your tests are completely normal, you will receive your results only by: MyChart Message (if you have MyChart) OR A paper copy in the mail If you have any lab test that is abnormal or we need to change your treatment, we will call you to review the results.  Follow-Up: At [facility], you and your health needs are our priority.  As part of our continuing mission to provide you with exceptional heart care, we have created designated Provider Care Teams.  These Care Teams include your primary  Cardiologist (physician) and Advanced Practice Providers (APPs -  Physician Assistants and Nurse Practitioners) who all work together to provide you with the care you need, when you need it.  We recommend signing up for the patient portal called "MyChart".  Sign up information is provided on this After Visit Summary.  MyChart is used to connect with patients for Virtual Visits (Telemedicine).  Patients are able to view lab/test results, encounter notes, upcoming appointments, etc.  Non-urgent messages can be sent to your provider as well.   To learn more about what you  can do with MyChart, go to [email or web address].    Your next appointment:    Appointment with APP the Next-to-last week in September (prior to colonoscopy/pre-op)  6 months with Dr Royann Shivers     Signed, Thurmon Fair, MD  04/30/2023 4:45 PM    [facility]

## 2023-05-11 ENCOUNTER — Other Ambulatory Visit (HOSPITAL_COMMUNITY): Payer: Self-pay

## 2023-05-25 ENCOUNTER — Telehealth: Payer: Self-pay | Admitting: Pharmacy Technician

## 2023-05-25 NOTE — Telephone Encounter (Signed)
Pharmacy Patient Advocate Encounter  Received notification from [facility] that Prior Authorization for Repatha SureClick 140MG /ML auto-injectors has been APPROVED from 05/11/2023 to 10/18/2023   PA #/Case ID/Reference #: [id number]  Key: [id number]

## 2023-07-09 NOTE — Progress Notes (Unsigned)
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 He is on antiplatelet therapy with clopidogrel but aspirin has been stopped since he also takes warfarin for venous thromboembolic disease.   Last seen by Dr. Royann Zhang on 04/30/2023.  He was offered TAVR, at that time he did not feel ready to start the workup and treatment he was to notify us to be a worsening symptoms of dyspnea or develops exertional syncope or angina.  He has been seen by Dr. Excell Zhang at the valvular heart clinic.  Dr. Royann Zhang also noted that she will require alternative access TAVR due to occlusion of his abdominal aorta. Although he has extensive coronary disease, Dr. Royann Zhang oh thinks his multiple comorbid conditions make him a poor candidate for surgical AVR. Until he undergoes TAVR, we need to carefully assess his heart failure compensation before he undergoes surgeries or deep sedation procedures.   Past Medical History    Past Medical History:  Diagnosis Date   AAA (abdominal aortic aneurysm) (HCC)    Blood dyscrasia    followed by Dr. Truett Zhang, for clotting issue, has been on Coumadin for about 20 yrs.     COPD (chronic obstructive pulmonary disease) (HCC)    DJD (degenerative joint disease)    Dyslipidemia    Gout    Malignant hypertension     Peripheral vascular disease (HCC)    Pneumonia 08/2013   pt. reports that he is having this surgery 11/25/2014- for the lung problem that began with pneumonia in 09/2014   Shortness of breath    Venous thrombosis    Recurrent   Past Surgical History:  Procedure Laterality Date   APPENDECTOMY     BACK SURGERY     COLONOSCOPY N/A 04/13/2015   Procedure: COLONOSCOPY;  Surgeon: Tony Ching, MD;  Location: [facility] ENDOSCOPY;  Service: Endoscopy;  Laterality: N/A;   CORONARY STENT INTERVENTION N/A 06/04/2022   Procedure: CORONARY STENT INTERVENTION;  Surgeon: Tony Crafts, MD;  Location: [facility] INVASIVE CV LAB;  Service: Cardiovascular;  Laterality: N/A;   EMPYEMA DRAINAGE N/A 11/25/2014   Procedure: EMPYEMA DRAINAGE;  Surgeon: Tony Ovens, MD;  Location: [facility] OR;  Service: Thoracic;  Laterality: N/A;   ESOPHAGOGASTRODUODENOSCOPY N/A 04/10/2015   Procedure: ESOPHAGOGASTRODUODENOSCOPY (EGD);  Surgeon: Tony Rakes, MD;  Location: [facility] ENDOSCOPY;  Service: Endoscopy;  Laterality: N/A;   EYE SURGERY     both eyes, cataracts removed, denies lens implants   FLEXIBLE SIGMOIDOSCOPY N/A 04/12/2015   Procedure: FLEXIBLE SIGMOIDOSCOPY;  Surgeon: Tony Ching, MD;  Location: [facility] ENDOSCOPY;  Service: Endoscopy;  Laterality: N/A;   HERNIA REPAIR     LEFT HEART CATH AND CORONARY ANGIOGRAPHY N/A 06/04/2022   Procedure: LEFT HEART CATH AND CORONARY ANGIOGRAPHY;  Surgeon: Tony Crafts, MD;  Location: [facility] INVASIVE CV LAB;  Service: Cardiovascular;  Laterality: N/A;   SHOULDER SURGERY Right    VIDEO ASSISTED THORACOSCOPY Right 11/25/2014   Procedure:  VIDEO ASSISTED THORACOSCOPY;  Surgeon: Tony Ovens, MD;  Location: [facility] OR;  Service: Thoracic;  Laterality: Right;   VIDEO BRONCHOSCOPY N/A 11/25/2014   Procedure: VIDEO BRONCHOSCOPY;  Surgeon: Tony Ovens, MD;  Location: [facility] OR;  Service: Thoracic;  Laterality: N/A;    Allergies  Allergies  Allergen Reactions   Codeine Hives and Other (See  Comments)    Takes benadryl to stop allergic reactions    History of Present Illness    [name] comes today complaining of weight gain.  He has been started on vitamin supplements and is not having any purposeful exercise.  He has chronic dyspnea on exertion.  He denies chest pain, palpitations, lower extremity edema, dizziness or presyncope.  He is on Coumadin therapy and denies any significant bleeding, no melena or hemoptysis.  His main complaint is neurologic pain and has had increased dose of gabapentin to 300 mg twice daily during hospitalization.  He is requesting refills as Dr. Royann Zhang has started him on this earlier.  He is due to have a colonoscopy but we have not received any preoperative request for clearance.  He is reluctant to move forward until he has further investigation of his aortic valve.  Home Medications    Current Outpatient Medications  Medication Sig Dispense Refill   albuterol (VENTOLIN HFA) 108 (90 Base) MCG/ACT inhaler Inhale 2 puffs into the lungs in the morning and at bedtime.     allopurinol (ZYLOPRIM) 300 MG tablet Take 300 mg by mouth at bedtime.     atorvastatin (LIPITOR) 40 MG tablet Take 40 mg by mouth daily.     budesonide-formoterol (SYMBICORT) 160-4.5 MCG/ACT inhaler Inhale 2 puffs into the lungs in the morning and at bedtime. 1 each 12   Choline Fenofibrate (FENOFIBRIC ACID) 135 MG CPDR Take 135 mg by mouth daily. 90 capsule 3   cilostazol (PLETAL) 100 MG tablet Take 100 mg by mouth daily.     cyanocobalamin 1000 MCG tablet Take 1 tablet (1,000 mcg total) by mouth daily. 90 tablet 0   empagliflozin (JARDIANCE) 10 MG TABS tablet Take 1 tablet (10 mg total) by mouth daily. 90 tablet 3   Evolocumab (REPATHA SURECLICK) 140 MG/ML SOAJ Inject 140 mg into the skin every 14 (fourteen) days. 2 mL 12   furosemide (LASIX) 20 MG tablet Take 1 tablet (20 mg total) by mouth daily. Start on Tuesday 8/22 90 tablet 3   losartan (COZAAR) 50 MG tablet Take 1 tablet (50  mg total) by mouth daily. 90 tablet 1   metoprolol succinate (TOPROL-XL) 100 MG 24 hr tablet Take 100 mg by mouth daily.      nitroGLYCERIN (NITROSTAT) 0.3 MG SL tablet Place under tongue. May take every 5 minutes up to 3 doses for chest pain. If still having pain after 3 doses, call 911. 30 tablet 0   ondansetron (ZOFRAN) 8 MG tablet Take 8 mg by mouth 2 (two) times daily as needed for vomiting or nausea.     pyridOXINE (B-6) 50 MG tablet Take 1 tablet (50 mg total) by mouth daily. 90 tablet 0   spironolactone (ALDACTONE) 25 MG tablet Take 0.5 tablets (12.5 mg total) by mouth daily. 15 tablet 3   tamsulosin (FLOMAX) 0.4 MG CAPS capsule Take 0.4 mg by mouth every evening.     warfarin (COUMADIN) 2 MG tablet Take 2-4 mg by mouth See admin instructions. Take 2 tablets (4mg ) by mouth Saturday and Sunday, then take 1 tablet (2mg ) all other days  gabapentin (NEURONTIN) 300 MG capsule Take 1 capsule (300 mg total) by mouth 2 (two) times daily. 60 capsule 2   No current facility-administered medications for this visit.     Family History    Family History  Problem Relation Age of Onset   CAD Father 42   Liver cancer Brother    CAD Sister    He indicated that his mother is deceased. He indicated that his father is deceased. He indicated that the status of his sister is unknown. He indicated that the status of his brother is unknown. He indicated that his maternal grandmother is deceased. He indicated that his maternal grandfather is deceased. He indicated that his paternal grandmother is deceased. He indicated that his paternal grandfather is deceased.  Social History    Social History   Socioeconomic History   Marital status: Widowed    Spouse name: Not on file   Number of children: 0   Years of education: Not on file   Highest education level: 6th grade  Occupational History    Comment: semi retired  Tobacco Use   Smoking status: Former    Current packs/day: 0.00    Types:  Cigarettes    Quit date: 10/18/1992    Years since quitting: 30.7   Smokeless tobacco: Never  Vaping Use   Vaping status: Never Used  Substance and Sexual Activity   Alcohol use: No   Drug use: No   Sexual activity: Not on file  Other Topics Concern   Not on file  Social History Narrative   Not on file   Social Determinants of Health   Financial Resource Strain: Low Risk  (06/04/2022)   Overall Financial Resource Strain (CARDIA)    Difficulty of Paying Living Expenses: Not hard at all  Food Insecurity: No Food Insecurity (02/26/2023)   Hunger Vital Sign    Worried About Running Out of Food in the Last Year: Never true    Ran Out of Food in the Last Year: Never true  Transportation Needs: No Transportation Needs (02/26/2023)   PRAPARE - Administrator, Civil Service (Medical): No    Lack of Transportation (Non-Medical): No  Physical Activity: Not on file  Stress: Not on file  Social Connections: Not on file  Intimate Partner Violence: Not At Risk (02/26/2023)   Humiliation, Afraid, Rape, and Kick questionnaire    Fear of Current or Ex-Partner: No    Emotionally Abused: No    Physically Abused: No    Sexually Abused: No     Review of Systems    General:  No chills, fever, night sweats positive for weight gain.  Cardiovascular:  No chest pain, dyspnea on exertion, edema, orthopnea, palpitations, paroxysmal nocturnal dyspnea. Dermatological: No rash, lesions/masses Respiratory: No cough, dyspnea Urologic: No hematuria, dysuria Abdominal:   No nausea, vomiting, diarrhea, bright red blood per rectum, melena, or hematemesis Neurologic:  No visual changes, wkns, changes in mental status. All other systems reviewed and are otherwise negative except as noted above.  EKG Interpretation Date/Time:  Monday July 11 2023 09:31:09 EDT Ventricular Rate:  71 PR Interval:  190 QRS Duration:  84 QT Interval:  374 QTC Calculation: 406 R Axis:   -75  Text  Interpretation: Normal sinus rhythm Left axis deviation Minimal voltage criteria for LVH, may be normal variant ( R in aVL ) Inferior infarct , age undetermined Anterolateral infarct , age undetermined When compared with ECG of 25-Feb-2023 16:13, PREVIOUS ECG IS PRESENT Confirmed  by Joni Reining [phone]) on 07/11/2023 11:05:57 AM    Physical Exam    VS:  BP (!) 142/78 (BP Location: Left Arm, Patient Position: Sitting, Cuff Size: Normal)   Pulse 71   Ht 6\' 1"  (1.854 m)   Wt 224 lb (101.6 kg)   SpO2 90%   BMI 29.55 kg/m  , BMI Body mass index is 29.55 kg/m.     GEN: Well nourished, well developed, in no acute distress.  Morbidly obese HEENT: normal. Neck: Supple, no JVD, carotid bruits, or masses. Cardiac: RRR, bradycardic, 2/6 systolic murmur, , rubs, or gallops. No clubbing, cyanosis, edema.  Radials/DP/PT 2+ and equal bilaterally.  Respiratory:  Respirations regular and unlabored, scant inspiratory wheezes. GI: Soft, nontender, mild distended, BS + x 4. MS: no deformity or atrophy. Skin: warm and dry, no rash. Neuro:  Strength and sensation are intact. Psych: Normal affect.  EKG Interpretation Date/Time:  Monday July 11 2023 09:31:09 EDT Ventricular Rate:  71 PR Interval:  190 QRS Duration:  84 QT Interval:  374 QTC Calculation: 406 R Axis:   -75  Text Interpretation: Normal sinus rhythm Left axis deviation Minimal voltage criteria for LVH, may be normal variant ( R in aVL ) Inferior infarct , age undetermined Anterolateral infarct , age undetermined When compared with ECG of 25-Feb-2023 16:13, PREVIOUS ECG IS PRESENT Confirmed by Joni Reining [phone]) on 07/11/2023 11:05:57 AM   Lab Results  Component Value Date   WBC 10.4 02/28/2023   HGB 13.6 02/28/2023   HCT 41.7 02/28/2023   MCV 96.1 02/28/2023   PLT 294 02/28/2023   Lab Results  Component Value Date   CREATININE 1.69 (H) 04/29/2023   BUN 24 04/29/2023   NA 141 04/29/2023   K 5.6 (H) 04/29/2023   CL  104 04/29/2023   CO2 21 04/29/2023   Lab Results  Component Value Date   ALT 12 02/27/2023   AST 17 02/27/2023   ALKPHOS 38 02/27/2023   BILITOT 0.9 02/27/2023   Lab Results  Component Value Date   CHOL 121 04/29/2023   HDL 38 (L) 04/29/2023   LDLCALC 49 04/29/2023   TRIG 210 (H) 04/29/2023   CHOLHDL 3.2 04/29/2023    Lab Results  Component Value Date   HGBA1C 5.5 02/26/2023     Review of Prior Studies EKG Interpretation Date/Time:  Monday July 11 2023 09:31:09 EDT Ventricular Rate:  71 PR Interval:  190 QRS Duration:  84 QT Interval:  374 QTC Calculation: 406 R Axis:   -75  Text Interpretation: Normal sinus rhythm Left axis deviation Minimal voltage criteria for LVH, may be normal variant ( R in aVL ) Inferior infarct , age undetermined Anterolateral infarct , age undetermined When compared with ECG of 25-Feb-2023 16:13, PREVIOUS ECG IS PRESENT Confirmed by Joni Reining [phone]) on 07/11/2023 11:05:57 AM  CT angiogram of the aorta   1. No evidence of acute aortic syndrome. 2. 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 3. 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 Recommend annual imaging followup by CTA or MRA. This recommendation follows 2010 ACCF/AHA/AATS/ACR/ASA/SCA/SCAI/SIR/STS/SVM Guidelines for the Diagnosis and Management of Patients with Thoracic Aortic Disease. Circulation.2010; 121: B147-W295. Aortic aneurysm NOS (ICD10-I71.9) 4.  Coronary and aortic Atherosclerosis (ICD10-I70.0).   Cardiac Cath 06/04/2022     Ost RCA to Prox RCA lesion is 80% stenosed.  Heavily calcified.  Would require atherectomy to fix.   Prox RCA  lesion is 25% stenosed.   Mid RCA lesion is 70% stenosed.  Diffuse disease between the ostial lesion and this lesion.   3rd Mrg lesion is 75% stenosed.   1st Diag lesion is 50% stenosed.   Ost LAD to Mid LAD lesion is 25%  stenosed.   Mid LAD lesion is 90% stenosed.   A drug-eluting stent was successfully placed using a SYNERGY XD [id number], postdilated to greater than 3.5 mm.   Post intervention, there is a 0% residual stenosis.   The left ventricular systolic function is normal.   LV end diastolic pressure is normal.   The left ventricular ejection fraction is 50-55% by visual estimate.   There is no aortic valve stenosis.   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  There is moderate diffuse disease in the proximal LAD.  The culprit lesion was a severe stenosis in the mid LAD.  This was successfully treated with a 3.0 x 16 Synergy drug-eluting stent, postdilated to greater than 3.5 mm in diameter.    Plan discussed with Dr. Excell Zhang who is the primary cardiologist.  Would plan medical therapy for his residual disease. Diagnostic Dominance: Co-dominant  Intervention     Echocardiogram 06/03/2022   1. Left ventricular ejection fraction, by estimation, is 50 to 55%. The  left ventricle has low normal function. The left ventricle demonstrates  regional wall motion abnormalities (see scoring diagram/findings for  description). Left ventricular diastolic   parameters are consistent with Grade I diastolic dysfunction (impaired  relaxation). Elevated left ventricular end-diastolic pressure. The E/e' is  71. There is severe hypokinesis of the left ventricular, mid-apical  inferior wall, inferoseptal wall and  apical segment. The apex appears anerusymal.   2. The mitral valve is abnormal. Trivial mitral valve regurgitation.   3. The aortic valve is calcified. There is severe calcifcation of the  aortic valve. Moderate to severe aortic valve stenosis. Aortic valve area,  by VTI measures 0.48 cm. Aortic valve mean gradient measures 36.0 mmHg.  Aortic valve Vmax measures 3.57  m/s. Peak gradient 51 mmHg, DI 0.21.   4. There is normal pulmonary artery systolic pressure.   5. The inferior vena cava is normal in  size with greater than 50%  respiratory variability, suggesting right atrial pressure of 3 mmHg.   Assessment & Plan   1.  Severe aortic valve stenosis: Continues workup by Dr. Excell Zhang is due to see him next month for ongoing assessment and planning.  The patient is asymptomatic with exception of chronic shortness of breath.  2.  Chronic diastolic CHF: The patient has gained approximately 14 pounds since being seen last.  He states he has been eating more.  He states he does not add salt or eat salty foods.  I am not seeing any volume overload with exception of mild abdominal distention.  He is to continue Lasix 20 mg daily.  He should weigh himself daily.  If he gains 2 to 3 pounds in 24 hours he is to take an additional dose of Lasix.  3.  Coronary artery disease: Per cardiac catheterization June 09, 2022 had an 80% ostial RCA which was heavily calcified, 75% OM 3.  He is being treated medically with beta-blocker, lipid management, blood pressure control.  He is undergoing workup for aortic valve stenosis and possible TAVR.  Will likely have repeat cardiac catheterization if Dr. Excell Zhang is planning to move forward with this.  No longer on clopidogrel.  4.  Chronic COPD: Uses inhalers.  Continues to have chronic dyspnea on exertion and fatigue.  Followed by PCP.  He has a new primary care provider Dr. Lewie Chamber.  This is changed in his chart.  5.  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  He is on Pletal.  He continues peripheral neuropathy and pain.  Refills on gabapentin at 300 mg twice a day are provided as this was initially started by Dr. Royann Zhang.  6.  History of DVT/PE.  Remains on Coumadin therapy and is being followed at primary care in [place].  Aspirin has been discontinued.  7.  Hypercholesterolemia: Remains on atorvastatin 40 mg daily   Goal of LDL less than 70.  Most recent labs on 04/29/2023 total cholesterol  121 HDL 38 triglycerides 210 LDL 49.  8.  Hypertension: Blood pressure is elevated today.  He states that usually gets elevated when he comes to providers office.  Will continue to monitor this on follow-up appointments.  Currently he remains on metoprolol, losartan, and spironolactone.         Signed, Bettey Mare. Liborio Nixon, ANP, AACC   07/11/2023 11:10 AM      Office [phone] Fax [phone]  Notice: This dictation was prepared with Dragon dictation along with smaller phrase technology. Any transcriptional errors that result from this process are unintentional and may not be corrected upon review.

## 2023-07-11 ENCOUNTER — Ambulatory Visit: Payer: Medicare Other | Attending: Adult Health | Admitting: Adult Health

## 2023-07-11 ENCOUNTER — Encounter: Payer: Self-pay | Admitting: Adult Health

## 2023-07-11 ENCOUNTER — Other Ambulatory Visit: Payer: Self-pay | Admitting: Cardiovascular Disease

## 2023-07-11 VITALS — BP 142/78 | HR 71 | Ht 73.0 in | Wt 224.0 lb

## 2023-07-11 DIAGNOSIS — I5032 Chronic diastolic (congestive) heart failure: Secondary | ICD-10-CM

## 2023-07-11 DIAGNOSIS — E7800 Pure hypercholesterolemia, unspecified: Secondary | ICD-10-CM | POA: Diagnosis not present

## 2023-07-11 DIAGNOSIS — I10 Essential (primary) hypertension: Secondary | ICD-10-CM

## 2023-07-11 DIAGNOSIS — I714 Abdominal aortic aneurysm, without rupture, unspecified: Secondary | ICD-10-CM

## 2023-07-11 DIAGNOSIS — I350 Nonrheumatic aortic (valve) stenosis: Secondary | ICD-10-CM

## 2023-07-11 DIAGNOSIS — J441 Chronic obstructive pulmonary disease with (acute) exacerbation: Secondary | ICD-10-CM

## 2023-07-11 DIAGNOSIS — I2510 Atherosclerotic heart disease of native coronary artery without angina pectoris: Secondary | ICD-10-CM

## 2023-07-11 DIAGNOSIS — J869 Pyothorax without fistula: Secondary | ICD-10-CM

## 2023-07-11 MED ORDER — GABAPENTIN 300 MG PO CAPS: 300.0000 mg | ORAL_CAPSULE | Freq: Two times a day (BID) | ORAL | 2 refills | Status: DC

## 2023-07-11 NOTE — Patient Instructions (Signed)
Medication Instructions:  No Changes *If you need a refill on your cardiac medications before your next appointment, please call your pharmacy*   Lab Work: CMET, Lipid Panel. Prior to December appointment. If you have labs (blood work) drawn today and your tests are completely normal, you will receive your results only by: MyChart Message (if you have MyChart) OR A paper copy in the mail If you have any lab test that is abnormal or we need to change your treatment, we will call you to review the results.   Testing/Procedures: No Testing   Follow-Up: At [facility], you and your health needs are our priority.  As part of our continuing mission to provide you with exceptional heart care, we have created designated Provider Care Teams.  These Care Teams include your primary Cardiologist (physician) and Advanced Practice Providers (APPs -  Physician Assistants and Nurse Practitioners) who all work together to provide you with the care you need, when you need it.  We recommend signing up for the patient portal called "MyChart".  Sign up information is provided on this After Visit Summary.  MyChart is used to connect with patients for Virtual Visits (Telemedicine).  Patients are able to view lab/test results, encounter notes, upcoming appointments, etc.  Non-urgent messages can be sent to your provider as well.   To learn more about what you can do with MyChart, go to [email or web address].    Your next appointment:   Keep Scheduled Appointment  Provider:   Thurmon Fair, MD

## 2023-07-14 ENCOUNTER — Other Ambulatory Visit: Payer: Self-pay | Admitting: Cardiovascular Disease

## 2023-07-14 MED ORDER — ATORVASTATIN CALCIUM 40 MG PO TABS: 40.0000 mg | ORAL_TABLET | Freq: Every day | ORAL | 3 refills | Status: DC

## 2023-08-08 ENCOUNTER — Other Ambulatory Visit: Payer: Self-pay | Admitting: General Practice

## 2023-08-11 ENCOUNTER — Ambulatory Visit (HOSPITAL_COMMUNITY): Payer: Medicare Other

## 2023-08-16 ENCOUNTER — Ambulatory Visit (HOSPITAL_COMMUNITY)
Admission: RE | Admit: 2023-08-16 | Discharge: 2023-08-16 | Disposition: A | Discharge status: Home / Self Care | Payer: Medicare Other | Source: Ambulatory Visit | Attending: Cardiovascular Disease | Admitting: Cardiovascular Disease

## 2023-08-16 DIAGNOSIS — I714 Abdominal aortic aneurysm, without rupture, unspecified: Secondary | ICD-10-CM | POA: Diagnosis not present

## 2023-08-16 DIAGNOSIS — I509 Heart failure, unspecified: Secondary | ICD-10-CM | POA: Diagnosis not present

## 2023-08-16 DIAGNOSIS — I352 Nonrheumatic aortic (valve) stenosis with insufficiency: Secondary | ICD-10-CM | POA: Insufficient documentation

## 2023-08-16 DIAGNOSIS — I7122 Aneurysm of the aortic arch, without rupture: Secondary | ICD-10-CM | POA: Diagnosis not present

## 2023-08-16 DIAGNOSIS — G4730 Sleep apnea, unspecified: Secondary | ICD-10-CM | POA: Insufficient documentation

## 2023-08-16 DIAGNOSIS — I110 Hypertensive heart disease with heart failure: Secondary | ICD-10-CM | POA: Insufficient documentation

## 2023-08-16 DIAGNOSIS — I252 Old myocardial infarction: Secondary | ICD-10-CM | POA: Diagnosis not present

## 2023-08-16 DIAGNOSIS — J449 Chronic obstructive pulmonary disease, unspecified: Secondary | ICD-10-CM | POA: Insufficient documentation

## 2023-08-16 DIAGNOSIS — E785 Hyperlipidemia, unspecified: Secondary | ICD-10-CM | POA: Insufficient documentation

## 2023-08-16 DIAGNOSIS — I350 Nonrheumatic aortic (valve) stenosis: Secondary | ICD-10-CM | POA: Diagnosis present

## 2023-08-16 LAB — ECHOCARDIOGRAM COMPLETE
AR max vel: 0.84 cm2
AV Area VTI: 0.67 cm2
AV Area mean vel: 0.72 cm2
AV Mean grad: 31.8 mm[Hg]
AV Peak grad: 49.9 mm[Hg]
Ao pk vel: 3.53 m/s
Area-P 1/2: 3.42 cm2
Calc EF: 60.5 %
P 1/2 time: 549 ms
S' Lateral: 3 cm
Single Plane A2C EF: 62.9 %
Single Plane A4C EF: 60.1 %

## 2023-08-25 ENCOUNTER — Other Ambulatory Visit: Payer: Self-pay | Admitting: *Deleted

## 2023-08-25 ENCOUNTER — Encounter: Payer: Self-pay | Admitting: Cardiovascular Disease

## 2023-08-25 ENCOUNTER — Ambulatory Visit: Payer: Medicare Other | Attending: Cardiovascular Disease | Admitting: Cardiovascular Disease

## 2023-08-25 VITALS — BP 140/86 | HR 72 | Ht 73.0 in | Wt 230.4 lb

## 2023-08-25 DIAGNOSIS — I350 Nonrheumatic aortic (valve) stenosis: Secondary | ICD-10-CM

## 2023-08-25 DIAGNOSIS — N1832 Chronic kidney disease, stage 3b: Secondary | ICD-10-CM

## 2023-08-25 DIAGNOSIS — N179 Acute kidney failure, unspecified: Secondary | ICD-10-CM

## 2023-08-25 MED ORDER — NITROGLYCERIN 0.3 MG SL SUBL: SUBLINGUAL_TABLET | SUBLINGUAL | 3 refills | Status: DC

## 2023-08-25 MED ORDER — ATORVASTATIN CALCIUM 40 MG PO TABS: 40.0000 mg | ORAL_TABLET | Freq: Every day | ORAL | 3 refills | Status: DC

## 2023-08-25 MED ORDER — EMPAGLIFLOZIN 10 MG PO TABS: 10.0000 mg | ORAL_TABLET | Freq: Every day | ORAL | 3 refills | Status: DC

## 2023-08-25 MED ORDER — METOPROLOL SUCCINATE ER 100 MG PO TB24: 100.0000 mg | ORAL_TABLET | Freq: Every day | ORAL | 3 refills | Status: DC

## 2023-08-25 MED ORDER — FUROSEMIDE 20 MG PO TABS: 20.0000 mg | ORAL_TABLET | Freq: Every day | ORAL | 3 refills | Status: DC

## 2023-08-25 MED ORDER — LOSARTAN POTASSIUM 50 MG PO TABS: 50.0000 mg | ORAL_TABLET | Freq: Every day | ORAL | 3 refills | Status: DC

## 2023-08-25 MED ORDER — SPIRONOLACTONE 25 MG PO TABS
12.5000 mg | ORAL_TABLET | Freq: Every day | ORAL | 3 refills | Status: DC
Start: 2023-08-25 — End: 2023-09-28

## 2023-08-25 NOTE — Patient Instructions (Addendum)
Medication Instructions:  Your physician recommends that you continue on your current medications as directed. Please refer to the Current Medication list given to you today.  *If you need a refill on your cardiac medications before your next appointment, please call your pharmacy*   Lab Work: None today, you will have to come back for labs   If you have labs (blood work) drawn today and your tests are completely normal, you will receive your results only by: MyChart Message (if you have MyChart) OR A paper copy in the mail If you have any lab test that is abnormal or we need to change your treatment, we will call you to review the results.   Testing/Procedures: Your physician has requested that you have a cardiac catheterization. Cardiac catheterization is used to diagnose and/or treat various heart conditions. Doctors may recommend this procedure for a number of different reasons. The most common reason is to evaluate chest pain. Chest pain can be a symptom of coronary artery disease (CAD), and cardiac catheterization can show whether plaque is narrowing or blocking your heart's arteries. This procedure is also used to evaluate the valves, as well as measure the blood flow and oxygen levels in different parts of your heart. For further information please visit [email or web address] Please follow instruction sheet, as given.         Cardiac/Peripheral Catheterization   You are scheduled for a Cardiac Catheterization on Thursday, December 5 with Dr. Tonny Bollman.  1. Please arrive at the [facility] (Main Entrance A) at [facility]: [address] [place], [place] [address] at 7:30 AM (This time is 2 hour(s) before your procedure to ensure your preparation). Free valet parking service is available. You will check in at ADMITTING. The support person will be asked to wait in the waiting room.  It is OK to have someone drop you off and come back when you are ready to be discharged.         Special note: Every effort is made to have your procedure done on time. Please understand that emergencies sometimes delay scheduled procedures.  2. Diet: Do not eat solid foods after midnight.  You may have clear liquids until 5 AM the day of the procedure.  3. Labs: You will need to have blood drawn on  09/19/23, come to the office, after 7:30 AM, BEFORE 12:30 FOR BMET, CBC, & PT/INR December 2 at [facility] at [facility]. [address], [place]  Open: 7:30am - 5pm    Phone: [phone]. You do not need to be fasting.  4. Medication instructions in preparation for your procedure:   Contrast Allergy: No    Stop taking Coumadin (Warfarin) on Saturday, November 30. WILL BE YOUR LAST DOSE UNTIL AFTER PROCEDURE  Stop taking, Cozaar (Losartan) &  Lasix (Furosemide)  Tuesday, December 3 WILL BE YOUR LAST DOSE UNTIL AFTER PROCEDURE   On the morning of your procedure, take Aspirin 81 mg and any morning medicines NOT listed above.  You may use sips of water.  5. Plan to go home the same day, you will only stay overnight if medically necessary. 6. You MUST have a responsible adult to drive you home. 7. An adult MUST be with you the first 24 hours after you arrive home. 8. Bring a current list of your medications, and the last time and date medication taken. 9. Bring ID and current insurance cards. 10.Please wear clothes that are easy to get on  and off and wear slip-on shoes.  Thank you for allowing [place] to care for you!   -- [facility]    Follow-Up: At [facility], you and your health needs are our priority.  As part of our continuing mission to provide you with exceptional heart care, we have created designated Provider Care Teams.  These Care Teams include your primary Cardiologist (physician) and Advanced Practice Providers (APPs -  Physician Assistants and Nurse Practitioners) who all work together to provide  you with the care you need, when you need it.  We recommend signing up for the patient portal called "MyChart".  Sign up information is provided on this After Visit Summary.  MyChart is used to connect with patients for Virtual Visits (Telemedicine).  Patients are able to view lab/test results, encounter notes, upcoming appointments, etc.  Non-urgent messages can be sent to your provider as well.   To learn more about what you can do with MyChart, go to [email or web address].    Your next appointment:   You will be contacted  Provider:     Other Instructions Dr. Earmon Phoenix team will be in touch with you regarding the other testing that needs to be done.

## 2023-08-25 NOTE — Progress Notes (Addendum)
Pre Surgical Assessment: 5 M Walk Test  71M=16.71ft  5 Meter Walk Test- trial 1: 9.54 seconds 5 Meter Walk Test- trial 2: 7.85 seconds 5 Meter Walk Test- trial 3: 7.65 seconds 5 Meter Walk Test Average: 8.35 seconds  _________________________  STS score  Procedure Type: Isolated AVR Perioperative Outcome Estimate % Operative Mortality 4.04% Morbidity & Mortality 21.3% Stroke 2.35% Renal Failure 5.16% Reoperation 4.09% Prolonged Ventilation 9.38% Deep Sternal Wound Infection 0.162% Long Hospital Stay (>14 days) 6.61% Short Hospital Stay (<6 days)* 24%

## 2023-08-25 NOTE — Progress Notes (Signed)
Cardiology Office Note:    Date:  08/25/2023   ID:  [name], DOB [birth year], MRN [id number]  PCP:  [name], MD   [facility] Providers Cardiologist:  Thurmon Fair, MD     Referring MD: [name], MD   Chief Complaint  Patient presents with   Follow-up    Aortic stenosis    History of Present Illness:    Tony Zhang is a 77 y.o. male presenting for follow-up of severe aortic stenosis.  I saw him initially in May 2024.  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  He also has a history of stage IIIb chronic kidney disease and COPD.  He was diagnosed with severe paradoxical low-flow low gradient aortic stenosis.  Due to his comorbidities and clinical stability, he elected for continued observation rather than proceeding with TAVR evaluation when I initially saw him.  A recent echocardiogram for follow-up showed stable LVEF of 60 to 65%, normal RV function, no mitral valve disease, and worsening of his low-flow low gradient aortic stenosis with a mean gradient of 32 mmHg, V-max of 3.5 m/s, stroke-volume index of 28, dimensionless index of 0.18, and aortic valve area of 0.67 cm.  The patient is here alone today.  He remains short of breath with activity but reports only slow progression of his shortness of breath over the past year.  He is not very active in light of his comorbid medical problems.  Neuropathy seems to be a major limitation for him.  He does some work at a [employer] on the weekends and with this level of physical activity he is short of breath.  He denies orthopnea, PND, or chest pain. He has no lightheadedness or syncope.  Current Medications: Current Meds  Medication Sig   albuterol (VENTOLIN HFA) 108 (90 Base) MCG/ACT inhaler Inhale 2 puffs into the lungs in the morning and at bedtime.   allopurinol  (ZYLOPRIM) 300 MG tablet Take 300 mg by mouth at bedtime.   budesonide-formoterol (SYMBICORT) 160-4.5 MCG/ACT inhaler Inhale 2 puffs into the lungs in the morning and at bedtime.   Choline Fenofibrate (FENOFIBRIC ACID) 135 MG CPDR Take 135 mg by mouth daily.   cilostazol (PLETAL) 100 MG tablet Take 100 mg by mouth daily.   cyanocobalamin 1000 MCG tablet Take 1 tablet (1,000 mcg total) by mouth daily.   Evolocumab (REPATHA SURECLICK) 140 MG/ML SOAJ Inject 140 mg into the skin every 14 (fourteen) days.   gabapentin (NEURONTIN) 300 MG capsule Take 1 capsule (300 mg total) by mouth 2 (two) times daily.   ondansetron (ZOFRAN) 8 MG tablet Take 8 mg by mouth 2 (two) times daily as needed for vomiting or nausea.   pyridOXINE (B-6) 50 MG tablet Take 1 tablet (50 mg total) by mouth daily.   tamsulosin (FLOMAX) 0.4 MG CAPS capsule Take 0.4 mg by mouth every evening.   warfarin (COUMADIN) 2 MG tablet Take 2-4 mg by mouth See admin instructions. Take 2 tablets (4mg ) by mouth Saturday and Sunday, then take 1 tablet (2mg ) all other days   [DISCONTINUED] atorvastatin (LIPITOR) 40 MG tablet Take 1 tablet (40 mg total) by mouth daily.   [DISCONTINUED] empagliflozin (JARDIANCE) 10 MG TABS tablet Take 1 tablet (10 mg total) by mouth daily.   [DISCONTINUED] furosemide (LASIX) 20 MG tablet TAKE 1 TABLET BY MOUTH DAILY   [DISCONTINUED] losartan (COZAAR) 50 MG  tablet Take 1 tablet (50 mg total) by mouth daily.   [DISCONTINUED] metoprolol succinate (TOPROL-XL) 100 MG 24 hr tablet Take 100 mg by mouth daily.    [DISCONTINUED] nitroGLYCERIN (NITROSTAT) 0.3 MG SL tablet Place under tongue. May take every 5 minutes up to 3 doses for chest pain. If still having pain after 3 doses, call 911.   [DISCONTINUED] spironolactone (ALDACTONE) 25 MG tablet Take 0.5 tablets (12.5 mg total) by mouth daily.     Allergies:   Codeine   ROS:   Please see the history of present illness.    All other systems reviewed and are  negative.  EKGs/Labs/Other Studies Reviewed:    The following studies were reviewed today: Cardiac Studies & Procedures   CARDIAC CATHETERIZATION  CARDIAC CATHETERIZATION 06/04/2022  Narrative   Ost RCA to Prox RCA lesion is 80% stenosed.  Heavily calcified.  Would require atherectomy to fix.   Prox RCA lesion is 25% stenosed.   Mid RCA lesion is 70% stenosed.  Diffuse disease between the ostial lesion and this lesion.   3rd Mrg lesion is 75% stenosed.   1st Diag lesion is 50% stenosed.   Ost LAD to Mid LAD lesion is 25% stenosed.   Mid LAD lesion is 90% stenosed.   A drug-eluting stent was successfully placed using a SYNERGY XD 3.0X16, postdilated to greater than 3.5 mm.   Post intervention, there is a 0% residual stenosis.   The left ventricular systolic function is normal.   LV end diastolic pressure is normal.   The left ventricular ejection fraction is 50-55% by visual estimate.   There is no aortic valve stenosis.  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  There is moderate diffuse disease in the proximal LAD.  The culprit lesion was a severe stenosis in the mid LAD.  This was successfully treated with a 3.0 x 16 Synergy drug-eluting stent, postdilated to greater than 3.5 mm in diameter.  Resume IV heparin 2 hours after the TR band is removed.  Coumadin can also be resumed tonight.  Target INR greater than 2.  Aspirin will be used for 1 month and then stop.  Clopidogrel will be continued for ideally 12 months if no bleeding problems.  Plan discussed with Dr. Excell Seltzer who is the primary cardiologist.  Would plan medical therapy for his residual disease.  Findings Coronary Findings Diagnostic  Dominance: Co-dominant  Left Anterior Descending Ost LAD to Mid LAD lesion is 25% stenosed. Mid LAD lesion is 90% stenosed. The lesion is eccentric and ulcerative.  First Diagonal Branch Vessel is small in size. 1st Diag lesion is 50% stenosed.  Left Circumflex The vessel exhibits  minimal luminal irregularities.  Third Obtuse Marginal Branch Vessel is large in size. 3rd Mrg lesion is 75% stenosed.  Right Coronary Artery Ost RCA to Prox RCA lesion is 80% stenosed. The lesion is severely calcified. Prox RCA lesion is 25% stenosed. Mid RCA lesion is 70% stenosed.  Intervention  Mid LAD lesion Stent CATH LAUNCHER 6FR EBU3.5 guide catheter was inserted. Lesion crossed with guidewire using a WIRE ASAHI PROWATER 180CM. Pre-stent angioplasty was performed using a BALLN SAPPHIRE 2.5X12. A drug-eluting stent was successfully placed using a SYNERGY XD 3.0X16. Stent strut is well apposed. Post-stent angioplasty was performed using a BALL SAPPHIRE NC24 3.5X12. Post-Intervention Lesion Assessment The intervention was successful. Pre-interventional TIMI flow is 3. Post-intervention TIMI flow is 3. No complications occurred at this lesion. There is a 0% residual stenosis post intervention.  ECHOCARDIOGRAM  ECHOCARDIOGRAM COMPLETE 08/16/2023  Narrative ECHOCARDIOGRAM REPORT    Patient Name:   Tony Zhang Date of Exam: 08/16/2023 Medical Rec #:  [id number]     Height:       73.0 in Accession #:    [id number]    Weight:       224.0 lb Date of Birth:  25-Aug-1946     BSA:          2.258 m Patient Age:    77 years      BP:           142/78 mmHg Patient Gender: M             HR:           67 bpm. Exam Location:  Outpatient  Procedure: 2D Echo, Cardiac Doppler and Color Doppler  MODIFIED REPORT: This report was modified by Thomasene Ripple DO on 08/16/2023 due to aortic dilataion. Indications:     Aortic stenosis I35.0  History:         Patient has prior history of Echocardiogram examinations, most recent 02/28/2023. CHF, Previous Myocardial Infarction, COPD, Aortic Valve Disease, Signs/Symptoms:Shortness of Breath; Risk Factors:Sleep Apnea, Hypertension and Dyslipidemia. Abdominal Aortic Aneurysm, Aortic Arch Aneurysm.  Sonographer:     Eulah Pont RDCS Referring  Phys:  [phone] Aundreya Souffrant Diagnosing Phys: Lavona Mound Tobb DO  IMPRESSIONS   1. The aortic valve is thickened and moderately calcified. Aortic valve regurgitation is mild. Paradoxical Severe Low flow low gradient aortic stenosis. Aortic regurgitation PHT measures 549 msec. Aortic valve area, by VTI measures 0.67 cm. Aortic valve mean gradient measures 31.8 mmHg. Aortic valve Vmax 3.53 m/s, SVI 28, DI 0.18, Indexed AVA 0.29. 2. Left ventricular ejection fraction, by estimation, is 60 to 65%. The left ventricle has normal function. The left ventricle has no regional wall motion abnormalities. There is mild concentric left ventricular hypertrophy. Left ventricular diastolic parameters are indeterminate. 3. Right ventricular systolic function is normal. The right ventricular size is normal. 4. The mitral valve is degenerative. No evidence of mitral valve regurgitation. No evidence of mitral stenosis. 5. The inferior vena cava is normal in size with greater than 50% respiratory variability, suggesting right atrial pressure of 3 mmHg. 6. There is mild dilatation of the ascending aorta, measuring 40 mm.  FINDINGS Left Ventricle: Left ventricular ejection fraction, by estimation, is 60 to 65%. The left ventricle has normal function. The left ventricle has no regional wall motion abnormalities. The left ventricular internal cavity size was normal in size. There is mild concentric left ventricular hypertrophy. Left ventricular diastolic parameters are indeterminate.  Right Ventricle: The right ventricular size is normal. No increase in right ventricular wall thickness. Right ventricular systolic function is normal.  Left Atrium: Left atrial size was normal in size.  Right Atrium: Right atrial size was normal in size.  Pericardium: There is no evidence of pericardial effusion. Presence of epicardial fat layer.  Mitral Valve: The mitral valve is degenerative in appearance. Mild to moderate mitral  annular calcification. No evidence of mitral valve regurgitation. No evidence of mitral valve stenosis.  Tricuspid Valve: The tricuspid valve is normal in structure. Tricuspid valve regurgitation is not demonstrated. No evidence of tricuspid stenosis.  Aortic Valve: The aortic valve is calcified. There is moderate calcification of the aortic valve. There is moderate thickening of the aortic valve. Aortic valve regurgitation is mild. Aortic regurgitation PHT measures 549 msec. Parodoxical Severe Low flow low gradient aortic stenosis. Aortic valve  mean gradient measures 31.8 mmHg. Aortic valve peak gradient measures 49.9 mmHg. Aortic valve area, by VTI measures 0.67 cm.  Pulmonic Valve: The pulmonic valve was normal in structure. Pulmonic valve regurgitation is not visualized. No evidence of pulmonic stenosis.  Aorta: There is mild dilatation of the ascending aorta, measuring 40 mm.  Venous: The inferior vena cava is normal in size with greater than 50% respiratory variability, suggesting right atrial pressure of 3 mmHg.  IAS/Shunts: No atrial level shunt detected by color flow Doppler.   LEFT VENTRICLE PLAX 2D LVIDd:         5.05 cm     Diastology LVIDs:         3.00 cm     LV e' medial:    5.35 cm/s LV PW:         1.05 cm     LV E/e' medial:  23.4 LV IVS:        1.05 cm     LV e' lateral:   6.49 cm/s LVOT diam:     2.20 cm     LV E/e' lateral: 19.3 LV SV:         63 LV SV Index:   28 LVOT Area:     3.80 cm  LV Volumes (MOD) LV vol d, MOD A2C: 87.5 ml LV vol d, MOD A4C: 96.5 ml LV vol s, MOD A2C: 32.5 ml LV vol s, MOD A4C: 38.5 ml LV SV MOD A2C:     55.0 ml LV SV MOD A4C:     96.5 ml LV SV MOD BP:      55.8 ml  RIGHT VENTRICLE RV S prime:     10.60 cm/s TAPSE (M-mode): 1.9 cm  LEFT ATRIUM             Index        RIGHT ATRIUM           Index LA diam:        5.10 cm 2.26 cm/m   RA Area:     14.80 cm LA Vol (A2C):   51.2 ml 22.68 ml/m  RA Volume:   34.80 ml  15.41  ml/m LA Vol (A4C):   58.1 ml 25.73 ml/m LA Biplane Vol: 56.7 ml 25.11 ml/m AORTIC VALVE AV Area (Vmax):    0.84 cm AV Area (Vmean):   0.72 cm AV Area (VTI):     0.67 cm AV Vmax:           353.25 cm/s AV Vmean:          268.750 cm/s AV VTI:            0.954 m AV Peak Grad:      49.9 mmHg AV Mean Grad:      31.8 mmHg LVOT Vmax:         78.10 cm/s LVOT Vmean:        50.700 cm/s LVOT VTI:          0.167 m LVOT/AV VTI ratio: 0.18 AI PHT:            549 msec  AORTA Ao Root diam: 3.20 cm Ao Asc diam:  3.95 cm  MITRAL VALVE MV Area (PHT): 3.42 cm     SHUNTS MV Decel Time: 222 msec     Systemic VTI:  0.17 m MV E velocity: 125.00 cm/s  Systemic Diam: 2.20 cm MV A velocity: 118.00 cm/s MV E/A ratio:  1.06  Kardie Tobb DO Electronically signed  by Thomasene Ripple DO Signature Date/Time: 08/16/2023/11:41:14 AM    Final (Updated)             EKG:   EKG Interpretation Date/Time:  Thursday August 25 2023 11:23:02 EST Ventricular Rate:  73 PR Interval:  188 QRS Duration:  86 QT Interval:  360 QTC Calculation: 396 R Axis:   -75  Text Interpretation: Normal sinus rhythm Left axis deviation Left ventricular hypertrophy with repolarization abnormality ( R in aVL ) Inferior infarct (cited on or before 11-Jul-2023) Anteroseptal infarct (cited on or before 11-Jul-2023) When compared with ECG of 11-Jul-2023 09:31, Questionable change in initial forces of Septal leads Confirmed by Tonny Bollman [id number]) on 08/25/2023 11:40:12 AM    Recent Labs: 09/08/2022: NT-Pro BNP 432 02/25/2023: B Natriuretic Peptide 370.4 02/27/2023: ALT 12; TSH 0.531 02/28/2023: Hemoglobin 13.6; Magnesium 1.9; Platelets 294 04/29/2023: BUN 24; Creatinine, Ser 1.69; Potassium 5.6; Sodium 141  Recent Lipid Panel    Component Value Date/Time   CHOL 121 04/29/2023 0842   TRIG 210 (H) 04/29/2023 0842   HDL 38 (L) 04/29/2023 0842   CHOLHDL 3.2 04/29/2023 0842   CHOLHDL 3.9 06/03/2022 0257   VLDL 19 06/03/2022  0257   LDLCALC 49 04/29/2023 0842           Physical Exam:    VS:  BP (!) 140/86   Pulse 72   Ht 6\' 1"  (1.854 m)   Wt 230 lb 6.4 oz (104.5 kg)   SpO2 94%   BMI 30.40 kg/m     Wt Readings from Last 3 Encounters:  08/25/23 230 lb 6.4 oz (104.5 kg)  07/11/23 224 lb (101.6 kg)  04/29/23 210 lb (95.3 kg)     GEN:  Well nourished, well developed, elderly male in no acute distress HEENT: Normal NECK: No JVD; No carotid bruits LYMPHATICS: No lymphadenopathy CARDIAC: RRR, 3/6 harsh crescendo decrescendo murmur at the right upper sternal border RESPIRATORY:  Clear to auscultation without rales, wheezing or rhonchi  ABDOMEN: Soft, non-tender, non-distended MUSCULOSKELETAL:  No edema; No deformity  SKIN: Warm and dry NEUROLOGIC:  Alert and oriented x 3 PSYCHIATRIC:  Normal affect   Assessment & Plan Aortic valve stenosis, nonrheumatic The patient has severe paradoxical low-flow low gradient aortic stenosis with NYHA functional class II symptoms of fatigue and exertional dyspnea.  He has multiple comorbid medical conditions outlined above.  His aortic stenosis has progressed by serial echo studies.  His aortic valve is severely calcified and restricted on 2D imaging.  Doppler assessment meets all criteria for paradoxical low-flow low gradient aortic stenosis with a mean gradient of 32 mmHg, stroke-volume index of 28, dimensionless index of 0.18, and aortic valve area of 0.67.  He does not have concomitant valvular disease affecting the mitral or tricuspid valves.  We reviewed the natural history of aortic stenosis today.  We discussed potential treatment options in light of his comorbidities.  I do not think he would be a candidate for conventional heart surgery, but will defer to formal cardiac surgical consultation once he undergoes appropriate preoperative testing with a cardiac catheterization and CTA studies of the heart as well as the chest, abdomen, and pelvis. I have reviewed the  risks, indications, and alternatives to cardiac catheterization, possible angioplasty, and stenting with the patient. Risks include but are not limited to bleeding, infection, vascular injury, stroke, myocardial infection, arrhythmia, kidney injury, radiation-related injury in the case of prolonged fluoroscopy use, emergency cardiac surgery, and death. The patient understands the risks of serious  complication is 1-2 in 1000 with diagnostic cardiac cath and 1-2% or less with angioplasty/stenting.  Will consider left radial access as his left radial pulses stronger than that on the right.  His distal aorta is occluded so transfemoral access is not an option.  His cardiac catheterization and CTA studies will have to be staged in light of his chronic kidney disease.  I looked over his CTA from 1 year ago and I think both his left carotid and left subclavian artery are suitable for insertion of a transcatheter aortic valve.  There is some calcification in the proximal portion of the left subclavian artery that may be borderline and we will better assess that on his upcoming CTA scan.  The patient will have to hold warfarin before his catheterization.  For a short period of anticoagulation interruption, I do not think he requires anticoagulation bridging. Stage 3b chronic kidney disease (HCC) Check labs, stage contrast studies.        Informed Consent   Shared Decision Making/Informed Consent The risks [stroke (1 in 1000), death (1 in 1000), kidney failure [usually temporary] (1 in 500), bleeding (1 in 200), allergic reaction [possibly serious] (1 in 200)], benefits (diagnostic support and management of coronary artery disease) and alternatives of a cardiac catheterization were discussed in detail with Mr. Cattell and he is willing to proceed.       Medication Adjustments/Labs and Tests Ordered: Current medicines are reviewed at length with the patient today.  Concerns regarding medicines are outlined above.   Orders Placed This Encounter  Procedures   Basic Metabolic Panel (BMET)   CBC   Protime-INR   EKG 12-Lead   Meds ordered this encounter  Medications   atorvastatin (LIPITOR) 40 MG tablet    Sig: Take 1 tablet (40 mg total) by mouth daily.    Dispense:  90 tablet    Refill:  3   empagliflozin (JARDIANCE) 10 MG TABS tablet    Sig: Take 1 tablet (10 mg total) by mouth daily.    Dispense:  90 tablet    Refill:  3   furosemide (LASIX) 20 MG tablet    Sig: Take 1 tablet (20 mg total) by mouth daily.    Dispense:  90 tablet    Refill:  3   losartan (COZAAR) 50 MG tablet    Sig: Take 1 tablet (50 mg total) by mouth daily.    Dispense:  90 tablet    Refill:  3   metoprolol succinate (TOPROL-XL) 100 MG 24 hr tablet    Sig: Take 1 tablet (100 mg total) by mouth daily.    Dispense:  90 tablet    Refill:  3   nitroGLYCERIN (NITROSTAT) 0.3 MG SL tablet    Sig: Place under tongue. May take every 5 minutes up to 3 doses for chest pain. If still having pain after 3 doses, call 911.    Dispense:  30 tablet    Refill:  3   spironolactone (ALDACTONE) 25 MG tablet    Sig: Take 0.5 tablets (12.5 mg total) by mouth daily.    Dispense:  90 tablet    Refill:  3    Please cancel any previous dosages.    Patient Instructions  Medication Instructions:  Your physician recommends that you continue on your current medications as directed. Please refer to the Current Medication list given to you today.  *If you need a refill on your cardiac medications before your next appointment, please call your  pharmacy*   Lab Work: None today, you will have to come back for labs   If you have labs (blood work) drawn today and your tests are completely normal, you will receive your results only by: MyChart Message (if you have MyChart) OR A paper copy in the mail If you have any lab test that is abnormal or we need to change your treatment, we will call you to review the  results.   Testing/Procedures: Your physician has requested that you have a cardiac catheterization. Cardiac catheterization is used to diagnose and/or treat various heart conditions. Doctors may recommend this procedure for a number of different reasons. The most common reason is to evaluate chest pain. Chest pain can be a symptom of coronary artery disease (CAD), and cardiac catheterization can show whether plaque is narrowing or blocking your heart's arteries. This procedure is also used to evaluate the valves, as well as measure the blood flow and oxygen levels in different parts of your heart. For further information please visit [email or web address] Please follow instruction sheet, as given.         Cardiac/Peripheral Catheterization   You are scheduled for a Cardiac Catheterization on Thursday, December 5 with Dr. Tonny Bollman.  1. Please arrive at the [facility] (Main Entrance A) at [facility]: [address] [place], [place] [address] at 7:30 AM (This time is 2 hour(s) before your procedure to ensure your preparation). Free valet parking service is available. You will check in at ADMITTING. The support person will be asked to wait in the waiting room.  It is OK to have someone drop you off and come back when you are ready to be discharged.        Special note: Every effort is made to have your procedure done on time. Please understand that emergencies sometimes delay scheduled procedures.  2. Diet: Do not eat solid foods after midnight.  You may have clear liquids until 5 AM the day of the procedure.  3. Labs: You will need to have blood drawn on  09/19/23, come to the office, after 7:30 AM, BEFORE 12:30 FOR BMET, CBC, & PT/INR December 2 at [facility] at [facility]. [address], [place]  Open: 7:30am - 5pm    Phone: [phone]. You do not need to be fasting.  4. Medication instructions in preparation for your procedure:   Contrast  Allergy: No    Stop taking Coumadin (Warfarin) on Saturday, November 30. WILL BE YOUR LAST DOSE UNTIL AFTER PROCEDURE  Stop taking, Cozaar (Losartan) &  Lasix (Furosemide)  Tuesday, December 3 WILL BE YOUR LAST DOSE UNTIL AFTER PROCEDURE   On the morning of your procedure, take Aspirin 81 mg and any morning medicines NOT listed above.  You may use sips of water.  5. Plan to go home the same day, you will only stay overnight if medically necessary. 6. You MUST have a responsible adult to drive you home. 7. An adult MUST be with you the first 24 hours after you arrive home. 8. Bring a current list of your medications, and the last time and date medication taken. 9. Bring ID and current insurance cards. 10.Please wear clothes that are easy to get on and off and wear slip-on shoes.  Thank you for allowing [place] to care for you!   -- [facility]    Follow-Up: At [facility], you and your health needs are our priority.  As  part of our continuing mission to provide you with exceptional heart care, we have created designated Provider Care Teams.  These Care Teams include your primary Cardiologist (physician) and Advanced Practice Providers (APPs -  Physician Assistants and Nurse Practitioners) who all work together to provide you with the care you need, when you need it.  We recommend signing up for the patient portal called "MyChart".  Sign up information is provided on this After Visit Summary.  MyChart is used to connect with patients for Virtual Visits (Telemedicine).  Patients are able to view lab/test results, encounter notes, upcoming appointments, etc.  Non-urgent messages can be sent to your provider as well.   To learn more about what you can do with MyChart, go to [email or web address].    Your next appointment:   You will be contacted  Provider:     Other Instructions Dr. Earmon Phoenix team will be in touch with you regarding the other  testing that needs to be done.      Signed, Tonny Bollman, MD  08/25/2023 1:07 PM    [facility]

## 2023-08-25 NOTE — Assessment & Plan Note (Signed)
Check labs, stage contrast studies.

## 2023-09-05 ENCOUNTER — Other Ambulatory Visit: Payer: Self-pay | Admitting: Cardiovascular Disease

## 2023-09-19 ENCOUNTER — Telehealth: Payer: Self-pay | Admitting: Cardiovascular Disease

## 2023-09-19 DIAGNOSIS — Z01812 Encounter for preprocedural laboratory examination: Secondary | ICD-10-CM

## 2023-09-19 DIAGNOSIS — Z79899 Other long term (current) drug therapy: Secondary | ICD-10-CM

## 2023-09-19 LAB — CBC
Hematocrit: 50.5 % (ref 37.5–51.0)
Hemoglobin: 17 g/dL (ref 13.0–17.7)
MCH: 31.1 pg (ref 26.6–33.0)
MCHC: 33.7 g/dL (ref 31.5–35.7)
MCV: 92 fL (ref 79–97)
Platelets: 212 10*3/uL (ref 150–450)
RBC: 5.47 x10E6/uL (ref 4.14–5.80)
RDW: 16.3 % — ABNORMAL HIGH (ref 11.6–15.4)
WBC: 6.5 10*3/uL (ref 3.4–10.8)

## 2023-09-19 LAB — BASIC METABOLIC PANEL
BUN/Creatinine Ratio: 18 (ref 10–24)
BUN: 43 mg/dL — ABNORMAL HIGH (ref 8–27)
CO2: 22 mmol/L (ref 20–29)
Calcium: 9.8 mg/dL (ref 8.6–10.2)
Chloride: 102 mmol/L (ref 96–106)
Creatinine, Ser: 2.37 mg/dL — ABNORMAL HIGH (ref 0.76–1.27)
Glucose: 95 mg/dL (ref 70–99)
Potassium: 5.9 mmol/L (ref 3.5–5.2)
Sodium: 137 mmol/L (ref 134–144)
eGFR: 27 mL/min/{1.73_m2} — ABNORMAL LOW (ref >59)

## 2023-09-19 LAB — PROTIME-INR
INR: 2.3 — ABNORMAL HIGH (ref 0.9–1.2)
Prothrombin Time: 24.2 s — ABNORMAL HIGH (ref 9.1–12.0)

## 2023-09-19 NOTE — Telephone Encounter (Signed)
Thank you Nur Krasinski 

## 2023-09-19 NOTE — Telephone Encounter (Signed)
Stat lab call from [place] at [facility], patient w/ K+ of 5.9. On further review, patient also with Cr 2.37, up from 1.69 on 04/29/23. Reviewed with Dr. Excell Seltzer who advises patient hold spironolactone and losartan and repeat BMET on 09/22/23. Call to patient who verbalizes understanding and agrees to plan. Orders placed and released for BMET. Also advised patient that per Dr. Excell Seltzer, cath would be rescheduled to next week. Patient verbalizes understanding and agrees that cath will be rescheduled for now and labs on 09/22/23 will determine if he is proceeding with cath next week.  Call to cath lab, moved cath to 09/28/23 at 11:30 with arrival time of 9:30. Patient advised to continue coumadin until 09/22/23.

## 2023-09-19 NOTE — Telephone Encounter (Signed)
Tony Zhang with [facility] is calling to report critical results.

## 2023-09-22 ENCOUNTER — Other Ambulatory Visit: Payer: Self-pay | Admitting: General Practice

## 2023-09-23 ENCOUNTER — Telehealth: Payer: Self-pay

## 2023-09-23 DIAGNOSIS — Z79899 Other long term (current) drug therapy: Secondary | ICD-10-CM

## 2023-09-23 DIAGNOSIS — E875 Hyperkalemia: Secondary | ICD-10-CM

## 2023-09-23 LAB — COMPREHENSIVE METABOLIC PANEL
ALT: 13 [IU]/L (ref 0–44)
AST: 28 [IU]/L (ref 0–40)
Albumin: 4.4 g/dL (ref 3.8–4.8)
Alkaline Phosphatase: 51 [IU]/L (ref 44–121)
BUN/Creatinine Ratio: 13 (ref 10–24)
BUN: 27 mg/dL (ref 8–27)
Bilirubin Total: 0.7 mg/dL (ref 0.0–1.2)
CO2: 21 mmol/L (ref 20–29)
Calcium: 9.5 mg/dL (ref 8.6–10.2)
Chloride: 106 mmol/L (ref 96–106)
Creatinine, Ser: 2.01 mg/dL — ABNORMAL HIGH (ref 0.76–1.27)
Globulin, Total: 2.8 g/dL (ref 1.5–4.5)
Glucose: 84 mg/dL (ref 70–99)
Potassium: 6.1 mmol/L (ref 3.5–5.2)
Sodium: 143 mmol/L (ref 134–144)
Total Protein: 7.2 g/dL (ref 6.0–8.5)
eGFR: 33 mL/min/{1.73_m2} — ABNORMAL LOW (ref >59)

## 2023-09-23 LAB — BASIC METABOLIC PANEL
BUN/Creatinine Ratio: 14 (ref 10–24)
BUN: 28 mg/dL — ABNORMAL HIGH (ref 8–27)
CO2: 21 mmol/L (ref 20–29)
Calcium: 9.7 mg/dL (ref 8.6–10.2)
Chloride: 108 mmol/L — ABNORMAL HIGH (ref 96–106)
Creatinine, Ser: 2.02 mg/dL — ABNORMAL HIGH (ref 0.76–1.27)
Glucose: 89 mg/dL (ref 70–99)
Potassium: 6.1 mmol/L (ref 3.5–5.2)
Sodium: 143 mmol/L (ref 134–144)
eGFR: 33 mL/min/{1.73_m2} — ABNORMAL LOW (ref >59)

## 2023-09-23 LAB — LIPID PANEL
Chol/HDL Ratio: 2.5 {ratio} (ref 0.0–5.0)
Cholesterol, Total: 98 mg/dL — ABNORMAL LOW (ref 100–199)
HDL: 39 mg/dL — ABNORMAL LOW (ref >39)
LDL Chol Calc (NIH): 31 mg/dL (ref 0–99)
Triglycerides: 172 mg/dL — ABNORMAL HIGH (ref 0–149)
VLDL Cholesterol Cal: 28 mg/dL (ref 5–40)

## 2023-09-23 MED ORDER — LOKELMA 10 G PO PACK: 10.0000 g | PACK | Freq: Three times a day (TID) | ORAL | 0 refills | Status: DC

## 2023-09-23 MED ORDER — LOKELMA 10 G PO PACK: 10.0000 g | PACK | ORAL | 0 refills | Status: DC

## 2023-09-23 NOTE — Telephone Encounter (Addendum)
Called patient to advise potassium elevated to stop Spironolactone and repeat BMET . Advised patient to start Lokelma 10 grams. Patient had understanding of instructions and of results.----- Message from Joni Reining sent at 09/23/2023  7:53 AM EST ----- Labs reviewed. He continues to have high potassium.  Discontinue spironolactone immediately. Triglycerides are elevated. Should follow up with PCP for management.

## 2023-09-23 NOTE — Telephone Encounter (Signed)
Per Katina Dung via secure chat: Hey Dr [facility] you take a look at [facility] done yesterday? K-6.1-cath rescheduled to 12/11 from 12/5 when 12/2  K 5.9 and SCr 2.37-Do you want to proceed with cath now scheduled for 12/11? He was going to hold  coumadin starting tomorrow for cath 12/11-if you want to cancel/reschedule 12/11 cath,  I will let him know to continue coumadin for now.  Dr Excell Seltzer: Thurston Hole I think if he gets Lokelma, stays off spiro and losartan, we can repeat labs early next week and plan to proceed with cath as scheduled. let's double his lasix dose for 2 days while he is taking Lokelma. thx  Lokelma called into pharmacy by Joni Reining, NP. Called and spoke with patient who said that his pharmacy didn't have it and ordered it for Monday. Explained that he cannot wait until Monday. Rx sent to [facility] on [address] per his request. Called pharmacy and they verified they do have it in stock. Patient will go tomorrow, 12/7 to pick it up. He verified that he did stop the Losartan and Spironolactone. He read-back instructions to increase Furosemide to 40mg  daily for two days while taking Lokelma. Pt stopped his Coumadin yesterday 12/5 as instructed in 09/19/23 phone note. Repeat BMET entered and he will come here Monday 12/9 to have drawn.

## 2023-09-23 NOTE — Telephone Encounter (Signed)
Called [facility] to change original prescription of Lokelma 10 gram pack from 1 packet  three Times a Day for 48 hours. Updated Prescription sent to pharmacy.

## 2023-09-26 ENCOUNTER — Other Ambulatory Visit: Payer: Self-pay | Admitting: *Deleted

## 2023-09-26 DIAGNOSIS — Z79899 Other long term (current) drug therapy: Secondary | ICD-10-CM

## 2023-09-27 ENCOUNTER — Telehealth: Payer: Self-pay

## 2023-09-27 ENCOUNTER — Telehealth: Payer: Self-pay | Admitting: *Deleted

## 2023-09-27 LAB — BASIC METABOLIC PANEL
BUN/Creatinine Ratio: 17 (ref 10–24)
BUN/Creatinine Ratio: 17 (ref 10–24)
BUN: 34 mg/dL — ABNORMAL HIGH (ref 8–27)
BUN: 36 mg/dL — ABNORMAL HIGH (ref 8–27)
CO2: 19 mmol/L — ABNORMAL LOW (ref 20–29)
CO2: 20 mmol/L (ref 20–29)
Calcium: 9 mg/dL (ref 8.6–10.2)
Calcium: 9 mg/dL (ref 8.6–10.2)
Chloride: 100 mmol/L (ref 96–106)
Chloride: 100 mmol/L (ref 96–106)
Creatinine, Ser: 2.01 mg/dL — ABNORMAL HIGH (ref 0.76–1.27)
Creatinine, Ser: 2.06 mg/dL — ABNORMAL HIGH (ref 0.76–1.27)
Glucose: 92 mg/dL (ref 70–99)
Glucose: 93 mg/dL (ref 70–99)
Potassium: 4.4 mmol/L (ref 3.5–5.2)
Potassium: 4.6 mmol/L (ref 3.5–5.2)
Sodium: 137 mmol/L (ref 134–144)
Sodium: 137 mmol/L (ref 134–144)
eGFR: 33 mL/min/{1.73_m2} — ABNORMAL LOW (ref >59)
eGFR: 32 mL/min/{1.73_m2} — ABNORMAL LOW (ref >59)

## 2023-09-27 NOTE — Telephone Encounter (Addendum)
Called patient regarding results. Patient had understanding of results.----- Message from Joni Reining sent at 09/27/2023  9:58 AM EST ----- I have reviewed his  labs. His potassium is much better after taking the medication to reduce it and stopping spironolactone. Kidney function is still elevated at 2.0. He may need extra fluid before having his cath  [facility]

## 2023-09-27 NOTE — Telephone Encounter (Signed)
Patient reports he does not have anyone to drive him home or be with him the first 24 hours he is home-if same day discharge. Patient aware he will need to plan to stay overnight at the hospital.  Patient plans to drive himself home the following day.

## 2023-09-27 NOTE — Telephone Encounter (Addendum)
Cardiac Catheterization scheduled at [facility] for: Wednesday September 28, 2023 11:30 AM Arrival time [facility] Main Entrance A at: 7 AM-pre-procedure hydration-per protocol GFR 32.  Nothing to eat after midnight prior to procedure, clear liquids until 5 AM day of procedure.  Medication instructions: -Hold:  Lasix-day before and AM of procedure-per protocol GFR < 60 (32)-pt has already taken today  Spironolactone/Losartan -on hold-confirmed with patient   Coumadin-pt reports he has not taken any since 12/5 or 12/6-knows to hold until post procedure. -Other usual morning medications can be taken with sips of water including aspirin 81 mg.  Patient reports he takes Jardiance in the afternoons..  Reviewed procedure instructions with patient.   Patient reports he took Lokelma 1 packet (10 g) daily 12/6,12/7,12/8, and 12/9 instead of 1 packet three times a day for 48 hours per directions on prescription.  09/26/23 K 4.6 -pt advised not take any more Lokelma.

## 2023-09-28 ENCOUNTER — Other Ambulatory Visit: Payer: Self-pay

## 2023-09-28 ENCOUNTER — Ambulatory Visit (HOSPITAL_COMMUNITY)
Admission: RE | Disposition: A | Discharge status: Home / Self Care | Payer: Self-pay | Source: Home / Self Care | Attending: Cardiovascular Disease

## 2023-09-28 ENCOUNTER — Ambulatory Visit (HOSPITAL_COMMUNITY)
Admission: RE | Admit: 2023-09-28 | Discharge: 2023-09-29 | Disposition: A | Discharge status: Home / Self Care | Payer: Medicare Other | Attending: Cardiovascular Disease | Admitting: Cardiovascular Disease

## 2023-09-28 DIAGNOSIS — E785 Hyperlipidemia, unspecified: Secondary | ICD-10-CM | POA: Diagnosis present

## 2023-09-28 DIAGNOSIS — I5032 Chronic diastolic (congestive) heart failure: Secondary | ICD-10-CM | POA: Insufficient documentation

## 2023-09-28 DIAGNOSIS — I10 Essential (primary) hypertension: Secondary | ICD-10-CM | POA: Insufficient documentation

## 2023-09-28 DIAGNOSIS — I739 Peripheral vascular disease, unspecified: Secondary | ICD-10-CM | POA: Diagnosis present

## 2023-09-28 DIAGNOSIS — I272 Pulmonary hypertension, unspecified: Secondary | ICD-10-CM | POA: Insufficient documentation

## 2023-09-28 DIAGNOSIS — I82409 Acute embolism and thrombosis of unspecified deep veins of unspecified lower extremity: Secondary | ICD-10-CM | POA: Diagnosis present

## 2023-09-28 DIAGNOSIS — I350 Nonrheumatic aortic (valve) stenosis: Secondary | ICD-10-CM | POA: Diagnosis present

## 2023-09-28 DIAGNOSIS — N1832 Chronic kidney disease, stage 3b: Secondary | ICD-10-CM | POA: Insufficient documentation

## 2023-09-28 DIAGNOSIS — I2584 Coronary atherosclerosis due to calcified coronary lesion: Secondary | ICD-10-CM | POA: Insufficient documentation

## 2023-09-28 DIAGNOSIS — Z7901 Long term (current) use of anticoagulants: Secondary | ICD-10-CM

## 2023-09-28 DIAGNOSIS — J90 Pleural effusion, not elsewhere classified: Secondary | ICD-10-CM

## 2023-09-28 DIAGNOSIS — I130 Hypertensive heart and chronic kidney disease with heart failure and stage 1 through stage 4 chronic kidney disease, or unspecified chronic kidney disease: Secondary | ICD-10-CM | POA: Insufficient documentation

## 2023-09-28 DIAGNOSIS — D62 Acute posthemorrhagic anemia: Secondary | ICD-10-CM

## 2023-09-28 DIAGNOSIS — I2510 Atherosclerotic heart disease of native coronary artery without angina pectoris: Secondary | ICD-10-CM | POA: Diagnosis not present

## 2023-09-28 HISTORY — PX: RIGHT HEART CATH AND CORONARY ANGIOGRAPHY: CATH118264

## 2023-09-28 LAB — POCT I-STAT EG7
Acid-base deficit: 1 mmol/L (ref 0.0–2.0)
Acid-base deficit: 2 mmol/L (ref 0.0–2.0)
Bicarbonate: 24.1 mmol/L (ref 20.0–28.0)
Bicarbonate: 23.5 mmol/L (ref 20.0–28.0)
Calcium, Ion: 1.17 mmol/L (ref 1.15–1.40)
Calcium, Ion: 1.11 mmol/L — ABNORMAL LOW (ref 1.15–1.40)
HCT: 45 % (ref 39.0–52.0)
HCT: 44 % (ref 39.0–52.0)
Hemoglobin: 15.3 g/dL (ref 13.0–17.0)
Hemoglobin: 15 g/dL (ref 13.0–17.0)
O2 Saturation: 65 %
O2 Saturation: 64 %
Potassium: 4 mmol/L (ref 3.5–5.1)
Potassium: 4 mmol/L (ref 3.5–5.1)
Sodium: 143 mmol/L (ref 135–145)
Sodium: 144 mmol/L (ref 135–145)
TCO2: 25 mmol/L (ref 22–32)
TCO2: 25 mmol/L (ref 22–32)
pCO2, Ven: 42.7 mm[Hg] — ABNORMAL LOW (ref 44–60)
pCO2, Ven: 42.6 mm[Hg] — ABNORMAL LOW (ref 44–60)
pH, Ven: 7.359 (ref 7.25–7.43)
pH, Ven: 7.35 (ref 7.25–7.43)
pO2, Ven: 35 mm[Hg] (ref 32–45)
pO2, Ven: 35 mm[Hg] (ref 32–45)

## 2023-09-28 LAB — PROTIME-INR
INR: 1.1 (ref 0.8–1.2)
Prothrombin Time: 14.6 s (ref 11.4–15.2)

## 2023-09-28 LAB — GLUCOSE, CAPILLARY: Glucose-Capillary: 89 mg/dL (ref 70–99)

## 2023-09-28 SURGERY — RIGHT HEART CATH AND CORONARY ANGIOGRAPHY
Anesthesia: LOCAL

## 2023-09-28 MED ORDER — LIDOCAINE HCL (PF) 1 % IJ SOLN
INTRAMUSCULAR | Status: DC | PRN
  Administered 2023-09-28: 5 mL
  Administered 2023-09-28: 2 mL

## 2023-09-28 MED ORDER — HEPARIN SODIUM (PORCINE) 1000 UNIT/ML IJ SOLN
INTRAMUSCULAR | Status: DC | PRN
  Administered 2023-09-28: 5000 [IU] via INTRAVENOUS

## 2023-09-28 MED ORDER — MIDAZOLAM HCL 2 MG/2ML IJ SOLN
INTRAMUSCULAR | Status: AC
  Filled 2023-09-28: qty 2

## 2023-09-28 MED ORDER — HEPARIN (PORCINE) IN NACL 1000-0.9 UT/500ML-% IV SOLN
INTRAVENOUS | Status: DC | PRN
  Administered 2023-09-28 (×2): 500 mL

## 2023-09-28 MED ORDER — LIDOCAINE HCL (PF) 1 % IJ SOLN
INTRAMUSCULAR | Status: AC
  Filled 2023-09-28: qty 30

## 2023-09-28 MED ORDER — VERAPAMIL HCL 2.5 MG/ML IV SOLN
INTRAVENOUS | Status: DC | PRN
  Administered 2023-09-28: 10 mL via INTRA_ARTERIAL

## 2023-09-28 MED ORDER — SODIUM CHLORIDE 0.9% FLUSH: 3.0000 mL | Freq: Two times a day (BID) | INTRAVENOUS | Status: DC

## 2023-09-28 MED ORDER — TAMSULOSIN HCL 0.4 MG PO CAPS
0.4000 mg | ORAL_CAPSULE | Freq: Every evening | ORAL | Status: DC
  Administered 2023-09-28: 0.4 mg via ORAL
  Filled 2023-09-28: qty 1

## 2023-09-28 MED ORDER — METOPROLOL SUCCINATE ER 100 MG PO TB24
100.0000 mg | ORAL_TABLET | Freq: Every day | ORAL | Status: DC
  Administered 2023-09-29: 100 mg via ORAL
  Filled 2023-09-28: qty 1

## 2023-09-28 MED ORDER — FUROSEMIDE 20 MG PO TABS
20.0000 mg | ORAL_TABLET | Freq: Every day | ORAL | Status: DC
  Administered 2023-09-28 – 2023-09-29 (×2): 20 mg via ORAL
  Filled 2023-09-28 (×2): qty 1

## 2023-09-28 MED ORDER — GABAPENTIN 300 MG PO CAPS: 300.0000 mg | ORAL_CAPSULE | Freq: Two times a day (BID) | ORAL | Status: DC

## 2023-09-28 MED ORDER — FENTANYL CITRATE (PF) 100 MCG/2ML IJ SOLN
INTRAMUSCULAR | Status: AC
  Filled 2023-09-28: qty 2

## 2023-09-28 MED ORDER — WARFARIN SODIUM 2 MG PO TABS
2.0000 mg | ORAL_TABLET | ORAL | Status: DC
  Administered 2023-09-28: 2 mg via ORAL
  Filled 2023-09-28: qty 1

## 2023-09-28 MED ORDER — SODIUM CHLORIDE 0.9 % WEIGHT BASED INFUSION: 1.0000 mL/kg/h | INTRAVENOUS | Status: DC

## 2023-09-28 MED ORDER — WARFARIN SODIUM 2 MG PO TABS: 2.0000 mg | ORAL_TABLET | Freq: Every day | ORAL | Status: DC

## 2023-09-28 MED ORDER — FENTANYL CITRATE (PF) 100 MCG/2ML IJ SOLN
INTRAMUSCULAR | Status: DC | PRN
  Administered 2023-09-28: 25 ug via INTRAVENOUS

## 2023-09-28 MED ORDER — VERAPAMIL HCL 2.5 MG/ML IV SOLN
INTRAVENOUS | Status: AC
  Filled 2023-09-28: qty 2

## 2023-09-28 MED ORDER — ACETAMINOPHEN 325 MG PO TABS: 650.0000 mg | ORAL_TABLET | ORAL | Status: DC | PRN

## 2023-09-28 MED ORDER — ASPIRIN 81 MG PO CHEW: 81.0000 mg | CHEWABLE_TABLET | ORAL | Status: DC

## 2023-09-28 MED ORDER — WARFARIN SODIUM 2 MG PO TABS: 4.0000 mg | ORAL_TABLET | ORAL | Status: DC

## 2023-09-28 MED ORDER — HYDRALAZINE HCL 20 MG/ML IJ SOLN: 10.0000 mg | INTRAMUSCULAR | Status: AC | PRN

## 2023-09-28 MED ORDER — SODIUM CHLORIDE 0.9 % WEIGHT BASED INFUSION
3.0000 mL/kg/h | INTRAVENOUS | Status: DC
  Administered 2023-09-28: 3 mL/kg/h via INTRAVENOUS

## 2023-09-28 MED ORDER — ALLOPURINOL 300 MG PO TABS
300.0000 mg | ORAL_TABLET | Freq: Every day | ORAL | Status: DC
Start: 2023-09-28 — End: 2023-09-29
  Administered 2023-09-28: 300 mg via ORAL
  Filled 2023-09-28: qty 1

## 2023-09-28 MED ORDER — ONDANSETRON HCL 4 MG/2ML IJ SOLN
4.0000 mg | Freq: Four times a day (QID) | INTRAMUSCULAR | Status: DC | PRN
  Administered 2023-09-29: 4 mg via INTRAVENOUS
  Filled 2023-09-28: qty 2

## 2023-09-28 MED ORDER — HYDRALAZINE HCL 20 MG/ML IJ SOLN
INTRAMUSCULAR | Status: AC
  Filled 2023-09-28: qty 1

## 2023-09-28 MED ORDER — OXYCODONE HCL 5 MG PO TABS
5.0000 mg | ORAL_TABLET | ORAL | Status: DC | PRN
  Administered 2023-09-28 – 2023-09-29 (×3): 10 mg via ORAL
  Filled 2023-09-28 (×3): qty 2

## 2023-09-28 MED ORDER — ONDANSETRON HCL 4 MG PO TABS: 8.0000 mg | ORAL_TABLET | Freq: Two times a day (BID) | ORAL | Status: DC | PRN

## 2023-09-28 MED ORDER — LABETALOL HCL 5 MG/ML IV SOLN: 10.0000 mg | INTRAVENOUS | Status: AC | PRN

## 2023-09-28 MED ORDER — MIDAZOLAM HCL 2 MG/2ML IJ SOLN
INTRAMUSCULAR | Status: DC | PRN
  Administered 2023-09-28: 2 mg via INTRAVENOUS

## 2023-09-28 MED ORDER — GABAPENTIN 300 MG PO CAPS
300.0000 mg | ORAL_CAPSULE | Freq: Two times a day (BID) | ORAL | Status: DC
Start: 2023-09-28 — End: 2023-09-29
  Administered 2023-09-28 – 2023-09-29 (×2): 300 mg via ORAL
  Filled 2023-09-28 (×2): qty 1

## 2023-09-28 MED ORDER — MOMETASONE FURO-FORMOTEROL FUM 200-5 MCG/ACT IN AERO
2.0000 | INHALATION_SPRAY | Freq: Two times a day (BID) | RESPIRATORY_TRACT | Status: DC
  Administered 2023-09-28: 2 via RESPIRATORY_TRACT
  Filled 2023-09-28: qty 8.8

## 2023-09-28 MED ORDER — ATORVASTATIN CALCIUM 40 MG PO TABS
40.0000 mg | ORAL_TABLET | Freq: Every day | ORAL | Status: DC
  Administered 2023-09-28 – 2023-09-29 (×2): 40 mg via ORAL
  Filled 2023-09-28 (×2): qty 1

## 2023-09-28 MED ORDER — OXYCODONE HCL 5 MG PO TABS
ORAL_TABLET | ORAL | Status: AC
  Administered 2023-09-28: 10 mg
  Filled 2023-09-28: qty 2

## 2023-09-28 MED ORDER — WARFARIN - PHYSICIAN DOSING INPATIENT: Freq: Every day | Status: DC

## 2023-09-28 MED ORDER — SODIUM CHLORIDE 0.9 % IV SOLN
250.0000 mL | INTRAVENOUS | Status: DC | PRN
Start: 2023-09-28 — End: 2023-09-29

## 2023-09-28 MED ORDER — SODIUM CHLORIDE 0.9% FLUSH: 3.0000 mL | INTRAVENOUS | Status: DC | PRN

## 2023-09-28 MED ORDER — NITROGLYCERIN 0.3 MG SL SUBL: 0.3000 mg | SUBLINGUAL_TABLET | SUBLINGUAL | Status: DC | PRN

## 2023-09-28 MED ORDER — HEPARIN SODIUM (PORCINE) 1000 UNIT/ML IJ SOLN
INTRAMUSCULAR | Status: AC
  Filled 2023-09-28: qty 10

## 2023-09-28 MED ORDER — SODIUM CHLORIDE 0.9 % IV SOLN: INTRAVENOUS | Status: AC

## 2023-09-28 MED ORDER — EMPAGLIFLOZIN 10 MG PO TABS
10.0000 mg | ORAL_TABLET | Freq: Every day | ORAL | Status: DC
  Administered 2023-09-28 – 2023-09-29 (×2): 10 mg via ORAL
  Filled 2023-09-28 (×2): qty 1

## 2023-09-28 MED ORDER — IOHEXOL 350 MG/ML SOLN
INTRAVENOUS | Status: DC | PRN
  Administered 2023-09-28: 55 mL via INTRA_ARTERIAL

## 2023-09-28 SURGICAL SUPPLY — 13 items
CATH BALLN WEDGE 5F 110CM (CATHETERS) IMPLANT
CATH INFINITI 5FR JL5 (CATHETERS) IMPLANT
CATH INFINITI 5FR MULTPACK ANG (CATHETERS) IMPLANT
DEVICE RAD COMP TR BAND LRG (VASCULAR PRODUCTS) IMPLANT
ELECT DEFIB PAD ADLT CADENCE (PAD) IMPLANT
GLIDESHEATH SLEND SS 6F .021 (SHEATH) IMPLANT
GUIDEWIRE .025 260CM (WIRE) IMPLANT
GUIDEWIRE INQWIRE 1.5J.035X260 (WIRE) IMPLANT
INQWIRE 1.5J .035X260CM (WIRE) ×1
PACK CARDIAC CATHETERIZATION (CUSTOM PROCEDURE TRAY) ×1 IMPLANT
SET ATX-X65L (MISCELLANEOUS) IMPLANT
SHEATH GLIDE SLENDER 4/5FR (SHEATH) IMPLANT
WIRE HI TORQ VERSACORE-J 145CM (WIRE) IMPLANT

## 2023-09-28 NOTE — Progress Notes (Signed)
Patient arrived from Cath lab to 4E.  VSS.  Patient complaining of chronic back pain, 5/10.  Requesting oxycodone.      09/28/23 1521  Vitals  Temp 98.7 F (37.1 C)  Temp Source Oral  BP (!) 146/60  MAP (mmHg) 90  BP Location Right Arm  BP Method Automatic  Patient Position (if appropriate) Sitting  Pulse Rate 68  Pulse Rate Source Monitor  ECG Heart Rate 68  Resp 18  Level of Consciousness  Level of Consciousness Alert  Oxygen Therapy  SpO2 93 %  O2 Device Nasal Cannula  O2 Flow Rate (L/min) 2 L/min  Patient Activity (if Appropriate) In bed  Pulse Oximetry Type Continuous  Pain Assessment  Pain Scale 0-10  Pain Score 5  Pain Type Chronic pain  Pain Location Back  Pain Orientation Left;Lower  Pain Descriptors / Indicators Aching  Pain Onset On-going  Patients Stated Pain Goal 0  Pain Intervention(s) Emotional support  PCA/Epidural/Spinal Assessment  Respiratory Pattern Regular

## 2023-09-28 NOTE — H&P (Signed)
Expand All Collapse All  Cardiology Office Note:     Date:  08/25/2023    ID:  Tony Zhang, DOB [birth year], MRN [id number]   PCP:  [name], MD              [facility] Providers Cardiologist:  Thurmon Fair, MD      Referring MD: [name], MD        Chief Complaint  Patient presents with   Follow-up      Aortic stenosis      History of Present Illness:     Tony Zhang is a 77 y.o. male presenting for follow-up of severe aortic stenosis.  I saw him initially in May 2024.  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  He also has a history of stage IIIb chronic kidney disease and COPD.  He was diagnosed with severe paradoxical low-flow low gradient aortic stenosis.  Due to his comorbidities and clinical stability, he elected for continued observation rather than proceeding with TAVR evaluation when I initially saw him.  A recent echocardiogram for follow-up showed stable LVEF of 60 to 65%, normal RV function, no mitral valve disease, and worsening of his low-flow low gradient aortic stenosis with a mean gradient of 32 mmHg, V-max of 3.5 m/s, stroke-volume index of 28, dimensionless index of 0.18, and aortic valve area of 0.67 cm.   The patient is here alone today.  He remains short of breath with activity but reports only slow progression of his shortness of breath over the past year.  He is not very active in light of his comorbid medical problems.  Neuropathy seems to be a major limitation for him.  He does some work at a [employer] on the weekends and with this level of physical activity he is short of breath.  He denies orthopnea, PND, or chest pain. He has no lightheadedness or syncope.   Current Medications: Active Medications      Current Meds  Medication Sig   albuterol (VENTOLIN HFA) 108 (90 Base) MCG/ACT  inhaler Inhale 2 puffs into the lungs in the morning and at bedtime.   allopurinol (ZYLOPRIM) 300 MG tablet Take 300 mg by mouth at bedtime.   budesonide-formoterol (SYMBICORT) 160-4.5 MCG/ACT inhaler Inhale 2 puffs into the lungs in the morning and at bedtime.   Choline Fenofibrate (FENOFIBRIC ACID) 135 MG CPDR Take 135 mg by mouth daily.   cilostazol (PLETAL) 100 MG tablet Take 100 mg by mouth daily.   cyanocobalamin 1000 MCG tablet Take 1 tablet (1,000 mcg total) by mouth daily.   Evolocumab (REPATHA SURECLICK) 140 MG/ML SOAJ Inject 140 mg into the skin every 14 (fourteen) days.   gabapentin (NEURONTIN) 300 MG capsule Take 1 capsule (300 mg total) by mouth 2 (two) times daily.   ondansetron (ZOFRAN) 8 MG tablet Take 8 mg by mouth 2 (two) times daily as needed for vomiting or nausea.   pyridOXINE (B-6) 50 MG tablet Take 1 tablet (50 mg total) by mouth daily.   tamsulosin (FLOMAX) 0.4 MG CAPS capsule Take 0.4 mg by mouth every evening.   warfarin (COUMADIN) 2 MG tablet Take 2-4 mg by mouth See admin instructions. Take 2 tablets (4mg ) by mouth Saturday and Sunday, then take 1 tablet (2mg ) all other days   [DISCONTINUED] atorvastatin (LIPITOR) 40 MG tablet Take 1 tablet (40 mg total) by mouth daily.   [  DISCONTINUED] empagliflozin (JARDIANCE) 10 MG TABS tablet Take 1 tablet (10 mg total) by mouth daily.   [DISCONTINUED] furosemide (LASIX) 20 MG tablet TAKE 1 TABLET BY MOUTH DAILY   [DISCONTINUED] losartan (COZAAR) 50 MG tablet Take 1 tablet (50 mg total) by mouth daily.   [DISCONTINUED] metoprolol succinate (TOPROL-XL) 100 MG 24 hr tablet Take 100 mg by mouth daily.    [DISCONTINUED] nitroGLYCERIN (NITROSTAT) 0.3 MG SL tablet Place under tongue. May take every 5 minutes up to 3 doses for chest pain. If still having pain after 3 doses, call 911.   [DISCONTINUED] spironolactone (ALDACTONE) 25 MG tablet Take 0.5 tablets (12.5 mg total) by mouth daily.        Allergies:   Codeine    ROS:   Please  see the history of present illness.    All other systems reviewed and are negative.   EKGs/Labs/Other Studies Reviewed:     The following studies were reviewed today: Cardiac Studies & Procedures Objective CARDIAC CATHETERIZATION   CARDIAC CATHETERIZATION 06/04/2022   Narrative   Ost RCA to Prox RCA lesion is 80% stenosed.  Heavily calcified.  Would require atherectomy to fix.   Prox RCA lesion is 25% stenosed.   Mid RCA lesion is 70% stenosed.  Diffuse disease between the ostial lesion and this lesion.   3rd Mrg lesion is 75% stenosed.   1st Diag lesion is 50% stenosed.   Ost LAD to Mid LAD lesion is 25% stenosed.   Mid LAD lesion is 90% stenosed.   A drug-eluting stent was successfully placed using a SYNERGY XD 3.0X16, postdilated to greater than 3.5 mm.   Post intervention, there is a 0% residual stenosis.   The left ventricular systolic function is normal.   LV end diastolic pressure is normal.   The left ventricular ejection fraction is 50-55% by visual estimate.   There is no aortic valve stenosis.   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  There is moderate diffuse disease in the proximal LAD.  The culprit lesion was a severe stenosis in the mid LAD.  This was successfully treated with a 3.0 x 16 Synergy drug-eluting stent, postdilated to greater than 3.5 mm in diameter.   Resume IV heparin 2 hours after the TR band is removed.  Coumadin can also be resumed tonight.  Target INR greater than 2.  Aspirin will be used for 1 month and then stop.  Clopidogrel will be continued for ideally 12 months if no bleeding problems.   Plan discussed with Dr. Excell Seltzer who is the primary cardiologist.  Would plan medical therapy for his residual disease.   Findings Coronary Findings Diagnostic  Dominance: Co-dominant   Left Anterior Descending Ost LAD to Mid LAD lesion is 25% stenosed. Mid LAD lesion is 90% stenosed. The lesion is eccentric and ulcerative.   First Diagonal  Branch Vessel is small in size. 1st Diag lesion is 50% stenosed.   Left Circumflex The vessel exhibits minimal luminal irregularities.   Third Obtuse Marginal Branch Vessel is large in size. 3rd Mrg lesion is 75% stenosed.   Right Coronary Artery Ost RCA to Prox RCA lesion is 80% stenosed. The lesion is severely calcified. Prox RCA lesion is 25% stenosed. Mid RCA lesion is 70% stenosed.   Intervention   Mid LAD lesion Stent CATH LAUNCHER 6FR EBU3.5 guide catheter was inserted. Lesion crossed with guidewire using a WIRE ASAHI PROWATER 180CM. Pre-stent angioplasty was performed using a BALLN SAPPHIRE 2.5X12. A drug-eluting stent was  successfully placed using a SYNERGY XD 3.0X16. Stent strut is well apposed. Post-stent angioplasty was performed using a BALL SAPPHIRE NC24 3.5X12. Post-Intervention Lesion Assessment The intervention was successful. Pre-interventional TIMI flow is 3. Post-intervention TIMI flow is 3. No complications occurred at this lesion. There is a 0% residual stenosis post intervention.     ECHOCARDIOGRAM   ECHOCARDIOGRAM COMPLETE 08/16/2023   Narrative ECHOCARDIOGRAM REPORT       Patient Name:   Tony Zhang Date of Exam: 08/16/2023 Medical Rec #:  [id number]     Height:       73.0 in Accession #:    [id number]    Weight:       224.0 lb Date of Birth:  10/13/1946     BSA:          2.258 m Patient Age:    77 years      BP:           142/78 mmHg Patient Gender: M             HR:           67 bpm. Exam Location:  Outpatient   Procedure: 2D Echo, Cardiac Doppler and Color Doppler   MODIFIED REPORT: This report was modified by Thomasene Ripple DO on 08/16/2023 due to aortic dilataion. Indications:     Aortic stenosis I35.0   History:         Patient has prior history of Echocardiogram examinations, most recent 02/28/2023. CHF, Previous Myocardial Infarction, COPD, Aortic Valve Disease, Signs/Symptoms:Shortness of Breath; Risk Factors:Sleep Apnea,  Hypertension and Dyslipidemia. Abdominal Aortic Aneurysm, Aortic Arch Aneurysm.   Sonographer:     Eulah Pont RDCS Referring Phys:  [phone] Mekisha Bittel Diagnosing Phys: Lavona Mound Tobb DO   IMPRESSIONS     1. The aortic valve is thickened and moderately calcified. Aortic valve regurgitation is mild. Paradoxical Severe Low flow low gradient aortic stenosis. Aortic regurgitation PHT measures 549 msec. Aortic valve area, by VTI measures 0.67 cm. Aortic valve mean gradient measures 31.8 mmHg. Aortic valve Vmax 3.53 m/s, SVI 28, DI 0.18, Indexed AVA 0.29. 2. Left ventricular ejection fraction, by estimation, is 60 to 65%. The left ventricle has normal function. The left ventricle has no regional wall motion abnormalities. There is mild concentric left ventricular hypertrophy. Left ventricular diastolic parameters are indeterminate. 3. Right ventricular systolic function is normal. The right ventricular size is normal. 4. The mitral valve is degenerative. No evidence of mitral valve regurgitation. No evidence of mitral stenosis. 5. The inferior vena cava is normal in size with greater than 50% respiratory variability, suggesting right atrial pressure of 3 mmHg. 6. There is mild dilatation of the ascending aorta, measuring 40 mm.   FINDINGS Left Ventricle: Left ventricular ejection fraction, by estimation, is 60 to 65%. The left ventricle has normal function. The left ventricle has no regional wall motion abnormalities. The left ventricular internal cavity size was normal in size. There is mild concentric left ventricular hypertrophy. Left ventricular diastolic parameters are indeterminate.   Right Ventricle: The right ventricular size is normal. No increase in right ventricular wall thickness. Right ventricular systolic function is normal.   Left Atrium: Left atrial size was normal in size.   Right Atrium: Right atrial size was normal in size.   Pericardium: There is no evidence of  pericardial effusion. Presence of epicardial fat layer.   Mitral Valve: The mitral valve is degenerative in appearance. Mild to moderate mitral annular calcification. No evidence of  mitral valve regurgitation. No evidence of mitral valve stenosis.   Tricuspid Valve: The tricuspid valve is normal in structure. Tricuspid valve regurgitation is not demonstrated. No evidence of tricuspid stenosis.   Aortic Valve: The aortic valve is calcified. There is moderate calcification of the aortic valve. There is moderate thickening of the aortic valve. Aortic valve regurgitation is mild. Aortic regurgitation PHT measures 549 msec. Parodoxical Severe Low flow low gradient aortic stenosis. Aortic valve mean gradient measures 31.8 mmHg. Aortic valve peak gradient measures 49.9 mmHg. Aortic valve area, by VTI measures 0.67 cm.   Pulmonic Valve: The pulmonic valve was normal in structure. Pulmonic valve regurgitation is not visualized. No evidence of pulmonic stenosis.   Aorta: There is mild dilatation of the ascending aorta, measuring 40 mm.   Venous: The inferior vena cava is normal in size with greater than 50% respiratory variability, suggesting right atrial pressure of 3 mmHg.   IAS/Shunts: No atrial level shunt detected by color flow Doppler.     LEFT VENTRICLE PLAX 2D LVIDd:         5.05 cm     Diastology LVIDs:         3.00 cm     LV e' medial:    5.35 cm/s LV PW:         1.05 cm     LV E/e' medial:  23.4 LV IVS:        1.05 cm     LV e' lateral:   6.49 cm/s LVOT diam:     2.20 cm     LV E/e' lateral: 19.3 LV SV:         63 LV SV Index:   28 LVOT Area:     3.80 cm   LV Volumes (MOD) LV vol d, MOD A2C: 87.5 ml LV vol d, MOD A4C: 96.5 ml LV vol s, MOD A2C: 32.5 ml LV vol s, MOD A4C: 38.5 ml LV SV MOD A2C:     55.0 ml LV SV MOD A4C:     96.5 ml LV SV MOD BP:      55.8 ml   RIGHT VENTRICLE RV S prime:     10.60 cm/s TAPSE (M-mode): 1.9 cm   LEFT ATRIUM             Index        RIGHT  ATRIUM           Index LA diam:        5.10 cm 2.26 cm/m   RA Area:     14.80 cm LA Vol (A2C):   51.2 ml 22.68 ml/m  RA Volume:   34.80 ml  15.41 ml/m LA Vol (A4C):   58.1 ml 25.73 ml/m LA Biplane Vol: 56.7 ml 25.11 ml/m AORTIC VALVE AV Area (Vmax):    0.84 cm AV Area (Vmean):   0.72 cm AV Area (VTI):     0.67 cm AV Vmax:           353.25 cm/s AV Vmean:          268.750 cm/s AV VTI:            0.954 m AV Peak Grad:      49.9 mmHg AV Mean Grad:      31.8 mmHg LVOT Vmax:         78.10 cm/s LVOT Vmean:        50.700 cm/s LVOT VTI:          0.167  m LVOT/AV VTI ratio: 0.18 AI PHT:            549 msec   AORTA Ao Root diam: 3.20 cm Ao Asc diam:  3.95 cm   MITRAL VALVE MV Area (PHT): 3.42 cm     SHUNTS MV Decel Time: 222 msec     Systemic VTI:  0.17 m MV E velocity: 125.00 cm/s  Systemic Diam: 2.20 cm MV A velocity: 118.00 cm/s MV E/A ratio:  1.06   Kardie Tobb DO Electronically signed by Thomasene Ripple DO Signature Date/Time: 08/16/2023/11:41:14 AM       Final (Updated)                EKG:   EKG Interpretation Date/Time:                  Thursday August 25 2023 11:23:02 EST Ventricular Rate:         73 PR Interval:                 188 QRS Duration:             86 QT Interval:                 360 QTC Calculation:396 R Axis:                         -75   Text Interpretation:Normal sinus rhythm Left axis deviation Left ventricular hypertrophy with repolarization abnormality ( R in aVL ) Inferior infarct (cited on or before 11-Jul-2023) Anteroseptal infarct (cited on or before 11-Jul-2023) When compared with ECG of 11-Jul-2023 09:31, Questionable change in initial forces of Septal leads Confirmed by Tonny Bollman [phone]) on 08/25/2023 11:40:12 AM     Recent Labs: 09/08/2022: NT-Pro BNP 432 02/25/2023: B Natriuretic Peptide 370.4 02/27/2023: ALT 12; TSH 0.531 02/28/2023: Hemoglobin 13.6; Magnesium 1.9; Platelets 294 04/29/2023: BUN 24; Creatinine, Ser 1.69;  Potassium 5.6; Sodium 141  Recent Lipid Panel Labs (Brief)          Component Value Date/Time    CHOL 121 04/29/2023 0842    TRIG 210 (H) 04/29/2023 0842    HDL 38 (L) 04/29/2023 0842    CHOLHDL 3.2 04/29/2023 0842    CHOLHDL 3.9 06/03/2022 0257    VLDL 19 06/03/2022 0257    LDLCALC 49 04/29/2023 0842                  Physical Exam:     VS:  BP (!) 140/86   Pulse 72   Ht 6\' 1"  (1.854 m)   Wt 230 lb 6.4 oz (104.5 kg)   SpO2 94%   BMI 30.40 kg/m         Wt Readings from Last 3 Encounters:  08/25/23 230 lb 6.4 oz (104.5 kg)  07/11/23 224 lb (101.6 kg)  04/29/23 210 lb (95.3 kg)      GEN:  Well nourished, well developed, elderly male in no acute distress HEENT: Normal NECK: No JVD; No carotid bruits LYMPHATICS: No lymphadenopathy CARDIAC: RRR, 3/6 harsh crescendo decrescendo murmur at the right upper sternal border RESPIRATORY:  Clear to auscultation without rales, wheezing or rhonchi  ABDOMEN: Soft, non-tender, non-distended MUSCULOSKELETAL:  No edema; No deformity  SKIN: Warm and dry NEUROLOGIC:  Alert and oriented x 3 PSYCHIATRIC:  Normal affect    Assessment & Plan Aortic valve stenosis, nonrheumatic The patient has severe paradoxical low-flow low gradient aortic stenosis with NYHA functional class  II symptoms of fatigue and exertional dyspnea.  He has multiple comorbid medical conditions outlined above.  His aortic stenosis has progressed by serial echo studies.  His aortic valve is severely calcified and restricted on 2D imaging.  Doppler assessment meets all criteria for paradoxical low-flow low gradient aortic stenosis with a mean gradient of 32 mmHg, stroke-volume index of 28, dimensionless index of 0.18, and aortic valve area of 0.67.  He does not have concomitant valvular disease affecting the mitral or tricuspid valves.  We reviewed the natural history of aortic stenosis today.  We discussed potential treatment options in light of his comorbidities.  I do  not think he would be a candidate for conventional heart surgery, but will defer to formal cardiac surgical consultation once he undergoes appropriate preoperative testing with a cardiac catheterization and CTA studies of the heart as well as the chest, abdomen, and pelvis. I have reviewed the risks, indications, and alternatives to cardiac catheterization, possible angioplasty, and stenting with the patient. Risks include but are not limited to bleeding, infection, vascular injury, stroke, myocardial infection, arrhythmia, kidney injury, radiation-related injury in the case of prolonged fluoroscopy use, emergency cardiac surgery, and death. The patient understands the risks of serious complication is 1-2 in 1000 with diagnostic cardiac cath and 1-2% or less with angioplasty/stenting.  Will consider left radial access as his left radial pulses stronger than that on the right.  His distal aorta is occluded so transfemoral access is not an option.  His cardiac catheterization and CTA studies will have to be staged in light of his chronic kidney disease.  I looked over his CTA from 1 year ago and I think both his left carotid and left subclavian artery are suitable for insertion of a transcatheter aortic valve.  There is some calcification in the proximal portion of the left subclavian artery that may be borderline and we will better assess that on his upcoming CTA scan.  The patient will have to hold warfarin before his catheterization.  For a short period of anticoagulation interruption, I do not think he requires anticoagulation bridging. Stage 3b chronic kidney disease (HCC) Check labs, stage contrast studies.       Addendum: Cath delayed about 1 week because of hyperkalemia that required discontinuation of losartan and spironolactone. Even with med changes his K remained >6.0. He was treated with Lokelma and K normalized. Otherwise no clinical changes from documentation above. Plan cardiac cath today via left  radial access, and then CTA studies pending cath findings. Otherwise, as outlined above.   Tonny Bollman 09/28/2023 10:18 AM

## 2023-09-28 NOTE — Plan of Care (Signed)
  Problem: Education: Goal: Understanding of CV disease, CV risk reduction, and recovery process will improve Outcome: Progressing   Problem: Health Behavior/Discharge Planning: Goal: Ability to safely manage health-related needs after discharge will improve Outcome: Progressing   

## 2023-09-29 ENCOUNTER — Encounter (HOSPITAL_COMMUNITY): Payer: Self-pay | Admitting: Cardiovascular Disease

## 2023-09-29 DIAGNOSIS — I2510 Atherosclerotic heart disease of native coronary artery without angina pectoris: Secondary | ICD-10-CM | POA: Diagnosis not present

## 2023-09-29 DIAGNOSIS — I10 Essential (primary) hypertension: Secondary | ICD-10-CM | POA: Insufficient documentation

## 2023-09-29 DIAGNOSIS — I350 Nonrheumatic aortic (valve) stenosis: Secondary | ICD-10-CM | POA: Diagnosis not present

## 2023-09-29 LAB — BASIC METABOLIC PANEL
Anion gap: 8 (ref 5–15)
BUN: 21 mg/dL (ref 8–23)
CO2: 23 mmol/L (ref 22–32)
Calcium: 8.6 mg/dL — ABNORMAL LOW (ref 8.9–10.3)
Chloride: 108 mmol/L (ref 98–111)
Creatinine, Ser: 1.74 mg/dL — ABNORMAL HIGH (ref 0.61–1.24)
GFR, Estimated: 40 mL/min — ABNORMAL LOW (ref >60)
Glucose, Bld: 91 mg/dL (ref 70–99)
Potassium: 3.9 mmol/L (ref 3.5–5.1)
Sodium: 139 mmol/L (ref 135–145)

## 2023-09-29 LAB — PROTIME-INR
INR: 1.1 (ref 0.8–1.2)
Prothrombin Time: 14 s (ref 11.4–15.2)

## 2023-09-29 MED FILL — Hydralazine HCl Inj 20 MG/ML: INTRAMUSCULAR | Qty: 1 | Status: AC

## 2023-09-29 NOTE — Progress Notes (Signed)
Patient verbalized understanding of dc instructions. All belongings given to patient. Volunteer to wheel patient out.

## 2023-09-29 NOTE — Progress Notes (Addendum)
Patient Name: Tony Zhang Date of Encounter: 09/29/2023 [facility] Cardiologist: Thurmon Fair, MD   Interval Summary  .    Complaining of some nausea after eating sausage this morning.  No chest pain or shortness of breath. Giving zofran.    Vital Signs .    Vitals:   09/28/23 2049 09/28/23 2329 09/29/23 0343 09/29/23 0756  BP:  (!) 118/49 (!) 152/57 (!) 144/53  Pulse:  65 74 66  Resp:  17 16 18   Temp:  97.8 F (36.6 C) 97.8 F (36.6 C) 98.1 F (36.7 C)  TempSrc:  Oral Oral Oral  SpO2: 95% 93% 92% 93%  Weight:      Height:        Intake/Output Summary (Last 24 hours) at 09/29/2023 0903 Last data filed at 09/29/2023 0700 Gross per 24 hour  Intake 720 ml  Output 1950 ml  Net -1230 ml      09/28/2023    6:29 AM 08/25/2023   11:25 AM 07/11/2023    9:22 AM  Last 3 Weights  Weight (lbs) 230 lb 230 lb 6.4 oz 224 lb  Weight (kg) 104.327 kg 104.509 kg 101.606 kg      Telemetry/ECG    Sinus rhythm heart rates in the 70s- Personally Reviewed  CV Studies    Right left heart catheterization 09/28/2023   Ost LAD to Mid LAD lesion is 50% stenosed.   Ost RCA to Prox RCA lesion is 80% stenosed.   Prox RCA lesion is 25% stenosed.   Mid RCA lesion is 70% stenosed.   1st Diag lesion is 50% stenosed.   3rd Mrg lesion is 75% stenosed.   Dist RCA lesion is 50% stenosed.   Non-stenotic Mid LAD lesion was previously treated.   Hemodynamic findings consistent with moderate pulmonary hypertension.   1.  Severe calcific stenosis of the RCA, unchanged from the previous cardiac catheterization procedure 2.  Continued patency of the stented segment in the mid LAD with moderate nonobstructive plaquing in the proximal vessel 3.  Patent left circumflex with no high-grade stenosis 4.  Patent left subclavian artery with mild calcified stenosis 5.  Calcified aortic valve leaflets with known severe aortic stenosis by noninvasive assessment 6.  Moderate pulmonary  hypertension with mean PA pressure 39 mmHg, transpulmonary gradient 20 mmHg, PVR 4.26 Wood units   Recommendations: Continue TAVR evaluation.  Medical therapy for CAD.  Hydration with repeat labs in the morning, overnight observation.  Echocardiogram 08/16/2023  1. The aortic valve is thickened and moderately calcified. Aortic valve  regurgitation is mild. Paradoxical Severe Low flow low gradient aortic  stenosis. Aortic regurgitation PHT measures 549 msec. Aortic valve area,  by VTI measures 0.67 cm. Aortic  valve mean gradient measures 31.8 mmHg. Aortic valve Vmax 3.53 m/s, SVI  28, DI 0.18, Indexed AVA 0.29.   2. Left ventricular ejection fraction, by estimation, is 60 to 65%. The  left ventricle has normal function. The left ventricle has no regional  wall motion abnormalities. There is mild concentric left ventricular  hypertrophy. Left ventricular diastolic  parameters are indeterminate.   3. Right ventricular systolic function is normal. The right ventricular  size is normal.   4. The mitral valve is degenerative. No evidence of mitral valve  regurgitation. No evidence of mitral stenosis.   5. The inferior vena cava is normal in size with greater than 50%  respiratory variability, suggesting right atrial pressure of 3 mmHg.   6. There is mild  dilatation of the ascending aorta, measuring 40 mm.    Physical Exam .   GEN: No acute distress.   Neck: No JVD Cardiac: RRR 3/6 murmu Respiratory: Clear to auscultation bilaterally. GI: Soft, nontender, non-distended  MS: No edema  Left radial catheterization site free of discharge, tenderness, erythema.  Patient Profile    Tony Zhang is a 77 y.o. 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  Assessment & Plan .     Severe paradoxical low-flow low gradient AS Has progressed on serial echocardiograms with mean gradient of 32 mmHg, stroke-volume index of 28, dimensionless index of 0.18, and aortic valve area of 0.67.  Not considered to be a surgical candidate, likely TAVR however CT surgery to see and decide.  He will continue with preoperative assessment.  Will undergo further CTA studies as renal function allows.   CAD status post DES to LAD 2023 Preoperative right left heart catheterization showed severe calcified stenosis of the RCA unchanged from prior in 2023.  Patent mid LAD stent with moderate nonobstructive disease proximally.  Patent left circumflex, patent subclavian with mild stenosis.  Plan for medical management of CAD. Currently on atorvastatin 40 mg, Toprol-XL 100 mg.  Chronic HFpEF Appears to be euvolemic. Continue Jardiance, Toprol-XL, Lasix 20 mg daily.  Pulmonary hypertension Emphysema Mean PA pressure 39 mmHg, transpulmonary gradient 20 mmHg, PVR 4.26 Wood units.  Had preserved LVEF, RV function. On Dulera  PAD/DVT/PE Remains on Coumadin and statin chronically   Diabetes 5.5%.  On Jardiance  CKD Downtrending, 1.74.  2.0 on admission.    For questions or updates, please contact  [facility] Please consult [email or web address] for contact info under        Signed, Abagail Kitchens, PA-C    Personally seen and examined. Agree with above.  TAVR workup as above.  Okay for discharge home.  Cardiac catheterization took place yesterday.  Stable disease.  Medical management.  Resuming Coumadin for prior PE DVT remotely.  Donato Schultz, MD

## 2023-09-29 NOTE — Discharge Summary (Addendum)
Discharge Summary    Patient ID: Tony Zhang MRN: [id number]; DOB: 11/10/1945  Admit date: 09/28/2023 Discharge date: 09/29/2023  PCP:  System, Provider Not In   [facility] Providers Cardiologist:  Thurmon Fair, MD   {  Discharge Diagnoses    Principal Problem:   Nonrheumatic aortic (valve) stenosis Active Problems:   DVT, lower extremity and pulmonary embolism, recurrent   Hyperlipidemia   Peripheral vascular disease (HCC)   CAD (coronary artery disease)   HTN (hypertension)    Diagnostic Studies/Procedures    Right left heart catheterization 09/28/2023  Ost LAD to Mid LAD lesion is 50% stenosed.   Ost RCA to Prox RCA lesion is 80% stenosed.   Prox RCA lesion is 25% stenosed.   Mid RCA lesion is 70% stenosed.   1st Diag lesion is 50% stenosed.   3rd Mrg lesion is 75% stenosed.   Dist RCA lesion is 50% stenosed.   Non-stenotic Mid LAD lesion was previously treated.   Hemodynamic findings consistent with moderate pulmonary hypertension.   1.  Severe calcific stenosis of the RCA, unchanged from the previous cardiac catheterization procedure 2.  Continued patency of the stented segment in the mid LAD with moderate nonobstructive plaquing in the proximal vessel 3.  Patent left circumflex with no high-grade stenosis 4.  Patent left subclavian artery with mild calcified stenosis 5.  Calcified aortic valve leaflets with known severe aortic stenosis by noninvasive assessment 6.  Moderate pulmonary hypertension with mean PA pressure 39 mmHg, transpulmonary gradient 20 mmHg, PVR 4.26 Wood units   Recommendations: Continue TAVR evaluation.  Medical therapy for CAD.  Hydration with repeat labs in the morning, overnight observation.  Echocardiogram 08/16/2023 1. The aortic valve is thickened and moderately calcified. Aortic valve  regurgitation is mild. Paradoxical Severe Low flow low gradient aortic  stenosis. Aortic regurgitation PHT measures 549 msec. Aortic  valve area,  by VTI measures 0.67 cm. Aortic  valve mean gradient measures 31.8 mmHg. Aortic valve Vmax 3.53 m/s, SVI  28, DI 0.18, Indexed AVA 0.29.   2. Left ventricular ejection fraction, by estimation, is 60 to 65%. The  left ventricle has normal function. The left ventricle has no regional  wall motion abnormalities. There is mild concentric left ventricular  hypertrophy. Left ventricular diastolic  parameters are indeterminate.   3. Right ventricular systolic function is normal. The right ventricular  size is normal.   4. The mitral valve is degenerative. No evidence of mitral valve  regurgitation. No evidence of mitral stenosis.   5. The inferior vena cava is normal in size with greater than 50%  respiratory variability, suggesting right atrial pressure of 3 mmHg.   6. There is mild dilatation of the ascending aorta, measuring 40 mm.    _____________   History of Present Illness     Tony Zhang is a 77 y.o. 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  He has had progressive severe paradoxical low-flow low gradient aortic stenosis so was admitted 09/28/2023 for further workup of TAVR.      Hospital Course     Consultants:    Severe paradoxical low-flow low gradient AS Has progressed on serial echocardiograms with mean gradient  of 32 mmHg, stroke-volume index of 28, dimensionless index of 0.18, and aortic valve area of 0.67.  Not considered to be a surgical candidate, likely TAVR however CT surgery to see and decide.  He will continue with preoperative assessment.  Will undergo further CTA studies as renal function allows outpatient.     CAD status post DES to LAD 2023 Preoperative right left heart catheterization showed severe calcified stenosis of the RCA unchanged from prior in 2023.  Patent mid LAD stent with moderate nonobstructive disease proximally.  Patent left circumflex, patent subclavian with mild stenosis.  Plan for medical management of CAD. Currently on atorvastatin 40 mg, Toprol-XL 100 mg.   Chronic HFpEF Appears to be euvolemic. Continue Jardiance, Toprol-XL, Lasix 20 mg daily.   Pulmonary hypertension Emphysema Mean PA pressure 39 mmHg, transpulmonary gradient 20 mmHg, PVR 4.26 Wood units.  Had preserved LVEF, RV function. On Dulera   PAD/DVT/PE Remains on Coumadin, repatha, fenofibrate, pletal, statin chronically. INR 1.1 today. Continue titration outpatient. No need for lovonox bridge per MD with recent cath.    Diabetes 5.5%.  On Jardiance   CKD Downtrending, 1.74.  2.0 on admission.   Patient seen and examined in with Dr. Anne Fu and deemed stable for discharge.  Follow-up has been in place.  Left radial site free of any acute complications.  He will continue to follow-up outpatient for his TAVR workup.  Did the patient have an acute coronary syndrome (MI, NSTEMI, STEMI, etc) this admission?:  No                               Did the patient have a percutaneous coronary intervention (stent / angioplasty)?:  No.          _____________  Discharge Vitals Blood pressure (!) 144/53, pulse 66, temperature 98.1 F (36.7 C), temperature source Oral, resp. rate 18, height 6\' 1"  (1.854 m), weight 104.3 kg, SpO2 93%.  Filed Weights   09/28/23 0629  Weight: 104.3 kg    Labs & Radiologic Studies    CBC Recent Labs    09/28/23 1118  HGB 15.0  15.3  HCT 44.0  45.0   Basic Metabolic Panel Recent Labs    40/98/11 1118 09/29/23 0303  NA 144  143 139  K 4.0  4.0 3.9  CL  --  108  CO2  --  23  GLUCOSE  --  91  BUN  --  21  CREATININE  --  1.74*  CALCIUM  --  8.6*   Liver Function  Tests No results for input(s): "AST", "ALT", "ALKPHOS", "BILITOT", "PROT", "ALBUMIN" in the last 72 hours. No results for input(s): "LIPASE", "AMYLASE" in the last 72 hours. High Sensitivity Troponin:   No results for input(s): "TROPONINIHS" in the last 720 hours.  BNP Invalid input(s): "POCBNP" D-Dimer No results for input(s): "DDIMER" in the last 72 hours. Hemoglobin A1C No results for input(s): "HGBA1C" in the last 72 hours. Fasting Lipid Panel No results for input(s): "CHOL", "HDL", "LDLCALC", "TRIG", "CHOLHDL", "LDLDIRECT" in the last 72 hours. Thyroid Function Tests No results for input(s): "TSH", "T4TOTAL", "T3FREE", "THYROIDAB" in the last 72 hours.  Invalid input(s): "FREET3" _____________  CARDIAC CATHETERIZATION Result Date: 09/28/2023   Ost LAD to Mid LAD lesion is 50% stenosed.   Ost RCA to Prox RCA lesion is 80% stenosed.   Prox RCA lesion is 25% stenosed.   Mid RCA lesion is 70% stenosed.  1st Diag lesion is 50% stenosed.   3rd Mrg lesion is 75% stenosed.   Dist RCA lesion is 50% stenosed.   Non-stenotic Mid LAD lesion was previously treated.   Hemodynamic findings consistent with moderate pulmonary hypertension. 1.  Severe calcific stenosis of the RCA, unchanged from the previous cardiac catheterization procedure 2.  Continued patency of the stented segment in the mid LAD with moderate nonobstructive plaquing in the proximal vessel 3.  Patent left circumflex with no high-grade stenosis 4.  Patent left subclavian artery with mild calcified stenosis 5.  Calcified aortic valve leaflets with known severe aortic stenosis by noninvasive assessment 6.  Moderate pulmonary hypertension with mean PA pressure 39 mmHg, transpulmonary gradient 20 mmHg, PVR 4.26 Wood units Recommendations: Continue TAVR evaluation.  Medical therapy for CAD.  Hydration with repeat labs in the morning, overnight observation.   Disposition   Pt is being discharged home today in good condition.  Follow-up  Plans & Appointments     Follow-up Information     Croitoru, Mihai, MD Follow up.   Specialty: Cardiology Why: Appointment is on 10/05/2023 at 920. Contact information: [address] [place] [address] [phone]                Discharge Instructions     Diet - low sodium heart healthy   Complete by: As directed    Discharge instructions   Complete by: As directed    Radial Site Care Refer to this sheet in the next few weeks. These instructions provide you with information on caring for yourself after your procedure. Your caregiver may also give you more specific instructions. Your treatment has been planned according to current medical practices, but problems sometimes occur. Call your caregiver if you have any problems or questions after your procedure. HOME CARE INSTRUCTIONS You may shower the day after the procedure. Remove the bandage (dressing) and gently wash the site with plain soap and water. Gently pat the site dry.  Do not apply powder or lotion to the site.  Do not submerge the affected site in water for 3 to 5 days.  Inspect the site at least twice daily.  Do not flex or bend the affected arm for 24 hours.  No lifting over 5 pounds (2.3 kg) for 5 days after your procedure.  Do not drive home if you are discharged the same day of the procedure. Have someone else drive you.  You may drive 24 hours after the procedure unless otherwise instructed by your caregiver.  What to expect: Any bruising will usually fade within 1 to 2 weeks.  Blood that collects in the tissue (hematoma) may be painful to the touch. It should usually decrease in size and tenderness within 1 to 2 weeks.  SEEK IMMEDIATE MEDICAL CARE IF: You have unusual pain at the radial site.  You have redness, warmth, swelling, or pain at the radial site.  You have drainage (other than a small amount of blood on the dressing).  You have chills.  You have a fever or persistent symptoms  for more than 72 hours.  You have a fever and your symptoms suddenly get worse.  Your arm becomes pale, cool, tingly, or numb.  You have heavy bleeding from the site. Hold pressure on the site.   Increase activity slowly   Complete by: As directed         Discharge Medications   Allergies as of 09/29/2023       Reactions  Codeine Hives, Other (See Comments)   Takes benadryl to stop allergic reactions        Medication List     TAKE these medications    albuterol 108 (90 Base) MCG/ACT inhaler Commonly known as: VENTOLIN HFA Inhale 2 puffs into the lungs in the morning and at bedtime.   allopurinol 300 MG tablet Commonly known as: ZYLOPRIM Take 300 mg by mouth at bedtime.   atorvastatin 40 MG tablet Commonly known as: LIPITOR Take 1 tablet (40 mg total) by mouth daily.   budesonide-formoterol 160-4.5 MCG/ACT inhaler Commonly known as: Symbicort Inhale 2 puffs into the lungs in the morning and at bedtime.   cilostazol 100 MG tablet Commonly known as: PLETAL Take 100 mg by mouth daily.   cyanocobalamin 1000 MCG tablet Take 1 tablet (1,000 mcg total) by mouth daily.   empagliflozin 10 MG Tabs tablet Commonly known as: JARDIANCE Take 1 tablet (10 mg total) by mouth daily.   Fenofibric Acid 135 MG Cpdr TAKE 1 TABLET BY MOUTH DAILY   furosemide 20 MG tablet Commonly known as: LASIX Take 1 tablet (20 mg total) by mouth daily.   gabapentin 300 MG capsule Commonly known as: NEURONTIN Take 1 capsule (300 mg total) by mouth 2 (two) times daily.   Lokelma 10 g Pack packet Generic drug: sodium zirconium cyclosilicate Take 10 g by mouth 3 (three) times daily. Take 1 packet three times a Day for 48 Hours   metoprolol succinate 100 MG 24 hr tablet Commonly known as: TOPROL-XL Take 1 tablet (100 mg total) by mouth daily.   nitroGLYCERIN 0.3 MG SL tablet Commonly known as: NITROSTAT Place under tongue. May take every 5 minutes up to 3 doses for chest pain. If  still having pain after 3 doses, call 911.   ondansetron 8 MG tablet Commonly known as: ZOFRAN Take 8 mg by mouth 2 (two) times daily as needed for vomiting or nausea.   pyridOXINE 50 MG tablet Commonly known as: B-6 Take 1 tablet (50 mg total) by mouth daily.   Repatha SureClick 140 MG/ML Soaj Generic drug: Evolocumab Inject 140 mg into the skin every 14 (fourteen) days.   tamsulosin 0.4 MG Caps capsule Commonly known as: FLOMAX Take 0.4 mg by mouth every evening.   warfarin 2 MG tablet Commonly known as: COUMADIN Take as directed. If you are unsure how to take this medication, talk to your nurse or doctor. Original instructions: Take 2-4 mg by mouth See admin instructions. Take 2 tablets (4mg ) by mouth Saturday and Sunday, then take 1 tablet (2mg ) all other days           Outstanding Labs/Studies    Duration of Discharge Encounter   Greater than 30 minutes including physician time.  Signed, Abagail Kitchens, PA-C 09/29/2023, 10:05 AM  Personally seen and examined. Agree with above.  77 year old with severe aortic stenosis low-flow low gradient with cardiac catheterization performed yesterday by Dr. Excell Seltzer demonstrating severe calcified RCA unchanged from 2023 with patent mid LAD stent and moderate nonobstructive disease proximally.  Has patent subclavian with mild stenosis. -Medical management of CAD, atorvastatin 40 mg Toprol 100 mg  Jardiance, Toprol, Lasix 20 mg a day for HFpEF  Mean pulmonary artery pressure 35 mmHg.  Dulera  PAD DVT former PE-restarted Coumadin.  Does not require Lovenox bridge.  Did not have bridge prior to cardiac catheterization.  Okay for discharge home with continued workup in the future for TAVR.  Appreciate structural team.  Donato Schultz,  MD

## 2023-09-29 NOTE — Plan of Care (Signed)
  Problem: Education: Goal: Understanding of CV disease, CV risk reduction, and recovery process will improve Outcome: Adequate for Discharge   Problem: Activity: Goal: Ability to return to baseline activity level will improve Outcome: Adequate for Discharge   Problem: Cardiovascular: Goal: Ability to achieve and maintain adequate cardiovascular perfusion will improve Outcome: Adequate for Discharge   Problem: Health Behavior/Discharge Planning: Goal: Ability to safely manage health-related needs after discharge will improve Outcome: Adequate for Discharge   Problem: Education: Goal: Knowledge of General Education information will improve Description: Including pain rating scale, medication(s)/side effects and non-pharmacologic comfort measures Outcome: Adequate for Discharge   Problem: Elimination: Goal: Will not experience complications related to bowel motility Outcome: Adequate for Discharge   Problem: Pain Management: Goal: General experience of comfort will improve Outcome: Adequate for Discharge

## 2023-09-30 ENCOUNTER — Other Ambulatory Visit: Payer: Self-pay

## 2023-09-30 DIAGNOSIS — I350 Nonrheumatic aortic (valve) stenosis: Secondary | ICD-10-CM

## 2023-10-04 ENCOUNTER — Encounter: Payer: Self-pay | Admitting: Physician Assistant

## 2023-10-05 ENCOUNTER — Ambulatory Visit: Payer: Medicare Other | Attending: Cardiovascular Disease | Admitting: Cardiovascular Disease

## 2023-10-05 ENCOUNTER — Encounter: Payer: Self-pay | Admitting: Cardiovascular Disease

## 2023-10-05 VITALS — BP 137/60 | HR 73 | Ht 73.0 in | Wt 230.2 lb

## 2023-10-05 DIAGNOSIS — I7143 Infrarenal abdominal aortic aneurysm, without rupture: Secondary | ICD-10-CM

## 2023-10-05 DIAGNOSIS — N1832 Chronic kidney disease, stage 3b: Secondary | ICD-10-CM

## 2023-10-05 DIAGNOSIS — I7122 Aneurysm of the aortic arch, without rupture: Secondary | ICD-10-CM

## 2023-10-05 DIAGNOSIS — E7800 Pure hypercholesterolemia, unspecified: Secondary | ICD-10-CM

## 2023-10-05 DIAGNOSIS — E7841 Elevated Lipoprotein(a): Secondary | ICD-10-CM

## 2023-10-05 DIAGNOSIS — I350 Nonrheumatic aortic (valve) stenosis: Secondary | ICD-10-CM | POA: Diagnosis not present

## 2023-10-05 DIAGNOSIS — Z79899 Other long term (current) drug therapy: Secondary | ICD-10-CM | POA: Diagnosis not present

## 2023-10-05 DIAGNOSIS — I5032 Chronic diastolic (congestive) heart failure: Secondary | ICD-10-CM | POA: Diagnosis not present

## 2023-10-05 DIAGNOSIS — I2510 Atherosclerotic heart disease of native coronary artery without angina pectoris: Secondary | ICD-10-CM

## 2023-10-05 DIAGNOSIS — I739 Peripheral vascular disease, unspecified: Secondary | ICD-10-CM

## 2023-10-05 DIAGNOSIS — I10 Essential (primary) hypertension: Secondary | ICD-10-CM

## 2023-10-05 LAB — BASIC METABOLIC PANEL
BUN/Creatinine Ratio: 13 (ref 10–24)
BUN: 24 mg/dL (ref 8–27)
CO2: 19 mmol/L — ABNORMAL LOW (ref 20–29)
Calcium: 8.9 mg/dL (ref 8.6–10.2)
Chloride: 104 mmol/L (ref 96–106)
Creatinine, Ser: 1.89 mg/dL — ABNORMAL HIGH (ref 0.76–1.27)
Glucose: 103 mg/dL — ABNORMAL HIGH (ref 70–99)
Potassium: 5 mmol/L (ref 3.5–5.2)
Sodium: 140 mmol/L (ref 134–144)
eGFR: 36 mL/min/{1.73_m2} — ABNORMAL LOW (ref >59)

## 2023-10-05 MED ORDER — ATORVASTATIN CALCIUM 20 MG PO TABS: 20.0000 mg | ORAL_TABLET | Freq: Every day | ORAL | 3 refills | Status: DC

## 2023-10-05 NOTE — Patient Instructions (Signed)
Medication Instructions:  Decrease Atorvastatin to 20 *If you need a refill on your cardiac medications before your next appointment, please call your pharmacy*   Lab Work: BMP If you have labs (blood work) drawn today and your tests are completely normal, you will receive your results only by: MyChart Message (if you have MyChart) OR A paper copy in the mail If you have any lab test that is abnormal or we need to change your treatment, we will call you to review the results.  Follow-Up: At [facility], you and your health needs are our priority.  As part of our continuing mission to provide you with exceptional heart care, we have created designated Provider Care Teams.  These Care Teams include your primary Cardiologist (physician) and Advanced Practice Providers (APPs -  Physician Assistants and Nurse Practitioners) who all work together to provide you with the care you need, when you need it.  We recommend signing up for the patient portal called "MyChart".  Sign up information is provided on this After Visit Summary.  MyChart is used to connect with patients for Virtual Visits (Telemedicine).  Patients are able to view lab/test results, encounter notes, upcoming appointments, etc.  Non-urgent messages can be sent to your provider as well.   To learn more about what you can do with MyChart, go to [email or web address].    Your next appointment:   1 year(s)  Provider:   Thurmon Fair, MD

## 2023-10-05 NOTE — Progress Notes (Signed)
Cardiology Office Note:    Date:  10/05/2023   ID:  Tony Zhang, DOB 1946-08-13, MRN [id number]  PCP:  System, Provider Not In    [facility] Providers Cardiologist:  Thurmon Fair, MD     Referring MD: [name], MD (retired, now sees Dr "Rosalia Hammers" in [place]).  No chief complaint on file.   History of Present Illness:    Tony Zhang is a 77 y.o. 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  He is on antiplatelet therapy with clopidogrel but aspirin has been stopped since he also takes warfarin for venous thromboembolic disease.  He has just had slow gradual worsening of exertional dyspnea and remains pretty sedentary.  Continues to work at the [employer] on the weekends and has noticed he is more short winded there.  Limited more by neuropathy and dyspnea.  Has not had chest pain or syncope.  He has not had any falls or bleeding problems and is on warfarin anticoagulation.  He is not on antiplatelet agents  He saw Dr. Excell Seltzer in clinic in early November and the decision was made to start the workup for TAVR.  He underwent right and left heart catheterization which showed unchanged severe calcific ostial right coronary artery stenosis and scattered moderate disease elsewhere.  The decision was made to treat this medically.  Procedure was performed via left radial approach showing a patent left subclavian artery with some atherosclerotic disease, but without severe stenosis.  The aortic valve was not  crossed.  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  His GI problems have subsided somewhat.  His creatinine was 1.74 on 09/29/2023, the day after his cardiac catheterization.  This was an improvement over recent creatinine levels that have been around 2.0 and similar to his previous baseline in May of this year.  Will be rechecking labs today.  He has good metabolic control with a hemoglobin A1c of 5.5% and LDL cholesterol 31.  Still has borderline HDL 39 and triglycerides 172.  Blood pressure is generally higher in the right arm by about 20 mmHg and is currently well-controlled.  His blood pressure was a little high when he first checked him, but was quite normal when I rechecked it a few minutes later.   Past Medical History:  Diagnosis Date   AAA (abdominal aortic aneurysm) (HCC)    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     CAD (coronary artery disease)    COPD (chronic obstructive pulmonary disease) (HCC)    DJD (degenerative joint disease)    Dyslipidemia    Gout    Malignant hypertension    Peripheral vascular disease (HCC)    Pneumonia 08/18/2013   pt. reports that he is having this surgery 11/25/2014- for the lung problem that began with pneumonia in 09/2014   Severe aortic stenosis    Venous thrombosis  Recurrent    Past Surgical History:  Procedure Laterality Date   APPENDECTOMY     BACK SURGERY     COLONOSCOPY N/A 04/13/2015   Procedure: COLONOSCOPY;  Surgeon: Carman Ching, MD;  Location: [facility] ENDOSCOPY;  Service: Endoscopy;  Laterality: N/A;   CORONARY STENT INTERVENTION N/A 06/04/2022   Procedure: CORONARY STENT INTERVENTION;  Surgeon: Corky Crafts, MD;  Location: [facility] INVASIVE CV LAB;  Service: Cardiovascular;  Laterality: N/A;   EMPYEMA  DRAINAGE N/A 11/25/2014   Procedure: EMPYEMA DRAINAGE;  Surgeon: Delight Ovens, MD;  Location: [facility] OR;  Service: Thoracic;  Laterality: N/A;   ESOPHAGOGASTRODUODENOSCOPY N/A 04/10/2015   Procedure: ESOPHAGOGASTRODUODENOSCOPY (EGD);  Surgeon: Charlott Rakes, MD;  Location: [facility] ENDOSCOPY;  Service: Endoscopy;  Laterality: N/A;   EYE SURGERY     both eyes, cataracts removed, denies lens implants   FLEXIBLE SIGMOIDOSCOPY N/A 04/12/2015   Procedure: FLEXIBLE SIGMOIDOSCOPY;  Surgeon: Carman Ching, MD;  Location: [facility] ENDOSCOPY;  Service: Endoscopy;  Laterality: N/A;   HERNIA REPAIR     LEFT HEART CATH AND CORONARY ANGIOGRAPHY N/A 06/04/2022   Procedure: LEFT HEART CATH AND CORONARY ANGIOGRAPHY;  Surgeon: Corky Crafts, MD;  Location: [facility] INVASIVE CV LAB;  Service: Cardiovascular;  Laterality: N/A;   RIGHT HEART CATH AND CORONARY ANGIOGRAPHY N/A 09/28/2023   Procedure: RIGHT HEART CATH AND CORONARY ANGIOGRAPHY;  Surgeon: Tonny Bollman, MD;  Location: [facility] INVASIVE CV LAB;  Service: Cardiovascular;  Laterality: N/A;   SHOULDER SURGERY Right    VIDEO ASSISTED THORACOSCOPY Right 11/25/2014   Procedure: VIDEO ASSISTED THORACOSCOPY;  Surgeon: Delight Ovens, MD;  Location: [facility] OR;  Service: Thoracic;  Laterality: Right;   VIDEO BRONCHOSCOPY N/A 11/25/2014   Procedure: VIDEO BRONCHOSCOPY;  Surgeon: Delight Ovens, MD;  Location: [facility] OR;  Service: Thoracic;  Laterality: N/A;    Current Medications: Current Meds  Medication Sig   albuterol (VENTOLIN HFA) 108 (90 Base) MCG/ACT inhaler Inhale 2 puffs into the lungs in the morning and at bedtime.   allopurinol (ZYLOPRIM) 300 MG tablet Take 300 mg by mouth at bedtime.   budesonide-formoterol (SYMBICORT) 160-4.5 MCG/ACT inhaler Inhale 2 puffs into the lungs in the morning and at bedtime.   Choline Fenofibrate (FENOFIBRIC ACID) 135 MG CPDR TAKE 1 TABLET BY MOUTH DAILY   cilostazol (PLETAL) 100 MG tablet Take 100 mg by mouth daily.   cyanocobalamin 1000  MCG tablet Take 1 tablet (1,000 mcg total) by mouth daily.   empagliflozin (JARDIANCE) 10 MG TABS tablet Take 1 tablet (10 mg total) by mouth daily.   Evolocumab (REPATHA SURECLICK) 140 MG/ML SOAJ Inject 140 mg into the skin every 14 (fourteen) days.   furosemide (LASIX) 20 MG tablet Take 1 tablet (20 mg total) by mouth daily.   gabapentin (NEURONTIN) 300 MG capsule Take 1 capsule (300 mg total) by mouth 2 (two) times daily.   metoprolol succinate (TOPROL-XL) 100 MG 24 hr tablet Take 1 tablet (100 mg total) by mouth daily.   pyridOXINE (B-6) 50 MG tablet Take 1 tablet (50 mg total) by mouth daily.   sodium zirconium cyclosilicate (LOKELMA) 10 g PACK packet Take 10 g by mouth 3 (three) times daily. Take 1 packet three times a Day for 48 Hours   tamsulosin (FLOMAX) 0.4 MG CAPS capsule Take 0.4 mg by mouth every evening.   warfarin (COUMADIN) 2 MG tablet Take 2-4 mg by mouth See admin instructions. Take 2 tablets (4mg ) by mouth Saturday and Sunday, then take 1 tablet (2mg ) all  other days   [DISCONTINUED] atorvastatin (LIPITOR) 40 MG tablet Take 1 tablet (40 mg total) by mouth daily.     Allergies:   Codeine   Social History   Socioeconomic History   Marital status: Widowed    Spouse name: Not on file   Number of children: 0   Years of education: Not on file   Highest education level: 6th grade  Occupational History    Comment: semi retired  Tobacco Use   Smoking status: Former    Current packs/day: 0.00    Types: Cigarettes    Quit date: 10/18/1992    Years since quitting: 30.9   Smokeless tobacco: Never  Vaping Use   Vaping status: Never Used  Substance and Sexual Activity   Alcohol use: No   Drug use: No   Sexual activity: Not on file  Other Topics Concern   Not on file  Social History Narrative   Not on file   Social Drivers of Health   Financial Resource Strain: Low Risk  (06/04/2022)   Overall Financial Resource Strain (CARDIA)    Difficulty of Paying Living Expenses:  Not hard at all  Food Insecurity: No Food Insecurity (09/28/2023)   Hunger Vital Sign    Worried About Running Out of Food in the Last Year: Never true    Ran Out of Food in the Last Year: Never true  Transportation Needs: No Transportation Needs (09/28/2023)   PRAPARE - Administrator, Civil Service (Medical): No    Lack of Transportation (Non-Medical): No  Physical Activity: Not on file  Stress: Not on file  Social Connections: Not on file     Family History: The patient's family history includes CAD in his sister; CAD (age of onset: 78) in his father; Liver cancer in his brother.  ROS:   Please see the history of present illness.     All other systems reviewed and are negative.  EKGs/Labs/Other Studies Reviewed:    The following studies were reviewed today:  CT angiogram of the aorta  1. No evidence of acute aortic syndrome. 2. 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 3. 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 Recommend annual imaging followup by CTA or MRA. This recommendation follows 2010 ACCF/AHA/AATS/ACR/ASA/SCA/SCAI/SIR/STS/SVM Guidelines for the Diagnosis and Management of Patients with Thoracic Aortic Disease. Circulation.2010; 121: Z610-R604. Aortic aneurysm NOS (ICD10-I71.9) 4.  Coronary and aortic Atherosclerosis (ICD10-I70.0).   Echocardiogram 08/16/2023   1. The aortic valve is thickened and moderately calcified. Aortic valve  regurgitation is mild. Paradoxical Severe Low flow low gradient aortic  stenosis. Aortic regurgitation PHT measures 549 msec. Aortic valve area,  by VTI measures 0.67 cm. Aortic  valve mean gradient measures 31.8 mmHg. Aortic valve Vmax 3.53 m/s, SVI  28, DI 0.18, Indexed AVA 0.29.   2. Left ventricular ejection fraction, by estimation, is 60 to 65%. The  left ventricle has normal function. The left ventricle  has no regional  wall motion abnormalities. There is mild concentric left ventricular  hypertrophy. Left ventricular diastolic  parameters are indeterminate.   3. Right ventricular systolic function is normal. The right ventricular  size is normal.   4. The mitral valve is degenerative. No evidence of mitral valve  regurgitation. No evidence of mitral stenosis.   5. The inferior vena cava is normal in size with greater than 50%  respiratory variability, suggesting right atrial pressure of 3  mmHg.   6. There is mild dilatation of the ascending aorta, measuring 40 mm.    Personally reviewed ECG from 09/28/2023 which shows normal sinus rhythm, incomplete right bundle branch block left anterior fascicular block, QS pattern in leads V1-V2, QTc 422 ms EKG:  Personally reviewed ECG from 09/28/2023 which shows normal sinus rhythm, incomplete right bundle branch block left anterior fascicular block, QS pattern in leads V1-V2, QTc 422 ms  EKG Interpretation Date/Time:    Ventricular Rate:    PR Interval:    QRS Duration:    QT Interval:    QTC Calculation:   R Axis:      Text Interpretation:          Cardiac catheterization 09/28/2023    Ost LAD to Mid LAD lesion is 50% stenosed.   Ost RCA to Prox RCA lesion is 80% stenosed.   Prox RCA lesion is 25% stenosed.   Mid RCA lesion is 70% stenosed.   1st Diag lesion is 50% stenosed.   3rd Mrg lesion is 75% stenosed.   Dist RCA lesion is 50% stenosed.   Non-stenotic Mid LAD lesion was previously treated.   Hemodynamic findings consistent with moderate pulmonary hypertension.   1.  Severe calcific stenosis of the RCA, unchanged from the previous cardiac catheterization procedure 2.  Continued patency of the stented segment in the mid LAD with moderate nonobstructive plaquing in the proximal vessel 3.  Patent left circumflex with no high-grade stenosis 4.  Patent left subclavian artery with mild calcified stenosis 5.  Calcified aortic  valve leaflets with known severe aortic stenosis by noninvasive assessment 6.  Moderate pulmonary hypertension with mean PA pressure 39 mmHg, transpulmonary gradient 20 mmHg, PVR 4.26 Wood units   Recommendations: Continue TAVR evaluation.  Medical therapy for CAD.  Hydration with repeat labs in the morning, overnight observation.   Recent Labs: 02/25/2023: B Natriuretic Peptide 370.4 02/27/2023: TSH 0.531 02/28/2023: Magnesium 1.9 09/19/2023: Platelets 212 09/22/2023: ALT 13 09/28/2023: Hemoglobin 15.3; Hemoglobin 15.0 09/29/2023: BUN 21; Creatinine, Ser 1.74; Potassium 3.9; Sodium 139  Recent Lipid Panel    Component Value Date/Time   CHOL 98 (L) 09/22/2023 0947   TRIG 172 (H) 09/22/2023 0947   HDL 39 (L) 09/22/2023 0947   CHOLHDL 2.5 09/22/2023 0947   CHOLHDL 3.9 06/03/2022 0257   VLDL 19 06/03/2022 0257   LDLCALC 31 09/22/2023 0947     Risk Assessment/Calculations:                Physical Exam:    VS:  BP 137/60   Pulse 73   Ht 6\' 1"  (1.854 m)   Wt 230 lb 3.2 oz (104.4 kg)   SpO2 90%   BMI 30.37 kg/m     Wt Readings from Last 3 Encounters:  10/05/23 230 lb 3.2 oz (104.4 kg)  09/28/23 230 lb (104.3 kg)  08/25/23 230 lb 6.4 oz (104.5 kg)      General: Alert, oriented x3, no distress, appears comfortable Head: no evidence of trauma, PERRL, EOMI, no exophtalmos or lid lag, no myxedema, no xanthelasma; normal ears, nose and oropharynx Neck: normal jugular venous pulsations and no hepatojugular reflux; brisk carotid pulses without delay and no carotid bruits Chest: clear to auscultation, no signs of consolidation by percussion or palpation, normal fremitus, symmetrical and full respiratory excursions Cardiovascular: normal position and quality of the apical impulse, regular rhythm, normal first  heart sound, but the second heart sound is not audible in the aortic focus ,  no diastolic murmurs, rubs or gallops Abdomen: no tenderness or distention, no masses by palpation,  no abnormal pulsatility or arterial bruits, normal bowel sounds, no hepatosplenomegaly Extremities: No pulses palpable in his lower extremities. Neurological: grossly nonfocal Psych: Normal mood and affect   ASSESSMENT:    1. Aortic stenosis, severe   2. Chronic diastolic heart failure, NYHA class 2 (HCC)   3. Medication management   4. Coronary artery disease involving native coronary artery of native heart without angina pectoris   5. PAD (peripheral artery disease) (HCC)   6. Infrarenal abdominal aortic aneurysm (AAA) without rupture (HCC)   7. 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 Essential hypertension   9. 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 Stage 3b chronic kidney disease (HCC)      PLAN:    In order of problems listed above:  AS: He has severe paradoxical low-flow low gradient aortic stenosis.  Workup proceeding for TAVR.  Has CT scan scheduled 10/10/2023.  Recheck labs today.  Will need alternative access, most likely via left carotid to subclavian artery.   CAD: Denies angina pectoris.  He has a high-grade stenosis in the proximal right coronary artery that is difficult for percutaneous revascularization.  Since he is asymptomatic we will continue with medical therapy.  No longer on antiplatelet agents since he is fully anticoagulated with warfarin.  Had a drug-eluting stent placed for ACS in the LAD artery in late August 2023.,  Patent by the recent cath PAD/chronic occlusion of the infrarenal abdominal aorta: His leg pain sounds more neurogenic than vascular.  He does not have claudication, may be because he is fairly sedentary.  Longstanding history of occlusion of the distal abdominal aorta with reconstitution at the level of the common femoral arteries via inferior epigastric arteries.  Based on previous imaging studies does not have a whole lot of disease in the infrapopliteal distribution.   Aortic arch aneurysm/suprarenal AAA: Will reevaluate  his aortic anatomy by CT next week.  Has a stable fusiform aneurysm of the distal aortic arch at 3.9 cm and a suprarenal abdominal aortic aneurysm 3.5 cm.  They both are appropriate for routine imaging follow-up on a yearly basis.  They do not require surgery at this time. CHF: NYHA functional class II/IIIA.  Normal left ventricular systolic function.  He is clinically euvolemic, on Jardiance, spironolactone, ARB, low-dose loop diuretic HTN: Not ideal control, but until we fix his aortic valve would avoid adding more medications. HLP: Excellent response to Repatha, all lipid parameters are acceptable.  Note that he has a severely elevated LP(a).  May be a candidate for inclisiran. History of DVT/PE: Incidentally noted to have a persistent left-sided inferior vena cava.  On chronic warfarin for over 20 years.  Was seeing Dr. Truett Perna in the hematology clinic for hypercoagulable state.  INR is monitored in [place].  Avoid NSAIDs.  Prefer acetaminophen or tramadol.  He prefers not to take oxycodone since this makes him feel unwell. CKD3B: Renal function has worsened a little bit this year, baseline now seems to be around 1.6 (GFR 40).  Reached on labs today. Neuropathy: "Like walking on glass".  Improved neuropathy in his feet after starting gabapentin. History of gout: Avoid thiazide diuretics.            Medication Adjustments/Labs and Tests Ordered: Current medicines are reviewed at length with the patient today.  Concerns regarding medicines are outlined above.  Orders Placed This Encounter  Procedures  Basic metabolic panel   Meds ordered this encounter  Medications   atorvastatin (LIPITOR) 20 MG tablet    Sig: Take 1 tablet (20 mg total) by mouth daily.    Dispense:  90 tablet    Refill:  3    Patient Instructions  Medication Instructions:  Decrease Atorvastatin to 20 *If you need a refill on your cardiac medications before your next appointment, please call your  pharmacy*   Lab Work: BMP If you have labs (blood work) drawn today and your tests are completely normal, you will receive your results only by: MyChart Message (if you have MyChart) OR A paper copy in the mail If you have any lab test that is abnormal or we need to change your treatment, we will call you to review the results.  Follow-Up: At [facility], you and your health needs are our priority.  As part of our continuing mission to provide you with exceptional heart care, we have created designated Provider Care Teams.  These Care Teams include your primary Cardiologist (physician) and Advanced Practice Providers (APPs -  Physician Assistants and Nurse Practitioners) who all work together to provide you with the care you need, when you need it.  We recommend signing up for the patient portal called "MyChart".  Sign up information is provided on this After Visit Summary.  MyChart is used to connect with patients for Virtual Visits (Telemedicine).  Patients are able to view lab/test results, encounter notes, upcoming appointments, etc.  Non-urgent messages can be sent to your provider as well.   To learn more about what you can do with MyChart, go to [email or web address].    Your next appointment:   1 year(s)  Provider:   Thurmon Fair, MD           Signed, Thurmon Fair, MD  10/05/2023 12:25 PM    [facility]

## 2023-10-10 ENCOUNTER — Ambulatory Visit (HOSPITAL_COMMUNITY)
Admission: RE | Admit: 2023-10-10 | Discharge: 2023-10-10 | Disposition: A | Discharge status: Home / Self Care | Payer: Medicare Other | Source: Ambulatory Visit | Attending: Cardiovascular Disease | Admitting: Cardiovascular Disease

## 2023-10-10 ENCOUNTER — Ambulatory Visit (HOSPITAL_BASED_OUTPATIENT_CLINIC_OR_DEPARTMENT_OTHER)
Admission: RE | Admit: 2023-10-10 | Discharge: 2023-10-10 | Disposition: A | Discharge status: Home / Self Care | Payer: Medicare Other | Source: Ambulatory Visit | Attending: Cardiovascular Disease | Admitting: Cardiovascular Disease

## 2023-10-10 DIAGNOSIS — I350 Nonrheumatic aortic (valve) stenosis: Secondary | ICD-10-CM

## 2023-10-10 MED ORDER — SODIUM CHLORIDE 0.9 % WEIGHT BASED INFUSION: 1.0000 mL/kg/h | INTRAVENOUS | Status: DC

## 2023-10-10 MED ORDER — SODIUM CHLORIDE 0.9 % WEIGHT BASED INFUSION
3.0000 mL/kg/h | INTRAVENOUS | Status: AC
  Administered 2023-10-10: 3 mL/kg/h via INTRAVENOUS

## 2023-10-10 MED ORDER — IOHEXOL 350 MG/ML SOLN
95.0000 mL | Freq: Once | INTRAVENOUS | Status: AC | PRN
Start: 2023-10-10 — End: 2023-10-10
  Administered 2023-10-10: 95 mL via INTRAVENOUS

## 2023-10-10 NOTE — Progress Notes (Signed)
Called CT, spoke with Chelsie, and let her know he was in room 3 and would be ready at 9:23 for CT

## 2023-10-17 ENCOUNTER — Telehealth: Payer: Self-pay | Admitting: Cardiovascular Disease

## 2023-10-17 NOTE — Telephone Encounter (Signed)
Thx. As above, this is known. Appreciate the notification.

## 2023-10-17 NOTE — Telephone Encounter (Signed)
Call from [place] at [facility] radiology. Tony Zhang states that on CTA chest, a chronic occlusion of the infrarenal abdominal aorta and common iliac artery was seen. 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

## 2023-10-17 NOTE — Telephone Encounter (Signed)
Ruby calling to add to this report

## 2023-10-17 NOTE — Telephone Encounter (Signed)
Received call from operator, Ruby from [facility] radiology had an addendum to add to previous CT from 12/23:  ADDENDUM REPORT: 10/17/2023 12:57   ADDENDUM: New endobronchial filling defect in the right lower lobe that could represent mucous plugging but indeterminate. Consider 6-8 week follow-up CT to ensure resolution.     Electronically Signed   By: Richarda Overlie M.D.   On: 10/17/2023 12:57

## 2023-10-17 NOTE — Telephone Encounter (Signed)
Follow Up:    Tony Zhang is calling with results.

## 2023-10-17 NOTE — Telephone Encounter (Signed)
Patient w/ TAVR work up, has CTS appt on 10/27/23. He also saw Dr. Royann Shivers on 10/05/23 where chronic occlusion of infrarenal abdominal aorta was discussed.

## 2023-10-18 ENCOUNTER — Other Ambulatory Visit: Payer: Self-pay

## 2023-10-18 DIAGNOSIS — I739 Peripheral vascular disease, unspecified: Secondary | ICD-10-CM

## 2023-10-18 DIAGNOSIS — I6521 Occlusion and stenosis of right carotid artery: Secondary | ICD-10-CM

## 2023-10-18 DIAGNOSIS — I350 Nonrheumatic aortic (valve) stenosis: Secondary | ICD-10-CM

## 2023-10-20 ENCOUNTER — Ambulatory Visit (HOSPITAL_COMMUNITY)
Admission: RE | Admit: 2023-10-20 | Discharge: 2023-10-20 | Disposition: A | Discharge status: Home / Self Care | Payer: Medicare Other | Source: Ambulatory Visit | Attending: Internal Medicine | Admitting: Internal Medicine

## 2023-10-20 DIAGNOSIS — I739 Peripheral vascular disease, unspecified: Secondary | ICD-10-CM | POA: Insufficient documentation

## 2023-10-20 DIAGNOSIS — I350 Nonrheumatic aortic (valve) stenosis: Secondary | ICD-10-CM | POA: Insufficient documentation

## 2023-10-20 DIAGNOSIS — I6521 Occlusion and stenosis of right carotid artery: Secondary | ICD-10-CM | POA: Diagnosis present

## 2023-10-26 NOTE — Progress Notes (Signed)
 [address]       [place] [address]             [phone]           ISAIAH CIANCI [facility] Medical Record #[id number] Date of Birth: [birth year]  Francyne Headland, MD System, Provider Not In  Chief Complaint:   increasing SOB  History of Present Illness:     Pt is a very pleasant 78 yo male who has been having increasing SOB with activity especially when he works at the [employer]. He was evaluated by Dr Wonda in November and TTE revealed EF of 60% and a mean gradient of with an AVA of 0.72cms. He was felt to best be served with AVR and with his comorbidities of severe PVD and occluded distal aorta, he was felt to require alternative access TAVR. Recent carotid duplex has RICA 100# occluded and LICA at 50% and with flow disturbance in LSCA. He underwent cath with moderate CAD felt best to be managed medically. He had evidence of PHTN. Pt has a history of previous stenting of CAD and also a 40 yr history of hypercoagulable state with PE and repeat 20 years ago when he came off coumadin . He reports he has not been on plavix  and was placed on asa then removed from asa by Dr Croituro. He denies syncope or CP.      Past Medical History:  Diagnosis Date   AAA (abdominal aortic aneurysm) (HCC)    Blood dyscrasia    followed by Dr. Cloretta, for clotting issue, has been on Coumadin  for about 20 yrs.     CAD (coronary artery disease)    COPD (chronic obstructive pulmonary disease) (HCC)    DJD (degenerative joint disease)    Dyslipidemia    Gout    Malignant hypertension    Peripheral vascular disease (HCC)    Pneumonia 08/18/2013   pt. reports that he is having this surgery 11/25/2014- for the lung problem that began with pneumonia in 09/2014   Severe aortic stenosis    Venous thrombosis    Recurrent    Past Surgical History:  Procedure Laterality Date   APPENDECTOMY     BACK SURGERY     COLONOSCOPY N/A 04/13/2015   Procedure: COLONOSCOPY;   Surgeon: Lynwood Bohr, MD;  Location: [facility] ENDOSCOPY;  Service: Endoscopy;  Laterality: N/A;   CORONARY STENT INTERVENTION N/A 06/04/2022   Procedure: CORONARY STENT INTERVENTION;  Surgeon: Dann Candyce RAMAN, MD;  Location: [facility] INVASIVE CV LAB;  Service: Cardiovascular;  Laterality: N/A;   EMPYEMA DRAINAGE N/A 11/25/2014   Procedure: EMPYEMA DRAINAGE;  Surgeon: Dallas KATHEE Jude, MD;  Location: [facility] OR;  Service: Thoracic;  Laterality: N/A;   ESOPHAGOGASTRODUODENOSCOPY N/A 04/10/2015   Procedure: ESOPHAGOGASTRODUODENOSCOPY (EGD);  Surgeon: Jerrell Sol, MD;  Location: [facility] ENDOSCOPY;  Service: Endoscopy;  Laterality: N/A;   EYE SURGERY     both eyes, cataracts removed, denies lens implants   FLEXIBLE SIGMOIDOSCOPY N/A 04/12/2015   Procedure: FLEXIBLE SIGMOIDOSCOPY;  Surgeon: Lynwood Bohr, MD;  Location: [facility] ENDOSCOPY;  Service: Endoscopy;  Laterality: N/A;   HERNIA REPAIR     LEFT HEART CATH AND CORONARY ANGIOGRAPHY N/A 06/04/2022   Procedure: LEFT HEART CATH AND CORONARY ANGIOGRAPHY;  Surgeon: Dann Candyce RAMAN, MD;  Location: [facility] INVASIVE CV LAB;  Service: Cardiovascular;  Laterality: N/A;   RIGHT HEART CATH AND CORONARY ANGIOGRAPHY N/A 09/28/2023   Procedure: RIGHT HEART CATH AND CORONARY ANGIOGRAPHY;  Surgeon:  Wonda Sharper, MD;  Location: [facility] INVASIVE CV LAB;  Service: Cardiovascular;  Laterality: N/A;   SHOULDER SURGERY Right    VIDEO ASSISTED THORACOSCOPY Right 11/25/2014   Procedure: VIDEO ASSISTED THORACOSCOPY;  Surgeon: Dallas KATHEE Jude, MD;  Location: [facility] OR;  Service: Thoracic;  Laterality: Right;   VIDEO BRONCHOSCOPY N/A 11/25/2014   Procedure: VIDEO BRONCHOSCOPY;  Surgeon: Dallas KATHEE Jude, MD;  Location: [facility] OR;  Service: Thoracic;  Laterality: N/A;    Social History   Tobacco Use  Smoking Status Former   Current packs/day: 0.00   Types: Cigarettes   Quit date: 10/18/1992   Years since quitting: 31.0  Smokeless Tobacco Never    Social History   Substance and Sexual Activity   Alcohol Use No    Social History   Socioeconomic History   Marital status: Widowed    Spouse name: Not on file   Number of children: 0   Years of education: Not on file   Highest education level: 6th grade  Occupational History    Comment: semi retired  Tobacco Use   Smoking status: Former    Current packs/day: 0.00    Types: Cigarettes    Quit date: 10/18/1992    Years since quitting: 31.0   Smokeless tobacco: Never  Vaping Use   Vaping status: Never Used  Substance and Sexual Activity   Alcohol use: No   Drug use: No   Sexual activity: Not on file  Other Topics Concern   Not on file  Social History Narrative   Not on file   Social Drivers of Health   Financial Resource Strain: Low Risk  (06/04/2022)   Overall Financial Resource Strain (CARDIA)    Difficulty of Paying Living Expenses: Not hard at all  Food Insecurity: No Food Insecurity (09/28/2023)   Hunger Vital Sign    Worried About Running Out of Food in the Last Year: Never true    Ran Out of Food in the Last Year: Never true  Transportation Needs: No Transportation Needs (09/28/2023)   PRAPARE - Administrator, Civil Service (Medical): No    Lack of Transportation (Non-Medical): No  Physical Activity: Not on file  Stress: Not on file  Social Connections: Not on file  Intimate Partner Violence: Not At Risk (09/28/2023)   Humiliation, Afraid, Rape, and Kick questionnaire    Fear of Current or Ex-Partner: No    Emotionally Abused: No    Physically Abused: No    Sexually Abused: No    Allergies  Allergen Reactions   Codeine Hives and Other (See Comments)    Takes benadryl  to stop allergic reactions    Current Outpatient Medications  Medication Sig Dispense Refill   albuterol  (VENTOLIN  HFA) 108 (90 Base) MCG/ACT inhaler Inhale 2 puffs into the lungs in the morning and at bedtime.     allopurinol  (ZYLOPRIM ) 300 MG tablet Take 300 mg by mouth at bedtime.     atorvastatin  (LIPITOR) 20 MG  tablet Take 1 tablet (20 mg total) by mouth daily. 90 tablet 3   budesonide -formoterol  (SYMBICORT ) 160-4.5 MCG/ACT inhaler Inhale 2 puffs into the lungs in the morning and at bedtime. 1 each 12   Choline Fenofibrate  (FENOFIBRIC ACID ) 135 MG CPDR TAKE 1 TABLET BY MOUTH DAILY 90 capsule 3   cilostazol  (PLETAL ) 100 MG tablet Take 100 mg by mouth daily.     cyanocobalamin  1000 MCG tablet Take 1 tablet (1,000 mcg total) by mouth daily. 90 tablet 0  empagliflozin  (JARDIANCE ) 10 MG TABS tablet Take 1 tablet (10 mg total) by mouth daily. 90 tablet 3   Evolocumab  (REPATHA  SURECLICK) 140 MG/ML SOAJ Inject 140 mg into the skin every 14 (fourteen) days. 2 mL 12   furosemide  (LASIX ) 20 MG tablet Take 1 tablet (20 mg total) by mouth daily. 90 tablet 3   gabapentin  (NEURONTIN ) 300 MG capsule Take 1 capsule (300 mg total) by mouth 2 (two) times daily. 60 capsule 2   metoprolol  succinate (TOPROL -XL) 100 MG 24 hr tablet Take 1 tablet (100 mg total) by mouth daily. 90 tablet 3   nitroGLYCERIN  (NITROSTAT ) 0.3 MG SL tablet Place under tongue. May take every 5 minutes up to 3 doses for chest pain. If still having pain after 3 doses, call 911. (Patient not taking: Reported on 10/05/2023) 30 tablet 3   ondansetron  (ZOFRAN ) 8 MG tablet Take 8 mg by mouth 2 (two) times daily as needed for vomiting or nausea. (Patient not taking: Reported on 10/05/2023)     pyridOXINE  (B-6) 50 MG tablet Take 1 tablet (50 mg total) by mouth daily. 90 tablet 0   sodium zirconium cyclosilicate  (LOKELMA ) 10 g PACK packet Take 10 g by mouth 3 (three) times daily. Take 1 packet three times a Day for 48 Hours 6 packet 0   tamsulosin  (FLOMAX ) 0.4 MG CAPS capsule Take 0.4 mg by mouth every evening.     warfarin (COUMADIN ) 2 MG tablet Take 2-4 mg by mouth See admin instructions. Take 2 tablets (4mg ) by mouth Saturday and Sunday, then take 1 tablet (2mg ) all other days     No current facility-administered medications for this visit.     Family  History  Problem Relation Age of Onset   CAD Father 70   Liver cancer Brother    CAD Sister        Physical Exam: Elderly appearing Lungs: distant bs Card: RR with soft systolic murmur Ext: warm Neuro: intact     Diagnostic Studies & Laboratory data: I have personally reviewed the following studies and agree with the findings   TTE (07/2023) IMPRESSIONS     1. The aortic valve is thickened and moderately calcified. Aortic valve  regurgitation is mild. Paradoxical Severe Low flow low gradient aortic  stenosis. Aortic regurgitation PHT measures 549 msec. Aortic valve area,  by VTI measures 0.67 cm. Aortic  valve mean gradient measures 31.8 mmHg. Aortic valve Vmax 3.53 m/s, SVI  28, DI 0.18, Indexed AVA 0.29.   2. Left ventricular ejection fraction, by estimation, is 60 to 65%. The  left ventricle has normal function. The left ventricle has no regional  wall motion abnormalities. There is mild concentric left ventricular  hypertrophy. Left ventricular diastolic  parameters are indeterminate.   3. Right ventricular systolic function is normal. The right ventricular  size is normal.   4. The mitral valve is degenerative. No evidence of mitral valve  regurgitation. No evidence of mitral stenosis.   5. The inferior vena cava is normal in size with greater than 50%  respiratory variability, suggesting right atrial pressure of 3 mmHg.   6. There is mild dilatation of the ascending aorta, measuring 40 mm.   FINDINGS   Left Ventricle: Left ventricular ejection fraction, by estimation, is 60  to 65%. The left ventricle has normal function. The left ventricle has no  regional wall motion abnormalities. The left ventricular internal cavity  size was normal in size. There is   mild concentric left ventricular hypertrophy.  Left ventricular diastolic  parameters are indeterminate.   Right Ventricle: The right ventricular size is normal. No increase in  right ventricular wall  thickness. Right ventricular systolic function is  normal.   Left Atrium: Left atrial size was normal in size.   Right Atrium: Right atrial size was normal in size.   Pericardium: There is no evidence of pericardial effusion. Presence of  epicardial fat layer.   Mitral Valve: The mitral valve is degenerative in appearance. Mild to  moderate mitral annular calcification. No evidence of mitral valve  regurgitation. No evidence of mitral valve stenosis.   Tricuspid Valve: The tricuspid valve is normal in structure. Tricuspid  valve regurgitation is not demonstrated. No evidence of tricuspid  stenosis.   Aortic Valve: The aortic valve is calcified. There is moderate  calcification of the aortic valve. There is moderate thickening of the  aortic valve. Aortic valve regurgitation is mild. Aortic regurgitation PHT  measures 549 msec. Parodoxical Severe Low  flow low gradient aortic stenosis. Aortic valve mean gradient measures  31.8 mmHg. Aortic valve peak gradient measures 49.9 mmHg. Aortic valve  area, by VTI measures 0.67 cm.   Pulmonic Valve: The pulmonic valve was normal in structure. Pulmonic valve  regurgitation is not visualized. No evidence of pulmonic stenosis.   Aorta: There is mild dilatation of the ascending aorta, measuring 40 mm.   Venous: The inferior vena cava is normal in size with greater than 50%  respiratory variability, suggesting right atrial pressure of 3 mmHg.   IAS/Shunts: No atrial level shunt detected by color flow Doppler.     LEFT VENTRICLE  PLAX 2D  LVIDd:         5.05 cm     Diastology  LVIDs:         3.00 cm     LV e' medial:    5.35 cm/s  LV PW:         1.05 cm     LV E/e' medial:  23.4  LV IVS:        1.05 cm     LV e' lateral:   6.49 cm/s  LVOT diam:     2.20 cm     LV E/e' lateral: 19.3  LV SV:         63  LV SV Index:   28  LVOT Area:     3.80 cm    LV Volumes (MOD)  LV vol d, MOD A2C: 87.5 ml  LV vol d, MOD A4C: 96.5 ml  LV vol s,  MOD A2C: 32.5 ml  LV vol s, MOD A4C: 38.5 ml  LV SV MOD A2C:     55.0 ml  LV SV MOD A4C:     96.5 ml  LV SV MOD BP:      55.8 ml   RIGHT VENTRICLE  RV S prime:     10.60 cm/s  TAPSE (M-mode): 1.9 cm   LEFT ATRIUM             Index        RIGHT ATRIUM           Index  LA diam:        5.10 cm 2.26 cm/m   RA Area:     14.80 cm  LA Vol (A2C):   51.2 ml 22.68 ml/m  RA Volume:   34.80 ml  15.41 ml/m  LA Vol (A4C):   58.1 ml 25.73 ml/m  LA Biplane Vol: 56.7  ml 25.11 ml/m   AORTIC VALVE  AV Area (Vmax):    0.84 cm  AV Area (Vmean):   0.72 cm  AV Area (VTI):     0.67 cm  AV Vmax:           353.25 cm/s  AV Vmean:          268.750 cm/s  AV VTI:            0.954 m  AV Peak Grad:      49.9 mmHg  AV Mean Grad:      31.8 mmHg  LVOT Vmax:         78.10 cm/s  LVOT Vmean:        50.700 cm/s  LVOT VTI:          0.167 m  LVOT/AV VTI ratio: 0.18  AI PHT:            549 msec    AORTA  Ao Root diam: 3.20 cm  Ao Asc diam:  3.95 cm   MITRAL VALVE  MV Area (PHT): 3.42 cm     SHUNTS  MV Decel Time: 222 msec     Systemic VTI:  0.17 m  MV E velocity: 125.00 cm/s  Systemic Diam: 2.20 cm  MV A velocity: 118.00 cm/s  MV E/A ratio:  1.06   CATH (09/2023) Conclusion      Ost LAD to Mid LAD lesion is 50% stenosed.   Ost RCA to Prox RCA lesion is 80% stenosed.   Prox RCA lesion is 25% stenosed.   Mid RCA lesion is 70% stenosed.   1st Diag lesion is 50% stenosed.   3rd Mrg lesion is 75% stenosed.   Dist RCA lesion is 50% stenosed.   Non-stenotic Mid LAD lesion was previously treated.   Hemodynamic findings consistent with moderate pulmonary hypertension.   1.  Severe calcific stenosis of the RCA, unchanged from the previous cardiac catheterization procedure 2.  Continued patency of the stented segment in the mid LAD with moderate nonobstructive plaquing in the proximal vessel 3.  Patent left circumflex with no high-grade stenosis 4.  Patent left subclavian artery with mild calcified  stenosis 5.  Calcified aortic valve leaflets with known severe aortic stenosis by noninvasive assessment 6.  Moderate pulmonary hypertension with mean PA pressure 39 mmHg, transpulmonary gradient 20 mmHg, PVR 4.26 Wood units    Recent Radiology Findings:   CTA (09/2023) FINDINGS: Aortic Valve: Tri leaflet calcified with restricted leaflet motion Calcium  score 2371   Aorta: Normal arch vessels no aneurysm Severe calcific atherosclerosis with soft plaque in the arch   Sino-tubular Junction: 28 mm   Ascending Thoracic Aorta: 37 mm   Aortic Arch: 27 mm   Descending Thoracic Aorta: 27 mm   Sinus of Valsalva Measurements:   Non-coronary: 33.4 mm  Height 20.5 mm   Right - coronary: 31 mm  Height 22.9 mm   Left -   coronary: 32 mm Height 22.4 mm   Coronary Artery Height above Annulus:   Left Main: 17.3 mm above annulus   Right Coronary: 16.9 mm above annulus   Virtual Basal Annulus Measurements:   Maximum / Minimum Diameter: 26.2 mm x 23.3 mm   Perimeter: 79.3 mm   Area: 490 mm2   Coronary Arteries: Sufficient height above annulus for deployment   Optimum Fluoroscopic Angle for Delivery: LAO 9 Caudal 10 degrees   IMPRESSION: 1. Calcified tri leaflet AV with score 2371   2.  Coronary arteries sufficient height above annulus for deployment   3. Optimum angiographic angle for deployment LAO 9 Caudal 10 degrees   4. Annular area 490 mm2 suitable for a 26 mm Sapien valve or 29 mm Medtronic Evolut valve   5.  Membranous septal thickness 8.8 mm      Recent Lab Findings: Lab Results  Component Value Date   WBC 6.5 09/19/2023   HGB 15.3 09/28/2023   HGB 15.0 09/28/2023   HCT 45.0 09/28/2023   HCT 44.0 09/28/2023   PLT 212 09/19/2023   GLUCOSE 103 (H) 10/05/2023   CHOL 98 (L) 09/22/2023   TRIG 172 (H) 09/22/2023   HDL 39 (L) 09/22/2023   LDLCALC 31 09/22/2023   ALT 13 09/22/2023   AST 28 09/22/2023   NA 140 10/05/2023   K 5.0 10/05/2023   CL 104 10/05/2023    CREATININE 1.89 (H) 10/05/2023   BUN 24 10/05/2023   CO2 19 (L) 10/05/2023   TSH 0.531 02/27/2023   INR 1.1 09/29/2023   HGBA1C 5.5 02/26/2023      Assessment / Plan:   78 yo male with NYHA class 2 symptoms of severe AS with preserved LV function. He has significant comorbidities of age, PVD, emphysema, PHTN, CAD, hypercoagulable state and is not a surgical candidate. His best option would be alternative access TAVR perhaps via the L subclavian approach. He understands that he would need to be off coumadin  for 5 days prior to surgery and perhaps bridging with lovenox  (he did not have that with cath but pt very concerned about having repeat PE) and also understands all the risks, goals, and recovery from this procedure.     I have spent 60 min in review of the records, viewing studies and in face to face with patient and in coordination of future care    Deward Kallman 10/26/2023 6:13 PM

## 2023-10-27 ENCOUNTER — Institutional Professional Consult (permissible substitution): Payer: Medicare Other | Admitting: Thoracic Surgery (Cardiothoracic Vascular Surgery)

## 2023-10-27 ENCOUNTER — Encounter: Payer: Self-pay | Admitting: Thoracic Surgery (Cardiothoracic Vascular Surgery)

## 2023-10-27 VITALS — BP 190/78 | HR 85 | Resp 20 | Ht 73.0 in | Wt 230.0 lb

## 2023-10-27 DIAGNOSIS — I350 Nonrheumatic aortic (valve) stenosis: Secondary | ICD-10-CM | POA: Diagnosis not present

## 2023-10-27 NOTE — Patient Instructions (Signed)
 Alternative access TAVR Stop coumadin 5 days prior

## 2023-11-02 ENCOUNTER — Other Ambulatory Visit: Payer: Self-pay

## 2023-11-02 DIAGNOSIS — I350 Nonrheumatic aortic (valve) stenosis: Secondary | ICD-10-CM

## 2023-11-03 ENCOUNTER — Encounter: Payer: Medicare Other | Admitting: Thoracic Surgery (Cardiothoracic Vascular Surgery)

## 2023-11-04 ENCOUNTER — Encounter (HOSPITAL_COMMUNITY)
Admission: RE | Admit: 2023-11-04 | Discharge: 2023-11-04 | Disposition: A | Discharge status: Home / Self Care | Payer: Medicare Other | Source: Ambulatory Visit | Attending: Cardiovascular Disease | Admitting: Cardiovascular Disease

## 2023-11-04 ENCOUNTER — Ambulatory Visit (HOSPITAL_COMMUNITY)
Admission: RE | Admit: 2023-11-04 | Discharge: 2023-11-04 | Disposition: A | Discharge status: Home / Self Care | Payer: Medicare Other | Source: Ambulatory Visit | Attending: Cardiovascular Disease | Admitting: Cardiovascular Disease

## 2023-11-04 ENCOUNTER — Other Ambulatory Visit: Payer: Self-pay

## 2023-11-04 DIAGNOSIS — Z01810 Encounter for preprocedural cardiovascular examination: Secondary | ICD-10-CM | POA: Diagnosis present

## 2023-11-04 DIAGNOSIS — I4519 Other right bundle-branch block: Secondary | ICD-10-CM | POA: Diagnosis not present

## 2023-11-04 DIAGNOSIS — Z01818 Encounter for other preprocedural examination: Secondary | ICD-10-CM | POA: Insufficient documentation

## 2023-11-04 DIAGNOSIS — I350 Nonrheumatic aortic (valve) stenosis: Secondary | ICD-10-CM

## 2023-11-04 DIAGNOSIS — R9431 Abnormal electrocardiogram [ECG] [EKG]: Secondary | ICD-10-CM | POA: Diagnosis not present

## 2023-11-04 DIAGNOSIS — Z01812 Encounter for preprocedural laboratory examination: Secondary | ICD-10-CM | POA: Diagnosis present

## 2023-11-04 LAB — CBC
HCT: 54.7 % — ABNORMAL HIGH (ref 39.0–52.0)
Hemoglobin: 17.6 g/dL — ABNORMAL HIGH (ref 13.0–17.0)
MCH: 31.3 pg (ref 26.0–34.0)
MCHC: 32.2 g/dL (ref 30.0–36.0)
MCV: 97.3 fL (ref 80.0–100.0)
Platelets: 256 10*3/uL (ref 150–400)
RBC: 5.62 MIL/uL (ref 4.22–5.81)
RDW: 16.5 % — ABNORMAL HIGH (ref 11.5–15.5)
WBC: 8.8 10*3/uL (ref 4.0–10.5)
nRBC: 0 % (ref 0.0–0.2)

## 2023-11-04 LAB — TYPE AND SCREEN
ABO/RH(D): B POS
Antibody Screen: NEGATIVE

## 2023-11-04 LAB — COMPREHENSIVE METABOLIC PANEL
ALT: 17 U/L (ref 0–44)
AST: 31 U/L (ref 15–41)
Albumin: 4.1 g/dL (ref 3.5–5.0)
Alkaline Phosphatase: 43 U/L (ref 38–126)
Anion gap: 10 (ref 5–15)
BUN: 27 mg/dL — ABNORMAL HIGH (ref 8–23)
CO2: 25 mmol/L (ref 22–32)
Calcium: 9.5 mg/dL (ref 8.9–10.3)
Chloride: 103 mmol/L (ref 98–111)
Creatinine, Ser: 2.02 mg/dL — ABNORMAL HIGH (ref 0.61–1.24)
GFR, Estimated: 33 mL/min — ABNORMAL LOW (ref >60)
Glucose, Bld: 105 mg/dL — ABNORMAL HIGH (ref 70–99)
Potassium: 4.9 mmol/L (ref 3.5–5.1)
Sodium: 138 mmol/L (ref 135–145)
Total Bilirubin: 1.1 mg/dL (ref 0.0–1.2)
Total Protein: 7.6 g/dL (ref 6.5–8.1)

## 2023-11-04 LAB — URINALYSIS, ROUTINE W REFLEX MICROSCOPIC
Bilirubin Urine: NEGATIVE
Glucose, UA: 150 mg/dL — AB
Hgb urine dipstick: NEGATIVE
Ketones, ur: NEGATIVE mg/dL
Leukocytes,Ua: NEGATIVE
Nitrite: NEGATIVE
Protein, ur: NEGATIVE mg/dL
Specific Gravity, Urine: 1.008 (ref 1.005–1.030)
pH: 5 (ref 5.0–8.0)

## 2023-11-04 LAB — PROTIME-INR
INR: 2.1 — ABNORMAL HIGH (ref 0.8–1.2)
Prothrombin Time: 23.6 s — ABNORMAL HIGH (ref 11.4–15.2)

## 2023-11-04 LAB — SURGICAL PCR SCREEN
MRSA, PCR: NEGATIVE
Staphylococcus aureus: POSITIVE — AB

## 2023-11-04 NOTE — Progress Notes (Signed)
Patient signed all consents at PAT lab appointment. CHG soap and instructions were given to patient. CHG surgical prep reviewed with patient and all questions answered.  Patients chart send to anesthesia for review. Pt denies any respiratory illness/infection in the last two months.   Pt states he took his coumadin yesterday, January 16th, but was instructed to take his last dose on January 15th. Cline Crock, RN with TAVR team made aware. Per Samara Deist, pt instructed to take NO MORE coumadin and they will follow-up with results of PT-INR drawn today. Pt understood instructions.

## 2023-11-07 MED ORDER — HEPARIN 30,000 UNITS/1000 ML (OHS) CELLSAVER SOLUTION
Status: DC
  Filled 2023-11-07 (×2): qty 1000

## 2023-11-07 MED ORDER — POTASSIUM CHLORIDE 2 MEQ/ML IV SOLN
80.0000 meq | INTRAVENOUS | Status: DC
  Filled 2023-11-07 (×2): qty 40

## 2023-11-07 MED ORDER — MAGNESIUM SULFATE 50 % IJ SOLN
40.0000 meq | INTRAMUSCULAR | Status: DC
  Filled 2023-11-07 (×2): qty 9.85

## 2023-11-07 MED ORDER — DEXMEDETOMIDINE HCL IN NACL 400 MCG/100ML IV SOLN
0.1000 ug/kg/h | INTRAVENOUS | Status: DC
  Filled 2023-11-07: qty 100

## 2023-11-07 MED ORDER — CEFAZOLIN SODIUM-DEXTROSE 2-4 GM/100ML-% IV SOLN
2.0000 g | INTRAVENOUS | Status: DC
  Filled 2023-11-07: qty 100

## 2023-11-07 MED ORDER — NOREPINEPHRINE 4 MG/250ML-% IV SOLN
0.0000 ug/min | INTRAVENOUS | Status: AC
  Administered 2023-11-08: 2 ug/min via INTRAVENOUS
  Administered 2023-11-08: 4 ug/min via INTRAVENOUS
  Filled 2023-11-07: qty 250

## 2023-11-07 NOTE — H&P (Signed)
[address]       [place] [address]             [phone]      Cardiothoracic Surgery Admission History and Physical   Tony Zhang [facility] Medical Record #[id number] Date of Birth: 06-28-1946   Thurmon Fair, MD System, Provider Not In   Chief Complaint:   Increasing SOB   History of Present Illness:      Pt is a very pleasant 78 yo male who has been having increasing SOB with activity especially when he works at the [employer]. He was evaluated by Dr Excell Seltzer in November and TTE revealed EF of 60% and a mean gradient of with an AVA of 0.72cms. He was felt to best be served with AVR and with his comorbidities of severe PVD and occluded distal aorta, he was felt to require alternative access TAVR. Recent carotid duplex has RICA 100% occluded and LICA at 50% and with flow disturbance in LSCA. He underwent cath with moderate CAD felt best to be managed medically. He had evidence of PHTN. Pt has a history of previous stenting of CAD and also a 40 yr history of hypercoagulable state with PE and repeat 20 years ago when he came off coumadin. He reports he has not been on plavix and was placed on asa then removed from asa by Dr Royann Shivers. He denies syncope or CP.             Past Medical History:  Diagnosis Date   AAA (abdominal aortic aneurysm) (HCC)     Blood dyscrasia      followed by Dr. Truett Perna, for clotting issue, has been on Coumadin for about 20 yrs.     CAD (coronary artery disease)     COPD (chronic obstructive pulmonary disease) (HCC)     DJD (degenerative joint disease)     Dyslipidemia     Gout     Malignant hypertension     Peripheral vascular disease (HCC)     Pneumonia 08/18/2013    pt. reports that he is having this surgery 11/25/2014- for the lung problem that began with pneumonia in 09/2014   Severe aortic stenosis     Venous thrombosis      Recurrent               Past Surgical History:  Procedure Laterality Date    APPENDECTOMY       BACK SURGERY       COLONOSCOPY N/A 04/13/2015    Procedure: COLONOSCOPY;  Surgeon: Carman Ching, MD;  Location: [facility] ENDOSCOPY;  Service: Endoscopy;  Laterality: N/A;   CORONARY STENT INTERVENTION N/A 06/04/2022    Procedure: CORONARY STENT INTERVENTION;  Surgeon: Corky Crafts, MD;  Location: [facility] INVASIVE CV LAB;  Service: Cardiovascular;  Laterality: N/A;   EMPYEMA DRAINAGE N/A 11/25/2014    Procedure: EMPYEMA DRAINAGE;  Surgeon: Delight Ovens, MD;  Location: [facility] OR;  Service: Thoracic;  Laterality: N/A;   ESOPHAGOGASTRODUODENOSCOPY N/A 04/10/2015    Procedure: ESOPHAGOGASTRODUODENOSCOPY (EGD);  Surgeon: Charlott Rakes, MD;  Location: [facility] ENDOSCOPY;  Service: Endoscopy;  Laterality: N/A;   EYE SURGERY        both eyes, cataracts removed, denies lens implants   FLEXIBLE SIGMOIDOSCOPY N/A 04/12/2015    Procedure: FLEXIBLE SIGMOIDOSCOPY;  Surgeon: Carman Ching, MD;  Location: [facility] ENDOSCOPY;  Service: Endoscopy;  Laterality: N/A;   HERNIA REPAIR       LEFT HEART  CATH AND CORONARY ANGIOGRAPHY N/A 06/04/2022    Procedure: LEFT HEART CATH AND CORONARY ANGIOGRAPHY;  Surgeon: Corky Crafts, MD;  Location: [facility] INVASIVE CV LAB;  Service: Cardiovascular;  Laterality: N/A;   RIGHT HEART CATH AND CORONARY ANGIOGRAPHY N/A 09/28/2023    Procedure: RIGHT HEART CATH AND CORONARY ANGIOGRAPHY;  Surgeon: Tonny Bollman, MD;  Location: [facility] INVASIVE CV LAB;  Service: Cardiovascular;  Laterality: N/A;   SHOULDER SURGERY Right     VIDEO ASSISTED THORACOSCOPY Right 11/25/2014    Procedure: VIDEO ASSISTED THORACOSCOPY;  Surgeon: Delight Ovens, MD;  Location: [facility] OR;  Service: Thoracic;  Laterality: Right;   VIDEO BRONCHOSCOPY N/A 11/25/2014    Procedure: VIDEO BRONCHOSCOPY;  Surgeon: Delight Ovens, MD;  Location: [facility] OR;  Service: Thoracic;  Laterality: N/A;          Tobacco Use History  Social History        Tobacco Use  Smoking Status Former   Current packs/day: 0.00   Types:  Cigarettes   Quit date: 10/18/1992   Years since quitting: 31.0  Smokeless Tobacco Never      Social History       Substance and Sexual Activity  Alcohol Use No      Social History         Socioeconomic History   Marital status: Widowed      Spouse name: Not on file   Number of children: 0   Years of education: Not on file   Highest education level: 6th grade  Occupational History      Comment: semi retired  Tobacco Use   Smoking status: Former      Current packs/day: 0.00      Types: Cigarettes      Quit date: 10/18/1992      Years since quitting: 31.0   Smokeless tobacco: Never  Vaping Use   Vaping status: Never Used  Substance and Sexual Activity   Alcohol use: No   Drug use: No   Sexual activity: Not on file  Other Topics Concern   Not on file  Social History Narrative   Not on file    Social Drivers of Health        Financial Resource Strain: Low Risk  (06/04/2022)    Overall Financial Resource Strain (CARDIA)     Difficulty of Paying Living Expenses: Not hard at all  Food Insecurity: No Food Insecurity (09/28/2023)    Hunger Vital Sign     Worried About Running Out of Food in the Last Year: Never true     Ran Out of Food in the Last Year: Never true  Transportation Needs: No Transportation Needs (09/28/2023)    PRAPARE - Therapist, art (Medical): No     Lack of Transportation (Non-Medical): No  Physical Activity: Not on file  Stress: Not on file  Social Connections: Not on file  Intimate Partner Violence: Not At Risk (09/28/2023)    Humiliation, Afraid, Rape, and Kick questionnaire     Fear of Current or Ex-Partner: No     Emotionally Abused: No     Physically Abused: No     Sexually Abused: No      Allergies       Allergies  Allergen Reactions   Codeine Hives and Other (See Comments)      Takes benadryl to stop allergic reactions              Current Outpatient Medications  Medication Sig Dispense Refill    albuterol (VENTOLIN HFA) 108 (90 Base) MCG/ACT inhaler Inhale 2 puffs into the lungs in the morning and at bedtime.       allopurinol (ZYLOPRIM) 300 MG tablet Take 300 mg by mouth at bedtime.       atorvastatin (LIPITOR) 20 MG tablet Take 1 tablet (20 mg total) by mouth daily. 90 tablet 3   budesonide-formoterol (SYMBICORT) 160-4.5 MCG/ACT inhaler Inhale 2 puffs into the lungs in the morning and at bedtime. 1 each 12   Choline Fenofibrate (FENOFIBRIC ACID) 135 MG CPDR TAKE 1 TABLET BY MOUTH DAILY 90 capsule 3   cilostazol (PLETAL) 100 MG tablet Take 100 mg by mouth daily.       cyanocobalamin 1000 MCG tablet Take 1 tablet (1,000 mcg total) by mouth daily. 90 tablet 0   empagliflozin (JARDIANCE) 10 MG TABS tablet Take 1 tablet (10 mg total) by mouth daily. 90 tablet 3   Evolocumab (REPATHA SURECLICK) 140 MG/ML SOAJ Inject 140 mg into the skin every 14 (fourteen) days. 2 mL 12   furosemide (LASIX) 20 MG tablet Take 1 tablet (20 mg total) by mouth daily. 90 tablet 3   gabapentin (NEURONTIN) 300 MG capsule Take 1 capsule (300 mg total) by mouth 2 (two) times daily. 60 capsule 2   metoprolol succinate (TOPROL-XL) 100 MG 24 hr tablet Take 1 tablet (100 mg total) by mouth daily. 90 tablet 3   nitroGLYCERIN (NITROSTAT) 0.3 MG SL tablet Place under tongue. May take every 5 minutes up to 3 doses for chest pain. If still having pain after 3 doses, call 911. (Patient not taking: Reported on 10/05/2023) 30 tablet 3   ondansetron (ZOFRAN) 8 MG tablet Take 8 mg by mouth 2 (two) times daily as needed for vomiting or nausea. (Patient not taking: Reported on 10/05/2023)       pyridOXINE (B-6) 50 MG tablet Take 1 tablet (50 mg total) by mouth daily. 90 tablet 0   sodium zirconium cyclosilicate (LOKELMA) 10 g PACK packet Take 10 g by mouth 3 (three) times daily. Take 1 packet three times a Day for 48 Hours 6 packet 0   tamsulosin (FLOMAX) 0.4 MG CAPS capsule Take 0.4 mg by mouth every evening.       warfarin (COUMADIN)  2 MG tablet Take 2-4 mg by mouth See admin instructions. Take 2 tablets (4mg ) by mouth Saturday and Sunday, then take 1 tablet (2mg ) all other days          No current facility-administered medications for this visit.               Family History  Problem Relation Age of Onset   CAD Father 41   Liver cancer Brother     CAD Sister                  Physical Exam: Elderly appearing Lungs: distant bs Card: RR with soft systolic murmur Ext: warm Neuro: intact         Diagnostic Studies & Laboratory data: I have personally reviewed the following studies and agree with the findings   TTE (07/2023) IMPRESSIONS     1. The aortic valve is thickened and moderately calcified. Aortic valve  regurgitation is mild. Paradoxical Severe Low flow low gradient aortic  stenosis. Aortic regurgitation PHT measures 549 msec. Aortic valve area,  by VTI measures 0.67 cm. Aortic  valve mean gradient measures 31.8 mmHg. Aortic valve Vmax 3.53 m/s, SVI  28,  DI 0.18, Indexed AVA 0.29.   2. Left ventricular ejection fraction, by estimation, is 60 to 65%. The  left ventricle has normal function. The left ventricle has no regional  wall motion abnormalities. There is mild concentric left ventricular  hypertrophy. Left ventricular diastolic  parameters are indeterminate.   3. Right ventricular systolic function is normal. The right ventricular  size is normal.   4. The mitral valve is degenerative. No evidence of mitral valve  regurgitation. No evidence of mitral stenosis.   5. The inferior vena cava is normal in size with greater than 50%  respiratory variability, suggesting right atrial pressure of 3 mmHg.   6. There is mild dilatation of the ascending aorta, measuring 40 mm.   FINDINGS   Left Ventricle: Left ventricular ejection fraction, by estimation, is 60  to 65%. The left ventricle has normal function. The left ventricle has no  regional wall motion abnormalities. The left  ventricular internal cavity  size was normal in size. There is   mild concentric left ventricular hypertrophy. Left ventricular diastolic  parameters are indeterminate.   Right Ventricle: The right ventricular size is normal. No increase in  right ventricular wall thickness. Right ventricular systolic function is  normal.   Left Atrium: Left atrial size was normal in size.   Right Atrium: Right atrial size was normal in size.   Pericardium: There is no evidence of pericardial effusion. Presence of  epicardial fat layer.   Mitral Valve: The mitral valve is degenerative in appearance. Mild to  moderate mitral annular calcification. No evidence of mitral valve  regurgitation. No evidence of mitral valve stenosis.   Tricuspid Valve: The tricuspid valve is normal in structure. Tricuspid  valve regurgitation is not demonstrated. No evidence of tricuspid  stenosis.   Aortic Valve: The aortic valve is calcified. There is moderate  calcification of the aortic valve. There is moderate thickening of the  aortic valve. Aortic valve regurgitation is mild. Aortic regurgitation PHT  measures 549 msec. Parodoxical Severe Low  flow low gradient aortic stenosis. Aortic valve mean gradient measures  31.8 mmHg. Aortic valve peak gradient measures 49.9 mmHg. Aortic valve  area, by VTI measures 0.67 cm.   Pulmonic Valve: The pulmonic valve was normal in structure. Pulmonic valve  regurgitation is not visualized. No evidence of pulmonic stenosis.   Aorta: There is mild dilatation of the ascending aorta, measuring 40 mm.   Venous: The inferior vena cava is normal in size with greater than 50%  respiratory variability, suggesting right atrial pressure of 3 mmHg.   IAS/Shunts: No atrial level shunt detected by color flow Doppler.     LEFT VENTRICLE  PLAX 2D  LVIDd:         5.05 cm     Diastology  LVIDs:         3.00 cm     LV e' medial:    5.35 cm/s  LV PW:         1.05 cm     LV E/e' medial:   23.4  LV IVS:        1.05 cm     LV e' lateral:   6.49 cm/s  LVOT diam:     2.20 cm     LV E/e' lateral: 19.3  LV SV:         63  LV SV Index:   28  LVOT Area:     3.80 cm    LV Volumes (MOD)  LV  vol d, MOD A2C: 87.5 ml  LV vol d, MOD A4C: 96.5 ml  LV vol s, MOD A2C: 32.5 ml  LV vol s, MOD A4C: 38.5 ml  LV SV MOD A2C:     55.0 ml  LV SV MOD A4C:     96.5 ml  LV SV MOD BP:      55.8 ml   RIGHT VENTRICLE  RV S prime:     10.60 cm/s  TAPSE (M-mode): 1.9 cm   LEFT ATRIUM             Index        RIGHT ATRIUM           Index  LA diam:        5.10 cm 2.26 cm/m   RA Area:     14.80 cm  LA Vol (A2C):   51.2 ml 22.68 ml/m  RA Volume:   34.80 ml  15.41 ml/m  LA Vol (A4C):   58.1 ml 25.73 ml/m  LA Biplane Vol: 56.7 ml 25.11 ml/m   AORTIC VALVE  AV Area (Vmax):    0.84 cm  AV Area (Vmean):   0.72 cm  AV Area (VTI):     0.67 cm  AV Vmax:           353.25 cm/s  AV Vmean:          268.750 cm/s  AV VTI:            0.954 m  AV Peak Grad:      49.9 mmHg  AV Mean Grad:      31.8 mmHg  LVOT Vmax:         78.10 cm/s  LVOT Vmean:        50.700 cm/s  LVOT VTI:          0.167 m  LVOT/AV VTI ratio: 0.18  AI PHT:            549 msec    AORTA  Ao Root diam: 3.20 cm  Ao Asc diam:  3.95 cm   MITRAL VALVE  MV Area (PHT): 3.42 cm     SHUNTS  MV Decel Time: 222 msec     Systemic VTI:  0.17 m  MV E velocity: 125.00 cm/s  Systemic Diam: 2.20 cm  MV A velocity: 118.00 cm/s  MV E/A ratio:  1.06    CATH (09/2023) Conclusion       Ost LAD to Mid LAD lesion is 50% stenosed.   Ost RCA to Prox RCA lesion is 80% stenosed.   Prox RCA lesion is 25% stenosed.   Mid RCA lesion is 70% stenosed.   1st Diag lesion is 50% stenosed.   3rd Mrg lesion is 75% stenosed.   Dist RCA lesion is 50% stenosed.   Non-stenotic Mid LAD lesion was previously treated.   Hemodynamic findings consistent with moderate pulmonary hypertension.   1.  Severe calcific stenosis of the RCA, unchanged from the  previous cardiac catheterization procedure 2.  Continued patency of the stented segment in the mid LAD with moderate nonobstructive plaquing in the proximal vessel 3.  Patent left circumflex with no high-grade stenosis 4.  Patent left subclavian artery with mild calcified stenosis 5.  Calcified aortic valve leaflets with known severe aortic stenosis by noninvasive assessment 6.  Moderate pulmonary hypertension with mean PA pressure 39 mmHg, transpulmonary gradient 20 mmHg, PVR 4.26 Wood units    Recent Radiology Findings:   CTA (09/2023)  FINDINGS: Aortic Valve: Tri leaflet calcified with restricted leaflet motion Calcium score 2371   Aorta: Normal arch vessels no aneurysm Severe calcific atherosclerosis with soft plaque in the arch   Sino-tubular Junction: 28 mm   Ascending Thoracic Aorta: 37 mm   Aortic Arch: 27 mm   Descending Thoracic Aorta: 27 mm   Sinus of Valsalva Measurements:   Non-coronary: 33.4 mm  Height 20.5 mm   Right - coronary: 31 mm  Height 22.9 mm   Left -   coronary: 32 mm Height 22.4 mm   Coronary Artery Height above Annulus:   Left Main: 17.3 mm above annulus   Right Coronary: 16.9 mm above annulus   Virtual Basal Annulus Measurements:   Maximum / Minimum Diameter: 26.2 mm x 23.3 mm   Perimeter: 79.3 mm   Area: 490 mm2   Coronary Arteries: Sufficient height above annulus for deployment   Optimum Fluoroscopic Angle for Delivery: LAO 9 Caudal 10 degrees   IMPRESSION: 1. Calcified tri leaflet AV with score 2371   2.  Coronary arteries sufficient height above annulus for deployment   3. Optimum angiographic angle for deployment LAO 9 Caudal 10 degrees   4. Annular area 490 mm2 suitable for a 26 mm Sapien valve or 29 mm Medtronic Evolut valve   5.  Membranous septal thickness 8.8 mm       Recent Lab Findings: Recent Labs       Lab Results  Component Value Date    WBC 6.5 09/19/2023    HGB 15.3 09/28/2023    HGB 15.0 09/28/2023     HCT 45.0 09/28/2023    HCT 44.0 09/28/2023    PLT 212 09/19/2023    GLUCOSE 103 (H) 10/05/2023    CHOL 98 (L) 09/22/2023    TRIG 172 (H) 09/22/2023    HDL 39 (L) 09/22/2023    LDLCALC 31 09/22/2023    ALT 13 09/22/2023    AST 28 09/22/2023    NA 140 10/05/2023    K 5.0 10/05/2023    CL 104 10/05/2023    CREATININE 1.89 (H) 10/05/2023    BUN 24 10/05/2023    CO2 19 (L) 10/05/2023    TSH 0.531 02/27/2023    INR 1.1 09/29/2023    HGBA1C 5.5 02/26/2023            Assessment / Plan:     78 yo male with NYHA class 2 symptoms of severe AS with preserved LV function. He has significant comorbidities of age, PVD, emphysema, PHTN, CAD, hypercoagulable state and is not a surgical candidate. His best option would be alternative access TAVR perhaps via the L subclavian approach. He understands that he would need to be off coumadin for 5 days prior to surgery and also understands all the risks, goals, and recovery from this procedure.

## 2023-11-08 ENCOUNTER — Inpatient Hospital Stay (HOSPITAL_COMMUNITY): Payer: Medicare Other

## 2023-11-08 ENCOUNTER — Inpatient Hospital Stay (HOSPITAL_COMMUNITY): Payer: Medicare Other | Admitting: Physician Assistant

## 2023-11-08 ENCOUNTER — Encounter (HOSPITAL_COMMUNITY): Payer: Self-pay | Admitting: Cardiovascular Disease

## 2023-11-08 ENCOUNTER — Other Ambulatory Visit: Payer: Self-pay | Admitting: Physician Assistant

## 2023-11-08 ENCOUNTER — Inpatient Hospital Stay (HOSPITAL_COMMUNITY): Payer: Medicare Other | Admitting: Certified Registered Nurse Anesthetist

## 2023-11-08 ENCOUNTER — Encounter (HOSPITAL_COMMUNITY)
Admission: RE | Disposition: A | Discharge status: Home / Self Care | Payer: Medicare Other | Source: Home / Self Care | Attending: Cardiovascular Disease

## 2023-11-08 ENCOUNTER — Inpatient Hospital Stay (HOSPITAL_COMMUNITY)
Admission: RE | Admit: 2023-11-08 | Discharge: 2023-11-24 | DRG: 266 | Disposition: A | Discharge status: Home / Self Care | Payer: Medicare Other | Attending: Cardiovascular Disease | Admitting: Cardiovascular Disease

## 2023-11-08 DIAGNOSIS — I97638 Postprocedural hematoma of a circulatory system organ or structure following other circulatory system procedure: Secondary | ICD-10-CM | POA: Diagnosis not present

## 2023-11-08 DIAGNOSIS — E872 Acidosis, unspecified: Secondary | ICD-10-CM | POA: Diagnosis present

## 2023-11-08 DIAGNOSIS — J869 Pyothorax without fistula: Secondary | ICD-10-CM

## 2023-11-08 DIAGNOSIS — I700 Atherosclerosis of aorta: Secondary | ICD-10-CM | POA: Diagnosis present

## 2023-11-08 DIAGNOSIS — I7409 Other arterial embolism and thrombosis of abdominal aorta: Secondary | ICD-10-CM

## 2023-11-08 DIAGNOSIS — R34 Anuria and oliguria: Secondary | ICD-10-CM | POA: Diagnosis not present

## 2023-11-08 DIAGNOSIS — G4733 Obstructive sleep apnea (adult) (pediatric): Secondary | ICD-10-CM

## 2023-11-08 DIAGNOSIS — Z006 Encounter for examination for normal comparison and control in clinical research program: Secondary | ICD-10-CM

## 2023-11-08 DIAGNOSIS — I350 Nonrheumatic aortic (valve) stenosis: Secondary | ICD-10-CM | POA: Diagnosis present

## 2023-11-08 DIAGNOSIS — L7632 Postprocedural hematoma of skin and subcutaneous tissue following other procedure: Secondary | ICD-10-CM | POA: Diagnosis not present

## 2023-11-08 DIAGNOSIS — I714 Abdominal aortic aneurysm, without rupture, unspecified: Secondary | ICD-10-CM | POA: Diagnosis present

## 2023-11-08 DIAGNOSIS — Z7984 Long term (current) use of oral hypoglycemic drugs: Secondary | ICD-10-CM

## 2023-11-08 DIAGNOSIS — Z79899 Other long term (current) drug therapy: Secondary | ICD-10-CM

## 2023-11-08 DIAGNOSIS — N189 Chronic kidney disease, unspecified: Secondary | ICD-10-CM | POA: Diagnosis present

## 2023-11-08 DIAGNOSIS — Z87891 Personal history of nicotine dependence: Secondary | ICD-10-CM

## 2023-11-08 DIAGNOSIS — S20219A Contusion of unspecified front wall of thorax, initial encounter: Secondary | ICD-10-CM | POA: Diagnosis not present

## 2023-11-08 DIAGNOSIS — I252 Old myocardial infarction: Secondary | ICD-10-CM

## 2023-11-08 DIAGNOSIS — K5900 Constipation, unspecified: Secondary | ICD-10-CM | POA: Diagnosis present

## 2023-11-08 DIAGNOSIS — K551 Chronic vascular disorders of intestine: Secondary | ICD-10-CM | POA: Diagnosis present

## 2023-11-08 DIAGNOSIS — I701 Atherosclerosis of renal artery: Secondary | ICD-10-CM

## 2023-11-08 DIAGNOSIS — E785 Hyperlipidemia, unspecified: Secondary | ICD-10-CM | POA: Diagnosis present

## 2023-11-08 DIAGNOSIS — Z952 Presence of prosthetic heart valve: Principal | ICD-10-CM

## 2023-11-08 DIAGNOSIS — E860 Dehydration: Secondary | ICD-10-CM | POA: Diagnosis not present

## 2023-11-08 DIAGNOSIS — J9621 Acute and chronic respiratory failure with hypoxia: Secondary | ICD-10-CM | POA: Diagnosis not present

## 2023-11-08 DIAGNOSIS — I959 Hypotension, unspecified: Secondary | ICD-10-CM | POA: Diagnosis not present

## 2023-11-08 DIAGNOSIS — I5033 Acute on chronic diastolic (congestive) heart failure: Secondary | ICD-10-CM | POA: Diagnosis present

## 2023-11-08 DIAGNOSIS — I5032 Chronic diastolic (congestive) heart failure: Secondary | ICD-10-CM

## 2023-11-08 DIAGNOSIS — I214 Non-ST elevation (NSTEMI) myocardial infarction: Secondary | ICD-10-CM

## 2023-11-08 DIAGNOSIS — D631 Anemia in chronic kidney disease: Secondary | ICD-10-CM | POA: Diagnosis present

## 2023-11-08 DIAGNOSIS — J9811 Atelectasis: Secondary | ICD-10-CM | POA: Diagnosis present

## 2023-11-08 DIAGNOSIS — I7122 Aneurysm of the aortic arch, without rupture: Secondary | ICD-10-CM | POA: Diagnosis present

## 2023-11-08 DIAGNOSIS — J439 Emphysema, unspecified: Secondary | ICD-10-CM | POA: Diagnosis present

## 2023-11-08 DIAGNOSIS — K859 Acute pancreatitis without necrosis or infection, unspecified: Secondary | ICD-10-CM | POA: Diagnosis not present

## 2023-11-08 DIAGNOSIS — E871 Hypo-osmolality and hyponatremia: Secondary | ICD-10-CM | POA: Diagnosis present

## 2023-11-08 DIAGNOSIS — I82409 Acute embolism and thrombosis of unspecified deep veins of unspecified lower extremity: Secondary | ICD-10-CM | POA: Diagnosis present

## 2023-11-08 DIAGNOSIS — D6859 Other primary thrombophilia: Secondary | ICD-10-CM | POA: Diagnosis present

## 2023-11-08 DIAGNOSIS — I5031 Acute diastolic (congestive) heart failure: Secondary | ICD-10-CM | POA: Diagnosis not present

## 2023-11-08 DIAGNOSIS — J9601 Acute respiratory failure with hypoxia: Secondary | ICD-10-CM | POA: Diagnosis not present

## 2023-11-08 DIAGNOSIS — I2510 Atherosclerotic heart disease of native coronary artery without angina pectoris: Secondary | ICD-10-CM

## 2023-11-08 DIAGNOSIS — T148XXA Other injury of unspecified body region, initial encounter: Principal | ICD-10-CM

## 2023-11-08 DIAGNOSIS — J441 Chronic obstructive pulmonary disease with (acute) exacerbation: Secondary | ICD-10-CM | POA: Diagnosis not present

## 2023-11-08 DIAGNOSIS — K851 Biliary acute pancreatitis without necrosis or infection: Secondary | ICD-10-CM | POA: Diagnosis not present

## 2023-11-08 DIAGNOSIS — I739 Peripheral vascular disease, unspecified: Secondary | ICD-10-CM | POA: Diagnosis not present

## 2023-11-08 DIAGNOSIS — K922 Gastrointestinal hemorrhage, unspecified: Secondary | ICD-10-CM | POA: Diagnosis present

## 2023-11-08 DIAGNOSIS — I10 Essential (primary) hypertension: Secondary | ICD-10-CM

## 2023-11-08 DIAGNOSIS — Y838 Other surgical procedures as the cause of abnormal reaction of the patient, or of later complication, without mention of misadventure at the time of the procedure: Secondary | ICD-10-CM | POA: Diagnosis not present

## 2023-11-08 DIAGNOSIS — Z7902 Long term (current) use of antithrombotics/antiplatelets: Secondary | ICD-10-CM

## 2023-11-08 DIAGNOSIS — I471 Supraventricular tachycardia, unspecified: Secondary | ICD-10-CM | POA: Diagnosis present

## 2023-11-08 DIAGNOSIS — M109 Gout, unspecified: Secondary | ICD-10-CM | POA: Diagnosis present

## 2023-11-08 DIAGNOSIS — I130 Hypertensive heart and chronic kidney disease with heart failure and stage 1 through stage 4 chronic kidney disease, or unspecified chronic kidney disease: Secondary | ICD-10-CM | POA: Diagnosis present

## 2023-11-08 DIAGNOSIS — E861 Hypovolemia: Secondary | ICD-10-CM | POA: Diagnosis not present

## 2023-11-08 DIAGNOSIS — Z800 Family history of malignant neoplasm of digestive organs: Secondary | ICD-10-CM

## 2023-11-08 DIAGNOSIS — Z885 Allergy status to narcotic agent status: Secondary | ICD-10-CM

## 2023-11-08 DIAGNOSIS — N400 Enlarged prostate without lower urinary tract symptoms: Secondary | ICD-10-CM

## 2023-11-08 DIAGNOSIS — Z86711 Personal history of pulmonary embolism: Secondary | ICD-10-CM

## 2023-11-08 DIAGNOSIS — I7141 Pararenal abdominal aortic aneurysm, without rupture: Secondary | ICD-10-CM | POA: Diagnosis not present

## 2023-11-08 DIAGNOSIS — I82402 Acute embolism and thrombosis of unspecified deep veins of left lower extremity: Secondary | ICD-10-CM

## 2023-11-08 DIAGNOSIS — N1832 Chronic kidney disease, stage 3b: Secondary | ICD-10-CM | POA: Diagnosis present

## 2023-11-08 DIAGNOSIS — J90 Pleural effusion, not elsewhere classified: Secondary | ICD-10-CM

## 2023-11-08 DIAGNOSIS — Z8679 Personal history of other diseases of the circulatory system: Secondary | ICD-10-CM

## 2023-11-08 DIAGNOSIS — R339 Retention of urine, unspecified: Secondary | ICD-10-CM | POA: Diagnosis not present

## 2023-11-08 DIAGNOSIS — D509 Iron deficiency anemia, unspecified: Secondary | ICD-10-CM

## 2023-11-08 DIAGNOSIS — Z955 Presence of coronary angioplasty implant and graft: Secondary | ICD-10-CM

## 2023-11-08 DIAGNOSIS — R9389 Abnormal findings on diagnostic imaging of other specified body structures: Secondary | ICD-10-CM | POA: Diagnosis not present

## 2023-11-08 DIAGNOSIS — Z954 Presence of other heart-valve replacement: Secondary | ICD-10-CM | POA: Diagnosis not present

## 2023-11-08 DIAGNOSIS — Z7982 Long term (current) use of aspirin: Secondary | ICD-10-CM

## 2023-11-08 DIAGNOSIS — E7800 Pure hypercholesterolemia, unspecified: Secondary | ICD-10-CM | POA: Diagnosis not present

## 2023-11-08 DIAGNOSIS — N401 Enlarged prostate with lower urinary tract symptoms: Secondary | ICD-10-CM | POA: Diagnosis present

## 2023-11-08 DIAGNOSIS — N179 Acute kidney failure, unspecified: Secondary | ICD-10-CM | POA: Diagnosis not present

## 2023-11-08 DIAGNOSIS — Z7901 Long term (current) use of anticoagulants: Secondary | ICD-10-CM

## 2023-11-08 DIAGNOSIS — E875 Hyperkalemia: Secondary | ICD-10-CM | POA: Diagnosis not present

## 2023-11-08 DIAGNOSIS — R0609 Other forms of dyspnea: Secondary | ICD-10-CM | POA: Diagnosis not present

## 2023-11-08 DIAGNOSIS — K8020 Calculus of gallbladder without cholecystitis without obstruction: Secondary | ICD-10-CM | POA: Diagnosis present

## 2023-11-08 DIAGNOSIS — Z7951 Long term (current) use of inhaled steroids: Secondary | ICD-10-CM

## 2023-11-08 DIAGNOSIS — E663 Overweight: Secondary | ICD-10-CM | POA: Diagnosis present

## 2023-11-08 DIAGNOSIS — R590 Localized enlarged lymph nodes: Secondary | ICD-10-CM

## 2023-11-08 DIAGNOSIS — E162 Hypoglycemia, unspecified: Secondary | ICD-10-CM | POA: Diagnosis not present

## 2023-11-08 DIAGNOSIS — Z86718 Personal history of other venous thrombosis and embolism: Secondary | ICD-10-CM

## 2023-11-08 DIAGNOSIS — Z555 Less than a high school diploma: Secondary | ICD-10-CM

## 2023-11-08 DIAGNOSIS — R319 Hematuria, unspecified: Secondary | ICD-10-CM | POA: Diagnosis not present

## 2023-11-08 DIAGNOSIS — Z8249 Family history of ischemic heart disease and other diseases of the circulatory system: Secondary | ICD-10-CM

## 2023-11-08 DIAGNOSIS — R338 Other retention of urine: Secondary | ICD-10-CM | POA: Diagnosis not present

## 2023-11-08 DIAGNOSIS — Z6829 Body mass index (BMI) 29.0-29.9, adult: Secondary | ICD-10-CM

## 2023-11-08 DIAGNOSIS — E8809 Other disorders of plasma-protein metabolism, not elsewhere classified: Secondary | ICD-10-CM | POA: Diagnosis not present

## 2023-11-08 HISTORY — PX: TRANSESOPHAGEAL ECHOCARDIOGRAM (CATH LAB): EP1270

## 2023-11-08 HISTORY — DX: Presence of prosthetic heart valve: Z95.2

## 2023-11-08 HISTORY — PX: TRANSCATHETER AORTIC VALVE REPLACEMENT,SUBCLAVIAN: CATH118319

## 2023-11-08 LAB — POCT I-STAT, CHEM 8
BUN: 27 mg/dL — ABNORMAL HIGH (ref 8–23)
Calcium, Ion: 1.18 mmol/L (ref 1.15–1.40)
Chloride: 104 mmol/L (ref 98–111)
Creatinine, Ser: 1.6 mg/dL — ABNORMAL HIGH (ref 0.61–1.24)
Glucose, Bld: 116 mg/dL — ABNORMAL HIGH (ref 70–99)
HCT: 46 % (ref 39.0–52.0)
Hemoglobin: 15.6 g/dL (ref 13.0–17.0)
Potassium: 4.5 mmol/L (ref 3.5–5.1)
Sodium: 141 mmol/L (ref 135–145)
TCO2: 24 mmol/L (ref 22–32)

## 2023-11-08 LAB — ECHO TEE
AR max vel: 2.55 cm2
AV Area VTI: 2.47 cm2
AV Area mean vel: 2.52 cm2
AV Mean grad: 2 mm[Hg]
AV Peak grad: 4.3 mm[Hg]
Ao pk vel: 1.04 m/s

## 2023-11-08 LAB — PROTIME-INR
INR: 1.2 (ref 0.8–1.2)
Prothrombin Time: 15.3 s — ABNORMAL HIGH (ref 11.4–15.2)

## 2023-11-08 LAB — POCT ACTIVATED CLOTTING TIME: Activated Clotting Time: 365 s

## 2023-11-08 SURGERY — TRANSCATHETER AORTIC VALVE REPLACEMENT,SUBCLAVIAN (CATHLAB)
Anesthesia: General

## 2023-11-08 MED ORDER — SUCCINYLCHOLINE CHLORIDE 200 MG/10ML IV SOSY
PREFILLED_SYRINGE | INTRAVENOUS | Status: DC | PRN
  Administered 2023-11-08: 140 mg via INTRAVENOUS

## 2023-11-08 MED ORDER — LIDOCAINE 2% (20 MG/ML) 5 ML SYRINGE
INTRAMUSCULAR | Status: DC | PRN
  Administered 2023-11-08: 60 mg via INTRAVENOUS

## 2023-11-08 MED ORDER — GABAPENTIN 100 MG PO CAPS
100.0000 mg | ORAL_CAPSULE | Freq: Two times a day (BID) | ORAL | Status: DC
  Administered 2023-11-08 – 2023-11-10 (×5): 100 mg via ORAL
  Filled 2023-11-08 (×6): qty 1

## 2023-11-08 MED ORDER — ONDANSETRON HCL 4 MG/2ML IJ SOLN
4.0000 mg | Freq: Four times a day (QID) | INTRAMUSCULAR | Status: DC | PRN
  Administered 2023-11-08 – 2023-11-15 (×10): 4 mg via INTRAVENOUS
  Filled 2023-11-08 (×10): qty 2

## 2023-11-08 MED ORDER — GABAPENTIN 300 MG PO CAPS
300.0000 mg | ORAL_CAPSULE | Freq: Two times a day (BID) | ORAL | Status: DC
  Administered 2023-11-08 – 2023-11-16 (×16): 300 mg via ORAL
  Filled 2023-11-08 (×18): qty 1

## 2023-11-08 MED ORDER — VERAPAMIL HCL 2.5 MG/ML IV SOLN
INTRAVENOUS | Status: DC | PRN
  Administered 2023-11-08: 10 mL via INTRA_ARTERIAL

## 2023-11-08 MED ORDER — DEXAMETHASONE SODIUM PHOSPHATE 4 MG/ML IJ SOLN
INTRAMUSCULAR | Status: DC | PRN
  Administered 2023-11-08: 5 mg via INTRAVENOUS

## 2023-11-08 MED ORDER — PROPOFOL 10 MG/ML IV BOLUS
INTRAVENOUS | Status: DC | PRN
  Administered 2023-11-08: 90 mg via INTRAVENOUS

## 2023-11-08 MED ORDER — EMPAGLIFLOZIN 10 MG PO TABS
10.0000 mg | ORAL_TABLET | Freq: Every day | ORAL | Status: DC
  Administered 2023-11-08 – 2023-11-10 (×3): 10 mg via ORAL
  Filled 2023-11-08 (×3): qty 1

## 2023-11-08 MED ORDER — SUGAMMADEX SODIUM 200 MG/2ML IV SOLN
INTRAVENOUS | Status: DC | PRN
  Administered 2023-11-08: 200 mg via INTRAVENOUS

## 2023-11-08 MED ORDER — FENTANYL CITRATE (PF) 250 MCG/5ML IJ SOLN
INTRAMUSCULAR | Status: DC | PRN
  Administered 2023-11-08 (×2): 25 ug via INTRAVENOUS
  Administered 2023-11-08: 50 ug via INTRAVENOUS
  Administered 2023-11-08: 25 ug via INTRAVENOUS
  Administered 2023-11-08: 50 ug via INTRAVENOUS
  Administered 2023-11-08: 25 ug via INTRAVENOUS

## 2023-11-08 MED ORDER — SODIUM CHLORIDE 0.9 % IV SOLN: INTRAVENOUS | Status: AC

## 2023-11-08 MED ORDER — SODIUM CHLORIDE 0.9% FLUSH: 3.0000 mL | INTRAVENOUS | Status: DC | PRN

## 2023-11-08 MED ORDER — LACTATED RINGERS IV SOLN: INTRAVENOUS | Status: DC | PRN

## 2023-11-08 MED ORDER — IODIXANOL 320 MG/ML IV SOLN
INTRAVENOUS | Status: DC | PRN
  Administered 2023-11-08: 40 mL

## 2023-11-08 MED ORDER — LABETALOL HCL 5 MG/ML IV SOLN
INTRAVENOUS | Status: DC | PRN
  Administered 2023-11-08 (×2): 2.5 mg via INTRAVENOUS

## 2023-11-08 MED ORDER — CHLORHEXIDINE GLUCONATE 0.12 % MT SOLN
OROMUCOSAL | Status: AC
  Administered 2023-11-08: 15 mL via OROMUCOSAL
  Filled 2023-11-08: qty 15

## 2023-11-08 MED ORDER — FENTANYL CITRATE (PF) 100 MCG/2ML IJ SOLN
INTRAMUSCULAR | Status: AC
  Filled 2023-11-08: qty 2

## 2023-11-08 MED ORDER — OXYCODONE HCL 5 MG PO TABS
ORAL_TABLET | ORAL | Status: AC
  Administered 2023-11-08: 10 mg via ORAL
  Filled 2023-11-08: qty 2

## 2023-11-08 MED ORDER — ALLOPURINOL 300 MG PO TABS
300.0000 mg | ORAL_TABLET | Freq: Every day | ORAL | Status: DC
  Administered 2023-11-08 – 2023-11-10 (×3): 300 mg via ORAL
  Filled 2023-11-08 (×3): qty 1

## 2023-11-08 MED ORDER — HEPARIN (PORCINE) IN NACL 1000-0.9 UT/500ML-% IV SOLN
INTRAVENOUS | Status: DC | PRN
  Administered 2023-11-08: 1000 mL

## 2023-11-08 MED ORDER — ACETAMINOPHEN 325 MG PO TABS
ORAL_TABLET | ORAL | Status: AC
  Administered 2023-11-08: 650 mg via ORAL
  Filled 2023-11-08: qty 2

## 2023-11-08 MED ORDER — CHLORHEXIDINE GLUCONATE 4 % EX SOLN
60.0000 mL | Freq: Once | CUTANEOUS | Status: DC
Start: 2023-11-08 — End: 2023-11-08

## 2023-11-08 MED ORDER — NOREPINEPHRINE BITARTRATE 1 MG/ML IV SOLN
INTRAVENOUS | Status: DC | PRN
  Administered 2023-11-08: 1 mL via INTRAVENOUS

## 2023-11-08 MED ORDER — CHLORHEXIDINE GLUCONATE 4 % EX SOLN
30.0000 mL | CUTANEOUS | Status: DC
Start: 2023-11-08 — End: 2023-11-08

## 2023-11-08 MED ORDER — CILOSTAZOL 100 MG PO TABS
100.0000 mg | ORAL_TABLET | Freq: Every day | ORAL | Status: DC
  Administered 2023-11-08 – 2023-11-16 (×9): 100 mg via ORAL
  Filled 2023-11-08 (×10): qty 1

## 2023-11-08 MED ORDER — PROTAMINE SULFATE 10 MG/ML IV SOLN
INTRAVENOUS | Status: AC
  Filled 2023-11-08: qty 5

## 2023-11-08 MED ORDER — CEFAZOLIN SODIUM-DEXTROSE 2-4 GM/100ML-% IV SOLN
2.0000 g | Freq: Three times a day (TID) | INTRAVENOUS | Status: AC
  Administered 2023-11-08 – 2023-11-09 (×2): 2 g via INTRAVENOUS
  Filled 2023-11-08 (×2): qty 100

## 2023-11-08 MED ORDER — SODIUM CHLORIDE 0.9% FLUSH
3.0000 mL | Freq: Two times a day (BID) | INTRAVENOUS | Status: DC
  Administered 2023-11-09 – 2023-11-24 (×26): 3 mL via INTRAVENOUS

## 2023-11-08 MED ORDER — NITROGLYCERIN IN D5W 200-5 MCG/ML-% IV SOLN
INTRAVENOUS | Status: AC
  Filled 2023-11-08: qty 250

## 2023-11-08 MED ORDER — ACETAMINOPHEN 325 MG PO TABS: 650.0000 mg | ORAL_TABLET | Freq: Four times a day (QID) | ORAL | Status: DC | PRN

## 2023-11-08 MED ORDER — ONDANSETRON HCL 4 MG/2ML IJ SOLN
INTRAMUSCULAR | Status: DC | PRN
  Administered 2023-11-08: 4 mg via INTRAVENOUS

## 2023-11-08 MED ORDER — PROTAMINE SULFATE 10 MG/ML IV SOLN
INTRAVENOUS | Status: DC | PRN
  Administered 2023-11-08: 150 mg via INTRAVENOUS

## 2023-11-08 MED ORDER — LIDOCAINE HCL (PF) 1 % IJ SOLN
INTRAMUSCULAR | Status: AC
  Filled 2023-11-08: qty 30

## 2023-11-08 MED ORDER — ATORVASTATIN CALCIUM 10 MG PO TABS
20.0000 mg | ORAL_TABLET | Freq: Every day | ORAL | Status: DC
  Administered 2023-11-08 – 2023-11-16 (×9): 20 mg via ORAL
  Filled 2023-11-08 (×9): qty 2

## 2023-11-08 MED ORDER — HEPARIN SODIUM (PORCINE) 1000 UNIT/ML IJ SOLN
INTRAMUSCULAR | Status: DC | PRN
  Administered 2023-11-08: 17000 [IU] via INTRAVENOUS

## 2023-11-08 MED ORDER — HYDRALAZINE HCL 20 MG/ML IJ SOLN: 10.0000 mg | INTRAMUSCULAR | Status: DC | PRN

## 2023-11-08 MED ORDER — ROCURONIUM BROMIDE 10 MG/ML (PF) SYRINGE
PREFILLED_SYRINGE | INTRAVENOUS | Status: DC | PRN
  Administered 2023-11-08: 40 mg via INTRAVENOUS
  Administered 2023-11-08: 20 mg via INTRAVENOUS

## 2023-11-08 MED ORDER — SODIUM CHLORIDE 0.9 % IV SOLN: 250.0000 mL | INTRAVENOUS | Status: DC | PRN

## 2023-11-08 MED ORDER — VERAPAMIL HCL 2.5 MG/ML IV SOLN
INTRAVENOUS | Status: AC
Start: 2023-11-08 — End: ?
  Filled 2023-11-08: qty 2

## 2023-11-08 MED ORDER — NITROGLYCERIN IN D5W 200-5 MCG/ML-% IV SOLN
0.0000 ug/min | INTRAVENOUS | Status: DC
  Administered 2023-11-08: 5 ug/min via INTRAVENOUS

## 2023-11-08 MED ORDER — FLUTICASONE FUROATE-VILANTEROL 200-25 MCG/ACT IN AEPB
1.0000 | INHALATION_SPRAY | Freq: Every day | RESPIRATORY_TRACT | Status: DC
  Administered 2023-11-09 – 2023-11-24 (×15): 1 via RESPIRATORY_TRACT
  Filled 2023-11-08 (×2): qty 28

## 2023-11-08 MED ORDER — CHLORHEXIDINE GLUCONATE 0.12 % MT SOLN: 15.0000 mL | Freq: Once | OROMUCOSAL | Status: AC

## 2023-11-08 MED ORDER — OXYCODONE HCL 5 MG PO TABS
5.0000 mg | ORAL_TABLET | ORAL | Status: DC | PRN
  Administered 2023-11-08 – 2023-11-09 (×3): 10 mg via ORAL
  Filled 2023-11-08 (×3): qty 2

## 2023-11-08 MED ORDER — ONDANSETRON HCL 4 MG/2ML IJ SOLN
INTRAMUSCULAR | Status: AC
  Administered 2023-11-08: 4 mg
  Filled 2023-11-08: qty 2

## 2023-11-08 MED ORDER — SODIUM CHLORIDE 0.9 % IV SOLN: INTRAVENOUS | Status: DC

## 2023-11-08 MED ORDER — CHLORHEXIDINE GLUCONATE 4 % EX SOLN: 60.0000 mL | Freq: Once | CUTANEOUS | Status: DC

## 2023-11-08 MED ORDER — LIDOCAINE HCL (PF) 1 % IJ SOLN
INTRAMUSCULAR | Status: DC | PRN
  Administered 2023-11-08: 2 mL
  Administered 2023-11-08 (×2): 5 mL

## 2023-11-08 MED ORDER — METOPROLOL SUCCINATE ER 100 MG PO TB24
100.0000 mg | ORAL_TABLET | Freq: Every day | ORAL | Status: DC
  Administered 2023-11-08 – 2023-11-10 (×3): 100 mg via ORAL
  Filled 2023-11-08 (×3): qty 1

## 2023-11-08 MED ORDER — ACETAMINOPHEN 650 MG RE SUPP: 650.0000 mg | Freq: Four times a day (QID) | RECTAL | Status: DC | PRN

## 2023-11-08 MED ORDER — SODIUM CHLORIDE 0.9 % IV SOLN: INTRAVENOUS | Status: DC | PRN

## 2023-11-08 MED ORDER — FUROSEMIDE 20 MG PO TABS
20.0000 mg | ORAL_TABLET | Freq: Every day | ORAL | Status: DC
  Administered 2023-11-08: 20 mg via ORAL
  Filled 2023-11-08: qty 1

## 2023-11-08 MED ORDER — CEFAZOLIN SODIUM-DEXTROSE 2-3 GM-%(50ML) IV SOLR
INTRAVENOUS | Status: DC | PRN
  Administered 2023-11-08: 2 g via INTRAVENOUS

## 2023-11-08 MED ORDER — TAMSULOSIN HCL 0.4 MG PO CAPS
0.4000 mg | ORAL_CAPSULE | Freq: Every evening | ORAL | Status: DC
  Administered 2023-11-08 – 2023-11-11 (×4): 0.4 mg via ORAL
  Filled 2023-11-08 (×4): qty 1

## 2023-11-08 SURGICAL SUPPLY — 24 items
BAG SNAP BAND KOVER 36X36 (MISCELLANEOUS) ×4
CABLE ADAPT PACING TEMP 12FT (ADAPTER) ×2
CATH 26 ULTRA DELIVERY (CATHETERS) ×2
CATH DIAG 6FR PIGTAIL ANGLED (CATHETERS) ×2
CATH INFINITI 5FR ANG PIGTAIL (CATHETERS) ×4
CATH INFINITI 6F AL2 (CATHETERS) ×2
CATH S G BIP PACING (CATHETERS) ×2
CATH-GARD ARROW CATH SHIELD (MISCELLANEOUS) ×2 IMPLANT
CRIMPER (MISCELLANEOUS) ×2
DEVICE INFLATION ATRION QL2530 (MISCELLANEOUS) ×2
DEVICE RAD COMP TR BAND LRG (VASCULAR PRODUCTS) ×2
GLIDESHEATH SLEND SS 6F .021 (SHEATH) ×2
KIT SAPIAN 3 ULTRA RESILIA 26 (Valve) ×2 IMPLANT
PACK CARDIAC CATHETERIZATION (CUSTOM PROCEDURE TRAY) ×2
SET ATX-X65L (MISCELLANEOUS) ×2
SHEATH BRITE TIP 7FR 35CM (SHEATH) ×2
SHEATH INTRODUCER SET 20-26 (SHEATH) ×2
SHEATH PINNACLE 6F 10CM (SHEATH) ×2
STOPCOCK MORSE 400PSI 3WAY (MISCELLANEOUS) ×4
TUBING ART PRESS 72 MALE/FEM (TUBING) ×4
WIRE EMERALD 3MM-J .035X150CM (WIRE) ×2
WIRE EMERALD 3MM-J .035X260CM (WIRE) ×4
WIRE EMERALD ST .035X260CM (WIRE) ×2
WIRE SAFARI SM CURVE 275 (WIRE) ×2

## 2023-11-08 NOTE — Discharge Instructions (Signed)

## 2023-11-08 NOTE — Progress Notes (Signed)
  [facility]   MULTIDISCIPLINARY HEART VALVE TEAM  Patient doing well s/p TAVR. He is hemodynamically stable. Groin/subclavian/radial sites stable. ECG with sinus and no high grade block. BP elevated, will resume all home meds. Plan to transfer to from cath lab holding to 4E when bed available.  Early ambulation after bedrest completed and hopeful discharge over the next 24-48 hours.   Cline Crock PA-C  [facility]  Pager [phone]

## 2023-11-08 NOTE — Anesthesia Preprocedure Evaluation (Signed)
Anesthesia Evaluation  Patient identified by MRN, date of birth, ID band Patient awake    Reviewed: Allergy & Precautions, NPO status , Patient's Chart, lab work & pertinent test results  History of Anesthesia Complications Negative for: history of anesthetic complications  Airway Mallampati: IV  TM Distance: >3 FB Neck ROM: Limited    Dental  (+) Edentulous Upper, Edentulous Lower, Dental Advisory Given   Pulmonary shortness of breath, COPD, former smoker    + wheezing      Cardiovascular hypertension, Pt. on medications and Pt. on home beta blockers + CAD, + Past MI and + Peripheral Vascular Disease  + Valvular Problems/Murmurs AI, AS and MR  Rhythm:Regular + Systolic murmurs  1. The aortic valve is thickened and moderately calcified. Aortic valve  regurgitation is mild. Paradoxical Severe Low flow low gradient aortic  stenosis. Aortic regurgitation PHT measures 549 msec. Aortic valve area,  by VTI measures 0.67 cm. Aortic  valve mean gradient measures 31.8 mmHg. Aortic valve Vmax 3.53 m/s, SVI  28, DI 0.18, Indexed AVA 0.29.   2. Left ventricular ejection fraction, by estimation, is 60 to 65%. The  left ventricle has normal function. The left ventricle has no regional  wall motion abnormalities. There is mild concentric left ventricular  hypertrophy. Left ventricular diastolic  parameters are indeterminate.   3. Right ventricular systolic function is normal. The right ventricular  size is normal.   4. The mitral valve is degenerative. No evidence of mitral valve  regurgitation. No evidence of mitral stenosis.   5. The inferior vena cava is normal in size with greater than 50%  respiratory variability, suggesting right atrial pressure of 3 mmHg.   6. There is mild dilatation of the ascending aorta, measuring 40 mm.      Ost LAD to Mid LAD lesion is 50% stenosed.   Ost RCA to Prox RCA lesion is 80% stenosed.   Prox RCA  lesion is 25% stenosed.   Mid RCA lesion is 70% stenosed.   1st Diag lesion is 50% stenosed.   3rd Mrg lesion is 75% stenosed.   Dist RCA lesion is 50% stenosed.   Non-stenotic Mid LAD lesion was previously treated.   Hemodynamic findings consistent with moderate pulmonary hypertension.   1.  Severe calcific stenosis of the RCA, unchanged from the previous cardiac catheterization procedure 2.  Continued patency of the stented segment in the mid LAD with moderate nonobstructive plaquing in the proximal vessel 3.  Patent left circumflex with no high-grade stenosis 4.  Patent left subclavian artery with mild calcified stenosis 5.  Calcified aortic valve leaflets with known severe aortic stenosis by noninvasive assessment 6.  Moderate pulmonary hypertension with mean PA pressure 39 mmHg, transpulmonary gradient 20 mmHg, PVR 4.26 Wood units   Recommendations: Continue TAVR evaluation.  Medical therapy for CAD.  Hydration with repeat labs in the morning, overnight observation.    Neuro/Psych neg Seizures negative neurological ROS  negative psych ROS   GI/Hepatic negative GI ROS, Neg liver ROS,,,  Endo/Other  negative endocrine ROS  Lab Results      Component                Value               Date                      HGBA1C  5.5                 02/26/2023             Renal/GU CRFRenal diseaseLab Results      Component                Value               Date                      NA                       138                 11/04/2023                K                        4.9                 11/04/2023                CO2                      25                  11/04/2023                GLUCOSE                  105 (H)             11/04/2023                BUN                      27 (H)              11/04/2023                CREATININE               2.02 (H)            11/04/2023                CALCIUM                  9.5                 11/04/2023                 EGFR                     36 (L)              10/05/2023                GFRNONAA                 33 (L)              11/04/2023                Musculoskeletal  (+) Arthritis ,    Abdominal   Peds  Hematology Lab Results      Component                Value  Date                      WBC                      8.8                 11/04/2023                HGB                      17.6 (H)            11/04/2023                HCT                      54.7 (H)            11/04/2023                MCV                      97.3                11/04/2023                PLT                      256                 11/04/2023              Anesthesia Other Findings   Reproductive/Obstetrics                              Anesthesia Physical Anesthesia Plan  ASA: 4  Anesthesia Plan: General   Post-op Pain Management: Ofirmev IV (intra-op)*   Induction: Intravenous  PONV Risk Score and Plan: 2 and Ondansetron and Dexamethasone  Airway Management Planned: Oral ETT and Video Laryngoscope Planned  Additional Equipment: Arterial line  Intra-op Plan:   Post-operative Plan: Possible Post-op intubation/ventilation  Informed Consent: I have reviewed the patients History and Physical, chart, labs and discussed the procedure including the risks, benefits and alternatives for the proposed anesthesia with the patient or authorized representative who has indicated his/her understanding and acceptance.     Dental advisory given  Plan Discussed with: CRNA  Anesthesia Plan Comments:          Anesthesia Quick Evaluation

## 2023-11-08 NOTE — Progress Notes (Signed)
Patient brought to 4E from Cath Lab. VSS. Telemetry box applied, CCMD notified. CHG bath completed. Patient oriented to room and staff. Call bell in reach. ? ?Wallace Gappa L Dusti Tetro, RN  ?

## 2023-11-08 NOTE — Op Note (Signed)
[facility]   MULTIDISCIPLINARY HEART VALVE TEAM   TAVR OPERATIVE NOTE   Date of Procedure:  11/08/2023  Preoperative Diagnosis: Severe Aortic Stenosis   Postoperative Diagnosis: Same   Procedure:   Transcatheter Aortic Valve Replacement -  Open left subclavian artery approach  Edwards Sapien 3 Ultra Resilia THV (size 26 mm, model # 9755RSL, serial # [id number])   Co-Surgeons:  Alleen Borne, MD and Tonny Bollman, MD   Anesthesiologist:  C. Maple Hudson, MD  Echocardiographer:  Judie Petit. Croitoru, MD  Pre-operative Echo Findings: Severe aortic stenosis Normal left ventricular systolic function  Post-operative Echo Findings: No paravalvular leak Normal left ventricular systolic function   BRIEF CLINICAL NOTE AND INDICATIONS FOR SURGERY  78 yo male with NYHA class 2 symptoms of severe AS with preserved LV function. He has significant comorbidities of age, PVD, emphysema, PHTN, CAD, hypercoagulable state and is not a surgical candidate. His best option would be alternative access TAVR via the L subclavian approach. We discussed complications that might develop including but not limited to risks of death, stroke, paravalvular leak, aortic dissection or other major vascular complications, aortic annulus rupture, device embolization, cardiac rupture or perforation, mitral regurgitation, acute myocardial infarction, arrhythmia, heart block or bradycardia requiring permanent pacemaker placement, congestive heart failure, respiratory failure, renal failure, pneumonia, infection, other late complications related to structural valve deterioration or migration, or other complications that might ultimately cause a temporary or permanent loss of functional independence or other long term morbidity. The patient provides full informed consent for the procedure as described and all questions were answered.        DETAILS OF THE OPERATIVE PROCEDURE  PREPARATION:    The patient was  brought to the operating room on the above mentioned date and appropriate monitoring was established by the anesthesia team. The patient was placed in the supine position on the operating table.  Intravenous antibiotics were administered.  General endotracheal anesthesia was induced uneventfully.  Baseline transesophageal echocardiogram was performed. The patient's chest, abdomen and both groins were prepped and draped in a sterile manner. A time out procedure was performed.   PERIPHERAL ACCESS:    A 58F right radial artery sheath was inserted using micro-puncture technique. A 49F pigtail catheter was advanced through the sheath into the aortic root. Aortic root angiography was performed in order to determine the optimal angiographic angle for valve deployment.  Using the modified Seldinger technique, femoral venous access was obtained with placement of 6 Fr sheaths on the right side.  A temporary transvenous pacemaker catheter was passed through the  femoral venous sheath under fluoroscopic guidance into the right ventricle.  The pacemaker was tested to ensure stable lead placement and pacemaker capture.   LEFT SUBCLAVIAN ACCESS:   A transverse incision was made below the left clavicle and carried down through the subcutaneous tissue using electrocautery. The pectoralis major muscle was split along its fibers and the pectoralis minor muscle retracted laterally. The left axillary artery was identified and encircled with a vessel loop. The patient was heparinized systemically and ACT verified > 250 seconds.  A double concentric purse string suture of CV-4 gortex was placed in the anterior wall of the artery. The artery was cannulated with a needle and a J- wire advanced into the ascending aorta. An 8 F sheath was inserted over the wire. The aortic valve was crossed with a JR4 catheter and a straight wire. This was exchanged for a pigtail catheter and position was confirmed in the LV  apex. Simultaneous LV  and Ao pressures were recorded.  The pigtail catheter was exchanged for a Safari wire in the LV apex.  Then a 66F E-sheath was inserted into the axillary artery and the tip advanced into the aortic arch.  BALLOON AORTIC VALVULOPLASTY:   Not performed.  TRANSCATHETER HEART VALVE DEPLOYMENT:   An Edwards Sapien 3 Ultra transcatheter heart valve (size 26 mm) was prepared and crimped per manufacturer's guidelines, and the proper orientation of the valve is confirmed on the [employer] delivery system. The valve was advanced through the introducer sheath using normal technique until in an appropriate position in the ascending aorta beyond the sheath tip. The balloon was then retracted and using the fine-tuning wheel was centered on the valve. The valve was then advanced across the aortic arch using appropriate flexion of the catheter. The valve was carefully positioned across the aortic valve annulus. The Commander catheter was retracted using normal technique. Once final position of the valve has been confirmed by angiographic assessment, the valve is deployed while temporarily holding ventilation and during rapid ventricular pacing to maintain systolic blood pressure < 50 mmHg and pulse pressure < 10 mmHg. The balloon inflation is held for >3 seconds after reaching full deployment volume. Once the balloon has fully deflated the balloon is retracted into the ascending aorta and valve function is assessed using echocardiography. There is felt to be no paravalvular leak and no central aortic insufficiency. Post-procedural gradient  was 4 mm Hg. The patient's hemodynamic recovery following valve deployment is good.  The deployment balloon and guidewire are both removed.     PROCEDURE COMPLETION:   The sheath was removed and axillary artery closure performed.  Protamine was administered once axillary arterial repair was complete. The temporary pacemaker, pigtail catheter and femoral sheath were removed  with manual pressure used for venous hemostasis.  A TR band was utilized following removal of the diagnostic sheath in the right radial artery.  The patient tolerated the procedure well and is transported to the cath lab recovery area in stable condition. There were no immediate intraoperative complications. All sponge instrument and needle counts are verified correct at completion of the operation.   No blood products were administered during the operation.  The patient received a total of 40 mL of intravenous contrast during the procedure.   Alleen Borne, MD 11/08/2023

## 2023-11-08 NOTE — Transfer of Care (Signed)
Immediate Anesthesia Transfer of Care Note  Patient: Tony Zhang  Procedure(s) Performed: Transcatheter Aortic Valve Replacement-Subclavian (Left) TRANSESOPHAGEAL ECHOCARDIOGRAM  Patient Location: Cath Lab  Anesthesia Type:General  Level of Consciousness: awake, alert , oriented, and patient cooperative  Airway & Oxygen Therapy: Patient Spontanous Breathing and Patient connected to nasal cannula oxygen  Post-op Assessment: Report given to RN, Post -op Vital signs reviewed and stable, and Patient moving all extremities  Post vital signs: Reviewed and stable  Last Vitals:  Vitals Value Taken Time  BP 154/61 11/08/23 1513  Temp    Pulse 72 11/08/23 1514  Resp 15 11/08/23 1514  SpO2 97 % 11/08/23 1514  Vitals shown include unfiled device data.  Last Pain:  Vitals:   11/08/23 1457  TempSrc:   PainSc: Asleep      Patients Stated Pain Goal: 2 (11/08/23 1311)  Complications: There were no known notable events for this encounter.

## 2023-11-08 NOTE — Op Note (Signed)
[facility]   MULTIDISCIPLINARY HEART VALVE TEAM   TAVR OPERATIVE NOTE   Date of Procedure:  11/08/2023  Preoperative Diagnosis: Severe Aortic Stenosis   Postoperative Diagnosis: Same   Procedure:   Transcatheter Aortic Valve Replacement - Open left subclavian Approach  Edwards Sapien 3 Ultra Resilia THV (size 26 mm, serial # [id number])   Co-Surgeons:  Evelene Croon, MD and Tonny Bollman, MD  Anesthesiologist:  Corky Sox, MD  Echocardiographer:  Thurmon Fair, MD  Pre-operative Echo Findings: Severe aortic stenosis Normal left ventricular systolic function  Post-operative Echo Findings: No paravalvular leak Normal left ventricular systolic function  BRIEF CLINICAL NOTE AND INDICATIONS FOR SURGERY  Medically complex 78 year old gentleman with progressive aortic stenosis and preserved LV function.  The patient has severe comorbid conditions including peripheral arterial disease with an occluded distal aorta, emphysema, hypercoagulable state on chronic oral anticoagulation.  He has chronic kidney disease stage IIIb.  He presents today for TAVR via a left subclavian approach in the context of his distal aortic occlusion.  He has been evaluated by our multidisciplinary heart valve team and he is deemed a nonoperative candidate for open surgery.  During the course of the patient's preoperative work up they have been evaluated comprehensively by a multidisciplinary team of specialists coordinated through the Multidisciplinary Heart Valve Clinic in the [facility].  They have been demonstrated to suffer from symptomatic severe aortic stenosis as noted above. The patient has been counseled extensively as to the relative risks and benefits of all options for the treatment of severe aortic stenosis including long term medical therapy, conventional surgery for aortic valve replacement, and transcatheter aortic valve replacement.  The patient has been  independently evaluated in formal cardiac surgical consultation by Dr Leafy Ro, who deemed the patient appropriate for TAVR. Based upon review of all of the patient's preoperative diagnostic tests they are felt to be candidate for transcatheter aortic valve replacement using the open subclavian approach as an alternative to conventional surgery.    Following the decision to proceed with transcatheter aortic valve replacement, a discussion has been held regarding what types of management strategies would be attempted intraoperatively in the event of life-threatening complications, including whether or not the patient would be considered a candidate for the use of cardiopulmonary bypass and/or conversion to open sternotomy for attempted surgical intervention.  The patient has been advised of a variety of complications that might develop peculiar to this approach including but not limited to risks of death, stroke, paravalvular leak, aortic dissection or other major vascular complications, aortic annulus rupture, device embolization, cardiac rupture or perforation, acute myocardial infarction, arrhythmia, heart block or bradycardia requiring permanent pacemaker placement, congestive heart failure, respiratory failure, renal failure, pneumonia, infection, other late complications related to structural valve deterioration or migration, or other complications that might ultimately cause a temporary or permanent loss of functional independence or other long term morbidity.  The patient provides full informed consent for the procedure as described and all questions were answered preoperatively.  DETAILS OF THE OPERATIVE PROCEDURE  PREPARATION:   The patient is brought to the operating room on the above mentioned date and central monitoring was established by the anesthesia team. The patient is placed in the supine position on the operating table.  Intravenous antibiotics are administered. General endotracheal  anesthesia is induced uneventfully.    Baseline transesophageal echocardiogram is performed. The patient's chest, abdomen, both groins, and both lower extremities are prepared and draped in a sterile  manner. A time out procedure is performed.   PERIPHERAL ACCESS:   Using ultrasound guidance, femoral venous access is obtained with placement of a long 7 French venous sheath on the right side.  The right radial artery is accessed via a front wall puncture and a 5/6 [place] sheath is inserted in the right radial artery.  [place] images are digitally captured and stored in the patient's chart. A pigtail diagnostic catheter was passed through the radial arterial sheath under fluoroscopic guidance into the aortic root.  A temporary transvenous pacemaker catheter was passed through the femoral venous sheath under fluoroscopic guidance into the right ventricle.  The pacemaker was tested to ensure stable lead placement and pacemaker capture. Aortic root angiography was performed in order to determine the optimal angiographic angle for valve deployment.  SUBCLAVIAN ACCESS:  Please see the surgical note of Dr Laneta Simmers.  An incision is made and the subclavian artery is visualized.  Once pursestring sutures are in place by Dr. Laneta Simmers, the patient is fully heparinized and the artery is accessed.  A 6 French sheath is placed.  The aortic valve is crossed with an AL-2 catheter and a straight wire.  This is changed out for a pigtail catheter where LVEDP is recorded at 28 mmHg.  The pigtail catheter was then changed out for a safari wire which was placed in the LV apex.   BALLOON AORTIC VALVULOPLASTY:  Not performed  TRANSCATHETER HEART VALVE DEPLOYMENT:  An Edwards Sapien 3 Ultra Resilia transcatheter heart valve (size 26 mm) was prepared and crimped per manufacturer's guidelines, and the proper orientation of the valve is confirmed on the [employer] delivery system. The valve was advanced through the introducer sheath  using normal technique until in an appropriate position in the ascending aorta beyond the sheath tip. The balloon was then retracted and using the fine-tuning wheel was centered on the valve. The valve was then advanced into the aortic valve using appropriate flexion of the catheter. The valve was carefully positioned across the aortic valve annulus. The Commander catheter was retracted using normal technique. Once final position of the valve has been confirmed by angiographic assessment, the valve is deployed while temporarily holding ventilation and during rapid ventricular pacing to maintain systolic blood pressure < 50 mmHg and pulse pressure < 10 mmHg. The balloon inflation is held for >3 seconds after reaching full deployment volume. Once the balloon has fully deflated the balloon is retracted into the ascending aorta and valve function is assessed using echocardiography. The patient's hemodynamic recovery following valve deployment is good.  The deployment balloon and guidewire are both removed. Echo demostrated acceptable post-procedural gradients, stable mitral valve function, and no aortic insufficiency.    PROCEDURE COMPLETION:  The temporary pacemaker and pigtail catheters are removed. A TR band is used for radial hemostasis. Manual pressure is used for femoral venous hemostasis.    The patient tolerated the procedure well and is transported to the recovery area in stable condition. There were no immediate intraoperative complications. All sponge instrument and needle counts are verified correct at completion of the operation.   EBL: minimal  LVEDP: 28 mmHg   Tonny Bollman, MD 11/08/2023 3:51 PM

## 2023-11-08 NOTE — Anesthesia Postprocedure Evaluation (Signed)
Anesthesia Post Note  Patient: Tony Zhang  Procedure(s) Performed: Transcatheter Aortic Valve Replacement-Subclavian (Left) TRANSESOPHAGEAL ECHOCARDIOGRAM     Patient location during evaluation: Cath Lab Anesthesia Type: General Level of consciousness: awake and patient cooperative Pain management: pain level controlled Vital Signs Assessment: post-procedure vital signs reviewed and stable Respiratory status: spontaneous breathing, nonlabored ventilation, respiratory function stable and patient connected to nasal cannula oxygen Cardiovascular status: blood pressure returned to baseline and stable Postop Assessment: no apparent nausea or vomiting Anesthetic complications: no   There were no known notable events for this encounter.  Last Vitals:  Vitals:   11/08/23 1545 11/08/23 1550  BP: (!) 167/79 (!) 170/69  Pulse: 70 66  Resp: 12 17  Temp:    SpO2: 93% 94%    Last Pain:  Vitals:   11/08/23 1550  TempSrc:   PainSc: 10-Worst pain ever                 Elidia Bonenfant

## 2023-11-08 NOTE — Interval H&P Note (Signed)
History and Physical Interval Note:  11/08/2023 12:07 PM  Tony Zhang  has presented today for surgery, with the diagnosis of Severe Aortic Stenosis.  The various methods of treatment have been discussed with the patient and family. After consideration of risks, benefits and other options for treatment, the patient has consented to  Procedure(s): Transcatheter Aortic Valve Replacement-Subclavian (Left) TRANSESOPHAGEAL ECHOCARDIOGRAM (N/A) as a surgical intervention.  The patient's history has been reviewed, patient examined, no change in status, stable for surgery.  I have reviewed the patient's chart and labs.  Questions were answered to the patient's satisfaction.     Alleen Borne

## 2023-11-08 NOTE — Progress Notes (Signed)
  Echocardiogram Echocardiogram Transesophageal has been performed.  Delcie Roch 11/08/2023, 3:00 PM

## 2023-11-09 ENCOUNTER — Encounter (HOSPITAL_COMMUNITY): Payer: Self-pay | Admitting: Cardiovascular Disease

## 2023-11-09 ENCOUNTER — Inpatient Hospital Stay (HOSPITAL_COMMUNITY): Payer: Medicare Other

## 2023-11-09 DIAGNOSIS — Z006 Encounter for examination for normal comparison and control in clinical research program: Secondary | ICD-10-CM | POA: Diagnosis not present

## 2023-11-09 DIAGNOSIS — Z952 Presence of prosthetic heart valve: Secondary | ICD-10-CM

## 2023-11-09 DIAGNOSIS — Z954 Presence of other heart-valve replacement: Secondary | ICD-10-CM | POA: Diagnosis not present

## 2023-11-09 DIAGNOSIS — I5033 Acute on chronic diastolic (congestive) heart failure: Secondary | ICD-10-CM | POA: Diagnosis not present

## 2023-11-09 DIAGNOSIS — I350 Nonrheumatic aortic (valve) stenosis: Secondary | ICD-10-CM | POA: Diagnosis not present

## 2023-11-09 DIAGNOSIS — J9621 Acute and chronic respiratory failure with hypoxia: Secondary | ICD-10-CM | POA: Diagnosis not present

## 2023-11-09 LAB — BASIC METABOLIC PANEL
Anion gap: 11 (ref 5–15)
BUN: 24 mg/dL — ABNORMAL HIGH (ref 8–23)
CO2: 20 mmol/L — ABNORMAL LOW (ref 22–32)
Calcium: 8.4 mg/dL — ABNORMAL LOW (ref 8.9–10.3)
Chloride: 101 mmol/L (ref 98–111)
Creatinine, Ser: 1.61 mg/dL — ABNORMAL HIGH (ref 0.61–1.24)
GFR, Estimated: 44 mL/min — ABNORMAL LOW (ref >60)
Glucose, Bld: 117 mg/dL — ABNORMAL HIGH (ref 70–99)
Potassium: 4.3 mmol/L (ref 3.5–5.1)
Sodium: 132 mmol/L — ABNORMAL LOW (ref 135–145)

## 2023-11-09 LAB — ECHOCARDIOGRAM COMPLETE
AR max vel: 1.5 cm2
AV Area VTI: 1.51 cm2
AV Area mean vel: 1.51 cm2
AV Mean grad: 5 mm[Hg]
AV Peak grad: 9.9 mm[Hg]
Ao pk vel: 1.58 m/s
Area-P 1/2: 2.9 cm2
Height: 73 in
MV VTI: 1.11 cm2
S' Lateral: 3 cm
Weight: 3476.8 [oz_av]

## 2023-11-09 LAB — CBC
HCT: 48.8 % (ref 39.0–52.0)
Hemoglobin: 16.3 g/dL (ref 13.0–17.0)
MCH: 31.8 pg (ref 26.0–34.0)
MCHC: 33.4 g/dL (ref 30.0–36.0)
MCV: 95.1 fL (ref 80.0–100.0)
Platelets: 235 10*3/uL (ref 150–400)
RBC: 5.13 MIL/uL (ref 4.22–5.81)
RDW: 15.9 % — ABNORMAL HIGH (ref 11.5–15.5)
WBC: 9.4 10*3/uL (ref 4.0–10.5)
nRBC: 0 % (ref 0.0–0.2)

## 2023-11-09 LAB — MAGNESIUM: Magnesium: 1.6 mg/dL — ABNORMAL LOW (ref 1.7–2.4)

## 2023-11-09 MED ORDER — ACETAMINOPHEN 500 MG PO TABS
1000.0000 mg | ORAL_TABLET | Freq: Four times a day (QID) | ORAL | Status: DC | PRN
  Administered 2023-11-09 – 2023-11-16 (×15): 1000 mg via ORAL
  Filled 2023-11-09 (×15): qty 2

## 2023-11-09 MED ORDER — POTASSIUM CHLORIDE CRYS ER 20 MEQ PO TBCR
20.0000 meq | EXTENDED_RELEASE_TABLET | Freq: Once | ORAL | Status: AC
  Administered 2023-11-09: 20 meq via ORAL
  Filled 2023-11-09: qty 1

## 2023-11-09 MED ORDER — FUROSEMIDE 10 MG/ML IJ SOLN
40.0000 mg | Freq: Once | INTRAMUSCULAR | Status: AC
  Administered 2023-11-09: 40 mg via INTRAVENOUS
  Filled 2023-11-09: qty 4

## 2023-11-09 NOTE — Progress Notes (Signed)
CARDIAC REHAB PHASE I     Post TAVR education including site care, restrictions, risk factors, heart healthy diet, exercise guidelines and CRP2 reviewed. All questions and concerns addressed. Pt not interested in CRP2 at this time.   [id number] Woodroe Chen, RN BSN 11/09/2023 9:18 AM

## 2023-11-09 NOTE — Progress Notes (Signed)
   11/09/23 1038  Pain Assessment  Pain Scale 0-10  Pain Score 7  Pain Intervention(s) Medication (See eMAR);Pain med given for lower pain score than stated, per patient request   PT requesting tylenol for pain score of 7

## 2023-11-09 NOTE — Progress Notes (Signed)
1 Day Post-Op Procedure(s) (LRB): Transcatheter Aortic Valve Replacement-Subclavian (Left) TRANSESOPHAGEAL ECHOCARDIOGRAM (N/A) Subjective:  Soreness around incision and left shoulder which is chronic. Some nausea this am after narcotic. Wants to switch to Tylenol.  Objective: Vital signs in last 24 hours: Temp:  [97.9 F (36.6 C)-98.6 F (37 C)] 98.1 F (36.7 C) (01/22 0451) Pulse Rate:  [0-94] 83 (01/22 0451) Cardiac Rhythm: Normal sinus rhythm (01/21 2000) Resp:  [11-38] 14 (01/22 0451) BP: (102-184)/(61-107) 129/61 (01/22 0451) SpO2:  [88 %-100 %] 99 % (01/22 0451) Weight:  [98.6 kg-102.1 kg] 98.6 kg (01/22 0451)  Hemodynamic parameters for last 24 hours:    Intake/Output from previous day: 01/21 0701 - 01/22 0700 In: 1237.9 [I.V.:1037.9; IV Piggyback:200] Out: 820 [Urine:800; Blood:20] Intake/Output this shift: No intake/output data recorded.  General appearance: alert and cooperative Neurologic: intact Heart: regular rate and rhythm Lungs: clear to auscultation bilaterally Extremities: left arm neurovascularly intact with strong radial pulse. Wound: incision has mild soft tissue swelling and ecchymosis but looks ok. It is soft.  Lab Results: Recent Labs    11/08/23 1436 11/09/23 0309  WBC  --  9.4  HGB 15.6 16.3  HCT 46.0 48.8  PLT  --  235   BMET:  Recent Labs    11/08/23 1436 11/09/23 0309  NA 141 132*  K 4.5 4.3  CL 104 101  CO2  --  20*  GLUCOSE 116* 117*  BUN 27* 24*  CREATININE 1.60* 1.61*  CALCIUM  --  8.4*    PT/INR:  Recent Labs    11/08/23 1010  LABPROT 15.3*  INR 1.2   ABG    Component Value Date/Time   PHART 7.405 09/13/2020 1301   HCO3 24.1 09/28/2023 1118   HCO3 23.5 09/28/2023 1118   TCO2 24 11/08/2023 1436   ACIDBASEDEF 1.0 09/28/2023 1118   ACIDBASEDEF 2.0 09/28/2023 1118   O2SAT 65 09/28/2023 1118   O2SAT 64 09/28/2023 1118   CBG (last 3)  No results for input(s): "GLUCAP" in the last 72 hours.  ECG: NSR, no  acute changes, no heart block  Assessment/Plan: S/P Procedure(s) (LRB): Transcatheter Aortic Valve Replacement-Subclavian (Left) TRANSESOPHAGEAL ECHOCARDIOGRAM (N/A)  He is hemodynamically stable in sinus rhythm.  2D echo today.  He needs a day to move around and recover before going home since he lives alone with no family or support system.  DC narcotics and use Tylenol for pain  I think Coumadin can be resumed at discharge.   LOS: 1 day    Alleen Borne 11/09/2023

## 2023-11-09 NOTE — Progress Notes (Signed)
Echocardiogram 2D Echocardiogram has been performed.  Tony Zhang 11/09/2023, 11:04 AM

## 2023-11-10 ENCOUNTER — Inpatient Hospital Stay (HOSPITAL_COMMUNITY): Payer: Medicare Other

## 2023-11-10 ENCOUNTER — Other Ambulatory Visit (HOSPITAL_COMMUNITY): Payer: Medicare Other

## 2023-11-10 DIAGNOSIS — I2510 Atherosclerotic heart disease of native coronary artery without angina pectoris: Secondary | ICD-10-CM

## 2023-11-10 DIAGNOSIS — I5033 Acute on chronic diastolic (congestive) heart failure: Secondary | ICD-10-CM | POA: Diagnosis not present

## 2023-11-10 DIAGNOSIS — K859 Acute pancreatitis without necrosis or infection, unspecified: Secondary | ICD-10-CM | POA: Diagnosis not present

## 2023-11-10 DIAGNOSIS — R0609 Other forms of dyspnea: Secondary | ICD-10-CM | POA: Diagnosis not present

## 2023-11-10 DIAGNOSIS — Z952 Presence of prosthetic heart valve: Secondary | ICD-10-CM

## 2023-11-10 DIAGNOSIS — I350 Nonrheumatic aortic (valve) stenosis: Secondary | ICD-10-CM | POA: Diagnosis not present

## 2023-11-10 DIAGNOSIS — I5031 Acute diastolic (congestive) heart failure: Secondary | ICD-10-CM

## 2023-11-10 DIAGNOSIS — I959 Hypotension, unspecified: Secondary | ICD-10-CM | POA: Diagnosis not present

## 2023-11-10 DIAGNOSIS — Z006 Encounter for examination for normal comparison and control in clinical research program: Secondary | ICD-10-CM | POA: Diagnosis not present

## 2023-11-10 DIAGNOSIS — N179 Acute kidney failure, unspecified: Secondary | ICD-10-CM | POA: Diagnosis not present

## 2023-11-10 DIAGNOSIS — N1832 Chronic kidney disease, stage 3b: Secondary | ICD-10-CM

## 2023-11-10 DIAGNOSIS — J9621 Acute and chronic respiratory failure with hypoxia: Secondary | ICD-10-CM | POA: Diagnosis not present

## 2023-11-10 DIAGNOSIS — I739 Peripheral vascular disease, unspecified: Secondary | ICD-10-CM

## 2023-11-10 LAB — BASIC METABOLIC PANEL
Anion gap: 13 (ref 5–15)
BUN: 37 mg/dL — ABNORMAL HIGH (ref 8–23)
CO2: 21 mmol/L — ABNORMAL LOW (ref 22–32)
Calcium: 8.6 mg/dL — ABNORMAL LOW (ref 8.9–10.3)
Chloride: 95 mmol/L — ABNORMAL LOW (ref 98–111)
Creatinine, Ser: 2.56 mg/dL — ABNORMAL HIGH (ref 0.61–1.24)
GFR, Estimated: 25 mL/min — ABNORMAL LOW (ref >60)
Glucose, Bld: 138 mg/dL — ABNORMAL HIGH (ref 70–99)
Potassium: 4.2 mmol/L (ref 3.5–5.1)
Sodium: 129 mmol/L — ABNORMAL LOW (ref 135–145)

## 2023-11-10 LAB — ECHOCARDIOGRAM LIMITED
AR max vel: 1.51 cm2
AV Area VTI: 1.94 cm2
AV Area mean vel: 1.38 cm2
AV Mean grad: 15 mm[Hg]
AV Peak grad: 21.9 mm[Hg]
Ao pk vel: 2.34 m/s
Height: 73 in
Weight: 3476.8 [oz_av]

## 2023-11-10 LAB — CBC
HCT: 49 % (ref 39.0–52.0)
Hemoglobin: 16.4 g/dL (ref 13.0–17.0)
MCH: 31.5 pg (ref 26.0–34.0)
MCHC: 33.5 g/dL (ref 30.0–36.0)
MCV: 94 fL (ref 80.0–100.0)
Platelets: 243 10*3/uL (ref 150–400)
RBC: 5.21 MIL/uL (ref 4.22–5.81)
RDW: 16.1 % — ABNORMAL HIGH (ref 11.5–15.5)
WBC: 14.8 10*3/uL — ABNORMAL HIGH (ref 4.0–10.5)
nRBC: 0 % (ref 0.0–0.2)

## 2023-11-10 LAB — HEPATIC FUNCTION PANEL
ALT: 9 U/L (ref 0–44)
AST: 46 U/L — ABNORMAL HIGH (ref 15–41)
Albumin: 3.5 g/dL (ref 3.5–5.0)
Alkaline Phosphatase: 41 U/L (ref 38–126)
Bilirubin, Direct: 0.5 mg/dL — ABNORMAL HIGH (ref 0.0–0.2)
Indirect Bilirubin: 1.3 mg/dL — ABNORMAL HIGH (ref 0.3–0.9)
Total Bilirubin: 1.8 mg/dL — ABNORMAL HIGH (ref 0.0–1.2)
Total Protein: 6.9 g/dL (ref 6.5–8.1)

## 2023-11-10 LAB — LIPASE, BLOOD: Lipase: 666 U/L — ABNORMAL HIGH (ref 11–51)

## 2023-11-10 LAB — C-REACTIVE PROTEIN: CRP: 14.3 mg/dL — ABNORMAL HIGH (ref <1.0)

## 2023-11-10 LAB — PROTIME-INR
INR: 1.1 (ref 0.8–1.2)
Prothrombin Time: 14.5 s (ref 11.4–15.2)

## 2023-11-10 LAB — LACTIC ACID, PLASMA
Lactic Acid, Venous: 1.2 mmol/L (ref 0.5–1.9)
Lactic Acid, Venous: 1.5 mmol/L (ref 0.5–1.9)

## 2023-11-10 LAB — AMYLASE: Amylase: 495 U/L — ABNORMAL HIGH (ref 28–100)

## 2023-11-10 MED ORDER — WARFARIN - PHARMACIST DOSING INPATIENT: Freq: Every day | Status: DC

## 2023-11-10 MED ORDER — SODIUM CHLORIDE 0.9 % IV SOLN: INTRAVENOUS | Status: DC

## 2023-11-10 MED ORDER — SODIUM CHLORIDE 0.9 % IV BOLUS
250.0000 mL | Freq: Once | INTRAVENOUS | Status: AC
  Administered 2023-11-10: 250 mL via INTRAVENOUS

## 2023-11-10 MED ORDER — WARFARIN SODIUM 2 MG PO TABS
2.0000 mg | ORAL_TABLET | Freq: Once | ORAL | Status: AC
  Administered 2023-11-10: 2 mg via ORAL
  Filled 2023-11-10: qty 1

## 2023-11-10 MED FILL — Fentanyl Citrate Preservative Free (PF) Inj 100 MCG/2ML: INTRAMUSCULAR | Qty: 4 | Status: AC

## 2023-11-10 NOTE — Progress Notes (Addendum)
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 Hasn't had a bowel movement in 3 days. Had some dizziness with low BPs.  Inpatient Medications    Scheduled Meds:  allopurinol  300 mg Oral QHS   atorvastatin  20 mg Oral Daily   cilostazol  100 mg Oral Daily   empagliflozin  10 mg Oral Daily   fluticasone furoate-vilanterol  1 puff Inhalation Daily   gabapentin  100 mg Oral BID   gabapentin  300 mg Oral BID   metoprolol succinate  100 mg Oral Daily   sodium chloride flush  3 mL Intravenous Q12H   tamsulosin  0.4 mg Oral QPM   Continuous Infusions:  sodium chloride     PRN Meds: sodium chloride, acetaminophen, hydrALAZINE, ondansetron (ZOFRAN) IV, sodium chloride flush   Vital Signs    Vitals:   11/10/23 0305 11/10/23 0309 11/10/23 0805 11/10/23 0811  BP: 99/67  (!) 128/100 (!) 83/42  Pulse: 86 88 93 97  Resp: 20 14 15 18   Temp: 99.6 F (37.6 C)  98.4 F (36.9 C)   TempSrc: Oral  Oral   SpO2: 90% 91% 93% 92%  Weight:      Height:        Intake/Output Summary (Last 24 hours) at 11/10/2023 0902 Last data filed at  11/09/2023 1957 Gross per 24 hour  Intake --  Output 400 ml  Net -400 ml   Filed Weights   11/08/23 0906 11/09/23 0451  Weight: 102.1 kg 98.6 kg    Physical Exam    GEN: Well nourished, well developed, in no acute distress.  HEENT: Grossly normal.  Neck: Supple, no JVD Cardiac: RRR, no murmurs, rubs, or gallops. No clubbing, cyanosis, edema.   Respiratory:  Respirations regular and unlabored, clear to auscultation bilaterally. GI: tender to palpation MS: no deformity or atrophy. Skin: warm and dry, no rash. Subclavian, radial, groin sites are stable.  Neuro:  Strength and sensation are intact. Psych: AAOx3.  Normal affect.  Labs    CBC Recent Labs    11/09/23 0309 11/10/23 0308  WBC 9.4 14.8*  HGB 16.3 16.4  HCT 48.8 49.0  MCV 95.1 94.0  PLT 235 243   Basic Metabolic Panel Recent Labs    16/10/96 0309 11/10/23 0308  NA 132* 129*  K 4.3 4.2  CL 101 95*  CO2 20* 21*  GLUCOSE 117* 138*  BUN 24* 37*  CREATININE 1.61* 2.56*  CALCIUM 8.4* 8.6*  MG 1.6*  --     Telemetry    Sinus with rates 80-low 100s- Personally Reviewed  ECG    Sinus with inferior/anteroseptal q waves, HR  75- Personally Reviewed  Cardiac Studies   TAVR OPERATIVE NOTE     Date of Procedure:                11/08/2023   Preoperative Diagnosis:      Severe Aortic Stenosis    Postoperative Diagnosis:    Same    Procedure:        Transcatheter Aortic Valve Replacement - Open left subclavian Approach             Edwards Sapien 3 Ultra Resilia THV (size 26 mm, serial # [id number])              Co-Surgeons:                        Evelene Croon, MD and Tonny Bollman, MD   Anesthesiologist:                  Corky Sox, MD   Echocardiographer:              Thurmon Fair, MD   Pre-operative Echo Findings: Severe aortic stenosis Normal left ventricular systolic function   Post-operative Echo Findings: No paravalvular leak Normal left ventricular systolic function _____________      Echo 11/09/23: IMPRESSIONS   1. Left ventricular ejection fraction, by estimation, is 65 to 70%. The  left ventricle has normal function. The left ventricle has no regional  wall motion abnormalities. There is mild left ventricular hypertrophy.  Left ventricular diastolic parameters  are indeterminate.   2. Right ventricular systolic function is normal. The right ventricular  size is normal. Tricuspid regurgitation signal is inadequate for assessing  PA pressure.   3. The mitral valve is normal in structure. Trivial mitral valve  regurgitation.   4. The aortic valve has been repaired/replaced. Aortic valve  regurgitation is not visualized. There is a 26 mm Edwards Sapien  prosthetic, stented (TAVR) valve present in the aortic      Procedure Date: 11/08/23. Echo findings are consistent with normal  structure and function of the aortic valve prosthesis. Vmax 1.6 m/s, MG  , EOA 2.1 cm^2, DI 0.74   Patient Profile     Tony Zhang is a 78 y.o. 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   Assessment & Plan    Severe AS: s/p successful TAVR with a 26 mm Edwards Sapien 3 Ultra Resilia THV via the TF approach on 11/09/23. Post operative echo showed EF 65%, normally functioning TAVR with a mean gradient of 5 mmHg and no PVL. Groin sites are stable. ECG with sinus and no high grade heart block. Resume home Coumadin.   Symptomatic hypotension: 02 sats 86-92%, HR low 100s, now in 80s after Toprol given. White count mildly elevated. Will get a stat limited echo to rule out effusion or right heart strain with hx of recurrent PE. He denies leg pain/swelling.  Acute on chronic HFpEF: as evidenced by an elevated LVEDP ~28 mm hg at the time of TAVR. Treated with one dose of IV lasix 40mg /KDur . Resumed on home Jardiance 10mg  daily &  Toprol-XL 100 mg daily. Hold Toprol with symptomatic hypotension. Hold off on anymore diuresis.   CKD stage IIIb: baseline creat 1.6-2.37. Creat bumped from 1.61--> 2.56 and pt with symptomatic hypotension. Stop all diuretics and gently  hydrate.  Hyponatremia: Na 132--> 129. Follow   CAD: s/p DES to LAD 2023. Pre op [facility] 09/28/23 showed severe calcified stenosis of the RCA unchanged from prior in 2023, patent mid LAD stent with moderate nonobstructive disease proximally, patent left circumflex, and patent subclavian with mild stenosis. Plan for medical management of CAD. Continue on atorvastatin 40 mg daily, Repatha 140mg  q 2weeks. No aspirin given chronic OAC.    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 Continue medical management with Pletal 100mg  daily, atorvastatin 40 mg daily, Repatha 140mg  q 2weeks and Toprol-XL 100 mg. No aspirin given chronic OAC.    DVT/PE: resume home Coumadin.    Abdominal pain: will check a lactate and get a non contrast CT abdomen.   Byrd Hesselbach, PA-C  11/10/2023, 9:02 AM  Pager [phone]

## 2023-11-10 NOTE — Consult Note (Signed)
Initial Consultation Note   Patient: Tony Zhang:[id number] DOB: 11-11-1945 PCP: System, Provider Not In DOA: 11/08/2023 DOS: the patient was seen and examined on 11/10/2023 Primary service: Tonny Bollman, MD  Referring physician: Lennie Odor  Reason for consult: Pancreatitis  Assessment/Plan: Assessment and Plan:  Severe aortic stenosis S/p TAVR postop day 2  Pancreatitis Acute.  Patient reported to have acute onset of abdominal pain. CT scan of the abdomen pelvis concerning for acute pancreatitis with secondary inflammation of the duodenum and trace free fluid.  Gallbladder was seen to have small gallstones with no significant gallbladder inflammation.  Records note triglycerides were last noted to be 172 when checked on 09/22/2023.  No significant -Diet as tolerated -Follow-up lipase -Normal saline IV fluids at 75 mL/h -Antiemetics as needed -Tylenol as needed for pain  Acute kidney injury superimposed on chronic kidney disease stage IIIb Patient creatinine noted to be elevated up to 2.56 with BUN 37 after receiving Lasix 40 mg IV x 1 dose yesterday morning.  Creatinine was 1.61 yesterday. -Fluids as noted above -Avoid possible nephrotoxic agents -Recheck kidney function in a.m.  Transient hypotension Blood pressures noted to be as low as 81/51.  Suspect secondary to fluid losses. -IV fluids as noted above -Avoid blood pressure lowering medications at this time.  TR[facility] will continue to follow the patient. HPI: Tony Zhang is a 78 y.o. male with past medical history of hypertension, dyslipidemia, CAD, severe aortic stenosis, PVD, history of recurrent venous thrombosis, presented for transcatheter aortic valve replacement.  Following the procedure performed on 1/21 patient reported developing severe abdominal pain in the upper region, central and radiating to the left side, two days after surgery. The pain is persistent and does not move. Accompanying the pain is a feeling  of nausea, for which medication was administered. However, the medication seems to induce excessive sleepiness. The patient denies any history of alcohol use.  There is no prior history of pancreatitis, a condition that the recent CT scan suggests. The patient also reports a decrease in appetite, consuming minimal food. Pain management is currently through Tylenol, as other pain medications have previously caused stomach upset. The patient's last bowel movement was least 2 days ago.  Labs today note creatinine 2.56 with BUN 37.  Creatinine was noted to be 1.61 with BUN 27 yesterday.  Records note patient had been given Lasix 40 mg IV and was noted to be transiently hypotensive.    Review of Systems: As mentioned in the history of present illness. All other systems reviewed and are negative. Past Medical History:  Diagnosis Date   AAA (abdominal aortic aneurysm) (HCC)    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     CAD (coronary artery disease)    COPD (chronic obstructive pulmonary disease) (HCC)    DJD (degenerative joint disease)    Dyslipidemia    Gout    Malignant hypertension    Peripheral vascular disease (HCC)    Pneumonia 08/18/2013   pt. 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 Excell Seltzer and Dr. Laneta Simmers   Severe aortic stenosis    Venous thrombosis    Recurrent   Past Surgical History:  Procedure Laterality Date  APPENDECTOMY     BACK SURGERY     COLONOSCOPY N/A 04/13/2015   Procedure: COLONOSCOPY;  Surgeon: Carman Ching, MD;  Location: [facility] ENDOSCOPY;  Service: Endoscopy;  Laterality: N/A;   CORONARY STENT INTERVENTION N/A 06/04/2022   Procedure: CORONARY STENT INTERVENTION;  Surgeon: Corky Crafts, MD;   Location: [facility] INVASIVE CV LAB;  Service: Cardiovascular;  Laterality: N/A;   EMPYEMA DRAINAGE N/A 11/25/2014   Procedure: EMPYEMA DRAINAGE;  Surgeon: Delight Ovens, MD;  Location: [facility] OR;  Service: Thoracic;  Laterality: N/A;   ESOPHAGOGASTRODUODENOSCOPY N/A 04/10/2015   Procedure: ESOPHAGOGASTRODUODENOSCOPY (EGD);  Surgeon: Charlott Rakes, MD;  Location: [facility] ENDOSCOPY;  Service: Endoscopy;  Laterality: N/A;   EYE SURGERY     both eyes, cataracts removed, denies lens implants   FLEXIBLE SIGMOIDOSCOPY N/A 04/12/2015   Procedure: FLEXIBLE SIGMOIDOSCOPY;  Surgeon: Carman Ching, MD;  Location: [facility] ENDOSCOPY;  Service: Endoscopy;  Laterality: N/A;   HERNIA REPAIR     LEFT HEART CATH AND CORONARY ANGIOGRAPHY N/A 06/04/2022   Procedure: LEFT HEART CATH AND CORONARY ANGIOGRAPHY;  Surgeon: Corky Crafts, MD;  Location: [facility] INVASIVE CV LAB;  Service: Cardiovascular;  Laterality: N/A;   RIGHT HEART CATH AND CORONARY ANGIOGRAPHY N/A 09/28/2023   Procedure: RIGHT HEART CATH AND CORONARY ANGIOGRAPHY;  Surgeon: Tonny Bollman, MD;  Location: [facility] INVASIVE CV LAB;  Service: Cardiovascular;  Laterality: N/A;   SHOULDER SURGERY Right    TRANSCATHETER AORTIC VALVE REPLACEMENT,SUBCLAVIAN Left 11/08/2023   Procedure: Transcatheter Aortic Valve Replacement-Subclavian;  Surgeon: Tonny Bollman, MD;  Location: [facility] INVASIVE CV LAB;  Service: Cardiovascular;  Laterality: Left;   TRANSESOPHAGEAL ECHOCARDIOGRAM (CATH LAB) N/A 11/08/2023   Procedure: TRANSESOPHAGEAL ECHOCARDIOGRAM;  Surgeon: Tonny Bollman, MD;  Location: [facility] INVASIVE CV LAB;  Service: Cardiovascular;  Laterality: N/A;   VIDEO ASSISTED THORACOSCOPY Right 11/25/2014   Procedure: VIDEO ASSISTED THORACOSCOPY;  Surgeon: Delight Ovens, MD;  Location: [facility] OR;  Service: Thoracic;  Laterality: Right;   VIDEO BRONCHOSCOPY N/A 11/25/2014   Procedure: VIDEO BRONCHOSCOPY;  Surgeon: Delight Ovens, MD;  Location: [facility] OR;  Service: Thoracic;  Laterality: N/A;    Social History:  reports that he quit smoking about 31 years ago. His smoking use included cigarettes. He has never used smokeless tobacco. He reports that he does not drink alcohol and does not use drugs.  Allergies  Allergen Reactions   Codeine Hives and Other (See Comments)    Takes benadryl to stop allergic reactions    Family History  Problem Relation Age of Onset   CAD Father 25   Liver cancer Brother    CAD Sister     Prior to Admission medications   Medication Sig Start Date End Date Taking? Authorizing Provider  acetaminophen (TYLENOL) 500 MG tablet Take 500 mg by mouth in the morning.   Yes [provider]  albuterol (VENTOLIN HFA) 108 (90 Base) MCG/ACT inhaler Inhale 2 puffs into the lungs See admin instructions. Inhale 2 puffs by mouth 2-3 times a day   Yes [provider]  allopurinol (ZYLOPRIM) 300 MG tablet Take 300 mg by mouth at bedtime.   Yes [provider]  atorvastatin (LIPITOR) 20 MG tablet Take 1 tablet (20 mg total) by mouth daily. 10/05/23  Yes Croitoru, Mihai, MD  budesonide-formoterol (SYMBICORT) 160-4.5 MCG/ACT inhaler Inhale 2 puffs into the lungs in the morning and at bedtime. Patient taking differently: Inhale 2 puffs into the lungs daily. 02/28/23  Yes Almon Hercules, MD  Choline Fenofibrate (FENOFIBRIC ACID)  135 MG CPDR TAKE 1 TABLET BY MOUTH DAILY 09/26/23  Yes Tonny Bollman, MD  cilostazol (PLETAL) 100 MG tablet Take 100 mg by mouth daily.   Yes [provider]  cyanocobalamin 1000 MCG tablet Take 1 tablet (1,000 mcg total) by mouth daily. 03/03/23  Yes Almon Hercules, MD  empagliflozin (JARDIANCE) 10 MG TABS tablet Take 1 tablet (10 mg total) by mouth daily. 08/25/23  Yes Tonny Bollman, MD  Evolocumab (REPATHA SURECLICK) 140 MG/ML SOAJ Inject 140 mg into the skin every 14 (fourteen) days. 12/08/22  Yes Croitoru, Mihai, MD  furosemide (LASIX) 20 MG tablet Take 1 tablet (20 mg total) by mouth daily. 08/25/23  Yes  Tonny Bollman, MD  gabapentin (NEURONTIN) 100 MG capsule Take 100 mg by mouth 2 (two) times daily. 10/31/23  Yes [provider]  gabapentin (NEURONTIN) 300 MG capsule Take 1 capsule (300 mg total) by mouth 2 (two) times daily. 07/11/23  Yes Jodelle Gross, NP  metoprolol succinate (TOPROL-XL) 100 MG 24 hr tablet Take 1 tablet (100 mg total) by mouth daily. 08/25/23  Yes Tonny Bollman, MD  nitroGLYCERIN (NITROSTAT) 0.3 MG SL tablet Place under tongue. May take every 5 minutes up to 3 doses for chest pain. If still having pain after 3 doses, call 911. 08/25/23  Yes Tonny Bollman, MD  ondansetron (ZOFRAN) 8 MG tablet Take 8 mg by mouth 2 (two) times daily as needed for vomiting or nausea. 05/25/22  Yes [provider]  pyridOXINE (B-6) 50 MG tablet Take 1 tablet (50 mg total) by mouth daily. 02/28/23  Yes Almon Hercules, MD  tamsulosin (FLOMAX) 0.4 MG CAPS capsule Take 0.4 mg by mouth every evening.   Yes [provider]  warfarin (COUMADIN) 2 MG tablet Take 2-4 mg by mouth See admin instructions. Take 2 tablets (4mg ) by mouth Saturday and Sunday, then take 1 tablet (2mg ) all other days   Yes [provider]  clopidogrel (PLAVIX) 75 MG tablet Take 75 mg by mouth daily. Patient not taking: Reported on 11/04/2023 10/28/23   [provider]  sodium zirconium cyclosilicate (LOKELMA) 10 g PACK packet Take 10 g by mouth 3 (three) times daily. Take 1 packet three times a Day for 48 Hours Patient not taking: Reported on 11/04/2023 09/23/23   Tonny Bollman, MD    Physical Exam: Vitals:   11/10/23 0805 11/10/23 0811 11/10/23 0915 11/10/23 1112  BP: (!) 128/100 (!) 83/42 (!) 81/50 (!) 92/56  Pulse: 93 97 91 88  Resp: 15 18 17 15   Temp: 98.4 F (36.9 C) 98.4 F (36.9 C)  98.5 F (36.9 C)  TempSrc: Oral Oral  Oral  SpO2: 93% 92% 93% 94%  Weight:      Height:        Constitutional: Elderly male who appears to be in some discomfort. Eyes: PERRL, lids and  conjunctivae normal ENMT: Mucous membranes are moist. Posterior pharynx clear of any exudate or lesions.Normal dentition.  Neck: normal, supple,  Respiratory: clear to auscultation bilaterally, no wheezing, no crackles. Normal respiratory effort.  Patient currently on 2 L of nasal cannula oxygen. Cardiovascular: Regular rate and rhythm, no murmurs / rubs / gallops. No extremity edema.  carotid bruits.  Abdomen: Tenderness to palpation epigastrically.  No significant guarding or peritoneal signs.  Bowel sounds present in all 4 quadrants. Musculoskeletal: no clubbing / cyanosis. No joint deformity upper and lower extremities. Good ROM, no contractures. Normal muscle tone.  Skin: no rashes, lesions, ulcers. No  induration Neurologic: CN 2-12 grossly intact.   Strength 5/5 in all 4.  Psychiatric: Normal judgment and insight. Alert and oriented x 3. Normal mood.   Data Reviewed:   Reviewed labs, imaging, and pertinent records as documented   Family Communication: None Primary team communication:  Thank you very much for involving [place] in the care of your patient.  Author: Clydie Braun, MD 11/10/2023 12:12 PM  For on call review [email or web address].

## 2023-11-10 NOTE — Progress Notes (Signed)
PHARMACY - ANTICOAGULATION CONSULT NOTE  Pharmacy Consult for Warfarin Indication: VTE history  Allergies  Allergen Reactions   Codeine Hives and Other (See Comments)    Takes benadryl to stop allergic reactions    Patient Measurements: Height: 6\' 1"  (185.4 cm) Weight: 98.6 kg (217 lb 4.8 oz) IBW/kg (Calculated) : 79.9  Vital Signs: Temp: 98.4 F (36.9 C) (01/23 0805) Temp Source: Oral (01/23 0805) BP: 83/42 (01/23 0811) Pulse Rate: 97 (01/23 0811)  Labs: Recent Labs    11/08/23 1010 11/08/23 1436 11/08/23 1436 11/09/23 0309 11/10/23 0308  HGB  --  15.6   < > 16.3 16.4  HCT  --  46.0  --  48.8 49.0  PLT  --   --   --  235 243  LABPROT 15.3*  --   --   --   --   INR 1.2  --   --   --   --   CREATININE  --  1.60*  --  1.61* 2.56*   < > = values in this interval not displayed.    Estimated Creatinine Clearance: 29.4 mL/min (A) (by C-G formula based on SCr of 2.56 mg/dL (H)).   Medical History: Past Medical History:  Diagnosis Date   AAA (abdominal aortic aneurysm) (HCC)    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     CAD (coronary artery disease)    COPD (chronic obstructive pulmonary disease) (HCC)    DJD (degenerative joint disease)    Dyslipidemia    Gout    Malignant hypertension    Peripheral vascular disease (HCC)    Pneumonia 08/18/2013   pt. 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 Excell Seltzer and Dr. Laneta Simmers   Severe aortic stenosis    Venous thrombosis    Recurrent    Assessment: 78 year old male s/p TAVR on warfarin prior to admission. Dose prior to admission 4 mg Sat, Sun, 2 mg other days  Goal of Therapy:  INR 2-3 Monitor platelets by anticoagulation protocol: Yes   Plan:   Warfarin 2 mg po x 1 dose today Daily INR  Thank you Okey Regal, PharmD  11/10/2023,9:28 AM

## 2023-11-10 NOTE — Progress Notes (Signed)
   11/10/23 0811  Vitals  Temp 98.4 F (36.9 C)  Temp Source Oral  BP (!) 83/42  MAP (mmHg) (!) 56  BP Location Left Arm  BP Method Automatic  Patient Position (if appropriate) Lying  Pulse Rate 97  ECG Heart Rate 96  Resp 18  Level of Consciousness  Level of Consciousness Alert  Oxygen Therapy  SpO2 92 %  O2 Device Nasal Cannula  O2 Flow Rate (L/min) 2 L/min   Patient BP 83/42. Complaining of abdominal pain and dizziness. PA notified.

## 2023-11-10 NOTE — Progress Notes (Addendum)
   11/10/23 2155  Vitals  BP (!) 80/43  MAP (mmHg) (!) 53  BP Location Right Arm  BP Method Automatic  Patient Position (if appropriate) Lying  Pulse Rate 87  Pulse Rate Source Monitor  ECG Heart Rate 86  Resp 20  Level of Consciousness  Level of Consciousness Alert  MEWS COLOR  MEWS Score Color Yellow  Oxygen Therapy  SpO2 93 %  O2 Device Nasal Cannula  O2 Flow Rate (L/min) 5 L/min  Patient Activity (if Appropriate) In bed  Pulse Oximetry Type Continuous  Pain Assessment  Pain Scale 0-10  Pain Score 7  Pain Type Acute pain  Pain Location Abdomen  Pain Orientation Mid  Pain Descriptors / Indicators Sharp  Pain Frequency Intermittent  Pain Onset With Activity  MEWS Score  MEWS Temp 0  MEWS Systolic 2  MEWS Pulse 0  MEWS RR 0  MEWS LOC 0  MEWS Score 2   Stephanie RN aware. Patient resting in bed, C./o abdo. Pain. Dr Regino Schultze text paged.  And came to see patient

## 2023-11-11 ENCOUNTER — Other Ambulatory Visit: Payer: Self-pay | Admitting: Cardiothoracic Surgery

## 2023-11-11 ENCOUNTER — Inpatient Hospital Stay (HOSPITAL_COMMUNITY): Payer: Medicare Other

## 2023-11-11 DIAGNOSIS — I350 Nonrheumatic aortic (valve) stenosis: Secondary | ICD-10-CM | POA: Diagnosis not present

## 2023-11-11 DIAGNOSIS — Z952 Presence of prosthetic heart valve: Secondary | ICD-10-CM | POA: Diagnosis not present

## 2023-11-11 DIAGNOSIS — K859 Acute pancreatitis without necrosis or infection, unspecified: Secondary | ICD-10-CM | POA: Diagnosis not present

## 2023-11-11 LAB — URINALYSIS, ROUTINE W REFLEX MICROSCOPIC
Bacteria, UA: NONE SEEN
Bilirubin Urine: NEGATIVE
Glucose, UA: 150 mg/dL — AB
Ketones, ur: NEGATIVE mg/dL
Leukocytes,Ua: NEGATIVE
Nitrite: NEGATIVE
Protein, ur: NEGATIVE mg/dL
Specific Gravity, Urine: 1.024 (ref 1.005–1.030)
pH: 5 (ref 5.0–8.0)

## 2023-11-11 LAB — BASIC METABOLIC PANEL
Anion gap: 12 (ref 5–15)
BUN: 52 mg/dL — ABNORMAL HIGH (ref 8–23)
CO2: 18 mmol/L — ABNORMAL LOW (ref 22–32)
Calcium: 8 mg/dL — ABNORMAL LOW (ref 8.9–10.3)
Chloride: 105 mmol/L (ref 98–111)
Creatinine, Ser: 3.84 mg/dL — ABNORMAL HIGH (ref 0.61–1.24)
GFR, Estimated: 15 mL/min — ABNORMAL LOW (ref >60)
Glucose, Bld: 67 mg/dL — ABNORMAL LOW (ref 70–99)
Potassium: 4.5 mmol/L (ref 3.5–5.1)
Sodium: 135 mmol/L (ref 135–145)

## 2023-11-11 LAB — CBC
HCT: 44.7 % (ref 39.0–52.0)
Hemoglobin: 14.7 g/dL (ref 13.0–17.0)
MCH: 31.5 pg (ref 26.0–34.0)
MCHC: 32.9 g/dL (ref 30.0–36.0)
MCV: 95.9 fL (ref 80.0–100.0)
Platelets: 183 10*3/uL (ref 150–400)
RBC: 4.66 MIL/uL (ref 4.22–5.81)
RDW: 15.9 % — ABNORMAL HIGH (ref 11.5–15.5)
WBC: 15 10*3/uL — ABNORMAL HIGH (ref 4.0–10.5)
nRBC: 0 % (ref 0.0–0.2)

## 2023-11-11 LAB — COMPREHENSIVE METABOLIC PANEL
ALT: 7 U/L (ref 0–44)
AST: 19 U/L (ref 15–41)
Albumin: 2.5 g/dL — ABNORMAL LOW (ref 3.5–5.0)
Alkaline Phosphatase: 37 U/L — ABNORMAL LOW (ref 38–126)
Anion gap: 13 (ref 5–15)
BUN: 53 mg/dL — ABNORMAL HIGH (ref 8–23)
CO2: 16 mmol/L — ABNORMAL LOW (ref 22–32)
Calcium: 7.7 mg/dL — ABNORMAL LOW (ref 8.9–10.3)
Chloride: 103 mmol/L (ref 98–111)
Creatinine, Ser: 3.16 mg/dL — ABNORMAL HIGH (ref 0.61–1.24)
GFR, Estimated: 19 mL/min — ABNORMAL LOW (ref >60)
Glucose, Bld: 57 mg/dL — ABNORMAL LOW (ref 70–99)
Potassium: 4.1 mmol/L (ref 3.5–5.1)
Sodium: 132 mmol/L — ABNORMAL LOW (ref 135–145)
Total Bilirubin: 1.2 mg/dL (ref 0.0–1.2)
Total Protein: 5.9 g/dL — ABNORMAL LOW (ref 6.5–8.1)

## 2023-11-11 LAB — HEPATIC FUNCTION PANEL
ALT: 6 U/L (ref 0–44)
AST: 24 U/L (ref 15–41)
Albumin: 2.7 g/dL — ABNORMAL LOW (ref 3.5–5.0)
Alkaline Phosphatase: 35 U/L — ABNORMAL LOW (ref 38–126)
Bilirubin, Direct: 0.5 mg/dL — ABNORMAL HIGH (ref 0.0–0.2)
Indirect Bilirubin: 1 mg/dL — ABNORMAL HIGH (ref 0.3–0.9)
Total Bilirubin: 1.5 mg/dL — ABNORMAL HIGH (ref 0.0–1.2)
Total Protein: 6.1 g/dL — ABNORMAL LOW (ref 6.5–8.1)

## 2023-11-11 LAB — GLUCOSE, CAPILLARY
Glucose-Capillary: 91 mg/dL (ref 70–99)
Glucose-Capillary: 55 mg/dL — ABNORMAL LOW (ref 70–99)
Glucose-Capillary: 115 mg/dL — ABNORMAL HIGH (ref 70–99)
Glucose-Capillary: 51 mg/dL — ABNORMAL LOW (ref 70–99)
Glucose-Capillary: 57 mg/dL — ABNORMAL LOW (ref 70–99)
Glucose-Capillary: 100 mg/dL — ABNORMAL HIGH (ref 70–99)

## 2023-11-11 LAB — PROTIME-INR
INR: 1.2 (ref 0.8–1.2)
Prothrombin Time: 15.9 s — ABNORMAL HIGH (ref 11.4–15.2)

## 2023-11-11 LAB — MAGNESIUM: Magnesium: 1.5 mg/dL — ABNORMAL LOW (ref 1.7–2.4)

## 2023-11-11 LAB — LIPASE, BLOOD: Lipase: 205 U/L — ABNORMAL HIGH (ref 11–51)

## 2023-11-11 LAB — PHOSPHORUS: Phosphorus: 4.2 mg/dL (ref 2.5–4.6)

## 2023-11-11 MED ORDER — LACTATED RINGERS IV SOLN: INTRAVENOUS | Status: AC

## 2023-11-11 MED ORDER — SODIUM CHLORIDE 0.9 % IV BOLUS: 500.0000 mL | Freq: Once | INTRAVENOUS | Status: AC

## 2023-11-11 MED ORDER — WARFARIN SODIUM 2 MG PO TABS: 2.0000 mg | ORAL_TABLET | ORAL | Status: DC

## 2023-11-11 MED ORDER — CHLORHEXIDINE GLUCONATE CLOTH 2 % EX PADS
6.0000 | MEDICATED_PAD | Freq: Every day | CUTANEOUS | Status: DC
  Administered 2023-11-11 – 2023-11-24 (×14): 6 via TOPICAL

## 2023-11-11 MED ORDER — SODIUM BICARBONATE 8.4 % IV SOLN: 50.0000 meq | Freq: Once | INTRAVENOUS | Status: AC

## 2023-11-11 MED ORDER — WARFARIN SODIUM 5 MG PO TABS
5.0000 mg | ORAL_TABLET | Freq: Once | ORAL | Status: DC
  Filled 2023-11-11: qty 1

## 2023-11-11 MED ORDER — DEXTROSE 50 % IV SOLN
50.0000 mL | INTRAVENOUS | Status: DC | PRN
  Administered 2023-11-11 – 2023-11-12 (×2): 50 mL via INTRAVENOUS
  Filled 2023-11-11 (×2): qty 50

## 2023-11-11 MED ORDER — SODIUM CHLORIDE 0.9 % IV BOLUS
500.0000 mL | Freq: Once | INTRAVENOUS | Status: AC
  Administered 2023-11-11: 500 mL via INTRAVENOUS

## 2023-11-11 MED ORDER — ENOXAPARIN SODIUM 30 MG/0.3ML IJ SOSY
30.0000 mg | PREFILLED_SYRINGE | Freq: Every day | INTRAMUSCULAR | Status: DC
  Administered 2023-11-11 – 2023-11-12 (×2): 30 mg via SUBCUTANEOUS
  Filled 2023-11-11 (×2): qty 0.3

## 2023-11-11 MED ORDER — ENOXAPARIN SODIUM 40 MG/0.4ML IJ SOSY: 40.0000 mg | PREFILLED_SYRINGE | Freq: Every day | INTRAMUSCULAR | Status: DC

## 2023-11-11 MED ORDER — SODIUM BICARBONATE 8.4 % IV SOLN
INTRAVENOUS | Status: AC
  Administered 2023-11-11: 50 meq via INTRAVENOUS
  Filled 2023-11-11: qty 50

## 2023-11-11 MED ORDER — GABAPENTIN 100 MG PO CAPS
100.0000 mg | ORAL_CAPSULE | Freq: Two times a day (BID) | ORAL | Status: DC
  Administered 2023-11-14 – 2023-11-16 (×6): 100 mg via ORAL
  Filled 2023-11-11 (×6): qty 1

## 2023-11-11 MED ORDER — SODIUM CHLORIDE 0.9 % IV SOLN: 250.0000 mL | INTRAVENOUS | Status: DC | PRN

## 2023-11-11 MED ORDER — WARFARIN SODIUM 2 MG PO TABS: 4.0000 mg | ORAL_TABLET | ORAL | Status: DC

## 2023-11-11 MED ORDER — LACTATED RINGERS IV BOLUS
1000.0000 mL | Freq: Once | INTRAVENOUS | Status: AC
  Administered 2023-11-11: 1000 mL via INTRAVENOUS

## 2023-11-11 NOTE — Progress Notes (Signed)
   11/11/23 0234  Vitals  BP (!) 89/34  MAP (mmHg) (!) 49  BP Location Left Arm  BP Method Automatic  Patient Position (if appropriate) Lying  Pulse Rate 81  Pulse Rate Source Monitor  ECG Heart Rate 83  Resp 20  Level of Consciousness  Level of Consciousness Alert  MEWS COLOR  MEWS Score Color Green  Oxygen Therapy  SpO2 92 %  O2 Device Nasal Cannula  O2 Flow Rate (L/min) 5 L/min  Pain Assessment  Pain Scale 0-10  Pain Score Asleep  MEWS Score  MEWS Temp 0  MEWS Systolic 1  MEWS Pulse 0  MEWS RR 0  MEWS LOC 0  MEWS Score 1   Stephane Rn aware. Dr. Regino Schultze called and aware and will come see patient. Patient is resting well.  See new orders.

## 2023-11-11 NOTE — Significant Event (Signed)
Rapid Response Event Note   Reason for Call :  Decreased LOC and CBG 55  Initial Focused Assessment:  Patient is drowsy but awakes and answers questions easily BP 99/40  MAP 57 HR 90 RR 20 O2 sat 91% on 5L [place]  Patient now with transfer orders to ICU   CBG 55   Interventions:  1 amp D 50  Recheck CBG 115  Plan of Care:     Event Summary:   MD Notified: Samara Deist PA notified by Rn Call Time: 1353 Arrival Time: End [id number]  Marcellina Millin, RN

## 2023-11-11 NOTE — Progress Notes (Signed)
Text page Dr Regino Schultze of decrease urine output Bladder scan 240 ml . Patient has not voided since sometime on day shift. Patient does not feel uncomfort.

## 2023-11-11 NOTE — Progress Notes (Addendum)
   11/10/23 1917  Vitals  Temp 98.9 F (37.2 C)  Temp Source Oral  BP (!) 87/46  MAP (mmHg) (!) 59  BP Location Left Arm  BP Method Automatic  Patient Position (if appropriate) Lying  Pulse Rate 93  Pulse Rate Source Monitor  ECG Heart Rate 90  Resp 20  Level of Consciousness  Level of Consciousness Alert  MEWS COLOR  MEWS Score Color Green  Oxygen Therapy  O2 Device Nasal Cannula  O2 Flow Rate (L/min) 2 L/min  MEWS Score  MEWS Temp 0  MEWS Systolic 1  MEWS Pulse 0  MEWS RR 0  MEWS LOC 0  MEWS Score 1  Rapid Response Notification  Name of Rapid Response RN Notified Nikki RN  Date Rapid Response Notified 11/10/23  Time Rapid Response Notified 1920   Stephane Rn and  rapid response nurse Maxine Glenn and Dr. Royann Shivers  Notified of the above Vital signs and had to increase O2 to 5 liters due to low SATS 87%.

## 2023-11-11 NOTE — Progress Notes (Signed)
   11/11/23 1305  Vitals  Temp 98.1 F (36.7 C)  Temp Source Oral  BP (!) 93/45  MAP (mmHg) (!) 58  BP Location Left Arm  BP Method Automatic  Patient Position (if appropriate) Lying  Pulse Rate 85  Pulse Rate Source Monitor  ECG Heart Rate 84  Level of Consciousness  Level of Consciousness Alert  MEWS COLOR  MEWS Score Color Green  Oxygen Therapy  SpO2 93 %  O2 Device Nasal Cannula  O2 Flow Rate (L/min) 3 L/min  MEWS Score  MEWS Temp 0  MEWS Systolic 1  MEWS Pulse 0  MEWS RR 0  MEWS LOC 0  MEWS Score 1   MD notified of Patient BP. Urine output for shift 200. Bladder scan 0. New orders received.

## 2023-11-11 NOTE — Progress Notes (Signed)
PROGRESS NOTE    Tony Zhang  MWN:[id number] DOB: [birth year] DOA: 11/08/2023 PCP: System, Provider Not In   Brief Narrative:  Tony Zhang is a 78 y.o. male with past medical history of hypertension, dyslipidemia, CAD, severe aortic stenosis, PVD, history of recurrent venous thrombosis, presented for transcatheter aortic valve replacement.  Following the procedure performed on 1/21 patient reported developing severe abdominal pain in the upper region, central and radiating to the left side, two days after surgery. The pain is persistent and does not move. Accompanying the pain is a feeling of nausea, for which medication was administered. However, the medication seems to induce excessive sleepiness. The patient denies any history of alcohol use.   There is no prior history of pancreatitis, a condition that the recent CT scan suggests. The patient also reports a decrease in appetite, consuming minimal food. Pain management is currently through Tylenol, as other pain medications have previously caused stomach upset. The patient's last bowel movement was least 2 days ago.  Labs today note creatinine 2.56 with BUN 37.  Creatinine was noted to be 1.61 with BUN 27 yesterday.  Records note patient had been given Lasix 40 mg IV and was noted to be transiently hypotensive.   With his progressive decline throughout the day today the primary team transferred to the ICU and consulted critical care.  He will be continued IV fluid hydration and will continue supportive care.  TRH will resume care once patient is out of the ICU and off of the critical care service for now.  Assessment and Plan:  Acute Pancreatitis -CT Abd/Pelvis done and showed " Acute Pancreatitis. Secondary inflammation of the duodenum and trace free fluid. Hyperdense urine within the urinary bladder. Nonobstructed kidneys. Correlate with urinalysis. Otherwise stable noncontrast CT appearance of the abdomen and pelvis. 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 Cholelithiasis. Aortic Atherosclerosis" -RUQ U/S done and showed "Limited examination with overlapping bowel gas and soft tissue. Poor visualization of the pancreas, common duct. Dilated gallbladder without obvious stones. There was a stone in the gallbladder on CT." -Lipase Level Improving -C/w IVF  -Supportive Care -Checking MRI/MRCP to evaluate for choledocholithasis -Obtain KUB -Clear liquid diet and may need to kept n.p.o. if he continues to have abdominal discomfort -Pending findings of MRI/MRCP may need GI evaluation -WBC Trend: Recent Labs  Lab 11/04/23 0807 11/09/23 0309 11/10/23 0308 11/11/23 0329  WBC 8.8 9.4 14.8* 15.0*  -CXR and KUB done and showed "Likely bilateral trace pleural effusions. Nonobstructive bowel gas pattern.  Aortic Atherosclerosis'  Lethagry -Transferred to ICU per Primary -PCCM consulted and evaluating   Transient hypotension -Blood pressures noted to be as low as 81/51.  Suspect secondary to fluid losses. -IV fluids as noted above -Avoid blood pressure lowering medications at this time. -Transferred to the ICU for closer evaluation  Severe aortic stenosis -Status post TAVR postoperative day 3 -Per primary  Hypomagnesemia -Patient's Mag Level Trend: Recent Labs  Lab 11/09/23 0309 11/11/23 0325  MG 1.6* 1.5*  -Replete with IV Mag Sulfate 4 grams -Continue to Monitor and Replete as Necessary -Repeat Mag in the AM   AKI on CKD Stage 3b Metabolic Acidosis -BUN/Cr Trend: Recent Labs  Lab 11/04/23 0807 11/08/23 1436 11/09/23 0309 11/10/23 0308 11/11/23 0329 11/11/23 1336  BUN 27* 27* 24* 37* 52* 53*  CREATININE 2.02* 1.60* 1.61* 2.56* 3.84* 3.16*  -Has a metabolic acidosis with a CO2 of 16, chloride level of 103, anion gap of 13 -Avoid Nephrotoxic  Medications, Contrast Dyes, Hypotension and Dehydration to Ensure Adequate Renal Perfusion and will need to Renally Adjust Meds -Continue to Monitor and  Trend Renal Function carefully and repeat CMP in the AM   Hyperbilirubinemia -T Bili Trend: Recent Labs  Lab 11/04/23 0807 11/10/23 1236 11/11/23 0325 11/11/23 1336  BILITOT 1.1 1.8* 1.5* 1.2  -Continue to Monitor and Trend and Repeat CMP in the AM  Hypoalbuminemia -Patient's Albumin Trend: Recent Labs  Lab 11/04/23 0807 11/10/23 1236 11/11/23 0325 11/11/23 1336  ALBUMIN 4.1 3.5 2.7* 2.5*  -Continue to Monitor and Trend and repeat CMP in the AM   DVT prophylaxis: enoxaparin (LOVENOX) injection 30 mg Start: 11/11/23 1215 SCDs Start: 11/08/23 1647    Code Status: Full Code Family Communication: No family present at bedside  Disposition Plan:  Level of care: ICU Status is: Inpatient Remains inpatient appropriate because: Needs further clinical Improvement and clearance.   Consultants:  Cardiology PCCM [facility]  Procedures:  As delineated as above  Antimicrobials:  Anti-infectives (From admission, onward)    Start     Dose/Rate Route Frequency Ordered Stop   11/08/23 2100  ceFAZolin (ANCEF) IVPB 2g/100 mL premix        2 g 200 mL/hr over 30 Minutes Intravenous Every 8 hours 11/08/23 1647 11/09/23 0548   11/08/23 0400  ceFAZolin (ANCEF) IVPB 2g/100 mL premix  Status:  Discontinued        2 g 200 mL/hr over 30 Minutes Intravenous To Surgery 11/07/23 1323 11/08/23 1514       Subjective: Seen and examined at bedside was complaining some abdominal discomfort.  No nausea or vomiting had some nausea earlier.  Denies any chest pain but feels weak and subsequently throughout the afternoon became more lethargic and tired.  No other concerns or complaints this time.  Objective: Vitals:   11/11/23 1715 11/11/23 1730 11/11/23 1745 11/11/23 1800  BP: (!) 167/61 (!) 149/69 (!) 151/70 130/74  Pulse: 91 85 84 84  Resp: (!) 21 (!) 23 (!) 23 (!) 23  Temp:      TempSrc:      SpO2: 97% 95% 93% 90%  Weight:      Height:        Intake/Output Summary (Last 24 hours) at  11/11/2023 1927 Last data filed at 11/11/2023 1810 Gross per 24 hour  Intake 3717.2 ml  Output 950 ml  Net 2767.2 ml   Filed Weights   11/08/23 0906 11/09/23 0451 11/11/23 0446  Weight: 102.1 kg 98.6 kg 99.9 kg   Examination: Physical Exam:  Constitutional: WN/WD overweight Caucasian male in no acute distress appears calm Respiratory: Diminished to auscultation bilaterally, no wheezing, rales, rhonchi or crackles. Normal respiratory effort and patient is not tachypenic. No accessory muscle use.  Unlabored breathing Cardiovascular: RRR, no murmurs / rubs / gallops. S1 and S2 auscultated. No extremity edema.   Abdomen: Soft, tender to palpation, distended secondary body habitus. Bowel sounds positive.  GU: Deferred. Musculoskeletal: No clubbing / cyanosis of digits/nails. No joint deformity upper and lower extremities.  Skin: No rashes, lesions, ulcers. No induration; Warm and dry.  Neurologic: CN 2-12 grossly intact with no focal deficits. Romberg sign and cerebellar reflexes not assessed.  Psychiatric: Normal judgment and insight. Alert and oriented x 3. Normal mood and appropriate affect.   Data Reviewed: I have personally reviewed following labs and imaging studies  CBC: Recent Labs  Lab 11/08/23 1436 11/09/23 0309 11/10/23 0308 11/11/23 0329  WBC  --  9.4 14.8* 15.0*  HGB 15.6 16.3 16.4 14.7  HCT 46.0 48.8 49.0 44.7  MCV  --  95.1 94.0 95.9  PLT  --  235 243 183   Basic Metabolic Panel: Recent Labs  Lab 11/08/23 1436 11/09/23 0309 11/10/23 0308 11/11/23 0325 11/11/23 0329 11/11/23 1336  NA 141 132* 129*  --  135 132*  K 4.5 4.3 4.2  --  4.5 4.1  CL 104 101 95*  --  105 103  CO2  --  20* 21*  --  18* 16*  GLUCOSE 116* 117* 138*  --  67* 57*  BUN 27* 24* 37*  --  52* 53*  CREATININE 1.60* 1.61* 2.56*  --  3.84* 3.16*  CALCIUM  --  8.4* 8.6*  --  8.0* 7.7*  MG  --  1.6*  --  1.5*  --   --   PHOS  --   --   --  4.2  --   --    GFR: Estimated Creatinine  Clearance: 24 mL/min (A) (by C-G formula based on SCr of 3.16 mg/dL (H)). Liver Function Tests: Recent Labs  Lab 11/10/23 1236 11/11/23 0325 11/11/23 1336  AST 46* 24 19  ALT 9 6 7   ALKPHOS 41 35* 37*  BILITOT 1.8* 1.5* 1.2  PROT 6.9 6.1* 5.9*  ALBUMIN 3.5 2.7* 2.5*   Recent Labs  Lab 11/10/23 1236 11/11/23 0325  LIPASE 666* 205*  AMYLASE 495*  --    No results for input(s): "AMMONIA" in the last 168 hours. Coagulation Profile: Recent Labs  Lab 11/08/23 1010 11/10/23 1121 11/11/23 0329  INR 1.2 1.1 1.2   Cardiac Enzymes: No results for input(s): "CKTOTAL", "CKMB", "CKMBINDEX", "TROPONINI" in the last 168 hours. BNP (last 3 results) No results for input(s): "PROBNP" in the last 8760 hours. HbA1C: No results for input(s): "HGBA1C" in the last 72 hours. CBG: Recent Labs  Lab 11/11/23 0510 11/11/23 1427 11/11/23 1508  GLUCAP 91 55* 115*   Lipid Profile: No results for input(s): "CHOL", "HDL", "LDLCALC", "TRIG", "CHOLHDL", "LDLDIRECT" in the last 72 hours. Thyroid Function Tests: No results for input(s): "TSH", "T4TOTAL", "FREET4", "T3FREE", "THYROIDAB" in the last 72 hours. Anemia Panel: No results for input(s): "VITAMINB12", "FOLATE", "FERRITIN", "TIBC", "IRON", "RETICCTPCT" in the last 72 hours. Sepsis Labs: Recent Labs  Lab 11/10/23 1003 11/10/23 1236  LATICACIDVEN 1.2 1.5    Recent Results (from the past 240 hours)  Surgical pcr screen     Status: Abnormal   Collection Time: 11/04/23  8:06 AM   Specimen: Nasal Mucosa; Nasal Swab  Result Value Ref Range Status   MRSA, PCR NEGATIVE NEGATIVE Final   Staphylococcus aureus POSITIVE (A) NEGATIVE Final    Comment: (NOTE) The Xpert SA Assay (FDA approved for NASAL specimens in patients 30 years of age and older), is one component of a comprehensive surveillance program. It is not intended to diagnose infection nor to guide or monitor treatment. Performed at [facility], [address]  [place], [place] [address]     Radiology Studies: DG CHEST PORT 1 VIEW Result Date: 11/11/2023 CLINICAL DATA:  141880 SOB (shortness of breath) 141880 [id number] Abdominal pain [id number] EXAM: PORTABLE CHEST and abdomen 1 VIEW COMPARISON:  CT chest 02/25/2023 FINDINGS: The heart and mediastinal contours are within normal limits. Aortic valve replacement. Atherosclerotic plaque. No focal consolidation. Chronic coarsened interstitial markings. No definite pulmonary edema. Bilateral trace pleural effusion. No pneumothorax. Nonobstructive bowel gas pattern. Stool noted within the ascending, transverse, descending colon. No  acute osseous abnormality. IMPRESSION: 1. Likely bilateral trace pleural effusions. 2. Nonobstructive bowel gas pattern. 3.  Aortic Atherosclerosis (ICD10-I70.0). Electronically Signed   By: Tish Frederickson M.D.   On: 11/11/2023 18:14   DG Abd 1 View Result Date: 11/11/2023 CLINICAL DATA:  141880 SOB (shortness of breath) 141880 [id number] Abdominal pain [id number] EXAM: PORTABLE CHEST and abdomen 1 VIEW COMPARISON:  CT chest 02/25/2023 FINDINGS: The heart and mediastinal contours are within normal limits. Aortic valve replacement. Atherosclerotic plaque. No focal consolidation. Chronic coarsened interstitial markings. No definite pulmonary edema. Bilateral trace pleural effusion. No pneumothorax. Nonobstructive bowel gas pattern. Stool noted within the ascending, transverse, descending colon. No acute osseous abnormality. IMPRESSION: 1. Likely bilateral trace pleural effusions. 2. Nonobstructive bowel gas pattern. 3.  Aortic Atherosclerosis (ICD10-I70.0). Electronically Signed   By: Tish Frederickson M.D.   On: 11/11/2023 18:14   MR ABDOMEN MRCP WO CONTRAST Result Date: 11/11/2023 CLINICAL DATA:  Pancreatitis.  Acute severe pancreatitis. EXAM: MRI ABDOMEN WITHOUT CONTRAST  (INCLUDING MRCP) TECHNIQUE: Multiplanar multisequence MR imaging of the abdomen was performed. Heavily T2-weighted images of the biliary and  pancreatic ducts were obtained, and three-dimensional MRCP images were rendered by post processing. COMPARISON:  CT 11/10/2023 FINDINGS: Lower chest:  Lung bases are clear. Hepatobiliary: 6 mm gallstone towards the neck of the gallbladder (image 22/series 2). No gallbladder distension. No additional gallstones are evident. The common bile duct is normal caliber. No choledocholithiasis. Normal liver parenchyma. Benign cyst in the posterior RIGHT hepatic lobe. No biliary duct dilatation. Pancreas: There is mild retroperitoneal fluid stranding along the body of the pancreas extending into the anterior LEFT and RIGHT pararenal fascia. Small amount fluid extends along the LEFT pericolic gutter inferior to the spleen. No organized fluid collections. Postcontrast T1 weighted imaging is mildly degraded by respiratory motion. Pancreas is mildly edematous. No evidence of pancreatic necrosis. The pancreatic duct is normal caliber. No evidence of ductal interruption. Spleen: Normal spleen. Adrenals/urinary tract: Adrenal glands and kidneys are normal. Stomach/Bowel: Stomach and limited of the small bowel is unremarkable Vascular/Lymphatic: Abdominal aortic normal caliber. No retroperitoneal periportal lymphadenopathy. Musculoskeletal: No aggressive osseous lesion IMPRESSION: 1. Acute pancreatitis. No evidence of pancreatic necrosis. 2. No organized fluid collections. 3. Cholelithiasis without evidence of acute cholecystitis. 4. No choledocholithiasis. No biliary duct dilatation. Electronically Signed   By: Genevive Bi M.D.   On: 11/11/2023 16:02   US Abdomen Complete Result Date: 11/10/2023 CLINICAL DATA:  Pancreatitis. EXAM: ABDOMEN ULTRASOUND COMPLETE COMPARISON:  CT 11/10/2023.  Older exams as well. FINDINGS: Gallbladder: Gallbladder is dilated. Borderline wall thickening of 3 mm. No shadowing stones. Common bile duct: Diameter: Obscured by overlapping bowel gas and soft tissue. Labeled 11 mm but poorly defined and  difficult to confirm enlargement. No intrahepatic biliary ductal dilatation Liver: No focal lesion identified. Within normal limits in parenchymal echogenicity. Portal vein is patent on color Doppler imaging with normal direction of blood flow towards the liver. IVC: Obscured by overlapping bowel gas and soft tissue. Pancreas: Obscured by overlapping bowel gas and soft tissue. Spleen: Size and appearance within normal limits. Right Kidney: Length: 9.9 cm. No collecting system dilatation. Upper pole cyst identified as on CT measuring 2.8 cm. Left Kidney: Length: 10.9 cm. No collecting system dilatation. Multiple benign-appearing cysts. These measure up to 2.7 x 3.0 cm. Echogenicity within normal limits. No mass or hydronephrosis visualized. Abdominal aorta: Partially obscured by overlapping bowel gas and soft tissue. Other findings: None. IMPRESSION: Limited examination with overlapping bowel gas and soft tissue.  Poor visualization of the pancreas, common duct. Dilated gallbladder without obvious stones. There was a stone in the gallbladder on CT. With the limitation further evaluation as clinically appropriate Electronically Signed   By: Karen Kays M.D.   On: 11/10/2023 17:22   ECHOCARDIOGRAM LIMITED Result Date: 11/10/2023    ECHOCARDIOGRAM LIMITED REPORT   Patient Name:   Tony Zhang Date of Exam: 11/10/2023 Medical Rec #:  [id number]     Height:       73.0 in Accession #:    [id number]    Weight:       217.3 lb Date of Birth:  1946/01/03     BSA:          2.229 m Patient Age:    77 years      BP:           81/50 mmHg Patient Gender: M             HR:           90 bpm. Exam Location:  Inpatient Procedure: Limited Echo, Cardiac Doppler and Color Doppler Indications:    Dyspnea  History:        Patient has prior history of Echocardiogram examinations, most                 recent 11/09/2023. CAD, COPD; Aortic Valve Disease.                 Aortic Valve: 26 mm Edwards Sapien prosthetic, stented (TAVR)                  valve is present in the aortic position. Procedure Date:                 11/08/23.  Sonographer:    Darlys Gales Referring Phys: [id number] KATHRYN R THOMPSON IMPRESSIONS  1. Left ventricular ejection fraction, by estimation, is 65 to 70%. The left ventricle has normal function. The left ventricle has no regional wall motion abnormalities.  2. Right ventricular systolic function is normal. The right ventricular size is normal. Tricuspid regurgitation signal is inadequate for assessing PA pressure.  3. The aortic valve has been repaired/replaced. Aortic valve regurgitation is not visualized. There is a 26 mm Edwards Sapien prosthetic (TAVR) valve present in the aortic position. Procedure Date: 11/08/23. Aortic valve mean gradient measures 15.0 mmHg.  Aortic valve Vmax measures 2.34 m/s. Aortic valve acceleration time measures 63 msec.  4. The inferior vena cava is normal in size with greater than 50% respiratory variability, suggesting right atrial pressure of 3 mmHg. Comparison(s): Prior images reviewed side by side. The TAVR prosthetic gradients are higher, but the acceleration time, dimensionless index and estimated valve area are unchanged. This is consistent with an increase in stroke volume/cardiac output as a cause for the higher gradients. FINDINGS  Left Ventricle: Left ventricular ejection fraction, by estimation, is 65 to 70%. The left ventricle has normal function. The left ventricle has no regional wall motion abnormalities. Right Ventricle: The right ventricular size is normal. Right ventricular systolic function is normal. Tricuspid regurgitation signal is inadequate for assessing PA pressure. Pericardium: There is no evidence of pericardial effusion. Aortic Valve: The aortic valve has been repaired/replaced. Aortic valve regurgitation is not visualized. Aortic valve mean gradient measures 15.0 mmHg. Aortic valve peak gradient measures 21.9 mmHg. Aortic valve area, by VTI measures 1.94 cm. There is a  26  mm Edwards Sapien prosthetic, stented (TAVR) valve present in the aortic position.  Procedure Date: 11/08/23. Venous: The inferior vena cava is normal in size with greater than 50% respiratory variability, suggesting right atrial pressure of 3 mmHg. LEFT VENTRICLE PLAX 2D LVOT diam:     1.90 cm LV SV:         71 LV SV Index:   32 LVOT Area:     2.84 cm  AORTIC VALVE AV Area (Vmax):    1.51 cm AV Area (Vmean):   1.38 cm AV Area (VTI):     1.94 cm AV Vmax:           234.00 cm/s AV Vmean:          188.000 cm/s AV VTI:            0.369 m AV Peak Grad:      21.9 mmHg AV Mean Grad:      15.0 mmHg LVOT Vmax:         125.00 cm/s LVOT Vmean:        91.300 cm/s LVOT VTI:          0.252 m LVOT/AV VTI ratio: 0.68  SHUNTS Systemic VTI:  0.25 m Systemic Diam: 1.90 cm Rachelle Hora Croitoru MD Electronically signed by Thurmon Fair MD Signature Date/Time: 11/10/2023/4:41:07 PM    Final    CT ABDOMEN PELVIS WO CONTRAST Result Date: 11/10/2023 CLINICAL DATA:  78 year old male with acute abdominal pain. EXAM: CT ABDOMEN AND PELVIS WITHOUT CONTRAST TECHNIQUE: Multidetector CT imaging of the abdomen and pelvis was performed following the standard protocol without IV contrast. RADIATION DOSE REDUCTION: This exam was performed according to the departmental dose-optimization program which includes automated exposure control, adjustment of the mA and/or kV according to patient size and/or use of iterative reconstruction technique. COMPARISON:  CTA chest abdomen and pelvis 10/10/2023 and earlier. FINDINGS: Lower chest: Partially visible TAVR. Heart size remains normal. No pericardial effusion. Calcified aortic atherosclerosis. No pleural effusion. Lung base scarring and mild architectural distortion appears stable since 2023. Hepatobiliary: Small dependent gallstones. No primary gallbladder inflammation is evident. Negative noncontrast liver. Pancreas: Enlarged and inflamed pancreas. Peripancreatic inflammation throughout. Trace fluid in the  lesser sac. No pancreatic ductal dilatation is evident. No contrast administered to evaluate for necrosis. Secondary inflammation of the duodenum. Spleen: Mild inflammation at the splenic hilum secondary to the pancreas. Incidental splenule (normal). Adrenals/Urinary Tract: Normal adrenal glands. Stable, nonobstructed kidneys since 2023. Decompressed ureters. Hyperdense urine, but otherwise unremarkable urinary bladder. Stomach/Bowel: Highly redundant but also decompressed sigmoid colon throughout the lower abdomen and pelvis. Decompressed distal descending colon. Retained stool in redundant transverse colon. Negative right colon and decompressed terminal ileum. No dilated small bowel. Decompressed stomach. Secondary inflammation of the duodenum, also mostly decompressed. No free air or free fluid. Vascular/Lymphatic: Extensive Aortoiliac calcified atherosclerosis. Occluded infrarenal aorta, aortoiliac bifurcation, proximal pelvic arteries demonstrated on the CTA last month. Inferior epigastric arterial collaterals noted on that exam. Stable caliber of the abdominal aorta. Vascular patency is not evaluated in the absence of IV contrast. No lymphadenopathy identified. Reproductive: Stable, negative. Other: No pelvis free fluid. Musculoskeletal: No acute osseous abnormality identified. IMPRESSION: 1. Acute Pancreatitis. Secondary inflammation of the duodenum and trace free fluid. 2. Hyperdense urine within the urinary bladder. Nonobstructed kidneys. Correlate with urinalysis. 3. Otherwise stable noncontrast CT appearance of the abdomen and pelvis. 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 Cholelithiasis. Aortic Atherosclerosis (ICD10-I70.0). Electronically Signed   By: Odessa Fleming M.D.   On: 11/10/2023 11:27   Scheduled Meds:  atorvastatin  20 mg  Oral Daily   Chlorhexidine Gluconate Cloth  6 each Topical Daily   cilostazol  100 mg Oral Daily   enoxaparin (LOVENOX) injection  30 mg Subcutaneous Daily    fluticasone furoate-vilanterol  1 puff Inhalation Daily   [START ON 11/14/2023] gabapentin  100 mg Oral BID   gabapentin  300 mg Oral BID   sodium chloride flush  3 mL Intravenous Q12H   tamsulosin  0.4 mg Oral QPM   Warfarin - Pharmacist Dosing Inpatient   Does not apply q1600   Continuous Infusions:  lactated ringers 125 mL/hr at 11/11/23 1800    LOS: 3 days   Marguerita Merles, DO [facility] Available via Epic secure chat 7am-7pm After these hours, please refer to coverage provider listed on [email or web address] 11/11/2023, 7:27 PM

## 2023-11-11 NOTE — Progress Notes (Signed)
PHARMACY - ANTICOAGULATION CONSULT NOTE  Pharmacy Consult for Warfarin Indication: VTE history  Allergies  Allergen Reactions   Codeine Hives and Other (See Comments)    Takes benadryl to stop allergic reactions    Patient Measurements: Height: 6\' 1"  (185.4 cm) Weight: 99.9 kg (220 lb 3.8 oz) IBW/kg (Calculated) : 79.9  Vital Signs: Temp: 98.3 F (36.8 C) (01/24 0807) Temp Source: Oral (01/24 0807) BP: 110/58 (01/24 1113) Pulse Rate: 82 (01/24 1113)  Labs: Recent Labs    11/09/23 0309 11/10/23 0308 11/10/23 1121 11/11/23 0329  HGB 16.3 16.4  --  14.7  HCT 48.8 49.0  --  44.7  PLT 235 243  --  183  LABPROT  --   --  14.5 15.9*  INR  --   --  1.1 1.2  CREATININE 1.61* 2.56*  --  3.84*    Estimated Creatinine Clearance: 19.7 mL/min (A) (by C-G formula based on SCr of 3.84 mg/dL (H)).   Medical History: Past Medical History:  Diagnosis Date   AAA (abdominal aortic aneurysm) (HCC)    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     CAD (coronary artery disease)    COPD (chronic obstructive pulmonary disease) (HCC)    DJD (degenerative joint disease)    Dyslipidemia    Gout    Malignant hypertension    Peripheral vascular disease (HCC)    Pneumonia 08/18/2013   pt. 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 Excell Seltzer and Dr. Laneta Simmers   Severe aortic stenosis    Venous thrombosis    Recurrent    Assessment: 78 year old male s/p TAVR on warfarin prior to admission. Dose prior to admission 4 mg Sat, Sun, 2 mg other days Warfarin 2mg  started last PM INR 1.1>1.2  Begin enoxaparin for VTE px while INR < 2 Will give small boost tonight and resume PTA dosing tomorrow - allow INR to drift up - pt aware to make  outpt follow up INR check with MD in [place] after DC next week   Goal of Therapy:  INR 2-3 Monitor platelets by anticoagulation protocol: Yes   Plan:  Warfarin 5 mg po x 1 dose today Then resume PTA warfarin 4mg  Sat and Sun and 2mg  AOD Enoxaparin 30mg  every day with Crcl < 3ml/min - adjust as needed Daily INR, CBC and BMET    Leota Sauers Pharm.D. CPP, BCPS Clinical Pharmacist [phone] 11/11/2023 11:32 AM

## 2023-11-11 NOTE — Progress Notes (Signed)
   11/10/23 1921  Vitals  BP (!) 84/38  MAP (mmHg) (!) 50  BP Location Right Arm  BP Method Automatic  Patient Position (if appropriate) Lying  Pulse Rate 93  Pulse Rate Source Monitor  ECG Heart Rate 93  Resp 18  Level of Consciousness  Level of Consciousness Alert  MEWS COLOR  MEWS Score Color Green  Oxygen Therapy  SpO2 91 %  O2 Device Nasal Cannula  O2 Flow Rate (L/min) 4 L/min  Pain Assessment  Pain Scale 0-10  Pain Score 7  Pain Type Acute pain  Pain Location Abdomen  Pain Orientation Mid  Pain Descriptors / Indicators Sharp  Pain Frequency Constant  Pain Onset Gradual  MEWS Score  MEWS Temp 0  MEWS Systolic 1  MEWS Pulse 0  MEWS RR 0  MEWS LOC 0  MEWS Score 1   Rechecked Vital signs in opposite sites.

## 2023-11-11 NOTE — Progress Notes (Signed)
A.M. Labs back Glucose 67. Patient asymptomatic . Skin warm and dry. A glass of coke given to patient and ice cream.

## 2023-11-11 NOTE — Consult Note (Signed)
NAME:  Tony Zhang, MRN:  [id number], DOB:  1946-06-06, LOS: 3 ADMISSION DATE:  11/08/2023, CONSULTATION DATE:  11/11/23 REFERRING MD:  Excell Seltzer CHIEF COMPLAINT:  Abd pain, Hypotension   History of Present Illness:  Tony Zhang is a 78 y.o. male who has a PMH as below. He presented to [facility] 1/21 for TAVR. Procedure was successful without complications. On 1/23, he began to experience abdominal pain, central and radiation to left side. Pain was persistent and he had associated nausea. Had also not had BM in 3 days. Later in the day, he was noted to have hypotension after Lasix and Toprol was administered.  He had CT that demonstrated acute pancreatitis with secondary inflammation of the duodenum and trace free fluid, hyperdense urine in the bladder with non-obstructed kidneys, cholelithiasis. Chronic occlusion of the infrarenal aorta noted. Abd US demonstrated dilated GB without obvious stones.  Overnight 1/23, he had received 250cc bolus with improvement in pressures followed by another 500cc later in the shift with improvement again.  PM 1/24, he had labile pressures and an episode of decreased responsiveness though CBG noted to be in 50s after he had been made NPO following findings of pancreatitis.  He was subsequently transferred to the ICU and PCCM asked to assist in his care. Upon arrival to ICU, SBP noted to be in 1202 - 140s. He is A&O x 3. Has mild abdominal pain that is diffuse. He appears volume down.  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 ; Chronic distal aortic occlusion (HCC); DVT, lower extremity and pulmonary embolism, recurrent; Hyperlipidemia; Gout; COPD with acute exacerbation (HCC); OSA (obstructive sleep apnea); Bilateral renal artery stenosis (HCC); Microcytic anemia; Peripheral vascular disease (HCC); Anticoagulated on Coumadin; Empyema, right (HCC); Loculated pleural effusion; GIB (gastrointestinal bleeding); Enlarged  prostate; Mediastinal lymphadenopathy; Non-ST elevation (NSTEMI) myocardial infarction [facility]); Aortic stenosis, severe; CAD (coronary artery disease); HTN (hypertension); and S/P TAVR (transcatheter aortic valve replacement) on their problem list.  Significant Hospital Events: Including procedures, antibiotic start and stop dates in addition to other pertinent events   1/21 admit 1/23 hypotension after lasix and toprol 1/24 transfer to ICU  Interim History / Subjective:  SBP 120 - 140s. Has mild diffuse abdominal pain.  Objective:  Blood pressure (!) 159/66, pulse 87, temperature 98 F (36.7 C), temperature source Oral, resp. rate (!) 21, height 6\' 1"  (1.854 m), weight 99.9 kg, SpO2 99%.        Intake/Output Summary (Last 24 hours) at 11/11/2023 1601 Last data filed at 11/11/2023 1230 Gross per 24 hour  Intake 1815 ml  Output 505 ml  Net 1310 ml   Filed Weights   11/08/23 0906 11/09/23 0451 11/11/23 0446  Weight: 102.1 kg 98.6 kg 99.9 kg    Examination: General: Adult male, resting in bed, in NAD. Neuro: A&O x 3, no deficits. HEENT: [place]/AT. Sclerae anicteric. EOMI. MM dry. Cardiovascular: RRR, no M/R/G.  Lungs: Respirations even and unlabored.  CTA bilaterally, No W/R/R. Abdomen: Distended. BS hypoactive. Tenderness throughout. Musculoskeletal: No gross deformities, no edema.  Skin: Intact, warm, no rashes.   Assessment & Plan:   Severe AS - s/p TAVR 1/21. - Post op care per cards. - Continue Warfarin.  Hypotension - felt to be 2/2 hypovolemia and insults after Lasix and Toprol. Now improved with SBP 120s - 140s; though, he still appears dry. Hx AoC HFpEF, CAD s/p DES to LAD 2023, PAD. - 1L LR bolus followed by maintenance fluids. -  Hold further diuresis especially with his pancreatitis. - Hold Jardiance, Toprol-XL. - Continue Atorvastatin, Repatha, Pletal.  AoCKD Stage IIIb - exacerbated by contrast during TAVR, HoTN, hypovolemia. Slightly improved after small fluid  boluses 1/24. NAGMA. - 1L LR bolus as above. - 1amp HCO3. - Hold nephrotoxins. - Follow UOP and BMP.  Acute pancreatitis - GB stones noted on CT but none noted on [place]. - Continue NPO. - Continue fluids. - Follow up on MRCP, might need to involve GI. - Defer abx for now but follow MRCP and if any concerns then add Merrem.  Urinary retention. - Continue foley. - Continue Flomax.  Hx DVT/PE. - Continue Warfarin.  Hypoglycemia - has been NPO. - D50 as needed. - CBG checks q4hrs for now.  Monitor BP through the evening. If no recurrence of hypotension can transfer back out of ICU. Will also need to follow MRCP results and decide on abx and possible GI involvement.  Best practice (evaluated daily):  Diet/type: NPO DVT prophylaxis: Coumadin Pressure ulcer(s): pressure ulcer assessment deferred  GI prophylaxis: N/A Lines: N/A Foley:  Yes, and it is still needed Code Status:  full code Last date of multidisciplinary goals of care discussion: None yet.  Labs   CBC: Recent Labs  Lab 11/08/23 1436 11/09/23 0309 11/10/23 0308 11/11/23 0329  WBC  --  9.4 14.8* 15.0*  HGB 15.6 16.3 16.4 14.7  HCT 46.0 48.8 49.0 44.7  MCV  --  95.1 94.0 95.9  PLT  --  235 243 183    Basic Metabolic Panel: Recent Labs  Lab 11/08/23 1436 11/09/23 0309 11/10/23 0308 11/11/23 0325 11/11/23 0329 11/11/23 1336  NA 141 132* 129*  --  135 132*  K 4.5 4.3 4.2  --  4.5 4.1  CL 104 101 95*  --  105 103  CO2  --  20* 21*  --  18* 16*  GLUCOSE 116* 117* 138*  --  67* 57*  BUN 27* 24* 37*  --  52* 53*  CREATININE 1.60* 1.61* 2.56*  --  3.84* 3.16*  CALCIUM  --  8.4* 8.6*  --  8.0* 7.7*  MG  --  1.6*  --  1.5*  --   --   PHOS  --   --   --  4.2  --   --    GFR: Estimated Creatinine Clearance: 24 mL/min (A) (by C-G formula based on SCr of 3.16 mg/dL (H)). Recent Labs  Lab 11/09/23 0309 11/10/23 0308 11/10/23 1003 11/10/23 1236 11/11/23 0329  WBC 9.4 14.8*  --   --  15.0*  LATICACIDVEN   --   --  1.2 1.5  --     Liver Function Tests: Recent Labs  Lab 11/10/23 1236 11/11/23 0325 11/11/23 1336  AST 46* 24 19  ALT 9 6 7   ALKPHOS 41 35* 37*  BILITOT 1.8* 1.5* 1.2  PROT 6.9 6.1* 5.9*  ALBUMIN 3.5 2.7* 2.5*   Recent Labs  Lab 11/10/23 1236 11/11/23 0325  LIPASE 666* 205*  AMYLASE 495*  --    No results for input(s): "AMMONIA" in the last 168 hours.  ABG    Component Value Date/Time   PHART 7.405 09/13/2020 1301   PCO2ART 37.0 09/13/2020 1301   PO2ART 56 (L) 09/13/2020 1301   HCO3 24.1 09/28/2023 1118   HCO3 23.5 09/28/2023 1118   TCO2 24 11/08/2023 1436   ACIDBASEDEF 1.0 09/28/2023 1118   ACIDBASEDEF 2.0 09/28/2023 1118   O2SAT 65 09/28/2023 1118  O2SAT 64 09/28/2023 1118     Coagulation Profile: Recent Labs  Lab 11/08/23 1010 11/10/23 1121 11/11/23 0329  INR 1.2 1.1 1.2    Cardiac Enzymes: No results for input(s): "CKTOTAL", "CKMB", "CKMBINDEX", "TROPONINI" in the last 168 hours.  HbA1C: Hgb A1c MFr Bld  Date/Time Value Ref Range Status  02/26/2023 03:18 PM 5.5 4.8 - 5.6 % Final    Comment:    (NOTE) Pre diabetes:          5.7%-6.4%  Diabetes:              >6.4%  Glycemic control for   <7.0% adults with diabetes   06/03/2022 02:57 AM 5.1 4.8 - 5.6 % Final    Comment:    (NOTE) Pre diabetes:          5.7%-6.4%  Diabetes:              >6.4%  Glycemic control for   <7.0% adults with diabetes     CBG: Recent Labs  Lab 11/11/23 0510 11/11/23 1427 11/11/23 1508  GLUCAP 91 55* 115*    Review of Systems:   All negative; except for those that are bolded, which indicate positives.  Constitutional: weight loss, weight gain, night sweats, fevers, chills, fatigue, weakness.  HEENT: headaches, sore throat, sneezing, nasal congestion, post nasal drip, difficulty swallowing, tooth/dental problems, visual complaints, visual changes, ear aches. Neuro: difficulty with speech, weakness, numbness, ataxia. CV:  chest pain, orthopnea,  PND, swelling in lower extremities, dizziness, palpitations, syncope.  Resp: cough, hemoptysis, dyspnea, wheezing. GI: heartburn, indigestion, abdominal pain, nausea, vomiting, diarrhea, constipation, change in bowel habits, loss of appetite, hematemesis, melena, hematochezia.  GU: dysuria, change in color of urine, urgency or frequency, flank pain, hematuria. MSK: joint pain or swelling, decreased range of motion. Psych: change in mood or affect, depression, anxiety, suicidal ideations, homicidal ideations. Skin: rash, itching, bruising.   Past Medical History:  He,  has a past medical history of AAA (abdominal aortic aneurysm) (HCC), Blood dyscrasia, CAD (coronary artery disease), COPD (chronic obstructive pulmonary disease) (HCC), DJD (degenerative joint disease), Dyslipidemia, Gout, Malignant hypertension, Peripheral vascular disease (HCC), Pneumonia (08/18/2013), S/P TAVR (transcatheter aortic valve replacement) (11/08/2023), Severe aortic stenosis, and Venous thrombosis.   Surgical History:   Past Surgical History:  Procedure Laterality Date   APPENDECTOMY     BACK SURGERY     COLONOSCOPY N/A 04/13/2015   Procedure: COLONOSCOPY;  Surgeon: Carman Ching, MD;  Location: [facility] ENDOSCOPY;  Service: Endoscopy;  Laterality: N/A;   CORONARY STENT INTERVENTION N/A 06/04/2022   Procedure: CORONARY STENT INTERVENTION;  Surgeon: Corky Crafts, MD;  Location: [facility] INVASIVE CV LAB;  Service: Cardiovascular;  Laterality: N/A;   EMPYEMA DRAINAGE N/A 11/25/2014   Procedure: EMPYEMA DRAINAGE;  Surgeon: Delight Ovens, MD;  Location: [facility] OR;  Service: Thoracic;  Laterality: N/A;   ESOPHAGOGASTRODUODENOSCOPY N/A 04/10/2015   Procedure: ESOPHAGOGASTRODUODENOSCOPY (EGD);  Surgeon: Charlott Rakes, MD;  Location: [facility] ENDOSCOPY;  Service: Endoscopy;  Laterality: N/A;   EYE SURGERY     both eyes, cataracts removed, denies lens implants   FLEXIBLE SIGMOIDOSCOPY N/A 04/12/2015   Procedure: FLEXIBLE  SIGMOIDOSCOPY;  Surgeon: Carman Ching, MD;  Location: [facility] ENDOSCOPY;  Service: Endoscopy;  Laterality: N/A;   HERNIA REPAIR     LEFT HEART CATH AND CORONARY ANGIOGRAPHY N/A 06/04/2022   Procedure: LEFT HEART CATH AND CORONARY ANGIOGRAPHY;  Surgeon: Corky Crafts, MD;  Location: [facility] INVASIVE CV LAB;  Service: Cardiovascular;  Laterality: N/A;  RIGHT HEART CATH AND CORONARY ANGIOGRAPHY N/A 09/28/2023   Procedure: RIGHT HEART CATH AND CORONARY ANGIOGRAPHY;  Surgeon: Tonny Bollman, MD;  Location: [facility] INVASIVE CV LAB;  Service: Cardiovascular;  Laterality: N/A;   SHOULDER SURGERY Right    TRANSCATHETER AORTIC VALVE REPLACEMENT,SUBCLAVIAN Left 11/08/2023   Procedure: Transcatheter Aortic Valve Replacement-Subclavian;  Surgeon: Tonny Bollman, MD;  Location: [facility] INVASIVE CV LAB;  Service: Cardiovascular;  Laterality: Left;   TRANSESOPHAGEAL ECHOCARDIOGRAM (CATH LAB) N/A 11/08/2023   Procedure: TRANSESOPHAGEAL ECHOCARDIOGRAM;  Surgeon: Tonny Bollman, MD;  Location: [facility] INVASIVE CV LAB;  Service: Cardiovascular;  Laterality: N/A;   VIDEO ASSISTED THORACOSCOPY Right 11/25/2014   Procedure: VIDEO ASSISTED THORACOSCOPY;  Surgeon: Delight Ovens, MD;  Location: [facility] OR;  Service: Thoracic;  Laterality: Right;   VIDEO BRONCHOSCOPY N/A 11/25/2014   Procedure: VIDEO BRONCHOSCOPY;  Surgeon: Delight Ovens, MD;  Location: [facility] OR;  Service: Thoracic;  Laterality: N/A;     Social History:   reports that he quit smoking about 31 years ago. His smoking use included cigarettes. He has never used smokeless tobacco. He reports that he does not drink alcohol and does not use drugs.   Family History:  His family history includes CAD in his sister; CAD (age of onset: 82) in his father; Liver cancer in his brother.   Allergies Allergies  Allergen Reactions   Codeine Hives and Other (See Comments)    Takes benadryl to stop allergic reactions     Home Medications  Prior to Admission medications   Medication  Sig Start Date End Date Taking? Authorizing Provider  acetaminophen (TYLENOL) 500 MG tablet Take 500 mg by mouth in the morning.   Yes [provider]  albuterol (VENTOLIN HFA) 108 (90 Base) MCG/ACT inhaler Inhale 2 puffs into the lungs See admin instructions. Inhale 2 puffs by mouth 2-3 times a day   Yes [provider]  allopurinol (ZYLOPRIM) 300 MG tablet Take 300 mg by mouth at bedtime.   Yes [provider]  atorvastatin (LIPITOR) 20 MG tablet Take 1 tablet (20 mg total) by mouth daily. 10/05/23  Yes Croitoru, Mihai, MD  budesonide-formoterol (SYMBICORT) 160-4.5 MCG/ACT inhaler Inhale 2 puffs into the lungs in the morning and at bedtime. Patient taking differently: Inhale 2 puffs into the lungs daily. 02/28/23  Yes Almon Hercules, MD  Choline Fenofibrate (FENOFIBRIC ACID) 135 MG CPDR TAKE 1 TABLET BY MOUTH DAILY 09/26/23  Yes Tonny Bollman, MD  cilostazol (PLETAL) 100 MG tablet Take 100 mg by mouth daily.   Yes [provider]  cyanocobalamin 1000 MCG tablet Take 1 tablet (1,000 mcg total) by mouth daily. 03/03/23  Yes Almon Hercules, MD  empagliflozin (JARDIANCE) 10 MG TABS tablet Take 1 tablet (10 mg total) by mouth daily. 08/25/23  Yes Tonny Bollman, MD  Evolocumab (REPATHA SURECLICK) 140 MG/ML SOAJ Inject 140 mg into the skin every 14 (fourteen) days. 12/08/22  Yes Croitoru, Mihai, MD  furosemide (LASIX) 20 MG tablet Take 1 tablet (20 mg total) by mouth daily. 08/25/23  Yes Tonny Bollman, MD  gabapentin (NEURONTIN) 100 MG capsule Take 100 mg by mouth 2 (two) times daily. 10/31/23  Yes [provider]  gabapentin (NEURONTIN) 300 MG capsule Take 1 capsule (300 mg total) by mouth 2 (two) times daily. 07/11/23  Yes Jodelle Gross, NP  metoprolol succinate (TOPROL-XL) 100 MG 24 hr tablet Take 1 tablet (100 mg total) by mouth daily. 08/25/23  Yes Tonny Bollman, MD  nitroGLYCERIN (NITROSTAT) 0.3 MG  SL tablet Place under tongue. May take every 5 minutes  up to 3 doses for chest pain. If still having pain after 3 doses, call 911. 08/25/23  Yes Tonny Bollman, MD  ondansetron (ZOFRAN) 8 MG tablet Take 8 mg by mouth 2 (two) times daily as needed for vomiting or nausea. 05/25/22  Yes [provider]  pyridOXINE (B-6) 50 MG tablet Take 1 tablet (50 mg total) by mouth daily. 02/28/23  Yes Almon Hercules, MD  tamsulosin (FLOMAX) 0.4 MG CAPS capsule Take 0.4 mg by mouth every evening.   Yes [provider]  warfarin (COUMADIN) 2 MG tablet Take 2-4 mg by mouth See admin instructions. Take 2 tablets (4mg ) by mouth Saturday and Sunday, then take 1 tablet (2mg ) all other days   Yes [provider]  clopidogrel (PLAVIX) 75 MG tablet Take 75 mg by mouth daily. Patient not taking: Reported on 11/04/2023 10/28/23   [provider]  sodium zirconium cyclosilicate (LOKELMA) 10 g PACK packet Take 10 g by mouth 3 (three) times daily. Take 1 packet three times a Day for 48 Hours Patient not taking: Reported on 11/04/2023 09/23/23   Tonny Bollman, MD      Rutherford Guys, PA - [facility] For pager details, please see AMION or use Epic chat  After 1900, please call [facility] for cross coverage needs 11/11/2023, 4:01 PM

## 2023-11-11 NOTE — Hospital Course (Addendum)
Tony Zhang is a 78 y.o. male with past medical history of hypertension, dyslipidemia, CAD, severe aortic stenosis, PVD, history of recurrent venous thrombosis, presented for transcatheter aortic valve replacement.  Following the procedure performed on 1/21 patient reported developing severe abdominal pain in the upper region, central and radiating to the left side, two days after surgery. The pain is persistent and does not move. Accompanying the pain is a feeling of nausea, for which medication was administered. However, the medication seems to induce excessive sleepiness. The patient denies any history of alcohol use.   There is no prior history of pancreatitis, a condition that the recent CT scan suggests. The patient also reports a decrease in appetite, consuming minimal food. Pain management is currently through Tylenol, as other pain medications have previously caused stomach upset. The patient's last bowel movement was least 2 days ago.  Labs today note creatinine 2.56 with BUN 37.  Creatinine was noted to be 1.61 with BUN 27 yesterday.  Records note patient had been given Lasix 40 mg IV and was noted to be transiently hypotensive.   With his progressive decline throughout the day today the primary team transferred to the ICU and consulted critical care.  He will be continued IV fluid hydration and will continue supportive care.  TRH will resume care once patient is out of the ICU and off of the critical care service for now.  Assessment and Plan:  Acute Pancreatitis -CT Abd/Pelvis done and showed " Acute Pancreatitis. Secondary inflammation of the duodenum and trace free fluid. Hyperdense urine within the urinary bladder. Nonobstructed kidneys. Correlate with urinalysis. Otherwise stable noncontrast CT appearance of the abdomen and pelvis. 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 Cholelithiasis. Aortic Atherosclerosis" -RUQ U/S done and showed "Limited examination with  overlapping bowel gas and soft tissue. Poor visualization of the pancreas, common duct. Dilated gallbladder without obvious stones. There was a stone in the gallbladder on CT." -Lipase Level Improving -C/w IVF  -Supportive Care -Checking MRI/MRCP to evaluate for choledocholithasis -Obtain KUB -Clear liquid diet and may need to kept n.p.o. if he continues to have abdominal discomfort -Pending findings of MRI/MRCP may need GI evaluation -WBC Trend: Recent Labs  Lab 11/04/23 0807 11/09/23 0309 11/10/23 0308 11/11/23 0329 11/12/23 0320  WBC 8.8 9.4 14.8* 15.0* 10.7*  -CXR and KUB done and showed "Likely bilateral trace pleural effusions. Nonobstructive bowel gas pattern.  Aortic Atherosclerosis'  Lethagry -Transferred to ICU per Primary -PCCM consulted and evaluating   Transient hypotension -Blood pressures noted to be as low as 81/51.  Suspect secondary to fluid losses. -IV fluids as noted above -Avoid blood pressure lowering medications at this time. -Transferred to the ICU for closer evaluation  Severe aortic stenosis -Status post TAVR postoperative day 3 -Per primary  Hypomagnesemia -Patient's Mag Level Trend: Recent Labs  Lab 11/09/23 0309 11/11/23 0325 11/12/23 0320  MG 1.6* 1.5* 1.4*  -Replete with IV Mag Sulfate 4 grams -Continue to Monitor and Replete as Necessary -Repeat Mag in the AM   AKI on CKD Stage 3b Metabolic Acidosis -BUN/Cr Trend: Recent Labs  Lab 11/04/23 0807 11/08/23 1436 11/09/23 0309 11/10/23 0308 11/11/23 0329 11/11/23 1336 11/12/23 0320  BUN 27* 27* 24* 37* 52* 53* 41*  CREATININE 2.02* 1.60* 1.61* 2.56* 3.84* 3.16* 2.17*  -Has a metabolic acidosis with a CO2 of 16, chloride level of 103, anion gap of 13 -Avoid Nephrotoxic Medications, Contrast Dyes, Hypotension and Dehydration to Ensure Adequate Renal Perfusion and will need  to Renally Adjust Meds -Continue to Monitor and Trend Renal Function carefully and repeat CMP in the AM    Hyperbilirubinemia -T Bili Trend: Recent Labs  Lab 11/04/23 0807 11/10/23 1236 11/11/23 0325 11/11/23 1336 11/12/23 0320  BILITOT 1.1 1.8* 1.5* 1.2 1.1  -Continue to Monitor and Trend and Repeat CMP in the AM  Hypoalbuminemia -Patient's Albumin Trend: Recent Labs  Lab 11/04/23 0807 11/10/23 1236 11/11/23 0325 11/11/23 1336 11/12/23 0320  ALBUMIN 4.1 3.5 2.7* 2.5* 2.3*  -Continue to Monitor and Trend and repeat CMP in the AM

## 2023-11-11 NOTE — Progress Notes (Addendum)
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 No appetite. Feels very tired. No shortness of breath.  Inpatient Medications    Scheduled Meds:  allopurinol  300 mg Oral QHS   atorvastatin  20 mg Oral Daily   cilostazol  100 mg Oral Daily   fluticasone furoate-vilanterol  1 puff Inhalation Daily   gabapentin  100 mg Oral BID   gabapentin  300 mg Oral BID   sodium chloride flush  3 mL Intravenous Q12H   tamsulosin  0.4 mg Oral QPM   Warfarin - Pharmacist Dosing Inpatient   Does not apply q1600   Continuous Infusions:  sodium chloride 100 mL/hr at 11/11/23 0410   sodium chloride     PRN Meds: sodium chloride, acetaminophen, hydrALAZINE, ondansetron (ZOFRAN) IV, sodium chloride flush   Vital Signs    Vitals:   11/11/23 0600 11/11/23 0630 11/11/23 0644 11/11/23 0807  BP: (!) 92/42 (!) 85/39 (!) 102/46 (!) 104/58  Pulse: 81 80 84 86  Resp: 19 (!) 22 17 20   Temp:  98.3 F (36.8 C) 98.8 F (37.1 C) 98.3 F (36.8 C)  TempSrc:  Oral Oral Oral  SpO2: 96% 98% 96% 95%  Weight:      Height:        Intake/Output  Summary (Last 24 hours) at 11/11/2023 0841 Last data filed at 11/11/2023 0654 Gross per 24 hour  Intake 2423.28 ml  Output 305 ml  Net 2118.28 ml   Filed Weights   11/08/23 0906 11/09/23 0451 11/11/23 0446  Weight: 102.1 kg 98.6 kg 99.9 kg    Physical Exam    GEN: Well nourished, well developed, in no acute distress.  HEENT: Grossly normal.  Neck: Supple, no JVD Cardiac: RRR, no murmurs, rubs, or gallops. No clubbing, cyanosis, edema.   Respiratory:  Respirations regular and unlabored, clear to auscultation bilaterally. GI: tender to palpation MS: no deformity or atrophy. Skin: warm and dry, no rash. Subclavian, radial, groin sites are stable.  Neuro:  Strength and sensation are intact. Psych: AAOx3.  Normal affect.  Labs    CBC Recent Labs    11/10/23 0308 11/11/23 0329  WBC 14.8* 15.0*  HGB 16.4 14.7  HCT 49.0 44.7  MCV 94.0 95.9  PLT 243 183   Basic Metabolic Panel Recent Labs    86/57/84 0309 11/10/23 0308 11/11/23 0329  NA 132* 129* 135  K 4.3 4.2 4.5  CL 101 95* 105  CO2 20* 21* 18*  GLUCOSE 117* 138* 67*  BUN 24* 37* 52*  CREATININE 1.61* 2.56* 3.84*  CALCIUM 8.4* 8.6* 8.0*  MG 1.6*  --   --  Telemetry    Sinus with rates 80 Personally Reviewed  ECG    Sinus with inferior/anteroseptal q waves, HR 75- Personally Reviewed  Cardiac Studies   TAVR OPERATIVE NOTE     Date of Procedure:                11/08/2023   Preoperative Diagnosis:      Severe Aortic Stenosis    Postoperative Diagnosis:    Same    Procedure:        Transcatheter Aortic Valve Replacement - Open left subclavian Approach             Edwards Sapien 3 Ultra Resilia THV (size 26 mm, serial # [id number])              Co-Surgeons:                        Evelene Croon, MD and Tonny Bollman, MD   Anesthesiologist:                  Corky Sox, MD   Echocardiographer:              Thurmon Fair, MD   Pre-operative Echo Findings: Severe aortic stenosis Normal left  ventricular systolic function   Post-operative Echo Findings: No paravalvular leak Normal left ventricular systolic function _____________     Echo 11/09/23: IMPRESSIONS   1. Left ventricular ejection fraction, by estimation, is 65 to 70%. The  left ventricle has normal function. The left ventricle has no regional  wall motion abnormalities. There is mild left ventricular hypertrophy.  Left ventricular diastolic parameters  are indeterminate.   2. Right ventricular systolic function is normal. The right ventricular  size is normal. Tricuspid regurgitation signal is inadequate for assessing  PA pressure.   3. The mitral valve is normal in structure. Trivial mitral valve  regurgitation.   4. The aortic valve has been repaired/replaced. Aortic valve  regurgitation is not visualized. There is a 26 mm Edwards Sapien  prosthetic, stented (TAVR) valve present in the aortic      Procedure Date: 11/08/23. Echo findings are consistent with normal  structure and function of the aortic valve prosthesis. Vmax 1.6 m/s, MG  , EOA 2.1 cm^2, DI 0.74   Patient Profile     Tony Zhang is a 78 y.o. 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   Assessment & Plan    Severe AS: s/p successful TAVR with a 26 mm Edwards Sapien 3 Ultra Resilia THV via the TF approach on 11/09/23. Post operative echo showed EF 65%, normally functioning TAVR with a mean gradient of 5 mmHg and no PVL. Groin/subclavian/radial sites are stable. ECG with sinus and no high grade heart block. Resumed on home Coumadin. Will add heparin for DVT prophylaxis   Symptomatic hypotension: pt has had episodic hypotension that responds to IV fluid boluses. Stat limited echo yesterday with normal LV/RV/TAVR function and no effusion. Continue IVF's and increase to 100 cc an  hour.  Acute on chronic HFpEF: as evidenced by an elevated LVEDP ~28 mm hg at the time of TAVR. Treated with one dose of IV lasix 40mg /KDur on 1/22. Lasix, Jardiance & Toprol-XL now held given AKI and hypotension. Thankfully, he is breathing okay right now. Watch volume status carefully with  IV hydration.  AKI with baseline CKD stage IIIb: baseline creat 1.6-2.37. Creat bumped from 1.61--> 2.56--> 3.8. Pt with oliguria. Likely multifactorial with contrast exposure during TAVR, hypotension and poor PO intake. Increase fluids to 100cc/hr. Consult pharmacy to renally dose gabapentin. Will need to involve nephrology if no improvement.   Acute pancreatitis: pt has abdominal pain and CT abdomen showed acute pancreatitis. CRP/lipase/amylase elevated. [facility] hospitalists on board to help with management. Make NPO, increase fluids to 100cc/hour and recheck lipase to follow trend.  Acute urinary retention: oliguria and retention. No s/p foley. Continue IV hydration.   Hyponatremia: resolved.  CAD: s/p DES to LAD 2023. Pre op [facility] 09/28/23 showed severe calcified stenosis of the RCA unchanged from prior in 2023, patent mid LAD stent with moderate nonobstructive disease proximally, patent left circumflex, and patent subclavian with mild stenosis. Plan for medical management of CAD. Continue on atorvastatin 40 mg daily, Repatha 140mg  q 2weeks. No aspirin given chronic OAC.    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 Continue medical management with Pletal 100mg  daily, atorvastatin 40 mg daily, Repatha 140mg  q 2weeks. No aspirin given chronic OAC.    History of DVT/PE: resumed on home Coumadin. Will add heparin for DVT prophylaxis    Signed, Cline Crock, PA-C  11/11/2023, 8:41 AM  Pager [phone]  Patient seen, examined. Available data reviewed. Agree with  findings, assessment, and plan as outlined by Carlean Jews, PA-C.  The patient is seen this morning.  He is complaining of fatigue and abdominal discomfort.  He denies shortness of breath or chest pain.  We note that he did require oxygen per nasal cannula up to 3 L, but he denies subjective feelings of shortness of breath this morning.  On my exam today, he is alert, oriented, in no distress.  Lung fields are clear, heart is regular rate and rhythm with no murmur gallop, left subclavian incision is intact with no erythema or drainage, abdomen is soft with diffuse tenderness most notable in the epigastrium.  There is no rebound or guarding.  Bowel sounds are present.  Lower extremities have no edema.  Unfortunately the patient has developed acute kidney injury and pancreatitis following uncomplicated subclavian TAVR.  Appreciate hospitalist team evaluation and pending GI consultation with concern of gallstone pancreatitis.  Foley catheter is placed and he is given IV hydration 100 cc of saline per hour.  He will have to be watched carefully for development of heart failure but he needs to receive fluids in setting of pancreatitis.  I think he has acute kidney injury related to a combination of contrast nephropathy and ATN from hypotension with underlying pancreatitis.  If his creatinine trends significantly worse, we will need to call for nephrology consultation.  Otherwise, as outlined above.  Tonny Bollman, M.D. 11/11/2023 1:26 PM

## 2023-11-11 NOTE — Progress Notes (Signed)
Inserted Foley 56 F without difficulty. Got 180 ml Cl. Amber urine. Patient tol. Well. Sent urine down for U.A. that was order yesterday. Stephane Rn aware.

## 2023-11-11 NOTE — Progress Notes (Signed)
Hypoglycemic Event  CBG: 51  Treatment: 8 oz juice/soda  Symptoms: Shaky  Follow-up CBG: Time: 2053 & 2140 CBG Result: 57 --> 100  Possible Reasons for Event: Inadequate meal intake and Other: pancreatitis   Comments/MD notified:Elink @2025     Jerold Coombe

## 2023-11-11 NOTE — Progress Notes (Signed)
Check CBG since his sugar was low on A.M. labs. It was 91 after eating a snack

## 2023-11-11 NOTE — Progress Notes (Signed)
  [facility]   MULTIDISCIPLINARY HEART VALVE TEAM  Called by Shanda Bumps, RN, about increasing lethargy and hypotension. Will transfer to 2H and PCCM has been consulted. MRI abdomen completed but pending formal read.   Cline Crock PA-C  [facility]

## 2023-11-11 NOTE — Significant Event (Signed)
Asked to see patient earlier in the evening due to hypotension with BP in the 80s systolic  . Patient feels fatigued, c/o abdominal discomfort c/w daytime symptoms No chest discomfort, no lightheadedness, appetite has been poor, not thirsty Limited urine output.  Gave an extra 250cc bolus of NS, and BP responded nicely to systolics >100 Increased maintenance fluids to 100/hr Asked RN to insert foley for closer urine output monitoring   Called again to see patient at 3am because again BP dropped, patient is asleep but arousable to voice, mental status intact.  No major complaint, just overall fatigue and abdominal pain 4/10  On exam, abdomen slightly tender to palpation, but no rebound Arms and legs warm and perfused No JVD HR 80s Of note, echo earlier today showed well preserved EF with recent TAVR with mean gradient of 15 across the valve  Will try another bolus of 500cc of NS If that does not get the MAPs to >50, will likely need to start a vasopressor.  Yu-Ping Regino Schultze

## 2023-11-12 ENCOUNTER — Inpatient Hospital Stay (HOSPITAL_COMMUNITY): Payer: Medicare Other

## 2023-11-12 DIAGNOSIS — N179 Acute kidney failure, unspecified: Secondary | ICD-10-CM

## 2023-11-12 DIAGNOSIS — K859 Acute pancreatitis without necrosis or infection, unspecified: Secondary | ICD-10-CM

## 2023-11-12 DIAGNOSIS — E861 Hypovolemia: Secondary | ICD-10-CM | POA: Diagnosis not present

## 2023-11-12 DIAGNOSIS — I350 Nonrheumatic aortic (valve) stenosis: Secondary | ICD-10-CM | POA: Diagnosis not present

## 2023-11-12 DIAGNOSIS — I959 Hypotension, unspecified: Secondary | ICD-10-CM | POA: Diagnosis not present

## 2023-11-12 DIAGNOSIS — I2510 Atherosclerotic heart disease of native coronary artery without angina pectoris: Secondary | ICD-10-CM | POA: Diagnosis not present

## 2023-11-12 DIAGNOSIS — Z952 Presence of prosthetic heart valve: Secondary | ICD-10-CM | POA: Diagnosis not present

## 2023-11-12 DIAGNOSIS — K851 Biliary acute pancreatitis without necrosis or infection: Secondary | ICD-10-CM

## 2023-11-12 LAB — GLUCOSE, CAPILLARY
Glucose-Capillary: 127 mg/dL — ABNORMAL HIGH (ref 70–99)
Glucose-Capillary: 128 mg/dL — ABNORMAL HIGH (ref 70–99)
Glucose-Capillary: 136 mg/dL — ABNORMAL HIGH (ref 70–99)
Glucose-Capillary: 137 mg/dL — ABNORMAL HIGH (ref 70–99)
Glucose-Capillary: 69 mg/dL — ABNORMAL LOW (ref 70–99)
Glucose-Capillary: 79 mg/dL (ref 70–99)
Glucose-Capillary: 81 mg/dL (ref 70–99)
Glucose-Capillary: 90 mg/dL (ref 70–99)

## 2023-11-12 LAB — BASIC METABOLIC PANEL
Anion gap: 8 (ref 5–15)
BUN: 41 mg/dL — ABNORMAL HIGH (ref 8–23)
CO2: 20 mmol/L — ABNORMAL LOW (ref 22–32)
Calcium: 7.8 mg/dL — ABNORMAL LOW (ref 8.9–10.3)
Chloride: 106 mmol/L (ref 98–111)
Creatinine, Ser: 2.17 mg/dL — ABNORMAL HIGH (ref 0.61–1.24)
GFR, Estimated: 30 mL/min — ABNORMAL LOW (ref >60)
Glucose, Bld: 70 mg/dL (ref 70–99)
Potassium: 4 mmol/L (ref 3.5–5.1)
Sodium: 134 mmol/L — ABNORMAL LOW (ref 135–145)

## 2023-11-12 LAB — CBC
HCT: 39.2 % (ref 39.0–52.0)
Hemoglobin: 12.9 g/dL — ABNORMAL LOW (ref 13.0–17.0)
MCH: 31.4 pg (ref 26.0–34.0)
MCHC: 32.9 g/dL (ref 30.0–36.0)
MCV: 95.4 fL (ref 80.0–100.0)
Platelets: 164 10*3/uL (ref 150–400)
RBC: 4.11 MIL/uL — ABNORMAL LOW (ref 4.22–5.81)
RDW: 15.6 % — ABNORMAL HIGH (ref 11.5–15.5)
WBC: 10.7 10*3/uL — ABNORMAL HIGH (ref 4.0–10.5)
nRBC: 0 % (ref 0.0–0.2)

## 2023-11-12 LAB — LIPASE, BLOOD: Lipase: 72 U/L — ABNORMAL HIGH (ref 11–51)

## 2023-11-12 LAB — HEPATIC FUNCTION PANEL
ALT: 10 U/L (ref 0–44)
AST: 21 U/L (ref 15–41)
Albumin: 2.3 g/dL — ABNORMAL LOW (ref 3.5–5.0)
Alkaline Phosphatase: 40 U/L (ref 38–126)
Bilirubin, Direct: 0.4 mg/dL — ABNORMAL HIGH (ref 0.0–0.2)
Indirect Bilirubin: 0.7 mg/dL (ref 0.3–0.9)
Total Bilirubin: 1.1 mg/dL (ref 0.0–1.2)
Total Protein: 5.5 g/dL — ABNORMAL LOW (ref 6.5–8.1)

## 2023-11-12 LAB — PROTIME-INR
INR: 1.4 — ABNORMAL HIGH (ref 0.8–1.2)
Prothrombin Time: 17.5 s — ABNORMAL HIGH (ref 11.4–15.2)

## 2023-11-12 LAB — BRAIN NATRIURETIC PEPTIDE: B Natriuretic Peptide: 255 pg/mL — ABNORMAL HIGH (ref 0.0–100.0)

## 2023-11-12 LAB — PHOSPHORUS: Phosphorus: 3 mg/dL (ref 2.5–4.6)

## 2023-11-12 LAB — MAGNESIUM: Magnesium: 1.4 mg/dL — ABNORMAL LOW (ref 1.7–2.4)

## 2023-11-12 MED ORDER — OXYCODONE HCL 5 MG PO TABS
5.0000 mg | ORAL_TABLET | ORAL | Status: DC | PRN
  Administered 2023-11-12 – 2023-11-16 (×16): 5 mg via ORAL
  Filled 2023-11-12 (×16): qty 1

## 2023-11-12 MED ORDER — MAGNESIUM SULFATE 4 GM/100ML IV SOLN
4.0000 g | Freq: Once | INTRAVENOUS | Status: AC
  Administered 2023-11-12: 4 g via INTRAVENOUS
  Filled 2023-11-12: qty 100

## 2023-11-12 MED ORDER — ENOXAPARIN SODIUM 40 MG/0.4ML IJ SOSY
40.0000 mg | PREFILLED_SYRINGE | Freq: Every day | INTRAMUSCULAR | Status: DC
  Administered 2023-11-13: 40 mg via SUBCUTANEOUS
  Filled 2023-11-12: qty 0.4

## 2023-11-12 MED ORDER — WARFARIN SODIUM 5 MG PO TABS
5.0000 mg | ORAL_TABLET | Freq: Once | ORAL | Status: AC
  Administered 2023-11-12: 5 mg via ORAL
  Filled 2023-11-12: qty 1

## 2023-11-12 MED ORDER — TAMSULOSIN HCL 0.4 MG PO CAPS
0.8000 mg | ORAL_CAPSULE | Freq: Every evening | ORAL | Status: DC
  Administered 2023-11-12 – 2023-11-16 (×5): 0.8 mg via ORAL
  Filled 2023-11-12 (×5): qty 2

## 2023-11-12 MED ORDER — HYDROMORPHONE HCL 1 MG/ML IJ SOLN
0.5000 mg | INTRAMUSCULAR | Status: DC | PRN
  Administered 2023-11-13 – 2023-11-18 (×6): 0.5 mg via INTRAVENOUS
  Filled 2023-11-12 (×6): qty 0.5

## 2023-11-12 NOTE — Progress Notes (Signed)
NAME:  Tony Zhang, MRN:  [id number], DOB:  [birth year], LOS: 4 ADMISSION DATE:  11/08/2023, CONSULTATION DATE:  11/11/23 REFERRING MD:  Excell Seltzer CHIEF COMPLAINT:  Abd pain, Hypotension   History of Present Illness:  Tony Zhang is a 77 y.o. male who has a PMH as below. He presented to [facility] 1/21 for TAVR. Procedure was successful without complications. On 1/23, he began to experience abdominal pain, central and radiation to left side. Pain was persistent and he had associated nausea. Had also not had BM in 3 days. Later in the day, he was noted to have hypotension after Lasix and Toprol was administered.  He had CT that demonstrated acute pancreatitis with secondary inflammation of the duodenum and trace free fluid, hyperdense urine in the bladder with non-obstructed kidneys, cholelithiasis. Chronic occlusion of the infrarenal aorta noted. Abd US demonstrated dilated GB without obvious stones.  Overnight 1/23, he had received 250cc bolus with improvement in pressures followed by another 500cc later in the shift with improvement again.  PM 1/24, he had labile pressures and an episode of decreased responsiveness though CBG noted to be in 50s after he had been made NPO following findings of pancreatitis.  He was subsequently transferred to the ICU and PCCM asked to assist in his care. Upon arrival to ICU, SBP noted to be in 1202 - 140s. He is A&O x 3. Has mild abdominal pain that is diffuse. He appears volume down.  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 ; Chronic distal aortic occlusion (HCC); DVT, lower extremity and pulmonary embolism, recurrent; Hyperlipidemia; Gout; COPD with acute exacerbation (HCC); OSA (obstructive sleep apnea); Bilateral renal artery stenosis (HCC); Microcytic anemia; Peripheral vascular disease (HCC); Anticoagulated on Coumadin; Empyema, right (HCC); Loculated pleural effusion; GIB (gastrointestinal bleeding); Enlarged  prostate; Mediastinal lymphadenopathy; Non-ST elevation (NSTEMI) myocardial infarction [facility]); Aortic stenosis, severe; CAD (coronary artery disease); HTN (hypertension); and S/P TAVR (transcatheter aortic valve replacement) on their problem list.  Significant Hospital Events: Including procedures, antibiotic start and stop dates in addition to other pertinent events   1/21 admit 1/23 hypotension after lasix and toprol 1/24 transfer to ICU; never needed pressors, responded to IVF boluses.  Interim History / Subjective:  Tmax 99.7, hypertensive overnight. Still having abdominal pain, but improved with passing some gas. Hasn't been OOB.   Objective:  Blood pressure 135/64, pulse 87, temperature 98.2 F (36.8 C), temperature source Oral, resp. rate (!) 23, height 6\' 1"  (1.854 m), weight 102.2 kg, SpO2 91%.        Intake/Output Summary (Last 24 hours) at 11/12/2023 0904 Last data filed at 11/12/2023 0600 Gross per 24 hour  Intake 3642.2 ml  Output 1765 ml  Net 1877.2 ml   Filed Weights   11/09/23 0451 11/11/23 0446 11/12/23 0500  Weight: 98.6 kg 99.9 kg 102.2 kg    Examination: General: Chronically ill-appearing man lying in bed in no acute distress Neuro: Awake and alert, answering questions appropriately, moving extremities HEENT: [place]/AT, eyes anicteric Cardiovascular: S1-S2, regular rate and rhythm Lungs: Breathing comfortably on nasal cannula, mild tachypnea.  No accessory muscle use.  No rales. Abdomen: Distended, tenderness to palpation but no guarding Musculoskeletal: No peripheral edema Skin: Warm, dry, no diffuse rashes  Na+ 134 Bicarb 20 BUN 41 Creatinine 2.7 Lipase 72 AST 21 ALT 10 Bilirubin 1.1 H/H12/39.2  platelets 136 INR 1.4   Assessment & Plan:   Severe AS, s/p TAVR 1/21. -Continue warfarin, postop care per cardiology  Hypotension -  hypovolemia after Lasix and Toprol.  Has had poor p.o. intake due to pancreatitis.  Responded well to fluids. Hx AoC  HFpEF, CAD s/p DES to LAD 2023 Severe PAD -Per liquid diet, although expected p.o. intake will be minimal - Decrease maintenance fluids, when his p.o. intake increases these need to be discontinued. - Hold Toprol, Jardiance - Continue Pletal, statin, Repatha - Check BNP level, although clinically he does not appear hypervolemic. -Stable ambulate, PT and OT  Aortic arch aneurysm, 4.1 cm, stable for 1 year in 2024 follow up -OP follow up  AKI on CKD Stage IIIb - exacerbated by contrast during TAVR, HoTN, hypovolemia.  Improving today after fluids. NAGMA. -Strict I's/O - Renally dose meds and avoid nephrotoxic meds - Need to be very judicious with additional IV fluids - Continue to monitor renal function  Acute pancreatitis - GB stones noted on CT but none noted on [place]; ductal dilation. -Advance diet as he can tolerate, clear liquids for now.  Expect his intake to be minimal at first. - Eventually will need surgery evaluation as he likely will require cholecystectomy.  No indication for ERCP without ductal dilation. -No need for antibiotics without pancreatic necrosis - Pain control-adding oxycodone and Dilaudid as needed - Maintain euvolemia  Urinary retention -Increase Flomax dose to 0.8 mg - Hopefully can discontinue Foley as renal function continues to improve.  Hx DVT - cont' warfarin, pharmacy to dose. PTA med.  Emphysema; no PFTs but on symbicort PTA -con't Breo, can resume symbicort at discharge  Hypoglycemia  -clear liquid diet; advance as tolerated -D50w PRN  Previous RLL filling defect in bronchus- possibly mucus plugging. Follow up abdominal CT at the same level (could be off by a few cm) is reassurring, but repeat dedicated chest imaging is appropriate to ensure this has resolved fully. H/o tobacco abuse. -CT chest to reassess; if still present needs bronch at some point  Stable to transfer back to the floor today. Will d/w TRH to reassume care for medical  management post-ICU.  Best practice (evaluated daily):  Diet/type: clear liquids DVT prophylaxis: Coumadin Pressure ulcer(s): pressure ulcer assessment deferred  GI prophylaxis: N/A Lines: N/A Foley:  Yes, and it is still needed Code Status:  full code Last date of multidisciplinary goals of care discussion:    Labs   CBC: Recent Labs  Lab 11/08/23 1436 11/09/23 0309 11/10/23 0308 11/11/23 0329 11/12/23 0320  WBC  --  9.4 14.8* 15.0* 10.7*  HGB 15.6 16.3 16.4 14.7 12.9*  HCT 46.0 48.8 49.0 44.7 39.2  MCV  --  95.1 94.0 95.9 95.4  PLT  --  235 243 183 164    Basic Metabolic Panel: Recent Labs  Lab 11/09/23 0309 11/10/23 0308 11/11/23 0325 11/11/23 0329 11/11/23 1336 11/12/23 0320  NA 132* 129*  --  135 132* 134*  K 4.3 4.2  --  4.5 4.1 4.0  CL 101 95*  --  105 103 106  CO2 20* 21*  --  18* 16* 20*  GLUCOSE 117* 138*  --  67* 57* 70  BUN 24* 37*  --  52* 53* 41*  CREATININE 1.61* 2.56*  --  3.84* 3.16* 2.17*  CALCIUM 8.4* 8.6*  --  8.0* 7.7* 7.8*  MG 1.6*  --  1.5*  --   --  1.4*  PHOS  --   --  4.2  --   --  3.0   GFR: Estimated Creatinine Clearance: 35.2 mL/min (A) (by  C-G formula based on SCr of 2.17 mg/dL (H)). Recent Labs  Lab 11/09/23 0309 11/10/23 0308 11/10/23 1003 11/10/23 1236 11/11/23 0329 11/12/23 0320  WBC 9.4 14.8*  --   --  15.0* 10.7*  LATICACIDVEN  --   --  1.2 1.5  --   --     Liver Function Tests: Recent Labs  Lab 11/10/23 1236 11/11/23 0325 11/11/23 1336 11/12/23 0320  AST 46* 24 19 21   ALT 9 6 7 10   ALKPHOS 41 35* 37* 40  BILITOT 1.8* 1.5* 1.2 1.1  PROT 6.9 6.1* 5.9* 5.5*  ALBUMIN 3.5 2.7* 2.5* 2.3*   Recent Labs  Lab 11/10/23 1236 11/11/23 0325 11/12/23 0320  LIPASE 666* 205* 72*  AMYLASE 495*  --   --    No results for input(s): "AMMONIA" in the last 168 hours.  ABG    Component Value Date/Time   PHART 7.405 09/13/2020 1301   PCO2ART 37.0 09/13/2020 1301   PO2ART 56 (L) 09/13/2020 1301   HCO3 24.1  09/28/2023 1118   HCO3 23.5 09/28/2023 1118   TCO2 24 11/08/2023 1436   ACIDBASEDEF 1.0 09/28/2023 1118   ACIDBASEDEF 2.0 09/28/2023 1118   O2SAT 65 09/28/2023 1118   O2SAT 64 09/28/2023 1118     Coagulation Profile: Recent Labs  Lab 11/08/23 1010 11/10/23 1121 11/11/23 0329 11/12/23 0320  INR 1.2 1.1 1.2 1.4*    Steffanie Dunn, DO 11/12/23 9:33 AM [facility]  For contact information, see Amion. If no response to pager, please call PCCM consult pager. After hours, 7PM- 7AM, please call Elink.

## 2023-11-12 NOTE — Progress Notes (Signed)
PHARMACY - ANTICOAGULATION CONSULT NOTE  Pharmacy Consult for Warfarin Indication: VTE history  Allergies  Allergen Reactions   Codeine Hives and Other (See Comments)    Takes benadryl to stop allergic reactions    Patient Measurements: Height: 6\' 1"  (185.4 cm) Weight: 102.2 kg (225 lb 5 oz) IBW/kg (Calculated) : 79.9  Vital Signs: Temp: 99.4 F (37.4 C) (01/25 1115) Temp Source: Oral (01/25 1115) BP: 120/58 (01/25 1400) Pulse Rate: 87 (01/25 1400)  Labs: Recent Labs    11/10/23 0308 11/10/23 1121 11/11/23 0329 11/11/23 1336 11/12/23 0320  HGB 16.4  --  14.7  --  12.9*  HCT 49.0  --  44.7  --  39.2  PLT 243  --  183  --  164  LABPROT  --  14.5 15.9*  --  17.5*  INR  --  1.1 1.2  --  1.4*  CREATININE 2.56*  --  3.84* 3.16* 2.17*    Estimated Creatinine Clearance: 35.2 mL/min (A) (by C-G formula based on SCr of 2.17 mg/dL (H)).   Medical History: Past Medical History:  Diagnosis Date   AAA (abdominal aortic aneurysm) (HCC)    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     CAD (coronary artery disease)    COPD (chronic obstructive pulmonary disease) (HCC)    DJD (degenerative joint disease)    Dyslipidemia    Gout    Malignant hypertension    Peripheral vascular disease (HCC)    Pneumonia 08/18/2013   pt. 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 Excell Seltzer and Dr. Laneta Simmers   Severe aortic stenosis    Venous thrombosis    Recurrent    Assessment: 78 year old male s/p TAVR 1/21, on warfarin PTA for hx VTE. -PTA warfarin regimen - 4 mg Sat / Sun, 2 mg other days  -Warfarin held yesterday (unable to take PO), DVT ppx started while INR < 2 -INR up to 1.4 today > has only received 2 mg since admission  1/21 -Will give small boost tonight and resume PTA dosing tomorrow - allow INR to drift up  -pt aware to make outpt follow up INR check with MD in [place] after DC next week   Goal of Therapy:  INR 2-3 Monitor platelets by anticoagulation protocol: Yes   Plan:  Warfarin 5 mg po x 1 dose today Then resume PTA warfarin 4mg  Sat and Sun and 2mg  AOD Enoxaparin 40 mg for DVT ppx  Daily INR, CBC and BMET  Trixie Rude, PharmD Clinical Pharmacist 11/12/2023  3:05 PM

## 2023-11-12 NOTE — Progress Notes (Addendum)
Seen by PCCM this AM and events reviewed. Discussed with Dr. Chestine Spore and [facility] will resume medical care in the AM as PCCM is transferring him back to the floor today.

## 2023-11-12 NOTE — Progress Notes (Incomplete)
Hypoglycemic Event  CBG: 69  Treatment: D50 50 mL (25 gm)  Symptoms: None  Follow-up CBG: Time:*** CBG Result:***  Possible Reasons for Event: Inadequate meal intake and Other: ***  Comments/MD notified: Elink notified, using PRN d50 and encouraging fluid intake.     Jerold Coombe

## 2023-11-12 NOTE — Progress Notes (Signed)
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  BP 120/58.  Reports abdominal pain and nausea.  Denies chest pain.  Inpatient Medications    Scheduled Meds:  atorvastatin  20 mg Oral Daily   Chlorhexidine Gluconate Cloth  6 each Topical Daily   cilostazol  100 mg Oral Daily   [START ON 11/13/2023] enoxaparin (LOVENOX) injection  40 mg Subcutaneous Daily   fluticasone furoate-vilanterol  1 puff Inhalation Daily   [START ON 11/14/2023] gabapentin  100 mg Oral BID   gabapentin  300 mg Oral BID   sodium chloride flush  3 mL Intravenous Q12H   tamsulosin  0.8 mg Oral QPM   warfarin  5 mg Oral ONCE-1600   Warfarin - Pharmacist Dosing Inpatient   Does not apply q1600   Continuous Infusions:  lactated ringers 50 mL/hr at 11/12/23 1200   PRN Meds: acetaminophen, dextrose, hydrALAZINE, HYDROmorphone (DILAUDID) injection, ondansetron (ZOFRAN) IV, oxyCODONE, sodium chloride flush   Vital Signs    Vitals:   11/12/23 1115 11/12/23 1200 11/12/23 1300 11/12/23 1400  BP:  (!) 154/64 (!) 128/52 (!) 120/58  Pulse:  84 90 87  Resp:  18 (!) 23 19  Temp: 99.4 F (37.4  C)     TempSrc: Oral     SpO2:  92% 92% (!) 89%  Weight:      Height:        Intake/Output Summary (Last 24 hours) at 11/12/2023 1530 Last data filed at 11/12/2023 1200 Gross per 24 hour  Intake 4362.2 ml  Output 2015 ml  Net 2347.2 ml   Filed Weights   11/09/23 0451 11/11/23 0446 11/12/23 0500  Weight: 98.6 kg 99.9 kg 102.2 kg    Physical Exam    GEN:  in no acute distress.  HEENT: Grossly normal.  Neck: Supple, no JVD Cardiac: RRR, no murmurs, rubs, or gallops. No clubbing, cyanosis, edema.   Respiratory:  Respirations regular and unlabored, clear to auscultation bilaterally. GI: tender to palpation MS: no deformity or atrophy. Skin: warm and dry, no rash.  Neuro:  Strength and sensation are intact. Psych: AAOx3.  Normal affect.  Labs    CBC Recent Labs    11/11/23 0329 11/12/23 0320  WBC 15.0* 10.7*  HGB 14.7 12.9*  HCT 44.7 39.2  MCV 95.9 95.4  PLT 183 164   Basic Metabolic Panel Recent Labs    81/19/14 0325 11/11/23 0329 11/11/23 1336 11/12/23 0320  NA  --    < > 132* 134*  K  --    < > 4.1 4.0  CL  --    < > 103 106  CO2  --    < >  16* 20*  GLUCOSE  --    < > 57* 70  BUN  --    < > 53* 41*  CREATININE  --    < > 3.16* 2.17*  CALCIUM  --    < > 7.7* 7.8*  MG 1.5*  --   --  1.4*  PHOS 4.2  --   --  3.0   < > = values in this interval not displayed.    Telemetry    Sinus with rates 80 Personally Reviewed  ECG    Sinus with inferior/anteroseptal q waves, HR 75- Personally Reviewed  Cardiac Studies   TAVR OPERATIVE NOTE     Date of Procedure:                11/08/2023   Preoperative Diagnosis:      Severe Aortic Stenosis    Postoperative Diagnosis:    Same    Procedure:        Transcatheter Aortic Valve Replacement - Open left subclavian Approach             Edwards Sapien 3 Ultra Resilia THV (size 26 mm, serial # [id number])              Co-Surgeons:                        Evelene Croon, MD and Tonny Bollman, MD    Anesthesiologist:                  Corky Sox, MD   Echocardiographer:              Thurmon Fair, MD   Pre-operative Echo Findings: Severe aortic stenosis Normal left ventricular systolic function   Post-operative Echo Findings: No paravalvular leak Normal left ventricular systolic function _____________     Echo 11/09/23: IMPRESSIONS   1. Left ventricular ejection fraction, by estimation, is 65 to 70%. The  left ventricle has normal function. The left ventricle has no regional  wall motion abnormalities. There is mild left ventricular hypertrophy.  Left ventricular diastolic parameters  are indeterminate.   2. Right ventricular systolic function is normal. The right ventricular  size is normal. Tricuspid regurgitation signal is inadequate for assessing  PA pressure.   3. The mitral valve is normal in structure. Trivial mitral valve  regurgitation.   4. The aortic valve has been repaired/replaced. Aortic valve  regurgitation is not visualized. There is a 26 mm Edwards Sapien  prosthetic, stented (TAVR) valve present in the aortic      Procedure Date: 11/08/23. Echo findings are consistent with normal  structure and function of the aortic valve prosthesis. Vmax 1.6 m/s, MG  , EOA 2.1 cm^2, DI 0.74   Patient Profile     Tony Zhang is a 78 y.o. 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   Assessment & Plan    Severe AS: s/p successful TAVR with a 26 mm Edwards Sapien 3 Ultra Resilia THV via the TF approach on 11/09/23. Post operative echo showed EF 65%, normally functioning TAVR with a mean gradient of 5 mmHg and no PVL. Groin/subclavian/radial sites are stable. ECG with sinus and no high grade heart block. Resumed on home Coumadin.   Symptomatic hypotension: pt has had episodic hypotension that responds to IV fluid  boluses.  Echo with normal LV/RV/TAVR function and no effusion.  Likely due to hypotension in setting of pancreatitis.  Continue IVF's  Acute on chronic HFpEF: as evidenced by an elevated LVEDP ~28 mm hg at the time of TAVR. Treated with one dose of IV lasix 40mg /KDur on 1/22. Lasix, Jardiance & Toprol-XL now held given AKI and hypotension. Thankfully, he is breathing okay right now. Watch volume status carefully with IV hydration.  AKI with baseline CKD stage IIIb: baseline creat 1.6-2.37. Creat bumped from 1.61--> 2.56--> 3.8. Pt with oliguria. Likely multifactorial with contrast exposure during TAVR, hypotension and poor PO intake. Increase fluids to 100cc/hr. Consult pharmacy to renally dose gabapentin.  Improving with IV fluids, creatinine down to 2.2 today.  Will continue to monitor  Acute pancreatitis: pt has abdominal pain and CT abdomen showed acute pancreatitis. CRP/lipase/amylase elevated. [facility]/PCCM on board to help with management.  Currently on clear liquid diet.  Continue IV fluids, decreased to 50 cc/h today  Acute urinary retention: oliguria and retention. Now s/p foley. Continue IV hydration.   Hyponatremia: resolved.  CAD: s/p DES to LAD 2023. Pre op [facility] 09/28/23 showed severe calcified stenosis of the RCA unchanged from prior in 2023, patent mid LAD stent with moderate nonobstructive disease proximally, patent left circumflex, and patent subclavian with mild stenosis. Plan for medical management of CAD. Continue on atorvastatin 40 mg daily, Repatha 140mg  q 2weeks. No aspirin given chronic OAC.    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 Continue medical management with Pletal 100mg  daily, atorvastatin 40 mg daily, Repatha 140mg  q 2weeks. No aspirin given chronic OAC.    DVT/PE: resumed on home Coumadin. Will add heparin for DVT  prophylaxis    Signed, Little Ishikawa, MD  11/12/2023, 3:30 PM  Pager [phone]

## 2023-11-12 NOTE — Plan of Care (Signed)

## 2023-11-13 DIAGNOSIS — I10 Essential (primary) hypertension: Secondary | ICD-10-CM

## 2023-11-13 DIAGNOSIS — Z952 Presence of prosthetic heart valve: Secondary | ICD-10-CM | POA: Diagnosis not present

## 2023-11-13 DIAGNOSIS — N179 Acute kidney failure, unspecified: Secondary | ICD-10-CM | POA: Diagnosis not present

## 2023-11-13 DIAGNOSIS — I7409 Other arterial embolism and thrombosis of abdominal aorta: Secondary | ICD-10-CM

## 2023-11-13 DIAGNOSIS — R9389 Abnormal findings on diagnostic imaging of other specified body structures: Secondary | ICD-10-CM | POA: Diagnosis not present

## 2023-11-13 DIAGNOSIS — I7141 Pararenal abdominal aortic aneurysm, without rupture: Secondary | ICD-10-CM

## 2023-11-13 DIAGNOSIS — E861 Hypovolemia: Secondary | ICD-10-CM | POA: Diagnosis not present

## 2023-11-13 DIAGNOSIS — E7800 Pure hypercholesterolemia, unspecified: Secondary | ICD-10-CM

## 2023-11-13 DIAGNOSIS — I82402 Acute embolism and thrombosis of unspecified deep veins of left lower extremity: Secondary | ICD-10-CM

## 2023-11-13 DIAGNOSIS — K859 Acute pancreatitis without necrosis or infection, unspecified: Secondary | ICD-10-CM | POA: Diagnosis not present

## 2023-11-13 LAB — CBC WITH DIFFERENTIAL/PLATELET
Abs Immature Granulocytes: 0.05 10*3/uL (ref 0.00–0.07)
Basophils Absolute: 0 10*3/uL (ref 0.0–0.1)
Basophils Relative: 0 %
Eosinophils Absolute: 0.3 10*3/uL (ref 0.0–0.5)
Eosinophils Relative: 3 %
HCT: 39.4 % (ref 39.0–52.0)
Hemoglobin: 13.1 g/dL (ref 13.0–17.0)
Immature Granulocytes: 1 %
Lymphocytes Relative: 3 %
Lymphs Abs: 0.3 10*3/uL — ABNORMAL LOW (ref 0.7–4.0)
MCH: 31.5 pg (ref 26.0–34.0)
MCHC: 33.2 g/dL (ref 30.0–36.0)
MCV: 94.7 fL (ref 80.0–100.0)
Monocytes Absolute: 0.6 10*3/uL (ref 0.1–1.0)
Monocytes Relative: 6 %
Neutro Abs: 7.6 10*3/uL (ref 1.7–7.7)
Neutrophils Relative %: 87 %
Platelets: 177 10*3/uL (ref 150–400)
RBC: 4.16 MIL/uL — ABNORMAL LOW (ref 4.22–5.81)
RDW: 15.5 % (ref 11.5–15.5)
WBC: 8.8 10*3/uL (ref 4.0–10.5)
nRBC: 0 % (ref 0.0–0.2)

## 2023-11-13 LAB — HEPATIC FUNCTION PANEL
ALT: 11 U/L (ref 0–44)
AST: 44 U/L — ABNORMAL HIGH (ref 15–41)
Albumin: 2.3 g/dL — ABNORMAL LOW (ref 3.5–5.0)
Alkaline Phosphatase: 50 U/L (ref 38–126)
Bilirubin, Direct: 0.3 mg/dL — ABNORMAL HIGH (ref 0.0–0.2)
Indirect Bilirubin: 0.8 mg/dL (ref 0.3–0.9)
Total Bilirubin: 1.1 mg/dL (ref 0.0–1.2)
Total Protein: 5.8 g/dL — ABNORMAL LOW (ref 6.5–8.1)

## 2023-11-13 LAB — COMPREHENSIVE METABOLIC PANEL
ALT: 10 U/L (ref 0–44)
AST: 37 U/L (ref 15–41)
Albumin: 2.3 g/dL — ABNORMAL LOW (ref 3.5–5.0)
Alkaline Phosphatase: 47 U/L (ref 38–126)
Anion gap: 9 (ref 5–15)
BUN: 29 mg/dL — ABNORMAL HIGH (ref 8–23)
CO2: 21 mmol/L — ABNORMAL LOW (ref 22–32)
Calcium: 8.1 mg/dL — ABNORMAL LOW (ref 8.9–10.3)
Chloride: 105 mmol/L (ref 98–111)
Creatinine, Ser: 1.54 mg/dL — ABNORMAL HIGH (ref 0.61–1.24)
GFR, Estimated: 46 mL/min — ABNORMAL LOW (ref >60)
Glucose, Bld: 83 mg/dL (ref 70–99)
Potassium: 4.5 mmol/L (ref 3.5–5.1)
Sodium: 135 mmol/L (ref 135–145)
Total Bilirubin: 1 mg/dL (ref 0.0–1.2)
Total Protein: 5.7 g/dL — ABNORMAL LOW (ref 6.5–8.1)

## 2023-11-13 LAB — PROTIME-INR
INR: 1.4 — ABNORMAL HIGH (ref 0.8–1.2)
Prothrombin Time: 17.8 s — ABNORMAL HIGH (ref 11.4–15.2)

## 2023-11-13 LAB — GLUCOSE, CAPILLARY
Glucose-Capillary: 78 mg/dL (ref 70–99)
Glucose-Capillary: 122 mg/dL — ABNORMAL HIGH (ref 70–99)
Glucose-Capillary: 117 mg/dL — ABNORMAL HIGH (ref 70–99)
Glucose-Capillary: 128 mg/dL — ABNORMAL HIGH (ref 70–99)
Glucose-Capillary: 127 mg/dL — ABNORMAL HIGH (ref 70–99)

## 2023-11-13 LAB — LIPASE, BLOOD: Lipase: 69 U/L — ABNORMAL HIGH (ref 11–51)

## 2023-11-13 LAB — MAGNESIUM: Magnesium: 1.6 mg/dL — ABNORMAL LOW (ref 1.7–2.4)

## 2023-11-13 LAB — PHOSPHORUS: Phosphorus: 2.4 mg/dL — ABNORMAL LOW (ref 2.5–4.6)

## 2023-11-13 MED ORDER — HEPARIN BOLUS VIA INFUSION
2500.0000 [IU] | Freq: Once | INTRAVENOUS | Status: AC
  Administered 2023-11-13: 2500 [IU] via INTRAVENOUS
  Filled 2023-11-13: qty 2500

## 2023-11-13 MED ORDER — HEPARIN (PORCINE) 25000 UT/250ML-% IV SOLN
2300.0000 [IU]/h | INTRAVENOUS | Status: DC
Start: 2023-11-13 — End: 2023-11-15
  Administered 2023-11-13: 1500 [IU]/h via INTRAVENOUS
  Administered 2023-11-14: 1750 [IU]/h via INTRAVENOUS
  Administered 2023-11-14: 2200 [IU]/h via INTRAVENOUS
  Administered 2023-11-15: 2300 [IU]/h via INTRAVENOUS
  Filled 2023-11-13 (×4): qty 250

## 2023-11-13 MED ORDER — WARFARIN SODIUM 5 MG PO TABS
6.0000 mg | ORAL_TABLET | Freq: Once | ORAL | Status: AC
  Administered 2023-11-13: 6 mg via ORAL
  Filled 2023-11-13: qty 1

## 2023-11-13 MED ORDER — SODIUM CHLORIDE 0.9 % IV BOLUS
500.0000 mL | Freq: Once | INTRAVENOUS | Status: AC
  Administered 2023-11-13: 500 mL via INTRAVENOUS

## 2023-11-13 MED ORDER — MAGNESIUM SULFATE 2 GM/50ML IV SOLN
2.0000 g | Freq: Once | INTRAVENOUS | Status: AC
  Administered 2023-11-13: 2 g via INTRAVENOUS
  Filled 2023-11-13: qty 50

## 2023-11-13 MED ORDER — K PHOS MONO-SOD PHOS DI & MONO 155-852-130 MG PO TABS
500.0000 mg | ORAL_TABLET | Freq: Once | ORAL | Status: AC
  Administered 2023-11-13: 500 mg via ORAL
  Filled 2023-11-13: qty 2

## 2023-11-13 NOTE — Progress Notes (Signed)
PHARMACY - ANTICOAGULATION CONSULT NOTE  Pharmacy Consult for Warfarin > IV heparin Indication: VTE history  Allergies  Allergen Reactions   Codeine Hives and Other (See Comments)    Takes benadryl to stop allergic reactions    Patient Measurements: Height: 6\' 1"  (185.4 cm) Weight: 101.7 kg (224 lb 3.3 oz) IBW/kg (Calculated) : 79.9 Heparin dosing weight = 100 kg  Vital Signs: Temp: 98.1 F (36.7 C) (01/26 1541) Temp Source: Axillary (01/26 1541) BP: 134/50 (01/26 1541) Pulse Rate: 88 (01/26 1647)  Labs: Recent Labs    11/11/23 0329 11/11/23 1336 11/12/23 0320 11/13/23 0355  HGB 14.7  --  12.9* 13.1  HCT 44.7  --  39.2 39.4  PLT 183  --  164 177  LABPROT 15.9*  --  17.5* 17.8*  INR 1.2  --  1.4* 1.4*  CREATININE 3.84* 3.16* 2.17* 1.54*    Estimated Creatinine Clearance: 49.5 mL/min (A) (by C-G formula based on SCr of 1.54 mg/dL (H)).   Medical History: Past Medical History:  Diagnosis Date   AAA (abdominal aortic aneurysm) (HCC)    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     CAD (coronary artery disease)    COPD (chronic obstructive pulmonary disease) (HCC)    DJD (degenerative joint disease)    Dyslipidemia    Gout    Malignant hypertension    Peripheral vascular disease (HCC)    Pneumonia 08/18/2013   pt. 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 Excell Seltzer and Dr. Laneta Simmers   Severe aortic stenosis    Venous thrombosis    Recurrent    Assessment: 78 year old male s/p TAVR 1/21, on warfarin PTA for hx recurrent VTE. -PTA warfarin regimen - 4 mg Sat / Sun, 2 mg other days  -Warfarin held yesterday (unable to take PO), DVT ppx started while INR < 2 -INR up to 1.4 today > has only received 2  mg since admission 1/21 -Will give small boost tonight and resume PTA dosing tomorrow - allow INR to drift up  -pt aware to make outpt follow up INR check with MD in [place] after DC next week   Goal of Therapy:  Heparin level 0.3-0.7 units/ml Monitor platelets by anticoagulation protocol: Yes   Plan:  Warfarin 6 mg po x 1 dose today Enoxaparin 40 mg for DVT ppx until INR > 2. Daily INR, CBC and BMET  PM ADDENDUM: Now with plans to start heparin and hold warfarin with impending cholecystectomy per surgery.  Warfarin 6 mg given already.  INR 1.4 this AM.  Received Lovenox 40 mg @1050 .    -STOP warfarin, lovenox ppx -START IV heparin 2500 unit bolus, then 1500 units/hr (reduced bolus; anticipate INR will trend up given two days of boosted doses) -8h anti-Xa -Daily anti-Xa, CBC (will continue to monitor INR for a few days)  Trixie Rude, PharmD Clinical Pharmacist 11/13/2023  5:12 PM

## 2023-11-13 NOTE — Consult Note (Signed)
Reason for Consult/Chief Complaint: Gallstone pancreatitis Referring Provider: Dr. Marland Mcalpine  HPI  Tony Zhang is an 78 y.o. male with history of hypertension, dyslipidemia, CAD, severe aortic stenosis, PVD, history of recurrent venous thrombosis who presented for TAVR on 1/21.  Subsequently developed severe abdominal pain central and radiating to the left per report.  Patient also reported nausea as well as anorexia.  Imaging was obtained and CT scan showed acute pancreatitis as well as gallstones.  Ultrasound showed difficult visualization but dilated gallbladder without obvious stones.  MRCP showed acute pancreatitis and cholelithiasis without acute cholecystitis.  Abs notable for elevated creatinine to 1.54, increased direct bilirubin on 1/25 at 0.4.  No LFTs were drawn today.  Previous leukocytosis has resolved.   10 point review of systems is negative except as listed above in HPI.  Objective  Past Medical History: Past Medical History:  Diagnosis Date   AAA (abdominal aortic aneurysm) (HCC)    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     CAD (coronary artery disease)    COPD (chronic obstructive pulmonary disease) (HCC)    DJD (degenerative joint disease)    Dyslipidemia    Gout    Malignant hypertension    Peripheral vascular disease (HCC)    Pneumonia 08/18/2013   pt. 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 Excell Seltzer and Dr. Laneta Simmers   Severe aortic stenosis    Venous thrombosis    Recurrent    Past Surgical History: Past Surgical History:  Procedure Laterality Date   APPENDECTOMY     BACK SURGERY     COLONOSCOPY N/A 04/13/2015   Procedure: COLONOSCOPY;  Surgeon: Carman Ching, MD;  Location: [facility]  ENDOSCOPY;  Service: Endoscopy;  Laterality: N/A;   CORONARY STENT INTERVENTION N/A 06/04/2022   Procedure: CORONARY STENT INTERVENTION;  Surgeon: Corky Crafts, MD;  Location: [facility] INVASIVE CV LAB;  Service: Cardiovascular;  Laterality: N/A;   EMPYEMA DRAINAGE N/A 11/25/2014   Procedure: EMPYEMA DRAINAGE;  Surgeon: Delight Ovens, MD;  Location: [facility] OR;  Service: Thoracic;  Laterality: N/A;   ESOPHAGOGASTRODUODENOSCOPY N/A 04/10/2015   Procedure: ESOPHAGOGASTRODUODENOSCOPY (EGD);  Surgeon: Charlott Rakes, MD;  Location: [facility] ENDOSCOPY;  Service: Endoscopy;  Laterality: N/A;   EYE SURGERY     both eyes, cataracts removed, denies lens implants   FLEXIBLE SIGMOIDOSCOPY N/A 04/12/2015   Procedure: FLEXIBLE SIGMOIDOSCOPY;  Surgeon: Carman Ching, MD;  Location: [facility] ENDOSCOPY;  Service: Endoscopy;  Laterality: N/A;   HERNIA REPAIR     LEFT HEART CATH AND CORONARY ANGIOGRAPHY N/A 06/04/2022   Procedure: LEFT HEART CATH AND CORONARY ANGIOGRAPHY;  Surgeon: Corky Crafts, MD;  Location: [facility] INVASIVE CV LAB;  Service: Cardiovascular;  Laterality: N/A;   RIGHT HEART CATH AND CORONARY ANGIOGRAPHY N/A 09/28/2023   Procedure: RIGHT HEART CATH AND CORONARY ANGIOGRAPHY;  Surgeon: Tonny Bollman, MD;  Location: [facility] INVASIVE CV LAB;  Service: Cardiovascular;  Laterality: N/A;   SHOULDER SURGERY Right    TRANSCATHETER AORTIC VALVE REPLACEMENT,SUBCLAVIAN Left 11/08/2023   Procedure: Transcatheter Aortic Valve Replacement-Subclavian;  Surgeon: Tonny Bollman, MD;  Location: [facility] INVASIVE CV LAB;  Service: Cardiovascular;  Laterality: Left;   TRANSESOPHAGEAL ECHOCARDIOGRAM (CATH LAB) N/A 11/08/2023   Procedure:  TRANSESOPHAGEAL ECHOCARDIOGRAM;  Surgeon: Tonny Bollman, MD;  Location: [facility] INVASIVE CV LAB;  Service: Cardiovascular;  Laterality: N/A;   VIDEO ASSISTED THORACOSCOPY Right 11/25/2014   Procedure: VIDEO ASSISTED THORACOSCOPY;  Surgeon: Delight Ovens, MD;  Location: [facility] OR;  Service: Thoracic;   Laterality: Right;   VIDEO BRONCHOSCOPY N/A 11/25/2014   Procedure: VIDEO BRONCHOSCOPY;  Surgeon: Delight Ovens, MD;  Location: [facility] OR;  Service: Thoracic;  Laterality: N/A;    Family History:  Family History  Problem Relation Age of Onset   CAD Father 52   Liver cancer Brother    CAD Sister     Social History:  reports that he quit smoking about 31 years ago. His smoking use included cigarettes. He has never used smokeless tobacco. He reports that he does not drink alcohol and does not use drugs.  Allergies:  Allergies  Allergen Reactions   Codeine Hives and Other (See Comments)    Takes benadryl to stop allergic reactions    Medications: I have reviewed the patient's current medications.  Labs: I have personally reviewed all labs for the past 24h Results for orders placed or performed during the hospital encounter of 11/08/23 (from the past 48 hours)  Glucose, capillary     Status: Abnormal   Collection Time: 11/11/23  8:15 PM  Result Value Ref Range   Glucose-Capillary 51 (L) 70 - 99 mg/dL    Comment: Glucose reference range applies only to samples taken after fasting for at least 8 hours.  Glucose, capillary     Status: Abnormal   Collection Time: 11/11/23  8:53 PM  Result Value Ref Range   Glucose-Capillary 57 (L) 70 - 99 mg/dL    Comment: Glucose reference range applies only to samples taken after fasting for at least 8 hours.  Glucose, capillary     Status: Abnormal   Collection Time: 11/11/23  9:40 PM  Result Value Ref Range   Glucose-Capillary 100 (H) 70 - 99 mg/dL    Comment: Glucose reference range applies only to samples taken after fasting for at least 8 hours.  Glucose, capillary     Status: None   Collection Time: 11/12/23 12:31 AM  Result Value Ref Range   Glucose-Capillary 90 70 - 99 mg/dL    Comment: Glucose reference range applies only to samples taken after fasting for at least 8 hours.  CBC     Status: Abnormal   Collection Time: 11/12/23  3:20 AM   Result Value Ref Range   WBC 10.7 (H) 4.0 - 10.5 K/uL   RBC 4.11 (L) 4.22 - 5.81 MIL/uL   Hemoglobin 12.9 (L) 13.0 - 17.0 g/dL   HCT 09.8 11.9 - 14.7 %   MCV 95.4 80.0 - 100.0 fL   MCH 31.4 26.0 - 34.0 pg   MCHC 32.9 30.0 - 36.0 g/dL   RDW 82.9 (H) 56.2 - 13.0 %   Platelets 164 150 - 400 K/uL   nRBC 0.0 0.0 - 0.2 %    Comment: Performed at [facility] Lab, [address] [place], [place] [address]  Basic metabolic panel     Status: Abnormal   Collection Time: 11/12/23  3:20 AM  Result Value Ref Range   Sodium 134 (L) 135 - 145 mmol/L   Potassium 4.0 3.5 - 5.1 mmol/L   Chloride 106 98 - 111 mmol/L   CO2 20 (L) 22 - 32 mmol/L   Glucose, Bld 70 70 - 99 mg/dL    Comment:  Glucose reference range applies only to samples taken after fasting for at least 8 hours.   BUN 41 (H) 8 - 23 mg/dL   Creatinine, Ser 1.61 (H) 0.61 - 1.24 mg/dL   Calcium 7.8 (L) 8.9 - 10.3 mg/dL   GFR, Estimated 30 (L) >60 mL/min    Comment: (NOTE) Calculated using the CKD-EPI Creatinine Equation (2021)    Anion gap 8 5 - 15    Comment: Performed at [facility], [address], [place], [place] [address]  Protime-INR     Status: Abnormal   Collection Time: 11/12/23  3:20 AM  Result Value Ref Range   Prothrombin Time 17.5 (H) 11.4 - 15.2 seconds   INR 1.4 (H) 0.8 - 1.2    Comment: (NOTE) INR goal varies based on device and disease states. Performed at [facility], [address] [place], [place] [address]   Hepatic function panel     Status: Abnormal   Collection Time: 11/12/23  3:20 AM  Result Value Ref Range   Total Protein 5.5 (L) 6.5 - 8.1 g/dL   Albumin 2.3 (L) 3.5 - 5.0 g/dL   AST 21 15 - 41 U/L   ALT 10 0 - 44 U/L   Alkaline Phosphatase 40 38 - 126 U/L   Total Bilirubin 1.1 0.0 - 1.2 mg/dL   Bilirubin, Direct 0.4 (H) 0.0 - 0.2 mg/dL   Indirect Bilirubin 0.7 0.3 - 0.9 mg/dL    Comment: Performed at [facility], [address], [place], [place] [address]  Phosphorus      Status: None   Collection Time: 11/12/23  3:20 AM  Result Value Ref Range   Phosphorus 3.0 2.5 - 4.6 mg/dL    Comment: Performed at [facility], [address], [place], [place] [address]  Magnesium     Status: Abnormal   Collection Time: 11/12/23  3:20 AM  Result Value Ref Range   Magnesium 1.4 (L) 1.7 - 2.4 mg/dL    Comment: Performed at [facility], [address] [place], [place] [address]  Lipase, blood     Status: Abnormal   Collection Time: 11/12/23  3:20 AM  Result Value Ref Range   Lipase 72 (H) 11 - 51 U/L    Comment: Performed at [facility], [address], [place], [place] [address]  Glucose, capillary     Status: Abnormal   Collection Time: 11/12/23  3:20 AM  Result Value Ref Range   Glucose-Capillary 69 (L) 70 - 99 mg/dL    Comment: Glucose reference range applies only to samples taken after fasting for at least 8 hours.  Brain natriuretic peptide     Status: Abnormal   Collection Time: 11/12/23  3:20 AM  Result Value Ref Range   B Natriuretic Peptide 255.0 (H) 0.0 - 100.0 pg/mL    Comment: Performed at [facility], [address], [place], [place] [address]  Glucose, capillary     Status: Abnormal   Collection Time: 11/12/23  4:30 AM  Result Value Ref Range   Glucose-Capillary 137 (H) 70 - 99 mg/dL    Comment: Glucose reference range applies only to samples taken after fasting for at least 8 hours.  Glucose, capillary     Status: Abnormal   Collection Time: 11/12/23  8:36 AM  Result Value Ref Range   Glucose-Capillary 136 (H) 70 - 99 mg/dL    Comment: Glucose reference range applies  only to samples taken after fasting for at least 8 hours.  Glucose, capillary     Status: None   Collection Time: 11/12/23 11:21 AM  Result Value Ref Range   Glucose-Capillary 81 70 - 99 mg/dL    Comment: Glucose reference range applies only to samples taken after fasting for at least 8 hours.  Glucose, capillary     Status: Abnormal   Collection Time:  11/12/23  4:10 PM  Result Value Ref Range   Glucose-Capillary 128 (H) 70 - 99 mg/dL    Comment: Glucose reference range applies only to samples taken after fasting for at least 8 hours.  Glucose, capillary     Status: None   Collection Time: 11/12/23  8:13 PM  Result Value Ref Range   Glucose-Capillary 79 70 - 99 mg/dL    Comment: Glucose reference range applies only to samples taken after fasting for at least 8 hours.  Glucose, capillary     Status: Abnormal   Collection Time: 11/12/23 11:27 PM  Result Value Ref Range   Glucose-Capillary 127 (H) 70 - 99 mg/dL    Comment: Glucose reference range applies only to samples taken after fasting for at least 8 hours.  Protime-INR     Status: Abnormal   Collection Time: 11/13/23  3:55 AM  Result Value Ref Range   Prothrombin Time 17.8 (H) 11.4 - 15.2 seconds   INR 1.4 (H) 0.8 - 1.2    Comment: (NOTE) INR goal varies based on device and disease states. Performed at [facility], [address] [place], [place] [address]   Comprehensive metabolic panel     Status: Abnormal   Collection Time: 11/13/23  3:55 AM  Result Value Ref Range   Sodium 135 135 - 145 mmol/L   Potassium 4.5 3.5 - 5.1 mmol/L   Chloride 105 98 - 111 mmol/L   CO2 21 (L) 22 - 32 mmol/L   Glucose, Bld 83 70 - 99 mg/dL    Comment: Glucose reference range applies only to samples taken after fasting for at least 8 hours.   BUN 29 (H) 8 - 23 mg/dL   Creatinine, Ser 7.84 (H) 0.61 - 1.24 mg/dL   Calcium 8.1 (L) 8.9 - 10.3 mg/dL   Total Protein 5.7 (L) 6.5 - 8.1 g/dL   Albumin 2.3 (L) 3.5 - 5.0 g/dL   AST 37 15 - 41 U/L   ALT 10 0 - 44 U/L   Alkaline Phosphatase 47 38 - 126 U/L   Total Bilirubin 1.0 0.0 - 1.2 mg/dL   GFR, Estimated 46 (L) >60 mL/min    Comment: (NOTE) Calculated using the CKD-EPI Creatinine Equation (2021)    Anion gap 9 5 - 15    Comment: Performed at [facility], [address], [place], [place] [address]  CBC with Differential/Platelet      Status: Abnormal   Collection Time: 11/13/23  3:55 AM  Result Value Ref Range   WBC 8.8 4.0 - 10.5 K/uL   RBC 4.16 (L) 4.22 - 5.81 MIL/uL   Hemoglobin 13.1 13.0 - 17.0 g/dL   HCT 52.8 41.3 - 24.4 %   MCV 94.7 80.0 - 100.0 fL   MCH 31.5 26.0 - 34.0 pg   MCHC 33.2 30.0 - 36.0 g/dL   RDW 01.0 27.2 - 53.6 %   Platelets 177 150 - 400 K/uL   nRBC 0.0 0.0 - 0.2 %   Neutrophils Relative % 87 %  Neutro Abs 7.6 1.7 - 7.7 K/uL   Lymphocytes Relative 3 %   Lymphs Abs 0.3 (L) 0.7 - 4.0 K/uL   Monocytes Relative 6 %   Monocytes Absolute 0.6 0.1 - 1.0 K/uL   Eosinophils Relative 3 %   Eosinophils Absolute 0.3 0.0 - 0.5 K/uL   Basophils Relative 0 %   Basophils Absolute 0.0 0.0 - 0.1 K/uL   Immature Granulocytes 1 %   Abs Immature Granulocytes 0.05 0.00 - 0.07 K/uL    Comment: Performed at [facility], [address] [place], [place] [address]  Magnesium     Status: Abnormal   Collection Time: 11/13/23  3:55 AM  Result Value Ref Range   Magnesium 1.6 (L) 1.7 - 2.4 mg/dL    Comment: Performed at [facility], [address], [place], [place] [address]  Phosphorus     Status: Abnormal   Collection Time: 11/13/23  3:55 AM  Result Value Ref Range   Phosphorus 2.4 (L) 2.5 - 4.6 mg/dL    Comment: Performed at [facility], [address], [place], [place] [address]  Glucose, capillary     Status: None   Collection Time: 11/13/23  3:55 AM  Result Value Ref Range   Glucose-Capillary 78 70 - 99 mg/dL    Comment: Glucose reference range applies only to samples taken after fasting for at least 8 hours.  Lipase, blood     Status: Abnormal   Collection Time: 11/13/23  3:55 AM  Result Value Ref Range   Lipase 69 (H) 11 - 51 U/L    Comment: Performed at [facility], [address], [place], [place] [address]  Glucose, capillary     Status: Abnormal   Collection Time: 11/13/23  8:25 AM  Result Value Ref Range   Glucose-Capillary 122 (H) 70 - 99 mg/dL    Comment:  Glucose reference range applies only to samples taken after fasting for at least 8 hours.  Glucose, capillary     Status: Abnormal   Collection Time: 11/13/23 11:28 AM  Result Value Ref Range   Glucose-Capillary 117 (H) 70 - 99 mg/dL    Comment: Glucose reference range applies only to samples taken after fasting for at least 8 hours.  Glucose, capillary     Status: Abnormal   Collection Time: 11/13/23  4:41 PM  Result Value Ref Range   Glucose-Capillary 128 (H) 70 - 99 mg/dL    Comment: Glucose reference range applies only to samples taken after fasting for at least 8 hours.    Imaging: I have personally reviewed and interpreted all imaging for the past 24h and agree with the radiologist's impression.  CT CHEST WO CONTRAST Result Date: 11/12/2023 CLINICAL DATA:  Follow-up right lower lobe endobronchial lesion EXAM: CT CHEST WITHOUT CONTRAST TECHNIQUE: Multidetector CT imaging of the chest was performed following the standard protocol without IV contrast. RADIATION DOSE REDUCTION: This exam was performed according to the departmental dose-optimization program which includes automated exposure control, adjustment of the mA and/or kV according to patient size and/or use of iterative reconstruction technique. COMPARISON:  10/10/2023 FINDINGS: Cardiovascular: Somewhat limited due to lack of IV contrast. Diffuse atherosclerotic calcifications of the thoracic aorta are noted. Stable dilatation of the proximal mid aortic arch is noted with tapering similar to that seen on the prior exam. Prominence of the ascending aorta to 4 cm is noted. Changes of prior TAVR are seen. Descending thoracic aorta appears within normal  limits. No cardiac enlargement is noted. Heavy coronary calcifications are seen. The pulmonary artery is not significantly enlarged. Mediastinum/Nodes: Thoracic inlet is within normal limits. Scattered small likely reactive lymph nodes are noted throughout the mediastinum stable from the prior  exam. The esophagus as visualized is within normal limits. Lungs/Pleura: Mild emphysematous changes are noted throughout both lungs. Mild scarring is noted in the lower lobes bilaterally stable from previous exam. The previously seen endobronchial filling defect in the right lower lobe bronchus is no longer identified and likely represented mucous plugging. No new endobronchial lesions are seen. Minimal nodularity is again noted stable from the prior exam as well as dating back to an exam from 2023. Upper Abdomen: Visualized upper abdomen is unremarkable. Musculoskeletal: Degenerative changes of the thoracic spine are seen. No acute rib abnormality is noted. IMPRESSION: Previously seen endobronchial lesion is no longer identified consistent with transient mucous plugging. No new endobronchial lesion is seen. Stable atherosclerotic changes of the thoracic aorta with areas of mild dilatation in the ascending aorta and proximal aortic arch similar to that seen on the recent exam. Recommend semi-annual imaging followup by CTA or MRA and referral to cardiothoracic surgery if not already obtained. This recommendation follows 2010 ACCF/AHA/AATS/ACR/ASA/SCA/SCAI/SIR/STS/SVM Guidelines for the Diagnosis and Management of Patients With Thoracic Aortic Disease. Circulation. 2010; 121: N829-F62. Aortic aneurysm NOS (ICD10-I71.9) Aortic Atherosclerosis (ICD10-I70.0) and Emphysema (ICD10-J43.9). Electronically Signed   By: Alcide Clever M.D.   On: 11/12/2023 20:34     Physical Exam Blood pressure (!) 134/50, pulse 88, temperature 98.1 F (36.7 C), temperature source Axillary, resp. rate 17, height 6\' 1"  (1.854 m), weight 101.7 kg, SpO2 99%. Constitutional: elderly obese male CV: Regular rate and rhythm Chest: equal chest rise bilaterally, diminished breath sounds on 5L [place] Abdomen: protuberant. Tender to palpation to epigastrium, RUQ, and central abdomen Extremities: moves all extremities Skin: warm, dry, no  rashes Psych: Appropriate Neuro: No focal neurologic deficits, A&Ox3    Assessment   Tony Zhang is an 78 y.o. 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  Plan  -Discontinue Coumadin, start heparin drip -Would not advance diet at this time, if he has any worsening nausea, emesis, or abdominal pain or if lipase increases, then would make n.p.o. again and start IV fluids -Discussed cardiac risk with primary team, cardiology, given recent TAVR.  Although ideally the timeframe is 1 month post TAVR for elective surgery, given the patient is quite symptomatic, cardiology states low enough risk that can consider surgery during this hospitalization. We will see how patient progresses and determine if appropriate for surgery during hospitalization. - Daily LFTs, daily lipase, I have added on LFTs to AM labs from today, results pending  I relayed my recommendations to the primary cardiology service, is well as Dr. Marland Mcalpine with [facility].  I reviewed hospitalist notes, last 24 h vitals and pain scores, last 48 h intake and output, last 24 h labs and trends, last 24 h imaging results, and cardiology notes .  This care required high  level of medical decision making.    Donata Duff, MD [facility] Surgery

## 2023-11-13 NOTE — Progress Notes (Signed)
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  BP 97/60.  On 5L [place].  Denies any chest pain or dyspnea.  Reports abdominal pain and nausea have improved  Inpatient Medications    Scheduled Meds:  atorvastatin  20 mg Oral Daily   Chlorhexidine Gluconate Cloth  6 each Topical Daily   cilostazol  100 mg Oral Daily   enoxaparin (LOVENOX) injection  40 mg Subcutaneous Daily   fluticasone furoate-vilanterol  1 puff Inhalation Daily   [START ON 11/14/2023] gabapentin  100 mg Oral BID   gabapentin  300 mg Oral BID   phosphorus  500 mg Oral Once   sodium chloride flush  3 mL Intravenous Q12H   tamsulosin  0.8 mg Oral QPM   Warfarin - Pharmacist Dosing Inpatient   Does not apply q1600   Continuous Infusions:  magnesium sulfate bolus IVPB 2 g (11/13/23 0833)   PRN Meds: acetaminophen, dextrose, hydrALAZINE, HYDROmorphone (DILAUDID) injection, ondansetron (ZOFRAN) IV, oxyCODONE, sodium chloride flush   Vital Signs    Vitals:   11/13/23 0615 11/13/23 0630 11/13/23 0645 11/13/23 0700  BP:  (!) 114/52   97/60  Pulse: 79 86 (!) 110 (!) 103  Resp: 18 (!) 22 (!) 25 17  Temp:      TempSrc:      SpO2: 91% 92% 90% 93%  Weight:      Height:        Intake/Output Summary (Last 24 hours) at 11/13/2023 0855 Last data filed at 11/13/2023 0600 Gross per 24 hour  Intake 976.32 ml  Output 2850 ml  Net -1873.68 ml   Filed Weights   11/11/23 0446 11/12/23 0500 11/13/23 0500  Weight: 99.9 kg 102.2 kg 101.7 kg    Physical Exam    GEN:  in no acute distress.  HEENT: Grossly normal.  Neck: Supple, no JVD Cardiac: RRR, no murmurs, rubs, or gallops. No clubbing, cyanosis, edema.   Respiratory:  Respirations regular and unlabored, clear to auscultation bilaterally. GI: tender to palpation MS: no deformity or atrophy. Skin: warm and dry, no rash.  Neuro:  Strength and sensation are intact. Psych: AAOx3.  Normal affect.  Labs    CBC Recent Labs    11/12/23 0320 11/13/23 0355  WBC 10.7* 8.8  NEUTROABS  --  7.6  HGB 12.9* 13.1  HCT 39.2 39.4  MCV 95.4 94.7  PLT 164 177   Basic Metabolic Panel Recent Labs    09/81/19 0320 11/13/23 0355  NA 134* 135  K 4.0 4.5  CL 106 105  CO2 20* 21*  GLUCOSE 70 83  BUN 41*  29*  CREATININE 2.17* 1.54*  CALCIUM 7.8* 8.1*  MG 1.4* 1.6*  PHOS 3.0 2.4*    Telemetry    Sinus with rates 80 Personally Reviewed  ECG    Sinus with inferior/anteroseptal q waves, HR 75- Personally Reviewed  Cardiac Studies   TAVR OPERATIVE NOTE     Date of Procedure:                11/08/2023   Preoperative Diagnosis:      Severe Aortic Stenosis    Postoperative Diagnosis:    Same    Procedure:        Transcatheter Aortic Valve Replacement - Open left subclavian Approach             Edwards Sapien 3 Ultra Resilia THV (size 26 mm, serial # [id number])              Co-Surgeons:                        Evelene Croon, MD and Tonny Bollman, MD   Anesthesiologist:                  Corky Sox, MD   Echocardiographer:              Thurmon Fair, MD    Pre-operative Echo Findings: Severe aortic stenosis Normal left ventricular systolic function   Post-operative Echo Findings: No paravalvular leak Normal left ventricular systolic function _____________     Echo 11/09/23: IMPRESSIONS   1. Left ventricular ejection fraction, by estimation, is 65 to 70%. The  left ventricle has normal function. The left ventricle has no regional  wall motion abnormalities. There is mild left ventricular hypertrophy.  Left ventricular diastolic parameters  are indeterminate.   2. Right ventricular systolic function is normal. The right ventricular  size is normal. Tricuspid regurgitation signal is inadequate for assessing  PA pressure.   3. The mitral valve is normal in structure. Trivial mitral valve  regurgitation.   4. The aortic valve has been repaired/replaced. Aortic valve  regurgitation is not visualized. There is a 26 mm Edwards Sapien  prosthetic, stented (TAVR) valve present in the aortic      Procedure Date: 11/08/23. Echo findings are consistent with normal  structure and function of the aortic valve prosthesis. Vmax 1.6 m/s, MG  , EOA 2.1 cm^2, DI 0.74   Patient Profile     Tony Zhang is a 78 y.o. 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 on 11/08/23 for planned TAVR.   Assessment & Plan    Severe AS: s/p successful TAVR with a 26 mm Edwards Sapien 3 Ultra Resilia THV via the TF approach on 11/09/23. Post operative echo showed EF 65%, normally functioning TAVR with a mean gradient of 5 mmHg and no PVL. Groin/subclavian/radial sites are stable. ECG with sinus and no high grade heart block. Resumed on home Coumadin.   Symptomatic hypotension: pt has had episodic hypotension that responds to IV fluid boluses.  Echo with normal LV/RV/TAVR function and no effusion.  Likely due to hypotension in setting of  pancreatitis.  Resolved with IV fluids.  Acute on chronic HFpEF: as evidenced by an elevated LVEDP ~28 mm hg at the time of TAVR. Treated with one dose of IV lasix 40mg /KDur on 1/22. Lasix, Jardiance & Toprol-XL now  held given AKI and hypotension. Thankfully, he is breathing okay right now. Watch volume status carefully with IV hydration.  Would favor stopping IV fluids today given resolution of AKI and hypotension, and now with improved PO intake  AKI with baseline CKD stage IIIb: baseline creat 1.6-2.37. Creat bumped from 1.61--> 2.56--> 3.8. Pt with oliguria. Likely multifactorial with contrast exposure during TAVR, hypotension and poor PO intake.Resolved with IV fluids, creatinine down to 1.5 today  Acute pancreatitis: pt has abdominal pain and CT abdomen showed acute pancreatitis. CRP/lipase/amylase elevated.  TRH on board to help with management.  Improving, currently on full liquid diet. IVF discontinued today  Acute urinary retention: oliguria and retention. Now s/p foley.   Hyponatremia: resolved.  CAD: s/p DES to LAD 2023. Pre op [facility] 09/28/23 showed severe calcified stenosis of the RCA unchanged from prior in 2023, patent mid LAD stent with moderate nonobstructive disease proximally, patent left circumflex, and patent subclavian with mild stenosis. Plan for medical management of CAD. Continue on atorvastatin 40 mg daily, Repatha 140mg  q 2weeks. No aspirin given chronic OAC.    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 Continue medical management with Pletal 100mg  daily, atorvastatin 40 mg daily, Repatha 140mg  q 2weeks. No aspirin given chronic OAC.    DVT/PE: resumed on home Coumadin.  Heparin for DVT prophylaxis    Signed, Little Ishikawa, MD  11/13/2023, 8:55 AM  Pager [phone]

## 2023-11-13 NOTE — Progress Notes (Signed)
PHARMACY - ANTICOAGULATION CONSULT NOTE  Pharmacy Consult for Warfarin Indication: VTE history  Allergies  Allergen Reactions   Codeine Hives and Other (See Comments)    Takes benadryl to stop allergic reactions    Patient Measurements: Height: 6\' 1"  (185.4 cm) Weight: 101.7 kg (224 lb 3.3 oz) IBW/kg (Calculated) : 79.9  Vital Signs: Temp: 98.7 F (37.1 C) (01/26 1115) Temp Source: Oral (01/26 1115) BP: 90/31 (01/26 1000) Pulse Rate: 70 (01/26 1000)  Labs: Recent Labs    11/11/23 0329 11/11/23 1336 11/12/23 0320 11/13/23 0355  HGB 14.7  --  12.9* 13.1  HCT 44.7  --  39.2 39.4  PLT 183  --  164 177  LABPROT 15.9*  --  17.5* 17.8*  INR 1.2  --  1.4* 1.4*  CREATININE 3.84* 3.16* 2.17* 1.54*    Estimated Creatinine Clearance: 49.5 mL/min (A) (by C-G formula based on SCr of 1.54 mg/dL (H)).   Medical History: Past Medical History:  Diagnosis Date   AAA (abdominal aortic aneurysm) (HCC)    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     CAD (coronary artery disease)    COPD (chronic obstructive pulmonary disease) (HCC)    DJD (degenerative joint disease)    Dyslipidemia    Gout    Malignant hypertension    Peripheral vascular disease (HCC)    Pneumonia 08/18/2013   pt. 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 Excell Seltzer and Dr. Laneta Simmers   Severe aortic stenosis    Venous thrombosis    Recurrent    Assessment: 78 year old male s/p TAVR 1/21, on warfarin PTA for hx VTE. -PTA warfarin regimen - 4 mg Sat / Sun, 2 mg other days  -Warfarin held yesterday (unable to take PO), DVT ppx started while INR < 2 -INR up to 1.4 today > has only received 2 mg since admission 1/21 -Will give small boost tonight and  resume PTA dosing tomorrow - allow INR to drift up  -pt aware to make outpt follow up INR check with MD in [place] after DC next week   Goal of Therapy:  INR 2-3 Monitor platelets by anticoagulation protocol: Yes   Plan:  Warfarin 6 mg po x 1 dose today  Enoxaparin 40 mg for DVT ppx until INR > 2. Daily INR, CBC and BMET  Reece Leader, Colon Flattery, [facility] Clinical Pharmacist  11/13/2023 2:08 PM   [facility] pharmacy phone numbers are listed on [email or web address]

## 2023-11-13 NOTE — Progress Notes (Signed)
PROGRESS NOTE    DUVALL COMES  ZOX:[id number] DOB: [birth year] DOA: 11/08/2023 PCP: System, Provider Not In   Brief Narrative:  Tony Zhang is a 78 y.o. male with past medical history of hypertension, dyslipidemia, CAD, severe aortic stenosis, PVD, history of recurrent venous thrombosis, presented for transcatheter aortic valve replacement.  Following the procedure performed on 1/21 patient reported developing severe abdominal pain in the upper region, central and radiating to the left side, two days after surgery. The pain is persistent and does not move. Accompanying the pain is a feeling of nausea, for which medication was administered. However, the medication seems to induce excessive sleepiness. The patient denies any history of alcohol use.   There is no prior history of pancreatitis, a condition that the recent CT scan suggests. The patient also reports a decrease in appetite, consuming minimal food. Pain management is currently through Tylenol, as other pain medications have previously caused stomach upset. The patient's last bowel movement was least 2 days ago.  Labs today note creatinine 2.56 with BUN 37.  Creatinine was noted to be 1.61 with BUN 27 yesterday.  Records note patient had been given Lasix 40 mg IV and was noted to be transiently hypotensive.   With his progressive decline throughout the day on 11/11/23 the primary team transferred to the ICU and consulted critical care.  He will be continued IV fluid hydration and will continue supportive care.  He was stablized and given IVF and now doing better  TRH now resuming medical care again on 11/13/23. Pancreatitis is improving slowly and will advance diet to FULL with continued monitoring. BP still on the soft side so will give another 500 mL bolus.   Assessment and Plan:  Acute Pancreatitis -CT Abd/Pelvis done and showed " Acute Pancreatitis. Secondary inflammation of the duodenum and trace free fluid. Hyperdense urine within the  urinary bladder. Nonobstructed kidneys. Correlate with urinalysis. Otherwise stable noncontrast CT appearance of the abdomen and pelvis. 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 Cholelithiasis. Aortic Atherosclerosis" -RUQ U/S done and showed "Limited examination with overlapping bowel gas and soft tissue. Poor visualization of the pancreas, common duct. Dilated gallbladder without obvious stones. There was a stone in the gallbladder on CT." -Lipase Level Improving and now likely Plateaued; Lipase Level went from  -IVF now stopped by Primary  -Supportive Care and c/w Antiemetics with Ondansetron 4 mg q6hprn Nausea and Vomiting  -Checked MRI/MRCP to evaluate for choledocholithiasis and showed "Acute pancreatitis. No evidence of pancreatic necrosis. No organized fluid collections. Cholelithiasis without evidence of acute cholecystitis. No choledocholithiasis. No biliary duct dilatation." -Clear liquid diet advanced to FULL and will leave on FULL Today  -WBC Trend: Recent Labs  Lab 11/04/23 0807 11/09/23 0309 11/10/23 0308 11/11/23 0329 11/12/23 0320 11/13/23 0355  WBC 8.8 9.4 14.8* 15.0* 10.7* 8.8  -CXR and KUB done and showed "Likely bilateral trace pleural effusions. Nonobstructive bowel gas pattern.  Aortic Atherosclerosis" -Continue Slow diet advancement and Fluids as needed.  -C/w Pain Control with Acetaminophen 1000 mg po q6hprn Mild Pain, Oxycodone IR 5 mg po q4hprn Moderate Pain, and IV Hydromorphone 0.5 mg q4hprn Break Through Pain -Discussed with General Surgery and they will have the patient follow up in the outpatient setting for discussion about cholecystectomy -No Need for Abx as patient does not have Pancreatic Necrosis and because SIRS is improved   Lethargy, improved -Transferred to ICU per Primary on 11/11/23 and now being transferred to the Floor -Appreciated PCCM evaluation  Transient Hypotension -In the setting of Hypovolemia after Furosemide and  Metoprolol Administration as well as having poor Po Intake -Blood pressures noted to be as low as 90/31.  Suspect secondary to fluid losses and was also noted to be when he was  -IV fluids now discontinued by Primary; Patient responded to IVF Hydration an since BP is on the softer side still will give another 500 mL bolus. -Avoid blood pressure lowering medications at this time. -Being Transferred out of the unit but if necessary will need further IVF Hydration   Hx AoC HFpEF CAD s/p DES to LAD 2023 Severe PAD with known occlusion of the distal abdominal aorta, renal artery stenosis, celiac/mesenteric artery stenosis, and total RICA occulsion and 40-50% LICA occulusion -C/w liquid diet and advanced to FULL, although expected p.o. intake will be minimal -Was given a dose of IV Lasix 40 mg 1/22 -Maintenance IVF discontinued by Cardiology -Continue to hold Metoprolol, Jardiance -Continue Cilostazol 100 mg po Daily, Atorvastatin 20 mg po Daily, Repatha; No ASA given Warfarain  -Checked BNP level and was 255, although clinically he does not appear hypervolemic. -Per Cardiology Medical Management of CAD -C/w OOB and Ambulation and PT/OT to further evaluate and Treat  Acute Urinary Retention -S/p IVF and recommend TOV for Catheter when more mobile and recommend that be done today tor tomorrow -C/w Tamsulosin 0.8 mg po qHS  Severe Aortic Stenosis -Status post TAVR postoperative day 5 -Per primary and C/w Post-op Care Per Cardiology  -Anticoagulation with Warfarin has been resumed; Warfarin Dose per Pharmacy  Previous RLL filling defect in bronchus -Possibly mucus plugging. -Follow up abdominal CT at the same level (could be off by a few cm) is reassurring, but repeat dedicated chest imaging is appropriate to ensure this has resolved fully. H/o tobacco abuse. -CT chest to reassess and Per PCCM was "reassuring that the previously seen endobronchial lesion was benign. No follow up CT required for  this. 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 and stable for 1 year in the 2024 F/U -C/w Outpatient Monitoring and Follow Up  Hx of DVT -Anticoagulation with Coumadin resumed   Hypomagnesemia -Patient's Mag Level Trend: Recent Labs  Lab 11/09/23 0309 11/11/23 0325 11/12/23 0320 11/13/23 0355  MG 1.6* 1.5* 1.4* 1.6*  -Replete with IV Mag Sulfate 2 grams -Continue to Monitor and Replete as Necessary -Repeat Mag in the AM   Hypophosphatemia -Phos Level Trend: Recent Labs  Lab 11/11/23 0325 11/12/23 0320 11/13/23 0355  PHOS 4.2 3.0 2.4*  -Replete with po K Phos Neutral 500 mg x1 -Continue to Monitor and Replete as Necessary -Repeat Phos Level in the AM  AKI on CKD Stage 3b, improving  Metabolic Acidosis, mild -BUN/Cr Trend: Recent Labs  Lab 11/08/23 1436 11/09/23 0309 11/10/23 0308 11/11/23 0329 11/11/23 1336 11/12/23 0320 11/13/23 0355  BUN 27* 24* 37* 52* 53* 41* 29*  CREATININE 1.60* 1.61* 2.56* 3.84* 3.16* 2.17* 1.54*  -Metabolic Acidosis improving; Has a Slight Metabolic Acidosis still with a CO2 of 21, AG of 9, Chloride Level of 105 -Avoid Nephrotoxic Medications, Contrast Dyes, Hypotension and Dehydration to Ensure Adequate Renal Perfusion and will need to Renally Adjust Meds -C.w Strict I's and O's and Monitoring of UOP -Continue to Monitor and Trend Renal Function carefully and repeat CMP in the AM   Hyperbilirubinemia, improved  -T Bili Trend: Recent Labs  Lab 11/04/23 0807 11/10/23 1236 11/11/23 0325 11/11/23 1336 11/12/23 0320 11/13/23 0355  BILITOT  1.1 1.8* 1.5* 1.2 1.1 1.0  -Continue to Monitor and Trend and Repeat CMP in the AM  Hypoalbuminemia -Patient's Albumin Trend: Recent Labs  Lab 11/04/23 0807 11/10/23 1236 11/11/23 0325 11/11/23 1336 11/12/23 0320 11/13/23 0355  ALBUMIN 4.1 3.5 2.7* 2.5* 2.3* 2.3*  -Continue to Monitor and Trend and repeat CMP in the AM   DVT  prophylaxis: enoxaparin (LOVENOX) injection 40 mg Start: 11/13/23 1000 SCDs Start: 11/08/23 1647    Code Status: Full Code Family Communication: No family present at bedside  Disposition Plan:  Level of care: Telemetry Cardiac Status is: Inpatient Remains inpatient appropriate because: Needs further clinical improvement and tolerance of a Diet   Consultants:  Cardiology Primary PCCM TRH  Procedures:  As delineated as above  Antimicrobials:  Anti-infectives (From admission, onward)    Start     Dose/Rate Route Frequency Ordered Stop   11/08/23 2100  ceFAZolin (ANCEF) IVPB 2g/100 mL premix        2 g 200 mL/hr over 30 Minutes Intravenous Every 8 hours 11/08/23 1647 11/09/23 0548   11/08/23 0400  ceFAZolin (ANCEF) IVPB 2g/100 mL premix  Status:  Discontinued        2 g 200 mL/hr over 30 Minutes Intravenous To Surgery 11/07/23 1323 11/08/23 1514       Subjective: Seen and examined at bedside and doing better but still having some abdominal discomfort. Nausea is improved. No CP or SOB. No other concerns or complaints at this time.   Objective: Vitals:   11/13/23 0800 11/13/23 0857 11/13/23 0900 11/13/23 1000  BP: (!) 130/42  (!) 113/36 (!) 90/31  Pulse: 85 78 82 70  Resp: 15 (!) 21 18 10   Temp:      TempSrc:      SpO2: 93% 93% 94% 94%  Weight:      Height:        Intake/Output Summary (Last 24 hours) at 11/13/2023 1045 Last data filed at 11/13/2023 0600 Gross per 24 hour  Intake 476.32 ml  Output 2750 ml  Net -2273.68 ml   Filed Weights   11/11/23 0446 11/12/23 0500 11/13/23 0500  Weight: 99.9 kg 102.2 kg 101.7 kg   Examination: Physical Exam:  Constitutional: WN/WD obese chronically ill appearing Cacuasian male in NAD Respiratory: Diminished to auscultation bilaterally, no wheezing, rales, rhonchi or crackles. Normal respiratory effort and patient is not tachypenic. No accessory muscle use.  Cardiovascular: RRR, no murmurs / rubs / gallops. S1 and S2  auscultated. Minimal extremity edema Abdomen: Soft, tender to palpate, Distended 2/2 body habitus. Bowel sounds positive.  GU: Deferred. Musculoskeletal: No clubbing / cyanosis of digits/nails. No joint deformity upper and lower extremities.  Skin: No rashes, lesions, ulcers on a limited skin evaluation. No induration; Warm and dry.  Neurologic: CN 2-12 grossly intact with no focal deficits.  Romberg sign and cerebellar reflexes not assessed.  Psychiatric: Normal judgment and insight. Alert and oriented x 3. Normal mood and appropriate affect.   Data Reviewed: I have personally reviewed following labs and imaging studies  CBC: Recent Labs  Lab 11/09/23 0309 11/10/23 0308 11/11/23 0329 11/12/23 0320 11/13/23 0355  WBC 9.4 14.8* 15.0* 10.7* 8.8  NEUTROABS  --   --   --   --  7.6  HGB 16.3 16.4 14.7 12.9* 13.1  HCT 48.8 49.0 44.7 39.2 39.4  MCV 95.1 94.0 95.9 95.4 94.7  PLT 235 243 183 164 177   Basic Metabolic Panel: Recent Labs  Lab 11/09/23 0309 11/10/23  0308 11/11/23 0325 11/11/23 0329 11/11/23 1336 11/12/23 0320 11/13/23 0355  NA 132* 129*  --  135 132* 134* 135  K 4.3 4.2  --  4.5 4.1 4.0 4.5  CL 101 95*  --  105 103 106 105  CO2 20* 21*  --  18* 16* 20* 21*  GLUCOSE 117* 138*  --  67* 57* 70 83  BUN 24* 37*  --  52* 53* 41* 29*  CREATININE 1.61* 2.56*  --  3.84* 3.16* 2.17* 1.54*  CALCIUM 8.4* 8.6*  --  8.0* 7.7* 7.8* 8.1*  MG 1.6*  --  1.5*  --   --  1.4* 1.6*  PHOS  --   --  4.2  --   --  3.0 2.4*   GFR: Estimated Creatinine Clearance: 49.5 mL/min (A) (by C-G formula based on SCr of 1.54 mg/dL (H)). Liver Function Tests: Recent Labs  Lab 11/10/23 1236 11/11/23 0325 11/11/23 1336 11/12/23 0320 11/13/23 0355  AST 46* 24 19 21  37  ALT 9 6 7 10 10   ALKPHOS 41 35* 37* 40 47  BILITOT 1.8* 1.5* 1.2 1.1 1.0  PROT 6.9 6.1* 5.9* 5.5* 5.7*  ALBUMIN 3.5 2.7* 2.5* 2.3* 2.3*   Recent Labs  Lab 11/10/23 1236 11/11/23 0325 11/12/23 0320 11/13/23 0355  LIPASE  666* 205* 72* 69*  AMYLASE 495*  --   --   --    No results for input(s): "AMMONIA" in the last 168 hours. Coagulation Profile: Recent Labs  Lab 11/08/23 1010 11/10/23 1121 11/11/23 0329 11/12/23 0320 11/13/23 0355  INR 1.2 1.1 1.2 1.4* 1.4*   Cardiac Enzymes: No results for input(s): "CKTOTAL", "CKMB", "CKMBINDEX", "TROPONINI" in the last 168 hours. BNP (last 3 results) No results for input(s): "PROBNP" in the last 8760 hours. HbA1C: No results for input(s): "HGBA1C" in the last 72 hours. CBG: Recent Labs  Lab 11/12/23 1610 11/12/23 2013 11/12/23 2327 11/13/23 0355 11/13/23 0825  GLUCAP 128* 79 127* 78 122*   Lipid Profile: No results for input(s): "CHOL", "HDL", "LDLCALC", "TRIG", "CHOLHDL", "LDLDIRECT" in the last 72 hours. Thyroid Function Tests: No results for input(s): "TSH", "T4TOTAL", "FREET4", "T3FREE", "THYROIDAB" in the last 72 hours. Anemia Panel: No results for input(s): "VITAMINB12", "FOLATE", "FERRITIN", "TIBC", "IRON", "RETICCTPCT" in the last 72 hours. Sepsis Labs: Recent Labs  Lab 11/10/23 1003 11/10/23 1236  LATICACIDVEN 1.2 1.5   Recent Results (from the past 240 hours)  Surgical pcr screen     Status: Abnormal   Collection Time: 11/04/23  8:06 AM   Specimen: Nasal Mucosa; Nasal Swab  Result Value Ref Range Status   MRSA, PCR NEGATIVE NEGATIVE Final   Staphylococcus aureus POSITIVE (A) NEGATIVE Final    Comment: (NOTE) The Xpert SA Assay (FDA approved for NASAL specimens in patients 66 years of age and older), is one component of a comprehensive surveillance program. It is not intended to diagnose infection nor to guide or monitor treatment. Performed at [facility], [address] [place], [place] [address]    Radiology Studies: CT CHEST WO CONTRAST Result Date: 11/12/2023 CLINICAL DATA:  Follow-up right lower lobe endobronchial lesion EXAM: CT CHEST WITHOUT CONTRAST TECHNIQUE: Multidetector CT imaging of the chest was  performed following the standard protocol without IV contrast. RADIATION DOSE REDUCTION: This exam was performed according to the departmental dose-optimization program which includes automated exposure control, adjustment of the mA and/or kV according to patient size and/or use of iterative reconstruction technique. COMPARISON:  10/10/2023 FINDINGS:  Cardiovascular: Somewhat limited due to lack of IV contrast. Diffuse atherosclerotic calcifications of the thoracic aorta are noted. Stable dilatation of the proximal mid aortic arch is noted with tapering similar to that seen on the prior exam. Prominence of the ascending aorta to 4 cm is noted. Changes of prior TAVR are seen. Descending thoracic aorta appears within normal limits. No cardiac enlargement is noted. Heavy coronary calcifications are seen. The pulmonary artery is not significantly enlarged. Mediastinum/Nodes: Thoracic inlet is within normal limits. Scattered small likely reactive lymph nodes are noted throughout the mediastinum stable from the prior exam. The esophagus as visualized is within normal limits. Lungs/Pleura: Mild emphysematous changes are noted throughout both lungs. Mild scarring is noted in the lower lobes bilaterally stable from previous exam. The previously seen endobronchial filling defect in the right lower lobe bronchus is no longer identified and likely represented mucous plugging. No new endobronchial lesions are seen. Minimal nodularity is again noted stable from the prior exam as well as dating back to an exam from 2023. Upper Abdomen: Visualized upper abdomen is unremarkable. Musculoskeletal: Degenerative changes of the thoracic spine are seen. No acute rib abnormality is noted. IMPRESSION: Previously seen endobronchial lesion is no longer identified consistent with transient mucous plugging. No new endobronchial lesion is seen. Stable atherosclerotic changes of the thoracic aorta with areas of mild dilatation in the ascending  aorta and proximal aortic arch similar to that seen on the recent exam. Recommend semi-annual imaging followup by CTA or MRA and referral to cardiothoracic surgery if not already obtained. This recommendation follows 2010 ACCF/AHA/AATS/ACR/ASA/SCA/SCAI/SIR/STS/SVM Guidelines for the Diagnosis and Management of Patients With Thoracic Aortic Disease. Circulation. 2010; 121: N562-Z30. Aortic aneurysm NOS (ICD10-I71.9) Aortic Atherosclerosis (ICD10-I70.0) and Emphysema (ICD10-J43.9). Electronically Signed   By: Alcide Clever M.D.   On: 11/12/2023 20:34   MR 3D Recon At Scanner Result Date: 11/12/2023 : CLINICAL DATA: Pancreatitis. Acute severe pancreatitis. EXAM: MRI ABDOMEN WITHOUT CONTRAST (INCLUDING MRCP) TECHNIQUE: Multiplanar multisequence MR imaging of the abdomen was performed. Heavily T2-weighted images of the biliary and pancreatic ducts were obtained, and three-dimensional MRCP images were rendered by post processing. COMPARISON: CT 11/10/2023 FINDINGS: Lower chest: Lung bases are clear. Hepatobiliary: 6 mm gallstone towards the neck of the gallbladder (image 22/series 2). No gallbladder distension. No additional gallstones are evident. The common bile duct is normal caliber. No choledocholithiasis. Normal liver parenchyma. Benign cyst in the posterior RIGHT hepatic lobe. No biliary duct dilatation. Pancreas: There is mild retroperitoneal fluid stranding along the body of the pancreas extending into the anterior LEFT and RIGHT pararenal fascia. Small amount fluid extends along the LEFT pericolic gutter inferior to the spleen. No organized fluid collections. Postcontrast T1 weighted imaging is mildly degraded by respiratory motion. Pancreas is mildly edematous. No evidence of pancreatic necrosis. The pancreatic duct is normal caliber. No evidence of ductal interruption. Spleen: Normal spleen. Adrenals/urinary tract: Adrenal glands and kidneys are normal. Stomach/Bowel: Stomach and limited of the small bowel  is unremarkable Vascular/Lymphatic: Abdominal aortic normal caliber. No retroperitoneal periportal lymphadenopathy. Musculoskeletal: No aggressive osseous lesion IMPRESSION: 1. Acute pancreatitis. No evidence of pancreatic necrosis. 2. No organized fluid collections. 3. Cholelithiasis without evidence of acute cholecystitis. 4. No choledocholithiasis. No biliary duct dilatation. Electronically Signed   By: Genevive Bi M.D.   On: 11/12/2023 09:04   DG CHEST PORT 1 VIEW Result Date: 11/11/2023 CLINICAL DATA:  141880 SOB (shortness of breath) [id number] Abdominal pain [id number] EXAM: PORTABLE CHEST and abdomen 1 VIEW COMPARISON:  CT  chest 02/25/2023 FINDINGS: The heart and mediastinal contours are within normal limits. Aortic valve replacement. Atherosclerotic plaque. No focal consolidation. Chronic coarsened interstitial markings. No definite pulmonary edema. Bilateral trace pleural effusion. No pneumothorax. Nonobstructive bowel gas pattern. Stool noted within the ascending, transverse, descending colon. No acute osseous abnormality. IMPRESSION: 1. Likely bilateral trace pleural effusions. 2. Nonobstructive bowel gas pattern. 3.  Aortic Atherosclerosis (ICD10-I70.0). Electronically Signed   By: Tish Frederickson M.D.   On: 11/11/2023 18:14   DG Abd 1 View Result Date: 11/11/2023 CLINICAL DATA:  141880 SOB (shortness of breath) [id number] Abdominal pain [id number] EXAM: PORTABLE CHEST and abdomen 1 VIEW COMPARISON:  CT chest 02/25/2023 FINDINGS: The heart and mediastinal contours are within normal limits. Aortic valve replacement. Atherosclerotic plaque. No focal consolidation. Chronic coarsened interstitial markings. No definite pulmonary edema. Bilateral trace pleural effusion. No pneumothorax. Nonobstructive bowel gas pattern. Stool noted within the ascending, transverse, descending colon. No acute osseous abnormality. IMPRESSION: 1. Likely bilateral trace pleural effusions. 2. Nonobstructive bowel gas  pattern. 3.  Aortic Atherosclerosis (ICD10-I70.0). Electronically Signed   By: Tish Frederickson M.D.   On: 11/11/2023 18:14   MR ABDOMEN MRCP WO CONTRAST Result Date: 11/11/2023 CLINICAL DATA:  Pancreatitis.  Acute severe pancreatitis. EXAM: MRI ABDOMEN WITHOUT CONTRAST  (INCLUDING MRCP) TECHNIQUE: Multiplanar multisequence MR imaging of the abdomen was performed. Heavily T2-weighted images of the biliary and pancreatic ducts were obtained, and three-dimensional MRCP images were rendered by post processing. COMPARISON:  CT 11/10/2023 FINDINGS: Lower chest:  Lung bases are clear. Hepatobiliary: 6 mm gallstone towards the neck of the gallbladder (image 22/series 2). No gallbladder distension. No additional gallstones are evident. The common bile duct is normal caliber. No choledocholithiasis. Normal liver parenchyma. Benign cyst in the posterior RIGHT hepatic lobe. No biliary duct dilatation. Pancreas: There is mild retroperitoneal fluid stranding along the body of the pancreas extending into the anterior LEFT and RIGHT pararenal fascia. Small amount fluid extends along the LEFT pericolic gutter inferior to the spleen. No organized fluid collections. Postcontrast T1 weighted imaging is mildly degraded by respiratory motion. Pancreas is mildly edematous. No evidence of pancreatic necrosis. The pancreatic duct is normal caliber. No evidence of ductal interruption. Spleen: Normal spleen. Adrenals/urinary tract: Adrenal glands and kidneys are normal. Stomach/Bowel: Stomach and limited of the small bowel is unremarkable Vascular/Lymphatic: Abdominal aortic normal caliber. No retroperitoneal periportal lymphadenopathy. Musculoskeletal: No aggressive osseous lesion IMPRESSION: 1. Acute pancreatitis. No evidence of pancreatic necrosis. 2. No organized fluid collections. 3. Cholelithiasis without evidence of acute cholecystitis. 4. No choledocholithiasis. No biliary duct dilatation. Electronically Signed   By: Genevive Bi M.D.   On: 11/11/2023 16:02   Scheduled Meds:  atorvastatin  20 mg Oral Daily   Chlorhexidine Gluconate Cloth  6 each Topical Daily   cilostazol  100 mg Oral Daily   enoxaparin (LOVENOX) injection  40 mg Subcutaneous Daily   fluticasone furoate-vilanterol  1 puff Inhalation Daily   [START ON 11/14/2023] gabapentin  100 mg Oral BID   gabapentin  300 mg Oral BID   phosphorus  500 mg Oral Once   sodium chloride flush  3 mL Intravenous Q12H   tamsulosin  0.8 mg Oral QPM   Warfarin - Pharmacist Dosing Inpatient   Does not apply q1600   Continuous Infusions:  sodium chloride      LOS: 5 days   Marguerita Merles, DO [facility] Available via Epic secure chat 7am-7pm After these hours, please refer to coverage provider listed on [email or web address]  11/13/2023, 10:45 AM

## 2023-11-13 NOTE — Progress Notes (Addendum)
CT chest reassuring that the previously seen endobronchial lesion was benign. No follow up CT required for this. Surveillance of aortic aneurysm per Cardiology recommendations.   Steffanie Dunn, DO 11/13/23 7:20 AM [facility]  For contact information, see Amion. If no response to pager, please call PCCM consult pager. After hours, 7PM- 7AM, please call Elink.

## 2023-11-13 NOTE — Evaluation (Signed)
Physical Therapy Evaluation Patient Details Name: Tony Zhang MRN: [id number] DOB: Jan 07, 1946 Today's Date: 11/13/2023  History of Present Illness  Pt is 78 yo presenting to [facility] 1/21 for scheduled surgery. 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 Hospital stay complicated by acute pancreatitis, urinary retention and symptomatic hypotension. PMH includes: CAD (NSTEMI with stents placed 8/23), HFrEF, PAD, DVT/PE, HTN, COPD, CKD, neuropathy, and gout.  Clinical Impression  Pt is presenting close to baseline level of functioning. Pt is demonstrating poor balance and requires UE support to prevent falls during functional activities. Mod I for bed mobility and supervision for sit to stand and gait with UE support on IV pole. Dicussed using an AD for balance while at home in order to prevent falls; pt states understanding and agreement. Due to pt current functional status, home set up and available assistance at home recommending skilled physical therapy services 3x/week in order to address strength, balance and functional mobility to decrease risk for falls, injury and re-hospitalization.         If plan is discharge home, recommend the following: A little help with walking and/or transfers;Assist for transportation;Assistance with cooking/housework;Help with stairs or ramp for entrance     Equipment Recommendations None recommended by PT     Functional Status Assessment Patient has had a recent decline in their functional status and demonstrates the ability to make significant improvements in function in a reasonable and predictable amount of time.     Precautions / Restrictions Precautions Precautions: Fall Restrictions Weight Bearing Restrictions Per Provider Order: No      Mobility  Bed Mobility Overal bed mobility: Modified Independent      Transfers Overall transfer level: Needs assistance Equipment used: Rolling walker (2 wheels) Transfers: Sit to/from Stand Sit to Stand: Supervision      General transfer comment: Pt is unsteady on standing. Using IV pole for balance.    Ambulation/Gait Ambulation/Gait assistance: Supervision Gait Distance (Feet): 40 Feet Assistive device: IV Pole Gait Pattern/deviations: Step-through pattern, Decreased step length - right, Decreased step length - left Gait velocity: decreased Gait velocity interpretation: <1.31 ft/sec, indicative of household ambulator   General Gait Details: pt is unsteady with gait. Need UE support for balance. States he furniture walks at home. Discussed trying to use AD to prevent falls.     Balance Overall balance assessment: Needs assistance Sitting-balance support: Bilateral upper extremity supported Sitting balance-Leahy Scale: Normal     Standing balance support: Single extremity supported, Bilateral upper extremity supported, Reliant on assistive device for balance, During functional activity Standing balance-Leahy Scale: Poor Standing balance comment: Pt has poor balance in standing and requires UE support during functional dynamic activities in order to decrease risk for falls.           Pertinent Vitals/Pain Pain Assessment Pain Assessment: 0-10 Pain Score: 5  Pain Location: abdomen Pain Descriptors / Indicators: Aching Pain Intervention(s): Monitored during session    Home Living Family/patient expects to be discharged to:: Private residence Living Arrangements: Alone Available Help at Discharge: Friend(s);Available PRN/intermittently Type of Home: Mobile home Home Access: Stairs to enter Entrance Stairs-Rails: Right;Left Entrance Stairs-Number of Steps: 4   Home Layout: One level Home Equipment: Agricultural consultant (2 wheels);Rollator (4 wheels);Cane - single point;Shower seat;BSC/3in1 Additional Comments: pt reports his friend can come by as needed to assist, cannot provide physical assist    Prior Function Prior Level of Function : Independent/Modified  Independent;Working/employed;Driving   Mobility Comments: pt reports furniture walking in his home, does  have recent falls but is usually able to get himself up ADLs Comments: pt reports independent, typically does not need to use shower seat. Sleeps on bed or couch     Extremity/Trunk Assessment   Upper Extremity Assessment Upper Extremity Assessment: Generalized weakness    Lower Extremity Assessment Lower Extremity Assessment: Generalized weakness    Cervical / Trunk Assessment Cervical / Trunk Assessment: Normal  Communication   Communication Communication: No apparent difficulties Cueing Techniques: Verbal cues  Cognition Arousal: Alert Behavior During Therapy: WFL for tasks assessed/performed Overall Cognitive Status: Within Functional Limits for tasks assessed        General Comments General comments (skin integrity, edema, etc.): initially pt BP very low when pt sleeping in room. Pt awake and BP 111/51, After gait 106/66 BP. Pt denied lightheadedness/dizziness.        Assessment/Plan    PT Assessment Patient needs continued PT services  PT Problem List Decreased balance;Decreased mobility;Decreased activity tolerance       PT Treatment Interventions DME instruction;Therapeutic activities;Gait training;Therapeutic exercise;Stair training;Balance training;Functional mobility training;Patient/family education    PT Goals (Current goals can be found in the Care Plan section)  Acute Rehab PT Goals Patient Stated Goal: to improve mobility and go home. PT Goal Formulation: With patient Time For Goal Achievement: 11/27/23 Potential to Achieve Goals: Fair    Frequency Min 1X/week        AM-PAC PT "6 Clicks" Mobility  Outcome Measure Help needed turning from your back to your side while in a flat bed without using bedrails?: None Help needed moving from lying on your back to sitting on the side of a flat bed without using bedrails?: None Help needed moving to  and from a bed to a chair (including a wheelchair)?: A Little Help needed standing up from a chair using your arms (e.g., wheelchair or bedside chair)?: A Little Help needed to walk in hospital room?: A Little Help needed climbing 3-5 steps with a railing? : A Little 6 Click Score: 20    End of Session Equipment Utilized During Treatment: Gait belt Activity Tolerance: Patient tolerated treatment well Patient left: in bed;with call bell/phone within reach Nurse Communication: Mobility status PT Visit Diagnosis: Other abnormalities of gait and mobility (R26.89);Unsteadiness on feet (R26.81)    Time: 1017-1040 PT Time Calculation (min) (ACUTE ONLY): 23 min   Charges:   PT Evaluation $PT Eval Low Complexity: 1 Low PT Treatments $Therapeutic Activity: 8-22 mins PT General Charges $$ ACUTE PT VISIT: 1 Visit         Harrel Carina, DPT, CLT  Acute Rehabilitation Services Office: [phone] (Secure chat preferred)   Claudia Desanctis 11/13/2023, 5:13 PM

## 2023-11-14 ENCOUNTER — Inpatient Hospital Stay (HOSPITAL_COMMUNITY): Payer: Medicare Other

## 2023-11-14 ENCOUNTER — Other Ambulatory Visit: Payer: Self-pay

## 2023-11-14 DIAGNOSIS — Z952 Presence of prosthetic heart valve: Secondary | ICD-10-CM | POA: Diagnosis not present

## 2023-11-14 DIAGNOSIS — N179 Acute kidney failure, unspecified: Secondary | ICD-10-CM | POA: Diagnosis not present

## 2023-11-14 DIAGNOSIS — K859 Acute pancreatitis without necrosis or infection, unspecified: Secondary | ICD-10-CM | POA: Diagnosis not present

## 2023-11-14 LAB — HEPARIN LEVEL (UNFRACTIONATED)
Heparin Unfractionated: 0.19 [IU]/mL — ABNORMAL LOW (ref 0.30–0.70)
Heparin Unfractionated: 0.17 [IU]/mL — ABNORMAL LOW (ref 0.30–0.70)
Heparin Unfractionated: 0.23 [IU]/mL — ABNORMAL LOW (ref 0.30–0.70)

## 2023-11-14 LAB — GLUCOSE, CAPILLARY
Glucose-Capillary: 105 mg/dL — ABNORMAL HIGH (ref 70–99)
Glucose-Capillary: 106 mg/dL — ABNORMAL HIGH (ref 70–99)
Glucose-Capillary: 111 mg/dL — ABNORMAL HIGH (ref 70–99)
Glucose-Capillary: 152 mg/dL — ABNORMAL HIGH (ref 70–99)
Glucose-Capillary: 75 mg/dL (ref 70–99)
Glucose-Capillary: 87 mg/dL (ref 70–99)
Glucose-Capillary: 96 mg/dL (ref 70–99)

## 2023-11-14 LAB — CBC WITH DIFFERENTIAL/PLATELET
Abs Immature Granulocytes: 0.05 10*3/uL (ref 0.00–0.07)
Basophils Absolute: 0 10*3/uL (ref 0.0–0.1)
Basophils Relative: 0 %
Eosinophils Absolute: 0.2 10*3/uL (ref 0.0–0.5)
Eosinophils Relative: 3 %
HCT: 38.7 % — ABNORMAL LOW (ref 39.0–52.0)
Hemoglobin: 12.6 g/dL — ABNORMAL LOW (ref 13.0–17.0)
Immature Granulocytes: 1 %
Lymphocytes Relative: 5 %
Lymphs Abs: 0.4 10*3/uL — ABNORMAL LOW (ref 0.7–4.0)
MCH: 31 pg (ref 26.0–34.0)
MCHC: 32.6 g/dL (ref 30.0–36.0)
MCV: 95.3 fL (ref 80.0–100.0)
Monocytes Absolute: 0.6 10*3/uL (ref 0.1–1.0)
Monocytes Relative: 9 %
Neutro Abs: 5.9 10*3/uL (ref 1.7–7.7)
Neutrophils Relative %: 82 %
Platelets: 187 10*3/uL (ref 150–400)
RBC: 4.06 MIL/uL — ABNORMAL LOW (ref 4.22–5.81)
RDW: 15.7 % — ABNORMAL HIGH (ref 11.5–15.5)
WBC: 7.2 10*3/uL (ref 4.0–10.5)
nRBC: 0 % (ref 0.0–0.2)

## 2023-11-14 LAB — COMPREHENSIVE METABOLIC PANEL
ALT: 13 U/L (ref 0–44)
AST: 25 U/L (ref 15–41)
Albumin: 2.3 g/dL — ABNORMAL LOW (ref 3.5–5.0)
Alkaline Phosphatase: 51 U/L (ref 38–126)
Anion gap: 9 (ref 5–15)
BUN: 23 mg/dL (ref 8–23)
CO2: 21 mmol/L — ABNORMAL LOW (ref 22–32)
Calcium: 8.3 mg/dL — ABNORMAL LOW (ref 8.9–10.3)
Chloride: 106 mmol/L (ref 98–111)
Creatinine, Ser: 1.55 mg/dL — ABNORMAL HIGH (ref 0.61–1.24)
GFR, Estimated: 46 mL/min — ABNORMAL LOW (ref >60)
Glucose, Bld: 112 mg/dL — ABNORMAL HIGH (ref 70–99)
Potassium: 4.4 mmol/L (ref 3.5–5.1)
Sodium: 136 mmol/L (ref 135–145)
Total Bilirubin: 0.8 mg/dL (ref 0.0–1.2)
Total Protein: 5.7 g/dL — ABNORMAL LOW (ref 6.5–8.1)

## 2023-11-14 LAB — PROTIME-INR
INR: 1.7 — ABNORMAL HIGH (ref 0.8–1.2)
Prothrombin Time: 20.2 s — ABNORMAL HIGH (ref 11.4–15.2)

## 2023-11-14 LAB — MAGNESIUM: Magnesium: 2.2 mg/dL (ref 1.7–2.4)

## 2023-11-14 LAB — PHOSPHORUS: Phosphorus: 2.8 mg/dL (ref 2.5–4.6)

## 2023-11-14 LAB — LIPASE, BLOOD: Lipase: 71 U/L — ABNORMAL HIGH (ref 11–51)

## 2023-11-14 MED ORDER — GUAIFENESIN ER 600 MG PO TB12
1200.0000 mg | ORAL_TABLET | Freq: Two times a day (BID) | ORAL | Status: DC
  Administered 2023-11-14 – 2023-11-16 (×5): 1200 mg via ORAL
  Filled 2023-11-14 (×5): qty 2

## 2023-11-14 MED ORDER — IPRATROPIUM BROMIDE 0.02 % IN SOLN
0.5000 mg | Freq: Four times a day (QID) | RESPIRATORY_TRACT | Status: DC
  Administered 2023-11-14 – 2023-11-15 (×2): 0.5 mg via RESPIRATORY_TRACT
  Filled 2023-11-14 (×3): qty 2.5

## 2023-11-14 MED ORDER — LEVALBUTEROL HCL 0.63 MG/3ML IN NEBU
0.6300 mg | INHALATION_SOLUTION | Freq: Four times a day (QID) | RESPIRATORY_TRACT | Status: DC
  Administered 2023-11-14 – 2023-11-15 (×2): 0.63 mg via RESPIRATORY_TRACT
  Filled 2023-11-14 (×3): qty 3

## 2023-11-14 NOTE — Care Management Important Message (Signed)
Important Message  Patient Details  Name: Tony Zhang MRN: [id number] Date of Birth: Aug 13, 1946   Important Message Given:  Yes - Medicare IM     Renie Ora 11/14/2023, 10:38 AM

## 2023-11-14 NOTE — Progress Notes (Signed)
PROGRESS NOTE    Tony Zhang  GMW:[id number] DOB: 1946/02/23 DOA: 11/08/2023 PCP: System, Provider Not In   Brief Narrative:  Tony Zhang is a 78 y.o. male with past medical history of hypertension, dyslipidemia, CAD, severe aortic stenosis, PVD, history of recurrent venous thrombosis, presented for transcatheter aortic valve replacement.  Following the procedure performed on 1/21 patient reported developing severe abdominal pain in the upper region, central and radiating to the left side, two days after surgery. The pain is persistent and does not move. Accompanying the pain is a feeling of nausea, for which medication was administered. However, the medication seems to induce excessive sleepiness. The patient denies any history of alcohol use.   There is no prior history of pancreatitis, a condition that the recent CT scan suggests. The patient also reports a decrease in appetite, consuming minimal food. Pain management is currently through Tylenol, as other pain medications have previously caused stomach upset. The patient's last bowel movement was least 2 days ago.  Labs today note creatinine 2.56 with BUN 37.  Creatinine was noted to be 1.61 with BUN 27 yesterday.  Records note patient had been given Lasix 40 mg IV and was noted to be transiently hypotensive.   With his progressive decline throughout the day on 11/11/23 the primary team transferred to the ICU and consulted critical care.  He will be continued IV fluid hydration and will continue supportive care.  He was stablized and given IVF and now doing better  TRH now resuming medical care again on 11/13/23. Pancreatitis is still persistent and surgery has been consulted and will consider doing a cholecystectomy during hospitalization but will make the patient n.p.o. given his abdominal discomfort and pain.  If he is not improving the plan is to repeat his LFTs and lipase in the morning as well as possibly repeating his imaging to evaluate  for pancreatic necrosis or pseudocyst.  Assessment and Plan:  Acute Pancreatitis -CT Abd/Pelvis done and showed " Acute Pancreatitis. Secondary inflammation of the duodenum and trace free fluid. Hyperdense urine within the urinary bladder. Nonobstructed kidneys. Correlate with urinalysis. Otherwise stable noncontrast CT appearance of the abdomen and pelvis. 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 Cholelithiasis. Aortic Atherosclerosis" -RUQ U/S done and showed "Limited examination with overlapping bowel gas and soft tissue. Poor visualization of the pancreas, common duct. Dilated gallbladder without obvious stones. There was a stone in the gallbladder on CT." -Lipase Level Improving and now likely Plateaued; Lipase Level Trend: Recent Labs  Lab 11/10/23 1236 11/11/23 0325 11/12/23 0320 11/13/23 0355 11/14/23 0300  LIPASE 666* 205* 72* 69* 71*  -IVF now stopped by Primary  -Supportive Care and c/w Antiemetics with Ondansetron 4 mg q6hprn Nausea and Vomiting  -Checked MRI/MRCP to evaluate for choledocholithiasis and showed "Acute pancreatitis. No evidence of pancreatic necrosis. No organized fluid collections. Cholelithiasis without evidence of acute cholecystitis. No choledocholithiasis. No biliary duct dilatation." -Clear liquid diet advanced to FULL yesterday but will go to NPO now given continued Abdominal Pain -WBC Trend: Recent Labs  Lab 11/04/23 0807 11/09/23 0309 11/10/23 0308 11/11/23 0329 11/12/23 0320 11/13/23 0355 11/14/23 0300  WBC 8.8 9.4 14.8* 15.0* 10.7* 8.8 7.2  -CXR and KUB done and showed "Likely bilateral trace pleural effusions. Nonobstructive bowel gas pattern.  Aortic Atherosclerosis" -Continue Slow diet advancement and Fluids as needed.  -C/w Pain Control with Acetaminophen 1000 mg po q6hprn Mild Pain, Oxycodone IR 5 mg po q4hprn Moderate Pain, and IV Hydromorphone 0.5 mg  q4hprn Break Through Pain -Discussed with General Surgery and he is  still very tender and patient is symptomatic.  They are going to see how the patient progresses and determine if he is appropriate for surgery during this hospitalization; plan is for repeat labs including LFTs and lipase in a.m. if he is not improving may be a repeat CT scan abdomen pelvis to be necessary to ensure the patient is not developing pancreatic necrosis or pseudocyst -If he is not eating very much may need to give him postpyloric tube feedings and place Cortrak -No Need for Abx as patient does not have Pancreatic Necrosis and because SIRS is improved   Lethargy, improved -Transferred to ICU per Primary on 11/11/23 and now being transferred to the Floor -Appreciated PCCM evaluation  Acute Respiratory Failure with Hypoxia SpO2: 95 % O2 Flow Rate (L/min): 5 L/min -CXR today showed " Chronic lung changes compatible with emphysema. Linear densities at the left lung base most likely associated with atelectasis." -CT Chest w/o Contrast showed "Previously seen endobronchial lesion is no longer identified consistent with transient mucous plugging. No new endobronchial lesion is seen. Stable atherosclerotic changes of the thoracic aorta with areas of mild dilatation in the ascending aorta and proximal aortic arch similar to that seen on the recent exam. Recommend semi-annual imaging followup by CTA or MRA and referral to cardiothoracic surgery if not already obtained. Aortic aneurysm. Aortic Atherosclerosis and Emphysema" -C/w Xopenex/Atrovent and Breo Ellipta  -Add Flutter Valve, Incentive Spirometry, and Guaifenesin 1200 mg po BID -Pulse oximetry maintain O2 saturation greater 90% -Continue supplemental oxygen via nasal cannula wean O2 as tolerated  Transient Hypotension, improved -In the setting of Hypovolemia after Furosemide and Metoprolol Administration as well as having poor Po Intake -Blood pressures noted to be as low as 90/31 yesterday.  Suspect secondary to fluid losses and was also  noted to be when he was  -IV fluids now discontinued by Primary; Patient responded to IVF Hydration an since BP is on the softer side still will give another 500 mL bolus. -Avoid blood pressure lowering medications at this time. -Transferred out of the unit but if necessary will need further IVF Hydration -Continue to Monitor BP per Protocol; Last BP reading was 146/38  Hx AoC HFpEF CAD s/p DES to LAD 2023 Severe PAD with known occlusion of the distal abdominal aorta, renal artery stenosis, celiac/mesenteric artery stenosis, and total RICA occulsion and 40-50% LICA occulusion -C/w liquid diet and advanced to FULL, although expected p.o. intake will be minimal -Was given a dose of IV Lasix 40 mg 1/22 -Maintenance IVF discontinued by Cardiology -Continue to hold Metoprolol, Jardiance -Continue Cilostazol 100 mg po Daily, Atorvastatin 20 mg po Daily, Repatha; No ASA given Warfarain  -Checked BNP level and was 255, although clinically he does not appear hypervolemic. -Per Cardiology Medical Management of CAD -C/w OOB and Ambulation and PT/OT to further evaluate and Treat  Acute Urinary Retention -S/p IVF and recommend TOV for Catheter when more mobile and recommend that be done soon -C/w Tamsulosin 0.8 mg po qHS  Severe Aortic Stenosis -Status post TAVR postoperative day 6 -Per primary and C/w Post-op Care Per Cardiology  -Anticoagulation with Warfarin has been resumed; Warfarin Dose per Pharmacy  Previous RLL filling defect in bronchus -Possibly mucus plugging. -Follow up abdominal CT at the same level (could be off by a few cm) is reassurring, but repeat dedicated chest imaging is appropriate to ensure this has resolved fully. H/o tobacco abuse. -CT chest  to reassess and Per PCCM was "reassuring that the previously seen endobronchial lesion was benign. No follow up CT required for this. 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  and stable for 1 year in the 2024 F/U -C/w Outpatient Monitoring and Follow Up  Hx of DVT -Anticoagulation with Coumadin resumed back to heparin drip given anticipated surgical intervention  Hypomagnesemia -Patient's Mag Level Trend: Recent Labs  Lab 11/09/23 0309 11/11/23 0325 11/12/23 0320 11/13/23 0355 11/14/23 0300  MG 1.6* 1.5* 1.4* 1.6* 2.2  -Replete with IV Mag Sulfate 2 grams yesterday -Continue to Monitor and Replete as Necessary -Repeat Mag in the AM   Hypophosphatemia -Phos Level Trend: Recent Labs  Lab 11/11/23 0325 11/12/23 0320 11/13/23 0355 11/14/23 0300  PHOS 4.2 3.0 2.4* 2.8  -Replete with po K Phos Neutral 500 mg x1 yesterday -Continue to Monitor and Replete as Necessary -Repeat Phos Level in the AM  AKI on CKD Stage 3b, improving and stable Metabolic Acidosis, mild -BUN/Cr Trend: Recent Labs  Lab 11/09/23 0309 11/10/23 0308 11/11/23 0329 11/11/23 1336 11/12/23 0320 11/13/23 0355 11/14/23 0300  BUN 24* 37* 52* 53* 41* 29* 23  CREATININE 1.61* 2.56* 3.84* 3.16* 2.17* 1.54* 1.55*  -Metabolic Acidosis improving; Has a Slight Metabolic Acidosis still with a CO2 of 21, anion gap of 9, chloride level 106 -Avoid Nephrotoxic Medications, Contrast Dyes, Hypotension and Dehydration to Ensure Adequate Renal Perfusion and will need to Renally Adjust Meds -C.w Strict I's and O's and Monitoring of UOP -Continue to Monitor and Trend Renal Function carefully and repeat CMP in the AM   Hyperbilirubinemia, improved  -T Bili Trend: Recent Labs  Lab 11/04/23 0807 11/10/23 1236 11/11/23 0325 11/11/23 1336 11/12/23 0320 11/13/23 0355 11/14/23 0300  BILITOT 1.1 1.8* 1.5* 1.2 1.1 1.1  1.0 0.8  -Continue to Monitor and Trend and Repeat CMP in the AM  Hypoalbuminemia -Patient's Albumin Trend: Recent Labs  Lab 11/04/23 0807 11/10/23 1236 11/11/23 0325 11/11/23 1336 11/12/23 0320 11/13/23 0355 11/14/23 0300  ALBUMIN 4.1 3.5 2.7* 2.5* 2.3* 2.3*   2.3* 2.3*  -Continue to Monitor and Trend and repeat CMP in the AM   DVT prophylaxis: SCDs Start: 11/08/23 1647    Code Status: Full Code Family Communication: No family at bedside  Disposition Plan:  Level of care: Telemetry Cardiac Status is: Inpatient Remains inpatient appropriate because: Needs further clinical improvement   Consultants:  Cardiology Primary PCCM [facility] General Surgery  Procedures:  As delineated as above  Antimicrobials:  Anti-infectives (From admission, onward)    Start     Dose/Rate Route Frequency Ordered Stop   11/08/23 2100  ceFAZolin (ANCEF) IVPB 2g/100 mL premix        2 g 200 mL/hr over 30 Minutes Intravenous Every 8 hours 11/08/23 1647 11/09/23 0548   11/08/23 0400  ceFAZolin (ANCEF) IVPB 2g/100 mL premix  Status:  Discontinued        2 g 200 mL/hr over 30 Minutes Intravenous To Surgery 11/07/23 1323 11/08/23 1514       Subjective: Seen and examined at bedside and continues to have significant abdominal discomfort and pain.  Denies chest pain or shortness breath but is wearing supplemental oxygen via nasal cannula.  States that he does not feel as well and has not really been tolerating his liquid diet.  Objective: Vitals:   11/14/23 0828 11/14/23 0843 11/14/23 1210 11/14/23 1544  BP: (!) 145/54  (!) 146/38 (!) 140/55  Pulse:  88 100  Resp: 18 16 20 20   Temp: 98.1 F (36.7 C)  98.3 F (36.8 C) 97.8 F (36.6 C)  TempSrc: Oral  Oral Oral  SpO2:   95% 96%  Weight:      Height:        Intake/Output Summary (Last 24 hours) at 11/14/2023 1652 Last data filed at 11/14/2023 1549 Gross per 24 hour  Intake 808.41 ml  Output 1650 ml  Net -841.59 ml   Filed Weights   11/12/23 0500 11/13/23 0500 11/14/23 0610  Weight: 102.2 kg 101.7 kg 102.3 kg   Examination: Physical Exam:  Constitutional: WN/WD obese chronically ill-appearing Caucasian male in no acute distress Respiratory: Diminished to auscultation bilaterally with some coarse  breath sounds, no wheezing, rales, rhonchi or crackles. Normal respiratory effort and patient is not tachypenic. No accessory muscle use.  Wearing supplemental oxygen via nasal cannula Cardiovascular: RRR, no murmurs / rubs / gallops. S1 and S2 auscultated. No extremity edema.  Abdomen: Soft, tender to palpate and distended secondary to body habitus. Bowel sounds positive.  GU: Deferred. Musculoskeletal: No clubbing / cyanosis of digits/nails. No joint deformity upper and lower extremities.  Skin: No rashes, lesions, ulcers on limited skin evaluation. No induration; Warm and dry.  Neurologic: CN 2-12 grossly intact with no focal deficits.  Romberg sign and cerebellar reflexes not assessed.  Psychiatric: Normal judgment and insight. Alert and oriented x 3. Normal mood and appropriate affect.   Data Reviewed: I have personally reviewed following labs and imaging studies  CBC: Recent Labs  Lab 11/10/23 0308 11/11/23 0329 11/12/23 0320 11/13/23 0355 11/14/23 0300  WBC 14.8* 15.0* 10.7* 8.8 7.2  NEUTROABS  --   --   --  7.6 5.9  HGB 16.4 14.7 12.9* 13.1 12.6*  HCT 49.0 44.7 39.2 39.4 38.7*  MCV 94.0 95.9 95.4 94.7 95.3  PLT 243 183 164 177 187   Basic Metabolic Panel: Recent Labs  Lab 11/09/23 0309 11/10/23 0308 11/11/23 0325 11/11/23 0329 11/11/23 1336 11/12/23 0320 11/13/23 0355 11/14/23 0300  NA 132*   < >  --  135 132* 134* 135 136  K 4.3   < >  --  4.5 4.1 4.0 4.5 4.4  CL 101   < >  --  105 103 106 105 106  CO2 20*   < >  --  18* 16* 20* 21* 21*  GLUCOSE 117*   < >  --  67* 57* 70 83 112*  BUN 24*   < >  --  52* 53* 41* 29* 23  CREATININE 1.61*   < >  --  3.84* 3.16* 2.17* 1.54* 1.55*  CALCIUM 8.4*   < >  --  8.0* 7.7* 7.8* 8.1* 8.3*  MG 1.6*  --  1.5*  --   --  1.4* 1.6* 2.2  PHOS  --   --  4.2  --   --  3.0 2.4* 2.8   < > = values in this interval not displayed.   GFR: Estimated Creatinine Clearance: 49.4 mL/min (A) (by C-G formula based on SCr of 1.55 mg/dL  (H)). Liver Function Tests: Recent Labs  Lab 11/11/23 0325 11/11/23 1336 11/12/23 0320 11/13/23 0355 11/14/23 0300  AST 24 19 21  44*  37 25  ALT 6 7 10 11  10 13   ALKPHOS 35* 37* 40 50  47 51  BILITOT 1.5* 1.2 1.1 1.1  1.0 0.8  PROT 6.1* 5.9* 5.5* 5.8*  5.7* 5.7*  ALBUMIN  2.7* 2.5* 2.3* 2.3*  2.3* 2.3*   Recent Labs  Lab 11/10/23 1236 11/11/23 0325 11/12/23 0320 11/13/23 0355 11/14/23 0300  LIPASE 666* 205* 72* 69* 71*  AMYLASE 495*  --   --   --   --    No results for input(s): "AMMONIA" in the last 168 hours. Coagulation Profile: Recent Labs  Lab 11/10/23 1121 11/11/23 0329 11/12/23 0320 11/13/23 0355 11/14/23 0300  INR 1.1 1.2 1.4* 1.4* 1.7*   Cardiac Enzymes: No results for input(s): "CKTOTAL", "CKMB", "CKMBINDEX", "TROPONINI" in the last 168 hours. BNP (last 3 results) No results for input(s): "PROBNP" in the last 8760 hours. HbA1C: No results for input(s): "HGBA1C" in the last 72 hours. CBG: Recent Labs  Lab 11/14/23 0010 11/14/23 0527 11/14/23 1019 11/14/23 1212 11/14/23 1616  GLUCAP 152* 105* 111* 87 75   Lipid Profile: No results for input(s): "CHOL", "HDL", "LDLCALC", "TRIG", "CHOLHDL", "LDLDIRECT" in the last 72 hours. Thyroid Function Tests: No results for input(s): "TSH", "T4TOTAL", "FREET4", "T3FREE", "THYROIDAB" in the last 72 hours. Anemia Panel: No results for input(s): "VITAMINB12", "FOLATE", "FERRITIN", "TIBC", "IRON", "RETICCTPCT" in the last 72 hours. Sepsis Labs: Recent Labs  Lab 11/10/23 1003 11/10/23 1236  LATICACIDVEN 1.2 1.5   No results found for this or any previous visit (from the past 240 hours).   Radiology Studies: DG CHEST PORT 1 VIEW Result Date: 11/14/2023 CLINICAL DATA:  Shortness of breath. EXAM: PORTABLE CHEST 1 VIEW COMPARISON:  11/11/2023 FINDINGS: Chronic lung changes compatible with emphysema. Linear densities at the left lung base most likely associated with atelectasis. Heart size is within normal  limits and stable. Negative for a pneumothorax. IMPRESSION: 1. Chronic lung changes compatible with emphysema. 2. Linear densities at the left lung base most likely associated with atelectasis. Electronically Signed   By: Richarda Overlie M.D.   On: 11/14/2023 08:25   CT CHEST WO CONTRAST Result Date: 11/12/2023 CLINICAL DATA:  Follow-up right lower lobe endobronchial lesion EXAM: CT CHEST WITHOUT CONTRAST TECHNIQUE: Multidetector CT imaging of the chest was performed following the standard protocol without IV contrast. RADIATION DOSE REDUCTION: This exam was performed according to the departmental dose-optimization program which includes automated exposure control, adjustment of the mA and/or kV according to patient size and/or use of iterative reconstruction technique. COMPARISON:  10/10/2023 FINDINGS: Cardiovascular: Somewhat limited due to lack of IV contrast. Diffuse atherosclerotic calcifications of the thoracic aorta are noted. Stable dilatation of the proximal mid aortic arch is noted with tapering similar to that seen on the prior exam. Prominence of the ascending aorta to 4 cm is noted. Changes of prior TAVR are seen. Descending thoracic aorta appears within normal limits. No cardiac enlargement is noted. Heavy coronary calcifications are seen. The pulmonary artery is not significantly enlarged. Mediastinum/Nodes: Thoracic inlet is within normal limits. Scattered small likely reactive lymph nodes are noted throughout the mediastinum stable from the prior exam. The esophagus as visualized is within normal limits. Lungs/Pleura: Mild emphysematous changes are noted throughout both lungs. Mild scarring is noted in the lower lobes bilaterally stable from previous exam. The previously seen endobronchial filling defect in the right lower lobe bronchus is no longer identified and likely represented mucous plugging. No new endobronchial lesions are seen. Minimal nodularity is again noted stable from the prior exam as  well as dating back to an exam from 2023. Upper Abdomen: Visualized upper abdomen is unremarkable. Musculoskeletal: Degenerative changes of the thoracic spine are seen. No acute rib abnormality is noted. IMPRESSION: Previously  seen endobronchial lesion is no longer identified consistent with transient mucous plugging. No new endobronchial lesion is seen. Stable atherosclerotic changes of the thoracic aorta with areas of mild dilatation in the ascending aorta and proximal aortic arch similar to that seen on the recent exam. Recommend semi-annual imaging followup by CTA or MRA and referral to cardiothoracic surgery if not already obtained. This recommendation follows 2010 ACCF/AHA/AATS/ACR/ASA/SCA/SCAI/SIR/STS/SVM Guidelines for the Diagnosis and Management of Patients With Thoracic Aortic Disease. Circulation. 2010; 121: Z610-R60. Aortic aneurysm NOS (ICD10-I71.9) Aortic Atherosclerosis (ICD10-I70.0) and Emphysema (ICD10-J43.9). Electronically Signed   By: Alcide Clever M.D.   On: 11/12/2023 20:34   Scheduled Meds:  atorvastatin  20 mg Oral Daily   Chlorhexidine Gluconate Cloth  6 each Topical Daily   cilostazol  100 mg Oral Daily   fluticasone furoate-vilanterol  1 puff Inhalation Daily   gabapentin  100 mg Oral BID   gabapentin  300 mg Oral BID   guaiFENesin  1,200 mg Oral BID   ipratropium  0.5 mg Nebulization Q6H   levalbuterol  0.63 mg Nebulization Q6H   sodium chloride flush  3 mL Intravenous Q12H   tamsulosin  0.8 mg Oral QPM   Continuous Infusions:  heparin 2,000 Units/hr (11/14/23 1258)    LOS: 6 days   Marguerita Merles, DO [facility] Available via Epic secure chat 7am-7pm After these hours, please refer to coverage provider listed on [email or web address] 11/14/2023, 4:52 PM

## 2023-11-14 NOTE — Progress Notes (Signed)
6 Days Post-Op  Subjective: CC: Patient reports continued 9/10 epigastric abdominal pain with associated nausea that is worsened by p.o. intake.  Objective: Vital signs in last 24 hours: Temp:  [97.5 F (36.4 C)-98.9 F (37.2 C)] 98.3 F (36.8 C) (01/27 1210) Pulse Rate:  [80-113] 88 (01/27 1210) Resp:  [15-22] 20 (01/27 1210) BP: (101-156)/(38-72) 146/38 (01/27 1210) SpO2:  [90 %-99 %] 95 % (01/27 1210) Weight:  [102.3 kg] 102.3 kg (01/27 0610) Last BM Date : 11/07/23  Intake/Output from previous day: 01/26 0701 - 01/27 0700 In: 368.4 [P.O.:240; I.V.:128.4] Out: 1400 [Urine:1400] Intake/Output this shift: No intake/output data recorded.  PE: Gen:  Alert, NAD, pleasant Abd: Soft, at least mild distension, epigastric and RUQ ttp with some voluntary guarding  Lab Results:  Recent Labs    11/13/23 0355 11/14/23 0300  WBC 8.8 7.2  HGB 13.1 12.6*  HCT 39.4 38.7*  PLT 177 187   BMET Recent Labs    11/13/23 0355 11/14/23 0300  NA 135 136  K 4.5 4.4  CL 105 106  CO2 21* 21*  GLUCOSE 83 112*  BUN 29* 23  CREATININE 1.54* 1.55*  CALCIUM 8.1* 8.3*   PT/INR Recent Labs    11/13/23 0355 11/14/23 0300  LABPROT 17.8* 20.2*  INR 1.4* 1.7*   CMP     Component Value Date/Time   NA 136 11/14/2023 0300   NA 140 10/05/2023 1010   K 4.4 11/14/2023 0300   CL 106 11/14/2023 0300   CO2 21 (L) 11/14/2023 0300   GLUCOSE 112 (H) 11/14/2023 0300   BUN 23 11/14/2023 0300   BUN 24 10/05/2023 1010   CREATININE 1.55 (H) 11/14/2023 0300   CREATININE 1.56 (H) 09/17/2013 1135   CALCIUM 8.3 (L) 11/14/2023 0300   PROT 5.7 (L) 11/14/2023 0300   PROT 7.2 09/22/2023 0947   ALBUMIN 2.3 (L) 11/14/2023 0300   ALBUMIN 4.4 09/22/2023 0947   AST 25 11/14/2023 0300   ALT 13 11/14/2023 0300   ALKPHOS 51 11/14/2023 0300   BILITOT 0.8 11/14/2023 0300   BILITOT 0.7 09/22/2023 0947   GFRNONAA 46 (L) 11/14/2023 0300   GFRAA >60 08/14/2019 0429   Lipase     Component Value  Date/Time   LIPASE 71 (H) 11/14/2023 0300    Studies/Results: DG CHEST PORT 1 VIEW Result Date: 11/14/2023 CLINICAL DATA:  Shortness of breath. EXAM: PORTABLE CHEST 1 VIEW COMPARISON:  11/11/2023 FINDINGS: Chronic lung changes compatible with emphysema. Linear densities at the left lung base most likely associated with atelectasis. Heart size is within normal limits and stable. Negative for a pneumothorax. IMPRESSION: 1. Chronic lung changes compatible with emphysema. 2. Linear densities at the left lung base most likely associated with atelectasis. Electronically Signed   By: Richarda Overlie M.D.   On: 11/14/2023 08:25   CT CHEST WO CONTRAST Result Date: 11/12/2023 CLINICAL DATA:  Follow-up right lower lobe endobronchial lesion EXAM: CT CHEST WITHOUT CONTRAST TECHNIQUE: Multidetector CT imaging of the chest was performed following the standard protocol without IV contrast. RADIATION DOSE REDUCTION: This exam was performed according to the departmental dose-optimization program which includes automated exposure control, adjustment of the mA and/or kV according to patient size and/or use of iterative reconstruction technique. COMPARISON:  10/10/2023 FINDINGS: Cardiovascular: Somewhat limited due to lack of IV contrast. Diffuse atherosclerotic calcifications of the thoracic aorta are noted. Stable dilatation of the proximal mid aortic arch is noted with tapering similar to that seen on the prior exam.  Prominence of the ascending aorta to 4 cm is noted. Changes of prior TAVR are seen. Descending thoracic aorta appears within normal limits. No cardiac enlargement is noted. Heavy coronary calcifications are seen. The pulmonary artery is not significantly enlarged. Mediastinum/Nodes: Thoracic inlet is within normal limits. Scattered small likely reactive lymph nodes are noted throughout the mediastinum stable from the prior exam. The esophagus as visualized is within normal limits. Lungs/Pleura: Mild emphysematous  changes are noted throughout both lungs. Mild scarring is noted in the lower lobes bilaterally stable from previous exam. The previously seen endobronchial filling defect in the right lower lobe bronchus is no longer identified and likely represented mucous plugging. No new endobronchial lesions are seen. Minimal nodularity is again noted stable from the prior exam as well as dating back to an exam from 2023. Upper Abdomen: Visualized upper abdomen is unremarkable. Musculoskeletal: Degenerative changes of the thoracic spine are seen. No acute rib abnormality is noted. IMPRESSION: Previously seen endobronchial lesion is no longer identified consistent with transient mucous plugging. No new endobronchial lesion is seen. Stable atherosclerotic changes of the thoracic aorta with areas of mild dilatation in the ascending aorta and proximal aortic arch similar to that seen on the recent exam. Recommend semi-annual imaging followup by CTA or MRA and referral to cardiothoracic surgery if not already obtained. This recommendation follows 2010 ACCF/AHA/AATS/ACR/ASA/SCA/SCAI/SIR/STS/SVM Guidelines for the Diagnosis and Management of Patients With Thoracic Aortic Disease. Circulation. 2010; 121: N562-Z30. Aortic aneurysm NOS (ICD10-I71.9) Aortic Atherosclerosis (ICD10-I70.0) and Emphysema (ICD10-J43.9). Electronically Signed   By: Alcide Clever M.D.   On: 11/12/2023 20:34    Anti-infectives: Anti-infectives (From admission, onward)    Start     Dose/Rate Route Frequency Ordered Stop   11/08/23 2100  ceFAZolin (ANCEF) IVPB 2g/100 mL premix        2 g 200 mL/hr over 30 Minutes Intravenous Every 8 hours 11/08/23 1647 11/09/23 0548   11/08/23 0400  ceFAZolin (ANCEF) IVPB 2g/100 mL premix  Status:  Discontinued        2 g 200 mL/hr over 30 Minutes Intravenous To Surgery 11/07/23 1323 11/08/23 1514        Assessment/Plan Tony Zhang is an 78 y.o. male status post TAVR on 1/21 with subsequent development of  abdominal pain and workup concerning for acute pancreatitis secondary to gallstones.   - My attending has discussed cardiac risk with primary team, cardiology, given recent TAVR.  Although ideally the timeframe is 1 month post TAVR for elective surgery, given the patient is quite symptomatic, cardiology states low enough risk that can consider surgery during this hospitalization. We will see how patient progresses and determine if appropriate for surgery during hospitalization. - Hold Coumadin, okay for heparin drip - While lipase has overall down trended, he remains with significant epigastric abdominal pain that is worsened by p.o. intake.  There was no evidence of cholecystitis on his initial imaging and his WBC and LFTs are within normal limits.  Will make n.p.o.  Plan repeat labs including LFTs and lipase in the a.m.  If he is not improving we will likely need repeat imaging to ensure is not developing pancreatic necrosis or pseudocyst, neither of which were present on his imaging on 1/24. Okay for Post Pyloric TF's.  - We will follow with you  FEN - NPO, IVF per primary  VTE - SCDs, Heparin gtt ID - None currently.   I reviewed nursing notes, last 24 h vitals and pain scores, last 48 h intake  and output, last 24 h labs and trends, and last 24 h imaging results.   LOS: 6 days    Jacinto Halim , [facility] Surgery 11/14/2023, 12:28 PM Please see Amion for pager number during day hours 7:00am-4:30pm

## 2023-11-14 NOTE — Progress Notes (Signed)
PHARMACY - ANTICOAGULATION CONSULT NOTE  Pharmacy Consult for Warfarin > IV heparin Indication: VTE history  Allergies  Allergen Reactions   Codeine Hives and Other (See Comments)    Takes benadryl to stop allergic reactions    Patient Measurements: Height: 6\' 1"  (185.4 cm) Weight: 102.3 kg (225 lb 8 oz) IBW/kg (Calculated) : 79.9 Heparin dosing weight = 100 kg  Vital Signs: Temp: 98.3 F (36.8 C) (01/27 1210) Temp Source: Oral (01/27 1210) BP: 146/38 (01/27 1210) Pulse Rate: 88 (01/27 1210)  Labs: Recent Labs    11/12/23 0320 11/13/23 0355 11/14/23 0300 11/14/23 1156  HGB 12.9* 13.1 12.6*  --   HCT 39.2 39.4 38.7*  --   PLT 164 177 187  --   LABPROT 17.5* 17.8* 20.2*  --   INR 1.4* 1.4* 1.7*  --   HEPARINUNFRC  --   --  0.19* 0.17*  CREATININE 2.17* 1.54* 1.55*  --     Estimated Creatinine Clearance: 49.4 mL/min (A) (by C-G formula based on SCr of 1.55 mg/dL (H)).   Medical History: Past Medical History:  Diagnosis Date   AAA (abdominal aortic aneurysm) (HCC)    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     CAD (coronary artery disease)    COPD (chronic obstructive pulmonary disease) (HCC)    DJD (degenerative joint disease)    Dyslipidemia    Gout    Malignant hypertension    Peripheral vascular disease (HCC)    Pneumonia 08/18/2013   pt. 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 Excell Seltzer and Dr. Laneta Simmers   Severe aortic stenosis    Venous thrombosis    Recurrent    Assessment: 78 year old male s/p TAVR 1/21, on warfarin PTA for hx recurrent VTE. -PTA warfarin regimen - 4 mg Sat / Sun, 2 mg other days. Now on heparin and warfarin on hold. INR 1.7.   Heparin level sub-therapeutic at 0.17  on 1750 units/hour.  H/H slightly down at 12.6/187. Platelets within normal limits.  No bleeding noted.   Goal of Therapy:  Heparin level 0.3-0.7 units/ml Monitor platelets by anticoagulation protocol: Yes   Plan:  Increase Heparin to 2000 units/hr Heparin level in 8 hours.  Daily heparin level and CBC.    Link Snuffer, PharmD, BCPS, BCCCP Please refer to [facility] for [facility] Pharmacy numbers

## 2023-11-14 NOTE — Progress Notes (Signed)
Mobility Specialist Progress Note:    11/14/23 1524  Mobility  Activity Ambulated with assistance in room  Level of Assistance Contact guard assist, steadying assist  Assistive Device Front wheel walker  Distance Ambulated (ft) 24 ft  Activity Response Tolerated well  Mobility Referral Yes  Mobility visit 1 Mobility  Mobility Specialist Start Time (ACUTE ONLY) 1515  Mobility Specialist Stop Time (ACUTE ONLY) 1524  Mobility Specialist Time Calculation (min) (ACUTE ONLY) 9 min   Pt received in bed, agreeable to mobility session. Ambulated to door and back to bed. Took 2 standing rest breaks, SpO2 92% on 6L while ambulating. Tolerated well, slight audible SOB but denies dizziness and pain. Returned pt to bed, sitting EOB with all needs met, call bell in reach.   [name] Mobility Specialist Please contact via Special educational needs teacher or  Rehab office at [phone]

## 2023-11-14 NOTE — Progress Notes (Signed)
PHARMACY - ANTICOAGULATION CONSULT NOTE  Pharmacy Consult for Warfarin > IV heparin Indication: VTE history  Allergies  Allergen Reactions   Codeine Hives and Other (See Comments)    Takes benadryl to stop allergic reactions    Patient Measurements: Height: 6\' 1"  (185.4 cm) Weight: 102.3 kg (225 lb 8 oz) IBW/kg (Calculated) : 79.9 Heparin dosing weight = 100 kg  Vital Signs: Temp: 98.6 F (37 C) (01/27 2027) Temp Source: Oral (01/27 2027) BP: 108/73 (01/27 2027) Pulse Rate: 109 (01/27 2027)  Labs: Recent Labs    11/12/23 0320 11/13/23 0355 11/14/23 0300 11/14/23 1156 11/14/23 2052  HGB 12.9* 13.1 12.6*  --   --   HCT 39.2 39.4 38.7*  --   --   PLT 164 177 187  --   --   LABPROT 17.5* 17.8* 20.2*  --   --   INR 1.4* 1.4* 1.7*  --   --   HEPARINUNFRC  --   --  0.19* 0.17* 0.23*  CREATININE 2.17* 1.54* 1.55*  --   --     Estimated Creatinine Clearance: 49.4 mL/min (A) (by C-G formula based on SCr of 1.55 mg/dL (H)).   Medical History: Past Medical History:  Diagnosis Date   AAA (abdominal aortic aneurysm) (HCC)    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     CAD (coronary artery disease)    COPD (chronic obstructive pulmonary disease) (HCC)    DJD (degenerative joint disease)    Dyslipidemia    Gout    Malignant hypertension    Peripheral vascular disease (HCC)    Pneumonia 08/18/2013   pt. 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 Excell Seltzer and Dr. Laneta Simmers   Severe aortic stenosis    Venous thrombosis    Recurrent    Assessment: 78 year old male s/p TAVR 1/21, on warfarin PTA for hx recurrent VTE.  -PTA warfarin regimen - 4 mg Sat / Sun, 2 mg other days. Now on heparin and warfarin on hold.     Heparin level 0.23 is subtherapeutic on 2000 units/hr.  No issues with infusion or bleeding per RN.   Goal of Therapy:  Heparin level 0.3-0.7 units/ml Monitor platelets by anticoagulation protocol: Yes   Plan:  Increase Heparin to 2200 units/hr Monitor daily heparin level, CBC, signs/symptoms of bleeding   Alphia Moh, PharmD, BCPS, [facility] Clinical Pharmacist  Please check AMION for all [facility] Pharmacy phone numbers After 10:00 PM, call Main Pharmacy [phone]

## 2023-11-14 NOTE — Progress Notes (Signed)
PHARMACY - ANTICOAGULATION CONSULT NOTE  Pharmacy Consult for Warfarin > IV heparin Indication: VTE history  Allergies  Allergen Reactions   Codeine Hives and Other (See Comments)    Takes benadryl to stop allergic reactions    Patient Measurements: Height: 6\' 1"  (185.4 cm) Weight: 101.7 kg (224 lb 3.3 oz) IBW/kg (Calculated) : 79.9 Heparin dosing weight = 100 kg  Vital Signs: Temp: 97.9 F (36.6 C) (01/27 0351) Temp Source: Oral (01/27 0351) BP: 101/61 (01/27 0351) Pulse Rate: 84 (01/27 0351)  Labs: Recent Labs    11/12/23 0320 11/13/23 0355 11/14/23 0300  HGB 12.9* 13.1 12.6*  HCT 39.2 39.4 38.7*  PLT 164 177 187  LABPROT 17.5* 17.8* 20.2*  INR 1.4* 1.4* 1.7*  HEPARINUNFRC  --   --  0.19*  CREATININE 2.17* 1.54* 1.55*    Estimated Creatinine Clearance: 49.2 mL/min (A) (by C-G formula based on SCr of 1.55 mg/dL (H)).   Medical History: Past Medical History:  Diagnosis Date   AAA (abdominal aortic aneurysm) (HCC)    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     CAD (coronary artery disease)    COPD (chronic obstructive pulmonary disease) (HCC)    DJD (degenerative joint disease)    Dyslipidemia    Gout    Malignant hypertension    Peripheral vascular disease (HCC)    Pneumonia 08/18/2013   pt. 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 Excell Seltzer and Dr. Laneta Simmers   Severe aortic stenosis    Venous thrombosis    Recurrent    Assessment: 78 year old male s/p TAVR 1/21, on warfarin PTA for hx recurrent VTE. -PTA warfarin regimen - 4 mg Sat / Sun, 2 mg other days  Now on heparin and warfarin on hold  1/27 AM update:  Heparin level sub-therapeutic  INR 1.7  Goal of Therapy:  Heparin level  0.3-0.7 units/ml Monitor platelets by anticoagulation protocol: Yes   Plan:  Inc heparin to 1750 units/hr 1200 heparin level  Abran Duke, PharmD, BCPS Clinical Pharmacist Phone: [phone]

## 2023-11-14 NOTE — Evaluation (Signed)
Occupational Therapy Evaluation Patient Details Name: Tony Zhang MRN: [id number] DOB: Mar 03, 1946 Today's Date: 11/14/2023   History of Present Illness Pt is 78 yo presenting to [facility] 1/21 for scheduled surgery. 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 Hospital stay complicated by acute pancreatitis, urinary retention and symptomatic hypotension. PMH includes: CAD (NSTEMI with stents placed 8/23), HFrEF, PAD, DVT/PE, HTN, COPD, CKD, neuropathy, and gout.   Clinical Impression   PTA, pt lived alone and was mod I for ADL. Upon eval, pt presents with decreased activity tolerance, pain in BLE and stomach, and increased O2 demand. Pt reports he does not wear O2 at baseline and was on 5 L on arrival; was able to ambulate with VSS on 4L; RN informed. HR max 120 and SpO2 92-100. Pt requires CGA for safety and use of RW. Pt needing significant assist for LB ADL secondary to abdominal pain. Will follow acutely; pending progress, recommending discharge home with HHOT once medically ready.       If plan is discharge home, recommend the following: A little help with walking and/or transfers;A little help with bathing/dressing/bathroom;A lot of help with bathing/dressing/bathroom;Assistance with cooking/housework;Assist for transportation;Help with stairs or ramp for entrance    Functional Status Assessment  Patient has had a recent decline in their functional status and demonstrates the ability to make significant improvements in function in a reasonable and predictable amount of time.  Equipment Recommendations  None recommended by OT (pt has necessary equipment)    Recommendations for Other Services       Precautions / Restrictions Precautions Precautions: Fall Restrictions Weight Bearing Restrictions Per Provider Order: No      Mobility Bed Mobility Overal bed mobility: Modified Independent             General bed mobility comments: increased time    Transfers Overall transfer level: Needs  assistance Equipment used: Rolling walker (2 wheels) Transfers: Sit to/from Stand Sit to Stand: Supervision           General transfer comment: unsteady upon standing      Balance Overall balance assessment: Needs assistance Sitting-balance support: Bilateral upper extremity supported Sitting balance-Leahy Scale: Normal     Standing balance support: Single extremity supported, Bilateral upper extremity supported, Reliant on assistive device for balance, During functional activity Standing balance-Leahy Scale: Poor Standing balance comment: requires UE support statically and dynamically                           ADL either performed or assessed with clinical judgement   ADL Overall ADL's : Needs assistance/impaired Eating/Feeding: Modified independent;Sitting   Grooming: Set up;Sitting   Upper Body Bathing: Set up;Sitting   Lower Body Bathing: Sit to/from stand;Maximal assistance   Upper Body Dressing : Set up;Sitting   Lower Body Dressing: Maximal assistance;Sit to/from stand Lower Body Dressing Details (indicate cue type and reason): umable to reach feet at time of eval secondary to abd pain Toilet Transfer: Contact guard assist;Rolling walker (2 wheels);Ambulation   Toileting- Clothing Manipulation and Hygiene: Contact guard assist;Sit to/from stand       Functional mobility during ADLs: Contact guard assist;Rolling walker (2 wheels)       Vision Baseline Vision/History: 1 Wears glasses Ability to See in Adequate Light: 0 Adequate Patient Visual Report: No change from baseline Vision Assessment?: No apparent visual deficits     Perception Perception: Not tested       Praxis Praxis: Not tested  Pertinent Vitals/Pain Pain Assessment Pain Assessment: Faces Faces Pain Scale: Hurts little more Pain Location: abdomen Pain Descriptors / Indicators: Aching Pain Intervention(s): Limited activity within patient's tolerance, Monitored during  session     Extremity/Trunk Assessment Upper Extremity Assessment Upper Extremity Assessment: Generalized weakness   Lower Extremity Assessment Lower Extremity Assessment: Defer to PT evaluation   Cervical / Trunk Assessment Cervical / Trunk Assessment: Normal   Communication Communication Communication: No apparent difficulties Cueing Techniques: Verbal cues   Cognition Arousal: Alert Behavior During Therapy: WFL for tasks assessed/performed Overall Cognitive Status: Within Functional Limits for tasks assessed                                       General Comments  HR max 120 during mobility with RW    Exercises     Shoulder Instructions      Home Living Family/patient expects to be discharged to:: Private residence Living Arrangements: Alone Available Help at Discharge: Friend(s);Available PRN/intermittently Type of Home: Mobile home Home Access: Stairs to enter Entrance Stairs-Number of Steps: 4 Entrance Stairs-Rails: Right;Left Home Layout: One level     Bathroom Shower/Tub: Chief Strategy Officer: Standard     Home Equipment: Agricultural consultant (2 wheels);Rollator (4 wheels);Cane - single point;Shower seat;BSC/3in1   Additional Comments: pt reports his friend can come by as needed to assist, cannot provide physical assist      Prior Functioning/Environment Prior Level of Function : Independent/Modified Independent;Working/employed;Driving             Mobility Comments: pt reports furniture walking in his home, does have recent falls but is usually able to get himself up ADLs Comments: pt reports independent, typically does not need to use shower seat. Sleeps on bed or couch. rarely asks any friends or family for help        OT Problem List: Decreased strength;Decreased activity tolerance;Impaired balance (sitting and/or standing);Cardiopulmonary status limiting activity;Pain      OT Treatment/Interventions: Self-care/ADL  training;Therapeutic exercise;DME and/or AE instruction;Balance training;Patient/family education;Therapeutic activities    OT Goals(Current goals can be found in the care plan section) Acute Rehab OT Goals Patient Stated Goal: get better OT Goal Formulation: With patient Time For Goal Achievement: 11/28/23 Potential to Achieve Goals: Good  OT Frequency: Min 1X/week    Co-evaluation              AM-PAC OT "6 Clicks" Daily Activity     Outcome Measure Help from another person eating meals?: None Help from another person taking care of personal grooming?: A Little Help from another person toileting, which includes using toliet, bedpan, or urinal?: A Little Help from another person bathing (including washing, rinsing, drying)?: A Lot Help from another person to put on and taking off regular upper body clothing?: A Little Help from another person to put on and taking off regular lower body clothing?: A Lot 6 Click Score: 17   End of Session Equipment Utilized During Treatment: Gait belt;Rolling walker (2 wheels) Nurse Communication: Mobility status  Activity Tolerance: Patient tolerated treatment well Patient left: in bed;with call bell/phone within reach;with bed alarm set  OT Visit Diagnosis: Unsteadiness on feet (R26.81);Muscle weakness (generalized) (M62.81)                Time: 1030-1100 OT Time Calculation (min): 30 min Charges:  OT General Charges $OT Visit: 1 Visit OT Evaluation $OT Eval Moderate  Complexity: 1 Mod OT Treatments $Self Care/Home Management : 8-22 mins  Tyler Deis, OTR/L [facility] Acute Rehabilitation Office: [phone]   Myrla Halsted 11/14/2023, 12:45 PM

## 2023-11-14 NOTE — Progress Notes (Addendum)
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 No shortness of breath or orthopnea. Pt says he is scheduled for surgery Wednesday.   Inpatient Medications    Scheduled Meds:  atorvastatin  20 mg Oral Daily   Chlorhexidine Gluconate Cloth  6 each Topical Daily   cilostazol  100 mg Oral Daily   fluticasone furoate-vilanterol  1 puff Inhalation Daily   gabapentin  100 mg Oral BID   gabapentin  300 mg Oral BID   sodium chloride flush  3 mL Intravenous Q12H   tamsulosin  0.8 mg Oral QPM   Continuous Infusions:  heparin 1,750 Units/hr (11/14/23 0417)   PRN Meds: acetaminophen, dextrose, hydrALAZINE, HYDROmorphone (DILAUDID) injection, ondansetron (ZOFRAN) IV, oxyCODONE, sodium chloride flush   Vital Signs    Vitals:   11/13/23 1948 11/13/23 2337 11/14/23 0351 11/14/23 0610  BP: (!) 156/39 (!) 111/50 101/61   Pulse: (!) 113 88 84 80  Resp: 20 15 20 19   Temp: (!) 97.5 F (36.4 C) 98.9 F (37.2 C) 97.9 F (36.6 C)   TempSrc: Oral Oral Oral   SpO2: 92% 90%  96% 93%  Weight:    102.3 kg  Height:        Intake/Output Summary (Last 24 hours) at 11/14/2023 0759 Last data filed at 11/14/2023 0352 Gross per 24 hour  Intake 368.41 ml  Output 1400 ml  Net -1031.59 ml   Filed Weights   11/12/23 0500 11/13/23 0500 11/14/23 0610  Weight: 102.2 kg 101.7 kg 102.3 kg    Physical Exam    GEN: Well nourished, well developed, obese HEENT: Grossly normal.  Neck: Supple, no JVD, Cardiac: RRR, no murmurs, rubs, or gallops. No clubbing, cyanosis, edema.   Respiratory:  Respirations regular and unlabored, clear to auscultation bilaterally. GI: abdominal distension and tenderness to palpation MS: no deformity or atrophy. Skin: warm and dry, no rash. Groin/radial/subclvian site stable. Subclavian site with ecchymosis but no hematoma.  Neuro:  Strength and sensation are intact. Psych: AAOx3.  Normal affect.  Labs    CBC Recent Labs    11/13/23 0355 11/14/23 0300  WBC 8.8 7.2  NEUTROABS 7.6 5.9  HGB 13.1 12.6*  HCT 39.4 38.7*  MCV 94.7 95.3  PLT 177 187   Basic Metabolic Panel Recent Labs    46/96/29 0355 11/14/23 0300  NA 135 136  K 4.5 4.4  CL 105 106  CO2 21* 21*  GLUCOSE 83 112*  BUN 29* 23  CREATININE 1.54* 1.55*  CALCIUM 8.1* 8.3*  MG 1.6* 2.2  PHOS 2.4* 2.8   Liver Function  Tests Recent Labs    11/13/23 0355 11/14/23 0300  AST 44*  37 25  ALT 11  10 13   ALKPHOS 50  47 51  BILITOT 1.1  1.0 0.8  PROT 5.8*  5.7* 5.7*  ALBUMIN 2.3*  2.3* 2.3*   Recent Labs    11/13/23 0355 11/14/23 0300  LIPASE 69* 71*    Telemetry    NSR HRs in 80s  - Personally Reviewed  ECG    Sinus with inferior anteroseptal q waves, 86 bpm. - Personally Reviewed   Cardiac Studies   AVR OPERATIVE NOTE     Date of Procedure:                11/08/2023   Preoperative Diagnosis:      Severe Aortic Stenosis    Postoperative Diagnosis:    Same    Procedure:        Transcatheter Aortic Valve Replacement - Open left subclavian  Approach             Edwards Sapien 3 Ultra Resilia THV (size 26 mm, serial # [id number])              Co-Surgeons:                        Evelene Croon, MD and Tonny Bollman, MD   Anesthesiologist:                  Corky Sox, MD   Echocardiographer:              Thurmon Fair, MD   Pre-operative Echo Findings: Severe aortic stenosis Normal left ventricular systolic function   Post-operative Echo Findings: No paravalvular leak Normal left ventricular systolic function _____________     Echo 11/09/23: IMPRESSIONS   1. Left ventricular ejection fraction, by estimation, is 65 to 70%. The  left ventricle has normal function. The left ventricle has no regional  wall motion abnormalities. There is mild left ventricular hypertrophy.  Left ventricular diastolic parameters  are indeterminate.   2. Right ventricular systolic function is normal. The right ventricular  size is normal. Tricuspid regurgitation signal is inadequate for assessing  PA pressure.   3. The mitral valve is normal in structure. Trivial mitral valve  regurgitation.   4. The aortic valve has been repaired/replaced. Aortic valve  regurgitation is not visualized. There is a 26 mm Edwards Sapien  prosthetic, stented (TAVR) valve present in the aortic      Procedure Date: 11/08/23. Echo findings are consistent with normal  structure and function of the aortic valve prosthesis. Vmax 1.6 m/s, MG  , EOA 2.1 cm^2, DI 0.74   Patient Profile     Tony Zhang is a 78 y.o. 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   Assessment & Plan    Severe AS: s/p successful TAVR with a 26 mm Edwards Sapien 3 Ultra Resilia THV via the TF approach on 11/09/23. Post operative echo showed EF 65%, normally functioning TAVR with a mean gradient of 5 mmHg and no PVL.  Groin/subclavian/radial sites are stable. Resumed on home Coumadin, but now held with potential surgery on Wednesday. Continue heparin gtt   Symptomatic hypotension: pt has had episodic hypotension that responds to IV fluid boluses.  Echo with normal LV/RV/TAVR function and  no effusion. Likely due to hypotension in setting of pancreatitis.  Resolved with IV fluids.   Acute on chronic HFpEF: as evidenced by an elevated LVEDP ~28 mm hg at the time of TAVR. Treated with one dose of IV lasix 40mg /KDur on 1/22. Lasix, Jardiance & Toprol-XL held given AKI and hypotension. Thankfully, has not had any s/s of acute CHF. BNP 255. Watch volume status carefully with hydration.   AKI with baseline CKD stage IIIb: baseline creat 1.5-2.4. Creat peaked at 3.8. Likely multifactorial with contrast exposure during TAVR, hypotension and poor PO intake. Resolved with IV fluids, creatinine down to 1.55 today   Acute gallstone pancreatitis: imaging was obtained and CT scan showed acute pancreatitis as well as gallstones.  Ultrasound showed difficult visualization but dilated gallbladder without obvious stones.  MRCP showed acute pancreatitis and cholelithiasis without acute cholecystitis. Seen by surgery who may do cholecystectomy on Wednesday depending on clinical improvement. Discussed with Dr. Bjorn Pippin over the weekend who felt like he would be acceptable risk for surgery even with recent TAVR.   Acute urinary retention: oliguria and retention. Now s/p foley. Flomax increased.   Hyponatremia: resolved.   CAD: s/p DES to LAD 2023. Pre op [facility] 09/28/23 showed severe calcified stenosis of the RCA unchanged from prior in 2023, patent mid LAD stent with moderate nonobstructive disease proximally, patent left circumflex, and patent subclavian with mild stenosis. Plan for medical management of CAD. Continue on atorvastatin 40 mg daily, Repatha 140mg  q 2weeks. No aspirin given chronic OAC.    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 Continue medical management with Pletal 100mg  daily, atorvastatin 40 mg daily, Repatha 140mg  q 2weeks. No aspirin given chronic OAC.    DVT/PE: resumed on home Coumadin, but now discontinued for potential cholecystectomy. Heparin for DVT prophylaxis   Previous RLL filling defect in bronchus: possibly mucus plugging. Repeat chest CT chest was "reassuring that the previously seen endobronchial lesion was benign. 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   SignedCline Crock, PA-C  11/14/2023, 7:59 AM  Pager [phone]  I have personally seen and examined this patient. I agree with the assessment and plan as outlined above. Doing well this am from a cardiac standpoint. Post TAVR with normally functioning AVR by echo last week. Coumadin on hold for upcoming cholecystectomy this week. He is on a heparin drip while awaiting surgery. No changes today from our standpoint.   Verne Carrow, MD, [facility] 11/14/2023 8:41 AM

## 2023-11-15 ENCOUNTER — Inpatient Hospital Stay (HOSPITAL_COMMUNITY): Payer: Medicare Other

## 2023-11-15 DIAGNOSIS — Z954 Presence of other heart-valve replacement: Secondary | ICD-10-CM | POA: Diagnosis not present

## 2023-11-15 DIAGNOSIS — I350 Nonrheumatic aortic (valve) stenosis: Secondary | ICD-10-CM | POA: Diagnosis not present

## 2023-11-15 DIAGNOSIS — Z952 Presence of prosthetic heart valve: Secondary | ICD-10-CM | POA: Diagnosis not present

## 2023-11-15 DIAGNOSIS — I97638 Postprocedural hematoma of a circulatory system organ or structure following other circulatory system procedure: Secondary | ICD-10-CM | POA: Diagnosis not present

## 2023-11-15 DIAGNOSIS — N179 Acute kidney failure, unspecified: Secondary | ICD-10-CM | POA: Diagnosis not present

## 2023-11-15 DIAGNOSIS — E861 Hypovolemia: Secondary | ICD-10-CM | POA: Diagnosis not present

## 2023-11-15 DIAGNOSIS — R310 Gross hematuria: Secondary | ICD-10-CM

## 2023-11-15 DIAGNOSIS — R9389 Abnormal findings on diagnostic imaging of other specified body structures: Secondary | ICD-10-CM | POA: Diagnosis not present

## 2023-11-15 LAB — CBC WITH DIFFERENTIAL/PLATELET
Abs Immature Granulocytes: 0.22 10*3/uL — ABNORMAL HIGH (ref 0.00–0.07)
Basophils Absolute: 0 10*3/uL (ref 0.0–0.1)
Basophils Relative: 0 %
Eosinophils Absolute: 0.2 10*3/uL (ref 0.0–0.5)
Eosinophils Relative: 3 %
HCT: 39.2 % (ref 39.0–52.0)
Hemoglobin: 12.9 g/dL — ABNORMAL LOW (ref 13.0–17.0)
Immature Granulocytes: 3 %
Lymphocytes Relative: 6 %
Lymphs Abs: 0.4 10*3/uL — ABNORMAL LOW (ref 0.7–4.0)
MCH: 31.3 pg (ref 26.0–34.0)
MCHC: 32.9 g/dL (ref 30.0–36.0)
MCV: 95.1 fL (ref 80.0–100.0)
Monocytes Absolute: 0.7 10*3/uL (ref 0.1–1.0)
Monocytes Relative: 9 %
Neutro Abs: 6 10*3/uL (ref 1.7–7.7)
Neutrophils Relative %: 79 %
Platelets: 186 10*3/uL (ref 150–400)
RBC: 4.12 MIL/uL — ABNORMAL LOW (ref 4.22–5.81)
RDW: 15.3 % (ref 11.5–15.5)
WBC: 7.5 10*3/uL (ref 4.0–10.5)
nRBC: 0 % (ref 0.0–0.2)

## 2023-11-15 LAB — CBC
HCT: 39.7 % (ref 39.0–52.0)
Hemoglobin: 13.3 g/dL (ref 13.0–17.0)
MCH: 31.4 pg (ref 26.0–34.0)
MCHC: 33.5 g/dL (ref 30.0–36.0)
MCV: 93.9 fL (ref 80.0–100.0)
Platelets: 223 10*3/uL (ref 150–400)
RBC: 4.23 MIL/uL (ref 4.22–5.81)
RDW: 15.3 % (ref 11.5–15.5)
WBC: 7.6 10*3/uL (ref 4.0–10.5)
nRBC: 0 % (ref 0.0–0.2)

## 2023-11-15 LAB — URINALYSIS, COMPLETE (UACMP) WITH MICROSCOPIC: RBC / HPF: >50 RBC/hpf (ref 0–5)

## 2023-11-15 LAB — COMPREHENSIVE METABOLIC PANEL
ALT: 17 U/L (ref 0–44)
AST: 33 U/L (ref 15–41)
Albumin: 2.3 g/dL — ABNORMAL LOW (ref 3.5–5.0)
Alkaline Phosphatase: 56 U/L (ref 38–126)
Anion gap: 9 (ref 5–15)
BUN: 18 mg/dL (ref 8–23)
CO2: 20 mmol/L — ABNORMAL LOW (ref 22–32)
Calcium: 8.5 mg/dL — ABNORMAL LOW (ref 8.9–10.3)
Chloride: 103 mmol/L (ref 98–111)
Creatinine, Ser: 1.54 mg/dL — ABNORMAL HIGH (ref 0.61–1.24)
GFR, Estimated: 46 mL/min — ABNORMAL LOW (ref >60)
Glucose, Bld: 105 mg/dL — ABNORMAL HIGH (ref 70–99)
Potassium: 4.2 mmol/L (ref 3.5–5.1)
Sodium: 132 mmol/L — ABNORMAL LOW (ref 135–145)
Total Bilirubin: 1.2 mg/dL (ref 0.0–1.2)
Total Protein: 5.9 g/dL — ABNORMAL LOW (ref 6.5–8.1)

## 2023-11-15 LAB — GLUCOSE, CAPILLARY
Glucose-Capillary: 112 mg/dL — ABNORMAL HIGH (ref 70–99)
Glucose-Capillary: 96 mg/dL (ref 70–99)
Glucose-Capillary: 81 mg/dL (ref 70–99)
Glucose-Capillary: 75 mg/dL (ref 70–99)
Glucose-Capillary: 104 mg/dL — ABNORMAL HIGH (ref 70–99)

## 2023-11-15 LAB — LIPASE, BLOOD: Lipase: 62 U/L — ABNORMAL HIGH (ref 11–51)

## 2023-11-15 LAB — PROTIME-INR
INR: 2 — ABNORMAL HIGH (ref 0.8–1.2)
Prothrombin Time: 22.6 s — ABNORMAL HIGH (ref 11.4–15.2)

## 2023-11-15 LAB — PHOSPHORUS: Phosphorus: 2.6 mg/dL (ref 2.5–4.6)

## 2023-11-15 LAB — HEPARIN LEVEL (UNFRACTIONATED): Heparin Unfractionated: 0.27 [IU]/mL — ABNORMAL LOW (ref 0.30–0.70)

## 2023-11-15 LAB — MAGNESIUM: Magnesium: 1.9 mg/dL (ref 1.7–2.4)

## 2023-11-15 MED ORDER — LACTATED RINGERS IV SOLN: INTRAVENOUS | Status: AC

## 2023-11-15 MED ORDER — HEPARIN SODIUM (PORCINE) 5000 UNIT/ML IJ SOLN
5000.0000 [IU] | Freq: Three times a day (TID) | INTRAMUSCULAR | Status: AC
  Administered 2023-11-15 – 2023-11-20 (×16): 5000 [IU] via SUBCUTANEOUS
  Filled 2023-11-15 (×16): qty 1

## 2023-11-15 MED ORDER — IPRATROPIUM BROMIDE 0.02 % IN SOLN
0.5000 mg | Freq: Two times a day (BID) | RESPIRATORY_TRACT | Status: DC
Start: 2023-11-15 — End: 2023-11-24
  Administered 2023-11-15 – 2023-11-24 (×18): 0.5 mg via RESPIRATORY_TRACT
  Filled 2023-11-15 (×18): qty 2.5

## 2023-11-15 MED ORDER — IOHEXOL 350 MG/ML SOLN
50.0000 mL | Freq: Once | INTRAVENOUS | Status: AC | PRN
  Administered 2023-11-15: 50 mL via INTRAVENOUS

## 2023-11-15 MED ORDER — LEVALBUTEROL HCL 0.63 MG/3ML IN NEBU
0.6300 mg | INHALATION_SOLUTION | Freq: Two times a day (BID) | RESPIRATORY_TRACT | Status: DC
Start: 2023-11-15 — End: 2023-11-24
  Administered 2023-11-15 – 2023-11-24 (×18): 0.63 mg via RESPIRATORY_TRACT
  Filled 2023-11-15 (×18): qty 3

## 2023-11-15 NOTE — Progress Notes (Signed)
PHARMACY - ANTICOAGULATION CONSULT NOTE  Pharmacy Consult for Warfarin > IV heparin Indication: VTE history  Allergies  Allergen Reactions   Codeine Hives and Other (See Comments)    Takes benadryl to stop allergic reactions    Patient Measurements: Height: 6\' 1"  (185.4 cm) Weight: 102.5 kg (225 lb 15.5 oz) IBW/kg (Calculated) : 79.9 Heparin dosing weight = 100 kg  Vital Signs: Temp: 98.1 F (36.7 C) (01/28 0813) Temp Source: Oral (01/28 0813) BP: 117/47 (01/28 0813) Pulse Rate: 94 (01/28 0813)  Labs: Recent Labs    11/13/23 0355 11/13/23 0355 11/14/23 0300 11/14/23 1156 11/14/23 2052 11/15/23 0336  HGB 13.1  --  12.6*  --   --  12.9*  HCT 39.4  --  38.7*  --   --  39.2  PLT 177  --  187  --   --  186  LABPROT 17.8*  --  20.2*  --   --  22.6*  INR 1.4*  --  1.7*  --   --  2.0*  HEPARINUNFRC  --    < > 0.19* 0.17* 0.23* 0.27*  CREATININE 1.54*  --  1.55*  --   --  1.54*   < > = values in this interval not displayed.    Estimated Creatinine Clearance: 49.7 mL/min (A) (by C-G formula based on SCr of 1.54 mg/dL (H)).   Medical History: Past Medical History:  Diagnosis Date   AAA (abdominal aortic aneurysm) (HCC)    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     CAD (coronary artery disease)    COPD (chronic obstructive pulmonary disease) (HCC)    DJD (degenerative joint disease)    Dyslipidemia    Gout    Malignant hypertension    Peripheral vascular disease (HCC)    Pneumonia 08/18/2013   pt. 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 Excell Seltzer and Dr. Laneta Simmers   Severe aortic stenosis    Venous thrombosis    Recurrent    Assessment: 78 year old male s/p TAVR 1/21, on warfarin PTA for hx  recurrent VTE.  -PTA warfarin regimen - 4 mg Sat / Sun, 2 mg other days. Now on heparin and warfarin on hold.    -heparin level 0.27 on 2200 units/hr -INR 2.0 with trend up, CBC stable, LFTs WNL  Goal of Therapy:  Heparin level 0.3-0.7 units/ml Monitor platelets by anticoagulation protocol: Yes   Plan:  Increase Heparin to 2300 units/hr Monitor daily heparin level, CBC F/u INR trend  Harland German, PharmD Clinical Pharmacist **Pharmacist phone directory can now be found on [email or web address] ([email or web address]).  Listed under [facility].

## 2023-11-15 NOTE — Plan of Care (Signed)

## 2023-11-15 NOTE — Progress Notes (Signed)
This RN found patient incision on left chest swollen and tender to touch which looks very different than the morning assessment ,also his urine is getting more concentrated.Katie,PA notified about the changes and she is at bedside

## 2023-11-15 NOTE — Progress Notes (Addendum)
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 Very tender to abdominal palpation. No shortness of breath or orthopnea. He is NPO currently.   Inpatient Medications    Scheduled Meds:  atorvastatin  20 mg Oral Daily   Chlorhexidine Gluconate Cloth  6 each Topical Daily   cilostazol  100 mg Oral Daily   fluticasone furoate-vilanterol  1 puff Inhalation Daily   gabapentin  100 mg Oral BID   gabapentin  300 mg Oral BID   guaiFENesin  1,200 mg Oral BID   ipratropium  0.5 mg Nebulization Q6H   levalbuterol  0.63 mg Nebulization Q6H   sodium chloride flush  3 mL Intravenous Q12H   tamsulosin  0.8 mg Oral QPM   Continuous Infusions:  heparin 2,300 Units/hr (11/15/23 0904)   PRN Meds: acetaminophen, dextrose, hydrALAZINE, HYDROmorphone (DILAUDID) injection, ondansetron (ZOFRAN) IV, oxyCODONE, sodium chloride flush   Vital Signs    Vitals:   11/15/23 0349 11/15/23 0449 11/15/23 0756 11/15/23 0813  BP: (!) 155/75   (!) 117/47  Pulse: 89  100 94  Resp: 15 18 20  17   Temp: 98 F (36.7 C)   98.1 F (36.7 C)  TempSrc: Oral   Oral  SpO2: 96%   90%  Weight:  102.5 kg    Height:        Intake/Output Summary (Last 24 hours) at 11/15/2023 0905 Last data filed at 11/15/2023 0350 Gross per 24 hour  Intake 483 ml  Output 1150 ml  Net -667 ml   Filed Weights   11/13/23 0500 11/14/23 0610 11/15/23 0449  Weight: 101.7 kg 102.3 kg 102.5 kg    Physical Exam    GEN: Well nourished, well developed, obese HEENT: Grossly normal.  Neck: Supple, no JVD, Cardiac: RR, mildly tachy, no murmurs, rubs, or gallops. No clubbing, cyanosis, edema.   Respiratory:  Respirations regular and unlabored, clear to auscultation bilaterally. GI: abdominal distension and tenderness to palpation MS: no deformity or atrophy. Skin: warm and dry, no rash. Groin/radial/subclvian site stable. Subclavian site with ecchymosis but no hematoma.  Neuro:  Strength and sensation are intact. Psych: AAOx3.  Normal affect.  Labs    CBC Recent Labs    11/14/23 0300 11/15/23 0336  WBC 7.2 7.5  NEUTROABS 5.9 6.0  HGB 12.6* 12.9*  HCT 38.7* 39.2  MCV 95.3 95.1  PLT 187 186   Basic Metabolic Panel Recent Labs    16/10/96 0300 11/15/23 0336  NA 136 132*  K 4.4 4.2  CL 106 103  CO2 21* 20*  GLUCOSE 112* 105*  BUN  23 18  CREATININE 1.55* 1.54*  CALCIUM 8.3* 8.5*  MG 2.2 1.9  PHOS 2.8 2.6   Liver Function Tests Recent Labs    11/14/23 0300 11/15/23 0336  AST 25 33  ALT 13 17  ALKPHOS 51 56  BILITOT 0.8 1.2  PROT 5.7* 5.9*  ALBUMIN 2.3* 2.3*   Recent Labs    11/14/23 0300 11/15/23 0336  LIPASE 71* 62*    Telemetry    NSR HRs in 90s, low 100s, short run of SVT  - Personally Reviewed  ECG    Sinus with inferior anteroseptal q waves, 86 bpm. - Personally Reviewed   Cardiac Studies   AVR OPERATIVE NOTE     Date of Procedure:                11/08/2023   Preoperative Diagnosis:      Severe Aortic Stenosis    Postoperative Diagnosis:    Same     Procedure:        Transcatheter Aortic Valve Replacement - Open left subclavian Approach             Edwards Sapien 3 Ultra Resilia THV (size 26 mm, serial # [id number])              Co-Surgeons:                        Evelene Croon, MD and Tonny Bollman, MD   Anesthesiologist:                  Corky Sox, MD   Echocardiographer:              Thurmon Fair, MD   Pre-operative Echo Findings: Severe aortic stenosis Normal left ventricular systolic function   Post-operative Echo Findings: No paravalvular leak Normal left ventricular systolic function _____________     Echo 11/09/23: IMPRESSIONS   1. Left ventricular ejection fraction, by estimation, is 65 to 70%. The  left ventricle has normal function. The left ventricle has no regional  wall motion abnormalities. There is mild left ventricular hypertrophy.  Left ventricular diastolic parameters  are indeterminate.   2. Right ventricular systolic function is normal. The right ventricular  size is normal. Tricuspid regurgitation signal is inadequate for assessing  PA pressure.   3. The mitral valve is normal in structure. Trivial mitral valve  regurgitation.   4. The aortic valve has been repaired/replaced. Aortic valve  regurgitation is not visualized. There is a 26 mm Edwards Sapien  prosthetic, stented (TAVR) valve present in the aortic      Procedure Date: 11/08/23. Echo findings are consistent with normal  structure and function of the aortic valve prosthesis. Vmax 1.6 m/s, MG  , EOA 2.1 cm^2, DI 0.74   Patient Profile     JUN OSMENT is a 78 y.o. 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   Assessment & Plan    Severe AS: s/p successful TAVR with a 26 mm Edwards Sapien 3 Ultra Resilia THV via the TF approach on 11/09/23. Post operative echo showed EF 65%,  normally functioning TAVR with a mean gradient of 5 mmHg and no PVL. Groin/subclavian/radial sites are stable. Coumadin on hold with possible need for cholecystectomy. Continue heparin gtt.    Symptomatic hypotension: pt had episodic hypotension that  responded to IV fluid boluses.  Echo with normal LV/RV/TAVR function and no effusion. Likely due to hypotension in setting of pancreatitis.  Resolved with IV fluids. BP 117/47   Acute on chronic HFpEF: as evidenced by an elevated LVEDP ~28 mm hg at the time of TAVR. Treated with one dose of IV lasix 40mg /KDur on 1/22. Lasix, Jardiance & Toprol-XL held given AKI and hypotension. Thankfully, has not had any s/s of acute CHF. BNP 255. Watch volume status carefully with hydration.  Sinus tach with short run of SVT: home dose of Toprol XL 100mg  daily has been on hold. Likely reactive in the setting of pain. Could consider adding Toprol back a much lower dose.    AKI with baseline CKD stage IIIb: baseline creat 1.5-2.4. Creat peaked at 3.8. Likely multifactorial with contrast exposure during TAVR, hypotension and poor PO intake. Resolved with IV fluids. Creatinine down to 1.54 today, which is his baseline.    Acute gallstone pancreatitis: imaging was obtained and CT scan showed acute pancreatitis as well as gallstones. Ultrasound showed difficult visualization but dilated gallbladder without obvious stones.  MRCP showed acute pancreatitis and cholelithiasis without acute cholecystitis. Seen by surgery who may do cholecystectomy depending on clinical status. They may repeat imaging today. We will follow along.   Acute urinary retention: oliguria and retention. Now s/p foley. Flomax increased.   Hyponatremia: Na 132.   CAD: s/p DES to LAD 2023. Pre op [facility] 09/28/23 showed severe calcified stenosis of the RCA unchanged from prior in 2023, patent mid LAD stent with moderate nonobstructive disease proximally, patent left circumflex, and patent subclavian  with mild stenosis. Plan for medical management of CAD. Continue on atorvastatin 40 mg daily, Repatha 140mg  q 2weeks. No aspirin given chronic OAC.    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 Continue medical management with Pletal 100mg  daily, atorvastatin 40 mg daily, Repatha 140mg  q 2weeks. No aspirin given chronic OAC.   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    DVT/PE: resumed on home Coumadin, but now discontinued for potential cholecystectomy. Heparin for DVT prophylaxis   Previous RLL filling defect in bronchus: possibly mucus plugging. Repeat chest CT chest was "reassuring that the previously seen endobronchial lesion was benign. 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   SignedCline Crock, PA-C  11/15/2023, 9:05 AM  Pager [phone]  I have personally seen and examined this patient. I agree with the assessment and plan as outlined above.  He is NPO for pending surgery for gallstone related pancreatitis. No new cardiac issues today. Sinus tachycardia noted on telemetry. Volume status is ok.   Verne Carrow, MD, [facility] 11/15/2023 9:34 AM

## 2023-11-15 NOTE — Progress Notes (Signed)
Physical Therapy Treatment Patient Details Name: Tony Zhang MRN: [id number] DOB: July 01, 1946 Today's Date: 11/15/2023   History of Present Illness Pt is 78 yo presenting to [facility] 1/21 for scheduled surgery. 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 Hospital stay complicated by acute pancreatitis, urinary retention and symptomatic hypotension. PMH includes: CAD (NSTEMI with stents placed 8/23), HFrEF, PAD, DVT/PE, HTN, COPD, CKD, neuropathy, and gout.    PT Comments  Pt received in bed, reports not feeling great today. HR 116 bpm and SPO2 96% on 4L O2 at rest. Pt tolerance for activity has decreased since last session. All mobility is increasing his abdominal pain, today from 3/10 to 8/10. His HR increased to 137 bpm with ambulation, SPO2 92% on 4L O2. Pt tolerated only 7' ambulation with RW before needing to turn back to bed due to SOB and abdominal pain. Worked on seated and standing balance. PT will continue to follow.     If plan is discharge home, recommend the following: A little help with walking and/or transfers;Assist for transportation;Assistance with cooking/housework;Help with stairs or ramp for entrance   Can travel by private vehicle        Equipment Recommendations  None recommended by PT    Recommendations for Other Services       Precautions / Restrictions Precautions Precautions: Fall Restrictions Weight Bearing Restrictions Per Provider Order: No     Mobility  Bed Mobility Overal bed mobility: Modified Independent             General bed mobility comments: increased time, needs encouragement    Transfers Overall transfer level: Needs assistance Equipment used: Rolling walker (2 wheels) Transfers: Sit to/from Stand Sit to Stand: Min assist           General transfer comment: min A for power up and to steady. Performed multiple times, occasionally able to stand wth CGA    Ambulation/Gait Ambulation/Gait assistance: Contact guard assist Gait Distance  (Feet): 16 Feet Assistive device: Rolling walker (2 wheels) Gait Pattern/deviations: Step-through pattern, Decreased step length - right, Decreased step length - left Gait velocity: decreased Gait velocity interpretation: <1.31 ft/sec, indicative of household ambulator   General Gait Details: limited by abdominal pain and DOE. HR up to 137 bpm, SPO2 92% on 4L O2.   Stairs             Wheelchair Mobility     Tilt Bed    Modified Rankin (Stroke Patients Only)       Balance Overall balance assessment: Needs assistance Sitting-balance support: No upper extremity supported Sitting balance-Leahy Scale: Fair Sitting balance - Comments: tried to get pt reaching in sitting but he is avoidant due to abdominal pain. Did tossing and thowing activity but if pt had to reach he would just let it go. Did have sufficient reaction time to catch   Standing balance support: Bilateral upper extremity supported, Reliant on assistive device for balance, During functional activity Standing balance-Leahy Scale: Poor Standing balance comment: requires UE support statically and dynamically                            Cognition Arousal: Alert Behavior During Therapy: WFL for tasks assessed/performed Overall Cognitive Status: Within Functional Limits for tasks assessed  Exercises      General Comments General comments (skin integrity, edema, etc.): HR 116 bpm at rest, SPO2 96% at rest on 4L O2. HR 137 bpm with ambulation and SPO2 92% on 4L      Pertinent Vitals/Pain Pain Assessment Pain Assessment: 0-10 Faces Pain Scale: Hurts whole lot Pain Location: abdomen after mobility (was 3/10 before mobility) Pain Descriptors / Indicators: Aching Pain Intervention(s): Patient requesting pain meds-RN notified, Monitored during session, Premedicated before session, Limited activity within patient's tolerance    Home Living                           Prior Function            PT Goals (current goals can now be found in the care plan section) Acute Rehab PT Goals Patient Stated Goal: to improve mobility and go home. PT Goal Formulation: With patient Time For Goal Achievement: 11/27/23 Potential to Achieve Goals: Fair Progress towards PT goals: Progressing toward goals    Frequency    Min 1X/week      PT Plan      Co-evaluation              AM-PAC PT "6 Clicks" Mobility   Outcome Measure  Help needed turning from your back to your side while in a flat bed without using bedrails?: A Little Help needed moving from lying on your back to sitting on the side of a flat bed without using bedrails?: A Little Help needed moving to and from a bed to a chair (including a wheelchair)?: A Little Help needed standing up from a chair using your arms (e.g., wheelchair or bedside chair)?: A Little Help needed to walk in hospital room?: A Lot Help needed climbing 3-5 steps with a railing? : A Lot 6 Click Score: 16    End of Session Equipment Utilized During Treatment: Gait belt Activity Tolerance: Patient limited by fatigue;Patient limited by pain Patient left: in bed;with call bell/phone within reach;with bed alarm set Nurse Communication: Mobility status PT Visit Diagnosis: Other abnormalities of gait and mobility (R26.89);Unsteadiness on feet (R26.81)     Time: 4098-1191 PT Time Calculation (min) (ACUTE ONLY): 33 min  Charges:    $Gait Training: 8-22 mins $Therapeutic Activity: 8-22 mins PT General Charges $$ ACUTE PT VISIT: 1 Visit                     Lyanne Co, PT  Acute Rehab Services Secure chat preferred Office [phone]    [place] L Lina Hitch 11/15/2023, 1:25 PM

## 2023-11-15 NOTE — Progress Notes (Signed)
PROGRESS NOTE    Tony Zhang  GNF:[id number] DOB: 08-02-1946 DOA: 11/08/2023 PCP: System, Provider Not In   Brief Narrative:  Tony Zhang is a 78 y.o. male with past medical history of hypertension, dyslipidemia, CAD, severe aortic stenosis, PVD, history of recurrent venous thrombosis, presented for transcatheter aortic valve replacement.  Following the procedure performed on 1/21 patient reported developing severe abdominal pain in the upper region, central and radiating to the left side, two days after surgery. The pain is persistent and does not move. Accompanying the pain is a feeling of nausea, for which medication was administered. However, the medication seems to induce excessive sleepiness. The patient denies any history of alcohol use.   There is no prior history of pancreatitis, a condition that the recent CT scan suggests. The patient also reports a decrease in appetite, consuming minimal food. Pain management is currently through Tylenol, as other pain medications have previously caused stomach upset. The patient's last bowel movement was least 2 days ago.  Labs today note creatinine 2.56 with BUN 37.  Creatinine was noted to be 1.61 with BUN 27 yesterday.  Records note patient had been given Lasix 40 mg IV and was noted to be transiently hypotensive.   With his progressive decline throughout the day on 11/11/23 the primary team transferred to the ICU and consulted critical care.  He will be continued IV fluid hydration and will continue supportive care.  He was stablized and given IVF and now doing better  TRH now resuming medical care again on 11/13/23. Pancreatitis is still persistent and surgery has been consulted and will consider doing a cholecystectomy during hospitalization but will make the patient n.p.o. given his abdominal discomfort and pain.  If he is not improving the plan is to repeat his LFTs and lipase in the morning as well as possibly repeating his imaging to evaluate  for pancreatic necrosis or pseudocyst.  Today he was found to have a new subclavian site hematoma and started having some hematuria.  His heparin drip which was initiated yesterday after his Coumadin was held has now been discontinued.  Will need to monitor his renal function carefully and continue Foley catheter drainage and case was discussed with urology who recommends supportive care for now no CBI.  Anticipating hematuria to clear out the next few days and if not urology can be formally consulted.  CT surgery was consulted for his hematoma at the subclavian site and stat CT was done and Dr. Cliffton Asters of cardiothoracic surgery recommends to just discontinue the heparin drip and placed on VTE prophylaxis dosing.  Assessment and Plan:  Acute Pancreatitis -CT Abd/Pelvis done and showed " Acute Pancreatitis. Secondary inflammation of the duodenum and trace free fluid. Hyperdense urine within the urinary bladder. Nonobstructed kidneys. Correlate with urinalysis. Otherwise stable noncontrast CT appearance of the abdomen and pelvis. 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 Cholelithiasis. Aortic Atherosclerosis" -RUQ U/S done and showed "Limited examination with overlapping bowel gas and soft tissue. Poor visualization of the pancreas, common duct. Dilated gallbladder without obvious stones. There was a stone in the gallbladder on CT." -Lipase Level Improving and now likely Plateaued; Lipase Level Trend: Recent Labs  Lab 11/10/23 1236 11/11/23 0325 11/12/23 0320 11/13/23 0355 11/14/23 0300  LIPASE 666* 205* 72* 69* 71*  -IVF now stopped by Primary  -Supportive Care and c/w Antiemetics with Ondansetron 4 mg q6hprn Nausea and Vomiting  -Checked MRI/MRCP to evaluate for choledocholithiasis and showed "Acute pancreatitis. No evidence of  pancreatic necrosis. No organized fluid collections. Cholelithiasis without evidence of acute cholecystitis. No choledocholithiasis. No biliary duct  dilatation." -Clear liquid diet advanced to FULL yesterday but will go to NPO now given continued Abdominal Pain and worsening -WBC Trend: Recent Labs  Lab 11/04/23 0807 11/09/23 0309 11/10/23 0308 11/11/23 0329 11/12/23 0320 11/13/23 0355 11/14/23 0300  WBC 8.8 9.4 14.8* 15.0* 10.7* 8.8 7.2  -CXR and KUB done and showed "Likely bilateral trace pleural effusions. Nonobstructive bowel gas pattern.  Aortic Atherosclerosis" -Continue Slow diet advancement and Fluids as needed.  -C/w Pain Control with Acetaminophen 1000 mg po q6hprn Mild Pain, Oxycodone IR 5 mg po q4hprn Moderate Pain, and IV Hydromorphone 0.5 mg q4hprn Break Through Pain -Discussed with General Surgery and he is still very tender and patient is symptomatic.  They are going to see how the patient progresses and determine if he is appropriate for surgery during this hospitalization; plan is for repeat labs including LFTs and lipase in a.m. if he is not improving may be a repeat CT scan abdomen pelvis to be necessary to ensure the patient is not developing pancreatic necrosis or pseudocyst -If he is not eating very much may need to give him postpyloric tube feedings and place Cortrak -No Need for Abx as patient does not have Pancreatic Necrosis and because SIRS is improved  -Per surgery ideally the patient's timeframe for elective surgery for cholecystectomy will be 1 month out however cardiology feels that he is a low enough risk that they could consider surgery during hospitalization and they want to see how aggressive to determine if surgery is appropriate.  Coumadin has been held and has been placed on a heparin drip but now this is being discontinued given his hematoma and hematuria and just will go on VTE prophylaxis dosing  Lethargy, improved -Transferred to ICU per Primary on 11/11/23 and now being transferred to the Floor -Appreciated PCCM evaluation  Acute Respiratory Failure with Hypoxia SpO2: 95 % O2 Flow Rate  (L/min): 5 L/min -CXR today showed " Chronic lung changes compatible with emphysema. Linear densities at the left lung base most likely associated with atelectasis." -CT Chest w/o Contrast showed "Previously seen endobronchial lesion is no longer identified consistent with transient mucous plugging. No new endobronchial lesion is seen. Stable atherosclerotic changes of the thoracic aorta with areas of mild dilatation in the ascending aorta and proximal aortic arch similar to that seen on the recent exam. Recommend semi-annual imaging followup by CTA or MRA and referral to cardiothoracic surgery if not already obtained. Aortic aneurysm. Aortic Atherosclerosis and Emphysema" -C/w Xopenex/Atrovent and Breo Ellipta  -Add Flutter Valve, Incentive Spirometry, and Guaifenesin 1200 mg po BID -Pulse oximetry maintain O2 saturation greater 90% -Continue supplemental oxygen via nasal cannula wean O2 as tolerated  Transient Hypotension, improved -In the setting of Hypovolemia after Furosemide and Metoprolol Administration as well as having poor Po Intake -Blood pressures noted to be as low as 90/31 yesterday.  Suspect secondary to fluid losses and was also noted to be when he was  -IV fluids now discontinued by Primary; Patient responded to IVF Hydration an since BP is on the softer side still will give another 500 mL bolus. -Avoid blood pressure lowering medications at this time. -Transferred out of the unt and will resume IV fluid hydration with LR at 50 mL/h -Continue to Monitor BP per Protocol; Last BP reading was 146/38  Hx AoC HFpEF CAD s/p DES to LAD 2023 Severe PAD with known occlusion of  the distal abdominal aorta, renal artery stenosis, celiac/mesenteric artery stenosis, and total RICA occulsion and 40-50% LICA occulusion -C/w liquid diet and advanced to FULL, although expected p.o. intake will be minimal -Was given a dose of IV Lasix 40 mg 1/22 -Maintenance IVF discontinued by Cardiology  yesterday but I resumed today at LR 50 mL/h -Continue to hold Metoprolol, Jardiance -Continue Cilostazol 100 mg po Daily, Atorvastatin 20 mg po Daily, Repatha; No ASA given Warfarain  -Checked BNP level and was 255, although clinically he does not appear hypervolemic. -Per Cardiology Medical Management of CAD -C/w OOB and Ambulation and PT/OT to further evaluate and Treat  Acute Urinary Retention -S/p IVF and recommend TOV for Catheter when more mobile and recommend that be done soon -C/w Tamsulosin 0.8 mg po at bedtime for now  Hematuria -Has a Hx of Prostatomegaly and foley had been placed this Admission -Worsened in the setting of [facility] -Discussed with Urology Dr. Berneice Heinrich who recommends no CBI at this point and no CT Hematuria Study to be done -Dr. Berneice Heinrich recommends Supportive Care for now but if having clots or worsening can call back and he can formally consult -AC transitioned had been heparin gtt and now drip is going to be changed to 5000 units SQ Q8h -Check UA, bladder scan and renal ultrasound  Severe Aortic Stenosis -Status post TAVR postoperative day 6 -Per primary and C/w Post-op Care Per Cardiology  -Anticoagulation with Warfarin has been resumed; Warfarin Dose per Pharmacy  Previous RLL filling defect in bronchus -Possibly mucus plugging. -Follow up abdominal CT at the same level (could be off by a few cm) is reassurring, but repeat dedicated chest imaging is appropriate to ensure this has resolved fully. H/o tobacco abuse. -CT chest to reassess and Per PCCM was "reassuring that the previously seen endobronchial lesion was benign. No follow up CT required for this. Surveillance of aortic aneurysm per Cardiology recommendations."  Subclavian site hematoma -Worsened in the setting of his heparin drip -IR thoracic surgery has been consulted and they have assessed the patient and hemoglobin/hematocrit is being trended -Hgb/Hct Trend: Recent Labs  Lab 11/10/23 0308  11/11/23 0329 11/12/23 0320 11/13/23 0355 11/14/23 0300 11/15/23 0336 11/15/23 1458  HGB 16.4 14.7 12.9* 13.1 12.6* 12.9* 13.3  HCT 49.0 44.7 39.2 39.4 38.7* 39.2 39.7  MCV 94.0 95.9 95.4 94.7 95.3 95.1 93.9  -Cardiothoracic surgery consulted for further evaluation given that he noticed the swelling left side of his chest when he woke up from his nap and because is getting bigger. -Stat CT of the chest ordered and showed "Fluid in the left supraclavicular soft tissues only partially included in the field of view. This is ill-defined without peripheral enhancement. There is also mild stranding of the left pectoralis musculature. Trace left pleural thickening/effusion. No other acute intrathoracic abnormality. Findings of pancreatitis, without significant change from recent abdominal CT. TAVR.  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 Aortic Atherosclerosis and Emphysema" -Cardiology team discussed with Dr. 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 and stable for 1 year in the 2024 F/U -C/w Outpatient Monitoring and Follow Up  Hx of DVT -Anticoagulation with Coumadin resumed  back to heparin drip given anticipated surgical intervention and now heparin drip has been held given his subclavian site hematoma and hematuria  Hypomagnesemia -Patient's Mag Level Trend: Recent Labs  Lab 11/09/23 0309 11/11/23 0325 11/12/23 0320 11/13/23 0355 11/14/23 0300  MG 1.6* 1.5* 1.4* 1.6* 2.2  -Replete with IV Mag Sulfate 2 grams yesterday -Continue to Monitor and Replete as Necessary -Repeat Mag in the AM   Hypophosphatemia -Phos Level Trend: Recent Labs   Lab 11/11/23 0325 11/12/23 0320 11/13/23 0355 11/14/23 0300  PHOS 4.2 3.0 2.4* 2.8  -Replete with po K Phos Neutral 500 mg x1 yesterday -Continue to Monitor and Replete as Necessary -Repeat Phos Level in the AM  AKI on CKD Stage 3b, improving and stable Metabolic Acidosis, mild -BUN/Cr Trend: Recent Labs  Lab 11/09/23 0309 11/10/23 0308 11/11/23 0329 11/11/23 1336 11/12/23 0320 11/13/23 0355 11/14/23 0300  BUN 24* 37* 52* 53* 41* 29* 23  CREATININE 1.61* 2.56* 3.84* 3.16* 2.17* 1.54* 1.55*  -Metabolic Acidosis improving; Has a Slight Metabolic Acidosis still with a CO2 of 21, anion gap of 9, chloride level 106 -Resume IVF with LR at 50 mL/hr x 1 Day but may need to increase the rate -Avoid Nephrotoxic Medications, Contrast Dyes, Hypotension and Dehydration to Ensure Adequate Renal Perfusion and will need to Renally Adjust Meds -C.w Strict I's and O's and Monitoring of UOP -Continue to Monitor and Trend Renal Function carefully and repeat CMP in the AM   Hyperbilirubinemia, improved  -T Bili Trend: Recent Labs  Lab 11/04/23 0807 11/10/23 1236 11/11/23 0325 11/11/23 1336 11/12/23 0320 11/13/23 0355 11/14/23 0300  BILITOT 1.1 1.8* 1.5* 1.2 1.1 1.1  1.0 0.8  -Continue to Monitor and Trend and Repeat CMP in the AM  Hypoalbuminemia -Patient's Albumin Trend: Recent Labs  Lab 11/04/23 0807 11/10/23 1236 11/11/23 0325 11/11/23 1336 11/12/23 0320 11/13/23 0355 11/14/23 0300  ALBUMIN 4.1 3.5 2.7* 2.5* 2.3* 2.3*  2.3* 2.3*  -Continue to Monitor and Trend and repeat CMP in the AM   DVT prophylaxis: heparin injection 5,000 Units Start: 11/15/23 2200 SCDs Start: 11/08/23 1647    Code Status: Full Code Family Communication: No family present at bedside  Disposition Plan:  Level of care: Telemetry Cardiac Status is: Inpatient Remains inpatient appropriate because: Further clinical improvement and disposition patient per primary once he is medically clear    Consultants:  TRH Case was discussed with gastroenterology Cardiothoracic surgery Cardiology is primary PCCM transfer General Surgery  Procedures:  As delineated as above  Antimicrobials:  Anti-infectives (From admission, onward)    Start     Dose/Rate Route Frequency Ordered Stop   11/08/23 2100  ceFAZolin (ANCEF) IVPB 2g/100 mL premix        2 g 200 mL/hr over 30 Minutes Intravenous Every 8 hours 11/08/23 1647 11/09/23 0548   11/08/23 0400  ceFAZolin (ANCEF) IVPB 2g/100 mL premix  Status:  Discontinued        2 g 200 mL/hr over 30 Minutes Intravenous To Surgery 11/07/23 1323 11/08/23 1514       Subjective: Examined at bedside was still having quite a bit of abdominal discomfort and pain.  Heart rate was elevated today 2.  Noticed a on his chest wall today after the encounter so cardiology is evaluated for hematoma.  Also had started having some hematuria.  Feels okay she has the pain is not as bad and thinks he is slowly improving but heart rate is still elevated.  No other concerns or  complaints at this time.  Objective: Vitals:   11/15/23 1203 11/15/23 1324 11/15/23 1620 11/15/23 1751  BP:  136/60 (!) 154/51   Pulse: 99 (!) 102 100   Resp:  16 19 (!) 21  Temp:   98.2 F (36.8 C)   TempSrc:   Oral   SpO2:  94% 94%   Weight:      Height:        Intake/Output Summary (Last 24 hours) at 11/15/2023 1858 Last data filed at 11/15/2023 1800 Gross per 24 hour  Intake 886.7 ml  Output 1200 ml  Net -313.3 ml   Filed Weights   11/13/23 0500 11/14/23 0610 11/15/23 0449  Weight: 101.7 kg 102.3 kg 102.5 kg   Examination: Physical Exam:  Constitutional: WN/WD obese chronically ill-appearing Caucasian male who appears a little uncomfortable Respiratory: Diminished to auscultation bilaterally with some coarse breath sounds and some crackles and some slight rhonchi but no appreciable wheezing rales. Normal respiratory effort and patient is not tachypenic. No accessory muscle  use.  Wearing supplemental oxygen via nasal cannula on 5 L Cardiovascular: Tachycardic rate, no murmurs / rubs / gallops. S1 and S2 auscultated.  No appreciable extremity edema Abdomen: Soft, tender to palpate and distended secondary to body habitus. Bowel sounds positive.  GU: Deferred. Musculoskeletal: No clubbing / cyanosis of digits/nails. No joint deformity upper and lower extremities.  Skin: No rashes, lesions, ulcers on limited skin evaluation. No induration; Warm and dry.  Neurologic: CN 2-12 grossly intact with no focal deficits. Romberg sign and cerebellar reflexes not assessed.  Psychiatric: Normal judgment and insight. Alert and oriented x 3.  A little Anxious mood  Data Reviewed: I have personally reviewed following labs and imaging studies  CBC: Recent Labs  Lab 11/12/23 0320 11/13/23 0355 11/14/23 0300 11/15/23 0336 11/15/23 1458  WBC 10.7* 8.8 7.2 7.5 7.6  NEUTROABS  --  7.6 5.9 6.0  --   HGB 12.9* 13.1 12.6* 12.9* 13.3  HCT 39.2 39.4 38.7* 39.2 39.7  MCV 95.4 94.7 95.3 95.1 93.9  PLT 164 177 187 186 223   Basic Metabolic Panel: Recent Labs  Lab 11/11/23 0325 11/11/23 0329 11/11/23 1336 11/12/23 0320 11/13/23 0355 11/14/23 0300 11/15/23 0336  NA  --    < > 132* 134* 135 136 132*  K  --    < > 4.1 4.0 4.5 4.4 4.2  CL  --    < > 103 106 105 106 103  CO2  --    < > 16* 20* 21* 21* 20*  GLUCOSE  --    < > 57* 70 83 112* 105*  BUN  --    < > 53* 41* 29* 23 18  CREATININE  --    < > 3.16* 2.17* 1.54* 1.55* 1.54*  CALCIUM  --    < > 7.7* 7.8* 8.1* 8.3* 8.5*  MG 1.5*  --   --  1.4* 1.6* 2.2 1.9  PHOS 4.2  --   --  3.0 2.4* 2.8 2.6   < > = values in this interval not displayed.   GFR: Estimated Creatinine Clearance: 49.7 mL/min (A) (by C-G formula based on SCr of 1.54 mg/dL (H)). Liver Function Tests: Recent Labs  Lab 11/11/23 1336 11/12/23 0320 11/13/23 0355 11/14/23 0300 11/15/23 0336  AST 19 21 44*  37 25 33  ALT 7 10 11  10 13 17   ALKPHOS 37*  40 50  47 51 56  BILITOT 1.2 1.1 1.1  1.0 0.8 1.2  PROT 5.9* 5.5* 5.8*  5.7* 5.7* 5.9*  ALBUMIN 2.5* 2.3* 2.3*  2.3* 2.3* 2.3*   Recent Labs  Lab 11/10/23 1236 11/11/23 0325 11/12/23 0320 11/13/23 0355 11/14/23 0300 11/15/23 0336  LIPASE 666* 205* 72* 69* 71* 62*  AMYLASE 495*  --   --   --   --   --    No results for input(s): "AMMONIA" in the last 168 hours. Coagulation Profile: Recent Labs  Lab 11/11/23 0329 11/12/23 0320 11/13/23 0355 11/14/23 0300 11/15/23 0336  INR 1.2 1.4* 1.4* 1.7* 2.0*   Cardiac Enzymes: No results for input(s): "CKTOTAL", "CKMB", "CKMBINDEX", "TROPONINI" in the last 168 hours. BNP (last 3 results) No results for input(s): "PROBNP" in the last 8760 hours. HbA1C: No results for input(s): "HGBA1C" in the last 72 hours. CBG: Recent Labs  Lab 11/14/23 2354 11/15/23 0344 11/15/23 0859 11/15/23 1158 11/15/23 1707  GLUCAP 96 112* 96 81 75   Lipid Profile: No results for input(s): "CHOL", "HDL", "LDLCALC", "TRIG", "CHOLHDL", "LDLDIRECT" in the last 72 hours. Thyroid Function Tests: No results for input(s): "TSH", "T4TOTAL", "FREET4", "T3FREE", "THYROIDAB" in the last 72 hours. Anemia Panel: No results for input(s): "VITAMINB12", "FOLATE", "FERRITIN", "TIBC", "IRON", "RETICCTPCT" in the last 72 hours. Sepsis Labs: Recent Labs  Lab 11/10/23 1003 11/10/23 1236  LATICACIDVEN 1.2 1.5   No results found for this or any previous visit (from the past 240 hours).   Radiology Studies: CT CHEST W CONTRAST Result Date: 11/15/2023 CLINICAL DATA:  Chest wall pain, nontraumatic, history of chest intervention, xray done acute chest hematoma Electronic records indicates history of trans catheter aortic valve replacement, left subclavian, 1 week ago. EXAM: CT CHEST WITH CONTRAST TECHNIQUE: Multidetector CT imaging of the chest was performed during intravenous contrast administration. RADIATION DOSE REDUCTION: This exam was performed according to the  departmental dose-optimization program which includes automated exposure control, adjustment of the mA and/or kV according to patient size and/or use of iterative reconstruction technique. CONTRAST:  50mL OMNIPAQUE IOHEXOL 350 MG/ML SOLN COMPARISON:  Chest CT 11/11/2025, 3 days ago FINDINGS: Cardiovascular: The heart is normal in size. TAVR. Stable ascending aorta at 4 cm. Aortic atherosclerosis. There is no periaortic stranding. Normal caliber of the main pulmonary artery. No pericardial effusion. There is narrowing of the left subclavian vein as it courses under the clavicle, series 3, image 32. No evidence of intraluminal filling defect. Mediastinum/Nodes: Subcentimeter short axis mediastinal lymph nodes are unchanged from prior exam. No suspicious mediastinal adenopathy. Decompressed esophagus. Tiny hiatal hernia. Hilar assessment is limited due to phase of IV contrast. Lungs/Pleura: Moderate emphysema. Right apical and upper lobe scarring. Minimal peripheral scarring in the left lower lobe laterally. Mild bronchial thickening. 7 mm perifissural lymph node in the right lung series 5, image 79, minimally increased in size. Stable 3 mm left lower lobe nodule series 5, image 111. Trace left pleural effusions/thickening. No features of pulmonary edema. Upper Abdomen: Mild hepatic steatosis. Cyst in the subcapsular right lobe of the liver. There is peripancreatic fat stranding diffusely. This is not significantly changed from 11/10/2023 abdominal CT. Bilateral renal cysts. No further follow-up imaging is recommended. Musculoskeletal: There is left supraclavicular stranding extending into the pectoralis musculature. Stranding does not appear to be centered on the subclavian vessels. There is increasing fluid in the left supraclavicular soft tissues which is only partially included in the field of view, for example series 3 images 1-7. There is stranding of the subcutaneous tissues in the midline chest.  Mild  glenohumeral osteoarthritis of both shoulders mild thoracic spondylosis. IMPRESSION: 1. Fluid in the left supraclavicular soft tissues only partially included in the field of view. This is ill-defined without peripheral enhancement. There is also mild stranding of the left pectoralis musculature. 2. Trace left pleural thickening/effusion. No other acute intrathoracic abnormality. 3. Findings of pancreatitis, without significant change from recent abdominal CT. 4. TAVR.  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 Aortic Atherosclerosis (ICD10-I70.0) and Emphysema (ICD10-J43.9). Electronically Signed   By: Narda Rutherford M.D.   On: 11/15/2023 17:25   DG CHEST PORT 1 VIEW Result Date: 11/14/2023 CLINICAL DATA:  Shortness of breath. EXAM: PORTABLE CHEST 1 VIEW COMPARISON:  11/11/2023 FINDINGS: Chronic lung changes compatible with emphysema. Linear densities at the left lung base most likely associated with atelectasis. Heart size is within normal limits and stable. Negative for a pneumothorax. IMPRESSION: 1. Chronic lung changes compatible with emphysema. 2. Linear densities at the left lung base most likely associated with atelectasis. Electronically Signed   By: Richarda Overlie M.D.   On: 11/14/2023 08:25   Scheduled Meds:  atorvastatin  20 mg Oral Daily   Chlorhexidine Gluconate Cloth  6 each Topical Daily   cilostazol  100 mg Oral Daily   fluticasone furoate-vilanterol  1 puff Inhalation Daily   gabapentin  100 mg Oral BID   gabapentin  300 mg Oral BID   guaiFENesin  1,200 mg Oral BID   heparin injection (subcutaneous)  5,000 Units Subcutaneous Q8H   ipratropium  0.5 mg Nebulization BID   levalbuterol  0.63 mg Nebulization BID   sodium chloride flush  3 mL Intravenous Q12H   tamsulosin  0.8 mg Oral QPM   Continuous Infusions:  lactated ringers 50 mL/hr at 11/15/23 1324    LOS: 7 days   Marguerita Merles, DO [facility] Available via Epic secure chat 7am-7pm After these hours, please refer  to coverage provider listed on [email or web address] 11/15/2023, 6:58 PM

## 2023-11-15 NOTE — Progress Notes (Signed)
  [facility]   MULTIDISCIPLINARY HEART VALVE TEAM  Called to bedside due to a new hematoma at his subclavian site. This was not there this morning and now the size lemon. It feels like a "knot" but not painful. He noticed it after taking a nap after working with PT. Discussed with Dr. Clifton [name] and Dr. Cliffton Asters who will come assess the pt. His BP is stable at 136/60 and he remains mildly tachycardic with HRs in low 100s.  Cline Crock PA-C  [facility]

## 2023-11-15 NOTE — Progress Notes (Cosign Needed Addendum)
[address]       [place] [address]             [phone]      7 Days Post-Op Procedure(s) (LRB): Transcatheter Aortic Valve Replacement-Subclavian (Left) TRANSESOPHAGEAL ECHOCARDIOGRAM (N/A) Subjective: The patient states he noticed the swelling on his left sided chest when he woke up from his nap. He worked with PT prior to his nap. He states it is sore and feels tight. He is having some shortness of breath with sitting up and has dizziness that is not new. He denies chest pain.   Objective: Vital signs in last 24 hours: Temp:  [97.8 F (36.6 C)-98.6 F (37 C)] 97.9 F (36.6 C) (01/28 1200) Pulse Rate:  [89-109] 102 (01/28 1324) Cardiac Rhythm: Normal sinus rhythm (01/28 0701) Resp:  [15-20] 16 (01/28 1324) BP: (93-155)/(45-75) 136/60 (01/28 1324) SpO2:  [90 %-96 %] 94 % (01/28 1324) Weight:  [102.5 kg] 102.5 kg (01/28 0449)  Hemodynamic parameters for last 24 hours:    Intake/Output from previous day: 01/27 0701 - 01/28 0700 In: 683 [P.O.:680; I.V.:3] Out: 1150 [Urine:1150] Intake/Output this shift: No intake/output data recorded.  General appearance: alert, cooperative, and no distress Neurologic: intact Heart: sinus tachycardia Lungs: clear to auscultation bilaterally Abdomen: soft, non-tender; bowel sounds normal; no masses,  no organomegaly Extremities: extremities normal, atraumatic, no cyanosis or edema Wound: Wound is intact without dehiscence, drainage or sign of infection. There is about a 9cmx7cm tender nodule around the incision site with ecchymosis but no erythema. Feels like a hematoma.   Lab Results: Recent Labs    11/14/23 0300 11/15/23 0336  WBC 7.2 7.5  HGB 12.6* 12.9*  HCT 38.7* 39.2  PLT 187 186   BMET:  Recent Labs    11/14/23 0300 11/15/23 0336  NA 136 132*  K 4.4 4.2  CL 106 103  CO2 21* 20*  GLUCOSE 112* 105*  BUN 23 18  CREATININE 1.55* 1.54*  CALCIUM 8.3* 8.5*    PT/INR:  Recent Labs     11/15/23 0336  LABPROT 22.6*  INR 2.0*   ABG    Component Value Date/Time   PHART 7.405 09/13/2020 1301   HCO3 24.1 09/28/2023 1118   HCO3 23.5 09/28/2023 1118   TCO2 24 11/08/2023 1436   ACIDBASEDEF 1.0 09/28/2023 1118   ACIDBASEDEF 2.0 09/28/2023 1118   O2SAT 65 09/28/2023 1118   O2SAT 64 09/28/2023 1118   CBG (last 3)  Recent Labs    11/15/23 0344 11/15/23 0859 11/15/23 1158  GLUCAP 112* 96 81    Assessment/Plan: S/P Procedure(s) (LRB): Transcatheter Aortic Valve Replacement-Subclavian (Left) TRANSESOPHAGEAL ECHOCARDIOGRAM (N/A)  General: Patient appears stable sitting on the side of the bed, does not look to be in acute distress  CV: Sinus tachycardia into the 120s, last BP 136/60.   Renal: Cr 1.54, has been as high as 3.84 this hospitalization. Looks like he is having hematuria, possible clot seen in the catheter. Nurse states this is worse than it was around 2:00PM when she felt the urine was becoming more concentrated.   Wound: Likely hematoma around previous TAVR incision. Nurse noted the swelling around 1:30PM, she states it has not increased in size since then. The area was marked and measured about 9cmx7cm. There is ecchymosis but no sign of infection. Patient states it feels tight and sore. Will get STAT CT Chest with contrast and check STAT CBC. H/H 12.9/39.2 this AM.   Anticoagulation: INR  was 2.0 this AM. He is on heparin per pharmacy. 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 May need to hold heparin?  Dispo: STAT CBC and Chest CT have been ordered. If hematoma may require surgical drainage? Dr. Cliffton Asters to see the patient and determine ultimate plan.   LOS: 7 days    Jenny Reichmann, PA-C 11/15/2023  Agree with above Checking CT chest and H/H Decreasing heparin dose Further recs to follow.  Harrell Keane Scrape

## 2023-11-15 NOTE — Progress Notes (Signed)
Mobility Specialist Progress Note:   11/15/23 1449  Mobility  Activity Dangled on edge of bed  Level of Assistance Minimal assist, patient does 75% or more  Activity Response RN notified  Mobility Referral Yes  Mobility visit 1 Mobility  Mobility Specialist Start Time (ACUTE ONLY) 1440  Mobility Specialist Stop Time (ACUTE ONLY) 1449  Mobility Specialist Time Calculation (min) (ACUTE ONLY) 9 min   Pt received in bed, SpO2 88% on 5L, agreeable to mobility session. HR 120 bpm dangling EOB, SpO2 94% on 5L. RN notified. Deferred further mobility d/t elevated HR. Left pt in bed, PA at bedside. Left with all needs met, call bell in reach.   [name] Mobility Specialist Please contact via Special educational needs teacher or  Rehab office at [phone]

## 2023-11-15 NOTE — Progress Notes (Signed)
7 Days Post-Op  Subjective: Still tender to palpation in epigastrium, lipase still elevated but downtrending.  Objective: Vital signs in last 24 hours: Temp:  [97.8 F (36.6 C)-98.6 F (37 C)] 98.1 F (36.7 C) (01/28 0813) Pulse Rate:  [88-109] 108 (01/28 0906) Resp:  [15-20] 17 (01/28 0813) BP: (93-155)/(38-75) 117/47 (01/28 0813) SpO2:  [90 %-96 %] 96 % (01/28 0906) Weight:  [102.5 kg] 102.5 kg (01/28 0449) Last BM Date : 11/07/23  Intake/Output from previous day: 01/27 0701 - 01/28 0700 In: 683 [P.O.:680; I.V.:3] Out: 1150 [Urine:1150] Intake/Output this shift: No intake/output data recorded.  PE: Gen:  Alert, NAD, pleasant Abd: Soft, at least mild distension, epigastrium tender to palpation without guarding today  Lab Results:  Recent Labs    11/14/23 0300 11/15/23 0336  WBC 7.2 7.5  HGB 12.6* 12.9*  HCT 38.7* 39.2  PLT 187 186   BMET Recent Labs    11/14/23 0300 11/15/23 0336  NA 136 132*  K 4.4 4.2  CL 106 103  CO2 21* 20*  GLUCOSE 112* 105*  BUN 23 18  CREATININE 1.55* 1.54*  CALCIUM 8.3* 8.5*   PT/INR Recent Labs    11/14/23 0300 11/15/23 0336  LABPROT 20.2* 22.6*  INR 1.7* 2.0*   CMP     Component Value Date/Time   NA 132 (L) 11/15/2023 0336   NA 140 10/05/2023 1010   K 4.2 11/15/2023 0336   CL 103 11/15/2023 0336   CO2 20 (L) 11/15/2023 0336   GLUCOSE 105 (H) 11/15/2023 0336   BUN 18 11/15/2023 0336   BUN 24 10/05/2023 1010   CREATININE 1.54 (H) 11/15/2023 0336   CREATININE 1.56 (H) 09/17/2013 1135   CALCIUM 8.5 (L) 11/15/2023 0336   PROT 5.9 (L) 11/15/2023 0336   PROT 7.2 09/22/2023 0947   ALBUMIN 2.3 (L) 11/15/2023 0336   ALBUMIN 4.4 09/22/2023 0947   AST 33 11/15/2023 0336   ALT 17 11/15/2023 0336   ALKPHOS 56 11/15/2023 0336   BILITOT 1.2 11/15/2023 0336   BILITOT 0.7 09/22/2023 0947   GFRNONAA 46 (L) 11/15/2023 0336   GFRAA >60 08/14/2019 0429   Lipase     Component Value Date/Time   LIPASE 62 (H) 11/15/2023  0336    Studies/Results: DG CHEST PORT 1 VIEW Result Date: 11/14/2023 CLINICAL DATA:  Shortness of breath. EXAM: PORTABLE CHEST 1 VIEW COMPARISON:  11/11/2023 FINDINGS: Chronic lung changes compatible with emphysema. Linear densities at the left lung base most likely associated with atelectasis. Heart size is within normal limits and stable. Negative for a pneumothorax. IMPRESSION: 1. Chronic lung changes compatible with emphysema. 2. Linear densities at the left lung base most likely associated with atelectasis. Electronically Signed   By: Richarda Overlie M.D.   On: 11/14/2023 08:25    Anti-infectives: Anti-infectives (From admission, onward)    Start     Dose/Rate Route Frequency Ordered Stop   11/08/23 2100  ceFAZolin (ANCEF) IVPB 2g/100 mL premix        2 g 200 mL/hr over 30 Minutes Intravenous Every 8 hours 11/08/23 1647 11/09/23 0548   11/08/23 0400  ceFAZolin (ANCEF) IVPB 2g/100 mL premix  Status:  Discontinued        2 g 200 mL/hr over 30 Minutes Intravenous To Surgery 11/07/23 1323 11/08/23 1514        Assessment/Plan Tony Zhang is an 78 y.o. male status post TAVR on 1/21 with subsequent development of abdominal pain and workup concerning for  acute pancreatitis secondary to gallstones.   - Previously discussed cardiac risk with primary team, cardiology, given recent TAVR.  Although ideally the timeframe is 1 month post TAVR for elective surgery, given the patient is quite symptomatic, cardiology states low enough risk that can consider surgery during this hospitalization. We will see how patient progresses and determine if appropriate for surgery during hospitalization. - Hold Coumadin, okay for heparin drip - Tachycardic, likely from pain but will need to have cards weigh in prior to surgery - We will follow with you  FEN - NPO, IVF per primary  VTE - SCDs, Heparin gtt ID - None currently.   I reviewed nursing notes, last 24 h vitals and pain scores, last 48 h intake and  output, last 24 h labs and trends, and last 24 h imaging results.  Discussed with TRH.   This required moderate level of medical decision making.   LOS: 7 days    Lysle Rubens , MD [facility] 11/15/2023, 10:40 AM Please see Amion for pager number during day hours 7:00am-4:30pm

## 2023-11-15 NOTE — Progress Notes (Signed)
  [facility]   MULTIDISCIPLINARY HEART VALVE TEAM  Discussed hematoma with Dr. Cliffton Asters who thinks it is related to heparin gtt. He is on full dose heparin. Will lower heparin down to 5000 units subQ q 8 hours. Discussed with Aggie Cosier with pharmacy who will change it now. Repeat Hg 13.3. CT chest with contrast pending. ALso noted to have blood in urine and concentrated urine. UA pending.   Cline Crock PA-C  [facility]

## 2023-11-16 ENCOUNTER — Inpatient Hospital Stay (HOSPITAL_COMMUNITY): Payer: Medicare Other

## 2023-11-16 DIAGNOSIS — N179 Acute kidney failure, unspecified: Secondary | ICD-10-CM | POA: Diagnosis not present

## 2023-11-16 DIAGNOSIS — Z952 Presence of prosthetic heart valve: Secondary | ICD-10-CM | POA: Diagnosis not present

## 2023-11-16 DIAGNOSIS — I5032 Chronic diastolic (congestive) heart failure: Secondary | ICD-10-CM | POA: Diagnosis not present

## 2023-11-16 DIAGNOSIS — K851 Biliary acute pancreatitis without necrosis or infection: Secondary | ICD-10-CM | POA: Diagnosis not present

## 2023-11-16 LAB — CBC WITH DIFFERENTIAL/PLATELET
Abs Immature Granulocytes: 0.39 10*3/uL — ABNORMAL HIGH (ref 0.00–0.07)
Basophils Absolute: 0.1 10*3/uL (ref 0.0–0.1)
Basophils Relative: 1 %
Eosinophils Absolute: 0.2 10*3/uL (ref 0.0–0.5)
Eosinophils Relative: 2 %
HCT: 39 % (ref 39.0–52.0)
Hemoglobin: 12.7 g/dL — ABNORMAL LOW (ref 13.0–17.0)
Immature Granulocytes: 5 %
Lymphocytes Relative: 5 %
Lymphs Abs: 0.3 10*3/uL — ABNORMAL LOW (ref 0.7–4.0)
MCH: 30.9 pg (ref 26.0–34.0)
MCHC: 32.6 g/dL (ref 30.0–36.0)
MCV: 94.9 fL (ref 80.0–100.0)
Monocytes Absolute: 0.5 10*3/uL (ref 0.1–1.0)
Monocytes Relative: 7 %
Neutro Abs: 5.8 10*3/uL (ref 1.7–7.7)
Neutrophils Relative %: 80 %
Platelets: 218 10*3/uL (ref 150–400)
RBC: 4.11 MIL/uL — ABNORMAL LOW (ref 4.22–5.81)
RDW: 15.2 % (ref 11.5–15.5)
WBC: 7.2 10*3/uL (ref 4.0–10.5)
nRBC: 0 % (ref 0.0–0.2)

## 2023-11-16 LAB — COMPREHENSIVE METABOLIC PANEL
ALT: 20 U/L (ref 0–44)
AST: 36 U/L (ref 15–41)
Albumin: 2.2 g/dL — ABNORMAL LOW (ref 3.5–5.0)
Alkaline Phosphatase: 68 U/L (ref 38–126)
Anion gap: 10 (ref 5–15)
BUN: 16 mg/dL (ref 8–23)
CO2: 20 mmol/L — ABNORMAL LOW (ref 22–32)
Calcium: 8.4 mg/dL — ABNORMAL LOW (ref 8.9–10.3)
Chloride: 101 mmol/L (ref 98–111)
Creatinine, Ser: 1.48 mg/dL — ABNORMAL HIGH (ref 0.61–1.24)
GFR, Estimated: 48 mL/min — ABNORMAL LOW (ref >60)
Glucose, Bld: 81 mg/dL (ref 70–99)
Potassium: 4.4 mmol/L (ref 3.5–5.1)
Sodium: 131 mmol/L — ABNORMAL LOW (ref 135–145)
Total Bilirubin: 1.2 mg/dL (ref 0.0–1.2)
Total Protein: 5.8 g/dL — ABNORMAL LOW (ref 6.5–8.1)

## 2023-11-16 LAB — PHOSPHORUS: Phosphorus: 2.5 mg/dL (ref 2.5–4.6)

## 2023-11-16 LAB — LIPASE, BLOOD: Lipase: 56 U/L — ABNORMAL HIGH (ref 11–51)

## 2023-11-16 LAB — PROTIME-INR
INR: 1.9 — ABNORMAL HIGH (ref 0.8–1.2)
Prothrombin Time: 22.1 s — ABNORMAL HIGH (ref 11.4–15.2)

## 2023-11-16 LAB — MAGNESIUM: Magnesium: 1.7 mg/dL (ref 1.7–2.4)

## 2023-11-16 LAB — GLUCOSE, CAPILLARY
Glucose-Capillary: 114 mg/dL — ABNORMAL HIGH (ref 70–99)
Glucose-Capillary: 79 mg/dL (ref 70–99)
Glucose-Capillary: 87 mg/dL (ref 70–99)

## 2023-11-16 MED ORDER — PROSOURCE TF20 ENFIT COMPATIBL EN LIQD
60.0000 mL | Freq: Every day | ENTERAL | Status: DC
  Administered 2023-11-16 – 2023-11-23 (×8): 60 mL
  Filled 2023-11-16 (×8): qty 60

## 2023-11-16 MED ORDER — OSMOLITE 1.5 CAL PO LIQD
1000.0000 mL | ORAL | Status: DC
Start: 2023-11-16 — End: 2023-11-18
  Administered 2023-11-16: 1000 mL
  Filled 2023-11-16 (×2): qty 1000

## 2023-11-16 MED ORDER — THIAMINE MONONITRATE 100 MG PO TABS
100.0000 mg | ORAL_TABLET | Freq: Every day | ORAL | Status: AC
  Administered 2023-11-16 – 2023-11-20 (×5): 100 mg
  Filled 2023-11-16 (×5): qty 1

## 2023-11-16 NOTE — Progress Notes (Signed)
Mobility Specialist Progress Note:    11/16/23 1533  Mobility  Activity Ambulated with assistance in room  Level of Assistance Minimal assist, patient does 75% or more  Assistive Device Front wheel walker  Distance Ambulated (ft) 24 ft  Activity Response Tolerated well  Mobility Referral Yes  Mobility visit 1 Mobility  Mobility Specialist Start Time (ACUTE ONLY) 1515  Mobility Specialist Stop Time (ACUTE ONLY) 1533  Mobility Specialist Time Calculation (min) (ACUTE ONLY) 18 min   Pt received in bed, agreeable to mobility session. Ambulated in room with RW. Required MinA to power up and stand, CGA required during ambulation. Tolerated well, c/o SOB, SpO2 94% on 5L. Pt requested to return to bed as opposed to chair. Left pt with all needs met, call bell in reach.   Pre Mobility: HR 96 bpm Sitting EOB: HR 106 bpm During Mobility: HR 125 bpm Post Mobility: HR 92 bpm  [name] Mobility Specialist Please contact via Special educational needs teacher or  Rehab office at [phone]

## 2023-11-16 NOTE — Progress Notes (Signed)
     [address]       [place] [address]             [phone]       CT reviewed Fluid collection not in continuity with subclavian artery.  Like seroma or hematoma 2/2 to anticoagulation.  No leukocytosis and H/H stable.  Pt not having much pain.  Would continue to watch for now.  Alease Fait Keane Scrape

## 2023-11-16 NOTE — Progress Notes (Signed)
Occupational Therapy Treatment Patient Details Name: Tony Zhang MRN: [id number] DOB: 1946/03/02 Today's Date: 11/16/2023   History of present illness Pt is 78 yo presenting to [facility] 1/21 for scheduled surgery. 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 Hospital stay complicated by acute pancreatitis, urinary retention and symptomatic hypotension. PMH includes: CAD (NSTEMI with stents placed 8/23), HFrEF, PAD, DVT/PE, HTN, COPD, CKD, neuropathy, and gout.   OT comments  Patient received in supine and agreeable to OT treatment. Patient declined OOB but willing to participate with self care tasks and standing from EOB. Patient able to perform Grooming and UB bathing and dressing tasks with setup. Patient required min assist to stand and CGA for balance with HR increasing to 130 before requiring seated break. Discharge recommendations continue to be appropriate with acute OT to continue to follow to address established goals to facilitate safe discharge.       If plan is discharge home, recommend the following:  A little help with walking and/or transfers;A little help with bathing/dressing/bathroom;A lot of help with bathing/dressing/bathroom;Assistance with cooking/housework;Assist for transportation;Help with stairs or ramp for entrance   Equipment Recommendations  None recommended by OT (pt has necessary equipment at home)    Recommendations for Other Services      Precautions / Restrictions Precautions Precautions: Fall Restrictions Weight Bearing Restrictions Per Provider Order: No       Mobility Bed Mobility Overal bed mobility: Modified Independent             General bed mobility comments: increased time with no physical assistance    Transfers Overall transfer level: Needs assistance Equipment used: Rolling walker (2 wheels) Transfers: Sit to/from Stand Sit to Stand: Min assist           General transfer comment: performed standing x2 from EOB with min assist to power up  and CGA for balance     Balance Overall balance assessment: Needs assistance Sitting-balance support: No upper extremity supported Sitting balance-Leahy Scale: Fair Sitting balance - Comments: able to perform self care tasks seated   Standing balance support: Bilateral upper extremity supported, Reliant on assistive device for balance, During functional activity Standing balance-Leahy Scale: Poor Standing balance comment: reliant on UE support for standing                           ADL either performed or assessed with clinical judgement   ADL Overall ADL's : Needs assistance/impaired     Grooming: Wash/dry hands;Wash/dry face;Oral care;Brushing hair;Set up;Sitting Grooming Details (indicate cue type and reason): EOB Upper Body Bathing: Set up;Sitting       Upper Body Dressing : Set up;Sitting                     General ADL Comments: declined going to sink, agreeable to sitting on EOB    Extremity/Trunk Assessment              Vision       Perception     Praxis      Cognition Arousal: Alert Behavior During Therapy: WFL for tasks assessed/performed Overall Cognitive Status: Within Functional Limits for tasks assessed                                          Exercises      Shoulder Instructions  General Comments 4 lites O2 with SpO2 95 %, HR increased to 130 with standing BP 112/58    Pertinent Vitals/ Pain       Pain Assessment Pain Assessment: Faces Faces Pain Scale: Hurts little more Pain Location: abdomen left shoulder Pain Descriptors / Indicators: Aching Pain Intervention(s): Monitored during session, Repositioned  Home Living                                          Prior Functioning/Environment              Frequency  Min 1X/week        Progress Toward Goals  OT Goals(current goals can now be found in the care plan section)  Progress towards OT goals: Progressing  toward goals  Acute Rehab OT Goals Patient Stated Goal: get better OT Goal Formulation: With patient Time For Goal Achievement: 11/28/23 Potential to Achieve Goals: Good ADL Goals Pt Will Perform Grooming: with supervision;standing Pt Will Perform Lower Body Dressing: with supervision;sit to/from stand Pt Will Transfer to Toilet: with supervision;ambulating Additional ADL Goal #1: pt will perform 5+ mins OOB ADL to optimize activity tolerance.  Plan      Co-evaluation                 AM-PAC OT "6 Clicks" Daily Activity     Outcome Measure   Help from another person eating meals?: None Help from another person taking care of personal grooming?: A Little Help from another person toileting, which includes using toliet, bedpan, or urinal?: A Little Help from another person bathing (including washing, rinsing, drying)?: A Lot Help from another person to put on and taking off regular upper body clothing?: A Little Help from another person to put on and taking off regular lower body clothing?: A Lot 6 Click Score: 17    End of Session Equipment Utilized During Treatment: Gait belt;Rolling walker (2 wheels)  OT Visit Diagnosis: Unsteadiness on feet (R26.81);Muscle weakness (generalized) (M62.81)   Activity Tolerance Patient tolerated treatment well   Patient Left in bed;with call bell/phone within reach;with bed alarm set   Nurse Communication Mobility status;Other (comment) (HR 130 with standing)        Time: 0865-7846 OT Time Calculation (min): 28 min  Charges: OT General Charges $OT Visit: 1 Visit OT Treatments $Self Care/Home Management : 23-37 mins  Alfonse Flavors, OTA Acute Rehabilitation Services  Office [phone]   Dewain Penning 11/16/2023, 12:30 PM

## 2023-11-16 NOTE — Progress Notes (Signed)
Initial Nutrition Assessment  DOCUMENTATION CODES:   Not applicable  INTERVENTION:   Initiate trickle tube feeding via Cortrak: Osmolite 1.5 @ 53mL/hr and hold Pro-Source TF20 60 mL daily   Once TF tolerance established, and able to advance, recommend: Advance Osmolite 1.5 by 10mL Q12H to goal rate of 70mL/hr  TF regimen to provide 2340kcal, 118g protein, free water  Monitor magnesium and phosphorus every 12 hours x 4 occurrences, MD to replete as needed, as pt is at risk for refeeding syndrome  Thiamine 100mg  daily x 5 days for refeeding  NUTRITION DIAGNOSIS:   Inadequate oral intake related to inability to eat as evidenced by NPO status.  GOAL:  Patient will meet greater than or equal to 90% of their needs  MONITOR:  TF tolerance, Diet advancement, Labs  REASON FOR ASSESSMENT:  Consult Enteral/tube feeding initiation and management  ASSESSMENT:  Pt is 78 y.o. male w/ PMH: HTN, dyslipidemia, CAD, severe aortic stenosis, COPD, CKD,  PVD, hx of recurrent venous thrombosis. Presented for transcatheter aortic valve replacement. He developed pancreatitis s/p procedure.  1/21 TAVR procedure 1/24 ICU d/t progressive decline 1/26 back to progressive unit  1/29 cortrak placed (distal duodenem), TF initiated  He continues with abdominal pain and some nausea without vomiting. Passing very little flatus and no BM in 8 days. Postpyloric Cortrak consult received d/t unresolved pancreatitis w/ possible ileus.  May need cholecystectomy depending on clinical status.   Will start trickles and advance as tolerated. Per surgery, will consider TPN if unable to tolerate.  Endorsed adequate appetite prior to admission. He states he eats only one meal per day that is late in the morning. Often consists of pasta, egg salad, or rice and beans. Does snack on potato chips or doughnuts and tomato or cranberry juice.   UBW around 230lbs prior to admission. Reports no significant weight  changes. No edema noted.   Admit Weight: 102.1kg Current Weight: 100.1kg   Intake/Output Summary (Last 24 hours) at 11/16/2023 2210 Last data filed at 11/16/2023 1900 Gross per 24 hour  Intake 1249.18 ml  Output 1500 ml  Net -250.82 ml    Net IO Since Admission: -981.02 mL [11/16/23 2210]   Lipase and Crt have stabilized. Mg, PHOS, K+ repletion required on 1/26 and all WNL.  Labs: Na+ 136>132>131 (L) Lipase 666--->56 (H) CBGs 81-105 over 48 hours A1c 5.5 (02/2023) CRP 14.3(H) Crt 1.54>1.48  Meds: gabapentin, thiamine, lactated ringers   NUTRITION - FOCUSED PHYSICAL EXAM:  Diet Order:   Diet Order             Diet NPO time specified Except for: Other (See Comments)  Diet effective now             EDUCATION NEEDS:  No education needs have been identified at this time  Skin:  Skin Assessment: Reviewed RN Assessment  Last BM:  1/20  Height:  Ht Readings from Last 1 Encounters:  11/08/23 6\' 1"  (1.854 m)    Weight:  Wt Readings from Last 1 Encounters:  11/16/23 100.1 kg    BMI:  Body mass index is 29.12 kg/m.  Estimated Nutritional Needs:   Kcal:  2200-2400kcal  Protein:  110-120g  Fluid:  >2L/day  Myrtie Cruise MS, RD, LDN Registered Dietitian Clinical Nutrition RD Inpatient Contact Info in Amion

## 2023-11-16 NOTE — Progress Notes (Addendum)
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 Taking narcotics q 4 hours. No shortness of breath.   Inpatient Medications    Scheduled Meds:  atorvastatin  20 mg Oral Daily   Chlorhexidine Gluconate Cloth  6 each Topical Daily   cilostazol  100 mg Oral Daily   fluticasone furoate-vilanterol  1 puff Inhalation Daily   gabapentin  100 mg Oral BID   gabapentin  300 mg Oral BID   guaiFENesin  1,200 mg Oral BID   heparin injection (subcutaneous)  5,000 Units Subcutaneous Q8H   ipratropium  0.5 mg Nebulization BID   levalbuterol  0.63 mg Nebulization BID   sodium chloride flush  3 mL Intravenous Q12H   tamsulosin  0.8 mg Oral QPM   Continuous Infusions:  lactated ringers 50 mL/hr at 11/15/23 1324   PRN Meds: acetaminophen, dextrose, hydrALAZINE, HYDROmorphone (DILAUDID) injection, ondansetron (ZOFRAN) IV, oxyCODONE, sodium chloride flush   Vital Signs    Vitals:   11/15/23 2353 11/16/23 0451 11/16/23 0746 11/16/23 0747  BP: (!) 115/46 (!) 148/59 (!) 143/52 (!) 125/58  Pulse: (!)  101 (!) 101  100  Resp: 16 17  16   Temp: 98.4 F (36.9 C) 98.1 F (36.7 C) 98 F (36.7 C) 97.9 F (36.6 C)  TempSrc: Oral Oral Oral Oral  SpO2: 97% 94%  96%  Weight:  100.1 kg    Height:        Intake/Output Summary (Last 24 hours) at 11/16/2023 0848 Last data filed at 11/16/2023 0631 Gross per 24 hour  Intake 883.7 ml  Output 2500 ml  Net -1616.3 ml   Filed Weights   11/14/23 0610 11/15/23 0449 11/16/23 0451  Weight: 102.3 kg 102.5 kg 100.1 kg    Physical Exam    GEN: Well nourished, well developed, obese HEENT: Grossly normal.  Neck: Supple, no JVD, Cardiac: RRR, no murmurs, rubs, or gallops. No clubbing, cyanosis, edema.   Respiratory:  Respirations regular and unlabored, clear to auscultation bilaterally. GI: abdominal distension and tenderness to palpation MS: no deformity or atrophy. Skin: warm and dry, no rash. Groin/radial/subclvian site stable. Subclavian site with ecchymoses and swelling that is much improved from yesterday Neuro:  Strength and sensation are intact. Psych: AAOx3.  Normal affect.  Labs    CBC Recent Labs    11/15/23 0336 11/15/23 1458 11/16/23 0400  WBC 7.5 7.6 7.2  NEUTROABS 6.0  --  5.8  HGB 12.9* 13.3 12.7*  HCT 39.2 39.7 39.0  MCV 95.1 93.9 94.9  PLT 186 223 218   Basic Metabolic Panel Recent Labs    16/10/96  0336 11/16/23 0400  NA 132* 131*  K 4.2 4.4  CL 103 101  CO2 20* 20*  GLUCOSE 105* 81  BUN 18 16  CREATININE 1.54* 1.48*  CALCIUM 8.5* 8.4*  MG 1.9 1.7  PHOS 2.6 2.5   Liver Function Tests Recent Labs    11/15/23 0336 11/16/23 0400  AST 33 36  ALT 17 20  ALKPHOS 56 68  BILITOT 1.2 1.2  PROT 5.9* 5.8*  ALBUMIN 2.3* 2.2*   Recent Labs    11/15/23 0336 11/16/23 0400  LIPASE 62* 56*    Telemetry    NSR HRs in 90s, short run of SVT  - Personally Reviewed  ECG    Sinus with inferior anteroseptal q waves, 86 bpm. - Personally Reviewed   Cardiac Studies   TAVR OPERATIVE NOTE     Date of  Procedure:                11/08/2023   Preoperative Diagnosis:      Severe Aortic Stenosis    Postoperative Diagnosis:    Same    Procedure:        Transcatheter Aortic Valve Replacement - Open left subclavian Approach             Edwards Sapien 3 Ultra Resilia THV (size 26 mm, serial # [id number])              Co-Surgeons:                        Evelene Croon, MD and Tonny Bollman, MD   Anesthesiologist:                  Corky Sox, MD   Echocardiographer:              Thurmon Fair, MD   Pre-operative Echo Findings: Severe aortic stenosis Normal left ventricular systolic function   Post-operative Echo Findings: No paravalvular leak Normal left ventricular systolic function _____________     Echo 11/09/23: IMPRESSIONS   1. Left ventricular ejection fraction, by estimation, is 65 to 70%. The  left ventricle has normal function. The left ventricle has no regional  wall motion abnormalities. There is mild left ventricular hypertrophy.  Left ventricular diastolic parameters  are indeterminate.   2. Right ventricular systolic function is normal. The right ventricular  size is normal. Tricuspid regurgitation signal is inadequate for assessing  PA pressure.   3. The mitral valve is normal in structure. Trivial mitral valve  regurgitation.   4. The aortic valve has been repaired/replaced. Aortic valve  regurgitation is not visualized. There is a 26 mm Edwards Sapien  prosthetic, stented (TAVR) valve present in the aortic      Procedure Date: 11/08/23. Echo findings are consistent with normal  structure and function of the aortic valve prosthesis. Vmax 1.6 m/s, MG  , EOA 2.1 cm^2, DI 0.74   Patient Profile     Tony Zhang is a 78 y.o. 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   Assessment & Plan    Severe AS: s/p  successful TAVR with a 26 mm Edwards Sapien 3 Ultra Resilia THV via the TF approach on 11/09/23. Post operative echo showed EF 65%, normally functioning TAVR with a mean gradient of 5 mmHg and no PVL. Subclavian seroma  vs hematoma likely related to full dose heparin gtt. This has not been switched to DVT prophylaxis dosing. Hg stable and CT chest reassuring. Appreciate Dr. Lucilla Lame help.    Symptomatic hypotension: pt had episodic hypotension that responded to IV fluid boluses.  Echo with normal LV/RV/TAVR function and no effusion. Likely due to hypotension in setting of pancreatitis.  Resolved with IV fluids. BP 128/58 today   Acute on chronic HFpEF: as evidenced by an elevated LVEDP ~28 mm hg at the time of TAVR. Treated with one dose of IV lasix 40mg /KDur on 1/22. Lasix, Jardiance & Toprol-XL held given AKI and hypotension. Thankfully, has not had any s/s of acute CHF. BNP 255.   Sinus tach with short run of SVT: home dose of Toprol XL 100mg  daily has been on hold. Likely reactive in the setting of pain. HRs in 90s today.    AKI with baseline CKD stage IIIb: baseline creat 1.5-2.4. Creat peaked at 3.8. Likely multifactorial with contrast exposure during TAVR, hypotension and poor PO intake. Resolved with IV fluids. Creatinine down to 1.48 today, which is his baseline.    Acute gallstone pancreatitis: imaging was obtained and CT scan showed acute pancreatitis as well as gallstones. Ultrasound showed difficult visualization but dilated gallbladder without obvious stones.  MRCP showed acute pancreatitis and cholelithiasis without acute cholecystitis. Seen by surgery who may do cholecystectomy depending on clinical status. He is still having signigicant abdominal pain requiring scheduled narcotics. He is cleared for surgery from our standpoint.    Acute urinary retention: oliguria and retention. Now s/p foley. Flomax increased.   Hyponatremia: Na 131. Continue to monitor.    CAD: s/p DES to  LAD 2023. Pre op [facility] 09/28/23 showed severe calcified stenosis of the RCA unchanged from prior in 2023, patent mid LAD stent with moderate nonobstructive disease proximally, patent left circumflex, and patent subclavian with mild stenosis. Plan for medical management of CAD. Continue on atorvastatin 40 mg daily, Repatha 140mg  q 2weeks. No aspirin given chronic OAC.    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 Continue medical management with Pletal 100mg  daily, atorvastatin 40 mg daily, Repatha 140mg  q 2weeks. No aspirin given chronic OAC.   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    DVT/PE: resumed on home Coumadin, but now discontinued for potential cholecystectomy. Heparin for DVT prophylaxis   Previous RLL filling defect in bronchus: possibly mucus plugging. Repeat chest CT chest was "reassuring that the previously seen endobronchial lesion was benign. 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   Byrd Hesselbach, PA-C  11/16/2023, 8:48 AM  Pager [phone]  I have personally seen and examined this patient. I agree with the assessment and plan as outlined above. Tony Zhang post TAVR and his hospitalization has been complicated by acute gallstone pancreatitis. Surgery team following and considering surgery later this week. He has swelling of his TAVR access on the left chest wall felt to be due to seroma vs hematoma while on IV heparin. He has been seen by the CT surgery team and this is not felt to be active bleeding from the subclavian artery. Heparin has been reduced to Q8 hours at 5000 units.  He complains of abdominal pain today. He is NPO. No bowel movement over the past several days. He has urinary retention and  foley catheter is in place.  My exam: Obese male, NAD.  CV:RRR Lungs: clear Abd: TTP. Ext: No LE edema  Plan: Severe AS s/p TAVR. Normally functioning valve on post op echo. Will follow surgical wound closely. Appreciate input of surgery team.  Chronic diastolic CHF: No signs of volume overload on exam Chronic DVT/PE: Coumadin on hold awaiting surgery. IV heparin stopped due to wound hematoma.  Gallstone pancreatitis: Appreciate input of IM team and Surgery team. NPO. Planning for Cortrak placement today and tube feedings. Awaiting optimal timing for surgery.   Verne Carrow, MD, [facility] 11/16/2023 12:02 PM

## 2023-11-16 NOTE — Progress Notes (Addendum)
[facility] PROGRESS NOTE   Tony Zhang:[id number] DOB: 05/10/1946 DOA: 11/08/2023  PCP: System, Provider Not In  Brief History: 78 y.o. male with past medical history of hypertension, dyslipidemia, CAD, severe aortic stenosis, PVD, history of recurrent venous thrombosis, presented for transcatheter aortic valve replacement.  Following the procedure performed on 1/21 patient reported developing severe abdominal pain.  He was found to have pancreatitis.  Then he required transfer to ICU due to hypotension.  He was stabilized and then transferred back to the floor.  Cardiology and general surgery continues to follow.  There was also concern for a subclavian site hematoma which seems to be stable.  He was seen by cardiothoracic surgery as well.    Consultants: General surgery.  Cardiology.  Cardiothoracic surgery.     Subjective/Interval History: Patient continues to have pain in the abdomen.  Some nausea but no vomiting in the last 12 hours.  Not passing much gas from below.  Oral intake remains poor.    Assessment/Plan:  Acute Pancreatitis CT scan showed acute pancreatitis.  Evidence for cholelithiasis.  Patient also underwent ultrasound.  Lipase level was initially in the 600s.  Improvement noted. Underwent MRI/MRCP which showed again cholelithiasis without cholecystitis.  No choledocholithiasis noted. General surgery is following.  Initially plan was for surgery in the future however cardiology thinks he will be low risk.  So they are considering operating during this hospital stay though he is not ready for surgery yet. Oral intake remains poor.  Plan is for core track tube placement today.   Acute Respiratory Failure with Hypoxia Seems to be stable.  Still requiring about 5 L of oxygen via nasal cannula. Imaging studies showed emphysema with chronic lung changes.   Continue with nebulizer treatments and inhalers.  Continue with Flutter Valve, Incentive Spirometry, and  Guaifenesin 1200 mg po BID   Transient Hypotension, improved -In the setting of Hypovolemia after Furosemide and Metoprolol Administration as well as having poor Po Intake Blood pressures have stabilized.  Continue to monitor.  He is not noted to be on any blood pressure lowering agents at this time.   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  Cardiology follows closely. -Continue to hold Metoprolol, Jardiance   Acute Urinary Retention Continue with Foley catheter.  Voiding trial can be attempted once more ambulatory. -C/w Tamsulosin 0.8 mg po at bedtime for now   Hematuria -Has a Hx of Prostatomegaly and foley had been placed this Admission -Worsened in the setting of [facility] -Discussed with Urology Dr. Berneice Heinrich who recommends no CBI at this point and no CT Hematuria Study to be done -Dr. Berneice Heinrich recommends Supportive Care  Now he is off of anticoagulations hematuria should improve.  Severe Aortic Stenosis -Status post TAVR during this admission. Cardiology following and managing.  Off of anticoagulation due to subclavian site hematoma.   Previous RLL filling defect in bronchus -Possibly mucus plugging.    Subclavian site hematoma -Worsened in the setting of his heparin drip Seen by thoracic surgery.  No intervention planned.  Anticoagulation has been discontinued.   Aortic Arch Aneursym -Was 4.1 cm and stable for 1 year in the 2024 F/U -C/w Outpatient Monitoring and Follow Up   Hx of DVT -Anticoagulation with Coumadin resumed back to heparin drip given anticipated surgical intervention  and now heparin drip has been held given his subclavian site hematoma and hematuria   Hypomagnesemia/hypophosphatemia/hyponatremia Continue to monitor   AKI on CKD Stage 3b, improving and  stable Metabolic Acidosis, mild Renal function has improved and stable for the most part.  Monitor urine output.  Avoid nephrotoxic agents.   DVT Prophylaxis: Subcutaneous heparin Code Status: Full code Family Communication: Discussed with patient Disposition Plan: To be determined     Medications: Scheduled:  atorvastatin  20 mg Oral Daily   Chlorhexidine Gluconate Cloth  6 each Topical Daily   cilostazol  100 mg Oral Daily   fluticasone furoate-vilanterol  1 puff Inhalation Daily   gabapentin  100 mg Oral BID   gabapentin  300 mg Oral BID   guaiFENesin  1,200 mg Oral BID   heparin injection (subcutaneous)  5,000 Units Subcutaneous Q8H   ipratropium  0.5 mg Nebulization BID   levalbuterol  0.63 mg Nebulization BID   sodium chloride flush  3 mL Intravenous Q12H   tamsulosin  0.8 mg Oral QPM   Continuous:  lactated ringers 50 mL/hr at 11/16/23 0913   QMV:[id number], dextrose, hydrALAZINE, HYDROmorphone (DILAUDID) injection, ondansetron (ZOFRAN) IV, oxyCODONE, sodium chloride flush  Antibiotics: Anti-infectives (From admission, onward)    Start     Dose/Rate Route Frequency Ordered Stop   11/08/23 2100  ceFAZolin (ANCEF) IVPB 2g/100 mL premix        2 g 200 mL/hr over 30 Minutes Intravenous Every 8 hours 11/08/23 1647 11/09/23 0548   11/08/23 0400  ceFAZolin (ANCEF) IVPB 2g/100 mL premix  Status:  Discontinued        2 g 200 mL/hr over 30 Minutes Intravenous To Surgery 11/07/23 1323 11/08/23 1514       Objective:  Vital Signs  Vitals:   11/16/23 0451 11/16/23 0746 11/16/23 0747 11/16/23 0903  BP: (!) 148/59 (!) 143/52 (!) 125/58 94/80  Pulse: (!) 101  100 (!) 108  Resp: 17  16 18   Temp: 98.1 F (36.7 C) 98 F (36.7 C) 97.9 F (36.6 C)   TempSrc: Oral Oral Oral   SpO2: 94%  96% 93%  Weight: 100.1 kg     Height:        Intake/Output Summary (Last 24 hours) at 11/16/2023 1106 Last data filed at 11/16/2023 0631 Gross per 24 hour  Intake 883.7 ml  Output  2500 ml  Net -1616.3 ml   Filed Weights   11/14/23 0610 11/15/23 0449 11/16/23 0451  Weight: 102.3 kg 102.5 kg 100.1 kg    General appearance: Awake alert.  In no distress Resp: Clear to auscultation bilaterally.  Normal effort Cardio: S1-S2 is normal regular.  No S3-S4.  No rubs murmurs or bruit GI: Abdomen is soft.  Diffusely tender without any rebound rigidity or guarding.  Bowel sounds sluggish Extremities: No edema.  Physical deconditioning is noted Neurologic:   No focal neurological deficits.    Lab Results:  Data Reviewed: I have personally reviewed following labs and reports of the imaging studies  CBC: Recent Labs  Lab 11/13/23 0355 11/14/23 0300 11/15/23 0336 11/15/23 1458 11/16/23 0400  WBC 8.8 7.2 7.5 7.6 7.2  NEUTROABS 7.6 5.9 6.0  --  5.8  HGB 13.1 12.6* 12.9* 13.3 12.7*  HCT 39.4 38.7* 39.2 39.7 39.0  MCV 94.7 95.3 95.1 93.9 94.9  PLT 177 187 186 223 218    Basic Metabolic Panel: Recent Labs  Lab 11/12/23 0320 11/13/23 0355 11/14/23 0300 11/15/23 0336 11/16/23 0400  NA  134* 135 136 132* 131*  K 4.0 4.5 4.4 4.2 4.4  CL 106 105 106 103 101  CO2 20* 21* 21* 20* 20*  GLUCOSE 70 83 112* 105* 81  BUN 41* 29* 23 18 16   CREATININE 2.17* 1.54* 1.55* 1.54* 1.48*  CALCIUM 7.8* 8.1* 8.3* 8.5* 8.4*  MG 1.4* 1.6* 2.2 1.9 1.7  PHOS 3.0 2.4* 2.8 2.6 2.5    GFR: Estimated Creatinine Clearance: 51.2 mL/min (A) (by C-G formula based on SCr of 1.48 mg/dL (H)).  Liver Function Tests: Recent Labs  Lab 11/12/23 0320 11/13/23 0355 11/14/23 0300 11/15/23 0336 11/16/23 0400  AST 21 44*  37 25 33 36  ALT 10 11  10 13 17 20   ALKPHOS 40 50  47 51 56 68  BILITOT 1.1 1.1  1.0 0.8 1.2 1.2  PROT 5.5* 5.8*  5.7* 5.7* 5.9* 5.8*  ALBUMIN 2.3* 2.3*  2.3* 2.3* 2.3* 2.2*    Recent Labs  Lab 11/10/23 1236 11/11/23 0325 11/12/23 0320 11/13/23 0355 11/14/23 0300 11/15/23 0336 11/16/23 0400  LIPASE 666*   < > 72* 69* 71* 62* 56*  AMYLASE 495*  --   --    --   --   --   --    < > = values in this interval not displayed.    Coagulation Profile: Recent Labs  Lab 11/12/23 0320 11/13/23 0355 11/14/23 0300 11/15/23 0336 11/16/23 0400  INR 1.4* 1.4* 1.7* 2.0* 1.9*    CBG: Recent Labs  Lab 11/15/23 1158 11/15/23 1707 11/15/23 2039 11/15/23 2359 11/16/23 0502  GLUCAP 81 75 104* 79 87    Radiology Studies: DG CHEST PORT 1 VIEW Result Date: 11/16/2023 CLINICAL DATA:  Shortness of breath. EXAM: PORTABLE CHEST 1 VIEW COMPARISON:  Chest radiograph dated 11/14/2023 and CT dated 11/15/2023. FINDINGS: Background of emphysema. Blunting of the costophrenic angles most consistent with pleural scarring. No new consolidation. No pneumothorax. The cardiac silhouette is within limits. Atherosclerotic calcification of the aortic arch. No acute osseous pathology. IMPRESSION: 1. No active disease. 2. Emphysema. Electronically Signed   By: Elgie Collard M.D.   On: 11/16/2023 10:30   CT CHEST W CONTRAST Result Date: 11/15/2023 CLINICAL DATA:  Chest wall pain, nontraumatic, history of chest intervention, xray done acute chest hematoma Electronic records indicates history of trans catheter aortic valve replacement, left subclavian, 1 week ago. EXAM: CT CHEST WITH CONTRAST TECHNIQUE: Multidetector CT imaging of the chest was performed during intravenous contrast administration. RADIATION DOSE REDUCTION: This exam was performed according to the departmental dose-optimization program which includes automated exposure control, adjustment of the mA and/or kV according to patient size and/or use of iterative reconstruction technique. CONTRAST:  50mL OMNIPAQUE IOHEXOL 350 MG/ML SOLN COMPARISON:  Chest CT 11/11/2025, 3 days ago FINDINGS: Cardiovascular: The heart is normal in size. TAVR. Stable ascending aorta at 4 cm. Aortic atherosclerosis. There is no periaortic stranding. Normal caliber of the main pulmonary artery. No pericardial effusion. There is narrowing of the  left subclavian vein as it courses under the clavicle, series 3, image 32. No evidence of intraluminal filling defect. Mediastinum/Nodes: Subcentimeter short axis mediastinal lymph nodes are unchanged from prior exam. No suspicious mediastinal adenopathy. Decompressed esophagus. Tiny hiatal hernia. Hilar assessment is limited due to phase of IV contrast. Lungs/Pleura: Moderate emphysema. Right apical and upper lobe scarring. Minimal peripheral scarring in the left lower lobe laterally. Mild bronchial thickening. 7 mm perifissural lymph node in the right lung series 5, image 79, minimally increased in  size. Stable 3 mm left lower lobe nodule series 5, image 111. Trace left pleural effusions/thickening. No features of pulmonary edema. Upper Abdomen: Mild hepatic steatosis. Cyst in the subcapsular right lobe of the liver. There is peripancreatic fat stranding diffusely. This is not significantly changed from 11/10/2023 abdominal CT. Bilateral renal cysts. No further follow-up imaging is recommended. Musculoskeletal: There is left supraclavicular stranding extending into the pectoralis musculature. Stranding does not appear to be centered on the subclavian vessels. There is increasing fluid in the left supraclavicular soft tissues which is only partially included in the field of view, for example series 3 images 1-7. There is stranding of the subcutaneous tissues in the midline chest. Mild glenohumeral osteoarthritis of both shoulders mild thoracic spondylosis. IMPRESSION: 1. Fluid in the left supraclavicular soft tissues only partially included in the field of view. This is ill-defined without peripheral enhancement. There is also mild stranding of the left pectoralis musculature. 2. Trace left pleural thickening/effusion. No other acute intrathoracic abnormality. 3. Findings of pancreatitis, without significant change from recent abdominal CT. 4. TAVR.  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 Aortic  Atherosclerosis (ICD10-I70.0) and Emphysema (ICD10-J43.9). Electronically Signed   By: Narda Rutherford M.D.   On: 11/15/2023 17:25       LOS: 8 days   Alley Neils Rito Ehrlich  [facility] Pager on [email or web address]  11/16/2023, 11:06 AM

## 2023-11-16 NOTE — Plan of Care (Signed)
Problem: Education: Goal: Knowledge of General Education information will improve Description: Including pain rating scale, medication(s)/side effects and non-pharmacologic comfort measures Outcome: Progressing   Problem: Coping: Goal: Level of anxiety will decrease Outcome: Progressing

## 2023-11-16 NOTE — Procedures (Signed)
Cortrak  Person Inserting Tube:  Osa Craver, RD Tube Type:  Cortrak - 43 inches Tube Size:  10 Tube Location:  Left nare Secured by: Bridle Technique Used to Measure Tube Placement:  Marking at nare/corner of mouth Cortrak Secured At:  98 cm  Cortrak Tube Team Note:  Consult received to place a Cortrak feeding tube.   X-ray is required, abdominal x-ray has been ordered by the Cortrak team. Please confirm tube placement before using the Cortrak tube.   If the tube becomes dislodged please keep the tube and contact the Cortrak team at [email or web address] for replacement.  If after hours and replacement cannot be delayed, place a NG tube and confirm placement with an abdominal x-ray.    Romelle Starcher MS, RDN, LDN, CNSC Registered Dietitian 3 Clinical Nutrition RD Inpatient Contact Info in Amion

## 2023-11-16 NOTE — Progress Notes (Signed)
8 Days Post-Op  Subjective: Pt reports still having epigastric abdominal pain and requiring pain medication. He is not passing much flatus and no BM in several days. Some nausea and not much appetite.   Objective: Vital signs in last 24 hours: Temp:  [97.9 F (36.6 C)-98.4 F (36.9 C)] 97.9 F (36.6 C) (01/29 0747) Pulse Rate:  [99-108] 108 (01/29 0903) Resp:  [15-21] 18 (01/29 0903) BP: (94-154)/(41-80) 94/80 (01/29 0903) SpO2:  [78 %-97 %] 93 % (01/29 0903) Weight:  [100.1 kg] 100.1 kg (01/29 0451) Last BM Date : 11/07/23  Intake/Output from previous day: 01/28 0701 - 01/29 0700 In: 883.7 [I.V.:883.7] Out: 2500 [Urine:2500] Intake/Output this shift: No intake/output data recorded.  PE: Gen:  Alert, NAD, pleasant Abd: Soft, at least mild distension, epigastric and periumbilical fullness and ttp with some voluntary guarding  Lab Results:  Recent Labs    11/15/23 1458 11/16/23 0400  WBC 7.6 7.2  HGB 13.3 12.7*  HCT 39.7 39.0  PLT 223 218   BMET Recent Labs    11/15/23 0336 11/16/23 0400  NA 132* 131*  K 4.2 4.4  CL 103 101  CO2 20* 20*  GLUCOSE 105* 81  BUN 18 16  CREATININE 1.54* 1.48*  CALCIUM 8.5* 8.4*   PT/INR Recent Labs    11/15/23 0336 11/16/23 0400  LABPROT 22.6* 22.1*  INR 2.0* 1.9*   CMP     Component Value Date/Time   NA 131 (L) 11/16/2023 0400   NA 140 10/05/2023 1010   K 4.4 11/16/2023 0400   CL 101 11/16/2023 0400   CO2 20 (L) 11/16/2023 0400   GLUCOSE 81 11/16/2023 0400   BUN 16 11/16/2023 0400   BUN 24 10/05/2023 1010   CREATININE 1.48 (H) 11/16/2023 0400   CREATININE 1.56 (H) 09/17/2013 1135   CALCIUM 8.4 (L) 11/16/2023 0400   PROT 5.8 (L) 11/16/2023 0400   PROT 7.2 09/22/2023 0947   ALBUMIN 2.2 (L) 11/16/2023 0400   ALBUMIN 4.4 09/22/2023 0947   AST 36 11/16/2023 0400   ALT 20 11/16/2023 0400   ALKPHOS 68 11/16/2023 0400   BILITOT 1.2 11/16/2023 0400   BILITOT 0.7 09/22/2023 0947   GFRNONAA 48 (L) 11/16/2023  0400   GFRAA >60 08/14/2019 0429   Lipase     Component Value Date/Time   LIPASE 56 (H) 11/16/2023 0400    Studies/Results: CT CHEST W CONTRAST Result Date: 11/15/2023 CLINICAL DATA:  Chest wall pain, nontraumatic, history of chest intervention, xray done acute chest hematoma Electronic records indicates history of trans catheter aortic valve replacement, left subclavian, 1 week ago. EXAM: CT CHEST WITH CONTRAST TECHNIQUE: Multidetector CT imaging of the chest was performed during intravenous contrast administration. RADIATION DOSE REDUCTION: This exam was performed according to the departmental dose-optimization program which includes automated exposure control, adjustment of the mA and/or kV according to patient size and/or use of iterative reconstruction technique. CONTRAST:  50mL OMNIPAQUE IOHEXOL 350 MG/ML SOLN COMPARISON:  Chest CT 11/11/2025, 3 days ago FINDINGS: Cardiovascular: The heart is normal in size. TAVR. Stable ascending aorta at 4 cm. Aortic atherosclerosis. There is no periaortic stranding. Normal caliber of the main pulmonary artery. No pericardial effusion. There is narrowing of the left subclavian vein as it courses under the clavicle, series 3, image 32. No evidence of intraluminal filling defect. Mediastinum/Nodes: Subcentimeter short axis mediastinal lymph nodes are unchanged from prior exam. No suspicious mediastinal adenopathy. Decompressed esophagus. Tiny hiatal hernia. Hilar assessment is limited due to  phase of IV contrast. Lungs/Pleura: Moderate emphysema. Right apical and upper lobe scarring. Minimal peripheral scarring in the left lower lobe laterally. Mild bronchial thickening. 7 mm perifissural lymph node in the right lung series 5, image 79, minimally increased in size. Stable 3 mm left lower lobe nodule series 5, image 111. Trace left pleural effusions/thickening. No features of pulmonary edema. Upper Abdomen: Mild hepatic steatosis. Cyst in the subcapsular right lobe  of the liver. There is peripancreatic fat stranding diffusely. This is not significantly changed from 11/10/2023 abdominal CT. Bilateral renal cysts. No further follow-up imaging is recommended. Musculoskeletal: There is left supraclavicular stranding extending into the pectoralis musculature. Stranding does not appear to be centered on the subclavian vessels. There is increasing fluid in the left supraclavicular soft tissues which is only partially included in the field of view, for example series 3 images 1-7. There is stranding of the subcutaneous tissues in the midline chest. Mild glenohumeral osteoarthritis of both shoulders mild thoracic spondylosis. IMPRESSION: 1. Fluid in the left supraclavicular soft tissues only partially included in the field of view. This is ill-defined without peripheral enhancement. There is also mild stranding of the left pectoralis musculature. 2. Trace left pleural thickening/effusion. No other acute intrathoracic abnormality. 3. Findings of pancreatitis, without significant change from recent abdominal CT. 4. TAVR.  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 Aortic Atherosclerosis (ICD10-I70.0) and Emphysema (ICD10-J43.9). Electronically Signed   By: Narda Rutherford M.D.   On: 11/15/2023 17:25    Anti-infectives: Anti-infectives (From admission, onward)    Start     Dose/Rate Route Frequency Ordered Stop   11/08/23 2100  ceFAZolin (ANCEF) IVPB 2g/100 mL premix        2 g 200 mL/hr over 30 Minutes Intravenous Every 8 hours 11/08/23 1647 11/09/23 0548   11/08/23 0400  ceFAZolin (ANCEF) IVPB 2g/100 mL premix  Status:  Discontinued        2 g 200 mL/hr over 30 Minutes Intravenous To Surgery 11/07/23 1323 11/08/23 1514        Assessment/Plan ZAC TORTI is an 78 y.o. 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   - Previously discussed cardiac risk with primary  team, cardiology, given recent TAVR.  Although ideally the timeframe is 1 month post TAVR for elective surgery, given the patient is quite symptomatic, cardiology states low enough risk that can consider surgery during this hospitalization. We will see how patient progresses and determine if appropriate for surgery during hospitalization. - Hold Coumadin, okay for heparin drip - currently on hold with chest wall hematoma  - still with significant TTP on exam despite improving labs - not ready for cholecystectomy at this time - some nausea and not much bowel function - monitor for signs of worsening ileus  - ok to attempt Cortrak placement and PP TF - if unable to tolerate this may need to consider PICC and TPN - We will follow with you  FEN - NPO, IVF per primary  VTE - SCDs, Heparin gtt held, SQH ID - None currently.   I reviewed nursing notes, Consultant TRH, TCTS notes, last 24 h vitals and pain scores, last 48 h intake and output, last 24 h labs and trends, last 24 h imaging results, and cardiology notes .   This required moderate level of medical decision making.   LOS: 8 days    Juliet Rude , [facility]  11/16/2023, 10:30 AM Please see Amion for pager number during day hours 7:00am-4:30pm

## 2023-11-17 DIAGNOSIS — I5032 Chronic diastolic (congestive) heart failure: Secondary | ICD-10-CM | POA: Diagnosis not present

## 2023-11-17 DIAGNOSIS — Z952 Presence of prosthetic heart valve: Secondary | ICD-10-CM | POA: Diagnosis not present

## 2023-11-17 DIAGNOSIS — J9601 Acute respiratory failure with hypoxia: Secondary | ICD-10-CM | POA: Diagnosis not present

## 2023-11-17 DIAGNOSIS — K851 Biliary acute pancreatitis without necrosis or infection: Secondary | ICD-10-CM | POA: Diagnosis not present

## 2023-11-17 LAB — COMPREHENSIVE METABOLIC PANEL
ALT: 23 U/L (ref 0–44)
AST: 37 U/L (ref 15–41)
Albumin: 2.4 g/dL — ABNORMAL LOW (ref 3.5–5.0)
Alkaline Phosphatase: 73 U/L (ref 38–126)
Anion gap: 14 (ref 5–15)
BUN: 20 mg/dL (ref 8–23)
CO2: 19 mmol/L — ABNORMAL LOW (ref 22–32)
Calcium: 8.5 mg/dL — ABNORMAL LOW (ref 8.9–10.3)
Chloride: 102 mmol/L (ref 98–111)
Creatinine, Ser: 1.41 mg/dL — ABNORMAL HIGH (ref 0.61–1.24)
GFR, Estimated: 51 mL/min — ABNORMAL LOW (ref >60)
Glucose, Bld: 122 mg/dL — ABNORMAL HIGH (ref 70–99)
Potassium: 4.3 mmol/L (ref 3.5–5.1)
Sodium: 135 mmol/L (ref 135–145)
Total Bilirubin: 1.1 mg/dL (ref 0.0–1.2)
Total Protein: 6.3 g/dL — ABNORMAL LOW (ref 6.5–8.1)

## 2023-11-17 LAB — GLUCOSE, CAPILLARY
Glucose-Capillary: 118 mg/dL — ABNORMAL HIGH (ref 70–99)
Glucose-Capillary: 124 mg/dL — ABNORMAL HIGH (ref 70–99)
Glucose-Capillary: 146 mg/dL — ABNORMAL HIGH (ref 70–99)
Glucose-Capillary: 121 mg/dL — ABNORMAL HIGH (ref 70–99)
Glucose-Capillary: 134 mg/dL — ABNORMAL HIGH (ref 70–99)

## 2023-11-17 LAB — PHOSPHORUS
Phosphorus: 2.6 mg/dL (ref 2.5–4.6)
Phosphorus: 2.4 mg/dL — ABNORMAL LOW (ref 2.5–4.6)

## 2023-11-17 LAB — CBC
HCT: 39.8 % (ref 39.0–52.0)
Hemoglobin: 12.9 g/dL — ABNORMAL LOW (ref 13.0–17.0)
MCH: 30.9 pg (ref 26.0–34.0)
MCHC: 32.4 g/dL (ref 30.0–36.0)
MCV: 95.2 fL (ref 80.0–100.0)
Platelets: 265 10*3/uL (ref 150–400)
RBC: 4.18 MIL/uL — ABNORMAL LOW (ref 4.22–5.81)
RDW: 15.1 % (ref 11.5–15.5)
WBC: 8.6 10*3/uL (ref 4.0–10.5)
nRBC: 0 % (ref 0.0–0.2)

## 2023-11-17 LAB — MAGNESIUM
Magnesium: 1.7 mg/dL (ref 1.7–2.4)
Magnesium: 1.7 mg/dL (ref 1.7–2.4)

## 2023-11-17 LAB — LIPASE, BLOOD: Lipase: 54 U/L — ABNORMAL HIGH (ref 11–51)

## 2023-11-17 MED ORDER — POLYETHYLENE GLYCOL 3350 17 G PO PACK
17.0000 g | PACK | Freq: Every day | ORAL | Status: DC
  Administered 2023-11-17 – 2023-11-19 (×3): 17 g
  Filled 2023-11-17 (×5): qty 1

## 2023-11-17 MED ORDER — ACETAMINOPHEN 160 MG/5ML PO SOLN
1000.0000 mg | Freq: Four times a day (QID) | ORAL | Status: DC | PRN
  Administered 2023-11-18: 1000 mg
  Filled 2023-11-17: qty 40.6

## 2023-11-17 MED ORDER — OXYCODONE HCL 5 MG PO TABS
5.0000 mg | ORAL_TABLET | ORAL | Status: DC | PRN
  Administered 2023-11-17 – 2023-11-24 (×18): 5 mg
  Filled 2023-11-17 (×18): qty 1

## 2023-11-17 MED ORDER — GABAPENTIN 250 MG/5ML PO SOLN
400.0000 mg | Freq: Two times a day (BID) | ORAL | Status: DC
  Administered 2023-11-17 – 2023-11-23 (×14): 400 mg
  Filled 2023-11-17 (×17): qty 8

## 2023-11-17 MED ORDER — CILOSTAZOL 100 MG PO TABS
100.0000 mg | ORAL_TABLET | Freq: Every day | ORAL | Status: DC
  Administered 2023-11-18 – 2023-11-19 (×2): 100 mg
  Filled 2023-11-17 (×2): qty 1

## 2023-11-17 MED ORDER — GUAIFENESIN 100 MG/5ML PO LIQD: 10.0000 mL | ORAL | Status: DC | PRN

## 2023-11-17 MED ORDER — DOCUSATE SODIUM 50 MG/5ML PO LIQD
100.0000 mg | Freq: Two times a day (BID) | ORAL | Status: DC
  Administered 2023-11-17 – 2023-11-22 (×8): 100 mg
  Filled 2023-11-17 (×13): qty 10

## 2023-11-17 MED ORDER — ATORVASTATIN CALCIUM 10 MG PO TABS
20.0000 mg | ORAL_TABLET | Freq: Every day | ORAL | Status: DC
  Administered 2023-11-17 – 2023-11-24 (×8): 20 mg
  Filled 2023-11-17 (×9): qty 2

## 2023-11-17 MED ORDER — METOPROLOL SUCCINATE ER 25 MG PO TB24: 25.0000 mg | ORAL_TABLET | Freq: Every day | ORAL | Status: DC

## 2023-11-17 MED ORDER — METOPROLOL TARTRATE 12.5 MG HALF TABLET
12.5000 mg | ORAL_TABLET | Freq: Two times a day (BID) | ORAL | Status: DC
  Administered 2023-11-17 (×2): 12.5 mg
  Filled 2023-11-17 (×2): qty 1

## 2023-11-17 NOTE — Progress Notes (Signed)
9 Days Post-Op  Subjective: Pt reports stable abdominal pain today but no worse since starting trickle TF. No flatus or BM but denies nausea.   Objective: Vital signs in last 24 hours: Temp:  [97.7 F (36.5 C)-98.7 F (37.1 C)] 98.1 F (36.7 C) (01/30 0758) Pulse Rate:  [95-108] 106 (01/30 0758) Resp:  [15-20] 16 (01/30 0758) BP: (94-163)/(46-128) 150/71 (01/30 0758) SpO2:  [93 %-96 %] 96 % (01/30 0758) Weight:  [99.7 kg] 99.7 kg (01/30 0300) Last BM Date : 11/07/23  Intake/Output from previous day: 01/29 0701 - 01/30 0700 In: 1507.5 [I.V.:1249.2; NG/GT:258.3] Out: 1550 [Urine:1550] Intake/Output this shift: No intake/output data recorded.  PE: Gen:  Alert, NAD, pleasant Abd: Soft, mild to moderate distension, epigastric and periumbilical fullness and ttp with some voluntary guarding  Lab Results:  Recent Labs    11/16/23 0400 11/17/23 0804  WBC 7.2 8.6  HGB 12.7* 12.9*  HCT 39.0 39.8  PLT 218 265   BMET Recent Labs    11/15/23 0336 11/16/23 0400  NA 132* 131*  K 4.2 4.4  CL 103 101  CO2 20* 20*  GLUCOSE 105* 81  BUN 18 16  CREATININE 1.54* 1.48*  CALCIUM 8.5* 8.4*   PT/INR Recent Labs    11/15/23 0336 11/16/23 0400  LABPROT 22.6* 22.1*  INR 2.0* 1.9*   CMP     Component Value Date/Time   NA 131 (L) 11/16/2023 0400   NA 140 10/05/2023 1010   K 4.4 11/16/2023 0400   CL 101 11/16/2023 0400   CO2 20 (L) 11/16/2023 0400   GLUCOSE 81 11/16/2023 0400   BUN 16 11/16/2023 0400   BUN 24 10/05/2023 1010   CREATININE 1.48 (H) 11/16/2023 0400   CREATININE 1.56 (H) 09/17/2013 1135   CALCIUM 8.4 (L) 11/16/2023 0400   PROT 5.8 (L) 11/16/2023 0400   PROT 7.2 09/22/2023 0947   ALBUMIN 2.2 (L) 11/16/2023 0400   ALBUMIN 4.4 09/22/2023 0947   AST 36 11/16/2023 0400   ALT 20 11/16/2023 0400   ALKPHOS 68 11/16/2023 0400   BILITOT 1.2 11/16/2023 0400   BILITOT 0.7 09/22/2023 0947   GFRNONAA 48 (L) 11/16/2023 0400   GFRAA >60 08/14/2019 0429    Lipase     Component Value Date/Time   LIPASE 56 (H) 11/16/2023 0400    Studies/Results: DG Abd Portable 1V Result Date: 11/16/2023 CLINICAL DATA:  Feeding tube placement. EXAM: PORTABLE ABDOMEN - 1 VIEW COMPARISON:  CT abdomen pelvis dated 11/10/2023. FINDINGS: Feeding tube with weighted tip in the left hemiabdomen in the region of the distal duodenum. IMPRESSION: Feeding tube with tip in the distal duodenum. Electronically Signed   By: Elgie Collard M.D.   On: 11/16/2023 14:26   DG CHEST PORT 1 VIEW Result Date: 11/16/2023 CLINICAL DATA:  Shortness of breath. EXAM: PORTABLE CHEST 1 VIEW COMPARISON:  Chest radiograph dated 11/14/2023 and CT dated 11/15/2023. FINDINGS: Background of emphysema. Blunting of the costophrenic angles most consistent with pleural scarring. No new consolidation. No pneumothorax. The cardiac silhouette is within limits. Atherosclerotic calcification of the aortic arch. No acute osseous pathology. IMPRESSION: 1. No active disease. 2. Emphysema. Electronically Signed   By: Elgie Collard M.D.   On: 11/16/2023 10:30   CT CHEST W CONTRAST Result Date: 11/15/2023 CLINICAL DATA:  Chest wall pain, nontraumatic, history of chest intervention, xray done acute chest hematoma Electronic records indicates history of trans catheter aortic valve replacement, left subclavian, 1 week ago. EXAM: CT CHEST WITH  CONTRAST TECHNIQUE: Multidetector CT imaging of the chest was performed during intravenous contrast administration. RADIATION DOSE REDUCTION: This exam was performed according to the departmental dose-optimization program which includes automated exposure control, adjustment of the mA and/or kV according to patient size and/or use of iterative reconstruction technique. CONTRAST:  50mL OMNIPAQUE IOHEXOL 350 MG/ML SOLN COMPARISON:  Chest CT 11/11/2025, 3 days ago FINDINGS: Cardiovascular: The heart is normal in size. TAVR. Stable ascending aorta at 4 cm. Aortic atherosclerosis.  There is no periaortic stranding. Normal caliber of the main pulmonary artery. No pericardial effusion. There is narrowing of the left subclavian vein as it courses under the clavicle, series 3, image 32. No evidence of intraluminal filling defect. Mediastinum/Nodes: Subcentimeter short axis mediastinal lymph nodes are unchanged from prior exam. No suspicious mediastinal adenopathy. Decompressed esophagus. Tiny hiatal hernia. Hilar assessment is limited due to phase of IV contrast. Lungs/Pleura: Moderate emphysema. Right apical and upper lobe scarring. Minimal peripheral scarring in the left lower lobe laterally. Mild bronchial thickening. 7 mm perifissural lymph node in the right lung series 5, image 79, minimally increased in size. Stable 3 mm left lower lobe nodule series 5, image 111. Trace left pleural effusions/thickening. No features of pulmonary edema. Upper Abdomen: Mild hepatic steatosis. Cyst in the subcapsular right lobe of the liver. There is peripancreatic fat stranding diffusely. This is not significantly changed from 11/10/2023 abdominal CT. Bilateral renal cysts. No further follow-up imaging is recommended. Musculoskeletal: There is left supraclavicular stranding extending into the pectoralis musculature. Stranding does not appear to be centered on the subclavian vessels. There is increasing fluid in the left supraclavicular soft tissues which is only partially included in the field of view, for example series 3 images 1-7. There is stranding of the subcutaneous tissues in the midline chest. Mild glenohumeral osteoarthritis of both shoulders mild thoracic spondylosis. IMPRESSION: 1. Fluid in the left supraclavicular soft tissues only partially included in the field of view. This is ill-defined without peripheral enhancement. There is also mild stranding of the left pectoralis musculature. 2. Trace left pleural thickening/effusion. No other acute intrathoracic abnormality. 3. Findings of  pancreatitis, without significant change from recent abdominal CT. 4. TAVR.  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 Aortic Atherosclerosis (ICD10-I70.0) and Emphysema (ICD10-J43.9). Electronically Signed   By: Narda Rutherford M.D.   On: 11/15/2023 17:25    Anti-infectives: Anti-infectives (From admission, onward)    Start     Dose/Rate Route Frequency Ordered Stop   11/08/23 2100  ceFAZolin (ANCEF) IVPB 2g/100 mL premix        2 g 200 mL/hr over 30 Minutes Intravenous Every 8 hours 11/08/23 1647 11/09/23 0548   11/08/23 0400  ceFAZolin (ANCEF) IVPB 2g/100 mL premix  Status:  Discontinued        2 g 200 mL/hr over 30 Minutes Intravenous To Surgery 11/07/23 1323 11/08/23 1514        Assessment/Plan Tony Zhang is an 78 y.o. 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   - Previously discussed cardiac risk with primary team, cardiology, given recent TAVR.  Although ideally the timeframe is 1 month post TAVR for elective surgery, given the patient is quite symptomatic, cardiology states low enough risk that can consider surgery during this hospitalization. We will see how patient progresses and determine if appropriate for surgery during hospitalization. - Hold Coumadin, okay for heparin drip - currently on hold with chest wall  hematoma  - still with significant TTP on exam despite improving labs - not ready for cholecystectomy at this time - Cortrak placed yesterday and on 20 cc/h TF - would not advance past this today - mobilize as tolerated and added colace/miralax - if unable to tolerate trickle TF and advance tomorrow then will need to consider TPN - We will follow with you  FEN - NPO, IVF per primary, PP TF via Cortrak @20cc /h  VTE - SCDs, Heparin gtt held, SQH ID - None currently.   I reviewed nursing notes, Consultant TRH, TCTS notes, last 24 h vitals and pain scores, last  48 h intake and output, last 24 h labs and trends, last 24 h imaging results, and cardiology notes .   This required moderate level of medical decision making.   LOS: 9 days    Juliet Rude , [facility] Surgery 11/17/2023, 8:37 AM Please see Amion for pager number during day hours 7:00am-4:30pm

## 2023-11-17 NOTE — Progress Notes (Addendum)
[facility] PROGRESS NOTE   Tony Zhang:[id number] DOB: 1946-04-28 DOA: 11/08/2023  PCP: System, Provider Not In  Brief History: 78 y.o. male with past medical history of hypertension, dyslipidemia, CAD, severe aortic stenosis, PVD, history of recurrent venous thrombosis, presented for transcatheter aortic valve replacement.  Following the procedure performed on 1/21 patient reported developing severe abdominal pain.  He was found to have pancreatitis.  Then he required transfer to ICU due to hypotension.  He was stabilized and then transferred back to the floor.  Cardiology and general surgery continues to follow.  There was also concern for a subclavian site hematoma which seems to be stable.  He was seen by cardiothoracic surgery as well.    Consultants: General surgery.  Cardiology.  Cardiothoracic surgery.   Subjective/Interval History: Continues to have significant abdominal discomfort.  Some nausea but no vomiting.  Has not had any bowel movements.  Nor has he passed any gas from below.  Shortness of breath is about the same.   Assessment/Plan:  Acute Pancreatitis CT scan showed acute pancreatitis.  Evidence for cholelithiasis.  Patient also underwent ultrasound.  Lipase level was initially in the 600s.  Improvement noted. Underwent MRI/MRCP which showed again cholelithiasis without cholecystitis.  No choledocholithiasis noted. General surgery is following.  Initially plan was for surgery in the future however cardiology thinks he will be low risk.  So they are considering operating during this hospital stay though he is not ready for surgery yet. Core track was placed due to poor oral intake.  Tube feedings initiated.  If he does not tolerate this then general surgery is considering initiating TPN.   Acute Respiratory Failure with Hypoxia Imaging studies showed emphysema with chronic lung changes.   Continue with nebulizer treatments and inhalers.   Continue with  Flutter Valve, Incentive Spirometry, and Guaifenesin 1200 mg po BID Does not use oxygen at home.  Currently saturating in the late 90s on 5 L.  Will drop down to 40 L and monitor closely.  Should be able to wean him down further.   Transient Hypotension, improved In the setting of Hypovolemia after Furosemide and Metoprolol Administration as well as having poor Po Intake Blood pressures have stabilized.  Continue to monitor.   He is not noted to be on any blood pressure lowering agents at this time.   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  Cardiology follows closely. Continue to hold Metoprolol, Jardiance   Acute Urinary Retention Continue with Foley catheter.  Voiding trial can be attempted once more ambulatory. -C/w Tamsulosin 0.8 mg po at bedtime for now   Hematuria -Has a Hx of Prostatomegaly and foley had been placed this Admission -Worsened in the setting of AC -This was discussed with Urology Dr. Berneice Heinrich who recommends no CBI at this point and no CT Hematuria Study to be done -Dr. Berneice Heinrich recommends Supportive Care  Hematuria appears to have improved off of anticoagulation.  Hemoglobin has been stable.  Severe Aortic Stenosis -Status post TAVR during this admission. Cardiology following and managing.  Off of anticoagulation due to subclavian site hematoma.   Previous RLL filling defect in bronchus -Possibly mucus plugging.    Subclavian site hematoma -Worsened in the setting of his heparin drip Seen by thoracic surgery.  No intervention planned.  Anticoagulation has been discontinued.   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  Now off of anticoagulation due to  subclavian site hematoma and hematuria. No positive Doppler studies noted in our system.  His last Doppler was in 2020 which was negative for DVT.   Hypomagnesemia/hypophosphatemia/hyponatremia Continue to monitor   AKI on CKD Stage 3b, improving and stable Metabolic Acidosis, mild Renal function has improved and stable for the most part.  Monitor urine output.  Avoid nephrotoxic agents.   DVT Prophylaxis: Subcutaneous heparin Code Status: Full code Family Communication: Discussed with patient Disposition Plan: To be determined     Medications: Scheduled:  atorvastatin  20 mg Per Tube Daily   Chlorhexidine Gluconate Cloth  6 each Topical Daily   [START ON 11/18/2023] cilostazol  100 mg Per Tube Daily   docusate  100 mg Per Tube BID   feeding supplement (PROSource TF20)  60 mL Per Tube Daily   fluticasone furoate-vilanterol  1 puff Inhalation Daily   gabapentin  400 mg Per Tube Q12H   heparin injection (subcutaneous)  5,000 Units Subcutaneous Q8H   ipratropium  0.5 mg Nebulization BID   levalbuterol  0.63 mg Nebulization BID   metoprolol tartrate  12.5 mg Per Tube BID   polyethylene glycol  17 g Per Tube Daily   sodium chloride flush  3 mL Intravenous Q12H   thiamine  100 mg Per Tube Daily   Continuous:  feeding supplement (OSMOLITE 1.5 CAL) 1,000 mL (11/16/23 1705)   WGN:FAOZHYQMVHQIO (TYLENOL) oral liquid 160 mg/5 mL, dextrose, guaiFENesin, hydrALAZINE, HYDROmorphone (DILAUDID) injection, ondansetron (ZOFRAN) IV, oxyCODONE, sodium chloride flush  Antibiotics: Anti-infectives (From admission, onward)    Start     Dose/Rate Route Frequency Ordered Stop   11/08/23 2100  ceFAZolin (ANCEF) IVPB 2g/100 mL premix        2 g 200 mL/hr over 30 Minutes Intravenous Every 8 hours 11/08/23 1647 11/09/23 0548   11/08/23 0400  ceFAZolin (ANCEF) IVPB 2g/100 mL premix  Status:  Discontinued        2 g 200 mL/hr over 30 Minutes Intravenous To Surgery 11/07/23 1323 11/08/23 1514        Objective:  Vital Signs  Vitals:   11/16/23 2300 11/17/23 0300 11/17/23 0758 11/17/23 0854  BP: (!) 141/63 (!) 136/53 (!) 150/71   Pulse: 100 100 (!) 106   Resp: 20 20 16    Temp: 98.5 F (36.9 C) 98.7 F (37.1 C) 98.1 F (36.7 C)   TempSrc: Oral Oral Oral   SpO2: 93% 93% 96% 97%  Weight:  99.7 kg    Height:        Intake/Output Summary (Last 24 hours) at 11/17/2023 1045 Last data filed at 11/17/2023 0600 Gross per 24 hour  Intake 1507.51 ml  Output 1550 ml  Net -42.49 ml   Filed Weights   11/15/23 0449 11/16/23 0451 11/17/23 0300  Weight: 102.5 kg 100.1 kg 99.7 kg    General appearance: Awake alert.  In no distress Resp: Clear to auscultation bilaterally.  Normal effort Cardio: S1-S2 is normal regular.  No S3-S4.  No rubs murmurs or bruit GI: Abdomen is soft.  Tender diffusely without any rebound rigidity or guarding.  Bowel sounds sluggish but present. Extremities: No edema.  Physical deconditioning noted. No obvious focal neurological deficits.   Lab Results:  Data Reviewed: I have personally  reviewed following labs and reports of the imaging studies  CBC: Recent Labs  Lab 11/13/23 0355 11/14/23 0300 11/15/23 0336 11/15/23 1458 11/16/23 0400 11/17/23 0804  WBC 8.8 7.2 7.5 7.6 7.2 8.6  NEUTROABS 7.6 5.9 6.0  --  5.8  --   HGB 13.1 12.6* 12.9* 13.3 12.7* 12.9*  HCT 39.4 38.7* 39.2 39.7 39.0 39.8  MCV 94.7 95.3 95.1 93.9 94.9 95.2  PLT 177 187 186 223 218 265    Basic Metabolic Panel: Recent Labs  Lab 11/13/23 0355 11/14/23 0300 11/15/23 0336 11/16/23 0400 11/17/23 0804  NA 135 136 132* 131* 135  K 4.5 4.4 4.2 4.4 4.3  CL 105 106 103 101 102  CO2 21* 21* 20* 20* 19*  GLUCOSE 83 112* 105* 81 122*  BUN 29* 23 18 16 20   CREATININE 1.54* 1.55* 1.54* 1.48* 1.41*  CALCIUM 8.1* 8.3* 8.5* 8.4* 8.5*  MG 1.6* 2.2 1.9 1.7 1.7  PHOS 2.4* 2.8 2.6 2.5 2.6    GFR: Estimated Creatinine Clearance: 53.6 mL/min (A) (by C-G formula based on SCr of 1.41  mg/dL (H)).  Liver Function Tests: Recent Labs  Lab 11/13/23 0355 11/14/23 0300 11/15/23 0336 11/16/23 0400 11/17/23 0804  AST 44*  37 25 33 36 37  ALT 11  10 13 17 20 23   ALKPHOS 50  47 51 56 68 73  BILITOT 1.1  1.0 0.8 1.2 1.2 1.1  PROT 5.8*  5.7* 5.7* 5.9* 5.8* 6.3*  ALBUMIN 2.3*  2.3* 2.3* 2.3* 2.2* 2.4*    Recent Labs  Lab 11/10/23 1236 11/11/23 0325 11/13/23 0355 11/14/23 0300 11/15/23 0336 11/16/23 0400 11/17/23 0804  LIPASE 666*   < > 69* 71* 62* 56* 54*  AMYLASE 495*  --   --   --   --   --   --    < > = values in this interval not displayed.    Coagulation Profile: Recent Labs  Lab 11/12/23 0320 11/13/23 0355 11/14/23 0300 11/15/23 0336 11/16/23 0400  INR 1.4* 1.4* 1.7* 2.0* 1.9*    CBG: Recent Labs  Lab 11/15/23 2359 11/16/23 0502 11/16/23 2047 11/16/23 2351 11/17/23 0353  GLUCAP 79 87 114* 118* 124*    Radiology Studies: DG Abd Portable 1V Result Date: 11/16/2023 CLINICAL DATA:  Feeding tube placement. EXAM: PORTABLE ABDOMEN - 1 VIEW COMPARISON:  CT abdomen pelvis dated 11/10/2023. FINDINGS: Feeding tube with weighted tip in the left hemiabdomen in the region of the distal duodenum. IMPRESSION: Feeding tube with tip in the distal duodenum. Electronically Signed   By: Elgie Collard M.D.   On: 11/16/2023 14:26   DG CHEST PORT 1 VIEW Result Date: 11/16/2023 CLINICAL DATA:  Shortness of breath. EXAM: PORTABLE CHEST 1 VIEW COMPARISON:  Chest radiograph dated 11/14/2023 and CT dated 11/15/2023. FINDINGS: Background of emphysema. Blunting of the costophrenic angles most consistent with pleural scarring. No new consolidation. No pneumothorax. The cardiac silhouette is within limits. Atherosclerotic calcification of the aortic arch. No acute osseous pathology. IMPRESSION: 1. No active disease. 2. Emphysema. Electronically Signed   By: Elgie Collard M.D.   On: 11/16/2023 10:30   CT CHEST W CONTRAST Result Date: 11/15/2023 CLINICAL DATA:   Chest wall pain, nontraumatic, history of chest intervention, xray done acute chest hematoma Electronic records indicates history of trans catheter aortic valve replacement, left subclavian, 1 week ago. EXAM: CT CHEST WITH CONTRAST TECHNIQUE: Multidetector CT imaging of the chest was performed during intravenous contrast administration. RADIATION DOSE REDUCTION: This exam  was performed according to the departmental dose-optimization program which includes automated exposure control, adjustment of the mA and/or kV according to patient size and/or use of iterative reconstruction technique. CONTRAST:  50mL OMNIPAQUE IOHEXOL 350 MG/ML SOLN COMPARISON:  Chest CT 11/11/2025, 3 days ago FINDINGS: Cardiovascular: The heart is normal in size. TAVR. Stable ascending aorta at 4 cm. Aortic atherosclerosis. There is no periaortic stranding. Normal caliber of the main pulmonary artery. No pericardial effusion. There is narrowing of the left subclavian vein as it courses under the clavicle, series 3, image 32. No evidence of intraluminal filling defect. Mediastinum/Nodes: Subcentimeter short axis mediastinal lymph nodes are unchanged from prior exam. No suspicious mediastinal adenopathy. Decompressed esophagus. Tiny hiatal hernia. Hilar assessment is limited due to phase of IV contrast. Lungs/Pleura: Moderate emphysema. Right apical and upper lobe scarring. Minimal peripheral scarring in the left lower lobe laterally. Mild bronchial thickening. 7 mm perifissural lymph node in the right lung series 5, image 79, minimally increased in size. Stable 3 mm left lower lobe nodule series 5, image 111. Trace left pleural effusions/thickening. No features of pulmonary edema. Upper Abdomen: Mild hepatic steatosis. Cyst in the subcapsular right lobe of the liver. There is peripancreatic fat stranding diffusely. This is not significantly changed from 11/10/2023 abdominal CT. Bilateral renal cysts. No further follow-up imaging is recommended.  Musculoskeletal: There is left supraclavicular stranding extending into the pectoralis musculature. Stranding does not appear to be centered on the subclavian vessels. There is increasing fluid in the left supraclavicular soft tissues which is only partially included in the field of view, for example series 3 images 1-7. There is stranding of the subcutaneous tissues in the midline chest. Mild glenohumeral osteoarthritis of both shoulders mild thoracic spondylosis. IMPRESSION: 1. Fluid in the left supraclavicular soft tissues only partially included in the field of view. This is ill-defined without peripheral enhancement. There is also mild stranding of the left pectoralis musculature. 2. Trace left pleural thickening/effusion. No other acute intrathoracic abnormality. 3. Findings of pancreatitis, without significant change from recent abdominal CT. 4. TAVR.  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 Aortic Atherosclerosis (ICD10-I70.0) and Emphysema (ICD10-J43.9). Electronically Signed   By: Narda Rutherford M.D.   On: 11/15/2023 17:25       LOS: 9 days   Oliviah Agostini [employer] on [email or web address]  11/17/2023, 10:45 AM

## 2023-11-17 NOTE — Progress Notes (Signed)
Physical Therapy Treatment Patient Details Name: Tony Zhang MRN: [id number] DOB: Mar 07, 1946 Today's Date: 11/17/2023   History of Present Illness Pt is 78 yo presenting to [facility] 1/21 for scheduled surgery. 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 Hospital stay complicated by acute pancreatitis, urinary retention and symptomatic hypotension. PMH includes: CAD (NSTEMI with stents placed 8/23), HFrEF, PAD, DVT/PE, HTN, COPD, CKD, neuropathy, and gout.    PT Comments  Pt received in bed, tolerating NG tube. Reports continued abdominal pain and now back pain as well. No BM since before surgery per his reports. Pt needed min A for bed mobility due to fatigue as well as min A for sit>stand for stability and fwd wt shift. Pt ambulated 20' with RW and CGA with 2 standing rest breaks in this distance. HR 129 bpm, SPO2 94% on 4L O2. Given slow progress and need for possible surgery, changing rec from HHPT to post acute skilled therapy <3 hrs/ day. PT will continue to follow.      If plan is discharge home, recommend the following: A little help with walking and/or transfers;Assist for transportation;Assistance with cooking/housework;Help with stairs or ramp for entrance;A little help with bathing/dressing/bathroom   Can travel by private vehicle     Yes  Equipment Recommendations  None recommended by PT    Recommendations for Other Services       Precautions / Restrictions Precautions Precautions: Fall Restrictions Weight Bearing Restrictions Per Provider Order: No     Mobility  Bed Mobility Overal bed mobility: Needs Assistance Bed Mobility: Supine to Sit     Supine to sit: Min assist     General bed mobility comments: min A needed for full elevation of trunk, pt reports fatigue    Transfers Overall transfer level: Needs assistance Equipment used: Rolling walker (2 wheels) Transfers: Sit to/from Stand Sit to Stand: Min assist, From elevated surface           General transfer  comment: Min A to steady and give mild fwd wt shift. Performed 3x    Ambulation/Gait Ambulation/Gait assistance: Contact guard assist Gait Distance (Feet): 20 Feet Assistive device: Rolling walker (2 wheels) Gait Pattern/deviations: Step-through pattern, Decreased step length - right, Decreased step length - left Gait velocity: decreased Gait velocity interpretation: <1.31 ft/sec, indicative of household ambulator Pre-gait activities: wt shifting General Gait Details: Pt's exercise response mildly improved from last session. SPO2 remained 94% on 4L O2 and HR increased to 129 bpm (137 bpm last session). Pt with 2/4 DOE. Continues to be limited by LE fatigue with shaking in LE's last 5'. Needed 2 standing rest breaks in 20' of ambulation   Stairs             Wheelchair Mobility     Tilt Bed    Modified Rankin (Stroke Patients Only)       Balance Overall balance assessment: Needs assistance Sitting-balance support: No upper extremity supported Sitting balance-Leahy Scale: Fair     Standing balance support: Bilateral upper extremity supported, Reliant on assistive device for balance, During functional activity Standing balance-Leahy Scale: Poor Standing balance comment: reliant on UE support for standing                            Cognition Arousal: Alert Behavior During Therapy: WFL for tasks assessed/performed Overall Cognitive Status: Within Functional Limits for tasks assessed  Exercises General Exercises - Lower Extremity Ankle Circles/Pumps: AROM, Both, 10 reps, Seated Hip Flexion/Marching: AROM, Both, 10 reps, Seated    General Comments General comments (skin integrity, edema, etc.): BP 136/74. HR at rest low 100's. SPO2 97% on 4L.      Pertinent Vitals/Pain Pain Assessment Pain Assessment: Faces Faces Pain Scale: Hurts even more Pain Location: abdomen and back with ambulation Pain  Descriptors / Indicators: Aching Pain Intervention(s): Limited activity within patient's tolerance, Patient requesting pain meds-RN notified, RN gave pain meds during session, Monitored during session    Home Living                          Prior Function            PT Goals (current goals can now be found in the care plan section) Acute Rehab PT Goals Patient Stated Goal: to improve mobility and go home. PT Goal Formulation: With patient Time For Goal Achievement: 11/27/23 Potential to Achieve Goals: Fair    Frequency    Min 1X/week      PT Plan      Co-evaluation              AM-PAC PT "6 Clicks" Mobility   Outcome Measure  Help needed turning from your back to your side while in a flat bed without using bedrails?: A Little Help needed moving from lying on your back to sitting on the side of a flat bed without using bedrails?: A Little Help needed moving to and from a bed to a chair (including a wheelchair)?: A Little Help needed standing up from a chair using your arms (e.g., wheelchair or bedside chair)?: A Little Help needed to walk in hospital room?: A Lot Help needed climbing 3-5 steps with a railing? : Total 6 Click Score: 15    End of Session Equipment Utilized During Treatment: Gait belt Activity Tolerance: Patient limited by fatigue;Patient limited by pain Patient left: in bed;with call bell/phone within reach (EOB) Nurse Communication: Mobility status PT Visit Diagnosis: Other abnormalities of gait and mobility (R26.89);Unsteadiness on feet (R26.81)     Time: 1610-9604 PT Time Calculation (min) (ACUTE ONLY): 33 min  Charges:    $Gait Training: 23-37 mins PT General Charges $$ ACUTE PT VISIT: 1 Visit                     Lyanne Co, PT  Acute Rehab Services Secure chat preferred Office [phone]    [place] Tory Mckissack 11/17/2023, 1:28 PM

## 2023-11-17 NOTE — Progress Notes (Addendum)
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 No appetite. Coretrak in place. No shortness of breath or orthopnea. Hasn't had a BM in over a week.   Inpatient Medications    Scheduled Meds:  atorvastatin  20 mg Per Tube Daily   Chlorhexidine Gluconate Cloth  6 each Topical Daily   cilostazol  100 mg Oral Daily   docusate  100 mg Per Tube BID   feeding supplement (PROSource TF20)  60 mL Per Tube Daily   fluticasone furoate-vilanterol  1 puff Inhalation Daily   gabapentin  400 mg Per Tube Q12H   guaiFENesin  1,200 mg Oral BID   heparin injection (subcutaneous)  5,000 Units Subcutaneous Q8H   ipratropium  0.5 mg Nebulization BID   levalbuterol  0.63 mg Nebulization BID   metoprolol succinate  25 mg Oral Daily   polyethylene glycol  17 g Per Tube Daily   sodium chloride flush  3 mL Intravenous Q12H   tamsulosin  0.8 mg Oral QPM   thiamine  100 mg Per Tube Daily   Continuous  Infusions:  feeding supplement (OSMOLITE 1.5 CAL) 1,000 mL (11/16/23 1705)   PRN Meds: acetaminophen (TYLENOL) oral liquid 160 mg/5 mL, dextrose, hydrALAZINE, HYDROmorphone (DILAUDID) injection, ondansetron (ZOFRAN) IV, oxyCODONE, sodium chloride flush   Vital Signs    Vitals:   11/16/23 2050 11/16/23 2300 11/17/23 0300 11/17/23 0758  BP:  (!) 141/63 (!) 136/53 (!) 150/71  Pulse: 100 100 100 (!) 106  Resp: 19 20 20 16   Temp:  98.5 F (36.9 C) 98.7 F (37.1 C) 98.1 F (36.7 C)  TempSrc:  Oral Oral Oral  SpO2: 95% 93% 93% 96%  Weight:   99.7 kg   Height:        Intake/Output Summary (Last 24 hours) at 11/17/2023 0855 Last data filed at 11/17/2023 0600 Gross per 24 hour  Intake 1507.51 ml  Output 1550 ml  Net -42.49 ml   Filed Weights   11/15/23 0449 11/16/23 0451 11/17/23 0300  Weight: 102.5 kg 100.1 kg 99.7 kg    Physical Exam    GEN: Well nourished, well developed, obese HEENT: Grossly normal.  Neck: Supple, no JVD, Cardiac: RRR, no murmurs, rubs, or gallops. No clubbing, cyanosis, edema.   Respiratory:  Respirations regular and unlabored, clear to auscultation bilaterally. GI: abdominal distension and tenderness to palpation MS: no deformity or atrophy. Skin: warm and dry, no rash. Groin/radial/subclvian site stable. Subclavian site with ecchymoses and swelling that is improved  Neuro:  Strength and sensation are intact. Psych: AAOx3.  Normal affect.  Labs    CBC Recent Labs    11/15/23 0336 11/15/23 1458 11/16/23 0400 11/17/23 0804  WBC 7.5   < > 7.2 8.6  NEUTROABS 6.0  --  5.8  --   HGB 12.9*   < > 12.7* 12.9*  HCT 39.2   < > 39.0 39.8  MCV 95.1   < > 94.9 95.2  PLT 186   < > 218 265   < > = values in this interval not displayed.   Basic Metabolic Panel Recent Labs    16/10/96 0336 11/16/23 0400  NA 132* 131*  K 4.2 4.4  CL 103 101  CO2 20* 20*  GLUCOSE 105* 81  BUN 18 16  CREATININE 1.54* 1.48*  CALCIUM 8.5* 8.4*  MG 1.9 1.7  PHOS 2.6  2.5   Liver Function Tests Recent Labs    11/15/23 0336 11/16/23 0400  AST 33 36  ALT 17 20  ALKPHOS 56 68  BILITOT 1.2 1.2  PROT 5.9* 5.8*  ALBUMIN 2.3* 2.2*   Recent Labs    11/15/23 0336 11/16/23 0400  LIPASE 62* 56*    Telemetry    Sinus tach with HRs in low 100s - Personally Reviewed  ECG    Sinus with inferior anteroseptal q waves, 86 bpm. - Personally Reviewed  Patient Profile     Tony Zhang is a 78 y.o. 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 Pt developed acute abdominal pain after TAVR and found to have acute gallstone pancreatitis.   Assessment & Plan    Severe AS: s/p successful TAVR with a 26 mm Edwards Sapien 3 Ultra Resilia THV via the TF approach on 11/09/23. Post operative echo showed EF 65%, normally functioning TAVR with a mean gradient of 5 mmHg and no PVL.   Subclavian seroma vs hematoma: he has been seen by the CT surgery team and this is not felt to be active bleeding from the subclavian artery. Heparin gtt has been reduced to Q8 hours at 5000 units.    Symptomatic hypotension: resolved with IV fluids. Likely related to acute pancreatitis.   HTN: BP starting to creep up. Will add back home Toprol XL but at a lower dose.   Acute on chronic HFpEF: as evidenced by an elevated LVEDP ~28 mm hg at the time of TAVR. Treated with one dose of IV lasix 40mg /KDur on 1/22. Then Lasix, Jardiance & Toprol-XL held given AKI and hypotension. Thankfully, has not had any s/s of acute CHF. BNP 255.   Sinus tach: HRs 100-110s. Likely related to pain and rebound from holding home BB. Will resume Toprol XL 25 mg daily.    AKI with baseline CKD stage IIIb: baseline creat 1.5-2.4. Creat peaked at 3.8. Likely multifactorial with contrast exposure during TAVR, hypotension and poor PO intake with acute  pancreatitis. Resolved with IV fluids. Creatinine down to 1.48. Labs pending today.    Acute gallstone pancreatitis: CT scan showed acute pancreatitis as well as gallstones. Appreciate input of IM team and Surgery team. NPO and now s/p Cortrak placement. Not ready for surgery given degree of inflammation on imaging and continued pain. He is cleared for surgery from a cardiac standpoint when appropriate.    Acute urinary retention: continue foley. Flomax  increased.  Constipation: no BM in over a week. Surgery team added colace/miralax. Try to mobilize as much as possible.   Hyponatremia: Na 131 yesterday. Labs pending today   CAD: s/p DES to LAD 2023. Pre op [facility] 09/28/23 showed severe calcified stenosis of the RCA unchanged from prior in 2023, patent mid LAD stent with moderate nonobstructive disease proximally, patent left circumflex, and patent subclavian with mild stenosis. Plan for medical management of CAD. Continue on atorvastatin 40 mg daily, Repatha 140mg  q 2weeks. No aspirin given chronic OAC.    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 Continue medical management with Pletal 100mg  daily, atorvastatin 40 mg daily, Repatha 140mg  q 2weeks. No aspirin given chronic OAC.   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    DVT/PE: Coumadin on hold awaiting surgery. IV heparin stopped due to wound hematoma. Continue heparin 5000U Q8 hours.  Previous RLL filling defect in bronchus: possibly mucus plugging. Repeat chest CT chest was reassuring that the previously seen endobronchial lesion was benign. No follow up CT required for this.  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   Byrd Hesselbach, PA-C  11/17/2023, 8:55 AM  Pager [phone]  I have personally seen and examined this patient. I agree with the  assessment and plan as outlined above.  Post TAVR developed acute gallstone pancreatitis. He is being following by the surgery team. No plans for surgery this week given inflammation on imaging and unresolved pancreatitis.  From a cardiac standpoint, he is stable.  Only c/o abdominal pain.  No BM in a week.  NG tube in place with tube feeding.  Labs reviewed by me today: Renal function stable. H/H stable My exam: Elderly male in NAD. CV:RRR Ext: no LE edema  Plan: Severe AS s/p TAVR: Stable. Left chest incision stable from yesterday Chronic diastolic CHF: No volume overload on exam Gallstone pancreatitis: Surgical team is following.   Verne Carrow, MD, [facility] 11/17/2023 9:56 AM  No volume overload on exam

## 2023-11-18 ENCOUNTER — Ambulatory Visit: Payer: Medicare Other

## 2023-11-18 DIAGNOSIS — K851 Biliary acute pancreatitis without necrosis or infection: Secondary | ICD-10-CM | POA: Diagnosis not present

## 2023-11-18 DIAGNOSIS — Z954 Presence of other heart-valve replacement: Secondary | ICD-10-CM | POA: Diagnosis not present

## 2023-11-18 DIAGNOSIS — I350 Nonrheumatic aortic (valve) stenosis: Secondary | ICD-10-CM | POA: Diagnosis not present

## 2023-11-18 DIAGNOSIS — Z952 Presence of prosthetic heart valve: Secondary | ICD-10-CM | POA: Diagnosis not present

## 2023-11-18 DIAGNOSIS — I5032 Chronic diastolic (congestive) heart failure: Secondary | ICD-10-CM | POA: Diagnosis not present

## 2023-11-18 DIAGNOSIS — J9601 Acute respiratory failure with hypoxia: Secondary | ICD-10-CM | POA: Diagnosis not present

## 2023-11-18 LAB — PROTIME-INR
INR: 1.3 — ABNORMAL HIGH (ref 0.8–1.2)
Prothrombin Time: 16.5 s — ABNORMAL HIGH (ref 11.4–15.2)

## 2023-11-18 LAB — PHOSPHORUS
Phosphorus: 2.3 mg/dL — ABNORMAL LOW (ref 2.5–4.6)
Phosphorus: 2.3 mg/dL — ABNORMAL LOW (ref 2.5–4.6)

## 2023-11-18 LAB — GLUCOSE, CAPILLARY
Glucose-Capillary: 113 mg/dL — ABNORMAL HIGH (ref 70–99)
Glucose-Capillary: 124 mg/dL — ABNORMAL HIGH (ref 70–99)
Glucose-Capillary: 132 mg/dL — ABNORMAL HIGH (ref 70–99)
Glucose-Capillary: 135 mg/dL — ABNORMAL HIGH (ref 70–99)
Glucose-Capillary: 142 mg/dL — ABNORMAL HIGH (ref 70–99)
Glucose-Capillary: 148 mg/dL — ABNORMAL HIGH (ref 70–99)

## 2023-11-18 LAB — CBC
HCT: 42.4 % (ref 39.0–52.0)
Hemoglobin: 14.1 g/dL (ref 13.0–17.0)
MCH: 31.3 pg (ref 26.0–34.0)
MCHC: 33.3 g/dL (ref 30.0–36.0)
MCV: 94 fL (ref 80.0–100.0)
Platelets: 308 10*3/uL (ref 150–400)
RBC: 4.51 MIL/uL (ref 4.22–5.81)
RDW: 15.2 % (ref 11.5–15.5)
WBC: 9.8 10*3/uL (ref 4.0–10.5)
nRBC: 0 % (ref 0.0–0.2)

## 2023-11-18 LAB — MAGNESIUM
Magnesium: 1.7 mg/dL (ref 1.7–2.4)
Magnesium: 1.7 mg/dL (ref 1.7–2.4)

## 2023-11-18 MED ORDER — METOPROLOL TARTRATE 25 MG PO TABS
25.0000 mg | ORAL_TABLET | Freq: Two times a day (BID) | ORAL | Status: DC
  Administered 2023-11-18 – 2023-11-21 (×8): 25 mg
  Filled 2023-11-18 (×8): qty 1

## 2023-11-18 MED ORDER — OSMOLITE 1.5 CAL PO LIQD
1000.0000 mL | ORAL | Status: DC
  Administered 2023-11-18 – 2023-11-23 (×5): 1000 mL
  Filled 2023-11-18 (×9): qty 1000

## 2023-11-18 NOTE — Progress Notes (Signed)
Nurse requested Mobility Specialist to perform oxygen saturation test with pt which includes removing pt from oxygen both at rest and while ambulating.  Below are the results from that testing.     Patient Saturations on Room Air at Rest = spO2 85%  Patient Saturations on 1 L while Ambulating = sp02 81% .  Rested and performed pursed lip breathing for 1 minute with sp02 at 79%.  Patient Saturations on 3 Liters of oxygen while Ambulating = sp02 91%  At end of testing pt left in room on 4  Liters of oxygen.  Reported results to nurse.   [name] Mobility Specialist Please contact via Special educational needs teacher or  Rehab office at [phone]

## 2023-11-18 NOTE — Progress Notes (Signed)
Physical Therapy Treatment Patient Details Name: Tony Zhang MRN: [id number] DOB: Feb 18, 1946 Today's Date: 11/18/2023   History of Present Illness Pt is 78 yo presenting to [facility] 1/21 for scheduled surgery. 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 Hospital stay complicated by acute pancreatitis, gallstones, urinary retention and symptomatic hypotension. ICU stay from 1/24-1/26 for decreased LOC and labile pressures. Concern for hematoma at subclavian site on 1/28, no intervention planned. PMH includes: CAD (NSTEMI with stents placed 8/23), HFrEF, PAD, DVT/PE, HTN, COPD, CKD, neuropathy, and gout.   PT Comments  Pt in bed upon arrival and agreeable to PT session. Worked on gait training and LE strength in today's session. Pt progressed in today's session by increasing gait distance to ~70 ft with RW and CGA. Pt has decreased activity tolerance and needed three standing rest breaks due to LE fatigue. Pt is able to complete minimal LE exercises before becoming too fatigued to continue. Pt would continue to benefit from <3hrs post acute rehab to gain independence with mobility and prevent further deconditioning. Pt is progressing well towards goals. Acute PT to follow.      If plan is discharge home, recommend the following: A little help with walking and/or transfers;Assist for transportation;Assistance with cooking/housework;Help with stairs or ramp for entrance;A little help with bathing/dressing/bathroom   Can travel by private vehicle     Yes  Equipment Recommendations  None recommended by PT       Precautions / Restrictions Precautions Precautions: Fall Precaution Comments: cortrak Restrictions Weight Bearing Restrictions Per Provider Order: No     Mobility  Bed Mobility Overal bed mobility: Needs Assistance Bed Mobility: Supine to Sit    Supine to sit: Contact guard, HOB elevated     General bed mobility comments: CGA for safety with HOB elevated    Transfers Overall transfer level:  Needs assistance Equipment used: Rolling walker (2 wheels) Transfers: Sit to/from Stand Sit to Stand: Min assist      General transfer comment: MinA for boost up and steadying, cues for hand placement with RW    Ambulation/Gait Ambulation/Gait assistance: Contact guard assist Gait Distance (Feet): 70 Feet Assistive device: Rolling walker (2 wheels) Gait Pattern/deviations: Step-through pattern, Decreased step length - right, Decreased step length - left Gait velocity: decreased     General Gait Details: Slow and steady gait with no LOB. Three standing rest breaks due to LE fatigue    Balance Overall balance assessment: Needs assistance Sitting-balance support: No upper extremity supported Sitting balance-Leahy Scale: Fair     Standing balance support: Bilateral upper extremity supported, Reliant on assistive device for balance, During functional activity Standing balance-Leahy Scale: Poor Standing balance comment: reliant on UE support        Cognition Arousal: Alert Behavior During Therapy: WFL for tasks assessed/performed Overall Cognitive Status: Within Functional Limits for tasks assessed        Exercises General Exercises - Lower Extremity Ankle Circles/Pumps: AROM, Both, 10 reps, Seated Long Arc Quad: AROM, Both, 20 reps, Seated Heel Raises: AROM, Both, 10 reps, Seated Other Exercises Other Exercises: x3 consecutive STS with MinA and RW    General Comments General comments (skin integrity, edema, etc.): VSS on 3L      Pertinent Vitals/Pain Pain Assessment Pain Assessment: Faces Faces Pain Scale: Hurts a little bit Pain Location: abdomen Pain Descriptors / Indicators: Aching Pain Intervention(s): Limited activity within patient's tolerance, Monitored during session, Repositioned     PT Goals (current goals can now be found in the  care plan section) Acute Rehab PT Goals Patient Stated Goal: to improve mobility and go home. PT Goal Formulation: With  patient Time For Goal Achievement: 11/27/23 Potential to Achieve Goals: Fair    Frequency    Min 1X/week       AM-PAC PT "6 Clicks" Mobility   Outcome Measure  Help needed turning from your back to your side while in a flat bed without using bedrails?: A Little Help needed moving from lying on your back to sitting on the side of a flat bed without using bedrails?: A Little Help needed moving to and from a bed to a chair (including a wheelchair)?: A Little Help needed standing up from a chair using your arms (e.g., wheelchair or bedside chair)?: A Little Help needed to walk in hospital room?: A Little Help needed climbing 3-5 steps with a railing? : Total 6 Click Score: 16    End of Session Equipment Utilized During Treatment: Gait belt;Oxygen Activity Tolerance: Patient tolerated treatment well Patient left: in bed;with call bell/phone within reach Nurse Communication: Mobility status PT Visit Diagnosis: Other abnormalities of gait and mobility (R26.89);Unsteadiness on feet (R26.81)     Time: 1439-1510 PT Time Calculation (min) (ACUTE ONLY): 31 min  Charges:    $Gait Training: 8-22 mins $Therapeutic Exercise: 8-22 mins PT General Charges $$ ACUTE PT VISIT: 1 Visit                     Hilton Cork, PT, DPT Secure Chat Preferred  Rehab Office [phone]    Arturo Morton Brion Aliment 11/18/2023, 3:50 PM

## 2023-11-18 NOTE — Progress Notes (Addendum)
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 Walking up and down the halls during the day with no issues. No SOB or orthopnea.   Inpatient Medications    Scheduled Meds:  atorvastatin  20 mg Per Tube Daily   Chlorhexidine Gluconate Cloth  6 each Topical Daily   cilostazol  100 mg Per Tube Daily   docusate  100 mg Per Tube BID   feeding supplement (PROSource TF20)  60 mL Per Tube Daily   fluticasone furoate-vilanterol  1 puff Inhalation Daily   gabapentin  400 mg Per Tube Q12H   heparin injection (subcutaneous)  5,000 Units Subcutaneous Q8H   ipratropium  0.5 mg Nebulization BID   levalbuterol  0.63 mg Nebulization BID   metoprolol tartrate  25 mg Per Tube BID   polyethylene glycol  17 g Per Tube Daily   sodium chloride flush  3 mL Intravenous Q12H   thiamine  100 mg Per Tube Daily   Continuous Infusions:  feeding supplement (OSMOLITE 1.5 CAL) 1,000 mL (11/16/23 1705)   PRN Meds: acetaminophen (TYLENOL)  oral liquid 160 mg/5 mL, dextrose, guaiFENesin, hydrALAZINE, HYDROmorphone (DILAUDID) injection, ondansetron (ZOFRAN) IV, oxyCODONE, sodium chloride flush   Vital Signs    Vitals:   11/17/23 2047 11/18/23 0405 11/18/23 0442 11/18/23 0805  BP:  (!) 152/49 (!) 130/50 137/70  Pulse: 96 88 86 96  Resp: 15 19 18 20   Temp:  98.1 F (36.7 C) 98.1 F (36.7 C) 98.5 F (36.9 C)  TempSrc:  Oral Oral Oral  SpO2: 100% 95% 93% 93%  Weight:   93.1 kg   Height:        Intake/Output Summary (Last 24 hours) at 11/18/2023 0834 Last data filed at 11/18/2023 0500 Gross per 24 hour  Intake --  Output 1600 ml  Net -1600 ml   Filed Weights   11/16/23 0451 11/17/23 0300 11/18/23 0442  Weight: 100.1 kg 99.7 kg 93.1 kg    Physical Exam    GEN: Well nourished, well developed, obese HEENT: Grossly normal.  Neck: Supple, no JVD, Cardiac: RRR, no murmurs, rubs, or gallops. No clubbing, cyanosis, edema.   Respiratory:  Respirations regular and unlabored, clear to auscultation bilaterally. GI: abdominal distension and with improvement in tenderness to palpation  MS: no deformity or atrophy. Skin: warm and dry, no rash. Groin/radial/subclvian site stable. Subclavian site with ecchymoses and swelling that is improved  Neuro:  Strength and sensation are intact. Psych: AAOx3.  Normal affect.  Labs    CBC Recent  Labs    11/16/23 0400 11/17/23 0804  WBC 7.2 8.6  NEUTROABS 5.8  --   HGB 12.7* 12.9*  HCT 39.0 39.8  MCV 94.9 95.2  PLT 218 265   Basic Metabolic Panel Recent Labs    65/78/46 0400 11/17/23 0804 11/17/23 2009  NA 131* 135  --   K 4.4 4.3  --   CL 101 102  --   CO2 20* 19*  --   GLUCOSE 81 122*  --   BUN 16 20  --   CREATININE 1.48* 1.41*  --   CALCIUM 8.4* 8.5*  --   MG 1.7 1.7 1.7  PHOS 2.5 2.6 2.4*   Liver Function Tests Recent Labs    11/16/23 0400 11/17/23 0804  AST 36 37  ALT 20 23  ALKPHOS 68 73  BILITOT 1.2 1.1  PROT 5.8* 6.3*  ALBUMIN 2.2* 2.4*   Recent  Labs    11/16/23 0400 11/17/23 0804  LIPASE 56* 54*    Telemetry    Sinus tach with HRs in 80-90s, 3.7 second run of SVT - Personally Reviewed  ECG    Sinus with inferior anteroseptal q waves, 86 bpm. - Personally Reviewed  Patient Profile     [name] is a 78 y.o. 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 on 11/08/23 for planned TAVR. Pt developed acute abdominal pain after TAVR and found to have acute gallstone pancreatitis.   Assessment & Plan    Severe AS: s/p successful TAVR with a 26 mm Edwards Sapien 3 Ultra Resilia THV via the TF approach on 11/09/23. Post operative echo showed EF 65%, normally functioning TAVR with a mean gradient of 5 mmHg and no PVL.   Subclavian seroma vs hematoma: he has been seen by Dr. Laneta Simmers this morning who thinks it will self resolve. Heparin gtt has been reduced to Q8 hours at 5000 units.    Symptomatic hypotension: resolved with IV fluids. Likely related to acute pancreatitis.   HTN: BP improved on Lopressor 12.5mg  BID added (able to be crushed). Bps still borderline high -will increase to 25mg  BID.  Sinus tach with short runs of SVT: HRs improved on Lopressor. As above, increase to 25mg  BID.   Acute on chronic HFpEF: as evidenced by an elevated LVEDP ~28 mm hg at the time of TAVR. Treated with one dose of IV lasix 40mg /KDur on 1/22. Then Lasix, Jardiance & Toprol-XL held given AKI and hypotension. Thankfully, has not had any s/s of acute CHF. BNP 255.   AKI with baseline CKD stage IIIb: baseline creat 1.5-2.4. Creat peaked at 3.8. Likely multifactorial with contrast exposure during TAVR, hypotension and poor PO intake with acute pancreatitis. Resolved with IV fluids. Creatinine down to 1.41. Labs pending today.    Acute gallstone pancreatitis: CT scan showed acute pancreatitis as well as gallstones. Appreciate  input of IM team and Surgery team. NPO and now s/p Cortrak placement. Not felt to be ready for surgery given degree of inflammation on imaging and continued pain. Pain is now improved. He is cleared for surgery from a cardiac standpoint when appropriate.    Acute urinary retention: continue foley and Flomax   Constipation: no BM in over a week. Surgery team added colace/miralax. Try to mobilize as much as possible.   Hyponatremia: resolved by labs yesterday.    CAD: s/p  DES to LAD 2023. Pre op [facility] 09/28/23 showed severe calcified stenosis of the RCA unchanged from prior in 2023, patent mid LAD stent with moderate nonobstructive disease proximally, patent left circumflex, and patent subclavian with mild stenosis. Plan for medical management of CAD. Continue on atorvastatin 40 mg daily, Repatha 140mg  q 2weeks. No aspirin given chronic OAC.    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 Continue medical management with Pletal 100mg  daily, atorvastatin 40 mg daily, Repatha 140mg  q 2weeks. No aspirin given chronic OAC.   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    DVT/PE: Coumadin on hold awaiting surgery. IV heparin stopped due to wound hematoma. Continue heparin 5000U Q8 hours.  Previous RLL filling defect in bronchus: possibly mucus plugging. Repeat chest CT chest was reassuring that the previously seen endobronchial lesion was benign. No follow up CT required for this.  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   Byrd Hesselbach, PA-C  11/18/2023, 8:34 AM  Pager [phone]  I have personally seen and examined this patient. I agree with the assessment and plan as outlined above.  He did well with his TAVR but developed acute gallstone pancreatitis. Hospitalization extended at this is being followed by the  surgery team. He seems to be improving from a pain standpoint. Possible cholecystectomy over the next 2-3 days.  No changes in his cardiac status. He is not volume overloaded.  My exam: NAD, CV:RRR Lungs: clear Ext: no LE edema. Abd: TTP NG tube in place.  Plan: Severe AS s/p TAVR. Stable. Left chest surgical wound stable.  Chronic diastolic CHF: He is not volume overloaded. NO changes today Acute gallstone pancreatitis: surgical team on board and making recommendations  Verne Carrow, MD, [facility] 11/18/2023 10:15 AM

## 2023-11-18 NOTE — Progress Notes (Addendum)
Nutrition Follow-up  DOCUMENTATION CODES:   Not applicable  INTERVENTION:   Advance TF to goal rate: Advance Osmolite 1.5 by 10mL Q12H to goal rate of 49mL/hr  Pro-Source TF20 60 mL daily    TF regimen to provide 2340kcal, 118g protein, free water   Monitor magnesium and phosphorus every 12 hours x 4 occurrences, MD to replete as needed, as pt is at risk for refeeding syndrome   Thiamine 100mg  daily x 5 days for refeeding   NUTRITION DIAGNOSIS:  Inadequate oral intake related to inability to eat as evidenced by NPO status. - remains applicable  GOAL:  Patient will meet greater than or equal to 90% of their needs - progressing  MONITOR:  TF tolerance, Diet advancement, Labs  REASON FOR ASSESSMENT:   Consult Enteral/tube feeding initiation and management  ASSESSMENT:  Pt is 78 y.o. male w/ PMH: HTN, dyslipidemia, CAD, severe aortic stenosis, COPD, CKD,  PVD, hx of recurrent venous thrombosis. Presented for transcatheter aortic valve replacement. He developed pancreatitis s/p procedure.  1/21 TAVR procedure 1/24 ICU d/t progressive decline 1/26 back to progressive unit  1/29 cortrak placed (distal duodenem), trickles initiated 1/31 TF advanced   Abdominal pain has improving. He continues with no BM. Surgery adding Colace/Miralax. Pt mobilizing.  Tolerating trickles. Surgery okay to advance to goal rate. Surgery to assess tomorrow and schedule lap chole, if tolerating TF with no increased pain or nausea.   Admit Weight: 102.1kg Current Weight: 93.1kg  No LE edema noted. Abdomen remains distended. Labs stable. Lipase continues to trend down. Crt at baseline.Hyponatremia resolved.   Intake/Output Summary (Last 24 hours) at 11/18/2023 1019 Last data filed at 11/18/2023 0500 Gross per 24 hour  Intake --  Output 1600 ml  Net -1600 ml   Net IO Since Admission: -2,922.69 mL [11/18/23 1019]   Labs: Na+ 132>131>135 Lipase 666--->54 (H) CBGs 81-122 over 48  hours A1c 5.5 (02/2023) Crt 1.54>1.48>1.41   Meds: gabapentin, thiamine, Miralax, docusate   Diet Order:   Diet Order             Diet NPO time specified Except for: Other (See Comments)  Diet effective now             EDUCATION NEEDS:  No education needs have been identified at this time  Skin:  Skin Assessment: Reviewed RN Assessment  Last BM:  1/20  Height:  Ht Readings from Last 1 Encounters:  11/08/23 6\' 1"  (1.854 m)    Weight:  Wt Readings from Last 1 Encounters:  11/18/23 93.1 kg    Ideal Body Weight:  83.6 kg  BMI:  Body mass index is 27.08 kg/m.  Estimated Nutritional Needs:   Kcal:  2200-2400kcal  Protein:  110-120g  Fluid:  >2L/day  Myrtie Cruise MS, RD, LDN Registered Dietitian Clinical Nutrition RD Inpatient Contact Info in Amion

## 2023-11-18 NOTE — TOC CM/SW Note (Signed)
Transition of Care [facility]) - Inpatient Brief Assessment Donn Pierini RN, BSN Transitions of Care Unit 4E- RN Case Manager See Treatment Team for direct phone #   Patient Details  Name: LIVINGSTON DENNER MRN: [id number] Date of Birth: [birth year]  Transition of Care [facility]) CM/SW Contact:    Darrold Span, RN Phone Number: 11/18/2023, 3:44 PM   Clinical Narrative: Pt s/p TAVR complicated by pancreatitis post op. Now w/ Cortrak and TF.   We will continue to monitor patient advancement through interdisciplinary progression rounds. If new patient transition needs arise, please place a TOC consult. Note updated recommendations made today for SNF.    Transition of Care Asessment: Insurance and Status: Insurance coverage has been reviewed Patient has primary care physician: Yes Home environment has been reviewed: home Prior level of function:: self Prior/Current Home Services: No current home services Social Drivers of Health Review: SDOH reviewed no interventions necessary Readmission risk has been reviewed: Yes Transition of care needs: transition of care needs identified, TOC will continue to follow

## 2023-11-18 NOTE — Progress Notes (Signed)
10 Days Post-Op  Subjective: Abdominal pain still present but improved, tolerating trickle TF  Objective: Vital signs in last 24 hours: Temp:  [98 F (36.7 C)-98.5 F (36.9 C)] 98.5 F (36.9 C) (01/31 0805) Pulse Rate:  [80-131] 80 (01/31 0914) Resp:  [14-24] 20 (01/31 0805) BP: (130-163)/(49-72) 160/50 (01/31 0914) SpO2:  [90 %-100 %] 93 % (01/31 0805) Weight:  [93.1 kg] 93.1 kg (01/31 0442) Last BM Date : 11/07/23  Intake/Output from previous day: 01/30 0701 - 01/31 0700 In: -  Out: 1600 [Urine:1600] Intake/Output this shift: No intake/output data recorded.  PE: Gen:  Alert, NAD, pleasant Abd: Soft, protuberant but baseline, minimal tenderness to palpation epigastrium and periumbilical  Lab Results:  Recent Labs    11/16/23 0400 11/17/23 0804  WBC 7.2 8.6  HGB 12.7* 12.9*  HCT 39.0 39.8  PLT 218 265   BMET Recent Labs    11/16/23 0400 11/17/23 0804  NA 131* 135  K 4.4 4.3  CL 101 102  CO2 20* 19*  GLUCOSE 81 122*  BUN 16 20  CREATININE 1.48* 1.41*  CALCIUM 8.4* 8.5*   PT/INR Recent Labs    11/16/23 0400  LABPROT 22.1*  INR 1.9*   CMP     Component Value Date/Time   NA 135 11/17/2023 0804   NA 140 10/05/2023 1010   K 4.3 11/17/2023 0804   CL 102 11/17/2023 0804   CO2 19 (L) 11/17/2023 0804   GLUCOSE 122 (H) 11/17/2023 0804   BUN 20 11/17/2023 0804   BUN 24 10/05/2023 1010   CREATININE 1.41 (H) 11/17/2023 0804   CREATININE 1.56 (H) 09/17/2013 1135   CALCIUM 8.5 (L) 11/17/2023 0804   PROT 6.3 (L) 11/17/2023 0804   PROT 7.2 09/22/2023 0947   ALBUMIN 2.4 (L) 11/17/2023 0804   ALBUMIN 4.4 09/22/2023 0947   AST 37 11/17/2023 0804   ALT 23 11/17/2023 0804   ALKPHOS 73 11/17/2023 0804   BILITOT 1.1 11/17/2023 0804   BILITOT 0.7 09/22/2023 0947   GFRNONAA 51 (L) 11/17/2023 0804   GFRAA >60 08/14/2019 0429   Lipase     Component Value Date/Time   LIPASE 54 (H) 11/17/2023 0804    Studies/Results: DG Abd Portable 1V Result Date:  11/16/2023 CLINICAL DATA:  Feeding tube placement. EXAM: PORTABLE ABDOMEN - 1 VIEW COMPARISON:  CT abdomen pelvis dated 11/10/2023. FINDINGS: Feeding tube with weighted tip in the left hemiabdomen in the region of the distal duodenum. IMPRESSION: Feeding tube with tip in the distal duodenum. Electronically Signed   By: Elgie Collard M.D.   On: 11/16/2023 14:26    Anti-infectives: Anti-infectives (From admission, onward)    Start     Dose/Rate Route Frequency Ordered Stop   11/08/23 2100  ceFAZolin (ANCEF) IVPB 2g/100 mL premix        2 g 200 mL/hr over 30 Minutes Intravenous Every 8 hours 11/08/23 1647 11/09/23 0548   11/08/23 0400  ceFAZolin (ANCEF) IVPB 2g/100 mL premix  Status:  Discontinued        2 g 200 mL/hr over 30 Minutes Intravenous To Surgery 11/07/23 1323 11/08/23 1514        Assessment/Plan Tony Zhang is an 78 y.o. 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   - Previously discussed cardiac risk with primary team, cardiology, given recent TAVR.  Although ideally the timeframe is 1 month post TAVR for elective  surgery, given the patient is quite symptomatic, cardiology states low enough risk that can consider surgery during this hospitalization. We will see how patient progresses and determine if appropriate for surgery during hospitalization. - Hold Coumadin, okay for heparin drip - currently on hold with chest wall hematoma  - Abdominal exam improving.  - Cortrak in place, TF at trickle --ok to advance to goal - Will reasess tomorrow. If still doing well, tolerating feeds without increasing pain or nausea then tentatively lap chole Sunday but more likely early next week. - mobilize as tolerated and added colace/miralax - We will follow with you  FEN - NPO, IVF per primary, PP TF via Cortrak, advance to goal VTE - SCDs, Heparin gtt held, SQH ID - None currently.    I reviewed nursing notes, Consultant TRH, TCTS notes, last 24 h vitals and pain scores, last 48 h intake and output, last 24 h labs and trends, last 24 h imaging results, and cardiology notes .   This required moderate level of medical decision making.   LOS: 10 days    Lysle Rubens , MD [facility] 11/18/2023, 9:30 AM Please see Amion for pager number during day hours 7:00am-4:30pm

## 2023-11-18 NOTE — Progress Notes (Signed)
Mobility Specialist Progress Note:    11/18/23 1230  Mobility  Activity Ambulated with assistance in room  Level of Assistance Standby assist, set-up cues, supervision of patient - no hands on  Assistive Device Front wheel walker  Distance Ambulated (ft) 30 ft  Activity Response Tolerated well  Mobility Referral Yes  Mobility visit 1 Mobility  Mobility Specialist Start Time (ACUTE ONLY) 1230  Mobility Specialist Stop Time (ACUTE ONLY) 1240  Mobility Specialist Time Calculation (min) (ACUTE ONLY) 10 min   Pt received dangling EOB, eager for mobility session. Ambulated in room, attempted on RA. SpO2 85% on RA sitting EOB. Able to recover on 2L at rest, but during mobility increased O2 flow, SpO2 91% on 3L while ambulating. Pt agreeable to sit up in chair for bed linen change. Tolerated well, asx throughout. No c/o dizziness, SOB, or pain. Pt reports feeling better than previous days. Left with all needs met, call bell in reach, RN aware.   [name] Mobility Specialist Please contact via Special educational needs teacher or  Rehab office at [phone]

## 2023-11-18 NOTE — Progress Notes (Signed)
10 Days Post-Op Procedure(s) (LRB): Transcatheter Aortic Valve Replacement-Subclavian (Left) TRANSESOPHAGEAL ECHOCARDIOGRAM (N/A) Subjective:  Says his abdomen feels better but still some discomfort, feels bloated, no BM yet.   Denies pain around left subclavicular incision or shoulder.  Objective: Vital signs in last 24 hours: Temp:  [98 F (36.7 C)-98.5 F (36.9 C)] 98.1 F (36.7 C) (01/31 0442) Pulse Rate:  [86-131] 86 (01/31 0442) Cardiac Rhythm: Sinus tachycardia;Supraventricular tachycardia (01/31 0405) Resp:  [14-24] 18 (01/31 0442) BP: (130-163)/(49-72) 130/50 (01/31 0442) SpO2:  [90 %-100 %] 93 % (01/31 0442) Weight:  [93.1 kg] 93.1 kg (01/31 0442)  Hemodynamic parameters for last 24 hours:    Intake/Output from previous day: 01/30 0701 - 01/31 0700 In: -  Out: 1600 [Urine:1600] Intake/Output this shift: No intake/output data recorded.  General appearance: alert and cooperative Neurologic: intact Heart: regular rate and rhythm Lungs: clear to auscultation bilaterally Abdomen: distended, mildly tender; bowel sounds present. Extremities: warm. Strong left radial pulse.  Wound: left subclavicular incision healing well without signs of infection. Subcutaneous seroma/hematoma is soft. No tenderness or erythema. Does not appear to be enlarging.  Lab Results: Recent Labs    11/16/23 0400 11/17/23 0804  WBC 7.2 8.6  HGB 12.7* 12.9*  HCT 39.0 39.8  PLT 218 265   BMET:  Recent Labs    11/16/23 0400 11/17/23 0804  NA 131* 135  K 4.4 4.3  CL 101 102  CO2 20* 19*  GLUCOSE 81 122*  BUN 16 20  CREATININE 1.48* 1.41*  CALCIUM 8.4* 8.5*    PT/INR:  Recent Labs    11/16/23 0400  LABPROT 22.1*  INR 1.9*   ABG    Component Value Date/Time   PHART 7.405 09/13/2020 1301   HCO3 24.1 09/28/2023 1118   HCO3 23.5 09/28/2023 1118   TCO2 24 11/08/2023 1436   ACIDBASEDEF 1.0 09/28/2023 1118   ACIDBASEDEF 2.0 09/28/2023 1118   O2SAT 65 09/28/2023 1118    O2SAT 64 09/28/2023 1118   CBG (last 3)  Recent Labs    11/17/23 2030 11/18/23 0006 11/18/23 0443  GLUCAP 134* 148* 124*    Assessment/Plan: S/P Procedure(s) (LRB): Transcatheter Aortic Valve Replacement-Subclavian (Left) TRANSESOPHAGEAL ECHOCARDIOGRAM (N/A)  The left subclavicular incision is healing well and the seroma/hematoma is stable. There is no sign of infection with normal WBC ct and no fever. With his pancreatitis I would leave this alone unless it starts to drain spontaneously or looks inflamed/infected. Most of these will resolve on their own. I reviewed the CT and this looks subcutaneous. There is no significant fluid below the pectoralis muscles. I don't think anticoagulation is going to change this at this point.   LOS: 10 days    Alleen Borne 11/18/2023

## 2023-11-18 NOTE — Plan of Care (Signed)

## 2023-11-18 NOTE — Progress Notes (Signed)
[facility] PROGRESS NOTE   Tony Zhang MWN:[id number] DOB: May 10, 1946 DOA: 11/08/2023  PCP: System, Provider Not In  Brief History: 78 y.o. male with past medical history of hypertension, dyslipidemia, CAD, severe aortic stenosis, PVD, history of recurrent venous thrombosis, presented for transcatheter aortic valve replacement.  Following the procedure performed on 1/21 patient reported developing severe abdominal pain.  He was found to have pancreatitis.  Then he required transfer to ICU due to hypotension.  He was stabilized and then transferred back to the floor.  Cardiology and general surgery continues to follow.  There was also concern for a subclavian site hematoma which seems to be stable.  He was seen by cardiothoracic surgery as well.    Consultants: General surgery.  Cardiology.  Cardiothoracic surgery.   Subjective/Interval History: Patient mentions that his abdominal pain may be slightly better today.  Starting to pass some gas.  No bowel movements yet.  No vomiting in the last 24 hours.     Assessment/Plan:  Acute biliary pancreatitis CT scan showed acute pancreatitis.  Evidence for cholelithiasis.  Patient also underwent ultrasound.  Lipase level was initially in the 600s.  Improvement noted. Underwent MRI/MRCP which showed again cholelithiasis without cholecystitis.  No choledocholithiasis noted. General surgery is following.  Initially plan was for surgery in the future however cardiology thinks he will be low risk.  So they are considering operating during this hospital stay though he is not ready for surgery yet. Cortrak was placed due to poor oral intake.  Tube feedings initiated.  If he does not tolerate this then general surgery is considering initiating TPN. Seems to be tolerating his tube feedings.  Feeding rate to be increased today.   Acute Respiratory Failure with Hypoxia Imaging studies showed emphysema with chronic lung changes.   Continue with  nebulizer treatments and inhalers.   Continue with Flutter Valve, Incentive Spirometry, and Guaifenesin 1200 mg po BID Does not use oxygen at home.   Seems to be saturating well on 4 L of oxygen by nasal cannula.     Transient Hypotension, improved In the setting of Hypovolemia after Furosemide and Metoprolol Administration as well as having poor Po Intake Blood pressures have stabilized.  Continue to monitor.   He is not noted to be on any blood pressure lowering agents at this time.   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  Cardiology follows closely. Continue to hold Metoprolol, Jardiance   Acute Urinary Retention Continue with Foley catheter.  Voiding trial can be attempted once more ambulatory. -C/w Tamsulosin 0.8 mg po at bedtime for now   Hematuria -Has a Hx of Prostatomegaly and foley had been placed this Admission -Worsened in the setting of AC -This was discussed with Urology Dr. Berneice Heinrich who recommended Supportive Care  Hematuria appears to have improved off of anticoagulation.  Hemoglobin has been stable.  Severe Aortic Stenosis -Status post TAVR during this admission. Cardiology following and managing.  Off of anticoagulation due to subclavian site hematoma.   Previous RLL filling defect in bronchus -Possibly mucus plugging.    Subclavian site hematoma -Worsened in the setting of his heparin drip Seen by thoracic surgery.  No intervention planned.  Anticoagulation has been discontinued.   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  Now off of anticoagulation due to subclavian site hematoma and hematuria. No positive Doppler studies noted in our system.  His  last Doppler was in 2020 which was negative for DVT.   Hypomagnesemia/hypophosphatemia/hyponatremia Continue to monitor   AKI on CKD Stage 3b, improving and stable Metabolic Acidosis, mild Renal function has improved and stable for the most part.  Monitor urine output.  Avoid nephrotoxic agents.   DVT Prophylaxis: Subcutaneous heparin Code Status: Full code Family Communication: Discussed with patient Disposition Plan: To be determined     Medications: Scheduled:  atorvastatin  20 mg Per Tube Daily   Chlorhexidine Gluconate Cloth  6 each Topical Daily   cilostazol  100 mg Per Tube Daily   docusate  100 mg Per Tube BID   feeding supplement (PROSource TF20)  60 mL Per Tube Daily   fluticasone furoate-vilanterol  1 puff Inhalation Daily   gabapentin  400 mg Per Tube Q12H   heparin injection (subcutaneous)  5,000 Units Subcutaneous Q8H   ipratropium  0.5 mg Nebulization BID   levalbuterol  0.63 mg Nebulization BID   metoprolol tartrate  25 mg Per Tube BID   polyethylene glycol  17 g Per Tube Daily   sodium chloride flush  3 mL Intravenous Q12H   thiamine  100 mg Per Tube Daily   Continuous:  feeding supplement (OSMOLITE 1.5 CAL) 1,000 mL (11/18/23 1004)   ZOX:WRUEAVWUJWJXB (TYLENOL) oral liquid 160 mg/5 mL, dextrose, guaiFENesin, hydrALAZINE, HYDROmorphone (DILAUDID) injection, ondansetron (ZOFRAN) IV, oxyCODONE, sodium chloride flush  Antibiotics: Anti-infectives (From admission, onward)    Start     Dose/Rate Route Frequency Ordered Stop   11/08/23 2100  ceFAZolin (ANCEF) IVPB 2g/100 mL premix        2 g 200 mL/hr over 30 Minutes Intravenous Every 8 hours 11/08/23 1647 11/09/23 0548   11/08/23 0400  ceFAZolin (ANCEF) IVPB 2g/100 mL premix  Status:  Discontinued        2 g 200 mL/hr over 30 Minutes Intravenous To Surgery 11/07/23 1323 11/08/23 1514       Objective:  Vital Signs  Vitals:   11/18/23 0405 11/18/23 0442 11/18/23 0805 11/18/23 0914  BP: (!) 152/49  (!) 130/50 137/70 (!) 160/50  Pulse: 88 86 96 80  Resp: 19 18 20    Temp: 98.1 F (36.7 C) 98.1 F (36.7 C) 98.5 F (36.9 C)   TempSrc: Oral Oral Oral   SpO2: 95% 93% 93%   Weight:  93.1 kg    Height:        Intake/Output Summary (Last 24 hours) at 11/18/2023 1055 Last data filed at 11/18/2023 0500 Gross per 24 hour  Intake --  Output 1600 ml  Net -1600 ml   Filed Weights   11/16/23 0451 11/17/23 0300 11/18/23 0442  Weight: 100.1 kg 99.7 kg 93.1 kg    General appearance: Awake alert.  In no distress Resp: Clear to auscultation bilaterally.  Normal effort Cardio: S1-S2 is normal regular.  No S3-S4.  No rubs murmurs or bruit GI: Abdomen is distended.  tender diffusely.  No rebound rigidity or guarding.  Bowel sounds present.  More active compared to yesterday. Extremities: No edema.  Full range of motion of lower extremities. Neurologic: No focal neurological deficits.   Lab Results:  Data Reviewed: I have personally reviewed following labs and reports of the imaging studies  CBC: Recent Labs  Lab  11/13/23 0355 11/14/23 0300 11/15/23 0336 11/15/23 1458 11/16/23 0400 11/17/23 0804  WBC 8.8 7.2 7.5 7.6 7.2 8.6  NEUTROABS 7.6 5.9 6.0  --  5.8  --   HGB 13.1 12.6* 12.9* 13.3 12.7* 12.9*  HCT 39.4 38.7* 39.2 39.7 39.0 39.8  MCV 94.7 95.3 95.1 93.9 94.9 95.2  PLT 177 187 186 223 218 265    Basic Metabolic Panel: Recent Labs  Lab 11/13/23 0355 11/14/23 0300 11/15/23 0336 11/16/23 0400 11/17/23 0804 11/17/23 2009  NA 135 136 132* 131* 135  --   K 4.5 4.4 4.2 4.4 4.3  --   CL 105 106 103 101 102  --   CO2 21* 21* 20* 20* 19*  --   GLUCOSE 83 112* 105* 81 122*  --   BUN 29* 23 18 16 20   --   CREATININE 1.54* 1.55* 1.54* 1.48* 1.41*  --   CALCIUM 8.1* 8.3* 8.5* 8.4* 8.5*  --   MG 1.6* 2.2 1.9 1.7 1.7 1.7  PHOS 2.4* 2.8 2.6 2.5 2.6 2.4*    GFR: Estimated Creatinine Clearance: 48.8 mL/min (A) (by C-G formula based on SCr of 1.41 mg/dL (H)).  Liver Function  Tests: Recent Labs  Lab 11/13/23 0355 11/14/23 0300 11/15/23 0336 11/16/23 0400 11/17/23 0804  AST 44*  37 25 33 36 37  ALT 11  10 13 17 20 23   ALKPHOS 50  47 51 56 68 73  BILITOT 1.1  1.0 0.8 1.2 1.2 1.1  PROT 5.8*  5.7* 5.7* 5.9* 5.8* 6.3*  ALBUMIN 2.3*  2.3* 2.3* 2.3* 2.2* 2.4*    Recent Labs  Lab 11/13/23 0355 11/14/23 0300 11/15/23 0336 11/16/23 0400 11/17/23 0804  LIPASE 69* 71* 62* 56* 54*    Coagulation Profile: Recent Labs  Lab 11/12/23 0320 11/13/23 0355 11/14/23 0300 11/15/23 0336 11/16/23 0400  INR 1.4* 1.4* 1.7* 2.0* 1.9*    CBG: Recent Labs  Lab 11/17/23 1629 11/17/23 2030 11/18/23 0006 11/18/23 0443 11/18/23 0801  GLUCAP 121* 134* 148* 124* 132*    Radiology Studies: DG Abd Portable 1V Result Date: 11/16/2023 CLINICAL DATA:  Feeding tube placement. EXAM: PORTABLE ABDOMEN - 1 VIEW COMPARISON:  CT abdomen pelvis dated 11/10/2023. FINDINGS: Feeding tube with weighted tip in the left hemiabdomen in the region of the distal duodenum. IMPRESSION: Feeding tube with tip in the distal duodenum. Electronically Signed   By: Elgie Collard M.D.   On: 11/16/2023 14:26       LOS: 10 days   Masao Junker Rito Ehrlich  [facility] Pager on [email or web address]  11/18/2023, 10:55 AM

## 2023-11-19 DIAGNOSIS — J9601 Acute respiratory failure with hypoxia: Secondary | ICD-10-CM | POA: Diagnosis not present

## 2023-11-19 DIAGNOSIS — I739 Peripheral vascular disease, unspecified: Secondary | ICD-10-CM | POA: Diagnosis not present

## 2023-11-19 DIAGNOSIS — Z952 Presence of prosthetic heart valve: Secondary | ICD-10-CM | POA: Diagnosis not present

## 2023-11-19 DIAGNOSIS — I2510 Atherosclerotic heart disease of native coronary artery without angina pectoris: Secondary | ICD-10-CM | POA: Diagnosis not present

## 2023-11-19 DIAGNOSIS — K851 Biliary acute pancreatitis without necrosis or infection: Secondary | ICD-10-CM | POA: Diagnosis not present

## 2023-11-19 DIAGNOSIS — T148XXA Other injury of unspecified body region, initial encounter: Secondary | ICD-10-CM

## 2023-11-19 LAB — GLUCOSE, CAPILLARY
Glucose-Capillary: 117 mg/dL — ABNORMAL HIGH (ref 70–99)
Glucose-Capillary: 134 mg/dL — ABNORMAL HIGH (ref 70–99)
Glucose-Capillary: 146 mg/dL — ABNORMAL HIGH (ref 70–99)
Glucose-Capillary: 159 mg/dL — ABNORMAL HIGH (ref 70–99)
Glucose-Capillary: 161 mg/dL — ABNORMAL HIGH (ref 70–99)
Glucose-Capillary: 174 mg/dL — ABNORMAL HIGH (ref 70–99)

## 2023-11-19 LAB — PROTIME-INR
INR: 1.2 (ref 0.8–1.2)
Prothrombin Time: 15.1 s (ref 11.4–15.2)

## 2023-11-19 LAB — MAGNESIUM
Magnesium: 1.8 mg/dL (ref 1.7–2.4)
Magnesium: 1.7 mg/dL (ref 1.7–2.4)

## 2023-11-19 LAB — PHOSPHORUS
Phosphorus: 2.9 mg/dL (ref 2.5–4.6)
Phosphorus: 2.4 mg/dL — ABNORMAL LOW (ref 2.5–4.6)

## 2023-11-19 MED ORDER — ASPIRIN 81 MG PO CHEW
81.0000 mg | CHEWABLE_TABLET | Freq: Every day | ORAL | Status: DC
  Administered 2023-11-19 – 2023-11-21 (×3): 81 mg via ORAL
  Filled 2023-11-19 (×3): qty 1

## 2023-11-19 NOTE — Progress Notes (Signed)
11 Days Post-Op   Subjective/Chief Complaint: Upper abdominal pain about the same as yesterday. Tolerating tube feeds   Objective: Vital signs in last 24 hours: Temp:  [97.6 F (36.4 C)-98.4 F (36.9 C)] 97.9 F (36.6 C) (02/01 0737) Pulse Rate:  [73-92] 87 (02/01 0814) Resp:  [14-22] 18 (02/01 0814) BP: (110-149)/(42-68) 149/67 (02/01 0737) SpO2:  [93 %-98 %] 98 % (02/01 0814) Weight:  [98.8 kg] 98.8 kg (02/01 0409) Last BM Date : 11/07/23  Intake/Output from previous day: 01/31 0701 - 02/01 0700 In: 1045.7 [NG/GT:1045.7] Out: 525 [Urine:525] Intake/Output this shift: No intake/output data recorded.  Exam: Awake and alert Hematoma on left upper chest Abdomen still moderately tender with guarding and fullness in the epigastrium  Lab Results:  Recent Labs    11/17/23 0804 11/18/23 1228  WBC 8.6 9.8  HGB 12.9* 14.1  HCT 39.8 42.4  PLT 265 308   BMET Recent Labs    11/17/23 0804  NA 135  K 4.3  CL 102  CO2 19*  GLUCOSE 122*  BUN 20  CREATININE 1.41*  CALCIUM 8.5*   PT/INR Recent Labs    11/18/23 1228 11/19/23 0810  LABPROT 16.5* 15.1  INR 1.3* 1.2   ABG No results for input(s): "PHART", "HCO3" in the last 72 hours.  Invalid input(s): "PCO2", "PO2"  Studies/Results: No results found.  Anti-infectives: Anti-infectives (From admission, onward)    Start     Dose/Rate Route Frequency Ordered Stop   11/08/23 2100  ceFAZolin (ANCEF) IVPB 2g/100 mL premix        2 g 200 mL/hr over 30 Minutes Intravenous Every 8 hours 11/08/23 1647 11/09/23 0548   11/08/23 0400  ceFAZolin (ANCEF) IVPB 2g/100 mL premix  Status:  Discontinued        2 g 200 mL/hr over 30 Minutes Intravenous To Surgery 11/07/23 1323 11/08/23 1514       Assessment/Plan: Tony Zhang is an 78 y.o. male status post TAVR on 1/21 with subsequent development of abdominal pain and workup concerning for acute pancreatitis secondary to gallstones   - Previously discussed cardiac risk  with primary team, cardiology, given recent TAVR.  Although ideally the timeframe is 1 month post TAVR for elective surgery, given the patient is quite symptomatic, cardiology states low enough risk that can consider surgery during this hospitalization. We will see how patient progresses and determine if appropriate for surgery during hospitalization. - Hold Coumadin, okay for heparin drip - currently on hold with chest wall hematoma  - Abdominal exam improving.  - Cortrak in place. OK to advance tube feeds  -given exam, he is not ready for surgery today or tomorrow.  Will likely be Monday or Tuesday.  He still would like to proceed this admission bien need for anticoag meds at discharge  Abigail Miyamoto MD 11/19/2023

## 2023-11-19 NOTE — Progress Notes (Signed)
[facility] PROGRESS NOTE   Tony Zhang YQM:[id number] DOB: 1946/07/22 DOA: 11/08/2023  PCP: System, Provider Not In  Brief History: 78 y.o. male with past medical history of hypertension, dyslipidemia, CAD, severe aortic stenosis, PVD, history of recurrent venous thrombosis, presented for transcatheter aortic valve replacement.  Following the procedure performed on 1/21 patient reported developing severe abdominal pain.  He was found to have pancreatitis.  Then he required transfer to ICU due to hypotension.  He was stabilized and then transferred back to the floor.  Cardiology and general surgery continues to follow.  There was also concern for a subclavian site hematoma which seems to be stable.  He was seen by cardiothoracic surgery as well.    Consultants: General surgery.  Cardiology.  Cardiothoracic surgery.   Subjective/Interval History: Patient mentioned that his abdominal discomfort has improved in the last few days.  Denies any nausea or vomiting.  No chest pain.  Shortness of breath is about the same.     Assessment/Plan:  Acute biliary pancreatitis CT scan showed acute pancreatitis.  Evidence for cholelithiasis.  Patient also underwent ultrasound.  Lipase level was initially in the 600s.  Improvement noted. Underwent MRI/MRCP which showed again cholelithiasis without cholecystitis.  No choledocholithiasis noted. General surgery is following.  Initially plan was for surgery in the future however cardiology thinks he will be low risk.  So they are considering operating during this hospital stay though he is not ready for surgery yet. Cortrak was placed due to poor oral intake.  Tube feedings initiated.  Seems to be tolerating well.  Rate was increased yesterday.  General surgery plans to do cholecystectomy either on Monday or Tuesday depending on his clinical progress.   Acute Respiratory Failure with Hypoxia Imaging studies showed emphysema with chronic lung changes.    Continue with nebulizer treatments and inhalers.   Continue with Flutter Valve, Incentive Spirometry, and Guaifenesin 1200 mg po BID Does not use oxygen at home.   Seems to be saturating well on 4 L of oxygen by nasal cannula.     Transient Hypotension, improved In the setting of Hypovolemia after Furosemide and Metoprolol Administration as well as having poor Po Intake Blood pressures have stabilized.  Continue to monitor.   He is not noted to be on any blood pressure lowering agents at this time.   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  Cardiology follows closely. Continue to hold Metoprolol, Jardiance   Acute Urinary Retention Continue with Foley catheter.  Voiding trial can be attempted once more ambulatory. -C/w Tamsulosin 0.8 mg po at bedtime for now   Hematuria -Has a Hx of Prostatomegaly and foley had been placed this Admission -Worsened in the setting of AC -This was discussed with Urology Dr. Berneice Heinrich who recommended Supportive Care  Hematuria appears to have improved off of anticoagulation.  Hemoglobin has been stable.  Severe Aortic Stenosis -Status post TAVR during this admission. Cardiology following and managing.  Off of anticoagulation due to subclavian site hematoma.   Previous RLL filling defect in bronchus -Possibly mucus plugging.    Subclavian site hematoma -Worsened in the setting of heparin drip Seen by thoracic surgery.  No intervention planned.   Anticoagulation has been discontinued for now.   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  Now off of anticoagulation due to subclavian site hematoma and hematuria. No positive Doppler studies  noted in our system.  His last Doppler was in 2020 which was negative for DVT.   Hypomagnesemia/hypophosphatemia/hyponatremia Continue to monitor   AKI on CKD Stage 3b, improving and stable Metabolic Acidosis, mild Renal function has improved and stable for the most part.  Monitor urine output.  Avoid nephrotoxic agents.   DVT Prophylaxis: Subcutaneous heparin Code Status: Full code Family Communication: Discussed with patient Disposition Plan: To be determined     Medications: Scheduled:  atorvastatin  20 mg Per Tube Daily   Chlorhexidine Gluconate Cloth  6 each Topical Daily   cilostazol  100 mg Per Tube Daily   docusate  100 mg Per Tube BID   feeding supplement (PROSource TF20)  60 mL Per Tube Daily   fluticasone furoate-vilanterol  1 puff Inhalation Daily   gabapentin  400 mg Per Tube Q12H   heparin injection (subcutaneous)  5,000 Units Subcutaneous Q8H   ipratropium  0.5 mg Nebulization BID   levalbuterol  0.63 mg Nebulization BID   metoprolol tartrate  25 mg Per Tube BID   polyethylene glycol  17 g Per Tube Daily   sodium chloride flush  3 mL Intravenous Q12H   thiamine  100 mg Per Tube Daily   Continuous:  feeding supplement (OSMOLITE 1.5 CAL) 40 mL/hr at 11/18/23 2200   ZOX:WRUEAVWUJWJXB (TYLENOL) oral liquid 160 mg/5 mL, dextrose, guaiFENesin, hydrALAZINE, HYDROmorphone (DILAUDID) injection, ondansetron (ZOFRAN) IV, oxyCODONE, sodium chloride flush  Antibiotics: Anti-infectives (From admission, onward)    Start     Dose/Rate Route Frequency Ordered Stop   11/08/23 2100  ceFAZolin (ANCEF) IVPB 2g/100 mL premix        2 g 200 mL/hr over 30 Minutes Intravenous Every 8 hours 11/08/23 1647 11/09/23 0548   11/08/23 0400  ceFAZolin (ANCEF) IVPB 2g/100 mL premix  Status:  Discontinued        2 g 200 mL/hr over 30 Minutes Intravenous To Surgery 11/07/23 1323 11/08/23 1514       Objective:  Vital Signs  Vitals:   11/19/23 0409 11/19/23 0737 11/19/23 0813  11/19/23 0814  BP: (!) 115/42 (!) 149/67    Pulse: 73 81  87  Resp: 17 (!) 22  18  Temp: 97.6 F (36.4 C) 97.9 F (36.6 C)    TempSrc: Oral Oral    SpO2: 93% 96% 97% 98%  Weight: 98.8 kg     Height:        Intake/Output Summary (Last 24 hours) at 11/19/2023 1004 Last data filed at 11/19/2023 0400 Gross per 24 hour  Intake 1045.67 ml  Output 525 ml  Net 520.67 ml   Filed Weights   11/17/23 0300 11/18/23 0442 11/19/23 0409  Weight: 99.7 kg 93.1 kg 98.8 kg    General appearance: Awake alert.  In no distress Resp: Clear to auscultation bilaterally.  Normal effort Cardio: S1-S2 is normal regular.  No S3-S4.  No rubs murmurs or bruit GI: Abdomen is soft.  Nontender nondistended.  Bowel sounds are present normal.  No masses organomegaly    Lab Results:  Data Reviewed: I have personally reviewed following labs and reports of the imaging studies  CBC: Recent Labs  Lab 11/13/23 0355 11/14/23 0300 11/15/23 0336 11/15/23 1458 11/16/23 0400 11/17/23 0804 11/18/23 1228  WBC 8.8 7.2 7.5  7.6 7.2 8.6 9.8  NEUTROABS 7.6 5.9 6.0  --  5.8  --   --   HGB 13.1 12.6* 12.9* 13.3 12.7* 12.9* 14.1  HCT 39.4 38.7* 39.2 39.7 39.0 39.8 42.4  MCV 94.7 95.3 95.1 93.9 94.9 95.2 94.0  PLT 177 187 186 223 218 265 308    Basic Metabolic Panel: Recent Labs  Lab 11/13/23 0355 11/14/23 0300 11/15/23 0336 11/16/23 0400 11/17/23 0804 11/17/23 2009 11/18/23 1228 11/18/23 1653 11/19/23 0810  NA 135 136 132* 131* 135  --   --   --   --   K 4.5 4.4 4.2 4.4 4.3  --   --   --   --   CL 105 106 103 101 102  --   --   --   --   CO2 21* 21* 20* 20* 19*  --   --   --   --   GLUCOSE 83 112* 105* 81 122*  --   --   --   --   BUN 29* 23 18 16 20   --   --   --   --   CREATININE 1.54* 1.55* 1.54* 1.48* 1.41*  --   --   --   --   CALCIUM 8.1* 8.3* 8.5* 8.4* 8.5*  --   --   --   --   MG 1.6* 2.2 1.9 1.7 1.7 1.7 1.7 1.7 1.8  PHOS 2.4* 2.8 2.6 2.5 2.6 2.4* 2.3* 2.3* 2.9    GFR: Estimated Creatinine  Clearance: 53.4 mL/min (A) (by C-G formula based on SCr of 1.41 mg/dL (H)).  Liver Function Tests: Recent Labs  Lab 11/13/23 0355 11/14/23 0300 11/15/23 0336 11/16/23 0400 11/17/23 0804  AST 44*  37 25 33 36 37  ALT 11  10 13 17 20 23   ALKPHOS 50  47 51 56 68 73  BILITOT 1.1  1.0 0.8 1.2 1.2 1.1  PROT 5.8*  5.7* 5.7* 5.9* 5.8* 6.3*  ALBUMIN 2.3*  2.3* 2.3* 2.3* 2.2* 2.4*    Recent Labs  Lab 11/13/23 0355 11/14/23 0300 11/15/23 0336 11/16/23 0400 11/17/23 0804  LIPASE 69* 71* 62* 56* 54*    Coagulation Profile: Recent Labs  Lab 11/14/23 0300 11/15/23 0336 11/16/23 0400 11/18/23 1228 11/19/23 0810  INR 1.7* 2.0* 1.9* 1.3* 1.2    CBG: Recent Labs  Lab 11/18/23 1626 11/18/23 2031 11/18/23 2359 11/19/23 0411 11/19/23 0737  GLUCAP 142* 135* 161* 146* 117*    Radiology Studies: No results found.      LOS: 11 days   Marylan Glore [employer] on [email or web address]  11/19/2023, 10:04 AM

## 2023-11-19 NOTE — Plan of Care (Signed)
   Problem: Health Behavior/Discharge Planning: Goal: Ability to manage health-related needs will improve Outcome: Progressing   Problem: Clinical Measurements: Goal: Diagnostic test results will improve Outcome: Progressing

## 2023-11-19 NOTE — Plan of Care (Signed)

## 2023-11-19 NOTE — Progress Notes (Signed)
Progress Note  Patient Name: Tony Zhang Date of Encounter: 11/19/2023  Primary Cardiologist: Thurmon Fair, MD  Subjective   No acute events overnight.  No symptoms except for abdominal pain that was improved compared to yesterday.  Inpatient Medications    Scheduled Meds:  atorvastatin  20 mg Per Tube Daily   Chlorhexidine Gluconate Cloth  6 each Topical Daily   cilostazol  100 mg Per Tube Daily   docusate  100 mg Per Tube BID   feeding supplement (PROSource TF20)  60 mL Per Tube Daily   fluticasone furoate-vilanterol  1 puff Inhalation Daily   gabapentin  400 mg Per Tube Q12H   heparin injection (subcutaneous)  5,000 Units Subcutaneous Q8H   ipratropium  0.5 mg Nebulization BID   levalbuterol  0.63 mg Nebulization BID   metoprolol tartrate  25 mg Per Tube BID   polyethylene glycol  17 g Per Tube Daily   sodium chloride flush  3 mL Intravenous Q12H   thiamine  100 mg Per Tube Daily   Continuous Infusions:  feeding supplement (OSMOLITE 1.5 CAL) 40 mL/hr at 11/18/23 2200   PRN Meds: acetaminophen (TYLENOL) oral liquid 160 mg/5 mL, dextrose, guaiFENesin, hydrALAZINE, HYDROmorphone (DILAUDID) injection, ondansetron (ZOFRAN) IV, oxyCODONE, sodium chloride flush   Vital Signs    Vitals:   11/19/23 0409 11/19/23 0737 11/19/23 0813 11/19/23 0814  BP: (!) 115/42 (!) 149/67    Pulse: 73 81  87  Resp: 17 (!) 22  18  Temp: 97.6 F (36.4 C) 97.9 F (36.6 C)    TempSrc: Oral Oral    SpO2: 93% 96% 97% 98%  Weight: 98.8 kg     Height:        Intake/Output Summary (Last 24 hours) at 11/19/2023 1118 Last data filed at 11/19/2023 0400 Gross per 24 hour  Intake 1045.67 ml  Output 525 ml  Net 520.67 ml   Filed Weights   11/17/23 0300 11/18/23 0442 11/19/23 0409  Weight: 99.7 kg 93.1 kg 98.8 kg    Telemetry     Personally reviewed.  No arms  ECG    Not performed today  Physical Exam   GEN: No acute distress.   Neck: No JVD. Cardiac: RRR, no murmur, rub, or  gallop.  Respiratory: Nonlabored. Clear to auscultation bilaterally. GI: Soft, nontender, bowel sounds present. MS: No edema; No deformity. Neuro:  Nonfocal. Psych: Alert and oriented x 3. Normal affect.  Labs    Chemistry Recent Labs  Lab 11/15/23 0336 11/16/23 0400 11/17/23 0804  NA 132* 131* 135  K 4.2 4.4 4.3  CL 103 101 102  CO2 20* 20* 19*  GLUCOSE 105* 81 122*  BUN 18 16 20   CREATININE 1.54* 1.48* 1.41*  CALCIUM 8.5* 8.4* 8.5*  PROT 5.9* 5.8* 6.3*  ALBUMIN 2.3* 2.2* 2.4*  AST 33 36 37  ALT 17 20 23   ALKPHOS 56 68 73  BILITOT 1.2 1.2 1.1  GFRNONAA 46* 48* 51*  ANIONGAP 9 10 14      Hematology Recent Labs  Lab 11/16/23 0400 11/17/23 0804 11/18/23 1228  WBC 7.2 8.6 9.8  RBC 4.11* 4.18* 4.51  HGB 12.7* 12.9* 14.1  HCT 39.0 39.8 42.4  MCV 94.9 95.2 94.0  MCH 30.9 30.9 31.3  MCHC 32.6 32.4 33.3  RDW 15.2 15.1 15.2  PLT 218 265 308    Cardiac EnzymesNo results for input(s): "TROPONINIHS" in the last 720 hours.  BNPNo results for input(s): "BNP", "PROBNP" in the last 168  hours.   DDimerNo results for input(s): "DDIMER" in the last 168 hours.   Radiology    No results found.   Assessment & Plan    Severe AS s/p TAVR Left subclavicular seroma/hematoma Acute gallstone pancreatitis CAD s/p LAD PCI in 2023 PAD, extensive on medical management Chronic hypoxic respiratory failure secondary to COPD History of DVT/PE on Coumadin Aortic aneurysm 4.1 cm   -Compensated, no angina or DOE, echocardiogram post TAVR showed normal gradients. Hospital course complicated by acute gallstone pancreatitis, currently abdominal pain is tolerable and improved compared to before. General surgery team following and hopefully in the next couple of days, will undergo surgery. Okay from cardiac standpoint (due to persistent symptoms). Currently on cilostazol, will hold it for the surgery.  Otherwise continue cardioprotective medications, atorvastatin 20 mg nightly, metoprolol  tartrate 25 mg twice daily. -Left subclavicular seroma/hematoma improving, CT surgery following. Initially was on Coumadin (for chronic PE/DVT) that was switched to heparin drip, currently on hold due to left subclavicular seroma versus hematoma.  As Coumadin /heparin drip is on hold, will start aspirin 81 mg once daily. -Outpatient surveillance of aortic aneurysm.  Signed, Marjo Bicker, MD  11/19/2023, 11:18 AM

## 2023-11-20 DIAGNOSIS — J9601 Acute respiratory failure with hypoxia: Secondary | ICD-10-CM | POA: Diagnosis not present

## 2023-11-20 DIAGNOSIS — E875 Hyperkalemia: Secondary | ICD-10-CM

## 2023-11-20 DIAGNOSIS — Z952 Presence of prosthetic heart valve: Secondary | ICD-10-CM | POA: Diagnosis not present

## 2023-11-20 DIAGNOSIS — K851 Biliary acute pancreatitis without necrosis or infection: Secondary | ICD-10-CM | POA: Diagnosis not present

## 2023-11-20 DIAGNOSIS — I2510 Atherosclerotic heart disease of native coronary artery without angina pectoris: Secondary | ICD-10-CM | POA: Diagnosis not present

## 2023-11-20 DIAGNOSIS — I739 Peripheral vascular disease, unspecified: Secondary | ICD-10-CM | POA: Diagnosis not present

## 2023-11-20 LAB — COMPREHENSIVE METABOLIC PANEL
ALT: 28 U/L (ref 0–44)
AST: 36 U/L (ref 15–41)
Albumin: 2.5 g/dL — ABNORMAL LOW (ref 3.5–5.0)
Alkaline Phosphatase: 63 U/L (ref 38–126)
Anion gap: 9 (ref 5–15)
BUN: 32 mg/dL — ABNORMAL HIGH (ref 8–23)
CO2: 26 mmol/L (ref 22–32)
Calcium: 8.9 mg/dL (ref 8.9–10.3)
Chloride: 100 mmol/L (ref 98–111)
Creatinine, Ser: 1.44 mg/dL — ABNORMAL HIGH (ref 0.61–1.24)
GFR, Estimated: 50 mL/min — ABNORMAL LOW (ref >60)
Glucose, Bld: 159 mg/dL — ABNORMAL HIGH (ref 70–99)
Potassium: 5.5 mmol/L — ABNORMAL HIGH (ref 3.5–5.1)
Sodium: 135 mmol/L (ref 135–145)
Total Bilirubin: 1.7 mg/dL — ABNORMAL HIGH (ref 0.0–1.2)
Total Protein: 6.4 g/dL — ABNORMAL LOW (ref 6.5–8.1)

## 2023-11-20 LAB — GLUCOSE, CAPILLARY
Glucose-Capillary: 126 mg/dL — ABNORMAL HIGH (ref 70–99)
Glucose-Capillary: 143 mg/dL — ABNORMAL HIGH (ref 70–99)
Glucose-Capillary: 155 mg/dL — ABNORMAL HIGH (ref 70–99)
Glucose-Capillary: 165 mg/dL — ABNORMAL HIGH (ref 70–99)
Glucose-Capillary: 168 mg/dL — ABNORMAL HIGH (ref 70–99)

## 2023-11-20 LAB — PROTIME-INR
INR: 1.2 (ref 0.8–1.2)
Prothrombin Time: 14.9 s (ref 11.4–15.2)

## 2023-11-20 MED ORDER — SODIUM ZIRCONIUM CYCLOSILICATE 10 G PO PACK
10.0000 g | PACK | Freq: Once | ORAL | Status: AC
  Administered 2023-11-20: 10 g via ORAL
  Filled 2023-11-20: qty 1

## 2023-11-20 NOTE — Progress Notes (Addendum)
12 Days Post-Op   Subjective/Chief Complaint: Feels better than yesterday with much less abdominal pain Tolerating tube feeds   Objective: Vital signs in last 24 hours: Temp:  [97.8 F (36.6 C)-98.8 F (37.1 C)] 98.8 F (37.1 C) (02/02 0347) Pulse Rate:  [76-93] 93 (02/02 0347) Resp:  [16-20] 20 (02/02 0347) BP: (95-162)/(45-78) 132/72 (02/02 0347) SpO2:  [93 %-97 %] 96 % (02/02 0347) Weight:  [98.7 kg] 98.7 kg (02/02 0347) Last BM Date : 12/08/23  Intake/Output from previous day: 02/01 0701 - 02/02 0700 In: 1329.2 [NG/GT:1329.2] Out: 1025 [Urine:1025] Intake/Output this shift: No intake/output data recorded.  Exam: Awake and alert Abdomen much softer with now minimal epigastric tenderness/guarding  Lab Results:  Recent Labs    11/18/23 1228  WBC 9.8  HGB 14.1  HCT 42.4  PLT 308   BMET No results for input(s): "NA", "K", "CL", "CO2", "GLUCOSE", "BUN", "CREATININE", "CALCIUM" in the last 72 hours. PT/INR Recent Labs    11/19/23 0810 11/20/23 0313  LABPROT 15.1 14.9  INR 1.2 1.2   ABG No results for input(s): "PHART", "HCO3" in the last 72 hours.  Invalid input(s): "PCO2", "PO2"  Studies/Results: No results found.  Anti-infectives: Anti-infectives (From admission, onward)    Start     Dose/Rate Route Frequency Ordered Stop   11/08/23 2100  ceFAZolin (ANCEF) IVPB 2g/100 mL premix        2 g 200 mL/hr over 30 Minutes Intravenous Every 8 hours 11/08/23 1647 11/09/23 0548   11/08/23 0400  ceFAZolin (ANCEF) IVPB 2g/100 mL premix  Status:  Discontinued        2 g 200 mL/hr over 30 Minutes Intravenous To Surgery 11/07/23 1323 11/08/23 1514       Assessment/Plan: Tony Zhang is an 78 y.o. male status post TAVR on 1/21 with subsequent development of abdominal pain and workup concerning for acute pancreatitis secondary to gallstones   - Previously discussed cardiac risk with primary team, cardiology, given recent TAVR. Although ideally the timeframe is  1 month post TAVR for elective surgery, given the patient is quite symptomatic, cardiology states low enough risk that can consider surgery during this hospitalization.   -given improvement, he may be ready for a lap chole and c-gram tomorrow.  Will make NPO/hold tube feeds at midnight tonight and hold any heparin.  Exact timing not currently certain. He agrees with the plan  Abigail Miyamoto MD 11/20/2023

## 2023-11-20 NOTE — Progress Notes (Signed)
Mobility Specialist Progress Note:   11/20/23 1356  Mobility  Activity Ambulated with assistance in room  Level of Assistance  (MinG)  Assistive Device Front wheel walker  Distance Ambulated (ft) 50 ft  Activity Response Tolerated well  Mobility Referral Yes  Mobility visit 1 Mobility  Mobility Specialist Start Time (ACUTE ONLY) 1305  Mobility Specialist Stop Time (ACUTE ONLY) 1320  Mobility Specialist Time Calculation (min) (ACUTE ONLY) 15 min   During Mobility: 86 HR , 97% SpO2 4 L  Pt received in bed, agreeable to mobility. SB bed mobility. MinG during ambulation for safety. VSS on 4 L. Pt denied any discomfort during ambulation, asx throughout. Pt requesting to sit up in chair. Pt left in chair with call bell in reach and all needs met.   Tony Zhang  Mobility Specialist Please contact via [employer] office at [phone]

## 2023-11-20 NOTE — Progress Notes (Signed)
Progress Note  Patient Name: Tony Zhang Date of Encounter: 11/20/2023  Primary Cardiologist: Thurmon Fair, MD  Subjective   No acute events overnight.  No symptoms except for some abdominal pain.  Inpatient Medications    Scheduled Meds:  aspirin  81 mg Oral Daily   atorvastatin  20 mg Per Tube Daily   Chlorhexidine Gluconate Cloth  6 each Topical Daily   docusate  100 mg Per Tube BID   feeding supplement (PROSource TF20)  60 mL Per Tube Daily   fluticasone furoate-vilanterol  1 puff Inhalation Daily   gabapentin  400 mg Per Tube Q12H   heparin injection (subcutaneous)  5,000 Units Subcutaneous Q8H   ipratropium  0.5 mg Nebulization BID   levalbuterol  0.63 mg Nebulization BID   metoprolol tartrate  25 mg Per Tube BID   polyethylene glycol  17 g Per Tube Daily   sodium chloride flush  3 mL Intravenous Q12H   Continuous Infusions:  feeding supplement (OSMOLITE 1.5 CAL) 65 mL/hr at 11/20/23 0350   PRN Meds: acetaminophen (TYLENOL) oral liquid 160 mg/5 mL, dextrose, guaiFENesin, hydrALAZINE, HYDROmorphone (DILAUDID) injection, ondansetron (ZOFRAN) IV, oxyCODONE, sodium chloride flush   Vital Signs    Vitals:   11/19/23 2035 11/19/23 2232 11/20/23 0012 11/20/23 0347  BP: (!) 98/49 (!) 109/45 95/78 132/72  Pulse: 90 90 87 93  Resp: 20  17 20   Temp: 97.9 F (36.6 C)  98.3 F (36.8 C) 98.8 F (37.1 C)  TempSrc: Oral  Oral Oral  SpO2: 94%  93% 96%  Weight:    98.7 kg  Height:        Intake/Output Summary (Last 24 hours) at 11/20/2023 1040 Last data filed at 11/20/2023 0359 Gross per 24 hour  Intake 1329.17 ml  Output 1025 ml  Net 304.17 ml   Filed Weights   11/18/23 0442 11/19/23 0409 11/20/23 0347  Weight: 93.1 kg 98.8 kg 98.7 kg    Telemetry     Personally reviewed.  No alarms.  ECG    Not performed today  Physical Exam   GEN: No acute distress.   Neck: No JVD. Cardiac: RRR, no murmur, rub, or gallop.  Respiratory: Nonlabored. Clear to  auscultation bilaterally. GI: Soft, nontender, bowel sounds present. MS: No edema; No deformity. Neuro:  Nonfocal. Psych: Alert and oriented x 3. Normal affect.  Labs    Chemistry Recent Labs  Lab 11/16/23 0400 11/17/23 0804 11/20/23 0842  NA 131* 135 135  K 4.4 4.3 5.5*  CL 101 102 100  CO2 20* 19* 26  GLUCOSE 81 122* 159*  BUN 16 20 32*  CREATININE 1.48* 1.41* 1.44*  CALCIUM 8.4* 8.5* 8.9  PROT 5.8* 6.3* 6.4*  ALBUMIN 2.2* 2.4* 2.5*  AST 36 37 36  ALT 20 23 28   ALKPHOS 68 73 63  BILITOT 1.2 1.1 1.7*  GFRNONAA 48* 51* 50*  ANIONGAP 10 14 9      Hematology Recent Labs  Lab 11/16/23 0400 11/17/23 0804 11/18/23 1228  WBC 7.2 8.6 9.8  RBC 4.11* 4.18* 4.51  HGB 12.7* 12.9* 14.1  HCT 39.0 39.8 42.4  MCV 94.9 95.2 94.0  MCH 30.9 30.9 31.3  MCHC 32.6 32.4 33.3  RDW 15.2 15.1 15.2  PLT 218 265 308    Cardiac EnzymesNo results for input(s): "TROPONINIHS" in the last 720 hours.  BNPNo results for input(s): "BNP", "PROBNP" in the last 168 hours.   DDimerNo results for input(s): "DDIMER" in the last 168 hours.  Radiology    No results found.   Assessment & Plan    Severe AS s/p TAVR Left subclavicular seroma/hematoma Acute gallstone pancreatitis CAD s/p LAD PCI in 2023 PAD, extensive on medical management Chronic hypoxic respiratory failure secondary to COPD History of DVT/PE on Coumadin Aortic aneurysm 4.1 cm   -Compensated, no angina or DOE, echocardiogram post-TAVR showed normal gradients. Hospital course complicated by acute gallstone pancreatitis, currently abdominal pain is tolerable and improved compared to before. General surgery team following and hopefully the next couple of days, will undergo surgery. Okay from cardiac standpoint (due to persistent symptoms). Cilostazol was held yesterday. Otherwise continue current cardioprotective  medications, aspirin 81 mg once daily, atorvastatin 20 mg nightly, metoprolol tartarate 25 mg twice daily. -Left  subclavicular seroma/hematoma improving, CT surgery following. Initially was on Coumadin (for chronic PE/DVT) was switched to heparin drip, currently on hold due to left subclavicular serum versus hematoma. As Coumadin/heparin drip was on hold, aspirin 81 mg once daily was started. -Outpatient surveillance of aortic aneurysm.   Signed, Marjo Bicker, MD  11/20/2023, 10:40 AM

## 2023-11-20 NOTE — Plan of Care (Signed)

## 2023-11-20 NOTE — Progress Notes (Signed)
[facility] PROGRESS NOTE   Tony Zhang WJX:[id number] DOB: 1946-09-13 DOA: 11/08/2023  PCP: System, Provider Not In  Brief History: 78 y.o. male with past medical history of hypertension, dyslipidemia, CAD, severe aortic stenosis, PVD, history of recurrent venous thrombosis, presented for transcatheter aortic valve replacement.  Following the procedure performed on 1/21 patient reported developing severe abdominal pain.  He was found to have pancreatitis.  Then he required transfer to ICU due to hypotension.  He was stabilized and then transferred back to the floor.  Cardiology and general surgery continues to follow.  There was also concern for a subclavian site hematoma which seems to be stable.  He was seen by cardiothoracic surgery as well.    Consultants: General surgery.  Cardiology.  Cardiothoracic surgery.   Subjective/Interval History: No new complaints offered.  Abdomen feels much better.  No nausea or vomiting.  Passing gas from below.  No BMs yet.  No chest pain or shortness of breath more than usual.    Assessment/Plan:  Acute biliary pancreatitis CT scan showed acute pancreatitis.  Evidence for cholelithiasis.  Patient also underwent ultrasound.  Lipase level was initially in the 600s.  Improvement noted. Underwent MRI/MRCP which showed again cholelithiasis without cholecystitis.  No choledocholithiasis noted. General surgery is following.  Initially plan was for surgery in the future however cardiology thinks he will be low risk.  So they are considering operating during this hospital stay though he is not ready for surgery yet. Cortrak was placed due to poor oral intake.  Tube feedings initiated.  Seems to be tolerating well.   General surgery plans to do cholecystectomy either on Monday or Tuesday depending on his clinical progress.   Acute Respiratory Failure with Hypoxia Imaging studies showed emphysema with chronic lung changes.   Continue with nebulizer  treatments and inhalers.   Continue with Flutter Valve, Incentive Spirometry, and Guaifenesin 1200 mg po BID Does not use oxygen at home.   Seems to be saturating well on 4 L of oxygen by nasal cannula.  Continue to wean down as tolerated and to maintain sats greater than 90%.   Transient Hypotension, improved In the setting of Hypovolemia after Furosemide and Metoprolol Administration as well as having poor Po Intake Blood pressures have stabilized.  Continue to monitor.   He is not noted to be on any blood pressure lowering agents at this time.   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  Cardiology follows closely.  Acute Urinary Retention Continue with Foley catheter.  Voiding trial can be attempted once more ambulatory. -C/w Tamsulosin 0.8 mg po at bedtime for now   Hematuria -Has a Hx of Prostatomegaly and foley had been placed this Admission -Worsened in the setting of AC -This was discussed with Urology Dr. Berneice Heinrich who recommended Supportive Care  Hematuria appears to have improved off of anticoagulation.  Hemoglobin has been stable.  Severe Aortic Stenosis -Status post TAVR during this admission. Cardiology following and managing.  Off of anticoagulation due to subclavian site hematoma.   Previous RLL filling defect in bronchus -Possibly mucus plugging.    Subclavian site hematoma -Worsened in the setting of heparin drip Seen by thoracic surgery.  No intervention planned.   Anticoagulation has been discontinued for now.   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  Now off of anticoagulation due to subclavian site hematoma and hematuria. No positive Doppler  studies noted in our system.  His last Doppler was in 2020 which was negative for DVT.   Hypomagnesemia/hypophosphatemia/hyponatremia Continue to monitor   AKI on CKD Stage 3b, improving and stable Metabolic Acidosis, mild Renal function has improved and stable for the most part.  Monitor urine output.  Avoid nephrotoxic agents.  Hyperkalemia Potassium noted to be 5.5 this morning.  Not getting any potassium supplements.  Will give Lokelma x 1 and recheck labs tomorrow.   DVT Prophylaxis: Subcutaneous heparin Code Status: Full code Family Communication: Discussed with patient Disposition Plan: To be determined     Medications: Scheduled:  aspirin  81 mg Oral Daily   atorvastatin  20 mg Per Tube Daily   Chlorhexidine Gluconate Cloth  6 each Topical Daily   docusate  100 mg Per Tube BID   feeding supplement (PROSource TF20)  60 mL Per Tube Daily   fluticasone furoate-vilanterol  1 puff Inhalation Daily   gabapentin  400 mg Per Tube Q12H   heparin injection (subcutaneous)  5,000 Units Subcutaneous Q8H   ipratropium  0.5 mg Nebulization BID   levalbuterol  0.63 mg Nebulization BID   metoprolol tartrate  25 mg Per Tube BID   polyethylene glycol  17 g Per Tube Daily   sodium chloride flush  3 mL Intravenous Q12H   sodium zirconium cyclosilicate  10 g Oral Once   Continuous:  feeding supplement (OSMOLITE 1.5 CAL) 65 mL/hr at 11/20/23 0350   ZOX:WRUEAVWUJWJXB (TYLENOL) oral liquid 160 mg/5 mL, dextrose, guaiFENesin, hydrALAZINE, HYDROmorphone (DILAUDID) injection, ondansetron (ZOFRAN) IV, oxyCODONE, sodium chloride flush  Antibiotics: Anti-infectives (From admission, onward)    Start     Dose/Rate Route Frequency Ordered Stop   11/08/23 2100  ceFAZolin (ANCEF) IVPB 2g/100 mL premix        2 g 200 mL/hr over 30 Minutes Intravenous Every 8 hours 11/08/23 1647 11/09/23 0548   11/08/23 0400  ceFAZolin (ANCEF) IVPB 2g/100 mL premix  Status:  Discontinued        2 g 200 mL/hr  over 30 Minutes Intravenous To Surgery 11/07/23 1323 11/08/23 1514       Objective:  Vital Signs  Vitals:   11/19/23 2035 11/19/23 2232 11/20/23 0012 11/20/23 0347  BP: (!) 98/49 (!) 109/45 95/78 132/72  Pulse: 90 90 87 93  Resp: 20  17 20   Temp: 97.9 F (36.6 C)  98.3 F (36.8 C) 98.8 F (37.1 C)  TempSrc: Oral  Oral Oral  SpO2: 94%  93% 96%  Weight:    98.7 kg  Height:        Intake/Output Summary (Last 24 hours) at 11/20/2023 1048 Last data filed at 11/20/2023 0359 Gross per 24 hour  Intake 1329.17 ml  Output 1025 ml  Net 304.17 ml   Filed Weights   11/18/23 0442 11/19/23 0409 11/20/23 0347  Weight: 93.1 kg 98.8 kg 98.7 kg    General appearance: Awake alert.  In no distress Resp: Clear to auscultation bilaterally.  Normal effort Cardio: S1-S2 is normal regular.  No S3-S4.  No rubs murmurs or bruit GI: Abdomen is soft.  Nontender nondistended.  Bowel sounds are present. Extremities: No edema.  Full range of motion of lower extremities. Neurologic: Alert and oriented x3.  No focal  neurological deficits.      Lab Results:  Data Reviewed: I have personally reviewed following labs and reports of the imaging studies  CBC: Recent Labs  Lab 11/14/23 0300 11/15/23 0336 11/15/23 1458 11/16/23 0400 11/17/23 0804 11/18/23 1228  WBC 7.2 7.5 7.6 7.2 8.6 9.8  NEUTROABS 5.9 6.0  --  5.8  --   --   HGB 12.6* 12.9* 13.3 12.7* 12.9* 14.1  HCT 38.7* 39.2 39.7 39.0 39.8 42.4  MCV 95.3 95.1 93.9 94.9 95.2 94.0  PLT 187 186 223 218 265 308    Basic Metabolic Panel: Recent Labs  Lab 11/14/23 0300 11/15/23 0336 11/16/23 0400 11/17/23 0804 11/17/23 2009 11/18/23 1228 11/18/23 1653 11/19/23 0810 11/19/23 1659 11/20/23 0842  NA 136 132* 131* 135  --   --   --   --   --  135  K 4.4 4.2 4.4 4.3  --   --   --   --   --  5.5*  CL 106 103 101 102  --   --   --   --   --  100  CO2 21* 20* 20* 19*  --   --   --   --   --  26  GLUCOSE 112* 105* 81 122*  --   --   --    --   --  159*  BUN 23 18 16 20   --   --   --   --   --  32*  CREATININE 1.55* 1.54* 1.48* 1.41*  --   --   --   --   --  1.44*  CALCIUM 8.3* 8.5* 8.4* 8.5*  --   --   --   --   --  8.9  MG 2.2 1.9 1.7 1.7 1.7 1.7 1.7 1.8 1.7  --   PHOS 2.8 2.6 2.5 2.6 2.4* 2.3* 2.3* 2.9 2.4*  --     GFR: Estimated Creatinine Clearance: 52.3 mL/min (A) (by C-G formula based on SCr of 1.44 mg/dL (H)).  Liver Function Tests: Recent Labs  Lab 11/14/23 0300 11/15/23 0336 11/16/23 0400 11/17/23 0804 11/20/23 0842  AST 25 33 36 37 36  ALT 13 17 20 23 28   ALKPHOS 51 56 68 73 63  BILITOT 0.8 1.2 1.2 1.1 1.7*  PROT 5.7* 5.9* 5.8* 6.3* 6.4*  ALBUMIN 2.3* 2.3* 2.2* 2.4* 2.5*    Recent Labs  Lab 11/14/23 0300 11/15/23 0336 11/16/23 0400 11/17/23 0804  LIPASE 71* 62* 56* 54*    Coagulation Profile: Recent Labs  Lab 11/15/23 0336 11/16/23 0400 11/18/23 1228 11/19/23 0810 11/20/23 0313  INR 2.0* 1.9* 1.3* 1.2 1.2    CBG: Recent Labs  Lab 11/19/23 1730 11/19/23 2030 11/20/23 0011 11/20/23 0349 11/20/23 0805  GLUCAP 134* 159* 168* 143* 165*    Radiology Studies: No results found.      LOS: 12 days   Kylin Dubs [employer] on [email or web address]  11/20/2023, 10:48 AM

## 2023-11-20 NOTE — Progress Notes (Signed)
Mobility Specialist Progress Note:   11/20/23 1409  Mobility  Activity Transferred from chair to bed  Level of Assistance Standby assist, set-up cues, supervision of patient - no hands on  Assistive Device Front wheel walker  Distance Ambulated (ft) 3 ft  Activity Response Tolerated well  Mobility Referral Yes  Mobility visit 1 Mobility  Mobility Specialist Start Time (ACUTE ONLY) 1400  Mobility Specialist Stop Time (ACUTE ONLY) 1409  Mobility Specialist Time Calculation (min) (ACUTE ONLY) 9 min   Pt received in chair, requesting assistance back to bed. No complaints stated. VSS. Pt left in bed with call bell in reach and all needs met.   Leory Plowman  Mobility Specialist Please contact via [employer] office at [phone]

## 2023-11-21 ENCOUNTER — Inpatient Hospital Stay (HOSPITAL_COMMUNITY): Payer: Medicare Other

## 2023-11-21 ENCOUNTER — Other Ambulatory Visit: Payer: Self-pay | Admitting: General Surgery

## 2023-11-21 DIAGNOSIS — Z954 Presence of other heart-valve replacement: Secondary | ICD-10-CM | POA: Diagnosis not present

## 2023-11-21 DIAGNOSIS — K851 Biliary acute pancreatitis without necrosis or infection: Secondary | ICD-10-CM | POA: Diagnosis not present

## 2023-11-21 DIAGNOSIS — J9601 Acute respiratory failure with hypoxia: Secondary | ICD-10-CM | POA: Diagnosis not present

## 2023-11-21 DIAGNOSIS — I350 Nonrheumatic aortic (valve) stenosis: Secondary | ICD-10-CM | POA: Diagnosis not present

## 2023-11-21 DIAGNOSIS — Z952 Presence of prosthetic heart valve: Secondary | ICD-10-CM | POA: Diagnosis not present

## 2023-11-21 DIAGNOSIS — I2510 Atherosclerotic heart disease of native coronary artery without angina pectoris: Secondary | ICD-10-CM | POA: Diagnosis not present

## 2023-11-21 DIAGNOSIS — E875 Hyperkalemia: Secondary | ICD-10-CM | POA: Diagnosis not present

## 2023-11-21 LAB — CBC
HCT: 46.6 % (ref 39.0–52.0)
Hemoglobin: 15 g/dL (ref 13.0–17.0)
MCH: 30.9 pg (ref 26.0–34.0)
MCHC: 32.2 g/dL (ref 30.0–36.0)
MCV: 95.9 fL (ref 80.0–100.0)
Platelets: 310 10*3/uL (ref 150–400)
RBC: 4.86 MIL/uL (ref 4.22–5.81)
RDW: 15.3 % (ref 11.5–15.5)
WBC: 8.7 10*3/uL (ref 4.0–10.5)
nRBC: 0 % (ref 0.0–0.2)

## 2023-11-21 LAB — COMPREHENSIVE METABOLIC PANEL
ALT: 25 U/L (ref 0–44)
AST: 31 U/L (ref 15–41)
Albumin: 2.6 g/dL — ABNORMAL LOW (ref 3.5–5.0)
Alkaline Phosphatase: 62 U/L (ref 38–126)
Anion gap: 9 (ref 5–15)
BUN: 31 mg/dL — ABNORMAL HIGH (ref 8–23)
CO2: 22 mmol/L (ref 22–32)
Calcium: 8.5 mg/dL — ABNORMAL LOW (ref 8.9–10.3)
Chloride: 104 mmol/L (ref 98–111)
Creatinine, Ser: 1.15 mg/dL (ref 0.61–1.24)
GFR, Estimated: >60 mL/min (ref >60)
Glucose, Bld: 122 mg/dL — ABNORMAL HIGH (ref 70–99)
Potassium: 4.6 mmol/L (ref 3.5–5.1)
Sodium: 135 mmol/L (ref 135–145)
Total Bilirubin: 1.2 mg/dL (ref 0.0–1.2)
Total Protein: 6 g/dL — ABNORMAL LOW (ref 6.5–8.1)

## 2023-11-21 LAB — LIPASE, BLOOD: Lipase: 74 U/L — ABNORMAL HIGH (ref 11–51)

## 2023-11-21 LAB — GLUCOSE, CAPILLARY
Glucose-Capillary: 101 mg/dL — ABNORMAL HIGH (ref 70–99)
Glucose-Capillary: 117 mg/dL — ABNORMAL HIGH (ref 70–99)
Glucose-Capillary: 122 mg/dL — ABNORMAL HIGH (ref 70–99)
Glucose-Capillary: 143 mg/dL — ABNORMAL HIGH (ref 70–99)
Glucose-Capillary: 151 mg/dL — ABNORMAL HIGH (ref 70–99)

## 2023-11-21 LAB — PROTIME-INR
INR: 1.1 (ref 0.8–1.2)
Prothrombin Time: 14.1 s (ref 11.4–15.2)

## 2023-11-21 LAB — HEPARIN LEVEL (UNFRACTIONATED): Heparin Unfractionated: 0.46 [IU]/mL (ref 0.30–0.70)

## 2023-11-21 MED ORDER — HEPARIN (PORCINE) 25000 UT/250ML-% IV SOLN
2000.0000 [IU]/h | INTRAVENOUS | Status: DC
  Administered 2023-11-21: 2200 [IU]/h via INTRAVENOUS
  Administered 2023-11-22: 2100 [IU]/h via INTRAVENOUS
  Administered 2023-11-23: 2000 [IU]/h via INTRAVENOUS
  Administered 2023-11-23: 2050 [IU]/h via INTRAVENOUS
  Filled 2023-11-21 (×6): qty 250

## 2023-11-21 MED ORDER — WARFARIN - PHARMACIST DOSING INPATIENT
Freq: Every day | Status: DC
Start: 2023-11-21 — End: 2023-11-24

## 2023-11-21 MED ORDER — CILOSTAZOL 100 MG PO TABS
100.0000 mg | ORAL_TABLET | Freq: Two times a day (BID) | ORAL | Status: DC
  Administered 2023-11-21 – 2023-11-23 (×5): 100 mg
  Filled 2023-11-21 (×8): qty 1

## 2023-11-21 MED ORDER — CILOSTAZOL 100 MG PO TABS
100.0000 mg | ORAL_TABLET | Freq: Two times a day (BID) | ORAL | Status: DC
  Filled 2023-11-21: qty 1

## 2023-11-21 MED ORDER — WARFARIN SODIUM 2 MG PO TABS
2.0000 mg | ORAL_TABLET | Freq: Once | ORAL | Status: AC
  Administered 2023-11-21: 2 mg
  Filled 2023-11-21: qty 1

## 2023-11-21 MED ORDER — WARFARIN SODIUM 2 MG PO TABS: 2.0000 mg | ORAL_TABLET | Freq: Once | ORAL | Status: DC

## 2023-11-21 MED ORDER — IOHEXOL 350 MG/ML SOLN
75.0000 mL | Freq: Once | INTRAVENOUS | Status: AC | PRN
  Administered 2023-11-21: 75 mL via INTRAVENOUS

## 2023-11-21 NOTE — Progress Notes (Signed)
[facility] PROGRESS NOTE   Tony Zhang ZOX:[id number] DOB: 1946-09-25 DOA: 11/08/2023  PCP: System, Provider Not In  Brief History: 78 y.o. male with past medical history of hypertension, dyslipidemia, CAD, severe aortic stenosis, PVD, history of recurrent venous thrombosis, presented for transcatheter aortic valve replacement.  Following the procedure performed on 1/21 patient reported developing severe abdominal pain.  He was found to have pancreatitis.  Then he required transfer to ICU due to hypotension.  He was stabilized and then transferred back to the floor.  Cardiology and general surgery continues to follow.  There was also concern for a subclavian site hematoma which seems to be stable.  He was seen by cardiothoracic surgery as well.    Consultants: General surgery.  Cardiology.  Cardiothoracic surgery.   Subjective/Interval History: Patient mentions her abdominal pain is better.  Passing some gas from below.  No nausea or vomiting.  Denies any chest pain or shortness of breath.    Assessment/Plan:  Acute biliary pancreatitis CT scan showed acute pancreatitis.  Evidence for cholelithiasis.  Patient also underwent ultrasound.  Lipase level was initially in the 600s.  Improvement noted. Underwent MRI/MRCP which showed again cholelithiasis without cholecystitis.  No choledocholithiasis noted. General surgery is following.  Initially plan was for surgery in the future however cardiology thinks he will be low risk.  So they are considering operating during this hospital stay though he is not ready for surgery yet. Cortrak was placed due to poor oral intake.  Tube feedings initiated.  Seems to be tolerating well.   Further management per general surgery.  Timing of cholecystectomy is not clear yet.   Acute Respiratory Failure with Hypoxia Imaging studies showed emphysema with chronic lung changes.   Continue with nebulizer treatments and inhalers.   Continue with Flutter  Valve, Incentive Spirometry, and Guaifenesin 1200 mg po BID Does not use oxygen at home.   Continue to wean down oxygen to maintain saturations greater than 90%.  Currently on 3 L of oxygen via nasal cannula.   Transient Hypotension, improved In the setting of Hypovolemia after Furosemide and Metoprolol Administration as well as having poor Po Intake.  He is not on any blood pressure lowering agents currently.   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  Cardiology follows closely.  Acute Urinary Retention Continue with Foley catheter.  Voiding trial can be attempted once more ambulatory. -C/w Tamsulosin 0.8 mg po at bedtime for now   Hematuria -Has a Hx of Prostatomegaly and foley had been placed this Admission -Worsened in the setting of AC -This was discussed with Urology Dr. Berneice Heinrich who recommended Supportive Care  Hematuria appears to have improved off of anticoagulation.  Hemoglobin has been stable.  Severe Aortic Stenosis -Status post TAVR during this admission. Cardiology following and managing.  Off of anticoagulation due to subclavian site hematoma.   Previous RLL filling defect in bronchus -Possibly mucus plugging.    Subclavian site hematoma -Worsened in the setting of heparin drip Seen by thoracic surgery.  No intervention planned.   Anticoagulation has been discontinued for now.   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  Now off of anticoagulation due  to subclavian site hematoma and hematuria. No positive Doppler studies noted in our system.  His last Doppler was in 2020 which was negative for DVT.   Hypomagnesemia/hypophosphatemia/hyponatremia Continue  to monitor   AKI on CKD Stage 3b, improving and stable Metabolic Acidosis, mild Renal function has improved and stable for the most part.  Monitor urine output.  Avoid nephrotoxic agents.  Hyperkalemia Noted to be 5.5 on 2/2.  Resolved with Lokelma.   DVT Prophylaxis: Subcutaneous heparin Code Status: Full code Family Communication: Discussed with patient Disposition Plan: To be determined     Medications: Scheduled:  aspirin  81 mg Oral Daily   atorvastatin  20 mg Per Tube Daily   Chlorhexidine Gluconate Cloth  6 each Topical Daily   docusate  100 mg Per Tube BID   feeding supplement (PROSource TF20)  60 mL Per Tube Daily   fluticasone furoate-vilanterol  1 puff Inhalation Daily   gabapentin  400 mg Per Tube Q12H   ipratropium  0.5 mg Nebulization BID   levalbuterol  0.63 mg Nebulization BID   metoprolol tartrate  25 mg Per Tube BID   polyethylene glycol  17 g Per Tube Daily   sodium chloride flush  3 mL Intravenous Q12H   Continuous:  feeding supplement (OSMOLITE 1.5 CAL) Stopped (11/21/23 0016)   ZOX:WRUEAVWUJWJXB (TYLENOL) oral liquid 160 mg/5 mL, dextrose, guaiFENesin, hydrALAZINE, HYDROmorphone (DILAUDID) injection, ondansetron (ZOFRAN) IV, oxyCODONE, sodium chloride flush  Antibiotics: Anti-infectives (From admission, onward)    Start     Dose/Rate Route Frequency Ordered Stop   11/08/23 2100  ceFAZolin (ANCEF) IVPB 2g/100 mL premix        2 g 200 mL/hr over 30 Minutes Intravenous Every 8 hours 11/08/23 1647 11/09/23 0548   11/08/23 0400  ceFAZolin (ANCEF) IVPB 2g/100 mL premix  Status:  Discontinued        2 g 200 mL/hr over 30 Minutes Intravenous To Surgery 11/07/23 1323 11/08/23 1514       Objective:  Vital Signs  Vitals:   11/21/23 0408 11/21/23 0603 11/21/23 0730 11/21/23 0800  BP: (!) 154/72   (!) 134/42  Pulse: 70 84  82  Resp: 18 (!) 22  17  Temp: 98 F (36.7 C)   98.6 F (37 C)  TempSrc: Oral   Oral  SpO2: 97% 95% 98% 96%  Weight:  98  kg    Height:        Intake/Output Summary (Last 24 hours) at 11/21/2023 0919 Last data filed at 11/21/2023 0606 Gross per 24 hour  Intake --  Output 1550 ml  Net -1550 ml   Filed Weights   11/19/23 0409 11/20/23 0347 11/21/23 0603  Weight: 98.8 kg 98.7 kg 98 kg    General appearance: Awake alert.  In no distress Resp: Clear to auscultation bilaterally.  Normal effort Cardio: S1-S2 is normal regular.  No S3-S4.  No rubs murmurs or bruit GI: Abdomen is soft.  Still tender in the epigastric area but improved compared to before.  Bowel sounds present. Extremities: No edema.  Full range of motion of lower extremities. Neurologic: Alert and oriented x3.  No focal neurological deficits.     Lab Results:  Data Reviewed: I have personally reviewed following labs and reports of the imaging studies  CBC: Recent Labs  Lab 11/15/23 0336 11/15/23 1458 11/16/23 0400 11/17/23 0804 11/18/23 1228  WBC 7.5 7.6 7.2 8.6 9.8  NEUTROABS 6.0  --  5.8  --   --  HGB 12.9* 13.3 12.7* 12.9* 14.1  HCT 39.2 39.7 39.0 39.8 42.4  MCV 95.1 93.9 94.9 95.2 94.0  PLT 186 223 218 265 308    Basic Metabolic Panel: Recent Labs  Lab 11/15/23 0336 11/16/23 0400 11/17/23 0804 11/17/23 2009 11/18/23 1228 11/18/23 1653 11/19/23 0810 11/19/23 1659 11/20/23 0842 11/21/23 0330  NA 132* 131* 135  --   --   --   --   --  135 135  K 4.2 4.4 4.3  --   --   --   --   --  5.5* 4.6  CL 103 101 102  --   --   --   --   --  100 104  CO2 20* 20* 19*  --   --   --   --   --  26 22  GLUCOSE 105* 81 122*  --   --   --   --   --  159* 122*  BUN 18 16 20   --   --   --   --   --  32* 31*  CREATININE 1.54* 1.48* 1.41*  --   --   --   --   --  1.44* 1.15  CALCIUM 8.5* 8.4* 8.5*  --   --   --   --   --  8.9 8.5*  MG 1.9 1.7 1.7 1.7 1.7 1.7 1.8 1.7  --   --   PHOS 2.6 2.5 2.6 2.4* 2.3* 2.3* 2.9 2.4*  --   --     GFR: Estimated Creatinine Clearance: 65.2 mL/min (by C-G formula based on SCr of 1.15 mg/dL).  Liver  Function Tests: Recent Labs  Lab 11/15/23 0336 11/16/23 0400 11/17/23 0804 11/20/23 0842 11/21/23 0330  AST 33 36 37 36 31  ALT 17 20 23 28 25   ALKPHOS 56 68 73 63 62  BILITOT 1.2 1.2 1.1 1.7* 1.2  PROT 5.9* 5.8* 6.3* 6.4* 6.0*  ALBUMIN 2.3* 2.2* 2.4* 2.5* 2.6*    Recent Labs  Lab 11/15/23 0336 11/16/23 0400 11/17/23 0804  LIPASE 62* 56* 54*    Coagulation Profile: Recent Labs  Lab 11/16/23 0400 11/18/23 1228 11/19/23 0810 11/20/23 0313 11/21/23 0330  INR 1.9* 1.3* 1.2 1.2 1.1    CBG: Recent Labs  Lab 11/20/23 1219 11/20/23 1619 11/21/23 0023 11/21/23 0413 11/21/23 0828  GLUCAP 126* 155* 143* 117* 122*    Radiology Studies: No results found.      LOS: 13 days   Tony Zhang [employer] on [email or web address]  11/21/2023, 9:19 AM

## 2023-11-21 NOTE — Progress Notes (Addendum)
[facility]   MULTIDISCIPLINARY HEART VALVE TEAM  Patient Name: Tony Zhang Date of Encounter: 11/21/2023  Admit date: 11/08/2023  Primary Care Provider: System, Provider Not In 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 Still with abdominal pain but improved from previous.   Inpatient Medications    Scheduled Meds:  aspirin  81 mg Oral Daily   atorvastatin  20 mg Per Tube Daily   Chlorhexidine Gluconate Cloth  6 each Topical Daily   docusate  100 mg Per Tube BID   feeding supplement (PROSource TF20)  60 mL Per Tube Daily   fluticasone furoate-vilanterol  1 puff Inhalation Daily   gabapentin  400 mg Per Tube Q12H   ipratropium  0.5 mg Nebulization BID   levalbuterol  0.63 mg Nebulization BID   metoprolol tartrate  25 mg Per Tube BID   polyethylene glycol  17 g Per Tube Daily   sodium chloride flush  3 mL Intravenous Q12H   Continuous Infusions:  feeding supplement (OSMOLITE 1.5 CAL) Stopped (11/21/23 0016)   PRN Meds: acetaminophen (TYLENOL) oral liquid 160 mg/5 mL, dextrose, guaiFENesin, hydrALAZINE, HYDROmorphone (DILAUDID) injection, ondansetron (ZOFRAN) IV,  oxyCODONE, sodium chloride flush   Vital Signs    Vitals:   11/21/23 0025 11/21/23 0408 11/21/23 0603 11/21/23 0730  BP: 136/63 (!) 154/72    Pulse: 76 70 84   Resp: 17 18 (!) 22   Temp: 98.1 F (36.7 C) 98 F (36.7 C)    TempSrc: Oral Oral    SpO2: 96% 97% 95% 98%  Weight:   98 kg   Height:        Intake/Output Summary (Last 24 hours) at 11/21/2023 0825 Last data filed at 11/21/2023 0606 Gross per 24 hour  Intake --  Output 1550 ml  Net -1550 ml   Filed Weights   11/19/23 0409 11/20/23 0347 11/21/23 0603  Weight: 98.8 kg 98.7 kg 98 kg    Physical Exam    GEN: Well nourished, well developed, obese HEENT: Grossly normal.  Neck: Supple, no JVD, Cardiac: RRR, no murmurs, rubs, or gallops. No clubbing, cyanosis, edema.   Respiratory:  Respirations regular and unlabored, clear to auscultation bilaterally. GI: abdominal distension and with improvement in tenderness to palpation, cortrak in place MS: no deformity or atrophy. Skin: warm and dry, no rash. Groin/radial/subclvian site stable. Subclavian site with ecchymoses and swelling that is improved  Neuro:  Strength and sensation are intact. Psych: AAOx3.  Normal affect.  Labs    CBC Recent Labs    11/18/23 1228  WBC 9.8  HGB 14.1  HCT 42.4  MCV 94.0  PLT 308   Basic Metabolic Panel Recent  Labs    11/19/23 0810 11/19/23 1659 11/20/23 0842 11/21/23 0330  NA  --   --  135 135  K  --   --  5.5* 4.6  CL  --   --  100 104  CO2  --   --  26 22  GLUCOSE  --   --  159* 122*  BUN  --   --  32* 31*  CREATININE  --   --  1.44* 1.15  CALCIUM  --   --  8.9 8.5*  MG 1.8 1.7  --   --   PHOS 2.9 2.4*  --   --    Liver Function Tests Recent Labs    11/20/23 0842 11/21/23 0330  AST 36 31  ALT 28 25  ALKPHOS 63 62  BILITOT 1.7* 1.2  PROT 6.4* 6.0*  ALBUMIN 2.5* 2.6*   No results for input(s): "LIPASE", "AMYLASE" in the last 72 hours.   Telemetry    Sinus HR in 80s- Personally Reviewed  ECG    Sinus  with inferior anteroseptal q waves, 86 bpm. - Personally Reviewed  Patient Profile     Tony Zhang is a 78 y.o. 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 Pt developed acute abdominal pain after TAVR and found to have acute gallstone pancreatitis.   Assessment & Plan    Severe AS: s/p successful TAVR with a 26 mm Edwards Sapien 3 Ultra Resilia THV via the TF approach on 11/09/23. Post operative echo showed EF 65%, normally functioning TAVR with a mean gradient of 5 mmHg and no PVL. On aspirin 81 mg daily while OAC on hold  Subclavian seroma vs hematoma: no interventions plans, should self resolve. Heparin gtt reduced to Q8 hours at 5000 units.    Symptomatic hypotension: resolved with IV fluids. Likely related to acute pancreatitis.   HTN: BP with some elevated readings but okay on last check. Continue on Lopressor 25mg  BID per tube.  Sinus tach with short runs of SVT: HRs improved on Lopressor.   Acute on chronic HFpEF: as evidenced by an elevated LVEDP ~28 mm hg at the time of TAVR. Treated with one dose of IV lasix 40mg /KDur on 1/22. Then Lasix, Jardiance & Toprol-XL held given AKI and hypotension. Thankfully, has not had any s/s of acute CHF. BNP 255.   AKI with baseline CKD stage IIIb: baseline creat 1.5-2.4. Creat peaked at 3.8. Likely multifactorial with contrast exposure during TAVR, hypotension and poor PO intake with acute pancreatitis. Resolved with IV fluids. Creatinine down to 1.44 yesterday. Labs pending today.   Hyperkalemia: K 5.5 yesterday and treated with one dose of Lokelma. Labs pending today.    Acute gallstone pancreatitis: CT scan showed acute pancreatitis as well as gallstones. Appreciate input of IM team and Surgery team. s/p Cortrak placement.  Plan was for lap chole today but given ongoing pain surgery  plans to repeat a CT scan to ensure he is not developing pancreatitic necrosis or pseudocysts. He is cleared for surgery from a cardiac standpoint when appropriate.    Acute urinary retention: continue foley and Flomax   Constipation: continue colace/miralax.    Hyponatremia: resolved by labs yesterday.    CAD: s/p DES to LAD 2023. Pre op [facility] 09/28/23 showed severe calcified stenosis of the RCA unchanged from prior in  2023, patent mid LAD stent with moderate nonobstructive disease proximally, patent left circumflex, and patent subclavian with mild stenosis. Plan for medical management of CAD. Continue on atorvastatin 40 mg daily, Repatha 140mg  q 2weeks. No aspirin given chronic OAC.    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 Continue medical management with atorvastatin 40 mg daily, Repatha 140mg  q 2weeks. No aspirin given chronic OAC. Pletal on hold with potential surgery needed.  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    DVT/PE: Coumadin on hold awaiting surgery. IV heparin stopped due to wound hematoma. Continue heparin 5000U Q8 hours.  Previous RLL filling defect in bronchus: possibly mucus plugging. Repeat chest CT chest was reassuring that the previously seen endobronchial lesion was benign. No follow up CT required for this.  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   SignedCline Crock, PA-C  11/21/2023, 8:25 AM  Pager [phone]

## 2023-11-21 NOTE — Progress Notes (Signed)
Occupational Therapy Treatment Patient Details Name: Tony Zhang MRN: [id number] DOB: 1946-01-08 Today's Date: 11/21/2023   History of present illness 78 yo male adm 1/21 for TAVR. Acute course complicated by acute pancreatitis, gallstones, urinary retention and symptomatic hypotension. 1/28 Concern for hematoma at [facility]. PMHx: CAD, NSTEMI, HFrEF, PAD, DVT/PE, HTN, COPD, CKD, neuropathy, and gout.   OT comments  Pt currently at supervision level for selfcare tasks sit to stand and functional transfers with use of the RW.  Oxygen sats at 91% on room air at rest, decreasing to 89% with standing at the sink.  They increased up to 94% with supplemental O2 at 2Ls nasal cannula.  Pt has RW at home and used it occasionally.  Encouraged use of RW for safety and not to furniture walk, which he says he will do.  Pt would benefit from 24 hour supervision initially but he does not have that and will not agree to SNF level therapy.  Recommend continued acute care OT at this time with transition to [facility] at discharge.  Will continue to follow acutely.      If plan is discharge home, recommend the following:  A lot of help with bathing/dressing/bathroom;Assistance with cooking/housework;Assist for transportation;Help with stairs or ramp for entrance   Equipment Recommendations  None recommended by OT       Precautions / Restrictions Precautions Precautions: Fall;Other (comment) Precaution Comments: cortrak, watch sats Restrictions Weight Bearing Restrictions Per Provider Order: No       Mobility Bed Mobility Overal bed mobility: Modified Independent Bed Mobility: Supine to Sit     Supine to sit: HOB elevated     General bed mobility comments: HOB at 30 degrees and pt able to to transition without use of the rails    Transfers Overall transfer level: Needs assistance Equipment used: Rolling walker (2 wheels) Transfers: Sit to/from Stand Sit to Stand: Supervision                  Balance Overall balance assessment: Needs assistance Sitting-balance support: No upper extremity supported Sitting balance-Leahy Scale: Fair     Standing balance support: Bilateral upper extremity supported, Reliant on assistive device for balance, During functional activity Standing balance-Leahy Scale: Fair Standing balance comment: Pt needs UE support for mobility with use of the RW.                           ADL either performed or assessed with clinical judgement   ADL Overall ADL's : Needs assistance/impaired     Grooming: Wash/dry face;Brushing hair;Standing;Supervision/safety                   Toilet Transfer: Supervision/safety;Ambulation;Comfort height toilet;Rolling walker (2 wheels) Toilet Transfer Details (indicate cue type and reason): simulated Toileting- Clothing Manipulation and Hygiene: Supervision/safety;Sit to/from stand       Functional mobility during ADLs: Supervision/safety;Rolling walker (2 wheels) General ADL Comments: Pt with O2 sats at 94% at rest on 2Ls nasal cannula.  When removed, pt's sats at 90-91% at rest with a decrease to 89% standing at the sink.      Cognition Arousal: Alert Behavior During Therapy: WFL for tasks assessed/performed Overall Cognitive Status: Within Functional Limits for tasks assessed  Pertinent Vitals/ Pain       Pain Assessment Pain Assessment: Faces Faces Pain Scale: Hurts a little bit Pain Location: back Pain Descriptors / Indicators: Aching Pain Intervention(s): Limited activity within patient's tolerance, Repositioned         Frequency  Min 1X/week        Progress Toward Goals  OT Goals(current goals can now be found in the care plan section)  Progress towards OT goals: Goals met and updated - see care plan  Acute Rehab OT Goals Patient Stated Goal: Pt wants to go home OT Goal Formulation: With  patient Time For Goal Achievement: 12/05/23 Potential to Achieve Goals: Good  Plan         AM-PAC OT "6 Clicks" Daily Activity     Outcome Measure   Help from another person eating meals?: None Help from another person taking care of personal grooming?: A Little Help from another person toileting, which includes using toliet, bedpan, or urinal?: None Help from another person bathing (including washing, rinsing, drying)?: A Little Help from another person to put on and taking off regular upper body clothing?: None Help from another person to put on and taking off regular lower body clothing?: A Little 6 Click Score: 21    End of Session Equipment Utilized During Treatment: Gait belt;Rolling walker (2 wheels)  OT Visit Diagnosis: Unsteadiness on feet (R26.81);Muscle weakness (generalized) (M62.81)   Activity Tolerance Patient tolerated treatment well   Patient Left in bed;with call bell/phone within reach;with bed alarm set   Nurse Communication Mobility status;Other (comment) (Pt sitting EOB eating his dinner)        Time: 1610-9604 OT Time Calculation (min): 36 min  Charges: OT General Charges $OT Visit: 1 Visit OT Treatments $Self Care/Home Management : 23-37 mins   Perrin Maltese, OTR/L Acute Rehabilitation Services  Office [phone] 11/21/2023

## 2023-11-21 NOTE — Progress Notes (Signed)
13 Days Post-Op Procedure(s) (LRB): Transcatheter Aortic Valve Replacement-Subclavian (Left) TRANSESOPHAGEAL ECHOCARDIOGRAM (N/A) Subjective:  Feels ok overall. Starting oral diet. Upset that he has to wait a month to get his gallbladder out.  Objective: Vital signs in last 24 hours: Temp:  [98 F (36.7 C)-98.6 F (37 C)] 98.1 F (36.7 C) (02/03 1334) Pulse Rate:  [70-84] 70 (02/03 1334) Cardiac Rhythm: Normal sinus rhythm (02/03 0903) Resp:  [17-22] 18 (02/03 1334) BP: (134-175)/(42-88) 175/88 (02/03 1334) SpO2:  [88 %-98 %] 96 % (02/03 1334) Weight:  [98 kg] 98 kg (02/03 0603)  Hemodynamic parameters for last 24 hours:    Intake/Output from previous day: 02/02 0701 - 02/03 0700 In: -  Out: 1550 [Urine:1550] Intake/Output this shift: Total I/O In: -  Out: 350 [Urine:350]  General appearance: alert and cooperative Heart: regular rate and rhythm Lungs: clear to auscultation bilaterally Wound: subcutaneous hematoma/seroma looks a little smaller. It is soft with no sign of infection.   Lab Results: Recent Labs    11/21/23 0855  WBC 8.7  HGB 15.0  HCT 46.6  PLT 310   BMET:  Recent Labs    11/20/23 0842 11/21/23 0330  NA 135 135  K 5.5* 4.6  CL 100 104  CO2 26 22  GLUCOSE 159* 122*  BUN 32* 31*  CREATININE 1.44* 1.15  CALCIUM 8.9 8.5*    PT/INR:  Recent Labs    11/21/23 0330  LABPROT 14.1  INR 1.1   ABG    Component Value Date/Time   PHART 7.405 09/13/2020 1301   HCO3 24.1 09/28/2023 1118   HCO3 23.5 09/28/2023 1118   TCO2 24 11/08/2023 1436   ACIDBASEDEF 1.0 09/28/2023 1118   ACIDBASEDEF 2.0 09/28/2023 1118   O2SAT 65 09/28/2023 1118   O2SAT 64 09/28/2023 1118   CBG (last 3)  Recent Labs    11/21/23 0413 11/21/23 0828 11/21/23 1332  GLUCAP 117* 122* 101*    Assessment/Plan: S/P Procedure(s) (LRB): Transcatheter Aortic Valve Replacement-Subclavian (Left) TRANSESOPHAGEAL ECHOCARDIOGRAM (N/A)  His incision is healing well with  minimal discomfort and no sign of infection. The subcutaneous seroma/hematoma is stable and improving and I would leave this alone for now and let the incision and deeper tissues heal. Hopefully this will gradually resolve on its own.    LOS: 13 days    Alleen Borne 11/21/2023

## 2023-11-21 NOTE — Progress Notes (Addendum)
PHARMACY - ANTICOAGULATION CONSULT NOTE  Pharmacy Consult for Warfarin + IV heparin Indication: VTE history  Allergies  Allergen Reactions   Codeine Hives and Other (See Comments)    Takes benadryl to stop allergic reactions    Patient Measurements: Height: 6\' 1"  (185.4 cm) Weight: 98 kg (216 lb 1.6 oz) IBW/kg (Calculated) : 79.9 Heparin dosing weight = 100 kg  Vital Signs: Temp: 98.1 F (36.7 C) (02/03 1334) Temp Source: Oral (02/03 1334) BP: 175/88 (02/03 1334) Pulse Rate: 70 (02/03 1334)  Labs: Recent Labs    11/19/23 0810 11/20/23 0313 11/20/23 0842 11/21/23 0330 11/21/23 0855  HGB  --   --   --   --  15.0  HCT  --   --   --   --  46.6  PLT  --   --   --   --  310  LABPROT 15.1 14.9  --  14.1  --   INR 1.2 1.2  --  1.1  --   CREATININE  --   --  1.44* 1.15  --     Estimated Creatinine Clearance: 65.2 mL/min (by C-G formula based on SCr of 1.15 mg/dL).   Medical History: Past Medical History:  Diagnosis Date   AAA (abdominal aortic aneurysm) (HCC)    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     CAD (coronary artery disease)    COPD (chronic obstructive pulmonary disease) (HCC)    DJD (degenerative joint disease)    Dyslipidemia    Gout    Malignant hypertension    Peripheral vascular disease (HCC)    Pneumonia 08/18/2013   pt. 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 Excell Seltzer and Dr. Laneta Simmers   Severe aortic stenosis    Venous thrombosis    Recurrent    Assessment: 78 year old male s/p TAVR 1/21, on warfarin PTA for hx recurrent VTE.  Therapeutic anticoagulation held since 1/28 (last warfarin dose 1/26) with hematoma, hematuria and possible surgery.  Now no plans for surgery, pharmacy  consulted to restart warfarin with heparin bridge.   -PTA warfarin regimen - 4 mg Sat / Sun, 2 mg other days.  -Hgb 15, pltc 310, INR 1.1 -Hematoma stable -Of note, has not been therapeutic on heparin gtt this admission (HL = 0.27 on 2200 units/hr)  Goal of Therapy:  INR 2-3 Heparin level 0.3-0.7 units/ml, no bolus per cardiology Monitor platelets by anticoagulation protocol: Yes   Plan:  RESTART heparin infusion at 2200 units/hr, no bolus with recent hematoma STOP aspirin (started while anticoag on hold) START warfarin - give 2 mg x 1 8 hour heparin level Monitor daily heparin level, CBC, INR  Trixie Rude, PharmD Clinical Pharmacist 11/21/2023  1:54 PM

## 2023-11-21 NOTE — Progress Notes (Addendum)
Nutrition Follow-up  DOCUMENTATION CODES:   Not applicable  INTERVENTION:  Continue TF via Cortrak: Osmolite 1.5 @ 33mL/hr  Pro-Source TF20 60 mL daily     TF regimen to provide 2340kcal, 118g protein, free water  Advance diet, as tolerated given presence of BM  NUTRITION DIAGNOSIS:  Inadequate oral intake related to inability to eat as evidenced by NPO status.- remains applicable  GOAL:  Patient will meet greater than or equal to 90% of their needs - meeting/ongoing  MONITOR:  TF tolerance, Diet advancement, Labs  REASON FOR ASSESSMENT:  Consult Enteral/tube feeding initiation and management  ASSESSMENT:  Pt is 78 y.o. male w/ PMH: HTN, dyslipidemia, CAD, severe aortic stenosis, COPD, CKD,  PVD, hx of recurrent venous thrombosis. Presented for transcatheter aortic valve replacement. He developed pancreatitis s/p procedure.  1/21 TAVR procedure 1/24 ICU d/t progressive decline 1/26 back to progressive unit  1/29 cortrak placed (distal duodenem), trickles initiated 1/31 TF advanced 2/3 CT for pancreatitis complications  Patient continues on tube feeds r/t ileus. BM noted on 2/2. Feedings held last night for potential for cholecystectomy. Surgery now amicable to restarting at goal rate as procedure will not be completed today. Pt would prefer to have it done while admitted, however will not likely be feasible. Timeline to completion unclear.  Passing flatus. Continuing to move with PT. Endorses no N/V. Surgery amicable to initiating PO diet given BM on 2/2.  Admit Weight: 102.1kg Current Weight: 98kg  Foley catheter still in place. Had 1.5L UOP yesterday. Some generalized edema. Abdomen not as distended as last assessment. Crt trending down.    Intake/Output Summary (Last 24 hours) at 11/21/2023 1327 Last data filed at 11/21/2023 1200 Gross per 24 hour  Intake --  Output 1350 ml  Net -1350 ml    Net IO Since Admission: -3,997.85 mL [11/21/23 1327]   Labs:   Na+: 135 CBGs 122-159 over 48 hours A1c: 5.5 (02/2023) Crt 1.44>1.15 BUN 20>32>31 PHOS 2.3>2.9>2.4  Meds: gabapentin, docusate, Miralax  Diet Order:   Diet Order             Diet NPO time specified  Diet effective midnight            EDUCATION NEEDS:  No education needs have been identified at this time  Skin:  Skin Assessment: Reviewed RN Assessment  Last BM:  2/2  Height:  Ht Readings from Last 1 Encounters:  11/08/23 6\' 1"  (1.854 m)   Weight:  Wt Readings from Last 1 Encounters:  11/21/23 98 kg   Ideal Body Weight:  83.6 kg  BMI:  Body mass index is 28.51 kg/m.  Estimated Nutritional Needs:   Kcal:  2200-2400kcal  Protein:  110-120g  Fluid:  >2L/day  Myrtie Cruise MS, RD, LDN Registered Dietitian Clinical Nutrition RD Inpatient Contact Info in Amion

## 2023-11-21 NOTE — Progress Notes (Signed)
PHARMACY - ANTICOAGULATION CONSULT NOTE  Pharmacy Consult for Warfarin + IV heparin Indication: VTE history  Allergies  Allergen Reactions   Codeine Hives and Other (See Comments)    Takes benadryl to stop allergic reactions    Patient Measurements: Height: 6\' 1"  (185.4 cm) Weight: 98 kg (216 lb 1.6 oz) IBW/kg (Calculated) : 79.9 Heparin dosing weight = 100 kg  Vital Signs: Temp: 97.8 F (36.6 C) (02/03 1950) Temp Source: Oral (02/03 1950) BP: 170/83 (02/03 1950) Pulse Rate: 84 (02/03 2013)  Labs: Recent Labs    11/19/23 0810 11/20/23 0313 11/20/23 0842 11/21/23 0330 11/21/23 0855 11/21/23 2134  HGB  --   --   --   --  15.0  --   HCT  --   --   --   --  46.6  --   PLT  --   --   --   --  310  --   LABPROT 15.1 14.9  --  14.1  --   --   INR 1.2 1.2  --  1.1  --   --   HEPARINUNFRC  --   --   --   --   --  0.46  CREATININE  --   --  1.44* 1.15  --   --     Estimated Creatinine Clearance: 65.2 mL/min (by C-G formula based on SCr of 1.15 mg/dL).   Medical History: Past Medical History:  Diagnosis Date   AAA (abdominal aortic aneurysm) (HCC)    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     CAD (coronary artery disease)    COPD (chronic obstructive pulmonary disease) (HCC)    DJD (degenerative joint disease)    Dyslipidemia    Gout    Malignant hypertension    Peripheral vascular disease (HCC)    Pneumonia 08/18/2013   pt. 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 Excell Seltzer and Dr. Laneta Simmers   Severe aortic stenosis    Venous thrombosis    Recurrent    Assessment: 78 year old male s/p TAVR 1/21, on warfarin PTA for hx recurrent VTE.  Therapeutic anticoagulation held since 1/28 (last warfarin dose  1/26) with hematoma, hematuria and possible surgery.  Now no plans for surgery, pharmacy consulted to restart warfarin with heparin bridge.   -PTA warfarin regimen - 4 mg Sat / Sun, 2 mg other days.  -Hgb 15, pltc 310, INR 1.1 -Hematoma stable -Of note, has not been therapeutic on heparin gtt this admission (HL = 0.27 on 2200 units/hr)  PM: heparin level = 0.46 (therapeutic) on heparin 2200 units/hr. No issues with the infusion or overt bleeding reported.  Goal of Therapy:  INR 2-3 Heparin level 0.3-0.7 units/ml, no bolus per cardiology Monitor platelets by anticoagulation protocol: Yes   Plan:  Continue heparin infusion at 2200 units/hr Check confirmatory heparin level w/ AM labs Monitor daily heparin level, CBC, INR  Loralee Pacas, PharmD, BCPS Clinical Pharmacist 11/21/2023  10:05 PM

## 2023-11-21 NOTE — Progress Notes (Signed)
CT reviewed.   Mild-to-moderate acute pancreatitis, without significant change. No evidence of pancreatic necrosis or pseudocyst. Cholelithiasis. No radiographic evidence of cholecystitis or biliary ductal dilatation.  Reviewed with attending. No plans for surgery during this admission. He does not have evidence of Cholecystitis. Will plan to do a delayed surgery given ongoing pancreatitis and in setting of recent TAVR.   He is okay to eat (would start with CLD and adv based on tolerance) from our standpoint. Could consider GI consult if he has any worsening pain. He is okay to have anticoagulation restarted.   We will schedule him for OR the week of 3/4 with Dr. Dwain Sarna.   Cardiology will plan to see in the office beforehand for f/u and repeat echo. Asked for them to clarify when blood thinners can be held pre-op.   I discussed above with him. He is in agreement with the plan.   We will see how he tolerates diet advancement.   Jacinto Halim, PA-C

## 2023-11-21 NOTE — Progress Notes (Signed)
Mobility Specialist Progress Note:   11/21/23 1400  Mobility  Activity Ambulated with assistance in hallway  Level of Assistance Standby assist, set-up cues, supervision of patient - no hands on  Assistive Device Front wheel walker  Distance Ambulated (ft) 200 ft  Activity Response Tolerated well  Mobility Referral Yes  Mobility visit 1 Mobility  Mobility Specialist Start Time (ACUTE ONLY) 1400  Mobility Specialist Stop Time (ACUTE ONLY) 1415  Mobility Specialist Time Calculation (min) (ACUTE ONLY) 15 min   Pt received in bed, agreeable to mobility session. Ambulated in hallway with RW. Tolerated well, took 1 standing rest break d/t fatigue. SpO2 89-95% on 2L throughout session. Returned pt to room, lying comfortably in bed. Alarm on, call bell in reach, all needs met.   [name] Mobility Specialist Please contact via Special educational needs teacher or  Rehab office at [phone]

## 2023-11-21 NOTE — Progress Notes (Addendum)
Physical Therapy Treatment Patient Details Name: Tony Zhang MRN: [id number] DOB: 24-Jan-1946 Today's Date: 11/21/2023   History of Present Illness 78 yo male adm 1/21 for TAVR. Acute course complicated by acute pancreatitis, gallstones, urinary retention and symptomatic hypotension. 1/28 Concern for hematoma at [facility]. PMHx: CAD, NSTEMI, HFrEF, PAD, DVT/PE, HTN, COPD, CKD, neuropathy, and gout.    PT Comments  Pt tolerating progressive gait and concerned over wanting to eat and have cortrak out. Pt states he has no desire for SNF and plans to return home alone. Patient will benefit from continued inpatient follow up therapy, <3 hours/day but if he refuses HHPT recommended. Pt with increased ability with transfers and gait this session. Will continue to follow to maximize transfers and gait.   SPO2 88% on RA, 97% on 2L at rest, unable to get accurate pleth during gait HR 88-103    If plan is discharge home, recommend the following: A little help with walking and/or transfers;Assist for transportation;Assistance with cooking/housework;Help with stairs or ramp for entrance;A little help with bathing/dressing/bathroom   Can travel by private vehicle     Yes  Equipment Recommendations  None recommended by PT    Recommendations for Other Services       Precautions / Restrictions Precautions Precautions: Fall;Other (comment) Precaution Comments: cortrak, watch sats     Mobility  Bed Mobility Overal bed mobility: Modified Independent Bed Mobility: Supine to Sit, Sit to Supine           General bed mobility comments: pt able to transition supine<>sit without assist with HOB 15 degrees    Transfers Overall transfer level: Needs assistance   Transfers: Sit to/from Stand Sit to Stand: Supervision           General transfer comment: cues for safety and controlled descent. pt able to stand from bed x 2 and performed 5 repeated STS from lower recliner with cues for hand placement  and controlled descent    Ambulation/Gait Ambulation/Gait assistance: Contact guard assist Gait Distance (Feet): 130 Feet Assistive device: Rolling walker (2 wheels) Gait Pattern/deviations: Step-through pattern, Decreased step length - right, Decreased step length - left   Gait velocity interpretation: <1.8 ft/sec, indicate of risk for recurrent falls   General Gait Details: cues for posture and proximity to RW. 2 standing rest during gait with encouragement to maximize distance, limited by back pain   Stairs             Wheelchair Mobility     Tilt Bed    Modified Rankin (Stroke Patients Only)       Balance Overall balance assessment: Needs assistance Sitting-balance support: No upper extremity supported Sitting balance-Leahy Scale: Fair     Standing balance support: Bilateral upper extremity supported, Reliant on assistive device for balance, During functional activity Standing balance-Leahy Scale: Poor Standing balance comment: RW for gait                            Cognition Arousal: Alert Behavior During Therapy: WFL for tasks assessed/performed Overall Cognitive Status: Within Functional Limits for tasks assessed                                          Exercises      General Comments        Pertinent Vitals/Pain Pain Assessment Pain Score: 5  Pain Location: back Pain Descriptors / Indicators: Aching Pain Intervention(s): Limited activity within patient's tolerance, Monitored during session, Repositioned    Home Living                          Prior Function            PT Goals (current goals can now be found in the care plan section) Progress towards PT goals: Progressing toward goals    Frequency    Min 1X/week      PT Plan      Co-evaluation              AM-PAC PT "6 Clicks" Mobility   Outcome Measure  Help needed turning from your back to your side while in a flat bed  without using bedrails?: A Little Help needed moving from lying on your back to sitting on the side of a flat bed without using bedrails?: A Little Help needed moving to and from a bed to a chair (including a wheelchair)?: A Little Help needed standing up from a chair using your arms (e.g., wheelchair or bedside chair)?: A Little Help needed to walk in hospital room?: A Little Help needed climbing 3-5 steps with a railing? : A Lot 6 Click Score: 17    End of Session Equipment Utilized During Treatment: Oxygen Activity Tolerance: Patient tolerated treatment well Patient left: in chair;with call bell/phone within reach Nurse Communication: Mobility status PT Visit Diagnosis: Other abnormalities of gait and mobility (R26.89);Unsteadiness on feet (R26.81)     Time: 6578-4696 PT Time Calculation (min) (ACUTE ONLY): 27 min  Charges:    $Gait Training: 8-22 mins $Therapeutic Activity: 8-22 mins PT General Charges $$ ACUTE PT VISIT: 1 Visit                     Merryl Hacker, PT Acute Rehabilitation Services Office: [phone]    Enedina Finner Cloey Sferrazza 11/21/2023, 11:55 AM

## 2023-11-21 NOTE — Progress Notes (Addendum)
13 Days Post-Op  Subjective: CC: Having some epigastric pain today. Required 3 doses of pain medication yesterday. No n/v.   Afebrile. No tachycardia or hypotension. WBC wnl on last check 1/31. Last Lipase check 1/30 and 54. LFT's wnl .  Objective: Vital signs in last 24 hours: Temp:  [98 F (36.7 C)-98.3 F (36.8 C)] 98 F (36.7 C) (02/03 0408) Pulse Rate:  [70-86] 84 (02/03 0603) Resp:  [16-22] 22 (02/03 0603) BP: (125-170)/(54-78) 154/72 (02/03 0408) SpO2:  [95 %-99 %] 98 % (02/03 0730) Weight:  [98 kg] 98 kg (02/03 0603) Last BM Date : 11/20/23  Intake/Output from previous day: 02/02 0701 - 02/03 0700 In: -  Out: 1550 [Urine:1550] Intake/Output this shift: No intake/output data recorded.  PE: Gen:  Alert, NAD, pleasant Abd: Soft, ND, epigastric ttp with grimacing when palpating.   Lab Results:  Recent Labs    11/18/23 1228  WBC 9.8  HGB 14.1  HCT 42.4  PLT 308   BMET Recent Labs    11/20/23 0842 11/21/23 0330  NA 135 135  K 5.5* 4.6  CL 100 104  CO2 26 22  GLUCOSE 159* 122*  BUN 32* 31*  CREATININE 1.44* 1.15  CALCIUM 8.9 8.5*   PT/INR Recent Labs    11/20/23 0313 11/21/23 0330  LABPROT 14.9 14.1  INR 1.2 1.1   CMP     Component Value Date/Time   NA 135 11/21/2023 0330   NA 140 10/05/2023 1010   K 4.6 11/21/2023 0330   CL 104 11/21/2023 0330   CO2 22 11/21/2023 0330   GLUCOSE 122 (H) 11/21/2023 0330   BUN 31 (H) 11/21/2023 0330   BUN 24 10/05/2023 1010   CREATININE 1.15 11/21/2023 0330   CREATININE 1.56 (H) 09/17/2013 1135   CALCIUM 8.5 (L) 11/21/2023 0330   PROT 6.0 (L) 11/21/2023 0330   PROT 7.2 09/22/2023 0947   ALBUMIN 2.6 (L) 11/21/2023 0330   ALBUMIN 4.4 09/22/2023 0947   AST 31 11/21/2023 0330   ALT 25 11/21/2023 0330   ALKPHOS 62 11/21/2023 0330   BILITOT 1.2 11/21/2023 0330   BILITOT 0.7 09/22/2023 0947   GFRNONAA >60 11/21/2023 0330   GFRAA >60 08/14/2019 0429   Lipase     Component Value Date/Time   LIPASE  54 (H) 11/17/2023 0804    Studies/Results: No results found.  Anti-infectives: Anti-infectives (From admission, onward)    Start     Dose/Rate Route Frequency Ordered Stop   11/08/23 2100  ceFAZolin (ANCEF) IVPB 2g/100 mL premix        2 g 200 mL/hr over 30 Minutes Intravenous Every 8 hours 11/08/23 1647 11/09/23 0548   11/08/23 0400  ceFAZolin (ANCEF) IVPB 2g/100 mL premix  Status:  Discontinued        2 g 200 mL/hr over 30 Minutes Intravenous To Surgery 11/07/23 1323 11/08/23 1514        Assessment/Plan Tony Zhang is an 78 y.o. male status post TAVR on 1/21 with subsequent development of abdominal pain and workup concerning for acute pancreatitis secondary to gallstones    - Previously discussed cardiac risk with primary team, cardiology, given recent TAVR. Although ideally the timeframe is 1 month post TAVR for elective surgery, given the patient is quite symptomatic, cardiology states low enough risk that can consider surgery during this hospitalization.    - No plans for surgery today. Given ongoing pain will repeat a CT scan today to ensure he is not developing  pancreatitic necrosis or pseudocysts. Will also repeat full set of labs.   FEN - NPO. TF's on hold - okay to restart PP TF's VTE - SCDs, okay to restart heparin gtt ID - None from our standpoint. Patient had no evidence of cholecystitis on MRCP 1/24 and WBC wnl.    LOS: 13 days    Jacinto Halim , [facility] 11/21/2023, 7:53 AM Please see Amion for pager number during day hours 7:00am-4:30pm

## 2023-11-22 DIAGNOSIS — E875 Hyperkalemia: Secondary | ICD-10-CM | POA: Diagnosis not present

## 2023-11-22 DIAGNOSIS — J9601 Acute respiratory failure with hypoxia: Secondary | ICD-10-CM | POA: Diagnosis not present

## 2023-11-22 DIAGNOSIS — K851 Biliary acute pancreatitis without necrosis or infection: Secondary | ICD-10-CM | POA: Diagnosis not present

## 2023-11-22 DIAGNOSIS — I350 Nonrheumatic aortic (valve) stenosis: Secondary | ICD-10-CM | POA: Diagnosis not present

## 2023-11-22 LAB — BASIC METABOLIC PANEL
Anion gap: 10 (ref 5–15)
BUN: 33 mg/dL — ABNORMAL HIGH (ref 8–23)
CO2: 24 mmol/L (ref 22–32)
Calcium: 8.6 mg/dL — ABNORMAL LOW (ref 8.9–10.3)
Chloride: 99 mmol/L (ref 98–111)
Creatinine, Ser: 1.24 mg/dL (ref 0.61–1.24)
GFR, Estimated: 60 mL/min — ABNORMAL LOW (ref >60)
Glucose, Bld: 174 mg/dL — ABNORMAL HIGH (ref 70–99)
Potassium: 4.9 mmol/L (ref 3.5–5.1)
Sodium: 133 mmol/L — ABNORMAL LOW (ref 135–145)

## 2023-11-22 LAB — GLUCOSE, CAPILLARY
Glucose-Capillary: 184 mg/dL — ABNORMAL HIGH (ref 70–99)
Glucose-Capillary: 190 mg/dL — ABNORMAL HIGH (ref 70–99)
Glucose-Capillary: 171 mg/dL — ABNORMAL HIGH (ref 70–99)

## 2023-11-22 LAB — CBC
HCT: 42.7 % (ref 39.0–52.0)
Hemoglobin: 13.8 g/dL (ref 13.0–17.0)
MCH: 30.7 pg (ref 26.0–34.0)
MCHC: 32.3 g/dL (ref 30.0–36.0)
MCV: 94.9 fL (ref 80.0–100.0)
Platelets: 269 10*3/uL (ref 150–400)
RBC: 4.5 MIL/uL (ref 4.22–5.81)
RDW: 15.4 % (ref 11.5–15.5)
WBC: 10.8 10*3/uL — ABNORMAL HIGH (ref 4.0–10.5)
nRBC: 0 % (ref 0.0–0.2)

## 2023-11-22 LAB — PROTIME-INR
INR: 1 (ref 0.8–1.2)
Prothrombin Time: 13.6 s (ref 11.4–15.2)

## 2023-11-22 LAB — HEPARIN LEVEL (UNFRACTIONATED)
Heparin Unfractionated: 0.8 [IU]/mL — ABNORMAL HIGH (ref 0.30–0.70)
Heparin Unfractionated: 0.63 [IU]/mL (ref 0.30–0.70)

## 2023-11-22 MED ORDER — WARFARIN SODIUM 2 MG PO TABS
2.0000 mg | ORAL_TABLET | Freq: Once | ORAL | Status: AC
  Administered 2023-11-22: 2 mg via ORAL
  Filled 2023-11-22: qty 1

## 2023-11-22 MED ORDER — METOPROLOL SUCCINATE ER 100 MG PO TB24
100.0000 mg | ORAL_TABLET | Freq: Every day | ORAL | Status: DC
  Administered 2023-11-22 – 2023-11-24 (×3): 100 mg via ORAL
  Filled 2023-11-22 (×3): qty 1

## 2023-11-22 NOTE — Progress Notes (Signed)
 PHARMACY - ANTICOAGULATION CONSULT NOTE  Pharmacy Consult:  IV heparin  Indication: VTE history  Allergies  Allergen Reactions   Codeine Hives and Other (See Comments)    Takes benadryl  to stop allergic reactions    Patient Measurements: Height: 6' 1 (185.4 cm) Weight: 97.4 kg (214 lb 11.2 oz) IBW/kg (Calculated) : 79.9 Heparin  dosing weight = 100 kg  Vital Signs: Temp: 98.1 F (36.7 C) (02/04 1606) Temp Source: Oral (02/04 1606) BP: 146/69 (02/04 1657) Pulse Rate: 81 (02/04 1606)  Labs: Recent Labs    11/20/23 0313 11/20/23 9157 11/21/23 0330 11/21/23 0855 11/21/23 2134 11/22/23 0655 11/22/23 1647  HGB  --   --   --  15.0  --  13.8  --   HCT  --   --   --  46.6  --  42.7  --   PLT  --   --   --  310  --  269  --   LABPROT 14.9  --  14.1  --   --  13.6  --   INR 1.2  --  1.1  --   --  1.0  --   HEPARINUNFRC  --   --   --   --  0.46 0.80* 0.63  CREATININE  --  1.44* 1.15  --   --  1.24  --     Estimated Creatinine Clearance: 60.3 mL/min (by C-G formula based on SCr of 1.24 mg/dL).   Assessment: 78 year old male s/p TAVR 1/21, on warfarin PTA for hx recurrent VTE.  Therapeutic anticoagulation held since 1/28 (last warfarin dose 1/26) with hematoma, hematuria and possible surgery.  Now no plans for surgery, pharmacy consulted to restart warfarin with heparin  bridge.   -PTA warfarin regimen - 4 mg Sat / Sun, 2 mg all other days.  -Heparin  level therapeutic at 0.63 units/mL -Hgb 13.6, pltc 269, INR 1.0 -Hematoma stable / improving per RN -Tolerating clear liquid diet, ate some chicken noodle soup and applesauce yesterday  Goal of Therapy:  INR 2-3 Heparin  level 0.3-0.7 units/ml, no bolus Monitor platelets by anticoagulation protocol: Yes   Plan:  Decrease heparin  infusion slightly to 2050 units/hr Give warfarin 2 mg x 1 as already ordered Monitor daily heparin  level, CBC, INR  Hailea Eaglin D. Lendell, PharmD, BCPS, BCCCP 11/22/2023, 5:26 PM

## 2023-11-22 NOTE — Progress Notes (Addendum)
 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 Still with abdominal pain but improved from previous 3/10 down from 9/10. Had a BM  Inpatient Medications    Scheduled Meds:  atorvastatin   20 mg Per Tube Daily   Chlorhexidine  Gluconate Cloth  6 each Topical Daily   cilostazol   100 mg Per Tube BID   docusate  100 mg Per Tube BID   feeding supplement (PROSource TF20)  60 mL Per Tube Daily   fluticasone  furoate-vilanterol  1 puff Inhalation Daily   gabapentin   400 mg Per Tube Q12H   ipratropium  0.5 mg Nebulization BID   levalbuterol   0.63 mg Nebulization BID   metoprolol  tartrate  25 mg Per Tube BID   polyethylene glycol  17 g Per Tube Daily   sodium chloride  flush  3 mL Intravenous Q12H   Warfarin - Pharmacist Dosing Inpatient   Does not apply q1600   Continuous Infusions:  feeding supplement (OSMOLITE 1.5 CAL) Stopped (11/21/23 0016)   heparin  2,200 Units/hr (11/21/23 1547)   PRN Meds: acetaminophen   (TYLENOL ) oral liquid 160 mg/5 mL, dextrose , guaiFENesin , hydrALAZINE , HYDROmorphone  (DILAUDID ) injection, ondansetron  (ZOFRAN ) IV, oxyCODONE , sodium chloride  flush   Vital Signs    Vitals:   11/21/23 2013 11/21/23 2327 11/22/23 0358 11/22/23 0432  BP:  (!) 165/60 (!) 154/88   Pulse: 84 79 86   Resp: (!) 25 19 20 19   Temp:  97.6 F (36.4 C) 97.8 F (36.6 C)   TempSrc:  Oral Oral   SpO2: 97% 98% 97%   Weight:    97.4 kg  Height:        Intake/Output Summary (Last 24 hours) at 11/22/2023 0801 Last data filed at 11/22/2023 0430 Gross per 24 hour  Intake 1601.09 ml  Output 1500 ml  Net 101.09 ml   Filed Weights   11/20/23 0347 11/21/23 0603 11/22/23 0432  Weight: 98.7 kg 98 kg 97.4 kg    Physical Exam    GEN: Well nourished, well developed, obese HEENT: Grossly normal.  Neck: Supple, no JVD, Cardiac: RRR, no murmurs, rubs, or gallops. No clubbing, cyanosis, edema.   Respiratory:  Respirations regular and unlabored, clear to auscultation bilaterally. GI: improvement in tenderness to palpation, cortrak in place MS: no deformity or atrophy. Skin: warm and dry, no rash. Groin/radial/subclvian site stable. Subclavian site with ecchymoses and swelling that is improved  Neuro:  Strength and sensation are intact. Psych: AAOx3.  Normal affect.  Labs    CBC Recent Labs  11/21/23 0855 11/22/23 0655  WBC 8.7 10.8*  HGB 15.0 13.8  HCT 46.6 42.7  MCV 95.9 94.9  PLT 310 269   Basic Metabolic Panel Recent Labs    97/98/74 0810 11/19/23 1659 11/20/23 0842 11/21/23 0330 11/22/23 0655  NA  --   --    < > 135 133*  K  --   --    < > 4.6 4.9  CL  --   --    < > 104 99  CO2  --   --    < > 22 24  GLUCOSE  --   --    < > 122* 174*  BUN  --   --    < > 31* 33*  CREATININE  --   --    < > 1.15 1.24  CALCIUM   --   --    < > 8.5* 8.6*  MG 1.8 1.7  --   --   --   PHOS 2.9 2.4*  --   --   --    < > = values in this interval not displayed.   Liver Function Tests Recent Labs     11/20/23 0842 11/21/23 0330  AST 36 31  ALT 28 25  ALKPHOS 63 62  BILITOT 1.7* 1.2  PROT 6.4* 6.0*  ALBUMIN 2.5* 2.6*   Recent Labs    11/21/23 0855  LIPASE 74*     Telemetry    Sinus HR in 80s, 4 sec run of SVT- Personally Reviewed  ECG    Sinus with inferior anteroseptal q waves, 86 bpm. - Personally Reviewed  Patient Profile     MERRICK MAGGIO is a 78 y.o. 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 Pt developed acute abdominal pain after TAVR and found to have acute gallstone pancreatitis.   Assessment & Plan    Severe AS: s/p successful TAVR with a 26 mm Edwards Sapien 3 Ultra Resilia THV via the TF approach on 11/09/23. Post operative echo showed EF 65%, normally functioning TAVR with a mean gradient of 5 mmHg and no PVL. Back on Coumadin  with heparin  bridge  Subclavian seroma vs hematoma: no interventions plans, will hopefully self resolve. Seen by Dr. Lucas yesterday.    Symptomatic hypotension: resolved with IV fluids. Likely related to acute pancreatitis.   HTN: BP becoming more elevated. Resume home Toprol  XL 100 mg daily now that is taking orals.   Sinus tach with short runs of SVT: HRs improved on Lopressor . Switch back to Toprol  XL 100 mg since he can take oral meds now.  Acute on chronic HFpEF: as evidenced by an elevated LVEDP ~28 mm hg at the time of TAVR. Treated with one dose of IV lasix  40mg /KDur on 1/22. Then Lasix , Jardiance  & Toprol -XL held given AKI and hypotension. Thankfully, has not had any s/s of acute CHF. BNP 255. Will hold off on Lasix  for now given limited oral intake.  AKI with baseline CKD stage IIIb: baseline creat 1.5-2.4. Creat peaked at 3.8. Likely multifactorial with contrast exposure during TAVR, hypotension and poor PO intake with acute pancreatitis. Resolved with IV  fluids. Creatinine down to 1.24 today  Hyperkalemia: K 5.5. resolved after 1 dose of Lokelma .    Acute gallstone pancreatitis: CT scan showed acute pancreatitis as well as gallstones. s/p Cortrak  placement. No plans for surgery this admission. Scheduled for lap-chole on 3/4 with Dr. Ebbie. Escalating diet to clear liquids. Appreciate surgery and hospital medicine assistance.    Acute urinary retention: continue foley and Flomax . Would consider a voiding trial.   Constipation: continue colace/miralax . Finally had a BM    Hyponatremia: NA 133 today.     CAD: s/p DES to LAD 2023. Pre op [facility] 09/28/23 showed severe calcified stenosis of the RCA unchanged from prior in 2023, patent mid LAD stent with moderate nonobstructive disease proximally, patent left circumflex, and patent subclavian with mild stenosis. Plan for medical management of CAD. Continue on atorvastatin  40 mg daily, Repatha  140mg  q 2weeks. No aspirin  given chronic OAC.    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 Continue medical management with atorvastatin  40 mg daily, Pletal  100mg  BID, Repatha  140mg  q 2weeks. No aspirin  given chronic OAC.   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    DVT/PE: started back on Coumadin  with heparin  bridge on 2/3  Previous RLL filling defect in bronchus: possibly mucus plugging. Repeat chest CT chest was reassuring that the previously seen endobronchial lesion was benign. No follow up CT required for this.  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   SignedLamarr Hummer, PA-C  11/22/2023, 8:01 AM  Pager [phone]  Patient seen, examined. Available data reviewed. Agree with findings, assessment, and plan as outlined by Izetta Hummer, PA-C.  The patient is independently interviewed and examined.  He is alert,  oriented, in no distress.  HEENT is normal, lungs are clear bilaterally, heart is regular rate and rhythm with no murmur gallop, chest exam shows a soft swollen area around the left subclavian incision with no tenderness or erythema, abdomen is soft and nontender today, extremities have no edema.  The patient is clinically stable from a cardiovascular perspective.  I appreciate the surgical and medical teams care of him for treatment of his acute gallstone pancreatitis.  He is trying to advance his diet today.  He is on warfarin with a heparin  bridge which was just started yesterday.  Otherwise, as outlined above.  Ozell Fell, M.D. 11/22/2023 4:01 PM

## 2023-11-22 NOTE — Progress Notes (Signed)
Patient has been tolerating full liquid diet without worsening abd pain, pain meds given for moderate pain, Heparin infusion continue, no any signs of bleeding noted.

## 2023-11-22 NOTE — Progress Notes (Signed)
 PHARMACY - ANTICOAGULATION CONSULT NOTE  Pharmacy Consult for Warfarin + IV heparin  Indication: VTE history  Allergies  Allergen Reactions   Codeine Hives and Other (See Comments)    Takes benadryl  to stop allergic reactions    Patient Measurements: Height: 6' 1 (185.4 cm) Weight: 97.4 kg (214 lb 11.2 oz) IBW/kg (Calculated) : 79.9 Heparin  dosing weight = 100 kg  Vital Signs: Temp: 97.5 F (36.4 C) (02/04 0808) Temp Source: Oral (02/04 0808) BP: 140/42 (02/04 0850) Pulse Rate: 83 (02/04 0850)  Labs: Recent Labs    11/20/23 0313 11/20/23 9157 11/21/23 0330 11/21/23 0855 11/21/23 2134 11/22/23 0655  HGB  --   --   --  15.0  --  13.8  HCT  --   --   --  46.6  --  42.7  PLT  --   --   --  310  --  269  LABPROT 14.9  --  14.1  --   --  13.6  INR 1.2  --  1.1  --   --  1.0  HEPARINUNFRC  --   --   --   --  0.46 0.80*  CREATININE  --  1.44* 1.15  --   --  1.24    Estimated Creatinine Clearance: 60.3 mL/min (by C-G formula based on SCr of 1.24 mg/dL).   Medical History: Past Medical History:  Diagnosis Date   AAA (abdominal aortic aneurysm) (HCC)    Blood dyscrasia    followed by Dr. Cloretta, for clotting issue, has been on Coumadin  for about 20 yrs.     CAD (coronary artery disease)    COPD (chronic obstructive pulmonary disease) (HCC)    DJD (degenerative joint disease)    Dyslipidemia    Gout    Malignant hypertension    Peripheral vascular disease (HCC)    Pneumonia 08/18/2013   pt. 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 Wonda and Dr. Lucas   Severe aortic stenosis    Venous thrombosis    Recurrent    Assessment: 78 year old male s/p TAVR 1/21, on warfarin PTA for hx recurrent VTE.  Therapeutic anticoagulation held since 1/28 (last warfarin dose  1/26) with hematoma, hematuria and possible surgery.  Now no plans for surgery, pharmacy consulted to restart warfarin with heparin  bridge.   -PTA warfarin regimen - 4 mg Sat / Sun, 2 mg all other days.  -Heparin  level 0.80, supratherapeutic after initial therapeutic level (HL 0.46) on 2200 units/hr -Hgb 13.6, pltc 269, INR 1.0 -Hematoma stable / improving -Tolerating clear liquid diet, ate some chicken noodle soup and applesauce yesterday  Goal of Therapy:  INR 2-3 Heparin  level 0.3-0.7 units/ml, no bolus Monitor platelets by anticoagulation protocol: Yes   Plan:  Decrease heparin  infusion to 2100 units/hr 8 hour heparin  level Give warfarin 2 mg x 1 Monitor daily heparin  level, CBC, INR  Maurilio Fila, PharmD Clinical Pharmacist 11/22/2023  9:45 AM

## 2023-11-22 NOTE — Progress Notes (Signed)
 [facility] PROGRESS NOTE   Tony Zhang FMW:[id number] DOB: [birth year] DOA: 11/08/2023  PCP: System, Provider Not In  Brief History: 78 y.o. male with past medical history of hypertension, dyslipidemia, CAD, severe aortic stenosis, PVD, history of recurrent venous thrombosis, presented for transcatheter aortic valve replacement.  Following the procedure performed on 1/21 patient reported developing severe abdominal pain.  He was found to have pancreatitis.  Then he required transfer to ICU due to hypotension.  He was stabilized and then transferred back to the floor.  Cardiology and general surgery continues to follow.  There was also concern for a subclavian site hematoma which seems to be stable.  He was seen by cardiothoracic surgery as well.    Consultants: General surgery.  Cardiology.  Cardiothoracic surgery.   Subjective/Interval History: Patient somewhat disappointed that his gallbladder will not be removed during this hospitalization but he understands why the surgeon wants to wait till March.  Otherwise he feels well.  Tolerating his clear liquid.  Looking forward to advancement in his diet.  Abdominal pain is improved.  Had 2 bowel movements in the last 24 hours.    Assessment/Plan:  Acute biliary pancreatitis CT scan showed acute pancreatitis.  Evidence for cholelithiasis.  Patient also underwent ultrasound.  Lipase level was initially in the 600s.  Improvement noted. Underwent MRI/MRCP which showed again cholelithiasis without cholecystitis.  No choledocholithiasis noted. Seen by general surgery.  Initially plan was for cholecystectomy during this hospital stay but due to his persistent symptoms of pancreatitis this has been now deferred to March. CT scan was repeated yesterday by general surgery which continues to show mild to moderate signs of pancreatitis.  No necrosis noted. Will advance to full liquid diet today.  He tolerated clear liquids well yesterday.  He had  bowel movements yesterday.  If he tolerates full liquid then hopefully his core track can be removed tomorrow and he can be advanced to soft diet.   Acute Respiratory Failure with Hypoxia Imaging studies showed emphysema with chronic lung changes.   Continue with nebulizer treatments and inhalers.   Continue with Flutter Valve, Incentive Spirometry, and Guaifenesin  1200 mg po BID Does not use oxygen at home.   Continue to wean down oxygen to maintain saturations greater than 90%.  Currently on 3 L of oxygen via nasal cannula.   Transient Hypotension, improved In the setting of Hypovolemia after Furosemide  and Metoprolol  Administration as well as having poor Po Intake.  He is not on any blood pressure lowering agents currently.   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  Cardiology follows closely.  Acute Urinary Retention Continue with Foley catheter.  Voiding trial can be attempted once more ambulatory. -C/w Tamsulosin  0.8 mg po at bedtime for now   Hematuria -Has a Hx of Prostatomegaly and foley had been placed this Admission -Worsened in the setting of AC -This was discussed with Urology Dr. Alvaro who recommended Supportive Care  Hematuria appears to have improved off of anticoagulation.  Hemoglobin has been stable.  Severe Aortic Stenosis -Status post TAVR during this admission. Cardiology following and managing.  They have resumed anticoagulation.  He is noted to be on heparin  and warfarin.   Previous RLL filling defect in bronchus -Possibly mucus plugging.    Subclavian site hematoma -Worsened in  the setting of heparin  drip Seen by thoracic surgery.  No intervention planned.     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  Was taken off of anticoagulation due to hematuria and subclavian site hematoma. Anticoagulation resumed by cardiology.   No positive Doppler studies noted in our system.  His last Doppler was in 2020 which was negative for DVT.   Hypomagnesemia/hypophosphatemia/hyponatremia Continue to monitor   AKI on CKD Stage 3b, improving and stable Metabolic Acidosis, mild Renal function has improved and stable for the most part.  Monitor urine output.  Avoid nephrotoxic agents.  Hyperkalemia Noted to be 5.5 on 2/2.  Resolved with Lokelma .   DVT Prophylaxis: Subcutaneous heparin  Code Status: Full code Family Communication: Discussed with patient Disposition Plan: Home health versus SNF later this week.     Medications: Scheduled:  atorvastatin   20 mg Per Tube Daily   Chlorhexidine  Gluconate Cloth  6 each Topical Daily   cilostazol   100 mg Per Tube BID   docusate  100 mg Per Tube BID   feeding supplement (PROSource TF20)  60 mL Per Tube Daily   fluticasone  furoate-vilanterol  1 puff Inhalation Daily   gabapentin   400 mg Per Tube Q12H   ipratropium  0.5 mg Nebulization BID   levalbuterol   0.63 mg Nebulization BID   metoprolol  succinate  100 mg Oral Daily   polyethylene glycol  17 g Per Tube Daily   sodium chloride  flush  3 mL Intravenous Q12H   warfarin  2 mg Oral ONCE-1600   Warfarin - Pharmacist Dosing Inpatient   Does not apply q1600   Continuous:  feeding supplement (OSMOLITE 1.5 CAL) Stopped (11/21/23 0016)   heparin  2,200 Units/hr (11/21/23 1547)   PRN:acetaminophen  (TYLENOL ) oral liquid 160 mg/5 mL, dextrose , guaiFENesin , hydrALAZINE , HYDROmorphone  (DILAUDID ) injection, ondansetron  (ZOFRAN ) IV, oxyCODONE , sodium chloride  flush  Antibiotics: Anti-infectives (From admission, onward)    Start     Dose/Rate Route Frequency Ordered Stop   11/08/23 2100  ceFAZolin  (ANCEF ) IVPB 2g/100 mL premix        2 g 200  mL/hr over 30 Minutes Intravenous Every 8 hours 11/08/23 1647 11/09/23 0548   11/08/23 0400  ceFAZolin  (ANCEF ) IVPB 2g/100 mL premix  Status:  Discontinued        2 g 200 mL/hr over 30 Minutes Intravenous To Surgery 11/07/23 1323 11/08/23 1514       Objective:  Vital Signs  Vitals:   11/22/23 0432 11/22/23 0808 11/22/23 0825 11/22/23 0850  BP:  (!) 129/43  (!) 140/42  Pulse:  83  83  Resp: 19 16    Temp:  (!) 97.5 F (36.4 C)    TempSrc:  Oral    SpO2:  98% 96%   Weight: 97.4 kg     Height:        Intake/Output Summary (Last 24 hours) at 11/22/2023 1009 Last data filed at 11/22/2023 0818 Gross per 24 hour  Intake 1601.09 ml  Output 1501 ml  Net 100.09 ml   Filed Weights   11/20/23 0347 11/21/23 0603 11/22/23 0432  Weight: 98.7 kg 98 kg 97.4 kg    General appearance: Awake alert.  In no distress Resp: Clear to auscultation bilaterally.  Normal effort Cardio: S1-S2 is normal regular.  No S3-S4.  No rubs murmurs or bruit GI: Abdomen is soft.  Less tender than before.  Bowel sounds present.  No masses organomegaly. Extremities: No edema.  Physical deconditioning is noted. Neurologic: Alert and oriented x3.  No focal neurological deficits.     Lab Results:  Data Reviewed: I have personally reviewed following labs and reports of the imaging studies  CBC: Recent Labs  Lab 11/16/23 0400 11/17/23 0804 11/18/23 1228 11/21/23 0855 11/22/23 0655  WBC 7.2 8.6 9.8 8.7 10.8*  NEUTROABS 5.8  --   --   --   --   HGB 12.7* 12.9* 14.1 15.0 13.8  HCT 39.0 39.8 42.4 46.6 42.7  MCV 94.9 95.2 94.0 95.9 94.9  PLT 218 265 308 310 269    Basic Metabolic Panel: Recent Labs  Lab 11/16/23 0400 11/17/23 0804 11/17/23 2009 11/18/23 1228 11/18/23 1653 11/19/23 0810 11/19/23 1659 11/20/23 0842 11/21/23 0330 11/22/23 0655  NA 131* 135  --   --   --   --   --  135 135 133*  K 4.4 4.3  --   --   --   --   --  5.5* 4.6 4.9  CL 101 102  --   --   --   --   --  100 104 99  CO2  20* 19*  --   --   --   --   --  26 22 24   GLUCOSE 81 122*  --   --   --   --   --  159* 122* 174*  BUN 16 20  --   --   --   --   --  32* 31* 33*  CREATININE 1.48* 1.41*  --   --   --   --   --  1.44* 1.15 1.24  CALCIUM  8.4* 8.5*  --   --   --   --   --  8.9 8.5* 8.6*  MG 1.7 1.7 1.7 1.7 1.7 1.8 1.7  --   --   --   PHOS 2.5 2.6 2.4* 2.3* 2.3* 2.9 2.4*  --   --   --     GFR: Estimated Creatinine Clearance: 60.3 mL/min (by C-G formula based on SCr of 1.24 mg/dL).  Liver Function Tests: Recent Labs  Lab 11/16/23 0400 11/17/23 0804 11/20/23 0842 11/21/23 0330  AST 36 37 36 31  ALT 20 23 28 25   ALKPHOS 68 73 63 62  BILITOT 1.2 1.1 1.7* 1.2  PROT 5.8* 6.3* 6.4* 6.0*  ALBUMIN 2.2* 2.4* 2.5* 2.6*    Recent Labs  Lab 11/16/23 0400 11/17/23 0804 11/21/23 0855  LIPASE 56* 54* 74*    Coagulation Profile: Recent Labs  Lab 11/18/23 1228 11/19/23 0810 11/20/23 0313 11/21/23 0330 11/22/23 0655  INR 1.3* 1.2 1.2 1.1 1.0    CBG: Recent Labs  Lab 11/21/23 0023 11/21/23 0413 11/21/23 0828 11/21/23 1332 11/21/23 1654  GLUCAP 143* 117* 122* 101* 151*    Radiology Studies: CT ABDOMEN PELVIS W CONTRAST Result Date: 11/21/2023 CLINICAL DATA:  Severe acute pancreatitis. EXAM: CT ABDOMEN AND PELVIS WITH CONTRAST TECHNIQUE: Multidetector CT imaging of the abdomen and pelvis was performed using the standard protocol following bolus administration of intravenous contrast. RADIATION DOSE REDUCTION: This exam was performed according to the departmental dose-optimization program which includes automated exposure control, adjustment of the mA and/or kV according to patient size and/or use of iterative reconstruction technique. CONTRAST:  75mL OMNIPAQUE  IOHEXOL  350 MG/ML SOLN COMPARISON:  11/10/2023 FINDINGS: Lower Chest: No acute findings. Hepatobiliary: No suspicious hepatic masses identified. Stable small  cyst in medial right hepatic lobe. Tiny less than 5 mm gallstones noted. No evidence  of cholecystitis or biliary ductal dilatation. Pancreas: Mild-to-moderate diffuse pancreatic edema and peripancreatic inflammatory changes shows no significant change, consistent with acute pancreatitis. No evidence of pancreatic necrosis or pseudocyst. Spleen: Within normal limits in size and appearance. Adrenals/Urinary Tract: Benign-appearing renal cysts noted bilaterally. No suspicious masses identified. No evidence of ureteral calculi or hydronephrosis. Diffuse bladder wall thickening is seen, presumably due to chronic bladder outlet obstruction given enlarged prostate. Foley catheter is seen within the bladder as well as small amount of high attenuation material likely representing blood clot. Stomach/Bowel: Feeding tube seen in appropriate position, with tip near the duodenum-jejunal junction. No evidence of obstruction, inflammatory process or abnormal fluid collections. Incidental small lipoma again seen in a distal small bowel loop which measures 1.7 cm, and remains stable. Vascular/Lymphatic: No pathologically enlarged lymph nodes. 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 Reproductive:  Stable mild-to-moderately enlarged prostate. Other:  None. Musculoskeletal:  No suspicious bone lesions identified. IMPRESSION: Mild-to-moderate acute pancreatitis, without significant change. No evidence of pancreatic necrosis or pseudocyst. Cholelithiasis. No radiographic evidence of cholecystitis or biliary ductal dilatation. 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 Stable mild-to-moderately enlarged prostate, and findings of chronic bladder outlet obstruction. Foley catheter and probable blood clot noted within the bladder lumen. Electronically Signed   By: Norleen DELENA Kil M.D.   On: 11/21/2023 13:12        LOS:  14 days   Shalisha [employer]ausing [employer] Pager on [email or web address]  11/22/2023, 10:09 AM

## 2023-11-22 NOTE — Progress Notes (Signed)
 Mobility Specialist Progress Note:   11/22/23 0946  Mobility  Activity Ambulated with assistance in hallway  Level of Assistance Standby assist, set-up cues, supervision of patient - no hands on  Assistive Device Front wheel walker  Distance Ambulated (ft) 150 ft  Activity Response Tolerated well  Mobility Referral Yes  Mobility visit 1 Mobility  Mobility Specialist Start Time (ACUTE ONLY) 0931  Mobility Specialist Stop Time (ACUTE ONLY) 0941  Mobility Specialist Time Calculation (min) (ACUTE ONLY) 10 min   Pt received in bed, agreeable to mobility session. Ambulated in hallway with RW, SBA for safety. Tolerated well. Took one standing rest break d/t fatigue. Returned pt to room, all needs met, call bell in reach, bed alarm on.  Michelena Culmer Mobility Specialist Please contact via Special Educational Needs Teacher or  Rehab office at [phone]

## 2023-11-22 NOTE — Progress Notes (Signed)
 Informed to the MD about his BP, no any orders, patient was asymptomatic BP checked again, WNL,  11/22/23 1500  Vitals  BP (!) 93/54  MAP (mmHg) 65  BP Location Right Arm  BP Method Automatic  Patient Position (if appropriate) Lying  ECG Heart Rate 75  Resp 18  MEWS COLOR  MEWS Score Color Green  MEWS Score  MEWS Temp 0  MEWS Systolic 1  MEWS Pulse 0  MEWS RR 0  MEWS LOC 0  MEWS Score 1

## 2023-11-22 NOTE — Plan of Care (Signed)
  Problem: Clinical Measurements: Goal: Ability to maintain clinical measurements within normal limits will improve Outcome: Progressing Goal: Will remain free from infection Outcome: Progressing Goal: Diagnostic test results will improve Outcome: Progressing Goal: Respiratory complications will improve Outcome: Progressing Goal: Cardiovascular complication will be avoided Outcome: Progressing   Problem: Clinical Measurements: Goal: Will remain free from infection Outcome: Progressing   Problem: Clinical Measurements: Goal: Diagnostic test results will improve Outcome: Progressing   Problem: Clinical Measurements: Goal: Respiratory complications will improve Outcome: Progressing   Problem: Nutrition: Goal: Adequate nutrition will be maintained Outcome: Progressing

## 2023-11-23 DIAGNOSIS — Z952 Presence of prosthetic heart valve: Secondary | ICD-10-CM | POA: Diagnosis not present

## 2023-11-23 DIAGNOSIS — I2510 Atherosclerotic heart disease of native coronary artery without angina pectoris: Secondary | ICD-10-CM | POA: Diagnosis not present

## 2023-11-23 LAB — CBC
HCT: 40.5 % (ref 39.0–52.0)
Hemoglobin: 13.2 g/dL (ref 13.0–17.0)
MCH: 31 pg (ref 26.0–34.0)
MCHC: 32.6 g/dL (ref 30.0–36.0)
MCV: 95.1 fL (ref 80.0–100.0)
Platelets: 255 10*3/uL (ref 150–400)
RBC: 4.26 MIL/uL (ref 4.22–5.81)
RDW: 15.3 % (ref 11.5–15.5)
WBC: 11.7 10*3/uL — ABNORMAL HIGH (ref 4.0–10.5)
nRBC: 0 % (ref 0.0–0.2)

## 2023-11-23 LAB — BASIC METABOLIC PANEL
Anion gap: 11 (ref 5–15)
BUN: 34 mg/dL — ABNORMAL HIGH (ref 8–23)
CO2: 23 mmol/L (ref 22–32)
Calcium: 8.7 mg/dL — ABNORMAL LOW (ref 8.9–10.3)
Chloride: 97 mmol/L — ABNORMAL LOW (ref 98–111)
Creatinine, Ser: 1.4 mg/dL — ABNORMAL HIGH (ref 0.61–1.24)
GFR, Estimated: 51 mL/min — ABNORMAL LOW (ref >60)
Glucose, Bld: 173 mg/dL — ABNORMAL HIGH (ref 70–99)
Potassium: 4.6 mmol/L (ref 3.5–5.1)
Sodium: 131 mmol/L — ABNORMAL LOW (ref 135–145)

## 2023-11-23 LAB — GLUCOSE, CAPILLARY
Glucose-Capillary: 149 mg/dL — ABNORMAL HIGH (ref 70–99)
Glucose-Capillary: 150 mg/dL — ABNORMAL HIGH (ref 70–99)
Glucose-Capillary: 158 mg/dL — ABNORMAL HIGH (ref 70–99)
Glucose-Capillary: 168 mg/dL — ABNORMAL HIGH (ref 70–99)
Glucose-Capillary: 188 mg/dL — ABNORMAL HIGH (ref 70–99)

## 2023-11-23 LAB — PROTIME-INR
INR: 1 (ref 0.8–1.2)
Prothrombin Time: 13.5 s (ref 11.4–15.2)

## 2023-11-23 LAB — HEPARIN LEVEL (UNFRACTIONATED): Heparin Unfractionated: 0.63 [IU]/mL (ref 0.30–0.70)

## 2023-11-23 MED ORDER — WARFARIN SODIUM 2 MG PO TABS
2.0000 mg | ORAL_TABLET | Freq: Once | ORAL | Status: AC
  Administered 2023-11-23: 2 mg via ORAL
  Filled 2023-11-23: qty 1

## 2023-11-23 MED ORDER — POLYETHYLENE GLYCOL 3350 17 G PO PACK: 17.0000 g | PACK | Freq: Every day | ORAL | Status: DC | PRN

## 2023-11-23 MED ORDER — ENSURE ENLIVE PO LIQD
237.0000 mL | Freq: Two times a day (BID) | ORAL | Status: DC
  Administered 2023-11-23: 237 mL via ORAL

## 2023-11-23 MED ORDER — DOCUSATE SODIUM 50 MG/5ML PO LIQD: 100.0000 mg | Freq: Every day | ORAL | Status: DC | PRN

## 2023-11-23 MED ORDER — LOPERAMIDE HCL 2 MG PO CAPS
4.0000 mg | ORAL_CAPSULE | Freq: Once | ORAL | Status: AC
  Administered 2023-11-23: 4 mg via ORAL
  Filled 2023-11-23: qty 2

## 2023-11-23 MED ORDER — ADULT MULTIVITAMIN W/MINERALS CH
1.0000 | ORAL_TABLET | Freq: Every day | ORAL | Status: DC
  Administered 2023-11-24: 1 via ORAL
  Filled 2023-11-23: qty 1

## 2023-11-23 NOTE — Progress Notes (Signed)
 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 Tolerating a liquid diet and wants Cortrak out. Had too much stool softener and had diarrhea. No CP or SOB.   Inpatient Medications    Scheduled Meds:  atorvastatin   20 mg Per Tube Daily   Chlorhexidine  Gluconate Cloth  6 each Topical Daily   cilostazol   100 mg Per Tube BID   docusate  100 mg Per Tube BID   feeding supplement (PROSource TF20)  60 mL Per Tube Daily   fluticasone  furoate-vilanterol  1 puff Inhalation Daily   gabapentin   400 mg Per Tube Q12H   ipratropium  0.5 mg Nebulization BID   levalbuterol   0.63 mg Nebulization BID   metoprolol  succinate  100 mg Oral Daily   polyethylene glycol  17 g Per Tube Daily   sodium chloride  flush  3 mL Intravenous Q12H   warfarin  2 mg Oral ONCE-1600   Warfarin - Pharmacist Dosing Inpatient   Does not apply q1600   Continuous Infusions:  feeding supplement (OSMOLITE 1.5 CAL) 1,000 mL (11/23/23 0420)    heparin  2,000 Units/hr (11/23/23 0838)   PRN Meds: acetaminophen  (TYLENOL ) oral liquid 160 mg/5 mL, dextrose , guaiFENesin , hydrALAZINE , HYDROmorphone  (DILAUDID ) injection, ondansetron  (ZOFRAN ) IV, oxyCODONE , sodium chloride  flush   Vital Signs    Vitals:   11/22/23 2357 11/23/23 0404 11/23/23 0500 11/23/23 0804  BP: 121/87 (!) 101/57 (!) 104/42   Pulse: 83 91  83  Resp: 18 16 18    Temp: 98.3 F (36.8 C) 98.1 F (36.7 C)  (!) 97.5 F (36.4 C)  TempSrc: Oral Oral  Oral  SpO2: 95% 96% 94%   Weight:   96.9 kg   Height:        Intake/Output Summary (Last 24 hours) at 11/23/2023 1111 Last data filed at 11/22/2023 2241 Gross per 24 hour  Intake 690 ml  Output 900 ml  Net -210 ml   Filed Weights   11/21/23 0603 11/22/23 0432 11/23/23 0500  Weight: 98 kg 97.4 kg 96.9 kg    Physical Exam    GEN: Well nourished, well developed, obese HEENT: Grossly normal.  Neck: Supple, no JVD, Cardiac: RRR, no murmurs, rubs, or gallops. No clubbing, cyanosis, edema.   Respiratory:  Respirations regular and unlabored, clear to auscultation bilaterally. GI: improvement in tenderness to palpation, cortrak in place MS: no deformity or atrophy. Skin: warm and dry, no rash. Groin/radial/subclvian site stable. Subclavian site with ecchymoses and persistent swelling c/w seroma  Neuro:  Strength and sensation  are intact. Psych: AAOx3.  Normal affect.  Labs    CBC Recent Labs    11/22/23 0655 11/23/23 0329  WBC 10.8* 11.7*  HGB 13.8 13.2  HCT 42.7 40.5  MCV 94.9 95.1  PLT 269 255   Basic Metabolic Panel Recent Labs    97/95/74 0655 11/23/23 0828  NA 133* 131*  K 4.9 4.6  CL 99 97*  CO2 24 23  GLUCOSE 174* 173*  BUN 33* 34*  CREATININE 1.24 1.40*  CALCIUM  8.6* 8.7*   Liver Function Tests Recent Labs    11/21/23 0330  AST 31  ALT 25  ALKPHOS 62  BILITOT 1.2  PROT 6.0*  ALBUMIN 2.6*   Recent Labs    11/21/23 0855  LIPASE 74*     Telemetry    Sinus HR in 80s -  Personally Reviewed  ECG    No new tracing  Patient Profile     Tony Zhang is a 78 y.o. 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 Pt developed acute abdominal pain after TAVR and found to have acute gallstone pancreatitis.   Assessment & Plan    Severe AS: s/p successful TAVR with a 26 mm Edwards Sapien 3 Ultra Resilia THV via the TF approach on 11/09/23. Post operative echo showed EF 65%, normally functioning TAVR with a mean gradient of 5 mmHg and no PVL. Back on Coumadin  with heparin  bridge  Subclavian seroma vs hematoma: no interventions plans, will hopefully self resolve. Seen by Dr. Lucas.    Symptomatic hypotension: resolved with IV fluids. Likely related to acute pancreatitis.   HTN: BP improved on home Toprol  XL 100 mg daily.   Sinus tach with short runs of SVT: resolved with resumption of home Toprol  XL 100 mg daily.   Acute on chronic HFpEF: as evidenced by an elevated LVEDP ~28 mm hg at the time of TAVR. Treated with one dose of IV lasix  40mg /KDur on 1/22. Then Lasix , Jardiance  & Toprol -XL held given AKI and hypotension. Thankfully, has not had any s/s of acute CHF. BNP 255. Will hold off on Lasix  for now given limited oral intake.  AKI with baseline CKD stage IIIb: baseline creat 1.5-2.4. Creat peaked at 3.8. Likely multifactorial with contrast exposure during TAVR, hypotension and poor PO intake with acute pancreatitis. Resolved with IV fluids. Creatinine ~ 1.4 today which is his baseline.   Hyperkalemia: K 5.5. resolved after 1 dose of Lokelma .    Acute gallstone pancreatitis: CT scan showed acute pancreatitis as well as gallstones. s/p Cortrak placement. No plans for surgery this admission. Scheduled for lap-chole on 3/4 with Dr. Ebbie. Escalating diet to soft foods. Appreciate surgery and hospital  medicine assistance.    Acute urinary retention: continue foley and Flomax . Would consider a voiding trial.   Constipation: continue colace/miralax . Now having diarrhea. Will change stool softeners to PRN   Hyponatremia: NA 131 today.     CAD: s/p DES to LAD 2023. Pre op [facility] 09/28/23 showed severe calcified stenosis of the RCA unchanged from prior in 2023, patent mid LAD stent with moderate nonobstructive disease proximally, patent left circumflex, and patent subclavian with mild stenosis. Plan for medical management of CAD. Continue on atorvastatin  40 mg daily, Repatha  140mg  q 2weeks. No aspirin  given chronic OAC.    PAD: extensive PAD with known occlusion of  the distal abdominal aorta, renal artery stenosis, celiac/mesenteric artery stenosis, and total RICA occlusion and 40-59% LICA occlusion. Continue medical management with atorvastatin  40 mg daily, Pletal  100mg  BID, Repatha  140mg  q 2weeks. No aspirin  given chronic OAC.   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    DVT/PE: started back on Coumadin  with heparin  bridge on 2/3  Previous RLL filling defect in bronchus: possibly mucus plugging. Repeat chest CT chest was reassuring that the previously seen endobronchial lesion was benign. No follow up CT required for this.  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   Bonney Lamarr Hummer, PA-C  11/23/2023, 11:11 AM  Pager [phone]

## 2023-11-23 NOTE — Progress Notes (Signed)
 Physical Therapy Treatment Patient Details Name: Tony Zhang MRN: [id number] DOB: [birth year] Today's Date: 11/23/2023   History of Present Illness 78 yo male adm 1/21 for TAVR. Acute course complicated by acute pancreatitis, gallstones, urinary retention and symptomatic hypotension. 1/28 Concern for hematoma at [facility]. PMHx: CAD, NSTEMI, HFrEF, PAD, DVT/PE, HTN, COPD, CKD, neuropathy, and gout.    PT Comments  PT session limited today d/t pt c/o upset stomach, feeling nauseous, and report of recent episode of diarrhea. He was willing to participate in short-distance gait training, but declined use of RW opting to push the IV pole and use unilateral handrail in hallway. Pt engaged in ambulation on RA where he was able to maintain SpO2 90-93% for the majority of gait; however, desaturated to 86% when returning to the room requiring 1L O2 via  to recovery. He is unsteady during gait, encouraged use of RW for increased stability when mobilizing. Will continue to follow acutely and advance appropriately.     If plan is discharge home, recommend the following: A little help with walking and/or transfers;Assist for transportation;Assistance with cooking/housework;Help with stairs or ramp for entrance;A little help with bathing/dressing/bathroom   Can travel by private vehicle     Yes  Equipment Recommendations  None recommended by PT (Pt already has DME)    Recommendations for Other Services       Precautions / Restrictions Precautions Precautions: Fall Precaution Comments: Cortrak Restrictions Weight Bearing Restrictions Per Provider Order: No     Mobility  Bed Mobility Overal bed mobility: Modified Independent Bed Mobility: Supine to Sit     Supine to sit: HOB elevated     General bed mobility comments: HOB at 30deg. Pt able to transition to EOB without use of rails.    Transfers Overall transfer level: Needs assistance Equipment used: None Transfers: Sit to/from Stand Sit to  Stand: Supervision           General transfer comment: Pt initially reaching for IV pole to assist in standing up. Cued pt to push up from bed for safety. Good eccentric control and scooting back when in recliner chair.    Ambulation/Gait Ambulation/Gait assistance: Contact guard assist Gait Distance (Feet): 60 Feet (1x60, standing rest break; 1x40, standing rest break; 1x20, standing rest break; 1x10) Assistive device: IV Pole Gait Pattern/deviations: Step-through pattern, Decreased step length - right, Decreased step length - left   Gait velocity interpretation: <1.31 ft/sec, indicative of household ambulator   General Gait Details: Pt declined use of RW this session, opting to push the IV pole and using unilateral handrail in hallway for support. Pt unsteady, no overt LOB, but evident sway observed. Limited distance d/t pt c/o an upset stomach.   Stairs             Wheelchair Mobility     Tilt Bed    Modified Rankin (Stroke Patients Only)       Balance Overall balance assessment: Needs assistance Sitting-balance support: No upper extremity supported, Feet supported Sitting balance-Leahy Scale: Fair     Standing balance support: Bilateral upper extremity supported, During functional activity Standing balance-Leahy Scale: Poor Standing balance comment: Pt needs UE support for stability with mobility. 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 Would benefit from RW, educated pt on use of AD currerntly.                            Cognition Arousal: Alert Behavior During Therapy: [facility]  for tasks assessed/performed Overall Cognitive Status: Within Functional Limits for tasks assessed                                          Exercises      General Comments General comments (skin integrity, edema, etc.): Pt engaged in activity on RA. He maintained SpO2>90% for the majority of gait. Once returned to room desat to 86%, cued PLB, applied 1L for  recovery.      Pertinent Vitals/Pain Pain Assessment Pain Assessment: No/denies pain    Home Living                          Prior Function            PT Goals (current goals can now be found in the care plan section) Acute Rehab PT Goals Patient Stated Goal: Go Home Progress towards PT goals: Progressing toward goals    Frequency    Min 1X/week      PT Plan      Co-evaluation              AM-PAC PT 6 Clicks Mobility   Outcome Measure  Help needed turning from your back to your side while in a flat bed without using bedrails?: A Little Help needed moving from lying on your back to sitting on the side of a flat bed without using bedrails?: A Little Help needed moving to and from a bed to a chair (including a wheelchair)?: A Little Help needed standing up from a chair using your arms (e.g., wheelchair or bedside chair)?: A Little Help needed to walk in hospital room?: A Little Help needed climbing 3-5 steps with a railing? : A Lot 6 Click Score: 17    End of Session Equipment Utilized During Treatment: Gait belt;Oxygen Activity Tolerance: Other (comment) (Patient limited by nausea and diarrhea) Patient left: in chair;with call bell/phone within reach Nurse Communication: Mobility status PT Visit Diagnosis: Other abnormalities of gait and mobility (R26.89);Unsteadiness on feet (R26.81)     Time: 8868-8853 PT Time Calculation (min) (ACUTE ONLY): 15 min  Charges:    $Gait Training: 8-22 mins                       Tony Zhang, PT, DPT Acute Rehabilitation Services Office: [phone] Secure Chat Preferred   Tony Zhang 11/23/2023, 12:53 PM

## 2023-11-23 NOTE — Progress Notes (Addendum)
 PHARMACY - ANTICOAGULATION CONSULT NOTE  Pharmacy Consult:  IV heparin  Indication: VTE history  Allergies  Allergen Reactions   Codeine Hives and Other (See Comments)    Takes benadryl  to stop allergic reactions    Patient Measurements: Height: 6' 1 (185.4 cm) Weight: 96.9 kg (213 lb 10 oz) IBW/kg (Calculated) : 79.9 Heparin  dosing weight = 100 kg  Vital Signs: Temp: 98.1 F (36.7 C) (02/05 0404) Temp Source: Oral (02/05 0404) BP: 104/42 (02/05 0500) Pulse Rate: 91 (02/05 0404)  Labs: Recent Labs     0000 11/20/23 0842 11/21/23 0330 11/21/23 0855 11/21/23 2134 11/22/23 0655 11/22/23 1647 11/23/23 0329  HGB   < >  --   --  15.0  --  13.8  --  13.2  HCT  --   --   --  46.6  --  42.7  --  40.5  PLT  --   --   --  310  --  269  --  255  LABPROT  --   --  14.1  --   --  13.6  --  13.5  INR  --   --  1.1  --   --  1.0  --  1.0  HEPARINUNFRC  --   --   --   --    < > 0.80* 0.63 0.63  CREATININE  --  1.44* 1.15  --   --  1.24  --   --    < > = values in this interval not displayed.    Estimated Creatinine Clearance: 60.2 mL/min (by C-G formula based on SCr of 1.24 mg/dL).   Assessment: 78 year old male s/p TAVR 1/21, on warfarin PTA for hx recurrent VTE.  Therapeutic anticoagulation held since 1/28 (last warfarin dose 1/26) with hematoma, hematuria and possible surgery.  Now no plans for surgery, pharmacy consulted to restart warfarin with heparin  bridge.   -PTA warfarin regimen - 4 mg Sat / Sun, 2 mg all other days.  -Heparin  level therapeutic at 0.63 units/mL (unchanged) after rate decreased slightly yesterday evening -Hgb 13.2, pltc 255, INR 1.0 -No bleeding reported overnight, hematoma stable/improved -Tolerating clear liquid diet, hopeful to remove Cortrak 2/5 and adv diet further  Goal of Therapy:  INR 2-3 Heparin  level 0.3-0.7 units/ml, no bolus Monitor platelets by anticoagulation protocol: Yes   Plan:  Reduce heparin  drip slightly to 2000 units/hr  since at upper end of goal Give warfarin 2 mg x 1 Monitor daily heparin  level, CBC, INR  Prudence Dollar, PharmD, BCPS 11/23/2023 7:25 AM

## 2023-11-23 NOTE — Progress Notes (Addendum)
 Nutrition Follow-up  DOCUMENTATION CODES:   Not applicable  INTERVENTION:  Ensure Enlive po BID, each supplement provides 350 kcal and 20 grams of protein Magic cup TID with meals, each supplement provides 290 kcal and 9 grams of protein MVI w/ minerals Continue to advance diet, as tolerated - recommend liberalizing  NUTRITION DIAGNOSIS:  Inadequate oral intake related to acute illness as evidenced by estimated needs. - new dx established as TF/Cortrak discontinued  GOAL:  Patient will meet greater than or equal to 90% of their needs - progressing  MONITOR:  PO intake, Supplement acceptance, Diet advancement  REASON FOR ASSESSMENT:  Consult Enteral/tube feeding initiation and management  ASSESSMENT:  Pt is 78 y.o. male w/ PMH: HTN, dyslipidemia, CAD, severe aortic stenosis, COPD, CKD,  PVD, hx of recurrent venous thrombosis. Presented for transcatheter aortic valve replacement. He developed pancreatitis s/p procedure.  1/21 TAVR procedure 1/24 ICU d/t progressive decline 1/26 back to progressive unit  1/29 cortrak placed (distal duodenem), trickles initiated 1/31 TF advanced 2/3 CT for pancreatitis complications 2/5 Cotrak removed/TF discontinued   Patient continues on tube feeds r/t ileus, which is now resolved with multiple BMs documented on 2/4 and 2/5. Surgery and hospital medicine now amicable to advancing diet as tolerated and removal of Cortrak. Pt intake improving with advancement in textures. Recommend liberalized diet to encourage intake. States he will accept supplements as well. Continuing to move with PT. Endorses no N/V.    Admit Weight: 102.1kg Current Weight: 96.9kg   Foley catheter still in place. Had UOP yesterday and already today. Some generalized edema. Crt stabilizing.  Intake/Output Summary (Last 24 hours) at 11/23/2023 1442 Last data filed at 11/23/2023 9161 Gross per 24 hour  Intake 690 ml  Output 1500 ml  Net -810 ml    Net IO Since  Admission: -4,117.76 mL [11/23/23 1442]   Meds: no nutrition-related medications of note, Miralax  and docusate have been D/C'ed due to diarrhea. Pt reports BMs every 3-4 days at baseline.   Labs:  Na+: 135>133>131 CBGs 149-188 over 24 hours A1c: 5.5 (02/2023) Crt 1.15>1.24>1.40(H) BUN 31>33>34 (H) PHOS 2.3>2.9>2.4(L) WBC 8.7>10.8>11.7 (H)  Diet Order:   Diet Order             DIET SOFT Room service appropriate? Yes; Fluid consistency: Thin  Diet effective now             EDUCATION NEEDS:  No education needs have been identified at this time  Skin:  Skin Assessment: Reviewed RN Assessment  Last BM:  2/5 - type 5  Height:  Ht Readings from Last 1 Encounters:  11/08/23 6' 1 (1.854 m)    Weight:  Wt Readings from Last 1 Encounters:  11/23/23 96.9 kg   Ideal Body Weight:  83.6 kg  BMI:  Body mass index is 28.18 kg/m.  Estimated Nutritional Needs:   Kcal:  2200-2400kcal  Protein:  110-120g  Fluid:  >2L/day  Blair Deaner MS, RD, LDN Registered Dietitian Clinical Nutrition RD Inpatient Contact Info in Amion

## 2023-11-23 NOTE — Progress Notes (Signed)
 SATURATION QUALIFICATIONS: (This note is used to comply with regulatory documentation for home oxygen)  Patient Saturations on Room Air at Rest = 96%  Patient Saturations on Room Air while Ambulating = 86%  Patient Saturations on 1 Liters of oxygen while Ambulating = 91%  Please briefly explain why patient needs home oxygen: Pt desaturated towards the end of gait, unable to recover with breathing strategies, required 1L O2 via [place] for SpO2 >90%.  Randall SAUNDERS, PT, DPT Acute Rehabilitation Services Office: [phone] Secure Chat Preferred

## 2023-11-23 NOTE — Progress Notes (Signed)
 [facility] PROGRESS NOTE   Tony Zhang FMW:[id number] DOB: 06-Jul-1946 DOA: 11/08/2023  PCP: System, Provider Not In  Brief History: 78 y.o. male with past medical history of hypertension, dyslipidemia, CAD, severe aortic stenosis, PVD, history of recurrent venous thrombosis, presented for transcatheter aortic valve replacement.  Following the procedure performed on 1/21 patient reported developing severe abdominal pain.  He was found to have pancreatitis.  Then he required transfer to ICU due to hypotension.  He was stabilized and then transferred back to the floor.  Cardiology and general surgery continues to follow.  There was also concern for a subclavian site hematoma which seems to be stable.  He was seen by cardiothoracic surgery as well.       Subjective/Interval History: Tolerating full liquids with minimal pain   Assessment/Plan:  Acute biliary pancreatitis CT scan showed acute pancreatitis.  Evidence for cholelithiasis.  Patient also underwent ultrasound.  Lipase level was initially in the 600s.  Improvement noted. Underwent MRI/MRCP which showed again cholelithiasis without cholecystitis.  No choledocholithiasis noted. Seen by general surgery.  Initially plan was for cholecystectomy during this hospital stay but due to his persistent symptoms of pancreatitis this has been now deferred to March. CT scan was repeated yesterday by general surgery which continues to show mild to moderate signs of pancreatitis.  No necrosis noted. -tolerating full liquids-- advance to soft   Acute Respiratory Failure with Hypoxia Imaging studies showed emphysema with chronic lung changes.   Continue with nebulizer treatments and inhalers.   Continue with Flutter Valve, Incentive Spirometry, and Guaifenesin  1200 mg po BID Does not use oxygen at home.   Continue to wean down oxygen to maintain saturations greater than 90% -will need home O2 study prior to d/c   Transient Hypotension,  improved In the setting of Hypovolemia after Furosemide  and Metoprolol  Administration as well as having poor Po Intake.  He is not on any blood pressure lowering agents currently.   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  Cardiology follows closely.  Acute Urinary Retention Continue with Foley catheter.  Voiding trial can be attempted once more ambulatory. -C/w Tamsulosin  0.8 mg po at bedtime for now   Hematuria -Has a Hx of Prostatomegaly and foley had been placed this Admission -Worsened in the setting of AC -This was discussed with Urology Dr. Alvaro who recommended Supportive Care  Hematuria appears to have improved off of anticoagulation.  Hemoglobin has been stable.  Severe Aortic Stenosis -Status post TAVR during this admission. Cardiology following and managing.  They have resumed anticoagulation.  He is noted to be on heparin  and warfarin.   Previous RLL filling defect in bronchus -Possibly mucus plugging.    Subclavian site hematoma -Worsened in the setting of heparin  drip Seen by thoracic surgery.  No intervention planned.     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  Was taken off of anticoagulation due to hematuria and subclavian site hematoma. Anticoagulation resumed by cardiology.   No positive Doppler studies noted in our system.  His last Doppler was in 2020 which was negative for DVT.   Hypomagnesemia/hypophosphatemia/hyponatremia Continue to monitor   AKI on CKD Stage 3b,  improving and stable Metabolic Acidosis, mild Renal function has improved and stable for the most part.  Monitor urine output.  Avoid nephrotoxic agents.  Hyperkalemia Noted  to be 5.5 on 2/2.  Resolved with Lokelma .   DVT Prophylaxis: Subcutaneous heparin  Code Status: Full code Family Communication: Discussed with patient Disposition Plan: per cards     Medications: Scheduled:  atorvastatin   20 mg Per Tube Daily   Chlorhexidine  Gluconate Cloth  6 each Topical Daily   cilostazol   100 mg Per Tube BID   feeding supplement (PROSource TF20)  60 mL Per Tube Daily   fluticasone  furoate-vilanterol  1 puff Inhalation Daily   gabapentin   400 mg Per Tube Q12H   ipratropium  0.5 mg Nebulization BID   levalbuterol   0.63 mg Nebulization BID   metoprolol  succinate  100 mg Oral Daily   sodium chloride  flush  3 mL Intravenous Q12H   warfarin  2 mg Oral ONCE-1600   Warfarin - Pharmacist Dosing Inpatient   Does not apply q1600   Continuous:  feeding supplement (OSMOLITE 1.5 CAL) 1,000 mL (11/23/23 0420)   heparin  2,000 Units/hr (11/23/23 9161)   PRN:acetaminophen  (TYLENOL ) oral liquid 160 mg/5 mL, dextrose , docusate, guaiFENesin , hydrALAZINE , HYDROmorphone  (DILAUDID ) injection, ondansetron  (ZOFRAN ) IV, oxyCODONE , polyethylene glycol, sodium chloride  flush  Antibiotics: Anti-infectives (From admission, onward)    Start     Dose/Rate Route Frequency Ordered Stop   11/08/23 2100  ceFAZolin  (ANCEF ) IVPB 2g/100 mL premix        2 g 200 mL/hr over 30 Minutes Intravenous Every 8 hours 11/08/23 1647 11/09/23 0548   11/08/23 0400  ceFAZolin  (ANCEF ) IVPB 2g/100 mL premix  Status:  Discontinued        2 g 200 mL/hr over 30 Minutes Intravenous To Surgery 11/07/23 1323 11/08/23 1514       Objective:  Vital Signs  Vitals:   11/22/23 2357 11/23/23 0404 11/23/23 0500 11/23/23 0804  BP: 121/87 (!) 101/57 (!) 104/42   Pulse: 83 91  83  Resp: 18 16 18    Temp: 98.3 F (36.8 C) 98.1 F (36.7 C)  (!) 97.5 F (36.4 C)  TempSrc: Oral Oral  Oral  SpO2: 95% 96% 94%   Weight:   96.9 kg   Height:        Intake/Output Summary (Last 24 hours) at 11/23/2023 1115 Last data  filed at 11/22/2023 2241 Gross per 24 hour  Intake 690 ml  Output 900 ml  Net -210 ml   Filed Weights   11/21/23 0603 11/22/23 0432 11/23/23 0500  Weight: 98 kg 97.4 kg 96.9 kg     General: Appearance:     Overweight male in no acute distress     Lungs:     Clear to auscultation bilaterally, respirations unlabored  Heart:    Normal heart rate. Normal rhythm.     MS:   All extremities are intact.   Neurologic:   Awake, alert     Lab Results:  Data Reviewed: I have personally reviewed following labs and reports of the imaging studies  CBC: Recent Labs  Lab 11/17/23 0804 11/18/23 1228 11/21/23 0855 11/22/23 0655 11/23/23 0329  WBC 8.6 9.8 8.7 10.8* 11.7*  HGB 12.9* 14.1 15.0 13.8 13.2  HCT 39.8 42.4 46.6 42.7 40.5  MCV 95.2 94.0 95.9 94.9 95.1  PLT 265 308 310 269 255    Basic Metabolic Panel: Recent Labs  Lab 11/17/23 0804 11/17/23 2009 11/18/23 1228 11/18/23 1653 11/19/23 0810 11/19/23 1659 11/20/23 9157  11/21/23 0330 11/22/23 0655 11/23/23 0828  NA 135  --   --   --   --   --  135 135 133* 131*  K 4.3  --   --   --   --   --  5.5* 4.6 4.9 4.6  CL 102  --   --   --   --   --  100 104 99 97*  CO2 19*  --   --   --   --   --  26 22 24 23   GLUCOSE 122*  --   --   --   --   --  159* 122* 174* 173*  BUN 20  --   --   --   --   --  32* 31* 33* 34*  CREATININE 1.41*  --   --   --   --   --  1.44* 1.15 1.24 1.40*  CALCIUM  8.5*  --   --   --   --   --  8.9 8.5* 8.6* 8.7*  MG 1.7 1.7 1.7 1.7 1.8 1.7  --   --   --   --   PHOS 2.6 2.4* 2.3* 2.3* 2.9 2.4*  --   --   --   --     GFR: Estimated Creatinine Clearance: 53.3 mL/min (A) (by C-G formula based on SCr of 1.4 mg/dL (H)).  Liver Function Tests: Recent Labs  Lab 11/17/23 0804 11/20/23 0842 11/21/23 0330  AST 37 36 31  ALT 23 28 25   ALKPHOS 73 63 62  BILITOT 1.1 1.7* 1.2  PROT 6.3* 6.4* 6.0*  ALBUMIN 2.4* 2.5* 2.6*    Recent Labs  Lab 11/17/23 0804 11/21/23 0855  LIPASE 54* 74*     Coagulation Profile: Recent Labs  Lab 11/19/23 0810 11/20/23 0313 11/21/23 0330 11/22/23 0655 11/23/23 0329  INR 1.2 1.2 1.1 1.0 1.0    CBG: Recent Labs  Lab 11/22/23 2018 11/23/23 0000 11/23/23 0401 11/23/23 0801 11/23/23 1102  GLUCAP 171* 150* 149* 188* 158*    Radiology Studies: CT ABDOMEN PELVIS W CONTRAST Result Date: 11/21/2023 CLINICAL DATA:  Severe acute pancreatitis. EXAM: CT ABDOMEN AND PELVIS WITH CONTRAST TECHNIQUE: Multidetector CT imaging of the abdomen and pelvis was performed using the standard protocol following bolus administration of intravenous contrast. RADIATION DOSE REDUCTION: This exam was performed according to the departmental dose-optimization program which includes automated exposure control, adjustment of the mA and/or kV according to patient size and/or use of iterative reconstruction technique. CONTRAST:  75mL OMNIPAQUE  IOHEXOL  350 MG/ML SOLN COMPARISON:  11/10/2023 FINDINGS: Lower Chest: No acute findings. Hepatobiliary: No suspicious hepatic masses identified. Stable small cyst in medial right hepatic lobe. Tiny less than 5 mm gallstones noted. No evidence of cholecystitis or biliary ductal dilatation. Pancreas: Mild-to-moderate diffuse pancreatic edema and peripancreatic inflammatory changes shows no significant change, consistent with acute pancreatitis. No evidence of pancreatic necrosis or pseudocyst. Spleen: Within normal limits in size and appearance. Adrenals/Urinary Tract: Benign-appearing renal cysts noted bilaterally. No suspicious masses identified. No evidence of ureteral calculi or hydronephrosis. Diffuse bladder wall thickening is seen, presumably due to chronic bladder outlet obstruction given enlarged prostate. Foley catheter is seen within the bladder as well as small amount of high attenuation material likely representing blood clot. Stomach/Bowel: Feeding tube seen in appropriate position, with tip near the duodenum-jejunal junction. No  evidence of obstruction, inflammatory process or abnormal fluid collections. Incidental small lipoma again seen in a distal small bowel  loop which measures 1.7 cm, and remains stable. Vascular/Lymphatic: No pathologically enlarged lymph nodes. 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 Reproductive:  Stable mild-to-moderately enlarged prostate. Other:  None. Musculoskeletal:  No suspicious bone lesions identified. IMPRESSION: Mild-to-moderate acute pancreatitis, without significant change. No evidence of pancreatic necrosis or pseudocyst. Cholelithiasis. No radiographic evidence of cholecystitis or biliary ductal dilatation. 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 Stable mild-to-moderately enlarged prostate, and findings of chronic bladder outlet obstruction. Foley catheter and probable blood clot noted within the bladder lumen. Electronically Signed   By: Norleen DELENA Kil M.D.   On: 11/21/2023 13:12        LOS: 15 days   Denijah Karrer U Blyss Lugar  [facility] Pager on [email or web address]  11/23/2023, 11:15 AM

## 2023-11-24 ENCOUNTER — Other Ambulatory Visit (HOSPITAL_COMMUNITY): Payer: Self-pay

## 2023-11-24 DIAGNOSIS — Z952 Presence of prosthetic heart valve: Secondary | ICD-10-CM | POA: Diagnosis not present

## 2023-11-24 LAB — BASIC METABOLIC PANEL
Anion gap: 12 (ref 5–15)
BUN: 35 mg/dL — ABNORMAL HIGH (ref 8–23)
CO2: 18 mmol/L — ABNORMAL LOW (ref 22–32)
Calcium: 8.4 mg/dL — ABNORMAL LOW (ref 8.9–10.3)
Chloride: 100 mmol/L (ref 98–111)
Creatinine, Ser: 1.4 mg/dL — ABNORMAL HIGH (ref 0.61–1.24)
GFR, Estimated: 51 mL/min — ABNORMAL LOW (ref >60)
Glucose, Bld: 115 mg/dL — ABNORMAL HIGH (ref 70–99)
Potassium: 4.6 mmol/L (ref 3.5–5.1)
Sodium: 130 mmol/L — ABNORMAL LOW (ref 135–145)

## 2023-11-24 LAB — CBC
HCT: 42 % (ref 39.0–52.0)
Hemoglobin: 13.3 g/dL (ref 13.0–17.0)
MCH: 31.1 pg (ref 26.0–34.0)
MCHC: 31.7 g/dL (ref 30.0–36.0)
MCV: 98.4 fL (ref 80.0–100.0)
Platelets: 204 10*3/uL (ref 150–400)
RBC: 4.27 MIL/uL (ref 4.22–5.81)
RDW: 15.5 % (ref 11.5–15.5)
WBC: 10 10*3/uL (ref 4.0–10.5)
nRBC: 0 % (ref 0.0–0.2)

## 2023-11-24 LAB — HEPARIN LEVEL (UNFRACTIONATED): Heparin Unfractionated: 0.36 [IU]/mL (ref 0.30–0.70)

## 2023-11-24 LAB — PROTIME-INR
INR: 1.1 (ref 0.8–1.2)
Prothrombin Time: 14.1 s (ref 11.4–15.2)

## 2023-11-24 MED ORDER — CILOSTAZOL 100 MG PO TABS
100.0000 mg | ORAL_TABLET | Freq: Two times a day (BID) | ORAL | Status: DC
Start: 2023-11-24 — End: 2023-11-24
  Administered 2023-11-24: 100 mg via ORAL
  Filled 2023-11-24 (×3): qty 1

## 2023-11-24 MED ORDER — ATORVASTATIN CALCIUM 10 MG PO TABS: 20.0000 mg | ORAL_TABLET | Freq: Every day | ORAL | Status: DC

## 2023-11-24 MED ORDER — WARFARIN SODIUM 2 MG PO TABS
4.0000 mg | ORAL_TABLET | Freq: Once | ORAL | Status: AC
  Administered 2023-11-24: 4 mg via ORAL
  Filled 2023-11-24: qty 2

## 2023-11-24 MED ORDER — LEVALBUTEROL HCL 0.63 MG/3ML IN NEBU: 0.6300 mg | INHALATION_SOLUTION | Freq: Four times a day (QID) | RESPIRATORY_TRACT | Status: DC | PRN

## 2023-11-24 MED ORDER — FUROSEMIDE 20 MG PO TABS: 20.0000 mg | ORAL_TABLET | Freq: Every day | ORAL | Status: DC | PRN

## 2023-11-24 MED ORDER — GABAPENTIN 400 MG PO CAPS
400.0000 mg | ORAL_CAPSULE | Freq: Two times a day (BID) | ORAL | Status: DC
  Administered 2023-11-24: 400 mg via ORAL
  Filled 2023-11-24: qty 1

## 2023-11-24 MED ORDER — OXYCODONE HCL 5 MG PO TABS
5.0000 mg | ORAL_TABLET | ORAL | 0 refills | Status: DC | PRN
  Filled 2023-11-24: qty 12, 2d supply, fill #0

## 2023-11-24 MED ORDER — TAMSULOSIN HCL 0.4 MG PO CAPS
0.4000 mg | ORAL_CAPSULE | Freq: Every day | ORAL | Status: DC
  Administered 2023-11-24: 0.4 mg via ORAL
  Filled 2023-11-24: qty 1

## 2023-11-24 NOTE — Progress Notes (Addendum)
 PHARMACY - ANTICOAGULATION CONSULT NOTE  Pharmacy Consult:  IV heparin  Indication: VTE history  Allergies  Allergen Reactions   Codeine Hives and Other (See Comments)    Takes benadryl  to stop allergic reactions    Patient Measurements: Height: 6' 1 (185.4 cm) Weight: 96.9 kg (213 lb 10 oz) IBW/kg (Calculated) : 79.9 Heparin  dosing weight = 100 kg  Vital Signs: Temp: 98.1 F (36.7 C) (02/06 0412) Temp Source: Oral (02/06 0412) BP: 121/47 (02/06 0412) Pulse Rate: 78 (02/05 2023)  Labs: Recent Labs    11/22/23 0655 11/22/23 1647 11/23/23 0329 11/23/23 0828 11/24/23 0323  HGB 13.8  --  13.2  --  13.3  HCT 42.7  --  40.5  --  42.0  PLT 269  --  255  --  204  LABPROT 13.6  --  13.5  --  14.1  INR 1.0  --  1.0  --  1.1  HEPARINUNFRC 0.80* 0.63 0.63  --  0.36  CREATININE 1.24  --   --  1.40* 1.40*    Estimated Creatinine Clearance: 53.3 mL/min (A) (by C-G formula based on SCr of 1.4 mg/dL (H)).   Assessment: 78 year old male s/p TAVR 1/21, on warfarin PTA for hx recurrent VTE.  Therapeutic anticoagulation held since 1/28 (last warfarin dose 1/26) with hematoma, hematuria and possible surgery.  Now no plans for surgery, pharmacy consulted to restart warfarin with heparin  bridge.   PTA warfarin regimen - 4 mg Sat / Sun, 2 mg all other days.  - Heparin  level therapeutic at 0.36 - INR 1.1 - have been allowing INR to gradually increase w/ hematoma - No bleeding reported overnight, hematoma stable/improved, CBC wnl - Cortrak removed 2/5, adv to soft diet but minimal PO intake; continue Ensure Enlive BID - intermittently refusing.   Goal of Therapy:  INR 2-3 Heparin  level 0.3-0.7 units/ml, no bolus Monitor platelets by anticoagulation protocol: Yes   Plan:  Patient to be discharged today on warfarin with no lovenox  bridge D/w cardiology - will give boosted dose of warfarin today to help augment INR then resume home regimen at discharge Warfarin 4mg  today prior to  d/c Continue heparin  drip at 2000 units/hr until discharge  Has appt for INR check 2/14  Prudence Dollar, PharmD, BCPS 11/24/2023 7:17 AM

## 2023-11-24 NOTE — TOC CM/SW Note (Signed)
 SATURATION QUALIFICATIONS: (This note is used to comply with regulatory documentation for home oxygen)   Patient Saturations on Room Air at Rest = 96%   Patient Saturations on Room Air while Ambulating = 86%   Patient Saturations on 1 Liters of oxygen while Ambulating = 91%   Please briefly explain why patient needs home oxygen: Pt desaturated towards the end of gait, unable to recover with breathing strategies, required 1L O2 via [place] for SpO2 >90%.    Per note documented on 2/5 by Randall SAUNDERS, PT, DPT

## 2023-11-24 NOTE — Plan of Care (Signed)

## 2023-11-24 NOTE — Discharge Summary (Signed)
 [facility]   MULTIDISCIPLINARY HEART VALVE TEAM  Discharge Summary    Patient ID: Tony Zhang MRN: [id number]; DOB: November 24, 1945  Admit date: 11/08/2023 Discharge date: 11/24/2023  Primary Care Provider: System, Provider Not In  Primary Cardiologist: Jerel Balding, MD / Dr. Wonda & Dr. 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    Chronic distal aortic occlusion (HCC)   DVT, lower extremity and pulmonary embolism, recurrent   Hyperlipidemia   COPD with acute exacerbation (HCC)   OSA (obstructive sleep apnea)   Microcytic anemia   PAD (peripheral artery disease) (HCC)   Anticoagulated on Coumadin    GIB (gastrointestinal bleeding)   AKI (acute kidney injury) (HCC)   Chronic diastolic CHF (congestive heart failure), NYHA class 2 (HCC)   Aortic stenosis, severe   CAD (coronary artery disease)   HTN (hypertension)   Acute biliary pancreatitis   Hematoma   Allergies Allergies  Allergen Reactions   Codeine Hives and Other (See Comments)    Takes benadryl  to stop allergic reactions    History of Present Illness    Tony Zhang is a 78 y.o. 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    He has a history of CAD with NSTEMI with DES placement to the LAD in 05/2022. He had 80% oRCA and heavily calcified 75% OM3. 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    He has known LFLG severe AS. A recent echocardiogram in 07/2023 for follow-up showed stable LVEF of 60 to 65%, normal RV function, no mitral valve disease, and worsening of  his low-flow low gradient aortic stenosis with a mean gradient of 32 mmHg, AVA 0.72 cm2, DVI 0.18, SVI 28, and mild AI. [facility] 09/28/23 showed severe calcific stenosis of the RCA, unchanged from the previous cath, continued patency of the stented segment in the mid LAD with moderate nonobstructive plaquing in the proximal vessel, patent left circumflex with no high-grade stenosis, patent left subclavian artery with mild calcified stenosis and moderate pulmonary hypertension.    The patient was evaluated by the multidisciplinary valve team and felt to have severe, symptomatic aortic stenosis and to be a suitable candidate for TAVR via the left subclavian approach, which was set up for 11/08/23.  Hospital Course     Consultants: internal medicine, PCCM, general surgery.    Severe AS: s/p successful TAVR with a 26 mm Edwards Sapien 3 Ultra Resilia THV via the TF approach on 11/09/23. Post operative echo showed EF 65%, normally functioning TAVR with a mean gradient of 5 mmHg and no PVL. Will discharge home on Coumadin  without a bridge. I have arranged an apt with his PCP Dr. Levander for an INR check on 2/14 @ 4pm   Subclavian seroma vs hematoma: noted at his subclavian site on 1/28. Felt to be related to full dose heparin  gtt. Seen by Dr. Lucas and interventions plans, will hopefully self resolve.   Acute gallstone pancreatitis: CT scan showed acute pancreatitis as well as gallstones. He was made NPO and required a Cortrak placement. No plans for surgery this admission. Scheduled for lap-chole  on 3/4 with Dr. Ebbie. Cortack now removed and he is tolerating a soft diet. Dr. Juvenal put eating guidelines in AVS for patient and sent in a short course of pain medication. Appreciate surgery and hospital medicine assistance.    Symptomatic hypotension: resolved with IV fluids. Likely related to acute pancreatitis.    HTN: BP improved on home Toprol  XL 100 mg daily.    Sinus tach with short runs of SVT: resolved  with resumption of home Toprol  XL 100 mg daily.    Acute on chronic HFpEF: as evidenced by an elevated LVEDP ~28 mm hg at the time of TAVR. Treated with one dose of IV lasix  40mg /KDur 20meq on 1/22. Then Lasix , Jardiance  & Toprol -XL held given AKI and hypotension. Thankfully, has not had any s/s of acute CHF. BNP 255. Will change Lasix  20mg  from daily to PRN at discharge.    AKI with baseline CKD stage IIIb: baseline creat 1.4-2.4. Creat peaked at 3.8. Likely multifactorial with contrast exposure during TAVR, hypotension and poor PO intake with acute pancreatitis. Resolved with IV fluids. Creatinine ~ 1.4 today.   Hyperkalemia: K 5.5. Resolved after 1 dose of Lokelma .    Acute urinary retention: foley removed 11/24/23. Continue Flomax .    Constipation: continue colace/miralax  PRN   Hyponatremia: NA 130 today.  Continue to follow.    CAD: s/p DES to LAD 2023. Pre op [facility] 09/28/23 showed severe calcified stenosis of the RCA unchanged from prior in 2023, patent mid LAD stent with moderate nonobstructive disease proximally, patent left circumflex, and patent subclavian with mild stenosis. Plan for medical management of CAD. Continue on atorvastatin  40 mg daily, Repatha  140mg  q 2weeks. No aspirin  given chronic OAC.    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 Continue medical management with atorvastatin  40 mg daily, Pletal  100mg  BID, Repatha  140mg  q 2weeks. No aspirin  given chronic OAC.    Chronic hypoxic respiratory failure 2/2 emphysema: he will require 1L of home 02 at discharge. I have ordered this for him.    DVT/PE: started back on Coumadin  with heparin  bridge on 11/21/23 when plans for surgery deferred to outpatient setting. Will discharge home on Coumadin  without a Lovenox  bridge. I have arranged an apt with his PCP Dr. Levander for an INR check on 2/14 @ 4pm.    Previous RLL filling defect in bronchus: possibly mucus plugging. Repeat chest CT chest was reassuring that the previously seen endobronchial lesion was benign. No follow up CT required for this.   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   Dispo: I ordered HHPT/OT and home 02. Patient ultimately refused home health services.  _____________  Discharge Vitals Blood pressure 139/64, pulse 76, temperature 98.1 F (36.7 C), temperature source Oral, resp. rate 19, height 6' 1 (1.854 m), weight 96.9 kg, SpO2 95%.  Filed Weights   11/21/23 0603 11/22/23 0432 11/23/23 0500  Weight: 98 kg 97.4 kg 96.9 kg    GEN: Well nourished, well developed, in no acute distress HEENT: normal Neck: no JVD or masses Cardiac: RRR; no murmurs, rubs, or gallops,no edema  Respiratory:  clear to auscultation bilaterally, normal work of breathing GI: soft, nontender, nondistended, + BS MS: no deformity or atrophy Skin: warm and dry, no rash, subclavian seroma with some mild ecchymosis Neuro:  Alert and Oriented x 3, Strength and sensation are intact Psych: euthymic mood, full  affect  Disposition   Pt is being discharged home today in good condition.  Follow-up Plans & Appointments     Follow-up Information     Sebastian Lamarr SAUNDERS, PA-C. Go on 12/07/2023.   Specialties: Cardiology, Radiology Why: for your echocardiogram at 2pm and office visit afterwards. Please arrive at 1:45pm Contact information: [address] [place] [address] [phone]         Ebbie Cough, MD Follow up on 12/08/2023.   Specialty: General Surgery Why: 2/20 at 1130am. Please arrive 30 minutes prior to your appointment for paperwork. Please bring a copy of your photo ID and insurance card. Contact information: [address] [place] [address] [phone]         Dr Levander (your PCP). Go on 12/02/2023.   Why: @ 4pm, please go see your PCP to have your INR  checked.               Discharge Instructions     For home use only DME oxygen   Complete by: As directed    Length of Need: Lifetime   Mode or (Route): Nasal cannula   Liters per Minute: 1   Frequency: Continuous (stationary and portable oxygen unit needed)   Oxygen conserving device: Yes       Discharge Medications   Allergies as of 11/24/2023       Reactions   Codeine Hives, Other (See Comments)   Takes benadryl  to stop allergic reactions        Medication List     STOP taking these medications    clopidogrel  75 MG tablet Commonly known as: PLAVIX    Lokelma  10 g Pack packet Generic drug: sodium zirconium cyclosilicate        TAKE these medications    acetaminophen  500 MG tablet Commonly known as: TYLENOL  Take 500 mg by mouth in the morning.   albuterol  108 (90 Base) MCG/ACT inhaler Commonly known as: VENTOLIN  HFA Inhale 2 puffs into the lungs See admin instructions. Inhale 2 puffs by mouth 2-3 times a day   allopurinol  300 MG tablet Commonly known as: ZYLOPRIM  Take 300 mg by mouth at bedtime.   atorvastatin  20 MG tablet Commonly known as: LIPITOR Take 1 tablet (20 mg total) by mouth daily.   budesonide -formoterol  160-4.5 MCG/ACT inhaler Commonly known as: Symbicort  Inhale 2 puffs into the lungs in the morning and at bedtime. What changed: when to take this   cilostazol  100 MG tablet Commonly known as: PLETAL  Take 100 mg by mouth daily.   cyanocobalamin  1000 MCG tablet Take 1 tablet (1,000 mcg total) by mouth daily.   empagliflozin  10 MG Tabs tablet Commonly known as: JARDIANCE  Take 1 tablet (10 mg total) by mouth daily.   Fenofibric Acid  135 MG Cpdr TAKE 1 TABLET BY MOUTH DAILY   furosemide  20 MG tablet Commonly known as: LASIX  Take 1 tablet (20 mg total) by mouth daily as needed. What changed:  when to take this reasons to take this   gabapentin  300 MG capsule Commonly known as: NEURONTIN  Take 1 capsule (300 mg total) by  mouth 2 (two) times daily.   gabapentin  100 MG capsule Commonly known as: NEURONTIN  Take 100 mg by mouth 2 (two) times daily.   metoprolol  succinate 100 MG 24 hr tablet Commonly known as: TOPROL -XL Take 1 tablet (100 mg total) by mouth daily.   nitroGLYCERIN  0.3 MG SL tablet Commonly known as: NITROSTAT  Place under tongue. May take every 5  minutes up to 3 doses for chest pain. If still having pain after 3 doses, call 911.   ondansetron  8 MG tablet Commonly known as: ZOFRAN  Take 8 mg by mouth 2 (two) times daily as needed for vomiting or nausea.   oxyCODONE  5 MG immediate release tablet Commonly known as: Oxy IR/ROXICODONE  Place 1 tablet (5 mg total) into feeding tube every 4 (four) hours as needed for moderate pain (pain score 4-6) or severe pain (pain score 7-10).   pyridOXINE  50 MG tablet Commonly known as: B-6 Take 1 tablet (50 mg total) by mouth daily.   Repatha  SureClick 140 MG/ML Soaj Generic drug: Evolocumab  Inject 140 mg into the skin every 14 (fourteen) days.   tamsulosin  0.4 MG Caps capsule Commonly known as: FLOMAX  Take 0.4 mg by mouth every evening.   warfarin 2 MG tablet Commonly known as: COUMADIN  Take as directed. If you are unsure how to take this medication, talk to your nurse or doctor. Original instructions: Take 2-4 mg by mouth See admin instructions. Take 2 tablets (4mg ) by mouth Saturday and Sunday, then take 1 tablet (2mg) all other days               Durable Medical Equipment  (From admission, onward)           Start     Ordered   11/24/23 0000  For home use only DME oxygen       Question Answer Comment  Length of Need Lifetime   Mode or (Route) Nasal cannula   Liters per Minute 1   Frequency Continuous (stationary and portable oxygen unit needed)   Oxygen conserving device Yes      02 /06/25 1022              Outstanding Labs/Studies   INR, BMET.   ______________________  Duration of Discharge Encounter: APP Time:  60 minutes    Signed, Lamarr Hummer, PA-C 11/24/2023, 10:46 AM [phone]

## 2023-11-24 NOTE — Progress Notes (Signed)
 Occupational Therapy Treatment Patient Details Name: Tony Zhang MRN: [id number] DOB: November 18, 1945 Today's Date: 11/24/2023   History of present illness 78 yo male adm 1/21 for TAVR. Acute course complicated by acute pancreatitis, gallstones, urinary retention and symptomatic hypotension. 1/28 Concern for hematoma at [facility]. PMHx: CAD, NSTEMI, HFrEF, PAD, DVT/PE, HTN, COPD, CKD, neuropathy, and gout.   OT comments  Patient making good gains with OT treatment with bed mobility, standing ADLs, and LB dressing. Patient ambulated in hallway with on RA with SpO2 dropping to 88% and returning to low 90's with standing rest breaks. Discharge recommendations continue to be appropriate. Acute OT to continue to follow to address established goals to facilitate DC home.       If plan is discharge home, recommend the following:  A little help with walking and/or transfers;A little help with bathing/dressing/bathroom;Assist for transportation;Help with stairs or ramp for entrance   Equipment Recommendations  None recommended by OT    Recommendations for Other Services      Precautions / Restrictions Precautions Precautions: Fall Restrictions Weight Bearing Restrictions Per Provider Order: No       Mobility Bed Mobility Overal bed mobility: Modified Independent Bed Mobility: Supine to Sit     Supine to sit: HOB elevated     General bed mobility comments: no assistance for bed mobility    Transfers Overall transfer level: Needs assistance Equipment used: Rolling walker (2 wheels) Transfers: Sit to/from Stand Sit to Stand: Supervision           General transfer comment: RW used instead of IV pole due to patient stating he felt more stable     Balance Overall balance assessment: Needs assistance Sitting-balance support: No upper extremity supported, Feet supported Sitting balance-Leahy Scale: Fair     Standing balance support: Single extremity supported, Bilateral upper extremity  supported, During functional activity Standing balance-Leahy Scale: Poor Standing balance comment: one extremity support when standing at sink                           ADL either performed or assessed with clinical judgement   ADL Overall ADL's : Needs assistance/impaired Eating/Feeding: Modified independent;Sitting Eating/Feeding Details (indicate cue type and reason): on EOB Grooming: Wash/dry hands;Wash/dry face;Brushing hair;Supervision/safety;Standing Grooming Details (indicate cue type and reason): at sink             Lower Body Dressing: Supervision/safety;Sitting/lateral leans Lower Body Dressing Details (indicate cue type and reason): able to doff and donn socks seated in recliner Toilet Transfer: Supervision/safety;Ambulation;Rolling walker (2 wheels) Toilet Transfer Details (indicate cue type and reason): simulated         Functional mobility during ADLs: Supervision/safety;Rolling walker (2 wheels) General ADL Comments: SpO2 88-93% on RA    Extremity/Trunk Assessment              Vision       Perception     Praxis      Cognition Arousal: Alert Behavior During Therapy: WFL for tasks assessed/performed Overall Cognitive Status: Within Functional Limits for tasks assessed                                 General Comments: pleasant and eager for DC from hospital        Exercises      Shoulder Instructions       General Comments SpO2 88-93% on RA  Pertinent Vitals/ Pain       Pain Assessment Pain Assessment: No/denies pain Pain Intervention(s): Monitored during session  Home Living                                          Prior Functioning/Environment              Frequency  Min 1X/week        Progress Toward Goals  OT Goals(current goals can now be found in the care plan section)  Progress towards OT goals: Progressing toward goals  Acute Rehab OT Goals Patient Stated Goal:  go home OT Goal Formulation: With patient Time For Goal Achievement: 12/05/23 Potential to Achieve Goals: Good ADL Goals Pt Will Perform Grooming: with modified independence;standing Pt Will Perform Lower Body Dressing: with modified independence;sit to/from stand Pt Will Transfer to Toilet: with modified independence;ambulating;regular height toilet Pt Will Perform Tub/Shower Transfer: Tub transfer;with supervision;rolling walker;shower seat;ambulating Additional ADL Goal #1: pt will perform 5+ mins OOB ADL to optimize activity tolerance.  Plan      Co-evaluation                 AM-PAC OT 6 Clicks Daily Activity     Outcome Measure   Help from another person eating meals?: None Help from another person taking care of personal grooming?: A Little Help from another person toileting, which includes using toliet, bedpan, or urinal?: None Help from another person bathing (including washing, rinsing, drying)?: A Little Help from another person to put on and taking off regular upper body clothing?: None Help from another person to put on and taking off regular lower body clothing?: A Little 6 Click Score: 21    End of Session Equipment Utilized During Treatment: Gait belt;Rolling walker (2 wheels)  OT Visit Diagnosis: Unsteadiness on feet (R26.81);Muscle weakness (generalized) (M62.81)   Activity Tolerance Patient tolerated treatment well   Patient Left in chair;with call bell/phone within reach   Nurse Communication Mobility status;Other (comment) (O2 sats)        Time: 9080-9057 OT Time Calculation (min): 23 min  Charges: OT General Charges $OT Visit: 1 Visit OT Treatments $Self Care/Home Management : 23-37 mins  Tony Zhang, OTA Acute Rehabilitation Services  Office [phone]   Tony Zhang 11/24/2023, 12:26 PM

## 2023-11-24 NOTE — Progress Notes (Addendum)
 Urinary catheter removed, patient tolerated well. Pt able to void 45ml in urinal. Bladder scan performed per protocol with 0ml of urine present.   Jarren Para L Solon Alban, RN

## 2023-11-24 NOTE — TOC Transition Note (Signed)
 Transition of Care [facility]) - Discharge Note Rayfield Gobble RN, BSN Transitions of Care Unit 4E- RN Case Manager See Treatment Team for direct phone #   Patient Details  Name: Tony Zhang MRN: [id number] Date of Birth: [birth year]  Transition of Care [facility]) CM/SW Contact:  Gobble Rayfield Hurst, RN Phone Number: 11/24/2023, 12:05 PM   Clinical Narrative:    Pt stable for transition home today, note pt will need home 02 per ambulatory note done by PT on 2/5. Order has been placed.  Recs for HHPT/OT- orders placed as well.   CM spoke with pt at bedside- to discuss [facility] and DME needs. Per pt he does not feel he needs HH- and declines HH services at this time. Pt voices that he drove himself to the hospital and will need to drive himself home as he has no one else to assist him, attending provider has given the ok for this.  Discussed home 02 needs- pt is agreeable but voiced that he hopes he will not need 02 long term- advised pt to follow up on 02 needs during his follow up appointments.  Choice offered for DME provider- pt states he does not have a preference (asked about small portable concentrator - explained that insurance does not cover these and he can follow should he need home 02 longterm).   Call made to [employer] liaison for home 02 needs- liaison to deliver portable 02 to the bedside prior to discharge- home equipment will be delivered to the home later today.    Final next level of care: Home/Self Care Barriers to Discharge: Barriers Resolved   Patient Goals and CMS Choice Patient states their goals for this hospitalization and ongoing recovery are:: return home CMS Medicare.gov Compare Post Acute Care list provided to:: Patient Choice offered to / list presented to : Patient      Discharge Placement               Home        Discharge Plan and Services Additional resources added to the After Visit Summary for   In-house Referral: Clinical Social Work Discharge Planning  Services: CM Consult Post Acute Care Choice: Durable Medical Equipment, Home Health          DME Arranged: Oxygen DME Agency: [facility] Date DME Agency Contacted: 11/24/23 Time DME Agency Contacted: 1145 Representative spoke with at DME Agency: [facility] HH Arranged: PT, OT, Patient Refused HH          Social Drivers of Health (SDOH) Interventions SDOH Screenings   Food Insecurity: No Food Insecurity (11/09/2023)  Housing: Low Risk  (11/09/2023)  Transportation Needs: No Transportation Needs (11/09/2023)  Utilities: Not At Risk (11/09/2023)  Alcohol Screen: Low Risk  (06/04/2022)  Financial Resource Strain: Low Risk  (06/04/2022)  Social Connections: Moderately Isolated (11/14/2023)  Tobacco Use: Medium Risk (11/08/2023)     Readmission Risk Interventions    11/24/2023   12:05 PM  Readmission Risk Prevention Plan  Transportation Screening Complete  Home Care Screening Complete  Medication Review (RN CM) Complete

## 2023-11-24 NOTE — Progress Notes (Signed)
 Discharge instructions reviewed with pt.  Copy of instructions given to pt. [facility] has filled pt's one script and will be picked up on the way out for discharge. Portable O2 tank was delivered to the pt's room and will go home with the pt. Pt getting dressed.  Pt will be d/c'd via wheelchair with belongings, with his O2 tank, and will be      escorted by staff. Pt will be driving self home, pt's MD aware, pt has had a steady gait per his primary nurse.    Alante Tolan,RN SWOT

## 2023-11-24 NOTE — Progress Notes (Signed)
 Mobility Specialist Progress Note:    11/24/23 1119  Mobility  Activity Ambulated with assistance in hallway  Level of Assistance Standby assist, set-up cues, supervision of patient - no hands on  Assistive Device Front wheel walker  Distance Ambulated (ft) 150 ft  Activity Response Tolerated well  Mobility Referral Yes  Mobility visit 1 Mobility  Mobility Specialist Start Time (ACUTE ONLY) 1105  Mobility Specialist Stop Time (ACUTE ONLY) 1115  Mobility Specialist Time Calculation (min) (ACUTE ONLY) 10 min   Pt received in chair, agreeable to ambulate in hallways with RW and CGA. Tolerated well, no audible SOB. Pre Mobility SpO2 93% on RA, post mobility SpO2 86% on RA, recovered via pursed lip breathing, SpO2 91% on RA. Left pt in care of RN, all needs met, eager for d/c.   Lataja Newland Mobility Specialist Please contact via SecureChat or  Rehab office at [phone]

## 2023-11-24 NOTE — Progress Notes (Signed)
 [facility] PROGRESS NOTE   ALECXIS Zhang FMW:[id number] DOB: [birth year] DOA: 11/08/2023  PCP: System, Provider Not In  Brief History: 78 y.o. male with past medical history of hypertension, dyslipidemia, CAD, severe aortic stenosis, PVD, history of recurrent venous thrombosis, presented for transcatheter aortic valve replacement.  Following the procedure performed on 1/21 patient reported developing severe abdominal pain.  He was found to have pancreatitis.  Then he required transfer to ICU due to hypotension.  He was stabilized and then transferred back to the floor.  Cardiology and general surgery continues to follow.  There was also concern for a subclavian site hematoma which seems to be stable.  He was seen by cardiothoracic surgery as well.       Subjective/Interval History: Eating well-- excited to go home    Assessment/Plan:  Acute biliary pancreatitis CT scan showed acute pancreatitis.  Evidence for cholelithiasis.  Patient also underwent ultrasound.  Lipase level was initially in the 600s.  Improvement noted. Underwent MRI/MRCP which showed again cholelithiasis without cholecystitis.  No choledocholithiasis noted. Seen by general surgery.  Initially plan was for cholecystectomy during this hospital stay but due to his persistent symptoms of pancreatitis this has been now deferred to March. CT scan was repeated yesterday by general surgery which continues to show mild to moderate signs of pancreatitis.  No necrosis noted. -tolerating full liquids-- advance to soft- outpatient GS follow up   Acute Respiratory Failure with Hypoxia Imaging studies showed emphysema with chronic lung changes.   Continue with nebulizer treatments and inhalers.   Continue with Flutter Valve, Incentive Spirometry, and Guaifenesin  1200 mg po BID Does not use oxygen at home.   Continue to wean down oxygen to maintain saturations greater than 90% -home O2 being arranged   Transient  Hypotension, improved In the setting of Hypovolemia after Furosemide  and Metoprolol  Administration as well as having poor Po Intake.  He is not on any blood pressure lowering agents currently.   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  Cardiology follows closely.  Acute Urinary Retention -voiding trial prior to d/c   Hematuria -Has a Hx of Prostatomegaly and foley had been placed this Admission -Worsened in the setting of AC -This was discussed with Urology Dr. Alvaro who recommended Supportive Care  Hematuria appears to have improved off of anticoagulation.  Hemoglobin has been stable.  Severe Aortic Stenosis -Status post TAVR during this admission. Cardiology following and managing.  They have resumed anticoagulation.  He is noted to be on heparin  and warfarin.   Previous RLL filling defect in bronchus -Possibly mucus plugging.    Subclavian site hematoma -Worsened in the setting of heparin  drip Seen by thoracic surgery.  No intervention planned.     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  Was taken off of anticoagulation due to hematuria and subclavian site hematoma. Anticoagulation resumed by cardiology.   No positive Doppler studies noted in our system.  His last Doppler was in 2020 which was negative for DVT.   Hypomagnesemia/hypophosphatemia/hyponatremia Continue to monitor   AKI on CKD Stage 3b, improving and stable Metabolic Acidosis, mild Renal function has improved and stable for the most part.  Monitor urine output.  Avoid nephrotoxic agents.  Hyperkalemia Noted to be 5.5 on 2/2.  Resolved with Lokelma .    Ok for d/c-- pain meds sent to [facility]  pharmacy   Objective:  Vital Signs  Vitals:   11/23/23 2023 11/23/23 2330 11/24/23 0412 11/24/23 0758  BP:  (!) 132/46 (!) 121/47 139/64  Pulse: 78   76  Resp: 15 20 20 19   Temp:  98 F (36.7 C) 98.1 F (36.7 C) 98.1 F (36.7 C)  TempSrc:  Oral Oral Oral  SpO2: 96% 95% 91% 95%  Weight:      Height:        Intake/Output Summary (Last 24 hours) at 11/24/2023 1143 Last data filed at 11/24/2023 1131 Gross per 24 hour  Intake 1104.77 ml  Output 2040 ml  Net -935.23 ml   Filed Weights   11/21/23 0603 11/22/23 0432 11/23/23 0500  Weight: 98 kg 97.4 kg 96.9 kg     General: Appearance:     Overweight male in no acute distress     Lungs:     Clear to auscultation bilaterally, respirations unlabored  Heart:    Normal heart rate. Normal rhythm.     MS:   All extremities are intact.   Neurologic:   Awake, alert     Lab Results:  Data Reviewed: I have personally reviewed following labs and reports of the imaging studies  CBC: Recent Labs  Lab 11/18/23 1228 11/21/23 0855 11/22/23 0655 11/23/23 0329 11/24/23 0323  WBC 9.8 8.7 10.8* 11.7* 10.0  HGB 14.1 15.0 13.8 13.2 13.3  HCT 42.4 46.6 42.7 40.5 42.0  MCV 94.0 95.9 94.9 95.1 98.4  PLT 308 310 269 255 204    Basic Metabolic Panel: Recent Labs  Lab 11/17/23 2009 11/18/23 1228 11/18/23 1653 11/19/23 0810 11/19/23 1659 11/20/23 0842 11/21/23 0330 11/22/23 0655 11/23/23 0828 11/24/23 0323  NA  --   --   --   --   --  135 135 133* 131* 130*  K  --   --   --   --   --  5.5* 4.6 4.9 4.6 4.6  CL  --   --   --   --   --  100 104 99 97* 100  CO2  --   --   --   --   --  26 22 24 23  18*  GLUCOSE  --   --   --   --   --  159* 122* 174* 173* 115*  BUN  --   --   --   --   --  32* 31* 33* 34* 35*  CREATININE  --   --   --   --   --  1.44* 1.15 1.24 1.40* 1.40*  CALCIUM   --   --   --   --   --  8.9 8.5* 8.6* 8.7* 8.4*  MG 1.7 1.7 1.7 1.8 1.7  --   --   --   --   --   PHOS 2.4* 2.3* 2.3* 2.9 2.4*  --   --   --   --    --     GFR: Estimated Creatinine Clearance: 53.3 mL/min (A) (by C-G formula based on SCr of 1.4 mg/dL (H)).  Liver Function Tests: Recent Labs  Lab 11/20/23 0842 11/21/23 0330  AST 36 31  ALT 28 25  ALKPHOS 63 62  BILITOT 1.7* 1.2  PROT 6.4* 6.0*  ALBUMIN 2.5* 2.6*  Recent Labs  Lab 11/21/23 0855  LIPASE 74*    Coagulation Profile: Recent Labs  Lab 11/20/23 0313 11/21/23 0330 11/22/23 0655 11/23/23 0329 11/24/23 0323  INR 1.2 1.1 1.0 1.0 1.1    CBG: Recent Labs  Lab 11/23/23 0000 11/23/23 0401 11/23/23 0801 11/23/23 1102 11/23/23 1626  GLUCAP 150* 149* 188* 158* 168*    Radiology Studies: No results found.       LOS: 16 days   Harlene RAYMOND Bowl  [facility] Pager on [email or web address]  11/24/2023, 11:43 AM

## 2023-11-26 ENCOUNTER — Other Ambulatory Visit: Payer: Self-pay | Admitting: Cardiovascular Disease

## 2023-12-07 ENCOUNTER — Ambulatory Visit: Payer: Medicare Other

## 2023-12-07 ENCOUNTER — Ambulatory Visit (HOSPITAL_COMMUNITY): Payer: Medicare Other

## 2023-12-07 ENCOUNTER — Ambulatory Visit (HOSPITAL_COMMUNITY): Payer: Medicare Other | Attending: Internal Medicine

## 2023-12-07 DIAGNOSIS — Z952 Presence of prosthetic heart valve: Secondary | ICD-10-CM | POA: Insufficient documentation

## 2023-12-07 DIAGNOSIS — I5030 Unspecified diastolic (congestive) heart failure: Secondary | ICD-10-CM | POA: Diagnosis not present

## 2023-12-07 LAB — ECHOCARDIOGRAM COMPLETE
AR max vel: 2.25 cm2
AV Area VTI: 2.59 cm2
AV Area mean vel: 2.23 cm2
AV Mean grad: 9.1 mm[Hg]
AV Peak grad: 15.8 mm[Hg]
Ao pk vel: 1.99 m/s
Area-P 1/2: 3.24 cm2
S' Lateral: 2.7 cm

## 2023-12-08 NOTE — Progress Notes (Addendum)
 [facility]   MULTIDISCIPLINARY HEART VALVE CLINIC                                     Cardiology Office Note:    Date:  12/09/2023   ID:  MAYSIN CARSTENS, DOB 30-Apr-1946, MRN [id number]  PCP:  System, Provider Not In  [facility] Cardiologist:  Thurmon Fair, MD / Dr. Excell Seltzer & Dr. Laneta Simmers (TAVR)  [facility] Electrophysiologist:  None   Referring MD: No ref. provider found   1 month s/p TAVR  History of Present Illness:    Tony Zhang is a 78 y.o. 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  He has a history of CAD with NSTEMI with DES placement to the LAD in 05/2022. He had 80% oRCA and heavily calcified 75% OM3. 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    He has known LFLG severe AS. Echocardiogram in 07/2023 showed stable LVEF of 60 to 65%, normal RV function, no mitral valve disease, and worsening of his low-flow low gradient aortic stenosis with a mean gradient of 32 mmHg, AVA 0.72 cm2, DVI 0.18, SVI 28, and mild AI. [facility] 09/28/23 showed severe calcific stenosis of the RCA, unchanged from the previous cath, continued patency of the stented segment in the mid LAD with moderate nonobstructive plaquing in the proximal vessel, patent left circumflex with no high-grade stenosis, patent left subclavian artery with mild calcified stenosis and moderate pulmonary hypertension.   He underwent successful TAVR with a 26 mm Edwards Sapien 3 Ultra Resilia THV via the TF approach on 11/09/23. Post operative echo showed EF 65%, normally functioning TAVR with a mean gradient of 5 mmHg and no PVL. Hospital course complicated by AKI 2/2 acute gallstone pancreatitis as well as a subclavian  seroma. He was noted to be hypoxic and discharged on home 02.  Today the patient presents to clinic for follow up. He is doing well. Does not note a big improvement since TAVR but has been relatively sedentary. Still has some exertional SOB with walking long distances and sweeping snow off his deck. Not wearing his oxygen. He has had some mild left shoulder pain related to the seroma and intermittent abdominal pain. He has had some weakness in the legs and knees. He is looking forward to the next antique festival in April and wants to get stronger before this. No chest pain. No dizziness or syncope.    Past Medical History:  Diagnosis Date   AAA (abdominal aortic aneurysm) (HCC)    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     CAD (coronary artery disease)    COPD (chronic obstructive pulmonary disease) (HCC)    DJD (degenerative joint disease)    Dyslipidemia    Gout    Malignant hypertension    Peripheral vascular disease (HCC)    Pneumonia 08/18/2013   pt. reports that he is having this surgery 11/25/2014- for the lung problem that began with pneumonia in 09/2014   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   s/p  TAVR with a 26 mm Edwards S3UR via the left subclavian approach by Dr. Excell Seltzer and Dr. Laneta Simmers   Severe aortic stenosis    Venous thrombosis    Recurrent     Current Medications: Current Meds  Medication Sig   acetaminophen (TYLENOL) 500 MG tablet Take 500 mg by mouth in the morning.   albuterol (VENTOLIN HFA) 108 (90 Base) MCG/ACT inhaler Inhale 2 puffs into the lungs See admin instructions. Inhale 2 puffs by mouth 2-3 times a day   allopurinol (ZYLOPRIM) 300 MG tablet Take 300 mg by mouth at bedtime.   atorvastatin (LIPITOR) 20 MG tablet Take 1 tablet (20 mg total) by mouth daily.   budesonide-formoterol (SYMBICORT) 160-4.5 MCG/ACT inhaler Inhale 2 puffs into the lungs in the morning and at  bedtime. (Patient taking differently: Inhale 2 puffs into the lungs daily.)   Choline Fenofibrate (FENOFIBRIC ACID) 135 MG CPDR TAKE 1 TABLET BY MOUTH DAILY   cilostazol (PLETAL) 100 MG tablet Take 100 mg by mouth daily.   cyanocobalamin 1000 MCG tablet Take 1 tablet (1,000 mcg total) by mouth daily.   empagliflozin (JARDIANCE) 10 MG TABS tablet Take 1 tablet (10 mg total) by mouth daily.   gabapentin (NEURONTIN) 600 MG tablet Take 600 mg by mouth 3 (three) times daily.   metoprolol succinate (TOPROL-XL) 100 MG 24 hr tablet Take 1 tablet (100 mg total) by mouth daily.   nitroGLYCERIN (NITROSTAT) 0.3 MG SL tablet Place under tongue. May take every 5 minutes up to 3 doses for chest pain. If still having pain after 3 doses, call 911.   ondansetron (ZOFRAN) 8 MG tablet Take 8 mg by mouth 2 (two) times daily as needed for vomiting or nausea.   oxyCODONE (OXY IR/ROXICODONE) 5 MG immediate release tablet Place 1 tablet (5 mg total) into feeding tube every 4 (four) hours as needed for moderate pain (pain score 4-6) or severe pain (pain score 7-10).   pyridOXINE (B-6) 50 MG tablet Take 1 tablet (50 mg total) by mouth daily.   REPATHA SURECLICK 140 MG/ML SOAJ INJECT 140 MGS SUBCUTANEOUSLY EVERY 14 DAYS   tamsulosin (FLOMAX) 0.4 MG CAPS capsule Take 0.4 mg by mouth every evening.   warfarin (COUMADIN) 2 MG tablet Take 2-4 mg by mouth See admin instructions. Take 2 tablets (4mg ) by mouth Saturday and Sunday, then take 1 tablet (2mg ) all other days   [DISCONTINUED] furosemide (LASIX) 20 MG tablet Take 1 tablet (20 mg total) by mouth daily as needed.      ROS:   Please see the history of present illness.    All other systems reviewed and are negative.  EKGs       Risk Assessment/Calculations:           Physical Exam:    VS:  BP 126/76 (BP Location: Left Arm, Patient Position: Sitting, Cuff Size: Large)   Pulse 75   Resp 16   Ht 6\' 1"  (1.854 m)   Wt 220 lb (99.8 kg)   SpO2 91%   BMI 29.03  kg/m     Wt Readings from Last 3 Encounters:  12/09/23 220 lb (99.8 kg)  11/23/23 213 lb 10 oz (96.9 kg)  10/27/23 230 lb (104.3 kg)     GEN: Well nourished, well developed in no acute distress NECK: No JVD CARDIAC: RRR, no murmurs, rubs, gallops Subclavian site healing well with resolution of seroma RESPIRATORY:  Clear to auscultation without rales, wheezing or rhonchi  ABDOMEN: Soft, non-tender, non-distended EXTREMITIES:  No edema; No deformity.    ASSESSMENT:    1. S/P TAVR (transcatheter aortic valve replacement)   2. Posttraumatic seroma (HCC)   3. Gallstone pancreatitis   4. Essential hypertension   5. SVT (supraventricular tachycardia) (HCC)   6. Chronic heart failure with preserved ejection fraction (HCC)   7. Coronary artery disease involving native coronary artery of native heart without angina pectoris   8. Chronic obstructive pulmonary disease, unspecified COPD type (HCC)   9. History of pulmonary embolism   10. Aneurysm of ascending aorta without rupture (HCC)     PLAN:    In order of problems listed above:  Severe AS s/p TAVR: -- Echo 12/07/23 showed EF 60-65%, mod LVH, mild RVE, normally functioning TAVR with a mean gradient of 9.1 mmHg and no PVL as well as moderate MAC.  -- NYHA class II symptoms. I think dyspnea is related to lung disease and deconditioning. -- Continue Coumadin.  -- I will see back for 1 year office visit with echo.   Subclavian seroma:  -- Now resolved.  -- He does have some pain in left shoulder likely related to nerve irritation. -- Continue gabapentin    Acute gallstone pancreatitis:  -- Scheduled for lap-chole on 3/4 with Dr. Dwain Sarna. He is CLEARED for surgery from a cardiology perspective. -- He has been instructed to hold Coumadin for 5 days prior to surgery with a last dose on 12/14/23. -- He was not bridged with Lovenox prior to cath or TAVR and did well without any thromboembolic events.     HTN: -- BP well controlled  today.   -- Continue Toprol XL 100 mg daily.    SVT:  -- Continue Toprol XL 100 mg daily.    HFpEF:  -- Continue Lasix 20mg  daily.    CKD stage IIIb:  -- Baseline creat 1.4-2.4.  -- I wanted to check a BMET today but he refused. He said his PCP just checked it and he will have repeat labs in anticipation of surgery.  CAD:  -- s/p DES to LAD 2023.  -- Pre op [facility] 09/28/23 showed severe calcified stenosis of the RCA unchanged from prior in 2023, patent mid LAD stent with moderate nonobstructive disease proximally, patent left circumflex, and patent subclavian with mild stenosis.  -- Plan for medical management of CAD.  -- Continue on atorvastatin 40 mg daily, Repatha 140mg  q 2weeks. -- No aspirin given chronic OAC.    PAD:  -- Extensive PAD with known occlusion of the distal abdominal aorta, renal artery stenosis, celiac/mesenteric artery stenosis, and total RICA occlusion and 40-59% LICA occlusion.  -- Continue medical management with atorvastatin 40 mg daily, Pletal 100mg  BID, Repatha 140mg  q 2weeks.  -- No aspirin given chronic OAC.    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    DVT/PE:  -- Continue Coumadin. INR followed by PCP, Dr. Rosalia Hammers.   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  Concerns regarding medicines are outlined above.  No orders of the defined types were placed in this encounter.  Meds ordered this encounter  Medications   furosemide (LASIX) 20 MG tablet    Sig: Take 1 tablet (20 mg total) by mouth daily.    Patient Instructions  Medication Instructions:  Your physician has recommended you make the following change in your medication:  Hold coumadin for 5 days prior to procedure. You last dose will be on Wednesday, Feb 26th. Increase Lasix from "as needed" to once daily.    Lab Work: None ordered.  If you have labs (blood work) drawn today and your tests are completely normal, you will receive your results only by: MyChart Message (if you have MyChart) OR A paper copy in the mail If you have any lab test that is abnormal or we need to change your treatment, we will call you to review the results.  Testing/Procedures: None ordered.  Follow-Up: At [facility], you and your health needs are our priority.  As part of our continuing mission to provide you with exceptional heart care, we have created designated Provider Care Teams.  These Care Teams include your primary Cardiologist (physician) and Advanced Practice Providers (APPs -  Physician Assistants and Nurse Practitioners) who all work together to provide you with the care you need, when you need it.  We recommend signing up for the patient portal called "MyChart".  Sign up information is provided on this After Visit Summary.  MyChart is used to connect with patients for Virtual Visits (Telemedicine).  Patients are able to view lab/test results, encounter notes, upcoming appointments, etc.  Non-urgent messages can be sent to your provider as well.   To learn more about what you can do with MyChart, go to [email or web address].    Your next appointment:   As scheduled  The format for your next appointment:   In Person   Important Information About Sugar         Signed, Cline Crock, PA-C  12/09/2023 9:42 AM    [facility]

## 2023-12-09 ENCOUNTER — Ambulatory Visit: Payer: Medicare Other | Attending: Physician Assistant | Admitting: Physician Assistant

## 2023-12-09 VITALS — BP 126/76 | HR 75 | Resp 16 | Ht 73.0 in | Wt 220.0 lb

## 2023-12-09 DIAGNOSIS — I471 Supraventricular tachycardia, unspecified: Secondary | ICD-10-CM

## 2023-12-09 DIAGNOSIS — I10 Essential (primary) hypertension: Secondary | ICD-10-CM | POA: Diagnosis not present

## 2023-12-09 DIAGNOSIS — Z952 Presence of prosthetic heart valve: Secondary | ICD-10-CM | POA: Diagnosis not present

## 2023-12-09 DIAGNOSIS — T792XXD Traumatic secondary and recurrent hemorrhage and seroma, subsequent encounter: Secondary | ICD-10-CM

## 2023-12-09 DIAGNOSIS — I7121 Aneurysm of the ascending aorta, without rupture: Secondary | ICD-10-CM

## 2023-12-09 DIAGNOSIS — T792XXA Traumatic secondary and recurrent hemorrhage and seroma, initial encounter: Secondary | ICD-10-CM

## 2023-12-09 DIAGNOSIS — K851 Biliary acute pancreatitis without necrosis or infection: Secondary | ICD-10-CM

## 2023-12-09 DIAGNOSIS — I2510 Atherosclerotic heart disease of native coronary artery without angina pectoris: Secondary | ICD-10-CM

## 2023-12-09 DIAGNOSIS — Z86711 Personal history of pulmonary embolism: Secondary | ICD-10-CM

## 2023-12-09 DIAGNOSIS — J449 Chronic obstructive pulmonary disease, unspecified: Secondary | ICD-10-CM

## 2023-12-09 DIAGNOSIS — I5032 Chronic diastolic (congestive) heart failure: Secondary | ICD-10-CM

## 2023-12-09 MED ORDER — FUROSEMIDE 20 MG PO TABS: 20.0000 mg | ORAL_TABLET | Freq: Every day | ORAL | Status: DC

## 2023-12-09 NOTE — Patient Instructions (Addendum)
Medication Instructions:  Your physician has recommended you make the following change in your medication:  Hold coumadin for 5 days prior to procedure. You last dose will be on Wednesday, Feb 26th. Increase Lasix from "as needed" to once daily.   Lab Work: None ordered.  If you have labs (blood work) drawn today and your tests are completely normal, you will receive your results only by: MyChart Message (if you have MyChart) OR A paper copy in the mail If you have any lab test that is abnormal or we need to change your treatment, we will call you to review the results.  Testing/Procedures: None ordered.  Follow-Up: At [facility], you and your health needs are our priority.  As part of our continuing mission to provide you with exceptional heart care, we have created designated Provider Care Teams.  These Care Teams include your primary Cardiologist (physician) and Advanced Practice Providers (APPs -  Physician Assistants and Nurse Practitioners) who all work together to provide you with the care you need, when you need it.  We recommend signing up for the patient portal called "MyChart".  Sign up information is provided on this After Visit Summary.  MyChart is used to connect with patients for Virtual Visits (Telemedicine).  Patients are able to view lab/test results, encounter notes, upcoming appointments, etc.  Non-urgent messages can be sent to your provider as well.   To learn more about what you can do with MyChart, go to [email or web address].    Your next appointment:   As scheduled  The format for your next appointment:   In Person   Important Information About Sugar

## 2024-01-09 NOTE — Progress Notes (Signed)
 Surgical Instructions   Your procedure is scheduled on Tuesday, January 17, 2024. Report to [facility] Main Entrance "A" at 5:30 A.M., then check in with the Admitting office. Any questions or running late day of surgery: call [phone]  Questions prior to your surgery date: call [phone], Monday-Friday, 8am-4pm. If you experience any cold or flu symptoms such as cough, fever, chills, shortness of breath, etc. between now and your scheduled surgery, please notify us at the above number.     Remember:  Do not eat after midnight the night before your surgery   You may drink clear liquids until 4:30 the morning of your surgery.   Clear liquids allowed are: Water, Non-Citrus Juices (without pulp), Carbonated Beverages, Clear Tea (no milk, honey, etc.), Black Coffee Only (NO MILK, CREAM OR POWDERED CREAMER of any kind), and Gatorade.  Patient Instructions  The night before surgery:  No food after midnight. ONLY clear liquids after midnight   The day of surgery (if you have diabetes): Drink ONE (1) 12 oz G2 given to you in your pre admission testing appointment by 4:30 the morning of surgery. Drink in one sitting. Do not sip.  This drink was given to you during your hospital pre-op appointment visit.  Nothing else to drink after completing the 12 oz bottle of G2.         If you have questions, please contact your surgeon's office.    Take these medicines the morning of surgery with A SIP OF WATER  atorvastatin (LIPITOR)  budesonide-formoterol (SYMBICORT) gabapentin (NEURONTIN)   metoprolol succinate (TOPROL-XL)    May take these medicines IF NEEDED: acetaminophen (TYLENOL)  albuterol (VENTOLIN HFA) inhaler - bring to the hospital with you on day of surgery nitroGLYCERIN (NITROSTAT)  ondansetron (ZOFRAN)  oxyCODONE (OXY IR/ROXICODONE)   Hold your warfarin (COUMADIN) for 5 days prior to surgery.  Your last dose should be taken on Wednesday, 01/11/24.  Follow your physician's  instruction's on when to stop taking cilostazol (PLETAL). If you have not received instructions, please contact your physician's office.   One week prior to surgery, STOP taking any Aspirin (unless otherwise instructed by your surgeon) Aleve, Naproxen, Ibuprofen, Motrin, Advil, Goody's, BC's, all herbal medications, fish oil, and non-prescription vitamins.                      WHAT DO I DO ABOUT MY DIABETES MEDICATION?   Do not take oral empagliflozin (JARDIANCE) for 3 days prior to surgery, per your physician.  Your last dos should be taken on Friday, 01/13/24.     HOW TO MANAGE YOUR DIABETES BEFORE AND AFTER SURGERY  Why is it important to control my blood sugar before and after surgery? Improving blood sugar levels before and after surgery helps healing and can limit problems. A way of improving blood sugar control is eating a healthy diet by:  Eating less sugar and carbohydrates  Increasing activity/exercise  Talking with your doctor about reaching your blood sugar goals High blood sugars (greater than 180 mg/dL) can raise your risk of infections and slow your recovery, so you will need to focus on controlling your diabetes during the weeks before surgery. Make sure that the doctor who takes care of your diabetes knows about your planned surgery including the date and location.  How do I manage my blood sugar before surgery? Check your blood sugar at least 4 times a day, starting 2 days before surgery, to make sure that the level is  not too high or low.  Check your blood sugar the morning of your surgery when you wake up and every 2 hours until you get to the Short Stay unit.  If your blood sugar is less than 70 mg/dL, you will need to treat for low blood sugar: Do not take insulin. Treat a low blood sugar (less than 70 mg/dL) with  cup of clear juice (cranberry or apple), 4 glucose tablets, OR glucose gel. Recheck blood sugar in 15 minutes after treatment (to make sure it is  greater than 70 mg/dL). If your blood sugar is not greater than 70 mg/dL on recheck, call [phone] for further instructions. Report your blood sugar to the short stay nurse when you get to Short Stay.  If you are admitted to the hospital after surgery: Your blood sugar will be checked by the staff and you will probably be given insulin after surgery (instead of oral diabetes medicines) to make sure you have good blood sugar levels. The goal for blood sugar control after surgery is 80-180 mg/dL.  Do NOT Smoke (Tobacco/Vaping) for 24 hours prior to your procedure.  If you use a CPAP at night, you may bring your mask/headgear for your overnight stay.   You will be asked to remove any contacts, glasses, piercing's, hearing aid's, dentures/partials prior to surgery. Please bring cases for these items if needed.    Patients discharged the day of surgery will not be allowed to drive home, and someone needs to stay with them for 24 hours.  SURGICAL WAITING ROOM VISITATION Patients may have no more than 2 support people in the waiting area - these visitors may rotate.   Pre-op nurse will coordinate an appropriate time for 1 ADULT support person, who may not rotate, to accompany patient in pre-op.  Children under the age of 68 must have an adult with them who is not the patient and must remain in the main waiting area with an adult.  If the patient needs to stay at the hospital during part of their recovery, the visitor guidelines for inpatient rooms apply.  Please refer to the [facility] website for the visitor guidelines for any additional information.   If you received a COVID test during your pre-op visit  it is requested that you wear a mask when out in public, stay away from anyone that may not be feeling well and notify your surgeon if you develop symptoms. If you have been in contact with anyone that has tested positive in the last 10 days please notify you surgeon.       Pre-operative CHG Bathing Instructions   You can play a key role in reducing the risk of infection after surgery. Your skin needs to be as free of germs as possible. You can reduce the number of germs on your skin by washing with CHG (chlorhexidine gluconate) soap before surgery. CHG is an antiseptic soap that kills germs and continues to kill germs even after washing.   DO NOT use if you have an allergy to chlorhexidine/CHG or antibacterial soaps. If your skin becomes reddened or irritated, stop using the CHG and notify one of our RNs at [phone].              TAKE A SHOWER THE NIGHT BEFORE SURGERY AND THE DAY OF SURGERY    Please keep in mind the following:  You may shave your face before/day of surgery.  Place clean sheets on your bed the night before surgery Use  a clean washcloth (not used since being washed) for each shower. DO NOT sleep with pet's night before surgery.  CHG Shower Instructions:  Wash your face and private area with normal soap. If you choose to wash your hair, wash first with your normal shampoo.  After you use shampoo/soap, rinse your hair and body thoroughly to remove shampoo/soap residue.  Turn the water OFF and apply half the bottle of CHG soap to a CLEAN washcloth.  Apply CHG soap ONLY FROM YOUR NECK DOWN TO YOUR TOES (washing for 3-5 minutes)  DO NOT use CHG soap on face, private areas, open wounds, or sores.  Pay special attention to the area where your surgery is being performed.  If you are having back surgery, having someone wash your back for you may be helpful. Wait 2 minutes after CHG soap is applied, then you may rinse off the CHG soap.  Pat dry with a clean towel  Put on clean pajamas    Additional instructions for the day of surgery: DO NOT APPLY any lotions, deodorants, or cologne.   Do not wear jewelry or makeup Do not wear nail polish, gel polish, artificial nails, or any other type of covering on natural nails (fingers and  toes) Do not bring valuables to the hospital. [facility] is not responsible for valuables/personal belongings. Put on clean/comfortable clothes.  Please brush your teeth.  Ask your nurse before applying any prescription medications to the skin.

## 2024-01-10 ENCOUNTER — Encounter (HOSPITAL_COMMUNITY): Payer: Self-pay

## 2024-01-10 ENCOUNTER — Encounter (HOSPITAL_COMMUNITY)
Admission: RE | Admit: 2024-01-10 | Discharge: 2024-01-10 | Disposition: A | Discharge status: Home / Self Care | Source: Ambulatory Visit | Attending: General Surgery | Admitting: General Surgery

## 2024-01-10 ENCOUNTER — Other Ambulatory Visit: Payer: Self-pay

## 2024-01-10 VITALS — BP 174/81 | HR 80 | Temp 98.3°F | Resp 18 | Ht 73.0 in | Wt 217.5 lb

## 2024-01-10 DIAGNOSIS — I2510 Atherosclerotic heart disease of native coronary artery without angina pectoris: Secondary | ICD-10-CM | POA: Insufficient documentation

## 2024-01-10 DIAGNOSIS — Z01812 Encounter for preprocedural laboratory examination: Secondary | ICD-10-CM | POA: Diagnosis not present

## 2024-01-10 DIAGNOSIS — E119 Type 2 diabetes mellitus without complications: Secondary | ICD-10-CM | POA: Insufficient documentation

## 2024-01-10 DIAGNOSIS — Z01818 Encounter for other preprocedural examination: Secondary | ICD-10-CM | POA: Diagnosis present

## 2024-01-10 DIAGNOSIS — Z955 Presence of coronary angioplasty implant and graft: Secondary | ICD-10-CM | POA: Diagnosis not present

## 2024-01-10 DIAGNOSIS — R7303 Prediabetes: Secondary | ICD-10-CM

## 2024-01-10 HISTORY — DX: Dyspnea, unspecified: R06.00

## 2024-01-10 HISTORY — DX: Type 2 diabetes mellitus without complications: E11.9

## 2024-01-10 HISTORY — DX: Chronic kidney disease, unspecified: N18.9

## 2024-01-10 LAB — CBC
HCT: 48.7 % (ref 39.0–52.0)
Hemoglobin: 15.1 g/dL (ref 13.0–17.0)
MCH: 30.1 pg (ref 26.0–34.0)
MCHC: 31 g/dL (ref 30.0–36.0)
MCV: 97 fL (ref 80.0–100.0)
Platelets: 310 10*3/uL (ref 150–400)
RBC: 5.02 MIL/uL (ref 4.22–5.81)
RDW: 17 % — ABNORMAL HIGH (ref 11.5–15.5)
WBC: 8.9 10*3/uL (ref 4.0–10.5)
nRBC: 0 % (ref 0.0–0.2)

## 2024-01-10 LAB — HEMOGLOBIN A1C
Hgb A1c MFr Bld: 5.4 % (ref 4.8–5.6)
Mean Plasma Glucose: 108.28 mg/dL

## 2024-01-10 LAB — BASIC METABOLIC PANEL
Anion gap: 10 (ref 5–15)
BUN: 19 mg/dL (ref 8–23)
CO2: 21 mmol/L — ABNORMAL LOW (ref 22–32)
Calcium: 9.1 mg/dL (ref 8.9–10.3)
Chloride: 106 mmol/L (ref 98–111)
Creatinine, Ser: 1.58 mg/dL — ABNORMAL HIGH (ref 0.61–1.24)
GFR, Estimated: 44 mL/min — ABNORMAL LOW (ref >60)
Glucose, Bld: 108 mg/dL — ABNORMAL HIGH (ref 70–99)
Potassium: 5.6 mmol/L — ABNORMAL HIGH (ref 3.5–5.1)
Sodium: 137 mmol/L (ref 135–145)

## 2024-01-10 LAB — GLUCOSE, CAPILLARY: Glucose-Capillary: 99 mg/dL (ref 70–99)

## 2024-01-10 NOTE — Progress Notes (Signed)
 Pt's K is 5.6 at PAT visit. Per Fayrene Fearing, [place] , pt will need an ISTAT on DOS

## 2024-01-10 NOTE — Progress Notes (Signed)
 PCP - Leane Call., PA-C   Cardiologist - Croitoru, Rachelle Hora, MD   PPM/ICD - denies Device Orders - n/a Rep Notified - n/a  Chest x-ray - 11/16/2023 EKG - 11/10/2023 Stress Test - 11/17/2007 ECHO - 12/07/2023 Cardiac Cath - 08/29/2023  Sleep Study - 12/15/2007 on the record (per pt he never had OSA) CPAP - no  Fasting Blood Sugar - 99, hx of DM (per pt he doesn't have DM, A1c 5.5 from 02/2023) Checks Blood Sugar 0 times a day  Last dose of GLP1 agonist-  n/a GLP1 instructions: n/a  Blood Thinner Instructions: hold Warfarin 5 days prior to sx; LD - 01/11/2024 Aspirin Instructions: n/a  ERAS Protcol - till 0430 am PRE-SURGERY Ensure or G2- G2  COVID TEST- no; per pt he had COVID last month.  Anesthesia review: yes, cardiac clearance on 12/09/2023. Per pt completed Lagevrio for COVID about 3 weeks ago; no fever; still has some cough and SOB after long walks; pt has O2 (1 L) at home; per pt he stopped using O2 about a week ago. Fayrene Fearing, [place], assessing the pt. Pt's BP 174/81. Per pt he just had a long walk to PAT office and he is not sure if he took his Metoprolol today yet. Pt is educated on the importance of taking his medications. Per pt he doesn't have a blood pressure cuff at home and he will not check his BP.     Patient denies shortness of breath, fever and chest pain at PAT appointment   All instructions explained to the patient, with a verbal understanding of the material. Patient agrees to go over the instructions while at home for a better understanding. Patient also instructed to self quarantine after being tested for COVID-19. The opportunity to ask questions was provided.

## 2024-01-10 NOTE — Progress Notes (Signed)
 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 Pre op [facility] 09/28/23 showed severe calcified stenosis of the RCA unchanged from prior in 2023, patent mid LAD stent with moderate nonobstructive disease proximally, patent left circumflex, and patent subclavian with mild stenosis.   Echo 12/07/2023 showed EF 60 to 65%, moderate LVH, mild RVE, normally functioning TAVR with a mean gradient of 9.1 mmHg and no PVL as well as moderate MAC.  Post TAVR hospital course was complicated by AKI 2/2 acute gallstone pancreatitis as well as a subclavian seroma.  He was last seen in outpatient follow-up by Cline Crock, PA-C on 12/09/2023, stable at that time, noted chronic DOE likely related to lung disease and deconditioning.  Upcoming surgery was also discussed.  Per note, "Scheduled for lap-chole on 3/4 with Dr. Dwain Sarna. He is CLEARED for surgery from a cardiology perspective. He has been instructed to hold Coumadin for 5 days prior to surgery with a last dose on 12/14/23. He was not bridged with Lovenox prior to cath or TAVR and did well without any thromboembolic events."  I spoke with patient at his preadmission testing appointment due to report of cough.  He states he had Covid in February, treated outpatient with Lagevrio. Lingering cough, minimally productive, no other symptoms. Former smoker with underlying COPD/Emphysema.  He is prescribed home oxygen but states he rarely uses it.  Uses Symbicort daily. Also states he uses albuterol 3x daily when he takes his PO meds. States this is just what he has done, not necessarily using it for symptoms of SOB. Overall he says is DOE is essentially at baseline,  he is just bothered by persistent cough after covid.  He was short of breath after walking from the parking deck to the front entrance of the hospital, but states this is normal for him.  On exam he appears well, no SOB at rest or with conversation. Lungs CTAB. Mild cough during interview.  Discussed likely simple postviral cough, however, if he finds it particularly bothersome he should discuss with his PCP.  I do not anticipate any issue proceeding with surgery.  History of CKD 3B.  Patient reports last dose of warfarin 01/11/2024.  Patient did not have instructions for Pletal.  I reached out to Dr. Doreen Salvage office and advised them to call patient with instructions.  Preop labs reviewed, potassium elevated at 5.6, creatinine elevated 1.58 consistent with history of CKD, otherwise unremarkable.  Will repeat i-STAT day of surgery due to hyperkalemia.  EKG 11/10/2023: Normal sinus rhythm.  Rate 86. Left axis deviation. Inferior infarct , age undetermined. Anteroseptal infarct , age undetermined  TTE 12/07/2023: 1. Left ventricular ejection fraction, by estimation, is 60 to 65%. The  left ventricle has normal function. The left ventricle has no regional  wall motion abnormalities. There is moderate left ventricular hypertrophy.  Left ventricular diastolic  parameters are consistent with Grade I diastolic dysfunction (impaired  relaxation).   2. Right ventricular systolic function is normal. The right ventricular  size is mildly enlarged. There is mildly elevated pulmonary artery  systolic pressure. The estimated right ventricular systolic pressure is  37.1 mmHg.   3. The mitral valve is degenerative. Trivial mitral valve regurgitation.  No evidence of mitral  stenosis. Moderate mitral annular calcification.   4. The aortic valve has been repaired/replaced. Aortic valve  regurgitation is not visualized. No aortic stenosis is present. There is a  26 mm Sapien prosthetic (TAVR) valve present in  the aortic position.  Procedure Date: 11/08/2023. Echo findings are  consistent with normal structure and function of the aortic valve  prosthesis. Aortic valve area, by VTI measures 2.59 cm. Aortic valve mean  gradient measures 9.1 mmHg. Aortic valve Vmax measures 1.99 m/s. Aortic  valve acceleration time measures 53 msec.   5. The inferior vena cava is normal in size with greater than 50%  respiratory variability, suggesting right atrial pressure of 3 mmHg.   R/LHC 09/28/2023:    Ost LAD to Mid LAD lesion is 50% stenosed.   Ost RCA to Prox RCA lesion is 80% stenosed.   Prox RCA lesion is 25% stenosed.   Mid RCA lesion is 70% stenosed.   1st Diag lesion is 50% stenosed.   3rd Mrg lesion is 75% stenosed.   Dist RCA lesion is 50% stenosed.   Non-stenotic Mid LAD lesion was previously treated.   Hemodynamic findings consistent with moderate pulmonary hypertension.   1.  Severe calcific stenosis of the RCA, unchanged from the previous cardiac catheterization procedure 2.  Continued patency of the stented segment in the mid LAD with moderate nonobstructive plaquing in the proximal vessel 3.  Patent left circumflex with no high-grade stenosis 4.  Patent left subclavian artery with mild calcified stenosis 5.  Calcified aortic valve leaflets with known severe aortic stenosis by noninvasive assessment 6.  Moderate pulmonary hypertension with mean PA pressure 39 mmHg, transpulmonary gradient 20 mmHg, PVR 4.26 Wood units   Recommendations: Continue TAVR evaluation.  Medical therapy for CAD.  Hydration with repeat labs in the morning, overnight observation.    Zannie Cove [facility] Short Stay Center/Anesthesiology Phone [phone] 01/11/2024 12:06 PM

## 2024-01-10 NOTE — Progress Notes (Signed)
 Surgical Instructions     Your procedure is scheduled on Tuesday, January 17, 2024. Report to [facility] Main Entrance "A" at 5:30 A.M., then check in with the Admitting office. Any questions or running late day of surgery: call [phone]   Questions prior to your surgery date: call [phone], Monday-Friday, 8am-4pm. If you experience any cold or flu symptoms such as cough, fever, chills, shortness of breath, etc. between now and your scheduled surgery, please notify us at the above number.            Remember:       Do not eat after midnight the night before your surgery     You may drink clear liquids until 4:30 the morning of your surgery.   Clear liquids allowed are: Water, Non-Citrus Juices (without pulp), Carbonated Beverages, Clear Tea (no milk, honey, etc.), Black Coffee Only (NO MILK, CREAM OR POWDERED CREAMER of any kind), and Gatorade. Patient Instructions  The night before surgery:  No food after midnight. ONLY clear liquids after midnight  The day of surgery (if you have diabetes): Drink ONE (1) 12 oz G2 given to you in your pre admission testing appointment by 0430 the morning of surgery. Drink in one sitting. Do not sip.  This drink was given to you during your hospital  pre-op appointment visit.  Nothing else to drink after completing the  12 oz bottle of G2.         If you have questions, please contact your surgeon's office.  Take these medicines the morning of surgery with A SIP OF WATER  atorvastatin (LIPITOR)  budesonide-formoterol (SYMBICORT) gabapentin (NEURONTIN)   metoprolol succinate (TOPROL-XL)      May take these medicines IF NEEDED: acetaminophen (TYLENOL)  albuterol (VENTOLIN HFA) inhaler - bring to the hospital with you on day of surgery nitroGLYCERIN (NITROSTAT)  ondansetron (ZOFRAN)  oxyCODONE (OXY IR/ROXICODONE)    Hold your warfarin (COUMADIN) for 5 days prior to surgery.  Your last dose should be taken on Wednesday, 01/11/24.    Follow your surgeon's instructions on when to stop taking cilostazol (PLETAL). If you have not received instructions, please contact your physician's office.     One week prior to surgery, STOP taking any Aspirin (unless otherwise instructed by your surgeon) Aleve, Naproxen, Ibuprofen, Motrin, Advil, Goody's, BC's, all herbal medications, fish oil, and non-prescription vitamins.                      WHAT DO I DO ABOUT MY DIABETES MEDICATION?     Do not take oral empagliflozin (JARDIANCE) for 3 days prior to surgery, per your physician.  Your last dos should be taken on Friday, 01/13/24.       HOW TO MANAGE YOUR DIABETES BEFORE AND AFTER SURGERY   Why is it important to control my blood sugar before and after surgery? Improving blood sugar levels before and after surgery helps healing and can limit problems. A way of improving blood sugar control is eating a healthy diet by:  Eating less sugar and carbohydrates  Increasing activity/exercise  Talking with your doctor about reaching your blood sugar goals High blood sugars (greater than 180 mg/dL) can raise your risk of infections and slow your recovery, so you will need to focus on controlling your diabetes during the weeks before surgery. Make sure that the doctor who takes care of your diabetes knows about your planned surgery including the date and location.   How do I manage  my blood sugar before surgery? Check your blood sugar at least 4 times a day, starting 2 days before surgery, to make sure that the level is not too high or low.   Check your blood sugar the morning of your surgery when you wake up and every 2 hours until you get to the Short Stay unit.   If your blood sugar is less than 70 mg/dL, you will need to treat for low blood sugar: Do not take insulin. Treat a low blood sugar (less than 70 mg/dL) with  cup of clear juice (cranberry or apple), 4 glucose tablets, OR glucose gel. Recheck blood sugar in 15 minutes after  treatment (to make sure it is greater than 70 mg/dL). If your blood sugar is not greater than 70 mg/dL on recheck, call [phone] for further instructions. Report your blood sugar to the short stay nurse when you get to Short Stay.   If you are admitted to the hospital after surgery: Your blood sugar will be checked by the staff and you will probably be given insulin after surgery (instead of oral diabetes medicines) to make sure you have good blood sugar levels. The goal for blood sugar control after surgery is 80-180 mg/dL.   Do NOT Smoke (Tobacco/Vaping) for 24 hours prior to your procedure.   If you use a CPAP at night, you may bring your mask/headgear for your overnight stay.   You will be asked to remove any contacts, glasses, piercing's, hearing aid's, dentures/partials prior to surgery. Please bring cases for these items if needed.    Patients discharged the day of surgery will not be allowed to drive home, and someone needs to stay with them for 24 hours.   SURGICAL WAITING ROOM VISITATION Patients may have no more than 2 support people in the waiting area - these visitors may rotate.   Pre-op nurse will coordinate an appropriate time for 1 ADULT support person, who may not rotate, to accompany patient in pre-op.  Children under the age of 46 must have an adult with them who is not the patient and must remain in the main waiting area with an adult.   If the patient needs to stay at the hospital during part of their recovery, the visitor guidelines for inpatient rooms apply.   Please refer to the [facility] website for the visitor guidelines for any additional information.     If you received a COVID test during your pre-op visit  it is requested that you wear a mask when out in public, stay away from anyone that may not be feeling well and notify your surgeon if you develop symptoms. If you have been in contact with anyone that has tested positive in the last 10 days please  notify you surgeon.         Pre-operative CHG Bathing Instructions    You can play a key role in reducing the risk of infection after surgery. Your skin needs to be as free of germs as possible. You can reduce the number of germs on your skin by washing with CHG (chlorhexidine gluconate) soap before surgery. CHG is an antiseptic soap that kills germs and continues to kill germs even after washing.    DO NOT use if you have an allergy to chlorhexidine/CHG or antibacterial soaps. If your skin becomes reddened or irritated, stop using the CHG and notify one of our RNs at [phone].  TAKE A SHOWER THE NIGHT BEFORE SURGERY AND THE DAY OF SURGERY     Please keep in mind the following:  You may shave your face before/day of surgery.  Place clean sheets on your bed the night before surgery Use a clean washcloth (not used since being washed) for each shower. DO NOT sleep with pet's night before surgery.   CHG Shower Instructions:  Wash your face and private area with normal soap. If you choose to wash your hair, wash first with your normal shampoo.  After you use shampoo/soap, rinse your hair and body thoroughly to remove shampoo/soap residue.  Turn the water OFF and apply half the bottle of CHG soap to a CLEAN washcloth.  Apply CHG soap ONLY FROM YOUR NECK DOWN TO YOUR TOES (washing for 3-5 minutes)  DO NOT use CHG soap on face, private areas, open wounds, or sores.  Pay special attention to the area where your surgery is being performed.  If you are having back surgery, having someone wash your back for you may be helpful. Wait 2 minutes after CHG soap is applied, then you may rinse off the CHG soap.  Pat dry with a clean towel  Put on clean pajamas     Additional instructions for the day of surgery: DO NOT APPLY any lotions, deodorants, or cologne.   Do not wear jewelry or makeup Do not wear nail polish, gel polish, artificial nails, or any other type of covering on  natural nails (fingers and toes) Do not bring valuables to the hospital. [facility] is not responsible for valuables/personal belongings. Put on clean/comfortable clothes.  Please brush your teeth.  Ask your nurse before applying any prescription medications to the skin.

## 2024-01-11 NOTE — Anesthesia Preprocedure Evaluation (Addendum)
 Anesthesia Evaluation  Patient identified by MRN, date of birth, ID band Patient awake    Reviewed: Allergy & Precautions, NPO status , Patient's Chart, lab work & pertinent test results, reviewed documented beta blocker date and time   History of Anesthesia Complications Negative for: history of anesthetic complications  Airway Mallampati: III  TM Distance: >3 FB Neck ROM: Full    Dental  (+) Edentulous Upper, Edentulous Lower   Pulmonary sleep apnea , COPD,  COPD inhaler, former smoker   Pulmonary exam normal        Cardiovascular hypertension, Pt. on medications and Pt. on home beta blockers + CAD, + Past MI, + Peripheral Vascular Disease and +CHF  Normal cardiovascular exam  Hx of severe AS s/p TAVR 10/2023  TTE 12/07/23: EF 60-65%, moderate LVH, grade 1 DD, mild RVE, mild pHTN,     Neuro/Psych negative neurological ROS     GI/Hepatic Neg liver ROS,,,GALLSTONE PANCREATITIS   Endo/Other  diabetes, Type 2, Oral Hypoglycemic Agents    Renal/GU Renal InsufficiencyRenal disease (Cr 1.58)  negative genitourinary   Musculoskeletal  (+) Arthritis ,    Abdominal   Peds  Hematology negative hematology ROS (+)   Anesthesia Other Findings Day of surgery medications reviewed with patient.  Reproductive/Obstetrics negative OB ROS                              Anesthesia Physical Anesthesia Plan  ASA: 3  Anesthesia Plan: General   Post-op Pain Management: Tylenol PO (pre-op)* and Regional block*   Induction: Intravenous  PONV Risk Score and Plan: 2 and Ondansetron, Dexamethasone and Treatment may vary due to age or medical condition  Airway Management Planned: Oral ETT  Additional Equipment: None  Intra-op Plan:   Post-operative Plan: Extubation in OR  Informed Consent: I have reviewed the patients History and Physical, chart, labs and discussed the procedure including the risks,  benefits and alternatives for the proposed anesthesia with the patient or authorized representative who has indicated his/her understanding and acceptance.     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 Pre op [facility] 09/28/23 showed severe calcified stenosis of the RCA unchanged from prior in 2023, patent mid LAD stent with moderate nonobstructive disease proximally, patent left circumflex, and patent subclavian with mild stenosis.   Echo 12/07/2023 showed EF 60 to 65%, moderate LVH, mild RVE, normally functioning TAVR with a mean gradient of 9.1 mmHg and no PVL as well as moderate MAC.  Post TAVR hospital course was complicated by AKI 2/2 acute gallstone pancreatitis as well as a subclavian seroma.  He was last seen in outpatient follow-up by Cline Crock, PA-C on 12/09/2023, stable at that time, noted chronic DOE likely related to lung disease and deconditioning.  Upcoming surgery was also discussed.  Per note, "Scheduled for lap-chole on 3/4 with Dr. Dwain Sarna. He is CLEARED for surgery from a cardiology perspective. He has been instructed to hold Coumadin for 5 days prior to surgery with a last dose on 12/14/23. He was not bridged with Lovenox prior to cath or TAVR and did  well without any thromboembolic events."  I spoke with patient at his preadmission testing appointment due to report of cough.  He states he had Covid in February, treated outpatient with Lagevrio. Lingering cough, minimally productive, no other symptoms. Former smoker with underlying COPD/Emphysema.  He is prescribed home oxygen but  states he rarely uses it.  Uses Symbicort daily. Also states he uses albuterol 3x daily when he takes his PO meds. States this is just what he has done, not necessarily using it for symptoms of SOB. Overall he says is DOE is essentially at baseline, he is just bothered by persistent cough after covid.  He was short of breath after walking from the parking deck to the front entrance of the hospital, but states this is normal for him.  On exam he appears well, no SOB at rest or with conversation. Lungs CTAB. Mild cough during interview.  Discussed likely simple postviral cough, however, if he finds it particularly bothersome he should discuss with his PCP.  I do not anticipate any issue proceeding with surgery.  History of CKD 3B.  Patient reports last dose of warfarin 01/11/2024.  Patient did not have instructions for Pletal.  I reached out to Dr. Doreen Salvage office and advised them to call patient with instructions.  Preop labs reviewed, potassium elevated at 5.6, creatinine elevated 1.58 consistent with history of CKD, otherwise unremarkable.  Will repeat i-STAT day of surgery due to hyperkalemia.  EKG 11/10/2023: Normal sinus rhythm.  Rate 86. Left axis deviation. Inferior infarct , age undetermined. Anteroseptal infarct , age undetermined  TTE 12/07/2023: 1. Left ventricular ejection fraction, by estimation, is 60 to 65%. The  left ventricle has normal function. The left ventricle has no regional  wall motion abnormalities. There is moderate left ventricular hypertrophy.  Left ventricular diastolic  parameters are consistent with Grade I diastolic dysfunction (impaired  relaxation).   2. Right ventricular systolic function is normal. The right ventricular  size is mildly enlarged. There is mildly elevated pulmonary artery  systolic pressure. The estimated right ventricular systolic pressure is  37.1 mmHg.   3. The mitral valve is degenerative. Trivial mitral valve regurgitation.  No evidence  of mitral stenosis. Moderate mitral annular calcification.   4. The aortic valve has been repaired/replaced. Aortic valve  regurgitation is not visualized. No aortic stenosis is present. There is a  26 mm Sapien prosthetic (TAVR) valve present in the aortic position.  Procedure Date: 11/08/2023. Echo findings are  consistent with normal structure and function of the aortic valve  prosthesis. Aortic valve area, by VTI measures 2.59 cm. Aortic valve mean  gradient measures 9.1 mmHg. Aortic valve Vmax measures 1.99 m/s. Aortic  valve acceleration time measures 53 msec.   5. The inferior vena cava is normal in size with greater than 50%  respiratory variability, suggesting right atrial pressure of 3 mmHg.   R/LHC 09/28/2023:    Ost LAD to Mid LAD lesion is 50% stenosed.   Ost RCA to Prox RCA lesion is 80% stenosed.   Prox RCA lesion is 25% stenosed.   Mid RCA lesion is 70% stenosed.   1st Diag lesion is 50% stenosed.   3rd Mrg lesion is 75% stenosed.   Dist RCA lesion is 50% stenosed.   Non-stenotic Mid LAD lesion was previously treated.   Hemodynamic findings consistent with moderate pulmonary hypertension.   1.  Severe calcific stenosis of the RCA, unchanged from the previous cardiac catheterization procedure 2.  Continued patency of the  stented segment in the mid LAD with moderate nonobstructive plaquing in the proximal vessel 3.  Patent left circumflex with no high-grade stenosis 4.  Patent left subclavian artery with mild calcified stenosis 5.  Calcified aortic valve leaflets with known severe aortic stenosis by noninvasive assessment 6.  Moderate pulmonary hypertension with mean PA pressure 39 mmHg, transpulmonary gradient 20 mmHg, PVR 4.26 Wood units   Recommendations: Continue TAVR evaluation.  Medical therapy for CAD.  Hydration with repeat labs in the morning, overnight observation.   )         Anesthesia Quick Evaluation

## 2024-01-17 ENCOUNTER — Ambulatory Visit (HOSPITAL_COMMUNITY): Payer: Self-pay | Admitting: Physician Assistant

## 2024-01-17 ENCOUNTER — Ambulatory Visit (HOSPITAL_COMMUNITY)

## 2024-01-17 ENCOUNTER — Encounter (HOSPITAL_COMMUNITY): Payer: Self-pay | Admitting: General Surgery

## 2024-01-17 ENCOUNTER — Encounter (HOSPITAL_COMMUNITY)
Admission: RE | Disposition: A | Discharge status: Home / Self Care | Payer: Self-pay | Source: Home / Self Care | Attending: General Surgery

## 2024-01-17 ENCOUNTER — Ambulatory Visit (HOSPITAL_COMMUNITY): Admitting: Anesthesiology

## 2024-01-17 ENCOUNTER — Other Ambulatory Visit: Payer: Self-pay

## 2024-01-17 ENCOUNTER — Inpatient Hospital Stay (HOSPITAL_COMMUNITY)
Admission: RE | Admit: 2024-01-17 | Discharge: 2024-01-21 | DRG: 417 | Disposition: A | Discharge status: Home / Self Care | Payer: Medicare Other | Attending: General Surgery | Admitting: General Surgery

## 2024-01-17 DIAGNOSIS — I10 Essential (primary) hypertension: Secondary | ICD-10-CM | POA: Diagnosis present

## 2024-01-17 DIAGNOSIS — J449 Chronic obstructive pulmonary disease, unspecified: Secondary | ICD-10-CM

## 2024-01-17 DIAGNOSIS — N1832 Chronic kidney disease, stage 3b: Secondary | ICD-10-CM | POA: Diagnosis present

## 2024-01-17 DIAGNOSIS — Z952 Presence of prosthetic heart valve: Secondary | ICD-10-CM

## 2024-01-17 DIAGNOSIS — E1122 Type 2 diabetes mellitus with diabetic chronic kidney disease: Secondary | ICD-10-CM | POA: Diagnosis present

## 2024-01-17 DIAGNOSIS — I2510 Atherosclerotic heart disease of native coronary artery without angina pectoris: Secondary | ICD-10-CM

## 2024-01-17 DIAGNOSIS — Z7951 Long term (current) use of inhaled steroids: Secondary | ICD-10-CM

## 2024-01-17 DIAGNOSIS — N400 Enlarged prostate without lower urinary tract symptoms: Secondary | ICD-10-CM | POA: Diagnosis present

## 2024-01-17 DIAGNOSIS — Z86718 Personal history of other venous thrombosis and embolism: Secondary | ICD-10-CM

## 2024-01-17 DIAGNOSIS — D72829 Elevated white blood cell count, unspecified: Secondary | ICD-10-CM | POA: Diagnosis present

## 2024-01-17 DIAGNOSIS — Z7984 Long term (current) use of oral hypoglycemic drugs: Secondary | ICD-10-CM

## 2024-01-17 DIAGNOSIS — Z955 Presence of coronary angioplasty implant and graft: Secondary | ICD-10-CM

## 2024-01-17 DIAGNOSIS — I252 Old myocardial infarction: Secondary | ICD-10-CM

## 2024-01-17 DIAGNOSIS — M109 Gout, unspecified: Secondary | ICD-10-CM | POA: Diagnosis present

## 2024-01-17 DIAGNOSIS — Z7901 Long term (current) use of anticoagulants: Secondary | ICD-10-CM

## 2024-01-17 DIAGNOSIS — I130 Hypertensive heart and chronic kidney disease with heart failure and stage 1 through stage 4 chronic kidney disease, or unspecified chronic kidney disease: Secondary | ICD-10-CM | POA: Diagnosis present

## 2024-01-17 DIAGNOSIS — Z86711 Personal history of pulmonary embolism: Secondary | ICD-10-CM

## 2024-01-17 DIAGNOSIS — Z8249 Family history of ischemic heart disease and other diseases of the circulatory system: Secondary | ICD-10-CM

## 2024-01-17 DIAGNOSIS — N189 Chronic kidney disease, unspecified: Secondary | ICD-10-CM | POA: Diagnosis present

## 2024-01-17 DIAGNOSIS — I5032 Chronic diastolic (congestive) heart failure: Secondary | ICD-10-CM | POA: Diagnosis present

## 2024-01-17 DIAGNOSIS — K8010 Calculus of gallbladder with chronic cholecystitis without obstruction: Principal | ICD-10-CM | POA: Diagnosis present

## 2024-01-17 DIAGNOSIS — E785 Hyperlipidemia, unspecified: Secondary | ICD-10-CM | POA: Diagnosis present

## 2024-01-17 DIAGNOSIS — Z8616 Personal history of COVID-19: Secondary | ICD-10-CM | POA: Diagnosis present

## 2024-01-17 DIAGNOSIS — E1151 Type 2 diabetes mellitus with diabetic peripheral angiopathy without gangrene: Secondary | ICD-10-CM | POA: Diagnosis present

## 2024-01-17 DIAGNOSIS — D6859 Other primary thrombophilia: Secondary | ICD-10-CM | POA: Diagnosis not present

## 2024-01-17 DIAGNOSIS — Z9049 Acquired absence of other specified parts of digestive tract: Principal | ICD-10-CM

## 2024-01-17 DIAGNOSIS — Z7902 Long term (current) use of antithrombotics/antiplatelets: Secondary | ICD-10-CM

## 2024-01-17 DIAGNOSIS — I739 Peripheral vascular disease, unspecified: Secondary | ICD-10-CM | POA: Diagnosis present

## 2024-01-17 DIAGNOSIS — J189 Pneumonia, unspecified organism: Secondary | ICD-10-CM | POA: Diagnosis present

## 2024-01-17 DIAGNOSIS — J439 Emphysema, unspecified: Secondary | ICD-10-CM | POA: Diagnosis present

## 2024-01-17 DIAGNOSIS — Z79899 Other long term (current) drug therapy: Secondary | ICD-10-CM

## 2024-01-17 DIAGNOSIS — N179 Acute kidney failure, unspecified: Secondary | ICD-10-CM | POA: Diagnosis present

## 2024-01-17 DIAGNOSIS — K851 Biliary acute pancreatitis without necrosis or infection: Secondary | ICD-10-CM | POA: Diagnosis not present

## 2024-01-17 DIAGNOSIS — Z885 Allergy status to narcotic agent status: Secondary | ICD-10-CM

## 2024-01-17 DIAGNOSIS — Z87891 Personal history of nicotine dependence: Secondary | ICD-10-CM

## 2024-01-17 DIAGNOSIS — Z555 Less than a high school diploma: Secondary | ICD-10-CM

## 2024-01-17 DIAGNOSIS — Z800 Family history of malignant neoplasm of digestive organs: Secondary | ICD-10-CM

## 2024-01-17 DIAGNOSIS — Z01818 Encounter for other preprocedural examination: Secondary | ICD-10-CM

## 2024-01-17 DIAGNOSIS — E119 Type 2 diabetes mellitus without complications: Secondary | ICD-10-CM

## 2024-01-17 DIAGNOSIS — Z604 Social exclusion and rejection: Secondary | ICD-10-CM | POA: Diagnosis present

## 2024-01-17 HISTORY — PX: CHOLECYSTECTOMY: SHX55

## 2024-01-17 LAB — POCT I-STAT, CHEM 8
BUN: 22 mg/dL (ref 8–23)
Calcium, Ion: 1.15 mmol/L (ref 1.15–1.40)
Chloride: 106 mmol/L (ref 98–111)
Creatinine, Ser: 1.6 mg/dL — ABNORMAL HIGH (ref 0.61–1.24)
Glucose, Bld: 93 mg/dL (ref 70–99)
HCT: 44 % (ref 39.0–52.0)
Hemoglobin: 15 g/dL (ref 13.0–17.0)
Potassium: 4.3 mmol/L (ref 3.5–5.1)
Sodium: 138 mmol/L (ref 135–145)
TCO2: 21 mmol/L — ABNORMAL LOW (ref 22–32)

## 2024-01-17 LAB — GLUCOSE, CAPILLARY
Glucose-Capillary: 102 mg/dL — ABNORMAL HIGH (ref 70–99)
Glucose-Capillary: 147 mg/dL — ABNORMAL HIGH (ref 70–99)
Glucose-Capillary: 127 mg/dL — ABNORMAL HIGH (ref 70–99)
Glucose-Capillary: 115 mg/dL — ABNORMAL HIGH (ref 70–99)

## 2024-01-17 LAB — APTT: aPTT: 35 s (ref 24–36)

## 2024-01-17 LAB — PROTIME-INR
INR: 1.2 (ref 0.8–1.2)
Prothrombin Time: 15.5 s — ABNORMAL HIGH (ref 11.4–15.2)

## 2024-01-17 SURGERY — LAPAROSCOPIC CHOLECYSTECTOMY WITH INTRAOPERATIVE CHOLANGIOGRAM
Anesthesia: General

## 2024-01-17 MED ORDER — DEXAMETHASONE SODIUM PHOSPHATE 10 MG/ML IJ SOLN
INTRAMUSCULAR | Status: DC | PRN
  Administered 2024-01-17: 4 mg via INTRAVENOUS

## 2024-01-17 MED ORDER — FENTANYL CITRATE (PF) 100 MCG/2ML IJ SOLN
INTRAMUSCULAR | Status: AC
  Filled 2024-01-17: qty 2

## 2024-01-17 MED ORDER — OXYCODONE HCL 5 MG/5ML PO SOLN: 5.0000 mg | Freq: Once | ORAL | Status: AC | PRN

## 2024-01-17 MED ORDER — SUGAMMADEX SODIUM 200 MG/2ML IV SOLN
INTRAVENOUS | Status: DC | PRN
  Administered 2024-01-17: 181.4 mg via INTRAVENOUS

## 2024-01-17 MED ORDER — CEFAZOLIN SODIUM-DEXTROSE 2-4 GM/100ML-% IV SOLN
2.0000 g | INTRAVENOUS | Status: AC
  Administered 2024-01-17: 2 g via INTRAVENOUS
  Filled 2024-01-17: qty 100

## 2024-01-17 MED ORDER — PROPOFOL 10 MG/ML IV BOLUS
INTRAVENOUS | Status: DC | PRN
  Administered 2024-01-17: 150 mg via INTRAVENOUS

## 2024-01-17 MED ORDER — SPY AGENT GREEN - (INDOCYANINE FOR INJECTION)
1.2500 mg | Freq: Once | INTRAMUSCULAR | Status: AC
  Administered 2024-01-17: 1.25 mg via INTRAVENOUS
  Filled 2024-01-17: qty 10

## 2024-01-17 MED ORDER — BUPIVACAINE-EPINEPHRINE 0.25% -1:200000 IJ SOLN
INTRAMUSCULAR | Status: DC | PRN
  Administered 2024-01-17: 5 mL

## 2024-01-17 MED ORDER — SIMETHICONE 80 MG PO CHEW: 40.0000 mg | CHEWABLE_TABLET | Freq: Four times a day (QID) | ORAL | Status: DC | PRN

## 2024-01-17 MED ORDER — MOMETASONE FURO-FORMOTEROL FUM 200-5 MCG/ACT IN AERO
2.0000 | INHALATION_SPRAY | Freq: Two times a day (BID) | RESPIRATORY_TRACT | Status: DC
  Administered 2024-01-18 – 2024-01-21 (×6): 2 via RESPIRATORY_TRACT
  Filled 2024-01-17 (×2): qty 8.8

## 2024-01-17 MED ORDER — FENTANYL CITRATE (PF) 250 MCG/5ML IJ SOLN
INTRAMUSCULAR | Status: AC
  Filled 2024-01-17: qty 5

## 2024-01-17 MED ORDER — GABAPENTIN 300 MG PO CAPS
600.0000 mg | ORAL_CAPSULE | Freq: Two times a day (BID) | ORAL | Status: DC
  Administered 2024-01-17 – 2024-01-21 (×8): 600 mg via ORAL
  Filled 2024-01-17 (×8): qty 2

## 2024-01-17 MED ORDER — CHLORHEXIDINE GLUCONATE CLOTH 2 % EX PADS: 6.0000 | MEDICATED_PAD | Freq: Once | CUTANEOUS | Status: DC

## 2024-01-17 MED ORDER — BUPIVACAINE LIPOSOME 1.3 % IJ SUSP
INTRAMUSCULAR | Status: DC | PRN
  Administered 2024-01-17 (×2): 10 mL via PERINEURAL

## 2024-01-17 MED ORDER — PHENYLEPHRINE HCL-NACL 20-0.9 MG/250ML-% IV SOLN
INTRAVENOUS | Status: DC | PRN
  Administered 2024-01-17: 30 ug/min via INTRAVENOUS

## 2024-01-17 MED ORDER — MORPHINE SULFATE (PF) 2 MG/ML IV SOLN: 2.0000 mg | INTRAVENOUS | Status: DC | PRN

## 2024-01-17 MED ORDER — HEMOSTATIC AGENTS (NO CHARGE) OPTIME
TOPICAL | Status: DC | PRN
  Administered 2024-01-17: 1 via TOPICAL

## 2024-01-17 MED ORDER — BUPIVACAINE-EPINEPHRINE (PF) 0.25% -1:200000 IJ SOLN
INTRAMUSCULAR | Status: AC
  Filled 2024-01-17: qty 30

## 2024-01-17 MED ORDER — METHOCARBAMOL 1000 MG/10ML IJ SOLN: 500.0000 mg | Freq: Three times a day (TID) | INTRAMUSCULAR | Status: DC | PRN

## 2024-01-17 MED ORDER — METOPROLOL TARTRATE 5 MG/5ML IV SOLN: 2.5000 mg | Freq: Four times a day (QID) | INTRAVENOUS | Status: DC

## 2024-01-17 MED ORDER — EMPAGLIFLOZIN 10 MG PO TABS
10.0000 mg | ORAL_TABLET | Freq: Every day | ORAL | Status: DC
  Administered 2024-01-18 – 2024-01-21 (×4): 10 mg via ORAL
  Filled 2024-01-17 (×4): qty 1

## 2024-01-17 MED ORDER — MOLNUPIRAVIR 200 MG PO CAPS: 4.0000 | ORAL_CAPSULE | Freq: Two times a day (BID) | ORAL | Status: DC

## 2024-01-17 MED ORDER — ORAL CARE MOUTH RINSE: 15.0000 mL | OROMUCOSAL | Status: DC | PRN

## 2024-01-17 MED ORDER — ALBUTEROL SULFATE HFA 108 (90 BASE) MCG/ACT IN AERS: 2.0000 | INHALATION_SPRAY | RESPIRATORY_TRACT | Status: DC | PRN

## 2024-01-17 MED ORDER — METOPROLOL SUCCINATE ER 100 MG PO TB24: 100.0000 mg | ORAL_TABLET | Freq: Every day | ORAL | Status: DC

## 2024-01-17 MED ORDER — FENTANYL CITRATE (PF) 250 MCG/5ML IJ SOLN
INTRAMUSCULAR | Status: DC | PRN
  Administered 2024-01-17 (×5): 50 ug via INTRAVENOUS

## 2024-01-17 MED ORDER — ORAL CARE MOUTH RINSE: 15.0000 mL | Freq: Once | OROMUCOSAL | Status: AC

## 2024-01-17 MED ORDER — LACTATED RINGERS IV SOLN: INTRAVENOUS | Status: DC

## 2024-01-17 MED ORDER — ONDANSETRON HCL 4 MG/2ML IJ SOLN
INTRAMUSCULAR | Status: DC | PRN
  Administered 2024-01-17: 4 mg via INTRAVENOUS

## 2024-01-17 MED ORDER — METOPROLOL SUCCINATE ER 100 MG PO TB24
100.0000 mg | ORAL_TABLET | Freq: Every day | ORAL | Status: DC
Start: 2024-01-17 — End: 2024-01-21
  Administered 2024-01-17 – 2024-01-21 (×4): 100 mg via ORAL
  Filled 2024-01-17 (×5): qty 1

## 2024-01-17 MED ORDER — METHOCARBAMOL 500 MG PO TABS
500.0000 mg | ORAL_TABLET | Freq: Three times a day (TID) | ORAL | Status: DC | PRN
  Administered 2024-01-18 – 2024-01-20 (×2): 500 mg via ORAL
  Filled 2024-01-17 (×2): qty 1

## 2024-01-17 MED ORDER — DROPERIDOL 2.5 MG/ML IJ SOLN: 0.6250 mg | Freq: Once | INTRAMUSCULAR | Status: DC | PRN

## 2024-01-17 MED ORDER — ALBUTEROL SULFATE (2.5 MG/3ML) 0.083% IN NEBU: 2.5000 mg | INHALATION_SOLUTION | RESPIRATORY_TRACT | Status: DC | PRN

## 2024-01-17 MED ORDER — OXYCODONE HCL 5 MG PO TABS
5.0000 mg | ORAL_TABLET | ORAL | Status: DC | PRN
  Administered 2024-01-17 – 2024-01-21 (×13): 5 mg via ORAL
  Filled 2024-01-17 (×13): qty 1

## 2024-01-17 MED ORDER — ENSURE PRE-SURGERY PO LIQD
296.0000 mL | Freq: Once | ORAL | Status: AC
  Administered 2024-01-17: 296 mL via ORAL

## 2024-01-17 MED ORDER — ROCURONIUM BROMIDE 10 MG/ML (PF) SYRINGE
PREFILLED_SYRINGE | INTRAVENOUS | Status: DC | PRN
  Administered 2024-01-17: 60 mg via INTRAVENOUS

## 2024-01-17 MED ORDER — PROPOFOL 10 MG/ML IV BOLUS
INTRAVENOUS | Status: AC
  Filled 2024-01-17: qty 20

## 2024-01-17 MED ORDER — INSULIN ASPART 100 UNIT/ML IJ SOLN: 0.0000 [IU] | INTRAMUSCULAR | Status: DC | PRN

## 2024-01-17 MED ORDER — BUPIVACAINE LIPOSOME 1.3 % IJ SUSP
INTRAMUSCULAR | Status: AC
Start: 2024-01-17 — End: 2024-01-17
  Filled 2024-01-17: qty 20

## 2024-01-17 MED ORDER — LIDOCAINE 2% (20 MG/ML) 5 ML SYRINGE
INTRAMUSCULAR | Status: DC | PRN
Start: 2024-01-17 — End: 2024-01-17
  Administered 2024-01-17: 100 mg via INTRAVENOUS

## 2024-01-17 MED ORDER — CHLORHEXIDINE GLUCONATE 0.12 % MT SOLN
15.0000 mL | Freq: Once | OROMUCOSAL | Status: AC
  Administered 2024-01-17: 15 mL via OROMUCOSAL
  Filled 2024-01-17: qty 15

## 2024-01-17 MED ORDER — SODIUM CHLORIDE 0.9 % IV SOLN: INTRAVENOUS | Status: DC

## 2024-01-17 MED ORDER — FENTANYL CITRATE (PF) 100 MCG/2ML IJ SOLN
25.0000 ug | INTRAMUSCULAR | Status: DC | PRN
  Administered 2024-01-17 (×5): 25 ug via INTRAVENOUS

## 2024-01-17 MED ORDER — ACETAMINOPHEN 500 MG PO TABS
1000.0000 mg | ORAL_TABLET | Freq: Four times a day (QID) | ORAL | Status: DC
  Administered 2024-01-17 – 2024-01-21 (×15): 1000 mg via ORAL
  Filled 2024-01-17 (×16): qty 2

## 2024-01-17 MED ORDER — OXYCODONE HCL 5 MG PO TABS
ORAL_TABLET | ORAL | Status: AC
  Filled 2024-01-17: qty 1

## 2024-01-17 MED ORDER — ACETAMINOPHEN 500 MG PO TABS
1000.0000 mg | ORAL_TABLET | Freq: Once | ORAL | Status: DC
  Filled 2024-01-17: qty 2

## 2024-01-17 MED ORDER — TAMSULOSIN HCL 0.4 MG PO CAPS
0.4000 mg | ORAL_CAPSULE | Freq: Every evening | ORAL | Status: DC
  Administered 2024-01-17 – 2024-01-20 (×4): 0.4 mg via ORAL
  Filled 2024-01-17 (×4): qty 1

## 2024-01-17 MED ORDER — OXYCODONE HCL 5 MG PO TABS
5.0000 mg | ORAL_TABLET | Freq: Once | ORAL | Status: AC | PRN
  Administered 2024-01-17: 5 mg via ORAL

## 2024-01-17 MED ORDER — ONDANSETRON HCL 4 MG PO TABS: 8.0000 mg | ORAL_TABLET | Freq: Two times a day (BID) | ORAL | Status: DC | PRN

## 2024-01-17 MED ORDER — BUPIVACAINE-EPINEPHRINE (PF) 0.25% -1:200000 IJ SOLN
INTRAMUSCULAR | Status: DC | PRN
  Administered 2024-01-17 (×2): 20 mL via PERINEURAL

## 2024-01-17 MED ORDER — ACETAMINOPHEN 500 MG PO TABS: 1000.0000 mg | ORAL_TABLET | ORAL | Status: DC

## 2024-01-17 SURGICAL SUPPLY — 41 items
APPLIER CLIP 5 13 M/L LIGAMAX5 (MISCELLANEOUS) ×1 IMPLANT
BAG COUNTER SPONGE SURGICOUNT (BAG) ×1 IMPLANT
BLADE CLIPPER SURG (BLADE) IMPLANT
CANISTER SUCT 3000ML PPV (MISCELLANEOUS) ×1 IMPLANT
CHLORAPREP W/TINT 26 (MISCELLANEOUS) ×1 IMPLANT
CLIP APPLIE 5 13 M/L LIGAMAX5 (MISCELLANEOUS) ×1 IMPLANT
COVER MAYO STAND STRL (DRAPES) IMPLANT
COVER SURGICAL LIGHT HANDLE (MISCELLANEOUS) ×1 IMPLANT
DERMABOND ADVANCED .7 DNX12 (GAUZE/BANDAGES/DRESSINGS) ×1 IMPLANT
DERMABOND ADVANCED .7 DNX6 (GAUZE/BANDAGES/DRESSINGS) IMPLANT
DRAPE C-ARM 42X120 X-RAY (DRAPES) IMPLANT
ELECT REM PT RETURN 9FT ADLT (ELECTROSURGICAL) ×1 IMPLANT
ELECTRODE REM PT RTRN 9FT ADLT (ELECTROSURGICAL) ×1 IMPLANT
GLOVE BIO SURGEON STRL SZ7 (GLOVE) ×1 IMPLANT
GLOVE BIOGEL PI IND STRL 7.5 (GLOVE) ×1 IMPLANT
GOWN STRL REUS W/ TWL LRG LVL3 (GOWN DISPOSABLE) ×3 IMPLANT
GRASPER SUT TROCAR 14GX15 (MISCELLANEOUS) ×1 IMPLANT
HEMOSTAT SNOW SURGICEL 2X4 (HEMOSTASIS) IMPLANT
IRRIG SUCT STRYKERFLOW 2 WTIP (MISCELLANEOUS) ×1 IMPLANT
IRRIGATION SUCT STRKRFLW 2 WTP (MISCELLANEOUS) ×1 IMPLANT
KIT BASIN OR (CUSTOM PROCEDURE TRAY) ×1 IMPLANT
KIT IMAGING PINPOINTPAQ (MISCELLANEOUS) IMPLANT
KIT TURNOVER KIT B (KITS) ×1 IMPLANT
NS IRRIG 1000ML POUR BTL (IV SOLUTION) ×1 IMPLANT
PAD ARMBOARD POSITIONER FOAM (MISCELLANEOUS) ×1 IMPLANT
POUCH RETRIEVAL ECOSAC 10 (ENDOMECHANICALS) ×1 IMPLANT
SCISSORS LAP 5X35 DISP (ENDOMECHANICALS) ×1 IMPLANT
SET CHOLANGIOGRAPH 5 50 .035 (SET/KITS/TRAYS/PACK) IMPLANT
SET TUBE SMOKE EVAC HIGH FLOW (TUBING) ×1 IMPLANT
SLEEVE Z-THREAD 5X100MM (TROCAR) ×2 IMPLANT
SPECIMEN JAR SMALL (MISCELLANEOUS) ×1 IMPLANT
STRIP CLOSURE SKIN 1/2X4 (GAUZE/BANDAGES/DRESSINGS) ×1 IMPLANT
SUT MNCRL AB 4-0 PS2 18 (SUTURE) ×1 IMPLANT
SUT VICRYL 0 UR6 27IN ABS (SUTURE) ×1 IMPLANT
TOWEL GREEN STERILE (TOWEL DISPOSABLE) ×1 IMPLANT
TOWEL GREEN STERILE FF (TOWEL DISPOSABLE) ×1 IMPLANT
TRAY LAPAROSCOPIC MC (CUSTOM PROCEDURE TRAY) ×1 IMPLANT
TROCAR BALLN 12MMX100 BLUNT (TROCAR) ×1 IMPLANT
TROCAR Z-THREAD OPTICAL 5X100M (TROCAR) ×1 IMPLANT
WARMER LAPAROSCOPE (MISCELLANEOUS) ×1 IMPLANT
WATER STERILE IRR 1000ML POUR (IV SOLUTION) ×1 IMPLANT

## 2024-01-17 NOTE — Anesthesia Procedure Notes (Signed)
 Anesthesia Regional Block: TAP block   Pre-Anesthetic Checklist: , timeout performed,  Correct Patient, Correct Site, Correct Laterality,  Correct Procedure, Correct Position, site marked,  Risks and benefits discussed,  Pre-op evaluation,  At surgeon's request and post-op pain management  Laterality: Right  Prep: Maximum Sterile Barrier Precautions used, chloraprep       Needles:  Injection technique: Single-shot  Needle Type: Echogenic Stimulator Needle     Needle Length: 9cm  Needle Gauge: 21     Additional Needles:   Narrative:  Start time: 01/17/2024 7:14 AM End time: 01/17/2024 7:17 AM Injection made incrementally with aspirations every 5 mL. Anesthesiologist: Kaylyn Layer, MD  Additional Notes: Risks, benefits, and alternative discussed. Patient gave consent for procedure. Patient prepped and draped in sterile fashion. Sedation administered, patient remains easily responsive to voice. Relevant anatomy identified with ultrasound guidance. Local anesthetic given in 5cc increments with no signs or symptoms of intravascular injection. No pain or paraesthesias with injection. Patient monitored throughout procedure with signs of LAST or immediate complications. Tolerated well. Ultrasound image placed in chart. Amalia Greenhouse, MD

## 2024-01-17 NOTE — H&P (Signed)
 78 year old male with a history of a supraumbilical hernia repair in the past who we saw status post a TAVR on January 21. He has a recurrent VTE on Coumadin. He also has chronic kidney disease stage IIIb as well as hypertension and peripheral vascular disease. He underwent a TAVR which was complicated by subclavian seroma as well as gallstone pancreatitis and an acute kidney injury. He was hypoxic and discharged on home O2. He is not wearing his oxygen. He complains of only abdominal pain when he ate pork grinds. His abdomen is otherwise nontender. He is having bowel movements and has no nausea or vomiting. His imaging previously showed a dilated gallbladder without obvious stones but there was a stone seen on a CT scan.He had an MRCP that showed acute pancreatitis and cholelithiasis.He had fairly significant pancreatitis that eventually did resolve. I repeated his CAT scan on 3 February that showed cholelithiasis and some other stable findings. There was no pseudocyst. Since then he has done well and he presents today to discuss having a cholecystectomy after he has been cleared by cardiology.  Review of Systems: A complete review of systems was obtained from the patient. I have reviewed this information and discussed as appropriate with the patient. See HPI as well for other ROS. Respiratory: Positive for shortness of breath.  All other systems reviewed and are negative.  Medical History: History reviewed. No pertinent past medical history.  Patient Active Problem List  Diagnosis  AAA (abdominal aortic aneurysm) (CMS-HCC)  Acute gallstone pancreatitis (HHS-HCC)  Benign hypertension with chronic kidney disease, stage III (CMS/HHS-HCC)  Bilateral renal artery stenosis (CMS-HCC)  Chronic diastolic CHF (congestive heart failure), NYHA class 2 (CMS/HHS-HCC)  DVT, lower extremity, recurrent (CMS/HHS-HCC)  Hyperlipidemia  S/P TAVR (transcatheter aortic valve replacement)   Past Surgical History:   Procedure Laterality Date  tavr   Allergies  Allergen Reactions  Codeine Hives   Current Outpatient Medications on File Prior to Visit  Medication Sig Dispense Refill  acetaminophen (TYLENOL) 500 MG tablet Take 500 mg by mouth once daily  albuterol MDI, PROVENTIL, VENTOLIN, PROAIR, HFA 90 mcg/actuation inhaler Inhale 2 inhalations into the lungs every 6 (six) hours as needed  allopurinoL (ZYLOPRIM) 300 MG tablet Take 1 tablet by mouth once daily  atorvastatin (LIPITOR) 20 MG tablet Take 20 mg by mouth once daily  budesonide-formoteroL (SYMBICORT) 160-4.5 mcg/actuation inhaler Inhale 2 inhalations into the lungs  cilostazoL (PLETAL) 100 MG tablet Take 1 tablet by mouth once daily  cyanocobalamin (VITAMIN B12) 1000 MCG tablet Take 1,000 mcg by mouth once daily  fenofibric (TRILIPIX) 135 mg DR capsule Take 1 tablet by mouth once daily  FUROsemide (LASIX) 20 MG tablet Take 1 tablet by mouth once daily  gabapentin (NEURONTIN) 300 MG capsule Take 300 mg by mouth 2 (two) times daily  JARDIANCE 10 mg tablet TAKE 1 TABLET (10 MG TOTAL) BY MOUTH DAILY AT 6PM.  metoprolol SUCCinate (TOPROL-XL) 100 MG XL tablet Take 100 mg by mouth once daily  nitroGLYcerin (NITROSTAT) 0.3 MG SL tablet Place 1 tablet (0.3 mg total) under the tongue every 5 (five) minutes as needed for Chest pain. Do not exceed more than three doses  ondansetron (ZOFRAN) 8 MG tablet TAKE 1 TABLET BY MOUTH TWICE DAILY AS NEEDED FOR NAUSEA  oxyCODONE (ROXICODONE) 5 MG immediate release tablet Take 5 mg by mouth every 4 (four) hours as needed  pyridoxine, vitamin B6, (VITAMIN B-6) 50 MG tablet Take 50 mg by mouth once daily  tamsulosin (FLOMAX) 0.4  mg capsule Take 0.4 mg by mouth once daily  warfarin (COUMADIN) 2 MG tablet Take 1 tablet by mouth once daily   History reviewed. No pertinent family history.   Social History   Tobacco Use  Smoking Status Never  Smokeless Tobacco Never  Marital status: Widowed  Tobacco Use  Smoking  status: Never  Smokeless tobacco: Never  Vaping Use  Vaping status: Never Used  Substance and Sexual Activity  Alcohol use: Not Currently  Drug use: Never   Objective:   Vitals:  12/13/23 1511  BP: (!) 165/84  Pulse: 85  Temp: 36.4 C (97.5 F)  SpO2: 91%  Weight: (!) 101.5 kg (223 lb 12.8 oz)  Height: 185.4 cm (6\' 1" )  PainSc: 0-No pain   Body mass index is 29.53 kg/m.  Physical Exam Vitals reviewed.  Constitutional:  Appearance: Normal appearance.  Abdominal:  General: There is no distension.  Tenderness: There is no abdominal tenderness.  Hernia: No hernia is present.  Neurological:  Mental Status: He is alert.   Assessment and Plan:   Gallstone pancreatitis (HHS-HCC)  Laparoscopic cholecystectomy  I discussed the procedure in detail. We discussed the risks and benefits of a laparoscopic cholecystectomy and possible cholangiogram including, but not limited to bleeding, infection, injury to surrounding structures such as the intestine or liver, bile leak, retained gallstones, need to convert to an open procedure, prolonged diarrhea, blood clots such as DVT, common bile duct injury, anesthesia risks, and possible need for additional procedures. The likelihood of improvement in symptoms and return to the patient's normal status is good. We discussed the typical post-operative recovery course.  Discussed risk of subtotal as well. Will hold coumadin per cards for five days then restart postop.

## 2024-01-17 NOTE — Plan of Care (Signed)
  Problem: Education: Goal: Knowledge of General Education information will improve Description: Including pain rating scale, medication(s)/side effects and non-pharmacologic comfort measures Outcome: Progressing   Problem: Health Behavior/Discharge Planning: Goal: Ability to manage health-related needs will improve Outcome: Progressing   Problem: Clinical Measurements: Goal: Will remain free from infection Outcome: Progressing Goal: Respiratory complications will improve Outcome: Progressing   Problem: Activity: Goal: Risk for activity intolerance will decrease Outcome: Progressing   Problem: Nutrition: Goal: Adequate nutrition will be maintained Outcome: Progressing   Problem: Pain Managment: Goal: General experience of comfort will improve and/or be controlled Outcome: Progressing   Problem: Safety: Goal: Ability to remain free from injury will improve Outcome: Progressing

## 2024-01-17 NOTE — Op Note (Signed)
 Preoperative diagnosis: history gallstone pancreatitis Postoperative diagnosis: saa Procedure: Laparoscopic cholecystectomy Surgeon: Dr. Harden Mo Estimated blood loss: Less than 50 cc Specimens: Gallbladder and contents to pathology Complications: None Drains: None Anesthesia: General Sponge needle count correct at completion Disposition to recovery stable addition   Indications: 78 year old male with a history of a supraumbilical hernia repair in the past who we saw status post a TAVR on January 21. He has a recurrent VTE on Coumadin. His imaging previously showed a dilated gallbladder without obvious stones but there was a stone seen on a CT scan.He had an MRCP that showed acute pancreatitis and cholelithiasis.He had fairly significant pancreatitis that eventually did resolve. I repeated his CAT scan on 3 February that showed cholelithiasis and some other stable findings. There was no pseudocyst. We discussed lap chole.      Procedure: After informed consent was obtained he was taken to the OR.  He was given antibiotics preoperatively.  He had SCDs in place.  He was placed under general anesthesia without complication.  He was prepped and draped in a standard sterile surgical fashion.  Surgical timeout was then performed.   I infiltrated Marcaine below the umbilicus and made a vertical incision. I grasped the fascia and incised it. I entered the peritoneum bluntly. This was done without injury.  I then inserted a Hassan trocar after placing a 0 Vicryl pursestring suture.  The abdomen was insufflated 15 mmHg pressure.  I then inserted three additional 5 mm trocars under direct vision. His omentum was adherent and I took this down with combination of blunt dissection and cautery.  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  Eventually I was able to visualize the entire gallbladder.  The gallbladder was retracted cephalad and lateral I was able to dissect and get a critical view of safety.  I  clearly saw both the artery and duct and there was a wide window behind that.  I also used the ICG dye to identify the common duct and cystic duct and this confirmed my anatomy. I then clipped the artery and divided it. I clipped the cystic duct and divided it leaving two clips in place. The clips completely traversed the duct and the duct was viable.  The gallbladder was then removed from the liver bed without difficulty.  This was placed in retrieval bag and removed from the umbilical incision. I did place some snow in the gallbladder bed. Hemostasis was observed.   I then removed the [facility] trocar and tied my pursestring down.  I placed another 0 Vicryl suture with using the suture passer device to completely obliterate this defect.  I then removed the remaining trocars and desufflated the abdomen.  These were closed with 4-0 Monocryl and glue.  He tolerated this well was extubated and transferred recovery stable.

## 2024-01-17 NOTE — Transfer of Care (Signed)
 Immediate Anesthesia Transfer of Care Note  Patient: Tony Zhang  Procedure(s) Performed: LAPAROSCOPIC CHOLECYSTECTOMY WITH ICG  Patient Location: PACU  Anesthesia Type:GA combined with regional for post-op pain  Level of Consciousness: awake, alert , and oriented  Airway & Oxygen Therapy: Patient Spontanous Breathing  Post-op Assessment: Report given to RN and Post -op Vital signs reviewed and stable  Post vital signs: Reviewed and stable  Last Vitals:  Vitals Value Taken Time  BP 182/62 01/17/24 0833  Temp    Pulse 83 01/17/24 0836  Resp 21 01/17/24 0836  SpO2 92 % 01/17/24 0836  Vitals shown include unfiled device data.  Last Pain:  Vitals:   01/17/24 0614  TempSrc:   PainSc: 8       Patients Stated Pain Goal: 4 (01/17/24 8657)  Complications: No notable events documented.

## 2024-01-17 NOTE — Anesthesia Postprocedure Evaluation (Signed)
 Anesthesia Post Note  Patient: Tony Zhang  Procedure(s) Performed: LAPAROSCOPIC CHOLECYSTECTOMY WITH ICG     Patient location during evaluation: PACU Anesthesia Type: General Level of consciousness: awake and alert Pain management: pain level controlled Vital Signs Assessment: post-procedure vital signs reviewed and stable Respiratory status: spontaneous breathing, nonlabored ventilation and respiratory function stable Cardiovascular status: blood pressure returned to baseline Postop Assessment: no apparent nausea or vomiting Anesthetic complications: no   No notable events documented.  Last Vitals:  Vitals:   01/17/24 0945 01/17/24 1000  BP: (!) 146/77 (!) 115/94  Pulse: 74 73  Resp: 18 17  Temp:  36.6 C  SpO2: 92% 93%                  Shanda Howells

## 2024-01-17 NOTE — Consult Note (Signed)
 Cardiology Consultation   Patient ID: DAMANTE SPRAGG MRN: [id number]; DOB: 1946/07/27  Admit date: 01/17/2024 Date of Consult: 01/17/2024  PCP:  System, Provider Not In   [facility] Providers Cardiologist:  Thurmon Fair, MD   {    Patient Profile:   Tony Zhang is a 78 y.o. 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 Cheral Almas.  History of Present Illness:   Mr. Shuck has past medical history as stated above. He has a history of CAD with NSTEMI post PCI with DES to LAD in 05/2022, he was also noted to have 80% stenosis of ostial RCA and heavily calcified 75% stenosis of OM3. 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   He has known low-flow/low-gradient severe aortic stenosis. Echo in 07/2023 showed stable EF 60-65%, normal RV function, no mitral valve disease, and worsening AS showing an gradient of 32 mmHg, AVA 0.72 cm2, DVI 0.18, SVI 28, and mild AR. He underwent LHC/RHC in 09/2023 that showed severe calcified stenosis of RCA, unchanged from prior cath, patent stent in mid LAD with moderate nonobstructive plaquing in proximal vessel, patent LCX with no high grade stenosis and patent left subclavian with mild calcified stenosis and moderate pulmonary HTN.  He underwent successful TAVR with 26 mm Edwards Sapien 3 Ultra Resilia THV via the TF approach on 11/09/23. Post-op echo showed EF 65%, normally functioning TAVR with mean gradient of 5 mmHg and no perivalvular leak. During his hospital course he  suffered an AKI secondary to gallstone pancreatitis (noted on CT and MRCP), at that point surgery was contacted and he was undergoing plans for lap-chole in near future.  He was last seen by Cline Crock, PA-C on 12/09/2023 for 1 month follow up post TAVR. At this appointment he was doing well, reporting some exertional shortness of breath when walking long distances and doing yard work. He reported some LE weakness, mainly in legs/knees. Denied any chest pain, syncope or dizziness. It was thought that his shortness of breath at this time was related to lung disease and general deconditioning. He was set to be seen back in 1 year with an updated echo.   He presented to hospital on 01/17/2024 to undergo laparoscopic cholecystectomy by Dr. Dwain Sarna. He underwent successful cholecystectomy and was stable when in recovery. Cardiology was consulted to be a part of the care team with this patient has he has an extensive cardiac history and recently underwent TAVR on 11/08/2023.   After speaking with the patient, he agrees with the history as stated above. He states that he is only feeling a little sore from his surgery. He denies any chest pain, dizziness, syncope, or lightheadedness. He states that the shortness of breath that he feels is about the same as it has been. He reports having recent COVID infection this past February and believes that has led him to have some days where he required oxygen at home. He states that he does not often wear his home oxygen because he does not want to become dependent on it. He is currently  on 2 L oxygen via [place] with SpO2 readings 96%.   Past Medical History:  Diagnosis Date   AAA (abdominal aortic aneurysm) (HCC)    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     CAD (coronary artery disease)    Chronic kidney disease    COPD (chronic obstructive pulmonary disease) (HCC)    Diabetes mellitus without  complication (HCC)    DJD (degenerative joint disease)    Dyslipidemia    Dyspnea    Gout    Malignant hypertension    Peripheral vascular disease (HCC)    Pneumonia 08/18/2013   pt. 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 Excell Seltzer and Dr. Laneta Simmers   Severe aortic stenosis    Venous thrombosis    Recurrent   Past Surgical History:  Procedure Laterality Date   APPENDECTOMY     BACK SURGERY     COLONOSCOPY N/A 04/13/2015   Procedure: COLONOSCOPY;  Surgeon: Carman Ching, MD;  Location: [facility] ENDOSCOPY;  Service: Endoscopy;  Laterality: N/A;   CORONARY STENT INTERVENTION N/A 06/04/2022   Procedure: CORONARY STENT INTERVENTION;  Surgeon: Corky Crafts, MD;  Location: [facility] INVASIVE CV LAB;  Service: Cardiovascular;  Laterality: N/A;   EMPYEMA DRAINAGE N/A 11/25/2014   Procedure: EMPYEMA DRAINAGE;  Surgeon: Delight Ovens, MD;  Location: [facility] OR;  Service: Thoracic;  Laterality: N/A;   ESOPHAGOGASTRODUODENOSCOPY N/A 04/10/2015   Procedure: ESOPHAGOGASTRODUODENOSCOPY (EGD);  Surgeon: Charlott Rakes, MD;  Location: [facility] ENDOSCOPY;  Service: Endoscopy;  Laterality: N/A;   EYE SURGERY     both eyes, cataracts removed, denies lens implants   FLEXIBLE SIGMOIDOSCOPY N/A 04/12/2015   Procedure: FLEXIBLE SIGMOIDOSCOPY;  Surgeon: Carman Ching, MD;  Location: [facility] ENDOSCOPY;  Service: Endoscopy;  Laterality: N/A;   HERNIA REPAIR     LEFT HEART CATH AND CORONARY ANGIOGRAPHY N/A 06/04/2022   Procedure: LEFT HEART CATH AND CORONARY ANGIOGRAPHY;  Surgeon: Corky Crafts, MD;  Location: [facility] INVASIVE CV LAB;  Service: Cardiovascular;  Laterality: N/A;   RIGHT HEART CATH AND CORONARY ANGIOGRAPHY N/A 09/28/2023   Procedure: RIGHT HEART CATH AND CORONARY ANGIOGRAPHY;  Surgeon: Tonny Bollman, MD;  Location:  [facility] INVASIVE CV LAB;  Service: Cardiovascular;  Laterality: N/A;   SHOULDER SURGERY Right    TRANSCATHETER AORTIC VALVE REPLACEMENT,SUBCLAVIAN Left 11/08/2023   Procedure: Transcatheter Aortic Valve Replacement-Subclavian;  Surgeon: Tonny Bollman, MD;  Location: [facility] INVASIVE CV LAB;  Service: Cardiovascular;  Laterality: Left;   TRANSESOPHAGEAL ECHOCARDIOGRAM (CATH LAB) N/A 11/08/2023   Procedure: TRANSESOPHAGEAL ECHOCARDIOGRAM;  Surgeon: Tonny Bollman, MD;  Location: [facility] INVASIVE CV LAB;  Service: Cardiovascular;  Laterality: N/A;   VIDEO ASSISTED THORACOSCOPY Right 11/25/2014   Procedure: VIDEO ASSISTED THORACOSCOPY;  Surgeon: Delight Ovens, MD;  Location: [facility] OR;  Service: Thoracic;  Laterality: Right;   VIDEO BRONCHOSCOPY N/A 11/25/2014   Procedure: VIDEO BRONCHOSCOPY;  Surgeon: Delight Ovens, MD;  Location: [facility] OR;  Service: Thoracic;  Laterality: N/A;    Home Medications:  Prior to Admission medications   Medication Sig Start Date End Date Taking? Authorizing Provider  acetaminophen (TYLENOL) 500 MG tablet Take 500 mg by mouth See admin instructions. Take 500mg  (1 tablet) by mouth three to four times a day.  Yes [provider]  albuterol (VENTOLIN HFA) 108 (90 Base) MCG/ACT inhaler Inhale 2 puffs into the lungs every 4 (four) hours as needed for wheezing or shortness of breath.   Yes [provider]  allopurinol (ZYLOPRIM) 300 MG tablet Take 300 mg by mouth at bedtime.   Yes [provider]  atorvastatin (LIPITOR) 20 MG tablet Take 1 tablet (20 mg total) by mouth daily. 10/05/23  Yes Croitoru, Mihai, MD  budesonide-formoterol (SYMBICORT) 160-4.5 MCG/ACT inhaler Inhale 2 puffs into the lungs in the morning and at bedtime. 02/28/23  Yes Almon Hercules, MD  Choline Fenofibrate (FENOFIBRIC ACID) 135 MG CPDR TAKE 1 TABLET BY MOUTH DAILY 09/26/23  Yes Tonny Bollman, MD  cilostazol (PLETAL) 100 MG tablet Take 100 mg by mouth daily.   Yes [provider]   cyanocobalamin 1000 MCG tablet Take 1 tablet (1,000 mcg total) by mouth daily. 03/03/23  Yes Almon Hercules, MD  empagliflozin (JARDIANCE) 10 MG TABS tablet Take 1 tablet (10 mg total) by mouth daily. 08/25/23  Yes Tonny Bollman, MD  furosemide (LASIX) 20 MG tablet Take 1 tablet (20 mg total) by mouth daily. Patient taking differently: Take 20 mg by mouth daily as needed for fluid or edema. 12/09/23  Yes Janetta Hora, PA-C  gabapentin (NEURONTIN) 600 MG tablet Take 600 mg by mouth 2 (two) times daily. 12/02/23 12/01/24 Yes [provider]  metoprolol succinate (TOPROL-XL) 100 MG 24 hr tablet Take 1 tablet (100 mg total) by mouth daily. 08/25/23  Yes Tonny Bollman, MD  nitroGLYCERIN (NITROSTAT) 0.3 MG SL tablet Place under tongue. May take every 5 minutes up to 3 doses for chest pain. If still having pain after 3 doses, call 911. 08/25/23  Yes Tonny Bollman, MD  ondansetron (ZOFRAN) 8 MG tablet Take 8 mg by mouth 2 (two) times daily as needed for vomiting or nausea. 05/25/22  Yes [provider]  pyridOXINE (B-6) 50 MG tablet Take 1 tablet (50 mg total) by mouth daily. 02/28/23  Yes Almon Hercules, MD  REPATHA SURECLICK 140 MG/ML SOAJ INJECT 140 MGS SUBCUTANEOUSLY EVERY 14 DAYS 11/28/23  Yes Croitoru, Mihai, MD  tamsulosin (FLOMAX) 0.4 MG CAPS capsule Take 0.4 mg by mouth every evening.   Yes [provider]  warfarin (COUMADIN) 2 MG tablet Take 2-4 mg by mouth See admin instructions. Take 2mg  (1 tablet) by mouth on weekdays and 4mg  (2 tablets) on weekends.   Yes [provider]  LAGEVRIO 200 MG CAPS capsule Take 4 capsules by mouth 2 (two) times daily. Patient not taking: Reported on 01/10/2024 12/15/23   [provider]   Inpatient Medications: Scheduled Meds:  acetaminophen  1,000 mg Oral Q6H   [START ON 01/18/2024] empagliflozin  10 mg Oral Daily   fentaNYL       fentaNYL       gabapentin  600 mg Oral BID   [START ON 01/18/2024] metoprolol succinate   100 mg Oral Daily   mometasone-formoterol  2 puff Inhalation BID   oxyCODONE       tamsulosin  0.4 mg Oral QPM   Continuous Infusions:  sodium chloride 40 mL/hr at 01/17/24 1504   PRN Meds: albuterol, fentaNYL, fentaNYL, methocarbamol **OR** methocarbamol (ROBAXIN) injection, morphine injection, ondansetron, mouth rinse, oxyCODONE, oxyCODONE, simethicone  Allergies:    Allergies  Allergen Reactions   Codeine Hives and Other (See Comments)    Takes benadryl to stop allergic reactions   Social History:   Social History  Socioeconomic History   Marital status: Widowed    Spouse name: Not on file   Number of children: 0   Years of education: Not on file   Highest education level: 6th grade  Occupational History    Comment: semi retired  Tobacco Use   Smoking status: Former    Current packs/day: 0.00    Types: Cigarettes    Quit date: 10/18/1992    Years since quitting: 31.2   Smokeless tobacco: Never  Vaping Use   Vaping status: Never Used  Substance and Sexual Activity   Alcohol use: No   Drug use: No   Sexual activity: Not on file  Other Topics Concern   Not on file  Social History Narrative   Not on file   Social Drivers of Health   Financial Resource Strain: Low Risk  (06/04/2022)   Overall Financial Resource Strain (CARDIA)    Difficulty of Paying Living Expenses: Not hard at all  Food Insecurity: No Food Insecurity (01/17/2024)   Hunger Vital Sign    Worried About Running Out of Food in the Last Year: Never true    Ran Out of Food in the Last Year: Never true  Transportation Needs: No Transportation Needs (01/17/2024)   PRAPARE - Administrator, Civil Service (Medical): No    Lack of Transportation (Non-Medical): No  Physical Activity: Not on file  Stress: Not on file  Social Connections: Socially Isolated (01/17/2024)   Social Connection and Isolation Panel [NHANES]    Frequency of Communication with Friends and Family: Once a week    Frequency of  Social Gatherings with Friends and Family: Once a week    Attends Religious Services: Never    Database administrator or Organizations: No    Attends Banker Meetings: Never    Marital Status: Separated  Intimate Partner Violence: Not At Risk (01/17/2024)   Humiliation, Afraid, Rape, and Kick questionnaire    Fear of Current or Ex-Partner: No    Emotionally Abused: No    Physically Abused: No    Sexually Abused: No    Family History:   Family History  Problem Relation Age of Onset   CAD Father 28   Liver cancer Brother    CAD Sister     ROS:  Please see the history of present illness.  All other ROS reviewed and negative.     Physical Exam/Data:   Vitals:   01/17/24 1300 01/17/24 1330 01/17/24 1345 01/17/24 1401  BP: (!) 174/80 (!) 176/57  (!) 136/59  Pulse: 85 89 91 90  Resp: (!) 24 (!) 23 (!) 23 20  Temp:   97.7 F (36.5 C) 98 F (36.7 C)  TempSrc:    Oral  SpO2: 94% 96% 96% 96%  Weight:      Height:        Intake/Output Summary (Last 24 hours) at 01/17/2024 1539 Last data filed at 01/17/2024 1504 Gross per 24 hour  Intake 1116.91 ml  Output 610 ml  Net 506.91 ml      01/17/2024    5:47 AM 01/10/2024    7:53 AM 12/09/2023    8:13 AM  Last 3 Weights  Weight (lbs) 200 lb 217 lb 8 oz 220 lb  Weight (kg) 90.719 kg 98.657 kg 99.791 kg     Body mass index is 26.39 kg/m.  General:  Well nourished, well developed, in no acute distress, on 2 L oxygen via  HEENT: normal Vascular:  Distal pulses 2+ bilaterally Cardiac:  normal S1, S2; RRR; no murmur  Lungs:  clear to auscultation bilaterally, no wheezing, rhonchi or rales  Abd: soft, nontender, no hepatomegaly  Ext: no edema Musculoskeletal:  No deformities, BUE and BLE strength normal and equal Skin: warm and dry  Neuro:  no focal abnormalities noted Psych:  Normal affect   Telemetry/ELG:  not currently on telemetry, no EKGs done this admission   Relevant CV Studies: Echocardiogram  12/07/2023 IMPRESSIONS  1. Left ventricular ejection fraction, by estimation, is 60 to 65% . The left ventricle has normal function. The left ventricle has no regional wall motion abnormalities. There is moderate left ventricular hypertrophy. Left ventricular diastolic parameters are consistent with Grade I diastolic dysfunction ( impaired relaxation) .  2. Right ventricular systolic function is normal. The right ventricular size is mildly enlarged. There is mildly elevated pulmonary artery systolic pressure. The estimated right ventricular systolic pressure is 37. 1 mmHg.  3. The mitral valve is degenerative. Trivial mitral valve regurgitation. No evidence of mitral stenosis. Moderate mitral annular calcification.  4. The aortic valve has been repaired/ replaced. Aortic valve regurgitation is not visualized. No aortic stenosis is present. There is a 26 mm Sapien prosthetic ( TAVR) valve present in the aortic position. Procedure Date: 01/ 21/ 2025. Echo findings are consistent with normal structure and function of the aortic valve prosthesis. Aortic valve area, by VTI measures 2. 59 cm . Aortic valve mean gradient measures 9. 1 mmHg. Aortic valve Vmax measures 1. 99 m/ s. Aortic valve acceleration time measures 53 msec.  5. The inferior vena cava is normal in size with greater than 50% respiratory variability, suggesting right atrial pressure of 3 mmHg.  Laboratory Data:  High Sensitivity Troponin:  No results for input(s): "TROPONINIHS" in the last 720 hours.   Chemistry Recent Labs  Lab 01/17/24 0636  NA 138  K 4.3  CL 106  GLUCOSE 93  BUN 22  CREATININE 1.60*    No results for input(s): "PROT", "ALBUMIN", "AST", "ALT", "ALKPHOS", "BILITOT" in the last 168 hours. Lipids No results for input(s): "CHOL", "TRIG", "HDL", "LABVLDL", "LDLCALC", "CHOLHDL" in the last 168 hours.  Hematology Recent Labs  Lab 01/17/24 0636  HGB 15.0  HCT 44.0   Thyroid No results for input(s): "TSH", "FREET4"  in the last 168 hours.  BNPNo results for input(s): "BNP", "PROBNP" in the last 168 hours.  DDimer No results for input(s): "DDIMER" in the last 168 hours.  Radiology/Studies:  No results found.  Assessment and Plan:  CALLOWAY ANDRUS is a 78 y.o. 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 Cheral Almas.  CAD s/p DES to LAD 05/2022 Pre-TAVR RHC/LHC showed: severe calcified stenosis of the RCA unchanged from prior in 2023, patent mid LAD stent with moderate nonobstructive disease proximally, patent left circumflex, and patent subclavian with mild stenosis  Patient denies any current chest pain and reports compliance with his home medications  Home medications included: atorvastatin 40 mg daily, Repatha 140mg  every 2 weeks -- held while admitted per primary  s/p TAVR 10/2023  Underwent successful TAVR with a 26 mm Edwards Sapien 3 Ultra Resilia THV via the TF approach on 11/09/23  Echo 12/07/23 showed EF 60-65%, mod LVH, mild RVE, normally functioning TAVR with a mean gradient of 9.1 mmHg and no PVL  Patient was recently seen by structural heart in clinic for 1 month follow up and was doing well, with complaints of shortness of breath thought to be related to lung disease and deconditioning  He denies any current or recent chest pain, syncope, dizziness or lightheadedness  He states that he is still experiencing some shortness of breath, states it is unchanged from when he was seen last in clinic 11/2023  Currently on 2 L oxygen via [place] with SpO2 at 96% He has home oxygen but says that the only time he used it after being discharged was when he had COVID about 1 month  ago  Hypertension Most recent BP 136/59 Scheduled to restart home Toprol 100 mg tomorrow Continue to monitor vitals  Chronic HFpEF Echo 12/07/23 showed EF 60-65%, mod LVH, mild RVE, normally functioning TAVR with a mean gradient of 9.1 mmHg and no PVL  Appears euvolemic on exam  Currently on 2 L oxygen via [place], has home oxygen but rarely uses it  Denies any changes in his shortness of breath since his TAVR  Will restart home Jardiance and Toprol tomorrow Holding home Lasix while admitted per primary   PAD Know extensive history of PAD with occlusion of distal abdominal aorta, renal artery stenosis, celiac/mesenteric artery stenosis, and 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

## 2024-01-17 NOTE — Anesthesia Procedure Notes (Signed)
 Anesthesia Regional Block: TAP block   Pre-Anesthetic Checklist: , timeout performed,  Correct Patient, Correct Site, Correct Laterality,  Correct Procedure, Correct Position, site marked,  Risks and benefits discussed,  Pre-op evaluation,  At surgeon's request and post-op pain management  Laterality: Left  Prep: Maximum Sterile Barrier Precautions used, chloraprep       Needles:  Injection technique: Single-shot  Needle Type: Echogenic Stimulator Needle     Needle Length: 9cm  Needle Gauge: 21     Additional Needles:   Narrative:  Start time: 01/17/2024 7:17 AM End time: 01/17/2024 7:19 AM Injection made incrementally with aspirations every 5 mL. Anesthesiologist: Kaylyn Layer, MD  Additional Notes: Risks, benefits, and alternative discussed. Patient gave consent for procedure. Patient prepped and draped in sterile fashion. Sedation administered, patient remains easily responsive to voice. Relevant anatomy identified with ultrasound guidance. Local anesthetic given in 5cc increments with no signs or symptoms of intravascular injection. No pain or paraesthesias with injection. Patient monitored throughout procedure with signs of LAST or immediate complications. Tolerated well. Ultrasound image placed in chart. Amalia Greenhouse, MD

## 2024-01-17 NOTE — Discharge Instructions (Signed)

## 2024-01-17 NOTE — Anesthesia Procedure Notes (Signed)
 Procedure Name: Intubation Date/Time: 01/17/2024 7:38 AM  Performed by: Randon Goldsmith, CRNAPre-anesthesia Checklist: Patient identified, Emergency Drugs available, Suction available and Patient being monitored Patient Re-evaluated:Patient Re-evaluated prior to induction Oxygen Delivery Method: Circle system utilized Preoxygenation: Pre-oxygenation with 100% oxygen Induction Type: IV induction Ventilation: Mask ventilation without difficulty, Oral airway inserted - appropriate to patient size and Two handed mask ventilation required Laryngoscope Size: Mac and 4 Grade View: Grade I Tube type: Oral Tube size: 7.5 mm Number of attempts: 1 Airway Equipment and Method: Stylet and Oral airway Placement Confirmation: ETT inserted through vocal cords under direct vision, positive ETCO2 and breath sounds checked- equal and bilateral Secured at: 23 cm Tube secured with: Tape Dental Injury: Teeth and Oropharynx as per pre-operative assessment

## 2024-01-18 ENCOUNTER — Encounter (HOSPITAL_COMMUNITY): Payer: Self-pay | Admitting: General Surgery

## 2024-01-18 DIAGNOSIS — Z9049 Acquired absence of other specified parts of digestive tract: Secondary | ICD-10-CM | POA: Diagnosis not present

## 2024-01-18 DIAGNOSIS — Z7901 Long term (current) use of anticoagulants: Secondary | ICD-10-CM

## 2024-01-18 LAB — CBC
HCT: 43.2 % (ref 39.0–52.0)
HCT: 43.9 % (ref 39.0–52.0)
Hemoglobin: 14 g/dL (ref 13.0–17.0)
Hemoglobin: 14.3 g/dL (ref 13.0–17.0)
MCH: 31.1 pg (ref 26.0–34.0)
MCH: 30.8 pg (ref 26.0–34.0)
MCHC: 32.4 g/dL (ref 30.0–36.0)
MCHC: 32.6 g/dL (ref 30.0–36.0)
MCV: 96 fL (ref 80.0–100.0)
MCV: 94.6 fL (ref 80.0–100.0)
Platelets: 268 10*3/uL (ref 150–400)
Platelets: 295 10*3/uL (ref 150–400)
RBC: 4.5 MIL/uL (ref 4.22–5.81)
RBC: 4.64 MIL/uL (ref 4.22–5.81)
RDW: 17.1 % — ABNORMAL HIGH (ref 11.5–15.5)
RDW: 16.8 % — ABNORMAL HIGH (ref 11.5–15.5)
WBC: 10.2 10*3/uL (ref 4.0–10.5)
WBC: 16.1 10*3/uL — ABNORMAL HIGH (ref 4.0–10.5)
nRBC: 0 % (ref 0.0–0.2)
nRBC: 0 % (ref 0.0–0.2)

## 2024-01-18 LAB — COMPREHENSIVE METABOLIC PANEL WITH GFR
ALT: 17 U/L (ref 0–44)
AST: 36 U/L (ref 15–41)
Albumin: 2.7 g/dL — ABNORMAL LOW (ref 3.5–5.0)
Alkaline Phosphatase: 45 U/L (ref 38–126)
Anion gap: 11 (ref 5–15)
BUN: 21 mg/dL (ref 8–23)
CO2: 18 mmol/L — ABNORMAL LOW (ref 22–32)
Calcium: 8.6 mg/dL — ABNORMAL LOW (ref 8.9–10.3)
Chloride: 104 mmol/L (ref 98–111)
Creatinine, Ser: 1.57 mg/dL — ABNORMAL HIGH (ref 0.61–1.24)
GFR, Estimated: 45 mL/min — ABNORMAL LOW (ref >60)
Glucose, Bld: 78 mg/dL (ref 70–99)
Potassium: 3.8 mmol/L (ref 3.5–5.1)
Sodium: 133 mmol/L — ABNORMAL LOW (ref 135–145)
Total Bilirubin: 1.6 mg/dL — ABNORMAL HIGH (ref 0.0–1.2)
Total Protein: 6.4 g/dL — ABNORMAL LOW (ref 6.5–8.1)

## 2024-01-18 LAB — GLUCOSE, CAPILLARY
Glucose-Capillary: 73 mg/dL (ref 70–99)
Glucose-Capillary: 74 mg/dL (ref 70–99)
Glucose-Capillary: 81 mg/dL (ref 70–99)
Glucose-Capillary: 67 mg/dL — ABNORMAL LOW (ref 70–99)
Glucose-Capillary: 75 mg/dL (ref 70–99)
Glucose-Capillary: 82 mg/dL (ref 70–99)

## 2024-01-18 LAB — BASIC METABOLIC PANEL WITH GFR
Anion gap: 7 (ref 5–15)
BUN: 17 mg/dL (ref 8–23)
CO2: 23 mmol/L (ref 22–32)
Calcium: 8.4 mg/dL — ABNORMAL LOW (ref 8.9–10.3)
Chloride: 103 mmol/L (ref 98–111)
Creatinine, Ser: 1.29 mg/dL — ABNORMAL HIGH (ref 0.61–1.24)
GFR, Estimated: 57 mL/min — ABNORMAL LOW (ref >60)
Glucose, Bld: 80 mg/dL (ref 70–99)
Potassium: 4.5 mmol/L (ref 3.5–5.1)
Sodium: 133 mmol/L — ABNORMAL LOW (ref 135–145)

## 2024-01-18 LAB — URINALYSIS, ROUTINE W REFLEX MICROSCOPIC
Bilirubin Urine: NEGATIVE
Glucose, UA: >=500 mg/dL — AB
Hgb urine dipstick: NEGATIVE
Ketones, ur: NEGATIVE mg/dL
Leukocytes,Ua: NEGATIVE
Nitrite: NEGATIVE
Protein, ur: 100 mg/dL — AB
Specific Gravity, Urine: 1.016 (ref 1.005–1.030)
pH: 6 (ref 5.0–8.0)

## 2024-01-18 LAB — RESP PANEL BY RT-PCR (RSV, FLU A&B, COVID)  RVPGX2
Influenza A by PCR: NEGATIVE
Influenza B by PCR: NEGATIVE
Resp Syncytial Virus by PCR: NEGATIVE
SARS Coronavirus 2 by RT PCR: NEGATIVE

## 2024-01-18 LAB — SURGICAL PATHOLOGY

## 2024-01-18 MED ORDER — ALLOPURINOL 300 MG PO TABS
300.0000 mg | ORAL_TABLET | Freq: Every day | ORAL | Status: DC
  Administered 2024-01-18 – 2024-01-20 (×3): 300 mg via ORAL
  Filled 2024-01-18 (×3): qty 1

## 2024-01-18 MED ORDER — DEXTROSE-SODIUM CHLORIDE 5-0.45 % IV SOLN: INTRAVENOUS | Status: AC

## 2024-01-18 MED ORDER — WARFARIN SODIUM 4 MG PO TABS
4.0000 mg | ORAL_TABLET | Freq: Once | ORAL | Status: AC
  Administered 2024-01-18: 4 mg via ORAL
  Filled 2024-01-18: qty 1

## 2024-01-18 MED ORDER — ATORVASTATIN CALCIUM 10 MG PO TABS
20.0000 mg | ORAL_TABLET | Freq: Every day | ORAL | Status: DC
  Administered 2024-01-18 – 2024-01-21 (×4): 20 mg via ORAL
  Filled 2024-01-18 (×4): qty 2

## 2024-01-18 MED ORDER — FENOFIBRATE 160 MG PO TABS
160.0000 mg | ORAL_TABLET | Freq: Every day | ORAL | Status: DC
Start: 2024-01-18 — End: 2024-01-21
  Administered 2024-01-18 – 2024-01-21 (×4): 160 mg via ORAL
  Filled 2024-01-18 (×4): qty 1

## 2024-01-18 MED ORDER — ENOXAPARIN SODIUM 40 MG/0.4ML IJ SOSY: 40.0000 mg | PREFILLED_SYRINGE | INTRAMUSCULAR | Status: DC

## 2024-01-18 MED ORDER — LACTATED RINGERS IV BOLUS
500.0000 mL | Freq: Once | INTRAVENOUS | Status: AC
  Administered 2024-01-18: 500 mL via INTRAVENOUS

## 2024-01-18 MED ORDER — WARFARIN - PHARMACIST DOSING INPATIENT: Freq: Every day | Status: DC

## 2024-01-18 MED ORDER — ENOXAPARIN SODIUM 100 MG/ML IJ SOSY
90.0000 mg | PREFILLED_SYRINGE | Freq: Two times a day (BID) | INTRAMUSCULAR | Status: DC
  Filled 2024-01-18: qty 0.9

## 2024-01-18 NOTE — Plan of Care (Signed)
  Problem: Education: Goal: Knowledge of General Education information will improve Description: Including pain rating scale, medication(s)/side effects and non-pharmacologic comfort measures Outcome: Progressing   Problem: Clinical Measurements: Goal: Respiratory complications will improve Outcome: Progressing Goal: Cardiovascular complication will be avoided Outcome: Progressing   Problem: Activity: Goal: Risk for activity intolerance will decrease Outcome: Progressing   Problem: Nutrition: Goal: Adequate nutrition will be maintained Outcome: Progressing   Problem: Pain Managment: Goal: General experience of comfort will improve and/or be controlled Outcome: Progressing   Problem: Safety: Goal: Ability to remain free from injury will improve Outcome: Progressing

## 2024-01-18 NOTE — Progress Notes (Addendum)
 Mobility Specialist Progress Note:   01/18/24 1158  Mobility  Activity Ambulated with assistance in hallway  Level of Assistance  (MinG)  Assistive Device Front wheel walker  Distance Ambulated (ft) 175 ft  Activity Response Tolerated well  Mobility Referral Yes  Mobility visit 1 Mobility  Mobility Specialist Start Time (ACUTE ONLY) 1100  Mobility Specialist Stop Time (ACUTE ONLY) 1120  Mobility Specialist Time Calculation (min) (ACUTE ONLY) 20 min   During Mobility: 82-90% SpO2 2 L Post Mobility:  94% SpO2 2 L  Pt received in chair, eager to mobility. C/o slight unsteadiness but no LOB present. Pt desat to 82% on 2 L but quickly elevated to 90% with pursed lip breathing. Pt left EOB as requested with call bell in reach and all needs met.   Leory Plowman  Mobility Specialist Please contact via [employer] office at [phone]

## 2024-01-18 NOTE — Progress Notes (Signed)
 1 Day Post-Op   Subjective/Chief Complaint: Sore, tired   Objective: Vital signs in last 24 hours: Temp:  [97.6 F (36.4 C)-98.4 F (36.9 C)] 98 F (36.7 C) (04/02 0721) Pulse Rate:  [67-91] 72 (04/02 0721) Resp:  [16-25] 16 (04/02 0721) BP: (106-182)/(41-102) 165/46 (04/02 0721) SpO2:  [89 %-97 %] 97 % (04/02 0721) Last BM Date : 01/17/24  Intake/Output from previous day: 04/01 0701 - 04/02 0700 In: 1116.9 [I.V.:1016.9; IV Piggyback:100] Out: 910 [Urine:900; Blood:10] Intake/Output this shift: No intake/output data recorded.  General nad Pulm effort normal, on oxygen Cv regular Ab soft approp tender incisions clean  Lab Results:  Recent Labs    01/17/24 0636 01/18/24 0627  WBC  --  10.2  HGB 15.0 14.0  HCT 44.0 43.2  PLT  --  268   BMET Recent Labs    01/17/24 0636 01/18/24 0627  NA 138 133*  K 4.3 4.5  CL 106 103  CO2  --  23  GLUCOSE 93 80  BUN 22 17  CREATININE 1.60* 1.29*  CALCIUM  --  8.4*   PT/INR Recent Labs    01/17/24 0633  LABPROT 15.5*  INR 1.2   ABG No results for input(s): "PHART", "HCO3" in the last 72 hours.  Invalid input(s): "PCO2", "PO2"  Studies/Results: No results found.  Anti-infectives: Anti-infectives (From admission, onward)    Start     Dose/Rate Route Frequency Ordered Stop   01/17/24 1500  molnupiravir EUA (LAGEVRIO) capsule 800 mg  Status:  Discontinued        4 capsule Oral 2 times daily 01/17/24 1407 01/17/24 1421   01/17/24 0600  ceFAZolin (ANCEF) IVPB 2g/100 mL premix        2 g 200 mL/hr over 30 Minutes Intravenous On call to O.R. 01/17/24 0546 01/17/24 0754       Assessment/Plan: POD 1 lap chole -carb mod diet as tolerated -bmet pending -cbc fine- I am ok with starting his coumadin again today. If want to start heparin that is fine also with no bolus please. -will have pt/ot see today- he lives alone -dispo pending per cardiology plans and therapies  Emelia Loron 01/18/2024

## 2024-01-18 NOTE — Care Management Obs Status (Signed)
 MEDICARE OBSERVATION STATUS NOTIFICATION   Patient Details  Name: Tony Zhang MRN: [id number] Date of Birth: June 26, 1946   Medicare Observation Status Notification Given:  Yes    Kingsley Plan, RN 01/18/2024, 2:10 PM

## 2024-01-18 NOTE — Progress Notes (Signed)
   01/18/24 1407  TOC Brief Assessment  Insurance and Status Reviewed  Patient has primary care physician Yes  Home environment has been reviewed independent, lives alone, if he needs assistance when he discharges home he "has someone"  Prior level of function: independent  Prior/Current Home Services No current home services  Social Drivers of Health Review SDOH reviewed no interventions necessary  Readmission risk has been reviewed No  Transition of care needs no transition of care needs at this time   Patient from home alone. Has two walkers , cane and shower chair . PCP is DR Rosalia Hammers in [place] .   He has home oxygen , however he does not have a portable oxygen tank , he stated he declined same and does not want one. He does not know the name of his oxygen company

## 2024-01-18 NOTE — Progress Notes (Signed)
 Patient Name: Tony Zhang Date of Encounter: 01/18/2024 [facility] Cardiologist: Thurmon Fair, MD   Interval Summary  .    Patient resting in bed. States he feels okay Has not yet had a BM or ambulated, scheduled to work with PT/OT later today He has had no cardiac symptoms or complications since surgery  He has close follow up scheduled with general cardiology and is okay to resume home medications when deemed medically stable by surgery  Vital Signs .    Vitals:   01/18/24 0448 01/18/24 0721 01/18/24 0757 01/18/24 0819  BP: (!) 118/44 (!) 165/46  (!) 165/46  Pulse: 69 72  72  Resp: 18 16    Temp: 98.1 F (36.7 C) 98 F (36.7 C)    TempSrc: Oral Oral    SpO2: 95% 97% 94%   Weight:      Height:        Intake/Output Summary (Last 24 hours) at 01/18/2024 0939 Last data filed at 01/17/2024 2200 Gross per 24 hour  Intake 266.91 ml  Output 900 ml  Net -633.09 ml      01/17/2024    5:47 AM 01/10/2024    7:53 AM 12/09/2023    8:13 AM  Last 3 Weights  Weight (lbs) 200 lb 217 lb 8 oz 220 lb  Weight (kg) 90.719 kg 98.657 kg 99.791 kg    Telemetry/ECG    Patient not on telemetry  Physical Exam .   GEN: No acute distress, currently on 2 L oxygen via [place].   Neck: No JVD Cardiac: RRR, no murmurs, rubs, or gallops.  Respiratory: Clear to auscultation bilaterally. GI: Soft, nontender, non-distended  MS: No edema  Assessment & Plan .     Tony Zhang is a 78 y.o. 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 Cheral Almas.   CAD s/p DES to LAD 05/2022 Pre-TAVR RHC/LHC showed: severe calcified stenosis of the RCA unchanged from prior in 2023,  patent mid LAD stent with moderate nonobstructive disease proximally, patent left circumflex, and patent subclavian with mild stenosis  Patient denies any current chest pain and reports compliance with his home medications  Home medications included: atorvastatin 40 mg daily, Repatha 140mg  every 2 weeks -- held while admitted per primary, okay to resume when stable from surgical standpoint    s/p TAVR 10/2023  Underwent successful TAVR with a 26 mm Edwards Sapien 3 Ultra Resilia THV via the TF approach on 11/09/23  Echo 12/07/23 showed EF 60-65%, mod LVH, mild RVE, normally functioning TAVR with a mean gradient of 9.1 mmHg and no PVL  Patient was recently seen by structural heart in clinic for 1 month follow up and was doing well, with complaints of shortness of breath thought to be related to lung disease and deconditioning  He denies any current or recent chest pain, syncope, dizziness or lightheadedness  He states that he is still experiencing some shortness of breath, states it is unchanged from when he was seen last in clinic 11/2023  Currently on 2 L oxygen via [place] with SpO2 at 96% He has home oxygen but says that the only time he used it after being discharged was when he  had COVID about 1 month ago   Hypertension Continue home Toprol 100 mg daily  Continue to monitor vitals   Chronic HFpEF Echo 12/07/23 showed EF 60-65%, mod LVH, mild RVE, normally functioning TAVR with a mean gradient of 9.1 mmHg and no PVL  Appears euvolemic on exam  Currently on 2 L oxygen via [place], has home oxygen but rarely uses it  Denies any changes in his shortness of breath since his TAVR  Continue home Toprol 100 mg daily and Jardiance 10 mg daily Holding home Lasix while admitted per primary    PAD Know extensive history of PAD with occlusion of distal abdominal aorta, renal artery stenosis, celiac/mesenteric artery stenosis, and 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

## 2024-01-18 NOTE — Progress Notes (Signed)
 Mobility Specialist Progress Note:   01/18/24 1134  Mobility  Activity Transferred from bed to chair  Level of Assistance Contact guard assist, steadying assist  Assistive Device Front wheel walker  Distance Ambulated (ft) 3 ft  Activity Response Tolerated well  Mobility Referral Yes  Mobility visit 1 Mobility  Mobility Specialist Start Time (ACUTE ONLY) 1025  Mobility Specialist Stop Time (ACUTE ONLY) 1040  Mobility Specialist Time Calculation (min) (ACUTE ONLY) 15 min   Pt received in bed, agreeable to mobility. SB bed mobility. CG during ambulation. Pt c/o slight abdominal pain, otherwise asx throughout. VSS on 2 L. Pt left in chair with call bell in reach and all needs met.   Leory Plowman  Mobility Specialist Please contact via [employer] office at [phone]

## 2024-01-18 NOTE — Progress Notes (Signed)
 PHARMACY - ANTICOAGULATION CONSULT NOTE  Pharmacy Consult:  Coumadin Indication: History of VTEs  Allergies  Allergen Reactions   Codeine Hives and Other (See Comments)    Takes benadryl to stop allergic reactions    Patient Measurements: Height: 6\' 1"  (185.4 cm) Weight: 90.7 kg (200 lb) IBW/kg (Calculated) : 79.9 HEPARIN DW (KG): 90.7  Vital Signs: Temp: 98 F (36.7 C) (04/02 0721) Temp Source: Oral (04/02 0721) BP: 165/46 (04/02 0819) Pulse Rate: 72 (04/02 0819)  Labs: Recent Labs    01/17/24 0633 01/17/24 0636 01/18/24 0627  HGB  --  15.0 14.0  HCT  --  44.0 43.2  PLT  --   --  268  APTT 35  --   --   LABPROT 15.5*  --   --   INR 1.2  --   --   CREATININE  --  1.60* 1.29*    Estimated Creatinine Clearance: 53.3 mL/min (A) (by C-G formula based on SCr of 1.29 mg/dL (H)).   Medical History: Past Medical History:  Diagnosis Date   AAA (abdominal aortic aneurysm) (HCC)    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     CAD (coronary artery disease)    Chronic kidney disease    COPD (chronic obstructive pulmonary disease) (HCC)    Diabetes mellitus without complication (HCC)    DJD (degenerative joint disease)    Dyslipidemia    Dyspnea    Gout    Malignant hypertension    Peripheral vascular disease (HCC)    Pneumonia 08/18/2013   pt. 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 Excell Seltzer and Dr. Laneta Simmers   Severe aortic stenosis    Venous thrombosis    Recurrent    Assessment: 54 YOM with gallstone pancreatitis now s/p cholecystectomy on 4/1.  Pharmacy consulted to resume PTA Coumadin for history of recurrent VTEs.  INR sub-therapeutic at 1.2, last Coumadin dose on 3/26.  No bleeding reported; CBC  stable.  Home Coumadin regimen:  2mg  PO daily except 4mg  on Sat/Sun  Goal of Therapy:  INR 2-3 Monitor platelets by anticoagulation protocol: Yes   Plan:  Coumadin 4mg  PO today Daily PT / INR  Roderick Sweezy D. Laney Potash, PharmD, BCPS, BCCCP 01/18/2024, 9:24 AM

## 2024-01-19 ENCOUNTER — Observation Stay (HOSPITAL_COMMUNITY)

## 2024-01-19 DIAGNOSIS — N189 Chronic kidney disease, unspecified: Secondary | ICD-10-CM

## 2024-01-19 DIAGNOSIS — I2510 Atherosclerotic heart disease of native coronary artery without angina pectoris: Secondary | ICD-10-CM | POA: Diagnosis present

## 2024-01-19 DIAGNOSIS — Z8616 Personal history of COVID-19: Secondary | ICD-10-CM

## 2024-01-19 DIAGNOSIS — D72829 Elevated white blood cell count, unspecified: Secondary | ICD-10-CM | POA: Diagnosis not present

## 2024-01-19 DIAGNOSIS — Z86711 Personal history of pulmonary embolism: Secondary | ICD-10-CM | POA: Diagnosis not present

## 2024-01-19 DIAGNOSIS — Z9049 Acquired absence of other specified parts of digestive tract: Secondary | ICD-10-CM

## 2024-01-19 DIAGNOSIS — Z952 Presence of prosthetic heart valve: Secondary | ICD-10-CM

## 2024-01-19 DIAGNOSIS — E785 Hyperlipidemia, unspecified: Secondary | ICD-10-CM | POA: Diagnosis present

## 2024-01-19 DIAGNOSIS — I739 Peripheral vascular disease, unspecified: Secondary | ICD-10-CM

## 2024-01-19 DIAGNOSIS — Z79899 Other long term (current) drug therapy: Secondary | ICD-10-CM | POA: Diagnosis not present

## 2024-01-19 DIAGNOSIS — I130 Hypertensive heart and chronic kidney disease with heart failure and stage 1 through stage 4 chronic kidney disease, or unspecified chronic kidney disease: Secondary | ICD-10-CM | POA: Diagnosis present

## 2024-01-19 DIAGNOSIS — J189 Pneumonia, unspecified organism: Secondary | ICD-10-CM

## 2024-01-19 DIAGNOSIS — Z7901 Long term (current) use of anticoagulants: Secondary | ICD-10-CM | POA: Diagnosis not present

## 2024-01-19 DIAGNOSIS — I10 Essential (primary) hypertension: Secondary | ICD-10-CM

## 2024-01-19 DIAGNOSIS — K8010 Calculus of gallbladder with chronic cholecystitis without obstruction: Secondary | ICD-10-CM | POA: Diagnosis present

## 2024-01-19 DIAGNOSIS — I252 Old myocardial infarction: Secondary | ICD-10-CM | POA: Diagnosis not present

## 2024-01-19 DIAGNOSIS — Z87891 Personal history of nicotine dependence: Secondary | ICD-10-CM | POA: Diagnosis not present

## 2024-01-19 DIAGNOSIS — Z955 Presence of coronary angioplasty implant and graft: Secondary | ICD-10-CM | POA: Diagnosis not present

## 2024-01-19 DIAGNOSIS — Z7984 Long term (current) use of oral hypoglycemic drugs: Secondary | ICD-10-CM | POA: Diagnosis not present

## 2024-01-19 DIAGNOSIS — I5032 Chronic diastolic (congestive) heart failure: Secondary | ICD-10-CM | POA: Diagnosis present

## 2024-01-19 DIAGNOSIS — Z7951 Long term (current) use of inhaled steroids: Secondary | ICD-10-CM | POA: Diagnosis not present

## 2024-01-19 DIAGNOSIS — E1151 Type 2 diabetes mellitus with diabetic peripheral angiopathy without gangrene: Secondary | ICD-10-CM | POA: Diagnosis present

## 2024-01-19 DIAGNOSIS — N1832 Chronic kidney disease, stage 3b: Secondary | ICD-10-CM | POA: Diagnosis present

## 2024-01-19 DIAGNOSIS — D6859 Other primary thrombophilia: Secondary | ICD-10-CM

## 2024-01-19 DIAGNOSIS — E1122 Type 2 diabetes mellitus with diabetic chronic kidney disease: Secondary | ICD-10-CM | POA: Diagnosis present

## 2024-01-19 DIAGNOSIS — N400 Enlarged prostate without lower urinary tract symptoms: Secondary | ICD-10-CM | POA: Diagnosis present

## 2024-01-19 DIAGNOSIS — Z86718 Personal history of other venous thrombosis and embolism: Secondary | ICD-10-CM

## 2024-01-19 DIAGNOSIS — N179 Acute kidney failure, unspecified: Secondary | ICD-10-CM

## 2024-01-19 DIAGNOSIS — Z8249 Family history of ischemic heart disease and other diseases of the circulatory system: Secondary | ICD-10-CM | POA: Diagnosis not present

## 2024-01-19 DIAGNOSIS — J439 Emphysema, unspecified: Secondary | ICD-10-CM | POA: Diagnosis present

## 2024-01-19 LAB — RESPIRATORY PANEL BY PCR

## 2024-01-19 LAB — BASIC METABOLIC PANEL WITH GFR
Anion gap: 11 (ref 5–15)
BUN: 27 mg/dL — ABNORMAL HIGH (ref 8–23)
CO2: 18 mmol/L — ABNORMAL LOW (ref 22–32)
Calcium: 8.1 mg/dL — ABNORMAL LOW (ref 8.9–10.3)
Chloride: 103 mmol/L (ref 98–111)
Creatinine, Ser: 1.86 mg/dL — ABNORMAL HIGH (ref 0.61–1.24)
GFR, Estimated: 37 mL/min — ABNORMAL LOW (ref >60)
Glucose, Bld: 95 mg/dL (ref 70–99)
Potassium: 4.4 mmol/L (ref 3.5–5.1)
Sodium: 132 mmol/L — ABNORMAL LOW (ref 135–145)

## 2024-01-19 LAB — PROTIME-INR
INR: 1.3 — ABNORMAL HIGH (ref 0.8–1.2)
Prothrombin Time: 16.3 s — ABNORMAL HIGH (ref 11.4–15.2)

## 2024-01-19 LAB — CBC WITH DIFFERENTIAL/PLATELET
Abs Immature Granulocytes: 0.1 10*3/uL — ABNORMAL HIGH (ref 0.00–0.07)
Basophils Absolute: 0 10*3/uL (ref 0.0–0.1)
Basophils Relative: 0 %
Eosinophils Absolute: 0.1 10*3/uL (ref 0.0–0.5)
Eosinophils Relative: 1 %
HCT: 40.9 % (ref 39.0–52.0)
Hemoglobin: 13 g/dL (ref 13.0–17.0)
Immature Granulocytes: 1 %
Lymphocytes Relative: 6 %
Lymphs Abs: 0.6 10*3/uL — ABNORMAL LOW (ref 0.7–4.0)
MCH: 30.4 pg (ref 26.0–34.0)
MCHC: 31.8 g/dL (ref 30.0–36.0)
MCV: 95.6 fL (ref 80.0–100.0)
Monocytes Absolute: 0.8 10*3/uL (ref 0.1–1.0)
Monocytes Relative: 8 %
Neutro Abs: 9.4 10*3/uL — ABNORMAL HIGH (ref 1.7–7.7)
Neutrophils Relative %: 84 %
Platelets: 216 10*3/uL (ref 150–400)
RBC: 4.28 MIL/uL (ref 4.22–5.81)
RDW: 17 % — ABNORMAL HIGH (ref 11.5–15.5)
WBC: 11.1 10*3/uL — ABNORMAL HIGH (ref 4.0–10.5)
nRBC: 0 % (ref 0.0–0.2)

## 2024-01-19 LAB — GLUCOSE, CAPILLARY
Glucose-Capillary: 93 mg/dL (ref 70–99)
Glucose-Capillary: 95 mg/dL (ref 70–99)
Glucose-Capillary: 96 mg/dL (ref 70–99)
Glucose-Capillary: 80 mg/dL (ref 70–99)

## 2024-01-19 LAB — PROCALCITONIN: Procalcitonin: 3.29 ng/mL

## 2024-01-19 MED ORDER — WARFARIN SODIUM 4 MG PO TABS
4.0000 mg | ORAL_TABLET | Freq: Once | ORAL | Status: AC
  Administered 2024-01-19: 4 mg via ORAL
  Filled 2024-01-19: qty 1

## 2024-01-19 MED ORDER — SODIUM CHLORIDE 0.9 % IV SOLN
2.0000 g | INTRAVENOUS | Status: DC
  Administered 2024-01-19 – 2024-01-20 (×2): 2 g via INTRAVENOUS
  Filled 2024-01-19 (×2): qty 20

## 2024-01-19 MED ORDER — PIPERACILLIN-TAZOBACTAM 3.375 G IVPB 30 MIN: 3.3750 g | Freq: Once | INTRAVENOUS | Status: DC

## 2024-01-19 MED ORDER — PIPERACILLIN-TAZOBACTAM 3.375 G IVPB: 3.3750 g | Freq: Three times a day (TID) | INTRAVENOUS | Status: DC

## 2024-01-19 MED ORDER — AZITHROMYCIN 500 MG IV SOLR
500.0000 mg | INTRAVENOUS | Status: DC
  Administered 2024-01-19: 500 mg via INTRAVENOUS
  Filled 2024-01-19 (×2): qty 5

## 2024-01-19 MED ORDER — SACCHAROMYCES BOULARDII 250 MG PO CAPS
250.0000 mg | ORAL_CAPSULE | Freq: Two times a day (BID) | ORAL | Status: DC
  Administered 2024-01-19 – 2024-01-21 (×4): 250 mg via ORAL
  Filled 2024-01-19 (×4): qty 1

## 2024-01-19 NOTE — Progress Notes (Addendum)
 PHARMACY - ANTICOAGULATION CONSULT NOTE  Pharmacy Consult:  Coumadin Indication: History of VTEs  Allergies  Allergen Reactions   Codeine Hives and Other (See Comments)    Takes benadryl to stop allergic reactions    Patient Measurements: Height: 6\' 1"  (185.4 cm) Weight: 90.7 kg (200 lb) IBW/kg (Calculated) : 79.9 HEPARIN DW (KG): 90.7  Vital Signs: Temp: 97.4 F (36.3 C) (04/03 0941) Temp Source: Oral (04/03 0941) BP: 109/54 (04/03 0941) Pulse Rate: 72 (04/03 0941)  Labs: Recent Labs    01/17/24 0981 01/17/24 0636 01/18/24 0627 01/18/24 2232 01/19/24 0700 01/19/24 1005  HGB  --    < > 14.0 14.3  --  13.0  HCT  --    < > 43.2 43.9  --  40.9  PLT  --   --  268 295  --  216  APTT 35  --   --   --   --   --   LABPROT 15.5*  --   --   --  16.3*  --   INR 1.2  --   --   --  1.3*  --   CREATININE  --    < > 1.29* 1.57*  --  1.86*   < > = values in this interval not displayed.    Estimated Creatinine Clearance: 37 mL/min (A) (by C-G formula based on SCr of 1.86 mg/dL (H)).   Medical History: Past Medical History:  Diagnosis Date   AAA (abdominal aortic aneurysm) (HCC)    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     CAD (coronary artery disease)    Chronic kidney disease    COPD (chronic obstructive pulmonary disease) (HCC)    Diabetes mellitus without complication (HCC)    DJD (degenerative joint disease)    Dyslipidemia    Dyspnea    Gout    Malignant hypertension    Peripheral vascular disease (HCC)    Pneumonia 08/18/2013   pt. 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 Excell Seltzer and Dr. Laneta Simmers   Severe aortic stenosis    Venous thrombosis    Recurrent    Assessment: 59 YOM with gallstone  pancreatitis now s/p cholecystectomy on 4/1.  Pharmacy consulted to resume PTA Coumadin for history of recurrent VTEs. Baseline INR sub-therapeutic at 1.2, last Coumadin dose on 3/26.  No bleeding reported; CBC stable.  Home Coumadin regimen:  2mg  PO daily except 4mg  on Sat/Sun  POD2 lap chole, INR subtherapeutic at 1.3, as expected given first dose 4/2 at 1600, hopefully response will be noted tomorrow. Ate 100% of meal 4/3, CBC stable. Allopurinol resumed 4/2 PM however is a home med.   Goal of Therapy:  INR 2-3 Monitor platelets by anticoagulation protocol: Yes   Plan:  Coumadin 4mg  PO today to reach total weekly dose per home regimen, consider resume 2 mg on weekdays if responding appropriately Daily PT / INR  Rutherford Nail, PharmD PGY2 Critical Care Pharmacy Resident 01/19/2024, 12:02 PM

## 2024-01-19 NOTE — TOC Progression Note (Signed)
 Transition of Care (TOC) - Progression Note   Declining home health PT .   Patient from home alone. Has two walkers , cane and shower chair . PCP is DR Rosalia Hammers in [place] .    He has home oxygen , however he does not have a portable oxygen tank , he stated he declined same and does not want one. He does not know the name of his oxygen company  Patient Details  Name: Tony Zhang MRN: [id number] Date of Birth: 25-Aug-1946  Transition of Care [facility]) CM/SW Contact  Fatih Stalvey, Adria Devon, RN Phone Number: 01/19/2024, 1:49 PM  Clinical Narrative:            Expected Discharge Plan and Services                                               Social Determinants of Health (SDOH) Interventions SDOH Screenings   Food Insecurity: No Food Insecurity (01/17/2024)  Housing: Low Risk  (01/17/2024)  Transportation Needs: No Transportation Needs (01/17/2024)  Utilities: Not At Risk (01/17/2024)  Alcohol Screen: Low Risk  (06/04/2022)  Financial Resource Strain: Low Risk  (06/04/2022)  Social Connections: Socially Isolated (01/17/2024)  Tobacco Use: Medium Risk (01/17/2024)    Readmission Risk Interventions    11/24/2023   12:05 PM  Readmission Risk Prevention Plan  Transportation Screening Complete  Home Care Screening Complete  Medication Review (RN CM) Complete

## 2024-01-19 NOTE — Evaluation (Signed)
 Physical Therapy Evaluation Patient Details Name: Tony Zhang MRN: [id number] DOB: July 01, 1946 Today's Date: 01/19/2024  History of Present Illness  Pt is 78 yo admitted 01/17/24 with positive acute pancreatitis and cholelithiasis  noted on recent MRCP.  Underwent lap chole on 01/17/24.  PMH positive for recent TAVR on 1/21 complicated by ICU stay from 1/24-1/26 for decreased LOC and labile pressures. PMH includes: CAD (NSTEMI with stents placed 8/23), HFrEF, PAD, DVT/PE, HTN, COPD, CKD, neuropathy, and gout.  Clinical Impression  Patient presents with decreased mobility due to pain, limited activity tolerance and new O2 use.  States used in the past when he had Covid though not recently.  Patient living alone and has friends who can help intermittently.  Encouraged him to ask them for help at d/c.  Currently close S to CGA for mobility with RW.  Seated rest needed as he pushed himself and reported backe "giving out".  Feel he could benefit from home safety evaluation with HHPT  as he lives alone though pt currently refusing.  PT to follow up for stair training prior to d/c and may need O2 sat qualification.       If plan is discharge home, recommend the following: Assistance with cooking/housework;Assist for transportation;Help with stairs or ramp for entrance   Can travel by private vehicle        Equipment Recommendations None recommended by PT  Recommendations for Other Services       Functional Status Assessment Patient has had a recent decline in their functional status and demonstrates the ability to make significant improvements in function in a reasonable and predictable amount of time.     Precautions / Restrictions Precautions Precautions: Fall Precaution/Restrictions Comments: watch O2      Mobility  Bed Mobility Overal bed mobility: Modified Independent                  Transfers Overall transfer level: Needs assistance Equipment used: Rolling walker (2  wheels) Transfers: Sit to/from Stand Sit to Stand: Supervision           General transfer comment: up under his own power and confident though supervision for safety and reinforced safety not to get up unaided due to O2 and SpO2 monitor    Ambulation/Gait Ambulation/Gait assistance: Supervision, Contact guard assist Gait Distance (Feet): 300 Feet (with one seated rest due to back pain) Assistive device: Rolling walker (2 wheels) Gait Pattern/deviations: Step-through pattern, Decreased stride length, Trunk flexed       General Gait Details: slower pace, stopping x 3 for resting his back, resting shoulders and extending trunk despite abdominal pain, seated rest in desk chair x 1 due to back "giving out"  Stairs            Wheelchair Mobility     Tilt Bed    Modified Rankin (Stroke Patients Only)       Balance Overall balance assessment: Needs assistance Sitting-balance support: Feet supported Sitting balance-Leahy Scale: Good Sitting balance - Comments: no LOB though reports would hurt his abdomen to reach to feet to don socks so PT assisted     Standing balance-Leahy Scale: Fair Standing balance comment: can stand static no UE support, needs UE's for ambulation                             Pertinent Vitals/Pain Pain Assessment Pain Assessment: 0-10 Pain Score: 7  Pain Location: abdomen and shoulder R Pain  Descriptors / Indicators: Aching, Sore Pain Intervention(s): Monitored during session    Home Living Family/patient expects to be discharged to:: Private residence Living Arrangements: Alone   Type of Home: Mobile home Home Access: Stairs to enter Entrance Stairs-Rails: Doctor, general practice of Steps: 4   Home Layout: One level Home Equipment: Pharmacist, hospital (2 wheels);Rollator (4 wheels);Cane - single point;BSC/3in1 Additional Comments: has a friend that can come help if needed    Prior Function Prior Level of  Function : Independent/Modified Independent;Working/employed;Driving             Mobility Comments: 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); uses cane when going to bathroom or steadys himself on the wall       Extremity/Trunk Assessment   Upper Extremity Assessment Upper Extremity Assessment: Generalized weakness;LUE deficits/detail;RUE deficits/detail RUE Deficits / Details: reports history of bilateral shoulder pain R>L though moving [facility] for function today, just fatigued with weight bearing at times with RW LUE Deficits / Details: reports history of bilateral shoulder pain R>L though moving [facility] for function today, just fatigued with weight bearing at times with RW    Lower Extremity Assessment Lower Extremity Assessment: RLE deficits/detail;LLE deficits/detail RLE Deficits / Details: ROM/strength grossly WFL, reports significant peripheral neuropathy RLE Sensation: history of peripheral neuropathy LLE Deficits / Details: ROM/strength grossly WFL, reports significant peripheral neuropathy LLE Sensation: history of peripheral neuropathy    Cervical / Trunk Assessment Cervical / Trunk Assessment: Kyphotic  Communication        Cognition Arousal: Alert Behavior During Therapy: WFL for tasks assessed/performed   PT - Cognitive impairments: No apparent impairments                         Following commands: Intact       Cueing       General Comments General comments (skin integrity, edema, etc.): SpO2 on 2L with ambulation 93%, pt stopped x 1 stating felt it was low though no monitor to check; reports no O2 at home    Exercises     Assessment/Plan    PT Assessment Patient needs continued PT services  PT Problem List Decreased strength;Decreased mobility;Decreased activity tolerance;Decreased balance;Decreased knowledge of use of DME;Pain;Decreased safety awareness       PT Treatment Interventions DME instruction;Therapeutic exercise;Gait  training;Stair training;Functional mobility training;Therapeutic activities;Patient/family education    PT Goals (Current goals can be found in the Care Plan section)  Acute Rehab PT Goals Patient Stated Goal: to return home, 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 PT Goal Formulation: With patient Time For Goal Achievement: 01/26/24 Potential to Achieve Goals: Good    Frequency Min 3X/week     Co-evaluation               AM-PAC PT "6 Clicks" Mobility  Outcome Measure Help needed turning from your back to your side while in a flat bed without using bedrails?: None Help needed moving from lying on your back to sitting on the side of a flat bed without using bedrails?: None Help needed moving to and from a bed to a chair (including a wheelchair)?: A Little Help needed standing up from a chair using your arms (e.g., wheelchair or bedside chair)?: None Help needed to walk in hospital room?: A Little Help needed climbing 3-5 steps with a railing? : A Little 6 Click Score: 21    End of Session Equipment Utilized During Treatment: Gait belt;Oxygen Activity Tolerance:  Patient tolerated treatment well Patient left: in chair;with call bell/phone within reach   PT Visit Diagnosis: Difficulty in walking, not elsewhere classified (R26.2);Pain;Muscle weakness (generalized) (M62.81) Pain - Right/Left: Right Pain - part of body:  (abdomen, more on R side)    Time: 1610-9604 PT Time Calculation (min) (ACUTE ONLY): 33 min   Charges:   PT Evaluation $PT Eval Moderate Complexity: 1 Mod PT Treatments $Gait Training: 8-22 mins PT General Charges $$ ACUTE PT VISIT: 1 Visit         Sheran Lawless, PT Acute Rehabilitation Services Office:[phone] 01/19/2024   Elray Mcgregor 01/19/2024, 11:09 AM

## 2024-01-19 NOTE — Progress Notes (Signed)
 2 Days Post-Op   Subjective/Chief Complaint: Productive cough, did get oob yesterday, no other complaints   Objective: Vital signs in last 24 hours: Temp:  [97.5 F (36.4 C)-101 F (38.3 C)] 98.1 F (36.7 C) (04/03 0812) Pulse Rate:  [73-107] 74 (04/03 0827) Resp:  [16-19] 18 (04/03 0827) BP: (91-144)/(25-67) 109/25 (04/03 0812) SpO2:  [91 %-98 %] 95 % (04/03 0827) Last BM Date : 01/17/24  Intake/Output from previous day: 04/02 0701 - 04/03 0700 In: -  Out: 250 [Urine:250] Intake/Output this shift: No intake/output data recorded.  General nad Pulm some wheezes, on oxygen Cv regular Ab soft approp tender  Lab Results:  Recent Labs    01/18/24 0627 01/18/24 2232  WBC 10.2 16.1*  HGB 14.0 14.3  HCT 43.2 43.9  PLT 268 295   BMET Recent Labs    01/18/24 0627 01/18/24 2232  NA 133* 133*  K 4.5 3.8  CL 103 104  CO2 23 18*  GLUCOSE 80 78  BUN 17 21  CREATININE 1.29* 1.57*  CALCIUM 8.4* 8.6*   PT/INR Recent Labs    01/17/24 0633 01/19/24 0700  LABPROT 15.5* 16.3*  INR 1.2 1.3*   ABG No results for input(s): "PHART", "HCO3" in the last 72 hours.  Invalid input(s): "PCO2", "PO2"  Studies/Results: No results found.  Anti-infectives: Anti-infectives (From admission, onward)    Start     Dose/Rate Route Frequency Ordered Stop   01/19/24 0600  piperacillin-tazobactam (ZOSYN) IVPB 3.375 g  Status:  Discontinued        3.375 g 12.5 mL/hr over 240 Minutes Intravenous Every 8 hours 01/19/24 0028 01/19/24 0331   01/19/24 0030  piperacillin-tazobactam (ZOSYN) IVPB 3.375 g  Status:  Discontinued        3.375 g 100 mL/hr over 30 Minutes Intravenous  Once 01/19/24 0023 01/19/24 0327   01/17/24 1500  molnupiravir EUA (LAGEVRIO) capsule 800 mg  Status:  Discontinued        4 capsule Oral 2 times daily 01/17/24 1407 01/17/24 1421   01/17/24 0600  ceFAZolin (ANCEF) IVPB 2g/100 mL premix        2 g 200 mL/hr over 30 Minutes Intravenous On call to O.R. 01/17/24  0546 01/17/24 0754       Assessment/Plan: POD 2 lap chole -continue carb mod diet -coumadin restarted due to VTE history -cards has signed off -therapies to see for eventual dispo  Cough- check labs this am, resp panel negative, will check cxr. With low grade fever and productive cough may need abx but will wait for cxr, I have asked TRH to see as well and appreciate help  NTN- carb mod, will continue iv fluids unti Cr back this am    Emelia Loron 01/19/2024

## 2024-01-19 NOTE — Plan of Care (Signed)
  Problem: Clinical Measurements: Goal: Ability to maintain clinical measurements within normal limits will improve Outcome: Progressing Goal: Will remain free from infection Outcome: Progressing   Problem: Nutrition: Goal: Adequate nutrition will be maintained Outcome: Progressing   Problem: Health Behavior/Discharge Planning: Goal: Ability to manage health-related needs will improve Outcome: Not Progressing   Problem: Activity: Goal: Risk for activity intolerance will decrease Outcome: Not Progressing   Problem: Safety: Goal: Ability to remain free from injury will improve Outcome: Not Progressing

## 2024-01-19 NOTE — Progress Notes (Signed)
 Pt BP this AM was low at 109/25 RN held metoprolol. MD made aware.

## 2024-01-19 NOTE — Consult Note (Addendum)
 Initial Consultation Note   Patient: Tony Zhang:[id number] DOB: 06-24-1946 PCP: System, Provider Not In DOA: 01/17/2024 DOS: the patient was seen and examined on 01/19/2024 Primary service: Emelia Loron, MD  Referring physician: Dr. 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   Assessment and Plan:  Status post laparoscopic cholecystectomy -Per general surgery  Suspected community-acquired pneumonia History of COVID-19 Patient reports having persistent productive cough with reports of having COVID-19 infection 2 to 3 weeks ago.. Labs and noted white blood cell count elevated to 16.1 on 4/2 with repeat check today 11.1.  Chest x-ray noting bibasilar atelectasis or infiltrate.  Blood cultures have been obtained yesterday.  Question possibility of bacterial infection.  Patient appears to be out of the window to benefit from Paxlovid and COVID-19 screening was negative. -Incentive spirometry and flutter valve -Check complete respiratory virus panel -Check urine Legionella and strep -Check procalcitonin -Start empiric antibiotics of Rocephin and azithromycin -Probiotics  Leukocytosis Acute, but noted to be trending down at this time.  Postoperatively noted to trend up to 16.1 when checked yesterday evening, but repeat check 11.1 today.  Could be reactive to recent surgery or possibility of respiratory infection -Recheck CBC tomorrow morning  Acute kidney injury superimposed on chronic kidney disease Acute.  Creatinine 1.6-> 1.29->1.57->1.86 with BUN 27.[name]  Urinalysis did not show significant signs for infection.  Patient did report having some  lower abdominal discomfort.  He has prior history of BPH.  Unclear if symptoms are prerenal or related to possible obstruction. -Strict intake and output -Check bladder scan.  May warrant In-N-Out cath or Foley catheter if found to have significant urinary retention -Check urine creatinine, urine sodium, and urine urea -Recheck kidney function in a.m.  CAD PAD Patient with history of CAD s/p DES to LAD back in 05/2022.  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 -Continue atorvastatin -Resume Pletal once able -Continue Repatha in outpatient setting  Hypertension Blood pressures currently maintained. -Continue metoprolol as tolerated  Heart failure with preserved ejection fraction Patient appears to be failure currently euvolemic at this time. -Strict intake and output -Daily weights -Check BNP in a.m.  S/p TAVR Patient underwent successful TAVR on 11/08/2020.  Echo 12/07/2023 noted EF to be 60 to 65% with moderate LVH, mild RV BAE, normal functioning TAVR     History of DVT/PE on chronic anticoagulation Patient on chronic anticoagulation. -Continue Coumadin per pharmacy   TRH will continue to follow the patient.  HPI: Tony Zhang is a 78 y.o. 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   He reports having a persistent productive cough that began two to three weeks prior to hospital admission, coinciding with a positive COVID-19 test. No fever was experienced, only the cough, which has lingered since then.  He also reports having lower abdominal pain.  No fever. He has four  surgical wounds, which he states are okay.  Postoperatively patient has been noted to have increased white blood cell count up to 16.1 yesterday evening.  Chest x-ray did not note bibasilar atelectasis versus infiltrate present.   Review of Systems: As mentioned in the history of present illness. All other systems reviewed and are negative. Past Medical History:  Diagnosis Date   AAA (abdominal aortic aneurysm) (HCC)    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     CAD (coronary artery disease)    Chronic kidney disease    COPD (chronic obstructive pulmonary disease) (HCC)    Diabetes mellitus without complication (HCC)    DJD (degenerative joint disease)    Dyslipidemia    Dyspnea    Gout    Malignant hypertension    Peripheral vascular disease (HCC)    Pneumonia 08/18/2013   pt. 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 Excell Seltzer and Dr. Laneta Simmers   Severe aortic stenosis    Venous thrombosis    Recurrent   Past Surgical History:  Procedure Laterality Date   APPENDECTOMY     BACK SURGERY     CHOLECYSTECTOMY N/A 01/17/2024   Procedure: LAPAROSCOPIC CHOLECYSTECTOMY WITH ICG;  Surgeon: Emelia Loron, MD;  Location: [facility] OR;  Service: General;  Laterality: N/A;   COLONOSCOPY N/A 04/13/2015   Procedure: COLONOSCOPY;  Surgeon: Carman Ching, MD;  Location: [facility] ENDOSCOPY;  Service: Endoscopy;  Laterality: N/A;   CORONARY STENT INTERVENTION N/A 06/04/2022   Procedure: CORONARY STENT INTERVENTION;  Surgeon: Corky Crafts, MD;  Location: [facility] INVASIVE CV LAB;  Service: Cardiovascular;  Laterality: N/A;   EMPYEMA DRAINAGE N/A 11/25/2014   Procedure: EMPYEMA DRAINAGE;  Surgeon: Delight Ovens, MD;  Location: [facility] OR;  Service:  Thoracic;  Laterality: N/A;   ESOPHAGOGASTRODUODENOSCOPY N/A 04/10/2015   Procedure: ESOPHAGOGASTRODUODENOSCOPY (EGD);  Surgeon: Charlott Rakes, MD;  Location: [facility] ENDOSCOPY;  Service: Endoscopy;  Laterality: N/A;   EYE SURGERY     both eyes, cataracts removed, denies lens implants   FLEXIBLE SIGMOIDOSCOPY N/A 04/12/2015   Procedure: FLEXIBLE SIGMOIDOSCOPY;  Surgeon: Carman Ching, MD;  Location: [facility] ENDOSCOPY;  Service: Endoscopy;  Laterality: N/A;   HERNIA REPAIR     LEFT HEART CATH AND CORONARY ANGIOGRAPHY N/A 06/04/2022   Procedure: LEFT HEART CATH AND CORONARY ANGIOGRAPHY;  Surgeon: Corky Crafts, MD;  Location: [facility] INVASIVE CV LAB;  Service: Cardiovascular;  Laterality: N/A;   RIGHT HEART CATH AND CORONARY ANGIOGRAPHY N/A 09/28/2023   Procedure: RIGHT HEART CATH AND CORONARY ANGIOGRAPHY;  Surgeon: Tonny Bollman, MD;  Location: [facility] INVASIVE CV LAB;  Service: Cardiovascular;  Laterality: N/A;   SHOULDER SURGERY Right    TRANSCATHETER AORTIC VALVE REPLACEMENT,SUBCLAVIAN Left 11/08/2023   Procedure: Transcatheter Aortic Valve Replacement-Subclavian;  Surgeon: Tonny Bollman, MD;  Location: [facility] INVASIVE CV LAB;  Service: Cardiovascular;  Laterality: Left;   TRANSESOPHAGEAL ECHOCARDIOGRAM (CATH LAB) N/A 11/08/2023   Procedure: TRANSESOPHAGEAL ECHOCARDIOGRAM;  Surgeon: Tonny Bollman, MD;  Location: [facility] INVASIVE CV LAB;  Service: Cardiovascular;  Laterality: N/A;   VIDEO ASSISTED THORACOSCOPY Right 11/25/2014   Procedure: VIDEO ASSISTED THORACOSCOPY;  Surgeon: Delight Ovens, MD;  Location: [facility] OR;  Service: Thoracic;  Laterality: Right;   VIDEO BRONCHOSCOPY N/A 11/25/2014   Procedure: VIDEO BRONCHOSCOPY;  Surgeon: Delight Ovens, MD;  Location: [facility] OR;  Service: Thoracic;  Laterality: N/A;   Social History:  reports that he quit smoking about 31 years ago. His smoking use included cigarettes. He has never used smokeless tobacco. He reports that he does not drink alcohol and does not use  drugs.  Allergies  Allergen Reactions   Codeine Hives and Other (See Comments)    Takes benadryl to stop allergic reactions    Family History  Problem Relation Age of Onset   CAD Father 50   Liver cancer Brother    CAD Sister     Prior to Admission medications   Medication Sig Start Date End Date Taking? Authorizing Provider  acetaminophen (TYLENOL) 500 MG tablet Take 500 mg by mouth See admin instructions. Take 500mg  (1 tablet) by mouth three to four times a day.   Yes [provider]  albuterol (VENTOLIN HFA) 108 (90 Base) MCG/ACT inhaler Inhale 2 puffs into the lungs every 4 (four) hours as needed for wheezing or shortness of breath.   Yes [provider]  allopurinol (ZYLOPRIM) 300 MG tablet Take 300 mg by mouth at bedtime.   Yes [provider]  atorvastatin (LIPITOR) 20 MG tablet Take 1 tablet (20 mg total) by mouth daily. 10/05/23  Yes Croitoru, Mihai, MD  budesonide-formoterol (SYMBICORT) 160-4.5 MCG/ACT inhaler Inhale 2 puffs into the lungs in the morning and at bedtime. 02/28/23  Yes Almon Hercules, MD  Choline Fenofibrate (FENOFIBRIC ACID) 135 MG CPDR TAKE 1 TABLET BY MOUTH DAILY 09/26/23  Yes Tonny Bollman, MD  cilostazol (PLETAL) 100 MG tablet Take 100 mg by mouth daily.   Yes [provider]  cyanocobalamin 1000 MCG tablet Take 1 tablet (1,000 mcg total) by mouth daily. 03/03/23  Yes Almon Hercules, MD  empagliflozin (JARDIANCE) 10 MG TABS tablet Take 1 tablet (10 mg total) by mouth daily. 08/25/23  Yes Tonny Bollman, MD  furosemide (LASIX) 20 MG tablet Take 1 tablet (20 mg total) by mouth daily. Patient taking differently: Take 20 mg by mouth daily as needed for fluid or edema. 12/09/23  Yes Janetta Hora, PA-C  gabapentin (NEURONTIN) 600 MG tablet Take 600 mg by mouth 2 (two) times daily. 12/02/23 12/01/24 Yes [provider]  metoprolol succinate (TOPROL-XL) 100 MG 24 hr tablet Take 1 tablet (100 mg total) by mouth daily.  08/25/23  Yes Tonny Bollman, MD  nitroGLYCERIN (NITROSTAT) 0.3 MG SL tablet Place under tongue. May take every 5 minutes up to 3 doses for chest pain. If still having pain after 3 doses, call 911. 08/25/23  Yes Tonny Bollman, MD  ondansetron (ZOFRAN) 8 MG tablet Take 8 mg by mouth 2 (two) times daily as needed for vomiting or nausea. 05/25/22  Yes [provider]  pyridOXINE (B-6) 50 MG tablet Take 1 tablet (50 mg total) by mouth daily. 02/28/23  Yes Almon Hercules, MD  REPATHA SURECLICK 140 MG/ML SOAJ INJECT 140 MGS SUBCUTANEOUSLY EVERY 14 DAYS 11/28/23  Yes Croitoru, Mihai, MD  tamsulosin (FLOMAX) 0.4 MG CAPS capsule Take 0.4 mg by mouth every evening.   Yes [provider]  warfarin (COUMADIN) 2 MG tablet Take 2-4 mg by mouth See admin instructions. Take 2mg  (1 tablet) by mouth on weekdays and 4mg  (2 tablets) on weekends.   Yes [provider]  LAGEVRIO 200 MG CAPS capsule Take 4 capsules by mouth 2 (two) times daily. Patient not taking: Reported on 01/10/2024 12/15/23   [provider]    Physical Exam: Vitals:   01/19/24 0143 01/19/24 0551 01/19/24 0812 01/19/24 0827  BP: 93/67 (!) 121/48 (!) 109/25   Pulse: 85 73 73 74  Resp: 18 17 18 18   Temp: (!) 97.5 F (36.4 C) 97.8 F (36.6 C) 98.1 F (36.7 C)   TempSrc: Oral Oral Oral   SpO2: 98% 98% 96% 95%  Weight:      Height:       Constitutional: Elderly male present with weak cough able to follow commands Eyes: PERRL, lids and conjunctivae normal ENMT: Mucous membranes are moist. Posterior pharynx clear of any exudate or lesions.Normal dentition.  Neck: normal, supple  Respiratory: clear to auscultation bilaterally, no wheezing, no crackles. Normal respiratory effort. No accessory muscle use.  Cardiovascular: Regular rate and rhythm, no murmurs / rubs / gallops. No extremity edema.   Abdomen: Postoperative wounds intact without signs of erythema.  Hepatosplenomegaly. Bowel sounds positive.   Musculoskeletal: no clubbing / cyanosis. No joint deformity upper and lower extremities. Good ROM, no contractures. Normal muscle tone.  Skin: no rashes, lesions, ulcers. No induration Neurologic: CN 2-12 grossly intact. Strength 5/5 in all 4.  Psychiatric: Normal judgment and insight. Alert and oriented x 3. Normal mood.   Data Reviewed:   reviewed labs, imaging, and pertinent records as documented   Family Communication:   Primary team communication:   Thank you very much for involving [place] in the care of your patient.  Author: Clydie Braun, MD 01/19/2024 9:10 AM  For on call review [email or web address].

## 2024-01-20 DIAGNOSIS — Z9049 Acquired absence of other specified parts of digestive tract: Secondary | ICD-10-CM | POA: Diagnosis not present

## 2024-01-20 LAB — GLUCOSE, CAPILLARY
Glucose-Capillary: 104 mg/dL — ABNORMAL HIGH (ref 70–99)
Glucose-Capillary: 69 mg/dL — ABNORMAL LOW (ref 70–99)
Glucose-Capillary: 86 mg/dL (ref 70–99)
Glucose-Capillary: 121 mg/dL — ABNORMAL HIGH (ref 70–99)
Glucose-Capillary: 116 mg/dL — ABNORMAL HIGH (ref 70–99)
Glucose-Capillary: 132 mg/dL — ABNORMAL HIGH (ref 70–99)

## 2024-01-20 LAB — CBC
HCT: 38.5 % — ABNORMAL LOW (ref 39.0–52.0)
Hemoglobin: 12.5 g/dL — ABNORMAL LOW (ref 13.0–17.0)
MCH: 30.7 pg (ref 26.0–34.0)
MCHC: 32.5 g/dL (ref 30.0–36.0)
MCV: 94.6 fL (ref 80.0–100.0)
Platelets: 233 10*3/uL (ref 150–400)
RBC: 4.07 MIL/uL — ABNORMAL LOW (ref 4.22–5.81)
RDW: 16.8 % — ABNORMAL HIGH (ref 11.5–15.5)
WBC: 11.8 10*3/uL — ABNORMAL HIGH (ref 4.0–10.5)
nRBC: 0 % (ref 0.0–0.2)

## 2024-01-20 LAB — BASIC METABOLIC PANEL WITH GFR
Anion gap: 12 (ref 5–15)
BUN: 33 mg/dL — ABNORMAL HIGH (ref 8–23)
CO2: 20 mmol/L — ABNORMAL LOW (ref 22–32)
Calcium: 7.8 mg/dL — ABNORMAL LOW (ref 8.9–10.3)
Chloride: 98 mmol/L (ref 98–111)
Creatinine, Ser: 1.76 mg/dL — ABNORMAL HIGH (ref 0.61–1.24)
GFR, Estimated: 39 mL/min — ABNORMAL LOW (ref >60)
Glucose, Bld: 98 mg/dL (ref 70–99)
Potassium: 3.9 mmol/L (ref 3.5–5.1)
Sodium: 130 mmol/L — ABNORMAL LOW (ref 135–145)

## 2024-01-20 LAB — PROTIME-INR
INR: 1.6 — ABNORMAL HIGH (ref 0.8–1.2)
Prothrombin Time: 19 s — ABNORMAL HIGH (ref 11.4–15.2)

## 2024-01-20 LAB — BRAIN NATRIURETIC PEPTIDE: B Natriuretic Peptide: 426.2 pg/mL — ABNORMAL HIGH (ref 0.0–100.0)

## 2024-01-20 MED ORDER — OXYCODONE HCL 5 MG PO TABS
5.0000 mg | ORAL_TABLET | ORAL | 0 refills | Status: DC | PRN
Start: 2024-01-20 — End: 2024-04-11

## 2024-01-20 MED ORDER — WARFARIN SODIUM 4 MG PO TABS
4.0000 mg | ORAL_TABLET | Freq: Once | ORAL | Status: AC
  Administered 2024-01-20: 4 mg via ORAL
  Filled 2024-01-20: qty 1

## 2024-01-20 MED ORDER — FUROSEMIDE 40 MG PO TABS
40.0000 mg | ORAL_TABLET | Freq: Every day | ORAL | Status: DC
  Administered 2024-01-20 – 2024-01-21 (×2): 40 mg via ORAL
  Filled 2024-01-20 (×2): qty 1

## 2024-01-20 MED ORDER — AZITHROMYCIN 250 MG PO TABS
500.0000 mg | ORAL_TABLET | Freq: Every day | ORAL | Status: DC
  Administered 2024-01-20 – 2024-01-21 (×2): 500 mg via ORAL
  Filled 2024-01-20 (×2): qty 2

## 2024-01-20 NOTE — Progress Notes (Signed)
 3 Days Post-Op   Subjective/Chief Complaint: Cough much better today per patient.  Mobilized yesterday.  Therapies recommended HH PT.  Eating well.  + flatus, no BM.  Denies feeling SOB.  Currently on 2L [place] with sats around 92%.  He says he is "prone to pneumonia"   Objective: Vital signs in last 24 hours: Temp:  [97.4 F (36.3 C)-98.5 F (36.9 C)] 97.8 F (36.6 C) (04/04 0811) Pulse Rate:  [72-89] 80 (04/04 0811) Resp:  [16-18] 18 (04/04 0811) BP: (102-140)/(47-69) 134/49 (04/04 0811) SpO2:  [92 %-97 %] 95 % (04/04 0811) Last BM Date : 01/17/24  Intake/Output from previous day: 04/03 0701 - 04/04 0700 In: 967.2 [P.O.:490; I.V.:477.2] Out: 200 [Urine:200] Intake/Output this shift: Total I/O In: -  Out: 125 [Urine:125]  General nad Pulm CTAB, moving good air.  On 2L O2 Cv regular Ab soft approp tender, incisions c/d/I Ext: no lower extremity edema  Lab Results:  Recent Labs    01/19/24 1005 01/20/24 0405  WBC 11.1* 11.8*  HGB 13.0 12.5*  HCT 40.9 38.5*  PLT 216 233   BMET Recent Labs    01/19/24 1005 01/20/24 0405  NA 132* 130*  K 4.4 3.9  CL 103 98  CO2 18* 20*  GLUCOSE 95 98  BUN 27* 33*  CREATININE 1.86* 1.76*  CALCIUM 8.1* 7.8*   PT/INR Recent Labs    01/19/24 0700 01/20/24 0405  LABPROT 16.3* 19.0*  INR 1.3* 1.6*   ABG No results for input(s): "PHART", "HCO3" in the last 72 hours.  Invalid input(s): "PCO2", "PO2"  Studies/Results: DG Chest 2 View Result Date: 01/19/2024 CLINICAL DATA:  Cough. EXAM: CHEST - 2 VIEW COMPARISON:  Chest radiograph dated 11/16/2023. FINDINGS: Shallow inspiration. Bibasilar atelectasis or infiltrate. No pleural effusion or pneumothorax. Stable cardiac silhouette. Atherosclerotic calcification of the aorta. No acute osseous pathology. IMPRESSION: Bibasilar atelectasis or infiltrate. Electronically Signed   By: Elgie Collard M.D.   On: 01/19/2024 13:11    Anti-infectives: Anti-infectives (From admission,  onward)    Start     Dose/Rate Route Frequency Ordered Stop   01/19/24 1745  azithromycin (ZITHROMAX) 500 mg in sodium chloride 0.9 % 250 mL IVPB        500 mg 250 mL/hr over 60 Minutes Intravenous Every 24 hours 01/19/24 1650     01/19/24 1600  cefTRIAXone (ROCEPHIN) 2 g in sodium chloride 0.9 % 100 mL IVPB        2 g 200 mL/hr over 30 Minutes Intravenous Every 24 hours 01/19/24 1505     01/19/24 0600  piperacillin-tazobactam (ZOSYN) IVPB 3.375 g  Status:  Discontinued        3.375 g 12.5 mL/hr over 240 Minutes Intravenous Every 8 hours 01/19/24 0028 01/19/24 0331   01/19/24 0030  piperacillin-tazobactam (ZOSYN) IVPB 3.375 g  Status:  Discontinued        3.375 g 100 mL/hr over 30 Minutes Intravenous  Once 01/19/24 0023 01/19/24 0327   01/17/24 1500  molnupiravir EUA (LAGEVRIO) capsule 800 mg  Status:  Discontinued        4 capsule Oral 2 times daily 01/17/24 1407 01/17/24 1421   01/17/24 0600  ceFAZolin (ANCEF) IVPB 2g/100 mL premix        2 g 200 mL/hr over 30 Minutes Intravenous On call to O.R. 01/17/24 0546 01/17/24 0754       Assessment/Plan: POD 3 lap chole -continue carb mod diet -coumadin restarted due to VTE history, INR 1.6 -cards has  signed off -therapies rec HH PT, I have written for this but apparently he declined to [facility] yesterday.  His mechanic and his "lady friend" can help him at home he says.  Cough- WBC 11K, AF, resp panel negative, CXR with atelectasis.  -TRH following -BNP up some today, but looks quite euvolemic on exam.  Will defer to medicine if they feel he needs a little lasix -azithromycin/Rocephin for possible CAP  NTN- carb mod, Na 130, Cr 1.76.  BMET in am   Letha Cape 01/20/2024

## 2024-01-20 NOTE — Progress Notes (Signed)
 [facility] Consultation Progress Note  Patient: Tony Zhang EAV:[id number]   PCP: System, Provider Not In DOB: 01/20/1946   DOA: 01/17/2024   DOS: 01/20/2024   Date of Service: the patient was seen and examined on 01/20/2024 Primary service: Emelia Loron, MD   Brief Hospital Course: Patient with PMH of CAD, PAD, CKD 3B, aortic stenosis recent TAVR, HFpEF, DVT PE on Coumadin, HTN, HLD, emphysema presented to the hospital with complaints of elective lap cholecystectomy. Postoperatively cardiology was consulted. Due to worsening cough medical service was consulted currently being treated for possible pneumonia.  Assessment and Plan: Community-acquired pneumonia-bacterial. History of COVID-19. COVID-19 infection to 3 weeks ago. Chest x-ray shows possible infiltrate. Procalcitonin level elevated. Suspected to have bacterial pneumonia and started on IV antibiotic. Blood cultures negative. Will likely continue antibiotic for total 5-day treatment course although suspect that the procalcitonin elevation is most likely secondary to surgical intervention rather than active infection. Recommend incentive spirometry.  Status post lap cholecystectomy. Management per surgery. Incentive spirometry.  AKI on CKD 3B. Baseline creatinine around 1.5. Serum creatinine peaked at 1.86. Monitor for now.  CAD, PAD,.  HLD On Pletal, Lipitor, outpatient Repatha.  Continue.  HTN. Chronic HFpEF. Volume is adequate. BNP elevated although I do not think the patient is needing aggressive diuresis. Will just resume his home Lasix.  History of DVT PE. Remote history. On chronic anticoagulation. On Coumadin. Per cardiology does not require bridge.  Recent TAVR. Outpatient follow-up with cardiology.      We will continue to follow the patient.   Subjective: Continues to have some cough although improving.  Continues to have shortness of breath although improving.  No dizziness.  Pain  limiting ability to take deeper breaths.  Objective: Vitals:   01/20/24 0433 01/20/24 0811 01/20/24 1121 01/20/24 1711  BP: 102/69 (!) 134/49 (!) 134/49 (!) 134/43  Pulse: 89 80 80 76  Resp: 16 18  18   Temp: 98.5 F (36.9 C) 97.8 F (36.6 C)  97.9 F (36.6 C)  TempSrc: Oral Oral  Oral  SpO2: 96% 95%  97%  Weight:      Height:        Clear to auscultation. S1-S2 present. Bowel sound present but No significant edema.  Family Communication: No one at bedside  Data Reviewed: Since last encounter, pertinent lab results CBC and BMP   . I have ordered test including BMP  .   Author: Lynden Oxford, MD  [facility] 01/20/2024  7:19 PM  To reach On-call, Look up on care teams to locate the [facility] team or provider name and reach out to them via secure chat or [email or web address] Between 7PM-7AM, please contact night-coverage. If you still have difficulty reaching the attending provider, please page the [facility] (Director on Call) for [facility] on amion for assistance.

## 2024-01-20 NOTE — Evaluation (Signed)
 Occupational Therapy Evaluation Patient Details Name: Tony Zhang MRN: [id number] DOB: [birth year] Today's Date: 01/20/2024   History of Present Illness   Pt is 78 yo admitted 01/17/24 with positive acute pancreatitis and cholelithiasis  noted on recent MRCP.  Underwent lap chole on 01/17/24.  PMH positive for recent TAVR on 1/21 complicated by ICU stay from 1/24-1/26 for decreased LOC and labile pressures. PMH includes: CAD (NSTEMI with stents placed 8/23), HFrEF, PAD, DVT/PE, HTN, COPD, CKD, neuropathy, and gout.     Clinical Impressions PTA patient independent, working and driving. Admitted for above and presents with problem list below.  He is on 2L O2 during session with Spo2 92-94% with accurate waveform (difficult to assess during activity). He is able to complete bed mobility with modified independence, transfers and in room mobility with supervision, and ADLs with supervision.  Assist required for O2 line mgmt, and will need further training if pt goes home on oxygen.  Pt declining HHOT services, but will recommend at dc and follow acutely to optimize independence and safety.      If plan is discharge home, recommend the following:   A little help with walking and/or transfers;A little help with bathing/dressing/bathroom;Assistance with cooking/housework;Assist for transportation     Functional Status Assessment   Patient has had a recent decline in their functional status and demonstrates the ability to make significant improvements in function in a reasonable and predictable amount of time.     Equipment Recommendations   None recommended by OT     Recommendations for Other Services         Precautions/Restrictions   Precautions Precautions: Fall Precaution/Restrictions Comments: watch O2 Restrictions Weight Bearing Restrictions Per Provider Order: No     Mobility Bed Mobility Overal bed mobility: Modified Independent                   Transfers Overall transfer level: Needs assistance Equipment used: Rolling walker (2 wheels) Transfers: Sit to/from Stand Sit to Stand: Supervision           General transfer comment: up under his own power and confident though supervision for safety and reinforced safety not to get up unaided due to O2 and SpO2 monitor      Balance Overall balance assessment: Needs assistance Sitting-balance support: Feet supported Sitting balance-Leahy Scale: Good Sitting balance - Comments: ADls with supervision   Standing balance support: Bilateral upper extremity supported, During functional activity, Single extremity supported Standing balance-Leahy Scale: Fair Standing balance comment: can stand static no UE support, needs UE's for ambulation                           ADL either performed or assessed with clinical judgement   ADL Overall ADL's : Needs assistance/impaired     Grooming: Supervision/safety;Standing           Upper Body Dressing : Set up;Sitting   Lower Body Dressing: Supervision/safety;Sit to/from stand   Toilet Transfer: Supervision/safety;Ambulation;Rolling walker (2 wheels)           Functional mobility during ADLs: Supervision/safety;Rolling walker (2 wheels)       Vision   Vision Assessment?: No apparent visual deficits     Perception         Praxis         Pertinent Vitals/Pain Pain Assessment Pain Assessment: 0-10 Pain Score: 6  Pain Location: abdomen Pain Descriptors / Indicators: Aching, Sore Pain Intervention(s): Limited activity within  patient's tolerance, Monitored during session, Repositioned     Extremity/Trunk Assessment Upper Extremity Assessment Upper Extremity Assessment: Generalized weakness (bil shoulder arthritis)   Lower Extremity Assessment Lower Extremity Assessment: Defer to PT evaluation   Cervical / Trunk Assessment Cervical / Trunk Assessment: Kyphotic   Communication  Communication Communication: No apparent difficulties   Cognition Arousal: Alert Behavior During Therapy: WFL for tasks assessed/performed Cognition: No apparent impairments                               Following commands: Intact       Cueing  General Comments   Cueing Techniques: Verbal cues  SpO2 on 2L 94% at rest, 91% after ambulation in room   Exercises     Shoulder Instructions      Home Living Family/patient expects to be discharged to:: Private residence Living Arrangements: Alone Available Help at Discharge: Friend(s);Available PRN/intermittently Type of Home: Mobile home Home Access: Stairs to enter Entrance Stairs-Number of Steps: 4 Entrance Stairs-Rails: Right;Left Home Layout: One level     Bathroom Shower/Tub: Chief Strategy Officer: Standard     Home Equipment: Pharmacist, hospital (2 wheels);Rollator (4 wheels);Cane - single point;BSC/3in1          Prior Functioning/Environment Prior Level of Function : Independent/Modified Independent;Working/employed;Driving             Mobility Comments: 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); uses cane when to the [employer]; furniture walking at home ADLs Comments: independent ADLs, IADLs, stands in shower.    OT Problem List: Decreased strength;Decreased activity tolerance;Impaired balance (sitting and/or standing);Pain;Obesity;Decreased knowledge of precautions;Decreased knowledge of use of DME or AE   OT Treatment/Interventions: Self-care/ADL training;Therapeutic exercise;DME and/or AE instruction;Therapeutic activities;Balance training;Patient/family education      OT Goals(Current goals can be found in the care plan section)   Acute Rehab OT Goals Patient Stated Goal: home OT Goal Formulation: With patient Time For Goal Achievement: 02/03/24 Potential to Achieve Goals: Good   OT Frequency:  Min 2X/week    Co-evaluation              AM-PAC  OT "6 Clicks" Daily Activity     Outcome Measure Help from another person eating meals?: None Help from another person taking care of personal grooming?: A Little Help from another person toileting, which includes using toliet, bedpan, or urinal?: A Little Help from another person bathing (including washing, rinsing, drying)?: A Little Help from another person to put on and taking off regular upper body clothing?: A Little Help from another person to put on and taking off regular lower body clothing?: A Little 6 Click Score: 19   End of Session Equipment Utilized During Treatment: Rolling walker (2 wheels);Oxygen (2L) Nurse Communication: Mobility status;Precautions;Other (comment) (needs alarm box)  Activity Tolerance: Patient tolerated treatment well Patient left: in chair;with call bell/phone within reach;Other (comment) (chair alarm under pt and alarm box not available- RN notified)  OT Visit Diagnosis: Other abnormalities of gait and mobility (R26.89);Muscle weakness (generalized) (M62.81);Pain Pain - part of body:  (abd)                Time: 8119-1478 OT Time Calculation (min): 24 min Charges:  OT General Charges $OT Visit: 1 Visit OT Evaluation $OT Eval Moderate Complexity: 1 Mod OT Treatments $Self Care/Home Management : 8-22 mins  Barry Brunner, OT Acute Rehabilitation Services Office [phone] Secure Chat Preferred  Chancy Milroy 01/20/2024, 9:17 AM

## 2024-01-20 NOTE — Progress Notes (Signed)
 Mobility Specialist Progress Note:   01/20/24 1040  Mobility  Activity Ambulated with assistance in hallway  Level of Assistance Standby assist, set-up cues, supervision of patient - no hands on  Assistive Device Front wheel walker  Distance Ambulated (ft) 225 ft  Activity Response Tolerated well  Mobility Referral Yes  Mobility visit 1 Mobility  Mobility Specialist Start Time (ACUTE ONLY) 1005  Mobility Specialist Stop Time (ACUTE ONLY) 1025  Mobility Specialist Time Calculation (min) (ACUTE ONLY) 20 min   Pre Mobility: 89% SpO2 RA During Mobility: 103  HR , 79%-97% SpO2 RA-2 L  Post Mobility:  89 HR , 93% SpO2 2 L   Pt received in chair, agreeable to mobility. Desat to 79% on RA. Pursed lip breathing encouraged. Pt needing 2 L to keep sats above 89%. Pt c/o slight SOB and lower back pain, otherwise asx throughout. Pt returned to chair with call bell in reach and all needs met.   Leory Plowman  Mobility Specialist Please contact via [employer] office at [phone]

## 2024-01-20 NOTE — Progress Notes (Signed)
 Physical Therapy Treatment Patient Details Name: Tony Zhang MRN: [id number] DOB: 12-11-1945 Today's Date: 01/20/2024   History of Present Illness Pt is 78 yo admitted 01/17/24 with positive acute pancreatitis and cholelithiasis  noted on recent MRCP.  Underwent lap chole on 01/17/24.  PMH positive for recent TAVR on 1/21 complicated by ICU stay from 1/24-1/26 for decreased LOC and labile pressures. PMH includes: CAD (NSTEMI with stents placed 8/23), HFrEF, PAD, DVT/PE, HTN, COPD, CKD, neuropathy, and gout.    PT Comments  Pt admitted with above diagnosis. Pt CGA to supervision for mobility. Spent a lot of session discussing pts need for 2LO2 at rest and 3LO2 with activity which was based on pt desaturation levels when monitored. Pt made aware of these O2 needs.  Pt declining home therapy (which could be helpful to monitor O2).  CM has arranged O2 at home.  Plan is for home today.  Encouraged pt to get a new pulse oximeter and he said he will go to [facility].   Pt currently with functional limitations due to the deficits listed below (see PT Problem List). Pt will benefit from acute skilled PT to increase their independence and safety with mobility to allow discharge.       If plan is discharge home, recommend the following: Assistance with cooking/housework;Assist for transportation;Help with stairs or ramp for entrance   Can travel by private vehicle        Equipment Recommendations  Other (comment) (discussed pt getting new pulse oximeter, O2 being arranged)    Recommendations for Other Services       Precautions / Restrictions Precautions Precautions: Fall Precaution/Restrictions Comments: watch O2 Restrictions Weight Bearing Restrictions Per Provider Order: No     Mobility  Bed Mobility               General bed mobility comments: Pt up in chair    Transfers Overall transfer level: Needs assistance Equipment used: Rolling walker (2 wheels) Transfers: Sit to/from Stand Sit to  Stand: Supervision                Ambulation/Gait Ambulation/Gait assistance: Contact guard assist Gait Distance (Feet): 400 Feet Assistive device: Rolling walker (2 wheels) Gait Pattern/deviations: WFL(Within Functional Limits), Step-through pattern       General Gait Details: Pt originally on 2LO2.  Pt ambulating with RW and needed rest breaks x2 in standing due to SOB. O2 sat dropped to 87% on 2L. PT adjusted supplemental oxygen from 2 L to 3L this solved the issue and pt was able to ambulate back to his room.   Stairs             Wheelchair Mobility     Tilt Bed    Modified Rankin (Stroke Patients Only)       Balance Overall balance assessment: Needs assistance Sitting-balance support: Feet supported, No upper extremity supported Sitting balance-Leahy Scale: Good     Standing balance support: Bilateral upper extremity supported Standing balance-Leahy Scale: Poor Standing balance comment: Can stand statically no UE support howerver needsUE support dynamically.                            Communication Communication Communication: No apparent difficulties  Cognition Arousal: Alert Behavior During Therapy: WFL for tasks assessed/performed   PT - Cognitive impairments: No apparent impairments  Following commands: Intact      Cueing Cueing Techniques: Verbal cues, Visual cues  Exercises General Exercises - Lower Extremity Ankle Circles/Pumps: AROM, Both, 5 reps Long Arc Quad: AROM, Both, 5 reps Hip Flexion/Marching: AROM, 5 reps, Both    General Comments General comments (skin integrity, edema, etc.): SpO2 on 2L 94% at rest on arrival and during ambulation O2  dropped to 87%. PT needed 3L of O2 to maintain O2 sats at 94% with ambulation. Pt appeared to understand that he needs to monitor O2 and wear it at home.  However as PT continuing to discuss O2, pt then stated he didnt need to wear it all the time.    PT reinforced the importance for the pt to use supplemental O2 when he gets discharge in order for pt to maintain appropriate O2 levels to continue to ambulate at home and performing general transfers. Pt was still unsere about taking the supplemental O2 but ageed to get a pulse oxymeter in order to keep track of his SpO2 after d/c.  Pt was educated to use 2L at rest and 3L with activity and pt verbalized understanding.      Pertinent Vitals/Pain Pain Assessment Pain Assessment: 0-10 Pain Score: 6  Pain Location: abdomen Pain Descriptors / Indicators: Aching, Sore Pain Intervention(s): Limited activity within patient's tolerance, Monitored during session    Home Living                          Prior Function            PT Goals (current goals can now be found in the care plan section) Progress towards PT goals: Progressing toward goals    Frequency    Min 3X/week      PT Plan      Co-evaluation              AM-PAC PT "6 Clicks" Mobility   Outcome Measure  Help needed turning from your back to your side while in a flat bed without using bedrails?: None Help needed moving from lying on your back to sitting on the side of a flat bed without using bedrails?: None Help needed moving to and from a bed to a chair (including a wheelchair)?: A Little Help needed standing up from a chair using your arms (e.g., wheelchair or bedside chair)?: None Help needed to walk in hospital room?: A Little Help needed climbing 3-5 steps with a railing? : A Little 6 Click Score: 21    End of Session Equipment Utilized During Treatment: Gait belt;Oxygen Activity Tolerance: Patient limited by fatigue;Patient limited by pain;Patient tolerated treatment well Patient left: in bed;with call bell/phone within reach (sitting EOB) Nurse Communication: Mobility status PT Visit Diagnosis: Difficulty in walking, not elsewhere classified (R26.2) Pain - Right/Left: Right     Time:  1135-1203 PT Time Calculation (min) (ACUTE ONLY): 28 min  Charges:    $Gait Training: 8-22 mins $Therapeutic Exercise: 8-22 mins PT General Charges $$ ACUTE PT VISIT: 1 Visit                     Lesieli Bresee M,PT Acute Rehab Services [phone]    Bevelyn Buckles 01/20/2024, 2:14 PM

## 2024-01-20 NOTE — Progress Notes (Signed)
 Nurse requested Mobility Specialist to perform oxygen saturation test with pt which includes removing pt from oxygen both at rest and while ambulating.  Below are the results from that testing.     Patient Saturations on Room Air at Rest = spO2 89%  Patient Saturations on Room Air while Ambulating = sp02 79%.  Rested and performed pursed lip breathing for 1 minute with sp02 at 82%.  Patient Saturations on 2 Liters of oxygen while Ambulating = sp02 97%  At end of testing pt left in room on 2 Liters of oxygen.  Reported results to nurse.

## 2024-01-20 NOTE — TOC Progression Note (Addendum)
 Transition of Care (TOC) - Progression Note   Spoke to patient again regarding home health , patient declined he does not feel that he needs home health at this time.   Discussed again patient's home oxygen. Patient has home oxygen through [facility] , however he said the portable tank was to big so he sent it back.   He is agreeable to portable oxygen if [facility] can supply a smaller tank.   NCM called [facility] spoke to [place]. They have orders for the patient to be on 1L [place] continuous and documented he declined the portable tank they delivered. NCM asked if he can receive a smaller tank. Christoper Allegra will need an order for POC at a setting of 1. Also a new ambulatory oxygen saturation note .   NCM entered order for PA to sign . And secure chatted nurse, mobility tech and PT asking for note.   As soon as both received Christoper Allegra will submit to insurance and provide patient with an e tank   Patient aware    1027 received order and saturation note . Called [facility] back to notify Tonya. They will deliver tank to patient's room  1100 [facility]  brought patient a smaller portable tank but he refused it. He only wants a [employer]ery . Christoper Allegra has submitted order for same to insurance. Once approved Christoper Allegra will call patient . With patient's insurance plan they cannot dispense portable concentrator without authorization  Patient Details  Name: Tony Zhang MRN: [id number] Date of Birth: 1946-10-18  [facility]) CM/SW Contact  Sequita Wise, Adria Devon, RN Phone Number: 01/20/2024, 10:00 AM  Clinical Narrative:            Expected Discharge Plan and Services                                               Social Determinants of Health (SDOH) Interventions SDOH Screenings   Food Insecurity: No Food Insecurity (01/17/2024)  Housing: Low Risk  (01/17/2024)  Transportation Needs: No Transportation Needs (01/17/2024)  Utilities: Not At Risk (01/17/2024)  Alcohol Screen: Low Risk  (06/04/2022)   Financial Resource Strain: Low Risk  (06/04/2022)  Social Connections: Socially Isolated (01/17/2024)  Tobacco Use: Medium Risk (01/17/2024)    Readmission Risk Interventions    11/24/2023   12:05 PM  Readmission Risk Prevention Plan  Transportation Screening Complete  Home Care Screening Complete  Medication Review (RN CM) Complete

## 2024-01-20 NOTE — Progress Notes (Signed)
 PHARMACY - ANTICOAGULATION CONSULT NOTE  Pharmacy Consult: Warfarin Indication: History of VTEs  Allergies  Allergen Reactions   Codeine Hives and Other (See Comments)    Takes benadryl to stop allergic reactions    Patient Measurements: Height: 6\' 1"  (185.4 cm) Weight: 90.7 kg (200 lb) IBW/kg (Calculated) : 79.9 HEPARIN DW (KG): 90.7  Vital Signs: Temp: 97.8 F (36.6 C) (04/04 0811) Temp Source: Oral (04/04 0811) BP: 134/49 (04/04 0811) Pulse Rate: 80 (04/04 0811)  Labs: Recent Labs    01/18/24 2232 01/19/24 0700 01/19/24 1005 01/20/24 0405  HGB 14.3  --  13.0 12.5*  HCT 43.9  --  40.9 38.5*  PLT 295  --  216 233  LABPROT  --  16.3*  --  19.0*  INR  --  1.3*  --  1.6*  CREATININE 1.57*  --  1.86* 1.76*    Estimated Creatinine Clearance: 39.1 mL/min (A) (by C-G formula based on SCr of 1.76 mg/dL (H)).   Medical History: Past Medical History:  Diagnosis Date   AAA (abdominal aortic aneurysm) (HCC)    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     CAD (coronary artery disease)    Chronic kidney disease    COPD (chronic obstructive pulmonary disease) (HCC)    Diabetes mellitus without complication (HCC)    DJD (degenerative joint disease)    Dyslipidemia    Dyspnea    Gout    Malignant hypertension    Peripheral vascular disease (HCC)    Pneumonia 08/18/2013   pt. 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 Excell Seltzer and Dr. Laneta Simmers   Severe aortic stenosis    Venous thrombosis    Recurrent    Assessment: 62 YOM with gallstone pancreatitis now s/p cholecystectomy on 4/01.  Pharmacy consulted to resume PTA Coumadin for history of recurrent VTEs. Baseline INR sub-therapeutic at 1.2, last Coumadin  dose on 3/26.  No bleeding reported; CBC stable.  Home Coumadin regimen:  2mg  PO daily except 4mg  on Sat/Sun  S/p lap chole 4/01, reported eating 100% of meal 4/03, CBC stable. Allopurinol resumed 4/2 PM however is a home med.  INR subtherapeutic but trending up at 1.6 following 2 doses warfarin 4mg .   Goal of Therapy:  INR 2-3 Monitor platelets by anticoagulation protocol: Yes   Plan:  Give warfarin 4 mg PO x1 dose > consider resuming 2 mg on weekdays once INR trending up Check INR daily while on warfarin Continue to monitor H&H and platelets   Thank you for allowing pharmacy to be a part of this patient's care.  Thelma Barge, PharmD, BCPS Clinical Pharmacist

## 2024-01-21 DIAGNOSIS — Z9049 Acquired absence of other specified parts of digestive tract: Secondary | ICD-10-CM | POA: Diagnosis not present

## 2024-01-21 LAB — CBC
HCT: 37.7 % — ABNORMAL LOW (ref 39.0–52.0)
Hemoglobin: 12.3 g/dL — ABNORMAL LOW (ref 13.0–17.0)
MCH: 31 pg (ref 26.0–34.0)
MCHC: 32.6 g/dL (ref 30.0–36.0)
MCV: 95 fL (ref 80.0–100.0)
Platelets: 250 10*3/uL (ref 150–400)
RBC: 3.97 MIL/uL — ABNORMAL LOW (ref 4.22–5.81)
RDW: 16.6 % — ABNORMAL HIGH (ref 11.5–15.5)
WBC: 10 10*3/uL (ref 4.0–10.5)
nRBC: 0 % (ref 0.0–0.2)

## 2024-01-21 LAB — BASIC METABOLIC PANEL WITH GFR
Anion gap: 8 (ref 5–15)
BUN: 31 mg/dL — ABNORMAL HIGH (ref 8–23)
CO2: 23 mmol/L (ref 22–32)
Calcium: 8.7 mg/dL — ABNORMAL LOW (ref 8.9–10.3)
Chloride: 102 mmol/L (ref 98–111)
Creatinine, Ser: 1.65 mg/dL — ABNORMAL HIGH (ref 0.61–1.24)
GFR, Estimated: 42 mL/min — ABNORMAL LOW (ref >60)
Glucose, Bld: 99 mg/dL (ref 70–99)
Potassium: 4.3 mmol/L (ref 3.5–5.1)
Sodium: 133 mmol/L — ABNORMAL LOW (ref 135–145)

## 2024-01-21 LAB — PROTIME-INR
INR: 1.6 — ABNORMAL HIGH (ref 0.8–1.2)
Prothrombin Time: 19.5 s — ABNORMAL HIGH (ref 11.4–15.2)

## 2024-01-21 LAB — GLUCOSE, CAPILLARY
Glucose-Capillary: 134 mg/dL — ABNORMAL HIGH (ref 70–99)
Glucose-Capillary: 104 mg/dL — ABNORMAL HIGH (ref 70–99)

## 2024-01-21 MED ORDER — BENZONATATE 100 MG PO CAPS: 100.0000 mg | ORAL_CAPSULE | Freq: Three times a day (TID) | ORAL | 0 refills | Status: DC | PRN

## 2024-01-21 MED ORDER — AMOXICILLIN-POT CLAVULANATE 875-125 MG PO TABS: 1.0000 | ORAL_TABLET | Freq: Two times a day (BID) | ORAL | 0 refills | Status: AC

## 2024-01-21 MED ORDER — FUROSEMIDE 20 MG PO TABS: 20.0000 mg | ORAL_TABLET | Freq: Every day | ORAL | Status: DC

## 2024-01-21 MED ORDER — GUAIFENESIN ER 600 MG PO TB12: 600.0000 mg | ORAL_TABLET | Freq: Two times a day (BID) | ORAL | 2 refills | Status: DC

## 2024-01-21 MED ORDER — DEXTROMETHORPHAN POLISTIREX ER 30 MG/5ML PO SUER: 30.0000 mg | Freq: Two times a day (BID) | ORAL | 0 refills | Status: AC

## 2024-01-21 NOTE — Progress Notes (Signed)
 Mobility Specialist Progress Note:   01/21/24 1234  Mobility  Activity Ambulated with assistance in hallway  Level of Assistance Standby assist, set-up cues, supervision of patient - no hands on  Assistive Device Front wheel walker  Distance Ambulated (ft) 500 ft  Activity Response Tolerated well  Mobility Referral Yes  Mobility visit 1 Mobility  Mobility Specialist Start Time (ACUTE ONLY) 1040  Mobility Specialist Stop Time (ACUTE ONLY) 1055  Mobility Specialist Time Calculation (min) (ACUTE ONLY) 15 min   Pre Mobility: 94% SpO2 2 L During Mobility: 83-92% SpO2 3 L Post Mobility: 93% SpO2 2 L  Pt received in bed, agreeable to mobility. Desat to 83% on 3 L. Pt reluctant to pursed lip breathing but willing after encouragement. Sats quickly elevated to 92% after PLB. Pt c/o lower back pain, otherwise asx throughout. Pt left in chair with call bell in reach and all needs met.    Leory Plowman  Mobility Specialist Please contact via [employer] office at [phone]

## 2024-01-21 NOTE — Progress Notes (Signed)
 4 Days Post-Op   Subjective/Chief Complaint:  No new complaints. Waiting on breakfast. Still no BM.   Objective: Vital signs in last 24 hours: Temp:  [97.7 F (36.5 C)-98.2 F (36.8 C)] 97.7 F (36.5 C) (04/05 0851) Pulse Rate:  [72-80] 72 (04/05 1007) Resp:  [17-18] 18 (04/05 0851) BP: (133-148)/(35-81) 133/55 (04/05 1007) SpO2:  [95 %-97 %] 97 % (04/05 0851) Last BM Date : 01/20/24  Intake/Output from previous day: 04/04 0701 - 04/05 0700 In: 480 [P.O.:480] Out: 925 [Urine:925] Intake/Output this shift: No intake/output data recorded.  General nad Pulm CTAB, moving good air.  On 2L O2 Cv regular Ab soft approp tender, incisions c/d/I Ext: no lower extremity edema  Lab Results:  Recent Labs    01/20/24 0405 01/21/24 0602  WBC 11.8* 10.0  HGB 12.5* 12.3*  HCT 38.5* 37.7*  PLT 233 250   BMET Recent Labs    01/20/24 0405 01/21/24 0602  NA 130* 133*  K 3.9 4.3  CL 98 102  CO2 20* 23  GLUCOSE 98 99  BUN 33* 31*  CREATININE 1.76* 1.65*  CALCIUM 7.8* 8.7*   PT/INR Recent Labs    01/20/24 0405 01/21/24 0602  LABPROT 19.0* 19.5*  INR 1.6* 1.6*   ABG No results for input(s): "PHART", "HCO3" in the last 72 hours.  Invalid input(s): "PCO2", "PO2"  Studies/Results: No results found.   Anti-infectives: Anti-infectives (From admission, onward)    Start     Dose/Rate Route Frequency Ordered Stop   01/21/24 0000  amoxicillin-clavulanate (AUGMENTIN) 875-125 MG tablet        1 tablet Oral 2 times daily 01/21/24 1034 01/25/24 2359   01/20/24 1430  azithromycin (ZITHROMAX) tablet 500 mg        500 mg Oral Daily 01/20/24 1331 01/25/24 0959   01/19/24 1745  azithromycin (ZITHROMAX) 500 mg in sodium chloride 0.9 % 250 mL IVPB  Status:  Discontinued        500 mg 250 mL/hr over 60 Minutes Intravenous Every 24 hours 01/19/24 1650 01/20/24 1330   01/19/24 1600  cefTRIAXone (ROCEPHIN) 2 g in sodium chloride 0.9 % 100 mL IVPB        2 g 200 mL/hr over 30  Minutes Intravenous Every 24 hours 01/19/24 1505     01/19/24 0600  piperacillin-tazobactam (ZOSYN) IVPB 3.375 g  Status:  Discontinued        3.375 g 12.5 mL/hr over 240 Minutes Intravenous Every 8 hours 01/19/24 0028 01/19/24 0331   01/19/24 0030  piperacillin-tazobactam (ZOSYN) IVPB 3.375 g  Status:  Discontinued        3.375 g 100 mL/hr over 30 Minutes Intravenous  Once 01/19/24 0023 01/19/24 0327   01/17/24 1500  molnupiravir EUA (LAGEVRIO) capsule 800 mg  Status:  Discontinued        4 capsule Oral 2 times daily 01/17/24 1407 01/17/24 1421   01/17/24 0600  ceFAZolin (ANCEF) IVPB 2g/100 mL premix        2 g 200 mL/hr over 30 Minutes Intravenous On call to O.R. 01/17/24 0546 01/17/24 0754       Assessment/Plan: POD 4 lap chole -continue carb mod diet -coumadin restarted due to VTE history, INR 1.6 -cards has signed off -therapies rec HH PT, this has been written for this but apparently he declined to [facility] yesterday.  His mechanic and his "lady friend" can help him at home he says.  Cough- WBC normal this am, AF, resp panel negative, CXR with  atelectasis.  -[facility] following -BNP up some 4/4 - home lasix resumed per [facility] -azithromycin/Rocephin for possible CAP  NTN- carb mod, Na 133, Cr 1.65.    Discussed with [facility] and stable for dc from their standpoint. Home today   Eric Form, [facility] 01/21/2024, 11:35 AM Please see Amion for pager number during day hours 7:00am-4:30pm

## 2024-01-21 NOTE — Progress Notes (Signed)
 [facility] Consultation Progress Note  Patient: Tony Zhang WUJ:[id number]   PCP: Leane Call, PA-C DOB: 23-May-1946   DOA: 01/17/2024   DOS: 01/21/2024   Date of Service: the patient was seen and examined on 01/21/2024 Primary service: No att. 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 Postoperatively cardiology was consulted. Due to worsening cough medical service was consulted currently being treated for possible pneumonia.   Assessment and Plan: Community-acquired pneumonia-bacterial. History of COVID-19. COVID-19 infection to 3 weeks ago. Chest x-ray shows possible infiltrate. Procalcitonin level elevated. Suspected to have bacterial pneumonia and started on IV antibiotic. Blood cultures negative. Will likely continue antibiotic for total 5-day treatment course although suspect that the procalcitonin elevation is most likely secondary to surgical intervention rather than active infection. Recommend incentive spirometry.   Status post lap cholecystectomy. Management per surgery. Incentive spirometry.   AKI on CKD 3B. Baseline creatinine around 1.5. Serum creatinine peaked at 1.86. Monitor for now.  Repeat BMP in 1 week on discharge.   CAD, PAD,.  HLD On Pletal, Lipitor, outpatient Repatha.  Continue.   HTN. Chronic HFpEF. Volume is adequate. BNP elevated although I do not think the patient is needing aggressive diuresis. Will just resume his home Lasix.   History of DVT PE. Remote history. On chronic anticoagulation. On Coumadin. Per cardiology does not require bridge.   Recent TAVR. Outpatient follow-up with cardiology.   Patient can safely be discharged home with close follow-up with PCP as placed in the AVS, if remains in the hospital we will continue to follow the  patient.    Subjective: No acute complaint.  No nausea or vomiting.  Pain well-controlled.  Cough still present but improving.  Objective: Vitals:   01/20/24 1953 01/21/24 0427 01/21/24 0851 01/21/24 1007  BP: 133/81 (!) 148/63 (!) 148/35 (!) 133/55  Pulse: 80 74 73 72  Resp: 18 17 18    Temp: 98 F (36.7 C) 98.2 F (36.8 C) 97.7 F (36.5 C)   TempSrc: Oral Oral Oral   SpO2: 95% 97% 97%   Weight:      Height:        Basal crackles. S1-S2 present Bowel sound present No edema.  Family Communication: No one at bedside  Data Reviewed: Since last encounter, pertinent lab results CBC and BMP   .  Discussed with primary team. Author: Lynden Oxford, MD  [facility] 01/21/2024   To reach On-call, Look up on care teams to locate the [facility] team or provider name and reach out to them via secure chat or [email or web address] Between 7PM-7AM, please contact night-coverage. If you still have difficulty reaching the attending provider, please page the [facility] (Director on Call) for [facility] on amion for assistance.

## 2024-01-21 NOTE — Progress Notes (Signed)
 PHARMACY - ANTICOAGULATION CONSULT NOTE  Pharmacy Consult: Warfarin Indication: History of VTEs  Allergies  Allergen Reactions   Codeine Hives and Other (See Comments)    Takes benadryl to stop allergic reactions    Patient Measurements: Height: 6\' 1"  (185.4 cm) Weight: 90.7 kg (200 lb) IBW/kg (Calculated) : 79.9 HEPARIN DW (KG): 90.7  Vital Signs: Temp: 97.7 F (36.5 C) (04/05 0851) Temp Source: Oral (04/05 0851) BP: 133/55 (04/05 1007) Pulse Rate: 72 (04/05 1007)  Labs: Recent Labs    01/19/24 0700 01/19/24 1005 01/20/24 0405 01/21/24 0602  HGB  --  13.0 12.5* 12.3*  HCT  --  40.9 38.5* 37.7*  PLT  --  216 233 250  LABPROT 16.3*  --  19.0* 19.5*  INR 1.3*  --  1.6* 1.6*  CREATININE  --  1.86* 1.76* 1.65*    Estimated Creatinine Clearance: 41.7 mL/min (A) (by C-G formula based on SCr of 1.65 mg/dL (H)).   Medical History: Past Medical History:  Diagnosis Date   AAA (abdominal aortic aneurysm) (HCC)    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     CAD (coronary artery disease)    Chronic kidney disease    COPD (chronic obstructive pulmonary disease) (HCC)    Diabetes mellitus without complication (HCC)    DJD (degenerative joint disease)    Dyslipidemia    Dyspnea    Gout    Malignant hypertension    Peripheral vascular disease (HCC)    Pneumonia 08/18/2013   pt. 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 Excell Seltzer and Dr. Laneta Simmers   Severe aortic stenosis    Venous thrombosis    Recurrent    Assessment: 75 YOM with gallstone pancreatitis now s/p cholecystectomy on 4/01.  Pharmacy consulted to resume PTA Coumadin for history of recurrent VTEs. Baseline INR sub-therapeutic at 1.2, last Coumadin  dose on 3/26.  No bleeding reported; CBC stable.  Home Coumadin regimen:  2mg  PO daily except 4mg  on Sat/Sun  INR remains 1.6 today, subtherapeutic. Patient was started on azithromycin yesterday which will increase his INR. Would favor returning to home regimen on discharge. Charted eating 100% of meals. No report of bleeding on chart review.   Goal of Therapy:  INR 2-3 Monitor platelets by anticoagulation protocol: Yes   Plan:  Give warfarin 2mg  on 4/5 and 4/6 with recent azithromycin administration Return to previous home regimen on Monday 4/7 with INR follow up next week Check INR daily while on warfarin Continue to monitor H&H and platelets    Thank you for allowing pharmacy to be a part of this patient's care.  Romie Minus, PharmD PGY1 Pharmacy Resident  Please check AMION for all [facility] Pharmacy phone numbers After 10:00 PM, call Main Pharmacy [phone]

## 2024-01-21 NOTE — TOC Transition Note (Signed)
 Transition of Care [facility]) - Discharge Note   Patient Details  Name: Tony Zhang MRN: [id number] Date of Birth: May 08, 1946  [facility] of [facility]) CM/SW Contact:  Ronny Bacon, RN Phone Number: 01/21/2024, 11:01 AM   Clinical Narrative:   Secure message from provider regarding patient ready for discharge and oxygen tank delivery status. Per previous RNCM note, patient refused portable oxygen tank delivered from [employer] yesterday. Christoper Allegra still has not received insurance authorization for portable tank patient wants. Discharging provider aware.     Final next level of care: Home/Self Care Barriers to Discharge: No Barriers Identified   Patient Goals and CMS Choice            Discharge Placement                       Discharge Plan and Services Additional resources added to the After Visit Summary for                                       Social Drivers of Health (SDOH) Interventions SDOH Screenings   Food Insecurity: No Food Insecurity (01/17/2024)  Housing: Low Risk  (01/17/2024)  Transportation Needs: No Transportation Needs (01/17/2024)  Utilities: Not At Risk (01/17/2024)  Alcohol Screen: Low Risk  (06/04/2022)  Financial Resource Strain: Low Risk  (06/04/2022)  Social Connections: Socially Isolated (01/17/2024)  Tobacco Use: Medium Risk (01/17/2024)     Readmission Risk Interventions    11/24/2023   12:05 PM  Readmission Risk Prevention Plan  Transportation Screening Complete  Home Care Screening Complete  Medication Review (RN CM) Complete

## 2024-01-23 LAB — CULTURE, BLOOD (ROUTINE X 2)
Culture: NO GROWTH
Culture: NO GROWTH
Special Requests: ADEQUATE
Special Requests: ADEQUATE

## 2024-01-25 NOTE — Discharge Summary (Signed)
 [facility] Discharge Summary   Patient ID: Tony Zhang MRN: [id number] DOB/AGE: [birth year] 78 y.o.  Admit date: 01/17/2024 Discharge date: 01/21/2024  Admitting Diagnosis: S/P laparoscopic cholecystectomy [Z90.49]  Discharge Diagnosis S/P laparoscopic cholecystectomy [Z90.49]   Consultants [facility]iology  Imaging: No results found.  Procedures Dr. Dwain Sarna (01/17/24) - Laparoscopic Cholecystectomy  Hospital Course:  78 y.o. male who presented to [facility] for planned cholecystectomy. Patient was admitted and underwent procedure listed above.  Tolerated procedure well and was transferred to the floor. [facility] was consulted for assistance with medical management and cardiology given his cardiac comorbidities. Diet was advanced as tolerated.  His coumadin was resumed post op. Therapies were consulted. Due to a cough he was started on antibiotics for possible CAP which he will finish at home. On POD4, the patient was voiding well, tolerating diet, ambulating well, pain well controlled, vital signs stable, incisions c/d/i and felt stable for discharge home.  Patient will follow up in our office in 3 weeks and knows to call with questions or concerns.   I or a member of my team have reviewed this patient in the Controlled Substance Database.  Allergies as of 01/21/2024       Reactions   Codeine Hives, Other (See Comments)   Takes benadryl to stop allergic reactions        Medication List     TAKE these medications    acetaminophen 500 MG tablet Commonly known as: TYLENOL Take 500 mg by mouth See admin instructions. Take 500mg  (1 tablet) by mouth three to four times a day.   albuterol 108 (90 Base) MCG/ACT inhaler Commonly known as: VENTOLIN HFA Inhale 2 puffs into the lungs every 4 (four) hours as needed for wheezing or shortness of breath.   allopurinol 300 MG tablet Commonly known as: ZYLOPRIM Take 300 mg by mouth at bedtime.    amoxicillin-clavulanate 875-125 MG tablet Commonly known as: AUGMENTIN Take 1 tablet by mouth 2 (two) times daily for 4 days.   atorvastatin 20 MG tablet Commonly known as: LIPITOR Take 1 tablet (20 mg total) by mouth daily.   benzonatate 100 MG capsule Commonly known as: Tessalon Perles Take 1 capsule (100 mg total) by mouth 3 (three) times daily as needed for cough.   budesonide-formoterol 160-4.5 MCG/ACT inhaler Commonly known as: Symbicort Inhale 2 puffs into the lungs in the morning and at bedtime.   cilostazol 100 MG tablet Commonly known as: PLETAL Take 100 mg by mouth daily.   cyanocobalamin 1000 MCG tablet Take 1 tablet (1,000 mcg total) by mouth daily.   dextromethorphan 30 MG/5ML liquid Commonly known as: Delsym Take 5 mLs (30 mg total) by mouth 2 (two) times daily for 7 days.   empagliflozin 10 MG Tabs tablet Commonly known as: JARDIANCE Take 1 tablet (10 mg total) by mouth daily.   Fenofibric Acid 135 MG Cpdr TAKE 1 TABLET BY MOUTH DAILY   furosemide 20 MG tablet Commonly known as: LASIX Take 1 tablet (20 mg total) by mouth daily. What changed:  when to take this reasons to take this   gabapentin 600 MG tablet Commonly known as: NEURONTIN Take 600 mg by mouth 2 (two) times daily.   guaiFENesin 600 MG 12 hr tablet Commonly known as: Mucinex Take 1 tablet (600 mg total) by mouth 2 (two) times daily.   Lagevrio 200 MG Caps capsule Generic drug: molnupiravir EUA Take 4 capsules by mouth 2 (two) times daily.   metoprolol succinate 100  MG 24 hr tablet Commonly known as: TOPROL-XL Take 1 tablet (100 mg total) by mouth daily.   nitroGLYCERIN 0.3 MG SL tablet Commonly known as: NITROSTAT Place under tongue. May take every 5 minutes up to 3 doses for chest pain. If still having pain after 3 doses, call 911.   ondansetron 8 MG tablet Commonly known as: ZOFRAN Take 8 mg by mouth 2 (two) times daily as needed for vomiting or nausea.   oxyCODONE 5 MG  immediate release tablet Commonly known as: Oxy IR/ROXICODONE Take 1 tablet (5 mg total) by mouth every 4 (four) hours as needed for moderate pain (pain score 4-6).   pyridOXINE 50 MG tablet Commonly known as: B-6 Take 1 tablet (50 mg total) by mouth daily.   Repatha SureClick 140 MG/ML Soaj Generic drug: Evolocumab INJECT 140 MGS SUBCUTANEOUSLY EVERY 14 DAYS   tamsulosin 0.4 MG Caps capsule Commonly known as: FLOMAX Take 0.4 mg by mouth every evening.   warfarin 2 MG tablet Commonly known as: COUMADIN Take as directed. If you are unsure how to take this medication, talk to your nurse or doctor. Original instructions: Take 2-4 mg by mouth See admin instructions. Take 2mg  (1 tablet) by mouth on weekdays and 4mg  (2 tablets) on weekends.          Follow-up Information     Emelia Loron, MD Follow up in 3 week(s).   Specialty: General Surgery Contact information: [address] [place] [address] [phone]         Leane Call, [place]. Schedule an appointment as soon as possible for a visit in 1 week(s).   Specialty: Physician Assistant Why: with BMP lab to look at kidney and electrolytes, with CBC lab to look at blood counts Contact information: [address] [place] [address] [phone]         coumadin clinic. Schedule an appointment as soon as possible for a visit in 1 week(s).   Why: for INR check                Signed: Eric Form , [facility] 01/25/2024, 11:44 AM Please see Amion for pager number during day hours 7:00am-4:30pm

## 2024-01-27 ENCOUNTER — Other Ambulatory Visit: Payer: Self-pay | Admitting: Cardiovascular Disease

## 2024-02-08 ENCOUNTER — Encounter: Payer: Self-pay | Admitting: Cardiovascular Disease

## 2024-02-08 ENCOUNTER — Ambulatory Visit: Payer: Medicare Other | Attending: Cardiovascular Disease | Admitting: Cardiovascular Disease

## 2024-02-08 VITALS — BP 172/82 | HR 83 | Ht 73.0 in | Wt 212.0 lb

## 2024-02-08 DIAGNOSIS — N1832 Chronic kidney disease, stage 3b: Secondary | ICD-10-CM

## 2024-02-08 DIAGNOSIS — E7841 Elevated Lipoprotein(a): Secondary | ICD-10-CM

## 2024-02-08 DIAGNOSIS — I10 Essential (primary) hypertension: Secondary | ICD-10-CM

## 2024-02-08 DIAGNOSIS — E782 Mixed hyperlipidemia: Secondary | ICD-10-CM

## 2024-02-08 DIAGNOSIS — Z8739 Personal history of other diseases of the musculoskeletal system and connective tissue: Secondary | ICD-10-CM

## 2024-02-08 DIAGNOSIS — I739 Peripheral vascular disease, unspecified: Secondary | ICD-10-CM | POA: Diagnosis not present

## 2024-02-08 DIAGNOSIS — I2510 Atherosclerotic heart disease of native coronary artery without angina pectoris: Secondary | ICD-10-CM

## 2024-02-08 DIAGNOSIS — I7409 Other arterial embolism and thrombosis of abdominal aorta: Secondary | ICD-10-CM | POA: Diagnosis not present

## 2024-02-08 DIAGNOSIS — Z86718 Personal history of other venous thrombosis and embolism: Secondary | ICD-10-CM

## 2024-02-08 DIAGNOSIS — I5032 Chronic diastolic (congestive) heart failure: Secondary | ICD-10-CM

## 2024-02-08 DIAGNOSIS — Z952 Presence of prosthetic heart valve: Secondary | ICD-10-CM

## 2024-02-08 DIAGNOSIS — I7121 Aneurysm of the ascending aorta, without rupture: Secondary | ICD-10-CM | POA: Diagnosis not present

## 2024-02-08 DIAGNOSIS — Q268 Other congenital malformations of great veins: Secondary | ICD-10-CM

## 2024-02-08 DIAGNOSIS — G629 Polyneuropathy, unspecified: Secondary | ICD-10-CM

## 2024-02-08 DIAGNOSIS — Z8719 Personal history of other diseases of the digestive system: Secondary | ICD-10-CM

## 2024-02-08 MED ORDER — LOSARTAN POTASSIUM 50 MG PO TABS: 50.0000 mg | ORAL_TABLET | Freq: Every day | ORAL | 3 refills | Status: DC

## 2024-02-08 MED ORDER — FUROSEMIDE 20 MG PO TABS: 20.0000 mg | ORAL_TABLET | Freq: Every day | ORAL | 3 refills | Status: DC | PRN

## 2024-02-08 NOTE — Progress Notes (Signed)
 Cardiology Office Note:    Date:  02/08/2024   ID:  Tony Zhang, Tony Zhang 05/08/1946, MRN [id number]  PCP:  Spero Dye, PA-C   [facility] Providers Cardiologist:  Luana Rumple, MD     Referring MD: No ref. provider found (retired, now sees Dr "Synetta Eves" in [place]).  No chief complaint on file.   History of Present Illness:    Tony Zhang is a 78 y.o. 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    After undergoing TAVR from a left subclavian approach he had a very protracted recovery since he developed gallstone pancreatitis.  This led to a long hospitalization.  He eventually was discharged and returned on April 1 for elective laparoscopic cholecystectomy, from which she has recovered well.  He just feels extremely tired and does not feel like he is fully recovered from these events.  He has not had shortness of breath and denies palpitations, dizziness, syncope or chest pain.  Follow-up echo 12/07/2023 showed normal left ventricular systolic function and normal function of the TAVR prosthesis. E/e' ratio suggested mildly elevated left atrial filling pressures and he did have mild pulmonary hypertension.  He has not had any bleeding problems.  His INR has remained slightly subtherapeutic on warfarin, which is prescribed for his recurrent venous thromboembolic events.  From a metabolic point of view he is doing well.  His creatinine has  returned to his baseline, most recently 1.65.  He is not anemic with a hemoglobin of 12.3.  Electrolytes are normal.  Blood sugar control is excellent with a hemoglobin A1c of 5.4%.  His most recent LDL cholesterol was 31 and the triglycerides were nominally elevated at 172.  Several of his antihypertensive medications were stopped during the period of acute kidney injury and pancreatitis.  He had been taking losartan  as well as spironolactone .  His blood pressure today is markedly elevated.   Past Medical History:  Diagnosis Date   AAA (abdominal aortic aneurysm) (HCC)    Blood dyscrasia    followed by Dr. Scherrie Curt, for clotting issue, has been on Coumadin  for about 20 yrs.     CAD (coronary artery disease)    Chronic kidney disease    COPD (chronic obstructive pulmonary disease) (HCC)    Diabetes mellitus without complication (HCC)    DJD (degenerative joint disease)    Dyslipidemia    Dyspnea    Gout    Malignant hypertension    Peripheral vascular disease (HCC)    Pneumonia 08/18/2013   pt. 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 Arlester Ladd and Dr. Sherene Dilling   Severe aortic stenosis    Venous thrombosis    Recurrent    Past Surgical History:  Procedure Laterality Date   APPENDECTOMY     BACK SURGERY     CHOLECYSTECTOMY N/A 01/17/2024   Procedure: LAPAROSCOPIC  CHOLECYSTECTOMY WITH ICG;  Surgeon: Enid Harry, MD;  Location: [facility] OR;  Service: General;  Laterality: N/A;   COLONOSCOPY N/A 04/13/2015   Procedure: COLONOSCOPY;  Surgeon: Jolinda Necessary, MD;  Location: [facility] ENDOSCOPY;  Service: Endoscopy;  Laterality: N/A;   CORONARY STENT INTERVENTION N/A 06/04/2022   Procedure: CORONARY STENT INTERVENTION;  Surgeon: Lucendia Rusk, MD;  Location: [name] INVASIVE CV LAB;  Service:  Cardiovascular;  Laterality: N/A;   EMPYEMA DRAINAGE N/A 11/25/2014   Procedure: EMPYEMA DRAINAGE;  Surgeon: Norita Beauvais, MD;  Location: [facility] OR;  Service: Thoracic;  Laterality: N/A;   ESOPHAGOGASTRODUODENOSCOPY N/A 04/10/2015   Procedure: ESOPHAGOGASTRODUODENOSCOPY (EGD);  Surgeon: Baldo Bonds, MD;  Location: [facility] ENDOSCOPY;  Service: Endoscopy;  Laterality: N/A;   EYE SURGERY     both eyes, cataracts removed, denies lens implants   FLEXIBLE SIGMOIDOSCOPY N/A 04/12/2015   Procedure: FLEXIBLE SIGMOIDOSCOPY;  Surgeon: Jolinda Necessary, MD;  Location: [facility] ENDOSCOPY;  Service: Endoscopy;  Laterality: N/A;   HERNIA REPAIR     LEFT HEART CATH AND CORONARY ANGIOGRAPHY N/A 06/04/2022   Procedure: LEFT HEART CATH AND CORONARY ANGIOGRAPHY;  Surgeon: Lucendia Rusk, MD;  Location: [facility] INVASIVE CV LAB;  Service: Cardiovascular;  Laterality: N/A;   RIGHT HEART CATH AND CORONARY ANGIOGRAPHY N/A 09/28/2023   Procedure: RIGHT HEART CATH AND CORONARY ANGIOGRAPHY;  Surgeon: Arnoldo Lapping, MD;  Location: [facility] INVASIVE CV LAB;  Service: Cardiovascular;  Laterality: N/A;   SHOULDER SURGERY Right    TRANSCATHETER AORTIC VALVE REPLACEMENT,SUBCLAVIAN Left 11/08/2023   Procedure: Transcatheter Aortic Valve Replacement-Subclavian;  Surgeon: Arnoldo Lapping, MD;  Location: [facility] INVASIVE CV LAB;  Service: Cardiovascular;  Laterality: Left;   TRANSESOPHAGEAL ECHOCARDIOGRAM (CATH LAB) N/A 11/08/2023   Procedure: TRANSESOPHAGEAL ECHOCARDIOGRAM;  Surgeon: Arnoldo Lapping, MD;  Location: [facility] INVASIVE CV LAB;  Service: Cardiovascular;  Laterality: N/A;   VIDEO ASSISTED THORACOSCOPY Right 11/25/2014   Procedure: VIDEO ASSISTED THORACOSCOPY;  Surgeon: Norita Beauvais, MD;  Location: [facility] OR;  Service: Thoracic;  Laterality: Right;   VIDEO BRONCHOSCOPY N/A 11/25/2014   Procedure: VIDEO BRONCHOSCOPY;  Surgeon: Norita Beauvais, MD;  Location: [facility] OR;  Service: Thoracic;  Laterality: N/A;    Current Medications: Current Meds   Medication Sig   acetaminophen  (TYLENOL ) 500 MG tablet Take 500 mg by mouth See admin instructions. Take 500mg  (1 tablet) by mouth three to four times a day.   albuterol  (VENTOLIN  HFA) 108 (90 Base) MCG/ACT inhaler Inhale 2 puffs into the lungs every 4 (four) hours as needed for wheezing or shortness of breath.   allopurinol  (ZYLOPRIM ) 300 MG tablet Take 300 mg by mouth at bedtime.   atorvastatin  (LIPITOR) 20 MG tablet Take 1 tablet (20 mg total) by mouth daily.   benzonatate  (TESSALON  PERLES) 100 MG capsule Take 1 capsule (100 mg total) by mouth 3 (three) times daily as needed for cough.   budesonide -formoterol  (SYMBICORT ) 160-4.5 MCG/ACT inhaler Inhale 2 puffs into the lungs in the morning and at bedtime.   Choline Fenofibrate  (FENOFIBRIC ACID ) 135 MG CPDR TAKE 1 TABLET BY MOUTH DAILY   cilostazol  (PLETAL ) 100 MG tablet Take 100 mg by mouth daily.   cyanocobalamin  1000 MCG tablet Take 1 tablet (1,000 mcg total) by mouth daily.   empagliflozin  (JARDIANCE ) 10 MG TABS tablet Take 1 tablet (10 mg total) by mouth daily.   furosemide  (LASIX ) 20 MG tablet Take 1 tablet (20 mg total) by mouth daily.   furosemide  (LASIX ) 20 MG tablet Take 1 tablet (20 mg total) by mouth  daily as needed.   gabapentin  (NEURONTIN ) 600 MG tablet Take 600 mg by mouth 2 (two) times daily.   guaiFENesin  (MUCINEX ) 600 MG 12 hr tablet Take 1 tablet (600 mg total) by mouth 2 (two) times daily.   LAGEVRIO  200 MG CAPS capsule Take 4 capsules by mouth 2 (two) times daily.   losartan  (COZAAR ) 50 MG tablet Take 1 tablet (50 mg total) by mouth daily.   metoprolol  succinate (TOPROL -XL) 100 MG 24 hr tablet Take 1 tablet (100 mg total) by mouth daily.   nitroGLYCERIN  (NITROSTAT ) 0.3 MG SL tablet Place under tongue. May take every 5 minutes up to 3 doses for chest pain. If still having pain after 3 doses, call 911.   ondansetron  (ZOFRAN ) 8 MG tablet Take 8 mg by mouth 2 (two) times daily as needed for vomiting or nausea.   oxyCODONE  (OXY  IR/ROXICODONE ) 5 MG immediate release tablet Take 1 tablet (5 mg total) by mouth every 4 (four) hours as needed for moderate pain (pain score 4-6).   pyridOXINE  (B-6) 50 MG tablet Take 1 tablet (50 mg total) by mouth daily.   REPATHA  SURECLICK 140 MG/ML SOAJ INJECT 140 MGS SUBCUTANEOUSLY EVERY 14 DAYS   tamsulosin  (FLOMAX ) 0.4 MG CAPS capsule Take 0.4 mg by mouth every evening.   warfarin (COUMADIN ) 2 MG tablet Take 2-4 mg by mouth See admin instructions. Take 2mg  (1 tablet) by mouth on weekdays and 4mg  (2 tablets) on weekends.     Allergies:   Codeine     Family History: The patient's family history includes CAD in his sister; CAD (age of onset: 62) in his father; Liver cancer in his brother.  ROS:   Please see the history of present illness.     All other systems reviewed and are negative.  EKGs/Labs/Other Studies Reviewed:    The following studies were reviewed today:  CT angiogram of the aorta  1. No evidence of acute aortic syndrome. 2. 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 3. 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 Recommend annual imaging followup by CTA or MRA. This recommendation follows 2010 ACCF/AHA/AATS/ACR/ASA/SCA/SCAI/SIR/STS/SVM Guidelines for the Diagnosis and Management of Patients with Thoracic Aortic Disease. Circulation.2010; 121: Z610-R604. Aortic aneurysm NOS (ICD10-I71.9) 4.  Coronary and aortic Atherosclerosis (ICD10-I70.0).   Echocardiogram 12/07/2023   1. Left ventricular ejection fraction, by estimation, is 60 to 65%. The  left ventricle has normal function. The left ventricle has no regional  wall motion abnormalities. There is moderate left ventricular hypertrophy.  Left ventricular diastolic  parameters are consistent with Grade I diastolic dysfunction (impaired  relaxation).   2. Right ventricular systolic function is normal. The  right ventricular  size is mildly enlarged. There is mildly elevated pulmonary artery  systolic pressure. The estimated right ventricular systolic pressure is  37.1 mmHg.   3. The mitral valve is degenerative. Trivial mitral valve regurgitation.  No evidence of mitral stenosis. Moderate mitral annular calcification.   4. The aortic valve has been repaired/replaced. Aortic valve  regurgitation is not visualized. No aortic stenosis is present. There is a  26 mm Sapien prosthetic (TAVR) valve present in the aortic position.  Procedure Date: 11/08/2023. Echo findings are  consistent with normal structure and function of the aortic valve  prosthesis. Aortic valve area, by VTI measures 2.59 cm. Aortic valve mean  gradient measures 9.1 mmHg. Aortic valve Vmax measures 1.99 m/s. Aortic  valve acceleration time  measures 53 msec.   5. The inferior vena cava is normal in size with greater than 50%  respiratory variability, suggesting right atrial pressure of 3 mmHg.    Personally reviewed ECG from 01/19/2024 which is similar to previous tracing it shows normal sinus rhythm, incomplete right bundle branch block, left anterior fascicular block, QS pattern in the septal leads, normal QTc interval. EKG:    EKG Interpretation Date/Time:    Ventricular Rate:    PR Interval:    QRS Duration:    QT Interval:    QTC Calculation:   R Axis:      Text Interpretation:          Cardiac catheterization 09/28/2023    Ost LAD to Mid LAD lesion is 50% stenosed.   Ost RCA to Prox RCA lesion is 80% stenosed.   Prox RCA lesion is 25% stenosed.   Mid RCA lesion is 70% stenosed.   1st Diag lesion is 50% stenosed.   3rd Mrg lesion is 75% stenosed.   Dist RCA lesion is 50% stenosed.   Non-stenotic Mid LAD lesion was previously treated.   Hemodynamic findings consistent with moderate pulmonary hypertension.   1.  Severe calcific stenosis of the RCA, unchanged from the previous cardiac catheterization  procedure 2.  Continued patency of the stented segment in the mid LAD with moderate nonobstructive plaquing in the proximal vessel 3.  Patent left circumflex with no high-grade stenosis 4.  Patent left subclavian artery with mild calcified stenosis 5.  Calcified aortic valve leaflets with known severe aortic stenosis by noninvasive assessment 6.  Moderate pulmonary hypertension with mean PA pressure 39 mmHg, transpulmonary gradient 20 mmHg, PVR 4.26 Wood units   Recommendations: Continue TAVR evaluation.  Medical therapy for CAD.  Hydration with repeat labs in the morning, overnight observation.   Recent Labs: 02/27/2023: TSH 0.531 11/19/2023: Magnesium  1.7 01/18/2024: ALT 17 01/20/2024: B Natriuretic Peptide 426.2 01/21/2024: BUN 31; Creatinine, Ser 1.65; Hemoglobin 12.3; Platelets 250; Potassium 4.3; Sodium 133  Recent Lipid Panel    Component Value Date/Time   CHOL 98 (L) 09/22/2023 0947   TRIG 172 (H) 09/22/2023 0947   HDL 39 (L) 09/22/2023 0947   CHOLHDL 2.5 09/22/2023 0947   CHOLHDL 3.9 06/03/2022 0257   VLDL 19 06/03/2022 0257   LDLCALC 31 09/22/2023 0947     Risk Assessment/Calculations:      HYPERTENSION CONTROL Vitals:   02/08/24 2058 02/08/24 2101  BP: (!) 168/80 (!) 172/82    The patient's blood pressure is elevated above target today.  In order to address the patient's elevated BP: A new medication was prescribed today.            Physical Exam:    VS:  BP (!) 172/82   Pulse 83   Ht 6\' 1"  (1.854 m)   Wt 212 lb (96.2 kg)   BMI 27.97 kg/m     Wt Readings from Last 3 Encounters:  02/08/24 212 lb (96.2 kg)  01/17/24 200 lb (90.7 kg)  01/10/24 217 lb 8 oz (98.7 kg)      General: Alert, oriented x3, no distress, mildly overweight Head: no evidence of trauma, PERRL, EOMI, no exophtalmos or lid lag, no myxedema, no xanthelasma; normal ears, nose and oropharynx Neck: normal jugular venous pulsations and no hepatojugular reflux; brisk carotid pulses without  delay and no carotid bruits Chest: clear to auscultation, no signs of consolidation by percussion or palpation, normal fremitus, symmetrical and full respiratory excursions Cardiovascular: normal  position and quality of the apical impulse, regular rhythm, normal first and second heart sounds, no murmurs, rubs or gallops Abdomen: no tenderness or distention, no masses by palpation, no abnormal pulsatility or arterial bruits, normal bowel sounds, no hepatosplenomegaly Extremities: no clubbing, cyanosis or edema; 2+ radial, ulnar and brachial pulses bilaterally; 2+ right femoral, posterior tibial and dorsalis pedis pulses; 2+ left femoral, posterior tibial and dorsalis pedis pulses; no subclavian or femoral bruits Neurological: grossly nonfocal Psych: Normal mood and affect    ASSESSMENT:    1. S/P TAVR (transcatheter aortic valve replacement)   2. Coronary artery disease involving native coronary artery of native heart without angina pectoris   3. PAD (peripheral artery disease) (HCC)   4. Chronic distal aortic occlusion (HCC)   5. Aneurysm of ascending aorta without rupture (HCC)   6. Chronic diastolic heart failure, NYHA class 2 (HCC)   7. Essential hypertension   8. Mixed hyperlipidemia   9. Elevated Lp(a)   10. History of DVT (deep vein thrombosis)   11. 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 Stage 3b chronic kidney disease (HCC)   13. Neuropathy   14. History of gout   15. History of acute pancreatitis       PLAN:    In order of problems listed above:  S/P TAVR: Performed via alternative access.  The left subclavian site is a little tender still, but has healed well on physical exam he had a seroma there for a while but it is gone.  Excellent TAVR prosthetic hemodynamics.  He is aware of the need for endocarditis prophylaxis with dental procedures. CAD: Asymptomatic.  Plan to continue medical therapy.  He has a high-grade stenosis in the proximal right  coronary artery that is difficult for percutaneous revascularization.  Since he is asymptomatic we will continue with medical therapy.  No longer on antiplatelet agents since he is fully anticoagulated with warfarin.  Had a drug-eluting stent placed for ACS in the LAD artery in late August 2023.,  Patent by the recent cath PAD/chronic occlusion of the infrarenal abdominal aorta: Has bilateral leg pain that sounds neurogenic rather than vascular.  He does not have claudication, may be because he is fairly sedentary.  Longstanding history of occlusion of the distal abdominal aorta with reconstitution at the level of the common femoral arteries via inferior epigastric arteries.  Based on previous imaging studies does not have a whole lot of disease in the infrapopliteal distribution.   Aortic arch aneurysm/suprarenal AAA: His ascending aortic aneurysm has minimally changed as most recently measured at 4.0 cm on CT before TAVR 11/15/2023 and a suprarenal abdominal aortic aneurysm 3.5 cm.  They both are appropriate for routine imaging follow-up on a yearly basis.  They do not require surgery at this time. CHF: Currently complains of fatigue, otherwise seems to have NYHA functional class II.  Normal left ventricular systolic function.  Appears clinically euvolemic.  He is currently on Jardiance  and metoprolol  succinate as well as a very low-dose of furosemide .  Will resume losartan  50 mg daily.  I asked him to start taking 25 mg daily for the first couple of days then increase to 50 mg daily.  At his next appointment plan to add spironolactone  if renal parameters and blood pressure allow. HTN: His blood pressure is elevated today.  Restarting losartan  and plan to restart spironolactone  at the next appointment. HLP: Excellent response to Repatha , all lipid parameters are acceptable, most recent LDL  cholesterol was 31.  Note that he has a severely elevated LP(a).  May be a candidate for inclisiran.  He may not need  loop diuretics now that he has had a TAVR procedure.  His fatigue may be due to chronic hypovolemia.  Will change the furosemide  to as needed only. History of DVT/PE: INR has been subtherapeutic ever since his TAVR procedure in the episode of acute pancreatitis.  Incidentally noted to have a persistent left-sided inferior vena cava.  On chronic warfarin for over 20 years.  Was seeing Dr. Scherrie Curt in the hematology clinic for hypercoagulable state.  INR is monitored in [place].  Avoid NSAIDs.  Prefer acetaminophen  or tramadol .   CKD3B: AKI during the episode of pancreatitis.  Renal Dorita Garter matures back to baseline creatinine around 1.6, GFR around 40. Neuropathy: "Like walking on glass".  Improved neuropathy in his feet after starting gabapentin . History of gout: Avoid thiazide diuretics. History of gallstone pancreatitis in February 2025, underwent laparoscopic cholecystectomy April 2025            Medication Adjustments/Labs and Tests Ordered: Current medicines are reviewed at length with the patient today.  Concerns regarding medicines are outlined above.  No orders of the defined types were placed in this encounter.  Meds ordered this encounter  Medications   losartan  (COZAAR ) 50 MG tablet    Sig: Take 1 tablet (50 mg total) by mouth daily.    Dispense:  90 tablet    Refill:  3   furosemide  (LASIX ) 20 MG tablet    Sig: Take 1 tablet (20 mg total) by mouth daily as needed.    Dispense:  30 tablet    Refill:  3    Patient Instructions  Medication Instructions:  Your physician has recommended you make the following change in your medication:  CHANGE: Lasix  20 mg daily as needed START: Losartan  50 mg once daily (first week take 25 mg then increase to 50 mg) *If you need a refill on your cardiac medications before your next appointment, please call your pharmacy*  Lab Work: At next visit: BMET If you have labs (blood work) drawn today and your tests are completely normal, you will  receive your results only by: MyChart Message (if you have MyChart) OR A paper copy in the mail If you have any lab test that is abnormal or we need to change your treatment, we will call you to review the results.  Follow-Up: At [facility], you and your health needs are our priority.  As part of our continuing mission to provide you with exceptional heart care, our providers are all part of one team.  This team includes your primary Cardiologist (physician) and Advanced Practice Providers or APPs (Physician Assistants and Nurse Practitioners) who all work together to provide you with the care you need, when you need it.  Your next appointment:   2 month(s)  Provider:   One of our Advanced Practice Providers (APPs): Melita Springer, PA-C  Friddie Jetty, NP Lawana Pray, NP  Theotis Flake, PA-C Lovette Rud, PA-C  [place], PA-C Charles Connor, NP  Ervin Heath, PA-C [place], PA-C  Marlana Silvan, NP    Dayna Dunn, PA-C  Taylor Parcells, PA-C Madison Fountain, NP Marlyse Single, PA-C Callie Goodrich, PA-C  Katlyn West, NP Sheng Haley, PA-C  Evan Williams, PA-C Kathleen Johnson, PA-C Xika Zhao, NP  Then, Luana Rumple, MD will plan to see you again in 6 month(s).    We recommend signing up  for the patient portal called "MyChart".  Sign up information is provided on this After Visit Summary.  MyChart is used to connect with patients for Virtual Visits (Telemedicine).  Patients are able to view lab/test results, encounter notes, upcoming appointments, etc.  Non-urgent messages can be sent to your provider as well.   To learn more about what you can do with MyChart, go to [email or web address].   Other Instructions   1st Floor: - Lobby - Registration  - Pharmacy  - Lab - Cafe  2nd Floor: - PV Lab - Diagnostic Testing (echo, CT, nuclear med)  3rd Floor: - Vacant  4th Floor: - TCTS (cardiothoracic surgery) - AFib Clinic - Structural Heart Clinic - Vascular Surgery   - Vascular Ultrasound  5th Floor: - HeartCare Cardiology (general and EP) - Clinical Pharmacy for coumadin , hypertension, lipid, weight-loss medications, and med management appointments    Valet parking services will be available as well.      Signed, Luana Rumple, MD  02/08/2024 9:12 PM    [facility]

## 2024-02-08 NOTE — Patient Instructions (Signed)
 Medication Instructions:  Your physician has recommended you make the following change in your medication:  CHANGE: Lasix  20 mg daily as needed START: Losartan  50 mg once daily (first week take 25 mg then increase to 50 mg) *If you need a refill on your cardiac medications before your next appointment, please call your pharmacy*  Lab Work: At next visit: BMET If you have labs (blood work) drawn today and your tests are completely normal, you will receive your results only by: MyChart Message (if you have MyChart) OR A paper copy in the mail If you have any lab test that is abnormal or we need to change your treatment, we will call you to review the results.  Follow-Up: At [facility], you and your health needs are our priority.  As part of our continuing mission to provide you with exceptional heart care, our providers are all part of one team.  This team includes your primary Cardiologist (physician) and Advanced Practice Providers or APPs (Physician Assistants and Nurse Practitioners) who all work together to provide you with the care you need, when you need it.  Your next appointment:   2 month(s)  Provider:   One of our Advanced Practice Providers (APPs): Melita Springer, PA-C  Friddie Jetty, NP Lawana Pray, NP  Theotis Flake, PA-C Lovette Rud, PA-C  Phil Campbell, PA-C Charles Connor, NP  Ervin Heath, PA-C [place], PA-C  Marlana Silvan, NP    Dayna Dunn, PA-C  Taylor Parcells, PA-C Madison Fountain, NP Marlyse Single, PA-C Callie Goodrich, PA-C  Katlyn West, NP Sheng Haley, PA-C  Evan Williams, PA-C Kathleen Johnson, PA-C Xika Zhao, NP  Then, Luana Rumple, MD will plan to see you again in 6 month(s).    We recommend signing up for the patient portal called "MyChart".  Sign up information is provided on this After Visit Summary.  MyChart is used to connect with patients for Virtual Visits (Telemedicine).  Patients are able to view lab/test results, encounter notes,  upcoming appointments, etc.  Non-urgent messages can be sent to your provider as well.   To learn more about what you can do with MyChart, go to [email or web address].   Other Instructions   1st Floor: - Lobby - Registration  - Pharmacy  - Lab - Cafe  2nd Floor: - PV Lab - Diagnostic Testing (echo, CT, nuclear med)  3rd Floor: - Vacant  4th Floor: - TCTS (cardiothoracic surgery) - AFib Clinic - Structural Heart Clinic - Vascular Surgery  - Vascular Ultrasound  5th Floor: - [facility] Card[facility] (general and EP) - Clinical Pharmacy for coumadin , hypertension, lipid, weight-loss medications, and med management appointments    Valet parking services will be available as well.

## 2024-02-13 ENCOUNTER — Encounter (HOSPITAL_COMMUNITY): Payer: Self-pay | Admitting: *Deleted

## 2024-02-13 ENCOUNTER — Emergency Department (HOSPITAL_COMMUNITY)

## 2024-02-13 ENCOUNTER — Inpatient Hospital Stay (HOSPITAL_COMMUNITY)
Admission: EM | Admit: 2024-02-13 | Discharge: 2024-02-17 | DRG: 871 | Disposition: A | Discharge status: Home / Self Care | Attending: Internal Medicine | Admitting: Internal Medicine

## 2024-02-13 ENCOUNTER — Other Ambulatory Visit: Payer: Self-pay

## 2024-02-13 DIAGNOSIS — E1122 Type 2 diabetes mellitus with diabetic chronic kidney disease: Secondary | ICD-10-CM | POA: Diagnosis present

## 2024-02-13 DIAGNOSIS — Z79899 Other long term (current) drug therapy: Secondary | ICD-10-CM | POA: Diagnosis not present

## 2024-02-13 DIAGNOSIS — Z7902 Long term (current) use of antithrombotics/antiplatelets: Secondary | ICD-10-CM

## 2024-02-13 DIAGNOSIS — Z1152 Encounter for screening for COVID-19: Secondary | ICD-10-CM | POA: Diagnosis not present

## 2024-02-13 DIAGNOSIS — R5381 Other malaise: Secondary | ICD-10-CM | POA: Diagnosis present

## 2024-02-13 DIAGNOSIS — Z7984 Long term (current) use of oral hypoglycemic drugs: Secondary | ICD-10-CM | POA: Diagnosis not present

## 2024-02-13 DIAGNOSIS — A419 Sepsis, unspecified organism: Principal | ICD-10-CM | POA: Diagnosis present

## 2024-02-13 DIAGNOSIS — E7800 Pure hypercholesterolemia, unspecified: Secondary | ICD-10-CM | POA: Diagnosis present

## 2024-02-13 DIAGNOSIS — E1151 Type 2 diabetes mellitus with diabetic peripheral angiopathy without gangrene: Secondary | ICD-10-CM | POA: Diagnosis present

## 2024-02-13 DIAGNOSIS — Z86711 Personal history of pulmonary embolism: Secondary | ICD-10-CM

## 2024-02-13 DIAGNOSIS — J440 Chronic obstructive pulmonary disease with acute lower respiratory infection: Secondary | ICD-10-CM | POA: Diagnosis present

## 2024-02-13 DIAGNOSIS — J181 Lobar pneumonia, unspecified organism: Secondary | ICD-10-CM | POA: Diagnosis present

## 2024-02-13 DIAGNOSIS — I959 Hypotension, unspecified: Secondary | ICD-10-CM | POA: Diagnosis present

## 2024-02-13 DIAGNOSIS — I5032 Chronic diastolic (congestive) heart failure: Secondary | ICD-10-CM | POA: Diagnosis present

## 2024-02-13 DIAGNOSIS — R6520 Severe sepsis without septic shock: Secondary | ICD-10-CM | POA: Diagnosis present

## 2024-02-13 DIAGNOSIS — R0602 Shortness of breath: Secondary | ICD-10-CM | POA: Diagnosis present

## 2024-02-13 DIAGNOSIS — N1832 Chronic kidney disease, stage 3b: Secondary | ICD-10-CM | POA: Diagnosis present

## 2024-02-13 DIAGNOSIS — Z7901 Long term (current) use of anticoagulants: Secondary | ICD-10-CM

## 2024-02-13 DIAGNOSIS — I2510 Atherosclerotic heart disease of native coronary artery without angina pectoris: Secondary | ICD-10-CM | POA: Diagnosis present

## 2024-02-13 DIAGNOSIS — J189 Pneumonia, unspecified organism: Principal | ICD-10-CM

## 2024-02-13 DIAGNOSIS — I350 Nonrheumatic aortic (valve) stenosis: Secondary | ICD-10-CM | POA: Diagnosis present

## 2024-02-13 DIAGNOSIS — I130 Hypertensive heart and chronic kidney disease with heart failure and stage 1 through stage 4 chronic kidney disease, or unspecified chronic kidney disease: Secondary | ICD-10-CM | POA: Diagnosis present

## 2024-02-13 DIAGNOSIS — Z7951 Long term (current) use of inhaled steroids: Secondary | ICD-10-CM | POA: Diagnosis not present

## 2024-02-13 DIAGNOSIS — Z87891 Personal history of nicotine dependence: Secondary | ICD-10-CM

## 2024-02-13 DIAGNOSIS — J9601 Acute respiratory failure with hypoxia: Secondary | ICD-10-CM | POA: Diagnosis present

## 2024-02-13 DIAGNOSIS — Z8249 Family history of ischemic heart disease and other diseases of the circulatory system: Secondary | ICD-10-CM

## 2024-02-13 DIAGNOSIS — Z9049 Acquired absence of other specified parts of digestive tract: Secondary | ICD-10-CM

## 2024-02-13 DIAGNOSIS — Z86718 Personal history of other venous thrombosis and embolism: Secondary | ICD-10-CM

## 2024-02-13 DIAGNOSIS — Z952 Presence of prosthetic heart valve: Secondary | ICD-10-CM | POA: Diagnosis not present

## 2024-02-13 LAB — CBC WITH DIFFERENTIAL/PLATELET
Abs Immature Granulocytes: 0.04 10*3/uL (ref 0.00–0.07)
Basophils Absolute: 0 10*3/uL (ref 0.0–0.1)
Basophils Relative: 0 %
Eosinophils Absolute: 0.1 10*3/uL (ref 0.0–0.5)
Eosinophils Relative: 1 %
HCT: 47.4 % (ref 39.0–52.0)
Hemoglobin: 14.9 g/dL (ref 13.0–17.0)
Immature Granulocytes: 0 %
Lymphocytes Relative: 3 %
Lymphs Abs: 0.3 10*3/uL — ABNORMAL LOW (ref 0.7–4.0)
MCH: 30.6 pg (ref 26.0–34.0)
MCHC: 31.4 g/dL (ref 30.0–36.0)
MCV: 97.3 fL (ref 80.0–100.0)
Monocytes Absolute: 0.7 10*3/uL (ref 0.1–1.0)
Monocytes Relative: 5 %
Neutro Abs: 11.9 10*3/uL — ABNORMAL HIGH (ref 1.7–7.7)
Neutrophils Relative %: 91 %
Platelets: 297 10*3/uL (ref 150–400)
RBC: 4.87 MIL/uL (ref 4.22–5.81)
RDW: 17.3 % — ABNORMAL HIGH (ref 11.5–15.5)
WBC: 13.1 10*3/uL — ABNORMAL HIGH (ref 4.0–10.5)
nRBC: 0 % (ref 0.0–0.2)

## 2024-02-13 LAB — PROTIME-INR
INR: 2.2 — ABNORMAL HIGH (ref 0.8–1.2)
Prothrombin Time: 24.9 s — ABNORMAL HIGH (ref 11.4–15.2)

## 2024-02-13 LAB — COMPREHENSIVE METABOLIC PANEL WITH GFR
ALT: 14 U/L (ref 0–44)
AST: 33 U/L (ref 15–41)
Albumin: 3.2 g/dL — ABNORMAL LOW (ref 3.5–5.0)
Alkaline Phosphatase: 42 U/L (ref 38–126)
Anion gap: 11 (ref 5–15)
BUN: 22 mg/dL (ref 8–23)
CO2: 20 mmol/L — ABNORMAL LOW (ref 22–32)
Calcium: 9.1 mg/dL (ref 8.9–10.3)
Chloride: 105 mmol/L (ref 98–111)
Creatinine, Ser: 1.84 mg/dL — ABNORMAL HIGH (ref 0.61–1.24)
GFR, Estimated: 37 mL/min — ABNORMAL LOW (ref >60)
Glucose, Bld: 76 mg/dL (ref 70–99)
Potassium: 4.3 mmol/L (ref 3.5–5.1)
Sodium: 136 mmol/L (ref 135–145)
Total Bilirubin: 1.2 mg/dL (ref 0.0–1.2)
Total Protein: 6.4 g/dL — ABNORMAL LOW (ref 6.5–8.1)

## 2024-02-13 LAB — I-STAT CG4 LACTIC ACID, ED: Lactic Acid, Venous: 1.8 mmol/L (ref 0.5–1.9)

## 2024-02-13 LAB — I-STAT VENOUS BLOOD GAS, ED
Acid-base deficit: 3 mmol/L — ABNORMAL HIGH (ref 0.0–2.0)
Bicarbonate: 22.5 mmol/L (ref 20.0–28.0)
Calcium, Ion: 1.2 mmol/L (ref 1.15–1.40)
HCT: 46 % (ref 39.0–52.0)
Hemoglobin: 15.6 g/dL (ref 13.0–17.0)
O2 Saturation: 35 %
Potassium: 4.3 mmol/L (ref 3.5–5.1)
Sodium: 138 mmol/L (ref 135–145)
TCO2: 24 mmol/L (ref 22–32)
pCO2, Ven: 41.2 mmHg — ABNORMAL LOW (ref 44–60)
pH, Ven: 7.346 (ref 7.25–7.43)
pO2, Ven: 22 mmHg — CL (ref 32–45)

## 2024-02-13 LAB — TROPONIN I (HIGH SENSITIVITY)
Troponin I (High Sensitivity): 42 ng/L — ABNORMAL HIGH (ref <18)
Troponin I (High Sensitivity): 41 ng/L — ABNORMAL HIGH (ref <18)

## 2024-02-13 LAB — BRAIN NATRIURETIC PEPTIDE: B Natriuretic Peptide: 277.5 pg/mL — ABNORMAL HIGH (ref 0.0–100.0)

## 2024-02-13 LAB — RESP PANEL BY RT-PCR (RSV, FLU A&B, COVID)  RVPGX2
Influenza A by PCR: NEGATIVE
Influenza B by PCR: NEGATIVE
Resp Syncytial Virus by PCR: NEGATIVE
SARS Coronavirus 2 by RT PCR: NEGATIVE

## 2024-02-13 MED ORDER — ACETAMINOPHEN 325 MG PO TABS
650.0000 mg | ORAL_TABLET | Freq: Once | ORAL | Status: AC
  Administered 2024-02-13: 650 mg via ORAL
  Filled 2024-02-13: qty 2

## 2024-02-13 MED ORDER — METOPROLOL SUCCINATE ER 50 MG PO TB24
100.0000 mg | ORAL_TABLET | Freq: Every day | ORAL | Status: DC
  Administered 2024-02-13 – 2024-02-17 (×4): 100 mg via ORAL
  Filled 2024-02-13 (×5): qty 2

## 2024-02-13 MED ORDER — ACETAMINOPHEN 500 MG PO TABS
500.0000 mg | ORAL_TABLET | Freq: Four times a day (QID) | ORAL | Status: DC | PRN
  Administered 2024-02-13 – 2024-02-17 (×13): 500 mg via ORAL
  Filled 2024-02-13 (×14): qty 1

## 2024-02-13 MED ORDER — LOSARTAN POTASSIUM 50 MG PO TABS
50.0000 mg | ORAL_TABLET | Freq: Every day | ORAL | Status: DC
  Administered 2024-02-13 – 2024-02-17 (×4): 50 mg via ORAL
  Filled 2024-02-13 (×5): qty 1

## 2024-02-13 MED ORDER — SODIUM CHLORIDE 0.9 % IV BOLUS
250.0000 mL | Freq: Once | INTRAVENOUS | Status: AC
  Administered 2024-02-13: 250 mL via INTRAVENOUS

## 2024-02-13 MED ORDER — VANCOMYCIN HCL 2000 MG/400ML IV SOLN
2000.0000 mg | Freq: Once | INTRAVENOUS | Status: AC
  Administered 2024-02-13: 2000 mg via INTRAVENOUS
  Filled 2024-02-13: qty 400

## 2024-02-13 MED ORDER — SODIUM CHLORIDE 0.9 % IV BOLUS (SEPSIS)
1000.0000 mL | Freq: Once | INTRAVENOUS | Status: AC
  Administered 2024-02-13: 1000 mL via INTRAVENOUS

## 2024-02-13 MED ORDER — ONDANSETRON HCL 4 MG/2ML IJ SOLN
4.0000 mg | Freq: Once | INTRAMUSCULAR | Status: AC
Start: 2024-02-13 — End: 2024-02-13
  Administered 2024-02-13: 4 mg via INTRAVENOUS
  Filled 2024-02-13: qty 2

## 2024-02-13 MED ORDER — IPRATROPIUM-ALBUTEROL 0.5-2.5 (3) MG/3ML IN SOLN
3.0000 mL | Freq: Four times a day (QID) | RESPIRATORY_TRACT | Status: DC
  Administered 2024-02-13 – 2024-02-16 (×12): 3 mL via RESPIRATORY_TRACT
  Filled 2024-02-13 (×13): qty 3

## 2024-02-13 MED ORDER — CILOSTAZOL 100 MG PO TABS
100.0000 mg | ORAL_TABLET | Freq: Every day | ORAL | Status: DC
  Administered 2024-02-13 – 2024-02-17 (×5): 100 mg via ORAL
  Filled 2024-02-13 (×5): qty 1

## 2024-02-13 MED ORDER — FUROSEMIDE 40 MG PO TABS
40.0000 mg | ORAL_TABLET | Freq: Every day | ORAL | Status: DC
  Administered 2024-02-13 – 2024-02-17 (×4): 40 mg via ORAL
  Filled 2024-02-13 (×5): qty 1

## 2024-02-13 MED ORDER — FUROSEMIDE 20 MG PO TABS: 20.0000 mg | ORAL_TABLET | Freq: Every day | ORAL | Status: DC

## 2024-02-13 MED ORDER — SODIUM CHLORIDE 0.9 % IV SOLN
500.0000 mg | Freq: Once | INTRAVENOUS | Status: AC
  Administered 2024-02-13: 500 mg via INTRAVENOUS
  Filled 2024-02-13: qty 5

## 2024-02-13 MED ORDER — GABAPENTIN 300 MG PO CAPS
600.0000 mg | ORAL_CAPSULE | Freq: Three times a day (TID) | ORAL | Status: DC
  Administered 2024-02-13 – 2024-02-15 (×7): 600 mg via ORAL
  Filled 2024-02-13 (×7): qty 2

## 2024-02-13 MED ORDER — MIDODRINE HCL 5 MG PO TABS
10.0000 mg | ORAL_TABLET | Freq: Three times a day (TID) | ORAL | Status: DC
  Administered 2024-02-13 – 2024-02-17 (×12): 10 mg via ORAL
  Filled 2024-02-13 (×12): qty 2

## 2024-02-13 MED ORDER — WARFARIN SODIUM 4 MG PO TABS: 4.0000 mg | ORAL_TABLET | ORAL | Status: DC

## 2024-02-13 MED ORDER — WARFARIN SODIUM 2 MG PO TABS
2.0000 mg | ORAL_TABLET | ORAL | Status: DC
  Administered 2024-02-13: 2 mg via ORAL
  Filled 2024-02-13: qty 1

## 2024-02-13 MED ORDER — SODIUM CHLORIDE 0.9 % IV SOLN
1.0000 g | INTRAVENOUS | Status: AC
  Administered 2024-02-14 – 2024-02-16 (×4): 1 g via INTRAVENOUS
  Filled 2024-02-13 (×4): qty 10

## 2024-02-13 MED ORDER — TAMSULOSIN HCL 0.4 MG PO CAPS
0.4000 mg | ORAL_CAPSULE | Freq: Every evening | ORAL | Status: DC
  Administered 2024-02-13 – 2024-02-16 (×4): 0.4 mg via ORAL
  Filled 2024-02-13 (×4): qty 1

## 2024-02-13 MED ORDER — VANCOMYCIN HCL IN DEXTROSE 1-5 GM/200ML-% IV SOLN
1000.0000 mg | Freq: Once | INTRAVENOUS | Status: DC
Start: 2024-02-13 — End: 2024-02-13

## 2024-02-13 MED ORDER — OXYCODONE HCL 5 MG PO TABS
5.0000 mg | ORAL_TABLET | Freq: Once | ORAL | Status: AC
  Administered 2024-02-13: 5 mg via ORAL
  Filled 2024-02-13: qty 1

## 2024-02-13 MED ORDER — GUAIFENESIN ER 600 MG PO TB12
600.0000 mg | ORAL_TABLET | Freq: Two times a day (BID) | ORAL | Status: DC
  Administered 2024-02-13 – 2024-02-17 (×9): 600 mg via ORAL
  Filled 2024-02-13 (×9): qty 1

## 2024-02-13 MED ORDER — OXYCODONE HCL 5 MG PO TABS
5.0000 mg | ORAL_TABLET | ORAL | Status: DC | PRN
  Administered 2024-02-13: 5 mg via ORAL
  Filled 2024-02-13: qty 1

## 2024-02-13 MED ORDER — WARFARIN SODIUM 2 MG PO TABS
2.0000 mg | ORAL_TABLET | ORAL | Status: DC
Start: 2024-02-13 — End: 2024-02-13

## 2024-02-13 MED ORDER — ATORVASTATIN CALCIUM 10 MG PO TABS
20.0000 mg | ORAL_TABLET | Freq: Every day | ORAL | Status: DC
  Administered 2024-02-13 – 2024-02-17 (×5): 20 mg via ORAL
  Filled 2024-02-13 (×5): qty 2

## 2024-02-13 MED ORDER — ENOXAPARIN SODIUM 40 MG/0.4ML IJ SOSY: 40.0000 mg | PREFILLED_SYRINGE | INTRAMUSCULAR | Status: DC

## 2024-02-13 MED ORDER — SODIUM CHLORIDE 0.9 % IV BOLUS
1000.0000 mL | Freq: Once | INTRAVENOUS | Status: AC
  Administered 2024-02-13: 1000 mL via INTRAVENOUS

## 2024-02-13 MED ORDER — CEFTRIAXONE SODIUM 1 G IJ SOLR
1.0000 g | Freq: Once | INTRAMUSCULAR | Status: AC
  Administered 2024-02-13: 1 g via INTRAVENOUS
  Filled 2024-02-13: qty 10

## 2024-02-13 MED ORDER — FLUTICASONE FUROATE-VILANTEROL 200-25 MCG/ACT IN AEPB
1.0000 | INHALATION_SPRAY | Freq: Every day | RESPIRATORY_TRACT | Status: DC
  Administered 2024-02-13 – 2024-02-17 (×5): 1 via RESPIRATORY_TRACT
  Filled 2024-02-13: qty 28

## 2024-02-13 MED ORDER — OXYCODONE HCL 5 MG PO TABS
10.0000 mg | ORAL_TABLET | ORAL | Status: DC | PRN
  Administered 2024-02-13 – 2024-02-17 (×3): 10 mg via ORAL
  Filled 2024-02-13 (×3): qty 2

## 2024-02-13 MED ORDER — AZITHROMYCIN 500 MG PO TABS
500.0000 mg | ORAL_TABLET | Freq: Every day | ORAL | Status: AC
  Administered 2024-02-14 – 2024-02-15 (×2): 500 mg via ORAL
  Filled 2024-02-13 (×2): qty 1

## 2024-02-13 MED ORDER — ONDANSETRON HCL 4 MG PO TABS: 8.0000 mg | ORAL_TABLET | Freq: Two times a day (BID) | ORAL | Status: DC | PRN

## 2024-02-13 MED ORDER — WARFARIN - PHARMACIST DOSING INPATIENT: Freq: Every day | Status: DC

## 2024-02-13 MED ORDER — ALLOPURINOL 300 MG PO TABS
300.0000 mg | ORAL_TABLET | Freq: Every day | ORAL | Status: DC
  Administered 2024-02-13 – 2024-02-16 (×4): 300 mg via ORAL
  Filled 2024-02-13 (×4): qty 1

## 2024-02-13 NOTE — ED Notes (Signed)
 CCMD called.

## 2024-02-13 NOTE — Plan of Care (Signed)

## 2024-02-13 NOTE — ED Triage Notes (Signed)
 Patient presents to ed via [facility] from home , states he lives alone and was fine when going to bed. Woke up with chills at 12 mn c/o n/v states if he lays on his right side it is harder for him to breath. States he had a aortic value replaced 1/25, 2 weeks later had covid and then 2weeks post covid had his gallbladder removed. C/o chronic cough since covid.

## 2024-02-13 NOTE — ED Provider Notes (Signed)
 [place] EMERGENCY DEPARTMENT AT [facility] Provider Note   CSN: [id number] Arrival date & time: 02/13/24  1062     History  Chief Complaint - shortness of breath  Level 5 Caveat due to acuity of condition Tony Zhang is a 78 y.o. male.  The history is provided by the patient.   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  Patient reports he went to bed at his baseline, woke up with a "chill "felt out of breath. EMS was activated, patient was hypoxic to the 70s on room air, that is improved with a nonrebreather Patient reports increasing cough, but no chest pain Patient is had extensive issues this past year including cholecystectomy and TAVR    Home Medications Prior to Admission medications   Medication Sig Start Date End Date Taking? Authorizing Provider  acetaminophen  (TYLENOL ) 500 MG tablet Take 500 mg by mouth See admin instructions. Take 500mg  (1 tablet) by mouth three to four times a day.    [provider]  albuterol  (VENTOLIN  HFA) 108 (90 Base) MCG/ACT inhaler Inhale 2 puffs into the lungs every 4 (four) hours as needed for wheezing or shortness of breath.    [provider]  allopurinol  (ZYLOPRIM ) 300 MG tablet Take 300 mg by mouth at bedtime.    [provider]  atorvastatin  (LIPITOR) 20 MG tablet Take 1 tablet (20 mg total) by mouth daily. 10/05/23   Croitoru, Mihai, MD  benzonatate  (TESSALON  PERLES) 100 MG capsule Take 1 capsule (100 mg total) by mouth 3 (three) times daily as needed for cough. 01/21/24 01/20/25  Patel, Pranav M, MD  budesonide -formoterol  (SYMBICORT ) 160-4.5 MCG/ACT inhaler Inhale 2 puffs into the lungs in the morning and at bedtime. 02/28/23   Gonfa, Taye T, MD  Choline Fenofibrate  (FENOFIBRIC ACID ) 135 MG CPDR TAKE 1 TABLET BY MOUTH DAILY 09/26/23   Arnoldo Lapping, MD  cilostazol  (PLETAL ) 100 MG tablet  Take 100 mg by mouth daily.    [provider]  cyanocobalamin  1000 MCG tablet Take 1 tablet (1,000 mcg total) by mouth daily. 03/03/23   Gonfa, Taye T, MD  empagliflozin  (JARDIANCE ) 10 MG TABS tablet Take 1 tablet (10 mg total) by mouth daily. 08/25/23   Arnoldo Lapping, MD  furosemide  (LASIX ) 20 MG tablet Take 1 tablet (20 mg total) by mouth daily. 01/21/24   Kraig Peru, MD  furosemide  (LASIX ) 20 MG tablet Take 1 tablet (20 mg total) by mouth daily as needed. 02/08/24 05/08/24  Croitoru, Mihai, MD  gabapentin  (NEURONTIN ) 600 MG tablet Take 600 mg by mouth 2 (two) times daily. 12/02/23 12/01/24  [provider]  guaiFENesin  (MUCINEX ) 600 MG 12 hr tablet Take 1 tablet (600 mg total) by mouth 2 (two) times daily. 01/21/24 01/20/25  Kraig Peru, MD  LAGEVRIO  200 MG CAPS capsule Take 4 capsules by mouth 2 (two) times daily. 12/15/23   [provider]  losartan  (COZAAR ) 50 MG tablet Take 1 tablet (50 mg total) by mouth daily. 02/08/24 05/08/24  Croitoru, Mihai, MD  metoprolol  succinate (TOPROL -XL) 100 MG 24 hr tablet Take 1 tablet (100 mg total) by mouth daily. 08/25/23   Arnoldo Lapping, MD  nitroGLYCERIN  (NITROSTAT ) 0.3 MG SL tablet Place under tongue. May take every 5 minutes up to 3 doses for chest pain. If still having pain after 3 doses, call 911. 08/25/23   Arnoldo Lapping, MD  ondansetron  (ZOFRAN )  8 MG tablet Take 8 mg by mouth 2 (two) times daily as needed for vomiting or nausea. 05/25/22   [provider]  oxyCODONE  (OXY IR/ROXICODONE ) 5 MG immediate release tablet Take 1 tablet (5 mg total) by mouth every 4 (four) hours as needed for moderate pain (pain score 4-6). 01/20/24   Enid Harry, MD  pyridOXINE  (B-6) 50 MG tablet Take 1 tablet (50 mg total) by mouth daily. 02/28/23   Theadore Finger, MD  REPATHA  SURECLICK 140 MG/ML SOAJ INJECT 140 MGS SUBCUTANEOUSLY EVERY 14 DAYS 11/28/23   Croitoru, Mihai, MD  tamsulosin  (FLOMAX ) 0.4 MG CAPS capsule Take 0.4 mg by mouth  every evening.    [provider]  warfarin (COUMADIN ) 2 MG tablet Take 2-4 mg by mouth See admin instructions. Take 2mg  (1 tablet) by mouth on weekdays and 4mg  (2 tablets) on weekends.    [provider]      Allergies    Codeine    Review of Systems   Review of Systems  Unable to perform ROS: Acuity of condition    Physical Exam Updated Vital Signs BP (!) 107/49 (BP Location: Right Arm)   Pulse (!) 105   Temp (!) 97.3 F (36.3 C) (Oral)   Resp (!) 24   Ht 1.854 m (6\' 1" )   Wt 91.2 kg   SpO2 97%   BMI 26.52 kg/m  Physical Exam CONSTITUTIONAL: Chronically ill-appearing, mild distress noted HEAD: Normocephalic/atraumatic EYES: EOMI/PERRL ENMT: Mucous membranes moist NECK: supple no meningeal signs CV: S1/S2 noted, no murmurs/rubs/gallops noted LUNGS: Tachypneic, on a nonrebreather, coarse breath sounds bilateral, wheeze bilaterally ABDOMEN: soft, nontender NEURO: Pt is awake/alert/appropriate, moves all extremitiesx4.  No facial droop.   EXTREMITIES: pulses normal/equal, full ROM SKIN: warm, color normal  ED Results / Procedures / Treatments   Labs (all labs ordered are listed, but only abnormal results are displayed) Labs Reviewed  CBC WITH DIFFERENTIAL/PLATELET - Abnormal; Notable for the following components:      Result Value   WBC 13.1 (*)    RDW 17.3 (*)    Neutro Abs 11.9 (*)    Lymphs Abs 0.3 (*)    All other components within normal limits  COMPREHENSIVE METABOLIC PANEL WITH GFR - Abnormal; Notable for the following components:   CO2 20 (*)    Creatinine, Ser 1.84 (*)    Total Protein 6.4 (*)    Albumin 3.2 (*)    GFR, Estimated 37 (*)    All other components within normal limits  PROTIME-INR - Abnormal; Notable for the following components:   Prothrombin Time 24.9 (*)    INR 2.2 (*)    All other components within normal limits  BRAIN NATRIURETIC PEPTIDE - Abnormal; Notable for the following components:   B Natriuretic Peptide  277.5 (*)    All other components within normal limits  I-STAT VENOUS BLOOD GAS, ED - Abnormal; Notable for the following components:   pCO2, Ven 41.2 (*)    pO2, Ven 22 (*)    Acid-base deficit 3.0 (*)    All other components within normal limits  TROPONIN I (HIGH SENSITIVITY) - Abnormal; Notable for the following components:   Troponin I (High Sensitivity) 42 (*)    All other components within normal limits  RESP PANEL BY RT-PCR (RSV, FLU A&B, COVID)  RVPGX2  CULTURE, BLOOD (ROUTINE X 2)  CULTURE, BLOOD (ROUTINE X 2)  I-STAT CG4 LACTIC ACID, ED    EKG EKG Interpretation Date/Time:  Monday February 13 2024 05:17:47 EDT Ventricular Rate:  104 PR Interval:  181 QRS Duration:  87 QT Interval:  310 QTC Calculation: 408 R Axis:   -79  Text Interpretation: Sinus tachycardia Consider left atrial enlargement Inferior infarct, old Anterior infarct, old No significant change since last tracing Confirmed by Eldon Greenland ([id number]) on 02/13/2024 5:23:37 AM  Radiology DG Chest Portable 1 View Result Date: 02/13/2024 CLINICAL DATA:  Respiratory distress EXAM: PORTABLE CHEST 1 VIEW COMPARISON:  01/19/2024 FINDINGS: Airspace disease at the left base with adjacent pleural fluid possible. Background of interstitial coarsening from emphysema. Normal heart size and mediastinal contours. Artifact from EKG leads. IMPRESSION: Pneumonia at the left lung base. Electronically Signed   By: Ronnette Coke M.D.   On: 02/13/2024 05:47    Procedures .Critical Care  Performed by: Eldon Greenland, MD Authorized by: Eldon Greenland, MD   Critical care provider statement:    Critical care time (minutes):  50   Critical care start time:  02/13/2024 5:30 AM   Critical care end time:  02/13/2024 6:20 AM   Critical care time was exclusive of:  Separately billable procedures and treating other patients   Critical care was necessary to treat or prevent imminent or life-threatening deterioration of the following  conditions:  Respiratory failure and sepsis   Critical care was time spent personally by me on the following activities:  Obtaining history from patient or surrogate, examination of patient, development of treatment plan with patient or surrogate, pulse oximetry, ordering and review of radiographic studies, ordering and review of laboratory studies, ordering and performing treatments and interventions, re-evaluation of patient's condition, review of old charts and evaluation of patient's response to treatment   I assumed direction of critical care for this patient from another provider in my specialty: no       Medications Ordered in ED Medications  cefTRIAXone  (ROCEPHIN ) 1 g in sodium chloride  0.9 % 100 mL IVPB (1 g Intravenous New Bag/Given 02/13/24 6045)  azithromycin  (ZITHROMAX ) 500 mg in sodium chloride  0.9 % 250 mL IVPB (has no administration in time range)  vancomycin  (VANCOREADY) IVPB 2000 mg/400 mL (has no administration in time range)  ondansetron  (ZOFRAN ) injection 4 mg (4 mg Intravenous Given 02/13/24 4098)    ED Course/ Medical Decision Making/ A&P Clinical Course as of 02/13/24 0731  Mon Feb 13, 2024  0602 Patient found to have significant pneumonia He has a new oxygen requirement.  Patient will need to be admitted for IV antibiotics.  Sepsis order set has been initiated.  IV antibiotics to be started [DW]  0625 Patient is stable, we are starting to de-escalate his oxygen requirement.  He is protecting his airway.  Will call for admission.  Lactic acid unremarkable at this time [DW]  0644 Patient did have a drop in his blood pressure into the 90s.  Will give IV fluids, but his lactic acid is normal He is awaiting admission [DW]  0724 Blood pressure is improving, awaiting admission [DW]  0729 Signed out to dr Inga Manges with call for admission pending [DW]    Clinical Course User Index [DW] Eldon Greenland, MD                                 Medical Decision Making Risk OTC  drugs. Prescription drug management. Decision regarding hospitalization.   This patient presents to the ED for concern of shortness of breath, this involves an extensive number of  treatment options, and is a complaint that carries with it a high risk of complications and morbidity.  The differential diagnosis includes but is not limited to Acute coronary syndrome, pneumonia, acute pulmonary edema, pneumothorax, acute anemia, pulmonary embolism    Comorbidities that complicate the patient evaluation: Patient's presentation is complicated by their history of CAD, CHF  Social Determinants of Health: Patient's  poor mobility   increases the complexity of managing their presentation  Additional history obtained: Records reviewed previous admission documents and cardiology records  Lab Tests: I Ordered, and personally interpreted labs.  The pertinent results include: Leukocytosis  Imaging Studies ordered: I ordered imaging studies including X-ray chest   I independently visualized and interpreted imaging which showed left lower lobe pneumonia I agree with the radiologist interpretation  Cardiac Monitoring: The patient was maintained on a cardiac monitor.  I personally viewed and interpreted the cardiac monitor which showed an underlying rhythm of:  sinus rhythm  Medicines ordered and prescription drug management: I ordered medication including IV antibiotics for pneumonia Reevaluation of the patient after these medicines showed that the patient    stayed the same  Critical Interventions:   IV antibiotics and admission    Reevaluation: After the interventions noted above, I reevaluated the patient and found that they have :stayed the same  Complexity of problems addressed: Patient's presentation is most consistent with  acute presentation with potential threat to life or bodily function  Disposition: After consideration of the diagnostic results and the patient's response to  treatment,  I feel that the patent would benefit from admission   .           Final Clinical Impression(s) / ED Diagnoses Final diagnoses:  Community acquired pneumonia of left lower lobe of lung  Acute respiratory failure with hypoxia [facility])    Rx / DC Orders ED Discharge Orders     None         Eldon Greenland, MD 02/13/24 [phone]

## 2024-02-13 NOTE — Progress Notes (Signed)
 PHARMACY - ANTICOAGULATION CONSULT NOTE  Pharmacy Consult for warfarin Indication: DVT  Allergies  Allergen Reactions   Codeine Hives and Other (See Comments)    Takes benadryl  to stop allergic reactions    Patient Measurements: Height: 6\' 1"  (185.4 cm) Weight: 91.2 kg (201 lb) IBW/kg (Calculated) : 79.9 HEPARIN  DW (KG): 91.2  Vital Signs: Temp: 98.6 F (37 C) (04/28 0851) Temp Source: Oral (04/28 0851) BP: 134/99 (04/28 0851) Pulse Rate: 96 (04/28 0851)  Labs: Recent Labs    02/13/24 0514 02/13/24 0519 02/13/24 0848  HGB 14.9 15.6  --   HCT 47.4 46.0  --   PLT 297  --   --   LABPROT 24.9*  --   --   INR 2.2*  --   --   CREATININE 1.84*  --   --   TROPONINIHS 42*  --  41*    Estimated Creatinine Clearance: 37.4 mL/min (A) (by C-G formula based on SCr of 1.84 mg/dL (H)).   Medical History: Past Medical History:  Diagnosis Date   AAA (abdominal aortic aneurysm) (HCC)    Blood dyscrasia    followed by Dr. Scherrie Curt, for clotting issue, has been on Coumadin  for about 20 yrs.     CAD (coronary artery disease)    Chronic kidney disease    COPD (chronic obstructive pulmonary disease) (HCC)    Diabetes mellitus without complication (HCC)    DJD (degenerative joint disease)    Dyslipidemia    Dyspnea    Gout    Malignant hypertension    Peripheral vascular disease (HCC)    Pneumonia 08/18/2013   pt. 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 Arlester Ladd and Dr. Sherene Dilling   Severe aortic stenosis    Venous thrombosis    Recurrent    Assessment: 78 y.o. 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   Patient on warfarin PTA - 2 mg on weekdays and 4 mg on weekends.  INR 2.2  Pharmacy consulted for warfarin management.  Will monitor closely due to interaction with Azithro,  Goal of Therapy:  INR 2-3 Monitor platelets by anticoagulation protocol: Yes   Plan:  D/c Enoxaparin  Continue warfarin 2 mg on weekdays and 4 mg on weekends Monitor INR daily given significant drug interaction.  Lovina Ruddle, PharmD, [facility] Clinical Pharmacist Please see AMION for all Pharmacists' Contact Phone Numbers 02/13/2024, 9:54 AM

## 2024-02-13 NOTE — Progress Notes (Signed)
 Gave patient breathing treatment due to difficulty breathing.

## 2024-02-13 NOTE — ED Provider Notes (Signed)
 Received patient in turnover from Dr. Wallis Gun.  Please see their note for further details of Hx, PE.  Briefly patient is a 78 y.o. male with a Shortness of Breath .  Patient found to have pneumonia and hypoxic respiratory failure.  Plan for medical admission.Inga Manges, Genella Kendall, DO 02/13/24 [phone]

## 2024-02-13 NOTE — H&P (Addendum)
 History and Physical    Patient: Tony Zhang:[id number] DOB: 1945/12/14 DOA: 02/13/2024 DOS: the patient was seen and examined on 02/13/2024 PCP: Spero Dye, PA-C  Patient coming from: Home  Chief Complaint:  Chief Complaint  Patient presents with   Shortness of Breath   HPI: Tony Zhang is a 78 y.o. 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   Pt states that he was in his USOH until early this morning 0001 when he was unable to catch his breath. He was lying in his bed, so he moved to his recliner, put on his supplemental O2 and was able to stabilize himself for a few hours; unfortunately, his breathing did not return to baseline so he activated EMS around 0300. Pt denies any recent sick contacts or travel. He also denies eating salt, and cook all of his meals (including canned foods).   In the ED, pt was noted to have LLL CAP and required 10L O2 per [place]; as such, pt was admitted to medicine for ongoing care.  Review of Systems: As mentioned in the history of present illness. All other systems reviewed and are negative.  Past Medical History:  Diagnosis Date   AAA (abdominal aortic aneurysm) (HCC)    Blood dyscrasia    followed by Dr. Scherrie Curt, for clotting issue, has been on Coumadin  for about 20 yrs.     CAD (coronary artery disease)    Chronic kidney disease    COPD (chronic obstructive  pulmonary disease) (HCC)    Diabetes mellitus without complication (HCC)    DJD (degenerative joint disease)    Dyslipidemia    Dyspnea    Gout    Malignant hypertension    Peripheral vascular disease (HCC)    Pneumonia 08/18/2013   pt. 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 Arlester Ladd and Dr. Sherene Dilling   Severe aortic stenosis    Venous thrombosis    Recurrent   Past Surgical History:  Procedure Laterality Date   APPENDECTOMY     BACK SURGERY     CHOLECYSTECTOMY N/A 01/17/2024   Procedure: LAPAROSCOPIC CHOLECYSTECTOMY WITH ICG;  Surgeon: Enid Harry, MD;  Location: [facility] OR;  Service: General;  Laterality: N/A;   COLONOSCOPY N/A 04/13/2015   Procedure: COLONOSCOPY;  Surgeon: Jolinda Necessary, MD;  Location: [facility] ENDOSCOPY;  Service: Endoscopy;  Laterality: N/A;   CORONARY STENT INTERVENTION N/A 06/04/2022   Procedure: CORONARY STENT INTERVENTION;  Surgeon: Lucendia Rusk, MD;  Location: [facility] INVASIVE CV LAB;  Service: Cardiovascular;  Laterality: N/A;   EMPYEMA DRAINAGE N/A 11/25/2014   Procedure: EMPYEMA DRAINAGE;  Surgeon: Norita Beauvais, MD;  Location: [facility] OR;  Service: Thoracic;  Laterality: N/A;   ESOPHAGOGASTRODUODENOSCOPY N/A 04/10/2015   Procedure: ESOPHAGOGASTRODUODENOSCOPY (EGD);  Surgeon: Baldo Bonds, MD;  Location: [facility] ENDOSCOPY;  Service: Endoscopy;  Laterality: N/A;   EYE SURGERY     both eyes, cataracts removed, denies lens implants   FLEXIBLE SIGMOIDOSCOPY N/A 04/12/2015   Procedure: FLEXIBLE SIGMOIDOSCOPY;  Surgeon: Jolinda Necessary, MD;  Location: [facility] ENDOSCOPY;  Service: Endoscopy;  Laterality: N/A;   HERNIA REPAIR     LEFT HEART CATH AND CORONARY ANGIOGRAPHY N/A 06/04/2022   Procedure: LEFT HEART CATH AND CORONARY ANGIOGRAPHY;  Surgeon: Lucendia Rusk, MD;   Location: [facility] INVASIVE CV LAB;  Service: Cardiovascular;  Laterality: N/A;   RIGHT HEART CATH AND CORONARY ANGIOGRAPHY N/A 09/28/2023   Procedure: RIGHT HEART CATH AND CORONARY ANGIOGRAPHY;  Surgeon: Arnoldo Lapping, MD;  Location: [facility] INVASIVE CV LAB;  Service: Cardiovascular;  Laterality: N/A;   SHOULDER SURGERY Right    TRANSCATHETER AORTIC VALVE REPLACEMENT,SUBCLAVIAN Left 11/08/2023   Procedure: Transcatheter Aortic Valve Replacement-Subclavian;  Surgeon: Arnoldo Lapping, MD;  Location: [facility] INVASIVE CV LAB;  Service: Cardiovascular;  Laterality: Left;   TRANSESOPHAGEAL ECHOCARDIOGRAM (CATH LAB) N/A 11/08/2023   Procedure: TRANSESOPHAGEAL ECHOCARDIOGRAM;  Surgeon: Arnoldo Lapping, MD;  Location: [facility] INVASIVE CV LAB;  Service: Cardiovascular;  Laterality: N/A;   VIDEO ASSISTED THORACOSCOPY Right 11/25/2014   Procedure: VIDEO ASSISTED THORACOSCOPY;  Surgeon: Norita Beauvais, MD;  Location: [facility] OR;  Service: Thoracic;  Laterality: Right;   VIDEO BRONCHOSCOPY N/A 11/25/2014   Procedure: VIDEO BRONCHOSCOPY;  Surgeon: Norita Beauvais, MD;  Location: [facility] OR;  Service: Thoracic;  Laterality: N/A;   Social History:  reports that he quit smoking about 31 years ago. His smoking use included cigarettes. He has never used smokeless tobacco. He reports that he does not drink alcohol and does not use drugs.  Allergies  Allergen Reactions   Codeine Hives and Other (See Comments)    Takes benadryl  to stop allergic reactions    Family History  Problem Relation Age of Onset   CAD Father 68   Liver cancer Brother    CAD Sister     Prior to Admission medications   Medication Sig Start Date End Date Taking? Authorizing Provider  acetaminophen  (TYLENOL ) 500 MG tablet Take 500 mg by mouth See admin instructions. Take 500mg  (1 tablet) by mouth three to four times a day.   Yes [provider]  albuterol  (VENTOLIN  HFA) 108 (90 Base) MCG/ACT inhaler Inhale 2 puffs into the lungs every 4 (four) hours as  needed for wheezing or shortness of breath.   Yes [provider]  allopurinol  (ZYLOPRIM ) 300 MG tablet Take 300 mg by mouth at bedtime.   Yes [provider]  atorvastatin  (LIPITOR) 20 MG tablet Take 1 tablet (20 mg total) by mouth daily. 10/05/23  Yes Croitoru, Mihai, MD  benzonatate  (TESSALON  PERLES) 100 MG capsule Take 1 capsule (100 mg total) by mouth 3 (three) times daily as needed for cough. 01/21/24 01/20/25 Yes Kraig Peru, MD  budesonide -formoterol  (SYMBICORT ) 160-4.5 MCG/ACT inhaler Inhale 2 puffs into the lungs in the morning and at bedtime. 02/28/23  Yes Gonfa, Taye T, MD  Choline Fenofibrate  (FENOFIBRIC ACID ) 135 MG CPDR TAKE 1 TABLET BY MOUTH DAILY 09/26/23  Yes Arnoldo Lapping, MD  cilostazol  (PLETAL ) 100 MG tablet Take 100 mg by mouth daily.   Yes [provider]  Cyanocobalamin  (VITAMIN DEFICIENCY SYSTEM-B12) 1000 MCG/ML KIT Inject 1,000 mcg into the skin every 30 (thirty) days.   Yes [provider]  empagliflozin  (JARDIANCE ) 10 MG TABS tablet Take 1 tablet (10 mg total) by mouth daily. 08/25/23  Yes Arnoldo Lapping, MD  furosemide  (LASIX )  20 MG tablet Take 1 tablet (20 mg total) by mouth daily as needed. 02/08/24 05/08/24 Yes Croitoru, Mihai, MD  gabapentin  (NEURONTIN ) 600 MG tablet Take 600 mg by mouth 3 (three) times daily. 12/02/23 12/01/24 Yes [provider]  guaiFENesin  (MUCINEX ) 600 MG 12 hr tablet Take 1 tablet (600 mg total) by mouth 2 (two) times daily. 01/21/24 01/20/25 Yes Kraig Peru, MD  losartan  (COZAAR ) 50 MG tablet Take 1 tablet (50 mg total) by mouth daily. 02/08/24 05/08/24 Yes Croitoru, Mihai, MD  metoprolol  succinate (TOPROL -XL) 100 MG 24 hr tablet Take 1 tablet (100 mg total) by mouth daily. 08/25/23  Yes Arnoldo Lapping, MD  ondansetron  (ZOFRAN ) 8 MG tablet Take 8 mg by mouth 2 (two) times daily as needed for vomiting or nausea. 05/25/22  Yes [provider]  oxyCODONE  (OXY IR/ROXICODONE ) 5 MG immediate release tablet  Take 1 tablet (5 mg total) by mouth every 4 (four) hours as needed for moderate pain (pain score 4-6). 01/20/24  Yes Enid Harry, MD  pyridOXINE  (B-6) 50 MG tablet Take 1 tablet (50 mg total) by mouth daily. 02/28/23  Yes Theadore Finger, MD  REPATHA  SURECLICK 140 MG/ML SOAJ INJECT 140 MGS SUBCUTANEOUSLY EVERY 14 DAYS 11/28/23  Yes Croitoru, Mihai, MD  tamsulosin  (FLOMAX ) 0.4 MG CAPS capsule Take 0.4 mg by mouth every evening.   Yes [provider]  warfarin (COUMADIN ) 2 MG tablet Take 2-4 mg by mouth See admin instructions. Take 2mg  (1 tablet) by mouth on weekdays and 4mg  (2 tablets) on weekends.   Yes [provider]  nitroGLYCERIN  (NITROSTAT ) 0.3 MG SL tablet Place under tongue. May take every 5 minutes up to 3 doses for chest pain. If still having pain after 3 doses, call 911. Patient not taking: Reported on 02/13/2024 08/25/23   Arnoldo Lapping, MD    Physical Exam: Vitals:   02/13/24 0524 02/13/24 0525  BP: (!) 107/49   Pulse: (!) 105   Resp: (!) 24   Temp: (!) 97.3 F (36.3 C)   TempSrc: Oral   SpO2: 97%   Weight:  91.2 kg  Height:  6\' 1"  (1.854 m)   General: Alert, oriented x3, resting comfortably in no acute distress HEENT: EOMI, oropharynx clear, moist mucous membranes, hearing intact Neck: Trachea midline and no gross thyromegaly Respiratory: Lungs clear to auscultation bilaterally with normal respiratory effort; no w/r/r Cardiovascular: Regular rate and rhythm w/o m/r/g Abdomen: Soft, nontender, nondistended. Positive bowel sounds MSK: No obvious joint deformities or swelling Skin: No obvious rashes or lesions Neurologic: Awake, alert, spontaneously moves all extremities, strength intact Psychiatric: Appropriate mood and affect, conversational and cooperative  Data Reviewed:  Lab Results  Component Value Date   WBC 13.1 (H) 02/13/2024   HGB 15.6 02/13/2024   HCT 46.0 02/13/2024   MCV 97.3 02/13/2024   PLT 297 02/13/2024   Lab Results  Component  Value Date   GLUCOSE 76 02/13/2024   CALCIUM  9.1 02/13/2024   NA 138 02/13/2024   K 4.3 02/13/2024   CO2 20 (L) 02/13/2024   CL 105 02/13/2024   BUN 22 02/13/2024   CREATININE 1.84 (H) 02/13/2024   Lab Results  Component Value Date   ALT 14 02/13/2024   AST 33 02/13/2024   ALKPHOS 42 02/13/2024   BILITOT 1.2 02/13/2024   Lab Results  Component Value Date   INR 2.2 (H) 02/13/2024   INR 1.6 (H) 01/21/2024   INR 1.6 (H) 01/20/2024   PROTIME 20.4 (H) 08/26/2015  PROTIME 45.6 (H) 08/14/2015   PROTIME 42.0 (H) 08/04/2015   Radiology: DG Chest Portable 1 View Result Date: 02/13/2024 CLINICAL DATA:  Respiratory distress EXAM: PORTABLE CHEST 1 VIEW COMPARISON:  01/19/2024 FINDINGS: Airspace disease at the left base with adjacent pleural fluid possible. Background of interstitial coarsening from emphysema. Normal heart size and mediastinal contours. Artifact from EKG leads. IMPRESSION: Pneumonia at the left lung base. Electronically Signed   By: Ronnette Coke M.D.   On: 02/13/2024 05:47   Assessment and Plan: 78 y.o. 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   #LLL CAP, new, poa Pt presented with SOB and was BIBA; CXR in ED demonstrated LLL CAP -S/p IV CTX/axithromycin x1 in ED -Continue IV CTX 1g daily for 4 doses to complete 5 day CAP course -Continue PO azithromycin  500mg  daily for 2 days to compelte 3  day CAP course -Contine duonebs q6h sch and transition to prn once pt able  #Elevated BNP, new, poa #BLE edema, mild, poa Presumed volume overload from dietary indiscretion. Pt reports not eating salt, but admits to eating canned food. Pt reports cooking all meals and very little fast food. -Continue increased dose lasix  20-->40mg  daily for now; may need more aggressive IV diuresis if not returning to nl respiratory status depsite abx above  #H/o COPD -PTA inhalers (Breo Ellipta  in place of pta Symbicort ) + duonebs above  #H/o DVT/PE -PTA warfarin   Advance Care Planning:   Code Status: Full Code   Consults: N/A  Family Communication: N/A  Severity of Illness: The appropriate patient status for this patient is INPATIENT. Inpatient status is judged to be reasonable and necessary in order to provide the required intensity of service to ensure the patient's safety. The patient's presenting symptoms, physical exam findings, and initial radiographic and laboratory data in the context of their chronic comorbidities is felt to place them at high risk for further clinical deterioration. Furthermore, it is not anticipated that the patient will be medically stable for discharge from the hospital within 2 midnights of admission.   * I certify that at the point of admission it is my clinical judgment that the patient will require inpatient hospital care spanning beyond 2 midnights from the point of admission due to high intensity of service, high risk for further deterioration and high frequency of surveillance required.*  ------- I spent 75 minutes reviewing previous labs/notes, obtaining separate history at the bedside, counseling/discussing the treatment plan outlined above, ordering medications/tests, and performing clinical documentation.  Author: Arne Langdon, MD 02/13/2024 7:54 AM  For on call review [email or web address].

## 2024-02-14 ENCOUNTER — Encounter (HOSPITAL_COMMUNITY): Payer: Self-pay | Admitting: Hospitalist

## 2024-02-14 DIAGNOSIS — J9601 Acute respiratory failure with hypoxia: Secondary | ICD-10-CM | POA: Diagnosis not present

## 2024-02-14 LAB — PROTIME-INR
INR: 3.4 — ABNORMAL HIGH (ref 0.8–1.2)
Prothrombin Time: 34.8 s — ABNORMAL HIGH (ref 11.4–15.2)

## 2024-02-14 MED ORDER — SODIUM CHLORIDE 0.9 % IV BOLUS
1500.0000 mL | Freq: Once | INTRAVENOUS | Status: AC
  Administered 2024-02-14: 1500 mL via INTRAVENOUS

## 2024-02-14 MED ORDER — DOCUSATE SODIUM 100 MG PO CAPS
100.0000 mg | ORAL_CAPSULE | Freq: Every day | ORAL | Status: DC
  Administered 2024-02-14 – 2024-02-15 (×2): 100 mg via ORAL
  Filled 2024-02-14 (×4): qty 1

## 2024-02-14 MED ORDER — SODIUM CHLORIDE 0.9 % IV SOLN: INTRAVENOUS | Status: DC

## 2024-02-14 MED ORDER — PANTOPRAZOLE SODIUM 40 MG PO TBEC
40.0000 mg | DELAYED_RELEASE_TABLET | Freq: Every day | ORAL | Status: DC
  Administered 2024-02-14 – 2024-02-17 (×4): 40 mg via ORAL
  Filled 2024-02-14 (×4): qty 1

## 2024-02-14 NOTE — Plan of Care (Signed)

## 2024-02-14 NOTE — Progress Notes (Signed)
 Came to room for flutter valve, pt declines flutter valve at this time.  Pt states he is going home soon and states he already has one at home- doesn't want the flutter valve in the hospital.

## 2024-02-14 NOTE — Hospital Course (Signed)
 Tony Zhang is a 78 y.o. 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  In the ED, patient was tachycardic tachypneic but afebrile.  Initial labs showed leukocytosis.  BNP elevated at 277.  Chemistry was notable for creatinine of 1.8.  COVID influenza and RSV was negative.  X-ray of the chest was positive for infiltrate in the left lower lobe.  Blood cultures were drawn and patient was admitted hospital for further evaluation and treatment.  Left lower lobe community-acquired pneumonia.  Continue Rocephin  and Zithromax .  Blood cultures negative in 1 day.  Lactate was 1.8.  Continue DuoNebs.  Will discontinue IV fluids.  Patient received 3 and half liters of IV fluid bolus in the ED.  Currently on 6 L oxygen by nasal cannula.   Elevated BNP,BLE edema, mild, present on admission Severe aortic stenosis status post TAVR. On oral Lasix  at home.  Dietary changes discussed with the patient.  Dose of Lasix  was increased to 40 mg from 20 from home.  Patient is on Jardiance , Lipitor, Lasix , losartan  and metoprolol  at home.  Hold Jardiance  for now.  Continue antihypertensives with holding parameters.  Discontinue IV fluids for now.  Will continue to monitor.  Blood pressure marginally low so we will continue with midodrine.  Chronic kidney disease stage IIIb.  Continue to monitor while on diuretics.  Diabetes mellitus type 2.  On Jardiance  at home.  Continue diabetic diet.  Essential hypertension.  Continue losartan  and metoprolol  from home.   COPD Continue bronchodilators.   History of DVT/PE Continue warfarin.  Pharmacy to dose.  INR today at 3.4.

## 2024-02-14 NOTE — Progress Notes (Signed)
 Mobility Specialist: Progress Note   02/14/24 1405  Mobility  Activity Ambulated with assistance in room  Level of Assistance Standby assist, set-up cues, supervision of patient - no hands on  Assistive Device Front wheel walker  Distance Ambulated (ft) 3 ft  Activity Response Tolerated well  Mobility Referral Yes  Mobility visit 1 Mobility  Mobility Specialist Start Time (ACUTE ONLY) 1345  Mobility Specialist Stop Time (ACUTE ONLY) 1402  Mobility Specialist Time Calculation (min) (ACUTE ONLY) 17 min    Pt was agreeable to mobility session - received in bed. Initially only agreeable to sit up on EOB and refusing all OOB mobility. With a lot of encouragement and causal conversation, pt agreed to get up and take some steps. SV with increased effort for bed mobility. MinA to boost to stand. SV for ambulation. MS offered to connect [place] to O2 tank and walk further but pt declined. At EOS pt satisfied and suggested trying to walk in the hallway tomorrow. Left on EOB with all needs met, call bell in reach.   Deloria Fetch Mobility Specialist Please contact via SecureChat or Rehab office at [phone]

## 2024-02-14 NOTE — Progress Notes (Signed)
 PHARMACY - ANTICOAGULATION CONSULT NOTE  Pharmacy Consult for warfarin Indication: DVT  Allergies  Allergen Reactions   Codeine Hives and Other (See Comments)    Takes benadryl  to stop allergic reactions    Patient Measurements: Height: 6\' 1"  (185.4 cm) Weight: 91.2 kg (201 lb) IBW/kg (Calculated) : 79.9 HEPARIN  DW (KG): 91.2  Vital Signs: Temp: 98.4 F (36.9 C) (04/29 0734) Temp Source: Oral (04/29 0734) BP: 90/47 (04/29 0734) Pulse Rate: 86 (04/29 0734)  Labs: Recent Labs    02/13/24 0514 02/13/24 0519 02/13/24 0848 02/14/24 0548  HGB 14.9 15.6  --   --   HCT 47.4 46.0  --   --   PLT 297  --   --   --   LABPROT 24.9*  --   --  34.8*  INR 2.2*  --   --  3.4*  CREATININE 1.84*  --   --   --   TROPONINIHS 42*  --  41*  --     Estimated Creatinine Clearance: 37.4 mL/min (A) (by C-G formula based on SCr of 1.84 mg/dL (H)).   Medical History: Past Medical History:  Diagnosis Date   AAA (abdominal aortic aneurysm) (HCC)    Blood dyscrasia    followed by Dr. Scherrie Curt, for clotting issue, has been on Coumadin  for about 20 yrs.     CAD (coronary artery disease)    Chronic kidney disease    COPD (chronic obstructive pulmonary disease) (HCC)    Diabetes mellitus without complication (HCC)    DJD (degenerative joint disease)    Dyslipidemia    Dyspnea    Gout    Malignant hypertension    Peripheral vascular disease (HCC)    Pneumonia 08/18/2013   pt. 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 Arlester Ladd and Dr. Sherene Dilling   Severe aortic stenosis    Venous thrombosis    Recurrent    Assessment: 78 y.o. 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   Patient on warfarin PTA - 2 mg on weekdays and 4 mg on weekends.  INR 3.4  Pharmacy consulted for warfarin management.  Will monitor closely due to interaction with Azithro,  Goal of Therapy:  INR 2-3 Monitor platelets by anticoagulation protocol: Yes   Plan:  D/c Enoxaparin  No Warfarin today  Monitor INR daily given significant drug interaction.  Thank you. Lennice Quivers, PharmD 02/14/2024, 8:46 AM

## 2024-02-14 NOTE — Evaluation (Signed)
 Physical Therapy Evaluation Patient Details Name: Tony Zhang MRN: [id number] DOB: Aug 21, 1946 Today's Date: 02/14/2024  History of Present Illness  78 y.o. male presents to [facility] hospital on 02/13/2024 with DOE. Chest x-ray concerning for LLL CAP. PMH includes COPD, CAD, heart failure, PAD, TAVR, DVT and PE, HTN, HLD, emphysema.  Clinical Impression  Pt presents to PT with deficits in functional mobility, balance, strength, power, endurance. Pt is very weak at this time, unable to perform bed mobility or stand without physical assistance. Pt refuses attempts at transferring out of bed due to back pain and fatigue. PT recommends short term inpatient PT services at the time of discharge due to lack of caregiver support and poor mobility quality. Pt will need max home health services if declining the possibility of inpatient placement.        If plan is discharge home, recommend the following: A lot of help with walking and/or transfers;A lot of help with bathing/dressing/bathroom;Assistance with cooking/housework;Assist for transportation;Help with stairs or ramp for entrance   Can travel by private vehicle   No    Equipment Recommendations Wheelchair (measurements PT);Wheelchair cushion (measurements PT)  Recommendations for Other Services       Functional Status Assessment Patient has had a recent decline in their functional status and demonstrates the ability to make significant improvements in function in a reasonable and predictable amount of time.     Precautions / Restrictions Precautions Precautions: Fall Recall of Precautions/Restrictions: Intact Precaution/Restrictions Comments: 6L HFNC Restrictions Weight Bearing Restrictions Per Provider Order: No      Mobility  Bed Mobility Overal bed mobility: Needs Assistance Bed Mobility: Rolling, Sidelying to Sit, Sit to Sidelying Rolling: Supervision Sidelying to sit: Min assist     Sit to sidelying: Contact guard assist       Transfers Overall transfer level: Needs assistance Equipment used: 1 person hand held assist Transfers: Sit to/from Stand Sit to Stand: Mod assist           General transfer comment: requires assistance to power up due to LE weakness. Pt refuses attempts at out of bed transfer to chair    Ambulation/Gait                  Stairs            Wheelchair Mobility     Tilt Bed    Modified Rankin (Stroke Patients Only)       Balance Overall balance assessment: Needs assistance Sitting-balance support: No upper extremity supported, Feet supported Sitting balance-Leahy Scale: Good     Standing balance support: Bilateral upper extremity supported, Reliant on assistive device for balance Standing balance-Leahy Scale: Poor Standing balance comment: modA                             Pertinent Vitals/Pain Pain Assessment Pain Assessment: Faces Faces Pain Scale: Hurts even more Pain Location: back Pain Descriptors / Indicators: Aching Pain Intervention(s): Monitored during session    Home Living Family/patient expects to be discharged to:: Private residence Living Arrangements: Alone Available Help at Discharge: Friend(s);Available PRN/intermittently (no consistent assistance per patient) Type of Home: Mobile home Home Access: Stairs to enter Entrance Stairs-Rails: Right;Left Entrance Stairs-Number of Steps: 4   Home Layout: One level Home Equipment: Pharmacist, hospital (2 wheels);Rollator (4 wheels);Cane - single point;BSC/3in1      Prior Function Prior Level of Function : Independent/Modified Independent  Mobility Comments: ambulating with SPC since prior hospitalization       Extremity/Trunk Assessment   Upper Extremity Assessment Upper Extremity Assessment: Generalized weakness    Lower Extremity Assessment Lower Extremity Assessment: Generalized weakness    Cervical / Trunk Assessment Cervical / Trunk  Assessment: Kyphotic  Communication   Communication Communication: No apparent difficulties    Cognition Arousal: Alert Behavior During Therapy: WFL for tasks assessed/performed   PT - Cognitive impairments: Awareness                         Following commands: Intact       Cueing Cueing Techniques: Verbal cues     General Comments General comments (skin integrity, edema, etc.): pt on 6L HFNC, SpO2 91-92% during session.    Exercises     Assessment/Plan    PT Assessment Patient needs continued PT services  PT Problem List Decreased strength;Decreased activity tolerance;Decreased balance;Decreased mobility;Decreased knowledge of use of DME;Cardiopulmonary status limiting activity;Pain       PT Treatment Interventions DME instruction;Gait training;Stair training;Functional mobility training;Therapeutic activities;Therapeutic exercise;Cognitive remediation;Neuromuscular re-education;Balance training;Patient/family education;Wheelchair mobility training    PT Goals (Current goals can be found in the Care Plan section)  Acute Rehab PT Goals Patient Stated Goal: to return home, feel better PT Goal Formulation: With patient Time For Goal Achievement: 02/28/24 Potential to Achieve Goals: Fair    Frequency Min 3X/week     Co-evaluation               AM-PAC PT "6 Clicks" Mobility  Outcome Measure Help needed turning from your back to your side while in a flat bed without using bedrails?: A Little Help needed moving from lying on your back to sitting on the side of a flat bed without using bedrails?: A Little Help needed moving to and from a bed to a chair (including a wheelchair)?: A Lot Help needed standing up from a chair using your arms (e.g., wheelchair or bedside chair)?: A Lot Help needed to walk in hospital room?: Total Help needed climbing 3-5 steps with a railing? : Total 6 Click Score: 12    End of Session Equipment Utilized During Treatment:  Oxygen Activity Tolerance: Patient limited by fatigue Patient left: in bed;with bed alarm set;with call bell/phone within reach Nurse Communication: Mobility status;Need for lift equipment PT Visit Diagnosis: Other abnormalities of gait and mobility (R26.89);Muscle weakness (generalized) (M62.81)    Time: 6962-9528 PT Time Calculation (min) (ACUTE ONLY): 14 min   Charges:   PT Evaluation $PT Eval Low Complexity: 1 Low   PT General Charges $$ ACUTE PT VISIT: 1 Visit         Rexie Catena, PT, DPT Acute Rehabilitation Office [phone]   Rexie Catena 02/14/2024, 10:24 AM

## 2024-02-14 NOTE — TOC CM/SW Note (Signed)
 Transition of Care [facility]) - CAGE-AID Screening   Patient Details  Name: Tony Zhang MRN: [id number] Date of Birth: 1946/02/27  [facility] CM/SW Contact:    Symir Mah A [place], LCSW Phone Number: 02/14/2024, 4:33 PM   Clinical Narrative:  CSW met with pt at bedside for SNF recommendation. Pt said that he would prefer to go home than go to rehab at discharge. He said that he lives alone and has family support but is not open to having family stay with pt at home to assist. Discussed pt is a lot of assist with ADL's and ambulation and remained adamant about wanting home.  Does state that he has some DME equipment, walker, cane, rollator and bedside commode. Also was not open to home health services. RNCM and provider notified.    TOC will continue to follow.   CAGE-AID Screening:

## 2024-02-14 NOTE — Progress Notes (Addendum)
 PROGRESS NOTE  Tony Zhang:[id number] DOB: Nov 26, 1945 DOA: 02/13/2024 PCP: Spero Dye, PA-C   LOS: 1 day   Brief narrative:   Tony Zhang is a 78 y.o. 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  In the ED, patient was tachycardic tachypneic but afebrile.  Initial labs showed leukocytosis.  BNP elevated at 277.  Chemistry was notable for creatinine of 1.8.  COVID influenza and RSV was negative.  X-ray of the chest was positive for infiltrate in the left lower lobe.  Blood cultures were drawn and patient was admitted hospital for further evaluation and treatment.     Assessment/Plan: Principal Problem:   Acute hypoxemic respiratory failure (HCC)  Left lower lobe community-acquired pneumonia.  Continue Rocephin  and Zithromax .  Blood cultures negative in 1 day.  Lactate was 1.8.  Continue DuoNebs.  Will discontinue IV fluids.  Patient received 3 and half liters of IV fluid bolus in the ED.  Currently on 6 L oxygen by nasal cannula which has been down from 15 L nonrebreather mask on presentation..  Will continue to wean oxygen as able.  Will add incentive spirometry flutter valve.  Temperature max of 98.9 F.   Elevated BNP,BLE edema, mild, present on admission Severe aortic stenosis status post TAVR. On oral Lasix  at home.  Dietary changes discussed with the patient.  Dose of Lasix  was increased to 40 mg from 20 from home.  Patient is on Jardiance , Lipitor, Lasix , losartan  and metoprolol  at home.  Hold Jardiance  for now.  Continue antihypertensives with holding parameters.  Discontinue IV fluids for now.  Will continue to monitor.  Blood pressure marginally low so we will continue with midodrine.  Review of her recent 2D  echocardiogram from 12/07/2023 showed EF of 60 to 65% with moderate LVH.  Mild abdominal discomfort.  History of cholecystectomy laparoscopic.  Will continue to monitor.  Add Protonix .  Add stool softener.  Chronic kidney disease stage IIIb.  Continue to monitor while on diuretics.  Check BMP in AM.  Diabetes mellitus type 2.  On Jardiance  at home.  Continue diabetic diet.  Latest hemoglobin A1c of 5.4.  On regular diet.  Essential hypertension.   Hypotension at rest.  Continue losartan  and metoprolol  from home with holding parameters for low blood pressure.  Check orthostatic vitals.  Continue midodrine.   COPD Continue bronchodilators.  Continue supplemental oxygen on 6 L.  Add incentive spirometer and flutter valve.   History of DVT/PE Continue warfarin.  Pharmacy to dose.  INR today at 3.4.  Debility weakness.  Will get PT OT evaluation.  DVT prophylaxis: On Coumadin    Disposition: Likely home with home health, uncertain at this time.  Will need PT OT evaluation when more stable.  Status is: Inpatient Remains inpatient appropriate because: Pending clinical improvement, on supplemental oxygen, IV antibiotics,    Code Status:     Code Status: Full Code  Family Communication: None at bedside.  I think to reach out to the patient's friend who is listed as the contact in the computer but was unable to reach out despite multiple attempts.  Consultants: None  Procedures: None  Anti-infectives:  None  Anti-infectives (From admission, onward)    Start     Dose/Rate Route Frequency Ordered Stop   02/14/24 1000  azithromycin  (ZITHROMAX )  tablet 500 mg        500 mg Oral Daily 02/13/24 0933 02/16/24 0959   02/14/24 0000  cefTRIAXone  (ROCEPHIN ) 1 g in sodium chloride  0.9 % 100 mL IVPB        1 g 200 mL/hr over 30 Minutes Intravenous Every 24 hours 02/13/24 0933 02/17/24 2359   02/13/24 0600  cefTRIAXone  (ROCEPHIN ) 1 g in sodium chloride  0.9 % 100 mL IVPB        1 g 200 mL/hr  over 30 Minutes Intravenous Once 02/13/24 0556 02/13/24 0643   02/13/24 0600  azithromycin  (ZITHROMAX ) 500 mg in sodium chloride  0.9 % 250 mL IVPB        500 mg 250 mL/hr over 60 Minutes Intravenous  Once 02/13/24 0556 02/13/24 0748   02/13/24 0600  vancomycin  (VANCOCIN ) IVPB 1000 mg/200 mL premix  Status:  Discontinued        1,000 mg 200 mL/hr over 60 Minutes Intravenous  Once 02/13/24 0556 02/13/24 0557   02/13/24 0600  vancomycin  (VANCOREADY) IVPB 2000 mg/400 mL        2,000 mg 200 mL/hr over 120 Minutes Intravenous  Once 02/13/24 0557 02/13/24 1039        Subjective: Today, patient was seen and examined at bedside.  Complains of chest discomfort due to coughing with some productive sputum.  Denies any fever chills runny nose.  Denies any nausea vomiting.  Complains of sore abdomen and recent laparoscopic cholecystectomy.  Objective: Vitals:   02/14/24 0734 02/14/24 0803  BP: (!) 90/47   Pulse: 86   Resp: 18   Temp: 98.4 F (36.9 C)   SpO2: 96% 96%    Intake/Output Summary (Last 24 hours) at 02/14/2024 0915 Last data filed at 02/14/2024 5621 Gross per 24 hour  Intake 2282.17 ml  Output 200 ml  Net 2082.17 ml   Filed Weights   02/13/24 0525  Weight: 91.2 kg   Body mass index is 26.52 kg/m.   Physical Exam:  GENERAL: Patient is alert awake and oriented. Not in obvious distress.  On 6 L of oxygen by nasal cannula, appears to be weak and deconditioned, Communicative, HENT: No scleral pallor or icterus. Pupils equally reactive to light. Oral mucosa is moist NECK: is supple, no gross swelling noted. CHEST: Diminished breath sounds bilaterally.  Coarse breath sounds noted.   CVS: S1 and S2 heard, ABDOMEN: Soft, non-tender, bowel sounds are present.  Laparoscopic scars healed. EXTREMITIES: No edema. CNS: Cranial nerves are intact. No focal motor deficits. SKIN: warm and dry without rashes.  Data Review: I have personally reviewed the following laboratory data and  studies,  CBC: Recent Labs  Lab 02/13/24 0514 02/13/24 0519  WBC 13.1*  --   NEUTROABS 11.9*  --   HGB 14.9 15.6  HCT 47.4 46.0  MCV 97.3  --   PLT 297  --    Basic Metabolic Panel: Recent Labs  Lab 02/13/24 0514 02/13/24 0519  NA 136 138  K 4.3 4.3  CL 105  --   CO2 20*  --   GLUCOSE 76  --   BUN 22  --   CREATININE 1.84*  --   CALCIUM  9.1  --    Liver Function Tests: Recent Labs  Lab 02/13/24 0514  AST 33  ALT 14  ALKPHOS 42  BILITOT 1.2  PROT 6.4*  ALBUMIN 3.2*   No results for input(s): "LIPASE", "AMYLASE" in the last 168 hours. No results for input(s): "AMMONIA" in the last  168 hours. Cardiac Enzymes: No results for input(s): "CKTOTAL", "CKMB", "CKMBINDEX", "TROPONINI" in the last 168 hours. BNP (last 3 results) Recent Labs    11/12/23 0320 01/20/24 0405 02/13/24 0514  BNP 255.0* 426.2* 277.5*    ProBNP (last 3 results) No results for input(s): "PROBNP" in the last 8760 hours.  CBG: No results for input(s): "GLUCAP" in the last 168 hours. Recent Results (from the past 240 hours)  Resp panel by RT-PCR (RSV, Flu A&B, Covid) Anterior Nasal Swab     Status: None   Collection Time: 02/13/24  5:14 AM   Specimen: Anterior Nasal Swab  Result Value Ref Range Status   SARS Coronavirus 2 by RT PCR NEGATIVE NEGATIVE Final   Influenza A by PCR NEGATIVE NEGATIVE Final   Influenza B by PCR NEGATIVE NEGATIVE Final    Comment: (NOTE) The Xpert Xpress SARS-CoV-2/FLU/RSV plus assay is intended as an aid in the diagnosis of influenza from Nasopharyngeal swab specimens and should not be used as a sole basis for treatment. Nasal washings and aspirates are unacceptable for Xpert Xpress SARS-CoV-2/FLU/RSV testing.  Fact Sheet for Patients: BloggerCourse.com  Fact Sheet for Healthcare Providers: SeriousBroker.it  This test is not yet approved or cleared by the [place]  FDA and has been authorized for  detection and/or diagnosis of SARS-CoV-2 by FDA under an Emergency Use Authorization (EUA). This EUA will remain in effect (meaning this test can be used) for the duration of the COVID-19 declaration under Section 564(b)(1) of the Act, 21 U.S.C. section 360bbb-3(b)(1), unless the authorization is terminated or revoked.     Resp Syncytial Virus by PCR NEGATIVE NEGATIVE Final    Comment: (NOTE) Fact Sheet for Patients: BloggerCourse.com  Fact Sheet for Healthcare Providers: SeriousBroker.it  This test is not yet approved or cleared by the [place]  FDA and has been authorized for detection and/or diagnosis of SARS-CoV-2 by FDA under an Emergency Use Authorization (EUA). This EUA will remain in effect (meaning this test can be used) for the duration of the COVID-19 declaration under Section 564(b)(1) of the Act, 21 U.S.C. section 360bbb-3(b)(1), unless the authorization is terminated or revoked.  Performed at [facility], [address], [place], [place] [address]   Blood Culture (routine x 2)     Status: None (Preliminary result)   Collection Time: 02/13/24  6:13 AM   Specimen: BLOOD  Result Value Ref Range Status   Specimen Description BLOOD LEFT ANTECUBITAL  Final   Special Requests   Final    BOTTLES DRAWN AEROBIC AND ANAEROBIC Blood Culture results may not be optimal due to an inadequate volume of blood received in culture bottles   Culture   Final    NO GROWTH 1 DAY Performed at [facility], [address], [place], [place] [address]    Report Status PENDING  Incomplete  Blood Culture (routine x 2)     Status: None (Preliminary result)   Collection Time: 02/13/24  6:20 AM   Specimen: BLOOD LEFT HAND  Result Value Ref Range Status   Specimen Description BLOOD LEFT HAND  Final   Special Requests   Final    BOTTLES DRAWN AEROBIC AND ANAEROBIC Blood Culture adequate volume   Culture   Final    NO GROWTH 1  DAY Performed at [facility], [address] [place], [place] [address]    Report Status PENDING  Incomplete     Studies: DG Chest Portable 1 View Result Date: 02/13/2024 CLINICAL  DATA:  Respiratory distress EXAM: PORTABLE CHEST 1 VIEW COMPARISON:  01/19/2024 FINDINGS: Airspace disease at the left base with adjacent pleural fluid possible. Background of interstitial coarsening from emphysema. Normal heart size and mediastinal contours. Artifact from EKG leads. IMPRESSION: Pneumonia at the left lung base. Electronically Signed   By: Ronnette Coke M.D.   On: 02/13/2024 05:47      Rosena Conradi, MD  [facility] 02/14/2024  If 7PM-7AM, please contact night-coverage

## 2024-02-15 DIAGNOSIS — J9601 Acute respiratory failure with hypoxia: Secondary | ICD-10-CM | POA: Diagnosis not present

## 2024-02-15 LAB — CBC
HCT: 35.5 % — ABNORMAL LOW (ref 39.0–52.0)
Hemoglobin: 11.3 g/dL — ABNORMAL LOW (ref 13.0–17.0)
MCH: 30.9 pg (ref 26.0–34.0)
MCHC: 31.8 g/dL (ref 30.0–36.0)
MCV: 97 fL (ref 80.0–100.0)
Platelets: 217 10*3/uL (ref 150–400)
RBC: 3.66 MIL/uL — ABNORMAL LOW (ref 4.22–5.81)
RDW: 17.2 % — ABNORMAL HIGH (ref 11.5–15.5)
WBC: 8.8 10*3/uL (ref 4.0–10.5)
nRBC: 0 % (ref 0.0–0.2)

## 2024-02-15 LAB — BASIC METABOLIC PANEL WITH GFR
Anion gap: 9 (ref 5–15)
BUN: 35 mg/dL — ABNORMAL HIGH (ref 8–23)
CO2: 19 mmol/L — ABNORMAL LOW (ref 22–32)
Calcium: 7.8 mg/dL — ABNORMAL LOW (ref 8.9–10.3)
Chloride: 108 mmol/L (ref 98–111)
Creatinine, Ser: 2.06 mg/dL — ABNORMAL HIGH (ref 0.61–1.24)
GFR, Estimated: 32 mL/min — ABNORMAL LOW (ref >60)
Glucose, Bld: 83 mg/dL (ref 70–99)
Potassium: 4.2 mmol/L (ref 3.5–5.1)
Sodium: 136 mmol/L (ref 135–145)

## 2024-02-15 LAB — PROTIME-INR
INR: 2.9 — ABNORMAL HIGH (ref 0.8–1.2)
Prothrombin Time: 30.5 s — ABNORMAL HIGH (ref 11.4–15.2)

## 2024-02-15 LAB — PHOSPHORUS: Phosphorus: 3.8 mg/dL (ref 2.5–4.6)

## 2024-02-15 LAB — MAGNESIUM: Magnesium: 1.5 mg/dL — ABNORMAL LOW (ref 1.7–2.4)

## 2024-02-15 MED ORDER — WARFARIN SODIUM 1 MG PO TABS
1.0000 mg | ORAL_TABLET | Freq: Once | ORAL | Status: AC
  Administered 2024-02-15: 1 mg via ORAL
  Filled 2024-02-15: qty 1

## 2024-02-15 MED ORDER — GABAPENTIN 400 MG PO CAPS
400.0000 mg | ORAL_CAPSULE | Freq: Three times a day (TID) | ORAL | Status: DC
  Administered 2024-02-15 – 2024-02-17 (×6): 400 mg via ORAL
  Filled 2024-02-15 (×6): qty 1

## 2024-02-15 NOTE — Progress Notes (Signed)
 Initially messaged on call MD regarding expiring tele orders but Assigned RN messaged about the wrong patient in error. On call MD then messaged regarding the correct patient and on call MD working on order renewal. Will continue to monitor this shift.

## 2024-02-15 NOTE — Plan of Care (Signed)

## 2024-02-15 NOTE — Progress Notes (Signed)
 Assigned RN notified MD on call regarding expired Telemetry orders, Assigned RN instructed to notify Resident. Unable to identify at this time. Will continue to monitor this shift.

## 2024-02-15 NOTE — [place] FL2 (Addendum)
 [place]  MEDICAID FL2 LEVEL OF CARE FORM     IDENTIFICATION  Patient Name: Tony Zhang Birthdate: 06-17-1946 Sex: male Admission Date (Current Location): 02/13/2024  [facility] and [place] Number:  Producer, television/film/video and Address:  [facility], [address] [place], [place] [address]      Provider Number: [id number]  Attending Physician Name and Address:  Rosena Conradi, MD  Relative Name and Phone Number:  Alger Infield Belvia Boyer)  [phone]    Current Level of Care: Hospital Recommended Level of Care: Skilled Nursing Facility Prior Approval Number:    Date Approved/Denied:   PASRR Number: [id number]  Discharge Plan: SNF    Current Diagnoses: Patient Active Problem List   Diagnosis Date Noted   Acute hypoxemic respiratory failure (HCC) 02/13/2024   Leukocytosis 01/19/2024   History of COVID-19 01/19/2024   History of DVT (deep vein thrombosis) 01/19/2024   Chronic anticoagulation 01/18/2024   S/P laparoscopic cholecystectomy 01/17/2024   Primary hypercoagulable state (HCC) 01/17/2024   History of transcatheter aortic valve replacement (TAVR) 01/17/2024   Chronic heart failure with preserved ejection fraction (HCC) 01/17/2024   Hematoma 11/19/2023   Acute biliary pancreatitis 11/12/2023   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   CAD (coronary artery disease) 09/29/2023   HTN (hypertension) 09/29/2023   Aortic stenosis, severe 09/28/2023   Non-ST elevation (NSTEMI) myocardial infarction [facility])    Chronic diastolic CHF (congestive heart failure), NYHA class 2 (HCC) 06/02/2022   Enlarged prostate 06/02/2022   Mediastinal lymphadenopathy 06/02/2022   GIB (gastrointestinal bleeding) 04/08/2015   Acute kidney injury superimposed on chronic kidney disease (HCC) 04/08/2015   Loculated pleural effusion    Empyema, right (HCC) 11/25/2014   PAD (peripheral artery disease) (HCC) 10/10/2014   Anticoagulated on Coumadin   10/10/2014   Microcytic anemia 09/18/2013   CAP (community acquired pneumonia) 09/09/2013   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  08/19/2013   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   DVT, lower extremity and pulmonary embolism, recurrent 08/19/2013   Hyperlipidemia 08/19/2013   Gout 08/19/2013   COPD with acute exacerbation (HCC) 08/19/2013   OSA (obstructive sleep apnea) 08/19/2013   Bilateral renal artery stenosis (HCC) 08/19/2013    Orientation RESPIRATION BLADDER Height & Weight     Self, Time, Situation, Place  O2 (HFNL, 6L) Continent Weight: 201 lb (91.2 kg) Height:  6\' 1"  (185.4 cm)  BEHAVIORAL SYMPTOMS/MOOD NEUROLOGICAL BOWEL NUTRITION STATUS      Continent Diet (see DC summary)  AMBULATORY STATUS COMMUNICATION OF NEEDS Skin   Extensive Assist Verbally Other (Comment), PU Stage and Appropriate Care (08/06/19 Sacrum Mid Stage I - Intact skin with non-blanchable redness of a localized area usually over a bony prominence. redness on sacrum. 4 Ports Abdomen) PU Stage 1 Dressing: Daily                     Personal Care Assistance Level of Assistance  Bathing, Feeding, Dressing Bathing Assistance: Maximum assistance Feeding assistance: Limited assistance Dressing Assistance: Maximum assistance     Functional Limitations Info  Hearing, Sight, Speech Sight Info: Adequate Hearing Info: Adequate Speech Info: Adequate    SPECIAL CARE FACTORS FREQUENCY  PT (By licensed PT), OT (By licensed OT)     PT Frequency: 5x/week OT Frequency: 5x/week            Contractures Contractures Info: Not present    Additional Factors Info  Code Status, Allergies Code Status Info: FULL Allergies  Info: Codeine           Current Medications (02/15/2024):  This is the current hospital active medication list Current Facility-Administered Medications  Medication Dose Route Frequency Provider Last Rate Last Admin   acetaminophen  (TYLENOL ) tablet  500 mg  500 mg Oral Q6H PRN Moore, Willie, MD   500 mg at 02/15/24 1610   allopurinol  (ZYLOPRIM ) tablet 300 mg  300 mg Oral QHS Arne Langdon, MD   300 mg at 02/14/24 2135   atorvastatin  (LIPITOR) tablet 20 mg  20 mg Oral Daily Arne Langdon, MD   20 mg at 02/15/24 0951   cefTRIAXone  (ROCEPHIN ) 1 g in sodium chloride  0.9 % 100 mL IVPB  1 g Intravenous Q24H Moore, Willie, MD 200 mL/hr at 02/15/24 0010 1 g at 02/15/24 0010   cilostazol  (PLETAL ) tablet 100 mg  100 mg Oral Daily Arne Langdon, MD   100 mg at 02/15/24 9604   docusate sodium  (COLACE) capsule 100 mg  100 mg Oral Daily Pokhrel, Laxman, MD   100 mg at 02/15/24 5409   fluticasone  furoate-vilanterol (BREO ELLIPTA ) 200-25 MCG/ACT 1 puff  1 puff Inhalation Daily Arne Langdon, MD   1 puff at 02/15/24 0741   furosemide  (LASIX ) tablet 40 mg  40 mg Oral Daily Pokhrel, Laxman, MD   40 mg at 02/14/24 0844   gabapentin  (NEURONTIN ) capsule 600 mg  600 mg Oral TID Arne Langdon, MD   600 mg at 02/15/24 0950   guaiFENesin  (MUCINEX ) 12 hr tablet 600 mg  600 mg Oral BID Arne Langdon, MD   600 mg at 02/15/24 0950   ipratropium-albuterol  (DUONEB) 0.5-2.5 (3) MG/3ML nebulizer solution 3 mL  3 mL Nebulization Q6H Arne Langdon, MD   3 mL at 02/15/24 0741   losartan  (COZAAR ) tablet 50 mg  50 mg Oral Daily Pokhrel, Laxman, MD   50 mg at 02/14/24 8119   metoprolol  succinate (TOPROL -XL) 24 hr tablet 100 mg  100 mg Oral Daily Pokhrel, Laxman, MD   100 mg at 02/14/24 0844   midodrine (PROAMATINE) tablet 10 mg  10 mg Oral TID WC Mansy, Jan A, MD   10 mg at 02/15/24 1478   ondansetron  (ZOFRAN ) tablet 8 mg  8 mg Oral BID PRN Moore, Willie, MD       oxyCODONE  (Oxy IR/ROXICODONE ) immediate release tablet 10 mg  10 mg Oral Q4H PRN Arne Langdon, MD   10 mg at 02/14/24 0302   pantoprazole  (PROTONIX ) EC tablet 40 mg  40 mg Oral Daily Pokhrel, Laxman, MD   40 mg at 02/15/24 2956   tamsulosin  (FLOMAX ) capsule 0.4 mg  0.4 mg Oral QPM Arne Langdon, MD   0.4 mg at 02/14/24  1742   warfarin (COUMADIN ) tablet 1 mg  1 mg Oral ONCE-1600 Pokhrel, Laxman, MD       Warfarin - Pharmacist Dosing Inpatient   Does not apply [id number] Arne Langdon, MD   Given at 02/13/24 1633     Discharge Medications: Please see discharge summary for a list of discharge medications.  Relevant Imaging Results:  Relevant Lab Results:   Additional Information [id number]  Zerick Prevette A [place], LCSW

## 2024-02-15 NOTE — Progress Notes (Addendum)
 PHARMACY - ANTICOAGULATION CONSULT NOTE  Pharmacy Consult for warfarin Indication: DVT  Allergies  Allergen Reactions   Codeine Hives and Other (See Comments)    Takes benadryl  to stop allergic reactions    Patient Measurements: Height: 6\' 1"  (185.4 cm) Weight: 91.2 kg (201 lb) IBW/kg (Calculated) : 79.9 HEPARIN  DW (KG): 91.2  Vital Signs: Temp: 99.1 F (37.3 C) (04/30 0434) Temp Source: Oral (04/30 0434) BP: 126/47 (04/30 0757) Pulse Rate: 84 (04/30 0757)  Labs: Recent Labs    02/13/24 0514 02/13/24 0519 02/13/24 0848 02/14/24 0548 02/15/24 0604  HGB 14.9 15.6  --   --  11.3*  HCT 47.4 46.0  --   --  35.5*  PLT 297  --   --   --  217  LABPROT 24.9*  --   --  34.8* 30.5*  INR 2.2*  --   --  3.4* 2.9*  CREATININE 1.84*  --   --   --  2.06*  TROPONINIHS 42*  --  41*  --   --     Estimated Creatinine Clearance: 33.4 mL/min (A) (by C-G formula based on SCr of 2.06 mg/dL (H)).   Medical History: Past Medical History:  Diagnosis Date   AAA (abdominal aortic aneurysm) (HCC)    Blood dyscrasia    followed by Dr. Scherrie Curt, for clotting issue, has been on Coumadin  for about 20 yrs.     CAD (coronary artery disease)    Chronic kidney disease    COPD (chronic obstructive pulmonary disease) (HCC)    Diabetes mellitus without complication (HCC)    DJD (degenerative joint disease)    Dyslipidemia    Dyspnea    Gout    Malignant hypertension    Peripheral vascular disease (HCC)    Pneumonia 08/18/2013   pt. 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 Arlester Ladd and Dr. Sherene Dilling   Severe aortic stenosis    Venous thrombosis    Recurrent    Assessment: 78 y.o. male admitted for treatment of community-acquiren pneumonia.  He is treated with Warfarin PTA for recurrent  DVT/PE. Pharmacy consulted for warfarin management during his admission.   PTA warfarin regimen - 2 mg on weekdays and 4 mg on weekends.    He is currently treated with azithromycin  resulting in elevated INR on PTA regimen. Last dose of azithromycin  planned for 4/30. Today INR 2.9 after warfarin was held 4/29. Hgb 11.3, Hct 35.5, no bleeding noted per RN  Goal of Therapy:  INR 2-3 Monitor platelets by anticoagulation protocol: Yes   Plan:  Warfarin 1mg  with last dose of azithro today Monitor INR daily given significant drug interaction.  Homer Lust, PharmD 02/15/2024 8:49 AM

## 2024-02-15 NOTE — Evaluation (Signed)
 Occupational Therapy Evaluation Patient Details Name: Tony Zhang MRN: [id number] DOB: 26-Jun-1946 Today's Date: 02/15/2024   History of Present Illness   78 y.o. male presents to [facility] hospital on 02/13/2024 with DOE. Chest x-ray concerning for LLL CAP. PMH includes COPD, CAD, heart failure, PAD, TAVR, DVT and PE, HTN, HLD, emphysema.     Clinical Impressions Pt lives alone, is ind at baseline with ADLs and uses cane for mobility. Wears 1L O2 at home PRN. Pt currently needing up to min A for ADLs, and CGA for transfers with RW. Pt SpO2 down to 88-89% on 4L O2 with ambulation, incr to low 90s on 6L O2. Pt provided with IS and educated on use, pt able to demo. Pt presenting with impairments listed below, will follow acutely. Recommend HHOT at d/c.      If plan is discharge home, recommend the following:   A little help with walking and/or transfers;A little help with bathing/dressing/bathroom;Assistance with cooking/housework;Assist for transportation;Help with stairs or ramp for entrance     Functional Status Assessment   Patient has had a recent decline in their functional status and demonstrates the ability to make significant improvements in function in a reasonable and predictable amount of time.     Equipment Recommendations   None recommended by OT     Recommendations for Other Services   PT consult     Precautions/Restrictions   Precautions Precautions: Fall Recall of Precautions/Restrictions: Intact Precaution/Restrictions Comments: watch O2 Restrictions Weight Bearing Restrictions Per Provider Order: No     Mobility Bed Mobility               General bed mobility comments: seated EOB upon arrival and departure    Transfers Overall transfer level: Needs assistance Equipment used: Rolling walker (2 wheels) Transfers: Sit to/from Stand Sit to Stand: Contact guard assist                  Balance Overall balance assessment: Needs  assistance Sitting-balance support: No upper extremity supported, Feet supported Sitting balance-Leahy Scale: Good     Standing balance support: Bilateral upper extremity supported, Reliant on assistive device for balance Standing balance-Leahy Scale: Poor Standing balance comment: RW for ambulation                           ADL either performed or assessed with clinical judgement   ADL Overall ADL's : Needs assistance/impaired Eating/Feeding: Set up;Sitting Eating/Feeding Details (indicate cue type and reason): drinking diet coke Grooming: Set up;Sitting;Standing   Upper Body Bathing: Minimal assistance;Sitting   Lower Body Bathing: Minimal assistance;Sitting/lateral leans   Upper Body Dressing : Minimal assistance;Sitting   Lower Body Dressing: Minimal assistance;Sitting/lateral leans;Sit to/from stand   Toilet Transfer: Contact guard assist;Ambulation;Rolling walker (2 wheels);Regular Toilet   Toileting- Clothing Manipulation and Hygiene: Contact guard assist       Functional mobility during ADLs: Contact guard assist;Rolling walker (2 wheels)       Vision   Vision Assessment?: No apparent visual deficits     Perception Perception: Not tested       Praxis Praxis: Not tested       Pertinent Vitals/Pain Pain Assessment Pain Assessment: Faces Pain Score: 8  Faces Pain Scale: Hurts whole lot Pain Location: L rib pain Pain Descriptors / Indicators: Discomfort Pain Intervention(s): Limited activity within patient's tolerance, Monitored during session, Repositioned     Extremity/Trunk Assessment Upper Extremity Assessment Upper Extremity Assessment: Overall WFL for  tasks assessed   Lower Extremity Assessment Lower Extremity Assessment: Defer to PT evaluation   Cervical / Trunk Assessment Cervical / Trunk Assessment: Kyphotic   Communication Communication Communication: No apparent difficulties   Cognition Arousal: Alert Behavior During  Therapy: WFL for tasks assessed/performed Cognition: No apparent impairments                               Following commands: Intact       Cueing  General Comments   Cueing Techniques: Verbal cues  SpO2 88-92% on 4-6L during session;  provided IS and educated on use, pt able to demo x2 but then stated he does not want to use it   Exercises     Shoulder Instructions      Home Living Family/patient expects to be discharged to:: Private residence Living Arrangements: Alone Available Help at Discharge: Friend(s);Available PRN/intermittently Type of Home: Mobile home Home Access: Stairs to enter Entrance Stairs-Number of Steps: 4 Entrance Stairs-Rails: Right;Left Home Layout: One level     Bathroom Shower/Tub: Chief Strategy Officer: Standard     Home Equipment: Agricultural consultant (2 wheels);Rollator (4 wheels);Cane - single point;BSC/3in1;Wheelchair - manual   Additional Comments: 1L O2 PRN      Prior Functioning/Environment Prior Level of Function : Independent/Modified Independent;Driving             Mobility Comments: ambulating with SPC since prior hospitalization ADLs Comments: ind wtih ADLs, IADLs, manges own meds, uses BSC as shower seat for home    OT Problem List: Decreased strength;Decreased range of motion;Decreased activity tolerance;Impaired balance (sitting and/or standing)   OT Treatment/Interventions: Self-care/ADL training;Therapeutic exercise;Energy conservation;DME and/or AE instruction;Therapeutic activities;Patient/family education;Balance training;Cognitive remediation/compensation      OT Goals(Current goals can be found in the care plan section)   Acute Rehab OT Goals Patient Stated Goal: none stated OT Goal Formulation: With patient Time For Goal Achievement: 02/29/24 Potential to Achieve Goals: Good ADL Goals Pt Will Perform Upper Body Dressing: Independently;sitting Pt Will Perform Lower Body Dressing:  Independently;sitting/lateral leans;sit to/from stand Pt Will Transfer to Toilet: Independently;ambulating;regular height toilet Pt Will Perform Tub/Shower Transfer: Tub transfer;Shower transfer;Independently;ambulating Additional ADL Goal #1: Pt will tolerate OOB activity with SpO2 >90% in order to improve activity tolerance for ADLs.   OT Frequency:  Min 2X/week    Co-evaluation              AM-PAC OT "6 Clicks" Daily Activity     Outcome Measure Help from another person eating meals?: A Little Help from another person taking care of personal grooming?: A Little Help from another person toileting, which includes using toliet, bedpan, or urinal?: A Little Help from another person bathing (including washing, rinsing, drying)?: A Little Help from another person to put on and taking off regular upper body clothing?: A Little Help from another person to put on and taking off regular lower body clothing?: A Little 6 Click Score: 18   End of Session Equipment Utilized During Treatment: Gait belt;Rolling walker (2 wheels);Oxygen (4-6L) Nurse Communication: Mobility status  Activity Tolerance: Patient tolerated treatment well Patient left: in bed;with call bell/phone within reach;with bed alarm set  OT Visit Diagnosis: Unsteadiness on feet (R26.81);Other abnormalities of gait and mobility (R26.89);Muscle weakness (generalized) (M62.81)                Time: 1440-1506 OT Time Calculation (min): 26 min Charges:  OT General Charges $OT Visit:  1 Visit OT Evaluation $OT Eval Low Complexity: 1 Low OT Treatments $Self Care/Home Management : 8-22 mins  Lashawnna Lambrecht K, OTD, OTR/L [employer]Chat [facility]erred Acute Rehab [phone]   Antionette Kirks 02/15/2024, 4:35 PM

## 2024-02-15 NOTE — Progress Notes (Addendum)
 PROGRESS NOTE  Tony Zhang ZOX:[id number] DOB: [birth year] DOA: 02/13/2024 PCP: Spero Dye, PA-C   LOS: 2 days   Brief narrative:   Tony Zhang is a 78 y.o. 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  In the ED, patient was tachycardic tachypneic but afebrile.  Initial labs showed leukocytosis.  BNP elevated at 277.  Chemistry was notable for creatinine of 1.8.  COVID influenza and RSV was negative.  X-ray of the chest was positive for infiltrate in the left lower lobe.  Blood cultures were drawn and patient was admitted hospital for further evaluation and treatment.     Assessment/Plan: Principal Problem:   Acute hypoxemic respiratory failure (HCC)  Sepsis secondary to left lower lobe community-acquired pneumonia.  Sepsis ruled in.  Patient did have features of sepsis including tachypnea,, tachycardia, leukocytosis with left pulmonary infiltrates causing increased oxygen demand and organ dysfunction being acute hypoxic respiratory failure.  Continue Rocephin  and Zithromax .  Blood cultures negative in 1 day.  Lactate was 1.8.  Continue DuoNebs.  Will discontinue IV fluids.  Patient received 3 and half liters of IV fluid bolus in the ED.  Currently on 6 L oxygen by nasal cannula which has been down from 15 L nonrebreather mask on presentation..  Will continue to wean oxygen as able.  Will add incentive spirometry flutter valve.  Patient is still symptomatic with cough congestion and hypoxia.  Temperature max of 99.3 F.   Elevated BNP,BLE edema, mild, present on admission Chronic diastolic heart failure. Severe aortic stenosis status post TAVR. On oral Lasix  at home.  Dietary changes discussed with the patient.  Dose of Lasix  was  increased to 40 mg from 20 from home will hold if blood pressure is low..  Patient is on Jardiance , Lipitor, Lasix , losartan  and metoprolol  at home but will continue with holding parameters..  Hold Jardiance  for now.  Continue antihypertensives with holding parameters.  Off IV fluids.  Will continue to monitor.  Blood pressure marginally low so we will continue with midodrine.  Review of her recent 2D echocardiogram from 12/07/2023 showed EF of 60 to 65% with moderate LVH.  Mild abdominal discomfort.  History of cholecystectomy.  On Protonix  and Colace.  Chronic kidney disease stage IIIb.  Continue to monitor while on diuretics.  Check BMP in AM.  Diabetes mellitus type 2.  On Jardiance  at home.  Continue diabetic diet.  Latest hemoglobin A1c of 5.4.  On regular diet.  Essential hypertension.   Hypotension at rest.  Continue losartan  and metoprolol  from home with holding parameters for low blood pressure.  Orthostatic vitals were negative yesterday.  Continue midodrine.   COPD Continue bronchodilators.  Continue supplemental oxygen on 6 L.  Add incentive spirometer and flutter valve.  Will continue to wean oxygen as able.   History of DVT/PE Continue warfarin.  Pharmacy to dose.  INR today at 3.4.  Debility weakness.  PT OT has recommended skilled nursing facility placement.  Patient however refuses skilled nursing facility as well as home health at this time.  DVT prophylaxis: On Coumadin  warfarin (COUMADIN ) tablet 1 mg   Disposition: Uncertain at this time.  Patient refusing skilled nursing facility and home health.  Not ready for discharge yet.  Status is: Inpatient Remains inpatient appropriate because: Pending clinical improvement, on supplemental  oxygen, IV antibiotics, likely need for rehabilitation but patient refusing.    Code Status:     Code Status: Full Code  Family Communication:   I was unable to reach the patient's friend  02/14/2024. Consultants: None  Procedures: None  Anti-infectives:  None  Anti-infectives (From admission, onward)    Start     Dose/Rate Route Frequency Ordered Stop   02/14/24 1000  azithromycin  (ZITHROMAX ) tablet 500 mg        500 mg Oral Daily 02/13/24 0933 02/15/24 0950   02/14/24 0000  cefTRIAXone  (ROCEPHIN ) 1 g in sodium chloride  0.9 % 100 mL IVPB        1 g 200 mL/hr over 30 Minutes Intravenous Every 24 hours 02/13/24 0933 02/17/24 2359   02/13/24 0600  cefTRIAXone  (ROCEPHIN ) 1 g in sodium chloride  0.9 % 100 mL IVPB        1 g 200 mL/hr over 30 Minutes Intravenous Once 02/13/24 0556 02/13/24 0643   02/13/24 0600  azithromycin  (ZITHROMAX ) 500 mg in sodium chloride  0.9 % 250 mL IVPB        500 mg 250 mL/hr over 60 Minutes Intravenous  Once 02/13/24 0556 02/13/24 0748   02/13/24 0600  vancomycin  (VANCOCIN ) IVPB 1000 mg/200 mL premix  Status:  Discontinued        1,000 mg 200 mL/hr over 60 Minutes Intravenous  Once 02/13/24 0556 02/13/24 0557   02/13/24 0600  vancomycin  (VANCOREADY) IVPB 2000 mg/400 mL        2,000 mg 200 mL/hr over 120 Minutes Intravenous  Once 02/13/24 0557 02/13/24 1039        Subjective: Today, patient was seen and examined at bedside.  Patient complains of shortness of breath dyspnea cough congestion but he states that he wishes to go home on discharge.  PT has recommended skilled nursing facility.  Social worker at bedside.  Patient is still wishing to go home and does not wish home health as well.  Unsafe for disposition at this time.  Still symptomatic with 6 L of oxygen.  Objective: Vitals:   02/15/24 0757 02/15/24 0958  BP: (!) 126/47 (!) 91/28  Pulse: 84 92  Resp:  18  Temp:  99.2 F (37.3 C)  SpO2: 96% 95%    Intake/Output Summary (Last 24 hours) at 02/15/2024 1037 Last data filed at 02/15/2024 0751 Gross per 24 hour  Intake --  Output 2292 ml  Net -2292 ml   Filed Weights   02/13/24 0525  Weight: 91.2 kg   Body mass index is  26.52 kg/m.   Physical Exam:  GENERAL: Patient is alert awake and oriented. Not in obvious distress.  On 6 L of oxygen by nasal cannula, appears to be weak and deconditioned, Communicative, appears to be deconditioned. HENT: No scleral pallor or icterus. Pupils equally reactive to light. Oral mucosa is moist NECK: is supple, no gross swelling noted. CHEST: Diminished breath sounds bilaterally.  Bilateral coarse breath sounds are noted with some rhonchi. CVS: S1 and S2 heard, ABDOMEN: Soft, non-tender, bowel sounds are present.  Laparoscopic scars healed. EXTREMITIES: No edema. CNS: Cranial nerves are intact. No focal motor deficits. SKIN: warm and dry without rashes.  Data Review: I have personally reviewed the following laboratory data and studies,  CBC: Recent Labs  Lab 02/13/24 0514 02/13/24 0519 02/15/24 0604  WBC 13.1*  --  8.8  NEUTROABS 11.9*  --   --   HGB 14.9 15.6 11.3*  HCT 47.4 46.0 35.5*  MCV  97.3  --  97.0  PLT 297  --  217   Basic Metabolic Panel: Recent Labs  Lab 02/13/24 0514 02/13/24 0519 02/15/24 0604  NA 136 138 136  K 4.3 4.3 4.2  CL 105  --  108  CO2 20*  --  19*  GLUCOSE 76  --  83  BUN 22  --  35*  CREATININE 1.84*  --  2.06*  CALCIUM  9.1  --  7.8*  MG  --   --  1.5*  PHOS  --   --  3.8   Liver Function Tests: Recent Labs  Lab 02/13/24 0514  AST 33  ALT 14  ALKPHOS 42  BILITOT 1.2  PROT 6.4*  ALBUMIN 3.2*   No results for input(s): "LIPASE", "AMYLASE" in the last 168 hours. No results for input(s): "AMMONIA" in the last 168 hours. Cardiac Enzymes: No results for input(s): "CKTOTAL", "CKMB", "CKMBINDEX", "TROPONINI" in the last 168 hours. BNP (last 3 results) Recent Labs    11/12/23 0320 01/20/24 0405 02/13/24 0514  BNP 255.0* 426.2* 277.5*    ProBNP (last 3 results) No results for input(s): "PROBNP" in the last 8760 hours.  CBG: No results for input(s): "GLUCAP" in the last 168 hours. Recent Results (from the past 240  hours)  Resp panel by RT-PCR (RSV, Flu A&B, Covid) Anterior Nasal Swab     Status: None   Collection Time: 02/13/24  5:14 AM   Specimen: Anterior Nasal Swab  Result Value Ref Range Status   SARS Coronavirus 2 by RT PCR NEGATIVE NEGATIVE Final   Influenza A by PCR NEGATIVE NEGATIVE Final   Influenza B by PCR NEGATIVE NEGATIVE Final    Comment: (NOTE) The Xpert Xpress SARS-CoV-2/FLU/RSV plus assay is intended as an aid in the diagnosis of influenza from Nasopharyngeal swab specimens and should not be used as a sole basis for treatment. Nasal washings and aspirates are unacceptable for Xpert Xpress SARS-CoV-2/FLU/RSV testing.  Fact Sheet for Patients: BloggerCourse.com  Fact Sheet for Healthcare Providers: SeriousBroker.it  This test is not yet approved or cleared by the [place]  FDA and has been authorized for detection and/or diagnosis of SARS-CoV-2 by FDA under an Emergency Use Authorization (EUA). This EUA will remain in effect (meaning this test can be used) for the duration of the COVID-19 declaration under Section 564(b)(1) of the Act, 21 U.S.C. section 360bbb-3(b)(1), unless the authorization is terminated or revoked.     Resp Syncytial Virus by PCR NEGATIVE NEGATIVE Final    Comment: (NOTE) Fact Sheet for Patients: BloggerCourse.com  Fact Sheet for Healthcare Providers: SeriousBroker.it  This test is not yet approved or cleared by the [place]  FDA and has been authorized for detection and/or diagnosis of SARS-CoV-2 by FDA under an Emergency Use Authorization (EUA). This EUA will remain in effect (meaning this test can be used) for the duration of the COVID-19 declaration under Section 564(b)(1) of the Act, 21 U.S.C. section 360bbb-3(b)(1), unless the authorization is terminated or revoked.  Performed at [facility], [address], [place],  [place] [address]   Blood Culture (routine x 2)     Status: None (Preliminary result)   Collection Time: 02/13/24  6:13 AM   Specimen: BLOOD  Result Value Ref Range Status   Specimen Description BLOOD LEFT ANTECUBITAL  Final   Special Requests   Final    BOTTLES DRAWN AEROBIC AND ANAEROBIC Blood Culture results may not be optimal due to an inadequate volume of blood received  in culture bottles   Culture   Final    NO GROWTH 2 DAYS Performed at [facility], [address] [place], [place] [address]    Report Status PENDING  Incomplete  Blood Culture (routine x 2)     Status: None (Preliminary result)   Collection Time: 02/13/24  6:20 AM   Specimen: BLOOD LEFT HAND  Result Value Ref Range Status   Specimen Description BLOOD LEFT HAND  Final   Special Requests   Final    BOTTLES DRAWN AEROBIC AND ANAEROBIC Blood Culture adequate volume   Culture   Final    NO GROWTH 2 DAYS Performed at [facility], [address], [place], [place] [address]    Report Status PENDING  Incomplete     Studies: No results found.     Cordell Coke, MD  [facility] 02/15/2024  If 7PM-7AM, please contact night-coverage

## 2024-02-15 NOTE — Progress Notes (Signed)
 Mobility Specialist: Progress Note   02/15/24 1520  Mobility  Activity Ambulated with assistance in hallway  Level of Assistance Standby assist, set-up cues, supervision of patient - no hands on  Assistive Device Front wheel walker  Distance Ambulated (ft) 100 ft  Activity Response Tolerated well  Mobility Referral Yes  Mobility visit 1 Mobility  Mobility Specialist Start Time (ACUTE ONLY) 1423  Mobility Specialist Stop Time (ACUTE ONLY) 1437  Mobility Specialist Time Calculation (min) (ACUTE ONLY) 14 min    Pre-Mobility: SpO2 97% 4.5LO2 During Mobility: SpO2 86-93% 4LO2 Post-Mobility: SpO2 94% 4LO2  Pt requested a second mobility session in the afternoon. Received in bed - agreeable to mobility. SV throughout. Received on 4.5LO2, SpO2 97%. Ambulated on 4LO2. Desat 2x to SpO2 86-88% on 4LO2 but recovered quickly to SpO2 91-93% with standing break and cues for PLB. No complaints of dizziness this session. Returned to room without fault. Left on EOB with all needs met, call bell in reach.    Deloria Fetch Mobility Specialist Please contact via SecureChat or Rehab office at [phone]

## 2024-02-15 NOTE — Progress Notes (Signed)
 Patient BP is 91/28 held BP medications. Notified MD.

## 2024-02-15 NOTE — Progress Notes (Signed)
 Mobility Specialist: Progress Note   02/15/24 1515  Mobility  Activity Ambulated with assistance in hallway  Level of Assistance Contact guard assist, steadying assist  Assistive Device Front wheel walker  Distance Ambulated (ft) 150 ft  Activity Response Tolerated well  Mobility Referral Yes  Mobility visit 1 Mobility  Mobility Specialist Start Time (ACUTE ONLY) 0850  Mobility Specialist Stop Time (ACUTE ONLY) 0914  Mobility Specialist Time Calculation (min) (ACUTE ONLY) 24 min    Pre-Mobility: SpO2 97% 6LO2 HFNC During Mobility: SpO2 90-95% 6LO2 HFNC Post-Mobility: SpO2 96% 6LO2 HFNC  Pt was pleasant and agreeable to mobility session - received in bed. Sv for bed mobility. Light minA to boost to stand. CG for ambulation. Instructed to try to look up when he walks but he says he feels dizzy when he does. SpO2 90-95% on 6LO2, c/o slight SOB. Ambulated ~75 ' , then returned to the room for a seated break and ambulated the same distance again. Left on EOB with all needs met, call bell in reach.   Deloria Fetch Mobility Specialist Please contact via SecureChat or Rehab office at [phone]

## 2024-02-15 NOTE — TOC Progression Note (Signed)
 Transition of Care [facility]) - Progression Note    Patient Details  Name: Tony Zhang MRN: [id number] Date of Birth: 1946-08-27  Transition of Care [facility]) CM/SW Contact  Dane Dung, RN Phone Number: 02/15/2024, 10:15 AM  Clinical Narrative:    CM met with the patient at the bedside to discuss discharge needs.  Patient requiring a lot of assistance.  Patient states that he was able to get up in the hospital room last night to urinate in the trashcan in the hospital room.  Bedside nursing reports that patient was confused last night.  Patient is oriented today and continues to decline SNF placement and wants to go home when he is stable.  Patient lives alone and has 2 available friends that can assist but do not provide 24 hour supervision in his mobile home.  Patient declines home health services and wheelchair.  DME at the home includes home oxygen, shower seat, RW, rolator, Cane and 3:1.  I recommended WC to the patient and he declines wheelchair for home.  Patient remains on 6L/min [place] oxygen with humidification at this time.  Patient states that he plans to call his friend Stana Ear  when he is table to return home - [phone].  Patient remains inpatient at this time on oxygen at 6l/min [place].  Patient has home oxygen through [employer].  Patient currently refusing SNF placement and alos declines home health agency in his home.        Expected Discharge Plan and Services                                               Social Determinants of Health (SDOH) Interventions SDOH Screenings   Food Insecurity: No Food Insecurity (02/13/2024)  Housing: Low Risk  (02/13/2024)  Transportation Needs: No Transportation Needs (02/13/2024)  Utilities: Not At Risk (02/13/2024)  Alcohol Screen: Low Risk  (06/04/2022)  Financial Resource Strain: Low Risk  (06/04/2022)  Social Connections: Socially Isolated (02/13/2024)  Tobacco Use: Medium Risk (02/13/2024)    Readmission Risk  Interventions    11/24/2023   12:05 PM  Readmission Risk Prevention Plan  Transportation Screening Complete  Home Care Screening Complete  Medication Review (RN CM) Complete

## 2024-02-16 DIAGNOSIS — J9601 Acute respiratory failure with hypoxia: Secondary | ICD-10-CM | POA: Diagnosis not present

## 2024-02-16 LAB — BASIC METABOLIC PANEL WITH GFR
Anion gap: 10 (ref 5–15)
BUN: 25 mg/dL — ABNORMAL HIGH (ref 8–23)
CO2: 20 mmol/L — ABNORMAL LOW (ref 22–32)
Calcium: 8.5 mg/dL — ABNORMAL LOW (ref 8.9–10.3)
Chloride: 108 mmol/L (ref 98–111)
Creatinine, Ser: 1.49 mg/dL — ABNORMAL HIGH (ref 0.61–1.24)
GFR, Estimated: 48 mL/min — ABNORMAL LOW (ref >60)
Glucose, Bld: 104 mg/dL — ABNORMAL HIGH (ref 70–99)
Potassium: 4 mmol/L (ref 3.5–5.1)
Sodium: 138 mmol/L (ref 135–145)

## 2024-02-16 LAB — CBC
HCT: 35.7 % — ABNORMAL LOW (ref 39.0–52.0)
Hemoglobin: 11.4 g/dL — ABNORMAL LOW (ref 13.0–17.0)
MCH: 30.5 pg (ref 26.0–34.0)
MCHC: 31.9 g/dL (ref 30.0–36.0)
MCV: 95.5 fL (ref 80.0–100.0)
Platelets: 231 10*3/uL (ref 150–400)
RBC: 3.74 MIL/uL — ABNORMAL LOW (ref 4.22–5.81)
RDW: 17.2 % — ABNORMAL HIGH (ref 11.5–15.5)
WBC: 6.5 10*3/uL (ref 4.0–10.5)
nRBC: 0 % (ref 0.0–0.2)

## 2024-02-16 LAB — PROTIME-INR
INR: 2.2 — ABNORMAL HIGH (ref 0.8–1.2)
Prothrombin Time: 24.4 s — ABNORMAL HIGH (ref 11.4–15.2)

## 2024-02-16 LAB — MAGNESIUM: Magnesium: 1.6 mg/dL — ABNORMAL LOW (ref 1.7–2.4)

## 2024-02-16 MED ORDER — WARFARIN SODIUM 4 MG PO TABS
4.0000 mg | ORAL_TABLET | Freq: Once | ORAL | Status: AC
  Administered 2024-02-16: 4 mg via ORAL
  Filled 2024-02-16: qty 1

## 2024-02-16 MED ORDER — MAGNESIUM SULFATE 2 GM/50ML IV SOLN
2.0000 g | Freq: Once | INTRAVENOUS | Status: AC
  Administered 2024-02-16: 2 g via INTRAVENOUS
  Filled 2024-02-16: qty 50

## 2024-02-16 MED ORDER — IPRATROPIUM-ALBUTEROL 0.5-2.5 (3) MG/3ML IN SOLN
3.0000 mL | Freq: Two times a day (BID) | RESPIRATORY_TRACT | Status: DC
  Administered 2024-02-16 – 2024-02-17 (×2): 3 mL via RESPIRATORY_TRACT
  Filled 2024-02-16 (×2): qty 3

## 2024-02-16 NOTE — Progress Notes (Signed)
 Mobility Specialist Progress Note:   02/16/24 1452  Mobility  Activity Ambulated with assistance in hallway  Level of Assistance Standby assist, set-up cues, supervision of patient - no hands on  Assistive Device Front wheel walker  Distance Ambulated (ft) 100 ft  Activity Response Tolerated well  Mobility Referral Yes  Mobility visit 1 Mobility  Mobility Specialist Start Time (ACUTE ONLY) 1130  Mobility Specialist Stop Time (ACUTE ONLY) 1145  Mobility Specialist Time Calculation (min) (ACUTE ONLY) 15 min   Pt received in bed eager for mobility. No physical assistance needed contact guard for safety. Ambulated on 4L/min [place]. Returned to room w/o fault. Left seated EOB w/ call bell and personal belongings in reach. All needs met.  Tony Zhang Mobility Specialist  Please contact vis Secure Chat or  Rehab Office [phone]

## 2024-02-16 NOTE — Progress Notes (Signed)
 SATURATION QUALIFICATIONS: (This note is used to comply with regulatory documentation for home oxygen)  Patient Saturations on Room Air at Rest = 90%  Patient Saturations on Room Air while Ambulating = 82%  Patient Saturations on 2 Liters of oxygen while Ambulating = 88%  Please briefly explain why patient needs home oxygen: Pt hypoxic on RA with exertion.

## 2024-02-16 NOTE — Care Management Important Message (Signed)
 Important Message  Patient Details  Name: Tony Zhang MRN: [id number] Date of Birth: 22-Feb-1946   Important Message Given:  Yes - Medicare IM     Wynonia Hedges 02/16/2024, 3:12 PM

## 2024-02-16 NOTE — Progress Notes (Signed)
 Physical Therapy Treatment Patient Details Name: Tony Zhang MRN: [id number] DOB: Sep 23, 1946 Today's Date: 02/16/2024   History of Present Illness 78 y.o. male [facility] to [facility] hospital on 02/13/[facility]24 with DOE. Chest x-ray concerning for LLL CAP. PMH includes COPD, CAD, heart failure, PAD, TAVR, DVT and PE, HTN, HLD, emphysema.    PT Comments  Pt received sitting EOB, agreeable to therapy session with emphasis on transfer, gait and stair training. Pt needing up to CGA for transfer safety with BUE support and gait with RW. Pt performed stepping up/down 7" platform x10 reps for BLE strengthening, but needed seated rest break ~5 mins to recover after this prior to gait trial in hallway with supplemental O2 needed. Pt reports he is reluctant to use supplemental O2 at home "because I can tell when I'm winded at the flea market and need to rest". PTA reinforced with him importance of self-monitoring O2 levels via portable pulse ox as he was hypoxic on RA and briefly on supplemental O2 with exertion, but recovers much more quickly on supplemental O2 2L via [place]. Patient will benefit from continued inpatient follow up therapy, <3 hours/day, however may refuse post-acute rehab in which case he would benefit from max [facility] services or OPPT if [facility] not available.  Patient Saturations on Room Air at Rest = 90% Patient Saturations on [employer] while Ambulating = 82% Patient Saturations on 2 Liters of oxygen while Ambulating = 88%   If plan is discharge home, recommend the following: A lot of help with walking and/or transfers;A lot of help with bathing/dressing/bathroom;Assistance with cooking/housework;Assist for transportation;Help with stairs or ramp for entrance   Can travel by private vehicle     Yes  Equipment Recommendations  Wheelchair (measurements PT);Wheelchair cushion (measurements PT) (vs rollator if pt refuses wheelchair)    Recommendations for Other Services       Precautions / Restrictions  Precautions Precautions: Fall Recall of Precautions/Restrictions: Intact Precaution/Restrictions Comments: watch O2 Restrictions Weight Bearing Restrictions Per Provider Order: No     Mobility  Bed Mobility Overal bed mobility: Needs Assistance             General bed mobility comments: seated EOB upon arrival and departure    Transfers Overall transfer level: Needs assistance Equipment used: Rolling walker (2 wheels) Transfers: Sit to/from Stand Sit to Stand: Contact guard assist, From elevated surface           General transfer comment: requires assistance to power up due to LE weakness. Pt refuses attempts to transfer to chair and " not ready" to transfer to bathroom at end of session.    Ambulation/Gait Ambulation/Gait assistance: Supervision Gait Distance (Feet): 100 Feet Assistive device: Rolling walker (2 wheels) Gait Pattern/deviations: Step-through pattern   Gait velocity interpretation: <1.8 ft/sec, indicate of risk for recurrent falls   General Gait Details: slow cadence, multiple brief standing breaks with cues for pursed-lip breathing; SpO2 briefly desat to 86% on 2L/min O2 [place] but improves to 88% and greater after standing rest break. Pt defers attempting gait without RW although he does not typically use one at home.   Stairs Stairs: Yes Stairs assistance: Contact guard assist Stair Management: Two rails, Step to pattern, Backwards, Forwards, With walker (RW to simulate bil rails) Number of Stairs: 10 General stair comments: single 7" step in room x10 reps with cues for activity pacing/pursed-lip breathing; no buckling or LOB; pt needed seated break to recover prior to gait trial in hallway   Wheelchair Mobility  Tilt Bed    Modified Rankin (Stroke Patients Only)       Balance Overall balance assessment: Needs assistance Sitting-balance support: No upper extremity supported, Feet supported Sitting balance-Leahy Scale: Good      Standing balance support: Bilateral upper extremity supported, Reliant on assistive device for balance Standing balance-Leahy Scale: Poor Standing balance comment: RW for ambulation                            Communication Communication Communication: No apparent difficulties  Cognition Arousal: Alert Behavior During Therapy: WFL for tasks assessed/performed   PT - Cognitive impairments: Safety/Judgement                       PT - Cognition Comments: pt reports non-compliance with supplemental O2 at home and states he plans to furniture walk in room, but pt encouraged to always notify staff when needing to get up to bathroom/out of room due to increased risk of falls, pt defers bed alarm, RN notified. Following commands: Intact      Cueing Cueing Techniques: Verbal cues  Exercises      General Comments General comments (skin integrity, edema, etc.): hypoxic on RA wtih exertion; SpO2 WFL on 2L O2 [place] mostly but does need a couple standing rest breaks with slight desaturation, quickly improves wtih deep breathing      Pertinent Vitals/Pain Pain Assessment Pain Assessment: Faces Faces Pain Scale: Hurts a little bit Pain Location: not localized Pain Descriptors / Indicators: Discomfort, Grimacing Pain Intervention(s): Monitored during session, Repositioned    Home Living                          Prior Function            PT Goals (current goals can now be found in the care plan section) Acute Rehab PT Goals Patient Stated Goal: to return home, feel better PT Goal Formulation: With patient Time For Goal Achievement: 02/28/24 Progress towards PT goals: Progressing toward goals    Frequency    Min 3X/week      PT Plan      Co-evaluation              AM-PAC PT "6 Clicks" Mobility   Outcome Measure  Help needed turning from your back to your side while in a flat bed without using bedrails?: None Help needed moving from lying  on your back to sitting on the side of a flat bed without using bedrails?: A Little (w/o rail) Help needed moving to and from a bed to a chair (including a wheelchair)?: A Little Help needed standing up from a chair using your arms (e.g., wheelchair or bedside chair)?: A Little Help needed to walk in hospital room?: A Little Help needed climbing 3-5 steps with a railing? : A Lot (w/single rail) 6 Click Score: 18    End of Session Equipment Utilized During Treatment: Oxygen Activity Tolerance: Patient tolerated treatment well;Patient limited by fatigue Patient left: in bed;with call bell/phone within reach;Other (comment) (pt refusing bed alarm, RN notified) Nurse Communication: Mobility status;Other (comment) (pt refusing bed alarm; states he plans to remove O2 [place] to walk to bathroom, although still has it on when PTA in room with him) PT Visit Diagnosis: Other abnormalities of gait and mobility (R26.89);Muscle weakness (generalized) (M62.81)     Time: 1308-6578 PT Time Calculation (min) (ACUTE ONLY): 21 min  Charges:    $Gait Training: 8-22 mins PT General Charges $$ ACUTE PT VISIT: 1 Visit                     Arva Slaugh P., PTA Acute Rehabilitation Services Secure Chat Preferred 9a-5:30pm Office: [phone]    Mariel Shope [facility] 02/16/2024, 6:03 PM

## 2024-02-16 NOTE — Progress Notes (Signed)
 PROGRESS NOTE  LAURA BUSTO GNF:[id number] DOB: 07/06/1946 DOA: 02/13/2024 PCP: Spero Dye, PA-C   LOS: 3 days   Brief narrative:   SASHANK Zhang is a 78 y.o. 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  In the ED, patient was tachycardic, tachypneic but afebrile.  Initial labs showed leukocytosis.  BNP elevated at 277.  Chemistry was notable for creatinine of 1.8.  COVID influenza and RSV was negative.  X-ray of the chest was positive for infiltrate in the left lower lobe.  Blood cultures were drawn and patient was admitted hospital for further evaluation and treatment.     Assessment/Plan: Principal Problem:   Acute hypoxemic respiratory failure (HCC)  Sepsis secondary to left lower lobe community-acquired pneumonia.   Continue Rocephin  and Zithromax .  Blood cultures negative in 3 day.  Lactate was 1.8.  Continue DuoNebs.  .  Currently on 4 L oxygen by nasal cannula which has been down from 15 L nonrebreather mask on presentation..  Will continue to wean oxygen as able.  Continue incentive spirometry, flutter valve.  Temperature max of 99.9 F.    Elevated BNP,BLE edema, mild, present on admission Chronic diastolic heart failure. Severe aortic stenosis status post TAVR. On oral Lasix  at home.  Currently on 40 mg of Lasix .  Patient is on Jardiance , Lipitor, Lasix , losartan  and metoprolol  at home.  Hold Jardiance  for now.  Continue antihypertensives with holding parameters.    Blood pressure marginally low so we will continue with midodrine .  Review of  recent 2D echocardiogram from 12/07/2023 showed EF of 60 to 65% with moderate LVH.  Mild abdominal discomfort.  History of cholecystectomy.  On Protonix  and Colace.  Chronic kidney  disease stage IIIb.  Continue to monitor while on diuretics.  Check BMP in AM.  Latest creatinine of 1.4  Hypomagnesemia.  Magnesium  of 1.6 today.  Will replenish through IV.  Check levels in AM.  Diabetes mellitus type 2.  On Jardiance  at home.  Continue diabetic diet.  Latest hemoglobin A1c of 5.4.  On regular diet.  Essential hypertension.   Hypotension at rest.  Continue losartan  and metoprolol  from home with holding parameters for low blood pressure.  Orthostatic vitals were negative yesterday.  Continue midodrine .   COPD Continue bronchodilators.  Continue supplemental oxygen on 4 L.  Continue incentive spirometer and flutter valve.  Will continue to wean oxygen as able.   History of DVT/PE Continue warfarin.  Pharmacy to dose.  INR today at 2.2  Debility weakness.  PT OT has recommended skilled nursing facility placement.  Patient however refuses skilled nursing facility as well as home health at this time.  DVT prophylaxis: On Coumadin  warfarin (COUMADIN ) tablet 4 mg   Disposition: Un likely home if he continues to improve by 5-25  Status is: Inpatient Remains inpatient appropriate because: Pending clinical improvement, on supplemental oxygen, IV antibiotics, likely need for rehabilitation but patient refusing.    Code Status:     Code Status: Full Code  Family Communication: None at bedside  Consultants: None  Procedures: None  Anti-infectives:  None  Anti-infectives (From admission, onward)    Start     Dose/Rate Route Frequency Ordered Stop   02/14/24 1000  azithromycin  (ZITHROMAX ) tablet 500 mg        500 mg Oral  Daily 02/13/24 0933 02/15/24 0950   02/14/24 0000  cefTRIAXone  (ROCEPHIN ) 1 g in sodium chloride  0.9 % 100 mL IVPB        1 g 200 mL/hr over 30 Minutes Intravenous Every 24 hours 02/13/24 0933 02/17/24 2359   02/13/24 0600  cefTRIAXone  (ROCEPHIN ) 1 g in sodium chloride  0.9 % 100 mL IVPB        1 g 200 mL/hr over 30 Minutes Intravenous Once  02/13/24 0556 02/13/24 0643   02/13/24 0600  azithromycin  (ZITHROMAX ) 500 mg in sodium chloride  0.9 % 250 mL IVPB        500 mg 250 mL/hr over 60 Minutes Intravenous  Once 02/13/24 0556 02/13/24 0748   02/13/24 0600  vancomycin  (VANCOCIN ) IVPB 1000 mg/200 mL premix  Status:  Discontinued        1,000 mg 200 mL/hr over 60 Minutes Intravenous  Once 02/13/24 0556 02/13/24 0557   02/13/24 0600  vancomycin  (VANCOREADY) IVPB 2000 mg/400 mL        2,000 mg 200 mL/hr over 120 Minutes Intravenous  Once 02/13/24 0557 02/13/24 1039        Subjective: Today, patient was seen and examined at bedside.  Patient is states that he still feels shortness of breath with cough and congestion but states that he has been able to do some ambulation.  Objective: Vitals:   02/16/24 0733 02/16/24 1141  BP: (!) 135/48 (!) 149/78  Pulse: 86 80  Resp:  18  Temp: 97.8 F (36.6 C) 97.6 F (36.4 C)  SpO2:  96%    Intake/Output Summary (Last 24 hours) at 02/16/2024 1333 Last data filed at 02/16/2024 0854 Gross per 24 hour  Intake 320 ml  Output 725 ml  Net -405 ml   Filed Weights   02/13/24 0525  Weight: 91.2 kg   Body mass index is 26.52 kg/m.   Physical Exam:  GENERAL: Patient is alert awake and oriented. Not in obvious distress.  On 4 L of oxygen by nasal cannula, appears to be weak and deconditioned, Communicative, appears to be deconditioned. HENT: No scleral pallor or icterus. Pupils equally reactive to light. Oral mucosa is moist NECK: is supple, no gross swelling noted. CHEST: Diminished breath sounds bilaterally.  Coarse breath sounds bilaterally CVS: S1 and S2 heard, ABDOMEN: Soft, non-tender, bowel sounds are present.  Laparoscopic scars healed. EXTREMITIES: No edema. CNS: Cranial nerves are intact. No focal motor deficits. SKIN: warm and dry without rashes.  Data Review: I have personally reviewed the following laboratory data and studies,  CBC: Recent Labs  Lab 02/13/24 0514  02/13/24 0519 02/15/24 0604 02/16/24 0638  WBC 13.1*  --  8.8 6.5  NEUTROABS 11.9*  --   --   --   HGB 14.9 15.6 11.3* 11.4*  HCT 47.4 46.0 35.5* 35.7*  MCV 97.3  --  97.0 95.5  PLT 297  --  217 231   Basic Metabolic Panel: Recent Labs  Lab 02/13/24 0514 02/13/24 0519 02/15/24 0604 02/16/24 0638  NA 136 138 136 138  K 4.3 4.3 4.2 4.0  CL 105  --  108 108  CO2 20*  --  19* 20*  GLUCOSE 76  --  83 104*  BUN 22  --  35* 25*  CREATININE 1.84*  --  2.06* 1.49*  CALCIUM  9.1  --  7.8* 8.5*  MG  --   --  1.5* 1.6*  PHOS  --   --  3.8  --  Liver Function Tests: Recent Labs  Lab 02/13/24 0514  AST 33  ALT 14  ALKPHOS 42  BILITOT 1.2  PROT 6.4*  ALBUMIN 3.2*   No results for input(s): "LIPASE", "AMYLASE" in the last 168 hours. No results for input(s): "AMMONIA" in the last 168 hours. Cardiac Enzymes: No results for input(s): "CKTOTAL", "CKMB", "CKMBINDEX", "TROPONINI" in the last 168 hours. BNP (last 3 results) Recent Labs    11/12/23 0320 01/20/24 0405 02/13/24 0514  BNP 255.0* 426.2* 277.5*    ProBNP (last 3 results) No results for input(s): "PROBNP" in the last 8760 hours.  CBG: No results for input(s): "GLUCAP" in the last 168 hours. Recent Results (from the past 240 hours)  Resp panel by RT-PCR (RSV, Flu A&B, Covid) Anterior Nasal Swab     Status: None   Collection Time: 02/13/24  5:14 AM   Specimen: Anterior Nasal Swab  Result Value Ref Range Status   SARS Coronavirus 2 by RT PCR NEGATIVE NEGATIVE Final   Influenza A by PCR NEGATIVE NEGATIVE Final   Influenza B by PCR NEGATIVE NEGATIVE Final    Comment: (NOTE) The Xpert Xpress SARS-CoV-2/FLU/RSV plus assay is intended as an aid in the diagnosis of influenza from Nasopharyngeal swab specimens and should not be used as a sole basis for treatment. Nasal washings and aspirates are unacceptable for Xpert Xpress SARS-CoV-2/FLU/RSV testing.  Fact Sheet for  Patients: BloggerCourse.com  Fact Sheet for Healthcare Providers: SeriousBroker.it  This test is not yet approved or cleared by the [place]  FDA and has been authorized for detection and/or diagnosis of SARS-CoV-2 by FDA under an Emergency Use Authorization (EUA). This EUA will remain in effect (meaning this test can be used) for the duration of the COVID-19 declaration under Section 564(b)(1) of the Act, 21 U.S.C. section 360bbb-3(b)(1), unless the authorization is terminated or revoked.     Resp Syncytial Virus by PCR NEGATIVE NEGATIVE Final    Comment: (NOTE) Fact Sheet for Patients: BloggerCourse.com  Fact Sheet for Healthcare Providers: SeriousBroker.it  This test is not yet approved or cleared by the [place]  FDA and has been authorized for detection and/or diagnosis of SARS-CoV-2 by FDA under an Emergency Use Authorization (EUA). This EUA will remain in effect (meaning this test can be used) for the duration of the COVID-19 declaration under Section 564(b)(1) of the Act, 21 U.S.C. section 360bbb-3(b)(1), unless the authorization is terminated or revoked.  Performed at [facility], [address], [place], [place] [address]   Blood Culture (routine x 2)     Status: None (Preliminary result)   Collection Time: 02/13/24  6:13 AM   Specimen: BLOOD  Result Value Ref Range Status   Specimen Description BLOOD LEFT ANTECUBITAL  Final   Special Requests   Final    BOTTLES DRAWN AEROBIC AND ANAEROBIC Blood Culture results may not be optimal due to an inadequate volume of blood received in culture bottles   Culture   Final    NO GROWTH 3 DAYS Performed at [facility], [address], [place], [place] [address]    Report Status PENDING  Incomplete  Blood Culture (routine x 2)     Status: None (Preliminary result)   Collection Time: 02/13/24  6:20 AM    Specimen: BLOOD LEFT HAND  Result Value Ref Range Status   Specimen Description BLOOD LEFT HAND  Final   Special Requests   Final    BOTTLES DRAWN AEROBIC AND ANAEROBIC Blood Culture adequate volume  Culture   Final    NO GROWTH 3 DAYS Performed at [facility], [address] [place], [place] [address]    Report Status PENDING  Incomplete     Studies: No results found.     Bowman Higbie, MD  [facility] 02/16/2024  If 7PM-7AM, please contact night-coverage

## 2024-02-16 NOTE — Progress Notes (Signed)
 PHARMACY - ANTICOAGULATION CONSULT NOTE  Pharmacy Consult for warfarin Indication: DVT  Allergies  Allergen Reactions   Codeine Hives and Other (See Comments)    Takes benadryl  to stop allergic reactions    Patient Measurements: Height: 6\' 1"  (185.4 cm) Weight: 91.2 kg (201 lb) IBW/kg (Calculated) : 79.9 HEPARIN  DW (KG): 91.2  Vital Signs: Temp: 97.8 F (36.6 C) (05/01 0733) Temp Source: Oral (05/01 0733) BP: 135/48 (05/01 0733) Pulse Rate: 86 (05/01 0733)  Labs: Recent Labs    02/13/24 0848 02/14/24 0548 02/15/24 0604 02/16/24 0638  HGB  --   --  11.3* 11.4*  HCT  --   --  35.5* 35.7*  PLT  --   --  217 231  LABPROT  --  34.8* 30.5* 24.4*  INR  --  3.4* 2.9* 2.2*  CREATININE  --   --  2.06* 1.49*  TROPONINIHS 41*  --   --   --     Estimated Creatinine Clearance: 46.2 mL/min (A) (by C-G formula based on SCr of 1.49 mg/dL (H)).   Medical History: Past Medical History:  Diagnosis Date   AAA (abdominal aortic aneurysm) (HCC)    Blood dyscrasia    followed by Dr. Scherrie Curt, for clotting issue, has been on Coumadin  for about 20 yrs.     CAD (coronary artery disease)    Chronic kidney disease    COPD (chronic obstructive pulmonary disease) (HCC)    Diabetes mellitus without complication (HCC)    DJD (degenerative joint disease)    Dyslipidemia    Dyspnea    Gout    Malignant hypertension    Peripheral vascular disease (HCC)    Pneumonia 08/18/2013   pt. 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 Arlester Ladd and Dr. Sherene Dilling   Severe aortic stenosis    Venous thrombosis    Recurrent    Assessment: 78 y.o. male admitted for treatment of community-acquiren pneumonia.  He is treated with Warfarin PTA for recurrent DVT/PE. Pharmacy consulted for warfarin management  during his admission.   PTA warfarin regimen - 2 mg on weekdays and 4 mg on weekends.    He is currently treated with azithromycin  resulting in elevated INR on PTA regimen. Last dose of azithromycin  planned for 4/30.   INR 2.2  Goal of Therapy:  INR 2-3 Monitor platelets by anticoagulation protocol: Yes   Plan:  Warfarin 4 mg with last dose of azithro today Monitor INR daily given significant drug interaction.  Lennice Quivers, PharmD 02/16/2024 8:20 AM

## 2024-02-16 NOTE — Plan of Care (Signed)

## 2024-02-16 NOTE — Progress Notes (Signed)
 Patient requested to have a catheter placed for overnight. Patient states that when asleep, patient urinates in the bed. Patient educated about catheters and they are not placed for incontinence. Patient educated that catheters could make patients susceptible to infection. Patient insisted that one was needed. NT placed a condom catheter and patient was satisfied with that solution. Patient was notified that it will have to come off in the morning. Patient in agreement. Will continue to monitor this shift.

## 2024-02-17 DIAGNOSIS — J9601 Acute respiratory failure with hypoxia: Secondary | ICD-10-CM | POA: Diagnosis not present

## 2024-02-17 LAB — PROTIME-INR
INR: 1.9 — ABNORMAL HIGH (ref 0.8–1.2)
Prothrombin Time: 21.9 s — ABNORMAL HIGH (ref 11.4–15.2)

## 2024-02-17 MED ORDER — WARFARIN SODIUM 2 MG PO TABS
2.0000 mg | ORAL_TABLET | ORAL | Status: DC
  Filled 2024-02-17: qty 1

## 2024-02-17 MED ORDER — MAGNESIUM 400 MG PO CAPS
1.0000 | ORAL_CAPSULE | Freq: Every day | ORAL | Status: AC
Start: 2024-02-17 — End: 2024-03-18

## 2024-02-17 MED ORDER — WARFARIN SODIUM 4 MG PO TABS
4.0000 mg | ORAL_TABLET | ORAL | Status: AC
  Administered 2024-02-17: 4 mg via ORAL
  Filled 2024-02-17: qty 1

## 2024-02-17 NOTE — TOC Progression Note (Signed)
 Transition of Care [facility]) - Progression Note    Patient Details  Name: Tony Zhang MRN: [id number] Date of Birth: February 09, 1946  Transition of [facility]) CM/SW Contact  Dane Dung, RN Phone Number: 02/17/2024, 10:04 AM  Clinical Narrative:    CM called and spoke with Dr. Efrain Grant and patient continues to decline SNF placement and home health services.  Plan is for patient to discharge home.  I called [employer] this morning requesting delivery of portable oxygen tank to the room this morning.  Bedside nursing to instruct the patient to call for ride home.  Portable oxygen tank to be delivered to the hospital room in the next hour.        Expected Discharge Plan and Services         Expected Discharge Date: 02/17/24                                     Social Determinants of Health (SDOH) Interventions SDOH Screenings   Food Insecurity: No Food Insecurity (02/13/2024)  Housing: Low Risk  (02/13/2024)  Transportation Needs: No Transportation Needs (02/13/2024)  Utilities: Not At Risk (02/13/2024)  Alcohol Screen: Low Risk  (06/04/2022)  Financial Resource Strain: Low Risk  (06/04/2022)  Social Connections: Socially Isolated (02/13/2024)  Tobacco Use: Medium Risk (02/13/2024)    Readmission Risk Interventions    11/24/2023   12:05 PM  Readmission Risk Prevention Plan  Transportation Screening Complete  Home Care Screening Complete  Medication Review (RN CM) Complete

## 2024-02-17 NOTE — Plan of Care (Signed)

## 2024-02-17 NOTE — Progress Notes (Signed)
 Discharge paperwork completed and discussed with pt.  IV removed along with telemetry.  Taken downstairs to private vehicle via wheelchair.  Discharged home in stable condition with all belongings.

## 2024-02-17 NOTE — Discharge Summary (Signed)
 Physician Discharge Summary  Tony Zhang JXB:[id number] DOB: 1946-01-12 DOA: 02/13/2024  PCP: Spero Dye, PA-C  Admit date: 02/13/2024 Discharge date: 02/18/2024  Admitted From: Home  Discharge disposition: Home   Recommendations for Outpatient Follow-Up:   Follow up with your primary care provider in one week.  Check CBC, BMP, magnesium  in the next visit  Discharge Diagnosis:   Principal Problem:   Acute hypoxemic respiratory failure (HCC)   Discharge Condition: Improved.  Diet recommendation: Low sodium, heart healthy.  Carbohydrate-modified.    Wound care: None.  Code status: Full.   History of Present Illness:    Tony Zhang is a 78 y.o. 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  In the ED, patient was tachycardic, tachypneic but afebrile.  Initial labs showed leukocytosis.  BNP elevated at 277.  Chemistry was notable for creatinine of 1.8.  COVID influenza and RSV was negative.  X-ray of the chest was positive for infiltrate in the left lower lobe.  Blood cultures were drawn and patient was admitted hospital for further evaluation and treatment.    Hospital Course:   Following conditions were addressed during hospitalization as listed below,  Sepsis secondary to left lower lobe community-acquired pneumonia.  During hospitalization patient received and completed Rocephin  and Zithromax  course..  Blood cultures negative in 5 day.  Lactate was 1.8.  Patient received DuoNeb supportive care and was initially on 15 L of nonrebreather mask.  At this time patient has been weaned down significantly and oxygen at his baseline oxygen requirement..  Elevated BNP,BLE edema, mild, present on admission Chronic  diastolic heart failure. Severe aortic stenosis status post TAVR. Patient is on Jardiance , Lipitor, Lasix , losartan  and metoprolol  at home.  Review of  recent 2D echocardiogram from 12/07/2023 showed EF of 60 to 65% with moderate LVH.   Mild abdominal discomfort.  History of cholecystectomy.  Improved.  Received Protonix  and Colace.   Chronic kidney disease stage IIIb.    Latest creatinine of 1.4 prior to discharge.  At baseline.   Hypomagnesemia.  Denies during hospitalization.   Diabetes mellitus type 2.  On Jardiance  at home.  Continue diabetic diet.  Latest hemoglobin A1c of 5.4.     Essential hypertension.   On losartan  and metoprolol  from home   COPD Continue bronchodilators on discharge.   History of DVT/PE Continue warfarin.  From home.  Debility weakness.  PT OT has recommended skilled nursing facility placement.  Despite multiple discussions with the patient patient has refused rehabilitation at home versus skilled nursing facility and wishes to be independent at home.  High risk of readmission but he acknowledges this despite multiple efforts at explaining adequate disposition.  Patient patient understands's treatment option plans including risk versus benefits and the importance of rehabilitation.  Disposition.  At this time, patient is stable for disposition home with outpatient PCP follow-up.  Medical Consultants:   None.  Procedures:    None Subjective:   Today, patient was seen and examined at bedside.  Continues to feel better with breathing.  Continues to state that he wishes to go home.  Discharge Exam:   Vitals:   02/17/24 0746 02/17/24 0817  BP:  (!) 133/44  Pulse: 77 92  Resp: 18 16  Temp:  98 F (36.7 C)  SpO2: 95% 93%  Vitals:   02/17/24 0022 02/17/24 0453 02/17/24 0746 02/17/24 0817  BP: (!) 111/49 (!) 146/64  (!) 133/44  Pulse: 78 82 77 92  Resp: 18 18 18 16   Temp: 98.6 F (37 C) 98.2 F (36.8 C)  98 F (36.7 C)  TempSrc: Oral Oral   Oral  SpO2: 91% 94% 95% 93%  Weight:      Height:        General: Alert awake, not in obvious distress, on nasal cannula oxygen, elderly male, oriented, HENT: pupils equally reacting to light,  No scleral pallor or icterus noted. Oral mucosa is moist.  Chest: Decreased breath sounds bilaterally.   CVS: S1 &S2 heard. No murmur.  Regular rate and rhythm. Abdomen: Soft, nontender, nondistended.  Bowel sounds are heard.  Laparoscopic scars from recent surgery. Extremities: No cyanosis, clubbing or edema.  Peripheral pulses are palpable. Psych: Alert, awake and oriented, normal mood CNS:  No cranial nerve deficits.  Power equal in all extremities.   Skin: Warm and dry.  No rashes noted.  The results of significant diagnostics from this hospitalization (including imaging, microbiology, ancillary and laboratory) are listed below for reference.     Diagnostic Studies:   DG Chest Portable 1 View Result Date: 02/13/2024 CLINICAL DATA:  Respiratory distress EXAM: PORTABLE CHEST 1 VIEW COMPARISON:  01/19/2024 FINDINGS: Airspace disease at the left base with adjacent pleural fluid possible. Background of interstitial coarsening from emphysema. Normal heart size and mediastinal contours. Artifact from EKG leads. IMPRESSION: Pneumonia at the left lung base. Electronically Signed   By: Ronnette Coke M.D.   On: 02/13/2024 05:47     Labs:   Basic Metabolic Panel: Recent Labs  Lab 02/13/24 0514 02/13/24 0519 02/15/24 0604 02/16/24 0638  NA 136 138 136 138  K 4.3 4.3 4.2 4.0  CL 105  --  108 108  CO2 20*  --  19* 20*  GLUCOSE 76  --  83 104*  BUN 22  --  35* 25*  CREATININE 1.84*  --  2.06* 1.49*  CALCIUM  9.1  --  7.8* 8.5*  MG  --   --  1.5* 1.6*  PHOS  --   --  3.8  --    GFR Estimated Creatinine Clearance: 46.2 mL/min (A) (by C-G formula based on SCr of 1.49 mg/dL (H)). Liver Function Tests: Recent Labs  Lab 02/13/24 0514  AST 33  ALT 14  ALKPHOS 42  BILITOT 1.2  PROT 6.4*   ALBUMIN 3.2*   No results for input(s): "LIPASE", "AMYLASE" in the last 168 hours. No results for input(s): "AMMONIA" in the last 168 hours. Coagulation profile Recent Labs  Lab 02/13/24 0514 02/14/24 0548 02/15/24 0604 02/16/24 0638 02/17/24 0740  INR 2.2* 3.4* 2.9* 2.2* 1.9*    CBC: Recent Labs  Lab 02/13/24 0514 02/13/24 0519 02/15/24 0604 02/16/24 0638  WBC 13.1*  --  8.8 6.5  NEUTROABS 11.9*  --   --   --   HGB 14.9 15.6 11.3* 11.4*  HCT 47.4 46.0 35.5* 35.7*  MCV 97.3  --  97.0 95.5  PLT 297  --  217 231   Cardiac Enzymes: No results for input(s): "CKTOTAL", "CKMB", "CKMBINDEX", "TROPONINI" in the last 168 hours. BNP: Invalid input(s): "POCBNP" CBG: No results for input(s): "GLUCAP" in the last 168 hours. D-Dimer No results for input(s): "DDIMER" in the last 72 hours. Hgb A1c No results for input(s): "HGBA1C" in the last 72 hours. Lipid Profile No results for input(s): "  CHOL", "HDL", "LDLCALC", "TRIG", "CHOLHDL", "LDLDIRECT" in the last 72 hours. Thyroid function studies No results for input(s): "TSH", "T4TOTAL", "T3FREE", "THYROIDAB" in the last 72 hours.  Invalid input(s): "FREET3" Anemia work up No results for input(s): "VITAMINB12", "FOLATE", "FERRITIN", "TIBC", "IRON ", "RETICCTPCT" in the last 72 hours. Microbiology Recent Results (from the past 240 hours)  Resp panel by RT-PCR (RSV, Flu A&B, Covid) Anterior Nasal Swab     Status: None   Collection Time: 02/13/24  5:14 AM   Specimen: Anterior Nasal Swab  Result Value Ref Range Status   SARS Coronavirus 2 by RT PCR NEGATIVE NEGATIVE Final   Influenza A by PCR NEGATIVE NEGATIVE Final   Influenza B by PCR NEGATIVE NEGATIVE Final    Comment: (NOTE) The Xpert Xpress SARS-CoV-2/FLU/RSV plus assay is intended as an aid in the diagnosis of influenza from Nasopharyngeal swab specimens and should not be used as a sole basis for treatment. Nasal washings and aspirates are unacceptable for Xpert Xpress  SARS-CoV-2/FLU/RSV testing.  Fact Sheet for Patients: BloggerCourse.com  Fact Sheet for Healthcare Providers: SeriousBroker.it  This test is not yet approved or cleared by the [place]  FDA and has been authorized for detection and/or diagnosis of SARS-CoV-2 by FDA under an Emergency Use Authorization (EUA). This EUA will remain in effect (meaning this test can be used) for the duration of the COVID-19 declaration under Section 564(b)(1) of the Act, 21 U.S.C. section 360bbb-3(b)(1), unless the authorization is terminated or revoked.     Resp Syncytial Virus by PCR NEGATIVE NEGATIVE Final    Comment: (NOTE) Fact Sheet for Patients: BloggerCourse.com  Fact Sheet for Healthcare Providers: SeriousBroker.it  This test is not yet approved or cleared by the [place]  FDA and has been authorized for detection and/or diagnosis of SARS-CoV-2 by FDA under an Emergency Use Authorization (EUA). This EUA will remain in effect (meaning this test can be used) for the duration of the COVID-19 declaration under Section 564(b)(1) of the Act, 21 U.S.C. section 360bbb-3(b)(1), unless the authorization is terminated or revoked.  Performed at [facility], [address] [place], [place] [address]   Blood Culture (routine x 2)     Status: None   Collection Time: 02/13/24  6:13 AM   Specimen: BLOOD  Result Value Ref Range Status   Specimen Description BLOOD LEFT ANTECUBITAL  Final   Special Requests   Final    BOTTLES DRAWN AEROBIC AND ANAEROBIC Blood Culture results may not be optimal due to an inadequate volume of blood received in culture bottles   Culture   Final    NO GROWTH 5 DAYS Performed at [facility], [address], [place], [place] [address]    Report Status 02/18/2024 FINAL  Final  Blood Culture (routine x 2)     Status: None   Collection Time: 02/13/24  6:20  AM   Specimen: BLOOD LEFT HAND  Result Value Ref Range Status   Specimen Description BLOOD LEFT HAND  Final   Special Requests   Final    BOTTLES DRAWN AEROBIC AND ANAEROBIC Blood Culture adequate volume   Culture   Final    NO GROWTH 5 DAYS Performed at [facility], [address] [place], [place] [address]    Report Status 02/18/2024 FINAL  Final     Discharge Instructions:   Discharge Instructions     (HEART FAILURE PATIENTS) Call MD:  Anytime you have any of the following symptoms: 1) 3 pound weight  gain in 24 hours or 5 pounds in 1 week 2) shortness of breath, with or without a dry hacking cough 3) swelling in the hands, feet or stomach 4) if you have to sleep on extra pillows at night in order to breathe.   Complete by: As directed    Call MD for:  severe uncontrolled pain   Complete by: As directed    Call MD for:  temperature >100.4   Complete by: As directed    Diet general   Complete by: As directed    Discharge instructions   Complete by: As directed    Follow-up with your primary care provider in 1 week.  Check blood work at that time.  No overexertion.  Seek medical attention for worsening symptoms.  Apply warm compression to the chest wall.  Okay to take Tylenol  for pain.   Increase activity slowly   Complete by: As directed       Allergies as of 02/17/2024       Reactions   Codeine Hives, Other (See Comments)   Takes benadryl  to stop allergic reactions        Medication List     STOP taking these medications    nitroGLYCERIN  0.3 MG SL tablet Commonly known as: NITROSTAT        TAKE these medications    acetaminophen  500 MG tablet Commonly known as: TYLENOL  Take 500 mg by mouth See admin instructions. Take 500mg  (1 tablet) by mouth three to four times a day.   albuterol  108 (90 Base) MCG/ACT inhaler Commonly known as: VENTOLIN  HFA Inhale 2 puffs into the lungs every 4 (four) hours as needed for wheezing or shortness of breath.    allopurinol  300 MG tablet Commonly known as: ZYLOPRIM  Take 300 mg by mouth at bedtime.   atorvastatin  20 MG tablet Commonly known as: LIPITOR Take 1 tablet (20 mg total) by mouth daily.   benzonatate  100 MG capsule Commonly known as: Tessalon  Perles Take 1 capsule (100 mg total) by mouth 3 (three) times daily as needed for cough.   budesonide -formoterol  160-4.5 MCG/ACT inhaler Commonly known as: Symbicort  Inhale 2 puffs into the lungs in the morning and at bedtime.   cilostazol  100 MG tablet Commonly known as: PLETAL  Take 100 mg by mouth daily.   empagliflozin  10 MG Tabs tablet Commonly known as: JARDIANCE  Take 1 tablet (10 mg total) by mouth daily.   Fenofibric Acid  135 MG Cpdr TAKE 1 TABLET BY MOUTH DAILY   furosemide  20 MG tablet Commonly known as: LASIX  Take 1 tablet (20 mg total) by mouth daily as needed.   gabapentin  600 MG tablet Commonly known as: NEURONTIN  Take 600 mg by mouth 3 (three) times daily.   guaiFENesin  600 MG 12 hr tablet Commonly known as: Mucinex  Take 1 tablet (600 mg total) by mouth 2 (two) times daily.   losartan  50 MG tablet Commonly known as: COZAAR  Take 1 tablet (50 mg total) by mouth daily.   Magnesium  400 MG Caps Take 1 capsule by mouth daily.   metoprolol  succinate 100 MG 24 hr tablet Commonly known as: TOPROL -XL Take 1 tablet (100 mg total) by mouth daily.   ondansetron  8 MG tablet Commonly known as: ZOFRAN  Take 8 mg by mouth 2 (two) times daily as needed for vomiting or nausea.   oxyCODONE  5 MG immediate release tablet Commonly known as: Oxy IR/ROXICODONE  Take 1 tablet (5 mg total) by mouth every 4 (four) hours as needed for moderate pain (pain score 4-6).  pyridOXINE  50 MG tablet Commonly known as: B-6 Take 1 tablet (50 mg total) by mouth daily.   Repatha  SureClick 140 MG/ML Soaj Generic drug: Evolocumab  INJECT 140 MGS SUBCUTANEOUSLY EVERY 14 DAYS   tamsulosin  0.4 MG Caps capsule Commonly known as: FLOMAX  Take 0.4  mg by mouth every evening.   Vitamin Deficiency System-B12 1000 MCG/ML Kit Generic drug: Cyanocobalamin  Inject 1,000 mcg into the skin every 30 (thirty) days.   warfarin 2 MG tablet Commonly known as: COUMADIN  Take as directed. If you are unsure how to take this medication, talk to your nurse or doctor. Original instructions: Take 2-4 mg by mouth See admin instructions. Take 2mg  (1 tablet) by mouth on weekdays and 4mg  (2 tablets) on weekends.        Follow-up Information     Nodal, Ulysees Gander, PA-C Follow up in 1 week(s).   Specialty: Physician Assistant Contact information: [address] [place] [address] [phone]                  Time coordinating discharge: 39 minutes  Signed:  Tavaras Goody  [facility] 02/18/2024, 11:48 AM

## 2024-02-17 NOTE — Progress Notes (Signed)
 Occupational Therapy Treatment Patient Details Name: Tony Zhang MRN: [id number] DOB: 24-Jan-1946 Today's Date: 02/17/2024   History of present illness 78 y.o. male presents to [facility] on 02/13/2024 with DOE. Chest x-ray concerning for LLL CAP. PMH includes COPD, CAD, heart failure, PAD, TAVR, DVT and PE, HTN, HLD, emphysema.   OT comments  Pt making progress with functional goals. Pt not wearing O2 upon OT arrival to his room and standing up at recliner. Educated pt using O2 during exertion and use of RW for safety, tub bench for home use (pt states, I'll pass on that), grab bars at toilet and in tub shower for safety and having a pulse ox to self monitor O2 SATs. Reinforced importance of self-monitoring O2 levels via portable pulse ox as he was hypoxic on RA and reports that he turned O2 down to 2L himself briefly on supplemental. Pt participated in functional mobility using RW to walk to bathroom for toilet transfers, toileting, shower transfers, standing at sink for ADLs and hygiene tasks. Pt instructed on dep, pursed lip breathing as well to help recover O2 levels to stable. Patient will benefit from continued inpatient follow up therapy, <3 hours/day, however refusing post-acute rehab would benefit from max [facility] services.  Patient Saturations on Room Air at Rest 88%  Patient Saturations on Room during in room activity 83%  Patient Saturations on 2L O2 during in room activity 88-91%       If plan is discharge home, recommend the following:  A little help with walking and/or transfers;A little help with bathing/dressing/bathroom;Assistance with cooking/housework;Assist for transportation;Help with stairs or ramp for entrance   Equipment Recommendations  Tub/shower bench;Other (comment) (grab bars)    Recommendations for Other Services      Precautions / Restrictions Precautions Precautions: Fall Recall of Precautions/Restrictions: Intact Precaution/Restrictions Comments: watch  O2 Restrictions Weight Bearing Restrictions Per Provider Order: No       Mobility Bed Mobility               General bed mobility comments: pt standing up at recliner upon OT arrival    Transfers Overall transfer level: Needs assistance Equipment used: Rolling walker (2 wheels) Transfers: Sit to/from Stand Sit to Stand: Contact guard assist           General transfer comment: pt walked to bathtroom, transferred on/off commode and in/out of shower     Balance Overall balance assessment: Needs assistance Sitting-balance support: No upper extremity supported, Feet supported Sitting balance-Leahy Scale: Good     Standing balance support: Bilateral upper extremity supported, Reliant on assistive device for balance, During functional activity Standing balance-Leahy Scale: Poor                             ADL either performed or assessed with clinical judgement   ADL Overall ADL's : Needs assistance/impaired     Grooming: Wash/dry hands;Wash/dry face;Supervision/safety;Standing   Upper Body Bathing: Supervision/ safety;Sitting Upper Body Bathing Details (indicate cue type and reason): edcuated on compensatory techniques due to increased pain in L ribs Lower Body Bathing: Contact guard assist;Sitting/lateral leans   Upper Body Dressing : Supervision/safety;Sitting   Lower Body Dressing: Minimal assistance;Contact guard assist;Sit to/from stand;Cueing for safety   Toilet Transfer: Contact guard assist;Ambulation;Rolling walker (2 wheels);Regular Toilet   Toileting- Clothing Manipulation and Hygiene: Contact guard assist;Sit to/from stand;Cueing for safety   Tub/ Shower Transfer: Contact guard assist;Ambulation;Rolling walker (2 wheels);Grab bars;BSC/3in1;Cueing for safety  Functional mobility during ADLs: Contact guard assist;Rolling walker (2 wheels);Cueing for safety General ADL Comments: Pt not wearing O2 upon OT arrival to his room and standing up  at recliner. Educated pt using O2 during exertion and use of RW for safety, tub bench for home use (pt states, I'll pass on that) grab bars at toilet and in tub shower for safety and having a pulse ox to self monitor O2 SATs. reinforced with him importance of self-monitoring O2 levels    Extremity/Trunk Assessment Upper Extremity Assessment Upper Extremity Assessment: Overall WFL for tasks assessed   Lower Extremity Assessment Lower Extremity Assessment: Defer to PT evaluation        Vision Ability to See in Adequate Light: 0 Adequate Patient Visual Report: No change from baseline     Perception     Praxis     Communication Communication Communication: No apparent difficulties   Cognition Arousal: Alert Behavior During Therapy: WFL for tasks assessed/performed Cognition: No apparent impairments                               Following commands: Intact        Cueing   Cueing Techniques: Verbal cues  Exercises      Shoulder Instructions       General Comments      Pertinent Vitals/ Pain       Pain Assessment Pain Assessment: Faces Faces Pain Scale: Hurts even more Pain Location: back Pain Descriptors / Indicators: Discomfort, Grimacing, Sore Pain Intervention(s): Monitored during session, Repositioned  Home Living                                          Prior Functioning/Environment              Frequency  Min 2X/week        Progress Toward Goals  OT Goals(current goals can now be found in the care plan section)  Progress towards OT goals: Progressing toward goals     Plan      Co-evaluation                 AM-PAC OT "6 Clicks" Daily Activity     Outcome Measure   Help from another person eating meals?: None Help from another person taking care of personal grooming?: A Little Help from another person toileting, which includes using toliet, bedpan, or urinal?: A Little Help from another person  bathing (including washing, rinsing, drying)?: A Little Help from another person to put on and taking off regular upper body clothing?: A Little Help from another person to put on and taking off regular lower body clothing?: A Little 6 Click Score: 19    End of Session Equipment Utilized During Treatment: Gait belt;Rolling walker (2 wheels);Oxygen  OT Visit Diagnosis: Unsteadiness on feet (R26.81);Other abnormalities of gait and mobility (R26.89);Muscle weakness (generalized) (M62.81)   Activity Tolerance Patient tolerated treatment well   Patient Left in bed;with call bell/phone within reach;with bed alarm set   Nurse Communication Mobility status        Time: 9604-5409 OT Time Calculation (min): 29 min  Charges: OT General Charges $OT Visit: 1 Visit OT Treatments $Self Care/Home Management : 8-22 mins $Therapeutic Activity: 8-22 mins    Alfred Ann 02/17/2024, 11:12 AM

## 2024-02-17 NOTE — Progress Notes (Signed)
 PHARMACY - ANTICOAGULATION CONSULT NOTE  Pharmacy Consult for warfarin Indication: DVT  Allergies  Allergen Reactions   Codeine Hives and Other (See Comments)    Takes benadryl  to stop allergic reactions    Patient Measurements: Height: 6\' 1"  (185.4 cm) Weight: 91.2 kg (201 lb) IBW/kg (Calculated) : 79.9 HEPARIN  DW (KG): 91.2  Vital Signs: Temp: 98 F (36.7 C) (05/02 0817) Temp Source: Oral (05/02 0817) BP: 133/44 (05/02 0817) Pulse Rate: 92 (05/02 0817)  Labs: Recent Labs    02/15/24 0604 02/16/24 1914 02/17/24 0740  HGB 11.3* 11.4*  --   HCT 35.5* 35.7*  --   PLT 217 231  --   LABPROT 30.5* 24.4* 21.9*  INR 2.9* 2.2* 1.9*  CREATININE 2.06* 1.49*  --     Estimated Creatinine Clearance: 46.2 mL/min (A) (by C-G formula based on SCr of 1.49 mg/dL (H)).   Medical History: Past Medical History:  Diagnosis Date   AAA (abdominal aortic aneurysm) (HCC)    Blood dyscrasia    followed by Dr. Scherrie Curt, for clotting issue, has been on Coumadin  for about 20 yrs.     CAD (coronary artery disease)    Chronic kidney disease    COPD (chronic obstructive pulmonary disease) (HCC)    Diabetes mellitus without complication (HCC)    DJD (degenerative joint disease)    Dyslipidemia    Dyspnea    Gout    Malignant hypertension    Peripheral vascular disease (HCC)    Pneumonia 08/18/2013   pt. 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 Arlester Ladd and Dr. Sherene Dilling   Severe aortic stenosis    Venous thrombosis    Recurrent    Assessment: 78 y.o. male admitted for treatment of community-acquiren pneumonia.  He is treated with Warfarin PTA for recurrent DVT/PE. Pharmacy consulted for warfarin management during his admission.   PTA warfarin regimen - 2 mg on weekdays and 4 mg on  weekends.    He is currently treated with azithromycin  resulting in elevated INR on PTA regimen. Last dose of azithromycin  planned for 4/30.   INR 1.9 today  Goal of Therapy:  INR 2-3 Monitor platelets by anticoagulation protocol: Yes   Plan:  Warfarin 4 mg prior to discharge   Lennice Quivers, PharmD 02/17/2024 10:22 AM

## 2024-02-17 NOTE — TOC Transition Note (Signed)
 Transition of Care [facility]) - Discharge Note   Patient Details  Name: Tony Zhang MRN: [id number] Date of Birth: 1946/08/27  Transition of Care [facility]) CM/SW Contact:  Dane Dung, RN Phone Number: 02/17/2024, 10:49 AM   Clinical Narrative:    CM met with the patient at the bedside and patient is not interested in discharge to SNF.  Patient wants to go home and his transportation is waiting on the fron of the hospital to take him home.  Bedside nursing is aware.  Patient declined WC and and rolator at home and home oxygen.  [employer] was called to deliver portable oxygen tank to the hospital room to discharge home with.  Patient already has home oxygen at home through [employer].  Patient declined HH services.   No other TOC needs at this time.         Patient Goals and CMS Choice            Discharge Placement                       Discharge Plan and Services Additional resources added to the After Visit Summary for                                       Social Drivers of Health (SDOH) Interventions SDOH Screenings   Food Insecurity: No Food Insecurity (02/13/2024)  Housing: Low Risk  (02/13/2024)  Transportation Needs: No Transportation Needs (02/13/2024)  Utilities: Not At Risk (02/13/2024)  Alcohol Screen: Low Risk  (06/04/2022)  Financial Resource Strain: Low Risk  (06/04/2022)  Social Connections: Socially Isolated (02/13/2024)  Tobacco Use: Medium Risk (02/13/2024)     Readmission Risk Interventions    11/24/2023   12:05 PM  Readmission Risk Prevention Plan  Transportation Screening Complete  Home Care Screening Complete  Medication Review (RN CM) Complete

## 2024-02-17 NOTE — Progress Notes (Signed)
 Mobility Specialist: Progress Note   02/17/24 1145  Mobility  Activity Ambulated with assistance in hallway  Level of Assistance Standby assist, set-up cues, supervision of patient - no hands on  Assistive Device Front wheel walker  Distance Ambulated (ft) 100 ft  Activity Response Tolerated well  Mobility Referral Yes  Mobility visit 1 Mobility  Mobility Specialist Start Time (ACUTE ONLY) 0845  Mobility Specialist Stop Time (ACUTE ONLY) 0859  Mobility Specialist Time Calculation (min) (ACUTE ONLY) 14 min    Pre Mobility: SpO2 96% 2LO2 During Mobility: SpO2 89-92% 2LO2 Post Mobility: SpO2 93% 2LO2  Pt was agreeable to mobility session - received in bed. SV throughout. SpO2 WFL on 2LO2. C/o fatigue and SOB. Returned to room without fault. Left on EOB with all needs met, call bell in reach.   Deloria Fetch Mobility Specialist Please contact via SecureChat or Rehab office at [phone]

## 2024-02-18 LAB — CULTURE, BLOOD (ROUTINE X 2)
Culture: NO GROWTH
Culture: NO GROWTH
Special Requests: ADEQUATE

## 2024-03-29 NOTE — Progress Notes (Signed)
 Cardiology Office Note:  .   Date:  04/10/2024  ID:  Tony Zhang, DOB 06/19/1946, MRN [id number] PCP: Hunter Mickey Browner, PA-C  [facility] Providers Cardiologist:  Jerel Balding, MD    History of Present Illness: .   Tony Zhang is a 78 y.o. 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     After undergoing TAVR from a left subclavian approach he had a very protracted recovery since he developed gallstone pancreatitis.  This led to a long hospitalization, he eventually had lap chole 01/17/24. Losartan  and spironolactone  stopped due to AKI and pancreatitis.     Follow-up echo 12/07/2023 showed normal left ventricular systolic function and normal function of the TAVR prosthesis. E/e' ratio suggested mildly elevated left atrial filling pressures and he did have mild pulmonary hypertension.  Patient saw Dr. Balding 02/08/24 and BP up.losartan  restarted with plans to restart spironolactone .  Discharged 02/2024 with sepsis secondary to pneumonia. His PCP placed him on another antibiotic after for ongoing pneumonia.  Patient comes in for f/u. Having a lot of left sided rib pain-went to urgent care in [place] on Sat and had xrays and was told he had fluid and phlegm in his lungs/pneumonia and placed on an antibiotic,  9 tablets of hydrocodone , and antinflammatory. He thinks it's helped some but hurts to take a deep breath. Heart has been racing and  quivering for the past month-worse at night, doesn't feel during the day.     ROS:    Studies Reviewed: SABRA         Prior CV Studies:    CT angiogram of the aorta   1. No evidence of acute aortic syndrome. 2. 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 3. 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 Recommend annual imaging followup by CTA or MRA. This recommendation follows 2010 ACCF/AHA/AATS/ACR/ASA/SCA/SCAI/SIR/STS/SVM Guidelines for the Diagnosis and Management of Patients with Thoracic Aortic Disease. Circulation.2010; 121: Z733-z630. Aortic aneurysm NOS (ICD10-I71.9) 4.  Coronary and aortic Atherosclerosis (ICD10-I70.0).     Echocardiogram 12/07/2023    1. Left ventricular ejection fraction, by estimation, is 60 to 65%. The  left ventricle has normal function. The left ventricle has no regional  wall motion abnormalities. There is moderate left ventricular hypertrophy.  Left ventricular diastolic  parameters are consistent with Grade I diastolic dysfunction (impaired  relaxation).   2. Right ventricular systolic function is normal. The right ventricular  size is mildly enlarged. There is mildly elevated pulmonary artery  systolic pressure. The estimated right ventricular systolic pressure is  37.1 mmHg.   3. The mitral valve is degenerative. Trivial mitral valve regurgitation.  No evidence of mitral stenosis. Moderate mitral annular calcification.   4. The aortic valve has been repaired/replaced. Aortic valve  regurgitation is not visualized. No aortic stenosis is present. There is a  26 mm Sapien prosthetic (TAVR) valve present in the aortic position.  Procedure Date: 11/08/2023. Echo findings are  consistent with normal structure and function  of the aortic valve  prosthesis. Aortic valve area, by VTI measures 2.59 cm. Aortic valve mean  gradient measures 9.1  mmHg. Aortic valve Vmax measures 1.99 m/s. Aortic  valve acceleration time measures 53 msec.   5. The inferior vena cava is normal in size with greater than 50%  respiratory variability, suggesting right atrial pressure of 3 mmHg.     Cardiac catheterization 09/28/2023     Ost LAD to Mid LAD lesion is 50% stenosed.   Ost RCA to Prox RCA lesion is 80% stenosed.   Prox RCA lesion is 25% stenosed.   Mid RCA lesion is 70% stenosed.   1st Diag lesion is 50% stenosed.   3rd Mrg lesion is 75% stenosed.   Dist RCA lesion is 50% stenosed.   Non-stenotic Mid LAD lesion was previously treated.   Hemodynamic findings consistent with moderate pulmonary hypertension.   1.  Severe calcific stenosis of the RCA, unchanged from the previous cardiac catheterization procedure 2.  Continued patency of the stented segment in the mid LAD with moderate nonobstructive plaquing in the proximal vessel 3.  Patent left circumflex with no high-grade stenosis 4.  Patent left subclavian artery with mild calcified stenosis 5.  Calcified aortic valve leaflets with known severe aortic stenosis by noninvasive assessment 6.  Moderate pulmonary hypertension with mean PA pressure 39 mmHg, transpulmonary gradient 20 mmHg, PVR 4.26 Wood units   Recommendations: Continue TAVR evaluation.  Medical therapy for CAD.  Hydration with repeat labs in the morning, overnight observation.    Risk Assessment/Calculations:         Physical Exam:   VS:  BP (!) 168/83 Comment: Automatic  Pulse (!) 137   Ht 6' 1 (1.854 m)   Wt 204 lb 3.2 oz (92.6 kg)   SpO2 96%   BMI 26.94 kg/m    Wt Readings from Last 3 Encounters:  04/10/24 204 lb 3.2 oz (92.6 kg)  02/13/24 201 lb (91.2 kg)  02/08/24 212 lb (96.2 kg)    GEN: Obese, in no acute distress NECK: No JVD; No carotid bruits CARDIAC:  RRR, no murmurs, rubs, gallops RESPIRATORY:  Clear to auscultation without rales, wheezing or rhonchi  ABDOMEN: Soft, non-tender,  non-distended EXTREMITIES:  No edema; No deformity   ASSESSMENT AND PLAN: .    Sinus tachycardia/left rib pain/recurrent pneumonia-discussed with Dr. Cecile and recommend he go to the ED for evaluation and get an echo. Patient refuses to go to ED today as he doesn't think he's sick enough yet. Will check echo. Check CBC & CMET today, live Zio   S/P TAVR: Performed via alternative access..  Excellent TAVR prosthetic hemodynamics.  He is aware of the need for endocarditis prophylaxis with dental procedures.  CAD: Asymptomatic.  Plan to continue medical therapy.  He has a high-grade stenosis in the proximal right coronary artery that is difficult for percutaneous revascularization.  Since he is asymptomatic we will continue with medical therapy.  No longer on antiplatelet agents since he is fully anticoagulated with warfarin.  Had a drug-eluting stent placed for ACS in the LAD artery in late August 2023.,  Patent by the recent cath  PAD/chronic occlusion of the infrarenal abdominal aorta:   Longstanding history of occlusion of the distal abdominal aorta with reconstitution at the level of the common femoral arteries via inferior epigastric arteries.  Based on previous imaging studies does not have a whole lot of disease in the infrapopliteal distribution.    Aortic arch aneurysm/suprarenal AAA: His  ascending aortic aneurysm has minimally changed as most recently measured at 4.0 cm on CT before TAVR 11/15/2023 and a suprarenal abdominal aortic aneurysm 3.5 cm.  They both are appropriate for routine imaging follow-up on a yearly basis.  They do not require surgery at this time.  CHF:  Normal left ventricular systolic function.  Appears clinically euvolemic.  He is currently on Jardiance  and metoprolol  succinate as well as a very low-dose of furosemide .  losartan  50 mg daily.  HTN: His blood pressure is elevated today.  Losartan  restarted LOV. Check labs today  and plan to restart spironolactone  if lab  ok  HLP: Excellent response to Repatha , all lipid parameters are acceptable, most recent LDL cholesterol was 31.  Note that he has a severely elevated LP(a).  May be a candidate for inclisiran.   History of DVT/PE: INR has been subtherapeutic ever since his TAVR procedure in the episode of acute pancreatitis.  Incidentally noted to have a persistent left-sided inferior vena cava.  On chronic warfarin for over 20 years.  Was seeing Dr. Cloretta in the hematology clinic for hypercoagulable state.  INR is monitored in [place].  Avoid NSAIDs.  Prefer acetaminophen  or tramadol .    CKD3B: AKI during the episode of pancreatitis.  Renal Malinda matures back to baseline creatinine around 1.6, GFR around 40.   History of gout: Avoid thiazide diuretics.            Dispo: f/u after echo  Signed, Olivia Pavy, PA-C

## 2024-04-10 ENCOUNTER — Ambulatory Visit: Attending: Physician Assistant | Admitting: Physician Assistant

## 2024-04-10 ENCOUNTER — Encounter: Payer: Self-pay | Admitting: Physician Assistant

## 2024-04-10 ENCOUNTER — Other Ambulatory Visit

## 2024-04-10 VITALS — BP 168/83 | HR 137 | Ht 73.0 in | Wt 204.2 lb

## 2024-04-10 DIAGNOSIS — I495 Sick sinus syndrome: Secondary | ICD-10-CM

## 2024-04-10 DIAGNOSIS — I5032 Chronic diastolic (congestive) heart failure: Secondary | ICD-10-CM

## 2024-04-10 DIAGNOSIS — N183 Chronic kidney disease, stage 3 unspecified: Secondary | ICD-10-CM

## 2024-04-10 DIAGNOSIS — I739 Peripheral vascular disease, unspecified: Secondary | ICD-10-CM

## 2024-04-10 DIAGNOSIS — E7849 Other hyperlipidemia: Secondary | ICD-10-CM

## 2024-04-10 DIAGNOSIS — I10 Essential (primary) hypertension: Secondary | ICD-10-CM

## 2024-04-10 DIAGNOSIS — Z86718 Personal history of other venous thrombosis and embolism: Secondary | ICD-10-CM

## 2024-04-10 DIAGNOSIS — Z952 Presence of prosthetic heart valve: Secondary | ICD-10-CM

## 2024-04-10 DIAGNOSIS — I2510 Atherosclerotic heart disease of native coronary artery without angina pectoris: Secondary | ICD-10-CM

## 2024-04-10 DIAGNOSIS — I4719 Other supraventricular tachycardia: Secondary | ICD-10-CM

## 2024-04-10 DIAGNOSIS — Z86711 Personal history of pulmonary embolism: Secondary | ICD-10-CM

## 2024-04-10 DIAGNOSIS — I7121 Aneurysm of the ascending aorta, without rupture: Secondary | ICD-10-CM

## 2024-04-10 LAB — CBC

## 2024-04-10 NOTE — Patient Instructions (Addendum)
 Medication Instructions:  Your physician recommends that you continue on your current medications as directed. Please refer to the Current Medication list given to you today.  *If you need a refill on your cardiac medications before your next appointment, please call your pharmacy*  Lab Work: TODAY: CMET, CBC If you have labs (blood work) drawn today and your tests are completely normal, you will receive your results only by: MyChart Message (if you have MyChart) OR A paper copy in the mail If you have any lab test that is abnormal or we need to change your treatment, we will call you to review the results.  Testing/Procedures: Your physician has requested that you have an echocardiogram. Echocardiography is a painless test that uses sound waves to create images of your heart. It provides your doctor with information about the size and shape of your heart and how well your heart's chambers and valves are working. This procedure takes approximately one hour. There are no restrictions for this procedure. Please do NOT wear cologne, perfume, aftershave, or lotions (deodorant is allowed). Please arrive 15 minutes prior to your appointment time.  Please note: We ask at that you not bring children with you during ultrasound (echo/ vascular) testing. Due to room size and safety concerns, children are not allowed in the ultrasound rooms during exams. Our front office staff cannot provide observation of children in our lobby area while testing is being conducted. An adult accompanying a patient to their appointment will only be allowed in the ultrasound room at the discretion of the ultrasound technician under special circumstances. We apologize for any inconvenience.   ZIO AT Long term monitor-Live Telemetry  Your physician has requested you wear a ZIO patch monitor for 14 days.  This is a single patch monitor. [employer] supplies one patch monitor per enrollment. Additional  stickers are not available.   Please do not apply patch if you will be having a Nuclear Stress Test, Echocardiogram, Cardiac CT, MRI,  or Chest Xray during the period you would be wearing the monitor. The patch cannot be worn during  these tests. You cannot remove and re-apply the ZIO AT patch monitor.  Your ZIO patch monitor will be mailed 3 day USPS to your address on file. It may take 3-5 days to  receive your monitor after you have been enrolled.  Once you have received your monitor, please review the enclosed instructions. Your monitor has  already been registered assigning a specific monitor serial # to you.   Billing and Patient Assistance Program information  Meredeth has been supplied with any insurance information on record for billing. [employer] offers a sliding scale Patient Assistance Program for patients without insurance, or whose  insurance does not completely cover the cost of the ZIO patch monitor. You must apply for the  Patient Assistance Program to qualify for the discounted rate. To apply, call [employer] at [phone],  select option 4, select option 2 , ask to apply for the Patient Assistance Program, (you can request an  interpreter if needed). [employer] will ask your household income and how many people are in your  household. [employer] will quote your out-of-pocket cost based on this information. They will also be able  to set up a 12 month interest free payment plan if needed.  Starting the Gateway  In your kit there is a Audiological scientist box the size of a cellphone. This is Buyer, retail. It transmits all your  recorded data to [facility]. This box must always  stay within 10 feet of you. Open the box and push the *  button. There will be a light that blinks orange and then green a few times. When the light stops  blinking, the Gateway is connected to the ZIO patch. Call [employer] at [phone] to confirm your monitor is transmitting.  Returning your monitor  Remove your patch and place it inside the  Gateway. In the lower half of the Gateway there is a white  bag with prepaid postage on it. Place Gateway in bag and seal. Mail package back to [place] as soon as  possible. Your physician should have your final report approximately 7 days after you have mailed back  your monitor. Call [facility] Customer Care at [phone] if you have questions regarding your ZIO AT  patch monitor. Call them immediately if you see an orange light blinking on your monitor.  If your monitor falls off in less than 4 days, contact our Monitor department at [phone]. If your  monitor becomes loose or falls off after 4 days call [employer] at [phone] for suggestions on  securing your monitor   Follow-Up: At [facility], you and your health needs are our priority.  As part of our continuing mission to provide you with exceptional heart care, our providers are all part of one team.  This team includes your primary Cardiologist (physician) and Advanced Practice Providers or APPs (Physician Assistants and Nurse Practitioners) who all work together to provide you with the care you need, when you need it.  Your next appointment:   AFTER ECHO  Provider:   Jerel Balding, MD   We recommend signing up for the patient portal called MyChart.  Sign up information is provided on this After Visit Summary.  MyChart is used to connect with patients for Virtual Visits (Telemedicine).  Patients are able to view lab/test results, encounter notes, upcoming appointments, etc.  Non-urgent messages can be sent to your provider as well.   To learn more about what you can do with MyChart, go to [email or web address].

## 2024-04-10 NOTE — Progress Notes (Unsigned)
 Applied a 14 day Zio AT monitor to patient in the office  Croitoru to read

## 2024-04-11 ENCOUNTER — Inpatient Hospital Stay (HOSPITAL_COMMUNITY)
Admission: EM | Admit: 2024-04-11 | Discharge: 2024-04-16 | DRG: 439 | Disposition: A | Discharge status: Home / Self Care | Attending: Internal Medicine | Admitting: Internal Medicine

## 2024-04-11 ENCOUNTER — Ambulatory Visit: Payer: Self-pay | Admitting: Physician Assistant

## 2024-04-11 ENCOUNTER — Observation Stay (HOSPITAL_COMMUNITY)

## 2024-04-11 ENCOUNTER — Emergency Department (HOSPITAL_COMMUNITY)

## 2024-04-11 ENCOUNTER — Other Ambulatory Visit: Payer: Self-pay

## 2024-04-11 DIAGNOSIS — Z86711 Personal history of pulmonary embolism: Secondary | ICD-10-CM

## 2024-04-11 DIAGNOSIS — Z8249 Family history of ischemic heart disease and other diseases of the circulatory system: Secondary | ICD-10-CM

## 2024-04-11 DIAGNOSIS — I252 Old myocardial infarction: Secondary | ICD-10-CM

## 2024-04-11 DIAGNOSIS — Z87891 Personal history of nicotine dependence: Secondary | ICD-10-CM

## 2024-04-11 DIAGNOSIS — I714 Abdominal aortic aneurysm, without rupture, unspecified: Secondary | ICD-10-CM | POA: Diagnosis present

## 2024-04-11 DIAGNOSIS — I471 Supraventricular tachycardia, unspecified: Secondary | ICD-10-CM | POA: Diagnosis present

## 2024-04-11 DIAGNOSIS — I130 Hypertensive heart and chronic kidney disease with heart failure and stage 1 through stage 4 chronic kidney disease, or unspecified chronic kidney disease: Secondary | ICD-10-CM | POA: Diagnosis present

## 2024-04-11 DIAGNOSIS — N1831 Chronic kidney disease, stage 3a: Secondary | ICD-10-CM | POA: Diagnosis present

## 2024-04-11 DIAGNOSIS — R0602 Shortness of breath: Secondary | ICD-10-CM | POA: Diagnosis not present

## 2024-04-11 DIAGNOSIS — Z79899 Other long term (current) drug therapy: Secondary | ICD-10-CM

## 2024-04-11 DIAGNOSIS — Z8701 Personal history of pneumonia (recurrent): Secondary | ICD-10-CM

## 2024-04-11 DIAGNOSIS — Z1152 Encounter for screening for COVID-19: Secondary | ICD-10-CM

## 2024-04-11 DIAGNOSIS — R0902 Hypoxemia: Secondary | ICD-10-CM | POA: Diagnosis present

## 2024-04-11 DIAGNOSIS — Z885 Allergy status to narcotic agent status: Secondary | ICD-10-CM

## 2024-04-11 DIAGNOSIS — R791 Abnormal coagulation profile: Secondary | ICD-10-CM | POA: Diagnosis present

## 2024-04-11 DIAGNOSIS — M109 Gout, unspecified: Secondary | ICD-10-CM | POA: Diagnosis present

## 2024-04-11 DIAGNOSIS — Z952 Presence of prosthetic heart valve: Secondary | ICD-10-CM | POA: Diagnosis present

## 2024-04-11 DIAGNOSIS — E538 Deficiency of other specified B group vitamins: Secondary | ICD-10-CM | POA: Diagnosis present

## 2024-04-11 DIAGNOSIS — I10 Essential (primary) hypertension: Secondary | ICD-10-CM | POA: Diagnosis present

## 2024-04-11 DIAGNOSIS — E785 Hyperlipidemia, unspecified: Secondary | ICD-10-CM | POA: Diagnosis present

## 2024-04-11 DIAGNOSIS — N400 Enlarged prostate without lower urinary tract symptoms: Secondary | ICD-10-CM | POA: Diagnosis present

## 2024-04-11 DIAGNOSIS — Z955 Presence of coronary angioplasty implant and graft: Secondary | ICD-10-CM

## 2024-04-11 DIAGNOSIS — G8929 Other chronic pain: Secondary | ICD-10-CM | POA: Diagnosis present

## 2024-04-11 DIAGNOSIS — Z7984 Long term (current) use of oral hypoglycemic drugs: Secondary | ICD-10-CM

## 2024-04-11 DIAGNOSIS — E1122 Type 2 diabetes mellitus with diabetic chronic kidney disease: Secondary | ICD-10-CM | POA: Diagnosis present

## 2024-04-11 DIAGNOSIS — Z7951 Long term (current) use of inhaled steroids: Secondary | ICD-10-CM

## 2024-04-11 DIAGNOSIS — K859 Acute pancreatitis without necrosis or infection, unspecified: Principal | ICD-10-CM

## 2024-04-11 DIAGNOSIS — D72829 Elevated white blood cell count, unspecified: Secondary | ICD-10-CM | POA: Diagnosis present

## 2024-04-11 DIAGNOSIS — K858 Other acute pancreatitis without necrosis or infection: Secondary | ICD-10-CM | POA: Diagnosis not present

## 2024-04-11 DIAGNOSIS — K861 Other chronic pancreatitis: Secondary | ICD-10-CM | POA: Diagnosis present

## 2024-04-11 DIAGNOSIS — I4891 Unspecified atrial fibrillation: Secondary | ICD-10-CM | POA: Diagnosis present

## 2024-04-11 DIAGNOSIS — Z7901 Long term (current) use of anticoagulants: Secondary | ICD-10-CM

## 2024-04-11 DIAGNOSIS — I7409 Other arterial embolism and thrombosis of abdominal aorta: Secondary | ICD-10-CM | POA: Diagnosis present

## 2024-04-11 DIAGNOSIS — J189 Pneumonia, unspecified organism: Secondary | ICD-10-CM

## 2024-04-11 DIAGNOSIS — E1151 Type 2 diabetes mellitus with diabetic peripheral angiopathy without gangrene: Secondary | ICD-10-CM | POA: Diagnosis present

## 2024-04-11 DIAGNOSIS — K449 Diaphragmatic hernia without obstruction or gangrene: Secondary | ICD-10-CM | POA: Insufficient documentation

## 2024-04-11 DIAGNOSIS — I701 Atherosclerosis of renal artery: Secondary | ICD-10-CM | POA: Diagnosis present

## 2024-04-11 DIAGNOSIS — I5032 Chronic diastolic (congestive) heart failure: Secondary | ICD-10-CM | POA: Diagnosis present

## 2024-04-11 DIAGNOSIS — I700 Atherosclerosis of aorta: Secondary | ICD-10-CM | POA: Diagnosis present

## 2024-04-11 DIAGNOSIS — Z9049 Acquired absence of other specified parts of digestive tract: Secondary | ICD-10-CM

## 2024-04-11 DIAGNOSIS — Z7902 Long term (current) use of antithrombotics/antiplatelets: Secondary | ICD-10-CM

## 2024-04-11 DIAGNOSIS — Z8616 Personal history of COVID-19: Secondary | ICD-10-CM

## 2024-04-11 DIAGNOSIS — Z8679 Personal history of other diseases of the circulatory system: Secondary | ICD-10-CM

## 2024-04-11 DIAGNOSIS — I739 Peripheral vascular disease, unspecified: Secondary | ICD-10-CM | POA: Diagnosis present

## 2024-04-11 DIAGNOSIS — J439 Emphysema, unspecified: Secondary | ICD-10-CM | POA: Insufficient documentation

## 2024-04-11 DIAGNOSIS — N179 Acute kidney failure, unspecified: Secondary | ICD-10-CM | POA: Diagnosis present

## 2024-04-11 DIAGNOSIS — I2510 Atherosclerotic heart disease of native coronary artery without angina pectoris: Secondary | ICD-10-CM | POA: Diagnosis present

## 2024-04-11 DIAGNOSIS — Z86718 Personal history of other venous thrombosis and embolism: Secondary | ICD-10-CM

## 2024-04-11 DIAGNOSIS — Z800 Family history of malignant neoplasm of digestive organs: Secondary | ICD-10-CM

## 2024-04-11 LAB — URINALYSIS, W/ REFLEX TO CULTURE (INFECTION SUSPECTED)
Bilirubin Urine: NEGATIVE
Glucose, UA: >=500 mg/dL — AB
Hgb urine dipstick: NEGATIVE
Ketones, ur: NEGATIVE mg/dL
Leukocytes,Ua: NEGATIVE
Nitrite: NEGATIVE
Protein, ur: NEGATIVE mg/dL
Specific Gravity, Urine: 1.016 (ref 1.005–1.030)
pH: 5 (ref 5.0–8.0)

## 2024-04-11 LAB — CBC
HCT: 51.6 % (ref 39.0–52.0)
Hematocrit: 56.6 % — ABNORMAL HIGH (ref 37.5–51.0)
Hemoglobin: 18.9 g/dL — ABNORMAL HIGH (ref 13.0–17.7)
Hemoglobin: 16.1 g/dL (ref 13.0–17.0)
MCH: 30.6 pg (ref 26.6–33.0)
MCH: 29.4 pg (ref 26.0–34.0)
MCHC: 33.4 g/dL (ref 31.5–35.7)
MCHC: 31.2 g/dL (ref 30.0–36.0)
MCV: 92 fL (ref 79–97)
MCV: 94.2 fL (ref 80.0–100.0)
Platelets: 445 10*3/uL (ref 150–450)
Platelets: 295 10*3/uL (ref 150–400)
RBC: 6.17 x10E6/uL — ABNORMAL HIGH (ref 4.14–5.80)
RBC: 5.48 MIL/uL (ref 4.22–5.81)
RDW: 15.4 % (ref 11.6–15.4)
RDW: 15.9 % — ABNORMAL HIGH (ref 11.5–15.5)
WBC: 20.7 10*3/uL (ref 3.4–10.8)
WBC: 14.3 10*3/uL — ABNORMAL HIGH (ref 4.0–10.5)
nRBC: 0 % (ref 0.0–0.2)

## 2024-04-11 LAB — COMPREHENSIVE METABOLIC PANEL WITH GFR
ALT: 13 IU/L (ref 0–44)
ALT: 14 U/L (ref 0–44)
AST: 22 IU/L (ref 0–40)
AST: 25 U/L (ref 15–41)
Albumin: 4.5 g/dL (ref 3.8–4.8)
Albumin: 3.1 g/dL — ABNORMAL LOW (ref 3.5–5.0)
Alkaline Phosphatase: 72 IU/L (ref 44–121)
Alkaline Phosphatase: 43 U/L (ref 38–126)
Anion gap: 15 (ref 5–15)
BUN/Creatinine Ratio: 20 (ref 10–24)
BUN: 25 mg/dL (ref 8–27)
BUN: 29 mg/dL — ABNORMAL HIGH (ref 8–23)
Bilirubin Total: 0.6 mg/dL (ref 0.0–1.2)
CO2: 16 mmol/L — ABNORMAL LOW (ref 20–29)
CO2: 14 mmol/L — ABNORMAL LOW (ref 22–32)
Calcium: 10.1 mg/dL (ref 8.6–10.2)
Calcium: 8.7 mg/dL — ABNORMAL LOW (ref 8.9–10.3)
Chloride: 98 mmol/L (ref 96–106)
Chloride: 107 mmol/L (ref 98–111)
Creatinine, Ser: 1.25 mg/dL (ref 0.76–1.27)
Creatinine, Ser: 1.4 mg/dL — ABNORMAL HIGH (ref 0.61–1.24)
GFR, Estimated: 51 mL/min — ABNORMAL LOW (ref >60)
Globulin, Total: 3.4 g/dL (ref 1.5–4.5)
Glucose, Bld: 265 mg/dL — ABNORMAL HIGH (ref 70–99)
Glucose: 178 mg/dL — ABNORMAL HIGH (ref 70–99)
Potassium: 4.1 mmol/L (ref 3.5–5.2)
Potassium: 3.9 mmol/L (ref 3.5–5.1)
Sodium: 137 mmol/L (ref 134–144)
Sodium: 136 mmol/L (ref 135–145)
Total Bilirubin: 0.8 mg/dL (ref 0.0–1.2)
Total Protein: 7.9 g/dL (ref 6.0–8.5)
Total Protein: 6.6 g/dL (ref 6.5–8.1)
eGFR: 59 mL/min/{1.73_m2} — ABNORMAL LOW (ref >59)

## 2024-04-11 LAB — PROTIME-INR
INR: 4.1 (ref 0.8–1.2)
Prothrombin Time: 41.2 s — ABNORMAL HIGH (ref 11.4–15.2)

## 2024-04-11 LAB — RESP PANEL BY RT-PCR (RSV, FLU A&B, COVID)  RVPGX2
Influenza A by PCR: NEGATIVE
Influenza B by PCR: NEGATIVE
Resp Syncytial Virus by PCR: NEGATIVE
SARS Coronavirus 2 by RT PCR: NEGATIVE

## 2024-04-11 LAB — I-STAT CG4 LACTIC ACID, ED: Lactic Acid, Venous: 1.9 mmol/L (ref 0.5–1.9)

## 2024-04-11 LAB — LIPASE, BLOOD: Lipase: 293 U/L — ABNORMAL HIGH (ref 11–51)

## 2024-04-11 MED ORDER — LACTATED RINGERS IV SOLN: INTRAVENOUS | Status: DC

## 2024-04-11 MED ORDER — FENTANYL CITRATE PF 50 MCG/ML IJ SOSY
50.0000 ug | PREFILLED_SYRINGE | Freq: Once | INTRAMUSCULAR | Status: AC
  Administered 2024-04-11: 50 ug via INTRAVENOUS
  Filled 2024-04-11: qty 1

## 2024-04-11 MED ORDER — ACETAMINOPHEN 500 MG PO TABS
500.0000 mg | ORAL_TABLET | Freq: Four times a day (QID) | ORAL | Status: DC | PRN
  Administered 2024-04-11 – 2024-04-12 (×2): 500 mg via ORAL
  Filled 2024-04-11 (×2): qty 1

## 2024-04-11 MED ORDER — VANCOMYCIN HCL 2000 MG/400ML IV SOLN
2000.0000 mg | Freq: Once | INTRAVENOUS | Status: AC
  Administered 2024-04-11: 2000 mg via INTRAVENOUS
  Filled 2024-04-11: qty 400

## 2024-04-11 MED ORDER — EMPAGLIFLOZIN 10 MG PO TABS
10.0000 mg | ORAL_TABLET | Freq: Every day | ORAL | Status: DC
  Administered 2024-04-11 – 2024-04-15 (×5): 10 mg via ORAL
  Filled 2024-04-11 (×5): qty 1

## 2024-04-11 MED ORDER — HYDROMORPHONE HCL 1 MG/ML IJ SOLN
1.0000 mg | INTRAMUSCULAR | Status: DC | PRN
  Administered 2024-04-11 – 2024-04-12 (×4): 1 mg via INTRAVENOUS
  Filled 2024-04-11 (×4): qty 1

## 2024-04-11 MED ORDER — METRONIDAZOLE 500 MG/100ML IV SOLN: 500.0000 mg | Freq: Once | INTRAVENOUS | Status: DC

## 2024-04-11 MED ORDER — FLUTICASONE FUROATE-VILANTEROL 200-25 MCG/ACT IN AEPB
1.0000 | INHALATION_SPRAY | Freq: Every day | RESPIRATORY_TRACT | Status: DC
  Administered 2024-04-14 – 2024-04-16 (×2): 1 via RESPIRATORY_TRACT
  Filled 2024-04-11: qty 28

## 2024-04-11 MED ORDER — OXYCODONE-ACETAMINOPHEN 5-325 MG PO TABS
1.0000 | ORAL_TABLET | Freq: Once | ORAL | Status: AC
  Administered 2024-04-11: 1 via ORAL
  Filled 2024-04-11: qty 1

## 2024-04-11 MED ORDER — LACTATED RINGERS IV SOLN: INTRAVENOUS | Status: AC

## 2024-04-11 MED ORDER — METOPROLOL SUCCINATE ER 100 MG PO TB24
100.0000 mg | ORAL_TABLET | Freq: Every day | ORAL | Status: DC
  Administered 2024-04-12 – 2024-04-16 (×4): 100 mg via ORAL
  Filled 2024-04-11 (×4): qty 1

## 2024-04-11 MED ORDER — LACTATED RINGERS IV BOLUS (SEPSIS)
1000.0000 mL | Freq: Once | INTRAVENOUS | Status: AC
  Administered 2024-04-11: 1000 mL via INTRAVENOUS

## 2024-04-11 MED ORDER — LACTATED RINGERS IV BOLUS
1000.0000 mL | Freq: Once | INTRAVENOUS | Status: AC
  Administered 2024-04-11: 1000 mL via INTRAVENOUS

## 2024-04-11 MED ORDER — TAMSULOSIN HCL 0.4 MG PO CAPS
0.4000 mg | ORAL_CAPSULE | Freq: Every day | ORAL | Status: DC
  Administered 2024-04-11 – 2024-04-16 (×6): 0.4 mg via ORAL
  Filled 2024-04-11 (×6): qty 1

## 2024-04-11 MED ORDER — ATORVASTATIN CALCIUM 10 MG PO TABS
20.0000 mg | ORAL_TABLET | Freq: Every day | ORAL | Status: DC
  Administered 2024-04-12 – 2024-04-16 (×5): 20 mg via ORAL
  Filled 2024-04-11 (×5): qty 2

## 2024-04-11 MED ORDER — ONDANSETRON HCL 4 MG/2ML IJ SOLN
4.0000 mg | Freq: Three times a day (TID) | INTRAMUSCULAR | Status: DC | PRN
  Administered 2024-04-12: 4 mg via INTRAVENOUS
  Filled 2024-04-11: qty 2

## 2024-04-11 MED ORDER — SENNOSIDES-DOCUSATE SODIUM 8.6-50 MG PO TABS: 1.0000 | ORAL_TABLET | Freq: Every evening | ORAL | Status: DC | PRN

## 2024-04-11 MED ORDER — FENOFIBRIC ACID 135 MG PO CPDR: 1.0000 | DELAYED_RELEASE_CAPSULE | Freq: Every day | ORAL | Status: DC

## 2024-04-11 MED ORDER — ALBUTEROL SULFATE (2.5 MG/3ML) 0.083% IN NEBU: 3.0000 mL | INHALATION_SOLUTION | RESPIRATORY_TRACT | Status: DC | PRN

## 2024-04-11 MED ORDER — IOHEXOL 350 MG/ML SOLN
75.0000 mL | Freq: Once | INTRAVENOUS | Status: AC | PRN
  Administered 2024-04-11: 75 mL via INTRAVENOUS

## 2024-04-11 MED ORDER — HYDROMORPHONE HCL 1 MG/ML IJ SOLN
1.0000 mg | Freq: Once | INTRAMUSCULAR | Status: AC
  Administered 2024-04-11: 1 mg via INTRAVENOUS
  Filled 2024-04-11: qty 1

## 2024-04-11 MED ORDER — SODIUM CHLORIDE 0.9 % IV SOLN: 2.0000 g | Freq: Once | INTRAVENOUS | Status: DC

## 2024-04-11 MED ORDER — FENOFIBRATE 160 MG PO TABS
160.0000 mg | ORAL_TABLET | Freq: Every day | ORAL | Status: DC
  Administered 2024-04-11 – 2024-04-16 (×6): 160 mg via ORAL
  Filled 2024-04-11 (×7): qty 1

## 2024-04-11 MED ORDER — PIPERACILLIN-TAZOBACTAM 3.375 G IVPB 30 MIN
3.3750 g | Freq: Once | INTRAVENOUS | Status: AC
  Administered 2024-04-11: 3.375 g via INTRAVENOUS
  Filled 2024-04-11: qty 50

## 2024-04-11 MED ORDER — VANCOMYCIN HCL IN DEXTROSE 1-5 GM/200ML-% IV SOLN: 1000.0000 mg | Freq: Once | INTRAVENOUS | Status: DC

## 2024-04-11 MED ORDER — SODIUM CHLORIDE 0.9% FLUSH
3.0000 mL | Freq: Two times a day (BID) | INTRAVENOUS | Status: DC
  Administered 2024-04-11 – 2024-04-16 (×11): 3 mL via INTRAVENOUS

## 2024-04-11 NOTE — ED Triage Notes (Signed)
 BIB [facility] from home for [facility], Patient stated that PCP had sent patient to ED for evaluation for abnormal labs. Hx Pneumonia 3 weeks ago. Had rounds of antibiotics

## 2024-04-11 NOTE — Hospital Course (Addendum)
 Tony Zhang is a 78 y.o. male with a past medical history of AAA, CAD (NSTEMI and s/p DES-LAD 05/2022), HFpEF, PAD with chronic occlusion to the distal abdominal aorta, s/p TAVR (10/2023), pancreatitis s/p cholecystectomy (01/2024), DVT/PE (on Warfarin), DM, HTN, HLD, and severe emphysema who presented per Cardiology recommendations for shortness of breath and leukocytosis, subsequently admitted for acute interstitial pancreatitis.   Below is a description of the patient's hospitalization by problem.   Acute interstitial pancreatitis  Leukocytosis  Hx gallstone pancreatitis s/p cholecystectomy (01/2024 Presented with several weeks of progressively worsening abdominal pain. CT A/P with evidence of acute interstitial pancreatitis. Lipase elevated to 293. No risk factors for current episode including alcohol use, hypertriglyceridemia, recent ERCP or contributing medications. MRCP was obtained due to concern for choledocholithiasis given post cholecystectomy status. MRCP did not show acute pancreatic fluid collection, biliary dilitation, or choledocholithiasis. Findings appeared consistent with chronic sequelae of acute pancreatitis from 10/2023, thus suspect this presentation is acute on chronic pancreatitis. His leukocytosis resolved and his blood cultures remained negative to date. Suspect initial leukocytosis may have also been in the setting of prednisone  prescribed my urgent care a few days before presentation. He remained hemodynamically stable and was able to tolerate a regular diet without nausea or vomiting at the time of discharge.   Dyspnea  Recent pneumonia  Hx severe emphysema  Initial concern for recurrent pneumonia given shortness of breath and leukocytosis, thus broad spectrum antibiotics were initiated in the ED. CT chest showed subpleural scarring consistent with emphysema but did not show any pleural effusion or consolidation. Patient remained without clinical signs of pneumonia and was  quickly weaned to RA, thus antibiotics were discontinued. He remained afebrile. His home Symbicort  and albuterol  were continued.SABRA OT/PT recommended ***.   Hx of DVT/PE  Long-term anticoagulation, supratherapeutic  On chronic Warfarin for > 20 years. INR supratherapeutic at 4.4 on admission, after which Warfarin was held with improvement in INR. Suspect elevated INR was due to questionable medication management vs recent antibiotics for pneumonia. He was switched to Eliquis/Xarelto *** at discharge.   CAD  Hx NSTEMI  S/p DES-LAD (05/2022)  High-grade stenosis of the RCA  Patient remained asymptomatic without chest pain. Home atorvastatin  was continued.   HFpEF  Aortic stenosis s/p TAVR (10/2023) Echo 6/26 demonstrated LVEF 60-65%, unchanged from prior echo in 11/2023. No wall motion abnormalities or valvular disease. Clinically remained euvolemic, thus Lasix  was held. His home Jardiance  was continued.   Hypertension Blood pressures initially elevated likely 2/2 significant abdominal pain. His home metoprolol  succinate was continued, and spironolactone  + losartan  were restarted on 6/26. His blood pressures improved with resumption of home medications and improvement in pain.  Hyperlipidemia  Home febofibric acid was continued.   PAD  Chronic occlusion of the infrarenal abdominal aorta  Longstanding history of occlusion of the distal abdominal aorta with reconstitution at the level of the common femoral arteries via inferior epigastric arteries. Home Cilostazol  was held but his gabapentin  was continued throughout admission.   Polypharmacy  At risk medication management Of note, patient reports knowing his medications based on dosages and not names. In depth medication reconciliation was preformed prior to discharge.    -----

## 2024-04-11 NOTE — Sepsis Progress Note (Signed)
 Code Sepsis protocol being monitored by eLink.

## 2024-04-11 NOTE — ED Notes (Signed)
 Nt called CCMD@9 :41am

## 2024-04-11 NOTE — ED Notes (Signed)
 Called CCMD to add to cardiac monitoring

## 2024-04-11 NOTE — ED Notes (Signed)
 Went to CT

## 2024-04-11 NOTE — ED Notes (Signed)
 Nt caledd CCMD@9 :41am

## 2024-04-11 NOTE — ED Provider Notes (Signed)
 [place] EMERGENCY DEPARTMENT AT [facility] Provider Note  CSN: [id number] Arrival date & time: 04/11/24 9086  Chief Complaint(s) Shortness of Breath  HPI Tony Zhang is a 78 y.o. male who is here today for chest pain, shortness of breath, intermittent chills and cough.  Patient followed up with his PCP yesterday, was concerned about recurrent pneumonia, and advised him to come to the ED but the patient preferred not to go at that time.  Recently completed course of antibiotics.   Past Medical History Past Medical History:  Diagnosis Date   AAA (abdominal aortic aneurysm) (HCC)    Blood dyscrasia    followed by Dr. Cloretta, for clotting issue, has been on Coumadin  for about 20 yrs.     CAD (coronary artery disease)    Chronic kidney disease    COPD (chronic obstructive pulmonary disease) (HCC)    Diabetes mellitus without complication (HCC)    DJD (degenerative joint disease)    Dyslipidemia    Dyspnea    Gout    Malignant hypertension    Peripheral vascular disease (HCC)    Pneumonia 08/18/2013   pt. 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 Wonda and Dr. Lucas   Severe aortic stenosis    Venous thrombosis    Recurrent   Patient Active Problem List   Diagnosis Date Noted   Acute hypoxemic respiratory failure (HCC) 02/13/2024   Leukocytosis 01/19/2024   History of COVID-19 01/19/2024   History of DVT (deep vein thrombosis) 01/19/2024   Chronic anticoagulation 01/18/2024   S/P laparoscopic cholecystectomy 01/17/2024   Primary hypercoagulable state (HCC) 01/17/2024   History of transcatheter aortic valve replacement (TAVR) 01/17/2024   Chronic heart failure with preserved ejection fraction (HCC) 01/17/2024   Hematoma 11/19/2023   Acute biliary  pancreatitis 11/12/2023   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   CAD (coronary artery disease) 09/29/2023   HTN (hypertension) 09/29/2023   Aortic stenosis, severe 09/28/2023   Non-ST elevation (NSTEMI) myocardial infarction [facility])    Chronic diastolic CHF (congestive heart failure), NYHA class 2 (HCC) 06/02/2022   Enlarged prostate 06/02/2022   Mediastinal lymphadenopathy 06/02/2022   GIB (gastrointestinal bleeding) 04/08/2015   Acute kidney injury superimposed on chronic kidney disease (HCC) 04/08/2015   Loculated pleural effusion    Empyema, right (HCC) 11/25/2014   PAD (peripheral artery disease) (HCC) 10/10/2014   Anticoagulated on Coumadin  10/10/2014   Microcytic anemia 09/18/2013   CAP (community acquired pneumonia) 09/09/2013   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 Authorizing Provider  acetaminophen  (TYLENOL ) 500 MG tablet Take 500 mg by mouth See admin instructions. Take 500mg  (1 tablet) by mouth three to four times a day.    [provider]  albuterol  (VENTOLIN  HFA) 108 (90 Base) MCG/ACT inhaler Inhale 2 puffs into the lungs every 4 (four) hours as needed for wheezing or shortness of breath.  [provider]  allopurinol  (ZYLOPRIM ) 300 MG tablet Take 300 mg by mouth at bedtime.    [provider]  amoxicillin -clavulanate (AUGMENTIN ) 875-125 MG tablet Take 1 tablet by mouth 2 (two) times daily. 03/09/24   [provider]  atorvastatin  (LIPITOR) 20 MG tablet Take 1 tablet (20 mg total)  by mouth daily. 10/05/23   Croitoru, Mihai, MD  benzonatate  (TESSALON  PERLES) 100 MG capsule Take 1 capsule (100 mg total) by mouth 3 (three) times daily as needed for cough. 01/21/24 01/20/25  Patel, Pranav M, MD  budesonide -formoterol  (SYMBICORT ) 160-4.5 MCG/ACT inhaler Inhale 2 puffs into the lungs in the morning and at bedtime. 02/28/23   Gonfa, Taye T, MD  Choline Fenofibrate  (FENOFIBRIC ACID ) 135 MG CPDR TAKE 1 TABLET BY MOUTH DAILY 09/26/23   Wonda Sharper, MD  cilostazol  (PLETAL ) 100 MG tablet Take 100 mg by mouth daily.    [provider]  Cyanocobalamin  (VITAMIN DEFICIENCY SYSTEM-B12) 1000 MCG/ML KIT Inject 1,000 mcg into the skin every 30 (thirty) days.    [provider]  empagliflozin  (JARDIANCE ) 10 MG TABS tablet Take 1 tablet (10 mg total) by mouth daily. 08/25/23   Wonda Sharper, MD  furosemide  (LASIX ) 20 MG tablet Take 1 tablet (20 mg total) by mouth daily as needed. 02/08/24 05/08/24  Croitoru, Mihai, MD  gabapentin  (NEURONTIN ) 600 MG tablet Take 600 mg by mouth 3 (three) times daily. 12/02/23 12/01/24  [provider]  guaiFENesin  (MUCINEX ) 600 MG 12 hr tablet Take 1 tablet (600 mg total) by mouth 2 (two) times daily. 01/21/24 01/20/25  Patel, Pranav M, MD  HYDROcodone -acetaminophen  (NORCO/VICODIN) 5-325 MG tablet Take 0.5-1 tablets by mouth every 6 (six) hours as needed. 04/07/24   [provider]  losartan  (COZAAR ) 50 MG tablet Take 1 tablet (50 mg total) by mouth daily. 02/08/24 05/08/24  Croitoru, Mihai, MD  metoprolol  succinate (TOPROL -XL) 100 MG 24 hr tablet Take 1 tablet (100 mg total) by mouth daily. Patient taking differently: Take 100 mg by mouth daily. Pt states thatn he is taking 20mg  08/25/23   Wonda Sharper, MD  ondansetron  (ZOFRAN ) 8 MG tablet Take 8 mg by mouth 2 (two) times daily as needed for vomiting or nausea. 05/25/22   [provider]  oxyCODONE  (OXY IR/ROXICODONE ) 5 MG immediate release tablet Take 1 tablet (5 mg total) by mouth  every 4 (four) hours as needed for moderate pain (pain score 4-6). Patient not taking: Reported on 04/10/2024 01/20/24   Ebbie Cough, MD  pyridOXINE  (B-6) 50 MG tablet Take 1 tablet (50 mg total) by mouth daily. 02/28/23   Kathrin Mignon DASEN, MD  REPATHA  SURECLICK 140 MG/ML SOAJ INJECT 140 MGS SUBCUTANEOUSLY EVERY 14 DAYS 11/28/23   Croitoru, Mihai, MD  spironolactone  (ALDACTONE ) 25 MG tablet Take 25 mg by mouth daily. 03/07/24   [provider]  tamsulosin  (FLOMAX ) 0.4 MG CAPS capsule Take 0.4 mg by mouth every evening.    [provider]  warfarin (COUMADIN ) 2 MG tablet Take 2-4 mg by mouth See admin instructions. Take 2mg  (1 tablet) by mouth on weekdays and 4mg  (2 tablets) on weekends.    [provider]  Past Surgical History Past Surgical History:  Procedure Laterality Date   APPENDECTOMY     BACK SURGERY     CHOLECYSTECTOMY N/A 01/17/2024   Procedure: LAPAROSCOPIC CHOLECYSTECTOMY WITH ICG;  Surgeon: Ebbie Cough, MD;  Location: [facility] OR;  Service: General;  Laterality: N/A;   COLONOSCOPY N/A 04/13/2015   Procedure: COLONOSCOPY;  Surgeon: Lynwood Bohr, MD;  Location: [facility] ENDOSCOPY;  Service: Endoscopy;  Laterality: N/A;   CORONARY STENT INTERVENTION N/A 06/04/2022   Procedure: CORONARY STENT INTERVENTION;  Surgeon: Dann Candyce RAMAN, MD;  Location: [facility] INVASIVE CV LAB;  Service: Cardiovascular;  Laterality: N/A;   EMPYEMA DRAINAGE N/A 11/25/2014   Procedure: EMPYEMA DRAINAGE;  Surgeon: Dallas KATHEE Jude, MD;  Location: [facility] OR;  Service: Thoracic;  Laterality: N/A;   ESOPHAGOGASTRODUODENOSCOPY N/A 04/10/2015   Procedure: ESOPHAGOGASTRODUODENOSCOPY (EGD);  Surgeon: Jerrell Sol, MD;  Location: [facility] ENDOSCOPY;  Service: Endoscopy;  Laterality: N/A;   EYE SURGERY     both eyes, cataracts removed, denies lens implants   FLEXIBLE SIGMOIDOSCOPY  N/A 04/12/2015   Procedure: FLEXIBLE SIGMOIDOSCOPY;  Surgeon: Lynwood Bohr, MD;  Location: [facility] ENDOSCOPY;  Service: Endoscopy;  Laterality: N/A;   HERNIA REPAIR     LEFT HEART CATH AND CORONARY ANGIOGRAPHY N/A 06/04/2022   Procedure: LEFT HEART CATH AND CORONARY ANGIOGRAPHY;  Surgeon: Dann Candyce RAMAN, MD;  Location: [facility] INVASIVE CV LAB;  Service: Cardiovascular;  Laterality: N/A;   RIGHT HEART CATH AND CORONARY ANGIOGRAPHY N/A 09/28/2023   Procedure: RIGHT HEART CATH AND CORONARY ANGIOGRAPHY;  Surgeon: Wonda Sharper, MD;  Location: [facility] INVASIVE CV LAB;  Service: Cardiovascular;  Laterality: N/A;   SHOULDER SURGERY Right    TRANSCATHETER AORTIC VALVE REPLACEMENT,SUBCLAVIAN Left 11/08/2023   Procedure: Transcatheter Aortic Valve Replacement-Subclavian;  Surgeon: Wonda Sharper, MD;  Location: [facility] INVASIVE CV LAB;  Service: Cardiovascular;  Laterality: Left;   TRANSESOPHAGEAL ECHOCARDIOGRAM (CATH LAB) N/A 11/08/2023   Procedure: TRANSESOPHAGEAL ECHOCARDIOGRAM;  Surgeon: Wonda Sharper, MD;  Location: [facility] INVASIVE CV LAB;  Service: Cardiovascular;  Laterality: N/A;   VIDEO ASSISTED THORACOSCOPY Right 11/25/2014   Procedure: VIDEO ASSISTED THORACOSCOPY;  Surgeon: Dallas KATHEE Jude, MD;  Location: [facility] OR;  Service: Thoracic;  Laterality: Right;   VIDEO BRONCHOSCOPY N/A 11/25/2014   Procedure: VIDEO BRONCHOSCOPY;  Surgeon: Dallas KATHEE Jude, MD;  Location: [facility] OR;  Service: Thoracic;  Laterality: N/A;   Family History Family History  Problem Relation Age of Onset   CAD Father 20   Liver cancer Brother    CAD Sister     Social History Social History   Tobacco Use   Smoking status: Former    Current packs/day: 0.00    Types: Cigarettes    Quit date: 10/18/1992    Years since quitting: 31.5   Smokeless tobacco: Never  Vaping Use   Vaping status: Never Used  Substance Use Topics   Alcohol use: No   Drug use: No   Allergies Codeine  Review of Systems Review of Systems  Physical  Exam Vital Signs  I have reviewed the triage vital signs BP (!) 152/75   Pulse (!) 113   Temp 97.9 F (36.6 C) (Oral)   Resp (!) 23   Ht 6' 1 (1.854 m)   Wt 92.5 kg   SpO2 98%   BMI 26.91 kg/m   Physical Exam Vitals and nursing note reviewed.  Constitutional:      Appearance: He is not toxic-appearing.  Pulmonary:     Breath sounds: Examination of the left-lower field reveals wheezing. Wheezing  present. No decreased breath sounds, rhonchi or rales.  Chest:     Chest wall: No mass, tenderness or edema.  Abdominal:     Palpations: Abdomen is soft.   Musculoskeletal:        General: Normal range of motion.   Skin:    General: Skin is warm.   Neurological:     Mental Status: He is alert.     ED Results and Treatments Labs (all labs ordered are listed, but only abnormal results are displayed) Labs Reviewed  COMPREHENSIVE METABOLIC PANEL WITH GFR - Abnormal; Notable for the following components:      Result Value   CO2 14 (*)    Glucose, Bld 265 (*)    BUN 29 (*)    Creatinine, Ser 1.40 (*)    Calcium  8.7 (*)    Albumin 3.1 (*)    GFR, Estimated 51 (*)    All other components within normal limits  CBC - Abnormal; Notable for the following components:   WBC 14.3 (*)    RDW 15.9 (*)    All other components within normal limits  PROTIME-INR - Abnormal; Notable for the following components:   Prothrombin Time 41.2 (*)    INR 4.1 (*)    All other components within normal limits  URINALYSIS, W/ REFLEX TO CULTURE (INFECTION SUSPECTED) - Abnormal; Notable for the following components:   Glucose, UA >=500 (*)    Bacteria, UA RARE (*)    All other components within normal limits  LIPASE, BLOOD - Abnormal; Notable for the following components:   Lipase 293 (*)    All other components within normal limits  RESP PANEL BY RT-PCR (RSV, FLU A&B, COVID)  RVPGX2  CULTURE, BLOOD (ROUTINE X 2)  CULTURE, BLOOD (ROUTINE X 2)  MRSA NEXT GEN BY PCR, NASAL  I-STAT CG4 LACTIC  ACID, ED                                                                                                                          Radiology CT ABDOMEN PELVIS W CONTRAST Result Date: 04/11/2024 CLINICAL DATA:  Abdominal pain, acute, nonlocalized.  Sepsis. EXAM: CT ABDOMEN AND PELVIS WITH CONTRAST TECHNIQUE: Multidetector CT imaging of the abdomen and pelvis was performed using the standard protocol following bolus administration of intravenous contrast. RADIATION DOSE REDUCTION: This exam was performed according to the departmental dose-optimization program which includes automated exposure control, adjustment of the mA and/or kV according to patient size and/or use of iterative reconstruction technique. CONTRAST:  75mL OMNIPAQUE  IOHEXOL  350 MG/ML SOLN COMPARISON:  CT scan abdomen and pelvis from 11/21/2023. FINDINGS: Lower chest: There are mild peripheral/subpleural reticulations in the visualized bilateral lungs. No focal mass, consolidation, pleural effusion or pneumothorax. There are patchy areas of linear, plate-like atelectasis and/or scarring throughout bilateral lungs. There is mosaic attenuation of lungs, consistent with heterogeneous air trapping related to small airways disease. Normal heart size. No pericardial effusion. Hepatobiliary: The liver is normal in  size. Non-cirrhotic configuration. No suspicious mass. There is a stable 1.4 x 2.1 cm cyst in the right hepatic lobe, segment 7. No intrahepatic or extrahepatic bile duct dilation. Gallbladder is surgically absent. Pancreas: There is heterogeneous pancreatic attenuation and moderate peripancreatic fat stranding, compatible with acute interstitial pancreatitis. No suspicious mass. Main pancreatic duct is not dilated. Spleen: Within normal limits. No focal lesion. Adrenals/Urinary Tract: Adrenal glands are unremarkable. Bilateral small/atrophic kidneys again seen. There are multiple simple cyst throughout bilateral kidneys with largest sinus cyst  in the left kidney interpolar region measuring up to 2.5 x 3.3 cm. No nephroureterolithiasis or obstructive uropathy on either side. Bladder wall trabeculations noted, suggesting sequela of chronic urinary outflow obstruction. Otherwise unremarkable urinary bladder. Stomach/Bowel: There is a tiny sliding hiatal hernia. No disproportionate dilation of the small or large bowel loops. No evidence of abnormal bowel wall thickening or inflammatory changes. The appendix was not visualized; however there is no acute inflammatory process in the right lower quadrant. Note is made of redundant sigmoid colon. There is moderate amount of stool throughout the colon. Vascular/Lymphatic: No ascites or pneumoperitoneum. No abdominal or pelvic lymphadenopathy, by size criteria. Redemonstration of chronic intraluminal/intramural thrombus in the abdominal aorta. There is complete occlusion of the lower portion of abdominal aorta as well as bilateral common iliac arteries. There is reconstitution from bilateral inferior epigastric arteries. There are marked peripheral atherosclerotic vascular calcifications of the aorta and its major branches. Reproductive: Enlarged prostate. Symmetric seminal vesicles. Other: There is tiny fat containing periumbilical hernia. There are also fat containing bilateral inguinal hernias. The soft tissues and abdominal wall are otherwise unremarkable. Musculoskeletal: No suspicious osseous lesions. There are moderate multilevel degenerative changes in the visualized spine. IMPRESSION: 1. Findings compatible with acute interstitial pancreatitis. No peripancreatic fluid collection or abscess. No pancreatic necrosis. 2. Multiple other nonacute observations, as described above. Aortic Atherosclerosis (ICD10-I70.0). Electronically Signed   By: Ree Molt M.D.   On: 04/11/2024 12:35   DG Chest Port 1 View Result Date: 04/11/2024 CLINICAL DATA:  shortness of breath EXAM: PORTABLE CHEST - 1 VIEW COMPARISON:   03/09/2024 FINDINGS: Coarse bibasilar opacities left greater than right slightly increased from previous. Heart size and mediastinal contours are within normal limits. Aortic Atherosclerosis (ICD10-170.0). Chronic blunting of left lateral costophrenic angle. Electronic device projects over the left upper lung. Left shoulder DJD. IMPRESSION: Coarse bibasilar opacities left greater than right, slightly increased from previous. Electronically Signed   By: JONETTA Faes M.D.   On: 04/11/2024 11:16    Pertinent labs & imaging results that were available during my care of the patient were reviewed by me and considered in my medical decision making (see MDM for details).  Medications Ordered in ED Medications  lactated ringers  infusion ( Intravenous New Bag/Given 04/11/24 1334)  HYDROmorphone  (DILAUDID ) injection 1 mg (has no administration in time range)  lactated ringers  bolus 1,000 mL (0 mLs Intravenous Stopped 04/11/24 1255)  oxyCODONE -acetaminophen  (PERCOCET/ROXICET) 5-325 MG per tablet 1 tablet (1 tablet Oral Given 04/11/24 1032)  vancomycin  (VANCOREADY) IVPB 2000 mg/400 mL (0 mg Intravenous Stopped 04/11/24 1334)  lactated ringers  bolus 1,000 mL (0 mLs Intravenous Stopped 04/11/24 1301)  piperacillin -tazobactam (ZOSYN ) IVPB 3.375 g (0 g Intravenous Stopped 04/11/24 1106)  iohexol  (OMNIPAQUE ) 350 MG/ML injection 75 mL (75 mLs Intravenous Contrast Given 04/11/24 1213)  fentaNYL  (SUBLIMAZE ) injection 50 mcg (50 mcg Intravenous Given 04/11/24 1312)  Procedures Procedures  (including critical care time)  Medical Decision Making / ED Course   This patient presents to the ED for concern of cough, shortness of breath, chest pain, this involves an extensive number of treatment options, and is a complaint that carries with it a high risk of complications and morbidity.  The  differential diagnosis includes pneumonia, less likely ACS, less likely PE, malignancy, intra-abdominal infection.  MDM: Patient blood work yesterday showed white count of 20, with his recent history of pneumonia, there is concern for recurrent symptoms.  Will obtain plain films of the patient's chest.  He is also endorsing some generalized abdominal discomfort.  Will obtain imaging of the patient's abdomen.  Patient on Coumadin , states he is compliant.  Will check INR.  With the patient's elevated heart rate and tachypnea, he meets sepsis criteria.  Will begin to empirically treat the patient.  Out of concern for fluid overload, will provide patient with 1 L fluid and reassess.  Reassessment 1:30 PM-patient without an elevated lactic acid.  His INR is therapeutic.  He did have brief episode of SVT here in the emergency room that resolved with vagal maneuvers.  Patient with pancreatitis.  He has a history of gallstone pancreatitis.  No collection.  He has received broad-spectrum antibiotics for pneumonia that was identified on his chest x-ray.  Additional history obtained: -Additional history obtained from EMS -External records from outside source obtained and reviewed including: Chart review including previous notes, labs, imaging, consultation notes   Lab Tests: -I ordered, reviewed, and interpreted labs.   The pertinent results include:   Labs Reviewed  COMPREHENSIVE METABOLIC PANEL WITH GFR - Abnormal; Notable for the following components:      Result Value   CO2 14 (*)    Glucose, Bld 265 (*)    BUN 29 (*)    Creatinine, Ser 1.40 (*)    Calcium  8.7 (*)    Albumin 3.1 (*)    GFR, Estimated 51 (*)    All other components within normal limits  CBC - Abnormal; Notable for the following components:   WBC 14.3 (*)    RDW 15.9 (*)    All other components within normal limits  PROTIME-INR - Abnormal; Notable for the following components:   Prothrombin Time 41.2 (*)    INR 4.1 (*)     All other components within normal limits  URINALYSIS, W/ REFLEX TO CULTURE (INFECTION SUSPECTED) - Abnormal; Notable for the following components:   Glucose, UA >=500 (*)    Bacteria, UA RARE (*)    All other components within normal limits  LIPASE, BLOOD - Abnormal; Notable for the following components:   Lipase 293 (*)    All other components within normal limits  RESP PANEL BY RT-PCR (RSV, FLU A&B, COVID)  RVPGX2  CULTURE, BLOOD (ROUTINE X 2)  CULTURE, BLOOD (ROUTINE X 2)  MRSA NEXT GEN BY PCR, NASAL  I-STAT CG4 LACTIC ACID, ED      EKG sinus tachycardia  EKG Interpretation Date/Time:    Ventricular Rate:    PR Interval:    QRS Duration:    QT Interval:    QTC Calculation:   R Axis:      Text Interpretation:           Imaging Studies ordered: I ordered imaging studies including x-ray, CT imaging of the abdomen pelvis I independently visualized and interpreted imaging. I agree with the radiologist interpretation   Medicines ordered and prescription drug management:  Meds ordered this encounter  Medications   lactated ringers  infusion   lactated ringers  bolus 1,000 mL    Reason 30 mL/kg dose is not being ordered:   First Lactic Acid Pending   DISCONTD: ceFEPIme  (MAXIPIME ) 2 g in sodium chloride  0.9 % 100 mL IVPB    Antibiotic Indication::   Other Indication (list below)    Other Indication::   Unknown Source.   DISCONTD: metroNIDAZOLE  (FLAGYL ) IVPB 500 mg    Antibiotic Indication::   Other Indication (list below)    Other Indication::   Unknown Source.   DISCONTD: vancomycin  (VANCOCIN ) IVPB 1000 mg/200 mL premix    Indication::   Other Indication (list below)    Other Indication::   Unknown Source.   oxyCODONE -acetaminophen  (PERCOCET/ROXICET) 5-325 MG per tablet 1 tablet    Refill:  0   vancomycin  (VANCOREADY) IVPB 2000 mg/400 mL    Indication::   Other Indication (list below)    Other Indication::   Unknown Source.   lactated ringers  bolus 1,000 mL    piperacillin -tazobactam (ZOSYN ) IVPB 3.375 g    Antibiotic Indication::   Sepsis   iohexol  (OMNIPAQUE ) 350 MG/ML injection 75 mL   fentaNYL  (SUBLIMAZE ) injection 50 mcg   HYDROmorphone  (DILAUDID ) injection 1 mg    -I have reviewed the patients home medicines and have made adjustments as needed   Cardiac Monitoring: The patient was maintained on a cardiac monitor.  I personally viewed and interpreted the cardiac monitored which showed an underlying rhythm of:    Reevaluation: After the interventions noted above, I reevaluated the patient and found that they have :improved  Co morbidities that complicate the patient evaluation  Past Medical History:  Diagnosis Date   AAA (abdominal aortic aneurysm) (HCC)    Blood dyscrasia    followed by Dr. Cloretta, for clotting issue, has been on Coumadin  for about 20 yrs.     CAD (coronary artery disease)    Chronic kidney disease    COPD (chronic obstructive pulmonary disease) (HCC)    Diabetes mellitus without complication (HCC)    DJD (degenerative joint disease)    Dyslipidemia    Dyspnea    Gout    Malignant hypertension    Peripheral vascular disease (HCC)    Pneumonia 08/18/2013   pt. 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 Wonda and Dr. Lucas   Severe aortic stenosis    Venous thrombosis    Recurrent           Final Clinical Impression(s) / ED Diagnoses Final diagnoses:  Acute pancreatitis without infection or necrosis, unspecified pancreatitis type  Hypoxia  Community acquired pneumonia of left lower lobe of lung     @PCDICTATION @    Mannie Pac T, DO 04/11/24 1416

## 2024-04-11 NOTE — Progress Notes (Signed)
 Pt admitted to 3W18 from ED. Initiated tele. Obtained VS. Oriented pt to the unit. Call bell within reach.   Amado GORMAN Arabia, RN

## 2024-04-11 NOTE — H&P (Addendum)
 Date: 04/11/2024               Patient Name:  Tony Zhang MRN: [id number]  DOB: 08-Dec-1945 Age / Sex: 78 y.o., male   PCP: Hunter Mickey Browner, PA-C              Medical Service: Internal Medicine Teaching Service              Attending Physician: Dr. Trudy, Mliss Dragon, MD    First Contact: Damien Heron, MS4    Second Contact: Dr. Harrie Pager: [phone]  Third Contact Dr. Elnora         After Hours (After 5p/  First Contact Pager: [phone]  weekends / holidays): Second Contact Pager: [phone]   Chief Complaint: Shortness of breath   History of Present Illness: Tony Zhang is a 78yo M with a significant past medical history of AAA, CAD, COPD with emphysema, DM on Jardiacnce, HLD, HTN, PAD, prior Hx of DVT/PE, s/p TAVR in Jan 2025, and pancreatitis s/p cholecystectomy on 01/2024 presenting to the ED as directed from cardiology office for worsening leukocytosis and reports of abdominal pain  Patient reports that he was admitted for pneumonia 01/2024 and was given antibiotics. Notes that his primary preceding symptoms were shortness of breath and productive cough but otherwise did not have fevers. His breathing felt better when he was discharged from the hospital. Since then, he has experienced shortness of breath with ambulation, such that he needs to rest and catch his breath. He was re-evaluated for same on 02/2022 and was given a second course of oral antibiotics, which he finished. He continues to use supplemental O2 2-3L intermittently, mostly when exerted since 01/2024.  Of note, patient reports that he has been endorsing left upper quadrant abdominal pain (there are triage notes that mention this since 01/2024). This has significantly gotten worse over the past week, especially with deep inspiration. No radiation to the back, arm, jaw, or lower extremities. No chest pain, but does endorse heart quivering/palpitations over the last few nights while laying in bed. This pain  prompted him to present to urgent care on 6/23. He had a chest XR and was told he had recurrent pneumonia. Since then, he has been taking hydrocodone  1x/day, prednisone , and an antibiotic - all of which were prescribed by the urgent care.   He was subsequently seen by his cardiologist yesterday for follow-up. They recommended that he present to the ED and get an echo due to concern for sinus tachycardia/rib pain/recurrent pneumonia, however the patient did not want to wait for a long time at the hospital. This morning, he received a call from the cardiologist stating that his white blood count was high and that he needed to go to the ED.   On evaluation in the ED, he also notes that he has experienced significant back and abdominal pain, primarily in the LUQ. It radiates to the umbilicus and the LLQ. Currently rates it a 8/10.  Denies any nausea or vomiting. His oral intake has been unchanged over this period and he continues to eat full meals. Denies diarrhea, constipation, melena, and hematochezia. No leg swelling, fevers, or chills. He has not had any visual changes but has felt lightheaded when standing intermittently. Endorses some burning with urination and urinary frequency but attributes this to drinking 2 Gatorades every week to help him pee. He denies any cough since he was admitted for pneumonia last month.  Patient has not noticed swelling of his  lower extremities, abdomen, or arms. He is confused about needing O2 in the hospital he has been able to breath without his supplemental O2 at home when laying in bed. He is requesting a portable O2 if possible.   Past Medical History:  Past Medical History:  Diagnosis Date   AAA (abdominal aortic aneurysm) (HCC)    Blood dyscrasia    followed by Dr. Cloretta, for clotting issue, has been on Coumadin  for about 20 yrs.     CAD (coronary artery disease)    Chronic kidney disease    COPD (chronic obstructive pulmonary disease) (HCC)    Diabetes  mellitus without complication (HCC)    DJD (degenerative joint disease)    Dyslipidemia    Dyspnea    Gout    Malignant hypertension    Peripheral vascular disease (HCC)    Pneumonia 08/18/2013   pt. 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 Wonda and Dr. Lucas   Severe aortic stenosis    Venous thrombosis    Recurrent    Meds:   Patient is unable to confirm his home medications by name. He manages them by himself and is only able to recall dosages, which is how he identifies them Allopurinol  300 mg nightly  Hydrocodone -acetaminophen  325 PRN (since 6/21) Atorvastatin  20 mg daily  Fenofibric acid  135 mg daily  Lasix  20 mg PRN - unclear if patient is taking this  Losartan  50 mg daily  Metoprolol  succinate 100 mg daily Repatha  injections q14d Spironolactone  25 mg daily  Jardiance  10 mg daily  Flomax  0.4 mg nightly  Cilostazol  100 mg daily  Warfarin 2 mg daily Gabapentin  600 mg TID Albuterol  PRN Symbicort  2 puffs BID  Allergies: Allergies as of 04/11/2024 - Review Complete 04/11/2024  Allergen Reaction Noted   Codeine Hives 09/29/2011   Past Medical History:  Diagnosis Date   AAA (abdominal aortic aneurysm) (HCC)    Blood dyscrasia    followed by Dr. Cloretta, for clotting issue, has been on Coumadin  for about 20 yrs.     CAD (coronary artery disease)    Chronic kidney disease    COPD (chronic obstructive pulmonary disease) (HCC)    Diabetes mellitus without complication (HCC)    DJD (degenerative joint disease)    Dyslipidemia    Dyspnea    Gout    Malignant hypertension    Peripheral vascular disease (HCC)    Pneumonia 08/18/2013   pt. reports that he is having this surgery 11/25/2014- for the lung problem that began with pneumonia in 09/2014   S/P TAVR  (transcatheter aortic valve replacement) 11/08/2023   s/p TAVR with a 26 mm Edwards S3UR via the left subclavian approach by Dr. Wonda and Dr. Lucas   Severe aortic stenosis    Venous thrombosis    Recurrent    Family History:   Family History  Problem Relation Age of Onset   CAD Father 57   Liver cancer Brother    CAD Sister    Social History:  Lives at home by himself.  Manages medications on his own - sorts them from the bottles. States that he does not know his medication names and differentiates them by mg amounts.  Ambulates using a cane and by supporting himself on furniture surfaces.  Reports independence  in ADLs and iADLs. Enjoys cooking for himself.  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  Denies alcohol use. Previously smoked cigarettes/cigars but quit in 1980. No illicit drug use.   Review of Systems: A complete ROS was negative except as per HPI.   Physical Exam: Blood pressure (!) 153/81, pulse (!) 106, temperature 97.9 F (36.6 C), temperature source Oral, resp. rate (!) 22, height 6' 1 (1.854 m), weight 92.5 kg, SpO2 95%. General: Uncomfortable-appearing, laying in bed in NAD.  HEENT: Mucous membranes moist. No conjunctival erythema. No rhinorrhea.  Cardio: Tachycardic; unable to assess murmurs. No JVD. No LE edema. Radial and DP pulses present. Pulm: No increased work of breathing on room air. Speaking in full sentences. Mild decreased air movement in the bilateral lung bases otherwise clear to auscultation bilaterally without wheezing. Purse lip breathing with increased exhalation phase Abd: Well-healed 4 scars from prior cholecystectomy. Soft, non-distended. Diffuse tenderness to palpation however most prominent with guarding in the LUQ and LLQ. No ecchymoses on bilateral flanks or umbilicus. No rebound. No hepatosplenomegaly. Normoactive bowel sounds.  MSK: Moving all extremities equally. Normal bulk and tone. TTP of the  bilateral paraspinal muscles.  Skin: Warm and dry.  Neuro: Alert and oriented. Moving in bed by self using all four extremities. Psych: Conversational. Normal mood and affect.   EKG: personally reviewed my interpretation is sinus tachycardia without evidence of ischemia.   CXR: coarse bibasilar opacities (L > R), slightly increased from previous. Chronic blunting of left lateral costophrenic angle.   CT chest: - Emphysema, bibasilar subpleural lung scarring - No focal consolidation, pleural effusion, or pneumothorax - Acute pancreatitis seen  CT abdomen and pelvis: - Acute interstitial pancreatitis with moderate peripancreatic fat stranding  - No peripancreatic fluid collection or abscess. 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   Abdominal pain  Acute interstitial pancreatitis   Leukocytosis  Hx gallstone pancreatitis s/p cholecystectomy (01/2024) Patient presents with approximately 3 weeks of progressive left-sided abdominal pain. CT A/P obtained in the ED demonstrated acute interstitial pancreatitis. Lactate WNL, Lipase 293. Unsure the etiology of his pancreatitis at this time given he has a hx of gallstone pancreatitis but is now s/p cholecystectomy. No alcohol use. Other etiologies may include retained choledocholithiasis, medication-induced, or idiopathic. His calcium  is not elevated and his most recent triglycerides were 222 in 02/2024 (not significantly elevated). No recent procedure such as ERCP.  - Anticipate AM  MRCP to identify biliary vs pancreatic pathology  - LR 100 mL/hr x 12 hrs. Re-evaluate in AM.  - Pain control: Hydromorphone  1 mg q4h PRN - Follow up blood cultures x 2 - Stop Prednisone   Dyspnea  Recent pneumonia  Hx severe emphysema  Admitted from 4/28-5/3 for AHRF 2/2 LLL CAP during which he completed a course of CTX + azithromycin . Seen at UC this past weekend during which there was concern for potential recurrent PNA, thus patient was prescribed doxycycline and prednisone . XR chest demonstrated coarse bibasilar opacities (L > R). CT chest showed subpleural scarring consistent with emphysema but did not show any pleural effusion or consolidation. Patient is without clinical signs of pneumonia including fever and productive cough, thus low suspicion for recurrent pneumonia at this time. He is s/p vancomycin  and Zosyn  x 1 due to initial concern, however will discontinue antibiotics for now. COVID/Flu/RSV negative. He was initially placed on 1L [place] in the ED but has since been weaned to RA and is satting appropriately. Suspect leukocytosis is 2/2 acute interstitial pancreatitis as described above as well as recent course of prednisone  obtained at [facility]. No wheezing on exam to suggest acute flare of his emphysema.  - Continue home Symbicort , albuterol   - OT/PT -  Stop prednisone  and Abx - Follow blood cultures  Hx of DVT/PE  Long-term anticoagulation, supratherapeutic  On chronic Warfarin for > 20 years. INR has been intermittently subtherapeutic since 10/2023. Now presenting supratherapeutic with INR 4.1. Difficult to assess how patient manages taking his medications while knowing them by dosage and not name. Therefore, concern for potentially taking more Warfarin than prescribed, however difficult to assess at this time.  - HOLD home Warfarin - Follow up repeat INR - Anticoagulation per pharmacy   CAD  Hx NSTEMI  S/p DES-LAD (05/2022)  High-grade stenosis of the RCA Patient remains asymptomatic without chest pain. Per Cardiology, he has a high-grade stenosis in the proximal right coronary artery that is difficult for percutaneous revascularization. Most recent cath (09/2023) demonstrated patency of LAD stent.  - Continue home atorvastatin   HFpEF  Aortic stenosis s/p TAVR (10/2023) Most recent echo 12/07/2023 demonstrated LVEF 60-65%, moderate LVH. Clinically appears euvolemic. Appropriate prosthetic hemodynamics since TAVR per Cardiology.  - Follow up echo  - Continue home Jardiance   - HOLD home Lasix    Hypertension BP elevated in Cardiology office yesterday and here, 150's/80's. As of yesterday, Cardiology planned to continue home losartan  and restart spironolactone  after following up his labs. Suspect acute pain and inflammation are contributing to current high BP.  - Continue metoprolol  succinate 100 mg - HOLD home spironolactone  25 mg daily  - HOLD home losartan  50 mg daily; there is question whether patient re-started this med.  Hyperlipidemia Well-controlled with most recent LDL 31.  - On home Repatha  - Continue home fenofibric acid    PAD  Chronic occlusion of the infrarenal abdominal aorta  Longstanding history of occlusion of the distal abdominal aorta with reconstitution at the level of the common femoral arteries via inferior  epigastric arteries.  - HOLD home Cilostazol    Polypharmacy At risk medication management At risk medications with questionable management by patient. Will need reconciliation prior to discharge  Diet: NPO, advance as tolerated  IVF: LR 100 mL/hr for 14 hrs. Reassess in the AM VTE: SCDs; supratherapeutic INR Code: FULL  Dispo: Admit patient to Observation with expected length of stay less than 2 midnights.  Signed: Damien Heron, MS4 04/11/2024, 6:25 PM    Attestation for  Student Documentation:  I personally was present and re-performed the history, physical exam and medical decision-making activities of this service and have verified that the service and findings are accurately documented in the student's note.  Elnora Ip, MD 04/11/2024, 6:31 PM

## 2024-04-12 ENCOUNTER — Other Ambulatory Visit (HOSPITAL_COMMUNITY): Payer: Self-pay

## 2024-04-12 ENCOUNTER — Telehealth (HOSPITAL_COMMUNITY): Payer: Self-pay | Admitting: Pharmacy Technician

## 2024-04-12 ENCOUNTER — Observation Stay (HOSPITAL_COMMUNITY)

## 2024-04-12 DIAGNOSIS — N400 Enlarged prostate without lower urinary tract symptoms: Secondary | ICD-10-CM | POA: Diagnosis present

## 2024-04-12 DIAGNOSIS — K861 Other chronic pancreatitis: Secondary | ICD-10-CM | POA: Diagnosis not present

## 2024-04-12 DIAGNOSIS — J439 Emphysema, unspecified: Secondary | ICD-10-CM | POA: Diagnosis present

## 2024-04-12 DIAGNOSIS — Z952 Presence of prosthetic heart valve: Secondary | ICD-10-CM | POA: Diagnosis not present

## 2024-04-12 DIAGNOSIS — Z86711 Personal history of pulmonary embolism: Secondary | ICD-10-CM

## 2024-04-12 DIAGNOSIS — K859 Acute pancreatitis without necrosis or infection, unspecified: Secondary | ICD-10-CM | POA: Diagnosis not present

## 2024-04-12 DIAGNOSIS — E538 Deficiency of other specified B group vitamins: Secondary | ICD-10-CM | POA: Diagnosis present

## 2024-04-12 DIAGNOSIS — I700 Atherosclerosis of aorta: Secondary | ICD-10-CM | POA: Diagnosis present

## 2024-04-12 DIAGNOSIS — G8929 Other chronic pain: Secondary | ICD-10-CM | POA: Diagnosis present

## 2024-04-12 DIAGNOSIS — D72829 Elevated white blood cell count, unspecified: Secondary | ICD-10-CM

## 2024-04-12 DIAGNOSIS — I4891 Unspecified atrial fibrillation: Secondary | ICD-10-CM | POA: Diagnosis present

## 2024-04-12 DIAGNOSIS — N179 Acute kidney failure, unspecified: Secondary | ICD-10-CM | POA: Diagnosis not present

## 2024-04-12 DIAGNOSIS — K858 Other acute pancreatitis without necrosis or infection: Secondary | ICD-10-CM | POA: Diagnosis present

## 2024-04-12 DIAGNOSIS — I5032 Chronic diastolic (congestive) heart failure: Secondary | ICD-10-CM | POA: Diagnosis present

## 2024-04-12 DIAGNOSIS — E1151 Type 2 diabetes mellitus with diabetic peripheral angiopathy without gangrene: Secondary | ICD-10-CM | POA: Diagnosis present

## 2024-04-12 DIAGNOSIS — M109 Gout, unspecified: Secondary | ICD-10-CM | POA: Diagnosis present

## 2024-04-12 DIAGNOSIS — R0602 Shortness of breath: Secondary | ICD-10-CM | POA: Diagnosis present

## 2024-04-12 DIAGNOSIS — I130 Hypertensive heart and chronic kidney disease with heart failure and stage 1 through stage 4 chronic kidney disease, or unspecified chronic kidney disease: Secondary | ICD-10-CM | POA: Diagnosis present

## 2024-04-12 DIAGNOSIS — I10 Essential (primary) hypertension: Secondary | ICD-10-CM

## 2024-04-12 DIAGNOSIS — Z7901 Long term (current) use of anticoagulants: Secondary | ICD-10-CM | POA: Diagnosis not present

## 2024-04-12 DIAGNOSIS — Z8616 Personal history of COVID-19: Secondary | ICD-10-CM | POA: Diagnosis not present

## 2024-04-12 DIAGNOSIS — Z1152 Encounter for screening for COVID-19: Secondary | ICD-10-CM | POA: Diagnosis not present

## 2024-04-12 DIAGNOSIS — N1831 Chronic kidney disease, stage 3a: Secondary | ICD-10-CM | POA: Diagnosis present

## 2024-04-12 DIAGNOSIS — R0600 Dyspnea, unspecified: Secondary | ICD-10-CM | POA: Diagnosis not present

## 2024-04-12 DIAGNOSIS — E1122 Type 2 diabetes mellitus with diabetic chronic kidney disease: Secondary | ICD-10-CM | POA: Diagnosis present

## 2024-04-12 DIAGNOSIS — I2510 Atherosclerotic heart disease of native coronary artery without angina pectoris: Secondary | ICD-10-CM | POA: Diagnosis present

## 2024-04-12 DIAGNOSIS — I739 Peripheral vascular disease, unspecified: Secondary | ICD-10-CM

## 2024-04-12 DIAGNOSIS — R0902 Hypoxemia: Secondary | ICD-10-CM | POA: Diagnosis present

## 2024-04-12 DIAGNOSIS — J189 Pneumonia, unspecified organism: Secondary | ICD-10-CM | POA: Diagnosis not present

## 2024-04-12 DIAGNOSIS — Z7951 Long term (current) use of inhaled steroids: Secondary | ICD-10-CM | POA: Diagnosis not present

## 2024-04-12 DIAGNOSIS — Z86718 Personal history of other venous thrombosis and embolism: Secondary | ICD-10-CM

## 2024-04-12 DIAGNOSIS — E785 Hyperlipidemia, unspecified: Secondary | ICD-10-CM | POA: Diagnosis present

## 2024-04-12 DIAGNOSIS — R791 Abnormal coagulation profile: Secondary | ICD-10-CM | POA: Diagnosis present

## 2024-04-12 DIAGNOSIS — I471 Supraventricular tachycardia, unspecified: Secondary | ICD-10-CM | POA: Diagnosis present

## 2024-04-12 LAB — COMPREHENSIVE METABOLIC PANEL WITH GFR
ALT: 15 U/L (ref 0–44)
AST: 17 U/L (ref 15–41)
Albumin: 2.9 g/dL — ABNORMAL LOW (ref 3.5–5.0)
Alkaline Phosphatase: 41 U/L (ref 38–126)
Anion gap: 11 (ref 5–15)
BUN: 21 mg/dL (ref 8–23)
CO2: 19 mmol/L — ABNORMAL LOW (ref 22–32)
Calcium: 8.6 mg/dL — ABNORMAL LOW (ref 8.9–10.3)
Chloride: 107 mmol/L (ref 98–111)
Creatinine, Ser: 1.06 mg/dL (ref 0.61–1.24)
GFR, Estimated: >60 mL/min (ref >60)
Glucose, Bld: 100 mg/dL — ABNORMAL HIGH (ref 70–99)
Potassium: 3.5 mmol/L (ref 3.5–5.1)
Sodium: 137 mmol/L (ref 135–145)
Total Bilirubin: 1.1 mg/dL (ref 0.0–1.2)
Total Protein: 6.2 g/dL — ABNORMAL LOW (ref 6.5–8.1)

## 2024-04-12 LAB — PROTIME-INR
INR: 3.5 — ABNORMAL HIGH (ref 0.8–1.2)
Prothrombin Time: 36.5 s — ABNORMAL HIGH (ref 11.4–15.2)

## 2024-04-12 LAB — LIPID PANEL
Cholesterol: 78 mg/dL (ref 0–200)
HDL: 37 mg/dL — ABNORMAL LOW (ref >40)
LDL Cholesterol: 9 mg/dL (ref 0–99)
Total CHOL/HDL Ratio: 2.1 ratio
Triglycerides: 159 mg/dL — ABNORMAL HIGH (ref <150)
VLDL: 32 mg/dL (ref 0–40)

## 2024-04-12 LAB — CBC WITH DIFFERENTIAL/PLATELET
Abs Immature Granulocytes: 0.09 10*3/uL — ABNORMAL HIGH (ref 0.00–0.07)
Basophils Absolute: 0 10*3/uL (ref 0.0–0.1)
Basophils Relative: 0 %
Eosinophils Absolute: 0.1 10*3/uL (ref 0.0–0.5)
Eosinophils Relative: 2 %
HCT: 46.7 % (ref 39.0–52.0)
Hemoglobin: 15.3 g/dL (ref 13.0–17.0)
Immature Granulocytes: 1 %
Lymphocytes Relative: 7 %
Lymphs Abs: 0.6 10*3/uL — ABNORMAL LOW (ref 0.7–4.0)
MCH: 29.9 pg (ref 26.0–34.0)
MCHC: 32.8 g/dL (ref 30.0–36.0)
MCV: 91.4 fL (ref 80.0–100.0)
Monocytes Absolute: 0.7 10*3/uL (ref 0.1–1.0)
Monocytes Relative: 8 %
Neutro Abs: 7.3 10*3/uL (ref 1.7–7.7)
Neutrophils Relative %: 82 %
Platelets: 198 10*3/uL (ref 150–400)
RBC: 5.11 MIL/uL (ref 4.22–5.81)
RDW: 15.5 % (ref 11.5–15.5)
WBC: 8.8 10*3/uL (ref 4.0–10.5)
nRBC: 0 % (ref 0.0–0.2)

## 2024-04-12 LAB — ECHOCARDIOGRAM COMPLETE
AV Mean grad: 5 mmHg
AV Peak grad: 10.1 mmHg
Ao pk vel: 1.59 m/s
Calc EF: 78.9 %
Height: 73 in
S' Lateral: 3 cm
Single Plane A2C EF: 78.6 %
Single Plane A4C EF: 78.4 %
Weight: 3264 [oz_av]

## 2024-04-12 MED ORDER — SPIRONOLACTONE 25 MG PO TABS
25.0000 mg | ORAL_TABLET | Freq: Every day | ORAL | Status: DC
  Administered 2024-04-12 – 2024-04-13 (×2): 25 mg via ORAL
  Filled 2024-04-12 (×2): qty 1

## 2024-04-12 MED ORDER — GABAPENTIN 300 MG PO CAPS
600.0000 mg | ORAL_CAPSULE | Freq: Two times a day (BID) | ORAL | Status: DC
  Administered 2024-04-12 – 2024-04-15 (×6): 600 mg via ORAL
  Filled 2024-04-12 (×7): qty 2

## 2024-04-12 MED ORDER — HYDROMORPHONE HCL 1 MG/ML IJ SOLN
1.0000 mg | INTRAMUSCULAR | Status: DC | PRN
  Administered 2024-04-12 – 2024-04-13 (×3): 1 mg via INTRAVENOUS
  Filled 2024-04-12 (×4): qty 1

## 2024-04-12 MED ORDER — KETOROLAC TROMETHAMINE 15 MG/ML IJ SOLN
15.0000 mg | Freq: Three times a day (TID) | INTRAMUSCULAR | Status: DC | PRN
  Administered 2024-04-13 – 2024-04-14 (×2): 15 mg via INTRAVENOUS
  Filled 2024-04-12 (×2): qty 1

## 2024-04-12 MED ORDER — ACETAMINOPHEN 500 MG PO TABS
1000.0000 mg | ORAL_TABLET | Freq: Four times a day (QID) | ORAL | Status: DC
  Administered 2024-04-12 – 2024-04-16 (×15): 1000 mg via ORAL
  Filled 2024-04-12 (×18): qty 2

## 2024-04-12 MED ORDER — PERFLUTREN LIPID MICROSPHERE
1.0000 mL | INTRAVENOUS | Status: AC | PRN
  Administered 2024-04-12: 2 mL via INTRAVENOUS

## 2024-04-12 MED ORDER — LOSARTAN POTASSIUM 50 MG PO TABS
50.0000 mg | ORAL_TABLET | Freq: Every day | ORAL | Status: DC
  Administered 2024-04-12 – 2024-04-13 (×2): 50 mg via ORAL
  Filled 2024-04-12 (×2): qty 1

## 2024-04-12 MED ORDER — HYDROMORPHONE HCL 1 MG/ML IJ SOLN
1.0000 mg | INTRAMUSCULAR | Status: DC | PRN
  Administered 2024-04-12: 1 mg via INTRAVENOUS
  Filled 2024-04-12: qty 1

## 2024-04-12 NOTE — Care Management Obs Status (Signed)
 MEDICARE OBSERVATION STATUS NOTIFICATION   Patient Details  Name: Tony Zhang MRN: [id number] Date of Birth: 09-22-1946   Medicare Observation Status Notification Given:  Yes  Explained patient observation status patient  understood but declined getting a copy  Janny Crute 04/12/2024, 10:23 AM

## 2024-04-12 NOTE — Progress Notes (Signed)
 OT Cancellation Note  Patient Details Name: Tony Zhang MRN: [id number] DOB: [birth year]   Cancelled Treatment:    Reason Eval/Treat Not Completed: Patient declined, no reason specified (pt c/o pain and declined therapy at this time. Politely states he would like therapy to leave his room and that he would be more open tomorrow. OT to follow-up with pt as able)  04/12/2024  AB, OTR/L  Acute Rehabilitation Services  Office: [phone]   [name] 04/12/2024, 1:02 PM

## 2024-04-12 NOTE — Progress Notes (Signed)
 PT Cancellation Note  Patient Details Name: Tony Zhang MRN: [id number] DOB: [birth year]   Cancelled Treatment:    Reason Eval/Treat Not Completed: Patient declined, no reason specified (Pt declined PT eval after two seperate attempts due to high levels of pain in abdomen despite pre-medication. Pt requested for PT to follow-up tomorrow.)  Letisha Yera W, PT, DPT Secure Chat [facility] Office [phone]   Kate BRAVO Wendolyn 04/12/2024, 1:18 PM

## 2024-04-12 NOTE — Progress Notes (Addendum)
 Subjective:  Patient Summary: Tony Zhang is a 78 y.o. 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   Overnight Events/Interim History: No acute events overnight. Received Dilaudid  1 mg x 3 for abdominal pain. On evaluation this morning, patient reports continued severe pain to the left side of his abdomen and under his L ribs which is unchanged from yesterday. It feels like a throbbing sensation that alternates between dull and sharp. He has been able to eat and drink without nausea or emesis. Denies shortness of breath, chest pain, or palpitations. Hoping for his pain to be better controlled today.   Objective:  Vital signs in last 24 hours: Vitals:   04/12/24 2341 04/13/24 0733 04/13/24 1234 04/13/24 1518  BP: (!) 139/47 (!) 101/46 97/61 (!) 84/62  Pulse: 84 83 79 74  Resp: 18 19 18 18   Temp: 97.8 F (36.6 C) 97.7 F (36.5 C) 97.6 F (36.4 C) 98.4 F (36.9 C)  TempSrc: Oral Oral Oral Oral  SpO2: 91% 92% 93% 90%  Weight:      Height:       Weight change:   Intake/Output Summary (Last 24 hours) at 04/13/2024 1802 Last data filed at 04/13/2024 0851 Gross per 24 hour  Intake 480 ml  Output 850 ml  Net -370 ml   Physical Exam -  General: Uncomfortable-appearing, laying in bed in NAD.  HEENT: Mucous membranes moist. No conjunctival erythema. No rhinorrhea.  Cardio: Tachycardic rate, regular rhythm. No JVD. No LE edema. Radial and DP pulses present. Pulm: No increased work of breathing on room air. Speaking in full sentences. Lungs clear to auscultation bilaterally without wheezing. Abd: Well-healed 4 scars from prior  cholecystectomy. Soft, non-distended. Diffuse tenderness to palpation however most prominent with guarding in the LUQ and LLQ. No rebound. Normoactive bowel sounds.  MSK: Moving all extremities equally. Normal bulk and tone.  Skin: Warm and dry.  Neuro: Alert and oriented. Moving in bed by self using all four extremities. Psych: Conversational. Normal mood and affect.   Assessment/Plan:  Principal Problem:   Interstitial pancreatitis (HCC) Active Problems:   HTN (hypertension)   Emphysema lung (HCC)   Anticoagulated on Coumadin    Chronic diastolic CHF (congestive heart failure), NYHA class 2 (HCC)  Tony Zhang is a 78 y.o. 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   Abdominal pain  Acute interstitial pancreatitis  Leukocytosis  Hx gallstone pancreatitis s/p cholecystectomy (01/2024) CT A/P with evidence of acute interstitial pancreatitis. Lipase elevated to 293. Prior hx of gallstone pancreatitis after onset of abdominal pain following his TAVR in 10/2023. He was not evaluated by GI at that time and instead followed up with general surgery outpatient to plan an elective cholecystectomy which was completed in 01/2024. No risk factors for current episode including alcohol use, hypertriglyceridemia, recent ERCP or contributing medications. He did not have evidence of choledocholithiasis during his episode of pancreatitis earlier  this year, however it is possible this could contribute to current presentation. Will obtain MRCP today and continue to treat the patient's pain. He has remained hemodynamically stable and is tolerating a regular  diet.  - Follow up MRCP  Consider GI consult  - Pain control: Tylenol  1000 mg scheduled, Hydromorphone  1 mg q3h PRN, Ketorolac  PRN - Continue to follow blood cultures - Regular diet   Dyspnea  Recent pneumonia  Hx severe emphysema  CT chest showed subpleural scarring consistent with emphysema but did not show any pleural effusion or consolidation. Patient remains without clinical signs of pneumonia and he has been satting appropriately on room air since yesterday afternoon. Antibiotics were discontinued yesterday due to low suspicion for pneumonia and he has remained afebrile now with completely clear lungs on exam.  - Continue home Symbicort , albuterol   - OT/PT  Hx of DVT/PE  Long-term anticoagulation, supratherapeutic  On chronic Warfarin for > 20 years. INR improved somewhat to 3.5 from 4.1 yesterday since holding Warfarin. Suspect supratherapeutic level is 2/2 several recent courses of antibiotics as well as potentially taking more Warfarin than prescribed give difficulties with medication management. Will consider utility of switching to DOAC on discharge.  - Continue to HOLD home Warfarin - Anticoagulation per pharmacy   CAD  Hx NSTEMI  S/p DES-LAD (05/2022)  High-grade stenosis of the RCA Patient remains asymptomatic without chest pain.  - Continue home atorvastatin   HFpEF  Aortic stenosis s/p TAVR (10/2023) Echo today 6/26 demonstrated LVEF 60-65%, unchanged from prior echo in 11/2023. No wall motion abnormalities or valvular disease. Clinically appears euvolemic. - Continue home Jardiance   - HOLD home Lasix   Hypertension Blood pressure remains elevated here - 160-180s/80-100s. Suspect acute pain and inflammation are contributing to current high BP. Will restart other home meds.  - Continue metoprolol  succinate 100 mg - Restart spironolactone  25 mg daily  - Restart losartan  50 mg daily   Hyperlipidemia Well-controlled with most recent LDL 31.  - On home Repatha  -  Continue home fenofibric acid     PAD  Chronic occlusion of the infrarenal abdominal aorta  Longstanding history of occlusion of the distal abdominal aorta with reconstitution at the level of the common femoral arteries via inferior epigastric arteries.  - HOLD home Cilostazol   - Restart home gabapentin  600 mg BID   Polypharmacy At risk medication management At risk medications with questionable management by patient. Will need reconciliation prior to discharge  Diet: Regular  IVF: N/A VTE: SCDs; supratherapeutic INR Code: FULL   LOS: 1 day   Trudy Mliss Dragon, MD 04/13/2024, 6:02 PM

## 2024-04-12 NOTE — Progress Notes (Signed)
 OT Cancellation Note  Patient Details Name: Tony Zhang MRN: [id number] DOB: [birth year]   Cancelled Treatment:    Reason Eval/Treat Not Completed: Pain limiting ability to participate (pt c/o 9/10 abdominal pain despite having pain medications. Pt reports he would not like to engage in therapy at this time but may be open to it later once pain is controlled. OT to follow-up with pt as able.)  04/12/2024  AB, OTR/L  Acute Rehabilitation Services  Office: [phone]   [name] 04/12/2024, 11:14 AM

## 2024-04-12 NOTE — Telephone Encounter (Signed)

## 2024-04-12 NOTE — Progress Notes (Signed)
*  PRELIMINARY RESULTS* Echocardiogram 2D Echocardiogram has been performed.  Tony Zhang 04/12/2024, 10:12 AM

## 2024-04-12 NOTE — Plan of Care (Addendum)
 Pt had an ongoing abdominal pain throughout the night, covered with PRN meds. Had an elevated blood pressure at 0400,pain meds were again given and repeated BP after 30 mins. Relayed to MD [facility], aware. Problem: Health Behavior/Discharge Planning: Goal: Ability to manage health-related needs will improve Outcome: Progressing   Problem: Clinical Measurements: Goal: Will remain free from infection Outcome: Progressing   Problem: Activity: Goal: Risk for activity intolerance will decrease Outcome: Progressing   Problem: Coping: Goal: Level of anxiety will decrease Outcome: Progressing   Problem: Pain Managment: Goal: General experience of comfort will improve and/or be controlled Outcome: Progressing   Problem: Safety: Goal: Ability to remain free from injury will improve Outcome: Progressing   Problem: Skin Integrity: Goal: Risk for impaired skin integrity will decrease Outcome: Progressing

## 2024-04-12 NOTE — Plan of Care (Signed)

## 2024-04-13 ENCOUNTER — Telehealth (HOSPITAL_COMMUNITY): Payer: Self-pay | Admitting: Pharmacy Technician

## 2024-04-13 ENCOUNTER — Inpatient Hospital Stay (HOSPITAL_COMMUNITY)

## 2024-04-13 ENCOUNTER — Other Ambulatory Visit (HOSPITAL_COMMUNITY): Payer: Self-pay

## 2024-04-13 LAB — PROTIME-INR
INR: 2.5 — ABNORMAL HIGH (ref 0.8–1.2)
Prothrombin Time: 28 s — ABNORMAL HIGH (ref 11.4–15.2)

## 2024-04-13 MED ORDER — OXYCODONE HCL 5 MG PO TABS: 10.0000 mg | ORAL_TABLET | Freq: Four times a day (QID) | ORAL | Status: DC

## 2024-04-13 MED ORDER — HYDROMORPHONE HCL 2 MG PO TABS
1.0000 mg | ORAL_TABLET | ORAL | Status: DC | PRN
  Administered 2024-04-13 (×2): 1 mg via ORAL
  Filled 2024-04-13 (×2): qty 1

## 2024-04-13 MED ORDER — GADOBUTROL 1 MMOL/ML IV SOLN
10.0000 mL | Freq: Once | INTRAVENOUS | Status: AC | PRN
  Administered 2024-04-13: 10 mL via INTRAVENOUS

## 2024-04-13 MED ORDER — LIDOCAINE 5 % EX PTCH
1.0000 | MEDICATED_PATCH | CUTANEOUS | Status: DC
  Administered 2024-04-13 – 2024-04-15 (×3): 1 via TRANSDERMAL
  Filled 2024-04-13 (×3): qty 1

## 2024-04-13 MED ORDER — OXYCODONE HCL 5 MG PO TABS
5.0000 mg | ORAL_TABLET | Freq: Four times a day (QID) | ORAL | Status: DC
  Administered 2024-04-13: 5 mg via ORAL
  Filled 2024-04-13: qty 1

## 2024-04-13 NOTE — Progress Notes (Signed)
  Progress Note   Date: 04/13/2024  Patient Name: Tony Zhang        MRN#: [id number]  Review the patient's clinical findings supports the diagnosis of:   CKD Stage 3a

## 2024-04-13 NOTE — Plan of Care (Signed)

## 2024-04-13 NOTE — Telephone Encounter (Signed)
 Patient Product/process development scientist completed.    The patient is insured through [facility] Patient has Medicare and is not eligible for a copay card, but may be able to apply for patient assistance or Medicare RX Payment Plan (Patient Must reach out to their plan, if eligible for payment plan), if available.    Ran test claim for Xarelto 20 mg and the current 30 day co-pay is $0.00.   This test claim was processed through [facility]- copay amounts may vary at other pharmacies due to pharmacy/plan contracts, or as the patient moves through the different stages of their insurance plan.     Reyes Sharps, CPHT Pharmacy Technician III Certified Patient Advocate [facility] Patient Advocate Team Direct Number: [phone]  Fax: [phone]

## 2024-04-13 NOTE — Plan of Care (Signed)
  Problem: Education: Goal: Knowledge of General Education information will improve Description: Including pain rating scale, medication(s)/side effects and non-pharmacologic comfort measures 04/13/2024 0604 by Gwynneth Richerd LABOR, RN Outcome: Progressing 04/13/2024 0604 by Gwynneth Richerd LABOR, RN Outcome: Progressing   Problem: Health Behavior/Discharge Planning: Goal: Ability to manage health-related needs will improve Outcome: Progressing   Problem: Clinical Measurements: Goal: Will remain free from infection Outcome: Progressing Goal: Diagnostic test results will improve Outcome: Progressing

## 2024-04-13 NOTE — Progress Notes (Addendum)
 Subjective:  Patient Summary: Tony Zhang is a 78 y.o. 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   Overnight Events/Interim History: No acute events overnight. Received Tylenol  1000 mg x 2 and Dilaudid  1 mg x 3 for pain over the last 24 hours. He experienced nausea last night after eating chicken but was subsequently able to eat a cookie. On evaluation this morning, not experiencing nausea and reports that he has an appetite. His abdominal pain remains severe and unchanged. It wakes him from sleep but then he is able to rest after getting a dose of Dilaudid . He has not had a bowel movement since Tuesday (3 days ago). Denies shortness of breath, chest pain, or palpitations.   Objective:  Vital signs in last 24 hours: Vitals:   04/12/24 2341 04/13/24 0733 04/13/24 1234 04/13/24 1518  BP: (!) 139/47 (!) 101/46 97/61 (!) 84/62  Pulse: 84 83 79 74  Resp: 18 19 18 18   Temp: 97.8 F (36.6 C) 97.7 F (36.5 C) 97.6 F (36.4 C) 98.4 F (36.9 C)  TempSrc: Oral Oral Oral Oral  SpO2: 91% 92% 93% 90%  Weight:      Height:       Weight change:   Intake/Output Summary (Last 24 hours) at 04/13/2024 1810 Last data filed at 04/13/2024 0851 Gross per 24 hour  Intake 480 ml  Output 850 ml  Net -370 ml   Physical Exam -  General: Uncomfortable-appearing, laying in bed in NAD.  HEENT: Mucous membranes moist. No conjunctival erythema. No rhinorrhea.  Cardio: Regular rate, regular rhythm. No JVD. No LE edema. Pulm: No increased work of breathing on room air. Speaking in full sentences. Limited lung exam 2/2 severe pain but RLL/RML clear to  auscultation.  Abd: Well-healed 4 scars from prior cholecystectomy. Soft, non-distended. Significant tenderness to light palpation however most prominent with guarding in the LUQ and LLQ. No rebound. Normoactive bowel sounds.  MSK: Moving all extremities equally. Normal bulk and tone.  Skin: Warm and dry.  Neuro: Alert and oriented. Moving in bed by self using all four extremities. Psych: Conversational. Normal mood and affect.   Assessment/Plan:  Principal Problem:   Interstitial pancreatitis (HCC) Active Problems:   HTN (hypertension)   Emphysema lung (HCC)   Anticoagulated on Coumadin    Chronic diastolic CHF (congestive heart failure), NYHA class 2 (HCC)  Tony Zhang is a 78 y.o. 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   Abdominal pain  Acute interstitial pancreatitis  Leukocytosis  Hx gallstone pancreatitis s/p cholecystectomy (01/2024) CT A/P with evidence of acute interstitial pancreatitis. Lipase elevated to 293. Prior hx of gallstone pancreatitis after onset of abdominal pain following his TAVR in 10/2023. He subsequently underwent cholecystectomy in 01/2024. No risk factors for current episode including alcohol use, hypertriglyceridemia, recent ERCP or contributing medications. MRCP obtained this morning did not show acute pancreatic fluid collection, biliary dilitation, or choledocholithiasis.  Findings are consistent with chronic sequelae of acute pancreatitis from 10/2023, thus suspect this presentation is acute on chronic pancreatitis. Leukocytosis has resolved and he has remained afebrile and hemodynamically stable  throughout admission. Blood cultures negative at 24h. Will advance diet as tolerated given nausea yesterday and continue to treat symptomatically.  - Pain control: Tylenol  1000 mg QID, oxycodone  5 mg q6h, Ketorolac  q8h PRN Breakthrough pain: Hydromorphone  1 mg q3h PRN - Zofran  4 mg q8h PRN - Continue to follow blood cultures - Advance diet as tolerated   Dyspnea  Recent pneumonia  Hx severe emphysema  CT chest showed subpleural scarring consistent with emphysema but did not show any pleural effusion or consolidation. Patient remains without clinical signs of pneumonia and he has been satting appropriately on room air. Intermittent shortness of breath appears to be due to severe pain 2/2 acute pancreatitis as described above.  - Continue home Symbicort , albuterol   - OT/PT  Hx of DVT/PE  Long-term anticoagulation, supratherapeutic  On chronic Warfarin for > 20 years. INR supratherapeutic at 4.4 on admission, now within therapeutic range at 2.5. Pharmacy team to touch base with patient this afternoon about switching to Eliquis vs Xarelto on discharge.  - Follow up Pharmacy recs   Musculoskeletal back pain Chronic problem for patient. Unchanged during this admission per patient report.  - Lidocaine  patches - Apply heat   CAD  Hx NSTEMI  S/p DES-LAD (05/2022)  High-grade stenosis of the RCA Patient remains asymptomatic without chest pain.  - Continue home atorvastatin   HFpEF  Aortic stenosis s/p TAVR (10/2023) Echo 6/26 demonstrated LVEF 60-65%, unchanged from prior echo in 11/2023. No wall motion abnormalities or valvular disease. Clinically remains euvolemic. - Continue home Jardiance   - HOLD home Lasix   Hypertension Blood pressure has improved since initiating spironolactone  and losartan  yesterday. Suspect acute pain and inflammation are contributing to intermittent elevations in BP.  - Continue metoprolol  succinate 100 mg - Continue spironolactone  25 mg daily  - Continue  losartan  50 mg daily   Hyperlipidemia Well-controlled with most recent LDL 31.  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  - HOLD home Cilostazol   - Restart home gabapentin  600 mg BID   Polypharmacy At risk medication management At risk medications with questionable management by patient. Will need reconciliation prior to discharge.   Diet: Advance as tolerated  IVF: N/A VTE: SCDs; therapeutic INR currently  Code: FULL   LOS: 1 day   Trudy Mliss Dragon, MD 04/13/2024, 6:10 PM

## 2024-04-14 ENCOUNTER — Other Ambulatory Visit (HOSPITAL_COMMUNITY): Payer: Self-pay

## 2024-04-14 DIAGNOSIS — K861 Other chronic pancreatitis: Secondary | ICD-10-CM

## 2024-04-14 LAB — COMPREHENSIVE METABOLIC PANEL WITH GFR
ALT: 12 U/L (ref 0–44)
AST: 22 U/L (ref 15–41)
Albumin: 2.5 g/dL — ABNORMAL LOW (ref 3.5–5.0)
Alkaline Phosphatase: 42 U/L (ref 38–126)
Anion gap: 12 (ref 5–15)
BUN: 36 mg/dL — ABNORMAL HIGH (ref 8–23)
CO2: 18 mmol/L — ABNORMAL LOW (ref 22–32)
Calcium: 8 mg/dL — ABNORMAL LOW (ref 8.9–10.3)
Chloride: 104 mmol/L (ref 98–111)
Creatinine, Ser: 2.32 mg/dL — ABNORMAL HIGH (ref 0.61–1.24)
GFR, Estimated: 28 mL/min — ABNORMAL LOW (ref >60)
Glucose, Bld: 105 mg/dL — ABNORMAL HIGH (ref 70–99)
Potassium: 4.2 mmol/L (ref 3.5–5.1)
Sodium: 134 mmol/L — ABNORMAL LOW (ref 135–145)
Total Bilirubin: 1 mg/dL (ref 0.0–1.2)
Total Protein: 5.8 g/dL — ABNORMAL LOW (ref 6.5–8.1)

## 2024-04-14 LAB — CBC
HCT: 47 % (ref 39.0–52.0)
Hemoglobin: 15.3 g/dL (ref 13.0–17.0)
MCH: 30.2 pg (ref 26.0–34.0)
MCHC: 32.6 g/dL (ref 30.0–36.0)
MCV: 92.9 fL (ref 80.0–100.0)
Platelets: 233 10*3/uL (ref 150–400)
RBC: 5.06 MIL/uL (ref 4.22–5.81)
RDW: 15.6 % — ABNORMAL HIGH (ref 11.5–15.5)
WBC: 11.1 10*3/uL — ABNORMAL HIGH (ref 4.0–10.5)
nRBC: 0 % (ref 0.0–0.2)

## 2024-04-14 LAB — BASIC METABOLIC PANEL WITH GFR
Anion gap: 14 (ref 5–15)
BUN: 40 mg/dL — ABNORMAL HIGH (ref 8–23)
CO2: 17 mmol/L — ABNORMAL LOW (ref 22–32)
Calcium: 7.7 mg/dL — ABNORMAL LOW (ref 8.9–10.3)
Chloride: 102 mmol/L (ref 98–111)
Creatinine, Ser: 2.45 mg/dL — ABNORMAL HIGH (ref 0.61–1.24)
GFR, Estimated: 26 mL/min — ABNORMAL LOW (ref >60)
Glucose, Bld: 145 mg/dL — ABNORMAL HIGH (ref 70–99)
Potassium: 4.1 mmol/L (ref 3.5–5.1)
Sodium: 133 mmol/L — ABNORMAL LOW (ref 135–145)

## 2024-04-14 LAB — PROTIME-INR
INR: 2.1 — ABNORMAL HIGH (ref 0.8–1.2)
Prothrombin Time: 24.9 s — ABNORMAL HIGH (ref 11.4–15.2)

## 2024-04-14 MED ORDER — ACETAMINOPHEN 500 MG PO TABS
1000.0000 mg | ORAL_TABLET | Freq: Four times a day (QID) | ORAL | 0 refills | Status: AC
  Filled 2024-04-14: qty 56, 7d supply, fill #0

## 2024-04-14 MED ORDER — SPIRONOLACTONE 25 MG PO TABS: 25.0000 mg | ORAL_TABLET | Freq: Every day | ORAL | Status: DC

## 2024-04-14 MED ORDER — LOSARTAN POTASSIUM 50 MG PO TABS: 50.0000 mg | ORAL_TABLET | Freq: Every day | ORAL | Status: DC

## 2024-04-14 MED ORDER — WARFARIN SODIUM 4 MG PO TABS
4.0000 mg | ORAL_TABLET | Freq: Once | ORAL | Status: AC
  Administered 2024-04-14: 4 mg via ORAL
  Filled 2024-04-14 (×2): qty 1

## 2024-04-14 MED ORDER — LACTATED RINGERS IV BOLUS
1000.0000 mL | Freq: Once | INTRAVENOUS | Status: AC
  Administered 2024-04-14: 1000 mL via INTRAVENOUS

## 2024-04-14 MED ORDER — WARFARIN - PHARMACIST DOSING INPATIENT: Freq: Every day | Status: DC

## 2024-04-14 MED ORDER — LIDOCAINE 5 % EX PTCH
1.0000 | MEDICATED_PATCH | CUTANEOUS | 0 refills | Status: DC
  Filled 2024-04-14: qty 30, 30d supply, fill #0

## 2024-04-14 MED ORDER — SENNOSIDES-DOCUSATE SODIUM 8.6-50 MG PO TABS
2.0000 | ORAL_TABLET | Freq: Every day | ORAL | 0 refills | Status: AC
  Filled 2024-04-14: qty 14, 7d supply, fill #0

## 2024-04-14 MED ORDER — HYDROMORPHONE HCL 2 MG PO TABS
1.0000 mg | ORAL_TABLET | ORAL | Status: DC | PRN
  Administered 2024-04-15 – 2024-04-16 (×6): 1 mg via ORAL
  Filled 2024-04-14 (×7): qty 1

## 2024-04-14 MED ORDER — HYDROMORPHONE HCL 2 MG PO TABS
1.0000 mg | ORAL_TABLET | ORAL | 0 refills | Status: AC | PRN
  Filled 2024-04-14: qty 15, 5d supply, fill #0

## 2024-04-14 MED ORDER — SENNOSIDES-DOCUSATE SODIUM 8.6-50 MG PO TABS
2.0000 | ORAL_TABLET | Freq: Every day | ORAL | Status: DC
  Administered 2024-04-14 – 2024-04-16 (×3): 2 via ORAL
  Filled 2024-04-14 (×3): qty 2

## 2024-04-14 NOTE — Plan of Care (Signed)
 Pt complaint of abdominal pain once, prn meds given. Pt refused Scd's and Aquathermia pad, verbalized to use it in the morning. Problem: Health Behavior/Discharge Planning: Goal: Ability to manage health-related needs will improve Outcome: Progressing   Problem: Clinical Measurements: Goal: Will remain free from infection Outcome: Progressing   Problem: Activity: Goal: Risk for activity intolerance will decrease Outcome: Progressing   Problem: Clinical Measurements: Goal: Cardiovascular complication will be avoided Outcome: Progressing   Problem: Nutrition: Goal: Adequate nutrition will be maintained Outcome: Progressing   Problem: Pain Managment: Goal: General experience of comfort will improve and/or be controlled Outcome: Progressing   Problem: Safety: Goal: Ability to remain free from injury will improve Outcome: Progressing

## 2024-04-14 NOTE — Progress Notes (Signed)
 Subjective:  Patient Summary: Tony Zhang is a 78 y.o. 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  Pain is improving with tylenol  and oral dilaudid . Most of pain is when he rolls to the L side. He would like to use less oral dilaudid . Has tolerated PO intake without nausea or vomiting. Re-stated his desire to continue warfarin, but would like to change PCP to [place].   Objective:  Vital signs in last 24 hours: Vitals:   04/14/24 0346 04/14/24 0804 04/14/24 0805 04/14/24 0837  BP: (!) 120/48  (!) 88/30 (!) 122/37  Pulse: 76  73 75  Resp: 16  16 16   Temp: 97.6 F (36.4 C)  97.7 F (36.5 C)   TempSrc: Oral  Oral   SpO2: 90% 90% (!) 89% 92%  Weight:      Height:       Weight change:   Intake/Output Summary (Last 24 hours) at 04/14/2024 1011 Last data filed at 04/13/2024 2020 Gross per 24 hour  Intake 240 ml  Output 100 ml  Net 140 ml   Physical Exam -  General: Well appearing man in NAD HEENT: Moist mucus membranes Cardio: RRR, systolic murmur Pulm: CTAB, no crackles or wheezing Abd: Well-healed 4 scars from prior cholecystectomy. Normoactive bowel sounds. Mild tenderness to deep palpation over the LUQ, improvement in the LLQ MSK: Moving all extremities equally. Normal bulk and tone.  Skin: Warm and dry.  Neuro:Alert and oriented x 4. Conversant Psych: Pleasant mood and affect Assessment/Plan:  Principal Problem:   Interstitial pancreatitis (HCC) Active Problems:   Anticoagulated on Coumadin    Chronic diastolic CHF (congestive heart failure), NYHA class 2 (HCC)   HTN (hypertension)    Emphysema lung (HCC)   Acute interstitial pancreatitis  Leukocytosis  Hx gallstone pancreatitis s/p cholecystectomy (01/2024) No other etiologies; likely recurrent with recent history of the same. MRCP without pancreatic fluid collection, biliary dilatation, or choledocholithiasis. Blood cultures negative at 72h. Tolerating diet and improvement in pain control.  - Pain control: Tylenol  1000 mg QID, hydromorphone  1 mg q4h PRN - Advance diet as tolerated   AKI Suspect pre-renal. Patient restarted on antihypertensives per Cardiology notes given inability to recall names of medications. His Bps have been low normal. Suspect he was not taking medications as prescribed. BUN 21>36, and Cr 1.06>2.32 - Hold ARB and Spironolactone  - 1L bolus today - Follow up BMP in the AM  Hx of DVT/PE  Long-term anticoagulation, supratherapeutic  On chronic Warfarin for > 20 years. INR supratherapeutic at 4.4 on admission Today 2.1. Discussed with pharmacy; will receive 4 mg today  - d/c regimen could be 2mg /d except for take 4mg  on Sat then f/u as outpatient per Pharmacy - Will attempt to switch to DOAC as outpatient given reluctance to change while inpatient  Hx severe emphysema  CT chest showed subpleural scarring consistent with emphysema but did not show any pleural effusion or consolidation. On RA.  - Continue home Symbicort , albuterol    Musculoskeletal back pain - Lidocaine  patches - Apply heat   CAD  Hx NSTEMI  S/p DES-LAD (05/2022)  High-grade stenosis of the RCA Patient remains asymptomatic without chest pain.  -  Continue home atorvastatin   HFpEF  Aortic stenosis s/p TAVR (10/2023) Echo 6/26 demonstrated LVEF 60-65%, unchanged from prior echo in 11/2023. No wall motion abnormalities or valvular disease. Euvolemic today - Continue home Jardiance   - HOLD home Lasix , ARB, & spironolactone   Hypertension Blood pressure has improved since initiating spironolactone  and losartan  yesterday.  Suspect acute pain and inflammation are contributing to intermittent elevations in BP.  - Continue metoprolol  succinate 100 mg - Continue spironolactone  25 mg daily  - Continue losartan  50 mg daily   Hyperlipidemia Well-controlled with most recent LDL 31.  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  - HOLD home Cilostazol   - Continue home gabapentin  600 mg BID   Polypharmacy At risk medication management Dispo Discussed need to compare medications at home with discharge list.  - Patient to establish care at [facility] upon discharge  Diet: Regular IVF: N/A VTE: Warfarin; pharmacy monitoring INR Code: FULL   LOS: 2 days   Elnora Ip, MD 04/14/2024, 10:11 AM

## 2024-04-14 NOTE — Progress Notes (Signed)
 PHARMACY - ANTICOAGULATION CONSULT NOTE  Pharmacy Consult for warfarin Indication: hx DVT/PE  Allergies  Allergen Reactions   Codeine Hives    Patient Measurements: Height: 6' 1 (185.4 cm) Weight: 92.5 kg (204 lb) IBW/kg (Calculated) : 79.9 HEPARIN  DW (KG): 92.5  Vital Signs: Temp: 97.7 F (36.5 C) (06/28 0805) Temp Source: Oral (06/28 0805) BP: 122/37 (06/28 0837) Pulse Rate: 75 (06/28 0837)  Labs: Recent Labs    04/12/24 0450 04/13/24 0443 04/14/24 0531  HGB 15.3  --  15.3  HCT 46.7  --  47.0  PLT 198  --  233  LABPROT 36.5* 28.0* 24.9*  INR 3.5* 2.5* 2.1*  CREATININE 1.06  --  2.32*    Estimated Creatinine Clearance: 29.7 mL/min (A) (by C-G formula based on SCr of 2.32 mg/dL (H)).   Medical History: Past Medical History:  Diagnosis Date   AAA (abdominal aortic aneurysm) (HCC)    Blood dyscrasia    followed by Dr. Cloretta, for clotting issue, has been on Coumadin  for about 20 yrs.     CAD (coronary artery disease)    Chronic kidney disease    COPD (chronic obstructive pulmonary disease) (HCC)    Diabetes mellitus without complication (HCC)    DJD (degenerative joint disease)    Dyslipidemia    Dyspnea    Gout    Malignant hypertension    Peripheral vascular disease (HCC)    Pneumonia 08/18/2013   pt. 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 Wonda and Dr. Lucas   Severe aortic stenosis    Venous thrombosis    Recurrent    Medications:  Scheduled:   acetaminophen   1,000 mg Oral QID   atorvastatin   20 mg Oral Daily   empagliflozin   10 mg Oral Daily   fenofibrate   160 mg Oral Daily   fluticasone  furoate-vilanterol  1 puff Inhalation Daily   gabapentin   600 mg Oral BID   lidocaine   1 patch Transdermal Q24H   metoprolol  succinate  100 mg  Oral Daily   sodium chloride  flush  3 mL Intravenous Q12H   tamsulosin   0.4 mg Oral Q1500    Assessment: 78 yo male with history of DVT/PE on warfarin PTA. He is here for acute interstitial pancreatitis and MRCP 6/27 was negative. Plans are to resume his warfarin -INR= 2.1, CBC stable  Home warfarin dose: 2 mg M-F, 4 mg Sat/Sun   Goal of Therapy:  INR 2-3 Monitor platelets by anticoagulation protocol: Yes   Plan:  -Warfarin 4mg  po today -Would consider discharge on 2mg /day except take 4mg  on Saturday -Daily INR while admitted  Prentice Poisson, PharmD Clinical Pharmacist **Pharmacist phone directory can now be found on [email or web address] ([email or web address]).  Listed under [facility].

## 2024-04-14 NOTE — Plan of Care (Signed)

## 2024-04-15 ENCOUNTER — Telehealth: Payer: Self-pay | Admitting: Cardiology

## 2024-04-15 DIAGNOSIS — N179 Acute kidney failure, unspecified: Secondary | ICD-10-CM

## 2024-04-15 LAB — RENAL FUNCTION PANEL
Albumin: 2.4 g/dL — ABNORMAL LOW (ref 3.5–5.0)
Anion gap: 8 (ref 5–15)
BUN: 47 mg/dL — ABNORMAL HIGH (ref 8–23)
CO2: 22 mmol/L (ref 22–32)
Calcium: 8 mg/dL — ABNORMAL LOW (ref 8.9–10.3)
Chloride: 101 mmol/L (ref 98–111)
Creatinine, Ser: 2.28 mg/dL — ABNORMAL HIGH (ref 0.61–1.24)
GFR, Estimated: 29 mL/min — ABNORMAL LOW (ref >60)
Glucose, Bld: 133 mg/dL — ABNORMAL HIGH (ref 70–99)
Phosphorus: 4.7 mg/dL — ABNORMAL HIGH (ref 2.5–4.6)
Potassium: 4.1 mmol/L (ref 3.5–5.1)
Sodium: 131 mmol/L — ABNORMAL LOW (ref 135–145)

## 2024-04-15 LAB — URINALYSIS, ROUTINE W REFLEX MICROSCOPIC
Bilirubin Urine: NEGATIVE
Glucose, UA: >=500 mg/dL — AB
Hgb urine dipstick: NEGATIVE
Ketones, ur: NEGATIVE mg/dL
Leukocytes,Ua: NEGATIVE
Nitrite: NEGATIVE
Protein, ur: NEGATIVE mg/dL
Specific Gravity, Urine: 1.017 (ref 1.005–1.030)
pH: 5 (ref 5.0–8.0)

## 2024-04-15 LAB — CBC WITH DIFFERENTIAL/PLATELET
Abs Immature Granulocytes: 0.08 10*3/uL — ABNORMAL HIGH (ref 0.00–0.07)
Basophils Absolute: 0 10*3/uL (ref 0.0–0.1)
Basophils Relative: 0 %
Eosinophils Absolute: 0.2 10*3/uL (ref 0.0–0.5)
Eosinophils Relative: 2 %
HCT: 45.3 % (ref 39.0–52.0)
Hemoglobin: 14.7 g/dL (ref 13.0–17.0)
Immature Granulocytes: 1 %
Lymphocytes Relative: 6 %
Lymphs Abs: 0.6 10*3/uL — ABNORMAL LOW (ref 0.7–4.0)
MCH: 30.4 pg (ref 26.0–34.0)
MCHC: 32.5 g/dL (ref 30.0–36.0)
MCV: 93.6 fL (ref 80.0–100.0)
Monocytes Absolute: 0.9 10*3/uL (ref 0.1–1.0)
Monocytes Relative: 10 %
Neutro Abs: 7.6 10*3/uL (ref 1.7–7.7)
Neutrophils Relative %: 81 %
Platelets: 177 10*3/uL (ref 150–400)
RBC: 4.84 MIL/uL (ref 4.22–5.81)
RDW: 15.2 % (ref 11.5–15.5)
WBC: 9.3 10*3/uL (ref 4.0–10.5)
nRBC: 0 % (ref 0.0–0.2)

## 2024-04-15 LAB — PROTIME-INR
INR: 2 — ABNORMAL HIGH (ref 0.8–1.2)
Prothrombin Time: 23.5 s — ABNORMAL HIGH (ref 11.4–15.2)

## 2024-04-15 MED ORDER — WARFARIN SODIUM 2 MG PO TABS
2.0000 mg | ORAL_TABLET | Freq: Once | ORAL | Status: AC
  Administered 2024-04-15: 2 mg via ORAL
  Filled 2024-04-15: qty 1

## 2024-04-15 MED ORDER — GABAPENTIN 300 MG PO CAPS
600.0000 mg | ORAL_CAPSULE | Freq: Every day | ORAL | Status: DC
  Administered 2024-04-15: 600 mg via ORAL
  Filled 2024-04-15: qty 2

## 2024-04-15 MED ORDER — LIDOCAINE 5 % EX PTCH
1.0000 | MEDICATED_PATCH | Freq: Every day | CUTANEOUS | Status: DC
  Administered 2024-04-16: 1 via TRANSDERMAL
  Filled 2024-04-15 (×2): qty 1

## 2024-04-15 MED ORDER — LIDOCAINE 5 % EX PTCH: 1.0000 | MEDICATED_PATCH | CUTANEOUS | Status: DC

## 2024-04-15 MED ORDER — LACTATED RINGERS IV BOLUS
1000.0000 mL | Freq: Once | INTRAVENOUS | Status: AC
  Administered 2024-04-15: 1000 mL via INTRAVENOUS

## 2024-04-15 MED ORDER — LIDOCAINE 5 % EX PTCH
1.0000 | MEDICATED_PATCH | Freq: Every day | CUTANEOUS | Status: DC
  Administered 2024-04-15 – 2024-04-16 (×2): 1 via TRANSDERMAL
  Filled 2024-04-15: qty 1

## 2024-04-15 MED ORDER — LIDOCAINE 5 % EX PTCH: 1.0000 | MEDICATED_PATCH | Freq: Every day | CUTANEOUS | Status: DC

## 2024-04-15 NOTE — Progress Notes (Signed)
 PHARMACY - ANTICOAGULATION CONSULT NOTE  Pharmacy Consult for warfarin Indication: hx DVT/PE  Allergies  Allergen Reactions   Codeine Hives    Patient Measurements: Height: 6' 1 (185.4 cm) Weight: 92.5 kg (204 lb) IBW/kg (Calculated) : 79.9 HEPARIN  DW (KG): 92.5  Vital Signs: Temp: 98.2 F (36.8 C) (06/29 0806) Temp Source: Oral (06/29 0806) BP: 119/56 (06/29 0806) Pulse Rate: 86 (06/29 0806)  Labs: Recent Labs    04/13/24 0443 04/14/24 0531 04/14/24 1206 04/15/24 0724  HGB  --  15.3  --  14.7  HCT  --  47.0  --  45.3  PLT  --  233  --  177  LABPROT 28.0* 24.9*  --  23.5*  INR 2.5* 2.1*  --  2.0*  CREATININE  --  2.32* 2.45*  --     Estimated Creatinine Clearance: 28.1 mL/min (A) (by C-G formula based on SCr of 2.45 mg/dL (H)).   Medical History: Past Medical History:  Diagnosis Date   AAA (abdominal aortic aneurysm) (HCC)    Blood dyscrasia    followed by Dr. Cloretta, for clotting issue, has been on Coumadin  for about 20 yrs.     CAD (coronary artery disease)    Chronic kidney disease    COPD (chronic obstructive pulmonary disease) (HCC)    Diabetes mellitus without complication (HCC)    DJD (degenerative joint disease)    Dyslipidemia    Dyspnea    Gout    Malignant hypertension    Peripheral vascular disease (HCC)    Pneumonia 08/18/2013   pt. 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 Wonda and Dr. Lucas   Severe aortic stenosis    Venous thrombosis    Recurrent    Medications:  Scheduled:   acetaminophen   1,000 mg Oral QID   atorvastatin   20 mg Oral Daily   empagliflozin   10 mg Oral Daily   fenofibrate   160 mg Oral Daily   fluticasone  furoate-vilanterol  1 puff Inhalation Daily   gabapentin   600 mg Oral BID   lidocaine   1 patch  Transdermal Q24H   metoprolol  succinate  100 mg Oral Daily   senna-docusate  2 tablet Oral Daily   sodium chloride  flush  3 mL Intravenous Q12H   tamsulosin   0.4 mg Oral Q1500   Warfarin - Pharmacist Dosing Inpatient   Does not apply q1600    Assessment: 78 yo male with history of DVT/PE on warfarin PTA. He is here for acute interstitial pancreatitis and MRCP 6/27 was negative. Plans are to resume his warfarin (restarted 6/28) -INR= 2.0, CBC stable  Home warfarin dose: 2 mg M-F, 4 mg Sat/Sun   Goal of Therapy:  INR 2-3 Monitor platelets by anticoagulation protocol: Yes   Plan:  -Warfarin 2mg  po today -Would consider discharge on 2mg /day except take 4mg  on Saturday -Daily INR while admitted  Prentice Poisson, PharmD Clinical Pharmacist **Pharmacist phone directory can now be found on [email or web address] ([email or web address]).  Listed under [facility].

## 2024-04-15 NOTE — Plan of Care (Signed)
   Problem: Health Behavior/Discharge Planning: Goal: Ability to manage health-related needs will improve Outcome: Progressing

## 2024-04-15 NOTE — Telephone Encounter (Signed)
 Outpatient service line: New onset atrial fibrillation RVR  I rhythm reporting new onset atrial fibrillation today at approximately 10:40 AM.  Heart rates were around 120 to 158 bpm.  It is unclear whether he is still in this now or total duration as they have no further updates regarding this.  Attempted to call patient to assess symptoms and no answer.  Unable to leave voice message.  Sending message to provider for further follow-up and to triage pool.  Defer to them whether to initiate anticoagulation and rate control strategy.  Triage if Tony Zhang does not respond to this prior to you guys getting this, please call him and assess symptoms, rates and act accordingly.  If concerning symptoms send him to the emergency room.

## 2024-04-15 NOTE — Plan of Care (Signed)

## 2024-04-15 NOTE — Progress Notes (Signed)
 Subjective:  Patient Summary: Tony Zhang is a 77 y.o. 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  Patient is feeling better with improvement in LUQ pain and tolerating PO. However, noted that has been having difficulty and pain with urination. Mild suprapubic tenderness as well. Symptoms not present at admission, new since yesterday.    Objective:  Vital signs in last 24 hours: Vitals:   04/14/24 1943 04/14/24 2341 04/15/24 0350 04/15/24 0806  BP: (!) 101/41 (!) 105/43 (!) 102/59 (!) 119/56  Pulse: 71 79 82 86  Resp: 18 18 18 18   Temp: 97.7 F (36.5 C) 97.6 F (36.4 C) 97.6 F (36.4 C) 98.2 F (36.8 C)  TempSrc: Oral   Oral  SpO2: 91% 91% 90% 92%  Weight:      Height:       Weight change:   Intake/Output Summary (Last 24 hours) at 04/15/2024 1125 Last data filed at 04/15/2024 0800 Gross per 24 hour  Intake 3 ml  Output 350 ml  Net -347 ml   Physical Exam -  General: Well appearing man in NAD HEENT: Moist mucus membranes Cardio:  RRR with systolic murmur, no JVD Pulm:CTAB without wheezing Abd: Well-healed 4 scars from prior cholecystectomy. Mild tenderness to palpation over LUQ. Suprapubic tenderness today. No CVA tenderness Skin: Warm and dry. No LE edema Neuro:Alert and oriented x 4, appropriate thought content Psych: Pleasant mood and affect  Assessment/Plan:  Principal Problem:   Interstitial pancreatitis (HCC) Active Problems:   Anticoagulated on Coumadin    Chronic diastolic CHF (congestive heart failure), NYHA class 2 (HCC)   HTN (hypertension)   Emphysema lung  (HCC)  AKI ?UTI Hx of BPH Suspect pre-renal. S/p 1L LR and stopping offending agents. Minimal improvement today, BUN?Cr >20. No evidece of uretero - nephrolithiasis on CT abdomen or on MRI abdomen during this hospitalization. - 1L IVF bolus today - Hold ARB and Spironolactone  - U/A and bladder US  post void - Follow up BMP in the AM  Acute interstitial pancreatitis  Leukocytosis  Hx gallstone pancreatitis s/p cholecystectomy (01/2024) No other etiologies; likely recurrent with recent history of the same. MRCP without pancreatic fluid collection, biliary dilatation, or choledocholithiasis. Blood cultures negative at 72h. Improving. Once Aki resolves, patient can be discharged home with PO pain control. - Pain control: Tylenol  1000 mg QID, hydromorphone  1 mg q4h PRN  Hx of DVT/PE  Long-term anticoagulation, supratherapeutic  On chronic Warfarin for > 20 years. INR supratherapeutic at 4.4 on admission Today 2.1. Discussed with pharmacy; will receive 4 mg today  - d/c regimen could be 2mg /d except for take 4mg  on Sat then f/u as outpatient per Pharmacy - Will attempt to switch to DOAC as outpatient given reluctance to change while inpatient  Hx severe emphysema  CT chest showed subpleural scarring consistent with emphysema but did not show any pleural effusion or consolidation. On RA, no distress - Continue home Symbicort , albuterol    Musculoskeletal back pain - Lidocaine  patches - Apply heat   CAD  Hx NSTEMI  S/p DES-LAD (05/2022)  High-grade stenosis of the RCA Patient remains asymptomatic without chest pain.  - Continue home atorvastatin   HFpEF  Aortic  stenosis s/p TAVR (10/2023) Echo 6/26 demonstrated LVEF 60-65%, unchanged from prior echo in 11/2023. No wall motion abnormalities or valvular disease. Euvolemic today - Holding Jardiance  6/29 - - Holding home Lasix , ARB, & spironolactone  6/28 -  Hypertension Blood pressure has improved since initiating spironolactone  and  losartan  yesterday. Suspect acute pain and inflammation are contributing to intermittent elevations in BP.  - Continue metoprolol  succinate 100 mg - Holding home Lasix , ARB, & spironolactone  6/28 -  Hyperlipidemia Well-controlled with most recent LDL 31.  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  - HOLD home Cilostazol   - Continue home gabapentin  600 mg BID   Polypharmacy At risk medication management Dispo Discussed need to compare medications at home with discharge list.  - Patient to establish care at [facility] upon discharge  Diet: Regular PT: pending VTE: Warfarin; pharmacy monitoring INR Code: FULL   LOS: 3 days   Elnora Ip, MD 04/15/2024, 11:25 AM

## 2024-04-16 ENCOUNTER — Other Ambulatory Visit (HOSPITAL_COMMUNITY): Payer: Self-pay | Admitting: Student

## 2024-04-16 ENCOUNTER — Other Ambulatory Visit (HOSPITAL_COMMUNITY): Payer: Self-pay

## 2024-04-16 DIAGNOSIS — J189 Pneumonia, unspecified organism: Secondary | ICD-10-CM

## 2024-04-16 DIAGNOSIS — K859 Acute pancreatitis without necrosis or infection, unspecified: Secondary | ICD-10-CM

## 2024-04-16 LAB — CBC WITH DIFFERENTIAL/PLATELET
Abs Immature Granulocytes: 0.12 10*3/uL — ABNORMAL HIGH (ref 0.00–0.07)
Basophils Absolute: 0 10*3/uL (ref 0.0–0.1)
Basophils Relative: 0 %
Eosinophils Absolute: 0.2 10*3/uL (ref 0.0–0.5)
Eosinophils Relative: 3 %
HCT: 44.8 % (ref 39.0–52.0)
Hemoglobin: 14.5 g/dL (ref 13.0–17.0)
Immature Granulocytes: 2 %
Lymphocytes Relative: 7 %
Lymphs Abs: 0.5 10*3/uL — ABNORMAL LOW (ref 0.7–4.0)
MCH: 29.6 pg (ref 26.0–34.0)
MCHC: 32.4 g/dL (ref 30.0–36.0)
MCV: 91.4 fL (ref 80.0–100.0)
Monocytes Absolute: 0.6 10*3/uL (ref 0.1–1.0)
Monocytes Relative: 9 %
Neutro Abs: 5.4 10*3/uL (ref 1.7–7.7)
Neutrophils Relative %: 79 %
Platelets: 232 10*3/uL (ref 150–400)
RBC: 4.9 MIL/uL (ref 4.22–5.81)
RDW: 15.3 % (ref 11.5–15.5)
WBC: 7 10*3/uL (ref 4.0–10.5)
nRBC: 0 % (ref 0.0–0.2)

## 2024-04-16 LAB — BASIC METABOLIC PANEL WITH GFR
Anion gap: 9 (ref 5–15)
BUN: 45 mg/dL — ABNORMAL HIGH (ref 8–23)
CO2: 19 mmol/L — ABNORMAL LOW (ref 22–32)
Calcium: 8 mg/dL — ABNORMAL LOW (ref 8.9–10.3)
Chloride: 104 mmol/L (ref 98–111)
Creatinine, Ser: 1.83 mg/dL — ABNORMAL HIGH (ref 0.61–1.24)
GFR, Estimated: 37 mL/min — ABNORMAL LOW (ref >60)
Glucose, Bld: 103 mg/dL — ABNORMAL HIGH (ref 70–99)
Potassium: 4 mmol/L (ref 3.5–5.1)
Sodium: 132 mmol/L — ABNORMAL LOW (ref 135–145)

## 2024-04-16 LAB — CULTURE, BLOOD (ROUTINE X 2)
Culture: NO GROWTH
Culture: NO GROWTH
Special Requests: ADEQUATE
Special Requests: ADEQUATE

## 2024-04-16 LAB — PROTIME-INR
INR: 2.2 — ABNORMAL HIGH (ref 0.8–1.2)
Prothrombin Time: 25.4 s — ABNORMAL HIGH (ref 11.4–15.2)

## 2024-04-16 MED ORDER — WARFARIN SODIUM 2 MG PO TABS
2.0000 mg | ORAL_TABLET | ORAL | 0 refills | Status: DC
  Filled 2024-04-16 (×2): qty 30, 30d supply, fill #0

## 2024-04-16 MED ORDER — WARFARIN SODIUM 2 MG PO TABS
2.0000 mg | ORAL_TABLET | Freq: Every day | ORAL | Status: DC
  Administered 2024-04-16: 2 mg via ORAL
  Filled 2024-04-16: qty 1

## 2024-04-16 MED ORDER — WARFARIN SODIUM 2 MG PO TABS
2.0000 mg | ORAL_TABLET | Freq: Once | ORAL | Status: DC
  Filled 2024-04-16: qty 1

## 2024-04-16 MED ORDER — METOPROLOL SUCCINATE ER 100 MG PO TB24
50.0000 mg | ORAL_TABLET | Freq: Every day | ORAL | 0 refills | Status: DC
  Filled 2024-04-16: qty 45, 90d supply, fill #0

## 2024-04-16 NOTE — Progress Notes (Addendum)
 PHARMACY - ANTICOAGULATION CONSULT NOTE  Pharmacy Consult for warfarin Indication: hx DVT/PE  Allergies  Allergen Reactions   Codeine Hives    Patient Measurements: Height: 6' 1 (185.4 cm) Weight: 92.5 kg (204 lb) IBW/kg (Calculated) : 79.9 HEPARIN  DW (KG): 92.5  Vital Signs: Temp: 97.6 F (36.4 C) (06/30 0750) Temp Source: Oral (06/30 0750) BP: 137/79 (06/30 0750) Pulse Rate: 78 (06/30 0750)  Labs: Recent Labs    04/14/24 0531 04/14/24 1206 04/15/24 0724 04/16/24 0450  HGB 15.3  --  14.7 14.5  HCT 47.0  --  45.3 44.8  PLT 233  --  177 232  LABPROT 24.9*  --  23.5* 25.4*  INR 2.1*  --  2.0* 2.2*  CREATININE 2.32* 2.45* 2.28* 1.83*    Estimated Creatinine Clearance: 37.6 mL/min (A) (by C-G formula based on SCr of 1.83 mg/dL (H)).  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 He is here for acute interstitial pancreatitis and MRCP 6/27 was negative. Resumed his warfarin 6/28.   INR= 2.2, CBC stable  Home warfarin dose: 2 mg M-F, 4 mg Sat/Sun   Goal of Therapy:  INR 2-3 Monitor platelets by anticoagulation protocol: Yes   Plan:  Give warfarin 2 mg PO x1 dose Check INR daily while on warfarin Continue to monitor H&H and platelets  For discharge planning, can consider continuing warfarin 2mg  PO daily until he is able to follow-up with his [facility] clinic outpatient.   Thank you for allowing pharmacy to be a part of this patient's care.  Shelba Collier, PharmD, BCPS Clinical Pharmacist

## 2024-04-16 NOTE — TOC CM/SW Note (Signed)
 Transition of Care [facility]) - Inpatient Brief Assessment   Patient Details  Name: Tony Zhang MRN: [id number] Date of Birth: Aug 12, 1946  Transition of Care [facility]) CM/SW Contact:    Waddell Barnie Rama, RN Phone Number: 04/16/2024, 3:20 PM   Clinical Narrative: From home alone, has PCP and insurance on file, states has no HH services in place at this time , has walker x 2, has home oxygen as needed at home.  States his lady friend will transport them home at [employer] and she is support system, states gets medications from [facility].  Pta self ambulatory walker/cane.   Transition of Care Asessment: Insurance and Status: Insurance coverage has been reviewed Patient has primary care physician: Yes Home environment has been reviewed: home alone Prior level of function:: ambulatory with walker/cane Prior/Current Home Services: No current home services Social Drivers of Health Review: SDOH reviewed no interventions necessary Readmission risk has been reviewed: Yes Transition of care needs: no transition of care needs at this time

## 2024-04-16 NOTE — Telephone Encounter (Signed)
 This pt is currently admitted to [facility] since 04/11/2024.  Warfarin is on pt's current medication list.

## 2024-04-16 NOTE — Care Management Important Message (Signed)
 Important Message  Patient Details  Name: Tony Zhang MRN: [id number] Date of Birth: 11/25/1945   Important Message Given:  Yes - Medicare IM     Claretta Deed 04/16/2024, 2:30 PM

## 2024-04-16 NOTE — Discharge Instructions (Signed)
   Thank you for allowing us  to be part of your care. You were hospitalized for Interstitial pancreatitis, and your stay was complicated by an Acute kidney injury (AKI). We treated your pancreatitis symptoms with Lidocaine  patch, tylenol , and hydromorphone  for pain. We treated your AKI with fluids and by stopping certain blood pressure medications that are known to cause strain on kidneys.  See the changes in your medications and management of your chronic conditions below:  *For your Pancreatitis  -We have STARTED you on these following medications:  - Hydromorphone  (Dilaudid ) 0.5 mg tablets (1 mg total) by mouth every 4 hours as needed for up to 5 days for severe pain. This is a VERY strong medication. Do not drive while you take this medication. Only take this medication if you need it for pain.   - Lidocaine  5% patch (Lidoderm ). Place 1 patch onto the skin. Remove and discard patch within 12 hours. Use for pain management.   - Acetaminophen  Extra Strength. Take 2 tablets by mouth 4 times daily for 7 days for pain.   *For your Acute Kidney Injury (AKI)  -Please do not take the following medications until you are seen by your primary care provider   - Cilostazol    - Furosemide    - Losartan              - Spironolactone  -We have changed you on these following medications:  - Metoprolol   -- you were previously taking 100 mg once a day for blood pressure, please take 50 mg once a day starting at discharge.    *For your Warfarin Usage  -We have CHANGED you on these following medications: Warfarin 2 mg daily.    -Please see Internal Medicine Residency Team in Clinic on July 8 at 3:15pm.  FOLLOW UP APPOINTMENTS: We arranged for you to follow up with your primary doctor at: Internal Medicine Residency Clinic  Please visit Dr. Nguyen on July 8 at 3:15 pm  Please call your PCP or our clinic if you have any questions or concerns, we may be able to help and keep you from a long and expensive  emergency room wait. Our clinic and after hours phone number is [phone]. The best time to call is Monday through Friday 9 am to 4 pm but there is always someone available 24/7 if you have an emergency. If you need medication refills please notify your pharmacy one week in advance and they will send us  a request.   We are glad you are feeling better,  Sallyanne Primas Internal Medicine Inpatient Teaching Service at [facility]

## 2024-04-16 NOTE — TOC Transition Note (Signed)
 Transition of Care [facility]) - Discharge Note   Patient Details  Name: Tony Zhang MRN: [id number] Date of Birth: [birth year]  Transition of Care [facility]) CM/SW Contact:  Waddell Barnie Rama, RN Phone Number: 04/16/2024, 3:22 PM   Clinical Narrative:    For dc home, he states his lady friend will transport him home today. He has no needs.         Patient Goals and CMS Choice            Discharge Placement                       Discharge Plan and Services Additional resources added to the After Visit Summary for                                       Social Drivers of Health (SDOH) Interventions SDOH Screenings   Food Insecurity: No Food Insecurity (04/11/2024)  Housing: Unknown (04/11/2024)  Transportation Needs: No Transportation Needs (04/11/2024)  Utilities: Not At Risk (04/11/2024)  Alcohol Screen: Low Risk  (06/04/2022)  Financial Resource Strain: Low Risk  (06/04/2022)  Social Connections: Socially Isolated (04/11/2024)  Tobacco Use: Medium Risk (04/10/2024)     Readmission Risk Interventions    04/16/2024    3:16 PM 11/24/2023   12:05 PM  Readmission Risk Prevention Plan  Transportation Screening Complete Complete  Home Care Screening  Complete  Medication Review (RN CM)  Complete  Medication Review (RN Care Manager) Complete   PCP or Specialist appointment within 3-5 days of discharge Complete   HRI or Home Care Consult Complete   Palliative Care Screening Not Applicable   Skilled Nursing Facility Not Applicable

## 2024-04-16 NOTE — Discharge Summary (Signed)
 Name: Tony Zhang MRN: [id number] DOB: 1946/08/24 78 y.o. PCP: Hunter Mickey Browner, PA-C  Date of Admission: 04/11/2024  9:13 AM Date of Discharge: 04/16/2024  4:20 PM Attending Physician: IMTSattending2025/2026: Dr. MICAEL Riis Winfrey  Discharge Diagnosis: 1. Principal Problem:   AKI (acute kidney injury) (HCC) Active Problems:   PAD (peripheral artery disease) (HCC)   Anticoagulated on Coumadin    Chronic diastolic CHF (congestive heart failure), NYHA class 2 (HCC)   Enlarged prostate   CAD (coronary artery disease)   HTN (hypertension)   S/P laparoscopic cholecystectomy   History of transcatheter aortic valve replacement (TAVR)   History of DVT (deep vein thrombosis)   Interstitial pancreatitis (HCC)   Emphysema lung (HCC)   Discharge Medications: Allergies as of 04/16/2024       Reactions   Codeine Hives        Medication List     PAUSE taking these medications    cilostazol  100 MG tablet Wait to take this until your doctor or other care provider tells you to start again. Commonly known as: PLETAL  Take 100 mg by mouth daily.   furosemide  20 MG tablet Wait to take this until your doctor or other care provider tells you to start again. Commonly known as: LASIX  Take 1 tablet (20 mg total) by mouth daily as needed. What changed: when to take this   losartan  50 MG tablet Wait to take this until your doctor or other care provider tells you to start again. Commonly known as: COZAAR  Take 1 tablet (50 mg total) by mouth daily.   ondansetron  8 MG tablet Wait to take this until your doctor or other care provider tells you to start again. Commonly known as: ZOFRAN  Take 8 mg by mouth 2 (two) times daily as needed for vomiting or nausea.   predniSONE  20 MG tablet Wait to take this until your doctor or other care provider tells you to start again. Commonly known as: DELTASONE  Take 20 mg by mouth See admin instructions. Take 3 tablets (60mg ) by mouth daily for 3  days, then take 2 tablets (40mg ) for 2 days, then take 1 tablet (20mg ) for 2 days.   spironolactone  25 MG tablet Wait to take this until your doctor or other care provider tells you to start again. Commonly known as: ALDACTONE  Take 25 mg by mouth daily in the afternoon.       STOP taking these medications    doxycycline 100 MG capsule Commonly known as: VIBRAMYCIN   HYDROcodone -acetaminophen  5-325 MG tablet Commonly known as: NORCO/VICODIN       TAKE these medications    Acetaminophen  Extra Strength 500 MG Tabs Take 2 tablets (1,000 mg total) by mouth 4 (four) times daily for 7 days. What changed:  how much to take when to take this reasons to take this   albuterol  108 (90 Base) MCG/ACT inhaler Commonly known as: VENTOLIN  HFA Inhale 2 puffs into the lungs every 4 (four) hours as needed for wheezing or shortness of breath.   allopurinol  300 MG tablet Commonly known as: ZYLOPRIM  Take 300 mg by mouth daily.   atorvastatin  20 MG tablet Commonly known as: LIPITOR Take 1 tablet (20 mg total) by mouth daily.   budesonide -formoterol  160-4.5 MCG/ACT inhaler Commonly known as: Symbicort  Inhale 2 puffs into the lungs in the morning and at bedtime. What changed: when to take this   cyanocobalamin  1000 MCG tablet Commonly known as: VITAMIN B12 Take 1,000 mcg by mouth See admin instructions. Take  1 tablet ( ) by mouth on the 1st of every month.   empagliflozin  10 MG Tabs tablet Commonly known as: JARDIANCE  Take 1 tablet (10 mg total) by mouth daily. What changed: when to take this   Fenofibric Acid  135 MG Cpdr TAKE 1 TABLET BY MOUTH DAILY What changed:  how much to take when to take this additional instructions   gabapentin  600 MG tablet Commonly known as: NEURONTIN  Take 600 mg by mouth See admin instructions. Take 1 tablet (600mg ) by mouth twice daily, may increase to three times daily if needed for pain, neuropathy.   HYDROmorphone  2 MG tablet Commonly  known as: DILAUDID  Take 0.5 tablets (1 mg total) by mouth every 4 (four) hours as needed for up to 5 days for severe pain (pain score 7-10).   lidocaine  5 % Commonly known as: LIDODERM  Place 1 patch onto the skin daily. Remove & Discard patch within 12 hours or as directed by MD   metoprolol  succinate 100 MG 24 hr tablet Commonly known as: TOPROL -XL Take 0.5 tablets (50 mg total) by mouth daily. What changed: how much to take   pyridOXINE  50 MG tablet Commonly known as: B-6 Take 1 tablet (50 mg total) by mouth daily.   Repatha  SureClick 140 MG/ML Soaj Generic drug: Evolocumab  INJECT 140 MGS SUBCUTANEOUSLY EVERY 14 DAYS What changed: See the new instructions.   Senna-S 8.6-50 MG tablet Generic drug: senna-docusate Take 2 tablets by mouth at bedtime for 7 days.   tamsulosin  0.4 MG Caps capsule Commonly known as: FLOMAX  Take 0.4 mg by mouth daily in the afternoon.   warfarin 2 MG tablet Commonly known as: COUMADIN  Take as directed. If you are unsure how to take this medication, talk to your nurse or doctor. Original instructions: Take 1 tablet (2 mg total) by mouth See admin instructions. Take 1 tablet (2mg ) by mouth once daily at 1600 on Monday-Friday - Take 2 tablets (4mg ) once daily at 1600 on Saturday and Sunday. What changed: how much to take        Disposition and follow-up:   [name] was discharged from [facility] in Good condition.  At the hospital follow up visit please address:  Interstitial Pancreatitis and pain management monitor pain and signs of intraabdominal complication  CMP and CBC  AKI Follow up Cr Resume antihypertensives when Cr normalizes  Anticoagulation for hx of DVT and PE encourage patient to switch to DOAC due to supra therapeutic INRs $0 copay for him  CAD -  Reduced dose of metoprolol  succinate to 50 mg daily New Afib  likely in the setting of acute illness  On anticoagulation Assess for RVR and symptoms at  follow up Medication management  HTN medications held due to AKI, review and restart  S/p TAVR Will need follow up with Cardiology  2.  Labs / imaging needed at time of follow-up: CBC, CMP, Lipase   3.  Pending labs/ test needing follow-up: Blood cultures and urine cultures    Hospital Course by problem list: DAELIN HASTE is a 78 y.o. person living with a history of HTN, Blood dyscrasia (hx of DVT/PE), CKD, COPD, DM, Hx TAVR, who presented with LUQ pain and admitted for interstitial pancreatitis. Stay was complicated by AKI, improving, and Afib with RVR, resolved,  now bring discharge on hospital day 4 with the following pertinent hospital course:  Below is a description of the patient's hospitalization by problem.   Acute interstitial pancreatitis  Leukocytosis  Hx  gallstone pancreatitis s/p cholecystectomy (01/2024 Presented with several weeks of progressively worsening abdominal pain. CT A/P with evidence of acute interstitial pancreatitis. Lipase elevated to 293. No risk factors for current episode including alcohol use, hypertriglyceridemia, recent ERCP or contributing medications. MRCP was obtained due to concern for choledocholithiasis given post cholecystectomy status. MRCP did not show acute pancreatic fluid collection, biliary dilitation, or choledocholithiasis. Findings appeared consistent with chronic sequelae of acute pancreatitis from 10/2023, thus suspect this presentation is acute on chronic pancreatitis. His leukocytosis resolved and his blood cultures remained negative to date. Suspect initial leukocytosis may have also been in the setting of prednisone  prescribed my urgent care a few days before presentation. He remained hemodynamically stable and was able to tolerate a regular diet without nausea or vomiting at the time of discharge.   Acute kidney injury Follow up in clinic. AKI was likely a result of administration of blood pressure medications during hospital stay. When  losartan  was d/c and fluids administered, the Cr trended towards baseline. Continue with metoprolol  100 mg, switched to once a day (previously taking twice a day), for blood pressure control until seen in clinic.   Hx severe emphysema  Initial concern for recurrent pneumonia given shortness of breath and leukocytosis, thus broad spectrum antibiotics were initiated in the ED. CT chest showed subpleural scarring consistent with emphysema but did not show any pleural effusion or consolidation. Patient remained without clinical signs of pneumonia and was quickly weaned to RA, thus antibiotics were discontinued. He remained afebrile. His home Symbicort  and albuterol  were continued. OT/PT recommended no outpatient follow up.   Hx of DVT/PE  Long-term anticoagulation, supratherapeutic  On chronic Warfarin for > 20 years. INR supratherapeutic at 4.4 on admission, after which Warfarin was held with improvement in INR. Suspect elevated INR was due to questionable medication management vs recent antibiotics for pneumonia. INR eventually therapeutic after resuming Warfarin.  CAD  Hx NSTEMI  S/p DES-LAD (05/2022)  High-grade stenosis of the RCA  Patient remained asymptomatic without chest pain. Home atorvastatin  was continued.   HFpEF  Aortic stenosis s/p TAVR (10/2023) Echo 6/26 demonstrated LVEF 60-65%, unchanged from prior echo in 11/2023. No wall motion abnormalities or valvular disease. Clinically remained euvolemic, thus Lasix  was held. Jardiance  was also held in setting of AKI.   Hypertension Holding antihypertensives in setting of AKI. Has been on the low normal blood pressure range without them. Re-start as renal function and BP allow it.   Hyperlipidemia  Home febofibric acid was continued.   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 Home Cilostazol  was held but his gabapentin  was continued throughout admission.   Polypharmacy  At risk medication management Of note, patient reports knowing his medications based on dosages and not names. In depth medication reconciliation was preformed prior to discharge. He will need continued reconciation at follow up  Subjective  Feeling better; has been able to tolerate po intake with minimal nausea and no emesis. Denies chest pain, shortness of breath. Bmx2 and his baseline level of urination frequency. NO suprapubic tenderness. No bleeding. Pain controlled, pain with rolling over to L side.  Discharge Exam:   BP 118/87 (BP Location: Left Arm)   Pulse 81   Temp 97.8 F (36.6 C) (Oral)   Resp 18   Ht 6' 1 (1.854 m)   Wt 92.5 kg  SpO2 94%   BMI 26.91 kg/m  Discharge exam:  General: Pleasant, well-appearing man laying in bed. No acute distress. Head: Normocephalic. Atraumatic. CV: RRR. No murmurs, rubs, or gallops. No LE edema Pulmonary: Lungs CTAB. Normal effort. No wheezing or rales. Abdominal: Soft, mild tenderness to palpation to LUQ and around umbilicus with deep palpation, normal bowel sounds Extremities: Palpable radial and DP pulses. Normal ROM. Skin: Warm and dry. No obvious rash or lesions, especially over the left flank Neuro: A&Ox3. Moves all extremities. Normal sensation. No focal deficit. Psych: Normal mood and affect   Pertinent Labs, Studies, and Procedures:     Latest Ref Rng & Units 04/16/2024    4:50 AM 04/15/2024    7:24 AM 04/14/2024    5:31 AM  CBC  WBC 4.0 - 10.5 K/uL 7.0  9.3  11.1   Hemoglobin 13.0 - 17.0 g/dL 85.4  85.2  84.6   Hematocrit 39.0 - 52.0 % 44.8  45.3  47.0   Platelets 150 - 400 K/uL 232  177  233        Latest Ref Rng & Units 04/16/2024    4:50 AM 04/15/2024    7:24 AM 04/14/2024   12:06 PM  CMP  Glucose 70 - 99 mg/dL 896  866  854   BUN 8 - 23 mg/dL 45  47  40   Creatinine 0.61 - 1.24 mg/dL 8.16  7.71   7.54   Sodium 135 - 145 mmol/L 132  131  133   Potassium 3.5 - 5.1 mmol/L 4.0  4.1  4.1   Chloride 98 - 111 mmol/L 104  101  102   CO2 22 - 32 mmol/L 19  22  17    Calcium  8.9 - 10.3 mg/dL 8.0  8.0  7.7     ECHOCARDIOGRAM COMPLETE Result Date: 04/12/2024    ECHOCARDIOGRAM REPORT   Patient Name:   YAHMIR SOKOLOV Date of Exam: 04/12/2024 Medical Rec #:  [id number]     Height:       73.0 in Accession #:    [id number]    Weight:       204.0 lb Date of Birth:  December 09, 1945     BSA:          2.170 m Patient Age:    77 years      BP:           160/85 mmHg Patient Gender: M             HR:           100 bpm. Exam Location:  Inpatient Procedure: 2D Echo, Cardiac Doppler and Color Doppler (Both Spectral and Color            Flow Doppler were utilized during procedure). Indications:    TAVR  History:        Patient has prior history of Echocardiogram examinations, most                 recent 12/07/2023.                 Aortic Valve: 26 mm Edwards Sapien prosthetic, stented (TAVR)                 valve is present in the aortic position. Procedure Date:                 11/08/2023.  Sonographer:    Eva Lash Referring Phys: [phone] JULIE ANNE TRUDY  Sonographer Comments:  Pt in pain and sensitive to probe pressure. IMPRESSIONS  1. Left ventricular ejection fraction, by estimation, is 60 to 65%. The left ventricle has normal function. The left ventricle has no regional wall motion abnormalities. Left ventricular diastolic parameters are consistent with Grade I diastolic dysfunction (impaired relaxation).  2. Right ventricular systolic function is mildly reduced. The right ventricular size is mildly enlarged.  3. Left atrial size was mildly dilated.  4. The mitral valve is normal in structure. No evidence of mitral valve regurgitation. No evidence of mitral stenosis.  5. The aortic valve has been repaired/replaced. Aortic valve regurgitation is not visualized. No aortic stenosis is present. There is a 26 mm Edwards Sapien  prosthetic (TAVR) valve present in the aortic position. Procedure Date: 11/08/2023. Echo findings  are consistent with normal structure and function of the aortic valve prosthesis. FINDINGS  Left Ventricle: Left ventricular ejection fraction, by estimation, is 60 to 65%. The left ventricle has normal function. The left ventricle has no regional wall motion abnormalities. Definity  contrast agent was given IV to delineate the left ventricular  endocardial borders. The left ventricular internal cavity size was small. There is no left ventricular hypertrophy. Left ventricular diastolic parameters are consistent with Grade I diastolic dysfunction (impaired relaxation). Right Ventricle: The right ventricular size is mildly enlarged. No increase in right ventricular wall thickness. Right ventricular systolic function is mildly reduced. Left Atrium: Left atrial size was mildly dilated. Right Atrium: Right atrial size was normal in size. Pericardium: There is no evidence of pericardial effusion. Mitral Valve: The mitral valve is normal in structure. No evidence of mitral valve regurgitation. No evidence of mitral valve stenosis. Tricuspid Valve: The tricuspid valve is normal in structure. Tricuspid valve regurgitation is mild . No evidence of tricuspid stenosis. Aortic Valve: The aortic valve has been repaired/replaced. Aortic valve regurgitation is not visualized. No aortic stenosis is present. Aortic valve mean gradient measures 5.0 mmHg. Aortic valve peak gradient measures 10.1 mmHg. There is a 26 mm Edwards Sapien prosthetic, stented (TAVR) valve present in the aortic position. Procedure Date: 11/08/2023. Echo findings are consistent with normal structure and function of the aortic valve prosthesis. Pulmonic Valve: The pulmonic valve was normal in structure. Pulmonic valve regurgitation is not visualized. No evidence of pulmonic stenosis. Aorta: The aortic root is normal in size and structure. Venous: The inferior vena  cava was not well visualized. IAS/Shunts: No atrial level shunt detected by color flow Doppler.  LEFT VENTRICLE PLAX 2D LVIDd:         3.70 cm     Diastology LVIDs:         3.00 cm     LV e' medial:  6.09 cm/s LV PW:         2.10 cm     LV e' lateral: 7.18 cm/s LV IVS:        1.30 cm  LV Volumes (MOD) LV vol d, MOD A2C: 83.3 ml LV vol d, MOD A4C: 98.3 ml LV vol s, MOD A2C: 17.8 ml LV vol s, MOD A4C: 21.2 ml LV SV MOD A2C:     65.5 ml LV SV MOD A4C:     98.3 ml LV SV MOD BP:      73.5 ml RIGHT VENTRICLE RV S prime:     10.00 cm/s TAPSE (M-mode): 1.4 cm LEFT ATRIUM             Index LA diam:        4.80 cm 2.21  cm/m LA Vol (A2C):   75.2 ml 34.66 ml/m LA Vol (A4C):   57.8 ml 26.64 ml/m LA Biplane Vol: 65.8 ml 30.33 ml/m  AORTIC VALVE AV Vmax:           159.00 cm/s AV Vmean:          105.000 cm/s AV VTI:            0.275 m AV Peak Grad:      10.1 mmHg AV Mean Grad:      5.0 mmHg LVOT Vmax:         93.00 cm/s LVOT Vmean:        52.100 cm/s LVOT VTI:          0.140 m LVOT/AV VTI ratio: 0.51  AORTA Ao Asc diam: 3.60 cm  SHUNTS Systemic VTI: 0.14 m Morene Brownie Electronically signed by Morene Brownie Signature Date/Time: 04/12/2024/12:09:54 PM    Final    CT CHEST WO CONTRAST Result Date: 04/11/2024 CLINICAL DATA:  Recurrent pneumonia. EXAM: CT CHEST WITHOUT CONTRAST TECHNIQUE: Multidetector CT imaging of the chest was performed following the standard protocol without IV contrast. RADIATION DOSE REDUCTION: This exam was performed according to the departmental dose-optimization program which includes automated exposure control, adjustment of the mA and/or kV according to patient size and/or use of iterative reconstruction technique. COMPARISON:  Chest CT dated 11/07/2023. FINDINGS: Evaluation of this exam is limited in the absence of intravenous contrast. Cardiovascular: There is no cardiomegaly or pericardial effusion. There is 3 vessel coronary vascular calcification. Aortic valve repair. Stable ascending aorta  dilatation of 4 cm. Mediastinum/Nodes: No hilar or mediastinal adenopathy. The esophagus is grossly unremarkable. No mediastinal fluid collection. Lungs/Pleura: Background of emphysema. Bibasilar subpleural scarring. No focal consolidation, pleural effusion, or pneumothorax. The central airways are patent. Upper Abdomen: Inflammatory changes of the pancreas consistent with acute pancreatitis. Correlation with pancreatic enzymes recommended. Cholecystectomy. Musculoskeletal: Degenerative changes of the spine. No acute osseous pathology. IMPRESSION: 1. No acute intrathoracic pathology. 2. Acute pancreatitis. Correlation with pancreatic enzymes recommended. 3.  Emphysema (ICD10-J43.9). Electronically Signed   By: Vanetta Chou M.D.   On: 04/11/2024 15:35   CT ABDOMEN PELVIS W CONTRAST Result Date: 04/11/2024 CLINICAL DATA:  Abdominal pain, acute, nonlocalized.  Sepsis. EXAM: CT ABDOMEN AND PELVIS WITH CONTRAST TECHNIQUE: Multidetector CT imaging of the abdomen and pelvis was performed using the standard protocol following bolus administration of intravenous contrast. RADIATION DOSE REDUCTION: This exam was performed according to the departmental dose-optimization program which includes automated exposure control, adjustment of the mA and/or kV according to patient size and/or use of iterative reconstruction technique. CONTRAST:  75mL OMNIPAQUE  IOHEXOL  350 MG/ML SOLN COMPARISON:  CT scan abdomen and pelvis from 11/21/2023. FINDINGS: Lower chest: There are mild peripheral/subpleural reticulations in the visualized bilateral lungs. No focal mass, consolidation, pleural effusion or pneumothorax. There are patchy areas of linear, plate-like atelectasis and/or scarring throughout bilateral lungs. There is mosaic attenuation of lungs, consistent with heterogeneous air trapping related to small airways disease. Normal heart size. No pericardial effusion. Hepatobiliary: The liver is normal in size. Non-cirrhotic  configuration. No suspicious mass. There is a stable 1.4 x 2.1 cm cyst in the right hepatic lobe, segment 7. No intrahepatic or extrahepatic bile duct dilation. Gallbladder is surgically absent. Pancreas: There is heterogeneous pancreatic attenuation and moderate peripancreatic fat stranding, compatible with acute interstitial pancreatitis. No suspicious mass. Main pancreatic duct is not dilated. Spleen: Within normal limits. No focal lesion. Adrenals/Urinary Tract: Adrenal glands are unremarkable. Bilateral  small/atrophic kidneys again seen. There are multiple simple cyst throughout bilateral kidneys with largest sinus cyst in the left kidney interpolar region measuring up to 2.5 x 3.3 cm. No nephroureterolithiasis or obstructive uropathy on either side. Bladder wall trabeculations noted, suggesting sequela of chronic urinary outflow obstruction. Otherwise unremarkable urinary bladder. Stomach/Bowel: There is a tiny sliding hiatal hernia. No disproportionate dilation of the small or large bowel loops. No evidence of abnormal bowel wall thickening or inflammatory changes. The appendix was not visualized; however there is no acute inflammatory process in the right lower quadrant. Note is made of redundant sigmoid colon. There is moderate amount of stool throughout the colon. Vascular/Lymphatic: No ascites or pneumoperitoneum. No abdominal or pelvic lymphadenopathy, by size criteria. Redemonstration of chronic intraluminal/intramural thrombus in the abdominal aorta. There is complete occlusion of the lower portion of abdominal aorta as well as bilateral common iliac arteries. There is reconstitution from bilateral inferior epigastric arteries. There are marked peripheral atherosclerotic vascular calcifications of the aorta and its major branches. Reproductive: Enlarged prostate. Symmetric seminal vesicles. Other: There is tiny fat containing periumbilical hernia. There are also fat containing bilateral inguinal  hernias. The soft tissues and abdominal wall are otherwise unremarkable. Musculoskeletal: No suspicious osseous lesions. There are moderate multilevel degenerative changes in the visualized spine. IMPRESSION: 1. Findings compatible with acute interstitial pancreatitis. No peripancreatic fluid collection or abscess. No pancreatic necrosis. 2. Multiple other nonacute observations, as described above. Aortic Atherosclerosis (ICD10-I70.0). Electronically Signed   By: Ree Molt M.D.   On: 04/11/2024 12:35   DG Chest Port 1 View Result Date: 04/11/2024 CLINICAL DATA:  shortness of breath EXAM: PORTABLE CHEST - 1 VIEW COMPARISON:  03/09/2024 FINDINGS: Coarse bibasilar opacities left greater than right slightly increased from previous. Heart size and mediastinal contours are within normal limits. Aortic Atherosclerosis (ICD10-170.0). Chronic blunting of left lateral costophrenic angle. Electronic device projects over the left upper lung. Left shoulder DJD. IMPRESSION: Coarse bibasilar opacities left greater than right, slightly increased from previous. Electronically Signed   By: JONETTA Faes M.D.   On: 04/11/2024 11:16     Discharge Instructions: Discharge Instructions     Call MD for:  persistant dizziness or light-headedness   Complete by: As directed    Call MD for:  persistant nausea and vomiting   Complete by: As directed    Call MD for:  redness, tenderness, or signs of infection (pain, swelling, redness, odor or green/yellow discharge around incision site)   Complete by: As directed    Call MD for:  severe uncontrolled pain   Complete by: As directed    Call MD for:  temperature >100.4   Complete by: As directed    Discharge instructions   Complete by: As directed    Thank you for allowing us  to be part of your care. You were hospitalized for Interstitial pancreatitis, and your stay was complicated by an Acute kidney injury (AKI). We treated your pancreatitis symptoms with Lidocaine  patch,  tylenol , and hydromorphone  for pain. We treated your AKI with fluids and by stopping certain blood pressure medications that are known to cause strain on kidneys.  See the changes in your medications and management of your chronic conditions below:  *For your Pancreatitis  -We have STARTED you on these following medications:  - Hydromorphone  (Dilaudid ) 0.5 mg tablets (1 mg total) by mouth every 4 hours as needed for up to 5 days for severe pain. This is a VERY strong medication. Do not drive while you  take this medication. Only take this medication if you need it for pain.   - Lidocaine  5% patch (Lidoderm ). Place 1 patch onto the skin. Remove and discard patch within 12 hours. Use for pain management.   - Acetaminophen  Extra Strength. Take 2 tablets by mouth 4 times daily for 7 days for pain.   *For your Acute Kidney Injury (AKI)  -Please do not take the following medications until you are seen by your primary care provider   - Cilostazol    - Furosemide    - Losartan              - Spironolactone  -We have changed you on these following medications:  - Metoprolol   -- you were previously taking 100 mg once a day for blood pressure, please take 50 mg once a day starting at discharge.    *For your Warfarin Usage  -We have CHANGED you on these following medications: Warfarin 2 mg daily.    -Please see Internal Medicine Residency Team in Clinic on July 8 at 3:15pm.  FOLLOW UP APPOINTMENTS: We arranged for you to follow up with your primary doctor at: [facility] Medicine Residency Clinic  Please visit Dr. Nguyen on July 8 at 3:15 pm  Please call your PCP or our clinic if you have any questions or concerns, we may be able to help and keep you from a long and expensive emergency room wait. Our clinic and after hours phone number is [phone]. The best time to call is Monday through Friday 9 am to 4 pm but there is always someone available 24/7 if you have an emergency. If you need medication  refills please notify your pharmacy one week in advance and they will send us  a request.   We are glad you are feeling better,  Sallyanne Primas Internal Medicine Inpatient Teaching Service at [facility]   Increase activity slowly   Complete by: As directed        Signed: Elnora Ip, MD 04/16/2024, 6:17 PM

## 2024-04-16 NOTE — Progress Notes (Signed)
 DISCHARGE NOTE HOME Tony Zhang to be discharged Home per MD order. Discussed prescriptions and follow up appointments with the patient. Prescriptions given to patient; medication list explained in detail. Patient verbalized understanding.  Skin clean, dry and intact without evidence of skin break down, no evidence of skin tears noted. IV catheter discontinued intact. Site without signs and symptoms of complications. Dressing and pressure applied. Pt denies pain at the site currently. No complaints noted.  Patient free of lines, drains, and wounds.   An After Visit Summary (AVS) was printed and given to the patient. Patient escorted via wheelchair, and discharged home via private auto.  [name], RN

## 2024-04-16 NOTE — Evaluation (Signed)
 Physical Therapy Evaluation Patient Details Name: Tony Zhang MRN: [id number] DOB: 07/02/1946 Today's Date: 04/16/2024  History of Present Illness  78 y.o. male presents to [facility] 04/11/24 from cardiology office 2/2 worsening leukocytosis and reports of abdominal pain. Admitted for acute interstitial pancreatitis. Prior admit 4/28 with LLL CAP. 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  Clinical Impression  Pt admitted with above. Pt presents with general deconditioning, impaired posture, and balance. Pt reports centralized low back pain without radicular symptoms. Reports improvement in pain with lumbar extension. Provided with education and handout on sitting lumbar extension and supine lumbar rotation stretch (pt declined practicing both). Pt ambulating 120 ft with a walker vs no AD at a supervision level. At baseline, pt tends to furniture walk and is independent with ADL's/IADL's. Will continue to follow acutely to promote mobilizing while inpatient. Pt declining follow up PT.        If plan is discharge home, recommend the following: Assistance with cooking/housework;Assist for transportation;Help with stairs or ramp for entrance   Can travel by private vehicle        Equipment Recommendations None recommended by PT  Recommendations for Other Services       Functional Status Assessment Patient has had a recent decline in their functional status and demonstrates the ability to make significant improvements in function in a reasonable and predictable amount of time.     Precautions / Restrictions Precautions Precautions: Fall Restrictions Weight Bearing Restrictions Per Provider Order: No      Mobility  Bed Mobility Overal bed mobility: Modified Independent                  Transfers Overall transfer level: Needs assistance Equipment used: None Transfers: Sit to/from Stand Sit to Stand:  Supervision           General transfer comment: Pt pushing up from furniture to stand    Ambulation/Gait Ambulation/Gait assistance: Supervision Gait Distance (Feet): 120 Feet Assistive device: Rolling walker (2 wheels), None Gait Pattern/deviations: Step-through pattern, Decreased stride length, Trunk flexed Gait velocity: decreased     General Gait Details: Pt with flexed trunk, but able to stand upright. Slow and steady gait. Ambulated in room without AD, reaching for furniture for additional support  Stairs            Wheelchair Mobility     Tilt Bed    Modified Rankin (Stroke Patients Only)       Balance Overall balance assessment: Mild deficits observed, not formally tested                                           Pertinent Vitals/Pain Pain Assessment Pain Assessment: 0-10 Pain Score: 10-Worst pain ever Pain Location: LUQ, central back Pain Descriptors / Indicators: Discomfort, Guarding, Grimacing, Sharp Pain Intervention(s): Limited activity within patient's tolerance, Monitored during session, Premedicated before session    Home Living Family/patient expects to be discharged to:: Private residence Living Arrangements: Alone Available Help at Discharge: Friend(s);Available PRN/intermittently Type of Home: Mobile home Home Access: Stairs to enter Entrance Stairs-Rails: Right;Left Entrance Stairs-Number of Steps: 4   Home Layout: One level Home Equipment: Agricultural consultant (2 wheels);Rollator (4 wheels);Cane - single point;BSC/3in1;Wheelchair - manual Additional Comments: 1L O2 PRN    Prior Function Prior Level of Function : Independent/Modified Independent;Driving  Mobility Comments: furniture walking ADLs Comments: ind with ADLs, iADLs, manages own med. uses BSC as shower seat.     Extremity/Trunk Assessment   Upper Extremity Assessment Upper Extremity Assessment: Overall WFL for tasks assessed    Lower  Extremity Assessment Lower Extremity Assessment: Generalized weakness    Cervical / Trunk Assessment Cervical / Trunk Assessment: Kyphotic  Communication   Communication Communication: No apparent difficulties    Cognition Arousal: Alert Behavior During Therapy: WFL for tasks assessed/performed   PT - Cognitive impairments: No apparent impairments                         Following commands: Intact       Cueing Cueing Techniques: Verbal cues     General Comments      Exercises     Assessment/Plan    PT Assessment Patient needs continued PT services  PT Problem List Decreased strength;Decreased activity tolerance;Decreased balance;Decreased mobility;Pain       PT Treatment Interventions DME instruction;Gait training;Functional mobility training;Stair training;Therapeutic activities;Therapeutic exercise;Balance training;Patient/family education    PT Goals (Current goals can be found in the Care Plan section)  Acute Rehab PT Goals Patient Stated Goal: less pain PT Goal Formulation: With patient Time For Goal Achievement: 04/30/24 Potential to Achieve Goals: Good    Frequency Min 1X/week     Co-evaluation               AM-PAC PT 6 Clicks Mobility  Outcome Measure Help needed turning from your back to your side while in a flat bed without using bedrails?: None Help needed moving from lying on your back to sitting on the side of a flat bed without using bedrails?: None Help needed moving to and from a bed to a chair (including a wheelchair)?: A Little Help needed standing up from a chair using your arms (e.g., wheelchair or bedside chair)?: A Little Help needed to walk in hospital room?: A Little Help needed climbing 3-5 steps with a railing? : A Little 6 Click Score: 20    End of Session   Activity Tolerance: Patient tolerated treatment well Patient left: in bed;with call bell/phone within reach;with bed alarm set;Other (comment) (with  MD's) Nurse Communication: Mobility status PT Visit Diagnosis: Unsteadiness on feet (R26.81);Difficulty in walking, not elsewhere classified (R26.2);Pain Pain - part of body:  (back)    Time: 8954-8888 PT Time Calculation (min) (ACUTE ONLY): 26 min   Charges:   PT Evaluation $PT Eval Low Complexity: 1 Low PT Treatments $Therapeutic Activity: 8-22 mins PT General Charges $$ ACUTE PT VISIT: 1 Visit         Aleck Zhang, PT, DPT Acute Rehabilitation Services Office [phone]   Tony Zhang 04/16/2024, 12:36 PM

## 2024-04-17 DIAGNOSIS — R0902 Hypoxemia: Secondary | ICD-10-CM

## 2024-04-17 DIAGNOSIS — K859 Acute pancreatitis without necrosis or infection, unspecified: Secondary | ICD-10-CM

## 2024-04-24 ENCOUNTER — Ambulatory Visit

## 2024-04-24 VITALS — BP 141/87 | HR 99 | Temp 98.0°F | Ht 73.0 in | Wt 203.2 lb

## 2024-04-24 DIAGNOSIS — Z9889 Other specified postprocedural states: Secondary | ICD-10-CM | POA: Diagnosis not present

## 2024-04-24 DIAGNOSIS — K859 Acute pancreatitis without necrosis or infection, unspecified: Secondary | ICD-10-CM

## 2024-04-24 DIAGNOSIS — N179 Acute kidney failure, unspecified: Secondary | ICD-10-CM | POA: Diagnosis not present

## 2024-04-24 NOTE — Assessment & Plan Note (Addendum)
 Patient had mild AKI while in the hospital, with Cr elevated to 2.43. Cr from day of discharge was 1.83 which is closer to his normal of around 1.5-1.6. We will recheck BMP today to see how his kidney function is doing. He was instructed to hold his anti-hypertensives, spironolactone , furosemide , jardiance , and cilostazol  after discharge from the hospital until we could follow up on his kidney function, but patient reports that he has been taking these since discharge. I will call patient with his lab results.  - check BMP

## 2024-04-24 NOTE — Assessment & Plan Note (Signed)
 Patient reports that he has a history of lumbar surgery many years ago. He is requesting a back brace. Discussed that we do not carry those in the office and that he could get one OTC from a medical supply store or discuss with his PCP for a script. Patient was in agreement with this.

## 2024-04-24 NOTE — Assessment & Plan Note (Signed)
 Patient presents to clinic today as follow up from hospitalization for acute on chronic pancreatitis. Today patient reports pain has fully resolved and he is feeling much better. He states that he is slowly gaining his strength back as well. Given patient's excellent status in terms of abdominal pain, we will defer lipase labs today.

## 2024-04-24 NOTE — Patient Instructions (Addendum)
 Thank you, [name] for allowing us  to provide your care today. Today we discussed the following:  - pancreatitis which you ended up in the hospital for which has improved. Continue to drink plenty of water .  - I will call you to discuss the results of your blood work today.   I have ordered the following labs for you:   Lab Orders         BMP8+Anion Gap       Follow up: with your PCP in about 4-6 weeks for recheck    Remember: Please call your PCP to set up an appointment    Should you have any questions or concerns please call the Internal Medicine Clinic at [phone].     Doyal Miyamoto, MD [facility]

## 2024-04-24 NOTE — Progress Notes (Signed)
 Established Patient Office Visit  Subjective   Patient ID: ISACC TURNEY, male    DOB: 15-Dec-1945  Age: 78 y.o. MRN: [id number]  Chief Complaint  Patient presents with   Hospitalization Follow-up   Mr. 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 He was on our IM hospital service and is following up with us  today because his PCP Dr. Alberteen did not have any openings   Follow up Hospitalization  Patient was admitted to [facility] on 04/11/24 and discharged on 04/16/24. He was treated for acute on chronic pancreatitis and AKI. Treatment for this included IV fluids, pain control, and holding of BP medications. Telephone follow up was done on 04/17/24 He reports fair compliance with treatment. He reports this condition is resolved.  Patient denies any abdominal pain at this time. He reports that since being discharged from the hospital, he is not having any more pain and he is slowly gaining his strength back. He reports that his PCP had sent him for PT/INR which was elevated and so to hold his Warfarin for 2 days. He has been taking all of his home medications including blood pressure medications since discharge from the hospital although he was instructed to hold some of them.   ROS: Denies headaches, dizziness, fever, chills, runny nose, sore throat, vision changes, hearing changes, chest pain, shortness of breath, difficulty breathing, nausea, vomiting, abdominal pain. Denies pain with urination, constipation or diarrhea. No recent falls. He endorses increased urinary frequency for a couple years which does bother him, but he states he has not seen a Insurance underwriter for this nor does he want to.      Objective:     BP (!) 141/87 (BP Location: Right Arm, Patient Position: Sitting, Cuff Size: Normal)   Pulse 99   Temp 98 F (36.7 C) (Oral)   Ht 6' 1 (1.854 m)   Wt  203 lb 3.2 oz (92.2 kg)   SpO2 94%   BMI 26.81 kg/m  BP Readings from Last 3 Encounters:  04/24/24 (!) 141/87  04/16/24 118/87  04/10/24 (!) 168/83   Wt Readings from Last 3 Encounters:  04/24/24 203 lb 3.2 oz (92.2 kg)  04/11/24 204 lb (92.5 kg)  04/10/24 204 lb 3.2 oz (92.6 kg)    Physical Exam:   Constitutional: well-appearing male sitting in exam chair, in no acute distress. Ambulates without use of assistance device  HEENT: normocephalic atraumatic, mucous membranes moist Eyes: conjunctiva non-erythematous Neck: supple Cardiovascular: regular rate and rhythm, bilateral radial pulses 2+, bilateral dorsal pedal pulses 2+, brisk capillary refill bilateral hands  Pulmonary/Chest: normal work of breathing on room air, lungs clear to auscultation bilaterally Abdominal: soft, non-tender, non-distended MSK: normal bulk and tone. Neurological: alert & oriented x 3 Skin: warm and dry Psych: mood calm, behavior normal, thought content normal, judgement normal    No results found for any visits on 04/24/24.  Last CBC Lab Results  Component Value Date   WBC 7.0 04/16/2024   HGB 14.5 04/16/2024   HCT 44.8 04/16/2024   MCV 91.4 04/16/2024   MCH 29.6 04/16/2024   RDW 15.3 04/16/2024   PLT 232 04/16/2024   Last metabolic panel Lab Results  Component Value Date   GLUCOSE 103 (H) 04/16/2024   NA 132 (L) 04/16/2024   K 4.0 04/16/2024   CL 104 04/16/2024  CO2 19 (L) 04/16/2024   BUN 45 (H) 04/16/2024   CREATININE 1.83 (H) 04/16/2024   GFRNONAA 37 (L) 04/16/2024   CALCIUM  8.0 (L) 04/16/2024   PHOS 4.7 (H) 04/15/2024   PROT 5.8 (L) 04/14/2024   ALBUMIN 2.4 (L) 04/15/2024   LABGLOB 3.4 04/10/2024   BILITOT 1.0 04/14/2024   ALKPHOS 42 04/14/2024   AST 22 04/14/2024   ALT 12 04/14/2024   ANIONGAP 9 04/16/2024      The ASCVD Risk score (Arnett DK, et al., 2019) failed to calculate for the following reasons:   Risk score cannot be calculated because patient has a medical  history suggesting prior/existing ASCVD    Assessment & Plan:   Problem List Items Addressed This Visit       Digestive   Acute pancreatitis without infection or necrosis   Patient presents to clinic today as follow up from hospitalization for acute on chronic pancreatitis. Today patient reports pain has fully resolved and he is feeling much better. He states that he is slowly gaining his strength back as well. Given patient's excellent status in terms of abdominal pain, we will defer lipase labs today.         Genitourinary   AKI (acute kidney injury) [facility])   Patient had mild AKI while in the hospital, with Cr elevated to 2.43. Cr from day of discharge was 1.83 which is closer to his normal of around 1.5-1.6. We will recheck BMP today to see how his kidney function is doing. He was instructed to hold his anti-hypertensives, spironolactone , furosemide , jardiance , and cilostazol  after discharge from the hospital until we could follow up on his kidney function, but patient reports that he has been taking these since discharge. I will call patient with his lab results.  - check BMP       Relevant Orders   BMP8+Anion Gap     Other   History of lumbar surgery - Primary   Patient reports that he has a history of lumbar surgery many years ago. He is requesting a back brace. Discussed that we do not carry those in the office and that he could get one OTC from a medical supply store or discuss with his PCP for a script. Patient was in agreement with this.        Return if symptoms worsen or fail to improve. Advised patient to return to his PCP Dr. Alberteen for recheck and to let his office know that he was recently hospitalized so that he can have a sooner appointment than November.    Patient discussed with Dr. Rosan, who also saw and evaluated the patient.  Doyal Miyamoto, MD [facility]  PGY-1  04/24/24, 5:14 PM

## 2024-04-25 ENCOUNTER — Ambulatory Visit: Payer: Self-pay

## 2024-04-25 LAB — BMP8+ANION GAP
Anion Gap: 20 mmol/L — ABNORMAL HIGH (ref 10.0–18.0)
BUN/Creatinine Ratio: 19 (ref 10–24)
BUN: 25 mg/dL (ref 8–27)
CO2: 17 mmol/L — ABNORMAL LOW (ref 20–29)
Calcium: 9 mg/dL (ref 8.6–10.2)
Chloride: 100 mmol/L (ref 96–106)
Creatinine, Ser: 1.35 mg/dL — ABNORMAL HIGH (ref 0.76–1.27)
Glucose: 115 mg/dL — ABNORMAL HIGH (ref 70–99)
Potassium: 4.3 mmol/L (ref 3.5–5.2)
Sodium: 137 mmol/L (ref 134–144)
eGFR: 54 mL/min/1.73 — ABNORMAL LOW (ref >59)

## 2024-04-30 NOTE — Progress Notes (Signed)
Internal Medicine Clinic Attending  I was physically present during the key portions of the resident provided service and participated in the medical decision making of patient's management care. I reviewed pertinent patient test results.  The assessment, diagnosis, and plan were formulated together and I agree with the documentation in the resident's note.  Gust Rung, DO

## 2024-05-02 ENCOUNTER — Ambulatory Visit (HOSPITAL_COMMUNITY)
Admission: RE | Admit: 2024-05-02 | Discharge: 2024-05-02 | Disposition: A | Discharge status: Home / Self Care | Source: Ambulatory Visit | Attending: Internal Medicine | Admitting: Internal Medicine

## 2024-05-02 VITALS — BP 122/64 | HR 87 | Ht 73.0 in | Wt 205.2 lb

## 2024-05-02 DIAGNOSIS — D6869 Other thrombophilia: Secondary | ICD-10-CM | POA: Diagnosis not present

## 2024-05-02 DIAGNOSIS — I4891 Unspecified atrial fibrillation: Secondary | ICD-10-CM | POA: Diagnosis not present

## 2024-05-02 DIAGNOSIS — I480 Paroxysmal atrial fibrillation: Secondary | ICD-10-CM

## 2024-05-02 NOTE — Progress Notes (Signed)
 Primary Care Physician: Hunter Mickey Browner, PA-C Primary Cardiologist: Jerel Balding, MD Electrophysiologist: None     Referring Physician: Elnora Ip, MD     Tony Zhang is a 78 y.o. male with a history of CAD (NSTEMI and DES-LAD), AAA, chronic distal aortic occlusion, DVT, HLD, HTN, chronic diastolic CHF, PAD, AS s/p TAVR, and atrial fibrillation who presents for consultation in the [facility]. Hospital admission 6/25-30/25 for pancreatitis complicated by AKI and Afib with RVR. Cardiac monitor alert on 04/15/24 for new onset Afib; patient on coumadin . Patient is on coumadin  for a CHADS2VASC score of 5.  On evaluation today, he is currently in NSR. He does not drink caffeinated products nor drinks alcohol. He notes having had a couple of sleep studies and does not have sleep apnea. He has been on coumadin  for years. Cardiac monitor results are not back yet. He believes the stress from pancreatitis led to his Afib episodes. He has felt well since hospital discharge.  Today, he denies symptoms of palpitations, chest pain, shortness of breath, orthopnea, PND, lower extremity edema, dizziness, presyncope, syncope, snoring, daytime somnolence, bleeding, or neurologic sequela. The patient is tolerating medications without difficulties and is otherwise without complaint today.   he has a BMI of Body mass index is 27.07 kg/m.SABRA Filed Weights   05/02/24 0817  Weight: 93.1 kg    Current Outpatient Medications  Medication Sig Dispense Refill   albuterol  (VENTOLIN  HFA) 108 (90 Base) MCG/ACT inhaler Inhale 2 puffs into the lungs every 4 (four) hours as needed for wheezing or shortness of breath.     allopurinol  (ZYLOPRIM ) 300 MG tablet Take 300 mg by mouth daily.     atorvastatin  (LIPITOR) 20 MG tablet Take 1 tablet (20 mg total) by mouth daily. 90 tablet 3   budesonide -formoterol  (SYMBICORT ) 160-4.5 MCG/ACT inhaler Inhale 2 puffs into the lungs in the  morning and at bedtime. 1 each 12   Choline Fenofibrate  (FENOFIBRIC ACID ) 135 MG CPDR TAKE 1 TABLET BY MOUTH DAILY 90 capsule 3   cyanocobalamin  (VITAMIN B12) 1000 MCG tablet Take 1,000 mcg by mouth See admin instructions. Take 1 tablet (1000mcg) by mouth on the 1st of every month.     empagliflozin  (JARDIANCE ) 10 MG TABS tablet Take 1 tablet (10 mg total) by mouth daily. 90 tablet 3   gabapentin  (NEURONTIN ) 600 MG tablet Take 600 mg by mouth See admin instructions. Take 1 tablet (600mg ) by mouth twice daily, may increase to three times daily if needed for pain, neuropathy. (Patient taking differently: Take 600 mg by mouth 2 (two) times daily. Take 1 tablet (600mg ) by mouth twice daily, may increase to three times daily if needed for pain, neuropathy.)     HYDROmorphone  (DILAUDID ) 2 MG tablet Take 2 mg by mouth as needed. (Patient taking differently: Take 1 mg by mouth as needed.)     losartan  (COZAAR ) 50 MG tablet Take 1 tablet (50 mg total) by mouth daily. 90 tablet 3   metoprolol  succinate (TOPROL -XL) 100 MG 24 hr tablet Take 0.5 tablets (50 mg total) by mouth daily. (Patient taking differently: Take 100 mg by mouth daily.) 90 tablet 0   ondansetron  (ZOFRAN ) 8 MG tablet Take 8 mg by mouth 2 (two) times daily as needed for vomiting or nausea.     pyridOXINE  (B-6) 50 MG tablet Take 1 tablet (50 mg total) by mouth daily. 90 tablet 0   REPATHA  SURECLICK 140 MG/ML SOAJ INJECT 140 MGS SUBCUTANEOUSLY EVERY  14 DAYS 2 mL 12   tamsulosin  (FLOMAX ) 0.4 MG CAPS capsule Take 0.4 mg by mouth daily in the afternoon.     warfarin (COUMADIN ) 2 MG tablet Take 1 tablet (2 mg total) by mouth See admin instructions. Take 1 tablet (2mg ) by mouth once daily at 1600 on Monday-Friday - Take 2 tablets (4mg ) once daily at 1600 on Saturday and Sunday. 30 tablet 0   [Paused] cilostazol  (PLETAL ) 100 MG tablet Take 100 mg by mouth daily. (Patient not taking: Reported on 05/02/2024)     [Paused] furosemide  (LASIX ) 20 MG tablet Take  1 tablet (20 mg total) by mouth daily as needed. (Patient not taking: Reported on 05/02/2024) 30 tablet 3   lidocaine  (LIDODERM ) 5 % Place 1 patch onto the skin daily. Remove & Discard patch within 12 hours or as directed by MD (Patient not taking: Reported on 05/02/2024) 30 patch 0   [Paused] predniSONE  (DELTASONE ) 20 MG tablet Take 20 mg by mouth See admin instructions. Take 3 tablets (60mg ) by mouth daily for 3 days, then take 2 tablets (40mg ) for 2 days, then take 1 tablet (20mg ) for 2 days. (Patient not taking: Reported on 05/02/2024)     [Paused] spironolactone  (ALDACTONE ) 25 MG tablet Take 25 mg by mouth daily in the afternoon. (Patient not taking: Reported on 05/02/2024)     No current facility-administered medications for this encounter.    Atrial Fibrillation Management history:  Previous antiarrhythmic drugs: none Previous cardioversions: none Previous ablations: none Anticoagulation history: coumadin    ROS- All systems are reviewed and negative except as per the HPI above.  Physical Exam: BP 122/64   Pulse 87   Ht 6' 1 (1.854 m)   Wt 93.1 kg   BMI 27.07 kg/m   GEN: Well nourished, well developed in no acute distress NECK: No JVD; No carotid bruits CARDIAC: Regular rate and rhythm with ectopy, no murmurs, rubs, gallops RESPIRATORY:  Clear to auscultation without rales, wheezing or rhonchi  ABDOMEN: Soft, non-tender, non-distended EXTREMITIES:  No edema; No deformity   EKG today demonstrates  Vent. rate 87 BPM PR interval 178 ms QRS duration 84 ms QT/QTcB 348/418 ms P-R-T axes 67 -79 72 Sinus rhythm with Premature supraventricular complexes Left axis deviation Inferior infarct (cited on or before 15-Apr-2024) Anteroseptal infarct (cited on or before 15-Apr-2024) Abnormal ECG When compared with ECG of 15-Apr-2024 20:27, Sinus rhythm has replaced Atrial fibrillation   Echo 04/12/24 demonstrated  1. Left ventricular ejection fraction, by estimation, is 60 to 65%.  The  left ventricle has normal function. The left ventricle has no regional  wall motion abnormalities. Left ventricular diastolic parameters are  consistent with Grade I diastolic  dysfunction (impaired relaxation).   2. Right ventricular systolic function is mildly reduced. The right  ventricular size is mildly enlarged.   3. Left atrial size was mildly dilated.   4. The mitral valve is normal in structure. No evidence of mitral valve  regurgitation. No evidence of mitral stenosis.   5. The aortic valve has been repaired/replaced. Aortic valve  regurgitation is not visualized. No aortic stenosis is present. There is a  26 mm Edwards Sapien prosthetic (TAVR) valve present in the aortic  position. Procedure Date: 11/08/2023. Echo findings   are consistent with normal structure and function of the aortic valve  prosthesis.   ASSESSMENT & PLAN CHA2DS2-VASc Score = 5  The patient's score is based upon: CHF History: 1 HTN History: 1 Diabetes History: 0 Stroke History: 0 Vascular  Disease History: 1 Age Score: 2 Gender Score: 0       ASSESSMENT AND PLAN: Paroxysmal Atrial Fibrillation (ICD10:  I48.0) The patient's CHA2DS2-VASc score is 5, indicating a 7.2% annual risk of stroke.    He is currently in NSR. Education provided about Afib. Discussion about medication treatments and ablation going forward if indicated. After discussion, we will proceed with conservative observation at this time when monitor results have been finalized. Rhythm monitoring device recommended.    Secondary Hypercoagulable State (ICD10:  D68.69) The patient is at significant risk for stroke/thromboembolism based upon his CHA2DS2-VASc Score of 5.  Continue Warfarin (Coumadin ).  Patient is already on coumadin .     Follow up as scheduled with Dr. Francyne.   Terra Pac, PA-C  Af[facility] [address] [place], [place] [address] [phone]

## 2024-05-04 ENCOUNTER — Other Ambulatory Visit: Payer: Self-pay | Admitting: Emergency Medicine

## 2024-05-04 DIAGNOSIS — I495 Sick sinus syndrome: Secondary | ICD-10-CM

## 2024-05-04 MED ORDER — METOPROLOL SUCCINATE ER 100 MG PO TB24: 100.0000 mg | ORAL_TABLET | Freq: Every day | ORAL | 3 refills | Status: DC

## 2024-05-04 NOTE — Progress Notes (Unsigned)
 Croitoru, Mihai, MD  Davee Comer CROME, RN I called him and told him to increase the metoprolol  succinate to 100 mg (whole tablet) daily. If BP low please let us  know and we can decrease losartan .  Prescription sent to pt's pharmacy

## 2024-05-11 NOTE — Progress Notes (Addendum)
 [facility] post TCM follow up ( day 27 follow up)  05/11/24 Day 27 HN called patient home # [phone] and spoke with patient.   Date of call: 05/11/24   Discharged from: [facility]   Updates/Changes since last encounter: HN called patient for 30 day hospital follow up from most recent hospital stay for Acute pancreatitis without necrosis or infection, unspecified, pancreatitis type, hypoxia, community acquired pneumonia of left lower lobe of lung, acute pancreatitis, unspecified complication status, unspecified pancreatitis type.  Patient reports he is doing okay. He said once he has taken the antibiotic and steroid he is better. He said his stomach pain improved and no further nausea or vomiting. He denies any headache, dizziness, chest pain or acute shortness of breath. He has some coughing once in awhile but not getting up any mucous. He denies any fever or chills. He is eating and drinking good and no issues with bowels or bladder.  No other issues or concerns. HN will follow up with patient chronically due to high utilization.  HN notified patient to call PCP office if any new issues or concerns or go to [facility] or if emergency call [phone].  Patient understands and he has HN contact number.   Current Questions/Concerns: No  Is patient candidate for Navigation: Yes, HN will reach out in around 4 to 6 weeks for chronic follow up for high utilization.    Jon Sharps, RN [facility] Navigator [phone]   Electronically signed by: Jon Earnie Sharps, RN 05/11/2024 12:20 PM

## 2024-05-15 NOTE — Progress Notes (Signed)
 Patient is here today for INR.  His last INR was 4.1 on 05/01/24. He is taking 2 mg warfarin daily.  Patient's INR today is 4.7. Patient's goal is 2-3.  Per Reinaldo, Nodal-PA-C, patient to hold his warfarin X 3 days, then resume at 2 mg daily, RTC in 1 week.  Patient advised and appt made.    Electronically signed by: Delon Merilee Her, CMA 05/15/2024 9:48 AM  Delon Merilee Her, CMA

## 2024-05-17 ENCOUNTER — Encounter (HOSPITAL_COMMUNITY): Payer: Self-pay

## 2024-05-17 ENCOUNTER — Emergency Department (HOSPITAL_COMMUNITY)

## 2024-05-17 ENCOUNTER — Other Ambulatory Visit: Payer: Self-pay

## 2024-05-17 ENCOUNTER — Inpatient Hospital Stay (HOSPITAL_COMMUNITY)
Admission: EM | Admit: 2024-05-17 | Discharge: 2024-05-25 | DRG: 438 | Disposition: A | Discharge status: Home / Self Care | Attending: Internal Medicine | Admitting: Internal Medicine

## 2024-05-17 DIAGNOSIS — I5032 Chronic diastolic (congestive) heart failure: Secondary | ICD-10-CM | POA: Diagnosis present

## 2024-05-17 DIAGNOSIS — I480 Paroxysmal atrial fibrillation: Secondary | ICD-10-CM | POA: Diagnosis not present

## 2024-05-17 DIAGNOSIS — J439 Emphysema, unspecified: Secondary | ICD-10-CM | POA: Diagnosis present

## 2024-05-17 DIAGNOSIS — Z86711 Personal history of pulmonary embolism: Secondary | ICD-10-CM

## 2024-05-17 DIAGNOSIS — I4811 Longstanding persistent atrial fibrillation: Secondary | ICD-10-CM | POA: Diagnosis present

## 2024-05-17 DIAGNOSIS — I4891 Unspecified atrial fibrillation: Secondary | ICD-10-CM

## 2024-05-17 DIAGNOSIS — K2990 Gastroduodenitis, unspecified, without bleeding: Secondary | ICD-10-CM | POA: Diagnosis present

## 2024-05-17 DIAGNOSIS — T466X5A Adverse effect of antihyperlipidemic and antiarteriosclerotic drugs, initial encounter: Secondary | ICD-10-CM | POA: Diagnosis present

## 2024-05-17 DIAGNOSIS — K2100 Gastro-esophageal reflux disease with esophagitis, without bleeding: Secondary | ICD-10-CM | POA: Diagnosis present

## 2024-05-17 DIAGNOSIS — I10 Essential (primary) hypertension: Secondary | ICD-10-CM | POA: Diagnosis not present

## 2024-05-17 DIAGNOSIS — Z604 Social exclusion and rejection: Secondary | ICD-10-CM | POA: Diagnosis present

## 2024-05-17 DIAGNOSIS — Z555 Less than a high school diploma: Secondary | ICD-10-CM

## 2024-05-17 DIAGNOSIS — I110 Hypertensive heart disease with heart failure: Secondary | ICD-10-CM | POA: Diagnosis present

## 2024-05-17 DIAGNOSIS — I714 Abdominal aortic aneurysm, without rupture, unspecified: Secondary | ICD-10-CM | POA: Diagnosis present

## 2024-05-17 DIAGNOSIS — Z66 Do not resuscitate: Secondary | ICD-10-CM | POA: Diagnosis present

## 2024-05-17 DIAGNOSIS — E876 Hypokalemia: Secondary | ICD-10-CM | POA: Diagnosis present

## 2024-05-17 DIAGNOSIS — K209 Esophagitis, unspecified without bleeding: Secondary | ICD-10-CM | POA: Diagnosis not present

## 2024-05-17 DIAGNOSIS — Z7901 Long term (current) use of anticoagulants: Secondary | ICD-10-CM | POA: Diagnosis not present

## 2024-05-17 DIAGNOSIS — I959 Hypotension, unspecified: Secondary | ICD-10-CM | POA: Diagnosis not present

## 2024-05-17 DIAGNOSIS — I2510 Atherosclerotic heart disease of native coronary artery without angina pectoris: Secondary | ICD-10-CM | POA: Diagnosis present

## 2024-05-17 DIAGNOSIS — I4892 Unspecified atrial flutter: Secondary | ICD-10-CM | POA: Diagnosis present

## 2024-05-17 DIAGNOSIS — R791 Abnormal coagulation profile: Secondary | ICD-10-CM | POA: Diagnosis present

## 2024-05-17 DIAGNOSIS — K2970 Gastritis, unspecified, without bleeding: Secondary | ICD-10-CM | POA: Insufficient documentation

## 2024-05-17 DIAGNOSIS — J9811 Atelectasis: Secondary | ICD-10-CM | POA: Diagnosis present

## 2024-05-17 DIAGNOSIS — K449 Diaphragmatic hernia without obstruction or gangrene: Secondary | ICD-10-CM | POA: Diagnosis not present

## 2024-05-17 DIAGNOSIS — K861 Other chronic pancreatitis: Secondary | ICD-10-CM | POA: Diagnosis present

## 2024-05-17 DIAGNOSIS — R933 Abnormal findings on diagnostic imaging of other parts of digestive tract: Secondary | ICD-10-CM

## 2024-05-17 DIAGNOSIS — K859 Acute pancreatitis without necrosis or infection, unspecified: Secondary | ICD-10-CM | POA: Diagnosis present

## 2024-05-17 DIAGNOSIS — K853 Drug induced acute pancreatitis without necrosis or infection: Principal | ICD-10-CM | POA: Diagnosis present

## 2024-05-17 DIAGNOSIS — I252 Old myocardial infarction: Secondary | ICD-10-CM

## 2024-05-17 DIAGNOSIS — Z800 Family history of malignant neoplasm of digestive organs: Secondary | ICD-10-CM

## 2024-05-17 DIAGNOSIS — E785 Hyperlipidemia, unspecified: Secondary | ICD-10-CM | POA: Diagnosis present

## 2024-05-17 DIAGNOSIS — E1151 Type 2 diabetes mellitus with diabetic peripheral angiopathy without gangrene: Secondary | ICD-10-CM | POA: Diagnosis present

## 2024-05-17 DIAGNOSIS — K3189 Other diseases of stomach and duodenum: Secondary | ICD-10-CM | POA: Diagnosis present

## 2024-05-17 DIAGNOSIS — J440 Chronic obstructive pulmonary disease with acute lower respiratory infection: Secondary | ICD-10-CM | POA: Diagnosis present

## 2024-05-17 DIAGNOSIS — K2950 Unspecified chronic gastritis without bleeding: Secondary | ICD-10-CM | POA: Diagnosis not present

## 2024-05-17 DIAGNOSIS — Z87891 Personal history of nicotine dependence: Secondary | ICD-10-CM

## 2024-05-17 DIAGNOSIS — J189 Pneumonia, unspecified organism: Secondary | ICD-10-CM | POA: Diagnosis present

## 2024-05-17 DIAGNOSIS — M109 Gout, unspecified: Secondary | ICD-10-CM | POA: Diagnosis present

## 2024-05-17 DIAGNOSIS — Z952 Presence of prosthetic heart valve: Secondary | ICD-10-CM

## 2024-05-17 DIAGNOSIS — Z885 Allergy status to narcotic agent status: Secondary | ICD-10-CM

## 2024-05-17 DIAGNOSIS — I7143 Infrarenal abdominal aortic aneurysm, without rupture: Secondary | ICD-10-CM | POA: Diagnosis present

## 2024-05-17 DIAGNOSIS — I700 Atherosclerosis of aorta: Secondary | ICD-10-CM | POA: Diagnosis present

## 2024-05-17 DIAGNOSIS — J9601 Acute respiratory failure with hypoxia: Secondary | ICD-10-CM | POA: Diagnosis present

## 2024-05-17 DIAGNOSIS — Z7984 Long term (current) use of oral hypoglycemic drugs: Secondary | ICD-10-CM

## 2024-05-17 DIAGNOSIS — K851 Biliary acute pancreatitis without necrosis or infection: Principal | ICD-10-CM

## 2024-05-17 DIAGNOSIS — D72829 Elevated white blood cell count, unspecified: Secondary | ICD-10-CM | POA: Diagnosis not present

## 2024-05-17 DIAGNOSIS — N179 Acute kidney failure, unspecified: Secondary | ICD-10-CM | POA: Diagnosis present

## 2024-05-17 DIAGNOSIS — I272 Pulmonary hypertension, unspecified: Secondary | ICD-10-CM | POA: Diagnosis present

## 2024-05-17 DIAGNOSIS — R109 Unspecified abdominal pain: Secondary | ICD-10-CM | POA: Diagnosis present

## 2024-05-17 DIAGNOSIS — Z955 Presence of coronary angioplasty implant and graft: Secondary | ICD-10-CM

## 2024-05-17 DIAGNOSIS — Z8249 Family history of ischemic heart disease and other diseases of the circulatory system: Secondary | ICD-10-CM

## 2024-05-17 DIAGNOSIS — Z86718 Personal history of other venous thrombosis and embolism: Secondary | ICD-10-CM

## 2024-05-17 DIAGNOSIS — Z79899 Other long term (current) drug therapy: Secondary | ICD-10-CM

## 2024-05-17 DIAGNOSIS — K8689 Other specified diseases of pancreas: Secondary | ICD-10-CM | POA: Diagnosis not present

## 2024-05-17 DIAGNOSIS — Z7951 Long term (current) use of inhaled steroids: Secondary | ICD-10-CM

## 2024-05-17 DIAGNOSIS — Z7902 Long term (current) use of antithrombotics/antiplatelets: Secondary | ICD-10-CM

## 2024-05-17 DIAGNOSIS — Z9049 Acquired absence of other specified parts of digestive tract: Secondary | ICD-10-CM

## 2024-05-17 DIAGNOSIS — G4733 Obstructive sleep apnea (adult) (pediatric): Secondary | ICD-10-CM | POA: Diagnosis present

## 2024-05-17 DIAGNOSIS — I739 Peripheral vascular disease, unspecified: Secondary | ICD-10-CM | POA: Diagnosis present

## 2024-05-17 LAB — COMPREHENSIVE METABOLIC PANEL WITH GFR
ALT: 13 U/L (ref 0–44)
AST: 30 U/L (ref 15–41)
Albumin: 2.7 g/dL — ABNORMAL LOW (ref 3.5–5.0)
Alkaline Phosphatase: 58 U/L (ref 38–126)
Anion gap: 15 (ref 5–15)
BUN: 32 mg/dL — ABNORMAL HIGH (ref 8–23)
CO2: 19 mmol/L — ABNORMAL LOW (ref 22–32)
Calcium: 8.4 mg/dL — ABNORMAL LOW (ref 8.9–10.3)
Chloride: 101 mmol/L (ref 98–111)
Creatinine, Ser: 2.06 mg/dL — ABNORMAL HIGH (ref 0.61–1.24)
GFR, Estimated: 32 mL/min — ABNORMAL LOW (ref >60)
Glucose, Bld: 119 mg/dL — ABNORMAL HIGH (ref 70–99)
Potassium: 4 mmol/L (ref 3.5–5.1)
Sodium: 135 mmol/L (ref 135–145)
Total Bilirubin: 1.4 mg/dL — ABNORMAL HIGH (ref 0.0–1.2)
Total Protein: 6.8 g/dL (ref 6.5–8.1)

## 2024-05-17 LAB — CBC WITH DIFFERENTIAL/PLATELET
Abs Immature Granulocytes: 0.49 K/uL — ABNORMAL HIGH (ref 0.00–0.07)
Basophils Absolute: 0.1 K/uL (ref 0.0–0.1)
Basophils Relative: 0 %
Eosinophils Absolute: 0.1 K/uL (ref 0.0–0.5)
Eosinophils Relative: 1 %
HCT: 49.9 % (ref 39.0–52.0)
Hemoglobin: 15.8 g/dL (ref 13.0–17.0)
Immature Granulocytes: 2 %
Lymphocytes Relative: 3 %
Lymphs Abs: 0.7 K/uL (ref 0.7–4.0)
MCH: 29.4 pg (ref 26.0–34.0)
MCHC: 31.7 g/dL (ref 30.0–36.0)
MCV: 92.9 fL (ref 80.0–100.0)
Monocytes Absolute: 1.2 K/uL — ABNORMAL HIGH (ref 0.1–1.0)
Monocytes Relative: 6 %
Neutro Abs: 18.9 K/uL — ABNORMAL HIGH (ref 1.7–7.7)
Neutrophils Relative %: 88 %
Platelets: 371 K/uL (ref 150–400)
RBC: 5.37 MIL/uL (ref 4.22–5.81)
RDW: 16.5 % — ABNORMAL HIGH (ref 11.5–15.5)
WBC: 21.5 K/uL — ABNORMAL HIGH (ref 4.0–10.5)
nRBC: 0 % (ref 0.0–0.2)

## 2024-05-17 LAB — TROPONIN I (HIGH SENSITIVITY)
Troponin I (High Sensitivity): 12 ng/L (ref <18)
Troponin I (High Sensitivity): 11 ng/L (ref <18)

## 2024-05-17 LAB — LIPID PANEL
Cholesterol: 74 mg/dL (ref 0–200)
HDL: 19 mg/dL — ABNORMAL LOW (ref >40)
LDL Cholesterol: 35 mg/dL (ref 0–99)
Total CHOL/HDL Ratio: 3.9 ratio
Triglycerides: 101 mg/dL (ref <150)
VLDL: 20 mg/dL (ref 0–40)

## 2024-05-17 LAB — PROTIME-INR
INR: 3.1 — ABNORMAL HIGH (ref 0.8–1.2)
Prothrombin Time: 33.1 s — ABNORMAL HIGH (ref 11.4–15.2)

## 2024-05-17 LAB — LIPASE, BLOOD: Lipase: 161 U/L — ABNORMAL HIGH (ref 11–51)

## 2024-05-17 MED ORDER — LACTATED RINGERS IV SOLN: INTRAVENOUS | Status: AC

## 2024-05-17 MED ORDER — ONDANSETRON HCL 4 MG/2ML IJ SOLN
4.0000 mg | Freq: Once | INTRAMUSCULAR | Status: AC
  Administered 2024-05-17: 4 mg via INTRAVENOUS
  Filled 2024-05-17: qty 2

## 2024-05-17 MED ORDER — ACETAMINOPHEN 325 MG PO TABS
650.0000 mg | ORAL_TABLET | Freq: Four times a day (QID) | ORAL | Status: DC | PRN
  Administered 2024-05-19 – 2024-05-24 (×9): 650 mg via ORAL
  Filled 2024-05-17 (×9): qty 2

## 2024-05-17 MED ORDER — ATORVASTATIN CALCIUM 10 MG PO TABS
20.0000 mg | ORAL_TABLET | Freq: Every day | ORAL | Status: DC
  Administered 2024-05-17 – 2024-05-22 (×6): 20 mg via ORAL
  Filled 2024-05-17 (×6): qty 2

## 2024-05-17 MED ORDER — ALBUTEROL SULFATE (2.5 MG/3ML) 0.083% IN NEBU
2.5000 mg | INHALATION_SOLUTION | RESPIRATORY_TRACT | Status: DC | PRN
  Administered 2024-05-17 – 2024-05-19 (×2): 2.5 mg via RESPIRATORY_TRACT
  Filled 2024-05-17 (×2): qty 3

## 2024-05-17 MED ORDER — FENTANYL CITRATE PF 50 MCG/ML IJ SOSY
50.0000 ug | PREFILLED_SYRINGE | Freq: Once | INTRAMUSCULAR | Status: AC
  Administered 2024-05-17: 50 ug via INTRAVENOUS
  Filled 2024-05-17: qty 1

## 2024-05-17 MED ORDER — SODIUM CHLORIDE 0.9 % IV SOLN
500.0000 mg | Freq: Once | INTRAVENOUS | Status: AC
  Administered 2024-05-17: 500 mg via INTRAVENOUS
  Filled 2024-05-17: qty 5

## 2024-05-17 MED ORDER — ACETAMINOPHEN 650 MG RE SUPP
650.0000 mg | Freq: Four times a day (QID) | RECTAL | Status: DC | PRN
Start: 2024-05-17 — End: 2024-05-25

## 2024-05-17 MED ORDER — WARFARIN - PHYSICIAN DOSING INPATIENT: Freq: Every day | Status: DC

## 2024-05-17 MED ORDER — POLYETHYLENE GLYCOL 3350 17 G PO PACK
17.0000 g | PACK | Freq: Every day | ORAL | Status: DC | PRN
Start: 2024-05-17 — End: 2024-05-25
  Administered 2024-05-23 – 2024-05-24 (×2): 17 g via ORAL
  Filled 2024-05-17 (×2): qty 1

## 2024-05-17 MED ORDER — WARFARIN SODIUM 2 MG PO TABS
2.0000 mg | ORAL_TABLET | ORAL | Status: DC
  Filled 2024-05-17 (×2): qty 1

## 2024-05-17 MED ORDER — SODIUM CHLORIDE 0.9 % IV SOLN
1.0000 g | Freq: Once | INTRAVENOUS | Status: AC
  Administered 2024-05-17: 1 g via INTRAVENOUS
  Filled 2024-05-17: qty 10

## 2024-05-17 MED ORDER — WARFARIN SODIUM 2 MG PO TABS: 4.0000 mg | ORAL_TABLET | ORAL | Status: DC

## 2024-05-17 MED ORDER — HYDROMORPHONE HCL 1 MG/ML IJ SOLN
1.0000 mg | INTRAMUSCULAR | Status: DC | PRN
  Administered 2024-05-17 – 2024-05-22 (×21): 1 mg via INTRAVENOUS
  Filled 2024-05-17 (×21): qty 1

## 2024-05-17 MED ORDER — SODIUM CHLORIDE 0.9 % IV BOLUS
500.0000 mL | Freq: Once | INTRAVENOUS | Status: AC
  Administered 2024-05-17: 500 mL via INTRAVENOUS

## 2024-05-17 NOTE — Plan of Care (Signed)
  Problem: Education: Goal: Knowledge of General Education information will improve Description: Including pain rating scale, medication(s)/side effects and non-pharmacologic comfort measures Outcome: Progressing   Problem: Clinical Measurements: Goal: Ability to maintain clinical measurements within normal limits will improve Outcome: Progressing Goal: Diagnostic test results will improve Outcome: Progressing Goal: Respiratory complications will improve Outcome: Progressing Goal: Cardiovascular complication will be avoided Outcome: Progressing   Problem: Activity: Goal: Risk for activity intolerance will decrease Outcome: Progressing   Problem: Coping: Goal: Level of anxiety will decrease Outcome: Progressing   Problem: Elimination: Goal: Will not experience complications related to bowel motility Outcome: Progressing Goal: Will not experience complications related to urinary retention Outcome: Progressing   Problem: Pain Managment: Goal: General experience of comfort will improve and/or be controlled Outcome: Progressing   Problem: Safety: Goal: Ability to remain free from injury will improve Outcome: Progressing   Problem: Skin Integrity: Goal: Risk for impaired skin integrity will decrease Outcome: Progressing

## 2024-05-17 NOTE — ED Notes (Signed)
 Admission screening completed at this time.  Patient with no visitor at bedside.     05/17/24 1624  Patient Belongings  Patient/Family advised about valuables policy? Yes  Home Medications No meds brought to hospital  Patient Belongings Kept at bedside  Belongings at Bedside Clothing;Electronic device(s);Jewelry;Dentures  Bedside: Dentures Upper  Bedside: Jewelry Watch  Bedside: Electronic Device(s) Cellphone

## 2024-05-17 NOTE — Progress Notes (Signed)
 MEWS Progress Note  Patient Details Name: Tony Zhang MRN: [id number] DOB: 1946-08-24 Today's Date: 05/17/2024   MEWS Flowsheet Documentation:  Assess: MEWS Score Temp: 99.1 F (37.3 C) BP: 95/63 MAP (mmHg): 74 Pulse Rate: (!) 114 ECG Heart Rate: (!) 114 Resp: 20 Level of Consciousness: Alert SpO2: 91 % O2 Device: Nasal Cannula O2 Flow Rate (L/min): 3 L/min Assess: MEWS Score MEWS Temp: 0 MEWS Systolic: 1 MEWS Pulse: 2 MEWS RR: 0 MEWS LOC: 0 MEWS Score: 3 MEWS Score Color: Yellow Assess: SIRS CRITERIA SIRS Temperature : 0 SIRS Respirations : 0 SIRS Pulse: 1 SIRS WBC: 1 SIRS Score Sum : 2 SIRS Temperature : 0 SIRS Pulse: 1 SIRS Respirations : 0 SIRS WBC: 1 SIRS Score Sum : 2 Assess: if the MEWS score is Yellow or Red Were vital signs accurate and taken at a resting state?: Yes Does the patient meet 2 or more of the SIRS criteria?: No MEWS guidelines implemented : Yes, yellow Treat MEWS Interventions: Considered administering scheduled or prn medications/treatments as ordered Take Vital Signs Increase Vital Sign Frequency : Yellow: Q2hr x1, continue Q4hrs until patient remains green for 12hrs Escalate MEWS: Escalate: Yellow: Discuss with charge nurse and consider notifying provider and/or RRT Notify: Charge Nurse/RN Name of Charge Nurse/RN Notified: Wyvonna Provider Notification Provider Name/Title: Eben Date Provider Notified: 05/17/24 Time Provider Notified: 1857 Method of Notification: Page Provider response: No new orders Date of Provider Response: 05/17/24 Time of Provider Response: [id number]      Sharene JULIANNA Hummer 05/17/2024, 6:58 PM

## 2024-05-17 NOTE — Hospital Course (Addendum)
 This is a 78 year old male with past medical history of recurrent pancreatitis, history of CAD status post stent placement, status post cholecystectomy, AAA, DVT, aortic stenosis status post TAVR, A-fib on Coumadin  who presents to the emergency department with concerns of sharp abdominal pain and chest pain, and admitted for pancreatitis with Hospital course complicated by atrial fibrillation with RVR. Below is his hospital course:   Pancreatitis Patient had previous admission with pancreatitis in 03/2024 and prior to that 10/2023. 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 He presented on 05/17/2024 with pain the upper back, chest, abdomen that started about a week prior, with increased nausea and vomiting. Etiologies such as gallstone pancreatitis, hypertriglyceridemia, and alcohol induced pancreatitis ruled out during course of admission. Drug induced pancreatitis was addressed by making changes to his regimen such as discontinuation of atorvastatin  and his fibrate, since he is on Repatha  at home. GI was consulted and recommended outpatient follow up with EUS for evaluation of microlithiasis and signed off saying that they will arrange follow up with Dr. Abran or his APP at time of discharge.   Esophagitis CT scan on admission revealed esophagitis. EGD done on 8/5 and showed no esophagitis but did show chronic gastritis without bleeding and submucosal poly/nodule x3 in the second portion of the duodenum, which was not an acute issue. GI interpretation of EGD recommended EUS for workup of microlithiasis and also for evaluation of submucosal nodules. GI signed off saying that follow up would be arranged at time of discharge.   Atrial fibrillation with RVR Night of 8/1 patient went into A-fib with RVR with rates into the 160s and was put on amiodarone  drip. Patient was well controlled at low 100s 8/2 but morning 8/3 back in afib RVR up to 180s. Was given metoprolol  injection and  amiodarone  bolus. Cardio consulted and believe PAF likely precipitated by pancreatitis. Patient was feeling stable with no palpitations from 8/4 through day of discharge 8/7. Patient on amiodarone  IV, and transferred to amiodarone  PO on 05/22/2024. Patient started on diltiazem  on 05/21/2024 through 05/23/24. Patient started metoprolol  succinate 24 hr tablet 50 mg daily on 05/23/24.   AKI Patient had elevated creatinine at 2.06 on admission with usual baseline in range of 1.1-12. It was characterized as a 2/2 pancreatitis. Fluid resuscitation during the course of admission resolved AKI, and it downtrended to  1.30 by the time of discharge. Patient was able to make urine without problem through the course of admission and on day of discharge.   Leukocytosis Patient admitted with leukocytosis of 21.5 which downtrended to 16.6 and 11.7 during course of admission after administration of fluids and diet as tolerated. Patient leukocytosis stabilized during course of admission around 11.8 and 12.9. It was 15.4 on the day of discharge, but less concerning in the context of possible hemoconcentration and resolving pancreatitis with consistent symptomatic improvement throughout course of admission. Fluid resuscitation was paused near end of admission due to caution surrounding patient's HFpEF. Patient had no symptoms of newly introduced infection at time of discharge including new dysuria, cough, fever, chills, nausea, or vomiting.   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 EGD negative for sources of bleeding. Patient chronically anticoagulated on Coumadin  and this was continued during course of admission, paused for EGD and bridged with heparin  both before and after EGD.   CAD Patient has history of NSTEMI and is s/p drug-eluting stent-LAD 08/23. Patient  did not have symptoms concerning for ACS during  course of admission and was continued on atorvastatin  20mg  through course of admission until 8/5. Pharmacy recommended discontinuation of this medication and also fibrate since patient is already taking home Repatha . Due to pancreatitis risk and pancreatitis reoccurrence, atorvastatin  and fibrate discontinued.   HFpEF Cardio followed patient through the course of admission and signed off on day of discharge 8/7 and noted patient euvolemia and no dyspnea on discharge.   HTN Patients home antihypertensives were initially held at the time of admission due to AKI. Spironolactone  25mg  and losartan  50mg  were started during course of admission for BP control. Patient was normotensive at time of discharge 8/7.   PAD Patient has a longstanding history of occlusion of the distal abdominal aorta with reconstitution at the level of the common femoral arteries via the inferior epigastric arteries. Home cilostazol  was held during the course of this admission.

## 2024-05-17 NOTE — H&P (Addendum)
 Date: 05/17/2024               Patient Name:  Tony Zhang MRN: [id number]  DOB: [birth year] Age / Sex: 78 y.o., male   PCP: Hunter Mickey Browner, PA-C         Medical Service: Internal Medicine Teaching Service         Attending Physician: Dr. Reyes Fenton      First Contact: Armando Rossetti, MD    Second Contact: Dr. Libby Blanch, DO          Pager Information: First Contact Pager: [phone]   Second Contact Pager: [phone]   SUBJECTIVE   Chief Complaint: chest pain and abdominal pain  History of Present Illness: Tony Zhang is a 78 y.o. male with PMH of coronary artery disease status post stent placement, status post cholecystectomy, abdominal aortic aneurysm, deep venous thrombosis, hyperlipidemia, hypertension, congestive heart failure, and peripheral arterial disease, aortic stenosis status post TAVR, recently diagnosed atrial fibrillation on Coumadin  and pancreatitis. He has had upper abdominal and back pain that started 3 days ago. He specifies that the pain is in his upper back, chest, and abdomen that started on Monday, most focused in his right rib cage. He has had episodes of nausea but no vomiting. He notices that this is similar to how he feels when he has had episodes of pancreatitis in the past. He feels increased pain with inspiration. He has not had fevers or chills. He does not smoke tobacco produces or drink. He states his last drink was 50 years ago and that he quit smoking tobacco in 1980. He does note a history of acid reflux that worsens at night, for which he tries not to eat at night.   Endorses pain with inspiration, abdominal pain, chest pain, nausea, acid reflux Denies cough, sputum, fevers, chills, vomiting, diarrhea, itchiness, rashes, recent trauma, unintentional weight loss, changes in appetite, hematochezia, hematuria, sick contacts.  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, 3.4 cm infrarenal abdominal aortic aneurysm, and emphysema  Received Rocephin  and Zithromax , fentanyl  injection x2, zofran  injection 1x, NS bolus 500 mL  Meds: Acetaminophen  1000 mg every 4 for 7 days Albuterol  inhaler Allopurinol  300 mg daily Atorvastatin  20 mg daily Symbicort  2 puffs twice daily Vitamin B12 1000 mcg daily Empagliflozin  10 mg daily Fenofibric acid  135 mg daily Gabapentin  600 mg twice daily Dilaudid  0.5 (1 mg total) every 4 hours for 5 days Lidocaine  patch Metoprolol  succinate 50 mg daily Paroxetine 50 mg daily Repatha  injection 140 mg every 2 weeks Senna docusate Tamsulosin  0.4 mg daily Warfarin 2 mg daily Cilostazol  100 mg daily Furosemide  20 mg daily as needed Losartan  50 mg daily Zofran  8 mg twice daily as needed Prednisone  20 mg Spironolactone  25 mg daily  Patient reported:  Cilostazol  100 mg daily Furosemide  20 mg daily as needed Losartan  50 mg daily Zofran  8 mg twice daily as needed Prednisone  20 mg Spironolactone  25 mg daily  Current Meds  Medication Sig   albuterol  (VENTOLIN  HFA) 108 (90 Base) MCG/ACT inhaler Inhale 2 puffs into the lungs every 4 (four) hours as needed for wheezing or  shortness of breath.   allopurinol  (ZYLOPRIM ) 300 MG tablet Take 300 mg by mouth daily.   atorvastatin  (LIPITOR) 20 MG tablet Take 1 tablet (20 mg total) by mouth daily.   budesonide -formoterol  (SYMBICORT ) 160-4.5 MCG/ACT inhaler Inhale 2 puffs into the lungs in the morning and at bedtime.   Choline Fenofibrate  (FENOFIBRIC ACID ) 135 MG CPDR TAKE 1 TABLET BY MOUTH DAILY   cyanocobalamin  (VITAMIN B12) 1000 MCG tablet Take 1,000 mcg by mouth See  admin instructions. Take 1 tablet (1000mcg) by mouth on the 1st of every month.   empagliflozin  (JARDIANCE ) 10 MG TABS tablet Take 1 tablet (10 mg total) by mouth daily.   [Paused] furosemide  (LASIX ) 20 MG tablet Take 1 tablet (20 mg total) by mouth daily as needed.   gabapentin  (NEURONTIN ) 600 MG tablet Take 600 mg by mouth See admin instructions. Take 1 tablet (600mg ) by mouth twice daily, may increase to three times daily if needed for pain, neuropathy. (Patient taking differently: Take 600 mg by mouth 2 (two) times daily. Take 1 tablet (600mg ) by mouth twice daily, may increase to three times daily if needed for pain, neuropathy.)   HYDROmorphone  (DILAUDID ) 2 MG tablet Take 2 mg by mouth as needed. (Patient taking differently: Take 1 mg by mouth as needed.)   losartan  (COZAAR ) 50 MG tablet Take 1 tablet (50 mg total) by mouth daily.   metoprolol  succinate (TOPROL -XL) 100 MG 24 hr tablet Take 1 tablet (100 mg total) by mouth daily.   ondansetron  (ZOFRAN ) 8 MG tablet Take 8 mg by mouth 2 (two) times daily as needed for vomiting or nausea.   pyridOXINE  (B-6) 50 MG tablet Take 1 tablet (50 mg total) by mouth daily.   REPATHA  SURECLICK 140 MG/ML SOAJ INJECT 140 MGS SUBCUTANEOUSLY EVERY 14 DAYS   [Paused] spironolactone  (ALDACTONE ) 25 MG tablet Take 25 mg by mouth daily in the afternoon.   tamsulosin  (FLOMAX ) 0.4 MG CAPS capsule Take 0.4 mg by mouth daily in the afternoon.   warfarin (COUMADIN ) 2 MG tablet Take 1 tablet (2 mg total) by mouth See admin instructions. Take 1 tablet (2mg ) by mouth once daily at 1600 on Monday-Friday - Take 2 tablets (4mg ) once daily at 1600 on Saturday and Sunday.    Past Medical History CAD s/p stent AAA DVT HLD HTN CHF PAD Aortic stenosis s/p TAVR Atrial fibrillation pancreatitis  Past Surgical History Past Surgical History:  Procedure Laterality Date   APPENDECTOMY     BACK SURGERY     CHOLECYSTECTOMY N/A 01/17/2024   Procedure: LAPAROSCOPIC  CHOLECYSTECTOMY WITH ICG;  Surgeon: Ebbie Cough, MD;  Location: [facility] OR;  Service: General;  Laterality: N/A;   COLONOSCOPY N/A 04/13/2015   Procedure: COLONOSCOPY;  Surgeon: Lynwood Bohr, MD;  Location: [facility] ENDOSCOPY;  Service: Endoscopy;  Laterality: N/A;   CORONARY STENT INTERVENTION N/A 06/04/2022   Procedure: CORONARY STENT INTERVENTION;  Surgeon: Dann Candyce RAMAN, MD;  Location: [facility] INVASIVE CV LAB;  Service: Cardiovascular;  Laterality: N/A;   EMPYEMA DRAINAGE N/A 11/25/2014   Procedure: EMPYEMA DRAINAGE;  Surgeon: Dallas KATHEE Jude, MD;  Location: [facility] OR;  Service: Thoracic;  Laterality: N/A;   ESOPHAGOGASTRODUODENOSCOPY N/A 04/10/2015   Procedure: ESOPHAGOGASTRODUODENOSCOPY (EGD);  Surgeon: Jerrell Sol, MD;  Location: [facility] ENDOSCOPY;  Service: Endoscopy;  Laterality: N/A;   EYE SURGERY     both eyes, cataracts removed, denies lens implants   FLEXIBLE SIGMOIDOSCOPY N/A 04/12/2015   Procedure: FLEXIBLE SIGMOIDOSCOPY;  Surgeon: Lynwood Bohr, MD;  Location: [facility] ENDOSCOPY;  Service:  Endoscopy;  Laterality: N/A;   HERNIA REPAIR     LEFT HEART CATH AND CORONARY ANGIOGRAPHY N/A 06/04/2022   Procedure: LEFT HEART CATH AND CORONARY ANGIOGRAPHY;  Surgeon: Dann Candyce RAMAN, MD;  Location: [facility] INVASIVE CV LAB;  Service: Cardiovascular;  Laterality: N/A;   RIGHT HEART CATH AND CORONARY ANGIOGRAPHY N/A 09/28/2023   Procedure: RIGHT HEART CATH AND CORONARY ANGIOGRAPHY;  Surgeon: Wonda Sharper, MD;  Location: [facility] INVASIVE CV LAB;  Service: Cardiovascular;  Laterality: N/A;   SHOULDER SURGERY Right    TRANSCATHETER AORTIC VALVE REPLACEMENT,SUBCLAVIAN Left 11/08/2023   Procedure: Transcatheter Aortic Valve Replacement-Subclavian;  Surgeon: Wonda Sharper, MD;  Location: [facility] INVASIVE CV LAB;  Service: Cardiovascular;  Laterality: Left;   TRANSESOPHAGEAL ECHOCARDIOGRAM (CATH LAB) N/A 11/08/2023   Procedure: TRANSESOPHAGEAL ECHOCARDIOGRAM;  Surgeon: Wonda Sharper, MD;  Location: [facility] INVASIVE CV LAB;   Service: Cardiovascular;  Laterality: N/A;   VIDEO ASSISTED THORACOSCOPY Right 11/25/2014   Procedure: VIDEO ASSISTED THORACOSCOPY;  Surgeon: Dallas KATHEE Jude, MD;  Location: [facility] OR;  Service: Thoracic;  Laterality: Right;   VIDEO BRONCHOSCOPY N/A 11/25/2014   Procedure: VIDEO BRONCHOSCOPY;  Surgeon: Dallas KATHEE Jude, MD;  Location: [facility] OR;  Service: Thoracic;  Laterality: N/A;     Social:  Lives With: himself Occupation: retired Psychologist, occupational Support: friend, Danny Level of Function: independent in ADLs and iADLs PCP:  Nodal, Jr Reinaldo, PA-C  Substances: -Tobacco: Quit in 1980 -Alcohol: has not had alcohol in 50 years  Family History:  Family History  Problem Relation Age of Onset   CAD Father 45   Liver cancer Brother    CAD Sister      Allergies: Allergies as of 05/17/2024 - Review Complete 05/17/2024  Allergen Reaction Noted   Codeine Hives 09/29/2011    Review of Systems: A complete ROS was negative except as per HPI.   OBJECTIVE:   Physical Exam: Blood pressure (!) 136/45, pulse (!) 107, temperature 97.8 F (36.6 C), resp. rate (!) 30, height 6' 1 (1.854 m), weight 93 kg, SpO2 94%.  Constitutional: Ill-appearing HENT: normocephalic atraumatic, mucous membranes dry Eyes: conjunctiva non-erythematous, sclera non icteric, PERRL Cardiovascular: Tachycardic, no murmurs, rubs, gallops Pulmonary/Chest: normal work of breathing on room air, lungs clear to auscultation bilaterally Abdominal: Abdomen soft, diffuse pain focused in right upper, right lower, epigastric, and left upper regions. No tenderness to percussion of the right quadrant. No ascites appreciated. Skin: warm and dry. DP pulses 2+ bilaterally Psych: Normal mood and affect. Normal thought content  Labs: CBC    Component Value Date/Time   WBC 21.5 (H) 05/17/2024 1055   RBC 5.37 05/17/2024 1055   HGB 15.8 05/17/2024 1055   HGB 18.9 (H) 04/10/2024 1029   HGB 12.3 (L) 07/24/2014 1049   HCT 49.9 05/17/2024 1055    HCT 56.6 (H) 04/10/2024 1029   HCT 34.2 (L) 07/24/2014 1049   PLT 371 05/17/2024 1055   PLT 445 04/10/2024 1029   MCV 92.9 05/17/2024 1055   MCV 92 04/10/2024 1029   MCV 86.6 07/24/2014 1049   MCH 29.4 05/17/2024 1055   MCHC 31.7 05/17/2024 1055   RDW 16.5 (H) 05/17/2024 1055   RDW 15.4 04/10/2024 1029   RDW 15.1 (H) 07/24/2014 1049   LYMPHSABS 0.7 05/17/2024 1055   LYMPHSABS 1.1 07/24/2014 1049   MONOABS 1.2 (H) 05/17/2024 1055   MONOABS 0.6 07/24/2014 1049   EOSABS 0.1 05/17/2024 1055   EOSABS 0.3 07/24/2014 1049   BASOSABS 0.1 05/17/2024 1055   BASOSABS 0.0 07/24/2014  1049     CMP     Component Value Date/Time   NA 135 05/17/2024 1055   NA 137 04/24/2024 1549   K 4.0 05/17/2024 1055   CL 101 05/17/2024 1055   CO2 19 (L) 05/17/2024 1055   GLUCOSE 119 (H) 05/17/2024 1055   BUN 32 (H) 05/17/2024 1055   BUN 25 04/24/2024 1549   CREATININE 2.06 (H) 05/17/2024 1055   CREATININE 1.56 (H) 09/17/2013 1135   CALCIUM  8.4 (L) 05/17/2024 1055   PROT 6.8 05/17/2024 1055   PROT 7.9 04/10/2024 1029   ALBUMIN 2.7 (L) 05/17/2024 1055   ALBUMIN 4.5 04/10/2024 1029   AST 30 05/17/2024 1055   ALT 13 05/17/2024 1055   ALKPHOS 58 05/17/2024 1055   BILITOT 1.4 (H) 05/17/2024 1055   BILITOT 0.6 04/10/2024 1029   GFRNONAA 32 (L) 05/17/2024 1055   GFRAA >60 08/14/2019 0429    Imaging:  CT CHEST ABDOMEN PELVIS WO CONTRAST Result Date: 05/17/2024 CLINICAL DATA:  Chest abdominal pain and hypoxia. History of pancreatitis EXAM: CT CHEST, ABDOMEN AND PELVIS WITHOUT CONTRAST TECHNIQUE: Multidetector CT imaging of the chest, abdomen and pelvis was performed following the standard protocol without IV contrast. RADIATION DOSE REDUCTION: This exam was performed according to the departmental dose-optimization program which includes automated exposure control, adjustment of the mA and/or kV according to patient size and/or use of iterative reconstruction technique. COMPARISON:  Chest CT dated  04/11/2024. FINDINGS: Evaluation of this exam is limited in the absence of intravenous contrast. CT CHEST FINDINGS Cardiovascular: There is no cardiomegaly or pericardial effusion. There is 3 vessel coronary vascular calcification. Aortic valve repair. There is advanced atherosclerotic calcification of the thoracic aorta. Mild dilatation of the aortic arch measuring up to 3.9 cm. The central pulmonary arteries are grossly unremarkable. Mediastinum/Nodes: No hilar or mediastinal adenopathy. There is severe inflammatory changes and thickening of the mid and distal esophagus cough new since the prior CT most consistent with esophagitis. Underlying mass is less likely. There is associated mass effect and narrowing of the lumen of the mid to distal esophagus. No fluid collection or abscess. No extraluminal air. Lungs/Pleura: Background of emphysema. Small bilateral pleural effusions and bibasilar compressive atelectasis. Pneumonia is not excluded. No pneumothorax. The central airways are patent. Musculoskeletal: No acute osseous pathology. Degenerative changes of the spine. CT ABDOMEN PELVIS FINDINGS No intra-abdominal free air or free fluid. Hepatobiliary: Small cyst in the right lobe of the liver. No biliary dilatation. Cholecystectomy. Pancreas: Inflammatory changes surrounding the pancreas may be related to acute pancreatitis. Correlation with pancreatic enzymes recommended. No abscess or pseudocyst Spleen: Normal in size without focal abnormality. Adrenals/Urinary Tract: The adrenal glands unremarkable. There is no hydronephrosis or physis on either side. Small bilateral renal cysts. The visualized ureters and urinary bladder appear unremarkable. Stomach/Bowel: Inflammatory changes of the stomach predominantly involving the GE junction and proximal stomach. There is a 15 mm lipoma in the distal small bowel (64/6). There is no bowel obstruction. Appendectomy. Vascular/Lymphatic: Advanced aortoiliac atherosclerotic  disease. There is a 3.4 cm infrarenal abdominal aortic aneurysm. The IVC is unremarkable. No portal venous gas. There is no adenopathy. Reproductive: The prostate gland is mildly enlarged measuring 6 cm in transverse axial diameter. The seminal vesicles are symmetric Other: None Musculoskeletal: Osteopenia with degenerative changes. No acute osseous pathology. IMPRESSION: 1. Severe inflammatory changes and thickening of the mid and distal esophagus most consistent with esophagitis. There is associated mass effect and narrowing of the lumen of the mid to distal  esophagus. No fluid collection or abscess. No extraluminal air. 2. Inflammatory changes surrounding the pancreas may be related to acute pancreatitis. Correlation with pancreatic enzymes recommended. No abscess or pseudocyst. 3. Small bilateral pleural effusions and bibasilar compressive atelectasis. Pneumonia is not excluded. 4. 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 Recommend annual imaging followup by CTA or MRA. This recommendation follows 2010 ACCF/AHA/AATS/ACR/ASA/SCA/SCAI/SIR/STS/SVM Guidelines for the Diagnosis and Management of Patients with Thoracic Aortic Disease. Circulation.2010; 121: Z733-z630. Aortic aneurysm NOS (ICD10-I71.9) 5. A 3.4 cm infrarenal abdominal aortic aneurysm. Recommend follow-up ultrasound every 3 years. (Ref.: J Vasc Surg. 2018; 67:2-77 and J Am Coll Radiol 2013;10(10):789-794.) 6. Aortic Atherosclerosis (ICD10-I70.0) and Emphysema (ICD10-J43.9). Electronically Signed   By: Vanetta Chou M.D.   On: 05/17/2024 13:39   DG Chest Port 1 View Result Date: 05/17/2024 CLINICAL DATA:  Chest pain EXAM: PORTABLE CHEST 1 VIEW COMPARISON:  Chest radiograph April 11, 2024 FINDINGS: Bilateral coarse interstitial markings of lung fields increased to prior. No new consolidation. Blunting of left costophrenic angle likely due to scarring. The heart size is normal. Aortic knob is calcified. Tortuous thoracic aorta. No acute  osseous abnormality. IMPRESSION: Coarse bibasilar reticular opacities, increased to prior. No new consolidation. Electronically Signed   By: Megan  Zare M.D.   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 person living with a history of CAD s/p stent placement, AAA, DVT, HLD, HTN, CHF, PAD, aortic stenosis s/p TAVR, atrial fibrillation on Coumadin , and pancreatitis who presented with sharp abdominal and chest pain of four days duration and admitted for pancreatitis and atelectasis on hospital day 0  Pancreatitis Given patient with no history of AUD, this is unlikely the etiology of his reoccurence of pancreatitis from last admission in 03/2024. Possibly 2/2 hypertriglycermia; patient med rec reveals statin prescription but not reported to be taking. Other etiology could be drug induced, 2/2 to prednisone  prescribed at last discharge or furosemide . Prednisone  could be less considering history of interstitial pancreatitis before taking prednisone  and that it was a 3 day course. Also furosemide  could be considered less likely depending on when it was started. Gallstone pancreatitis less likely due to normal alkaline phosphatase, AST, and ALT but is still possible post cholecystectomy. Corrected Ca is 9.4, so unlikely that pancreatitis necrosis is a concomitant process currently. Will continue to monitor Ca through admission. May consider insulin  drip if patient has elevated triglycerides on lipid panel. -clear liquid diet, advance diet as tolerated -dilaudid  injection 1 mg -lipid panel  pending -tylenol  650 mg q6h PRN -LR infusion 125mL/hr  AKI Patient has elevated creatinine at 2.06, baseline seems to be around 1.1-1.2.  This is likely prerenal likely secondary to pancreatitis.  Will fluid resuscitate. - LR infusion for the next day  Leukocytosis White count up to 21.5.  This is likely reactive in the setting of acute pancreatitis. 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  Do not anticipate any  procedures.  Will resume home warfarin - Resume home warfarin  CAD History of NSTEMI Status post drug-eluting stent-LAD 08/23 No acute concerns for chest pain during this hospitalization - Resume home atorvastatin  20 mg daily  HFpEF No acute concern for exacerbation.  Patient will need aggressive fluid resuscitation during this acute bout of pancreatitis.  Will monitor volume status closely. - Monitor volume status  Hypertension Holding home antihypertensives at this time in the setting of AKI - Home medication includes metoprolol  succinate 0.5 mg daily - Hold losartan  50 mg daily - Hold home furosemide  20 mg daily - Hold home spironolactone  25 mg daily  PAD Patient has a longstanding history of occlusion of the distal abdominal aorta with reconstitution at the level of the common femoral arteries via the inferior epigastric arteries - At previous admission, patient's home cilostazol  was held - Continue to hold home cilostazol   Best practice: Diet: clear liquids diet VTE: Warfarin IVF: NS,500 mL/hr Code: DNR  Disposition planning: Prior to Admission Living Arrangement: Home, living alone Anticipated Discharge Location: Home  Dispo: Admit patient to Observation with expected length of stay less than 2 midnights.  Signed: Brad Prey  MS3 05/17/2024, 5:23 PM  Please contact IM Residency On-Call Pager at: [phone] or [phone].    Attestation for Student Documentation:  I personally was present and re-performed the history, physical exam and medical decision-making activities of this service and have verified that the service and findings are accurately documented in the student's note.  Tobie Gaines, DO 05/17/2024, 5:34 PM

## 2024-05-17 NOTE — ED Provider Notes (Signed)
 [facility] Provider Note   CSN: [id number] Arrival date & time: 05/17/24  1005     Patient presents with: Chest Pain and Abdominal Pain   Tony Zhang is a 78 y.o. male.   Patient is a 78 year old male who presents with abdominal and chest pain.  He has a history of coronary artery disease status post stent placement, AAA, DVT, hyperlipidemia, hypertension, CHF, and peripheral arterial disease, aortic stenosis status post TAVR, recently diagnosed atrial fibrillation on Coumadin  and pancreatitis.  He says he has had upper abdominal and back pain that started 3 days ago.  He said it started in his right upper back and now is across his abdomen and goes up into his chest.  He has had 3 episodes of pancreatitis in the past which he says was related to his gallbladder.  He status post cholecystectomy.  He says his pain today feels similar to his prior episodes of pancreatitis.  He has had some associated nausea and vomiting.  He denies any shortness of breath.  His oxygen saturations were noted to be a little low by EMS and he was placed on 2 L by nasal cannula.  He said his chest pain radiates up from his abdomen.  No other chest pain.  No leg pain or swelling.  No known fevers.  He does not drink alcohol.       Prior to Admission medications   Medication Sig Start Date End Date Taking? Authorizing Provider  albuterol  (VENTOLIN  HFA) 108 (90 Base) MCG/ACT inhaler Inhale 2 puffs into the lungs every 4 (four) hours as needed for wheezing or shortness of breath.    [provider]  allopurinol  (ZYLOPRIM ) 300 MG tablet Take 300 mg by mouth daily.    [provider]  atorvastatin  (LIPITOR) 20 MG tablet Take 1 tablet (20 mg total) by mouth daily. 10/05/23   Croitoru, Mihai, MD  budesonide -formoterol  (SYMBICORT ) 160-4.5 MCG/ACT inhaler Inhale 2 puffs into the lungs in the morning and at bedtime. 02/28/23   Gonfa, Taye T, MD  Choline  Fenofibrate  (FENOFIBRIC ACID ) 135 MG CPDR TAKE 1 TABLET BY MOUTH DAILY 09/26/23   Wonda Sharper, MD  cilostazol  (PLETAL ) 100 MG tablet Take 100 mg by mouth daily. Patient not taking: Reported on 05/02/2024    [provider]  cyanocobalamin  (VITAMIN B12) 1000 MCG tablet Take 1,000 mcg by mouth See admin instructions. Take 1 tablet (1000mcg) by mouth on the 1st of every month.    [provider]  empagliflozin  (JARDIANCE ) 10 MG TABS tablet Take 1 tablet (10 mg total) by mouth daily. 08/25/23   Wonda Sharper, MD  furosemide  (LASIX ) 20 MG tablet Take 1 tablet (20 mg total) by mouth daily as needed. Patient not taking: Reported on 05/02/2024 02/08/24 05/08/24  Croitoru, Mihai, MD  gabapentin  (NEURONTIN ) 600 MG tablet Take 600 mg by mouth See admin instructions. Take 1 tablet (600mg ) by mouth twice daily, may increase to three times daily if needed for pain, neuropathy. Patient taking differently: Take 600 mg by mouth 2 (two) times daily. Take 1 tablet (600mg ) by mouth twice daily, may increase to three times daily if needed for pain, neuropathy. 12/02/23 12/01/24  [provider]  HYDROmorphone  (DILAUDID ) 2 MG tablet Take 2 mg by mouth as needed. Patient taking differently: Take 1 mg by mouth as needed.    [provider]  lidocaine  (LIDODERM ) 5 % Place 1 patch onto the skin daily. Remove & Discard  patch within 12 hours or as directed by MD Patient not taking: Reported on 05/02/2024 04/14/24   Elnora Ip, MD  losartan  (COZAAR ) 50 MG tablet Take 1 tablet (50 mg total) by mouth daily. 02/08/24 05/08/24  Croitoru, Mihai, MD  metoprolol  succinate (TOPROL -XL) 100 MG 24 hr tablet Take 1 tablet (100 mg total) by mouth daily. 05/04/24   Croitoru, Mihai, MD  ondansetron  (ZOFRAN ) 8 MG tablet Take 8 mg by mouth 2 (two) times daily as needed for vomiting or nausea. 05/25/22   [provider]  predniSONE  (DELTASONE ) 20 MG tablet Take 20 mg by mouth See admin instructions.  Take 3 tablets (60mg ) by mouth daily for 3 days, then take 2 tablets (40mg ) for 2 days, then take 1 tablet (20mg ) for 2 days. Patient not taking: Reported on 05/02/2024    [provider]  pyridOXINE  (B-6) 50 MG tablet Take 1 tablet (50 mg total) by mouth daily. 02/28/23   Kathrin Mignon DASEN, MD  REPATHA  SURECLICK 140 MG/ML SOAJ INJECT 140 MGS SUBCUTANEOUSLY EVERY 14 DAYS 11/28/23   Croitoru, Mihai, MD  spironolactone  (ALDACTONE ) 25 MG tablet Take 25 mg by mouth daily in the afternoon. Patient not taking: Reported on 05/02/2024 03/07/24   [provider]  tamsulosin  (FLOMAX ) 0.4 MG CAPS capsule Take 0.4 mg by mouth daily in the afternoon.    [provider]  warfarin (COUMADIN ) 2 MG tablet Take 1 tablet (2 mg total) by mouth See admin instructions. Take 1 tablet (2mg ) by mouth once daily at 1600 on Monday-Friday - Take 2 tablets (4mg ) once daily at 1600 on Saturday and Sunday. 04/16/24 05/16/24  Rihner, Emilie, DO    Allergies: Codeine    Review of Systems  Constitutional:  Negative for chills, diaphoresis, fatigue and fever.  HENT:  Negative for congestion, rhinorrhea and sneezing.   Eyes: Negative.   Respiratory:  Negative for cough and shortness of breath.   Cardiovascular:  Positive for chest pain. Negative for leg swelling.  Gastrointestinal:  Positive for abdominal pain, nausea and vomiting. Negative for blood in stool and diarrhea.  Genitourinary:  Negative for difficulty urinating, flank pain and frequency.  Musculoskeletal:  Positive for back pain. Negative for arthralgias.  Skin:  Negative for rash.  Neurological:  Negative for dizziness, speech difficulty, weakness, numbness and headaches.    Updated Vital Signs BP 107/73   Pulse (!) 133   Temp 97.8 F (36.6 C)   Resp 17   Ht 6' 1 (1.854 m)   Wt 93 kg   SpO2 96%   BMI 27.05 kg/m   Physical Exam Constitutional:      Appearance: He is well-developed. He is ill-appearing.     Comments: Appears  uncomfortable, is tachycardic  HENT:     Head: Normocephalic and atraumatic.  Eyes:     Pupils: Pupils are equal, round, and reactive to light.  Cardiovascular:     Rate and Rhythm: Regular rhythm. Tachycardia present.     Heart sounds: Normal heart sounds.  Pulmonary:     Effort: Pulmonary effort is normal. No respiratory distress.     Breath sounds: Normal breath sounds. No wheezing or rales.  Chest:     Chest wall: No tenderness.  Abdominal:     General: Bowel sounds are normal.     Palpations: Abdomen is soft.     Tenderness: There is abdominal tenderness (Tenderness across the upper abdomen). There is no guarding or rebound.  Musculoskeletal:  General: Normal range of motion.     Cervical back: Normal range of motion and neck supple.     Comments: No edema or calf tenderness  Lymphadenopathy:     Cervical: No cervical adenopathy.  Skin:    General: Skin is warm and dry.     Findings: No rash.  Neurological:     Mental Status: He is alert and oriented to person, place, and time.     (all labs ordered are listed, but only abnormal results are displayed) Labs Reviewed  COMPREHENSIVE METABOLIC PANEL WITH GFR - Abnormal; Notable for the following components:      Result Value   CO2 19 (*)    Glucose, Bld 119 (*)    BUN 32 (*)    Creatinine, Ser 2.06 (*)    Calcium  8.4 (*)    Albumin 2.7 (*)    Total Bilirubin 1.4 (*)    GFR, Estimated 32 (*)    All other components within normal limits  LIPASE, BLOOD - Abnormal; Notable for the following components:   Lipase 161 (*)    All other components within normal limits  CBC WITH DIFFERENTIAL/PLATELET - Abnormal; Notable for the following components:   WBC 21.5 (*)    RDW 16.5 (*)    Neutro Abs 18.9 (*)    Monocytes Absolute 1.2 (*)    Abs Immature Granulocytes 0.49 (*)    All other components within normal limits  PROTIME-INR - Abnormal; Notable for the following components:   Prothrombin Time 33.1 (*)    INR 3.1  (*)    All other components within normal limits  TROPONIN I (HIGH SENSITIVITY)  TROPONIN I (HIGH SENSITIVITY)    EKG: EKG Interpretation Date/Time:  Thursday May 17 2024 12:50:34 EDT Ventricular Rate:  103 PR Interval:  161 QRS Duration:  94 QT Interval:  340 QTC Calculation: 445 R Axis:   -75  Text Interpretation: Sinus tachycardia Atrial premature complex Inferior infarct, old Anterior infarct, old Lateral leads are also involved Confirmed by Lenor Hollering [phone]) on 05/17/2024 1:07:03 PM  Radiology: CT CHEST ABDOMEN PELVIS WO CONTRAST Result Date: 05/17/2024 CLINICAL DATA:  Chest abdominal pain and hypoxia. History of pancreatitis EXAM: CT CHEST, ABDOMEN AND PELVIS WITHOUT CONTRAST TECHNIQUE: Multidetector CT imaging of the chest, abdomen and pelvis was performed following the standard protocol without IV contrast. RADIATION DOSE REDUCTION: This exam was performed according to the departmental dose-optimization program which includes automated exposure control, adjustment of the mA and/or kV according to patient size and/or use of iterative reconstruction technique. COMPARISON:  Chest CT dated 04/11/2024. FINDINGS: Evaluation of this exam is limited in the absence of intravenous contrast. CT CHEST FINDINGS Cardiovascular: There is no cardiomegaly or pericardial effusion. There is 3 vessel coronary vascular calcification. Aortic valve repair. There is advanced atherosclerotic calcification of the thoracic aorta. Mild dilatation of the aortic arch measuring up to 3.9 cm. The central pulmonary arteries are grossly unremarkable. Mediastinum/Nodes: No hilar or mediastinal adenopathy. There is severe inflammatory changes and thickening of the mid and distal esophagus cough new since the prior CT most consistent with esophagitis. Underlying mass is less likely. There is associated mass effect and narrowing of the lumen of the mid to distal esophagus. No fluid collection or abscess. No extraluminal  air. Lungs/Pleura: Background of emphysema. Small bilateral pleural effusions and bibasilar compressive atelectasis. Pneumonia is not excluded. No pneumothorax. The central airways are patent. Musculoskeletal: No acute osseous pathology. Degenerative changes of the spine. CT ABDOMEN PELVIS  FINDINGS No intra-abdominal free air or free fluid. Hepatobiliary: Small cyst in the right lobe of the liver. No biliary dilatation. Cholecystectomy. Pancreas: Inflammatory changes surrounding the pancreas may be related to acute pancreatitis. Correlation with pancreatic enzymes recommended. No abscess or pseudocyst Spleen: Normal in size without focal abnormality. Adrenals/Urinary Tract: The adrenal glands unremarkable. There is no hydronephrosis or physis on either side. Small bilateral renal cysts. The visualized ureters and urinary bladder appear unremarkable. Stomach/Bowel: Inflammatory changes of the stomach predominantly involving the GE junction and proximal stomach. There is a 15 mm lipoma in the distal small bowel (64/6). There is no bowel obstruction. Appendectomy. Vascular/Lymphatic: Advanced aortoiliac atherosclerotic disease. There is a 3.4 cm infrarenal abdominal aortic aneurysm. The IVC is unremarkable. No portal venous gas. There is no adenopathy. Reproductive: The prostate gland is mildly enlarged measuring 6 cm in transverse axial diameter. The seminal vesicles are symmetric Other: None Musculoskeletal: Osteopenia with degenerative changes. No acute osseous pathology. IMPRESSION: 1. Severe inflammatory changes and thickening of the mid and distal esophagus most consistent with esophagitis. There is associated mass effect and narrowing of the lumen of the mid to distal esophagus. No fluid collection or abscess. No extraluminal air. 2. Inflammatory changes surrounding the pancreas may be related to acute pancreatitis. Correlation with pancreatic enzymes recommended. No abscess or pseudocyst. 3. Small bilateral  pleural effusions and bibasilar compressive atelectasis. Pneumonia is not excluded. 4. 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 Recommend annual imaging followup by CTA or MRA. This recommendation follows 2010 ACCF/AHA/AATS/ACR/ASA/SCA/SCAI/SIR/STS/SVM Guidelines for the Diagnosis and Management of Patients with Thoracic Aortic Disease. Circulation.2010; 121: Z733-z630. Aortic aneurysm NOS (ICD10-I71.9) 5. A 3.4 cm infrarenal abdominal aortic aneurysm. Recommend follow-up ultrasound every 3 years. (Ref.: J Vasc Surg. 2018; 67:2-77 and J Am Coll Radiol 2013;10(10):789-794.) 6. Aortic Atherosclerosis (ICD10-I70.0) and Emphysema (ICD10-J43.9). Electronically Signed   By: Vanetta Chou M.D.   On: 05/17/2024 13:39   DG Chest Port 1 View Result Date: 05/17/2024 CLINICAL DATA:  Chest pain EXAM: PORTABLE CHEST 1 VIEW COMPARISON:  Chest radiograph April 11, 2024 FINDINGS: Bilateral coarse interstitial markings of lung fields increased to prior. No new consolidation. Blunting of left costophrenic angle likely due to scarring. The heart size is normal. Aortic knob is calcified. Tortuous thoracic aorta. No acute osseous abnormality. IMPRESSION: Coarse bibasilar reticular opacities, increased to prior. No new consolidation. Electronically Signed   By: Megan  Zare M.D.   On: 05/17/2024 11:25     Procedures   Medications Ordered in the ED  cefTRIAXone  (ROCEPHIN ) 1 g in sodium chloride  0.9 % 100 mL IVPB (has no administration in time range)  azithromycin  (ZITHROMAX ) 500 mg in sodium chloride  0.9 % 250 mL IVPB (has no administration in time range)  fentaNYL  (SUBLIMAZE ) injection 50 mcg (has no administration in time range)  fentaNYL  (SUBLIMAZE ) injection 50 mcg (50 mcg Intravenous Given 05/17/24 1104)  ondansetron  (ZOFRAN ) injection 4 mg (4 mg Intravenous Given 05/17/24 1103)  sodium chloride  0.9 % bolus 500 mL (0 mLs Intravenous Stopped 05/17/24 1325)                                    Medical Decision  Making Amount and/or Complexity of Data Reviewed Labs: ordered. Radiology: ordered.  Risk Prescription drug management.   This patient presents to the ED for concern of abdominal pain, chest pain, this involves an extensive number of treatment options, and is  a complaint that carries with it a high risk of complications and morbidity.  I considered the following differential and admission for this acute, potentially life threatening condition.  The differential diagnosis includes pancreatitis, pneumonia, ACS, aortic dissection/aneurysm, pleural effusions, pneumothorax, cholecystitis, bowel obstruction, colitis  MDM:    Patient is a 78 year old male who presents with upper abdominal pain that radiates to his chest.  He also was noted initially to be hypoxic but does not complain of any specific shortness of breath.  He does say it hurts when he takes a deep breath.  Labs are consistent with pancreatitis.  His LFTs are normal.  WBC is markedly elevated.  Elevated WBC count, elevated lipase CT imaging shows evidence of acute pancreatitis.  Chest x-ray shows some increased interstitial markings.  It does appear that he has got some consolidative findings on CT which could be atelectasis versus pneumonia.  He initially was taken off oxygen on arrival but had to be replaced on nasal cannula oxygen.  Will go ahead and start IV antibiotics.  He is on Coumadin  and is therapeutic.  I feel PE would be less likely.  He is tachycardic but his heart rate has improved with treatment in the ED.  His heart rate now is running around 108.  It was initially around 140.  He was given IV fluids as well.  Will consult unassigned medicine for admission.  Discussed with the IM teaching resident who admit the patient for further treatment.  CRITICAL CARE Performed by: Andrea [facility] Total critical care time: 60 minutes Critical care time was exclusive of separately billable procedures and treating other patients. Critical  care was necessary to treat or prevent imminent or life-threatening deterioration. Critical care was time spent personally by me on the following activities: development of treatment plan with patient and/or surrogate as well as nursing, discussions with consultants, evaluation of patient's response to treatment, examination of patient, obtaining history from patient or surrogate, ordering and performing treatments and interventions, ordering and review of laboratory studies, ordering and review of radiographic studies, pulse oximetry and re-evaluation of patient's condition.   (Labs, imaging, consults)  Labs: I Ordered, and personally interpreted labs.  The pertinent results include: Elevated WBC count, elevated lipase  Imaging Studies ordered: I ordered imaging studies including CT chest abdomen pelvis, chest x-ray I independently visualized and interpreted imaging. I agree with the radiologist interpretation  Additional history obtained from chart.  External records from outside source obtained and reviewed including prior notes  Cardiac Monitoring: The patient was maintained on a cardiac monitor.  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     CAD (coronary artery disease)    Chronic kidney disease    COPD (chronic obstructive pulmonary disease) (HCC)    Diabetes mellitus without complication (HCC)    DJD (degenerative joint disease)    Dyslipidemia    Dyspnea     Gout    Malignant hypertension    Peripheral vascular disease (HCC)  Pneumonia 08/18/2013   pt. 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 Wonda and Dr. Lucas   Severe aortic stenosis    Venous thrombosis    Recurrent     Medicines Meds ordered this encounter  Medications   fentaNYL  (SUBLIMAZE ) injection 50 mcg   ondansetron  (ZOFRAN ) injection 4 mg   sodium chloride  0.9 % bolus 500 mL   cefTRIAXone  (ROCEPHIN ) 1 g in sodium chloride  0.9 % 100 mL IVPB    Antibiotic Indication::   CAP   azithromycin  (ZITHROMAX ) 500 mg in sodium chloride  0.9 % 250 mL IVPB   fentaNYL  (SUBLIMAZE ) injection 50 mcg    I have reviewed the patients home medicines and have made adjustments as needed  Problem List / ED Course: Problem List Items Addressed This Visit       Respiratory   CAP (community acquired pneumonia)   Relevant Medications   cefTRIAXone  (ROCEPHIN ) 1 g in sodium chloride  0.9 % 100 mL IVPB (Start on 05/17/2024  2:30 PM)   azithromycin  (ZITHROMAX ) 500 mg in sodium chloride  0.9 % 250 mL IVPB (Start on 05/17/2024  2:30 PM)     Digestive   Acute pancreatitis - Primary   Relevant Medications   fentaNYL  (SUBLIMAZE ) injection 50 mcg (Start on 05/17/2024  2:30 PM)   Other Visit Diagnoses       Acute respiratory failure with hypoxia (HCC)                    Final diagnoses:  Acute biliary pancreatitis without infection or necrosis  Community acquired pneumonia, unspecified laterality  Acute respiratory failure with hypoxia [facility])    ED Discharge Orders     None          Lenor Hollering, MD 05/17/24 1446

## 2024-05-17 NOTE — ED Triage Notes (Addendum)
 Pt BIB [facility] from home for CP and abd pain since Monday. Pt has hx pancreatitis and recent valve replacement 3 mo ago. Pt was prescribed steroids for pancreatitis, but has since stopped taking them. Pt also complaining of diaphoresis and nausea. EMS placed pt on [facility] for desat into upper 80s. Pt also tachycardic in 100s. All other VSS.

## 2024-05-18 DIAGNOSIS — D72829 Elevated white blood cell count, unspecified: Secondary | ICD-10-CM | POA: Diagnosis not present

## 2024-05-18 DIAGNOSIS — K861 Other chronic pancreatitis: Secondary | ICD-10-CM

## 2024-05-18 DIAGNOSIS — K859 Acute pancreatitis without necrosis or infection, unspecified: Secondary | ICD-10-CM | POA: Diagnosis not present

## 2024-05-18 DIAGNOSIS — K209 Esophagitis, unspecified without bleeding: Secondary | ICD-10-CM

## 2024-05-18 DIAGNOSIS — K8689 Other specified diseases of pancreas: Secondary | ICD-10-CM | POA: Diagnosis not present

## 2024-05-18 LAB — HEPATIC FUNCTION PANEL
ALT: 13 U/L (ref 0–44)
AST: 24 U/L (ref 15–41)
Albumin: 2.2 g/dL — ABNORMAL LOW (ref 3.5–5.0)
Alkaline Phosphatase: 58 U/L (ref 38–126)
Bilirubin, Direct: 0.4 mg/dL — ABNORMAL HIGH (ref 0.0–0.2)
Indirect Bilirubin: 0.8 mg/dL (ref 0.3–0.9)
Total Bilirubin: 1.2 mg/dL (ref 0.0–1.2)
Total Protein: 6 g/dL — ABNORMAL LOW (ref 6.5–8.1)

## 2024-05-18 LAB — BASIC METABOLIC PANEL WITH GFR
Anion gap: 10 (ref 5–15)
BUN: 36 mg/dL — ABNORMAL HIGH (ref 8–23)
CO2: 23 mmol/L (ref 22–32)
Calcium: 8 mg/dL — ABNORMAL LOW (ref 8.9–10.3)
Chloride: 106 mmol/L (ref 98–111)
Creatinine, Ser: 2.26 mg/dL — ABNORMAL HIGH (ref 0.61–1.24)
GFR, Estimated: 29 mL/min — ABNORMAL LOW (ref >60)
Glucose, Bld: 128 mg/dL — ABNORMAL HIGH (ref 70–99)
Potassium: 4.5 mmol/L (ref 3.5–5.1)
Sodium: 139 mmol/L (ref 135–145)

## 2024-05-18 LAB — CBC
HCT: 43.6 % (ref 39.0–52.0)
Hemoglobin: 13.8 g/dL (ref 13.0–17.0)
MCH: 29.5 pg (ref 26.0–34.0)
MCHC: 31.7 g/dL (ref 30.0–36.0)
MCV: 93.2 fL (ref 80.0–100.0)
Platelets: 326 K/uL (ref 150–400)
RBC: 4.68 MIL/uL (ref 4.22–5.81)
RDW: 16.4 % — ABNORMAL HIGH (ref 11.5–15.5)
WBC: 21.5 K/uL — ABNORMAL HIGH (ref 4.0–10.5)
nRBC: 0 % (ref 0.0–0.2)

## 2024-05-18 LAB — HIV ANTIBODY (ROUTINE TESTING W REFLEX): HIV Screen 4th Generation wRfx: NONREACTIVE

## 2024-05-18 LAB — PROTIME-INR
INR: 3.4 — ABNORMAL HIGH (ref 0.8–1.2)
Prothrombin Time: 36 s — ABNORMAL HIGH (ref 11.4–15.2)

## 2024-05-18 MED ORDER — FUROSEMIDE 10 MG/ML IJ SOLN
40.0000 mg | Freq: Once | INTRAMUSCULAR | Status: AC
  Administered 2024-05-18: 40 mg via INTRAVENOUS
  Filled 2024-05-18: qty 4

## 2024-05-18 MED ORDER — WARFARIN - PHARMACIST DOSING INPATIENT: Freq: Every day | Status: DC

## 2024-05-18 MED ORDER — PANTOPRAZOLE SODIUM 40 MG PO TBEC
40.0000 mg | DELAYED_RELEASE_TABLET | Freq: Every day | ORAL | Status: DC
  Administered 2024-05-18: 40 mg via ORAL
  Filled 2024-05-18: qty 1

## 2024-05-18 MED ORDER — PANTOPRAZOLE SODIUM 40 MG IV SOLR
40.0000 mg | Freq: Two times a day (BID) | INTRAVENOUS | Status: DC
  Administered 2024-05-18 – 2024-05-24 (×13): 40 mg via INTRAVENOUS
  Filled 2024-05-18 (×13): qty 10

## 2024-05-18 NOTE — Consult Note (Signed)
 Consultation  Referring Provider: Medicine service/hatcher Primary Care Physician:  Tony Mickey Browner, PA-C Primary Gastroenterologist: Dr. Victorine 2016  Reason for Consultation: Recurrent pancreatitis  HPI: Tony Zhang is a 78 y.o. male with complicated past medical history, including coronary artery disease status post prior stent, COPD, abdominal aortic aneurysm, peripheral arterial disease, congestive heart failure, hypertension.  He has history of severe aortic stenosis and underwent TAVR in January 2025.  That course complicated by A-fib and now on Coumadin . Patient developed initial episode of pancreatitis within 48 hours of undergoing the TAVR in January.  Course complicated by hypotension which resulted in ICU stay. Initial CT on 11/10/2023 showed small dependent gallstones, no gallbladder wall inflammation no ductal dilation and an enlarged and inflamed pancreas with peripancreatic inflammatory changes and no pancreatic duct dilation Ultrasound 11/10/2023 with borderline gallbladder wall 3 mm no shadowing stones noted CBD poorly defined and difficult to confirm enlargement MRI/MRCP 11/11/2023-6 mm stone towards the neck of the gallbladder no additional gallstones seen and no common bile duct dilation, no choledocholithiasis, did have significant pancreatitis with mild retroperitoneal fluid stranding anteriorly left and right perirenal fascia. He was seen by surgery during that admission and decision was made to put off cholecystectomy due to severity of pancreatitis, he was eventually discharged on 11/24/2023. He relates that he developed COVID after he was discharged,  He was readmitted on 01/17/2024 to undergo laparoscopic cholecystectomy.  No IOC was done.  Labs showed normal LFTs at that time. Course was complicated by pneumonia which she was readmitted on 02/13/2024.  He says that his abdominal pain completely resolved after that initial episode of pancreatitis, and that he  did not have any issues until he developed recurrent pain towards the end of June and was readmitted on 04/11/2024 with recurrent pancreatitis. Repeat imaging done including CT of the abdomen and pelvis then MRI 04/13/2024 that shows diffuse inflammatory stranding and fluid around the pancreatic body and tail no acute pancreatic fluid collection, when compared to prior MRI from January 2025 there is new long segment of prominence of the pancreatic duct within the pancreatic body and tail with cystic change in the proximal pancreatic body measuring up to 0.5 cm, no pancreatic mass or obstructing calculus visible, no biliary dilation, mild splenomegaly and severe aortic atherosclerosis with chronic long segment of occlusion of the infrarenal abdominal aorta.  He had normal LFTs at the time of that admission.  Was able to be discharged 5 days later with improvement in symptoms. Again patient says that his abdominal pain did resolve and that he was not having any ongoing abdominal pain over the past few weeks.  He developed recurrent pain 4 to 5 days ago and then progressive symptoms of pain, poor appetite, and inability to take adequate p.o.'s and presented back to the emergency room yesterday.  Labs show WBC of 21,000 hemoglobin 15.8 Lipase 161 BUN 32/creatinine 2.06 T. bili 1.4 otherwise completely normal albumin 2.7  CT chest abdomen and pelvis without contrast shows severe inflammatory changes and thickening of the mid and distal esophagus new since prior CT consistent with esophagitis associated mass effect and narrowing of the lumen in the mid and distal esophagus no fluid collection or abscess COPD with small bilateral pleural effusions and compressive atelectasis cannot rule out pneumonia.  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  He says he has been having a difficult time taking a deep breath due to pain in his epigastrium and lower chest, and has been feeling somewhat winded, currently on 5 L nasal cannula.  He has been taking clear liquids, no clear complaints of odynophagia or dysphagia preceding this admission, he says he really was not able to eat much for few days prior to coming in.    Past Medical History:  Diagnosis Date   AAA (abdominal aortic aneurysm) (HCC)    Blood dyscrasia    followed by Dr. Cloretta, for clotting issue, has been on Coumadin  for about 20 yrs.     CAD (coronary artery disease)    Chronic kidney disease    COPD (chronic obstructive pulmonary disease) (HCC)    Diabetes mellitus without complication (HCC)    DJD (degenerative joint disease)    Dyslipidemia    Dyspnea    Gout    Malignant hypertension    Peripheral vascular disease (HCC)    Pneumonia 08/18/2013   pt. 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 Wonda and Dr. Lucas   Severe aortic stenosis    Venous thrombosis    Recurrent    Past Surgical History:  Procedure Laterality Date   APPENDECTOMY     BACK SURGERY     CHOLECYSTECTOMY N/A 01/17/2024   Procedure: LAPAROSCOPIC CHOLECYSTECTOMY WITH ICG;  Surgeon: Ebbie Cough, MD;  Location: [facility] OR;  Service: General;  Laterality: N/A;   COLONOSCOPY N/A 04/13/2015   Procedure: COLONOSCOPY;  Surgeon: Lynwood Bohr, MD;  Location: [facility] ENDOSCOPY;  Service: Endoscopy;  Laterality: N/A;   CORONARY STENT INTERVENTION N/A 06/04/2022   Procedure: CORONARY STENT INTERVENTION;  Surgeon: Dann Candyce RAMAN, MD;  Location: [facility] INVASIVE CV LAB;  Service: Cardiovascular;  Laterality: N/A;   EMPYEMA DRAINAGE N/A 11/25/2014   Procedure: EMPYEMA DRAINAGE;   Surgeon: Dallas KATHEE Jude, MD;  Location: [facility] OR;  Service: Thoracic;  Laterality: N/A;   ESOPHAGOGASTRODUODENOSCOPY N/A 04/10/2015   Procedure: ESOPHAGOGASTRODUODENOSCOPY (EGD);  Surgeon: Jerrell Sol, MD;  Location: [facility] ENDOSCOPY;  Service: Endoscopy;  Laterality: N/A;   EYE SURGERY     both eyes, cataracts removed, denies lens implants   FLEXIBLE SIGMOIDOSCOPY N/A 04/12/2015   Procedure: FLEXIBLE SIGMOIDOSCOPY;  Surgeon: Lynwood Bohr, MD;  Location: [facility] ENDOSCOPY;  Service: Endoscopy;  Laterality: N/A;   HERNIA REPAIR     LEFT HEART CATH AND CORONARY ANGIOGRAPHY N/A 06/04/2022   Procedure: LEFT HEART CATH AND CORONARY ANGIOGRAPHY;  Surgeon: Dann Candyce RAMAN, MD;  Location: [facility] INVASIVE CV LAB;  Service: Cardiovascular;  Laterality: N/A;   RIGHT HEART CATH AND CORONARY ANGIOGRAPHY N/A 09/28/2023   Procedure: RIGHT HEART CATH AND CORONARY ANGIOGRAPHY;  Surgeon: Wonda Sharper, MD;  Location: [facility] INVASIVE CV LAB;  Service: Cardiovascular;  Laterality: N/A;   SHOULDER SURGERY Right    TRANSCATHETER AORTIC VALVE REPLACEMENT,SUBCLAVIAN Left 11/08/2023   Procedure: Transcatheter Aortic Valve Replacement-Subclavian;  Surgeon: Wonda Sharper, MD;  Location: [facility] INVASIVE CV LAB;  Service: Cardiovascular;  Laterality: Left;   TRANSESOPHAGEAL ECHOCARDIOGRAM (CATH LAB) N/A 11/08/2023   Procedure: TRANSESOPHAGEAL ECHOCARDIOGRAM;  Surgeon: Wonda Sharper, MD;  Location: [facility] INVASIVE CV LAB;  Service: Cardiovascular;  Laterality: N/A;   VIDEO ASSISTED THORACOSCOPY Right 11/25/2014   Procedure: VIDEO ASSISTED THORACOSCOPY;  Surgeon: Dallas KATHEE Jude, MD;  Location: [facility] OR;  Service: Thoracic;  Laterality: Right;   VIDEO BRONCHOSCOPY N/A 11/25/2014   Procedure: VIDEO BRONCHOSCOPY;  Surgeon: Dallas KATHEE Jude, MD;  Location: [facility] OR;  Service: Thoracic;  Laterality: N/A;    Prior to Admission medications   Medication Sig Start Date End Date Taking? Authorizing Provider  albuterol  (VENTOLIN  HFA) 108 (90 Base)  MCG/ACT inhaler Inhale 2 puffs into the lungs every 4 (four) hours as needed for wheezing or shortness of breath.   Yes [provider]  allopurinol  (ZYLOPRIM ) 300 MG tablet Take 300 mg by mouth daily.   Yes [provider]  atorvastatin  (LIPITOR) 20 MG tablet Take 1 tablet (20 mg total) by mouth daily. 10/05/23  Yes Croitoru, Mihai, MD  budesonide -formoterol  (SYMBICORT ) 160-4.5 MCG/ACT inhaler Inhale 2 puffs into the lungs in the morning and at bedtime. 02/28/23  Yes Gonfa, Taye T, MD  Choline Fenofibrate  (FENOFIBRIC ACID ) 135 MG CPDR TAKE 1 TABLET BY MOUTH DAILY 09/26/23  Yes Wonda Sharper, MD  cyanocobalamin  (VITAMIN B12) 1000 MCG tablet Take 1,000 mcg by mouth See admin instructions. Take 1 tablet (1000mcg) by mouth on the 1st of every month.   Yes [provider]  empagliflozin  (JARDIANCE ) 10 MG TABS tablet Take 1 tablet (10 mg total) by mouth daily. 08/25/23  Yes Wonda Sharper, MD  furosemide  (LASIX ) 20 MG tablet Take 1 tablet (20 mg total) by mouth daily as needed. 02/08/24 05/08/24 Yes Croitoru, Mihai, MD  gabapentin  (NEURONTIN ) 600 MG tablet Take 600 mg by mouth See admin instructions. Take 1 tablet (600mg ) by mouth twice daily, may increase to three times daily if needed for pain, neuropathy. Patient taking differently: Take 600 mg by mouth 2 (two) times daily. Take 1 tablet (600mg ) by mouth twice daily, may increase to three times daily if needed for pain, neuropathy. 12/02/23 12/01/24 Yes [provider]  HYDROmorphone  (DILAUDID ) 2 MG tablet Take 2 mg by mouth as needed. Patient taking differently: Take 1 mg by mouth as needed.   Yes [provider]  losartan  (COZAAR ) 50 MG tablet Take 1 tablet (50 mg total) by mouth daily. 02/08/24 05/17/24 Yes Croitoru, Mihai, MD  metoprolol  succinate (TOPROL -XL) 100 MG 24 hr tablet Take 1 tablet (100 mg total) by mouth daily. 05/04/24  Yes Croitoru, Mihai, MD  ondansetron  (ZOFRAN ) 8 MG tablet Take 8 mg by mouth 2  (two) times daily as needed for vomiting or nausea. 05/25/22  Yes [provider]  pyridOXINE  (B-6) 50 MG tablet Take 1 tablet (50 mg total) by mouth daily. 02/28/23  Yes Kathrin Mignon DASEN, MD  REPATHA  SURECLICK 140 MG/ML SOAJ INJECT 140 MGS SUBCUTANEOUSLY EVERY 14 DAYS 11/28/23  Yes Croitoru, Mihai, MD  spironolactone  (ALDACTONE ) 25 MG tablet Take 25 mg by mouth daily in the afternoon. 03/07/24  Yes [provider]  tamsulosin  (FLOMAX ) 0.4 MG CAPS capsule Take 0.4 mg by mouth daily in the afternoon.   Yes [provider]  warfarin (COUMADIN ) 2 MG tablet Take 1 tablet (2 mg total) by mouth See admin instructions. Take 1 tablet (2mg ) by mouth once daily at 1600 on Monday-Friday - Take 2 tablets (4mg ) once daily at 1600 on Saturday and Sunday. 04/16/24 05/17/24 Yes Rihner, Emilie, DO  cilostazol  (PLETAL ) 100 MG tablet Take 100 mg by mouth daily. Patient not taking: No sig reported    [provider]    Current Facility-Administered Medications  Medication Dose Route Frequency Provider Last Rate Last Admin  acetaminophen  (TYLENOL ) tablet 650 mg  650 mg Oral Q6H PRN Tobie Gaines, DO       Or   acetaminophen  (TYLENOL ) suppository 650 mg  650 mg Rectal Q6H PRN Tobie Gaines, DO       albuterol  (PROVENTIL ) (2.5 MG/3ML) 0.083% nebulizer solution 2.5 mg  2.5 mg Inhalation Q4H PRN Patel, Amar, DO   2.5 mg at 05/17/24 1852   atorvastatin  (LIPITOR) tablet 20 mg  20 mg Oral Daily Patel, Amar, DO   20 mg at 05/18/24 9156   HYDROmorphone  (DILAUDID ) injection 1 mg  1 mg Intravenous Q3H PRN Patel, Amar, DO   1 mg at 05/18/24 9154   lactated ringers  infusion   Intravenous Continuous Tobie Gaines, DO 75 mL/hr at 05/18/24 0842 Rate Change at 05/18/24 9157   pantoprazole  (PROTONIX ) EC tablet 40 mg  40 mg Oral Daily Patel, Amar, DO   40 mg at 05/18/24 9156   polyethylene glycol (MIRALAX  / GLYCOLAX ) packet 17 g  17 g Oral Daily PRN Tobie Gaines, DO       Warfarin - Pharmacist Dosing Inpatient    Does not apply q1600 Reome, Earle J, RPH        Allergies as of 05/17/2024 - Review Complete 05/17/2024  Allergen Reaction Noted   Codeine Hives 09/29/2011    Family History  Problem Relation Age of Onset   CAD Father 30   Liver cancer Brother    CAD Sister     Social History   Socioeconomic History   Marital status: Widowed    Spouse name: Not on file   Number of children: 0   Years of education: Not on file   Highest education level: 6th grade  Occupational History    Comment: semi retired  Tobacco Use   Smoking status: Former    Current packs/day: 0.00    Types: Cigarettes    Quit date: 10/18/1992    Years since quitting: 31.6   Smokeless tobacco: Never  Vaping Use   Vaping status: Never Used  Substance and Sexual Activity   Alcohol use: No   Drug use: No   Sexual activity: Not on file  Other Topics Concern   Not on file  Social History Narrative   Not on file   Social Drivers of Health   Financial Resource Strain: Low Risk  (06/04/2022)   Overall Financial Resource Strain (CARDIA)    Difficulty of Paying Living Expenses: Not hard at all  Food Insecurity: No Food Insecurity (05/17/2024)   Hunger Vital Sign    Worried About Running Out of Food in the Last Year: Never true    Ran Out of Food in the Last Year: Never true  Transportation Needs: No Transportation Needs (05/17/2024)   PRAPARE - Administrator, Civil Service (Medical): No    Lack of Transportation (Non-Medical): No  Physical Activity: Not on file  Stress: Not on file  Social Connections: Socially Isolated (05/17/2024)   Social Connection and Isolation Panel    Frequency of Communication with Friends and Family: Once a week    Frequency of Social Gatherings with Friends and Family: Once a week    Attends Religious Services: Never    Database administrator or Organizations: No    Attends Banker Meetings: Never    Marital Status: Widowed  Intimate Partner Violence: Not At  Risk (05/17/2024)   Humiliation, Afraid, Rape, and Kick questionnaire    Fear of Current or Ex-Partner: No  Emotionally Abused: No    Physically Abused: No    Sexually Abused: No    Review of Systems: Pertinent positive and negative review of systems were noted in the above HPI section.  All other review of systems was otherwise negative.   Physical Exam: Vital signs in last 24 hours: Temp:  [97.8 F (36.6 C)-99.3 F (37.4 C)] 98.1 F (36.7 C) (08/01 0807) Pulse Rate:  [100-133] 109 (08/01 0849) Resp:  [17-36] 19 (08/01 0849) BP: (80-157)/(42-86) 157/67 (08/01 0849) SpO2:  [91 %-99 %] 95 % (08/01 0849) Weight:  [99.3 kg] 99.3 kg (07/31 1836) Last BM Date : 05/17/24 General:   Alert,  Well-developed, acute and chronically ill-appearing elderly white male, pleasant and cooperative in NAD-increased work of breathing, uncomfortable appearing Head:  Normocephalic and atraumatic. Eyes:  Sclera clear, no icterus.   Conjunctiva pink. Ears:  Normal auditory acuity. Nose:  No deformity, discharge,  or lesions. Mouth:  No deformity or lesions.   Neck:  Supple; no masses or thyromegaly. Lungs: Decreased breath sounds bilaterally heart:  CV;tachycardic Regular rate and rhythm; no murmurs, clicks, rubs,  or gallops. Abdomen: Protuberant, diffuse tenderness across the upper and mid abdomen with guarding, no rebound, bowel sounds are present, no palpable mass or hepatosplenomegaly Rectal: Not done Msk:  Symmetrical without gross deformities. . Pulses:  Normal pulses noted. Extremities:  Without clubbing or edema. Neurologic:  Alert and  oriented x4;  grossly normal neurologically. Skin:  Intact without significant lesions or rashes.. Psych:  Alert and cooperative. Normal mood and affect.  Intake/Output from previous day: 07/31 0701 - 08/01 0700 In: 1253.2 [P.O.:240; I.V.:1013.2] Out: 200 [Urine:200] Intake/Output this shift: Total I/O In: 1125.8 [P.O.:400; I.V.:725.8] Out: -   Lab  Results: Recent Labs    05/17/24 1055 05/18/24 0353  WBC 21.5* 21.5*  HGB 15.8 13.8  HCT 49.9 43.6  PLT 371 326   BMET Recent Labs    05/17/24 1055 05/18/24 0353  NA 135 139  K 4.0 4.5  CL 101 106  CO2 19* 23  GLUCOSE 119* 128*  BUN 32* 36*  CREATININE 2.06* 2.26*  CALCIUM  8.4* 8.0*   LFT Recent Labs    05/18/24 0912  PROT 6.0*  ALBUMIN 2.2*  AST 24  ALT 13  ALKPHOS 58  BILITOT 1.2  BILIDIR 0.4*  IBILI 0.8   PT/INR Recent Labs    05/17/24 1055 05/18/24 0353  LABPROT 33.1* 36.0*  INR 3.1* 3.4*   Hepatitis Panel No results for input(s): HEPBSAG, HCVAB, HEPAIGM, HEPBIGM in the last 72 hours.    IMPRESSION:  #31 78 year old white male with multiple serious comorbidities who was hospitalized in January 2025 with congestive heart failure and severe aortic stenosis and underwent TAVR.  Developed A-fib and discharged on Coumadin .  During that hospital stay within 48 hours of TAVR he developed abdominal pain and hypotension and was diagnosed with acute pancreatitis, initially felt to be gallstone pancreatitis as he did have at least 1 stone in the gallbladder.  He never had any LFT elevation and MRCP did not demonstrate choledocholithiasis.  Cholecystectomy was delayed due to recent TAVR and significant pancreatitis and ultimately underwent laparoscopic cholecystectomy on 01/17/2024, no IOC but normal LFTs at that time  Readmitted towards the end of June with recurrent pancreatitis and at that time MRI MRCP again showed diffuse inflammatory stranding about the pancreatic body and tail and also showed a new long segment of prominence of the pancreatic duct within the pancreatic body and tail  with cystic change in the proximal pancreatic body and new mild splenomegaly  He recovered from that episode symptomatically and then developed recurrent symptoms 4 to 5 days ago and readmitted yesterday. Symptoms are a bit different this time with more epigastric and lower  chest pain and associated sense of inability to take a deep breath due to pain. Noncontrasted CT does show pancreatitis which does not appear severe and there are no associated fluid collections, however in addition he has what appears to be severe esophagitis especially in the lower esophagus GE junction where there is some mass effect and inflammatory changes of the stomach as well.  He likely has more than 1 issue contributing to his current symptoms.  Etiology of the recurrent pancreatitis is not clear at this time, is not certain that he ever had choledocholithiasis though that was felt the most likely etiology for his initial episode as he did have cholelithiasis. MRCP did show new changes in the pancreatic duct with pancreatic ductal dilation and a cystic area in the pancreatic duct in the body of the pancreas. May have developed an underlying pancreatic lesion, or pseudocyst with subsequent involvement of the pancreatic duct and/or stricture.  #2 new severe esophagitis, especially at the GE junction, rule out neoplasm  #3 severe peripheral arterial disease, and chronic occlusion of the infrarenal abdominal aortic aneurysm #4 COPD,- #5 small bilateral effusions, cannot rule out pneumonia #6 atrial fibrillation-on Coumadin  #7 remote history of peptic ulcer disease with bleed #8 coronary artery disease status post stent #9.  Acute kidney injury  Plan; scheduled for MRI/MRCP to reevaluate the pancreatic duct, and pancreatic parenchyma Okay for full liquid diet as tolerates Pulmonary toilet with incentive spirometry etc. He will need EGD this admission-would prefer to wait until his pulmonary status improves. Twice daily PPI IV Other recommendations pending MRI/MRCP Please continue IV fluid hydration with LR GI will follow with you     Velvie Thomaston EsterwoodPA-C  05/18/2024, 12:20 PM

## 2024-05-18 NOTE — Plan of Care (Signed)

## 2024-05-18 NOTE — Progress Notes (Addendum)
 PHARMACY - ANTICOAGULATION CONSULT NOTE  Pharmacy Consult for warfarin Indication: atrial fibrillation and hx VTE  Allergies  Allergen Reactions   Codeine Hives    Patient Measurements: Height: 6' 1 (185.4 cm) Weight: 99.3 kg (218 lb 14.7 oz) IBW/kg (Calculated) : 79.9 HEPARIN  DW (KG): 99.3  Vital Signs: Temp: 98.1 F (36.7 C) (08/01 0807) Temp Source: Oral (08/01 0807) BP: 141/69 (08/01 0807) Pulse Rate: 106 (08/01 0807)  Labs: Recent Labs    05/17/24 1055 05/17/24 1320 05/18/24 0353  HGB 15.8  --  13.8  HCT 49.9  --  43.6  PLT 371  --  326  LABPROT 33.1*  --  36.0*  INR 3.1*  --  3.4*  CREATININE 2.06*  --  2.26*  TROPONINIHS 12 11  --     Estimated Creatinine Clearance: 33.4 mL/min (A) (by C-G formula based on SCr of 2.26 mg/dL (H)).   Medical History: Past Medical History:  Diagnosis Date   AAA (abdominal aortic aneurysm) (HCC)    Blood dyscrasia    followed by Dr. Cloretta, for clotting issue, has been on Coumadin  for about 20 yrs.     CAD (coronary artery disease)    Chronic kidney disease    COPD (chronic obstructive pulmonary disease) (HCC)    Diabetes mellitus without complication (HCC)    DJD (degenerative joint disease)    Dyslipidemia    Dyspnea    Gout    Malignant hypertension    Peripheral vascular disease (HCC)    Pneumonia 08/18/2013   pt. 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 Wonda and Dr. Lucas   Severe aortic stenosis    Venous thrombosis    Recurrent    Medications:  Medications Prior to Admission  Medication Sig Dispense Refill Last Dose/Taking   albuterol  (VENTOLIN  HFA) 108 (90 Base) MCG/ACT inhaler Inhale 2 puffs into the lungs every 4 (four) hours as needed for wheezing or shortness of breath.   Unknown    allopurinol  (ZYLOPRIM ) 300 MG tablet Take 300 mg by mouth daily.   05/16/2024   atorvastatin  (LIPITOR) 20 MG tablet Take 1 tablet (20 mg total) by mouth daily. 90 tablet 3 05/16/2024   budesonide -formoterol  (SYMBICORT ) 160-4.5 MCG/ACT inhaler Inhale 2 puffs into the lungs in the morning and at bedtime. 1 each 12 05/17/2024   Choline Fenofibrate  (FENOFIBRIC ACID ) 135 MG CPDR TAKE 1 TABLET BY MOUTH DAILY 90 capsule 3 05/16/2024   cyanocobalamin  (VITAMIN B12) 1000 MCG tablet Take 1,000 mcg by mouth See admin instructions. Take 1 tablet ( ) by mouth on the 1st of every month.   05/11/2024   empagliflozin  (JARDIANCE ) 10 MG TABS tablet Take 1 tablet (10 mg total) by mouth daily. 90 tablet 3 05/16/2024   [Paused] furosemide  (LASIX ) 20 MG tablet Take 1 tablet (20 mg total) by mouth daily as needed. 30 tablet 3 Unknown   gabapentin  (NEURONTIN ) 600 MG tablet Take 600 mg by mouth See admin instructions. Take 1 tablet (600mg ) by mouth twice daily, may increase to three times daily if needed for pain, neuropathy. (Patient taking differently: Take 600 mg by mouth 2 (two) times daily. Take 1 tablet (600mg ) by mouth twice daily, may increase to three times daily if needed for pain, neuropathy.)   05/16/2024   HYDROmorphone  (DILAUDID )  2 MG tablet Take 2 mg by mouth as needed. (Patient taking differently: Take 1 mg by mouth as needed.)   Unknown   losartan  (COZAAR ) 50 MG tablet Take 1 tablet (50 mg total) by mouth daily. 90 tablet 3 05/16/2024   metoprolol  succinate (TOPROL -XL) 100 MG 24 hr tablet Take 1 tablet (100 mg total) by mouth daily. 90 tablet 3 05/16/2024   ondansetron  (ZOFRAN ) 8 MG tablet Take 8 mg by mouth 2 (two) times daily as needed for vomiting or nausea.   Unknown   pyridOXINE  (B-6) 50 MG tablet Take 1 tablet (50 mg total) by mouth daily. 90 tablet 0 05/17/2024   REPATHA  SURECLICK 140 MG/ML SOAJ INJECT 140 MGS SUBCUTANEOUSLY EVERY 14 DAYS 2 mL 12 05/04/2024   [Paused] spironolactone  (ALDACTONE ) 25 MG tablet  Take 25 mg by mouth daily in the afternoon.   Unknown   tamsulosin  (FLOMAX ) 0.4 MG CAPS capsule Take 0.4 mg by mouth daily in the afternoon.   05/16/2024   warfarin (COUMADIN ) 2 MG tablet Take 1 tablet (2 mg total) by mouth See admin instructions. Take 1 tablet (2mg ) by mouth once daily at 1600 on Monday-Friday - Take 2 tablets (4mg ) once daily at 1600 on Saturday and Sunday. 30 tablet 0 05/14/2024   [Paused] cilostazol  (PLETAL ) 100 MG tablet Take 100 mg by mouth daily. (Patient not taking: No sig reported)   Not Taking    Assessment: 77 YOM on AC (warfarin) pte for hx AF and recurrent VTE PTA dosing was 2 mg daily per last [facility] clinic not 05/15/2024  Goal of Therapy:  INR 2-3 Monitor platelets by anticoagulation protocol: Yes   Plan:  Hold coumadin  2/2 supratherapeutic INR INR daily Monitor for signs of bleeding   Gianno Volner BS, PharmD, BCPS Clinical Pharmacist 05/18/2024 8:57 AM  Contact: [phone] after 3 PM

## 2024-05-18 NOTE — Progress Notes (Addendum)
 HD#1 SUBJECTIVE:  Patient Summary: Tony Zhang is a 78 y.o. person living with a history of CAD s/p stent placement, AAA, DVT, HLD, HTN, CHF, PAD, aortic stenosis s/p TAVR, atrial fibrillation on Coumadin , and pancreatitis who presented with sharp abdominal and chest pain of four days duration and admitted for pancreatitis and atelectasis.  Overnight Events: N/A  Interim History:  Patient states he is feeling well overall. Denies nausea, vomiting, or diarrhea. No bowel issues currently. Has been coordinating follow-up with GI regarding the recurrent pancreatitis.   OBJECTIVE:  Vital Signs: Vitals:   05/18/24 0414 05/18/24 0807 05/18/24 0849 05/18/24 1232  BP: 101/86 (!) 141/69 (!) 157/67 (!) 158/74  Pulse: (!) 108 (!) 106 (!) 109 (!) 103  Resp: 20 20 19 20   Temp:  98.1 F (36.7 C)  98.1 F (36.7 C)  TempSrc:  Oral  Oral  SpO2: 96% 96% 95% 95%  Weight:      Height:       Supplemental O2: Nasal Cannula SpO2: 95 % O2 Flow Rate (L/min): 5 L/min  Filed Weights   05/17/24 1011 05/17/24 1836  Weight: 93 kg 99.3 kg     Intake/Output Summary (Last 24 hours) at 05/18/2024 1424 Last data filed at 05/18/2024 1419 Gross per 24 hour  Intake 2738.99 ml  Output 600 ml  Net 2138.99 ml   Net IO Since Admission: 2,138.99 mL [05/18/24 1424]  Physical Exam: Physical Exam General:   Alert,  Well-developed, acute and chronically ill-appearing  Pulses:  Normal pulses noted. Extremities:  Without clubbing or edema. Neurologic:  Alert and  oriented x4;  grossly normal neurologically. Lungs: Decreased breath sounds bilaterally heart: tachycardic  Abdomen: Protuberant, diffuse tenderness across the upper and mid abdomen with guarding, no rebound, bowel sounds are present, no palpable mass or hepatosplenomegaly Patient Lines/Drains/Airways Status     Active Line/Drains/Airways     Name Placement date Placement time Site Days   Peripheral IV 05/17/24 18 G Anterior;Right Forearm 05/17/24   1053  Forearm  1   External Urinary Catheter 05/18/24  1419  --  less than 1            Pertinent labs and imaging:      Latest Ref Rng & Units 05/18/2024    3:53 AM 05/17/2024   10:55 AM 04/16/2024    4:50 AM  CBC  WBC 4.0 - 10.5 K/uL 21.5  21.5  7.0   Hemoglobin 13.0 - 17.0 g/dL 86.1  84.1  85.4   Hematocrit 39.0 - 52.0 % 43.6  49.9  44.8   Platelets 150 - 400 K/uL 326  371  232        Latest Ref Rng & Units 05/18/2024    9:12 AM 05/18/2024    3:53 AM 05/17/2024   10:55 AM  CMP  Glucose 70 - 99 mg/dL  871  880   BUN 8 - 23 mg/dL  36  32   Creatinine 9.38 - 1.24 mg/dL  7.73  7.93   Sodium 864 - 145 mmol/L  139  135   Potassium 3.5 - 5.1 mmol/L  4.5  4.0   Chloride 98 - 111 mmol/L  106  101   CO2 22 - 32 mmol/L  23  19   Calcium  8.9 - 10.3 mg/dL  8.0  8.4   Total Protein 6.5 - 8.1 g/dL 6.0   6.8   Total Bilirubin 0.0 - 1.2 mg/dL 1.2   1.4   Alkaline Phos  38 - 126 U/L 58   58   AST 15 - 41 U/L 24   30   ALT 0 - 44 U/L 13   13     No results found.  ASSESSMENT/PLAN:  Assessment: 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) Active Problems:   Hyperlipidemia   OSA (obstructive sleep apnea)   PAD (peripheral artery disease) (HCC)   AKI (acute kidney injury) (HCC)   Chronic diastolic CHF (congestive heart failure), NYHA class 2 (HCC)   HTN (hypertension)   Leukocytosis   Acute esophagitis   Plan:  #Pancreatitis The etiology of recurrent pancreatitis remains unclear. Triglycerides are normal, and patient has already undergone cholecystectomy; alcohol is not a factor. While the initial episode was suspected to be due to cholelithiasis, choledocholithiasis was never confirmed. Recent MRCP reveals pancreatic ductal dilation and a cystic lesion, concerning for possible pseudocyst, stricture, or underlying pancreatic neoplasm. Plan: -Supportive care: pain control, IV fluids, bowel rest -GI consulted -MRI/MRCP scheduled to reassess pancreas and duct -Full  liquid diet as tolerated -Dilaudid  1 mg IV PRN -Tylenol  650 mg q6h PRN -Lactated Ringer 's at 75 mL/hr   #Esophagitis -GI consulted -IV PPI (Protonix  40 mg) twice daily -He will need EGD this admission-would prefer to wait until his pulmonary status improves.   #Lung atelectasis Secondary to pancreatitis -Incentive spirometry   #AKI Patient has elevated creatinine at 2.26 raised from 2.06, baseline seems to be around 1.1-1.2.  Although we were hydrating the patient for pancreatitis, with a rise in creatinine so because of the history of heart failure it could be cardiorenal syndrome. -One dose of IV Lasix  40   #Leukocytosis White count up to 21.5.  This is likely reactive in the setting of acute pancreatitis. 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  Do not anticipate any procedures.  Will resume home warfarin -Resume home warfarin   CAD History of NSTEMI Status post drug-eluting stent-LAD 08/23 No acute concerns for chest pain during this hospitalization -Resume home atorvastatin  20 mg daily   HFpEF No acute concern for exacerbation.  Patient will need aggressive fluid resuscitation during this acute bout of pancreatitis.  Will monitor volume status closely. - Monitor volume status   Hypertension Holding home antihypertensives at this time in the setting of AKI - Home medication includes metoprolol  succinate 0.5 mg daily - Hold losartan  50 mg daily - Hold home furosemide  20 mg daily - Hold home spironolactone  25 mg daily   PAD Patient has a longstanding history of occlusion of the distal abdominal aorta with reconstitution at the level of the common femoral arteries via the inferior epigastric arteries - At previous admission, patient's home cilostazol  was held - Continue to hold home Cilostazol   Social: Lives with: Alone  Occupation: Former Radio broadcast assistant:  His support is only himself. No other help around for him. He is his own support system.  Level of function: Independent in all ADLs and iADLs PCP: Alberteen Hunter Raddle. PA-C Substance: No tobacco products since 1980s.    Best Practice: Diet: Full Liquid IVF: Fluids: LR, Rate: 75 cc bolus VTE: Warfarin  Code: DNR  Disposition planning: Therapy Recs: None, DME: none Family Contact: live alone DISPO: Anticipated discharge in 5 days to Home pending clinical improvement .  Signature:  Diona Peregoy [facility]  2:24 PM, 05/18/2024  On Call pager [phone]

## 2024-05-18 NOTE — Consult Note (Incomplete)
 SABRA

## 2024-05-19 ENCOUNTER — Inpatient Hospital Stay (HOSPITAL_COMMUNITY)

## 2024-05-19 LAB — CBC
HCT: 40.3 % (ref 39.0–52.0)
Hemoglobin: 12.9 g/dL — ABNORMAL LOW (ref 13.0–17.0)
MCH: 29.6 pg (ref 26.0–34.0)
MCHC: 32 g/dL (ref 30.0–36.0)
MCV: 92.4 fL (ref 80.0–100.0)
Platelets: 312 K/uL (ref 150–400)
RBC: 4.36 MIL/uL (ref 4.22–5.81)
RDW: 16.1 % — ABNORMAL HIGH (ref 11.5–15.5)
WBC: 16.6 K/uL — ABNORMAL HIGH (ref 4.0–10.5)
nRBC: 0 % (ref 0.0–0.2)

## 2024-05-19 LAB — COMPREHENSIVE METABOLIC PANEL WITH GFR
ALT: 16 U/L (ref 0–44)
AST: 30 U/L (ref 15–41)
Albumin: 1.9 g/dL — ABNORMAL LOW (ref 3.5–5.0)
Alkaline Phosphatase: 55 U/L (ref 38–126)
Anion gap: 11 (ref 5–15)
BUN: 32 mg/dL — ABNORMAL HIGH (ref 8–23)
CO2: 21 mmol/L — ABNORMAL LOW (ref 22–32)
Calcium: 7.8 mg/dL — ABNORMAL LOW (ref 8.9–10.3)
Chloride: 104 mmol/L (ref 98–111)
Creatinine, Ser: 1.72 mg/dL — ABNORMAL HIGH (ref 0.61–1.24)
GFR, Estimated: 40 mL/min — ABNORMAL LOW (ref >60)
Glucose, Bld: 137 mg/dL — ABNORMAL HIGH (ref 70–99)
Potassium: 3.7 mmol/L (ref 3.5–5.1)
Sodium: 136 mmol/L (ref 135–145)
Total Bilirubin: 1.1 mg/dL (ref 0.0–1.2)
Total Protein: 5.1 g/dL — ABNORMAL LOW (ref 6.5–8.1)

## 2024-05-19 LAB — PROTIME-INR
INR: 3.8 — ABNORMAL HIGH (ref 0.8–1.2)
Prothrombin Time: 39.4 s — ABNORMAL HIGH (ref 11.4–15.2)

## 2024-05-19 MED ORDER — METOPROLOL SUCCINATE ER 100 MG PO TB24: 100.0000 mg | ORAL_TABLET | Freq: Every day | ORAL | Status: DC

## 2024-05-19 MED ORDER — ONDANSETRON HCL 4 MG/2ML IJ SOLN
4.0000 mg | Freq: Three times a day (TID) | INTRAMUSCULAR | Status: DC | PRN
  Administered 2024-05-19 – 2024-05-23 (×9): 4 mg via INTRAVENOUS
  Filled 2024-05-19 (×9): qty 2

## 2024-05-19 MED ORDER — LACTATED RINGERS IV BOLUS
1000.0000 mL | Freq: Once | INTRAVENOUS | Status: AC
  Administered 2024-05-19: 1000 mL via INTRAVENOUS

## 2024-05-19 MED ORDER — AMIODARONE LOAD VIA INFUSION
150.0000 mg | Freq: Once | INTRAVENOUS | Status: AC
  Administered 2024-05-19: 150 mg via INTRAVENOUS
  Filled 2024-05-19: qty 83.34

## 2024-05-19 MED ORDER — FUROSEMIDE 40 MG PO TABS
40.0000 mg | ORAL_TABLET | Freq: Every day | ORAL | Status: DC
  Administered 2024-05-19 – 2024-05-20 (×2): 40 mg via ORAL
  Filled 2024-05-19 (×2): qty 1

## 2024-05-19 MED ORDER — LACTATED RINGERS IV SOLN: INTRAVENOUS | Status: AC

## 2024-05-19 MED ORDER — ONDANSETRON HCL 4 MG/2ML IJ SOLN
4.0000 mg | Freq: Once | INTRAMUSCULAR | Status: AC
  Administered 2024-05-19: 4 mg via INTRAVENOUS
  Filled 2024-05-19: qty 2

## 2024-05-19 MED ORDER — ENSURE PLUS HIGH PROTEIN PO LIQD
237.0000 mL | Freq: Two times a day (BID) | ORAL | Status: DC
  Administered 2024-05-19 – 2024-05-25 (×6): 237 mL via ORAL

## 2024-05-19 MED ORDER — METOPROLOL TARTRATE 5 MG/5ML IV SOLN
5.0000 mg | Freq: Once | INTRAVENOUS | Status: AC
  Administered 2024-05-19: 5 mg via INTRAVENOUS
  Filled 2024-05-19: qty 5

## 2024-05-19 MED ORDER — FUROSEMIDE 10 MG/ML IJ SOLN: 40.0000 mg | Freq: Every day | INTRAMUSCULAR | Status: DC

## 2024-05-19 MED ORDER — FLUTICASONE FUROATE-VILANTEROL 200-25 MCG/ACT IN AEPB
1.0000 | INHALATION_SPRAY | Freq: Every day | RESPIRATORY_TRACT | Status: DC
  Administered 2024-05-20 – 2024-05-25 (×6): 1 via RESPIRATORY_TRACT
  Filled 2024-05-19: qty 28

## 2024-05-19 MED ORDER — ONDANSETRON HCL 4 MG/2ML IJ SOLN: 4.0000 mg | Freq: Once | INTRAMUSCULAR | Status: DC

## 2024-05-19 MED ORDER — AMIODARONE HCL IN DEXTROSE 360-4.14 MG/200ML-% IV SOLN
30.0000 mg/h | INTRAVENOUS | Status: DC
  Administered 2024-05-20 – 2024-05-21 (×3): 30 mg/h via INTRAVENOUS
  Filled 2024-05-19 (×5): qty 200

## 2024-05-19 MED ORDER — AMIODARONE HCL IN DEXTROSE 360-4.14 MG/200ML-% IV SOLN
60.0000 mg/h | INTRAVENOUS | Status: AC
  Administered 2024-05-19: 60 mg/h via INTRAVENOUS

## 2024-05-19 MED ORDER — ALLOPURINOL 300 MG PO TABS
300.0000 mg | ORAL_TABLET | Freq: Every day | ORAL | Status: DC
  Administered 2024-05-19 – 2024-05-25 (×7): 300 mg via ORAL
  Filled 2024-05-19 (×7): qty 1

## 2024-05-19 MED ORDER — GADOBUTROL 1 MMOL/ML IV SOLN
10.0000 mL | Freq: Once | INTRAVENOUS | Status: AC | PRN
  Administered 2024-05-19: 10 mL via INTRAVENOUS

## 2024-05-19 NOTE — Progress Notes (Addendum)
 Alerted about pt being in AFIB with uncontrolled rates. EKG confirms this, and personally reviewed his tele as well. He does have a diagnosis of paroxysmal atrial fibrillation. He is asymptomatic, denying any chest pain, shortness of breath, or palpitations. Could be in the setting of acute on chronic pancreatitis and dehydration. Will give 1L bolus as he does look dry on exam. Also trialing 5mg  IV metoprolol  dose, however his rates continue to be uncontrolled in the 160-170 range. Will restart his home metoprolol  at this time. He is on anticoagulation with warfarin for his hx of DVT and PE.   ADDENDUM: 3:03AM   Rates still hovering in the 160-170 range, hypotensive now as well at 69/57. He is awake, alert, and orientated. Given 1L of fluid and blood pressure increased to 104/70. Rates still uncontrolled in the 160-170 range as well as in AFIB. Etiology of his rapid blood pressure drop is unclear, he was given metoprolol  5mg  IV but the likelihood of that causing such a drop in BP is unlikely. May be due to sustained tachycardia. Since his blood pressure is better, will start amio bolus and drip.    ADDENDUM: 4:45 AM   Rates much improved now after initiating amiodarone  bolus and drip, in the 110 range now. BP also stable. Will repeat EKG in the AM.    Ansley Stanwood, MD  Internal Medicine Resident, PGY-3

## 2024-05-19 NOTE — Progress Notes (Incomplete)
 Dawnson Tan MD and Saad M

## 2024-05-19 NOTE — Progress Notes (Signed)
 Notified Letha Cheadle MD & Roetta Hood MD at [phone] for irregular heart rate 160-170's and hypotension. EKG performed. Dawson Tan MD & Saad Noorudin MD at bedside. 1 liter LR bolus started and  Lopressor  5mg  IV given. Patient down-trending hypotension. Manual b/p taken. Dawson Tan MD & Saad Noorudin MD notified again. Providers at beside, 150 amio bolus given and started amio gtt.

## 2024-05-19 NOTE — Progress Notes (Signed)
 Patient ID: Tony Zhang, male   DOB: [birth year], 78 y.o.   MRN: [id number]    Progress Note   Subjective  Day # 3 CC; recurrent pancreatitis  Mended on hold  Patient still feeling winded, oxygen has been decreased currently at 3 L and satting at 89 says he feels somewhat short of breath even at rest Abdominal pain about the same, tolerating small amounts of p.o.'s  Tmax 99 4 MRI/MRCP not done as yet  Pro time 39.4/INR 3.8 WBC 16.6 improved/hemoglobin 12.9/ crit 40.3 Sodium 136/potassium 3.7/BUN 32/creatinine 1.72 improved LFTs normal   Objective   Vital signs in last 24 hours: Temp:  [97.8 F (36.6 C)-99.4 F (37.4 C)] 97.8 F (36.6 C) (08/02 1145) Pulse Rate:  [77-166] 86 (08/02 1145) Resp:  [16-35] 28 (08/02 1145) BP: (62-158)/(43-97) 143/54 (08/02 1145) SpO2:  [92 %-97 %] 95 % (08/02 1145) Weight:  [96.1 kg] 96.1 kg (08/02 0403) Last BM Date : 05/17/24 General:    Elderly white male in NAD uncomfortable appearing, some increased work of breathing Heart:  Regular rate and rhythm; no murmurs Lungs: Decreased breath sounds bilateral bases Abdomen:  Soft, protuberant, he remains quite tender across the upper and mid abdomen with guarding. Normal bowel sounds. Extremities:  Without edema. Neurologic:  Alert and oriented,  grossly normal neurologically. Psych:  Cooperative. Normal mood and affect.  Intake/Output from previous day: 08/01 0701 - 08/02 0700 In: 2586.2 [P.O.:1240; I.V.:848.4; IV Piggyback:497.8] Out: 1050 [Urine:1050] Intake/Output this shift: No intake/output data recorded.  Lab Results: Recent Labs    05/17/24 1055 05/18/24 0353 05/19/24 0354  WBC 21.5* 21.5* 16.6*  HGB 15.8 13.8 12.9*  HCT 49.9 43.6 40.3  PLT 371 326 312   BMET Recent Labs    05/17/24 1055 05/18/24 0353 05/19/24 0354  NA 135 139 136  K 4.0 4.5 3.7  CL 101 106 104  CO2 19* 23 21*  GLUCOSE 119* 128* 137*  BUN 32* 36* 32*  CREATININE 2.06* 2.26* 1.72*  CALCIUM  8.4*  8.0* 7.8*   LFT Recent Labs    05/18/24 0912 05/19/24 0354  PROT 6.0* 5.1*  ALBUMIN 2.2* 1.9*  AST 24 30  ALT 13 16  ALKPHOS 58 55  BILITOT 1.2 1.1  BILIDIR 0.4*  --   IBILI 0.8  --    PT/INR Recent Labs    05/18/24 0353 05/19/24 0354  LABPROT 36.0* 39.4*  INR 3.4* 3.8*    Studies/Results: DG Chest 2 View Result Date: 05/19/2024 CLINICAL DATA:  78 year old male with chest pain and kidney a. EXAM: DG CHEST 2V COMPARISON:  CT Chest, Abdomen, and Pelvis /31/2025 and earlier. FINDINGS: Portable AP semi upright view at 0828 hours. Lung volumes appear mildly improved since last month. Stable cardiac size and mediastinal contours. Small bilateral pleural effusions persist. Underlying coarse pulmonary interstitium appears to be chronic. No pneumothorax or pulmonary edema. No air bronchograms. No areas worsening ventilation. Visualized tracheal air column is within normal limits. No acute osseous abnormality identified. IMPRESSION: No significant change in small bilateral pleural effusions superimposed on suspected chronic interstitial lung disease. No new cardiopulmonary abnormality. Electronically Signed   By: VEAR Hurst M.D.   On: 05/19/2024 10:25   CT CHEST ABDOMEN PELVIS WO CONTRAST Result Date: 05/17/2024 CLINICAL DATA:  Chest abdominal pain and hypoxia. History of pancreatitis EXAM: CT CHEST, ABDOMEN AND PELVIS WITHOUT CONTRAST TECHNIQUE: Multidetector CT imaging of the chest, abdomen and pelvis was performed following the standard protocol without IV contrast. RADIATION DOSE  REDUCTION: This exam was performed according to the departmental dose-optimization program which includes automated exposure control, adjustment of the mA and/or kV according to patient size and/or use of iterative reconstruction technique. COMPARISON:  Chest CT dated 04/11/2024. FINDINGS: Evaluation of this exam is limited in the absence of intravenous contrast. CT CHEST FINDINGS Cardiovascular: There is no cardiomegaly  or pericardial effusion. There is 3 vessel coronary vascular calcification. Aortic valve repair. There is advanced atherosclerotic calcification of the thoracic aorta. Mild dilatation of the aortic arch measuring up to 3.9 cm. The central pulmonary arteries are grossly unremarkable. Mediastinum/Nodes: No hilar or mediastinal adenopathy. There is severe inflammatory changes and thickening of the mid and distal esophagus cough new since the prior CT most consistent with esophagitis. Underlying mass is less likely. There is associated mass effect and narrowing of the lumen of the mid to distal esophagus. No fluid collection or abscess. No extraluminal air. Lungs/Pleura: Background of emphysema. Small bilateral pleural effusions and bibasilar compressive atelectasis. Pneumonia is not excluded. No pneumothorax. The central airways are patent. Musculoskeletal: No acute osseous pathology. Degenerative changes of the spine. CT ABDOMEN PELVIS FINDINGS No intra-abdominal free air or free fluid. Hepatobiliary: Small cyst in the right lobe of the liver. No biliary dilatation. Cholecystectomy. Pancreas: Inflammatory changes surrounding the pancreas may be related to acute pancreatitis. Correlation with pancreatic enzymes recommended. No abscess or pseudocyst Spleen: Normal in size without focal abnormality. Adrenals/Urinary Tract: The adrenal glands unremarkable. There is no hydronephrosis or physis on either side. Small bilateral renal cysts. The visualized ureters and urinary bladder appear unremarkable. Stomach/Bowel: Inflammatory changes of the stomach predominantly involving the GE junction and proximal stomach. There is a 15 mm lipoma in the distal small bowel (64/6). There is no bowel obstruction. Appendectomy. Vascular/Lymphatic: Advanced aortoiliac atherosclerotic disease. There is a 3.4 cm infrarenal abdominal aortic aneurysm. The IVC is unremarkable. No portal venous gas. There is no adenopathy. Reproductive: The  prostate gland is mildly enlarged measuring 6 cm in transverse axial diameter. The seminal vesicles are symmetric Other: None Musculoskeletal: Osteopenia with degenerative changes. No acute osseous pathology. IMPRESSION: 1. Severe inflammatory changes and thickening of the mid and distal esophagus most consistent with esophagitis. There is associated mass effect and narrowing of the lumen of the mid to distal esophagus. No fluid collection or abscess. No extraluminal air. 2. Inflammatory changes surrounding the pancreas may be related to acute pancreatitis. Correlation with pancreatic enzymes recommended. No abscess or pseudocyst. 3. Small bilateral pleural effusions and bibasilar compressive atelectasis. Pneumonia is not excluded. 4. 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 Recommend annual imaging followup by CTA or MRA. This recommendation follows 2010 ACCF/AHA/AATS/ACR/ASA/SCA/SCAI/SIR/STS/SVM Guidelines for the Diagnosis and Management of Patients with Thoracic Aortic Disease. Circulation.2010; 121: Z733-z630. Aortic aneurysm NOS (ICD10-I71.9) 5. A 3.4 cm infrarenal abdominal aortic aneurysm. Recommend follow-up ultrasound every 3 years. (Ref.: J Vasc Surg. 2018; 67:2-77 and J Am Coll Radiol 2013;10(10):789-794.) 6. Aortic Atherosclerosis (ICD10-I70.0) and Emphysema (ICD10-J43.9). Electronically Signed   By: Vanetta Chou M.D.   On: 05/17/2024 13:39       Assessment / Plan:    #25 78 year old male with multiple serious comorbidities hospitalized January 2025 with congestive heart failure severe aortic stenosis and underwent TAVR During that hospital stay within 24 to 48 hours of TAVR he developed abdominal pain and hypotension, diagnosed with acute pancreatitis initially felt to be gallstone pancreatitis and on imaging did have 1 stone in the gallbladder, never had any LFT elevation and MRCP  did not demonstrate choledocholithiasis.  Cholecystectomy delayed due to recent TAVR and severity  of pancreatitis, ultimately underwent lap chole 01/17/2024, no IOC  Readmitted 04/11/2024 with recurrent pancreatitis at that time MRI/MRCP showed diffuse inflammatory stranding around the body and tail and also showed a new long segment of prominence of the pancreatic duct within the pancreatic body and tail with cystic change in the proximal body , new mild splenomegaly  Patient recovered after that episode had no ongoing abdominal pain or discomfort then 4 to 5 days prior to this admission developed recurrent abdominal pain this time a bit different with more pain in the epigastrium and lower chest, and sense of shortness of breath.  Noncontrasted CT does show pancreatitis which does not appear severe, no associated fluid collections however he has new what appears to be severe esophagitis in the lower esophagus and at the GE junction where there is some mass effect and inflammatory changes of the stomach.  Unclear at present, fits of his current symptoms are secondary to pancreatitis and how much secondary to the distal esophageal/gastric inflammatory changes Etiology of the recurrent pancreatitis is not clear, not certain that he ever had choledocholithiasis though that was felt most likely etiology for the initial episode given he did have cholelithiasis..  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  A-fib, supratherapeutic INR currently Coumadin  on hold 7 acute kidney injury improved #8 coronary artery disease status post stent  Plan; continue full liquid diet as tolerates, add protein shakes MRI/MRCP ordered, still not done Hold Coumadin  Will need EGD this admission once his INR drifts significantly and hopefully he can have improvement in his pulmonary status prior to consideration of sedation for EGD We will continue to follow with you       Principal Problem:   Acute on chronic pancreatitis  (HCC) Active Problems:   Hyperlipidemia   OSA (obstructive sleep apnea)   PAD (peripheral artery disease) (HCC)   AKI (acute kidney injury) (HCC)   Chronic diastolic CHF (congestive heart failure), NYHA class 2 (HCC)   HTN (hypertension)   Leukocytosis   Acute esophagitis     LOS: 2 days   Darletta Noblett  05/19/2024, 11:53 AM

## 2024-05-19 NOTE — Progress Notes (Signed)
 PHARMACY - ANTICOAGULATION CONSULT NOTE  Pharmacy Consult for warfarin >> heparin  Indication: hx of DVT/PE, afib, TAVR on PTA warfarin  Allergies  Allergen Reactions   Codeine Hives    Patient Measurements: Height: 6' 1 (185.4 cm) Weight: 96.1 kg (211 lb 13.8 oz) IBW/kg (Calculated) : 79.9 HEPARIN  DW (KG): 99.3  Vital Signs: Temp: 97.9 F (36.6 C) (08/02 0400) Temp Source: Oral (08/02 0400) BP: 104/52 (08/02 0500) Pulse Rate: 109 (08/02 0500)  Labs: Recent Labs    05/17/24 1055 05/17/24 1320 05/18/24 0353 05/19/24 0354  HGB 15.8  --  13.8 12.9*  HCT 49.9  --  43.6 40.3  PLT 371  --  326 312  LABPROT 33.1*  --  36.0* 39.4*  INR 3.1*  --  3.4* 3.8*  CREATININE 2.06*  --  2.26* 1.72*  TROPONINIHS 12 11  --   --     Estimated Creatinine Clearance: 43.3 mL/min (A) (by C-G formula based on SCr of 1.72 mg/dL (H)).   Medical History: Past Medical History:  Diagnosis Date   AAA (abdominal aortic aneurysm) (HCC)    Blood dyscrasia    followed by Dr. Cloretta, for clotting issue, has been on Coumadin  for about 20 yrs.     CAD (coronary artery disease)    Chronic kidney disease    COPD (chronic obstructive pulmonary disease) (HCC)    Diabetes mellitus without complication (HCC)    DJD (degenerative joint disease)    Dyslipidemia    Dyspnea    Gout    Malignant hypertension    Peripheral vascular disease (HCC)    Pneumonia 08/18/2013   pt. 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 Wonda and Dr. Lucas   Severe aortic stenosis    Venous thrombosis    Recurrent    Medications:  Warfarin 2mg  PO daily per last anticoag note (7/29)  Assessment: 77 yoM presented to [facility] with recurrent pancreatitis. Pharmacy consulted to dose heparin  while holding warfarin for  inpatient procedure.  -INR supra-therapeutic at 3.8 -CBC stable  Goal of Therapy:  INR 2-3 Heparin  level 0.3-0.7 units/ml Monitor platelets by anticoagulation protocol: Yes   Plan:  -Start heparin  bridge once INR <2 -Start heparin  at 1500 units/hr once INR below goal -8h heparin  level -CBC and heparin  level daily -Follow warfarin restart s/p EGD inpatient   Lynwood Poplar, PharmD, BCPS Clinical Pharmacist 05/19/2024 5:27 AM

## 2024-05-19 NOTE — Plan of Care (Signed)
  Problem: Coping: Goal: Level of anxiety will decrease Outcome: Progressing   Problem: Elimination: Goal: Will not experience complications related to urinary retention Outcome: Progressing   Problem: Pain Managment: Goal: General experience of comfort will improve and/or be controlled Outcome: Progressing   Problem: Safety: Goal: Ability to remain free from injury will improve Outcome: Progressing

## 2024-05-19 NOTE — Progress Notes (Signed)
 Changed.

## 2024-05-19 NOTE — Progress Notes (Signed)
 HD#2 Subjective:   Summary: This is a 78 year old male with past medical history of recurrent pancreatitis, history of CAD status post stent placement, AAA, DVT, aortic stenosis status post TAVR, A-fib on Coumadin  who presents to the emergency department with concerns of sharp abdominal pain and chest pain, and admitted for pancreatitis with Hospital course now complicated by atrial fibrillation with RVR.  Overnight Events: Overnight, patient had atrial fibrillation with RVR with rates into the 160s.  Patient was given metoprolol , became slightly hypotensive, and ultimately required amiodarone  drip.  Rate came down nicely into the low 100s.  Patient evaluated bedside this morning.  He states he is having abdominal pain.  He states he is not eating well.  Able to titrate his oxygen down to 2 L in the room.  Objective:  Vital signs in last 24 hours: Vitals:   05/19/24 0530 05/19/24 0600 05/19/24 0630 05/19/24 0746  BP: (!) 109/52 (!) 108/48 133/61 (!) 119/47  Pulse: (!) 106 (!) 104 96 99  Resp: (!) 25 (!) 26 (!) 35 20  Temp:    98 F (36.7 C)  TempSrc:    Oral  SpO2: 95% 97% 93% 92%  Weight:      Height:       Supplemental O2: Nasal Cannula SpO2: 95 % O2 Flow Rate (L/min): 2 L/min   Physical Exam:  Constitutional: Appearing, resting in bed, in no acute distress Cardiovascular: Tachycardic rate, systolic murmur appreciated Pulmonary/Chest: Increased work of breathing noted on nasal cannula, bibasilar crackles appreciated Abdominal: Soft, nondistended, some tenderness appreciated to epigastric region Extremities: No edema appreciated to bilateral lower extremities  Filed Weights   05/17/24 1011 05/17/24 1836 05/19/24 0403  Weight: 93 kg 99.3 kg 96.1 kg     Intake/Output Summary (Last 24 hours) at 05/19/2024 1040 Last data filed at 05/19/2024 0600 Gross per 24 hour  Intake 1826.21 ml  Output 1050 ml  Net 776.21 ml   Net IO Since Admission: 2,589.44 mL [05/19/24  1040]  Pertinent Labs:    Latest Ref Rng & Units 05/19/2024    3:54 AM 05/18/2024    3:53 AM 05/17/2024   10:55 AM  CBC  WBC 4.0 - 10.5 K/uL 16.6  21.5  21.5   Hemoglobin 13.0 - 17.0 g/dL 87.0  86.1  84.1   Hematocrit 39.0 - 52.0 % 40.3  43.6  49.9   Platelets 150 - 400 K/uL 312  326  371        Latest Ref Rng & Units 05/19/2024    3:54 AM 05/18/2024    9:12 AM 05/18/2024    3:53 AM  CMP  Glucose 70 - 99 mg/dL 862   871   BUN 8 - 23 mg/dL 32   36   Creatinine 9.38 - 1.24 mg/dL 8.27   7.73   Sodium 864 - 145 mmol/L 136   139   Potassium 3.5 - 5.1 mmol/L 3.7   4.5   Chloride 98 - 111 mmol/L 104   106   CO2 22 - 32 mmol/L 21   23   Calcium  8.9 - 10.3 mg/dL 7.8   8.0   Total Protein 6.5 - 8.1 g/dL 5.1  6.0    Total Bilirubin 0.0 - 1.2 mg/dL 1.1  1.2    Alkaline Phos 38 - 126 U/L 55  58    AST 15 - 41 U/L 30  24    ALT 0 - 44 U/L 16  13  Imaging: DG Chest 2 View Result Date: 05/19/2024 CLINICAL DATA:  78 year old male with chest pain and kidney a. EXAM: DG CHEST 2V COMPARISON:  CT Chest, Abdomen, and Pelvis /31/2025 and earlier. FINDINGS: Portable AP semi upright view at 0828 hours. Lung volumes appear mildly improved since last month. Stable cardiac size and mediastinal contours. Small bilateral pleural effusions persist. Underlying coarse pulmonary interstitium appears to be chronic. No pneumothorax or pulmonary edema. No air bronchograms. No areas worsening ventilation. Visualized tracheal air column is within normal limits. No acute osseous abnormality identified. IMPRESSION: No significant change in small bilateral pleural effusions superimposed on suspected chronic interstitial lung disease. No new cardiopulmonary abnormality. Electronically Signed   By: VEAR Hurst M.D.   On: 05/19/2024 10:25    Assessment/Plan:   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) Active Problems:   Hyperlipidemia   OSA (obstructive sleep apnea)   PAD (peripheral artery disease) (HCC)    AKI (acute kidney injury) (HCC)   Chronic diastolic CHF (congestive heart failure), NYHA class 2 (HCC)   HTN (hypertension)   Leukocytosis   Acute esophagitis   Patient Summary: Tony Zhang is a 78 y.o. male with past medical history of recurrent pancreatitis, history of CAD status post stent placement, AAA, DVT, aortic stenosis status post TAVR, A-fib on Coumadin  who presents to the emergency department with concerns of sharp abdominal pain and chest pain, and admitted for pancreatitis with Hospital course now complicated by atrial fibrillation with RVR.  #Recurrent pancreatitis First episode of pancreatitis syncope in January 2025, and second episode in June 2025.  Patient presents with his third episode of pancreatitis this admission.  He has been on continuous fluids.  Clinically, patient is slowly improving, but still needs to be worked up on etiology of pancreatitis.  Patient to get MRCP today.  GI following.  Will need to be cautious with fluids, as yesterday patient had increasing oxygen requirement requiring some diuresis.  Will continue with fluids and diurese as needed. - Continue IV fluids - GI following, appreciate recommendations - Advance diet as tolerated - MRCP today  #Esophagitis CT showed evidence of esophagitis.  Will need to get EGD while patient is here inpatient.  Fortunately, patient is swallowing well and does not have much concerns about this.  Will continue PPI IV twice daily.  Holding warfarin, and will bridge with heparin . -Continue Protonix  40 mg twice daily - GI following, appreciate recommendations -Plan for scoping sometime during hospitalization  #Atrial fibrillation with RVR Overnight went into A-fib with RVR with rates into the 160s.  Currently on amiodarone  drip.  Rate controlled well now into the low 100s.  This is likely exacerbated in setting of pancreatitis. - Continue amiodarone  drip - Heparin  per pharmacy once INR becomes subtherapeutic -  Continue monitor on telemetry - On metoprolol  succinate 100 mg daily, holding at this time, can potentially transition to metoprolol  once patient has improved rates  #AKI  Creatinine at 1.72 down from 2.26 yesterday.  Likely patient needed diuresis and had a component of cardiorenal syndrome.  Will continue with diuresis as patient is getting lots of fluid. -Monitor urine output closely - Continue Lasix  40 mg daily - Hold home losartan  50 mg daily  #CAD #HFpEF Patient does have a history of CAD with drug-eluting stent to LAD on 08/23.  No acute concerns for ACS during hospitalization.  Do have some concern for volume overload in the setting of volume resuscitation.  Will monitor closely. - Continue  home atorvastatin  20 mg daily - Hold home Jardiance  10 mg daily - Strict I's and O's - Daily weights -Given tachypnea today, obtain chest x-ray to evaluate for volume overload -Continue Lasix  40 mg daily  #Aortic stenosis status post TAVR January 2025 No acute concerns at this time.  However patient needs to hold warfarin in anticipation of EGD, so we will need to bridge with heparin .  INR is supratherapeutic at this time. - Monitor INR - Hold warfarin - Heparin  per pharmacy once INR becomes subtherapeutic  #Hypertension Blood pressures currently measuring lower at 109/52.  Holding home anti-hypertensive medications - Hold home losartan  50 mg daily - Hold home spironolactone  25 mg daily - Hold home metoprolol  100 mg daily  #Gout -Can resume home allopurinol  300 mg daily  #COPD Patient has past medical history of COPD.  Home medication includes Symbicort .  No acute concern for exacerbation at this time. - Resume home Symbicort  (hospital formulary believe is Breo) - Albuterol  as needed - Monitor respiratory status  Diet: Liquid diet IVF: LR,100cc/hr VTE: INR supratherapeutic on warfarin, awaiting for INR to become subtherapeutic and then start heparin  bridge Code: DNR/DNI PT/OT  recs: Pending, none.   Dispo: Anticipated discharge to Home in 3 days pending clinical improvement.   Libby Blanch DO Internal Medicine Resident PGY-3 Please contact the on call pager after 5 pm and on weekends at [phone].

## 2024-05-20 DIAGNOSIS — I480 Paroxysmal atrial fibrillation: Secondary | ICD-10-CM | POA: Diagnosis not present

## 2024-05-20 DIAGNOSIS — J9601 Acute respiratory failure with hypoxia: Secondary | ICD-10-CM

## 2024-05-20 DIAGNOSIS — I4891 Unspecified atrial fibrillation: Secondary | ICD-10-CM

## 2024-05-20 DIAGNOSIS — K209 Esophagitis, unspecified without bleeding: Secondary | ICD-10-CM | POA: Diagnosis not present

## 2024-05-20 DIAGNOSIS — K859 Acute pancreatitis without necrosis or infection, unspecified: Secondary | ICD-10-CM | POA: Diagnosis not present

## 2024-05-20 DIAGNOSIS — Z7901 Long term (current) use of anticoagulants: Secondary | ICD-10-CM

## 2024-05-20 DIAGNOSIS — N179 Acute kidney failure, unspecified: Secondary | ICD-10-CM

## 2024-05-20 LAB — PROTIME-INR
INR: 3.2 — ABNORMAL HIGH (ref 0.8–1.2)
Prothrombin Time: 34.3 s — ABNORMAL HIGH (ref 11.4–15.2)

## 2024-05-20 LAB — COMPREHENSIVE METABOLIC PANEL WITH GFR
ALT: 22 U/L (ref 0–44)
AST: 42 U/L — ABNORMAL HIGH (ref 15–41)
Albumin: 1.9 g/dL — ABNORMAL LOW (ref 3.5–5.0)
Alkaline Phosphatase: 61 U/L (ref 38–126)
Anion gap: 12 (ref 5–15)
BUN: 25 mg/dL — ABNORMAL HIGH (ref 8–23)
CO2: 23 mmol/L (ref 22–32)
Calcium: 8.1 mg/dL — ABNORMAL LOW (ref 8.9–10.3)
Chloride: 102 mmol/L (ref 98–111)
Creatinine, Ser: 1.35 mg/dL — ABNORMAL HIGH (ref 0.61–1.24)
GFR, Estimated: 54 mL/min — ABNORMAL LOW (ref >60)
Glucose, Bld: 126 mg/dL — ABNORMAL HIGH (ref 70–99)
Potassium: 3.4 mmol/L — ABNORMAL LOW (ref 3.5–5.1)
Sodium: 137 mmol/L (ref 135–145)
Total Bilirubin: 0.8 mg/dL (ref 0.0–1.2)
Total Protein: 5.7 g/dL — ABNORMAL LOW (ref 6.5–8.1)

## 2024-05-20 LAB — CBC
HCT: 40.7 % (ref 39.0–52.0)
Hemoglobin: 12.8 g/dL — ABNORMAL LOW (ref 13.0–17.0)
MCH: 28.8 pg (ref 26.0–34.0)
MCHC: 31.4 g/dL (ref 30.0–36.0)
MCV: 91.7 fL (ref 80.0–100.0)
Platelets: 334 K/uL (ref 150–400)
RBC: 4.44 MIL/uL (ref 4.22–5.81)
RDW: 16.1 % — ABNORMAL HIGH (ref 11.5–15.5)
WBC: 11.7 K/uL — ABNORMAL HIGH (ref 4.0–10.5)
nRBC: 0 % (ref 0.0–0.2)

## 2024-05-20 MED ORDER — POTASSIUM CHLORIDE CRYS ER 20 MEQ PO TBCR
40.0000 meq | EXTENDED_RELEASE_TABLET | Freq: Once | ORAL | Status: AC
  Administered 2024-05-20: 40 meq via ORAL
  Filled 2024-05-20: qty 2

## 2024-05-20 MED ORDER — DILTIAZEM HCL 30 MG PO TABS
30.0000 mg | ORAL_TABLET | Freq: Four times a day (QID) | ORAL | Status: DC
  Administered 2024-05-20 – 2024-05-23 (×12): 30 mg via ORAL
  Filled 2024-05-20 (×11): qty 1

## 2024-05-20 MED ORDER — METOPROLOL TARTRATE 5 MG/5ML IV SOLN
INTRAVENOUS | Status: AC
  Filled 2024-05-20: qty 10

## 2024-05-20 MED ORDER — METOPROLOL TARTRATE 5 MG/5ML IV SOLN
10.0000 mg | Freq: Once | INTRAVENOUS | Status: AC
  Administered 2024-05-20: 10 mg via INTRAVENOUS

## 2024-05-20 MED ORDER — FUROSEMIDE 10 MG/ML IJ SOLN
40.0000 mg | Freq: Once | INTRAMUSCULAR | Status: AC
  Administered 2024-05-20: 40 mg via INTRAVENOUS
  Filled 2024-05-20: qty 4

## 2024-05-20 MED ORDER — LACTATED RINGERS IV SOLN: INTRAVENOUS | Status: AC

## 2024-05-20 MED ORDER — AMIODARONE IV BOLUS ONLY 150 MG/100ML
150.0000 mg | Freq: Once | INTRAVENOUS | Status: AC
  Administered 2024-05-20: 150 mg via INTRAVENOUS

## 2024-05-20 NOTE — Progress Notes (Incomplete)
 HD#3 SUBJECTIVE:  Patient Summary: Tony Zhang is a 78 y.o. with a pertinent PMH of ***, who presented with *** and admitted for ***.   Overnight Events: yesterday, had another afib with rvr, metoprolol  iv,diltiazem   Interim History: ***  OBJECTIVE:  Vital Signs: Vitals:   05/20/24 1140 05/20/24 1206 05/20/24 1510 05/20/24 1927  BP: 139/61  (!) 120/92 (!) 163/55  Pulse: 92 91 84 93  Resp: 20 11 20 18   Temp: 98.3 F (36.8 C) 98.1 F (36.7 C) 97.9 F (36.6 C) 98 F (36.7 C)  TempSrc: Oral Oral Oral Oral  SpO2: 94% 94% 93% 94%  Weight:      Height:       Supplemental O2: {NAMES:3044014::Room Air,Nasal Cannula,Simple Face Mask,Partial Rebreather,HFNC,Non Rebreather,Venturi Mask,Bag Valve Mask} SpO2: 94 % O2 Flow Rate (L/min): 3 L/min  Filed Weights   05/17/24 1836 05/19/24 0403 05/20/24 0308  Weight: 99.3 kg 96.1 kg 95.8 kg     Intake/Output Summary (Last 24 hours) at 05/20/2024 2039 Last data filed at 05/20/2024 1833 Gross per 24 hour  Intake 2036.67 ml  Output 2950 ml  Net -913.33 ml   Net IO Since Admission: 726.11 mL [05/20/24 2039]  Physical Exam: Physical Exam  Patient Lines/Drains/Airways Status     Active Line/Drains/Airways     Name Placement date Placement time Site Days   Peripheral IV 05/19/24 20 G 1 Left;Posterior Forearm 05/19/24  0335  Forearm  1   Peripheral IV 05/20/24 22 G 1.75 Anterior;Right Forearm 05/20/24  1455  Forearm  less than 1   External Urinary Catheter 05/17/24  1038  --  3            Pertinent labs and imaging:  ***    Latest Ref Rng & Units 05/20/2024    2:49 AM 05/19/2024    3:54 AM 05/18/2024    3:53 AM  CBC  WBC 4.0 - 10.5 K/uL 11.7  16.6  21.5   Hemoglobin 13.0 - 17.0 g/dL 87.1  87.0  86.1   Hematocrit 39.0 - 52.0 % 40.7  40.3  43.6   Platelets 150 - 400 K/uL 334  312  326        Latest Ref Rng & Units 05/20/2024    2:49 AM 05/19/2024    3:54 AM 05/18/2024    9:12 AM  CMP  Glucose 70 - 99 mg/dL  873  862    BUN 8 - 23 mg/dL 25  32    Creatinine 9.38 - 1.24 mg/dL 8.64  8.27    Sodium 864 - 145 mmol/L 137  136    Potassium 3.5 - 5.1 mmol/L 3.4  3.7    Chloride 98 - 111 mmol/L 102  104    CO2 22 - 32 mmol/L 23  21    Calcium  8.9 - 10.3 mg/dL 8.1  7.8    Total Protein 6.5 - 8.1 g/dL 5.7  5.1  6.0   Total Bilirubin 0.0 - 1.2 mg/dL 0.8  1.1  1.2   Alkaline Phos 38 - 126 U/L 61  55  58   AST 15 - 41 U/L 42  30  24   ALT 0 - 44 U/L 22  16  13      No results found.  ASSESSMENT/PLAN:  Assessment: 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) Active Problems:   Hyperlipidemia   OSA (obstructive sleep apnea)   PAD (peripheral artery disease) (HCC)   AKI (acute kidney injury) (  HCC)   Chronic diastolic CHF (congestive heart failure), NYHA class 2 (HCC)   HTN (hypertension)   Leukocytosis   Acute esophagitis   Paroxysmal atrial fibrillation (HCC)   Atrial fibrillation with RVR (HCC)   Acute respiratory failure with hypoxia (HCC)   Plan: #*** ***  #*** ***  #*** ***  Best Practice: Diet: {CHL DISCHARGE DIET:21201} IVF: Fluids: {Meds; iv fluids:31617}, Rate: {NAMES:3044014::None,*** cc/hr x *** hrs,*** cc bolus} VTE:  Code: {NAMES:3044014::Full,DNR,DNI,DNR/DNI,Comfort Care,Unknown}  Disposition planning: Therapy Recs: {NAMES:3044014::None,Pending,CIR,SNF,ALF,LTAC,Home Health}, DME: {Assistive Devices IFZ:80464} Family Contact: ***, {Family:[id number]::to be notified.} DISPO: Anticipated discharge {NAMES:3044014::today,tomorrow,in *** days} to {Discharge Destination:18313::Home} pending {BARRIERS TO DISCHARGE:24135}.  Signature:  Unice Vantassel [facility]  8:39 PM, 05/20/2024  On Call pager [phone]

## 2024-05-20 NOTE — Progress Notes (Signed)
 Patient seen due to return of A-fib with RVR.  In the patient laying in bed without any acute changes in symptoms.  He did note a brief shortness of breath but this has resolved.  On exam patient is in A-fib with RVR with rates up in the 150s.  On exam upper extremities warm and well-perfused and lower extremities warm and well-perfused down to just above the ankles where his feet are cool to the touch.  Confirmed with the patient's nurse that this is not an acute change. On review, no other documentation of cool extremities.  Blood pressure recycled and showed 139/65.  He was given a dose of IV metoprolol  10 mg and had received the second dose of his diltiazem  30 mg p.o.  Rates dropped to the 120s.  Remains in A-fib.  Low suspicion for cardiogenic shock but the patient was given symptoms to alert nursing ASAP 4 including shortness of breath, chest pain, tiredness, and coolness or pain in his extremities. - Continue amiodarone  drip 30 mg/hr, diltiazem  30 mg every 6 hours - Page for any hypotension, persistent increase in heart rate, or any symptoms above  Fairy Pool, DO Internal Medicine Resident, PGY-3 Please contact the on call pager at [phone] for any urgent or emergent needs. 4:18 PM 05/20/2024

## 2024-05-20 NOTE — Progress Notes (Signed)
 Patient ID: Tony Zhang, male   DOB: 06-May-1946, 78 y.o.   MRN: [id number]    Progress Note   Subjective   Day # 4 CC; recurrent acute pancreatitis  Coumadin  on hold  Now in A-fib-cardiology consulted today O2 at 3 L  Labs-pro time 34.3/INR 3.2 WBC 11.7/hemoglobin 12.8/hematocrit 40.7 Sodium 137/potassium 3.4//BUN 25/creatinine 1.35 LFTs within normal limits  MRI/MRCP yesterday-small bilateral pleural effusions and bibasilar atelectasis gallbladder absent no bile duct dilation no definite common bile duct stones, peripancreatic inflammatory changes consistent with pancreatitis normal enhancement no suspicion for pancreatic necrosis, main pancreatic duct appears normal no suspicion for pancreas divisum, stable mild splenomegaly, hiatal hernia noted along with significant edema/inflammation surrounding at the distal esophagus and GE junction, 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  Patient looking and feeling a little bit better today from a abdominal pain perspective, no nausea or vomiting, still requiring IV analgesics.  Breathing about the same    Objective   Vital signs in last 24 hours: Temp:  [97.8 F (36.6 C)-98.4 F (36.9 C)] 98.1 F (36.7 C) (08/03 1206) Pulse Rate:  [91-177] 91 (08/03 1206) Resp:  [11-20] 11 (08/03 1206) BP: (124-147)/(42-68) 139/61 (08/03 1140) SpO2:  [90 %-94 %] 94 % (08/03 1206) Weight:  [95.8 kg] 95.8 kg (08/03 0308) Last BM Date : 05/17/24 General:    Elderly white male in NAD some decreased work of breathing, remains on oxygen at 3 L Heart:  irRegular rate and rhythm; no murmurs Lungs: Respirations even, decreased breath sounds bilateral bases Abdomen:  Soft, protuberant, remains tender across the upper and mid abdomen with guarding no rebound ,normal bowel sounds.  No hepatosplenomegaly Extremities:  Without edema. Neurologic:  Alert and oriented,  grossly normal  neurologically. Psych:  Cooperative. Normal mood and affect.  Intake/Output from previous day: 08/02 0701 - 08/03 0700 In: 2036.7 [I.V.:2036.7] Out: 2050 [Urine:2050] Intake/Output this shift: Total I/O In: -  Out: 750 [Urine:750]  Lab Results: Recent Labs    05/18/24 0353 05/19/24 0354 05/20/24 0249  WBC 21.5* 16.6* 11.7*  HGB 13.8 12.9* 12.8*  HCT 43.6 40.3 40.7  PLT 326 312 334   BMET Recent Labs    05/18/24 0353 05/19/24 0354 05/20/24 0249  NA 139 136 137  K 4.5 3.7 3.4*  CL 106 104 102  CO2 23 21* 23  GLUCOSE 128* 137* 126*  BUN 36* 32* 25*  CREATININE 2.26* 1.72* 1.35*  CALCIUM  8.0* 7.8* 8.1*   LFT Recent Labs    05/18/24 0912 05/19/24 0354 05/20/24 0249  PROT 6.0*   < > 5.7*  ALBUMIN 2.2*   < > 1.9*  AST 24   < > 42*  ALT 13   < > 22  ALKPHOS 58   < > 61  BILITOT 1.2   < > 0.8  BILIDIR 0.4*  --   --   IBILI 0.8  --   --    < > = values in this interval not displayed.   PT/INR Recent Labs    05/19/24 0354 05/20/24 0249  LABPROT 39.4* 34.3*  INR 3.8* 3.2*    Studies/Results: MR ABDOMEN MRCP W WO CONTAST Result Date: 05/19/2024 CLINICAL DATA:  Persistent pancreatitis. EXAM: MRI ABDOMEN WITHOUT AND WITH CONTRAST (INCLUDING MRCP) TECHNIQUE: Multiplanar multisequence MR imaging of the abdomen was performed both before and after the administration of intravenous contrast. Heavily T2-weighted images of the biliary and pancreatic ducts were obtained, and three-dimensional MRCP images  were rendered by post processing. CONTRAST:  10mL GADAVIST  GADOBUTROL  1 MMOL/ML IV SOLN COMPARISON:  CT scan 05/17/2024 and MRI 04/14/2023 FINDINGS: Examination limited by breathing motion artifact. Lower chest: Small bilateral pleural effusions and bibasilar atelectasis. Hepatobiliary: No hepatic lesions or intrahepatic biliary dilatation. The gallbladder is surgically absent. No common bile duct dilatation. No definite common bile duct stones. Pancreas: Peripancreatic  inflammatory changes consistent with pancreatitis but the pancreatic parenchyma appears relatively normal. Normal enhancement without findings suspicious for pancreatic necrosis. Normal caliber and course of the main pancreatic duct. No findings suspicious for pancreatic divisum. No pancreatic or peripancreatic pseudocysts. Spleen: Stable mild splenomegaly. No splenic lesions. Stable moderate-sized accessory spleen. Adrenals/Urinary Tract: The adrenal glands and kidneys are stable. Bilateral renal cysts are unchanged. Stomach/Bowel: The stomach is moderately distended. Hiatal hernia noted along with significant inflammation/edema surrounding the distal esophagus and GE junction region. The duodenum, visualized small bowel and visualized colon grossly. 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 No abdominal adenopathy. Other: Residual inflammations/edema in the mesentery and retroperitoneum and in the perinephric spaces. No discrete fluid collection to suggest abscess, hematoma or pseudocyst. Musculoskeletal: No significant bony findings. IMPRESSION: 1. Peripancreatic inflammatory changes consistent with pancreatitis but no findings suspicious for pancreatic necrosis. Normal caliber and course of the main pancreatic duct. No findings suspicious for pancreatic divisum. 2. Status post cholecystectomy. No common bile duct dilatation or definite common bile duct stones. 3. 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 4. Small bilateral pleural effusions and bibasilar atelectasis. Electronically Signed   By: MYRTIS Stammer M.D.   On: 05/19/2024 18:00   MR ABDOMEN MRCP WO CONTRAST Result Date: 05/19/2024 CLINICAL DATA:  Persistent pancreatitis. EXAM:  MRI ABDOMEN WITHOUT AND WITH CONTRAST (INCLUDING MRCP) TECHNIQUE: Multiplanar multisequence MR imaging of the abdomen was performed both before and after the administration of intravenous contrast. Heavily T2-weighted images of the biliary and pancreatic ducts were obtained, and three-dimensional MRCP images were rendered by post processing. CONTRAST:  10mL GADAVIST  GADOBUTROL  1 MMOL/ML IV SOLN COMPARISON:  CT scan 05/17/2024 and MRI 04/14/2023 FINDINGS: Examination limited by breathing motion artifact. Lower chest: Small bilateral pleural effusions and bibasilar atelectasis. Hepatobiliary: No hepatic lesions or intrahepatic biliary dilatation. The gallbladder is surgically absent. No common bile duct dilatation. No definite common bile duct stones. Pancreas: Peripancreatic inflammatory changes consistent with pancreatitis but the pancreatic parenchyma appears relatively normal. Normal enhancement without findings suspicious for pancreatic necrosis. Normal caliber and course of the main pancreatic duct. No findings suspicious for pancreatic divisum. No pancreatic or peripancreatic pseudocysts. Spleen: Stable mild splenomegaly. No splenic lesions. Stable moderate-sized accessory spleen. Adrenals/Urinary Tract: The adrenal glands and kidneys are stable. Bilateral renal cysts are unchanged. Stomach/Bowel: The stomach is moderately distended. Hiatal hernia noted along with significant inflammation/edema surrounding the distal esophagus and GE junction region. The duodenum, visualized small bowel and visualized colon grossly. 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 No abdominal adenopathy. Other: Residual inflammations/edema in the mesentery and retroperitoneum and in the perinephric spaces. No discrete fluid collection to suggest abscess, hematoma or pseudocyst.  Musculoskeletal: No significant bony findings. IMPRESSION: 1. Peripancreatic inflammatory changes consistent with pancreatitis but no findings suspicious for pancreatic necrosis. Normal caliber and course of the main pancreatic duct. No findings  suspicious for pancreatic divisum. 2. Status post cholecystectomy. No common bile duct dilatation or definite common bile duct stones. 3. 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 4. Small bilateral pleural effusions and bibasilar atelectasis. Electronically Signed   By: MYRTIS Stammer M.D.   On: 05/19/2024 18:00   DG Chest 2 View Result Date: 05/19/2024 CLINICAL DATA:  78 year old male with chest pain and kidney a. EXAM: DG CHEST 2V COMPARISON:  CT Chest, Abdomen, and Pelvis /31/2025 and earlier. FINDINGS: Portable AP semi upright view at 0828 hours. Lung volumes appear mildly improved since last month. Stable cardiac size and mediastinal contours. Small bilateral pleural effusions persist. Underlying coarse pulmonary interstitium appears to be chronic. No pneumothorax or pulmonary edema. No air bronchograms. No areas worsening ventilation. Visualized tracheal air column is within normal limits. No acute osseous abnormality identified. IMPRESSION: No significant change in small bilateral pleural effusions superimposed on suspected chronic interstitial lung disease. No new cardiopulmonary abnormality. Electronically Signed   By: VEAR Hurst M.D.   On: 05/19/2024 10:25       Assessment / Plan:    #78 78 year old male with multiple serious comorbidities who was hospitalized January 2025 with congestive heart failure severe aortic stenosis and underwent TAVR.  During that initial stay within 24 to 48 hours of TAVR he developed acute abdominal pain and hypotension, was diagnosed with acute pancreatitis initially felt to be gallstone induced as on  imaging did have a stone in the gallbladder though he never had any LFT elevation and MRCP did not show choledocholithiasis. Cholecystectomy was delayed and ultimately underwent lap chole 01/17/2024, no IOC Readmitted 04/11/2024 with recurrent pancreatitis at that time MRI/MRCP showed diffuse inflammatory stranding and a new long segment of prominence of the pancreatic duct within the body and tail with cystic change in the proximal body and new mild splenomegaly. He feels that he completely recovered from that episode and was not having any ongoing abdominal pain then 4 to 5 days prior to admit this time developed recurrent abdominal pain a bit different with more pain in the epigastrium and lower chest and sense of shortness of breath.  The imaging definitely shows recurrent pancreatitis and what appears to be severe esophagitis in the lower esophagus and at the GE junction where there is some mass effect and inflammatory changes of the stomach.  MRI/MRCP yesterday-stable peripancreatic inflammatory changes consistent with pancreatitis normal course and caliber of the main pancreatic duct no pancreas divisum, no necrosis, no ductal dilation, no mass. He looks a little bit better today, his leukocytosis is resolving   We do not have a definite etiology for his recurrent episodes of pancreatitis, have discussed with advanced biliary/Dr. Mansouraty, and will plan for EUS  #2 significant distal esophageal edema/thickening-will need EGD this admission, waiting for INR to drift  #3 chronic anticoagulation-on Coumadin  was supratherapeutic-Coumadin  has been on hold but no significant decline as yet  #4 recurrent atrial fibrillation today #5 small pleural effusions-patient also with COPD, has been requiring oxygen here, may have some third spacing secondary to pancreatitis  #6 acute kidney injury improved #7.  Coronary artery disease status post prior stent  Plan continue full liquid diet, can try  advancing to low-fat soft as tolerates, continue protein shakes Continue to hold Coumadin  Twice daily PPI Will plan for EGD once INR 2 or below.  Possibly may be able to do EUS at that same setting (Dr.  Mansouraty has some availability on Tuesday afternoon-patient would need 2-day trip to [facility])       Principal Problem:   Acute on chronic pancreatitis [facility]) Active Problems:   Hyperlipidemia   OSA (obstructive sleep apnea)   PAD (peripheral artery disease) (HCC)   AKI (acute kidney injury) (HCC)   Chronic diastolic CHF (congestive heart failure), NYHA class 2 (HCC)   HTN (hypertension)   Leukocytosis   Acute esophagitis   Paroxysmal atrial fibrillation (HCC)     LOS: 3 days   Ahmani Prehn EsterwoodPA-C  05/20/2024, 1:56 PM

## 2024-05-20 NOTE — Consult Note (Addendum)
 CARDIOLOGY CONSULT NOTE       Patient ID: Tony Zhang MRN: [id number] DOB/AGE: 1945-10-23 78 y.o.  Admit date: 05/17/2024 Referring Physician: Tobie Primary Physician: Hunter Mickey Browner, PA-C Primary Cardiologist: Croitoru Reason for Consultation: PAF  Principal Problem:   Acute on chronic pancreatitis [facility]) Active Problems:   Hyperlipidemia   OSA (obstructive sleep apnea)   PAD (peripheral artery disease) (HCC)   AKI (acute kidney injury) (HCC)   Chronic diastolic CHF (congestive heart failure), NYHA class 2 (HCC)   HTN (hypertension)   Leukocytosis   Acute esophagitis   PAF (paroxysmal atrial fibrillation) (HCC)   HPI:78 y.o. male with a history of CAD (NSTEMI and DES-LAD), AAA, chronic distal aortic occlusion, DVT, HLD, HTN, chronic diastolic CHF, PAD, AS s/p TAVR, and atrial fibrillation    Admitted 7/31 with abdominal pain and recurrent pancreatitis. Ask to see for PAF with elevated rates. CT with inflammatory changes around pancreas ? Acute pancreatitis, esophagitis. He denies ETOH and has GB removed. WBC elevate 21.5 Lipase elevated 293 LFTls normal except TB 1.4. On admission was in ST and yesterday had PAF/flutter rates 160. He is on coumadin  with Rx INR 3.4 2 days ago. He does not complain of dyspnea, palpitations or chest pain. Has tender abdomen. This am he has converted to NSR rates 90's Monitor done 05/04/24 showed 7% PAF burden. Echo 6/26 with EF 60-65% Mild LAE TAVR valve with 26 mm Sapien no PVL mean gradient 5 mmHg TAVR implant for AS was 11/08/23. He is normally on Toprol  100 mg daily which has not been started in hospital. CHADVASC 5   ROS All other systems reviewed and negative except as noted above  Past Medical History:  Diagnosis Date   AAA (abdominal aortic aneurysm) (HCC)    Blood dyscrasia    followed by Dr. Cloretta, for clotting issue, has been on Coumadin  for about 20 yrs.     CAD (coronary artery disease)    Chronic kidney disease    COPD  (chronic obstructive pulmonary disease) (HCC)    Diabetes mellitus without complication (HCC)    DJD (degenerative joint disease)    Dyslipidemia    Dyspnea    Gout    Malignant hypertension    Peripheral vascular disease (HCC)    Pneumonia 08/18/2013   pt. 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 Wonda and Dr. Lucas   Severe aortic stenosis    Venous thrombosis    Recurrent    Family History  Problem Relation Age of Onset   CAD Father 41   Liver cancer Brother    CAD Sister     Social History   Socioeconomic History   Marital status: Widowed    Spouse name: Not on file   Number of children: 0   Years of education: Not on file   Highest education level: 6th grade  Occupational History    Comment: semi retired  Tobacco Use   Smoking status: Former    Current packs/day: 0.00    Types: Cigarettes    Quit date: 10/18/1992    Years since quitting: 31.6   Smokeless tobacco: Never  Vaping Use   Vaping status: Never Used  Substance and Sexual Activity   Alcohol use: No   Drug use: No  Sexual activity: Not on file  Other Topics Concern   Not on file  Social History Narrative   Not on file   Social Drivers of Health   Financial Resource Strain: Low Risk  (06/04/2022)   Overall Financial Resource Strain (CARDIA)    Difficulty of Paying Living Expenses: Not hard at all  Food Insecurity: No Food Insecurity (05/17/2024)   Hunger Vital Sign    Worried About Running Out of Food in the Last Year: Never true    Ran Out of Food in the Last Year: Never true  Transportation Needs: No Transportation Needs (05/17/2024)   PRAPARE - Administrator, Civil Service (Medical): No    Lack of Transportation (Non-Medical): No  Physical Activity: Not on file  Stress: Not on  file  Social Connections: Socially Isolated (05/17/2024)   Social Connection and Isolation Panel    Frequency of Communication with Friends and Family: Once a week    Frequency of Social Gatherings with Friends and Family: Once a week    Attends Religious Services: Never    Database administrator or Organizations: No    Attends Banker Meetings: Never    Marital Status: Widowed  Intimate Partner Violence: Not At Risk (05/17/2024)   Humiliation, Afraid, Rape, and Kick questionnaire    Fear of Current or Ex-Partner: No    Emotionally Abused: No    Physically Abused: No    Sexually Abused: No    Past Surgical History:  Procedure Laterality Date   APPENDECTOMY     BACK SURGERY     CHOLECYSTECTOMY N/A 01/17/2024   Procedure: LAPAROSCOPIC CHOLECYSTECTOMY WITH ICG;  Surgeon: Ebbie Cough, MD;  Location: [facility] OR;  Service: General;  Laterality: N/A;   COLONOSCOPY N/A 04/13/2015   Procedure: COLONOSCOPY;  Surgeon: Lynwood Bohr, MD;  Location: [facility] ENDOSCOPY;  Service: Endoscopy;  Laterality: N/A;   CORONARY STENT INTERVENTION N/A 06/04/2022   Procedure: CORONARY STENT INTERVENTION;  Surgeon: Dann Candyce RAMAN, MD;  Location: [facility] INVASIVE CV LAB;  Service: Cardiovascular;  Laterality: N/A;   EMPYEMA DRAINAGE N/A 11/25/2014   Procedure: EMPYEMA DRAINAGE;  Surgeon: Dallas KATHEE Jude, MD;  Location: [facility] OR;  Service: Thoracic;  Laterality: N/A;   ESOPHAGOGASTRODUODENOSCOPY N/A 04/10/2015   Procedure: ESOPHAGOGASTRODUODENOSCOPY (EGD);  Surgeon: Jerrell Sol, MD;  Location: [facility] ENDOSCOPY;  Service: Endoscopy;  Laterality: N/A;   EYE SURGERY     both eyes, cataracts removed, denies lens implants   FLEXIBLE SIGMOIDOSCOPY N/A 04/12/2015   Procedure: FLEXIBLE SIGMOIDOSCOPY;  Surgeon: Lynwood Bohr, MD;  Location: [facility] ENDOSCOPY;  Service: Endoscopy;  Laterality: N/A;   HERNIA REPAIR     LEFT HEART CATH AND CORONARY ANGIOGRAPHY N/A 06/04/2022   Procedure: LEFT HEART CATH AND CORONARY  ANGIOGRAPHY;  Surgeon: Dann Candyce RAMAN, MD;  Location: [facility] INVASIVE CV LAB;  Service: Cardiovascular;  Laterality: N/A;   RIGHT HEART CATH AND CORONARY ANGIOGRAPHY N/A 09/28/2023   Procedure: RIGHT HEART CATH AND CORONARY ANGIOGRAPHY;  Surgeon: Wonda Sharper, MD;  Location: [facility] INVASIVE CV LAB;  Service: Cardiovascular;  Laterality: N/A;   SHOULDER SURGERY Right    TRANSCATHETER AORTIC VALVE REPLACEMENT,SUBCLAVIAN Left 11/08/2023   Procedure: Transcatheter Aortic Valve Replacement-Subclavian;  Surgeon: Wonda Sharper, MD;  Location: [facility] INVASIVE CV LAB;  Service: Cardiovascular;  Laterality: Left;   TRANSESOPHAGEAL ECHOCARDIOGRAM (CATH LAB) N/A 11/08/2023   Procedure: TRANSESOPHAGEAL ECHOCARDIOGRAM;  Surgeon: Wonda Sharper, MD;  Location: [facility] INVASIVE CV LAB;  Service: Cardiovascular;  Laterality: N/A;   VIDEO ASSISTED  THORACOSCOPY Right 11/25/2014   Procedure: VIDEO ASSISTED THORACOSCOPY;  Surgeon: Dallas KATHEE Jude, MD;  Location: [facility] OR;  Service: Thoracic;  Laterality: Right;   VIDEO BRONCHOSCOPY N/A 11/25/2014   Procedure: VIDEO BRONCHOSCOPY;  Surgeon: Dallas KATHEE Jude, MD;  Location: [facility] OR;  Service: Thoracic;  Laterality: N/A;      Current Facility-Administered Medications:    acetaminophen  (TYLENOL ) tablet 650 mg, 650 mg, Oral, Q6H PRN, 650 mg at 05/19/24 0848 **OR** acetaminophen  (TYLENOL ) suppository 650 mg, 650 mg, Rectal, Q6H PRN, Tobie Gaines, DO   albuterol  (PROVENTIL ) (2.5 MG/3ML) 0.083% nebulizer solution 2.5 mg, 2.5 mg, Inhalation, Q4H PRN, Tobie Gaines, DO, 2.5 mg at 05/19/24 0859   allopurinol  (ZYLOPRIM ) tablet 300 mg, 300 mg, Oral, Daily, Tobie, Amar, DO, 300 mg at 05/20/24 0804   [COMPLETED] amiodarone  (NEXTERONE ) 1.8 mg/mL load via infusion 150 mg, 150 mg, Intravenous, Once, 150 mg at 05/19/24 0340 **FOLLOWED BY** [EXPIRED] amiodarone  (NEXTERONE  PREMIX) 360-4.14 MG/200ML-% (1.8 mg/mL) IV infusion, 60 mg/hr, Intravenous, Continuous, Last Rate: 33.3 mL/hr at 05/19/24 0600, 60  mg/hr at 05/19/24 0600 **FOLLOWED BY** amiodarone  (NEXTERONE  PREMIX) 360-4.14 MG/200ML-% (1.8 mg/mL) IV infusion, 30 mg/hr, Intravenous, Continuous, Nooruddin, Saad, MD, Last Rate: 16.67 mL/hr at 05/20/24 0518, 30 mg/hr at 05/20/24 0518   atorvastatin  (LIPITOR) tablet 20 mg, 20 mg, Oral, Daily, Tobie, Amar, DO, 20 mg at 05/20/24 0804   feeding supplement (ENSURE PLUS HIGH PROTEIN) liquid 237 mL, 237 mL, Oral, BID BM, Eben Reyes BROCKS, MD, 237 mL at 05/19/24 0850   fluticasone  furoate-vilanterol (BREO ELLIPTA ) 200-25 MCG/ACT 1 puff, 1 puff, Inhalation, Daily, Tobie Gaines, DO, 1 puff at 05/20/24 0757   furosemide  (LASIX ) tablet 40 mg, 40 mg, Oral, Daily, Tobie, Amar, DO, 40 mg at 05/20/24 0804   HYDROmorphone  (DILAUDID ) injection 1 mg, 1 mg, Intravenous, Q3H PRN, Tobie Gaines, DO, 1 mg at 05/20/24 0139   ondansetron  (ZOFRAN ) injection 4 mg, 4 mg, Intravenous, Q8H PRN, Lyles, Elliott, DO, 4 mg at 05/19/24 1645   pantoprazole  (PROTONIX ) injection 40 mg, 40 mg, Intravenous, Q12H, Esterwood, Amy S, PA-C, 40 mg at 05/20/24 0804   polyethylene glycol (MIRALAX  / GLYCOLAX ) packet 17 g, 17 g, Oral, Daily PRN, Tobie Gaines, DO  allopurinol   300 mg Oral Daily   atorvastatin   20 mg Oral Daily   feeding supplement  237 mL Oral BID BM   fluticasone  furoate-vilanterol  1 puff Inhalation Daily   furosemide   40 mg Oral Daily   pantoprazole  (PROTONIX ) IV  40 mg Intravenous Q12H    amiodarone  30 mg/hr (05/20/24 0518)    Physical Exam: Blood pressure (!) 124/42, pulse (!) 177, temperature 98.3 F (36.8 C), temperature source Oral, resp. rate 20, height 6' 1 (1.854 m), weight 95.8 kg, SpO2 92%.    Chronically ill male Exp wheezing on lung exam SEM through TAVR valve no AR Tender in epigastric area not distended no rebound No edema   Labs:   Lab Results  Component Value Date   WBC 11.7 (H) 05/20/2024   HGB 12.8 (L) 05/20/2024   HCT 40.7 05/20/2024   MCV 91.7 05/20/2024   PLT 334 05/20/2024    Recent  Labs  Lab 05/20/24 0249  NA 137  K 3.4*  CL 102  CO2 23  BUN 25*  CREATININE 1.35*  CALCIUM  8.1*  PROT 5.7*  BILITOT 0.8  ALKPHOS 61  ALT 22  AST 42*  GLUCOSE 126*   Lab Results  Component Value Date   CKTOTAL 56 02/27/2023  CKMB 1.8 02/21/2010   TROPONINI 0.02        NO INDICATION OF MYOCARDIAL INJURY. 02/21/2010    Lab Results  Component Value Date   CHOL 74 05/17/2024   CHOL 78 04/12/2024   CHOL 98 (L) 09/22/2023   Lab Results  Component Value Date   HDL 19 (L) 05/17/2024   HDL 37 (L) 04/12/2024   HDL 39 (L) 09/22/2023   Lab Results  Component Value Date   LDLCALC 35 05/17/2024   LDLCALC 9 04/12/2024   LDLCALC 31 09/22/2023   Lab Results  Component Value Date   TRIG 101 05/17/2024   TRIG 159 (H) 04/12/2024   TRIG 172 (H) 09/22/2023   Lab Results  Component Value Date   CHOLHDL 3.9 05/17/2024   CHOLHDL 2.1 04/12/2024   CHOLHDL 2.5 09/22/2023   No results found for: LDLDIRECT    Radiology: MR ABDOMEN MRCP W WO CONTAST Result Date: 05/19/2024 CLINICAL DATA:  Persistent pancreatitis. EXAM: MRI ABDOMEN WITHOUT AND WITH CONTRAST (INCLUDING MRCP) TECHNIQUE: Multiplanar multisequence MR imaging of the abdomen was performed both before and after the administration of intravenous contrast. Heavily T2-weighted images of the biliary and pancreatic ducts were obtained, and three-dimensional MRCP images were rendered by post processing. CONTRAST:  10mL GADAVIST  GADOBUTROL  1 MMOL/ML IV SOLN COMPARISON:  CT scan 05/17/2024 and MRI 04/14/2023 FINDINGS: Examination limited by breathing motion artifact. Lower chest: Small bilateral pleural effusions and bibasilar atelectasis. Hepatobiliary: No hepatic lesions or intrahepatic biliary dilatation. The gallbladder is surgically absent. No common bile duct dilatation. No definite common bile duct stones. Pancreas: Peripancreatic inflammatory changes consistent with pancreatitis but the pancreatic parenchyma appears relatively  normal. Normal enhancement without findings suspicious for pancreatic necrosis. Normal caliber and course of the main pancreatic duct. No findings suspicious for pancreatic divisum. No pancreatic or peripancreatic pseudocysts. Spleen: Stable mild splenomegaly. No splenic lesions. Stable moderate-sized accessory spleen. Adrenals/Urinary Tract: The adrenal glands and kidneys are stable. Bilateral renal cysts are unchanged. Stomach/Bowel: The stomach is moderately distended. Hiatal hernia noted along with significant inflammation/edema surrounding the distal esophagus and GE junction region. The duodenum, visualized small bowel and visualized colon grossly. 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 No abdominal adenopathy. Other: Residual inflammations/edema in the mesentery and retroperitoneum and in the perinephric spaces. No discrete fluid collection to suggest abscess, hematoma or pseudocyst. Musculoskeletal: No significant bony findings. IMPRESSION: 1. Peripancreatic inflammatory changes consistent with pancreatitis but no findings suspicious for pancreatic necrosis. Normal caliber and course of the main pancreatic duct. No findings suspicious for pancreatic divisum. 2. Status post cholecystectomy. No common bile duct dilatation or definite common bile duct stones. 3. 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 4. Small bilateral pleural effusions and bibasilar atelectasis. Electronically Signed   By: MYRTIS Stammer M.D.   On: 05/19/2024 18:00   MR ABDOMEN MRCP WO CONTRAST Result Date: 05/19/2024 CLINICAL DATA:  Persistent pancreatitis. EXAM: MRI ABDOMEN WITHOUT AND WITH CONTRAST (INCLUDING MRCP) TECHNIQUE: Multiplanar multisequence MR  imaging of the abdomen was performed both before and after the administration of intravenous contrast. Heavily T2-weighted images of the biliary and pancreatic ducts were obtained, and three-dimensional MRCP images were rendered by post processing. CONTRAST:  10mL GADAVIST  GADOBUTROL  1 MMOL/ML IV SOLN COMPARISON:  CT scan 05/17/2024 and MRI 04/14/2023 FINDINGS: Examination limited by breathing motion artifact.  Lower chest: Small bilateral pleural effusions and bibasilar atelectasis. Hepatobiliary: No hepatic lesions or intrahepatic biliary dilatation. The gallbladder is surgically absent. No common bile duct dilatation. No definite common bile duct stones. Pancreas: Peripancreatic inflammatory changes consistent with pancreatitis but the pancreatic parenchyma appears relatively normal. Normal enhancement without findings suspicious for pancreatic necrosis. Normal caliber and course of the main pancreatic duct. No findings suspicious for pancreatic divisum. No pancreatic or peripancreatic pseudocysts. Spleen: Stable mild splenomegaly. No splenic lesions. Stable moderate-sized accessory spleen. Adrenals/Urinary Tract: The adrenal glands and kidneys are stable. Bilateral renal cysts are unchanged. Stomach/Bowel: The stomach is moderately distended. Hiatal hernia noted along with significant inflammation/edema surrounding the distal esophagus and GE junction region. The duodenum, visualized small bowel and visualized colon grossly. 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 No abdominal adenopathy. Other: Residual inflammations/edema in the mesentery and retroperitoneum and in the perinephric spaces. No discrete fluid collection to suggest abscess, hematoma or pseudocyst. Musculoskeletal: No significant bony findings. IMPRESSION: 1. Peripancreatic inflammatory changes  consistent with pancreatitis but no findings suspicious for pancreatic necrosis. Normal caliber and course of the main pancreatic duct. No findings suspicious for pancreatic divisum. 2. Status post cholecystectomy. No common bile duct dilatation or definite common bile duct stones. 3. 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 4. Small bilateral pleural effusions and bibasilar atelectasis. Electronically Signed   By: MYRTIS Stammer M.D.   On: 05/19/2024 18:00   DG Chest 2 View Result Date: 05/19/2024 CLINICAL DATA:  78 year old male with chest pain and kidney a. EXAM: DG CHEST 2V COMPARISON:  CT Chest, Abdomen, and Pelvis /31/2025 and earlier. FINDINGS: Portable AP semi upright view at 0828 hours. Lung volumes appear mildly improved since last month. Stable cardiac size and mediastinal contours. Small bilateral pleural effusions persist. Underlying coarse pulmonary interstitium appears to be chronic. No pneumothorax or pulmonary edema. No air bronchograms. No areas worsening ventilation. Visualized tracheal air column is within normal limits. No acute osseous abnormality identified. IMPRESSION: No significant change in small bilateral pleural effusions superimposed on suspected chronic interstitial lung disease. No new cardiopulmonary abnormality. Electronically Signed   By: VEAR Hurst M.D.   On: 05/19/2024 10:25   CT CHEST ABDOMEN PELVIS WO CONTRAST Result Date: 05/17/2024 CLINICAL DATA:  Chest abdominal pain and hypoxia. History of pancreatitis EXAM: CT CHEST, ABDOMEN AND PELVIS WITHOUT CONTRAST TECHNIQUE: Multidetector CT imaging of the chest, abdomen and pelvis was performed following the standard protocol without IV contrast. RADIATION DOSE REDUCTION: This exam was performed according to the departmental dose-optimization program which includes automated exposure control, adjustment  of the mA and/or kV according to patient size and/or use of iterative reconstruction technique. COMPARISON:  Chest CT dated 04/11/2024. FINDINGS: Evaluation of this exam is limited in the absence of intravenous contrast. CT CHEST FINDINGS Cardiovascular: There is no cardiomegaly or pericardial effusion. There is 3 vessel coronary vascular calcification. Aortic valve repair. There is advanced atherosclerotic calcification of the thoracic aorta. Mild dilatation of the aortic arch measuring up to 3.9 cm. The central pulmonary arteries are grossly unremarkable. Mediastinum/Nodes: No hilar or mediastinal adenopathy. There is severe inflammatory changes and thickening of the mid and distal esophagus cough new since the prior CT most consistent with esophagitis. Underlying mass is less likely. There is associated mass effect and narrowing of the lumen of the mid  to distal esophagus. No fluid collection or abscess. No extraluminal air. Lungs/Pleura: Background of emphysema. Small bilateral pleural effusions and bibasilar compressive atelectasis. Pneumonia is not excluded. No pneumothorax. The central airways are patent. Musculoskeletal: No acute osseous pathology. Degenerative changes of the spine. CT ABDOMEN PELVIS FINDINGS No intra-abdominal free air or free fluid. Hepatobiliary: Small cyst in the right lobe of the liver. No biliary dilatation. Cholecystectomy. Pancreas: Inflammatory changes surrounding the pancreas may be related to acute pancreatitis. Correlation with pancreatic enzymes recommended. No abscess or pseudocyst Spleen: Normal in size without focal abnormality. Adrenals/Urinary Tract: The adrenal glands unremarkable. There is no hydronephrosis or physis on either side. Small bilateral renal cysts. The visualized ureters and urinary bladder appear unremarkable. Stomach/Bowel: Inflammatory changes of the stomach predominantly involving the GE junction and proximal stomach. There is a 15 mm lipoma in the distal  small bowel (64/6). There is no bowel obstruction. Appendectomy. Vascular/Lymphatic: Advanced aortoiliac atherosclerotic disease. There is a 3.4 cm infrarenal abdominal aortic aneurysm. The IVC is unremarkable. No portal venous gas. There is no adenopathy. Reproductive: The prostate gland is mildly enlarged measuring 6 cm in transverse axial diameter. The seminal vesicles are symmetric Other: None Musculoskeletal: Osteopenia with degenerative changes. No acute osseous pathology. IMPRESSION: 1. Severe inflammatory changes and thickening of the mid and distal esophagus most consistent with esophagitis. There is associated mass effect and narrowing of the lumen of the mid to distal esophagus. No fluid collection or abscess. No extraluminal air. 2. Inflammatory changes surrounding the pancreas may be related to acute pancreatitis. Correlation with pancreatic enzymes recommended. No abscess or pseudocyst. 3. Small bilateral pleural effusions and bibasilar compressive atelectasis. Pneumonia is not excluded. 4. 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 Recommend annual imaging followup by CTA or MRA. This recommendation follows 2010 ACCF/AHA/AATS/ACR/ASA/SCA/SCAI/SIR/STS/SVM Guidelines for the Diagnosis and Management of Patients with Thoracic Aortic Disease. Circulation.2010; 121: Z733-z630. Aortic aneurysm NOS (ICD10-I71.9) 5. A 3.4 cm infrarenal abdominal aortic aneurysm. Recommend follow-up ultrasound every 3 years. (Ref.: J Vasc Surg. 2018; 67:2-77 and J Am Coll Radiol 2013;10(10):789-794.) 6. Aortic Atherosclerosis (ICD10-I70.0) and Emphysema (ICD10-J43.9). Electronically Signed   By: Vanetta Chou M.D.   On: 05/17/2024 13:39   DG Chest Port 1 View Result Date: 05/17/2024 CLINICAL DATA:  Chest pain EXAM: PORTABLE CHEST 1 VIEW COMPARISON:  Chest radiograph April 11, 2024 FINDINGS: Bilateral coarse interstitial markings of lung fields increased to prior. No new consolidation. Blunting of left costophrenic  angle likely due to scarring. The heart size is normal. Aortic knob is calcified. Tortuous thoracic aorta. No acute osseous abnormality. IMPRESSION: Coarse bibasilar reticular opacities, increased to prior. No new consolidation. Electronically Signed   By: Megan  Zare M.D.   On: 05/17/2024 11:25   LONG TERM MONITOR-LIVE TELEMETRY (3-14 DAYS) Result Date: 05/04/2024   The dominant rhythm is normal sinus rhythm with normal circadian variation.   There was occasional atrial fibrillation with an overall burden of 7%, with frequent episodes of rapid ventricular spots, albeit mild.   In addition there are also frequent brief episodes of nonsustained ectopic atrial tachycardia.   There are rare ventricular premature beats and no significant complex ventricular arrhythmia.   There is no evidence of significant bradycardia, pauses or high-grade AV block. Abnormal event monitor due to the occurrence of occasional paroxysmal atrial fibrillation with an overall burden of 7%.  Frequently this occurs with rapid ventricular rates. Patch Wear Time:  12 days and 20 hours (2025-06-24T09:57:16-0400 to 2025-07-07T06:51:40-0400) Patient had a min  HR of 58 bpm, max HR of 235 bpm, and avg HR of 94 bpm. Predominant underlying rhythm was Sinus Rhythm. 193 Supraventricular Tachycardia runs occurred, the run with the fastest interval lasting 6 beats with a max rate of 235 bpm, the longest lasting 28.8 secs with an avg rate of 138 bpm. Atrial Fibrillation occurred (7% burden), ranging from 74-175 bpm (avg of 121 bpm), the longest lasting 12 hours 56 mins with an avg rate of 118 bpm. Isolated SVEs were occasional (3.0%, [id number]), SVE Couplets were rare (<1.0%, 1135), and SVE Triplets were rare (<1.0%, 175). Isolated VEs were rare (<1.0%), and no VE Couplets or VE Triplets were present. Difficulty discerning atrial activity making definitive diagnosis difficult to ascertain. Previously notified: MD notification criteria for First Documentation  of Atrial Fibrillation met - report posted prior to notification (NAR).    EKG: afib rate 168 LAD poor R wave progression   ASSESSMENT AND PLAN:   PAF:  known history 7% burden on recent monitor. On coumadin  INR Rx 2 days ago would recheck. Brief period of PAF in hospital admitted with ST-> PAF Now NSR rates 90's  Normally on Toprol  100 mg but has wheezing on exam Continue iv amiodarone  today and change to oral 200 mg bid tomorrow. Start cardizem  30 mg q 8 hours orally. PAF precipitated by pancreatitis TAVR:  26 mm Sapien 3 valve normal function on recent TTE 04/12/24 with mean gradient 5 and no PVL Pancreatitis;  per primary service abdomen still tender WBC/Lipase elevated prior GB removed denies ETOH. CT abnormal and MRERCP negative for CBD stone  Pulmonary wheezing on exam CXR with bilateral effusions and ? Chronic interstitial lung dx. Lasix  40 mg PO daily ? Diastolic dysfunction and 3rd spacing with pancreatitis    Signed: Maude Emmer 05/20/2024, 8:39 AM

## 2024-05-20 NOTE — Progress Notes (Signed)
 PHARMACY - ANTICOAGULATION CONSULT NOTE  Pharmacy Consult for warfarin >> heparin  Indication: hx of DVT/PE, afib, TAVR on PTA warfarin  Allergies  Allergen Reactions   Codeine Hives    Patient Measurements: Height: 6' 1 (185.4 cm) Weight: 95.8 kg (211 lb 3.2 oz) IBW/kg (Calculated) : 79.9 HEPARIN  DW (KG): 99.3  Vital Signs: Temp: 98.1 F (36.7 C) (08/03 1206) Temp Source: Oral (08/03 1206) BP: 139/61 (08/03 1140) Pulse Rate: 91 (08/03 1206)  Labs: Recent Labs    05/18/24 0353 05/19/24 0354 05/20/24 0249  HGB 13.8 12.9* 12.8*  HCT 43.6 40.3 40.7  PLT 326 312 334  LABPROT 36.0* 39.4* 34.3*  INR 3.4* 3.8* 3.2*  CREATININE 2.26* 1.72* 1.35*    Estimated Creatinine Clearance: 51 mL/min (A) (by C-G formula based on SCr of 1.35 mg/dL (H)).   Medical History: Past Medical History:  Diagnosis Date   AAA (abdominal aortic aneurysm) (HCC)    Blood dyscrasia    followed by Dr. Cloretta, for clotting issue, has been on Coumadin  for about 20 yrs.     CAD (coronary artery disease)    Chronic kidney disease    COPD (chronic obstructive pulmonary disease) (HCC)    Diabetes mellitus without complication (HCC)    DJD (degenerative joint disease)    Dyslipidemia    Dyspnea    Gout    Malignant hypertension    Peripheral vascular disease (HCC)    Pneumonia 08/18/2013   pt. 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 Wonda and Dr. Lucas   Severe aortic stenosis    Venous thrombosis    Recurrent    Medications:  Warfarin 2mg  PO daily per last anticoag note (7/29)  Assessment: 77 yoM presented [facility] with recurrent pancreatitis. Pharmacy consulted to dose heparin  while holding warfarin for inpatient procedure.  -INR supra-therapeutic at 3.2 -CBC stable  Goal of  Therapy:  INR 2-3 Heparin  level 0.3-0.7 units/ml Monitor platelets by anticoagulation protocol: Yes   Plan:  -Start heparin  bridge once INR <2 -Start heparin  at 1500 units/hr once INR below goal -8h heparin  level -CBC and heparin  level daily -Follow warfarin restart s/p EGD inpatient   Dionicia Canavan, PharmD, RPh PGY1 Acute Care Pharmacy Resident [facility]  05/20/2024 2:30 PM

## 2024-05-20 NOTE — Progress Notes (Addendum)
 HD#3 Subjective:  Summary: This is a 78 year old male with past medical history of recurrent pancreatitis, history of CAD status post stent placement, AAA, DVT, aortic stenosis status post TAVR, A-fib on Coumadin  who presents to the emergency department with concerns of sharp abdominal pain and chest pain, and admitted for pancreatitis with Hospital course now complicated by atrial fibrillation with RVR that started on the evening of 8/1 and reoccurred morning 8/3.  Overnight Events: Overnight patient HR was stable in mid 90s. Patient satting 92% on 3L [place].   Patient went into episode of afib with RVR this morning at around 7:44 am. Was on amiodarone  drip at this time. Patient said to notice that he heart felt like it was quivering. Was given metoprolol  tartrate injection 10 mg and amiodarone  1.5 mg/mL IV bolus and HR down to 120s afterward. Cardio consulted for support in rate control.  Objective:  Vital signs in last 24 hours: Vitals:   05/19/24 1921 05/19/24 2304 05/20/24 0308 05/20/24 0741  BP: (!) 129/59 (!) 145/68 (!) 124/42   Pulse: 91 92 96 (!) 177  Resp: 20 20 20    Temp: 98.4 F (36.9 C) 98.3 F (36.8 C) 98.3 F (36.8 C)   TempSrc: Oral Oral Oral   SpO2: 90% 90% 92%   Weight:   95.8 kg   Height:       Supplemental O2: Nasal Cannula SpO2: 95 % O2 Flow Rate (L/min): 3 L/min   Physical Exam:  Constitutional: Ill appearing, uncomfortable lying in bed Cardiovascular: Tachycardic, irregularly irregular Pulmonary/Chest: Crackles appreciated in bilateral lung fields Psych: Normal mood and affect. Normal thought content  Filed Weights   05/17/24 1836 05/19/24 0403 05/20/24 0308  Weight: 99.3 kg 96.1 kg 95.8 kg     Intake/Output Summary (Last 24 hours) at 05/20/2024 1022 Last data filed at 05/20/2024 0417 Gross per 24 hour  Intake 2036.67 ml  Output 2050 ml  Net -13.33 ml   Net IO Since Admission: 2,576.11 mL [05/20/24 1022]  Pertinent Labs:    Latest Ref Rng & Units  05/20/2024    2:49 AM 05/19/2024    3:54 AM 05/18/2024    3:53 AM  CBC  WBC 4.0 - 10.5 K/uL 11.7  16.6  21.5   Hemoglobin 13.0 - 17.0 g/dL 87.1  87.0  86.1   Hematocrit 39.0 - 52.0 % 40.7  40.3  43.6   Platelets 150 - 400 K/uL 334  312  326        Latest Ref Rng & Units 05/20/2024    2:49 AM 05/19/2024    3:54 AM 05/18/2024    9:12 AM  CMP  Glucose 70 - 99 mg/dL 873  862    BUN 8 - 23 mg/dL 25  32    Creatinine 9.38 - 1.24 mg/dL 8.64  8.27    Sodium 864 - 145 mmol/L 137  136    Potassium 3.5 - 5.1 mmol/L 3.4  3.7    Chloride 98 - 111 mmol/L 102  104    CO2 22 - 32 mmol/L 23  21    Calcium  8.9 - 10.3 mg/dL 8.1  7.8    Total Protein 6.5 - 8.1 g/dL 5.7  5.1  6.0   Total Bilirubin 0.0 - 1.2 mg/dL 0.8  1.1  1.2   Alkaline Phos 38 - 126 U/L 61  55  58   AST 15 - 41 U/L 42  30  24   ALT 0 - 44 U/L 22  16  13     Imaging: MR ABDOMEN MRCP W WO CONTAST Result Date: 05/19/2024 CLINICAL DATA:  Persistent pancreatitis. EXAM: MRI ABDOMEN WITHOUT AND WITH CONTRAST (INCLUDING MRCP) TECHNIQUE: Multiplanar multisequence MR imaging of the abdomen was performed both before and after the administration of intravenous contrast. Heavily T2-weighted images of the biliary and pancreatic ducts were obtained, and three-dimensional MRCP images were rendered by post processing. CONTRAST:  10mL GADAVIST  GADOBUTROL  1 MMOL/ML IV SOLN COMPARISON:  CT scan 05/17/2024 and MRI 04/14/2023 FINDINGS: Examination limited by breathing motion artifact. Lower chest: Small bilateral pleural effusions and bibasilar atelectasis. Hepatobiliary: No hepatic lesions or intrahepatic biliary dilatation. The gallbladder is surgically absent. No common bile duct dilatation. No definite common bile duct stones. Pancreas: Peripancreatic inflammatory changes consistent with pancreatitis but the pancreatic parenchyma appears relatively normal. Normal enhancement without findings suspicious for pancreatic necrosis. Normal caliber and course of the main  pancreatic duct. No findings suspicious for pancreatic divisum. No pancreatic or peripancreatic pseudocysts. Spleen: Stable mild splenomegaly. No splenic lesions. Stable moderate-sized accessory spleen. Adrenals/Urinary Tract: The adrenal glands and kidneys are stable. Bilateral renal cysts are unchanged. Stomach/Bowel: The stomach is moderately distended. Hiatal hernia noted along with significant inflammation/edema surrounding the distal esophagus and GE junction region. The duodenum, visualized small bowel and visualized colon grossly. 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 No abdominal adenopathy. Other: Residual inflammations/edema in the mesentery and retroperitoneum and in the perinephric spaces. No discrete fluid collection to suggest abscess, hematoma or pseudocyst. Musculoskeletal: No significant bony findings. IMPRESSION: 1. Peripancreatic inflammatory changes consistent with pancreatitis but no findings suspicious for pancreatic necrosis. Normal caliber and course of the main pancreatic duct. No findings suspicious for pancreatic divisum. 2. Status post cholecystectomy. No common bile duct dilatation or definite common bile duct stones. 3. 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 4. Small bilateral pleural effusions and bibasilar atelectasis. Electronically Signed   By: MYRTIS Stammer M.D.   On: 05/19/2024 18:00   MR ABDOMEN MRCP WO CONTRAST Result Date: 05/19/2024 CLINICAL DATA:  Persistent pancreatitis. EXAM: MRI ABDOMEN WITHOUT AND WITH CONTRAST (INCLUDING MRCP) TECHNIQUE: Multiplanar multisequence MR imaging of the abdomen was performed both before and after the administration of intravenous contrast. Heavily T2-weighted  images of the biliary and pancreatic ducts were obtained, and three-dimensional MRCP images were rendered by post processing. CONTRAST:  10mL GADAVIST  GADOBUTROL  1 MMOL/ML IV SOLN COMPARISON:  CT scan 05/17/2024 and MRI 04/14/2023 FINDINGS: Examination limited by breathing motion artifact. Lower chest: Small bilateral pleural effusions and bibasilar atelectasis. Hepatobiliary: No hepatic lesions or intrahepatic biliary dilatation. The gallbladder is surgically absent. No common bile duct dilatation. No definite common bile duct stones. Pancreas: Peripancreatic inflammatory changes consistent with pancreatitis but the pancreatic parenchyma appears relatively normal. Normal enhancement without findings suspicious for pancreatic necrosis. Normal caliber and course of the main pancreatic duct. No findings suspicious for pancreatic divisum. No pancreatic or peripancreatic pseudocysts. Spleen: Stable mild splenomegaly. No splenic lesions. Stable moderate-sized accessory spleen. Adrenals/Urinary Tract: The adrenal glands and kidneys are stable. Bilateral renal cysts are unchanged. Stomach/Bowel: The stomach is moderately distended. Hiatal hernia noted along with significant inflammation/edema surrounding the distal esophagus and GE junction region. The duodenum, visualized small bowel and visualized colon grossly. 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 Short segment  complete aortic occlusion below the renal arteries. No abdominal adenopathy. Other: Residual inflammations/edema in the mesentery and retroperitoneum and in the perinephric spaces. No discrete fluid collection to suggest abscess, hematoma or pseudocyst. Musculoskeletal: No significant bony findings. IMPRESSION: 1. Peripancreatic inflammatory changes consistent with pancreatitis but no findings suspicious for pancreatic necrosis. Normal caliber and course of the main pancreatic  duct. No findings suspicious for pancreatic divisum. 2. Status post cholecystectomy. No common bile duct dilatation or definite common bile duct stones. 3. 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 4. Small bilateral pleural effusions and bibasilar atelectasis. Electronically Signed   By: MYRTIS Stammer M.D.   On: 05/19/2024 18:00    Assessment/Plan:   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) Active Problems:   Hyperlipidemia   OSA (obstructive sleep apnea)   PAD (peripheral artery disease) (HCC)   AKI (acute kidney injury) (HCC)   Chronic diastolic CHF (congestive heart failure), NYHA class 2 (HCC)   HTN (hypertension)   Leukocytosis   Acute esophagitis   PAF (paroxysmal atrial fibrillation) (HCC)   Patient Summary: Tony Zhang is a 78 y.o. male with past medical history of recurrent pancreatitis, history of CAD status post stent placement, AAA, DVT, aortic stenosis status post TAVR, A-fib on Coumadin  who presents to the emergency department with concerns of sharp abdominal pain and chest pain, and admitted for pancreatitis with Hospital course now complicated by atrial fibrillation with RVR.  #Recurrent pancreatitis First episode of pancreatitis syncope in January 2025, and second episode in June 2025.  Patient presents with his third episode of pancreatitis this admission.  He has been on continuous fluids.  Clinically, patient is slowly improving, but still needs to be worked up on etiology of pancreatitis.  Patient had MRCP yesterday, which revealed no stones, no common bile duct dilatation, inflammatory changes of pancreatitis with no pancreatic necrosis.  Will continue with fluids and diurese as needed. - Continue IV fluids - GI following, appreciate recommendations - Advance diet as tolerated - zofran  injection 4mg  q8h  PRN  #Esophagitis CT showed evidence of esophagitis.  Will need to get EGD while patient is here inpatient.  Fortunately, patient is swallowing well and does not have much concerns about this.  Will continue PPI IV twice daily.  Holding warfarin, and will bridge with heparin . - Continue Protonix  40 mg twice daily - GI following, appreciate recommendations - Plan for scoping sometime during hospitalization, had not been scheduled yet  #Atrial fibrillation with RVR Night of 8/1 patient went into A-fib with RVR with rates into the 160s and was put on amiodarone  drip.  Patient was well controlled at low 100s 8/2 but morning 8/3 back in afib RVR up to 180s. Was given metoprolol  injection and amiodarone  bolus. Cardio consulted and believe PAF likely precipitated by pancreatitis. Recommend continue IV amiodarone  today and change to oral 200 mg BID 8/4. To start Cardizem  30mg  q8h PO. Most recent INR 3.2 this morning.  - Continue amiodarone  IV - Cardiology following, appreciate recommendations - Heparin  per pharmacy once INR becomes subtherapeutic - AM PT INR - Continue monitor on telemetry - On metoprolol  succinate 100 mg daily, holding at this time, can potentially transition to metoprolol  once patient has improved rates  #AKI  Creatinine continuing to improve today down to 1.31 from 1.72.  Likely patient needed diuresis and had a component of cardiorenal syndrome.  Will continue with  diuresis as patient is getting lots of fluid. - Monitor urine output closely - Continue Lasix  40 mg daily - Hold home losartan  50 mg daily  #CAD #HFpEF Patient does have a history of CAD with drug-eluting stent to LAD on 08/23.  No acute concerns for ACS during hospitalization.  Do have some concern for volume overload in the setting of volume resuscitation. Chest X ray done yesterday showed no significant changes in small bilateral pleural effusions superimposed on suspected chronic ILD. Suspect diastolic dysfunction  and 3rd spacing with pancreatitis per Cardio. Will monitor closely. - Continue home atorvastatin  20 mg daily - Hold home Jardiance  10 mg daily - Strict I's and O's - Daily weights - Continue Lasix  40 mg daily, consider IV Lasix   - cardiology following appreciate recs   #Aortic stenosis status post TAVR January 2025 No acute concerns at this time.  However patient needs to hold warfarin in anticipation of EGD, so we will need to bridge with heparin .  INR is supratherapeutic at this time. - Monitor INR - Hold warfarin - Heparin  per pharmacy once INR becomes subtherapeutic  #Hypertension Blood pressures currently measuring lower at 109/52.  Holding home anti-hypertensive medications - Hold home losartan  50 mg daily - Hold home spironolactone  25 mg daily - Hold home metoprolol  100 mg daily - Anticipate with Starting Dilt, will have some anti hypertensive effect   #Gout -Can resume home allopurinol  300 mg daily  #COPD Patient has past medical history of COPD.  Home medication includes Symbicort .  No acute concern for exacerbation at this time. - Resume home Symbicort  (hospital formulary is Breo) - Albuterol  as needed - Monitor respiratory status  Diet: Liquid diet IVF: LR,100cc/hr VTE: INR supratherapeutic on warfarin, awaiting for INR to become subtherapeutic and then start heparin  bridge Code: DNR/DNI PT/OT recs: Pending, none.   Dispo: Anticipated discharge to Home in 3 days pending clinical improvement.   Brad Prey MS3 Please contact the on call pager after 5 pm and on weekends at [phone].  Attestation for Student Documentation:  I personally was present and re-performed the history, physical exam and medical decision-making activities of this service and have verified that the service and findings are accurately documented in the student's note.  Tobie Gaines, DO 05/20/2024, 12:25 PM

## 2024-05-21 DIAGNOSIS — I4891 Unspecified atrial fibrillation: Secondary | ICD-10-CM

## 2024-05-21 DIAGNOSIS — Z9049 Acquired absence of other specified parts of digestive tract: Secondary | ICD-10-CM

## 2024-05-21 DIAGNOSIS — K859 Acute pancreatitis without necrosis or infection, unspecified: Secondary | ICD-10-CM

## 2024-05-21 DIAGNOSIS — K209 Esophagitis, unspecified without bleeding: Secondary | ICD-10-CM | POA: Diagnosis not present

## 2024-05-21 LAB — COMPREHENSIVE METABOLIC PANEL WITH GFR
ALT: 24 U/L (ref 0–44)
AST: 41 U/L (ref 15–41)
Albumin: 2 g/dL — ABNORMAL LOW (ref 3.5–5.0)
Alkaline Phosphatase: 63 U/L (ref 38–126)
Anion gap: 12 (ref 5–15)
BUN: 18 mg/dL (ref 8–23)
CO2: 25 mmol/L (ref 22–32)
Calcium: 8.5 mg/dL — ABNORMAL LOW (ref 8.9–10.3)
Chloride: 99 mmol/L (ref 98–111)
Creatinine, Ser: 1.2 mg/dL (ref 0.61–1.24)
GFR, Estimated: >60 mL/min (ref >60)
Glucose, Bld: 126 mg/dL — ABNORMAL HIGH (ref 70–99)
Potassium: 3.8 mmol/L (ref 3.5–5.1)
Sodium: 136 mmol/L (ref 135–145)
Total Bilirubin: 0.7 mg/dL (ref 0.0–1.2)
Total Protein: 6.1 g/dL — ABNORMAL LOW (ref 6.5–8.1)

## 2024-05-21 LAB — BASIC METABOLIC PANEL WITH GFR
Anion gap: 13 (ref 5–15)
BUN: 16 mg/dL (ref 8–23)
CO2: 27 mmol/L (ref 22–32)
Calcium: 8.9 mg/dL (ref 8.9–10.3)
Chloride: 95 mmol/L — ABNORMAL LOW (ref 98–111)
Creatinine, Ser: 1.15 mg/dL (ref 0.61–1.24)
GFR, Estimated: >60 mL/min (ref >60)
Glucose, Bld: 135 mg/dL — ABNORMAL HIGH (ref 70–99)
Potassium: 3.8 mmol/L (ref 3.5–5.1)
Sodium: 135 mmol/L (ref 135–145)

## 2024-05-21 LAB — CBC
HCT: 43.1 % (ref 39.0–52.0)
Hemoglobin: 13.6 g/dL (ref 13.0–17.0)
MCH: 28.8 pg (ref 26.0–34.0)
MCHC: 31.6 g/dL (ref 30.0–36.0)
MCV: 91.1 fL (ref 80.0–100.0)
Platelets: 381 K/uL (ref 150–400)
RBC: 4.73 MIL/uL (ref 4.22–5.81)
RDW: 16 % — ABNORMAL HIGH (ref 11.5–15.5)
WBC: 12.9 K/uL — ABNORMAL HIGH (ref 4.0–10.5)
nRBC: 0 % (ref 0.0–0.2)

## 2024-05-21 LAB — PROTIME-INR
INR: 2.9 — ABNORMAL HIGH (ref 0.8–1.2)
Prothrombin Time: 31.3 s — ABNORMAL HIGH (ref 11.4–15.2)

## 2024-05-21 LAB — MAGNESIUM: Magnesium: 1.2 mg/dL — ABNORMAL LOW (ref 1.7–2.4)

## 2024-05-21 MED ORDER — AMIODARONE HCL 200 MG PO TABS
200.0000 mg | ORAL_TABLET | Freq: Two times a day (BID) | ORAL | Status: DC
  Administered 2024-05-21 – 2024-05-25 (×9): 200 mg via ORAL
  Filled 2024-05-21 (×9): qty 1

## 2024-05-21 MED ORDER — FUROSEMIDE 10 MG/ML IJ SOLN
40.0000 mg | Freq: Every day | INTRAMUSCULAR | Status: DC
  Administered 2024-05-21: 40 mg via INTRAVENOUS
  Filled 2024-05-21: qty 4

## 2024-05-21 MED ORDER — VITAMIN K1 10 MG/ML IJ SOLN
1.0000 mg | Freq: Once | INTRAVENOUS | Status: AC
  Administered 2024-05-21: 1 mg via INTRAVENOUS
  Filled 2024-05-21: qty 0.1

## 2024-05-21 MED ORDER — MAGNESIUM SULFATE 4 GM/100ML IV SOLN
4.0000 g | Freq: Once | INTRAVENOUS | Status: AC
  Administered 2024-05-21: 4 g via INTRAVENOUS
  Filled 2024-05-21: qty 100

## 2024-05-21 NOTE — Progress Notes (Addendum)
 PHARMACY - ANTICOAGULATION CONSULT NOTE  Pharmacy Consult for warfarin >> heparin  Indication: hx of DVT/PE, afib, TAVR on PTA warfarin  Allergies  Allergen Reactions   Codeine Hives    Patient Measurements: Height: 6' 1 (185.4 cm) Weight: 91.7 kg (202 lb 2.6 oz) IBW/kg (Calculated) : 79.9 HEPARIN  DW (KG): 99.3  Vital Signs: Temp: 98 F (36.7 C) (08/04 0334) Temp Source: Oral (08/04 0334) BP: 174/75 (08/04 0334) Pulse Rate: 83 (08/04 0334)  Labs: Recent Labs    05/19/24 0354 05/20/24 0249 05/21/24 0348  HGB 12.9* 12.8* 13.6  HCT 40.3 40.7 43.1  PLT 312 334 381  LABPROT 39.4* 34.3* 31.3*  INR 3.8* 3.2* 2.9*  CREATININE 1.72* 1.35* 1.20    Estimated Creatinine Clearance: 57.3 mL/min (by C-G formula based on SCr of 1.2 mg/dL).   Medical History: Past Medical History:  Diagnosis Date   AAA (abdominal aortic aneurysm) (HCC)    Blood dyscrasia    followed by Dr. Cloretta, for clotting issue, has been on Coumadin  for about 20 yrs.     CAD (coronary artery disease)    Chronic kidney disease    COPD (chronic obstructive pulmonary disease) (HCC)    Diabetes mellitus without complication (HCC)    DJD (degenerative joint disease)    Dyslipidemia    Dyspnea    Gout    Malignant hypertension    Peripheral vascular disease (HCC)    Pneumonia 08/18/2013   pt. 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 Wonda and Dr. Lucas   Severe aortic stenosis    Venous thrombosis    Recurrent    Medications:  Warfarin 2mg  PO daily per last anticoag note (7/29)  Assessment: 77 yoM presented to [facility] with recurrent pancreatitis. Pharmacy consulted to dose heparin  while holding warfarin for inpatient procedure.   INR still 2.9, essentially unchanged since admit despite holding  warfarin x 5d. Discussed with team, will give vitamin K  IV 1mg  to get INR below 2 for EGD 8/5.  Goal of Therapy:  INR 2-3 Heparin  level 0.3-0.7 units/ml Monitor platelets by anticoagulation protocol: Yes   Plan:  Give IV vitamin K  1mg  x1 Start heparin  at 1500 units/hr once INR less than 2 F/u 8h heparin  level Monitor daily INR, heparin  level, CBC, signs/symptoms of bleeding  Follow warfarin restart s/p EGD inpatient   Jinnie Door, PharmD, BCPS, BCCP Clinical Pharmacist  Please check AMION for all [facility] Pharmacy phone numbers After 10:00 PM, call Main Pharmacy [phone]

## 2024-05-21 NOTE — Progress Notes (Signed)
  Progress Note  Patient Name: Tony Zhang Date of Encounter: 05/21/2024 [facility] Cardiologist: Jerel Balding, MD   Interval Summary   Abdominal pain is improving, but still occurs if he turns over onto the left side.  He has mild nausea when he drinks water , but not vomiting. He has had 2 brief bursts of paroxysmal atrial fibrillation, each lasting about 2 hours yesterday, and normal sinus rhythm since 1800 hrs.  Vital Signs Vitals:   05/21/24 0334 05/21/24 0400 05/21/24 0742 05/21/24 0905  BP: (!) 174/75   (!) 151/81  Pulse: 83   95  Resp: 17   17  Temp: 98 F (36.7 C)   97.7 F (36.5 C)  TempSrc: Oral   Oral  SpO2: 96%  95% 91%  Weight:  91.7 kg    Height:        Intake/Output Summary (Last 24 hours) at 05/21/2024 1029 Last data filed at 05/20/2024 1935 Gross per 24 hour  Intake --  Output 2350 ml  Net -2350 ml      05/21/2024    4:00 AM 05/20/2024    3:08 AM 05/19/2024    4:03 AM  Last 3 Weights  Weight (lbs) 202 lb 2.6 oz 211 lb 3.2 oz 211 lb 13.8 oz  Weight (kg) 91.7 kg 95.8 kg 96.1 kg      Telemetry/ECG  Normal sinus rhythm since 1800 hrs., had transient paroxysmal atrial fibrillation before- Personally Reviewed  Physical Exam  GEN: No acute distress.   Neck: No JVD Cardiac: RRR, no murmurs, rubs, or gallops.  Respiratory: Clear to auscultation bilaterally. GI: Soft, nontender, non-distended  MS: No edema  Assessment & Plan  AFib: Not a new problem for him and not a surprise that would show up as an acute exacerbation during serious illness such as pancreatitis.  Understand plan to place anticoagulation on hold for upper endoscopy.  Agree with transition to oral amiodarone , and we may not continue this long-term. Recurrent pancreatitis: Puzzling why he keeps having new events despite having undergone cholecystectomy and no evidence of residual stones.  He never drinks alcohol.   Esophagitis: I wonder if the changes on the CT could just be  attributable to severe nausea and vomiting. AKI: Resolved with intravenous fluids.  He appears clinically euvolemic.  Would avoid aggressive IV fluid administration, since he is at risk for HFpEF exacerbation. S/P TAVR: Both clinical exam and recent echocardiogram suggest normal prosthetic valve function.  No plan to reevaluate.  He does require endocarditis prophylaxis for dental procedures.    For questions or updates, please contact [facility] Please consult [email or web address] for contact info under       Signed, Jerel Balding, MD

## 2024-05-21 NOTE — Progress Notes (Signed)
 Daily Progress Note  DOA: 05/17/2024 Hospital Day: 5   Patient Profile:   78 yo male with a pmh including but not limited to CAD s/p stent placement, AAA, aortic stenosis s/p TAVR, congestive heart failure , Afib on coumadin  and pancreatitis.  Admitted with chest pain , recurrent pancreatitis .  See 8/1 GI consult note  ASSESSMENT    Recurrent acute pancreatitis Cause unclear, possibly retained microlithiasis. Doesn't seem medication related. No pancreatic masses have been found.  Episode of pancreatitis in January ( possibly gallstone related with cholelithiasis but no choledocholithiasis on imaging and normal LFTs )  Had complicated hospital stay, Surgery postponed but did get cholecystectomy in April. In June he was readmitted with recurrent acute pancreatitis / normal LFTs  / no biliary findings or pancreatic masses on MRCP.   Patient readmitted now with abdominal pain, chest pain and nausea. Found to have recurrent acute pancreatitis.   New severe esophagitis, especially at the GE junction, rule out neoplasm   AFIB with RVR   AS s/p TAVR   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) Active Problems:   Hyperlipidemia   OSA (obstructive sleep apnea)   PAD (peripheral artery disease) (HCC)   AKI (acute kidney injury) (HCC)   Chronic diastolic CHF (congestive heart failure), NYHA class 2 (HCC)   HTN (hypertension)   Leukocytosis   Acute esophagitis   Paroxysmal atrial fibrillation (HCC)   Atrial fibrillation with RVR (HCC)   Acute respiratory failure with hypoxia (HCC)   PLAN   --Needs EGD when INR drifts into acceptable range and when stable from cardiopulmonary standpoint. INR is 2.9 --Continue BID IV PPI   Subjective   Nausea without vomiting today. Generalized abdominal discomfort today    Objective      Recent Labs    05/19/24 0354 05/20/24 0249 05/21/24 0348  WBC 16.6* 11.7* 12.9*  HGB 12.9* 12.8* 13.6  HCT 40.3 40.7 43.1  MCV  92.4 91.7 91.1  PLT 312 334 381   No results for input(s): FOLATE, VITAMINB12, FERRITIN, TIBC, IRONPCTSAT in the last 72 hours. Recent Labs    05/20/24 0249 05/21/24 0348 05/21/24 1255  NA 137 136 135  K 3.4* 3.8 3.8  CL 102 99 95*  CO2 23 25 27   GLUCOSE 126* 126* 135*  BUN 25* 18 16  CREATININE 1.35* 1.20 1.15  CALCIUM  8.1* 8.5* 8.9   Recent Labs    05/19/24 0354 05/20/24 0249 05/21/24 0348  PROT 5.1* 5.7* 6.1*  ALBUMIN 1.9* 1.9* 2.0*  AST 30 42* 41  ALT 16 22 24   ALKPHOS 55 61 63  BILITOT 1.1 0.8 0.7      Imaging:  MR 3D Recon At Scanner CLINICAL DATA:  Persistent pancreatitis.  EXAM: MRI ABDOMEN WITHOUT AND WITH CONTRAST (INCLUDING MRCP)  TECHNIQUE: Multiplanar multisequence MR imaging of the abdomen was performed both before and after the administration of intravenous contrast. Heavily T2-weighted images of the biliary and pancreatic ducts were obtained, and three-dimensional MRCP images were rendered by post processing.  CONTRAST:  10mL GADAVIST  GADOBUTROL  1 MMOL/ML IV SOLN  COMPARISON:  CT scan 05/17/2024 and MRI 04/14/2023  FINDINGS: Examination limited by breathing motion artifact.  Lower chest: Small bilateral pleural effusions and bibasilar atelectasis.  Hepatobiliary: No hepatic lesions or intrahepatic biliary dilatation. The gallbladder is surgically absent. No common bile duct dilatation. No definite common bile duct stones.  Pancreas: Peripancreatic inflammatory changes consistent with pancreatitis but the pancreatic parenchyma appears relatively  normal. Normal enhancement without findings suspicious for pancreatic necrosis. Normal caliber and course of the main pancreatic duct. No findings suspicious for pancreatic divisum. No pancreatic or peripancreatic pseudocysts.  Spleen: Stable mild splenomegaly. No splenic lesions. Stable moderate-sized accessory spleen.  Adrenals/Urinary Tract: The adrenal glands and kidneys are  stable. Bilateral renal cysts are unchanged.  Stomach/Bowel: The stomach is moderately distended. Hiatal hernia noted along with significant inflammation/edema surrounding the distal esophagus and GE junction region. The duodenum, visualized small bowel and visualized colon grossly.  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 No abdominal adenopathy.  Other: Residual inflammations/edema in the mesentery and retroperitoneum and in the perinephric spaces. No discrete fluid collection to suggest abscess, hematoma or pseudocyst.  Musculoskeletal: No significant bony findings.  IMPRESSION: 1. Peripancreatic inflammatory changes consistent with pancreatitis but no findings suspicious for pancreatic necrosis. Normal caliber and course of the main pancreatic duct. No findings suspicious for pancreatic divisum. 2. Status post cholecystectomy. No common bile duct dilatation or definite common bile duct stones. 3. 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 4. Small bilateral pleural effusions and bibasilar atelectasis.  Electronically Signed   By: MYRTIS Stammer M.D.   On: 05/19/2024 18:00     Scheduled inpatient medications:   allopurinol   300 mg Oral Daily   amiodarone   200 mg Oral BID   atorvastatin   20 mg Oral Daily   diltiazem   30 mg Oral Q6H   feeding supplement  237 mL Oral BID BM   fluticasone  furoate-vilanterol  1 puff Inhalation Daily   furosemide   40 mg Intravenous Daily   pantoprazole  (PROTONIX ) IV  40 mg Intravenous Q12H   Continuous inpatient infusions:  PRN inpatient medications: acetaminophen  **OR** acetaminophen , albuterol , HYDROmorphone  (DILAUDID ) injection, ondansetron  (ZOFRAN ) IV,  polyethylene glycol  Vital signs in last 24 hours: Temp:  [97.7 F (36.5 C)-98 F (36.7 C)] 98 F (36.7 C) (08/04 1236) Pulse Rate:  [83-96] 93 (08/04 1236) Resp:  [13-20] 20 (08/04 1236) BP: (120-174)/(55-83) 164/68 (08/04 1236) SpO2:  [91 %-96 %] 92 % (08/04 1236) FiO2 (%):  [1 %] 1 % (08/04 1236) Weight:  [91.7 kg] 91.7 kg (08/04 0400) Last BM Date : 05/17/24  Intake/Output Summary (Last 24 hours) at 05/21/2024 1522 Last data filed at 05/21/2024 1325 Gross per 24 hour  Intake 358 ml  Output 1950 ml  Net -1592 ml    Intake/Output from previous day: 08/03 0701 - 08/04 0700 In: -  Out: 2350 [Urine:2350] Intake/Output this shift: Total I/O In: 358 [P.O.:358] Out: 1050 [Urine:1050]   Physical Exam:  General: Alert male in NAD Heart:  Regular rate and rhythm.  Pulmonary: Normal respiratory effort. Expiratory wheezing Abdomen: Soft, nondistended, generalized tenderness.  Normal bowel sounds. Extremities: No lower extremity edema  Neurologic: Alert and oriented Psych: Pleasant. Cooperative     LOS: 4 days   Vina Dasen ,NP 05/21/2024, 3:22 PM

## 2024-05-21 NOTE — Progress Notes (Addendum)
 HD#4 Subjective:  Summary: This is a 78 year old male with past medical history of recurrent pancreatitis, history of CAD status post stent placement, AAA, DVT, aortic stenosis status post TAVR, A-fib on Coumadin  who presents to the emergency department with concerns of sharp abdominal pain and chest pain, and admitted for pancreatitis with Hospital course now complicated by atrial fibrillation with RVR that started on the evening of 8/1 and reoccurred morning 8/3.   Overnight Events: Overnight patient HR was stable in mid 80s-90s. Patient satting 91% on 1L [place].   Patient feeling much better this morning in regard to palpitations. He feels his breathing is improving as well. He is currently on an all clear liquid diet in anticipation of EGD. He still feels abdominal tenderness diffusely throughout that he states today is unchanged from when he was admitted; he describes it as a 9/10. He coughs and says sometimes he brings up phlegm.   Objective:  Vital signs in last 24 hours: Vitals:   05/21/24 0334 05/21/24 0400 05/21/24 0742 05/21/24 0905  BP: (!) 174/75   (!) 151/81  Pulse: 83   95  Resp: 17   17  Temp: 98 F (36.7 C)   97.7 F (36.5 C)  TempSrc: Oral   Oral  SpO2: 96%  95% 91%  Weight:  91.7 kg    Height:       Supplemental O2: Nasal Cannula SpO2: 95 % O2 Flow Rate (L/min): 3 L/min   Physical Exam:  Const:: Awake, alert in NAD HENT: Normocephalic, atraumatict Card: RRR, No MRG, No pitting edema on LE's bilaterally  Resp: No increased work of breathing Abd:  BSX4. Pain induced on pressuring abdomen with stethoscope. Pain on percussion. Extremities: RLE cooler to touch than LLE  Filed Weights   05/19/24 0403 05/20/24 0308 05/21/24 0400  Weight: 96.1 kg 95.8 kg 91.7 kg     Intake/Output Summary (Last 24 hours) at 05/21/2024 1232 Last data filed at 05/20/2024 1935 Gross per 24 hour  Intake --  Output 1600 ml  Net -1600 ml   Net IO Since Admission: 226.11 mL [05/21/24  1232]  Pertinent Labs: PT/INR: 31.3/2.9 Magnesium : 1.2    Latest Ref Rng & Units 05/21/2024    3:48 AM 05/20/2024    2:49 AM 05/19/2024    3:54 AM  CBC  WBC 4.0 - 10.5 K/uL 12.9  11.7  16.6   Hemoglobin 13.0 - 17.0 g/dL 86.3  87.1  87.0   Hematocrit 39.0 - 52.0 % 43.1  40.7  40.3   Platelets 150 - 400 K/uL 381  334  312        Latest Ref Rng & Units 05/21/2024    3:48 AM 05/20/2024    2:49 AM 05/19/2024    3:54 AM  CMP  Glucose 70 - 99 mg/dL 873  873  862   BUN 8 - 23 mg/dL 18  25  32   Creatinine 0.61 - 1.24 mg/dL 8.79  8.64  8.27   Sodium 135 - 145 mmol/L 136  137  136   Potassium 3.5 - 5.1 mmol/L 3.8  3.4  3.7   Chloride 98 - 111 mmol/L 99  102  104   CO2 22 - 32 mmol/L 25  23  21    Calcium  8.9 - 10.3 mg/dL 8.5  8.1  7.8   Total Protein 6.5 - 8.1 g/dL 6.1  5.7  5.1   Total Bilirubin 0.0 - 1.2 mg/dL 0.7  0.8  1.1  Alkaline Phos 38 - 126 U/L 63  61  55   AST 15 - 41 U/L 41  42  30   ALT 0 - 44 U/L 24  22  16      Imaging: No results found.   Assessment/Plan:   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) Active Problems:   Hyperlipidemia   OSA (obstructive sleep apnea)   PAD (peripheral artery disease) (HCC)   AKI (acute kidney injury) (HCC)   Chronic diastolic CHF (congestive heart failure), NYHA class 2 (HCC)   HTN (hypertension)   Leukocytosis   Acute esophagitis   Paroxysmal atrial fibrillation (HCC)   Atrial fibrillation with RVR (HCC)   Acute respiratory failure with hypoxia (HCC)   Patient Summary: Tony Zhang is a 78 y.o. male with past medical history of recurrent pancreatitis, history of CAD status post stent placement, AAA, DVT, aortic stenosis status post TAVR, A-fib on Coumadin  who presents to the emergency department with concerns of sharp abdominal pain and chest pain, and admitted for pancreatitis with Hospital course now complicated by atrial fibrillation with RVR.  #Recurrent pancreatitis First episode of pancreatitis in January  2025, and second episode in June 2025.  Patient presents with his third episode of pancreatitis this admission.  He has been on continuous fluids scheduled to discontinue at 12:59pm.  Clinically, still has pain but shows improvement in other aspects such as nausea/vomiting/limited breathing.  Patient had MRCP 8/2, which revealed no stones, no common bile duct dilatation, inflammatory changes of pancreatitis with no pancreatic necrosis.  Will pause fluids for now as patient is on full liquid diet and diurese as needed. - GI following, appreciate recommendations - Advance diet as tolerated - zofran  injection 4mg  q8h PRN - AM CBC  #Esophagitis CT showed evidence of esophagitis.  Will need to get EGD while patient is here inpatient.  Fortunately, patient is swallowing well and does not have much concerns about this.  Will continue PPI IV twice daily.  Holding warfarin, and will bridge with heparin . INR is 2.9 today; will consider Vitamin K  to facilitate heparin  bridge. - Continue Protonix  40 mg twice daily - GI following, appreciate recommendations - Plan for scoping sometime during hospitalization, had not been scheduled yet - vitamin K  - AM Protime-INR  #Atrial fibrillation with RVR Night of 8/1 patient went into A-fib with RVR with rates into the 160s and was put on amiodarone  drip.  Patient was well controlled at low 100s 8/2 but morning 8/3 back in afib RVR up to 180s. Was given metoprolol  injection and amiodarone  bolus. Cardio consulted and believe PAF likely precipitated by pancreatitis. Recommend continue IV amiodarone  today and change to oral 200 mg BID 8/4. Most recent INR 2.9 this morning.  - Change to amiodarone  PO 200 mg - Cardizem  30mg  q6h PO - Cardiology following, appreciate recommendations - Heparin  per pharmacy once INR becomes subtherapeutic - AM PT INR - Continue monitor on telemetry - On metoprolol  succinate 100 mg daily, holding at this time, can potentially transition to  metoprolol  once patient has improved rates  #AKI  Creatinine continuing to improve today down to 1.20 from 1.35.  Likely patient needed diuresis and had a component of cardiorenal syndrome.  Will continue with diuresis and monitor fluid status. - Monitor urine output closely - Continue Lasix  40 mg daily - Hold home losartan  50 mg daily - AM BMP  #CAD #HFpEF Patient does have a history of CAD with drug-eluting stent to LAD on  08/23.  No acute concerns for ACS during hospitalization. Suspect diastolic dysfunction and 3rd spacing with pancreatitis per Cardio. Will monitor closely. - Continue home atorvastatin  20 mg daily - Hold home Jardiance  10 mg daily - Strict I's and O's - Daily weights - Continue Lasix  40 mg daily, consider IV Lasix   - cardiology following appreciate recs   #Aortic stenosis status post TAVR January 2025 No acute concerns at this time.  However patient needs to hold warfarin in anticipation of EGD, so we will need to bridge with heparin .  INR is 2.9 today. - Monitor INR - Vitamin K  - Hold warfarin - Heparin  per pharmacy once INR becomes subtherapeutic  #Hypertension Blood pressures currently measuring lower at 109/52.  Holding home anti-hypertensive medications - Hold home losartan  50 mg daily - Hold home spironolactone  25 mg daily - Hold home metoprolol  100 mg daily - Anticipate with Starting Dilt, will have some anti hypertensive effect   #Gout -Can resume home allopurinol  300 mg daily  #COPD Patient has past medical history of COPD.  Home medication includes Symbicort .  No acute concern for exacerbation at this time. Saturating 91% on 1L [place] today with no increased WOB.  - Resume home Symbicort  (hospital formulary is [place]) - Albuterol  as needed - Monitor respiratory status  Diet: Liquid diet IVF: LR,100cc/hr VTE: INR supratherapeutic on warfarin, awaiting for INR to become subtherapeutic and then start heparin  bridge Code: DNR/DNI PT/OT recs: Pending,  none.   Dispo: Anticipated discharge to Home in 3 days pending clinical improvement.   Attestation for Student Documentation:  I personally was present and re-performed the history, physical exam and medical decision-making activities of this service and have verified that the service and findings are accurately documented in the student's note.  Bernadine Manos, MD 05/21/2024, 2:08 PM   Fayne Cambric, Medical Student 05/21/2024, 12:32 PM  Attestation for Student Documentation:   I personally was present and re-performed the history, physical exam and medical decision-making activities of this service and have verified that the service and findings are accurately documented in the student's note. Will continue to work to improve pt's pain.  Appreciate CV help with afib and oral amio.  Eben Reyes BROCKS, MD 05/21/2024, 12:02 PM

## 2024-05-21 NOTE — H&P (View-Only) (Signed)
 Daily Progress Note  DOA: 05/17/2024 Hospital Day: 5   Patient Profile:   78 yo male with a pmh including but not limited to CAD s/p stent placement, AAA, aortic stenosis s/p TAVR, congestive heart failure , Afib on coumadin  and pancreatitis.  Admitted with chest pain , recurrent pancreatitis .  See 8/1 GI consult note  ASSESSMENT    Recurrent acute pancreatitis Cause unclear, possibly retained microlithiasis. Doesn't seem medication related. No pancreatic masses have been found.  Episode of pancreatitis in January ( possibly gallstone related with cholelithiasis but no choledocholithiasis on imaging and normal LFTs )  Had complicated hospital stay, Surgery postponed but did get cholecystectomy in April. In June he was readmitted with recurrent acute pancreatitis / normal LFTs  / no biliary findings or pancreatic masses on MRCP.   Patient readmitted now with abdominal pain, chest pain and nausea. Found to have recurrent acute pancreatitis.   New severe esophagitis, especially at the GE junction, rule out neoplasm   AFIB with RVR   AS s/p TAVR   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) Active Problems:   Hyperlipidemia   OSA (obstructive sleep apnea)   PAD (peripheral artery disease) (HCC)   AKI (acute kidney injury) (HCC)   Chronic diastolic CHF (congestive heart failure), NYHA class 2 (HCC)   HTN (hypertension)   Leukocytosis   Acute esophagitis   Paroxysmal atrial fibrillation (HCC)   Atrial fibrillation with RVR (HCC)   Acute respiratory failure with hypoxia (HCC)   PLAN   --Needs EGD when INR drifts into acceptable range and when stable from cardiopulmonary standpoint. INR is 2.9 --Continue BID IV PPI   Subjective   Nausea without vomiting today. Generalized abdominal discomfort today    Objective      Recent Labs    05/19/24 0354 05/20/24 0249 05/21/24 0348  WBC 16.6* 11.7* 12.9*  HGB 12.9* 12.8* 13.6  HCT 40.3 40.7 43.1  MCV  92.4 91.7 91.1  PLT 312 334 381   No results for input(s): FOLATE, VITAMINB12, FERRITIN, TIBC, IRONPCTSAT in the last 72 hours. Recent Labs    05/20/24 0249 05/21/24 0348 05/21/24 1255  NA 137 136 135  K 3.4* 3.8 3.8  CL 102 99 95*  CO2 23 25 27   GLUCOSE 126* 126* 135*  BUN 25* 18 16  CREATININE 1.35* 1.20 1.15  CALCIUM  8.1* 8.5* 8.9   Recent Labs    05/19/24 0354 05/20/24 0249 05/21/24 0348  PROT 5.1* 5.7* 6.1*  ALBUMIN 1.9* 1.9* 2.0*  AST 30 42* 41  ALT 16 22 24   ALKPHOS 55 61 63  BILITOT 1.1 0.8 0.7      Imaging:  MR 3D Recon At Scanner CLINICAL DATA:  Persistent pancreatitis.  EXAM: MRI ABDOMEN WITHOUT AND WITH CONTRAST (INCLUDING MRCP)  TECHNIQUE: Multiplanar multisequence MR imaging of the abdomen was performed both before and after the administration of intravenous contrast. Heavily T2-weighted images of the biliary and pancreatic ducts were obtained, and three-dimensional MRCP images were rendered by post processing.  CONTRAST:  10mL GADAVIST  GADOBUTROL  1 MMOL/ML IV SOLN  COMPARISON:  CT scan 05/17/2024 and MRI 04/14/2023  FINDINGS: Examination limited by breathing motion artifact.  Lower chest: Small bilateral pleural effusions and bibasilar atelectasis.  Hepatobiliary: No hepatic lesions or intrahepatic biliary dilatation. The gallbladder is surgically absent. No common bile duct dilatation. No definite common bile duct stones.  Pancreas: Peripancreatic inflammatory changes consistent with pancreatitis but the pancreatic parenchyma appears relatively  normal. Normal enhancement without findings suspicious for pancreatic necrosis. Normal caliber and course of the main pancreatic duct. No findings suspicious for pancreatic divisum. No pancreatic or peripancreatic pseudocysts.  Spleen: Stable mild splenomegaly. No splenic lesions. Stable moderate-sized accessory spleen.  Adrenals/Urinary Tract: The adrenal glands and kidneys are  stable. Bilateral renal cysts are unchanged.  Stomach/Bowel: The stomach is moderately distended. Hiatal hernia noted along with significant inflammation/edema surrounding the distal esophagus and GE junction region. The duodenum, visualized small bowel and visualized colon grossly.  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 No abdominal adenopathy.  Other: Residual inflammations/edema in the mesentery and retroperitoneum and in the perinephric spaces. No discrete fluid collection to suggest abscess, hematoma or pseudocyst.  Musculoskeletal: No significant bony findings.  IMPRESSION: 1. Peripancreatic inflammatory changes consistent with pancreatitis but no findings suspicious for pancreatic necrosis. Normal caliber and course of the main pancreatic duct. No findings suspicious for pancreatic divisum. 2. Status post cholecystectomy. No common bile duct dilatation or definite common bile duct stones. 3. 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 4. Small bilateral pleural effusions and bibasilar atelectasis.  Electronically Signed   By: MYRTIS Stammer M.D.   On: 05/19/2024 18:00     Scheduled inpatient medications:   allopurinol   300 mg Oral Daily   amiodarone   200 mg Oral BID   atorvastatin   20 mg Oral Daily   diltiazem   30 mg Oral Q6H   feeding supplement  237 mL Oral BID BM   fluticasone  furoate-vilanterol  1 puff Inhalation Daily   furosemide   40 mg Intravenous Daily   pantoprazole  (PROTONIX ) IV  40 mg Intravenous Q12H   Continuous inpatient infusions:  PRN inpatient medications: acetaminophen  **OR** acetaminophen , albuterol , HYDROmorphone  (DILAUDID ) injection, ondansetron  (ZOFRAN ) IV,  polyethylene glycol  Vital signs in last 24 hours: Temp:  [97.7 F (36.5 C)-98 F (36.7 C)] 98 F (36.7 C) (08/04 1236) Pulse Rate:  [83-96] 93 (08/04 1236) Resp:  [13-20] 20 (08/04 1236) BP: (120-174)/(55-83) 164/68 (08/04 1236) SpO2:  [91 %-96 %] 92 % (08/04 1236) FiO2 (%):  [1 %] 1 % (08/04 1236) Weight:  [91.7 kg] 91.7 kg (08/04 0400) Last BM Date : 05/17/24  Intake/Output Summary (Last 24 hours) at 05/21/2024 1522 Last data filed at 05/21/2024 1325 Gross per 24 hour  Intake 358 ml  Output 1950 ml  Net -1592 ml    Intake/Output from previous day: 08/03 0701 - 08/04 0700 In: -  Out: 2350 [Urine:2350] Intake/Output this shift: Total I/O In: 358 [P.O.:358] Out: 1050 [Urine:1050]   Physical Exam:  General: Alert male in NAD Heart:  Regular rate and rhythm.  Pulmonary: Normal respiratory effort. Expiratory wheezing Abdomen: Soft, nondistended, generalized tenderness.  Normal bowel sounds. Extremities: No lower extremity edema  Neurologic: Alert and oriented Psych: Pleasant. Cooperative     LOS: 4 days   Vina Dasen ,NP 05/21/2024, 3:22 PM

## 2024-05-22 ENCOUNTER — Encounter (HOSPITAL_COMMUNITY): Payer: Self-pay | Admitting: Infectious Diseases

## 2024-05-22 ENCOUNTER — Inpatient Hospital Stay (HOSPITAL_COMMUNITY): Admitting: Anesthesiology

## 2024-05-22 ENCOUNTER — Encounter (HOSPITAL_COMMUNITY)
Admission: EM | Disposition: A | Discharge status: Home / Self Care | Payer: Self-pay | Source: Home / Self Care | Attending: Infectious Diseases

## 2024-05-22 DIAGNOSIS — K449 Diaphragmatic hernia without obstruction or gangrene: Secondary | ICD-10-CM

## 2024-05-22 DIAGNOSIS — I2510 Atherosclerotic heart disease of native coronary artery without angina pectoris: Secondary | ICD-10-CM | POA: Diagnosis not present

## 2024-05-22 DIAGNOSIS — K2970 Gastritis, unspecified, without bleeding: Secondary | ICD-10-CM

## 2024-05-22 DIAGNOSIS — I10 Essential (primary) hypertension: Secondary | ICD-10-CM

## 2024-05-22 DIAGNOSIS — K3189 Other diseases of stomach and duodenum: Secondary | ICD-10-CM

## 2024-05-22 DIAGNOSIS — I5032 Chronic diastolic (congestive) heart failure: Secondary | ICD-10-CM

## 2024-05-22 DIAGNOSIS — I110 Hypertensive heart disease with heart failure: Secondary | ICD-10-CM

## 2024-05-22 DIAGNOSIS — I4891 Unspecified atrial fibrillation: Secondary | ICD-10-CM | POA: Diagnosis not present

## 2024-05-22 DIAGNOSIS — R933 Abnormal findings on diagnostic imaging of other parts of digestive tract: Secondary | ICD-10-CM

## 2024-05-22 DIAGNOSIS — K2990 Gastroduodenitis, unspecified, without bleeding: Secondary | ICD-10-CM

## 2024-05-22 DIAGNOSIS — K2950 Unspecified chronic gastritis without bleeding: Secondary | ICD-10-CM

## 2024-05-22 HISTORY — PX: ESOPHAGOGASTRODUODENOSCOPY: SHX5428

## 2024-05-22 LAB — PROTIME-INR
INR: 1.3 — ABNORMAL HIGH (ref 0.8–1.2)
Prothrombin Time: 17.3 s — ABNORMAL HIGH (ref 11.4–15.2)

## 2024-05-22 LAB — CBC
HCT: 44.5 % (ref 39.0–52.0)
Hemoglobin: 14.2 g/dL (ref 13.0–17.0)
MCH: 29.1 pg (ref 26.0–34.0)
MCHC: 31.9 g/dL (ref 30.0–36.0)
MCV: 91.2 fL (ref 80.0–100.0)
Platelets: 425 K/uL — ABNORMAL HIGH (ref 150–400)
RBC: 4.88 MIL/uL (ref 4.22–5.81)
RDW: 15.9 % — ABNORMAL HIGH (ref 11.5–15.5)
WBC: 14.3 K/uL — ABNORMAL HIGH (ref 4.0–10.5)
nRBC: 0 % (ref 0.0–0.2)

## 2024-05-22 LAB — HEPARIN LEVEL (UNFRACTIONATED): Heparin Unfractionated: <0.1 [IU]/mL — ABNORMAL LOW (ref 0.30–0.70)

## 2024-05-22 LAB — MAGNESIUM: Magnesium: 1.7 mg/dL (ref 1.7–2.4)

## 2024-05-22 SURGERY — EGD (ESOPHAGOGASTRODUODENOSCOPY)
Anesthesia: Monitor Anesthesia Care

## 2024-05-22 MED ORDER — MAGNESIUM SULFATE 2 GM/50ML IV SOLN
2.0000 g | Freq: Once | INTRAVENOUS | Status: AC
  Administered 2024-05-22: 2 g via INTRAVENOUS
  Filled 2024-05-22: qty 50

## 2024-05-22 MED ORDER — HEPARIN (PORCINE) 25000 UT/250ML-% IV SOLN
2100.0000 [IU]/h | INTRAVENOUS | Status: DC
  Administered 2024-05-22: 1500 [IU]/h via INTRAVENOUS
  Administered 2024-05-23: 1800 [IU]/h via INTRAVENOUS
  Filled 2024-05-22 (×2): qty 250

## 2024-05-22 MED ORDER — PROPOFOL 10 MG/ML IV BOLUS
INTRAVENOUS | Status: DC | PRN
  Administered 2024-05-22 (×2): 20 mg via INTRAVENOUS

## 2024-05-22 MED ORDER — SODIUM CHLORIDE 0.9 % IV SOLN: INTRAVENOUS | Status: DC

## 2024-05-22 MED ORDER — SENNA 8.6 MG PO TABS
2.0000 | ORAL_TABLET | Freq: Every day | ORAL | Status: DC
  Administered 2024-05-22 – 2024-05-24 (×3): 17.2 mg via ORAL
  Filled 2024-05-22 (×3): qty 2

## 2024-05-22 MED ORDER — PROPOFOL 500 MG/50ML IV EMUL
INTRAVENOUS | Status: DC | PRN
Start: 2024-05-22 — End: 2024-05-22
  Administered 2024-05-22: 140 ug/kg/min via INTRAVENOUS

## 2024-05-22 MED ORDER — ONDANSETRON HCL 4 MG/2ML IJ SOLN
INTRAMUSCULAR | Status: AC
  Filled 2024-05-22: qty 2

## 2024-05-22 MED ORDER — PHENYLEPHRINE HCL (PRESSORS) 10 MG/ML IV SOLN
INTRAVENOUS | Status: DC | PRN
  Administered 2024-05-22: 160 ug via INTRAVENOUS

## 2024-05-22 MED ORDER — OXYCODONE HCL 5 MG PO TABS
5.0000 mg | ORAL_TABLET | Freq: Four times a day (QID) | ORAL | Status: DC | PRN
  Administered 2024-05-22 – 2024-05-23 (×5): 5 mg via ORAL
  Administered 2024-05-23: 10 mg via ORAL
  Administered 2024-05-24: 5 mg via ORAL
  Administered 2024-05-24 – 2024-05-25 (×3): 10 mg via ORAL
  Filled 2024-05-22 (×2): qty 1
  Filled 2024-05-22: qty 2
  Filled 2024-05-22: qty 1
  Filled 2024-05-22 (×4): qty 2
  Filled 2024-05-22 (×2): qty 1

## 2024-05-22 MED ORDER — LACTATED RINGERS IV BOLUS
500.0000 mL | Freq: Once | INTRAVENOUS | Status: AC
  Administered 2024-05-22: 500 mL via INTRAVENOUS

## 2024-05-22 MED ORDER — ONDANSETRON HCL 4 MG/2ML IJ SOLN
4.0000 mg | Freq: Once | INTRAMUSCULAR | Status: AC
  Administered 2024-05-22: 4 mg via INTRAVENOUS

## 2024-05-22 MED ORDER — LOSARTAN POTASSIUM 50 MG PO TABS
50.0000 mg | ORAL_TABLET | Freq: Every day | ORAL | Status: DC
  Administered 2024-05-22 – 2024-05-25 (×4): 50 mg via ORAL
  Filled 2024-05-22 (×4): qty 1

## 2024-05-22 NOTE — Progress Notes (Signed)
 PHARMACY - ANTICOAGULATION CONSULT NOTE  Pharmacy Consult for warfarin >> heparin  Indication: hx of DVT/PE, afib   Allergies  Allergen Reactions   Codeine Hives    Patient Measurements: Height: 6' 1 (185.4 cm) Weight: 90.1 kg (198 lb 10.2 oz) IBW/kg (Calculated) : 79.9 HEPARIN  DW (KG): 99.3  Vital Signs: Temp: 98.1 F (36.7 C) (08/05 0358) Temp Source: Oral (08/05 0358) BP: 188/67 (08/05 0358) Pulse Rate: 94 (08/05 0358)  Labs: Recent Labs    05/20/24 0249 05/21/24 0348 05/21/24 1255 05/22/24 0437  HGB 12.8* 13.6  --  14.2  HCT 40.7 43.1  --  44.5  PLT 334 381  --  425*  LABPROT 34.3* 31.3*  --  17.3*  INR 3.2* 2.9*  --  1.3*  CREATININE 1.35* 1.20 1.15  --     Estimated Creatinine Clearance: 59.8 mL/min (by C-G formula based on SCr of 1.15 mg/dL).   Medical History: Past Medical History:  Diagnosis Date   AAA (abdominal aortic aneurysm) (HCC)    Blood dyscrasia    followed by Dr. Cloretta, for clotting issue, has been on Coumadin  for about 20 yrs.     CAD (coronary artery disease)    Chronic kidney disease    COPD (chronic obstructive pulmonary disease) (HCC)    Diabetes mellitus without complication (HCC)    DJD (degenerative joint disease)    Dyslipidemia    Dyspnea    Gout    Malignant hypertension    Peripheral vascular disease (HCC)    Pneumonia 08/18/2013   pt. 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 Wonda and Dr. Lucas   Severe aortic stenosis    Venous thrombosis    Recurrent    Assessment: 58 yoM presented to [facility] with recurrent pancreatitis. Patient is on warfarin PTA for Afib (CHADSVASc = 4) and hx recurrent DVT/PE now held for procedure.  Pharmacy consulted to dose heparin .   8/4 Gave vitamin K  IV 1mg  to get INR below 2 for EGD  8/5. ING now 1.3, will start heparin .   PTA warfarin=4mg  sat/sun, 2mg  all other days. INR was 4.1 on 05/01/24 so dose was held x2 days and decreased to 2mg  daily   Goal of Therapy:  INR 2-3 Heparin  level 0.3-0.7 units/ml Monitor platelets by anticoagulation protocol: Yes   Plan:   Start heparin  at 1500 units/hr   F/u 8h heparin  level Monitor daily INR, heparin  level, CBC, signs/symptoms of bleeding  Follow warfarin restart s/p EGD     Jinnie Door, PharmD, BCPS, BCCP Clinical Pharmacist  Please check AMION for all [facility] phone numbers After 10:00 PM, call Main Pharmacy [phone]

## 2024-05-22 NOTE — Interval H&P Note (Signed)
 History and Physical Interval Note: For EGD today to eval abnormal CT esophagus. Patient reports abdominal pain improving INR 1.3 Heparin  drip planned but not therapeutic, held for now.  The nature of the procedure, as well as the risks, benefits, and alternatives were carefully and thoroughly reviewed with the patient. Ample time for discussion and questions allowed. The patient understood, was satisfied, and agreed to proceed.    05/22/2024 10:48 AM  Tony Zhang  has presented today for surgery, with the diagnosis of abnormal CT scan esophagus.  The various methods of treatment have been discussed with the patient and family. After consideration of risks, benefits and other options for treatment, the patient has consented to  Procedure(s): EGD (ESOPHAGOGASTRODUODENOSCOPY) (N/A) as a surgical intervention.  The patient's history has been reviewed, patient examined, no change in status, stable for surgery.  I have reviewed the patient's chart and labs.  Questions were answered to the patient's satisfaction.     Tony Zhang

## 2024-05-22 NOTE — Progress Notes (Signed)
 PT Cancellation Note  Patient Details Name: Tony Zhang MRN: [id number] DOB: 10-Jun-1946   Cancelled Treatment:    Reason Eval/Treat Not Completed: Medical issues which prohibited therapy (discussed with RN who asked to hold due to irregular HR post EGD).  Tony Zhang, PT, DPT Acute Rehabilitation Services Office [phone]    Tony Zhang 05/22/2024, 3:21 PM

## 2024-05-22 NOTE — Op Note (Signed)
 [facility] Patient Name: Tony Zhang Procedure Date : 05/22/2024 MRN: [id number] Attending MD: Gordy CHRISTELLA Starch , MD, [id number] Date of Birth: [birth year] CSN: [id number] Age: 78 Admit Type: Inpatient Procedure:                Upper GI endoscopy Indications:              Abnormal CT of the GI tract (esophageal thickening,                            mass not excluded), recurrent pancreatitis with neg                            MRCP (s/p lap chole in April 2025) Providers:                Gordy CHRISTELLA. Starch, MD, Gregoria Pierce, RN, Janie                            [place], Technician Referring MD:             IM Teaching Service Medicines:                Monitored Anesthesia Care Complications:            No immediate complications. Estimated Blood Loss:     Estimated blood loss: none. Procedure:                Pre-Anesthesia Assessment:                           - Prior to the procedure, a History and Physical                            was performed, and patient medications and                            allergies were reviewed. The patient's tolerance of                            previous anesthesia was also reviewed. The risks                            and benefits of the procedure and the sedation                            options and risks were discussed with the patient.                            All questions were answered, and informed consent                            was obtained. Prior Anticoagulants: The patient has                            taken heparin , last dose was day of procedure. ASA  Grade Assessment: III - A patient with severe                            systemic disease. After reviewing the risks and                            benefits, the patient was deemed in satisfactory                            condition to undergo the procedure.                           After obtaining informed consent, the endoscope was                             passed under direct vision. Throughout the                            procedure, the patient's blood pressure, pulse, and                            oxygen saturations were monitored continuously. The                            [id number]-[id number] ([id number]) Olympus endoscope was introduced                            through the mouth, and advanced to the second part                            of duodenum. The upper GI endoscopy was                            accomplished without difficulty. The patient                            tolerated the procedure well. Scope In: Scope Out: Findings:      Normal mucosa was found in the entire esophagus. No masses or       significant esophagitis.      A 1 cm hiatal hernia was present.      Diffuse moderate inflammation characterized by erythema and edema was       found in the gastric antrum and in the prepyloric region of the stomach.       Chronic appearing and without bleeding or ulceration.      Three 7 to 15 mm submucosal polyps/nodules were found in the second       portion of the duodenum. These are not inflamed or ulcerated (query       lipoma). Biopsy not performed due to need for immediate anticoagulation       and relative low yield of mucosal biopsy of submucosal lesion. Impression:               - Normal mucosa was found in the entire esophagus.  No esophageal or gastric mass.                           - 1 cm hiatal hernia.                           - Chronic gastritis without bleeding.                           - Submucosal polyp/nodule x 3 found in the second                            portion of duodenum. Incompletely characterized by                            EGD, but not an acute issue currently. Unlikely of                            clinical consequence now.                           - No specimens collected. Moderate Sedation:      N/A Recommendation:           - Return patient to hospital ward  for ongoing care.                           - Advance diet as tolerated.                           - Continue present medications. Daily PPI is                            recommended.                           - Okay to resume anticoagulation now per cardiology.                           - Ongoing supportive care of pancreatitis. If                            pancreatitis recurs consideration should be given                            to EUS to evaluate CBD to exclude microlithiasis                            but also to characterize small bowel submucosal                            lesions. Procedure Code(s):        --- Professional ---                           [phone], Esophagogastroduodenoscopy, flexible,  transoral; diagnostic, including collection of                            specimen(s) by brushing or washing, when performed                            (separate procedure) Diagnosis Code(s):        --- Professional ---                           K44.9, Diaphragmatic hernia without obstruction or                            gangrene                           K29.50, Unspecified chronic gastritis without                            bleeding                           K31.89, Other diseases of stomach and duodenum                           R93.3, Abnormal findings on diagnostic imaging of                            other parts of digestive tract CPT copyright 2022 [employer]. All rights reserved. The codes documented in this report are preliminary and upon coder review may  be revised to meet current compliance requirements. Gordy CHRISTELLA Starch, MD 05/22/2024 11:33:28 AM This report has been signed electronically. Number of Addenda: 0

## 2024-05-22 NOTE — Progress Notes (Signed)
 PHARMACY - ANTICOAGULATION CONSULT NOTE  Pharmacy Consult for warfarin >> heparin  Indication: hx of DVT/PE, afib   Allergies  Allergen Reactions   Codeine Hives    Patient Measurements: Height: 6' 1 (185.4 cm) Weight: 90.1 kg (198 lb 10.2 oz) IBW/kg (Calculated) : 79.9 HEPARIN  DW (KG): 90.1  Vital Signs: Temp: 97.7 F (36.5 C) (08/05 1221) Temp Source: Oral (08/05 1221) BP: 155/54 (08/05 1857) Pulse Rate: 96 (08/05 1857)  Labs: Recent Labs    05/20/24 0249 05/21/24 0348 05/21/24 1255 05/22/24 0437 05/22/24 1740  HGB 12.8* 13.6  --  14.2  --   HCT 40.7 43.1  --  44.5  --   PLT 334 381  --  425*  --   LABPROT 34.3* 31.3*  --  17.3*  --   INR 3.2* 2.9*  --  1.3*  --   HEPARINUNFRC  --   --   --   --  <0.10*  CREATININE 1.35* 1.20 1.15  --   --     Estimated Creatinine Clearance: 59.8 mL/min (by C-G formula based on SCr of 1.15 mg/dL).   Assessment: 68 yoM presented to [facility] with recurrent pancreatitis. Patient is on warfarin PTA for Afib (CHADSVASc = 4) and hx recurrent DVT/PE now held for procedure.  Pharmacy consulted to dose heparin .   8/4 Gave vitamin K  IV 1mg  to get INR below 2 for EGD 8/5. ING now 1.3, will start heparin .   PTA warfarin=4mg  sat/sun, 2mg  all other days. INR was 4.1 on 05/01/24 so dose was held x2 days and decreased to 2mg  daily   EGD showed chronic gastritis without bleeding. Heparin  level undetectable on infusion at 1500 units/hr. No issues with line or bleeding reported per RN.  Goal of Therapy:  INR 2-3 Heparin  level 0.3-0.7 units/ml Monitor platelets by anticoagulation protocol: Yes   Plan:   Increase heparin  infusion to 1800 units/hr F/u 8h heparin  level  Vito Ralph, PharmD, BCPS Please see amion for complete clinical pharmacist phone list 05/22/2024 7:01 PM

## 2024-05-22 NOTE — Progress Notes (Signed)
 HD#5 Subjective:  Summary: This is a 78 year old male with past medical history of recurrent pancreatitis, history of CAD status post stent placement, AAA, DVT, aortic stenosis status post TAVR, A-fib on Coumadin  who presents to the emergency department with concerns of sharp abdominal pain and chest pain, and admitted for pancreatitis with Hospital course now complicated by atrial fibrillation with RVR that started on the evening of 8/1 and reoccurred morning 8/3.   Overnight Events: Overnight patient HR was stable in mid 90s. Patient satting 93% on 2L [place].   He feels his breathing is improving as well and has noticed his abdominal tenderness is less as well. He was unable to sleep well due to staff entering his room to check vitals and labs. He has had some nausea but no vomiting. He denies palpitations. He is currently on an all clear liquid diet in anticipation of EGD.  Objective:  Vital signs in last 24 hours: Vitals:   05/22/24 1140 05/22/24 1150 05/22/24 1200 05/22/24 1221  BP: (!) 89/38 106/70 128/74 124/83  Pulse: (!) 103 97 99   Resp: (!) 27 20 17 20   Temp:    97.7 F (36.5 C)  TempSrc:    Oral  SpO2: (!) 88% 92% 95% 92%  Weight:      Height:       Supplemental O2: Nasal Cannula SpO2: 93 % O2 Flow Rate (L/min): 2 L/min   Physical Exam:  Const:: Awake, alert in NAD HENT: Normocephalic, atraumatic Card: RRR, No MRG  Resp: LCTAB, No increased work of breathing Abd:  BSX4. Auscultation does not illicit pain. Patient is able to tolerate percussion and palpation of the abdomen, but has diffuse soreness.  Extremities: No pitting edema on LE's bilaterally  Filed Weights   05/21/24 0400 05/22/24 0500 05/22/24 1035  Weight: 91.7 kg 90.1 kg 90.1 kg     Intake/Output Summary (Last 24 hours) at 05/22/2024 1542 Last data filed at 05/22/2024 1123 Gross per 24 hour  Intake 61.75 ml  Output 1200 ml  Net -1138.25 ml   Net IO Since Admission: -90.56 mL [05/22/24  1542]  Pertinent Labs: PT/INR: 17.3/1.3 Magnesium : 1.7    Latest Ref Rng & Units 05/22/2024    4:37 AM 05/21/2024    3:48 AM 05/20/2024    2:49 AM  CBC  WBC 4.0 - 10.5 K/uL 14.3  12.9  11.7   Hemoglobin 13.0 - 17.0 g/dL 85.7  86.3  87.1   Hematocrit 39.0 - 52.0 % 44.5  43.1  40.7   Platelets 150 - 400 K/uL 425  381  334        Latest Ref Rng & Units 05/21/2024   12:55 PM 05/21/2024    3:48 AM 05/20/2024    2:49 AM  CMP  Glucose 70 - 99 mg/dL 864  873  873   BUN 8 - 23 mg/dL 16  18  25    Creatinine 0.61 - 1.24 mg/dL 8.84  8.79  8.64   Sodium 135 - 145 mmol/L 135  136  137   Potassium 3.5 - 5.1 mmol/L 3.8  3.8  3.4   Chloride 98 - 111 mmol/L 95  99  102   CO2 22 - 32 mmol/L 27  25  23    Calcium  8.9 - 10.3 mg/dL 8.9  8.5  8.1   Total Protein 6.5 - 8.1 g/dL  6.1  5.7   Total Bilirubin 0.0 - 1.2 mg/dL  0.7  0.8   Alkaline Phos 38 -  126 U/L  63  61   AST 15 - 41 U/L  41  42   ALT 0 - 44 U/L  24  22     Imaging: No results found. Endoscopy Impression:  - Normal mucosa was found in the entire esophagus. No esophageal or gastric mass. - 1 cm hiatal hernia.  - Chronic gastritis without bleeding. - Submucosal polyp/ nodule x 3 found in the second portion of duodenum. Incompletely characterized by EGD, but not an acute issue currently. Unlikely of clinical consequence now. - No specimens collected.  Assessment/Plan:   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) Active Problems:   Hyperlipidemia   OSA (obstructive sleep apnea)   PAD (peripheral artery disease) (HCC)   AKI (acute kidney injury) (HCC)   Chronic diastolic CHF (congestive heart failure), NYHA class 2 (HCC)   HTN (hypertension)   Leukocytosis   Acute esophagitis   Paroxysmal atrial fibrillation (HCC)   Atrial fibrillation with RVR (HCC)   Acute respiratory failure with hypoxia (HCC)   Abnormal CT scan, esophagus   Gastritis and gastroduodenitis   Patient Summary: Tony Zhang is a 78 y.o. male with  past medical history of recurrent pancreatitis, history of CAD status post stent placement, AAA, DVT, aortic stenosis status post TAVR, A-fib on Coumadin  who presents to the emergency department with concerns of sharp abdominal pain and chest pain, and admitted for pancreatitis with Hospital course that was complicated by atrial fibrillation with RVR.  #Recurrent pancreatitis First episode of pancreatitis in January 2025, and second episode in June 2025.  Patient presents with his third episode of pancreatitis this admission.  He is no longer on fluids or lasix  due to euvolemia and being on a full liquid diet.  Clinically, patient is having improvement in pain and breathing.  Patient had MRCP 8/2, which revealed no stones, no common bile duct dilatation, inflammatory changes of pancreatitis with no pancreatic necrosis. Patient is s/p cholecystectomy, does not have hypertriglyceridemia, and does not drink alcohol. Pharmacy consulted in regard to medication-induced pancreatitis, and recommended discontinuation of atorvastatin  both during inpatient stay and at home, as well as home fibrate. Patient is on PCSK9 inhibitor Repatha  per pharmacy.  Recommendation of EUS to pursue if patient has recurrence of pancreatitis after this resolves to identify possible microlithiasis. - GI following, appreciate recommendations - Advance diet as tolerated - zofran  injection 4mg  q8h PRN - oxycodone  tablet 5-10mg  q6h PRN - AM CBC  #Esophagitis CT showed evidence of esophagitis.  EGD done today. Results showed chronic gastritis and no esophagitis. Can resume anticoagulation per endoscopy impression and notes.  - Continue Protonix  40 mg twice daily - GI following, appreciate recommendations - AM Protime-INR  #Atrial fibrillation with RVR Night of 8/1 patient went into A-fib with RVR with rates into the 160s and was put on amiodarone  drip.  Patient was well controlled at low 100s 8/2 but morning 8/3 back in afib RVR up to  180s. Was given metoprolol  injection and amiodarone  bolus. Cardio consulted and believe PAF likely precipitated by pancreatitis. Patient was feeling stable with no palpitations this morning. - amiodarone  PO 200 mg - Cardizem  30mg  q6h PO - Cardiology following, appreciate recommendations - Coumadin  restart - Continue monitor on telemetry - On metoprolol  succinate 100 mg daily, holding at this time, can potentially transition to metoprolol  once patient has improved rates  #AKI  Creatinine continuing to improve most recently 1.15 from 1.20.  Likely patient needed diuresis and had a component  of cardiorenal syndrome.  Will continue to monitor fluid status and encourage oral hydration. - Monitor urine output closely - AM BMP  #CAD #HFpEF Patient does have a history of CAD with drug-eluting stent to LAD on 08/23.  No acute concerns for ACS during hospitalization. Suspect diastolic dysfunction and 3rd spacing with pancreatitis per Cardio. Will monitor volume status closely. - d/c home atorvastatin  20 mg daily; patient is on Repatha  at home - Hold home Jardiance  10 mg daily - Strict I's and O's - Daily weights - hold Lasix  40 mg daily, consider IV Lasix   - cardiology following appreciate recs   #Aortic stenosis status post TAVR January 2025 No acute concerns at this time.  However patient needs to hold warfarin in anticipation of EGD, so we will need to bridge with heparin .  INR 1.3 today and EGD was done this morning, so patient may restart warfarin. - Monitor INR - restart warfarin - Heparin  bridge  #Hypertension Patient BP in range of 153-188 systolic and 67-88 diastolic last night and this morning. Appropriate to restart losartan  50 mg from patients home medications. - losartan  50 mg daily - Hold home spironolactone  25 mg daily - Hold home metoprolol  100 mg daily - Anticipate with Starting Dilt, will have some anti hypertensive effect   #Gout -Can resume home allopurinol  300 mg  daily  #COPD Patient has past medical history of COPD.  Home medication includes Symbicort .  No acute concern for exacerbation at this time. Saturating 91% on 1L [place] today with no increased WOB.  - Resume home Symbicort  (hospital formulary is Breo) - Albuterol  as needed - Monitor respiratory status  Diet: Liquid diet IVF: None VTE: INR supratherapeutic on warfarin, awaiting for INR to become subtherapeutic and then start heparin  bridge Code: DNR/DNI PT/OT recs: Pending, none.   Dispo: Anticipated discharge to Home in 3 days pending clinical improvement.   Fayne Cambric, Medical Student 05/22/2024, 3:42 PM   Attestation for Student Documentation:  I personally was present and re-performed the history, physical exam and medical decision-making activities of this service and have verified that the service and findings are accurately documented in the student's note.  Internal Medicine Attending: Appreciate GI and endo (chronic gastritis) Will work on controlling his HTN.    Eben Reyes BROCKS, MD    Eben Reyes BROCKS, MD 05/22/2024, 5:45 PM

## 2024-05-22 NOTE — Progress Notes (Addendum)
 Progress Note  Patient Name: Tony Zhang Date of Encounter: 05/22/2024 [facility] Cardiologist: Jerel Balding, MD   Interval Summary   Continues to have abdominal pain from pancreatitis but pain is improving. Denies shortness of breath, melena, hematuria, and hematochezia.  Vital Signs Vitals:   05/22/24 0358 05/22/24 0500 05/22/24 0734 05/22/24 0756  BP: (!) 188/67   115/87  Pulse: 94   100  Resp: 18   15  Temp: 98.1 F (36.7 C)   98.3 F (36.8 C)  TempSrc: Oral   Oral  SpO2: 96%  93% 95%  Weight:  90.1 kg    Height:        Intake/Output Summary (Last 24 hours) at 05/22/2024 0832 Last data filed at 05/22/2024 9356 Gross per 24 hour  Intake 2371.58 ml  Output 2750 ml  Net -378.42 ml      05/22/2024    5:00 AM 05/21/2024    4:00 AM 05/20/2024    3:08 AM  Last 3 Weights  Weight (lbs) 198 lb 10.2 oz 202 lb 2.6 oz 211 lb 3.2 oz  Weight (kg) 90.1 kg 91.7 kg 95.8 kg      Telemetry/ECG  Normal sinus rhythm with heart rates in the 80's to 100's - Personally Reviewed  Physical Exam  GEN: No acute distress.  On 2L [place]. Neck: No JVD. Assessment limited by beard. Cardiac: RRR, no murmurs, rubs, or gallops.  Respiratory: Clear to auscultation bilaterally. GI: Soft, nontender, non-distended  MS: No edema  Assessment & Plan  Atrial fibrillation CHA2DS2-VASc Score = 5 [CHF History: 1, HTN History: 1, Diabetes History: 0, Stroke History: 0, Vascular Disease History: 1, Age Score: 2, Gender Score: 0].  Therefore, the patient's annual risk of stroke is 7.2 %.    Has a prior history of atrial fibrillation acute exacerbation is in the setting of pancreatitis. Cardiac monitor prior to admission showed an Afib burden of 7%.  Prior to admission was anticoagulated on warfarin this was held so patient could get endoscopy.  Is also on anticoagulation because has a history of recurrent DVT/PE.  Was recently started on heparin . was started on amiodarone  bolus and IV. On 05/21/2024 was  transitioned to oral amiodarone . Remains in NSR and heart rates are fairly well controlled in the 80's to 100's. Continue amiodarone  200 mg twice daily. Continue Cardizem  30 mg every 6 hours. Plan to transition to Cardizem  CD prior to discharge. Continue IV heparin  per pharmacy.  Plan to transition back to warfarin after EGD.   Recurrent pancreatitis: Puzzling why he keeps having new events despite having undergone cholecystectomy and no evidence of residual stones.  He never drinks alcohol.     Esophagitis: I wonder if the changes on the CT could just be attributable to severe nausea and vomiting. GI planning EGD.   AKI: Resolved with intravenous fluids.  He appears clinically euvolemic.  Would avoid aggressive IV fluid administration, since he is at risk for HFpEF exacerbation.   S/P TAVR: Both clinical exam and recent echocardiogram on 04/12/24 suggest normal prosthetic valve function.  No plan to reevaluate.  He does require endocarditis prophylaxis for dental procedures.   CAD HLD Cardiac cath on 09/2023 for TAVR workup showed severe calcified stenosis of RCA, stent patent in mid LAD with moderate nonobstructive CAD, patent LCx, calcified aortic valve leaflets, and moderate pulmonary hypertension.  Lipid panel on 04/2024 showed LDL of 35 at goal of less than 55. Continue atorvastatin  20 mg daily. No need for  aspirin  with anticoagulation.   Hypertension Blood pressure yesterday appears to be elevated.  Was started on losartan  today. Blood pressure better controlled today with most recent BP of 115/87. Continue to monitor BP. Cardizem  30 mg every 6 hours Plan to transition to Cardizem  CD prior to discharge. Continue losartan  50 mg daily.    For questions or updates, please contact [facility] Please consult [email or web address] for contact info under       Signed, Morse Clause, PA-C   I have seen and examined the patient along with [name], PA-C .  I have reviewed the  chart, notes and new data.  I agree with PA/NP's note.  Key new complaints: slow improvement in abdominal pain. No arrhythmia symptoms. No dyspnea lying fully supine. Key examination changes: no overt hypervolemia, mild sinus tachycardia Key new findings / data: WBC 14.3, mildly increased since admission. INR 1.3. No recurrence of AFib, on PO amiodarone .  PLAN: Note GI plan for EGD. If this will not occur today, would like to start IV heparin  until we can restart warfarin.  Eugine Bubb, MD, FACC [facility] [phone] 05/22/2024, 9:40 AM

## 2024-05-22 NOTE — Anesthesia Preprocedure Evaluation (Addendum)
 Anesthesia Evaluation  Patient identified by MRN, date of birth, ID band Patient awake    Reviewed: Allergy & Precautions, NPO status , Patient's Chart, lab work & pertinent test results, reviewed documented beta blocker date and time   Airway Mallampati: II  TM Distance: >3 FB Neck ROM: Full    Dental  (+) Edentulous Upper, Edentulous Lower, Dental Advisory Given   Pulmonary sleep apnea , COPD,  COPD inhaler, former smoker   Pulmonary exam normal breath sounds clear to auscultation       Cardiovascular hypertension, Pt. on home beta blockers and Pt. on medications pulmonary hypertension+ CAD, + Past MI, + Peripheral Vascular Disease, +CHF and + DVT  Normal cardiovascular exam+ dysrhythmias Atrial Fibrillation + Valvular Problems/Murmurs (s/p TAVR 2025)  Rhythm:Regular Rate:Normal  TTE 2025 1. Left ventricular ejection fraction, by estimation, is 60 to 65%. The  left ventricle has normal function. The left ventricle has no regional  wall motion abnormalities. Left ventricular diastolic parameters are  consistent with Grade I diastolic  dysfunction (impaired relaxation).   2. Right ventricular systolic function is mildly reduced. The right  ventricular size is mildly enlarged.   3. Left atrial size was mildly dilated.   4. The mitral valve is normal in structure. No evidence of mitral valve  regurgitation. No evidence of mitral stenosis.   5. The aortic valve has been repaired/replaced. Aortic valve  regurgitation is not visualized. No aortic stenosis is present. There is a  26 mm Edwards Sapien prosthetic (TAVR) valve present in the aortic  position. Procedure Date: 11/08/2023. Echo findings   are consistent with normal structure and function of the aortic valve  prosthesis.   Cath 2024 1.  Severe calcific stenosis of the RCA, unchanged from the previous cardiac catheterization procedure 2.  Continued patency of the stented  segment in the mid LAD with moderate nonobstructive plaquing in the proximal vessel 3.  Patent left circumflex with no high-grade stenosis 4.  Patent left subclavian artery with mild calcified stenosis 5.  Calcified aortic valve leaflets with known severe aortic stenosis by noninvasive assessment 6.  Moderate pulmonary hypertension with mean PA pressure 39 mmHg, transpulmonary gradient 20 mmHg, PVR 4.26 Wood units     Neuro/Psych negative neurological ROS  negative psych ROS   GI/Hepatic Neg liver ROS, hiatal hernia,,,  Endo/Other  diabetes, Type 2, Oral Hypoglycemic Agents    Renal/GU negative Renal ROS  negative genitourinary   Musculoskeletal  (+) Arthritis ,    Abdominal   Peds  Hematology  (+) Blood dyscrasia (coumadin )   Anesthesia Other Findings   Reproductive/Obstetrics                              Anesthesia Physical Anesthesia Plan  ASA: 3  Anesthesia Plan: MAC   Post-op Pain Management:    Induction: Intravenous  PONV Risk Score and Plan: Propofol  infusion and Treatment may vary due to age or medical condition  Airway Management Planned: Natural Airway  Additional Equipment:   Intra-op Plan:   Post-operative Plan:   Informed Consent: I have reviewed the patients History and Physical, chart, labs and discussed the procedure including the risks, benefits and alternatives for the proposed anesthesia with the patient or authorized representative who has indicated his/her understanding and acceptance.   Patient has DNR.  Discussed DNR with patient and Suspend DNR.   Dental advisory given  Plan Discussed with: CRNA  Anesthesia Plan Comments:  Anesthesia Quick Evaluation

## 2024-05-22 NOTE — Transfer of Care (Signed)
 Immediate Anesthesia Transfer of Care Note  Patient: Tony Zhang  Procedure(s) Performed: EGD (ESOPHAGOGASTRODUODENOSCOPY)  Patient Location: PACU  Anesthesia Type:MAC  Level of Consciousness: sedated  Airway & Oxygen Therapy: Patient Spontanous Breathing and Patient connected to face mask oxygen  Post-op Assessment: Report given to RN  Post vital signs: Reviewed and stable  Last Vitals:  Vitals Value Taken Time  BP 115/87 05/22/24 11:21  Temp 36 C 05/22/24 11:21  Pulse 91 05/22/24 11:22  Resp 13 05/22/24 11:22  SpO2 92 % 05/22/24 11:22  Vitals shown include unfiled device data.  Last Pain:  Vitals:   05/22/24 1121  TempSrc: Temporal  PainSc:       Patients Stated Pain Goal: 0 (05/22/24 0126)  Complications: No notable events documented.

## 2024-05-23 ENCOUNTER — Telehealth (HOSPITAL_COMMUNITY): Payer: Self-pay | Admitting: Pharmacy Technician

## 2024-05-23 ENCOUNTER — Ambulatory Visit (HOSPITAL_COMMUNITY)

## 2024-05-23 ENCOUNTER — Other Ambulatory Visit (HOSPITAL_COMMUNITY): Payer: Self-pay

## 2024-05-23 DIAGNOSIS — R933 Abnormal findings on diagnostic imaging of other parts of digestive tract: Secondary | ICD-10-CM

## 2024-05-23 DIAGNOSIS — I4891 Unspecified atrial fibrillation: Secondary | ICD-10-CM | POA: Diagnosis not present

## 2024-05-23 DIAGNOSIS — K209 Esophagitis, unspecified without bleeding: Secondary | ICD-10-CM | POA: Diagnosis not present

## 2024-05-23 LAB — BASIC METABOLIC PANEL WITH GFR
Anion gap: 10 (ref 5–15)
BUN: 15 mg/dL (ref 8–23)
CO2: 28 mmol/L (ref 22–32)
Calcium: 8.5 mg/dL — ABNORMAL LOW (ref 8.9–10.3)
Chloride: 96 mmol/L — ABNORMAL LOW (ref 98–111)
Creatinine, Ser: 1.1 mg/dL (ref 0.61–1.24)
GFR, Estimated: >60 mL/min (ref >60)
Glucose, Bld: 127 mg/dL — ABNORMAL HIGH (ref 70–99)
Potassium: 3.2 mmol/L — ABNORMAL LOW (ref 3.5–5.1)
Sodium: 134 mmol/L — ABNORMAL LOW (ref 135–145)

## 2024-05-23 LAB — CBC
HCT: 42.7 % (ref 39.0–52.0)
Hemoglobin: 13.6 g/dL (ref 13.0–17.0)
MCH: 29 pg (ref 26.0–34.0)
MCHC: 31.9 g/dL (ref 30.0–36.0)
MCV: 91 fL (ref 80.0–100.0)
Platelets: 400 K/uL (ref 150–400)
RBC: 4.69 MIL/uL (ref 4.22–5.81)
RDW: 15.9 % — ABNORMAL HIGH (ref 11.5–15.5)
WBC: 11.8 K/uL — ABNORMAL HIGH (ref 4.0–10.5)
nRBC: 0 % (ref 0.0–0.2)

## 2024-05-23 LAB — PROTIME-INR
INR: 1.2 (ref 0.8–1.2)
Prothrombin Time: 16.1 s — ABNORMAL HIGH (ref 11.4–15.2)

## 2024-05-23 LAB — MAGNESIUM: Magnesium: 1.8 mg/dL (ref 1.7–2.4)

## 2024-05-23 LAB — HEPARIN LEVEL (UNFRACTIONATED): Heparin Unfractionated: 0.1 [IU]/mL — ABNORMAL LOW (ref 0.30–0.70)

## 2024-05-23 MED ORDER — ENOXAPARIN SODIUM 100 MG/ML IJ SOSY
90.0000 mg | PREFILLED_SYRINGE | Freq: Two times a day (BID) | INTRAMUSCULAR | Status: DC
  Administered 2024-05-23 – 2024-05-25 (×5): 90 mg via SUBCUTANEOUS
  Filled 2024-05-23 (×4): qty 1

## 2024-05-23 MED ORDER — METOPROLOL SUCCINATE ER 50 MG PO TB24
50.0000 mg | ORAL_TABLET | Freq: Every day | ORAL | Status: DC
  Administered 2024-05-23 – 2024-05-25 (×3): 50 mg via ORAL
  Filled 2024-05-23 (×3): qty 1

## 2024-05-23 MED ORDER — WARFARIN - PHARMACIST DOSING INPATIENT: Freq: Every day | Status: DC

## 2024-05-23 MED ORDER — WARFARIN SODIUM 2 MG PO TABS
4.0000 mg | ORAL_TABLET | Freq: Every day | ORAL | Status: DC
  Administered 2024-05-23 – 2024-05-24 (×2): 4 mg via ORAL
  Filled 2024-05-23 (×2): qty 2

## 2024-05-23 MED ORDER — POTASSIUM CHLORIDE CRYS ER 20 MEQ PO TBCR
40.0000 meq | EXTENDED_RELEASE_TABLET | Freq: Two times a day (BID) | ORAL | Status: AC
Start: 2024-05-23 — End: 2024-05-23
  Administered 2024-05-23 (×2): 40 meq via ORAL
  Filled 2024-05-23 (×2): qty 2

## 2024-05-23 MED ORDER — WARFARIN SODIUM 2 MG PO TABS: 4.0000 mg | ORAL_TABLET | Freq: Once | ORAL | Status: DC

## 2024-05-23 NOTE — Evaluation (Signed)
 Physical Therapy Evaluation Patient Details Name: Tony Zhang MRN: [id number] DOB: [birth year] Today's Date: 05/23/2024  History of Present Illness  Pt  is a 78 y.o. male admitted 7/31 for chest and abdominal pain. Admitted for pancreatitis, complicated by Afib with RVR on 8/1 & 8/3. GI endoscopy 8/5 found Chronic gastritis without bleeding. Recent admission 03/2024 for interstitial pancreatitis. PMH: COPD, CAD (NSTEMI and s/p DES-LAD 05/2022), heart failure, PAD, TAVR, DVT and PE, HTN, HLD, emphysema, pancreatitis  Clinical Impression  Pt from home alone with intermittent help available from friends. Currently pt is deconditioned after being in bed for 5 days and requires hands on assist for all mobility and requiring supplemental O2 at 4L for activity. Expect he will make good progress with therapy and incr activity. Currently he is declining SNF and wants to go home but knows he isn't ready to manage on his own right now. Expect he will need a couple of days before he likely would reach a level to mobilize on his own in the home.       If plan is discharge home, recommend the following: A little help with walking and/or transfers;A little help with bathing/dressing/bathroom;Assistance with cooking/housework;Help with stairs or ramp for entrance;Assist for transportation   Can travel by private vehicle        Equipment Recommendations None recommended by PT  Recommendations for Other Services       Functional Status Assessment Patient has had a recent decline in their functional status and demonstrates the ability to make significant improvements in function in a reasonable and predictable amount of time.     Precautions / Restrictions Precautions Precautions: Fall Recall of Precautions/Restrictions: Intact Restrictions Weight Bearing Restrictions Per Provider Order: No      Mobility  Bed Mobility Overal bed mobility: Needs Assistance Bed Mobility: Sit to Supine       Sit to  supine: Min assist   General bed mobility comments: Assist to bring feet back up into bed.    Transfers Overall transfer level: Needs assistance Equipment used: Rolling walker (2 wheels) Transfers: Sit to/from Stand Sit to Stand: Min assist           General transfer comment: Assist to power up from low recliner    Ambulation/Gait Ambulation/Gait assistance: Contact guard assist Gait Distance (Feet): 20 Feet Assistive device: Rolling walker (2 wheels) Gait Pattern/deviations: Step-through pattern, Decreased step length - right, Decreased step length - left, Shuffle, Trunk flexed Gait velocity: decr Gait velocity interpretation: <1.8 ft/sec, indicate of risk for recurrent falls   General Gait Details: Assist for safety. Several standing rest breaks with pt propping his elbows on walker  Stairs            Wheelchair Mobility     Tilt Bed    Modified Rankin (Stroke Patients Only)       Balance Overall balance assessment: Needs assistance Sitting-balance support: Feet supported Sitting balance-Leahy Scale: Fair     Standing balance support: Bilateral upper extremity supported, Reliant on assistive device for balance Standing balance-Leahy Scale: Poor Standing balance comment: Reliant on RW                             Pertinent Vitals/Pain Pain Assessment Pain Assessment: Faces Faces Pain Scale: Hurts little more Pain Location: back Pain Descriptors / Indicators: Aching, Grimacing Pain Intervention(s): Limited activity within patient's tolerance, Repositioned, Monitored during session    Home Living Family/patient expects  to be discharged to:: Private residence Living Arrangements: Alone Available Help at Discharge: Friend(s);Available PRN/intermittently Type of Home: Mobile home Home Access: Stairs to enter Entrance Stairs-Rails: Can reach both Entrance Stairs-Number of Steps: 4   Home Layout: One level Home Equipment: Agricultural consultant  (2 wheels);Rollator (4 wheels);Cane - single point;BSC/3in1;Wheelchair - manual      Prior Function Prior Level of Function : Independent/Modified Independent;Driving             Mobility Comments: furniture walking ADLs Comments: ind with ADLs, iADLs, manages own med. uses BSC as shower seat.     Extremity/Trunk Assessment   Upper Extremity Assessment Upper Extremity Assessment: Defer to OT evaluation    Lower Extremity Assessment Lower Extremity Assessment: Generalized weakness    Cervical / Trunk Assessment Cervical / Trunk Assessment: Normal  Communication   Communication Communication: No apparent difficulties    Cognition Arousal: Alert Behavior During Therapy: WFL for tasks assessed/performed   PT - Cognitive impairments: No apparent impairments                         Following commands: Intact       Cueing Cueing Techniques: Verbal cues     General Comments General comments (skin integrity, edema, etc.): Pt on 2L O2 at start of session with SpO2 86%. Increased to 3L with SpO2 87-88%. Incr to 4L with SpO2 90% with activity.    Exercises     Assessment/Plan    PT Assessment Patient needs continued PT services  PT Problem List Decreased strength;Decreased activity tolerance;Decreased balance;Decreased mobility;Cardiopulmonary status limiting activity       PT Treatment Interventions DME instruction;Gait training;Stair training;Functional mobility training;Therapeutic activities;Therapeutic exercise;Balance training;Patient/family education    PT Goals (Current goals can be found in the Care Plan section)  Acute Rehab PT Goals Patient Stated Goal: go home after he is stronger PT Goal Formulation: With patient Time For Goal Achievement: 06/06/24 Potential to Achieve Goals: Good    Frequency Min 3X/week     Co-evaluation               AM-PAC PT 6 Clicks Mobility  Outcome Measure Help needed turning from your back to your  side while in a flat bed without using bedrails?: A Little Help needed moving from lying on your back to sitting on the side of a flat bed without using bedrails?: A Little Help needed moving to and from a bed to a chair (including a wheelchair)?: A Little Help needed standing up from a chair using your arms (e.g., wheelchair or bedside chair)?: A Little Help needed to walk in hospital room?: A Little Help needed climbing 3-5 steps with a railing? : A Lot 6 Click Score: 17    End of Session Equipment Utilized During Treatment: Gait belt;Oxygen Activity Tolerance: Patient limited by fatigue Patient left: in bed;with call bell/phone within reach;with bed alarm set Nurse Communication: Mobility status;Other (comment) (SpO2) PT Visit Diagnosis: Unsteadiness on feet (R26.81);Other abnormalities of gait and mobility (R26.89);Muscle weakness (generalized) (M62.81)    Time: 9079-9057 PT Time Calculation (min) (ACUTE ONLY): 22 min   Charges:   PT Evaluation $PT Eval Moderate Complexity: 1 Mod   PT General Charges $$ ACUTE PT VISIT: 1 Visit         [facility] PT Acute Rehabilitation Services Office [phone]   [place] ORN [facility] 05/23/2024, 10:00 AM

## 2024-05-23 NOTE — Progress Notes (Addendum)
 Progress Note  Patient Name: Tony Zhang Date of Encounter: 05/23/2024 [facility] Cardiologist: Jerel Balding, MD   Interval Summary   Continues to have some DOE.  Vital Signs Vitals:   05/23/24 0319 05/23/24 0600 05/23/24 0754 05/23/24 0813  BP: (!) 97/48 121/63    Pulse: 80 74    Resp: 17 20    Temp: 98.2 F (36.8 C)  (P) 98.6 F (37 C)   TempSrc: Oral  (P) Oral   SpO2: 94% 93%  93%  Weight: 91.1 kg     Height:        Intake/Output Summary (Last 24 hours) at 05/23/2024 0950 Last data filed at 05/23/2024 9378 Gross per 24 hour  Intake 331.75 ml  Output 650 ml  Net -318.25 ml      05/23/2024    3:19 AM 05/22/2024   10:35 AM 05/22/2024    5:00 AM  Last 3 Weights  Weight (lbs) 200 lb 13.4 oz 198 lb 10.2 oz 198 lb 10.2 oz  Weight (kg) 91.1 kg 90.1 kg 90.1 kg      Telemetry/ECG  Had one hour of Afib yesterday, otherwise in NSR with heart rates in the 70-90's. - Personally Reviewed  Physical Exam  GEN: No acute distress.  On 3L [place] Neck: No JVD Cardiac: RRR, no murmurs, rubs, or gallops.  Respiratory: Clear to auscultation bilaterally. GI: Soft, nontender, non-distended  MS: No edema  Assessment & Plan  Atrial fibrillation CHA2DS2-VASc Score = 5 [CHF History: 1, HTN History: 1, Diabetes History: 0, Stroke History: 0, Vascular Disease History: 1, Age Score: 2, Gender Score: 0].  Therefore, the patient's annual risk of stroke is 7.2 %.    Has a prior history of atrial fibrillation acute exacerbation is in the setting of pancreatitis. Cardiac monitor prior to admission showed an Afib burden of 7%.  Prior to admission was anticoagulated on warfarin this was held so patient could get endoscopy.  Is also on anticoagulation because has a history of recurrent DVT/PE.  Was recently started on heparin . was started on amiodarone  bolus and IV. On 05/21/2024 was transitioned to oral amiodarone . Remains in NSR and heart rates are fairly well controlled in the 80's to  100's. Continue amiodarone  200 mg twice daily. Stop Cardizem  30 mg every 6 hours  Star Cardizem  CD 120mg  daily. Following the EGD yesterday the patient's heparin  was stopped. Was put on lovenox  to transition to warfarin. Continue warfarin per pharmacy. Follow up with Dr Balding as scheduled.     Recurrent pancreatitis: Puzzling why he keeps having new events despite having undergone cholecystectomy and no evidence of residual stones.  He never drinks alcohol.       Esophagitis: I wonder if the changes on the CT could just be attributable to severe nausea and vomiting. GI did EGD that found normal esophageal mucosa, a hiatal hernia, no masses, and gastritis without bleeding    AKI: Resolved with intravenous fluids.  He appears clinically euvolemic.  Would avoid aggressive IV fluid administration, since he is at risk for HFpEF exacerbation.    S/P TAVR: Both clinical exam and recent echocardiogram on 04/12/24 suggest normal prosthetic valve function.  No plan to reevaluate.  He does require endocarditis prophylaxis for dental procedures.     CAD HLD Cardiac cath on 09/2023 for TAVR workup showed severe calcified stenosis of RCA, stent patent in mid LAD with moderate nonobstructive CAD, patent LCx, calcified aortic valve leaflets, and moderate pulmonary hypertension.  Lipid panel  on 04/2024 showed LDL of 35 at goal of less than 55. Continue atorvastatin  20 mg daily. No need for aspirin  with anticoagulation.     Hypertension  Blood pressure has been labile with most recent BP of 121/63. Continue to monitor BP. Stop Cardizem  30 mg every 6 hours  Star Cardizem  CD 120mg  daily. Continue losartan  50 mg daily.  Please consult [email or web address] for contact info under       Signed, Morse Clause, PA-C    I have seen and examined the patient along with [name], PA-C .  I have reviewed the chart, notes and new data.  I agree with PA/NP's note.  Key new complaints: no angina, but has lower rib  cage pain with deep breathing Key examination changes: clear lungs, faint systolic AVR murmur;  Key new findings / data: 1 hour AFib w mild RVR yesterday PM, not long after EGD. EGD findings noted.  PLAN: Full anticoagulation with enoxaparin  until INR >2. Resume his usual cardiac PO meds gradually. Prefer metoprolol  over diltiazem  due to CAD. Will use half of previous metoprolol  dose with new amiodarone  regimen. Restart diuretics (furosemide , spironolactone , Jardiance ) when back to normal diet.  [name], MD, FACC [facility] [phone] 05/23/2024, 10:45 AM

## 2024-05-23 NOTE — Evaluation (Signed)
 Occupational Therapy Evaluation Patient Details Name: Tony Zhang MRN: [id number] DOB: 02/14/1946 Today's Date: 05/23/2024   History of Present Illness   Pt  is a 78 y.o. male admitted 7/31 for chest and abdominal pain. Admitted for pancreatitis, complicated by Afib with RVR on 8/1 & 8/3. GI endoscopy 8/5 found Chronic gastritis without bleeding. Recent admission 03/2024 for interstitial pancreatitis. PMH: COPD, CAD (NSTEMI and s/p DES-LAD 05/2022), heart failure, PAD, TAVR, DVT and PE, HTN, HLD, emphysema, pancreatitis     Clinical Impressions Pt admitted based on above, and was seen based on problem list below. PTA pt was independent with ADLs and IADLs, reports driving and primarily furniture walking around house. Today pt is requiring set up  to min  assist for  ADLs. Bed mobility was s for safety and functional transfers are  CGA with use of RW. Educated pt on benefits of continuing post-acute rehab to continue to build strength, balance, and activity tolerance. OT is recommending HHOT, but pt likely to decline. OT will continue to follow acutely to maximize functional independence.        If plan is discharge home, recommend the following:   A little help with walking and/or transfers;A little help with bathing/dressing/bathroom     Functional Status Assessment   Patient has had a recent decline in their functional status and demonstrates the ability to make significant improvements in function in a reasonable and predictable amount of time.     Equipment Recommendations   None recommended by OT      Precautions/Restrictions   Precautions Precautions: Fall Recall of Precautions/Restrictions: Intact Restrictions Weight Bearing Restrictions Per Provider Order: No     Mobility Bed Mobility Overal bed mobility: Needs Assistance Bed Mobility: Supine to Sit     Supine to sit: Supervision     General bed mobility comments: S for safety, HOB, slightly elevated,  reported dizziness upon sitting    Transfers Overall transfer level: Needs assistance Equipment used: Rolling walker (2 wheels) Transfers: Sit to/from Stand, Bed to chair/wheelchair/BSC Sit to Stand: Contact guard assist     Step pivot transfers: Contact guard assist     General transfer comment: CGA for balance with RW      Balance Overall balance assessment: Needs assistance Sitting-balance support: Feet supported Sitting balance-Leahy Scale: Fair     Standing balance support: Bilateral upper extremity supported, Reliant on assistive device for balance Standing balance-Leahy Scale: Poor Standing balance comment: Reliant on RW       ADL either performed or assessed with clinical judgement   ADL Overall ADL's : Needs assistance/impaired Eating/Feeding: Set up;Sitting   Grooming: Set up;Sitting           Upper Body Dressing : Set up;Sitting   Lower Body Dressing: Minimal assistance;Sit to/from stand Lower Body Dressing Details (indicate cue type and reason): Assist to reach BLEs Toilet Transfer: Contact guard assist;Ambulation;Rolling walker (2 wheels) Toilet Transfer Details (indicate cue type and reason): Simulated in room to recliner, short distance Toileting- Clothing Manipulation and Hygiene: Contact guard assist;Sit to/from stand Toileting - Clothing Manipulation Details (indicate cue type and reason): CGA for balance     Functional mobility during ADLs: Contact guard assist;Rolling walker (2 wheels);Cueing for safety General ADL Comments: Decreased activity tolerance, strength, and balance     Vision Baseline Vision/History: 0 No visual deficits Patient Visual Report: No change from baseline Vision Assessment?: No apparent visual deficits            Pertinent Vitals/Pain  Pain Assessment Pain Assessment: No/denies pain     Extremity/Trunk Assessment Upper Extremity Assessment Upper Extremity Assessment: Generalized weakness   Lower Extremity  Assessment Lower Extremity Assessment: Defer to PT evaluation   Cervical / Trunk Assessment Cervical / Trunk Assessment: Normal   Communication Communication Communication: No apparent difficulties   Cognition Arousal: Alert Behavior During Therapy: WFL for tasks assessed/performed Cognition: No apparent impairments             OT - Cognition Comments: Poor safety awareness and judgement, but likely baseline                 Following commands: Intact       Cueing  General Comments   Cueing Techniques: Verbal cues  Elevated BP, inital c/o of dizziness upon sitting EOB, negative for orthostatics, Received on 5L, session performed on 4L, O2 levels >94%, left resting on 3L           Home Living Family/patient expects to be discharged to:: Private residence Living Arrangements: Alone Available Help at Discharge: Friend(s);Available PRN/intermittently Type of Home: Mobile home Home Access: Stairs to enter Entrance Stairs-Number of Steps: 4 Entrance Stairs-Rails: Can reach both Home Layout: One level     Bathroom Shower/Tub: Chief Strategy Officer: Standard Bathroom Accessibility: No   Home Equipment: Agricultural consultant (2 wheels);Rollator (4 wheels);Cane - single point;BSC/3in1;Wheelchair - manual          Prior Functioning/Environment Prior Level of Function : Independent/Modified Independent;Driving     Mobility Comments: furniture walking ADLs Comments: ind with ADLs, iADLs, manages own med. uses BSC as shower seat.    OT Problem List: Decreased strength;Decreased range of motion;Decreased activity tolerance;Impaired balance (sitting and/or standing);Decreased safety awareness;Decreased knowledge of use of DME or AE;Cardiopulmonary status limiting activity   OT Treatment/Interventions: Self-care/ADL training;Therapeutic exercise;Energy conservation;DME and/or AE instruction;Therapeutic activities;Patient/family education;Balance training       OT Goals(Current goals can be found in the care plan section)   Acute Rehab OT Goals Patient Stated Goal: To go home OT Goal Formulation: With patient Time For Goal Achievement: 06/06/24 Potential to Achieve Goals: Good   OT Frequency:  Min 2X/week       AM-PAC OT 6 Clicks Daily Activity     Outcome Measure Help from another person eating meals?: None Help from another person taking care of personal grooming?: A Little Help from another person toileting, which includes using toliet, bedpan, or urinal?: A Little Help from another person bathing (including washing, rinsing, drying)?: A Little Help from another person to put on and taking off regular upper body clothing?: A Little Help from another person to put on and taking off regular lower body clothing?: A Little 6 Click Score: 19   End of Session Equipment Utilized During Treatment: Gait belt;Rolling walker (2 wheels);Oxygen Nurse Communication: Mobility status  Activity Tolerance: Patient tolerated treatment well Patient left: in chair;with call bell/phone within reach;with chair alarm set  OT Visit Diagnosis: Unsteadiness on feet (R26.81);Other abnormalities of gait and mobility (R26.89);Muscle weakness (generalized) (M62.81)                Time: 9260-9185 OT Time Calculation (min): 35 min Charges:  OT General Charges $OT Visit: 1 Visit OT Evaluation $OT Eval Moderate Complexity: 1 Mod OT Treatments $Self Care/Home Management : 8-22 mins  Adrianne BROCKS, OT  Acute Rehabilitation Services Office [phone] Secure chat preferred   Adrianne GORMAN Savers 05/23/2024, 8:31 AM

## 2024-05-23 NOTE — Progress Notes (Signed)
 HD#6 Subjective:  Summary: This is a 78 year old male with past medical history of recurrent pancreatitis, history of CAD status post stent placement, AAA, DVT, aortic stenosis status post TAVR, A-fib on Coumadin  who presents to the emergency department with concerns of sharp abdominal pain and chest pain, and admitted for pancreatitis with Hospital course now complicated by atrial fibrillation with RVR that started on the evening of 8/1 and reoccurred morning 8/3.   Overnight Events: Overnight patient HR was stable in high 80s. He had some instances of lower BPs 97/48, and 95/72 overnight. Patient satting 91% on 3L [place].   Patient was seem ambulating well with walker to his chair with help of OT at time of rounding. Patient notes still having some intermittent abdominal pain.  Objective:  Vital signs in last 24 hours: Vitals:   05/23/24 0600 05/23/24 0754 05/23/24 0813 05/23/24 1144  BP: 121/63 (!) 147/57  (!) 153/52  Pulse: 74 75  80  Resp: 20 16  (!) 21  Temp:  98.6 F (37 C)  (!) 97.5 F (36.4 C)  TempSrc:  Oral  Oral  SpO2: 93% 94% 93% 91%  Weight:      Height:       Supplemental O2: Nasal Cannula SpO2: 93 % O2 Flow Rate (L/min): 2 L/min   Physical Exam:  Const:: Awake, alert in NAD HENT: Normocephalic, atraumatic Card: RRR, No MRG  Resp: LCTAB, No increased work of breathing Abd:  BSX4. Auscultation does not illicit pain. Patient is able to tolerate palpation of the abdomen, but has some soreness.  Extremities: No pitting edema on LE's bilaterally  Filed Weights   05/22/24 0500 05/22/24 1035 05/23/24 0319  Weight: 90.1 kg 90.1 kg 91.1 kg     Intake/Output Summary (Last 24 hours) at 05/23/2024 1528 Last data filed at 05/23/2024 9378 Gross per 24 hour  Intake 270 ml  Output 650 ml  Net -380 ml   Net IO Since Admission: -470.56 mL [05/23/24 1528]  Pertinent Labs: PT/INR: 16.1/1.2 Magnesium : 1.8    Latest Ref Rng & Units 05/23/2024    3:42 AM 05/22/2024    4:37 AM  05/21/2024    3:48 AM  CBC  WBC 4.0 - 10.5 K/uL 11.8  14.3  12.9   Hemoglobin 13.0 - 17.0 g/dL 86.3  85.7  86.3   Hematocrit 39.0 - 52.0 % 42.7  44.5  43.1   Platelets 150 - 400 K/uL 400  425  381        Latest Ref Rng & Units 05/23/2024    3:42 AM 05/21/2024   12:55 PM 05/21/2024    3:48 AM  CMP  Glucose 70 - 99 mg/dL 872  864  873   BUN 8 - 23 mg/dL 15  16  18    Creatinine 0.61 - 1.24 mg/dL 8.89  8.84  8.79   Sodium 135 - 145 mmol/L 134  135  136   Potassium 3.5 - 5.1 mmol/L 3.2  3.8  3.8   Chloride 98 - 111 mmol/L 96  95  99   CO2 22 - 32 mmol/L 28  27  25    Calcium  8.9 - 10.3 mg/dL 8.5  8.9  8.5   Total Protein 6.5 - 8.1 g/dL   6.1   Total Bilirubin 0.0 - 1.2 mg/dL   0.7   Alkaline Phos 38 - 126 U/L   63   AST 15 - 41 U/L   41   ALT 0 - 44 U/L  24     Imaging: No results found. Endoscopy Impression:  - Normal mucosa was found in the entire esophagus. No esophageal or gastric mass. - 1 cm hiatal hernia.  - Chronic gastritis without bleeding. - Submucosal polyp/ nodule x 3 found in the second portion of duodenum. Incompletely characterized by EGD, but not an acute issue currently. Unlikely of clinical consequence now. - No specimens collected.  Assessment/Plan:   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) Active Problems:   Hyperlipidemia   OSA (obstructive sleep apnea)   PAD (peripheral artery disease) (HCC)   AKI (acute kidney injury) (HCC)   Chronic diastolic CHF (congestive heart failure), NYHA class 2 (HCC)   HTN (hypertension)   Leukocytosis   Acute esophagitis   Paroxysmal atrial fibrillation (HCC)   Atrial fibrillation with RVR (HCC)   Acute respiratory failure with hypoxia (HCC)   Abnormal CT scan, esophagus   Gastritis and gastroduodenitis   Patient Summary: Tony Zhang is a 78 y.o. male with past medical history of recurrent pancreatitis, history of CAD status post stent placement, AAA, DVT, aortic stenosis status post TAVR, A-fib on  Coumadin  who presents to the emergency department with concerns of sharp abdominal pain and chest pain, and admitted for pancreatitis with Hospital course that was complicated by atrial fibrillation with RVR.  #Recurrent pancreatitis First episode of pancreatitis in January 2025, and second episode in June 2025.  Patient presents with his third episode of pancreatitis this admission.  He is no longer on fluids or lasix  due to euvolemia and being on a full liquid diet.  Clinically, patient is having improvement in pain and breathing.  Patient had MRCP 8/2, which revealed no stones, no common bile duct dilatation, inflammatory changes of pancreatitis with no pancreatic necrosis. Patient is s/p cholecystectomy, does not have hypertriglyceridemia, and does not drink alcohol. Pharmacy consulted in regard to medication-induced pancreatitis, and recommended discontinuation of atorvastatin  both during inpatient stay and at home, as well as home fibrate. Patient is on PCSK9 inhibitor Repatha  per pharmacy.  Recommendation of EUS to pursue if patient has recurrence of pancreatitis after this resolves to identify possible microlithiasis. His abdominal pain seems to be improving and he feels pain less consistently. PT and OT evaluated patient and recommend few more days of consistent evaluations before patient can go home to gain strength.  - GI following, appreciate recommendations - Advance diet as tolerated - zofran  injection 4mg  q8h PRN - oxycodone  tablet 5-10mg  q6h PRN - AM CBC  #Esophagitis CT showed evidence of esophagitis.  EGD done 8/5. Results showed chronic gastritis and no esophagitis. Can resume anticoagulation per endoscopy impression and notes.  - Continue Protonix  40 mg twice daily - GI following, appreciate recommendations - AM Protime-INR  #Atrial fibrillation with RVR Night of 8/1 patient went into A-fib with RVR with rates into the 160s and was put on amiodarone  drip.  Patient was well  controlled at low 100s 8/2 but morning 8/3 back in afib RVR up to 180s. Was given metoprolol  injection and amiodarone  bolus. Cardio consulted and believe PAF likely precipitated by pancreatitis. Patient was feeling stable with no palpitations this morning. - amiodarone  PO 200 mg - Cardizem  30mg  q6h PO - Cardiology following, appreciate recommendations - continue warfarin - Continue monitor on telemetry - On metoprolol  succinate 100 mg daily, holding at this time, can potentially transition to metoprolol  once patient has improved rates  #Hypokalemia BMP this morning showed K 3.2. Mg is 1.8. Kcl ordered  to replenish.  - KCL  - AM BMP -AM Mg  #AKI  Creatinine continuing to improve most recently 1.10 from 1.15.  Likely patient needed diuresis and had a component of cardiorenal syndrome.  Will continue to monitor fluid status and encourage oral hydration. - Monitor urine output closely - AM BMP  #CAD #HFpEF Patient does have a history of CAD with drug-eluting stent to LAD on 08/23.  No acute concerns for ACS during hospitalization. Suspect diastolic dysfunction and 3rd spacing with pancreatitis per Cardio. Will monitor volume status closely. - d/c home atorvastatin  20 mg daily; patient is on Repatha  at home - Hold home Jardiance  10 mg daily - Strict I's and O's - Daily weights - cardiology following appreciate recs   #Aortic stenosis status post TAVR January 2025 No acute concerns at this time.  However patient needs to hold warfarin in anticipation of EGD, so we will need to bridge with heparin .  INR 1.2 today and EGD was done this 8/5, so patient may restart warfarin. - Monitor INR - continue warfarin - Heparin  bridge  #Hypertension Patient BP in range of 153-188 systolic and 67-88 diastolic last night and this morning. Appropriate to restart losartan  50 mg from patients home medications. - losartan  50 mg daily - Hold home spironolactone  25 mg daily - Hold home metoprolol  100 mg  daily - Anticipate with Starting Dilt, will have some anti hypertensive effect   #Gout -Can resume home allopurinol  300 mg daily  #COPD Patient has past medical history of COPD.  Home medication includes Symbicort .  No acute concern for exacerbation at this time. Saturating 91% on 1L  today with no increased WOB.  - Resume home Symbicort  (hospital formulary is Breo) - Albuterol  as needed - Monitor respiratory status  Diet: soft diet IVF: None VTE: INR supratherapeutic on warfarin, awaiting for INR to become subtherapeutic and then start heparin  bridge Code: DNR/DNI PT/OT recs: Pending, none.   Dispo: Anticipated discharge to Home pending clinical improvement.  Tony Zhang, Medical Student 05/23/2024, 3:28 PM  Attestation for Student Documentation:  I personally was present and re-performed the history, physical exam and medical decision-making activities of this service and have verified that the service and findings are accurately documented in the student's note. Working on d/c plan, bridge to coumadin  with LMWH.  Appreciate PT/OT Tony Reyes BROCKS, MD     Tony Reyes BROCKS, MD 05/23/2024, 4:04 PM

## 2024-05-23 NOTE — Progress Notes (Signed)
 Patient educated with regard to self administration of Lovenox .  Patient expressed understanding of the process and demonstrated by giving himself his Lovenox  shot this morning.

## 2024-05-23 NOTE — Progress Notes (Signed)
 Mobility Specialist Progress Note:    05/23/24 1433  Mobility  Activity Ambulated with assistance  Level of Assistance Minimal assist, patient does 75% or more  Assistive Device Front wheel walker  Distance Ambulated (ft) 20 ft  Activity Response Tolerated well  Mobility Referral Yes  Mobility visit 1 Mobility  Mobility Specialist Start Time (ACUTE ONLY) 1433  Mobility Specialist Stop Time (ACUTE ONLY) 1445  Mobility Specialist Time Calculation (min) (ACUTE ONLY) 12 min   Pt received in bed, agreeable to mobility session. Pt reported dizziness when standing, BP159/79 (102). Pt rolled into hallway with chair. Ambulated 70ft, took sitting rest break. Ambulated another 10 ft, then returned to room via chair. Reported weakness and dizziness again, BP 114/92 (100). Pt sitting up in chair with all needs met, call bell in reach.    Vaida Kerchner Mobility Specialist Please contact via Special educational needs teacher or  Rehab office at [phone]

## 2024-05-23 NOTE — Telephone Encounter (Signed)
 Patient Product/process development scientist completed.    The patient is insured through [facility]. Patient has Medicare and is not eligible for a copay card, but may be able to apply for patient assistance or Medicare RX Payment Plan (Patient Must reach out to their plan, if eligible for payment plan), if available.    Ran test claim for enoxaparin  (Lovenox ) 100 mg/ml and the current 5 day co-pay is $0.00.   This test claim was processed through [facility]- copay amounts may vary at other pharmacies due to pharmacy/plan contracts, or as the patient moves through the different stages of their insurance plan.     Reyes Sharps, CPHT Pharmacy Technician III Certified Patient Advocate [facility] Patient Advocate Team Direct Number: [phone]  Fax: [phone]

## 2024-05-23 NOTE — Anesthesia Postprocedure Evaluation (Signed)
 Anesthesia Post Note  Patient: Tony Zhang  Procedure(s) Performed: EGD (ESOPHAGOGASTRODUODENOSCOPY)     Patient location during evaluation: Endoscopy Anesthesia Type: MAC Level of consciousness: awake and alert Pain management: pain level controlled Vital Signs Assessment: post-procedure vital signs reviewed and stable Respiratory status: spontaneous breathing, nonlabored ventilation, respiratory function stable and patient connected to nasal cannula oxygen Cardiovascular status: stable and blood pressure returned to baseline Postop Assessment: no apparent nausea or vomiting Anesthetic complications: no   No notable events documented.  Last Vitals:  Vitals:   05/23/24 0754 05/23/24 0813  BP: (!) 147/57   Pulse: 75   Resp: 16   Temp: 37 C   SpO2: 94% 93%    Last Pain:  Vitals:   05/23/24 0913  TempSrc:   PainSc: 1                  Tony Zhang

## 2024-05-23 NOTE — Progress Notes (Addendum)
 PHARMACY - ANTICOAGULATION CONSULT NOTE  Pharmacy Consult for warfarin >> heparin /warfarin Indication: hx of DVT/PE, afib   Allergies  Allergen Reactions   Codeine Hives    Patient Measurements: Height: 6' 1 (185.4 cm) Weight: 91.1 kg (200 lb 13.4 oz) IBW/kg (Calculated) : 79.9 HEPARIN  DW (KG): 90.1  Vital Signs: Temp: 98.2 F (36.8 C) (08/06 0319) Temp Source: Oral (08/06 0319) BP: 97/48 (08/06 0319) Pulse Rate: 80 (08/06 0319)  Labs: Recent Labs    05/21/24 0348 05/21/24 1255 05/22/24 0437 05/22/24 1740 05/23/24 0342  HGB 13.6  --  14.2  --  13.6  HCT 43.1  --  44.5  --  42.7  PLT 381  --  425*  --  400  LABPROT 31.3*  --  17.3*  --  16.1*  INR 2.9*  --  1.3*  --  1.2  HEPARINUNFRC  --   --   --  <0.10* 0.10*  CREATININE 1.20 1.15  --   --   --     Estimated Creatinine Clearance: 59.8 mL/min (by C-G formula based on SCr of 1.15 mg/dL).   Assessment: 62 yoM presented [facility] with recurrent pancreatitis. Patient is on warfarin PTA for Afib (CHADSVASc = 4) and hx recurrent DVT/PE now held for procedure.  Pharmacy consulted to dose heparin .   AM: heparin  level below goal on 1800 units/hr. Per RN, no issues with heparin  infusion running continuously. Hgb stable, plts WNL. EGD showed chronic gastritis without bleeding.   Goal of Therapy:  INR 2-3 Heparin  level 0.3-0.7 units/ml Monitor platelets by anticoagulation protocol: Yes   Plan:   Increase heparin  infusion to 2100 units/hr F/u 8h heparin  level CBC daily Follow up Coumadin  restart  Lynwood Poplar, PharmD, BCPS Clinical Pharmacist 05/23/2024 4:31 AM   ADDENDUM -Pharmacy consulted to restart warfarin given no bleeding on EGD. PTA warfarin regimen was 4mg  PO sat/sun, 2mg  all other days. INR today 1.2  Goal of Therapy:  INR 2-3 Heparin  level 0.3-0.7 units/ml Monitor platelets by anticoagulation protocol: Yes   Plan:   Give warfarin 4mg  today (doubled of PTA regimen) INR/CBC daily   Lynwood Poplar,  PharmD, BCPS Clinical Pharmacist 05/23/2024 5:18 AM

## 2024-05-23 NOTE — Progress Notes (Signed)
 PHARMACY - ANTICOAGULATION CONSULT NOTE  Pharmacy Consult for enoxaparin /warfarin Indication: hx of DVT/PE, afib   Allergies  Allergen Reactions   Codeine Hives    Patient Measurements: Height: 6' 1 (185.4 cm) Weight: 91.1 kg (200 lb 13.4 oz) IBW/kg (Calculated) : 79.9 HEPARIN  DW (KG): 90.1  Vital Signs: Temp: (P) 98.6 F (37 C) (08/06 0754) Temp Source: (P) Oral (08/06 0754) BP: 121/63 (08/06 0600) Pulse Rate: 74 (08/06 0600)  Labs: Recent Labs    05/21/24 0348 05/21/24 1255 05/22/24 0437 05/22/24 1740 05/23/24 0342  HGB 13.6  --  14.2  --  13.6  HCT 43.1  --  44.5  --  42.7  PLT 381  --  425*  --  400  LABPROT 31.3*  --  17.3*  --  16.1*  INR 2.9*  --  1.3*  --  1.2  HEPARINUNFRC  --   --   --  <0.10* 0.10*  CREATININE 1.20 1.15  --   --  1.10    Estimated Creatinine Clearance: 62.5 mL/min (by C-G formula based on SCr of 1.1 mg/dL).   Assessment: 76 yoM presented to [facility] with recurrent pancreatitis. Patient is on warfarin PTA for Afib (CHADSVASc = 4) and hx recurrent DVT/PE now held for procedure.  Pharmacy consulted to restart warfarin with an enoxaparin  bridge for discharge post-EGD given no evidence of bleeding. Patient also received a 5 mg dose of vitamin K  on 8/4.  PTA warfarin regimen: 4mg  PO sat/sun, 2mg  all other days.   INR today 1.2. Hgb and platelets are stable. No signs of bleeding noted.   Goal of Therapy:  INR 2-3 Monitor platelets by anticoagulation protocol: Yes   Plan:   - Discontinue heparin  - Start enoxaparin  90 mg subcutaneously BID until clinic           appointment (anticipating appointment for 8/8) - Start warfarin 4 mg daily until clinic appointment  - Monitor INR, CBC, and signs of bleeding daily   B. Amon Rocher, PharmD PGY-1 Pharmacy Resident [facility] 05/23/2024 10:06 AM

## 2024-05-23 NOTE — Progress Notes (Signed)
 Daily Progress Note  DOA: 05/17/2024 Hospital Day: 7   Patient Profile:    78 yo male with a past medical history of COPD, Afib on warfarin, severe PAD, AS s/p TAVR. Admitted with recurrent pancreatitis. See 8/1 GI consult note    ASSESSMENT    Recurrent acute pancreatitis No definite cause, possibly retained microlithiasis.  TODAY>> Abdominal pain improving. Has soft diet ordered but only wanted clears for lunch. He plans to try solids for dinner    Abnormal stomach / distal esophagus on imaging.  EGD yesterday gastritis, HH but no esophageal abnormalities.   Duodenal nodule / polyp Not removed / biopsied yesterday ( requiring anticoagulation).   Normal screening colonoscopy in 2016 Possibly doesn't need another colonoscopy given age  Afib, on warfarin   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) Active Problems:   Hyperlipidemia   OSA (obstructive sleep apnea)   PAD (peripheral artery disease) (HCC)   AKI (acute kidney injury) (HCC)   Chronic diastolic CHF (congestive heart failure), NYHA class 2 (HCC)   HTN (hypertension)   Leukocytosis   Acute esophagitis   Paroxysmal atrial fibrillation (HCC)   Atrial fibrillation with RVR (HCC)   Acute respiratory failure with hypoxia (HCC)   Abnormal CT scan, esophagus   Gastritis and gastroduodenitis   PLAN   --If pancreatitis recurs then may need EUS to evaluate CBD for microlithiasis and also to characterize small bowel lesions. Otherwise, ? needs repeat EGD at some point to reevaluate the nodules  Subjective   Abdominal pain improving.  Has soft diet ordered but only wanted clears for lunch.  Plans to try solid food for dinner   Objective   GI Studies:   EGD 05/22/24 -Normal mucosa throughout entire esophagus.  -1 cm HH -Chronic gastritis -Submucosal polyp / nodule x 3 in 2nd portion of duodenum. No specimens collect.    Recent Labs    05/21/24 0348 05/22/24 0437 05/23/24 0342  WBC  12.9* 14.3* 11.8*  HGB 13.6 14.2 13.6  HCT 43.1 44.5 42.7  MCV 91.1 91.2 91.0  PLT 381 425* 400   No results for input(s): FOLATE, VITAMINB12, FERRITIN, TIBC, IRONPCTSAT in the last 72 hours. Recent Labs    05/21/24 0348 05/21/24 1255 05/23/24 0342  NA 136 135 134*  K 3.8 3.8 3.2*  CL 99 95* 96*  CO2 25 27 28   GLUCOSE 126* 135* 127*  BUN 18 16 15   CREATININE 1.20 1.15 1.10  CALCIUM  8.5* 8.9 8.5*   Recent Labs    05/21/24 0348  PROT 6.1*  ALBUMIN 2.0*  AST 41  ALT 24  ALKPHOS 63  BILITOT 0.7      Imaging:  MR 3D Recon At Scanner CLINICAL DATA:  Persistent pancreatitis.  EXAM: MRI ABDOMEN WITHOUT AND WITH CONTRAST (INCLUDING MRCP)  TECHNIQUE: Multiplanar multisequence MR imaging of the abdomen was performed both before and after the administration of intravenous contrast. Heavily T2-weighted images of the biliary and pancreatic ducts were obtained, and three-dimensional MRCP images were rendered by post processing.  CONTRAST:  10mL GADAVIST  GADOBUTROL  1 MMOL/ML IV SOLN  COMPARISON:  CT scan 05/17/2024 and MRI 04/14/2023  FINDINGS: Examination limited by breathing motion artifact.  Lower chest: Small bilateral pleural effusions and bibasilar atelectasis.  Hepatobiliary: No hepatic lesions or intrahepatic biliary dilatation. The gallbladder is surgically absent. No common bile duct dilatation. No definite common bile duct stones.  Pancreas: Peripancreatic inflammatory changes consistent with pancreatitis but the pancreatic parenchyma appears  relatively normal. Normal enhancement without findings suspicious for pancreatic necrosis. Normal caliber and course of the main pancreatic duct. No findings suspicious for pancreatic divisum. No pancreatic or peripancreatic pseudocysts.  Spleen: Stable mild splenomegaly. No splenic lesions. Stable moderate-sized accessory spleen.  Adrenals/Urinary Tract: The adrenal glands and kidneys are  stable. Bilateral renal cysts are unchanged.  Stomach/Bowel: The stomach is moderately distended. Hiatal hernia noted along with significant inflammation/edema surrounding the distal esophagus and GE junction region. The duodenum, visualized small bowel and visualized colon grossly.  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 No abdominal adenopathy.  Other: Residual inflammations/edema in the mesentery and retroperitoneum and in the perinephric spaces. No discrete fluid collection to suggest abscess, hematoma or pseudocyst.  Musculoskeletal: No significant bony findings.  IMPRESSION: 1. Peripancreatic inflammatory changes consistent with pancreatitis but no findings suspicious for pancreatic necrosis. Normal caliber and course of the main pancreatic duct. No findings suspicious for pancreatic divisum. 2. Status post cholecystectomy. No common bile duct dilatation or definite common bile duct stones. 3. 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 4. Small bilateral pleural effusions and bibasilar atelectasis.  Electronically Signed   By: MYRTIS Stammer M.D.   On: 05/19/2024 18:00     Scheduled inpatient medications:   allopurinol   300 mg Oral Daily   amiodarone   200 mg Oral BID   enoxaparin  (LOVENOX ) injection  90 mg Subcutaneous Q12H   feeding supplement  237 mL Oral BID BM   fluticasone  furoate-vilanterol  1 puff Inhalation Daily   losartan   50 mg Oral Daily   metoprolol  succinate  50 mg Oral Daily   pantoprazole  (PROTONIX ) IV  40 mg Intravenous Q12H   potassium chloride   40 mEq Oral BID   senna  2 tablet Oral QHS   warfarin  4 mg Oral Daily   Warfarin - Pharmacist Dosing Inpatient   Does not  apply q1600   Continuous inpatient infusions:  PRN inpatient medications: acetaminophen  **OR** acetaminophen , albuterol , ondansetron  (ZOFRAN ) IV, oxyCODONE , polyethylene glycol  Vital signs in last 24 hours: Temp:  [97.5 F (36.4 C)-98.6 F (37 C)] 97.5 F (36.4 C) (08/06 1144) Pulse Rate:  [74-104] 80 (08/06 1144) Resp:  [14-21] 21 (08/06 1144) BP: (95-157)/(46-83) 153/52 (08/06 1144) SpO2:  [91 %-95 %] 91 % (08/06 1144) Weight:  [91.1 kg] 91.1 kg (08/06 0319) Last BM Date : 05/17/24  Intake/Output Summary (Last 24 hours) at 05/23/2024 1155 Last data filed at 05/23/2024 9378 Gross per 24 hour  Intake 270 ml  Output 650 ml  Net -380 ml    Intake/Output from previous day: 08/05 0701 - 08/06 0700 In: 331.8 [P.O.:270; I.V.:61.8] Out: 650 [Urine:650] Intake/Output this shift: No intake/output data recorded.   Physical Exam:  General: Alert male in NAD Heart:  Regular rate and rhythm.  Pulmonary: Normal respiratory effort Abdomen: Soft, nondistended, mild generalized tenderness.  Normal bowel sounds. Neurologic: Alert and oriented Psych: Pleasant. Cooperative     LOS: 6 days   Vina Dasen ,NP 05/23/2024, 11:55 AM

## 2024-05-24 ENCOUNTER — Encounter (HOSPITAL_COMMUNITY): Payer: Self-pay | Admitting: Internal Medicine

## 2024-05-24 ENCOUNTER — Other Ambulatory Visit (HOSPITAL_COMMUNITY): Payer: Self-pay

## 2024-05-24 DIAGNOSIS — I4891 Unspecified atrial fibrillation: Secondary | ICD-10-CM | POA: Diagnosis not present

## 2024-05-24 DIAGNOSIS — I5032 Chronic diastolic (congestive) heart failure: Secondary | ICD-10-CM | POA: Diagnosis not present

## 2024-05-24 DIAGNOSIS — I10 Essential (primary) hypertension: Secondary | ICD-10-CM | POA: Diagnosis not present

## 2024-05-24 DIAGNOSIS — I4811 Longstanding persistent atrial fibrillation: Secondary | ICD-10-CM

## 2024-05-24 LAB — COMPREHENSIVE METABOLIC PANEL WITH GFR
ALT: 26 U/L (ref 0–44)
AST: 38 U/L (ref 15–41)
Albumin: 2.1 g/dL — ABNORMAL LOW (ref 3.5–5.0)
Alkaline Phosphatase: 58 U/L (ref 38–126)
Anion gap: 7 (ref 5–15)
BUN: 19 mg/dL (ref 8–23)
CO2: 29 mmol/L (ref 22–32)
Calcium: 8.8 mg/dL — ABNORMAL LOW (ref 8.9–10.3)
Chloride: 98 mmol/L (ref 98–111)
Creatinine, Ser: 1.42 mg/dL — ABNORMAL HIGH (ref 0.61–1.24)
GFR, Estimated: 51 mL/min — ABNORMAL LOW (ref >60)
Glucose, Bld: 116 mg/dL — ABNORMAL HIGH (ref 70–99)
Potassium: 5.1 mmol/L (ref 3.5–5.1)
Sodium: 134 mmol/L — ABNORMAL LOW (ref 135–145)
Total Bilirubin: 0.6 mg/dL (ref 0.0–1.2)
Total Protein: 5.9 g/dL — ABNORMAL LOW (ref 6.5–8.1)

## 2024-05-24 LAB — BASIC METABOLIC PANEL WITH GFR
Anion gap: 12 (ref 5–15)
BUN: 19 mg/dL (ref 8–23)
CO2: 23 mmol/L (ref 22–32)
Calcium: 8.8 mg/dL — ABNORMAL LOW (ref 8.9–10.3)
Chloride: 100 mmol/L (ref 98–111)
Creatinine, Ser: 1.3 mg/dL — ABNORMAL HIGH (ref 0.61–1.24)
GFR, Estimated: 56 mL/min — ABNORMAL LOW (ref >60)
Glucose, Bld: 160 mg/dL — ABNORMAL HIGH (ref 70–99)
Potassium: 4.5 mmol/L (ref 3.5–5.1)
Sodium: 135 mmol/L (ref 135–145)

## 2024-05-24 LAB — CBC
HCT: 42.7 % (ref 39.0–52.0)
Hemoglobin: 13.4 g/dL (ref 13.0–17.0)
MCH: 29 pg (ref 26.0–34.0)
MCHC: 31.4 g/dL (ref 30.0–36.0)
MCV: 92.4 fL (ref 80.0–100.0)
Platelets: 433 K/uL — ABNORMAL HIGH (ref 150–400)
RBC: 4.62 MIL/uL (ref 4.22–5.81)
RDW: 15.9 % — ABNORMAL HIGH (ref 11.5–15.5)
WBC: 15.4 K/uL — ABNORMAL HIGH (ref 4.0–10.5)
nRBC: 0 % (ref 0.0–0.2)

## 2024-05-24 LAB — PROTIME-INR
INR: 1.3 — ABNORMAL HIGH (ref 0.8–1.2)
Prothrombin Time: 16.7 s — ABNORMAL HIGH (ref 11.4–15.2)

## 2024-05-24 MED ORDER — FUROSEMIDE 20 MG PO TABS: 20.0000 mg | ORAL_TABLET | Freq: Every day | ORAL | Status: DC | PRN

## 2024-05-24 MED ORDER — ENOXAPARIN SODIUM 100 MG/ML IJ SOSY
90.0000 mg | PREFILLED_SYRINGE | Freq: Two times a day (BID) | INTRAMUSCULAR | 0 refills | Status: DC
  Filled 2024-05-24: qty 10, 6d supply, fill #0

## 2024-05-24 MED ORDER — ACETAMINOPHEN 325 MG PO TABS: 650.0000 mg | ORAL_TABLET | Freq: Four times a day (QID) | ORAL | Status: AC | PRN

## 2024-05-24 MED ORDER — PANTOPRAZOLE SODIUM 40 MG PO TBEC
40.0000 mg | DELAYED_RELEASE_TABLET | Freq: Two times a day (BID) | ORAL | 0 refills | Status: DC
  Filled 2024-05-24: qty 60, 30d supply, fill #0

## 2024-05-24 MED ORDER — AMIODARONE HCL 200 MG PO TABS
ORAL_TABLET | ORAL | 0 refills | Status: DC
  Filled 2024-05-24: qty 40, 30d supply, fill #0

## 2024-05-24 MED ORDER — WARFARIN SODIUM 4 MG PO TABS
4.0000 mg | ORAL_TABLET | Freq: Every day | ORAL | 0 refills | Status: AC
  Filled 2024-05-24: qty 30, 30d supply, fill #0

## 2024-05-24 MED ORDER — EMPAGLIFLOZIN 10 MG PO TABS
10.0000 mg | ORAL_TABLET | Freq: Every day | ORAL | Status: DC
  Administered 2024-05-24 – 2024-05-25 (×2): 10 mg via ORAL
  Filled 2024-05-24 (×2): qty 1

## 2024-05-24 MED ORDER — FUROSEMIDE 20 MG PO TABS
20.0000 mg | ORAL_TABLET | Freq: Every day | ORAL | 0 refills | Status: AC | PRN
  Filled 2024-05-24: qty 30, 30d supply, fill #0

## 2024-05-24 MED ORDER — METOPROLOL SUCCINATE ER 50 MG PO TB24
50.0000 mg | ORAL_TABLET | Freq: Every day | ORAL | 0 refills | Status: AC
  Filled 2024-05-24: qty 30, 30d supply, fill #0

## 2024-05-24 MED ORDER — PANTOPRAZOLE SODIUM 40 MG PO TBEC
40.0000 mg | DELAYED_RELEASE_TABLET | Freq: Two times a day (BID) | ORAL | Status: DC
  Administered 2024-05-24 – 2024-05-25 (×2): 40 mg via ORAL
  Filled 2024-05-24 (×2): qty 1

## 2024-05-24 NOTE — Progress Notes (Addendum)
 Progress Note  Patient Name: Tony Zhang Date of Encounter: 05/24/2024 [facility] Cardiologist: Jerel Balding, MD   Interval Summary   Was lying fairly flat in bed during interview.  Denies any recent chest pain, lower extremity edema, or shortness of breath.  Walked yesterday with PT but said he felt slightly nauseated.  Vital Signs Vitals:   05/23/24 2304 05/24/24 0331 05/24/24 0450 05/24/24 0810  BP: (!) 114/45 135/70  (!) 162/55  Pulse: 85 79 72 79  Resp: 20 19 18 19   Temp: 99.2 F (37.3 C) 98.7 F (37.1 C)  97.9 F (36.6 C)  TempSrc: Oral Oral  Oral  SpO2: 93% 95% 92% 94%  Weight:   91.4 kg   Height:        Intake/Output Summary (Last 24 hours) at 05/24/2024 0909 Last data filed at 05/24/2024 0331 Gross per 24 hour  Intake --  Output 500 ml  Net -500 ml      05/24/2024    4:50 AM 05/23/2024    3:19 AM 05/22/2024   10:35 AM  Last 3 Weights  Weight (lbs) 201 lb 8 oz 200 lb 13.4 oz 198 lb 10.2 oz  Weight (kg) 91.4 kg 91.1 kg 90.1 kg      Telemetry/ECG  Normal sinus rhythm with resting heart rates in the 70s to 90s- Personally Reviewed  Physical Exam  GEN: No acute distress.   Neck: No JVD Cardiac: RRR, no murmurs, rubs, or gallops.  Respiratory: Mild wheezing heard throughout the lungs GI: Has a slightly tender abdomen but is improving. MS: No edema  Assessment & Plan  78 y.o. male with a history of CAD (NSTEMI and DES-LAD), AAA, chronic distal aortic occlusion, DVT, HLD, HTN, chronic diastolic CHF, PAD, AS s/p TAVR, and atrial fibrillation    Atrial fibrillation CHA2DS2-VASc Score = 5 [CHF History: 1, HTN History: 1, Diabetes History: 0, Stroke History: 0, Vascular Disease History: 1, Age Score: 2, Gender Score: 0].  Therefore, the patient's annual risk of stroke is 7.2 %.    Has a prior history of atrial fibrillation acute exacerbation is in the setting of pancreatitis. Cardiac monitor prior to admission showed an Afib burden of 7%.  Prior to  admission was anticoagulated on warfarin this was held so patient could get endoscopy.  Is also on anticoagulation because has a history of recurrent DVT/PE.  Was recently started on heparin . was started on amiodarone  bolus and IV. On 05/21/2024 was transitioned to oral amiodarone . Remains in NSR and heart rates are fairly well controlled in the 80's to 100's. Continue amiodarone  200 mg twice daily. Continue Toprol  XL 50 mg daily. Cardizem  was stopped. Following the EGD the patient's heparin  was stopped. Was put on lovenox  to transition to warfarin. Continue warfarin per pharmacy. Follow up with Dr Balding as scheduled.    Chronic diastolic CHF Hypertension Denies any symptoms of acute heart failure such as lower extremity edema, shortness of breath, or orthopnea.  Appears euvolemic on exam. Start Lasix  20 mg as needed for lower extremity edema, shortness of breath, orthopnea, or 5 pound weight gain. GDMT.  Blood pressures have continued to remain labile. Continue Toprol  XL 50 mg daily. Restart home Jardiance  10 mg daily Continue losartan  50 mg daily. May consider Starting spirolactone at follow up.   Recurrent pancreatitis: Puzzling why he keeps having new events despite having undergone cholecystectomy and no evidence of residual stones.  He never drinks alcohol.  GI planning to do an outpatient EUS.  Esophagitis: I wonder if the changes on the CT could just be attributable to severe nausea and vomiting. GI did EGD that found normal esophageal mucosa, a hiatal hernia, no masses, and gastritis without bleeding     AKI: Resolved with intravenous fluids.  He appears clinically euvolemic.  Would avoid aggressive IV fluid administration, since he is at risk for HFpEF exacerbation.    S/P TAVR: Both clinical exam and recent echocardiogram on 04/12/24 suggest normal prosthetic valve function.  No plan to reevaluate.  He does require endocarditis prophylaxis for dental procedures.      CAD HLD Cardiac cath on 09/2023 for TAVR workup showed severe calcified stenosis of RCA, stent patent in mid LAD with moderate nonobstructive CAD, patent LCx, calcified aortic valve leaflets, and moderate pulmonary hypertension.  Lipid panel on 04/2024 showed LDL of 35 at goal of less than 55. Continue atorvastatin  20 mg daily. No need for aspirin  with anticoagulation.      [facility] will sign off.   The patient is ready for discharge today from a cardiac standpoint. Medication Recommendations:   Other recommendations (labs, testing, etc):  LFT's, PFT's, and TSH for amiodarone . Follow up as an outpatient:  Has follow up with Dr Francyne. For questions or updates, please contact [facility] Please consult [email or web address] for contact info under       Signed, Morse Clause, PA-C   I have seen and examined the patient along with [name], PA-C .  I have reviewed the chart, notes and new data.  I agree with PA/NP's note.  Key new complaints: Feeling much better.  Still has mild residual abdominal discomfort/tenderness to palpation, but has been able to eat a full meal without nausea or vomiting or worsening abdominal pain. Key examination changes: Normal cardiovascular exam (RRR, very faint aortic ejection murmur, diminished pulses in both legs are chronic findings). Key new findings / data: No recurrence of atrial fibrillation overnight.  PLAN: Gradually reintroducing all the heart medications that he was taking prior to the episode of pancreatitis.   Agree on discontinuation of atorvastatin  and fenofibrate .  He has fenofibrate  for at least 13 years before he developed his first episode of pancreatitis.  Atorvastatin  was started in 2023, but he was on simvastatin  for at least 14 years before that. Continue Repatha  as lipid-lowering therapy.  Reassess lipid profile in several weeks. On enoxaparin  bridge until INR is therapeutic on warfarin.  Has follow-up visit with me in  September.  [facility] will sign off.   Medication Recommendations:   Stop atorvastatin  and fenofibrate  Amiodarone  200 mg twice daily for 2 weeks, through August 16, then 200 mg once daily Jardiance  10 mg daily Losartan  50 mg daily Metoprolol  succinate 50 mg daily (dose reduced after starting amiodarone ) Heparin  bridge until INR 2.0 or higher Furosemide  20 mg as needed for weight gain/swelling Repatha  140 mg every 2 weeks  Other recommendations (labs, testing, etc): Needs follow-up in Coumadin  clinic [facility] this is followed in [place]).  Recheck a lipid profile in roughly 6-8 weeks, before his appointment with me  Follow up as an outpatient: Follow-up appointment scheduled 07/16/2024   Jerel Francyne, MD, FACC [facility] [phone] 05/24/2024, 10:17 AM

## 2024-05-24 NOTE — TOC Transition Note (Signed)
 Transition of Care (TOC) - Discharge Note Rayfield Gobble RN, BSN Inpatient Care Management Unit 4E- RN Case Manager See Treatment Team for direct phone #   Patient Details  Name: Tony Zhang MRN: [id number] Date of Birth: [birth year]  [facility]) CM/SW Contact:  Gobble Rayfield Hurst, RN Phone Number: 05/24/2024, 3:31 PM   Clinical Narrative:    CM spoke with pt regarding HH and DME needs for transition home.   Choice offered for [facility] services- pt politely states he does not want anyone coming to his home and declined HH referral/services at this time.  Pt voiced he has RW and BSC at home- declined any other DME needs.   Pt voiced he has had home 02 in past does not remember name of company- reports that he had them come pick 02 equipment up as they would not allow him to have just a concentrator without having tanks.  Pt states he wants to use another 02 provider this time.   Per chart review pt was active w/ [employer] for home 02- call made to [place], spoke w/ [facility]- per their records pt was set up in April 2025, and pt had 02 picked up in July 2025- no reason in system- pt signed against medical advise form- and concentrator, POC, portable tanks returned to [facility].   New 02 order placed- Pt has voiced he does not have a preference for provider as long as it is not [facility].   Call made to [employer] liaison for home 02 needs- they will provide portable 02 for discharge and contact pt regarding home delivery. CM has explained to pt that every 02 provider will require him to have tanks for emergency back up in the home in case of power outage, etc. Pt voiced understanding of this and is agreeable now that he knows its for emergency needs.   No further CM needs noted. Pt voiced that he has a friend that he can call to provide transport home.    Final next level of care: Home/Self Care Barriers to Discharge: No Barriers Identified   Patient Goals and CMS Choice Patient states their  goals for this hospitalization and ongoing recovery are:: return home CMS Medicare.gov Compare Post Acute Care list provided to:: Patient Choice offered to / list presented to : Patient      Discharge Placement               Home        Discharge Plan and Services Additional resources added to the After Visit Summary for     Discharge Planning Services: CM Consult Post Acute Care Choice: Durable Medical Equipment, Home Health          DME Arranged: Oxygen DME Agency: [facility] Date DME Agency Contacted: 05/24/24 Time DME Agency Contacted: 1530 Representative spoke with at DME Agency: D[facility] HH Arranged: PT, OT, Patient Refused HH HH Agency: NA        Social Drivers of Health (SDOH) Interventions SDOH Screenings   Food Insecurity: No Food Insecurity (05/17/2024)  Housing: Low Risk  (05/17/2024)  Transportation Needs: No Transportation Needs (05/17/2024)  Utilities: Not At Risk (05/17/2024)  Alcohol Screen: Low Risk  (06/04/2022)  Depression (PHQ2-9): Low Risk  (04/24/2024)  Financial Resource Strain: Low Risk  (06/04/2022)  Social Connections: Socially Isolated (05/17/2024)  Tobacco Use: Medium Risk (05/22/2024)     Readmission Risk Interventions    04/16/2024    3:16 PM 11/24/2023   12:05 PM  Readmission Risk  Prevention Plan  Transportation Screening Complete Complete  Home Care Screening  Complete  Medication Review (RN CM)  Complete  Medication Review (RN Care Manager) Complete   PCP or Specialist appointment within 3-5 days of discharge Complete   HRI or Home Care Consult Complete   Palliative Care Screening Not Applicable   Skilled Nursing Facility Not Applicable

## 2024-05-24 NOTE — Progress Notes (Signed)
 SATURATION QUALIFICATIONS: (This note is used to comply with regulatory documentation for home oxygen)  Patient Saturations on Room Air at Rest = 88%  Patient Saturations on Room Air while Ambulating = 84%  Patient Saturations on 2 Liters of oxygen while Ambulating = 88%  Please briefly explain why patient needs home oxygen: Pt hypoxic on RA with exertion. Needed at least 2L O2 [place], to maintain SpO2 >88%, however may need additional test (mobility specialist notified) as noisy signal and difficult to assess if 2L was sufficient as pt fatigued after increase to 2L and returned to room to rest.

## 2024-05-24 NOTE — Progress Notes (Signed)
 Physical Therapy Treatment Patient Details Name: Tony Zhang MRN: [id number] DOB: Dec 14, 1945  Today's Date: 05/24/2024   History of Present Illness Pt  is a 78 y.o. male admitted 7/31 for chest and abdominal pain. Admitted for pancreatitis, complicated by Afib with RVR on 8/1 & 8/3. GI endoscopy 8/5 found Chronic gastritis without bleeding. Recent admission 03/2024 for interstitial pancreatitis. PMH: COPD, CAD (NSTEMI and s/p DES-LAD 05/2022), heart failure, PAD, TAVR, DVT and PE, HTN, HLD, emphysema, pancreatitis    PT Comments  Pt received in supine, SpO2 variable 86-89% on RA at rest and improved to 90% with cues for pursed-lip breathing on RA. Per chart review, MD order is for 92% and above. Pt agreeable to therapy session with emphasis on transfer/AD safety, VS assessment for exertional SpO2 needs, and gait training with RW. Pt needing up to CGA for safety with functional mobility tasks. Pt continues to benefit from PT services to progress toward functional mobility goals, continue to recommend HHPT. Plan to work on stair negotiation next session as pt has 4 STE home and continue to assess supplemental O2 needs with activity.  Patient Saturations on Room Air at Rest = 88%   Patient Saturations on [employer] while Ambulating = 84%   Patient Saturations on 2 Liters of oxygen while Ambulating = 88% (limited distance on 2L recommend additional session to assess if he needs 2 or 4L O2 post-acute, RN/MS notified.     If plan is discharge home, recommend the following: A little help with walking and/or transfers;A little help with bathing/dressing/bathroom;Assistance with cooking/housework;Help with stairs or ramp for entrance;Assist for transportation   Can travel by private vehicle        Equipment Recommendations  None recommended by PT (supplemental O2 needs)    Recommendations for Other Services       Precautions / Restrictions Precautions Precautions: Fall Recall of  Precautions/Restrictions: Impaired Precaution/Restrictions Comments: reluctant to wear O2 once D/C Restrictions Weight Bearing Restrictions Per Provider Order: No     Mobility  Bed Mobility Overal bed mobility: Needs Assistance Bed Mobility: Supine to Sit     Supine to sit: Supervision, Used rails     General bed mobility comments: pt using rail but reports he does not have a rail at home; PTA assist with line mgmt    Transfers Overall transfer level: Needs assistance Equipment used: Rolling walker (2 wheels) Transfers: Sit to/from Stand Sit to Stand: Contact guard assist           General transfer comment: cues for body mechanics and anterior scooting prior to standing, then pt performs with CGA to RW    Ambulation/Gait Ambulation/Gait assistance: Contact guard assist, Supervision Gait Distance (Feet): 85 Feet Assistive device: Rolling walker (2 wheels) Gait Pattern/deviations: Step-through pattern, Decreased step length - right, Decreased step length - left, Trunk flexed Gait velocity: decr     General Gait Details: Assist for safety. Standing rest break halfway through distance with pt propping his elbows on walker. Cues for pursed-lip breathing and posture for improved oxygenation as well as activity pacing; SpO2 noisy signal, see amb sats note above needs at least 2L/min but may need 4L/min O2 for increased exertion, mobility specialist to reassess amb sats later in the day as signal was inconsistent this session.   Stairs Stairs:  (plan for next session)           Wheelchair Mobility     Tilt Bed    Modified Rankin (Stroke Patients Only)  Balance Overall balance assessment: Needs assistance Sitting-balance support: Feet supported, No upper extremity supported Sitting balance-Leahy Scale: Fair     Standing balance support: Bilateral upper extremity supported, Reliant on assistive device for balance Standing balance-Leahy Scale:  Poor Standing balance comment: Reliant on RW                            Communication Communication Communication: No apparent difficulties  Cognition Arousal: Alert Behavior During Therapy: WFL for tasks assessed/performed   PT - Cognitive impairments: No apparent impairments                         Following commands: Intact      Cueing Cueing Techniques: Verbal cues  Exercises Other Exercises Other Exercises: cues for pursed-lip breathing x5 reps x3 sets while mobilizing and prior to OOB due to low SpO2 on RA when PTA arrived    General Comments General comments (skin integrity, edema, etc.): see amb sats above; HR 90's-103 bpm with exertion; BP 123/78 (91) seated in chair with feet down post-exertion, no dizziness with standing tasks      Pertinent Vitals/Pain Pain Assessment Pain Assessment: Faces Faces Pain Scale: Hurts even more Pain Location: back with prolonged standing Pain Descriptors / Indicators: Aching, Grimacing, Guarding Pain Intervention(s): Limited activity within patient's tolerance, Monitored during session, Repositioned    Home Living                          Prior Function            PT Goals (current goals can now be found in the care plan section) Acute Rehab PT Goals Patient Stated Goal: go home after he is stronger PT Goal Formulation: With patient Time For Goal Achievement: 06/06/24 Progress towards PT goals: Progressing toward goals    Frequency    Min 3X/week      PT Plan      Co-evaluation              AM-PAC PT 6 Clicks Mobility   Outcome Measure  Help needed turning from your back to your side while in a flat bed without using bedrails?: A Little Help needed moving from lying on your back to sitting on the side of a flat bed without using bedrails?: A Little Help needed moving to and from a bed to a chair (including a wheelchair)?: A Little Help needed standing up from a chair using  your arms (e.g., wheelchair or bedside chair)?: A Little Help needed to walk in hospital room?: A Little Help needed climbing 3-5 steps with a railing? 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) Nurse Communication: Mobility status;Other (comment);Precautions (SpO2, needs another amb sats test) PT Visit Diagnosis: Unsteadiness on feet (R26.81);Other abnormalities of gait and mobility (R26.89);Muscle weakness (generalized) (M62.81)     Time: 8968-8945 PT Time Calculation (min) (ACUTE ONLY): 23 min  Charges:    $Gait Training: 8-22 mins $Therapeutic Activity: 8-22 mins PT General Charges $$ ACUTE PT VISIT: 1 Visit                     Zen Felling P., PTA Acute Rehabilitation Services Secure Chat Preferred 9a-5:30pm  Office: [phone]    Connell HERO Marni Franzoni 05/24/2024, 11:21 AM

## 2024-05-24 NOTE — Progress Notes (Signed)
 PHARMACY - ANTICOAGULATION CONSULT NOTE  Pharmacy Consult for enoxaparin /warfarin Indication: hx of DVT/PE, afib   Allergies  Allergen Reactions   Codeine Hives    Patient Measurements: Height: 6' 1 (185.4 cm) Weight: 91.4 kg (201 lb 8 oz) IBW/kg (Calculated) : 79.9 HEPARIN  DW (KG): 90.1  Vital Signs: Temp: 97.9 F (36.6 C) (08/07 0810) Temp Source: Oral (08/07 0810) BP: 162/55 (08/07 0810) Pulse Rate: 79 (08/07 0810)  Labs: Recent Labs    05/21/24 1255 05/22/24 0437 05/22/24 0437 05/22/24 1740 05/23/24 0342 05/24/24 0342  HGB  --  14.2   < >  --  13.6 13.4  HCT  --  44.5  --   --  42.7 42.7  PLT  --  425*  --   --  400 433*  LABPROT  --  17.3*  --   --  16.1* 16.7*  INR  --  1.3*  --   --  1.2 1.3*  HEPARINUNFRC  --   --   --  <0.10* 0.10*  --   CREATININE 1.15  --   --   --  1.10 1.42*   < > = values in this interval not displayed.    Estimated Creatinine Clearance: 48.5 mL/min (A) (by C-G formula based on SCr of 1.42 mg/dL (H)).   Assessment: 22 yoM presented to [facility] with recurrent pancreatitis. Patient is on warfarin PTA for Afib (CHADSVASc = 4) and hx recurrent DVT/PE now held for procedure.  Pharmacy consulted to restart warfarin with an enoxaparin  bridge for discharge post-EGD given no evidence of bleeding. Patient also received a 1 mg dose of vitamin K  on 8/4.  PTA warfarin regimen: 4mg  PO sat/sun, 2mg  all other days.   INR is subtherapeutic today at 1.3 following a dose of warfarin 4 mg. Hgb and platelets are stable. No signs of bleeding noted.   Goal of Therapy:  INR 2-3 Monitor platelets by anticoagulation protocol: Yes   Plan:   - Continue enoxaparin  90 mg subcutaneously BID until    clinic appointment (anticipating appointment for 8/8) - Continue warfarin 4 mg daily until clinic appointment  - Monitor INR, CBC, and signs of bleeding daily   B. Amon Rocher, PharmD PGY-1 Pharmacy Resident [facility] 05/24/2024 10:59 AM

## 2024-05-24 NOTE — TOC CM/SW Note (Signed)
 02 documentation per PT:  SATURATION QUALIFICATIONS: (This note is used to comply with regulatory documentation for home oxygen)   Patient Saturations on Room Air at Rest = 88%   Patient Saturations on Room Air while Ambulating = 84%   Patient Saturations on 2 Liters of oxygen while Ambulating = 88%   Please briefly explain why patient needs home oxygen: Pt hypoxic on RA with exertion. Needed at least 2L O2 [place], to maintain SpO2 >88%, however may need additional test (mobility specialist notified) as noisy signal and difficult to assess if 2L was sufficient as pt fatigued after increase to 2L and returned to room to rest.

## 2024-05-24 NOTE — Discharge Summary (Signed)
 Name: Tony Zhang MRN: [id number] DOB: 1945-11-02 78 y.o. PCP: Hunter Mickey Browner, PA-C  Date of Admission: 05/17/2024 10:05 AM Date of Discharge: 05/24/2024 Attending Physician: Dr. Reyes Fenton  Discharge Diagnosis: 1. 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) Active Problems:   Hyperlipidemia   OSA (obstructive sleep apnea)   PAD (peripheral artery disease) (HCC)   AKI (acute kidney injury) (HCC)   Chronic diastolic CHF (congestive heart failure), NYHA class 2 (HCC)   HTN (hypertension)   Leukocytosis   Acute esophagitis   Paroxysmal atrial fibrillation (HCC)   Atrial fibrillation with RVR (HCC)   Acute respiratory failure with hypoxia (HCC)   Abnormal CT scan, esophagus   Gastritis and gastroduodenitis   Longstanding persistent atrial fibrillation (HCC)   Discharge Medications: Allergies as of 05/24/2024       Reactions   Codeine Hives        Medication List     PAUSE taking these medications    cilostazol  100 MG tablet Wait to take this until your doctor or other care provider tells you to start again. Commonly known as: PLETAL  Take 100 mg by mouth daily.       STOP taking these medications    Fenofibric Acid  135 MG Cpdr   HYDROmorphone  2 MG tablet Commonly known as: DILAUDID    spironolactone  25 MG tablet Commonly known as: ALDACTONE        TAKE these medications    acetaminophen  325 MG tablet Commonly known as: TYLENOL  Take 2 tablets (650 mg total) by mouth every 6 (six) hours as needed for mild pain (pain score 1-3) or fever (or Fever >/= 101).   albuterol  108 (90 Base) MCG/ACT inhaler Commonly known as: VENTOLIN  HFA Inhale 2 puffs into the lungs every 4 (four) hours as needed for wheezing or shortness of breath.   allopurinol  300 MG tablet Commonly known as: ZYLOPRIM  Take 300 mg by mouth daily.   amiodarone  200 MG tablet Commonly known as: PACERONE  Take 1 tablet (200 mg total) by mouth 2 (two) times daily for 10  days, THEN 1 tablet (200 mg total) daily for 20 days. Start taking on: May 24, 2024   atorvastatin  20 MG tablet Commonly known as: LIPITOR Take 1 tablet (20 mg total) by mouth daily.   budesonide -formoterol  160-4.5 MCG/ACT inhaler Commonly known as: Symbicort  Inhale 2 puffs into the lungs in the morning and at bedtime.   cyanocobalamin  1000 MCG tablet Commonly known as: VITAMIN B12 Take 1,000 mcg by mouth See admin instructions. Take 1 tablet (1000mcg) by mouth on the 1st of every month.   empagliflozin  10 MG Tabs tablet Commonly known as: JARDIANCE  Take 1 tablet (10 mg total) by mouth daily.   enoxaparin  100 MG/ML injection Commonly known as: LOVENOX  Inject 0.9 mLs (90 mg total) into the skin every 12 (twelve) hours for 5 days. Start tonight   furosemide  20 MG tablet Commonly known as: LASIX  Take 1 tablet (20 mg total) by mouth daily as needed for edema (for 5lb weight gain, shortness of breath, or lower extremity edema).   gabapentin  600 MG tablet Commonly known as: NEURONTIN  Take 600 mg by mouth See admin instructions. Take 1 tablet (600mg ) by mouth twice daily, may increase to three times daily if needed for pain, neuropathy. What changed: when to take this   losartan  50 MG tablet Commonly known as: COZAAR  Take 1 tablet (50 mg total) by mouth daily.   metoprolol  succinate 50 MG 24 hr tablet Commonly  known as: TOPROL -XL Take 1 tablet (50 mg total) by mouth daily. Take with or immediately following a meal. Start taking on: May 25, 2024 What changed:  medication strength how much to take additional instructions   ondansetron  8 MG tablet Commonly known as: ZOFRAN  Take 8 mg by mouth 2 (two) times daily as needed for vomiting or nausea.   pantoprazole  40 MG tablet Commonly known as: PROTONIX  Take 1 tablet (40 mg total) by mouth 2 (two) times daily.   pyridOXINE  50 MG tablet Commonly known as: B-6 Take 1 tablet (50 mg total) by mouth daily.   Repatha   SureClick 140 MG/ML Soaj Generic drug: Evolocumab  INJECT 140 MGS SUBCUTANEOUSLY EVERY 14 DAYS   tamsulosin  0.4 MG Caps capsule Commonly known as: FLOMAX  Take 0.4 mg by mouth daily in the afternoon.   warfarin 4 MG tablet Commonly known as: COUMADIN  Take as directed. If you are unsure how to take this medication, talk to your nurse or doctor. Original instructions: Take 1 tablet (4 mg total) by mouth daily. Start taking on: May 25, 2024 What changed:  medication strength how much to take when to take this additional instructions               Durable Medical Equipment  (From admission, onward)           Start     Ordered   05/24/24 1459  For home use only DME oxygen  Once       Question Answer Comment  Length of Need 12 Months   Mode or (Route) Nasal cannula   Liters per Minute 2   Frequency Continuous (stationary and portable oxygen unit needed)   Oxygen delivery system Gas      05/24/24 1459            Disposition and follow-up:   Tony Zhang was discharged from [facility] in Stable condition.  At the hospital follow up visit please address:   1.Please monitor the patient for any episodes of abdominal pain, nausea, vomiting, or diarrhea.  2.Assess for symptoms such as palpitations, headache, or dizziness.  3.Review medication adherence, including compliance with warfarin and proper bridging with enoxaparin  if indicated.   4. Labs / imaging needed at time of follow-up: CBC, CMP, INR, PT    Follow-up Appointments:  Follow-up Information     [facility] Follow up.   Why: ([employer])- home 02 arranged- they will contact you regarding home delivery- as well as deliver a portable tank for transport home. Contact information: [address] [place] [address] [phone]                  Hospital Course by problem list:  This is a 78 year old male with past medical history of recurrent pancreatitis,  history of CAD status post stent placement, status post cholecystectomy, AAA, DVT, aortic stenosis status post TAVR, A-fib on Coumadin  who presents to the emergency department with concerns of sharp abdominal pain and chest pain, and admitted for pancreatitis with Hospital course complicated by atrial fibrillation with RVR. Below is his hospital course:   Pancreatitis Patient had previous admission with pancreatitis in 03/2024 and prior to that 10/2023. 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 He presented on 05/17/2024 with pain the upper back, chest, abdomen that started about a week prior, with increased nausea and vomiting. Etiologies such as gallstone pancreatitis, hypertriglyceridemia, and alcohol induced pancreatitis ruled out during course of admission. Drug induced pancreatitis  was addressed by making changes to his regimen such as discontinuation of atorvastatin  and his fibrate, since he is on Repatha  at home. GI was consulted and recommended outpatient follow up with EUS for evaluation of microlithiasis and signed off saying that they will arrange follow up with Dr. Abran or his APP at time of discharge.   Esophagitis CT scan on admission revealed esophagitis. EGD done on 8/5 and showed no esophagitis but did show chronic gastritis without bleeding and submucosal poly/nodule x3 in the second portion of the duodenum, which was not an acute issue. GI interpretation of EGD recommended EUS for workup of microlithiasis and also for evaluation of submucosal nodules. GI signed off saying that follow up would be arranged at time of discharge.   Atrial fibrillation with RVR Night of 8/1 patient went into A-fib with RVR with rates into the 160s and was put on amiodarone  drip. Patient was well controlled at low 100s 8/2 but morning 8/3 back in afib RVR up to 180s. Was given metoprolol  injection and amiodarone  bolus. Cardio consulted and believe PAF likely precipitated by pancreatitis.  Patient was feeling stable with no palpitations from 8/4 through day of discharge 8/7. Patient on amiodarone  IV, and transferred to amiodarone  PO on 05/22/2024. Patient started on diltiazem  on 05/21/2024 through 05/23/24. Patient started metoprolol  succinate 24 hr tablet 50 mg daily on 05/23/24.   AKI Patient had elevated creatinine at 2.06 on admission with usual baseline in range of 1.1-12. It was characterized as a 2/2 pancreatitis. Fluid resuscitation during the course of admission resolved AKI, and it downtrended to  1.30 by the time of discharge. Patient was able to make urine without problem through the course of admission and on day of discharge.   Leukocytosis Patient admitted with leukocytosis of 21.5 which downtrended to 16.6 and 11.7 during course of admission after administration of fluids and diet as tolerated. Patient leukocytosis stabilized during course of admission around 11.8 and 12.9. It was 15.4 on the day of discharge, but less concerning in the context of possible hemoconcentration and resolving pancreatitis with consistent symptomatic improvement throughout course of admission. Fluid resuscitation was paused near end of admission due to caution surrounding patient's HFpEF. Patient had no symptoms of newly introduced infection at time of discharge including new dysuria, cough, fever, chills, nausea, or vomiting.   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 EGD negative for sources of bleeding. Patient chronically anticoagulated on Coumadin  and this was continued during course of admission, paused for EGD and bridged with heparin  both before and after EGD.   CAD Patient has history of NSTEMI and is s/p drug-eluting stent-LAD 08/23. Patient did not have symptoms concerning for ACS during course of admission and was continued on atorvastatin  20mg  through course of admission  until 8/5. Pharmacy recommended discontinuation of this medication and also fibrate since patient is already taking home Repatha . Due to pancreatitis risk and pancreatitis reoccurrence, atorvastatin  and fibrate discontinued.   HFpEF Cardio followed patient through the course of admission and signed off on day of discharge 8/7 and noted patient euvolemia and no dyspnea on discharge.   HTN Patients home antihypertensives were initially held at the time of admission due to AKI. Spironolactone  25mg  and losartan  50mg  were started during course of admission for BP control. Patient was normotensive at time of discharge 8/7.   PAD Patient has a longstanding history of occlusion of the  distal abdominal aorta with reconstitution at the level of the common femoral arteries via the inferior epigastric arteries. Home cilostazol  was held during the course of this admission.  Subjective   Discharge Exam:   BP (!) 110/49 (BP Location: Right Arm)   Pulse 74   Temp 98.6 F (37 C) (Oral)   Resp 17   Ht 6' 1 (1.854 m)   Wt 91.4 kg   SpO2 97%   BMI 26.58 kg/m  Const:: Awake, alert in NAD HENT: Normocephalic, atraumatic Card: RRR, No MRG  Resp: LCTAB, No increased work of breathing Abd:  Auscultation does not illicit pain. Patient is able to tolerate palpation of the abdomen, but has some soreness.  Extremities: No pitting edema on LE's bilaterally  Pertinent Labs, Studies, and Procedures:     Latest Ref Rng & Units 05/24/2024    3:42 AM 05/23/2024    3:42 AM 05/22/2024    4:37 AM  CBC  WBC 4.0 - 10.5 K/uL 15.4  11.8  14.3   Hemoglobin 13.0 - 17.0 g/dL 86.5  86.3  85.7   Hematocrit 39.0 - 52.0 % 42.7  42.7  44.5   Platelets 150 - 400 K/uL 433  400  425        Latest Ref Rng & Units 05/24/2024   10:57 AM 05/24/2024    3:42 AM 05/23/2024    3:42 AM  CMP  Glucose 70 - 99 mg/dL 839  883  872   BUN 8 - 23 mg/dL 19  19  15    Creatinine 0.61 - 1.24 mg/dL 8.69  8.57  8.89   Sodium 135 - 145 mmol/L 135   134  134   Potassium 3.5 - 5.1 mmol/L 4.5  5.1  3.2   Chloride 98 - 111 mmol/L 100  98  96   CO2 22 - 32 mmol/L 23  29  28    Calcium  8.9 - 10.3 mg/dL 8.8  8.8  8.5   Total Protein 6.5 - 8.1 g/dL  5.9    Total Bilirubin 0.0 - 1.2 mg/dL  0.6    Alkaline Phos 38 - 126 U/L  58    AST 15 - 41 U/L  38    ALT 0 - 44 U/L  26      CT CHEST ABDOMEN PELVIS WO CONTRAST Result Date: 05/17/2024 CLINICAL DATA:  Chest abdominal pain and hypoxia. History of pancreatitis EXAM: CT CHEST, ABDOMEN AND PELVIS WITHOUT CONTRAST TECHNIQUE: Multidetector CT imaging of the chest, abdomen and pelvis was performed following the standard protocol without IV contrast. RADIATION DOSE REDUCTION: This exam was performed according to the departmental dose-optimization program which includes automated exposure control, adjustment of the mA and/or kV according to patient size and/or use of iterative reconstruction technique. COMPARISON:  Chest CT dated 04/11/2024. FINDINGS: Evaluation of this exam is limited in the absence of intravenous contrast. CT CHEST FINDINGS Cardiovascular: There is no cardiomegaly or pericardial effusion. There is 3 vessel coronary vascular calcification. Aortic valve repair. There is advanced atherosclerotic calcification of the thoracic aorta. Mild dilatation of the aortic arch measuring up to 3.9 cm. The central pulmonary arteries are grossly unremarkable. Mediastinum/Nodes: No hilar or mediastinal adenopathy. There is severe inflammatory changes and thickening of the mid and distal esophagus cough new since the prior CT most consistent with esophagitis. Underlying mass is less likely. There is associated mass effect and narrowing of the lumen of the mid to distal esophagus. No fluid collection or abscess. No extraluminal  air. Lungs/Pleura: Background of emphysema. Small bilateral pleural effusions and bibasilar compressive atelectasis. Pneumonia is not excluded. No pneumothorax. The central airways are patent.  Musculoskeletal: No acute osseous pathology. Degenerative changes of the spine. CT ABDOMEN PELVIS FINDINGS No intra-abdominal free air or free fluid. Hepatobiliary: Small cyst in the right lobe of the liver. No biliary dilatation. Cholecystectomy. Pancreas: Inflammatory changes surrounding the pancreas may be related to acute pancreatitis. Correlation with pancreatic enzymes recommended. No abscess or pseudocyst Spleen: Normal in size without focal abnormality. Adrenals/Urinary Tract: The adrenal glands unremarkable. There is no hydronephrosis or physis on either side. Small bilateral renal cysts. The visualized ureters and urinary bladder appear unremarkable. Stomach/Bowel: Inflammatory changes of the stomach predominantly involving the GE junction and proximal stomach. There is a 15 mm lipoma in the distal small bowel (64/6). There is no bowel obstruction. Appendectomy. Vascular/Lymphatic: Advanced aortoiliac atherosclerotic disease. There is a 3.4 cm infrarenal abdominal aortic aneurysm. The IVC is unremarkable. No portal venous gas. There is no adenopathy. Reproductive: The prostate gland is mildly enlarged measuring 6 cm in transverse axial diameter. The seminal vesicles are symmetric Other: None Musculoskeletal: Osteopenia with degenerative changes. No acute osseous pathology. IMPRESSION: 1. Severe inflammatory changes and thickening of the mid and distal esophagus most consistent with esophagitis. There is associated mass effect and narrowing of the lumen of the mid to distal esophagus. No fluid collection or abscess. No extraluminal air. 2. Inflammatory changes surrounding the pancreas may be related to acute pancreatitis. Correlation with pancreatic enzymes recommended. No abscess or pseudocyst. 3. Small bilateral pleural effusions and bibasilar compressive atelectasis. Pneumonia is not excluded. 4. 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 Recommend annual imaging followup by CTA or MRA. This  recommendation follows 2010 ACCF/AHA/AATS/ACR/ASA/SCA/SCAI/SIR/STS/SVM Guidelines for the Diagnosis and Management of Patients with Thoracic Aortic Disease. Circulation.2010; 121: Z733-z630. Aortic aneurysm NOS (ICD10-I71.9) 5. A 3.4 cm infrarenal abdominal aortic aneurysm. Recommend follow-up ultrasound every 3 years. (Ref.: J Vasc Surg. 2018; 67:2-77 and J Am Coll Radiol 2013;10(10):789-794.) 6. Aortic Atherosclerosis (ICD10-I70.0) and Emphysema (ICD10-J43.9). Electronically Signed   By: Vanetta Chou M.D.   On: 05/17/2024 13:39   DG Chest Port 1 View Result Date: 05/17/2024 CLINICAL DATA:  Chest pain EXAM: PORTABLE CHEST 1 VIEW COMPARISON:  Chest radiograph April 11, 2024 FINDINGS: Bilateral coarse interstitial markings of lung fields increased to prior. No new consolidation. Blunting of left costophrenic angle likely due to scarring. The heart size is normal. Aortic knob is calcified. Tortuous thoracic aorta. No acute osseous abnormality. IMPRESSION: Coarse bibasilar reticular opacities, increased to prior. No new consolidation. Electronically Signed   By: Megan  Zare M.D.   On: 05/17/2024 11:25     Discharge Instructions:  Thank you for allowing us  to be part of your care. You were hospitalized for pancreatitis. We treated you with fluids, pain medication, a gentle diet, and physical therapy. We have improved your breathing by treating your pancreatitis. We have treated your abnormal heart rhythm with heart medications during your stay at the hospital. You had an endoscopy during your hospitalization for chronic esophagitis seen on CT, and the endoscopy was reassuring. You are still recommended to follow up with a GI doctor.   See the changes in your medications and management of your chronic conditions below:  *For your pancreatitis -We have STARTED you on these following medications:  -hydromorphone  2 mg tablet, take it as needed for pain   *For your atrial fibrillation -We have STARTED you  on these  following medications:  -amiodarone  200mg  tablet every 12 hours until August 16th (after August  16th, you only need to take it once daily)             -enoxaparin  90 unit injection every 12 hours for 5 days  -You may CONTINUE the following medications:              - metoprolol  succinate 50 mg daily             -coumadin  (warfarin) 2mg  tablet by mouth every day  For your high cholesterol -You may CONTINUE the following medications:              - Repatha  SureClick 140mg /mL injections once a month  -We have STOPPED the following medications:              - atorvastatin  20 mg tablet 1 tablet by mouth daily             - fenofibric acid  135 mg daily  For your heart -You may CONTINUE the following medications:              -furosemide  20 mg tablet every day             -spironolactone  25 mg tablet every day             -empagliflozin  (Jardiance ) 10 mg tablet every day  For your high blood pressure -You may CONTINUE the following medications:              -losartan  50 mg tablet every day  You may continue the rest of your home medications  FOLLOW UP APPOINTMENTS: We arranged for you to follow up with your primary care doctor Ronaldo Nodal, PA-C in 7-14 days.   Please attend your follow up appointment with your cardiologist. This appointment is scheduled for 07/16/2024 at [address], [place], [place] [address]  Please make sure to follow up with your gastroenterologist who did your endoscopy. Their contact number is [phone]  Please call your PCP if you have any questions or concerns.   We are glad you are feeling better,  Internal Medicine Inpatient Teaching Service at [facility]  Discharge Instructions     Call MD for:  persistant dizziness or light-headedness   Complete by: As directed    Call MD for:  persistant nausea and vomiting   Complete by: As directed    Call MD for:  severe uncontrolled pain   Complete by: As directed    Call MD for:   temperature >100.4   Complete by: As directed    Diet - low sodium heart healthy   Complete by: As directed    Increase activity slowly   Complete by: As directed        Signed: Bernadine Manos, MD 05/24/2024, 4:36 PM

## 2024-05-25 LAB — CBC
HCT: 43.1 % (ref 39.0–52.0)
Hemoglobin: 13.5 g/dL (ref 13.0–17.0)
MCH: 29 pg (ref 26.0–34.0)
MCHC: 31.3 g/dL (ref 30.0–36.0)
MCV: 92.5 fL (ref 80.0–100.0)
Platelets: 383 K/uL (ref 150–400)
RBC: 4.66 MIL/uL (ref 4.22–5.81)
RDW: 16 % — ABNORMAL HIGH (ref 11.5–15.5)
WBC: 10.4 K/uL (ref 4.0–10.5)
nRBC: 0 % (ref 0.0–0.2)

## 2024-05-25 LAB — PROTIME-INR
INR: 1.6 — ABNORMAL HIGH (ref 0.8–1.2)
Prothrombin Time: 19.8 s — ABNORMAL HIGH (ref 11.4–15.2)

## 2024-05-25 NOTE — TOC CM/SW Note (Addendum)
 Noted pt did not discharge 8/7, per bedside RN his home 02 was not delivered for discharge. Msg sent to [employer] liaison to check on situation.  9079- Per [employer] liaison their system showing that item was delivered but they do not have a signed delivery ticket for it- checking further on the issue.  67- [employer] has placed an expedited request for the 02 delivery this am. Should be delivered shortly. It was an error on their end with their POD system. [employer] apologizes for the delay in discharge.

## 2024-05-25 NOTE — Progress Notes (Signed)
 Mobility Specialist Progress Note:   05/25/24 0949  Mobility  Activity Pivoted/transferred from bed to chair  Level of Assistance Minimal assist, patient does 75% or more  Assistive Device Front wheel walker  Distance Ambulated (ft) 3 ft  Activity Response Tolerated well  Mobility Referral Yes  Mobility visit 1 Mobility  Mobility Specialist Start Time (ACUTE ONLY) 0949  Mobility Specialist Stop Time (ACUTE ONLY) 1005  Mobility Specialist Time Calculation (min) (ACUTE ONLY) 16 min   Pt received in bed, declined ambulating at this time, but agreeable to transfer to chair. MinA with RW for safety. Tolerated well, asx throughout. All needs met, eager for d/c.   [place] Mobility Specialist Please contact via SecureChat or  Rehab office at [phone]

## 2024-05-25 NOTE — Plan of Care (Signed)
  Problem: Acute Rehab OT Goals (only OT should resolve) Goal: Pt. Will Perform Grooming Outcome: Adequate for Discharge Goal: Pt. Will Perform Lower Body Dressing Outcome: Adequate for Discharge Goal: Pt. Will Transfer To Toilet Outcome: Adequate for Discharge Goal: Pt. Will Perform Toileting-Clothing Manipulation Outcome: Adequate for Discharge Goal: Pt. Will Perform Tub/Shower Transfer Outcome: Adequate for Discharge   Problem: Acute Rehab PT Goals(only PT should resolve) Goal: Pt Will Go Supine/Side To Sit Outcome: Adequate for Discharge Goal: Pt Will Go Sit To Supine/Side Outcome: Adequate for Discharge Goal: Patient Will Transfer Sit To/From Stand Outcome: Adequate for Discharge Goal: Pt Will Ambulate Outcome: Adequate for Discharge Goal: Pt Will Go Up/Down Stairs Outcome: Adequate for Discharge

## 2024-05-25 NOTE — Progress Notes (Signed)
 Reviewed AVS, patient expressed understanding of medications, MD follow up reviewed.   Removed IV, Site clean, dry and intact.  CCMD contacted and informed patients is being discharged.  Patient states all belongings brought to the hospital at time of admission are accounted for and packed to take home.  Picked up medications from [facility] pharmacy. Aide helping Patient getting dressed.

## 2024-05-25 NOTE — Progress Notes (Signed)
 PHARMACY - ANTICOAGULATION CONSULT NOTE  Pharmacy Consult for enoxaparin /warfarin Indication: hx of DVT/PE, afib   Allergies  Allergen Reactions   Codeine Hives    Patient Measurements: Height: 6' 1 (185.4 cm) Weight: 91.4 kg (201 lb 8 oz) IBW/kg (Calculated) : 79.9 HEPARIN  DW (KG): 90.1  Vital Signs: Temp: 98.1 F (36.7 C) (08/08 0358) Temp Source: Oral (08/08 0358) BP: 100/84 (08/08 0358) Pulse Rate: 72 (08/08 0358)  Labs: Recent Labs    05/22/24 1740 05/23/24 0342 05/23/24 0342 05/24/24 0342 05/24/24 1057 05/25/24 0355  HGB  --  13.6   < > 13.4  --  13.5  HCT  --  42.7  --  42.7  --  43.1  PLT  --  400  --  433*  --  383  LABPROT  --  16.1*  --  16.7*  --  19.8*  INR  --  1.2  --  1.3*  --  1.6*  HEPARINUNFRC <0.10* 0.10*  --   --   --   --   CREATININE  --  1.10  --  1.42* 1.30*  --    < > = values in this interval not displayed.    Estimated Creatinine Clearance: 52.9 mL/min (A) (by C-G formula based on SCr of 1.3 mg/dL (H)).   Assessment: 37 yoM presented to [facility] with recurrent pancreatitis. Patient is on warfarin PTA for Afib (CHADSVASc = 4) and hx recurrent DVT/PE now held for procedure.  Pharmacy consulted to restart warfarin with an enoxaparin  bridge for discharge post-EGD given no evidence of bleeding. Patient also received a 1 mg dose of vitamin K  on 8/4.  PTA warfarin regimen: 4mg  PO sat/sun, 2mg  all other days.   INR is subtherapeutic today at 1.6 following a dose of warfarin 4 mg. Hgb and platelets are stable. No signs of bleeding noted.   Goal of Therapy:  INR 2-3 Monitor platelets by anticoagulation protocol: Yes   Plan:   - Continue enoxaparin  90 mg subcutaneously BID until    clinic appointment (anticipating appointment for 8/11) - Continue warfarin 4 mg daily until clinic appointment  - Monitor INR, CBC, and signs of bleeding daily   B. Amon Rocher, PharmD PGY-1 Pharmacy Resident [facility] 05/25/2024 7:15 AM

## 2024-05-28 ENCOUNTER — Encounter: Payer: Self-pay | Admitting: Physician Assistant

## 2024-07-16 ENCOUNTER — Ambulatory Visit: Attending: Cardiovascular Disease | Admitting: Cardiovascular Disease

## 2024-07-16 VITALS — BP 132/80 | Ht 73.0 in | Wt 203.0 lb

## 2024-07-16 DIAGNOSIS — I2510 Atherosclerotic heart disease of native coronary artery without angina pectoris: Secondary | ICD-10-CM

## 2024-07-16 DIAGNOSIS — Z8719 Personal history of other diseases of the digestive system: Secondary | ICD-10-CM

## 2024-07-16 DIAGNOSIS — I739 Peripheral vascular disease, unspecified: Secondary | ICD-10-CM | POA: Diagnosis not present

## 2024-07-16 DIAGNOSIS — D6869 Other thrombophilia: Secondary | ICD-10-CM | POA: Diagnosis not present

## 2024-07-16 DIAGNOSIS — N183 Chronic kidney disease, stage 3 unspecified: Secondary | ICD-10-CM

## 2024-07-16 DIAGNOSIS — I7409 Other arterial embolism and thrombosis of abdominal aorta: Secondary | ICD-10-CM

## 2024-07-16 DIAGNOSIS — I10 Essential (primary) hypertension: Secondary | ICD-10-CM

## 2024-07-16 DIAGNOSIS — I7143 Infrarenal abdominal aortic aneurysm, without rupture: Secondary | ICD-10-CM

## 2024-07-16 DIAGNOSIS — Z86718 Personal history of other venous thrombosis and embolism: Secondary | ICD-10-CM

## 2024-07-16 DIAGNOSIS — I480 Paroxysmal atrial fibrillation: Secondary | ICD-10-CM

## 2024-07-16 DIAGNOSIS — Z952 Presence of prosthetic heart valve: Secondary | ICD-10-CM

## 2024-07-16 DIAGNOSIS — E7841 Elevated Lipoprotein(a): Secondary | ICD-10-CM

## 2024-07-16 DIAGNOSIS — I7121 Aneurysm of the ascending aorta, without rupture: Secondary | ICD-10-CM

## 2024-07-16 DIAGNOSIS — I5032 Chronic diastolic (congestive) heart failure: Secondary | ICD-10-CM

## 2024-07-16 DIAGNOSIS — N1831 Chronic kidney disease, stage 3a: Secondary | ICD-10-CM

## 2024-07-16 DIAGNOSIS — E785 Hyperlipidemia, unspecified: Secondary | ICD-10-CM

## 2024-07-16 NOTE — Patient Instructions (Signed)
 Medication Instructions:   STOP TAKING : AMIODARONE    *If you need a refill on your cardiac medications before your next appointment, please call your pharmacy*   Lab Work: NONE ORDERED  TODAY\   If you have labs (blood work) drawn today and your tests are completely normal, you will receive your results only by: MyChart Message (if you have MyChart) OR A paper copy in the mail If you have any lab test that is abnormal or we need to change your treatment, we will call you to review the results.  Testing/Procedures: NONE ORDERED  TODAY\   Follow-Up: At [facility], you and your health needs are our priority.  As part of our continuing mission to provide you with exceptional heart care, our providers are all part of one team.  This team includes your primary Cardiologist (physician) and Advanced Practice Providers or APPs (Physician Assistants and Nurse Practitioners) who all work together to provide you with the care you need, when you need it.  Your next appointment:    6 month(s)  Provider:   Jerel Balding, MD    We recommend signing up for the patient portal called MyChart.  Sign up information is provided on this After Visit Summary.  MyChart is used to connect with patients for Virtual Visits (Telemedicine).  Patients are able to view lab/test results, encounter notes, upcoming appointments, etc.  Non-urgent messages can be sent to your provider as well.   To learn more about what you can do with MyChart, go to [email or web address].

## 2024-07-16 NOTE — Progress Notes (Signed)
 Cardiology Office Note:    Date:  07/16/2024   ID:  Tony Zhang, DOB 1946-07-26, MRN [id number]  PCP:  Hunter Mickey Browner, PA-C   [facility] Providers Cardiologist:  Jerel Balding, MD     Referring MD: Hunter Mickey Browner, PA-C (retired, now sees Dr Levander in [place]).  No chief complaint on file.   History of Present Illness:    Tony Zhang is a 78 y.o. 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    He has largely recovered from his multiple hospitalizations this year, but continues to feel that his legs are not as strong as they were before and he does not have the same stamina.  He has gone back to working at the [employer].  For a while he had to rock himself back-and-forth just to get out of a chair, but now he can push himself out of a chair using strength in his legs.  He also has problems with back pain and has had remote back surgery.  He has not had any problems with chest pain.  He denies palpitations currently, but did have palpitations when he had atrial fibrillation during his hospital stay.  He still has some tenderness in the left subclavian area since the TAVR procedure.  After undergoing TAVR from a left subclavian approach he had a very protracted recovery since he developed gallstone pancreatitis.  This led to a long hospitalization.  He eventually was discharged and  returned on April 1 for elective laparoscopic cholecystectomy, but then was hospitalized again in late April with pneumonia and with 2 more episodes of acute pancreatitis 04/11/2024 and 05/17/2024.  EGD showed changes consistent with gastritis and MRCP confirmed peripancreatic inflammatory changes but no evidence of bile duct dilation or definite common bile duct stones.  His most recent echocardiogram 04/12/2024 shows normal left ventricular systolic function with EF 60 to 65% and no signs of increased left heart filling pressures.  The right ventricle is moderately enlarged with mildly reduced systolic function and the left atrium is mildly dilated.  The TAVR prosthesis was functioning normally with a mean gradient of 5 mmHg.  Previous studies has shown mild pulmonary hypertension, but this was not calculated on the last echocardiogram.    He is currently taking amiodarone  200 mg once daily.  He does not think he has had any atrial fibrillation in the last 2 or 3 months.  He is chronically anticoagulated anyway for recurrent venous thromboembolic events.  He has not had any serious falls or bleeding events.  He did have 1 fall where he staggered backwards and hit his back against the couch, but he did not have any head impact.  Metabolic parameters look pretty good.  His most recent creatinine was 1.30, hemoglobin 13.5, normal TSH and liver function tests.  He has a very low LDL cholesterol at 35, but also chronically low HDL which recently was only 19.  He is on chronic treatment with Repatha  due to history of statin myopathy.  His blood  pressure has been in normal range.  He is currently taking only losartan  50 mg daily and metoprolol  succinate 50 mg daily.  He takes furosemide  as needed and has not required it in a while.   Past Medical History:  Diagnosis Date   AAA (abdominal aortic aneurysm)    Blood dyscrasia    followed by Dr. Cloretta, for clotting issue, has been on Coumadin  for about 20  yrs.     CAD (coronary artery disease)    Chronic kidney disease    COPD (chronic obstructive pulmonary disease) (HCC)    Diabetes mellitus without complication (HCC)    DJD (degenerative joint disease)    Dyslipidemia    Dyspnea    Gout    Malignant hypertension    Peripheral vascular disease    Pneumonia 08/18/2013   pt. 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 Wonda and Dr. Lucas   Severe aortic stenosis    Venous thrombosis    Recurrent    Past Surgical History:  Procedure Laterality Date   APPENDECTOMY     BACK SURGERY     CHOLECYSTECTOMY N/A 01/17/2024   Procedure: LAPAROSCOPIC CHOLECYSTECTOMY WITH ICG;  Surgeon: Ebbie Cough, MD;  Location: [facility] OR;  Service: General;  Laterality: N/A;   COLONOSCOPY N/A 04/13/2015   Procedure: COLONOSCOPY;  Surgeon: Lynwood Bohr, MD;  Location: [facility] ENDOSCOPY;  Service: Endoscopy;  Laterality: N/A;   CORONARY STENT INTERVENTION N/A 06/04/2022   Procedure: CORONARY STENT INTERVENTION;  Surgeon: Dann Candyce RAMAN, MD;  Location: [facility] INVASIVE CV LAB;  Service: Cardiovascular;  Laterality: N/A;   EMPYEMA DRAINAGE N/A 11/25/2014   Procedure: EMPYEMA DRAINAGE;  Surgeon: Dallas KATHEE Jude, MD;  Location: [facility] OR;  Service: Thoracic;  Laterality: N/A;   ESOPHAGOGASTRODUODENOSCOPY N/A 04/10/2015   Procedure: ESOPHAGOGASTRODUODENOSCOPY (EGD);  Surgeon: Jerrell Sol, MD;  Location: [facility] ENDOSCOPY;  Service: Endoscopy;  Laterality: N/A;   ESOPHAGOGASTRODUODENOSCOPY N/A 05/22/2024   Procedure: EGD (ESOPHAGOGASTRODUODENOSCOPY);  Surgeon: Albertus Gordy HERO, MD;  Location: [facility] ENDOSCOPY;  Service: Gastroenterology;  Laterality: N/A;   EYE SURGERY     both eyes, cataracts removed, denies lens implants   FLEXIBLE SIGMOIDOSCOPY N/A 04/12/2015   Procedure:  FLEXIBLE SIGMOIDOSCOPY;  Surgeon: Lynwood Bohr, MD;  Location: [facility] ENDOSCOPY;  Service: Endoscopy;  Laterality: N/A;   HERNIA REPAIR     LEFT HEART CATH AND CORONARY ANGIOGRAPHY N/A 06/04/2022   Procedure: LEFT HEART CATH AND CORONARY ANGIOGRAPHY;  Surgeon: Dann Candyce RAMAN, MD;  Location: [facility] INVASIVE CV LAB;  Service: Cardiovascular;  Laterality: N/A;   RIGHT HEART CATH AND CORONARY ANGIOGRAPHY N/A 09/28/2023   Procedure: RIGHT HEART CATH AND CORONARY ANGIOGRAPHY;  Surgeon: Wonda Sharper, MD;  Location: [facility] INVASIVE CV LAB;  Service: Cardiovascular;  Laterality: N/A;   SHOULDER SURGERY Right    TRANSCATHETER AORTIC VALVE REPLACEMENT,SUBCLAVIAN Left 11/08/2023   Procedure: Transcatheter Aortic Valve Replacement-Subclavian;  Surgeon: Wonda Sharper, MD;  Location: [facility] INVASIVE CV LAB;  Service: Cardiovascular;  Laterality: Left;   TRANSESOPHAGEAL ECHOCARDIOGRAM (CATH LAB) N/A 11/08/2023   Procedure: TRANSESOPHAGEAL ECHOCARDIOGRAM;  Surgeon: Wonda Sharper, MD;  Location: [facility] INVASIVE CV LAB;  Service: Cardiovascular;  Laterality: N/A;   VIDEO ASSISTED THORACOSCOPY Right 11/25/2014   Procedure: VIDEO ASSISTED THORACOSCOPY;  Surgeon: Dallas KATHEE Jude, MD;  Location: [facility] OR;  Service:  Thoracic;  Laterality: Right;   VIDEO BRONCHOSCOPY N/A 11/25/2014   Procedure: VIDEO BRONCHOSCOPY;  Surgeon: Dallas KATHEE Jude, MD;  Location: [facility] OR;  Service: Thoracic;  Laterality: N/A;    Current Medications: Current Meds  Medication Sig   acetaminophen  (TYLENOL ) 325 MG tablet Take 2 tablets (650 mg total) by mouth every 6 (six) hours as needed for mild pain (pain score 1-3) or fever (or Fever >/= 101).   albuterol  (VENTOLIN  HFA) 108 (90 Base) MCG/ACT inhaler Inhale 2 puffs into the lungs every 4 (four) hours as needed for wheezing or shortness of breath.   allopurinol  (ZYLOPRIM ) 300 MG tablet Take 300 mg by mouth daily.   budesonide -formoterol  (SYMBICORT ) 160-4.5 MCG/ACT inhaler Inhale 2 puffs into the lungs in the  morning and at bedtime.   [Paused] cilostazol  (PLETAL ) 100 MG tablet Take 100 mg by mouth daily.   cyanocobalamin  (VITAMIN B12) 1000 MCG tablet Take 1,000 mcg by mouth See admin instructions. Take 1 tablet (1000mcg) by mouth on the 1st of every month.   empagliflozin  (JARDIANCE ) 10 MG TABS tablet Take 1 tablet (10 mg total) by mouth daily.   enoxaparin  (LOVENOX ) 100 MG/ML injection Inject 0.9 mLs (90 mg total) into the skin every 12 (twelve) hours for 5 days. Start tonight   furosemide  (LASIX ) 20 MG tablet Take 1 tablet (20 mg total) by mouth daily as needed for edema (for 5lb weight gain, shortness of breath, or lower extremity edema).   gabapentin  (NEURONTIN ) 600 MG tablet Take 600 mg by mouth See admin instructions. Take 1 tablet (600mg ) by mouth twice daily, may increase to three times daily if needed for pain, neuropathy. (Patient taking differently: Take 600 mg by mouth 2 (two) times daily. Take 1 tablet (600mg ) by mouth twice daily, may increase to three times daily if needed for pain, neuropathy.)   losartan  (COZAAR ) 50 MG tablet Take 1 tablet (50 mg total) by mouth daily.   metoprolol  succinate (TOPROL -XL) 50 MG 24 hr tablet Take 1 tablet (50 mg total) by mouth daily. Take with or immediately following a meal.   ondansetron  (ZOFRAN ) 8 MG tablet Take 8 mg by mouth 2 (two) times daily as needed for vomiting or nausea.   pantoprazole  (PROTONIX ) 40 MG tablet Take 1 tablet (40 mg total) by mouth 2 (two) times daily.   pyridOXINE  (B-6) 50 MG tablet Take 1 tablet (50 mg total) by mouth daily.   REPATHA  SURECLICK 140 MG/ML SOAJ INJECT 140 MGS SUBCUTANEOUSLY EVERY 14 DAYS   tamsulosin  (FLOMAX ) 0.4 MG CAPS capsule Take 0.4 mg by mouth daily in the afternoon.   warfarin (COUMADIN ) 4 MG tablet Take 1 tablet (4 mg total) by mouth daily.   [DISCONTINUED] amiodarone  (PACERONE ) 200 MG tablet Take 1 tablet (200 mg total) by mouth 2 (two) times daily for 10 days, THEN 1 tablet (200 mg total) daily for 20 days.      Allergies:   Codeine     Family History: The patient's family history includes CAD in his sister; CAD (age of onset: 40) in his father; Liver cancer in his brother.  ROS:   Please see the history of present illness.     All other systems reviewed and are negative.  EKGs/Labs/Other Studies Reviewed:    The following studies were reviewed today:  CT angiogram of the aorta  1. No evidence of acute aortic syndrome. 2. 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 3. Unchanged fusiform distal  aortic arch aneurysm measuring up to 3.9 cm. Recommend annual imaging followup by CTA or MRA. This recommendation follows 2010 ACCF/AHA/AATS/ACR/ASA/SCA/SCAI/SIR/STS/SVM Guidelines for the Diagnosis and Management of Patients with Thoracic Aortic Disease. Circulation.2010; 121: Z733-z630. Aortic aneurysm NOS (ICD10-I71.9) 4.  Coronary and aortic Atherosclerosis (ICD10-I70.0).   Echocardiogram 12/07/2023   1. Left ventricular ejection fraction, by estimation, is 60 to 65%. The  left ventricle has normal function. The left ventricle has no regional  wall motion abnormalities. There is moderate left ventricular hypertrophy.  Left ventricular diastolic  parameters are consistent with Grade I diastolic dysfunction (impaired  relaxation).   2. Right ventricular systolic function is normal. The right ventricular  size is mildly enlarged. There is mildly elevated pulmonary artery  systolic pressure. The estimated right ventricular systolic pressure is  37.1 mmHg.   3. The mitral valve is degenerative. Trivial mitral valve regurgitation.  No evidence of mitral stenosis. Moderate mitral annular calcification.   4. The aortic valve has been repaired/replaced. Aortic valve  regurgitation is not visualized. No aortic stenosis is present. There is a  26 mm Sapien prosthetic (TAVR) valve present in the aortic position.   Procedure Date: 11/08/2023. Echo findings are  consistent with normal structure and function of the aortic valve  prosthesis. Aortic valve area, by VTI measures 2.59 cm. Aortic valve mean  gradient measures 9.1 mmHg. Aortic valve Vmax measures 1.99 m/s. Aortic  valve acceleration time measures 53 msec.   5. The inferior vena cava is normal in size with greater than 50%  respiratory variability, suggesting right atrial pressure of 3 mmHg.    Personally reviewed ECG from 01/19/2024 which is similar to previous tracing it shows normal sinus rhythm, incomplete right bundle branch block, left anterior fascicular block, QS pattern in the septal leads, normal QTc interval. EKG:    EKG Interpretation Date/Time:    Ventricular Rate:    PR Interval:    QRS Duration:    QT Interval:    QTC Calculation:   R Axis:      Text Interpretation:          Cardiac catheterization 09/28/2023    Ost LAD to Mid LAD lesion is 50% stenosed.   Ost RCA to Prox RCA lesion is 80% stenosed.   Prox RCA lesion is 25% stenosed.   Mid RCA lesion is 70% stenosed.   1st Diag lesion is 50% stenosed.   3rd Mrg lesion is 75% stenosed.   Dist RCA lesion is 50% stenosed.   Non-stenotic Mid LAD lesion was previously treated.   Hemodynamic findings consistent with moderate pulmonary hypertension.   1.  Severe calcific stenosis of the RCA, unchanged from the previous cardiac catheterization procedure 2.  Continued patency of the stented segment in the mid LAD with moderate nonobstructive plaquing in the proximal vessel 3.  Patent left circumflex with no high-grade stenosis 4.  Patent left subclavian artery with mild calcified stenosis 5.  Calcified aortic valve leaflets with known severe aortic stenosis by noninvasive assessment 6.  Moderate pulmonary hypertension with mean PA pressure 39 mmHg, transpulmonary gradient 20 mmHg, PVR 4.26 Wood units   Recommendations: Continue TAVR evaluation.  Medical therapy for  CAD.  Hydration with repeat labs in the morning, overnight observation.   Recent Labs: 02/13/2024: B Natriuretic Peptide 277.5 05/23/2024: Magnesium  1.8 05/24/2024: ALT 26; BUN 19; Creatinine, Ser 1.30; Potassium 4.5; Sodium 135 05/25/2024: Hemoglobin 13.5; Platelets 383  Recent Lipid Panel    Component Value Date/Time   CHOL 74  05/17/2024 1927   CHOL 98 (L) 09/22/2023 0947   TRIG 101 05/17/2024 1927   HDL 19 (L) 05/17/2024 1927   HDL 39 (L) 09/22/2023 0947   CHOLHDL 3.9 05/17/2024 1927   VLDL 20 05/17/2024 1927   LDLCALC 35 05/17/2024 1927   LDLCALC 31 09/22/2023 0947     Risk Assessment/Calculations:                Physical Exam:    VS:  BP 132/80   Ht 6' 1 (1.854 m)   Wt 203 lb (92.1 kg)   SpO2 90%   BMI 26.78 kg/m     Wt Readings from Last 3 Encounters:  07/16/24 203 lb (92.1 kg)  05/24/24 201 lb 8 oz (91.4 kg)  05/02/24 205 lb 3.2 oz (93.1 kg)      General: Alert, oriented x3, no distress, minimally overweight Head: no evidence of trauma, PERRL, EOMI, no exophtalmos or lid lag, no myxedema, no xanthelasma; normal ears, nose and oropharynx Neck: normal jugular venous pulsations and no hepatojugular reflux; brisk carotid pulses without delay and no carotid bruits Chest: clear to auscultation, no signs of consolidation by percussion or palpation, normal fremitus, symmetrical and full respiratory excursions Cardiovascular: normal position and quality of the apical impulse, regular rhythm, normal first and second heart sounds, 1/6 early peaking aortic ejection murmur no diastolic murmurs, rubs or gallops Abdomen: no tenderness or distention, no masses by palpation, no abnormal pulsatility or arterial bruits, normal bowel sounds, no hepatosplenomegaly Extremities: no clubbing, cyanosis or edema; 2+ radial, ulnar and brachial pulses bilaterally; 2+ right femoral, posterior tibial and dorsalis pedis pulses; 2+ left femoral, posterior tibial and dorsalis pedis pulses; no  subclavian or femoral bruits Neurological: grossly nonfocal Psych: Normal mood and affect     ASSESSMENT:    1. S/P TAVR (transcatheter aortic valve replacement)   2. Paroxysmal atrial fibrillation (HCC)   3. Hypercoagulable state due to paroxysmal atrial fibrillation (HCC)   4. Coronary artery disease involving native coronary artery of native heart without angina pectoris   5. PAD (peripheral artery disease)   6. Chronic distal aortic occlusion (HCC)   7. Aneurysm of ascending aorta without rupture   8. Infrarenal abdominal aortic aneurysm (AAA) without rupture   9. Stage 3 chronic kidney disease, unspecified whether stage 3a or 3b CKD (HCC)   10. Chronic diastolic CHF (congestive heart failure), NYHA class 2 (HCC)   11. Essential hypertension   12. Dyslipidemia (high LDL; low HDL)   13. Elevated lipoprotein(a)   14. History of DVT (deep vein thrombosis)   15. Stage 3a chronic kidney disease (HCC)   16. History of acute pancreatitis        PLAN:    In order of problems listed above:  S/P TAVR: Normally functioning prosthetic valve.  Most recent echo was in June 2025.  Aware of the need for endocarditis prophylaxis. AFib: This flared up during his acute illnesses this year, but he has not had any symptoms of atrial fibrillation in a few months.  Will stop the amiodarone .  He is on chronic anticoagulation regardless. CAD: He does not have angina therapy, with a relatively low dose with metoprolol  as his only antianginal.  He has a high-grade stenosis in the proximal right coronary artery that is difficult for percutaneous revascularization.  No longer on antiplatelet agents since he is fully anticoagulated with warfarin.  Had a drug-eluting stent placed for ACS in the LAD artery in late August 2023 which  was widely patent by the most recent cath in December 2024 PAD/chronic occlusion of the infrarenal abdominal aorta: He has bilateral leg pain radiating from his back at rest and  with activity that sounds more neurogenic than vascular claudication.  Longstanding history of occlusion of the distal abdominal aorta with reconstitution at the level of the common femoral arteries via inferior epigastric arteries.  Based on previous imaging studies does not have a whole lot of disease in the infrapopliteal distribution.   Aortic arch aneurysm/suprarenal AAA: Most recently formally evaluated just before his TAVR (ascending aorta 4.0 cm), but both have been repeatedly evaluated with CTs/MRIs of the abdomen performed during his recurrent episodes of pancreatitis.  His ascending aortic aneurysm has minimally changed as most recently measured at 4.0 cm on CT before TAVR 11/15/2023 and a suprarenal abdominal aortic aneurysm 3.5 cm.  On CT 05/17/2024 the ascending aorta at the level of the arch was measured at 3.9 cm and the infrarenal AAA was 3.4 cm. CHF: Clinically euvolemic, he complains of fatigue rather than dyspnea, overall NYHA functional class II.  He is currently on losartan  50 mg daily, Jardiance  and metoprolol  succinate as well as a very low-dose of as needed furosemide .  Elected not to start spironolactone  at this time since he has normal LVEF and well-controlled blood pressure. HTN: Well-controlled on current medications.  He HLP: Excellent response to Repatha .  Note that he has a severely elevated LP(a).  May be a candidate for inclisiran.   History of DVT/PE: INR monitored in [place].  Warned him that he may see his warfarin requirements increase gradually as the amiodarone  wears off. CKD3A: AKI during the episode of pancreatitis.  Most recent renal parameters are actually better than his previous estimated baseline with the most recent creatinine of 1.3 a month ago, GFR in the 50s. 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  Improved neuropathy in his feet after starting gabapentin . History of gout: Avoid thiazide diuretics. History of gallstone pancreatitis in February 2025/June  2025/July 2025, even though he underwent laparoscopic cholecystectomy April 2025            Medication Adjustments/Labs and Tests Ordered: Current medicines are reviewed at length with the patient today.  Concerns regarding medicines are outlined above.  No orders of the defined types were placed in this encounter.  No orders of the defined types were placed in this encounter.   Patient Instructions  Medication Instructions:   STOP TAKING : AMIODARONE    *If you need a refill on your cardiac medications before your next appointment, please call your pharmacy*   Lab Work: NONE ORDERED  TODAY\   If you have labs (blood work) drawn today and your tests are completely normal, you will receive your results only by: MyChart Message (if you have MyChart) OR A paper copy in the mail If you have any lab test that is abnormal or we need to change your treatment, we will call you to review the results.  Testing/Procedures: NONE ORDERED  TODAY\   Follow-Up: At [facility], you and your health needs are our priority.  As part of our continuing mission to provide you with exceptional heart care, our providers are all part of one team.  This team includes your primary Cardiologist (physician) and Advanced Practice Providers or APPs (Physician Assistants and Nurse Practitioners) who all work together to provide you with the care you need, when you need it.  Your next appointment:    6 month(s)  Provider:  Jerel Balding, MD    We recommend signing up for the patient portal called MyChart.  Sign up information is provided on this After Visit Summary.  MyChart is used to connect with patients for Virtual Visits (Telemedicine).  Patients are able to view lab/test results, encounter notes, upcoming appointments, etc.  Non-urgent messages can be sent to your provider as well.   To learn more about what you can do with MyChart, go to [email or web address].                Signed, Jerel Balding, MD  07/16/2024 4:05 PM    [facility]

## 2024-07-23 ENCOUNTER — Ambulatory Visit: Admitting: Physician Assistant

## 2024-07-23 NOTE — Progress Notes (Deleted)
 Ellouise Console, PA-C [address] [place], [place]  [address] Phone: [phone]   Gastroenterology Consultation  Referring Provider:     Hunter Mickey Browner, PA-C Primary Care Physician:  Hunter Mickey Browner, PA-C Primary Gastroenterologist:  Ellouise Console, PA-C / Norleen Kiang, MD  Reason for Consultation:     Hospital follow-up pancreatitis        HPI:   Tony Zhang is a 78 y.o. y/o male referred for consultation & management  by Nodal, Mickey Browner, PA-C.  Established patient Dr. Kiang.  Was hospitalized 05/17/2024 until 05/24/2024 for acute on chronic pancreatitis.  Underwent cholecystectomy 01/2024.  During hospitalization etiologies such as gallstone pancreatitis, hypertriglyceridemia, and alcohol induced pancreatitis were ruled out.  Drug-induced pancreatitis was addressed by adjusting medication.  Outpatient EUS was recommended to evaluate for microlithiasis if he continues to have recurrent pancreatitis.  11/2023: Lipase 74 03/2024: Lipase 293 04/2024: Lipase 161 06/05/2024 labs showed improved lipase to 89.  Hemoglobin 13.5.  Normal LFTs.  05/19/2024 abdominal MRI/MRCP: 1. Peripancreatic inflammatory changes consistent with pancreatitis but no findings suspicious for pancreatic necrosis. Normal caliber and course of the main pancreatic duct. No findings suspicious for pancreatic divisum. 2. Status post cholecystectomy. No common bile duct dilatation or definite common bile duct stones. 3. 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 4. Small bilateral pleural effusions and bibasilar atelectasis.  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  11/2023 LVEF 60 to 65%.  On chronic anticoagulation with Coumadin .  He has had multiple hospitalizations this year.  05/22/2024 EGD by Dr. Albertus in hospital: 1 cm hiatal hernia.  Normal esophagus.  Moderate chronic gastritis without bleeding or ulceration.  3 (7 mm to 15 mm) submucosal polyp/nodules in the second portion of duodenum.  Needs outpatient EUS for evaluation of submucosal nodules and workup for microlithiasis.  03/2015 last colonoscopy by Dr. Celestia at [facility]: Grossly normal.  No polyps or masses.  Small internal hemorrhoids.  Past Medical History:  Diagnosis Date   AAA (abdominal aortic aneurysm)    Blood dyscrasia    followed by Dr. Cloretta, for clotting issue, has been on Coumadin  for about 20 yrs.     CAD (coronary artery disease)    Chronic kidney disease    COPD (chronic obstructive pulmonary disease) (HCC)    Diabetes mellitus without complication (HCC)    DJD (degenerative joint disease)    Dyslipidemia    Dyspnea    Gout    Malignant hypertension    Peripheral vascular disease    Pneumonia 08/18/2013   pt. 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 Wonda and Dr. Lucas   Severe aortic stenosis    Venous thrombosis    Recurrent    Past Surgical History:  Procedure Laterality Date   APPENDECTOMY     BACK  SURGERY     CHOLECYSTECTOMY N/A 01/17/2024   Procedure: LAPAROSCOPIC CHOLECYSTECTOMY WITH ICG;  Surgeon: Ebbie Cough, MD;  Location: [facility] OR;  Service: General;  Laterality: N/A;    COLONOSCOPY N/A 04/13/2015   Procedure: COLONOSCOPY;  Surgeon: Lynwood Bohr, MD;  Location: [facility] ENDOSCOPY;  Service: Endoscopy;  Laterality: N/A;   CORONARY STENT INTERVENTION N/A 06/04/2022   Procedure: CORONARY STENT INTERVENTION;  Surgeon: Dann Candyce RAMAN, MD;  Location: [facility] INVASIVE CV LAB;  Service: Cardiovascular;  Laterality: N/A;   EMPYEMA DRAINAGE N/A 11/25/2014   Procedure: EMPYEMA DRAINAGE;  Surgeon: Dallas KATHEE Jude, MD;  Location: [facility] OR;  Service: Thoracic;  Laterality: N/A;   ESOPHAGOGASTRODUODENOSCOPY N/A 04/10/2015   Procedure: ESOPHAGOGASTRODUODENOSCOPY (EGD);  Surgeon: Jerrell Sol, MD;  Location: [facility] ENDOSCOPY;  Service: Endoscopy;  Laterality: N/A;   ESOPHAGOGASTRODUODENOSCOPY N/A 05/22/2024   Procedure: EGD (ESOPHAGOGASTRODUODENOSCOPY);  Surgeon: Albertus Gordy HERO, MD;  Location: [facility] ENDOSCOPY;  Service: Gastroenterology;  Laterality: N/A;   EYE SURGERY     both eyes, cataracts removed, denies lens implants   FLEXIBLE SIGMOIDOSCOPY N/A 04/12/2015   Procedure: FLEXIBLE SIGMOIDOSCOPY;  Surgeon: Lynwood Bohr, MD;  Location: [facility] ENDOSCOPY;  Service: Endoscopy;  Laterality: N/A;   HERNIA REPAIR     LEFT HEART CATH AND CORONARY ANGIOGRAPHY N/A 06/04/2022   Procedure: LEFT HEART CATH AND CORONARY ANGIOGRAPHY;  Surgeon: Dann Candyce RAMAN, MD;  Location: [facility] INVASIVE CV LAB;  Service: Cardiovascular;  Laterality: N/A;   RIGHT HEART CATH AND CORONARY ANGIOGRAPHY N/A 09/28/2023   Procedure: RIGHT HEART CATH AND CORONARY ANGIOGRAPHY;  Surgeon: Wonda Sharper, MD;  Location: [facility] INVASIVE CV LAB;  Service: Cardiovascular;  Laterality: N/A;   SHOULDER SURGERY Right    TRANSCATHETER AORTIC VALVE REPLACEMENT,SUBCLAVIAN Left 11/08/2023   Procedure: Transcatheter Aortic Valve Replacement-Subclavian;  Surgeon: Wonda Sharper, MD;  Location: [facility] INVASIVE CV LAB;  Service: Cardiovascular;  Laterality: Left;   TRANSESOPHAGEAL ECHOCARDIOGRAM (CATH LAB) N/A 11/08/2023   Procedure: TRANSESOPHAGEAL  ECHOCARDIOGRAM;  Surgeon: Wonda Sharper, MD;  Location: [facility] INVASIVE CV LAB;  Service: Cardiovascular;  Laterality: N/A;   VIDEO ASSISTED THORACOSCOPY Right 11/25/2014   Procedure: VIDEO ASSISTED THORACOSCOPY;  Surgeon: Dallas KATHEE Jude, MD;  Location: [facility] OR;  Service: Thoracic;  Laterality: Right;   VIDEO BRONCHOSCOPY N/A 11/25/2014   Procedure: VIDEO BRONCHOSCOPY;  Surgeon: Dallas KATHEE Jude, MD;  Location: [facility] OR;  Service: Thoracic;  Laterality: N/A;    Prior to Admission medications   Medication Sig Start Date End Date Taking? Authorizing Provider  acetaminophen  (TYLENOL ) 325 MG tablet Take 2 tablets (650 mg total) by mouth every 6 (six) hours as needed for mild pain (pain score 1-3) or fever (or Fever >/= 101). 05/24/24   Bernadine Manos, MD  albuterol  (VENTOLIN  HFA) 108 (90 Base) MCG/ACT inhaler Inhale 2 puffs into the lungs every 4 (four) hours as needed for wheezing or shortness of breath.    [provider]  allopurinol  (ZYLOPRIM ) 300 MG tablet Take 300 mg by mouth daily.    [provider]  budesonide -formoterol  (SYMBICORT ) 160-4.5 MCG/ACT inhaler Inhale 2 puffs into the lungs in the morning and at bedtime. 02/28/23   Gonfa, Taye T, MD  cilostazol  (PLETAL ) 100 MG tablet Take 100 mg by mouth daily.    [provider]  cyanocobalamin  (VITAMIN B12) 1000 MCG tablet Take 1,000 mcg by mouth See admin instructions. Take 1 tablet (1000mcg) by mouth on the 1st of every month.    [provider]  empagliflozin  (JARDIANCE ) 10 MG TABS tablet Take  1 tablet (10 mg total) by mouth daily. 08/25/23   Wonda Sharper, MD  enoxaparin  (LOVENOX ) 100 MG/ML injection Inject 0.9 mLs (90 mg total) into the skin every 12 (twelve) hours for 5 days. Start tonight 05/24/24 07/16/24  Azadegan, Maryam, MD  furosemide  (LASIX ) 20 MG tablet Take 1 tablet (20 mg total) by mouth daily as needed for edema (for 5lb weight gain, shortness of breath, or lower extremity edema). 05/24/24   Azadegan,  Maryam, MD  gabapentin  (NEURONTIN ) 600 MG tablet Take 600 mg by mouth See admin instructions. Take 1 tablet (600mg ) by mouth twice daily, may increase to three times daily if needed for pain, neuropathy. Patient taking differently: Take 600 mg by mouth 2 (two) times daily. Take 1 tablet (600mg ) by mouth twice daily, may increase to three times daily if needed for pain, neuropathy. 12/02/23 12/01/24  [provider]  losartan  (COZAAR ) 50 MG tablet Take 1 tablet (50 mg total) by mouth daily. 02/08/24 07/16/24  Croitoru, Mihai, MD  metoprolol  succinate (TOPROL -XL) 50 MG 24 hr tablet Take 1 tablet (50 mg total) by mouth daily. Take with or immediately following a meal. 05/25/24   Azadegan, Maryam, MD  ondansetron  (ZOFRAN ) 8 MG tablet Take 8 mg by mouth 2 (two) times daily as needed for vomiting or nausea. 05/25/22   [provider]  pantoprazole  (PROTONIX ) 40 MG tablet Take 1 tablet (40 mg total) by mouth 2 (two) times daily. 05/24/24   Azadegan, Maryam, MD  pyridOXINE  (B-6) 50 MG tablet Take 1 tablet (50 mg total) by mouth daily. 02/28/23   Kathrin Mignon DASEN, MD  REPATHA  SURECLICK 140 MG/ML SOAJ INJECT 140 MGS SUBCUTANEOUSLY EVERY 14 DAYS 11/28/23   Croitoru, Mihai, MD  tamsulosin  (FLOMAX ) 0.4 MG CAPS capsule Take 0.4 mg by mouth daily in the afternoon.    [provider]  warfarin (COUMADIN ) 4 MG tablet Take 1 tablet (4 mg total) by mouth daily. 05/25/24   Bernadine Manos, MD    Family History  Problem Relation Age of Onset   CAD Father 64   Liver cancer Brother    CAD Sister      Social History   Tobacco Use   Smoking status: Former    Current packs/day: 0.00    Types: Cigarettes    Quit date: 10/18/1992    Years since quitting: 31.7   Smokeless tobacco: Never  Vaping Use   Vaping status: Never Used  Substance Use Topics   Alcohol use: No   Drug use: No    Allergies as of 07/23/2024 - Review Complete 07/16/2024  Allergen Reaction Noted   Codeine Hives 09/29/2011     Review of Systems:    All systems reviewed and negative except where noted in HPI.   Physical Exam:  There were no vitals taken for this visit. No LMP for male patient.  General:   Alert,  Well-developed, well-nourished, pleasant and cooperative in NAD Lungs:  Respirations even and unlabored.  Clear throughout to auscultation.   No wheezes, crackles, or rhonchi. No acute distress. Heart:  Regular rate and rhythm; no murmurs, clicks, rubs, or gallops. Abdomen:  Normal bowel sounds.  No bruits.  Soft, and non-distended without masses, hepatosplenomegaly or hernias noted.  No Tenderness.  No guarding or rebound tenderness.    Neurologic:  Alert and oriented x3;  grossly normal neurologically. Psych:  Alert and cooperative. Normal mood and affect.  Imaging Studies: No results found.  Labs: CBC    Component Value Date/Time  WBC 10.4 05/25/2024 0355   RBC 4.66 05/25/2024 0355   HGB 13.5 05/25/2024 0355   HGB 18.9 (H) 04/10/2024 1029   HGB 12.3 (L) 07/24/2014 1049   HCT 43.1 05/25/2024 0355   HCT 56.6 (H) 04/10/2024 1029   HCT 34.2 (L) 07/24/2014 1049   PLT 383 05/25/2024 0355   PLT 445 04/10/2024 1029   MCV 92.5 05/25/2024 0355   MCV 92 04/10/2024 1029   MCV 86.6 07/24/2014 1049    CMP     Component Value Date/Time   NA 135 05/24/2024 1057   NA 137 04/24/2024 1549   K 4.5 05/24/2024 1057   CL 100 05/24/2024 1057   CO2 23 05/24/2024 1057   GLUCOSE 160 (H) 05/24/2024 1057   BUN 19 05/24/2024 1057   BUN 25 04/24/2024 1549   CREATININE 1.30 (H) 05/24/2024 1057   CREATININE 1.56 (H) 09/17/2013 1135   CALCIUM  8.8 (L) 05/24/2024 1057   PROT 5.9 (L) 05/24/2024 0342   PROT 7.9 04/10/2024 1029   ALBUMIN 2.1 (L) 05/24/2024 0342   ALBUMIN 4.5 04/10/2024 1029   AST 38 05/24/2024 0342   ALT 26 05/24/2024 0342   ALKPHOS 58 05/24/2024 0342   BILITOT 0.6 05/24/2024 0342   BILITOT 0.6 04/10/2024 1029   GFRNONAA 56 (L) 05/24/2024 1057   GFRAA >60 08/14/2019 0429     Assessment and Plan:   Tony Zhang is a 78 y.o. y/o male has been referred for hospital follow-up of:  1.  Recurrent pancreatitis; s/p cholecystectomy 01/2024.  Evaluate for microlithiasis. - Schedule EUS - Lab lipase  2.  Moderate chronic gastritis and duodenitis - Continue pantoprazole  40 mg twice daily - Avoid NSAIDs  3.  Submucosal duodenal nodules - Schedule EUS for further evaluation  3.  Multiple comorbidities: history of CAD status post stent placement, status post cholecystectomy, AAA, DVT, aortic stenosis status post TAVR, A-fib on Coumadin .  - Consult with cardiology regarding Lovenox  bridging for EUS procedure  Follow up with Dr. Abran after EUS procedure  Ellouise Console, PA-C

## 2024-08-07 ENCOUNTER — Other Ambulatory Visit: Payer: Self-pay

## 2024-08-07 DIAGNOSIS — Z952 Presence of prosthetic heart valve: Secondary | ICD-10-CM

## 2024-09-08 ENCOUNTER — Other Ambulatory Visit: Payer: Self-pay | Admitting: Cardiovascular Disease

## 2024-10-25 ENCOUNTER — Other Ambulatory Visit: Payer: Self-pay | Admitting: Cardiovascular Disease

## 2024-10-31 ENCOUNTER — Other Ambulatory Visit: Payer: Self-pay | Admitting: Cardiovascular Disease

## 2024-11-02 NOTE — Progress Notes (Unsigned)
 " [facility]                                     Cardiology Office Note:    Date:  11/05/2024   ID:  Tony Zhang, DOB December 02, 1945, MRN [id number]  PCP:  Tony Sharper, MD  [facility] Cardiologist:  Tony Balding, MD  [facility] Structural Zhang: Zhang Wonda, MD [facility] Electrophysiologist:  None   Referring MD: Tony Mickey Browner, PA-C   1 year s/p TAVR  History of Present Illness:    Tony Zhang is a 79 y.o. 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   He has a history of CAD with NSTEMI with DES placement to the LAD in 05/2022. He had 80% oRCA and heavily calcified 75% OM3. 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    Echocardiogram in 07/2023 showed stable LVEF of 60 to 65%, normal RV function, no mitral valve disease, and worsening of his low-flow low gradient aortic stenosis with a mean gradient of 32 mmHg, AVA 0.72 cm2, DVI 0.18, SVI 28, and mild AI. [facility] 09/28/23 showed severe calcific stenosis of the RCA, unchanged from the previous cath, continued patency of the stented segment in the mid LAD with moderate nonobstructive plaquing in the proximal vessel, patent left circumflex with no high-grade stenosis, patent left subclavian artery with mild calcified stenosis and moderate pulmonary hypertension. He underwent successful TAVR with a 26 mm Edwards Sapien 3 Ultra Resilia THV via the TF approach on 11/09/23. Post operative echo showed EF 65%, normally functioning TAVR with a mean gradient of 5 mmHg and no PVL. Hospital course complicated by AKI 2/2 acute gallstone  pancreatitis as well as a subclavian seroma. He was noted to be hypoxic and discharged on home 02.  Had recurrent issues with gallstone pancreatitis requiring multiple admissions and underwent elective laparoscopic cholecystectomy on 01/17/24. Despite this readmitted for recurrent pancreatitis. Developed PAF and placed on amiodarone  which was later discontinued.    Today the patient presents to clinic for follow up. Here alone. No CP. Has chronic SOB that is unchanged. No LE edema, orthopnea or PND. He has dizziness from time to time but no syncope. Sometimes he gets blurry vision in one eye and black dots. Has ocular migraines. No blood in stool or urine. No palpitations. Continues to have some pain at the left subclavian access site where he had a seroma. Still collects antique cars and silver dollars-many are counterfeit.    Past Medical History:  Diagnosis Date   AAA (abdominal aortic aneurysm)    Blood dyscrasia    followed by Dr. Cloretta, for clotting issue, has been on Coumadin  for about 20 yrs.     CAD (coronary artery disease)    Chronic kidney disease    COPD (chronic obstructive pulmonary disease) (HCC)    Diabetes mellitus without complication (HCC)    DJD (degenerative joint disease)    Dyslipidemia    Dyspnea    Gout    Malignant hypertension    Peripheral vascular disease  Pneumonia 08/18/2013   pt. 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 Wonda and Dr. Lucas   Severe aortic stenosis    Venous thrombosis    Recurrent     Current Medications: Active Medications[1]    ROS:   Please see the history of present illness.    All other systems reviewed and are negative.  EKGs       Risk Assessment/Calculations:           Physical Exam:    VS:  BP  110/66   Pulse 68   Ht 6' 1 (1.854 m)   Wt 203 lb 9.6 oz (92.4 kg)   SpO2 94%   BMI 26.86 kg/m     Wt Readings from Last 3 Encounters:  11/05/24 203 lb 9.6 oz (92.4 kg)  07/16/24 203 lb (92.1 kg)  05/24/24 201 lb 8 oz (91.4 kg)     GEN: Well nourished, well developed in no acute distress NECK: No JVD CARDIAC: RRR, soft flow murmur @ RUSB, No rubs, gallops RESPIRATORY:  Clear to auscultation without rales, wheezing or rhonchi  ABDOMEN: Soft, non-tender, non-distended EXTREMITIES:  No edema; No deformity.   ASSESSMENT:    1. S/P TAVR (transcatheter aortic valve replacement)   2. Essential hypertension   3. SVT (supraventricular tachycardia)   4. Chronic Zhang failure with preserved ejection fraction (HFpEF) (HCC)   5. Stage 3 chronic kidney disease, unspecified whether stage 3a or 3b CKD (HCC)   6. Coronary artery disease involving native coronary artery of native Zhang without angina pectoris   7. PAD (peripheral artery disease)   8. Hypercoagulable state due to paroxysmal atrial fibrillation (HCC)   9. Aneurysm of ascending aorta without rupture     PLAN:    In order of problems listed above:  Severe AS s/p TAVR:  -- Echo today shows EF 55%, mild cLVH, mild RVE, mod pulm HTN, normally functioning TAVR with a mean gradient of 6 mm hg and no PVL.  -- NYHA class II symptoms.  -- Continue Coumadin .  -- SBE prophylaxis as indicated.  -- Continue regular follow up with Dr. Francyne.   HTN: -- BP well controlled.  -- Continue Toprol  XL 100 mg daily.    SVT:  -- Continue Toprol  XL 100 mg daily.    HFpEF:  -- Continue Lasix  20mg  daily.  -- Continue spiro 25mg  daily.    CKD stage IIIb:  -- Baseline creat 1.4-2.4.  -- Creat 1.32 on most recent labs (08/2024)   CAD:  -- s/p DES to LAD 2023.  -- Pre op [facility] 09/28/23 showed severe calcified stenosis of the RCA unchanged from prior in 2023, patent mid LAD stent with moderate nonobstructive disease proximally, patent  left circumflex, and patent subclavian with mild stenosis.  -- Plan for medical management of CAD.  -- Continue on atorvastatin  20mg  daily. No longer on Repatha  for unclear reasons (pt doesn't remember who stopped it or why).  -- No aspirin  given chronic OAC.    PAD:  -- Extensive PAD with known occlusion of the distal abdominal aorta, renal artery stenosis, celiac/mesenteric artery stenosis, and total RICA occlusion and 40-59% LICA occlusion.  -- Continue medical management with atorvastatin  20 mg daily.  -- No aspirin  given chronic OAC.  DVT/PE:  -- Continue Coumadin . INR followed by PCP in [place].    Aortic arch aneurysm:  -- CT chest abdomen done in 04/2024 showed 3.9 cm aneurysmal dilatation of the aortic arch. Recommend annual imaging followup by CTA or MRA. -- This will be followed over time.      Medication Adjustments/Labs and Tests Ordered: Current medicines are reviewed at length with the patient today.  Concerns regarding medicines are outlined above.  No orders of the defined types were placed in this encounter.  No orders of the defined types were placed in this encounter.   There are no Patient Instructions on file for this visit.   Signed, Lamarr Hummer, PA-C  11/05/2024 9:33 AM    [facility]     [1]  Current Meds  Medication Sig   acetaminophen  (TYLENOL ) 325 MG tablet Take 2 tablets (650 mg total) by mouth every 6 (six) hours as needed for mild pain (pain score 1-3) or fever (or Fever >/= 101).   albuterol  (VENTOLIN  HFA) 108 (90 Base) MCG/ACT inhaler Inhale 2 puffs into the lungs every 4 (four) hours as needed for wheezing or shortness of breath.   allopurinol  (ZYLOPRIM ) 300 MG tablet Take 300 mg by mouth daily.   atorvastatin  (LIPITOR) 20 MG tablet Take 20 mg by mouth daily.   budesonide -formoterol  (SYMBICORT ) 160-4.5 MCG/ACT inhaler Inhale 2 puffs into the lungs in the morning and at bedtime.   cyanocobalamin  (VITAMIN B12)  1000 MCG tablet Take 1,000 mcg by mouth See admin instructions. Take 1 tablet (1000mcg) by mouth on the 1st of every month.   empagliflozin  (JARDIANCE ) 10 MG TABS tablet TAKE 1 TABLET BY MOUTH DAILY   furosemide  (LASIX ) 20 MG tablet Take 1 tablet (20 mg total) by mouth daily as needed for edema (for 5lb weight gain, shortness of breath, or lower extremity edema). (Patient taking differently: Take 20 mg by mouth daily as needed for edema (for 5lb weight gain, shortness of breath, or lower extremity edema). Pt states he takes daily)   metoprolol  succinate (TOPROL -XL) 50 MG 24 hr tablet Take 1 tablet (50 mg total) by mouth daily. Take with or immediately following a meal.   ondansetron  (ZOFRAN ) 8 MG tablet Take 8 mg by mouth 2 (two) times daily as needed for vomiting or nausea.   pyridOXINE  (B-6) 50 MG tablet Take 1 tablet (50 mg total) by mouth daily.   spironolactone  (ALDACTONE ) 25 MG tablet Take 25 mg by mouth daily.   tamsulosin  (FLOMAX ) 0.4 MG CAPS capsule Take 0.4 mg by mouth daily in the afternoon.   warfarin (COUMADIN ) 4 MG tablet Take 1 tablet (4 mg total) by mouth daily.   "

## 2024-11-05 ENCOUNTER — Ambulatory Visit (HOSPITAL_COMMUNITY)
Admission: RE | Admit: 2024-11-05 | Discharge: 2024-11-05 | Disposition: A | Discharge status: Home / Self Care | Source: Ambulatory Visit | Attending: Internal Medicine | Admitting: Internal Medicine

## 2024-11-05 ENCOUNTER — Ambulatory Visit: Payer: Self-pay | Admitting: Physician Assistant

## 2024-11-05 ENCOUNTER — Ambulatory Visit: Payer: Medicare Other | Admitting: Physician Assistant

## 2024-11-05 ENCOUNTER — Other Ambulatory Visit (HOSPITAL_COMMUNITY): Payer: Medicare Other

## 2024-11-05 VITALS — BP 110/66 | HR 68 | Ht 73.0 in | Wt 203.6 lb

## 2024-11-05 DIAGNOSIS — I2510 Atherosclerotic heart disease of native coronary artery without angina pectoris: Secondary | ICD-10-CM | POA: Insufficient documentation

## 2024-11-05 DIAGNOSIS — I480 Paroxysmal atrial fibrillation: Secondary | ICD-10-CM

## 2024-11-05 DIAGNOSIS — I471 Supraventricular tachycardia, unspecified: Secondary | ICD-10-CM | POA: Insufficient documentation

## 2024-11-05 DIAGNOSIS — Z952 Presence of prosthetic heart valve: Secondary | ICD-10-CM

## 2024-11-05 DIAGNOSIS — I10 Essential (primary) hypertension: Secondary | ICD-10-CM | POA: Insufficient documentation

## 2024-11-05 DIAGNOSIS — I5032 Chronic diastolic (congestive) heart failure: Secondary | ICD-10-CM

## 2024-11-05 DIAGNOSIS — D6869 Other thrombophilia: Secondary | ICD-10-CM

## 2024-11-05 DIAGNOSIS — N183 Chronic kidney disease, stage 3 unspecified: Secondary | ICD-10-CM | POA: Diagnosis not present

## 2024-11-05 DIAGNOSIS — I739 Peripheral vascular disease, unspecified: Secondary | ICD-10-CM

## 2024-11-05 DIAGNOSIS — I7121 Aneurysm of the ascending aorta, without rupture: Secondary | ICD-10-CM | POA: Diagnosis not present

## 2024-11-05 LAB — ECHOCARDIOGRAM COMPLETE
AR max vel: 2.01 cm2
AV Area VTI: 2.12 cm2
AV Area mean vel: 2.09 cm2
AV Mean grad: 6 mmHg
AV Peak grad: 11.8 mmHg
Ao pk vel: 1.72 m/s
Area-P 1/2: 2.35 cm2
S' Lateral: 3.3 cm

## 2024-11-05 NOTE — Patient Instructions (Signed)
 Medication Instructions:   Your provider recommends that you continue on your current medications as directed. Please refer to the Current Medication list given to you today.  *If you need a refill on your cardiac medications before your next appointment, please call your pharmacy*  Lab Work: none If you have labs (blood work) drawn today and your tests are completely normal, you will receive your results only by: MyChart Message (if you have MyChart) OR A paper copy in the mail If you have any lab test that is abnormal or we need to change your treatment, we will call you to review the results.  Testing/Procedures: none  Follow-Up: At [facility], you and your health needs are our priority.  As part of our continuing mission to provide you with exceptional heart care, our providers are all part of one team.  This team includes your primary Cardiologist (physician) and Advanced Practice Providers or APPs (Physician Assistants and Nurse Practitioners) who all work together to provide you with the care you need, when you need it.  Your next appointment:    Please keep your follow up as scheduled.

## 2024-11-13 ENCOUNTER — Other Ambulatory Visit: Payer: Self-pay | Admitting: Cardiovascular Disease

## 2025-04-02 ENCOUNTER — Ambulatory Visit: Admitting: Cardiovascular Disease
